# Patient Record
Sex: Female | Born: 1955 | Race: White | Hispanic: No | Smoking: Never smoker
Health system: Southern US, Community
[De-identification: ages and names within clinical notes are randomized; demographics above are authoritative.]

## PROBLEM LIST (undated history)

## (undated) ENCOUNTER — Emergency Department (HOSPITAL_COMMUNITY): Payer: Self-pay

## (undated) DIAGNOSIS — N186 End stage renal disease: Secondary | ICD-10-CM

## (undated) DIAGNOSIS — G4730 Sleep apnea, unspecified: Secondary | ICD-10-CM

## (undated) DIAGNOSIS — M1990 Unspecified osteoarthritis, unspecified site: Secondary | ICD-10-CM

## (undated) DIAGNOSIS — I2510 Atherosclerotic heart disease of native coronary artery without angina pectoris: Secondary | ICD-10-CM

## (undated) DIAGNOSIS — K219 Gastro-esophageal reflux disease without esophagitis: Secondary | ICD-10-CM

## (undated) DIAGNOSIS — I10 Essential (primary) hypertension: Secondary | ICD-10-CM

## (undated) DIAGNOSIS — D649 Anemia, unspecified: Secondary | ICD-10-CM

## (undated) DIAGNOSIS — J45909 Unspecified asthma, uncomplicated: Secondary | ICD-10-CM

## (undated) DIAGNOSIS — I739 Peripheral vascular disease, unspecified: Secondary | ICD-10-CM

## (undated) DIAGNOSIS — E7800 Pure hypercholesterolemia, unspecified: Secondary | ICD-10-CM

## (undated) DIAGNOSIS — I639 Cerebral infarction, unspecified: Secondary | ICD-10-CM

## (undated) DIAGNOSIS — N184 Chronic kidney disease, stage 4 (severe): Secondary | ICD-10-CM

## (undated) DIAGNOSIS — Z87442 Personal history of urinary calculi: Secondary | ICD-10-CM

## (undated) DIAGNOSIS — Z8619 Personal history of other infectious and parasitic diseases: Secondary | ICD-10-CM

## (undated) DIAGNOSIS — E119 Type 2 diabetes mellitus without complications: Secondary | ICD-10-CM

## (undated) DIAGNOSIS — I509 Heart failure, unspecified: Secondary | ICD-10-CM

## (undated) DIAGNOSIS — Z992 Dependence on renal dialysis: Secondary | ICD-10-CM

## (undated) HISTORY — PX: TUBAL LIGATION: SHX77

## (undated) HISTORY — PX: SHOULDER OPEN ROTATOR CUFF REPAIR: SHX2407

## (undated) HISTORY — PX: HERNIA REPAIR: SHX51

## (undated) HISTORY — DX: Heart failure, unspecified: I50.9

---

## 2013-06-28 ENCOUNTER — Inpatient Hospital Stay (HOSPITAL_COMMUNITY)
Admission: AD | Admit: 2013-06-28 | Discharge: 2013-06-30 | DRG: 554 | Disposition: A | Discharge status: Home / Self Care | Payer: Self-pay | Source: Other Acute Inpatient Hospital | Attending: Internal Medicine | Admitting: Internal Medicine

## 2013-06-28 ENCOUNTER — Encounter (HOSPITAL_COMMUNITY): Payer: Self-pay | Admitting: Internal Medicine

## 2013-06-28 ENCOUNTER — Inpatient Hospital Stay (HOSPITAL_COMMUNITY): Payer: Self-pay

## 2013-06-28 DIAGNOSIS — M1990 Unspecified osteoarthritis, unspecified site: Principal | ICD-10-CM | POA: Diagnosis present

## 2013-06-28 DIAGNOSIS — N289 Disorder of kidney and ureter, unspecified: Secondary | ICD-10-CM

## 2013-06-28 DIAGNOSIS — I129 Hypertensive chronic kidney disease with stage 1 through stage 4 chronic kidney disease, or unspecified chronic kidney disease: Secondary | ICD-10-CM | POA: Diagnosis present

## 2013-06-28 DIAGNOSIS — E1110 Type 2 diabetes mellitus with ketoacidosis without coma: Secondary | ICD-10-CM

## 2013-06-28 DIAGNOSIS — Z6837 Body mass index (BMI) 37.0-37.9, adult: Secondary | ICD-10-CM

## 2013-06-28 DIAGNOSIS — N189 Chronic kidney disease, unspecified: Secondary | ICD-10-CM | POA: Diagnosis present

## 2013-06-28 DIAGNOSIS — E1310 Other specified diabetes mellitus with ketoacidosis without coma: Secondary | ICD-10-CM

## 2013-06-28 DIAGNOSIS — M25569 Pain in unspecified knee: Secondary | ICD-10-CM | POA: Diagnosis present

## 2013-06-28 DIAGNOSIS — IMO0002 Reserved for concepts with insufficient information to code with codable children: Secondary | ICD-10-CM | POA: Diagnosis present

## 2013-06-28 DIAGNOSIS — M898X9 Other specified disorders of bone, unspecified site: Secondary | ICD-10-CM | POA: Diagnosis present

## 2013-06-28 DIAGNOSIS — E669 Obesity, unspecified: Secondary | ICD-10-CM | POA: Diagnosis present

## 2013-06-28 DIAGNOSIS — E119 Type 2 diabetes mellitus without complications: Secondary | ICD-10-CM | POA: Diagnosis present

## 2013-06-28 DIAGNOSIS — E1165 Type 2 diabetes mellitus with hyperglycemia: Secondary | ICD-10-CM

## 2013-06-28 DIAGNOSIS — M79609 Pain in unspecified limb: Secondary | ICD-10-CM

## 2013-06-28 DIAGNOSIS — E785 Hyperlipidemia, unspecified: Secondary | ICD-10-CM | POA: Diagnosis present

## 2013-06-28 DIAGNOSIS — I739 Peripheral vascular disease, unspecified: Secondary | ICD-10-CM | POA: Diagnosis present

## 2013-06-28 HISTORY — DX: Essential (primary) hypertension: I10

## 2013-06-28 LAB — HEPARIN LEVEL (UNFRACTIONATED)
Heparin Unfractionated: 0.4 IU/mL (ref 0.30–0.70)
Heparin Unfractionated: 0.47 IU/mL (ref 0.30–0.70)

## 2013-06-28 LAB — CBC
HCT: 30.1 % — ABNORMAL LOW (ref 36.0–46.0)
Hemoglobin: 10.5 g/dL — ABNORMAL LOW (ref 12.0–15.0)
MCH: 31.3 pg (ref 26.0–34.0)
MCHC: 34.9 g/dL (ref 30.0–36.0)
MCV: 89.9 fL (ref 78.0–100.0)
Platelets: 348 10*3/uL (ref 150–400)
RBC: 3.35 MIL/uL — ABNORMAL LOW (ref 3.87–5.11)
RDW: 14.9 % (ref 11.5–15.5)
WBC: 8.4 10*3/uL (ref 4.0–10.5)

## 2013-06-28 LAB — BASIC METABOLIC PANEL
BUN: 48 mg/dL — ABNORMAL HIGH (ref 6–23)
CO2: 19 mEq/L (ref 19–32)
Calcium: 9.1 mg/dL (ref 8.4–10.5)
Chloride: 106 mEq/L (ref 96–112)
Creatinine, Ser: 2.1 mg/dL — ABNORMAL HIGH (ref 0.50–1.10)
GFR calc Af Amer: 29 mL/min — ABNORMAL LOW (ref >90)
GFR calc non Af Amer: 25 mL/min — ABNORMAL LOW (ref >90)
Glucose, Bld: 119 mg/dL — ABNORMAL HIGH (ref 70–99)
Potassium: 4.4 mEq/L (ref 3.5–5.1)
Sodium: 137 mEq/L (ref 135–145)

## 2013-06-28 LAB — GLUCOSE, CAPILLARY
Glucose-Capillary: 140 mg/dL — ABNORMAL HIGH (ref 70–99)
Glucose-Capillary: 110 mg/dL — ABNORMAL HIGH (ref 70–99)
Glucose-Capillary: 107 mg/dL — ABNORMAL HIGH (ref 70–99)
Glucose-Capillary: 118 mg/dL — ABNORMAL HIGH (ref 70–99)
Glucose-Capillary: 162 mg/dL — ABNORMAL HIGH (ref 70–99)

## 2013-06-28 LAB — HEMOGLOBIN A1C
Hgb A1c MFr Bld: 6.6 % — ABNORMAL HIGH (ref <5.7)
Mean Plasma Glucose: 143 mg/dL — ABNORMAL HIGH (ref <117)

## 2013-06-28 MED ORDER — DOCUSATE SODIUM 100 MG PO CAPS
100.0000 mg | ORAL_CAPSULE | Freq: Two times a day (BID) | ORAL | Status: DC
  Administered 2013-06-29 – 2013-06-30 (×3): 100 mg via ORAL
  Filled 2013-06-28 (×4): qty 1

## 2013-06-28 MED ORDER — ONDANSETRON HCL 4 MG/2ML IJ SOLN: 4.0000 mg | Freq: Four times a day (QID) | INTRAMUSCULAR | Status: DC | PRN

## 2013-06-28 MED ORDER — METOPROLOL TARTRATE 25 MG PO TABS
25.0000 mg | ORAL_TABLET | Freq: Two times a day (BID) | ORAL | Status: DC
  Administered 2013-06-28 – 2013-06-30 (×5): 25 mg via ORAL
  Filled 2013-06-28 (×6): qty 1

## 2013-06-28 MED ORDER — DEXTROSE-NACL 5-0.9 % IV SOLN
INTRAVENOUS | Status: DC
  Administered 2013-06-28 – 2013-06-29 (×2): via INTRAVENOUS

## 2013-06-28 MED ORDER — HEPARIN (PORCINE) IN NACL 100-0.45 UNIT/ML-% IJ SOLN
1300.0000 [IU]/h | INTRAMUSCULAR | Status: DC
  Administered 2013-06-28 – 2013-06-29 (×2): 1300 [IU]/h via INTRAVENOUS
  Filled 2013-06-28 (×3): qty 250

## 2013-06-28 MED ORDER — HYDROMORPHONE HCL PF 1 MG/ML IJ SOLN
1.0000 mg | INTRAMUSCULAR | Status: DC | PRN
  Administered 2013-06-28 – 2013-06-29 (×3): 1 mg via INTRAVENOUS
  Filled 2013-06-28 (×3): qty 1

## 2013-06-28 MED ORDER — ONDANSETRON HCL 4 MG PO TABS: 4.0000 mg | ORAL_TABLET | Freq: Four times a day (QID) | ORAL | Status: DC | PRN

## 2013-06-28 MED ORDER — ASPIRIN EC 81 MG PO TBEC
81.0000 mg | DELAYED_RELEASE_TABLET | Freq: Every day | ORAL | Status: DC
  Administered 2013-06-28 – 2013-06-30 (×3): 81 mg via ORAL
  Filled 2013-06-28 (×3): qty 1

## 2013-06-28 MED ORDER — INSULIN ASPART 100 UNIT/ML ~~LOC~~ SOLN
0.0000 [IU] | SUBCUTANEOUS | Status: DC
  Administered 2013-06-28: 2 [IU] via SUBCUTANEOUS

## 2013-06-28 MED ORDER — ATORVASTATIN CALCIUM 40 MG PO TABS
40.0000 mg | ORAL_TABLET | Freq: Every day | ORAL | Status: DC
  Administered 2013-06-28 – 2013-06-29 (×2): 40 mg via ORAL
  Filled 2013-06-28 (×3): qty 1

## 2013-06-28 NOTE — Care Management Note (Unsigned)
    Page 1 of 1   06/29/2013     3:29:54 PM   CARE MANAGEMENT NOTE 06/29/2013  Patient:  ALETTE, SCHAFER PENDELL   Account Number:  [id number]  Date Initiated:  06/28/2013  Documentation initiated by:  Zaneta Lightcap  Subjective/Objective Assessment:   PT ADM ON 06/28/13 WITH ARTERIAL OCCLUSION.  PTA, PT INDEPENDENT, LIVES WITH FAMILY MEMBERS.     Action/Plan:   WILL FOLLOW FOR DISCHARGE NEEDS AS PT PROGRESSES.   Anticipated DC Date:  07/01/2013   Anticipated DC Plan:  [place]  CM consult      Choice offered to / List presented to:             Status of service:  In process, will continue to follow Medicare Important Message given?   (If response is "NO", the following Medicare IM given date fields will be blank) Date Medicare IM given:   Date Additional Medicare IM given:    Discharge Disposition:    Per UR Regulation:  Reviewed for med. necessity/level of care/duration of stay  If discussed at [place] of Stay Meetings, dates discussed:    Comments:  06/29/13 Nafisa Olds,RN,BSN [id number] MET WITH PT TO [place].  PT REQUESTS RW FOR HOME.  WILL OBTAIN ORDER FROM MD AND REFER TO [place] FOR DME NEEDS.

## 2013-06-28 NOTE — Consult Note (Addendum)
[facility] NOTE   MRN : [id number]  Reason for Consult: left knee pain that radiates to left calf.  R/O claudication Referring Physician: Orvan Falconer, MD   History of Present Illness: Kristen Berry is an 57 y.o. female with hx of DM2, previously on Metformin but stopped due to "kidney problem", HTN, obesity px cc: acute onset of severe L knee pain.     She has had left leg pain that started 3 weeks ago.  Her sx is consistent with short distance intermittent claudication.  She had sudden onset of severe pain behind her left knee yesterday while standing.  The pain was so severe yesterday that she was unable to bear weight on it and she went to the [facility] ER.  She had a venous doppler performed and there is no evidence of DVT, but questionable arterial occlusion.  The report was not scanned in the chart.  The patient states her worse pain is in her knee and the pain radiates to her lower leg.  She does not have classic symptoms of claudication.  We have been asked to consult on her and rule out PAD/occlusive disease.  Past Medical History  Diagnosis Date  . Hypertension   . Chronic kidney disease    Past surgical history:Tubal ligation, left rotator cuff repair  Social History History  Substance Use Topics  . Smoking status: Never Smoker   . Smokeless tobacco: Not on file  . Alcohol Use: No   Family History Father DM, HTN Mother CAD  Outpatient Medications  She does not know the name of her current medications and will have family bring a list.   Current Facility-Administered Medications  Medication Dose Route Frequency Provider Last Rate Last Dose  . aspirin EC tablet 81 mg  81 mg Oral Daily Orvan Falconer, MD      . atorvastatin (LIPITOR) tablet 40 mg  40 mg Oral q1800 Orvan Falconer, MD      . dextrose 5 %-0.9 % sodium chloride infusion   Intravenous Continuous Orvan Falconer, MD 50 mL/hr at 06/28/13 0534    . docusate sodium (COLACE) capsule 100  mg  100 mg Oral BID Orvan Falconer, MD      . heparin ADULT infusion 100 units/mL (25000 units/250 mL)  1,300 Units/hr Intravenous Continuous Rogue Bussing, [facility] 13 mL/hr at 06/28/13 0534 1,300 Units/hr at 06/28/13 0534  . HYDROmorphone (DILAUDID) injection 1 mg  1 mg Intravenous Q2H PRN Orvan Falconer, MD      . insulin aspart (novoLOG) injection 0-15 Units  0-15 Units Subcutaneous Q4H Orvan Falconer, MD   2 Units at 06/28/13 0545  . metoprolol tartrate (LOPRESSOR) tablet 25 mg  25 mg Oral BID Orvan Falconer, MD      . ondansetron [facility]) tablet 4 mg  4 mg Oral Q6H PRN Orvan Falconer, MD       Or  . ondansetron [facility]) injection 4 mg  4 mg Intravenous Q6H PRN Orvan Falconer, MD      Pt meds include: Statin :No Betablocker: No ASA: No Other anticoagulants/antiplatelets: no  Allergies: NKDA  REVIEW OF SYSTEMS  General: [ ]  Weight loss, [ ]  Fever, [ ]  chills Neurologic: [ ]  Dizziness, [ ]  Blackouts, [ ]  Seizure, [ ]  Stroke, [ ]  "Mini stroke", [ ]  Slurred speech, [ ]  Temporary blindness; [ ]  weakness in arms or legs, [ ]  Hoarseness [ ]  Dysphagia Cardiac: [ ]  Chest pain/pressure, [ ]  Shortness of breath  at rest [ ]  Shortness of breath with exertion, [ ]  Atrial fibrillation or irregular heartbeat  Vascular: [x ] Pain in legs with walking, [ ]  Pain in legs at rest, [ ]  Pain in legs at night,  [ ]  Non-healing ulcer, [ ]  Blood clot in vein/DVT,   Pulmonary: [ ]  Home oxygen, [x ] Productive cough, [ ]  Coughing up blood, [ ]  Asthma, [ ]  Wheezing [ ]  COPD Musculoskeletal:  [ ]  Arthritis, [ ]  Low back pain, [x ] Joint pain Hematologic: [x ] Easy Bruising, [ ]  Anemia; [ ]  Hepatitis Gastrointestinal: [ ]  Blood in stool, [ ]  Gastroesophageal Reflux/heartburn, Urinary: [x ] chronic Kidney disease ?, [ ]  on HD - [ ]  MWF or [ ]  TTHS, [ ]  Burning with urination, [ ]  Difficulty urinating Skin: [ ]  Rashes, [x ] Wounds superficial scratches. Psychological: [ ]  Anxiety, [ ]  Depression  Physical Examination Filed Vitals:   06/28/13  0410 06/28/13 0412  BP: 184/51 159/51  Pulse: 99   Temp: 97.4 F (36.3 C)   TempSrc: Oral   Resp: 17   Height: 5\' 7"  (1.702 m)   Weight: 239 lb (108.41 kg)   SpO2: 96%    Body mass index is 37.42 kg/(m^2).  General:  WDWN in NAD, obese  Gait: unable secondary to left knee pain Head: Vance/AT Eyes: PERRLA, EOMI ENT: WNL, oroph. Without erythema or exudate, no cervical LAD Pulmonary: normal non-labored breathing , without Rales, rhonchi,  wheezing Cardiac: RRR, without  Murmurs, rubs or gallops; No carotid bruits Abdomen: soft, NT, no masses Skin: no rashes, ulcers noted;  no Gangrene , no cellulitis;  Superficial  wounds;  Vascular Exam/Pulses:Radial, brachial, femoral palpable pulses. Doppler DP and PT signal bilateral Musculoskeletal: no muscle wasting or atrophy; no edema, No erythema or point tenderness, generalized left knee pain. Neurologic: A&O X 3; Appropriate Affect ; SENSATION: normal; MOTOR FUNCTION: 5/5 Symmetric, except weakness in left knee secondary to pain with resistive AROM. Speech is fluent/normal Lymph: no femoral or axillary LAD Psych: judgment intact, mood appropriate for clinical situation  Laboratory: CBC: No results found for this basename: wbc, rbc, hgb, hct, plt, mcv, mch, mchc, rdw, neutrabs, lymphsabs, monoabs, eosabs, basosabs    BMP:    Component Value Date/Time   NA 137 06/28/2013 0830   K 4.4 06/28/2013 0830   CL 106 06/28/2013 0830   CO2 19 06/28/2013 0830   GLUCOSE 119* 06/28/2013 0830   BUN 48* 06/28/2013 0830   CREATININE 2.10* 06/28/2013 0830   CALCIUM 9.1 06/28/2013 0830   GFRNONAA 25* 06/28/2013 0830   GFRAA 29* 06/28/2013 0830    Coagulation: No results found for this basename: INR, PROTIME   No results found for this basename: PTT   Radiology: Dg Knee 2 Views Left  06/28/2013   CLINICAL DATA:  Unable to bear weight  EXAM: LEFT KNEE - 1-2 VIEW  COMPARISON:  None.  FINDINGS: Frontal and lateral views were obtained. There is no fracture,  dislocation, or effusion. There is moderate narrowing medially and in the patellofemoral joint region. There is mild spurring laterally and arising from the posterior patella. No erosive change.  IMPRESSION: Osteoarthritis. No fracture or effusion.   Electronically Signed   By: Lowella Grip   On: 06/28/2013 09:19   Non-Invasive Vascular Imaging: Pending ABI's  ASSESSMENT/PLAN:  Left knee pain of unknown cause , BLE intermittent claudication  Doppler signals intact.  We will order ABI's and left knee x-ray for further work  up.  Pending labs.  Laurence Slate [facility] 06/28/2013 7:40 AM  Addendum  I have independently interviewed and examined the patient, and I agree with the physician assistant's findings.  The patient's findings are consistent with intermittent claudication.  The patient has no evidence of threatened limb with intact neuro function and dopplerable signals in both feet.  The chronology and findings in this patient are not consistent with her intermittent claudication as the cause of her acute left knee pain.  I think screening for possible embolism and popliteal aneurysm with a left arterial duplex is not unreasonable along with the ABI.  If the non-invasive studies are not diagnostic, pt might need an ORTHO evaluation for a non-vascular etiology.  Adele Barthel, MD [facility] [place] Office: [phone] Pager: [phone]  06/28/2013, 11:26 AM

## 2013-06-28 NOTE — Progress Notes (Signed)
Utilization Review Completed.Donne Anon T10/10/2012

## 2013-06-28 NOTE — Progress Notes (Signed)
ANTICOAGULATION CONSULT NOTE - Follow Up Consult  Pharmacy Consult for Heparin  Indication: r/o arterial occlusion   No Known Allergies  Patient Measurements: Height: 5\' 7"  (170.2 cm) Weight: 239 lb (108.41 kg) IBW/kg (Calculated) : 61.6 Heparin Dosing Weight: 90 kg  Vital Signs: Temp: 97.4 F (36.3 C) (10/02 0410) Temp src: Oral (10/02 0410) BP: 157/35 mmHg (10/02 1044) Pulse Rate: 79 (10/02 1044)  Labs:  Recent Labs  06/28/13 0830 06/28/13 1446  HGB  --  10.5*  HCT  --  30.1*  PLT  --  348  HEPARINUNFRC  --  0.40  CREATININE 2.10*  --     Estimated Creatinine Clearance: 37 ml/min (by C-G formula based on Cr of 2.1).   Medications:  -Heparin at 1300 units/hr  Assessment: 57 y/o F on heparin drip for r/o arterial occlusion. HL is 0.40 on 1300 units/hr of heparin. Hgb 10.5, Plts 348, Scr 2.10 with CrCl ~35-40, no overt bleeding noted.   Goal of Therapy:  Heparin level 0.3-0.7 units/ml Monitor platelets by anticoagulation protocol: Yes   Plan:  -Continue heparin at 1300 units/hr -8 hour HL at 2300 -Daily CBC/HL -Monitor for bleeding -F/U VVS plans  Thank you for allowing me to take part in this patient's care,  Narda Bonds, PharmD Clinical Pharmacist Phone: [phone] Pager: [phone] 06/28/2013 3:26 PM

## 2013-06-28 NOTE — Progress Notes (Addendum)
VASCULAR LAB PRELIMINARY  PRELIMINARY  PRELIMINARY  PRELIMINARY  Left lower extremity duplex completed.    Preliminary report:  Left: Monophasic flow from the CFA throughout.  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  Strong collateral at this level.  Reconstitution in the distal FA with a mild stenosis.  Popliteal artery patent with no aneurysm.  Right:  CFA monophasic.  Mickael Mcnutt, RVT 06/28/2013, 2:59 PM  ABI completed:    RIGHT    LEFT    PRESSURE WAVEFORM  PRESSURE WAVEFORM  BRACHIAL 221 Triphasic  BRACHIAL 186 Triphasic   DP   DP    AT 144 Biphasic   AT 91 Monophasic  PT 119 Monophasic  PT 49--may be higher, barely audible  Monophasic   PER   PER    GREAT TOE  NA GREAT TOE  NA    RIGHT LEFT  ABI 0.65 0.41     Daralyn Bert, RVT 06/29/2013, 11:42 AM

## 2013-06-28 NOTE — H&P (Signed)
[facility] History and Physical  Kristen Berry [id number] DOB: October 21, 1955    PCP:   Rise Patience., MD   Chief Complaint: acute left lower leg pain 3 weeks ago.  HPI: Kristen Berry is an 57 y.o. female with hx of DM2, previously on Metformin but stopped due to "kidney problem", HTN, obesity, presents to [facility] ER complaining of sudden onset of left lower leg pain acutely 3 weeks ago while walking in a grocery store.  Since then, pain has been persisting, worse on ambulation, but also having rest pain.  She didn't have any lower back or buttock pain.  There has been no fever or chills.  She said her pain was mild to moderate, but tonight it was more severe.  There has been no chest pain.  She went to [facility] ER, where a venous doppler was done showing no DVT, but querry arterial occlusion.  Other study there included an EKG showing NSR and unremarkable.  Her Cr was elevated to 2.0 with BUN 47.  Her WBC was 11K, Hb 11.5 grams per DL. Her BS was 231, K 5.3 and Bicarb of 16.  Dr Bridgett Larsson of vascular surgery was consulted, recommended out patient follow up if able to control pain.  Hospitalist was requested to accept her in transfers for inpatient treatment.  Doppler of her leg showed the presence of both DP and PT pulses.  Rewiew of Systems:  Constitutional: Negative for malaise, fever and chills. No significant weight loss or weight gain Eyes: Negative for eye pain, redness and discharge, diplopia, visual changes, or flashes of light. ENMT: Negative for ear pain, hoarseness, nasal congestion, sinus pressure and sore throat. No headaches; tinnitus, drooling, or problem swallowing. Cardiovascular: Negative for chest pain, palpitations, diaphoresis, dyspnea and peripheral edema. ; No orthopnea, PND Respiratory: Negative for cough, hemoptysis, wheezing and stridor. No pleuritic chestpain. Gastrointestinal: Negative for nausea, vomiting, diarrhea, constipation,  abdominal pain, melena, blood in stool, hematemesis, jaundice and rectal bleeding.    Genitourinary: Negative for frequency, dysuria, incontinence,flank pain and hematuria; Musculoskeletal: Negative for back pain and neck pain. Negative for swelling and trauma.;  Skin: . Negative for pruritus, rash, abrasions, bruising and skin lesion.; ulcerations Neuro: Negative for headache, lightheadedness and neck stiffness. Negative for weakness, altered level of consciousness , altered mental status, extremity weakness, burning feet, involuntary movement, seizure and syncope.  Psych: negative for anxiety, depression, insomnia, tearfulness, panic attacks, hallucinations, paranoia, suicidal or homicidal ideation    Past Medical History  Diagnosis Date  . Hypertension   . Chronic kidney disease     No past surgical history on file.  Medications:  HOME MEDS: Prior to Admission medications   Not on File     Allergies:  Allergies not on file  Social History:   reports that she has never smoked. She does not have any smokeless tobacco history on file. She reports that she does not drink alcohol. Her drug history is not on file.  Family History: No family history on file.   Physical Exam: Filed Vitals:   06/28/13 0410 06/28/13 0412  BP: 184/51 159/51  Pulse: 99   Temp: 97.4 F (36.3 C)   TempSrc: Oral   Resp: 17   Height: 5\' 7"  (1.702 m)   Weight: 108.41 kg (239 lb)   SpO2: 96%    Blood pressure 159/51, pulse 99, temperature 97.4 F (36.3 C), temperature source Oral, resp. rate 17, height 5\' 7"  (1.702 m), weight 108.41 kg (239 lb),  SpO2 96.00%.  GEN:  Pleasant  patient lying in the stretcher in no acute distress; cooperative with exam. PSYCH:  alert and oriented x4; does not appear anxious or depressed; affect is appropriate. HEENT: Mucous membranes pink and anicteric; PERRLA; EOM intact; no cervical lymphadenopathy nor thyromegaly or carotid bruit; no JVD; There were no stridor.  Neck is very supple. Breasts:: Not examined CHEST WALL: No tenderness CHEST: Normal respiration, clear to auscultation bilaterally.  HEART: Regular rate and rhythm.  There are no murmur, rub, or gallops.   BACK: No kyphosis or scoliosis; no CVA tenderness ABDOMEN: soft and non-tender; no masses, no organomegaly, normal abdominal bowel sounds; no pannus; no intertriginous candida. There is no rebound and no distention. Rectal Exam: Not done EXTREMITIES: No bone or joint deformity; age-appropriate arthropathy of the hands and knees; no edema; no ulcerations.  She has dorsalis pedis by my exam.  Her leg is warm except at the very tips of her left toes, a little cool, but not cold.  No discoloration. Genitalia: not examined PULSES: 2+ and symmetric SKIN: Normal hydration no rash or ulceration CNS: Cranial nerves 2-12 grossly intact no focal lateralizing neurologic deficit.  Speech is fluent; uvula elevated with phonation, facial symmetry and tongue midline. DTR are normal bilaterally, cerebella exam is intact, barbinski is negative and strengths are equaled bilaterally.  No sensory loss.   Labs on Admission:  See above, Labs done at [facility] ER, not repeated.  Radiological Exams on Admission: No results found.  EKG: Independently reviewed. Done at [employer], [place], no acute ST-T changes.   Assessment/Plan Present on Admission:  . Renal insufficiency . Other and unspecified hyperlipidemia . Obesity, unspecified . Pain in limb . Type II or unspecified type diabetes mellitus with unspecified complication, uncontrolled  PLAN:  It is possible that she has arterial occlusion, picked up by venous doppler of her lower leg.  She is not having severe pain, and her left foot is quite warm, but toes are a little cool.  Her symptoms have been present for at least 3 weeks, though it was abrupt when it started 3 weeks ago.  Her elevated Cr also accompanied with elevated BUN as well, along with her being on  diuretic made we wonder if she is volume depleted.  Will admit her and start IVF.  Follow Cr. I will stop her Zesteretic.  Will start her on IV Heparin as she has no risk of bleeding.  She needs pain medication. Will notify Dr Bridgett Larsson of her admission.  I have continue her lopressor and her statin.  She is made NPO, given D5 NS and will use SSI for elevated BS.  She is stable, full code, and will be admitted to [facility] service.  Thank you for allowing to participate in the care of this nice patient.  Other plans as per orders.  Code Status: FULL Haskel Khan, MD. [facility] Pager [phone] 7pm to 7am.  06/28/2013, 5:01 AM

## 2013-06-28 NOTE — Progress Notes (Signed)
Patient admitted early this AM by Dr. Einar Crow see H&P.  No labs here.  Will get basic labs and await vascular surgery consultation.  Eulogio Bear DO

## 2013-06-28 NOTE — Progress Notes (Signed)
ANTICOAGULATION CONSULT NOTE - Initial Consult  Pharmacy Consult for heparin Indication: r/o arterial occlusion  Patient Measurements: Height: 5\' 7"  (170.2 cm) Weight: 239 lb (108.41 kg) IBW/kg (Calculated) : 61.6 Heparin Dosing Weight: 90kg  Vital Signs: Temp: 97.4 F (36.3 C) (10/02 0410) Temp src: Oral (10/02 0410) BP: 159/51 mmHg (10/02 0412) Pulse Rate: 99 (10/02 0410)   Medical History: Past Medical History  Diagnosis Date  . Hypertension   . Chronic kidney disease     Medications:  Scheduled:  . aspirin EC  81 mg Oral Daily  . atorvastatin  40 mg Oral q1800  . docusate sodium  100 mg Oral BID  . insulin aspart  0-15 Units Subcutaneous Q4H  . metoprolol tartrate  25 mg Oral BID    Assessment: 57yo female comes from [facility] for leg pain, possibly d/t arterial occlusion, to continue gtt already started. Goal of Therapy:  Heparin level 0.3-0.7 units/ml Monitor platelets by anticoagulation protocol: Yes   Plan:  Started on heparin 1100 units/hr at OSH without bolus, gtt has been off x~97min since arriving to [facility]; will resume at 1300 units/hr and monitor heparin levels and CBC.  Wynona Neat, PharmD, BCPS  06/28/2013,4:59 AM

## 2013-06-29 DIAGNOSIS — M79609 Pain in unspecified limb: Secondary | ICD-10-CM

## 2013-06-29 DIAGNOSIS — E785 Hyperlipidemia, unspecified: Secondary | ICD-10-CM

## 2013-06-29 DIAGNOSIS — I739 Peripheral vascular disease, unspecified: Secondary | ICD-10-CM

## 2013-06-29 LAB — BASIC METABOLIC PANEL
BUN: 44 mg/dL — ABNORMAL HIGH (ref 6–23)
CO2: 20 mEq/L (ref 19–32)
Calcium: 9.2 mg/dL (ref 8.4–10.5)
Chloride: 106 mEq/L (ref 96–112)
Creatinine, Ser: 2.13 mg/dL — ABNORMAL HIGH (ref 0.50–1.10)
GFR calc Af Amer: 28 mL/min — ABNORMAL LOW (ref >90)
GFR calc non Af Amer: 24 mL/min — ABNORMAL LOW (ref >90)
Glucose, Bld: 132 mg/dL — ABNORMAL HIGH (ref 70–99)
Potassium: 4.9 mEq/L (ref 3.5–5.1)
Sodium: 137 mEq/L (ref 135–145)

## 2013-06-29 LAB — CBC
HCT: 33.3 % — ABNORMAL LOW (ref 36.0–46.0)
Hemoglobin: 10.9 g/dL — ABNORMAL LOW (ref 12.0–15.0)
MCH: 30.1 pg (ref 26.0–34.0)
MCHC: 32.7 g/dL (ref 30.0–36.0)
MCV: 92 fL (ref 78.0–100.0)
Platelets: 425 10*3/uL — ABNORMAL HIGH (ref 150–400)
RBC: 3.62 MIL/uL — ABNORMAL LOW (ref 3.87–5.11)
RDW: 15 % (ref 11.5–15.5)
WBC: 9.7 10*3/uL (ref 4.0–10.5)

## 2013-06-29 LAB — GLUCOSE, CAPILLARY: Glucose-Capillary: 121 mg/dL — ABNORMAL HIGH (ref 70–99)

## 2013-06-29 LAB — HEPARIN LEVEL (UNFRACTIONATED): Heparin Unfractionated: 0.51 IU/mL (ref 0.30–0.70)

## 2013-06-29 MED ORDER — LIDOCAINE HCL (PF) 1 % IJ SOLN
5.0000 mL | Freq: Once | INTRAMUSCULAR | Status: AC
  Administered 2013-06-29: 5 mL
  Filled 2013-06-29: qty 5

## 2013-06-29 MED ORDER — HYDROCODONE-ACETAMINOPHEN 5-325 MG PO TABS
1.0000 | ORAL_TABLET | ORAL | Status: DC | PRN
  Administered 2013-06-29: 2 via ORAL
  Filled 2013-06-29: qty 2

## 2013-06-29 MED ORDER — HYDRALAZINE HCL 20 MG/ML IJ SOLN
10.0000 mg | Freq: Four times a day (QID) | INTRAMUSCULAR | Status: DC | PRN
  Administered 2013-06-29: 10 mg via INTRAVENOUS
  Filled 2013-06-29 (×2): qty 1

## 2013-06-29 MED ORDER — TRIAMCINOLONE ACETONIDE 40 MG/ML IJ SUSP
40.0000 mg | Freq: Once | INTRAMUSCULAR | Status: AC
  Administered 2013-06-29: 40 mg via INTRA_ARTICULAR
  Filled 2013-06-29: qty 1

## 2013-06-29 MED ORDER — LIDOCAINE 5 % EX PTCH
1.0000 | MEDICATED_PATCH | CUTANEOUS | Status: DC
  Administered 2013-06-29 – 2013-06-30 (×2): 1 via TRANSDERMAL
  Filled 2013-06-29 (×3): qty 1

## 2013-06-29 MED ORDER — HEPARIN SODIUM (PORCINE) 5000 UNIT/ML IJ SOLN
5000.0000 [IU] | Freq: Three times a day (TID) | INTRAMUSCULAR | Status: DC
  Administered 2013-06-29 – 2013-06-30 (×3): 5000 [IU] via SUBCUTANEOUS
  Filled 2013-06-29 (×6): qty 1

## 2013-06-29 MED ORDER — BUPIVACAINE HCL (PF) 0.5 % IJ SOLN
30.0000 mL | Freq: Once | INTRAMUSCULAR | Status: AC
  Administered 2013-06-29: 30 mL
  Filled 2013-06-29: qty 30

## 2013-06-29 MED ORDER — BUPIVACAINE HCL (PF) 0.5 % IJ SOLN
20.0000 mL | Freq: Once | INTRAMUSCULAR | Status: DC
  Filled 2013-06-29: qty 20

## 2013-06-29 NOTE — Progress Notes (Signed)
ANTICOAGULATION CONSULT NOTE - Follow Up Consult  Pharmacy Consult for Heparin  Indication: r/o arterial occlusion   No Known Allergies  Patient Measurements: Height: 5\' 7"  (170.2 cm) Weight: 239 lb (108.41 kg) IBW/kg (Calculated) : 61.6 Heparin Dosing Weight: 90 kg  Vital Signs: Temp: 97.5 F (36.4 C) (10/02 2016) Temp src: Oral (10/02 2016) BP: 138/56 mmHg (10/02 2016) Pulse Rate: 72 (10/02 2016)  Labs:  Recent Labs  06/28/13 0830 06/28/13 1446 06/28/13 2330  HGB  --  10.5*  --   HCT  --  30.1*  --   PLT  --  348  --   HEPARINUNFRC  --  0.40 0.47  CREATININE 2.10*  --   --     Estimated Creatinine Clearance: 37 ml/min (by C-G formula based on Cr of 2.1).   Medications:  -Heparin at 1300 units/hr  Assessment: 57 y/o F on heparin drip for r/o arterial occlusion. HL is 0.47 on 1300 units/hr of heparin. Hgb 10.5, Plts 348, Scr 2.10 with CrCl ~35-40, no overt bleeding noted.   Goal of Therapy:  Heparin level 0.3-0.7 units/ml Monitor platelets by anticoagulation protocol: Yes   Plan:  -Continue heparin at 1300 units/hr -f/u daily CBC/HL -Monitor for bleeding -F/U VVS plans  Thank you for allowing me to take part in this patient's care,  Excell Seltzer, PharmD Clinical Pharmacist Phone: [phone] 06/29/2013 12:00 AM

## 2013-06-29 NOTE — Progress Notes (Addendum)
[facility] PROGRESS NOTE  Kristen Berry [id number] DOB: 17-Nov-1955 DOA: 06/28/2013 PCP: No primary provider on file.  Assessment/Plan: Left knee pain- appears to osteoarthritis- was suspicious for arterial clot but seen by vascular: LLE arterial duplex more consistent with CHRONIC disease collateral development already evident. I doubt her intermittent claudication caused her acute pain in Left leg. Intermittent claudication is managed by maximal medical management including walk plan initially. She follow up in the office in 3 months for continued surveillance. I would obtain the ABI before discharge to set her baseline. -will add lidocaine patch -change to oral pain meds -asked ortho to see -try shot of cortisone  Renal insuff  Obesity    Code Status: full Family Communication: patient Disposition Plan: ?d/c soon   Consultants: Vascular  Procedures:    Antibiotics:    HPI/Subjective: Up walking to bathroom Knee hurts with palpation and weight bearing  Objective: Filed Vitals:   06/29/13 0444  BP: 137/50  Pulse: 79  Temp: 97.5 F (36.4 C)  Resp: 18    Intake/Output Summary (Last 24 hours) at 06/29/13 0907 Last data filed at 06/29/13 0730  Gross per 24 hour  Intake    720 ml  Output      0 ml  Net    720 ml   Filed Weights   06/28/13 0410  Weight: 108.41 kg (239 lb)    Exam:   General:  A+Ox3, NAD  Cardiovascular: rrr  Respiratory: clear anterior  Abdomen: +Bs, soft  Musculoskeletal: all ligaments/tendons in left knee seem intact- pain with palpation- resembles fibro  Data Reviewed: Basic Metabolic Panel:  Recent Labs Lab 06/28/13 0830 06/29/13 0540  NA 137 137  K 4.4 4.9  CL 106 106  CO2 19 20  GLUCOSE 119* 132*  BUN 48* 44*  CREATININE 2.10* 2.13*  CALCIUM 9.1 9.2   Liver Function Tests: No results found for this basename: AST, ALT, ALKPHOS, BILITOT, PROT, ALBUMIN,  in the last 168 hours No results found  for this basename: LIPASE, AMYLASE,  in the last 168 hours No results found for this basename: AMMONIA,  in the last 168 hours CBC:  Recent Labs Lab 06/28/13 1446 06/29/13 0540  WBC 8.4 9.7  HGB 10.5* 10.9*  HCT 30.1* 33.3*  MCV 89.9 92.0  PLT 348 425*   Cardiac Enzymes: No results found for this basename: CKTOTAL, CKMB, CKMBINDEX, TROPONINI,  in the last 168 hours BNP (last 3 results) No results found for this basename: PROBNP,  in the last 8760 hours CBG:  Recent Labs Lab 06/28/13 0822 06/28/13 1120 06/28/13 1615 06/28/13 2126 06/29/13 0622  GLUCAP 110* 107* 118* 162* 121*    No results found for this or any previous visit (from the past 240 hour(s)).   Studies: Dg Knee 2 Views Left  06/28/2013   CLINICAL DATA:  Unable to bear weight  EXAM: LEFT KNEE - 1-2 VIEW  COMPARISON:  None.  FINDINGS: Frontal and lateral views were obtained. There is no fracture, dislocation, or effusion. There is moderate narrowing medially and in the patellofemoral joint region. There is mild spurring laterally and arising from the posterior patella. No erosive change.  IMPRESSION: Osteoarthritis. No fracture or effusion.   Electronically Signed   By: Lowella Grip   On: 06/28/2013 09:19    Scheduled Meds: . aspirin EC  81 mg Oral Daily  . atorvastatin  40 mg Oral q1800  . docusate sodium  100 mg Oral BID  . lidocaine  1 patch Transdermal Q24H  . metoprolol tartrate  25 mg Oral BID   Continuous Infusions: . dextrose 5 % and 0.9% NaCl 50 mL/hr at 06/29/13 0030    Principal Problem:   Pain in limb Active Problems:   Renal insufficiency   Other and unspecified hyperlipidemia   Obesity, unspecified   Type II or unspecified type diabetes mellitus with unspecified complication, uncontrolled    Time spent: [place], [facility] Pager [phone]. If 7PM-7AM, please contact night-coverage at [email or web address], password [facility] 06/29/2013, 9:07 AM  LOS: 1 day

## 2013-06-29 NOTE — Progress Notes (Signed)
Pt's b/p assessed, B/p 206/77 in right arm with pt laying in bed., B/p 196/77 in left arm with patient laying in bed. Pt's B/P in left arm sitting 183/87 at 2108. MD notified.    Quisha Mabie,MSN,RN

## 2013-06-29 NOTE — Progress Notes (Addendum)
[facility] [place]  Subjective  - "My knee hurts as soon as I stand on it."   Objective 137/50 79 97.5 F (36.4 C) (Oral) 18 99%  Intake/Output Summary (Last 24 hours) at 06/29/13 0749 Last data filed at 06/28/13 1800  Gross per 24 hour  Intake    480 ml  Output      0 ml  Net    480 ml    Distal doppler signals intact and equal bilaterally. Anterior knee pain with resistive straight leg raise and resistive knee extension. Skin is warm and there are no ischemic changes to the left lower extremity including the foot  Left lower extremity duplex completed.  Preliminary report: Left: Monophasic flow from the CFA throughout. 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 Strong collateral at this level. Reconstitution in the distal FA with a mild stenosis. Popliteal artery patent with no aneurysm. Right: CFA monophasic   Assessment/Planning:  ABI still pending  Chronic CFA stenosis with collateral development and patent popliteal artery are evidence of a chronic arterial problem.  This is not likely the cause of her acute knee pain.  Plan would be for a walking program and maximum medical treatment of her underling DM, cholesterol with possible statin if necessary and a balance diet.  F/U with Dr. Bridgett Larsson as an out patient in 3 months.   Left knee lateral joint line narrowing and patella spurring with narrowed patella femoral space leads to moderate arthritis.  This is likely the cause of her acute knee pain with weight bearing activity.  We recommend orthopedic consult for arthritis.    Laurence Slate [facility] 06/29/2013 7:49 AM --  Laboratory Lab Results:  Recent Labs  06/28/13 1446 06/29/13 0540  WBC 8.4 9.7  HGB 10.5* 10.9*  HCT 30.1* 33.3*  PLT 348 425*   BMET  Recent Labs  06/28/13 0830 06/29/13 0540  NA 137 137  K 4.4 4.9  CL 106 106  CO2 19 20  GLUCOSE 119* 132*  BUN 48* 44*  CREATININE  2.10* 2.13*  CALCIUM 9.1 9.2    COAG No results found for this basename: INR, PROTIME   No results found for this basename: PTT   Addendum  I have independently interviewed and examined the patient, and I agree with the physician assistant's findings.  LLE arterial duplex more consistent with CHRONIC disease collateral development already evident.  I doubt her intermittent claudication caused her acute pain in Left leg.  Intermittent claudication is managed by maximal medical management including walk plan initially.  She follow up in the office in 3 months for continued surveillance.  I would obtain the ABI before discharge to set her baseline.  Also consider possible Orthopedic evaluation of the left knee for etiology.  Adele Barthel, MD [facility] [place] Office: [phone] Pager: [phone]  06/29/2013, 8:29 AM

## 2013-06-29 NOTE — Clinical Documentation Improvement (Signed)
THIS DOCUMENT IS NOT A PERMANENT PART OF THE MEDICAL RECORD  Please update your documentation with the medical record to reflect your response to this query. If you need help knowing how to do this please call [phone].  06/29/13   Dear Dr.Vann/Associates,  In a better effort to capture your patient's severity of illness, reflect appropriate length of stay and utilization of resources, a review of the patient medical record has revealed the following indicators.    Based on your clinical judgment, please clarify and document in a progress note and/or discharge summary the clinical condition associated with the following supporting information:  In responding to this query please exercise your independent judgment.  The fact that a query is asked, does not imply that any particular answer is desired or expected.   Possible Clinical Conditions?   "  CKD Stage III - GFR 30-59  "  CKD Stage IV - GFR 15-29  "  CKD Stage V - GFR < 15  "  ESRD (End Stage Renal Disease)  "  Other condition  "  Cannot Clinically determine     Risk Factors:  History of CKD per 10/02 notes.  Hospital problem list of 10/02 notes Renal Insufficiency.    Diagnostics:  Lab:  Bun:  10/02:  48 10/03:  44  Creat:  10/02:  2.10 10/03:  2.13  GFR:  10/02:  25 10/03:  24    You may use possible, probable, or suspect with inpatient documentation. possible, probable, suspected diagnoses MUST be documented at the time of discharge  Reviewed: RI documented per 10/04 discharge summary.  Thank You,  Theron Arista,  Clinical Documentation Specialist: [place]

## 2013-06-29 NOTE — Consult Note (Signed)
Kristen Berry is an 57 y.o. female.    Chief Complaint: left knee pain  HPI: 57 y/o female c/o left knee pain for 3 weeks with inability to bear weight No pain until standing in her kitchen and sudden onset of left knee pain Denies any falls or injuries Denies any other symptoms Pt able to weight bear but c/o pain with weight bearing  Minimal to no pain with rest Denies prior symptoms in the left knee in the past  PCP:  No primary provider on file.  PMH: Past Medical History  Diagnosis Date  . Hypertension   . Chronic kidney disease     PSH: History reviewed. No pertinent past surgical history.  Social History:  reports that she has never smoked. She does not have any smokeless tobacco history on file. She reports that she does not drink alcohol. Her drug history is not on file.  Allergies:  No Known Allergies  Medications: Current Facility-Administered Medications  Medication Dose Route Frequency Provider Last Rate Last Dose  . aspirin EC tablet 81 mg  81 mg Oral Daily Orvan Falconer, MD   81 mg at 06/29/13 1001  . atorvastatin (LIPITOR) tablet 40 mg  40 mg Oral q1800 Orvan Falconer, MD   40 mg at 06/28/13 1745  . docusate sodium (COLACE) capsule 100 mg  100 mg Oral BID Orvan Falconer, MD   100 mg at 06/29/13 1001  . heparin injection 5,000 Units  5,000 Units Subcutaneous Q8H Jessica U Vann, DO   5,000 Units at 06/29/13 1500  . HYDROcodone-acetaminophen (NORCO/VICODIN) 5-325 MG per tablet 1-2 tablet  1-2 tablet Oral Q4H PRN Geradine Girt, DO      . lidocaine (LIDODERM) 5 % 1 patch  1 patch Transdermal Q24H Geradine Girt, DO   1 patch at 06/29/13 0917  . metoprolol tartrate (LOPRESSOR) tablet 25 mg  25 mg Oral BID Orvan Falconer, MD   25 mg at 06/29/13 1001  . ondansetron (ZOFRAN) tablet 4 mg  4 mg Oral Q6H PRN Orvan Falconer, MD       Or  . ondansetron [facility]) injection 4 mg  4 mg Intravenous Q6H PRN Orvan Falconer, MD        Results for orders placed during the hospital encounter of  06/28/13 (from the past 48 hour(s))  GLUCOSE, CAPILLARY     Status: Abnormal   Collection Time    06/28/13  5:27 AM      Result Value Range   Glucose-Capillary 140 (*) 70 - 99 mg/dL  HEMOGLOBIN A1C     Status: Abnormal   Collection Time    06/28/13  5:30 AM      Result Value Range   Hemoglobin A1C 6.6 (*) <5.7 %   Comment: (NOTE)                                                                               According to the ADA Clinical Practice Recommendations for 2011, when     HbA1c is used as a screening test:      >=6.5%   Diagnostic of Diabetes Mellitus               (  if abnormal result is confirmed)     5.7-6.4%   Increased risk of developing Diabetes Mellitus     References:Diagnosis and Classification of Diabetes Mellitus,Diabetes     [id number] 1):S62-S69 and Standards of Medical Care in             Diabetes - 2011,Diabetes [id number] (Suppl 1):S11-S61.   Mean Plasma Glucose 143 (*) <117 mg/dL   Comment: Performed at [place], CAPILLARY     Status: Abnormal   Collection Time    06/28/13  8:22 AM      Result Value Range   Glucose-Capillary 110 (*) 70 - 99 mg/dL  BASIC METABOLIC PANEL     Status: Abnormal   Collection Time    06/28/13  8:30 AM      Result Value Range   Sodium 137  135 - 145 mEq/L   Potassium 4.4  3.5 - 5.1 mEq/L   Chloride 106  96 - 112 mEq/L   CO2 19  19 - 32 mEq/L   Glucose, Bld 119 (*) 70 - 99 mg/dL   BUN 48 (*) 6 - 23 mg/dL   Creatinine, Ser 2.10 (*) 0.50 - 1.10 mg/dL   Calcium 9.1  8.4 - 10.5 mg/dL   GFR calc non Af Amer 25 (*) >90 mL/min   GFR calc Af Amer 29 (*) >90 mL/min   Comment: (NOTE)     The eGFR has been calculated using the CKD EPI equation.     This calculation has not been validated in all clinical situations.     eGFR's persistently <90 mL/min signify possible Chronic Kidney     Disease.  GLUCOSE, CAPILLARY     Status: Abnormal   Collection Time    06/28/13 11:20 AM      Result Value Range    Glucose-Capillary 107 (*) 70 - 99 mg/dL  HEPARIN LEVEL (UNFRACTIONATED)     Status: None   Collection Time    06/28/13  2:46 PM      Result Value Range   Heparin Unfractionated 0.40  0.30 - 0.70 IU/mL   Comment:            IF HEPARIN RESULTS ARE BELOW     EXPECTED VALUES, AND PATIENT     DOSAGE HAS BEEN CONFIRMED,     SUGGEST FOLLOW UP TESTING     OF ANTITHROMBIN III LEVELS.  CBC     Status: Abnormal   Collection Time    06/28/13  2:46 PM      Result Value Range   WBC 8.4  4.0 - 10.5 K/uL   RBC 3.35 (*) 3.87 - 5.11 MIL/uL   Hemoglobin 10.5 (*) 12.0 - 15.0 g/dL   HCT 30.1 (*) 36.0 - 46.0 %   MCV 89.9  78.0 - 100.0 fL   MCH 31.3  26.0 - 34.0 pg   MCHC 34.9  30.0 - 36.0 g/dL   RDW 14.9  11.5 - 15.5 %   Platelets 348  150 - 400 K/uL  GLUCOSE, CAPILLARY     Status: Abnormal   Collection Time    06/28/13  4:15 PM      Result Value Range   Glucose-Capillary 118 (*) 70 - 99 mg/dL  GLUCOSE, CAPILLARY     Status: Abnormal   Collection Time    06/28/13  9:26 PM      Result Value Range   Glucose-Capillary 162 (*) 70 - 99 mg/dL   Comment 1 Documented in  Chart     Comment 2 Notify RN    HEPARIN LEVEL (UNFRACTIONATED)     Status: None   Collection Time    06/28/13 11:30 PM      Result Value Range   Heparin Unfractionated 0.47  0.30 - 0.70 IU/mL   Comment:            IF HEPARIN RESULTS ARE BELOW     EXPECTED VALUES, AND PATIENT     DOSAGE HAS BEEN CONFIRMED,     SUGGEST FOLLOW UP TESTING     OF ANTITHROMBIN III LEVELS.  HEPARIN LEVEL (UNFRACTIONATED)     Status: None   Collection Time    06/29/13  5:40 AM      Result Value Range   Heparin Unfractionated 0.51  0.30 - 0.70 IU/mL   Comment:            IF HEPARIN RESULTS ARE BELOW     EXPECTED VALUES, AND PATIENT     DOSAGE HAS BEEN CONFIRMED,     SUGGEST FOLLOW UP TESTING     OF ANTITHROMBIN III LEVELS.  CBC     Status: Abnormal   Collection Time    06/29/13  5:40 AM      Result Value Range   WBC 9.7  4.0 - 10.5 K/uL    RBC 3.62 (*) 3.87 - 5.11 MIL/uL   Hemoglobin 10.9 (*) 12.0 - 15.0 g/dL   HCT 33.3 (*) 36.0 - 46.0 %   MCV 92.0  78.0 - 100.0 fL   MCH 30.1  26.0 - 34.0 pg   MCHC 32.7  30.0 - 36.0 g/dL   RDW 15.0  11.5 - 15.5 %   Platelets 425 (*) 150 - 400 K/uL  BASIC METABOLIC PANEL     Status: Abnormal   Collection Time    06/29/13  5:40 AM      Result Value Range   Sodium 137  135 - 145 mEq/L   Potassium 4.9  3.5 - 5.1 mEq/L   Chloride 106  96 - 112 mEq/L   CO2 20  19 - 32 mEq/L   Glucose, Bld 132 (*) 70 - 99 mg/dL   BUN 44 (*) 6 - 23 mg/dL   Creatinine, Ser 2.13 (*) 0.50 - 1.10 mg/dL   Calcium 9.2  8.4 - 10.5 mg/dL   GFR calc non Af Amer 24 (*) >90 mL/min   GFR calc Af Amer 28 (*) >90 mL/min   Comment: (NOTE)     The eGFR has been calculated using the CKD EPI equation.     This calculation has not been validated in all clinical situations.     eGFR's persistently <90 mL/min signify possible Chronic Kidney     Disease.  GLUCOSE, CAPILLARY     Status: Abnormal   Collection Time    06/29/13  6:22 AM      Result Value Range   Glucose-Capillary 121 (*) 70 - 99 mg/dL   Dg Knee 2 Views Left  06/28/2013   CLINICAL DATA:  Unable to bear weight  EXAM: LEFT KNEE - 1-2 VIEW  COMPARISON:  None.  FINDINGS: Frontal and lateral views were obtained. There is no fracture, dislocation, or effusion. There is moderate narrowing medially and in the patellofemoral joint region. There is mild spurring laterally and arising from the posterior patella. No erosive change.  IMPRESSION: Osteoarthritis. No fracture or effusion.   Electronically Signed   By: Lowella Grip   On: 06/28/2013 09:19  ROS: Pain with ambulation  Physical Exam: Alert and oriented 57 y/o female in no acute distress Pelvis stable with no pain with ER and IR of bilateral legs Left knee: mild to moderate joint line tenderness to medial and lateral joint lines No effusion Moderate crepitus with rom Mildly antalgic gait nv intact  distally No rashes or edema Calf non tender and no signs of dvt   X-rays: moderate osteoarthritis left knee No fracture  Assessment/Plan Assessment: Left knee pain secondary to osteoarthritic flare vs degenerative meniscal tear  Plan: Recommend cortisone injection for pain relief today Weight bear and activity as tolerated Follow up in our office for continued or worsening symptoms Will continue to monitor her progress

## 2013-06-30 LAB — BASIC METABOLIC PANEL
BUN: 48 mg/dL — ABNORMAL HIGH (ref 6–23)
BUN: 55 mg/dL — ABNORMAL HIGH (ref 6–23)
CO2: 16 mEq/L — ABNORMAL LOW (ref 19–32)
CO2: 17 mEq/L — ABNORMAL LOW (ref 19–32)
Calcium: 9.2 mg/dL (ref 8.4–10.5)
Calcium: 9 mg/dL (ref 8.4–10.5)
Chloride: 106 mEq/L (ref 96–112)
Chloride: 103 mEq/L (ref 96–112)
Creatinine, Ser: 2.04 mg/dL — ABNORMAL HIGH (ref 0.50–1.10)
Creatinine, Ser: 2.05 mg/dL — ABNORMAL HIGH (ref 0.50–1.10)
GFR calc Af Amer: 30 mL/min — ABNORMAL LOW (ref >90)
GFR calc Af Amer: 30 mL/min — ABNORMAL LOW (ref >90)
GFR calc non Af Amer: 26 mL/min — ABNORMAL LOW (ref >90)
GFR calc non Af Amer: 26 mL/min — ABNORMAL LOW (ref >90)
Glucose, Bld: 219 mg/dL — ABNORMAL HIGH (ref 70–99)
Glucose, Bld: 248 mg/dL — ABNORMAL HIGH (ref 70–99)
Potassium: 5.4 mEq/L — ABNORMAL HIGH (ref 3.5–5.1)
Potassium: 5.4 mEq/L — ABNORMAL HIGH (ref 3.5–5.1)
Sodium: 137 mEq/L (ref 135–145)
Sodium: 134 mEq/L — ABNORMAL LOW (ref 135–145)

## 2013-06-30 MED ORDER — SODIUM POLYSTYRENE SULFONATE 15 GM/60ML PO SUSP
15.0000 g | Freq: Once | ORAL | Status: AC
  Administered 2013-06-30: 15 g via ORAL
  Filled 2013-06-30: qty 60

## 2013-06-30 MED ORDER — HYDROCODONE-ACETAMINOPHEN 5-325 MG PO TABS: 1.0000 | ORAL_TABLET | ORAL | Status: DC | PRN

## 2013-06-30 NOTE — Discharge Summary (Signed)
Physician Discharge Summary  Kristen Berry [id number] DOB: Jun 13, 1956 DOA: 06/28/2013  PCP: No primary provider on file.  Admit date: 06/28/2013 Discharge date: 06/30/2013  Time spent: 35 minutes  Recommendations for Outpatient Follow-up:  1. BMP early next week re K 2. Vascular in 3 months Bridgett Larsson) 3. Ortho PRN for steroid injections [facility])  Discharge Diagnoses:  Principal Problem:   Pain in limb Active Problems:   Renal insufficiency   Other and unspecified hyperlipidemia   Obesity, unspecified   Type II or unspecified type diabetes mellitus with unspecified complication, uncontrolled   Discharge Condition: improved  Diet recommendation: cardiac/diabetic  Filed Weights   06/28/13 0410 06/30/13 0453  Weight: 108.41 kg (239 lb) 107.1 kg (236 lb 1.8 oz)    History of present illness:  Kristen Berry is an 57 y.o. female with hx of DM2, previously on Metformin but stopped due to "kidney problem", HTN, obesity, presents to [facility] ER complaining of sudden onset of left lower leg pain acutely 3 weeks ago while walking in a grocery store. Since then, pain has been persisting, worse on ambulation, but also having rest pain. She didn't have any lower back or buttock pain. There has been no fever or chills. She said her pain was mild to moderate, but tonight it was more severe. There has been no chest pain. She went to [facility] ER, where a venous doppler was done showing no DVT, but querry arterial occlusion. Other study there included an EKG showing NSR and unremarkable. Her Cr was elevated to 2.0 with BUN 47. Her WBC was 11K, Hb 11.5 grams per DL. Her BS was 231, K 5.3 and Bicarb of 16. Dr Bridgett Larsson of vascular surgery was consulted, recommended out patient follow up if able to control pain. Hospitalist was requested to accept her in transfers for inpatient treatment. Doppler of her leg showed the presence of both DP and PT pulses   Hospital Course:   Assessment: Left knee pain secondary to osteoarthritic flare vs degenerative meniscal tear - found not to be vascular related: LLE arterial duplex more consistent with CHRONIC disease collateral development already evident. I doubt her intermittent claudication caused her acute pain in Left leg. Intermittent claudication is managed by maximal medical management including walk plan initially. She follow up in the office in 3 months for continued surveillance. I would obtain the ABI   cortisone injection for pain relief  Weight bear and activity as tolerated  Follow up with ortho for continued or worsening symptoms  Will continue to monitor her progress    Procedures:  Steroid injection  Consultations:  Ortho  vascular  Discharge Exam: Filed Vitals:   06/30/13 0453  BP: 142/57  Pulse: 94  Temp: 98.1 F (36.7 C)  Resp: 18    General: A+Ox3, NAd Cardiovascular:rrr Respiratory: clear anterior  Discharge Instructions  Discharge Orders   Future Appointments Provider Department Dept Phone   10/05/2013 1:00 PM [facility] [place] [phone]   10/05/2013 1:30 PM Conrad Gladstone, MD [facility] [phone]   Future Orders Complete By Expires   Diet - low sodium heart healthy  As directed    Diet Carb Modified  As directed    Discharge instructions  As directed    Comments:     BMp 1 week re K   Increase activity slowly  As directed        Medication List  amLODipine-valsartan 10-160 MG per tablet  Commonly known as:  EXFORGE  Take 1 tablet by mouth daily.     aspirin EC 81 MG tablet  Take 81 mg by mouth daily.     hydrochlorothiazide 25 MG tablet  Commonly known as:  HYDRODIURIL  Take 25 mg by mouth daily.     HYDROcodone-acetaminophen 5-325 MG per tablet  Commonly known as:  NORCO/VICODIN  Take 1-2 tablets by mouth every 4 (four) hours as needed.     rosuvastatin 10 MG tablet  Commonly known  as:  CRESTOR  Take 10 mg by mouth daily.       No Known Allergies     Follow-up Information   Follow up with Hinda Lenis, MD In 3 months. (sent message to office for appt.)    Specialty:  Vascular Surgery   Contact information:   [address] [place] [address] [phone]        The results of significant diagnostics from this hospitalization (including imaging, microbiology, ancillary and laboratory) are listed below for reference.    Significant Diagnostic Studies: Dg Knee 2 Views Left  06/28/2013   CLINICAL DATA:  Unable to bear weight  EXAM: LEFT KNEE - 1-2 VIEW  COMPARISON:  None.  FINDINGS: Frontal and lateral views were obtained. There is no fracture, dislocation, or effusion. There is moderate narrowing medially and in the patellofemoral joint region. There is mild spurring laterally and arising from the posterior patella. No erosive change.  IMPRESSION: Osteoarthritis. No fracture or effusion.   Electronically Signed   By: Lowella Grip   On: 06/28/2013 09:19    Microbiology: No results found for this or any previous visit (from the past 240 hour(s)).   Labs: Basic Metabolic Panel:  Recent Labs Lab 06/28/13 0830 06/29/13 0540 06/30/13 0600  NA 137 137 137  K 4.4 4.9 5.4*  CL 106 106 106  CO2 19 20 16*  GLUCOSE 119* 132* 219*  BUN 48* 44* 48*  CREATININE 2.10* 2.13* 2.04*  CALCIUM 9.1 9.2 9.2   Liver Function Tests: No results found for this basename: AST, ALT, ALKPHOS, BILITOT, PROT, ALBUMIN,  in the last 168 hours No results found for this basename: LIPASE, AMYLASE,  in the last 168 hours No results found for this basename: AMMONIA,  in the last 168 hours CBC:  Recent Labs Lab 06/28/13 1446 06/29/13 0540  WBC 8.4 9.7  HGB 10.5* 10.9*  HCT 30.1* 33.3*  MCV 89.9 92.0  PLT 348 425*   Cardiac Enzymes: No results found for this basename: CKTOTAL, CKMB, CKMBINDEX, TROPONINI,  in the last 168 hours BNP: BNP (last 3 results) No  results found for this basename: PROBNP,  in the last 8760 hours CBG:  Recent Labs Lab 06/28/13 0822 06/28/13 1120 06/28/13 1615 06/28/13 2126 06/29/13 0622  GLUCAP 110* 107* 118* 162* 121*       Signed:  VANN, JESSICA  [facility] 06/30/2013, 9:35 AM

## 2013-06-30 NOTE — Progress Notes (Signed)
Patient received discharge instructions, follow-up appts, medication lists, and when to call the doctor. All questions answered and pt verbalizes understanding. Blanchard Kelch, RN

## 2013-07-03 ENCOUNTER — Other Ambulatory Visit: Payer: Self-pay | Admitting: Physician Assistant

## 2013-07-03 NOTE — Progress Notes (Addendum)
Procedure note: after sterile prep and consent 4/4/1 of 0.5% marcaine, 1% lidocaine and 40 mg/1cc of kenalog injected into left knee for pain relief into the left knee Pt tolerated the procedure well and was discharged home the next day Instructions to follow up in our office given This procedure and consult was performed on 06/29/13 while she was an inpatient

## 2013-08-15 ENCOUNTER — Other Ambulatory Visit: Payer: Self-pay | Admitting: Vascular Surgery

## 2013-08-15 DIAGNOSIS — I739 Peripheral vascular disease, unspecified: Secondary | ICD-10-CM

## 2013-10-04 ENCOUNTER — Encounter: Payer: Self-pay | Admitting: Vascular Surgery

## 2013-10-05 ENCOUNTER — Encounter (HOSPITAL_COMMUNITY): Payer: Self-pay

## 2013-10-05 ENCOUNTER — Ambulatory Visit: Payer: Self-pay | Admitting: Family

## 2015-08-18 ENCOUNTER — Telehealth: Payer: Self-pay | Admitting: Hematology

## 2015-08-18 NOTE — Telephone Encounter (Signed)
Pt aware of appt 10/06/14@10 :30

## 2015-08-28 ENCOUNTER — Ambulatory Visit: Payer: Self-pay | Admitting: Hematology

## 2015-10-07 ENCOUNTER — Ambulatory Visit: Payer: Self-pay | Admitting: Hematology

## 2016-04-26 ENCOUNTER — Telehealth: Payer: Self-pay | Admitting: Hematology and Oncology

## 2016-04-26 ENCOUNTER — Encounter: Payer: Self-pay | Admitting: Hematology and Oncology

## 2016-04-26 NOTE — Telephone Encounter (Signed)
Patient returned my call to schedule an appointment. She can only come in on Tuesday mornings. Appointment with Alvy Bimler on 8/22 at 1015am. Patient voiced understanding. Letter mailed to the patient.

## 2016-05-17 NOTE — Progress Notes (Signed)
[facility] NOTE  Patient Care Team: Sarah T Martinique, MD as PCP - General (Family Medicine) Edrick Oh, MD as Consulting Physician (Nephrology)  CHIEF COMPLAINTS/PURPOSE OF CONSULTATION:  Abnormal M SPike  HISTORY OF PRESENTING ILLNESS:  Kristen Berry 60 y.o. female is here because of anemia and abnormal M spike. I review her outside records faxed to [place] from her nephrologist clinic  She was found to have abnormal CBC from blood work dated 07/08/2015. White blood cell count 9.4, hemoglobin 10.3 and platelet count of 401,000. Serum protein electrophoresis detected 0.3 g of abnormal M spike She was initially referred her last year but the patient subsequently did not make it to the appointment. On 03/23/2016, repeat blood work showed white blood cell count of 8.4, hemoglobin of 11.5, platelet count of 402,000. Serum creatinine at 2.54 with normal calcium of 9.0  She denies history of abnormal bone pain or bone fracture. She have chronic bilateral knee pain. The patient have peripheral neuropathy secondary to diabetes. Patient denies history of recurrent infection or atypical infections such as shingles of meningitis. Denies chills, night sweats, anorexia or abnormal weight loss.  MEDICAL HISTORY:  Past Medical History:  Diagnosis Date  . CHF (congestive heart failure) ([place])   . Chronic kidney disease   . Diabetes mellitus without complication ([place])   . Hypertension     SURGICAL HISTORY: Past Surgical History:  Procedure Laterality Date  . ROTATOR CUFF REPAIR    . TUBAL LIGATION      SOCIAL HISTORY: Social History   Social History  . Marital status: Divorced    Spouse name: N/A  . Number of children: 3  . Years of education: N/A   Occupational History  . disabled    Social History Main Topics  . Smoking status: Never Smoker  . Smokeless tobacco: Never Used  . Alcohol use No  . Drug use: Unknown  . Sexual activity: Not on file    Other Topics Concern  . Not on file   Social History Narrative  . No narrative on file    FAMILY HISTORY: No family history on file.  ALLERGIES:  has No Known Allergies.  MEDICATIONS:  Current Outpatient Prescriptions  Medication Sig Dispense Refill  . amLODipine-valsartan (EXFORGE) 10-160 MG per tablet Take 1 tablet by mouth daily.    [name] aspirin EC 81 MG tablet Take 81 mg by mouth daily.    . hydrochlorothiazide (HYDRODIURIL) 25 MG tablet Take 25 mg by mouth daily.    [name] HYDROcodone-acetaminophen (NORCO/VICODIN) 5-325 MG per tablet Take 1-2 tablets by mouth every 4 (four) hours as needed. 30 tablet 0  . rosuvastatin (CRESTOR) 10 MG tablet Take 10 mg by mouth daily.     No current facility-administered medications for this visit.     REVIEW OF SYSTEMS:   Eyes: Denies blurriness of vision, double vision or watery eyes Ears, nose, mouth, throat, and face: Denies mucositis or sore throat Respiratory: Denies cough, dyspnea or wheezes Cardiovascular: Denies palpitation, chest discomfort  Gastrointestinal:  Denies nausea, heartburn or change in bowel habits Skin: Denies abnormal skin rashes Lymphatics: Denies new lymphadenopathy or easy bruising Neurological:Denies numbness, tingling or new weaknesses Behavioral/Psych: Mood is stable, no new changes  All other systems were reviewed with the patient and are negative.  PHYSICAL EXAMINATION: ECOG PERFORMANCE STATUS: 1 - Symptomatic but completely ambulatory  Vitals:   05/18/16 0948  BP: 104/69  Pulse: 100  Resp: 18  Temp: 97.5 F (36.4  C)   Filed Weights   05/18/16 0948  Weight: 238 lb 8 oz (108.2 kg)    GENERAL:alert, no distress and comfortable. She is obese SKIN: skin color, texture, turgor are normal, no rashes or significant lesions. Noted chronic venous stasis changes EYES: normal, conjunctiva are pink and non-injected, sclera clear OROPHARYNX:no exudate, no erythema and lips, buccal mucosa, and tongue normal   NECK: supple, thyroid normal size, non-tender, without nodularity LYMPH:  no palpable lymphadenopathy in the cervical, axillary or inguinal LUNGS: clear to auscultation and percussion with normal breathing effort HEART: regular rate & rhythm and no murmurs with moderate bilateral lower extremity edema ABDOMEN:abdomen soft, non-tender and normal bowel sounds Musculoskeletal:no cyanosis of digits and no clubbing  PSYCH: alert & oriented x 3 with fluent speech NEURO: no focal motor/sensory deficits  LABORATORY DATA:  I have reviewed the data as listed Lab Results  Component Value Date   WBC 9.7 06/29/2013   HGB 10.9 (L) 06/29/2013   HCT 33.3 (L) 06/29/2013   MCV 92.0 06/29/2013   PLT 425 (H) 06/29/2013    ASSESSMENT & PLAN:  MGUS (monoclonal gammopathy of unknown significance) To rule out multiple myeloma, I recommend complete blood work, 24 hour urine collection for UPEP and skeletal survey to rule out multiple myeloma I suspect her chronic renal failure is not related to MGUS    Chronic kidney disease, stage IV (severe) ([place]) This is likely due to long-standing history of diabetes and hypertension. I would defer to her nephrologist for further management  CHF (congestive heart failure) [facility]) She will continue medical management. She does not follow with cardiologist Clinically, she appears slightly volume overloaded but she is already on diuretic therapy  Type 2 diabetes mellitus with neurologic complication, without long-term current use of insulin ([place]) Surprisingly, she is not on medications for diabetes. She complained of neuropathy which I think is related to diabetes and not from MGUS  Orders Placed This Encounter  Procedures  . DG Bone Survey Met    Standing Status:   Future    Standing Expiration Date:   07/18/2017    Order Specific Question:   Reason for Exam (SYMPTOM  OR DIAGNOSIS REQUIRED)    Answer:   staging myeloma    Order Specific Question:   Preferred  imaging location?    Answer:   [facility]  . CBC with Differential/Platelet    Standing Status:   Future    Standing Expiration Date:   07/18/2017  . Comprehensive metabolic panel    Standing Status:   Future    Standing Expiration Date:   07/18/2017  . Kappa/lambda light chains    Standing Status:   Future    Standing Expiration Date:   07/18/2017  . IFE/Light Chains/TP Qn, 24-Hr Ur    Standing Status:   Future    Standing Expiration Date:   07/18/2017  . Beta 2 microglobulin, serum    Standing Status:   Future    Standing Expiration Date:   07/18/2017  . Multiple Myeloma Panel (SPEP&IFE w/QIG)    Standing Status:   Future    Standing Expiration Date:   07/18/2017     All questions were answered. The patient knows to call the clinic with any problems, questions or concerns. I spent 40 minutes counseling the patient face to face. The total time spent in the appointment was 55 minutes and more than 50% was on counseling.     [place], [place], MD 05/18/16 9:49 AM

## 2016-05-18 ENCOUNTER — Encounter: Payer: Self-pay | Admitting: Hematology and Oncology

## 2016-05-18 ENCOUNTER — Ambulatory Visit (HOSPITAL_BASED_OUTPATIENT_CLINIC_OR_DEPARTMENT_OTHER): Payer: Medicare Other | Admitting: Hematology and Oncology

## 2016-05-18 ENCOUNTER — Telehealth: Payer: Self-pay | Admitting: Hematology and Oncology

## 2016-05-18 DIAGNOSIS — E1140 Type 2 diabetes mellitus with diabetic neuropathy, unspecified: Secondary | ICD-10-CM

## 2016-05-18 DIAGNOSIS — I509 Heart failure, unspecified: Secondary | ICD-10-CM | POA: Diagnosis not present

## 2016-05-18 DIAGNOSIS — N184 Chronic kidney disease, stage 4 (severe): Secondary | ICD-10-CM

## 2016-05-18 DIAGNOSIS — D472 Monoclonal gammopathy: Secondary | ICD-10-CM | POA: Diagnosis present

## 2016-05-18 NOTE — Assessment & Plan Note (Signed)
Surprisingly, she is not on medications for diabetes. She complained of neuropathy which I think is related to diabetes and not from MGUS

## 2016-05-18 NOTE — Assessment & Plan Note (Signed)
She will continue medical management. She does not follow with cardiologist Clinically, she appears slightly volume overloaded but she is already on diuretic therapy

## 2016-05-18 NOTE — Telephone Encounter (Signed)
Gave patient avs report and appointments for August and September. Appointments dates/times adjusted to 8/29 and 9/19 due to patient's transportation needs - cleared by NG. Central radiology will call re xray - patient aware.

## 2016-05-18 NOTE — Assessment & Plan Note (Signed)
To rule out multiple myeloma, I recommend complete blood work, 24 hour urine collection for UPEP and skeletal survey to rule out multiple myeloma I suspect her chronic renal failure is not related to MGUS

## 2016-05-18 NOTE — Assessment & Plan Note (Signed)
This is likely due to long-standing history of diabetes and hypertension. I would defer to her nephrologist for further management 

## 2016-05-25 ENCOUNTER — Other Ambulatory Visit (HOSPITAL_BASED_OUTPATIENT_CLINIC_OR_DEPARTMENT_OTHER): Payer: Medicare Other

## 2016-05-25 ENCOUNTER — Ambulatory Visit (HOSPITAL_COMMUNITY)
Admission: RE | Admit: 2016-05-25 | Discharge: 2016-05-25 | Disposition: A | Discharge status: Home / Self Care | Payer: Medicare Other | Source: Ambulatory Visit | Attending: Hematology and Oncology | Admitting: Hematology and Oncology

## 2016-05-25 DIAGNOSIS — K8020 Calculus of gallbladder without cholecystitis without obstruction: Secondary | ICD-10-CM | POA: Insufficient documentation

## 2016-05-25 DIAGNOSIS — R937 Abnormal findings on diagnostic imaging of other parts of musculoskeletal system: Secondary | ICD-10-CM | POA: Insufficient documentation

## 2016-05-25 DIAGNOSIS — I700 Atherosclerosis of aorta: Secondary | ICD-10-CM | POA: Insufficient documentation

## 2016-05-25 DIAGNOSIS — D472 Monoclonal gammopathy: Secondary | ICD-10-CM

## 2016-05-25 DIAGNOSIS — N184 Chronic kidney disease, stage 4 (severe): Secondary | ICD-10-CM

## 2016-05-25 LAB — CBC WITH DIFFERENTIAL/PLATELET
BASO%: 0.9 % (ref 0.0–2.0)
Basophils Absolute: 0.1 10*3/uL (ref 0.0–0.1)
EOS%: 2.5 % (ref 0.0–7.0)
Eosinophils Absolute: 0.2 10*3/uL (ref 0.0–0.5)
HCT: 34.6 % — ABNORMAL LOW (ref 34.8–46.6)
HGB: 11.2 g/dL — ABNORMAL LOW (ref 11.6–15.9)
LYMPH%: 13.7 % — ABNORMAL LOW (ref 14.0–49.7)
MCH: 29.3 pg (ref 25.1–34.0)
MCHC: 32.5 g/dL (ref 31.5–36.0)
MCV: 90 fL (ref 79.5–101.0)
MONO#: 0.6 10*3/uL (ref 0.1–0.9)
MONO%: 7.6 % (ref 0.0–14.0)
NEUT#: 6.3 10*3/uL (ref 1.5–6.5)
NEUT%: 75.3 % (ref 38.4–76.8)
Platelets: 394 10*3/uL (ref 145–400)
RBC: 3.84 10*6/uL (ref 3.70–5.45)
RDW: 17.5 % — ABNORMAL HIGH (ref 11.2–14.5)
WBC: 8.4 10*3/uL (ref 3.9–10.3)
lymph#: 1.1 10*3/uL (ref 0.9–3.3)

## 2016-05-25 LAB — COMPREHENSIVE METABOLIC PANEL
ALT: <9 U/L (ref 0–55)
AST: 9 U/L (ref 5–34)
Albumin: 3.7 g/dL (ref 3.5–5.0)
Alkaline Phosphatase: 129 U/L (ref 40–150)
Anion Gap: 11 mEq/L (ref 3–11)
BUN: 35.3 mg/dL — ABNORMAL HIGH (ref 7.0–26.0)
CO2: 18 mEq/L — ABNORMAL LOW (ref 22–29)
Calcium: 9.4 mg/dL (ref 8.4–10.4)
Chloride: 111 mEq/L — ABNORMAL HIGH (ref 98–109)
Creatinine: 2.5 mg/dL — ABNORMAL HIGH (ref 0.6–1.1)
EGFR: 20 mL/min/{1.73_m2} — ABNORMAL LOW (ref >90)
Glucose: 124 mg/dl (ref 70–140)
Potassium: 4.2 mEq/L (ref 3.5–5.1)
Sodium: 140 mEq/L (ref 136–145)
Total Bilirubin: 0.48 mg/dL (ref 0.20–1.20)
Total Protein: 7.9 g/dL (ref 6.4–8.3)

## 2016-05-26 LAB — KAPPA/LAMBDA LIGHT CHAINS
Ig Kappa Free Light Chain: 74.7 mg/L — ABNORMAL HIGH (ref 3.3–19.4)
Ig Lambda Free Light Chain: 45.3 mg/L — ABNORMAL HIGH (ref 5.7–26.3)
Kappa/Lambda FluidC Ratio: 1.65 (ref 0.26–1.65)

## 2016-05-26 LAB — BETA 2 MICROGLOBULIN, SERUM: Beta-2: 7.8 mg/L — ABNORMAL HIGH (ref 0.6–2.4)

## 2016-05-27 LAB — MULTIPLE MYELOMA PANEL, SERUM
Albumin SerPl Elph-Mcnc: 3.5 g/dL (ref 2.9–4.4)
Albumin/Glob SerPl: 1 (ref 0.7–1.7)
Alpha 1: 0.3 g/dL (ref 0.0–0.4)
Alpha2 Glob SerPl Elph-Mcnc: 0.8 g/dL (ref 0.4–1.0)
B-Globulin SerPl Elph-Mcnc: 1.1 g/dL (ref 0.7–1.3)
Gamma Glob SerPl Elph-Mcnc: 1.3 g/dL (ref 0.4–1.8)
Globulin, Total: 3.6 g/dL (ref 2.2–3.9)
IgA, Qn, Serum: 316 mg/dL (ref 87–352)
IgG, Qn, Serum: 1245 mg/dL (ref 700–1600)
IgM, Qn, Serum: 91 mg/dL (ref 26–217)
Total Protein: 7.1 g/dL (ref 6.0–8.5)

## 2016-05-27 LAB — UIFE/LIGHT CHAINS/TP QN, 24-HR UR
FR KAPPA LT CH,24HR: 162 mg/24 hr
FR LAMBDA LT CH,24HR: 7 mg/24 hr
Free Kappa Lt Chains,Ur: 162 mg/L — ABNORMAL HIGH (ref 1.35–24.19)
Free Lambda Lt Chains,Ur: 6.51 mg/L (ref 0.24–6.66)
Kappa/Lambda Ratio,U: 24.88 — ABNORMAL HIGH (ref 2.04–10.37)
PROTEIN,TOTAL,URINE: 14.2 mg/dL
Prot,24hr calculated: 142 mg/24 hr (ref 30–150)

## 2016-06-15 ENCOUNTER — Telehealth: Payer: Self-pay | Admitting: Hematology and Oncology

## 2016-06-15 ENCOUNTER — Ambulatory Visit (HOSPITAL_BASED_OUTPATIENT_CLINIC_OR_DEPARTMENT_OTHER): Payer: Medicare Other | Admitting: Hematology and Oncology

## 2016-06-15 ENCOUNTER — Encounter: Payer: Self-pay | Admitting: Hematology and Oncology

## 2016-06-15 VITALS — BP 134/91 | HR 97 | Temp 97.5°F | Resp 18 | Ht 67.0 in | Wt 235.4 lb

## 2016-06-15 DIAGNOSIS — D472 Monoclonal gammopathy: Secondary | ICD-10-CM | POA: Diagnosis present

## 2016-06-15 DIAGNOSIS — N184 Chronic kidney disease, stage 4 (severe): Secondary | ICD-10-CM | POA: Diagnosis not present

## 2016-06-15 DIAGNOSIS — Z23 Encounter for immunization: Secondary | ICD-10-CM | POA: Diagnosis not present

## 2016-06-15 DIAGNOSIS — E1142 Type 2 diabetes mellitus with diabetic polyneuropathy: Secondary | ICD-10-CM | POA: Diagnosis not present

## 2016-06-15 DIAGNOSIS — R937 Abnormal findings on diagnostic imaging of other parts of musculoskeletal system: Secondary | ICD-10-CM | POA: Diagnosis not present

## 2016-06-15 MED ORDER — INFLUENZA VAC SPLIT QUAD 0.5 ML IM SUSY
0.5000 mL | PREFILLED_SYRINGE | Freq: Once | INTRAMUSCULAR | Status: AC
  Administered 2016-06-15: 0.5 mL via INTRAMUSCULAR
  Filled 2016-06-15: qty 0.5

## 2016-06-15 NOTE — Progress Notes (Signed)
[facility] OFFICE PROGRESS NOTE  Patient Care Team: Sarah T Martinique, MD as PCP - General (Family Medicine) Edrick Oh, MD as Consulting Physician (Nephrology)  SUMMARY OF ONCOLOGIC HISTORY:  Kristen Berry [place] 60 y.o. female is here because of anemia and abnormal M spike. I review her outside records faxed to [place] from her nephrologist clinic  She was found to have abnormal CBC from blood work dated 07/08/2015. White blood cell count 9.4, hemoglobin 10.3 and platelet count of 401,000. Serum protein electrophoresis detected 0.3 g of abnormal M spike She was initially referred her last year but the patient subsequently did not make it to the appointment. On 03/23/2016, repeat blood work showed white blood cell count of 8.4, hemoglobin of 11.5, platelet count of 402,000. Serum creatinine at 2.54 with normal calcium of 9.0  She denies history of abnormal bone pain or bone fracture. She have chronic bilateral knee pain. The patient have peripheral neuropathy secondary to diabetes. Patient denies history of recurrent infection or atypical infections such as shingles of meningitis. Denies chills, night sweats, anorexia or abnormal weight loss  INTERVAL HISTORY: Please see below for problem oriented charting. She returns today to review test results. She has no new complaints of bone pain.  REVIEW OF SYSTEMS:   Constitutional: Denies fevers, chills or abnormal weight loss Eyes: Denies blurriness of vision Ears, nose, mouth, throat, and face: Denies mucositis or sore throat Respiratory: Denies cough, dyspnea or wheezes Cardiovascular: Denies palpitation, chest discomfort or lower extremity swelling Gastrointestinal:  Denies nausea, heartburn or change in bowel habits Skin: Denies abnormal skin rashes Lymphatics: Denies new lymphadenopathy or easy bruising Neurological:Denies numbness, tingling or new weaknesses Behavioral/Psych: Mood is stable, no new changes  All other  systems were reviewed with the patient and are negative.  I have reviewed the past medical history, past surgical history, social history and family history with the patient and they are unchanged from previous note.  ALLERGIES:  has No Known Allergies.  MEDICATIONS:  Current Outpatient Prescriptions  Medication Sig Dispense Refill  . amLODipine-valsartan (EXFORGE) 10-160 MG per tablet Take 1 tablet by mouth daily.    [name] aspirin EC 81 MG tablet Take 81 mg by mouth daily.    . furosemide (LASIX) 40 MG tablet Take 40 mg by mouth 2 (two) times daily.    [name] gabapentin (NEURONTIN) 100 MG capsule Take 100 mg by mouth daily.    . hydrALAZINE (APRESOLINE) 50 MG tablet Take 50 mg by mouth 2 (two) times daily.    . isosorbide mononitrate (IMDUR) 60 MG 24 hr tablet Take 60 mg by mouth daily.    [name] labetalol (NORMODYNE) 200 MG tablet Take 200 mg by mouth 2 (two) times daily. 2 tablets BID    . rosuvastatin (CRESTOR) 10 MG tablet Take 10 mg by mouth daily.     No current facility-administered medications for this visit.     PHYSICAL EXAMINATION: ECOG PERFORMANCE STATUS: 0 - Asymptomatic  Vitals:   06/15/16 0927  BP: (!) 134/91  Pulse: 97  Resp: 18  Temp: 97.5 F (36.4 C)   Filed Weights   06/15/16 0927  Weight: 235 lb 6.4 oz (106.8 kg)    GENERAL:alert, no distress and comfortable SKIN: skin color, texture, turgor are normal, no rashes or significant lesions EYES: normal, Conjunctiva are pink and non-injected, sclera clear Musculoskeletal:no cyanosis of digits and no clubbing  NEURO: alert & oriented x 3 with fluent speech, no focal motor/sensory deficits  LABORATORY  DATA:  I have reviewed the data as listed    Component Value Date/Time   NA 140 05/25/2016 1127   K 4.2 05/25/2016 1127   CL 103 06/30/2013 1350   CO2 18 (L) 05/25/2016 1127   GLUCOSE 124 05/25/2016 1127   BUN 35.3 (H) 05/25/2016 1127   CREATININE 2.5 (H) 05/25/2016 1127   CALCIUM 9.4 05/25/2016 1127   PROT 7.9  05/25/2016 1127   PROT 7.1 05/25/2016 1127   ALBUMIN 3.7 05/25/2016 1127   AST 9 05/25/2016 1127   ALT <9 05/25/2016 1127   ALKPHOS 129 05/25/2016 1127   BILITOT 0.48 05/25/2016 1127   GFRNONAA 26 (L) 06/30/2013 1350   GFRAA 30 (L) 06/30/2013 1350    No results found for: SPEP, UPEP  Lab Results  Component Value Date   WBC 8.4 05/25/2016   NEUTROABS 6.3 05/25/2016   HGB 11.2 (L) 05/25/2016   HCT 34.6 (L) 05/25/2016   MCV 90.0 05/25/2016   PLT 394 05/25/2016      Chemistry      Component Value Date/Time   NA 140 05/25/2016 1127   K 4.2 05/25/2016 1127   CL 103 06/30/2013 1350   CO2 18 (L) 05/25/2016 1127   BUN 35.3 (H) 05/25/2016 1127   CREATININE 2.5 (H) 05/25/2016 1127      Component Value Date/Time   CALCIUM 9.4 05/25/2016 1127   ALKPHOS 129 05/25/2016 1127   AST 9 05/25/2016 1127   ALT <9 05/25/2016 1127   BILITOT 0.48 05/25/2016 1127       RADIOGRAPHIC STUDIES:I reviewed the skeletal survey with her I have personally reviewed the radiological images as listed and agreed with the findings in the report.  ASSESSMENT & PLAN:  MGUS (monoclonal gammopathy of unknown significance) Repeat blood work, 24-hour urine collection and skeletal survey showed normal immunofixation pattern, no detectable abnormal monoclonal protein on immunofixation, along with anemia of chronic renal failure and stage IV chronic kidney disease with estimated GFR of 20 mils per minutes. 24-hour urine collection also showed apparent normal immunofixation pattern with mildly elevated kappa over lambda ratio of 25. Surprisingly, skeletal survey showed evidence of subcentimeter lucencies, worrisome for possible myeloma involvement. In her nephrologist office, 0.3 g of MGUS was detected but not with her recent blood work In my opinion, I felt that the abnormal changes seen on the skeletal survey is unlikely to be related to myeloma. We discussed other imaging studies modality such as MRI of the head  or PET/CT scan for further staging versus bone marrow aspirate and biopsy. Ultimately, she is comfortable to have repeat imaging study done in 6 months along with blood work. I think this is a reasonable approach.  Chronic kidney disease, stage IV (severe) ([facility]) This is likely due to long-standing history of diabetes and hypertension. I would defer to her nephrologist for further management  Abnormal bone xray I reviewed the skeletal survey with her. The abnormal bone lesions described were reviewed in detail. She is not symptomatic and I would recommend observation only. The likelihood of multiple myeloma in the presence of normal blood work and urine test is less than 3%   Orders Placed This Encounter  Procedures  . DG Bone Survey Met    Standing Status:   Future    Standing Expiration Date:   08/15/2017    Order Specific Question:   Reason for Exam (SYMPTOM  OR DIAGNOSIS REQUIRED)    Answer:   staging myeloma    Order Specific  Question:   Preferred imaging location?    Answer:   [facility]  . CBC with Differential/Platelet    Standing Status:   Future    Standing Expiration Date:   08/15/2017  . Comprehensive metabolic panel    Standing Status:   Future    Standing Expiration Date:   08/15/2017  . Kappa/lambda light chains    Standing Status:   Future    Standing Expiration Date:   08/15/2017  . Multiple Myeloma Panel (SPEP&IFE w/QIG)    Standing Status:   Future    Standing Expiration Date:   08/15/2017   All questions were answered. The patient knows to call the clinic with any problems, questions or concerns. No barriers to learning was detected. I spent 15 minutes counseling the patient face to face. The total time spent in the appointment was 20 minutes and more than 50% was on counseling and review of test results     [facility], [place], MD 06/15/2016 1:09 PM

## 2016-06-15 NOTE — Assessment & Plan Note (Signed)
I reviewed the skeletal survey with her. The abnormal bone lesions described were reviewed in detail. She is not symptomatic and I would recommend observation only. The likelihood of multiple myeloma in the presence of normal blood work and urine test is less than 3%

## 2016-06-15 NOTE — Telephone Encounter (Signed)
Avs report and appointment schedule given to patient per 06/15/16 los. °

## 2016-06-15 NOTE — Assessment & Plan Note (Signed)
This is likely due to long-standing history of diabetes and hypertension. I would defer to her nephrologist for further management

## 2016-06-15 NOTE — Assessment & Plan Note (Addendum)
Repeat blood work, 24-hour urine collection and skeletal survey showed normal immunofixation pattern, no detectable abnormal monoclonal protein on immunofixation, along with anemia of chronic renal failure and stage IV chronic kidney disease with estimated GFR of 20 mils per minutes. 24-hour urine collection also showed apparent normal immunofixation pattern with mildly elevated kappa over lambda ratio of 25. Surprisingly, skeletal survey showed evidence of subcentimeter lucencies, worrisome for possible myeloma involvement. In her nephrologist office, 0.3 g of MGUS was detected but not with her recent blood work In my opinion, I felt that the abnormal changes seen on the skeletal survey is unlikely to be related to myeloma. We discussed other imaging studies modality such as MRI of the head or PET/CT scan for further staging versus bone marrow aspirate and biopsy. Ultimately, she is comfortable to have repeat imaging study done in 6 months along with blood work. I think this is a reasonable approach.

## 2016-11-30 ENCOUNTER — Ambulatory Visit (HOSPITAL_COMMUNITY)
Admission: RE | Admit: 2016-11-30 | Discharge: 2016-11-30 | Disposition: A | Discharge status: Home / Self Care | Payer: Medicare Other | Source: Ambulatory Visit | Attending: Hematology and Oncology | Admitting: Hematology and Oncology

## 2016-11-30 DIAGNOSIS — D472 Monoclonal gammopathy: Secondary | ICD-10-CM

## 2016-11-30 DIAGNOSIS — K8020 Calculus of gallbladder without cholecystitis without obstruction: Secondary | ICD-10-CM | POA: Diagnosis not present

## 2016-11-30 DIAGNOSIS — C9000 Multiple myeloma not having achieved remission: Secondary | ICD-10-CM | POA: Diagnosis present

## 2016-11-30 DIAGNOSIS — I7090 Unspecified atherosclerosis: Secondary | ICD-10-CM | POA: Insufficient documentation

## 2016-11-30 DIAGNOSIS — M8580 Other specified disorders of bone density and structure, unspecified site: Secondary | ICD-10-CM | POA: Diagnosis not present

## 2016-12-07 ENCOUNTER — Telehealth: Payer: Self-pay | Admitting: Hematology and Oncology

## 2016-12-07 ENCOUNTER — Other Ambulatory Visit: Payer: Self-pay

## 2016-12-07 NOTE — Telephone Encounter (Signed)
Patient didn't answer left message for patient about appt. R/s. And number to call back to when ready to r/s.

## 2016-12-14 ENCOUNTER — Encounter: Payer: Self-pay | Admitting: Hematology and Oncology

## 2016-12-14 ENCOUNTER — Ambulatory Visit (HOSPITAL_BASED_OUTPATIENT_CLINIC_OR_DEPARTMENT_OTHER): Payer: Medicare Other

## 2016-12-14 ENCOUNTER — Ambulatory Visit (HOSPITAL_BASED_OUTPATIENT_CLINIC_OR_DEPARTMENT_OTHER): Payer: Medicare Other | Admitting: Hematology and Oncology

## 2016-12-14 ENCOUNTER — Telehealth: Payer: Self-pay | Admitting: Hematology and Oncology

## 2016-12-14 DIAGNOSIS — D472 Monoclonal gammopathy: Secondary | ICD-10-CM

## 2016-12-14 DIAGNOSIS — N184 Chronic kidney disease, stage 4 (severe): Secondary | ICD-10-CM | POA: Diagnosis not present

## 2016-12-14 DIAGNOSIS — I10 Essential (primary) hypertension: Secondary | ICD-10-CM | POA: Diagnosis not present

## 2016-12-14 LAB — CBC WITH DIFFERENTIAL/PLATELET
BASO%: 1.3 % (ref 0.0–2.0)
Basophils Absolute: 0.1 10*3/uL (ref 0.0–0.1)
EOS%: 1.8 % (ref 0.0–7.0)
Eosinophils Absolute: 0.2 10*3/uL (ref 0.0–0.5)
HCT: 44.2 % (ref 34.8–46.6)
HGB: 14.3 g/dL (ref 11.6–15.9)
LYMPH%: 19.5 % (ref 14.0–49.7)
MCH: 28.7 pg (ref 25.1–34.0)
MCHC: 32.5 g/dL (ref 31.5–36.0)
MCV: 88.5 fL (ref 79.5–101.0)
MONO#: 0.7 10*3/uL (ref 0.1–0.9)
MONO%: 7.2 % (ref 0.0–14.0)
NEUT#: 6.7 10*3/uL — ABNORMAL HIGH (ref 1.5–6.5)
NEUT%: 70.2 % (ref 38.4–76.8)
Platelets: 429 10*3/uL — ABNORMAL HIGH (ref 145–400)
RBC: 4.99 10*6/uL (ref 3.70–5.45)
RDW: 17.6 % — ABNORMAL HIGH (ref 11.2–14.5)
WBC: 9.5 10*3/uL (ref 3.9–10.3)
lymph#: 1.9 10*3/uL (ref 0.9–3.3)

## 2016-12-14 LAB — COMPREHENSIVE METABOLIC PANEL
ALT: 13 U/L (ref 0–55)
AST: 15 U/L (ref 5–34)
Albumin: 4 g/dL (ref 3.5–5.0)
Alkaline Phosphatase: 153 U/L — ABNORMAL HIGH (ref 40–150)
Anion Gap: 10 mEq/L (ref 3–11)
BUN: 32.8 mg/dL — ABNORMAL HIGH (ref 7.0–26.0)
CO2: 20 mEq/L — ABNORMAL LOW (ref 22–29)
Calcium: 9.5 mg/dL (ref 8.4–10.4)
Chloride: 107 mEq/L (ref 98–109)
Creatinine: 2.1 mg/dL — ABNORMAL HIGH (ref 0.6–1.1)
EGFR: 24 mL/min/{1.73_m2} — ABNORMAL LOW (ref >90)
Glucose: 115 mg/dl (ref 70–140)
Potassium: 4.2 mEq/L (ref 3.5–5.1)
Sodium: 137 mEq/L (ref 136–145)
Total Bilirubin: 0.8 mg/dL (ref 0.20–1.20)
Total Protein: 8.2 g/dL (ref 6.4–8.3)

## 2016-12-14 NOTE — Assessment & Plan Note (Signed)
Repeat blood work, 24-hour urine collection and skeletal survey showed normal immunofixation pattern, no detectable abnormal monoclonal protein on immunofixation, along with anemia of chronic renal failure and stage IV chronic kidney disease with estimated GFR of 20 mils per minutes. 24-hour urine collection also showed apparent normal immunofixation pattern with mildly elevated kappa over lambda ratio of 25.  Her nephrologist office, 0.3 g of MGUS was detected but not with her recent blood work Her original imaging studies show subtle changes.  Repeat skeletal survey did not confirm evidence of  myeloma. She missed her appointment last week. I will draw repeat myeloma panel today and will call her with test results. If repeat myeloma panel is negative, she does not need long-term follow-up.

## 2016-12-14 NOTE — Assessment & Plan Note (Signed)
Her blood pressure is very high but she was late for her appointment. It could be an element of whitecoat hypertension. I would defer to her primary care doctor for further management

## 2016-12-14 NOTE — Assessment & Plan Note (Signed)
This is likely due to long-standing history of diabetes and hypertension. I would defer to her nephrologist for further management

## 2016-12-14 NOTE — Progress Notes (Signed)
[place] OFFICE PROGRESS NOTE  Patient Care Team: Sarah T Martinique, MD as PCP - General (Family Medicine) Edrick Oh, MD as Consulting Physician (Nephrology)  SUMMARY OF ONCOLOGIC HISTORY:  Kristen Berry 61 y.o. female is here because of anemia and abnormal M spike. I review her outside records faxed to [place] from her nephrologist clinic  She was found to have abnormal CBC from blood work dated 07/08/2015. White blood cell count 9.4, hemoglobin 10.3 and platelet count of 401,000. Serum protein electrophoresis detected 0.3 g of abnormal M spike She was initially referred her last year but the patient subsequently did not make it to the appointment. On 03/23/2016, repeat blood work showed white blood cell count of 8.4, hemoglobin of 11.5, platelet count of 402,000. Serum creatinine at 2.54 with normal calcium of 9.0  She denies history of abnormal bone pain or bone fracture. She have chronic bilateral knee pain. The patient have peripheral neuropathy secondary to diabetes. Patient denies history of recurrent infection or atypical infections such as shingles of meningitis. Denies chills, night sweats, anorexia or abnormal weight loss Repeat myeloma panel in 2017 was inconclusive.  She was followed closely  INTERVAL HISTORY: Please see below for problem oriented charting. She returns for further follow-up. She has a counsel her blood work last week. She had recent skeletal survey that was unremarkable. She denies recent infection. No recent bone pain. REVIEW OF SYSTEMS:   Constitutional: Denies fevers, chills or abnormal weight loss Eyes: Denies blurriness of vision Ears, nose, mouth, throat, and face: Denies mucositis or sore throat Respiratory: Denies cough, dyspnea or wheezes Cardiovascular: Denies palpitation, chest discomfort or lower extremity swelling Gastrointestinal:  Denies nausea, heartburn or change in bowel habits Skin: Denies abnormal skin  rashes Lymphatics: Denies new lymphadenopathy or easy bruising Neurological:Denies numbness, tingling or new weaknesses Behavioral/Psych: Mood is stable, no new changes  All other systems were reviewed with the patient and are negative.  I have reviewed the past medical history, past surgical history, social history and family history with the patient and they are unchanged from previous note.  ALLERGIES:  has No Known Allergies.  MEDICATIONS:  Current Outpatient Prescriptions  Medication Sig Dispense Refill  . amLODipine-valsartan (EXFORGE) 10-160 MG per tablet Take 1 tablet by mouth daily.    [name] aspirin EC 81 MG tablet Take 81 mg by mouth daily.    . furosemide (LASIX) 40 MG tablet Take 40 mg by mouth 2 (two) times daily.    [name] gabapentin (NEURONTIN) 100 MG capsule Take 100 mg by mouth daily.    . hydrALAZINE (APRESOLINE) 50 MG tablet Take 50 mg by mouth 2 (two) times daily.    . isosorbide mononitrate (IMDUR) 60 MG 24 hr tablet Take 60 mg by mouth daily.    [name] labetalol (NORMODYNE) 200 MG tablet Take 200 mg by mouth 2 (two) times daily. 2 tablets BID    . rosuvastatin (CRESTOR) 10 MG tablet Take 10 mg by mouth daily.     No current facility-administered medications for this visit.     PHYSICAL EXAMINATION: ECOG PERFORMANCE STATUS: 0 - Asymptomatic  Vitals:   12/14/16 1100  BP: (!) 157/104  Pulse: (!) 111  Resp: 17  Temp: 97.7 F (36.5 C)   Filed Weights   12/14/16 1100  Weight: 232 lb 14.4 oz (105.6 kg)    GENERAL:alert, no distress and comfortable SKIN: skin color, texture, turgor are normal, no rashes or significant lesions EYES: normal, Conjunctiva are pink  and non-injected, sclera clear Musculoskeletal:no cyanosis of digits and no clubbing  NEURO: alert & oriented x 3 with fluent speech, no focal motor/sensory deficits  LABORATORY DATA:  I have reviewed the data as listed    Component Value Date/Time   NA 137 12/14/2016 1139   K 4.2 12/14/2016 1139   CL 103  06/30/2013 1350   CO2 20 (L) 12/14/2016 1139   GLUCOSE 115 12/14/2016 1139   BUN 32.8 (H) 12/14/2016 1139   CREATININE 2.1 (H) 12/14/2016 1139   CALCIUM 9.5 12/14/2016 1139   PROT 8.2 12/14/2016 1139   ALBUMIN 4.0 12/14/2016 1139   AST 15 12/14/2016 1139   ALT 13 12/14/2016 1139   ALKPHOS 153 (H) 12/14/2016 1139   BILITOT 0.80 12/14/2016 1139   GFRNONAA 26 (L) 06/30/2013 1350   GFRAA 30 (L) 06/30/2013 1350    No results found for: SPEP, UPEP  Lab Results  Component Value Date   WBC 9.5 12/14/2016   NEUTROABS 6.7 (H) 12/14/2016   HGB 14.3 12/14/2016   HCT 44.2 12/14/2016   MCV 88.5 12/14/2016   PLT 429 (H) 12/14/2016      Chemistry      Component Value Date/Time   NA 137 12/14/2016 1139   K 4.2 12/14/2016 1139   CL 103 06/30/2013 1350   CO2 20 (L) 12/14/2016 1139   BUN 32.8 (H) 12/14/2016 1139   CREATININE 2.1 (H) 12/14/2016 1139      Component Value Date/Time   CALCIUM 9.5 12/14/2016 1139   ALKPHOS 153 (H) 12/14/2016 1139   AST 15 12/14/2016 1139   ALT 13 12/14/2016 1139   BILITOT 0.80 12/14/2016 1139       RADIOGRAPHIC STUDIES: I have personally reviewed the radiological images as listed and agreed with the findings in the report. Dg Bone Survey Met  Result Date: 11/30/2016 CLINICAL DATA:  Staging myeloma EXAM: METASTATIC BONE SURVEY COMPARISON:  None. FINDINGS: Generalized osteopenia. No definite lytic or blastic lucency. No evidence of pathologic fracture. There is a ill-defined lucency in the upper L5 body in the lateral projection. The superior endplate of L5 is more difficult to see than at L4 in the frontal projection. Generalized spondylosis with endplate spurring greatest in the thoracic spine. Lower lumbar facet arthropathy with grade 1 anterolisthesis at L4-5. Borderline cardiomegaly. Linear opacity over the left mid lung is likely mild scarring. Extensive atherosclerotic calcification. Cholelithiasis. Probable varicose veins in the medial calf.  IMPRESSION: 1. No definitive focus of myeloma. Mild lucency in the upper L5 body is likely projectional. If there are referable symptoms, suggest CT follow-up. 2. Generalized osteopenia. 3. Atherosclerosis, extensive for age. 4. Cholelithiasis. Electronically Signed   By: Monte Fantasia M.D.   On: 11/30/2016 10:07    ASSESSMENT & PLAN:  MGUS (monoclonal gammopathy of unknown significance) Repeat blood work, 24-hour urine collection and skeletal survey showed normal immunofixation pattern, no detectable abnormal monoclonal protein on immunofixation, along with anemia of chronic renal failure and stage IV chronic kidney disease with estimated GFR of 20 mils per minutes. 24-hour urine collection also showed apparent normal immunofixation pattern with mildly elevated kappa over lambda ratio of 25.  Her nephrologist office, 0.3 g of MGUS was detected but not with her recent blood work Her original imaging studies show subtle changes.  Repeat skeletal survey did not confirm evidence of  myeloma. She missed her appointment last week. I will draw repeat myeloma panel today and will call her with test results. If repeat myeloma panel  is negative, she does not need long-term follow-up.  Chronic kidney disease, stage IV (severe) (HCC) This is likely due to long-standing history of diabetes and hypertension. I would defer to her nephrologist for further management  Essential hypertension Her blood pressure is very high but she was late for her appointment. It could be an element of whitecoat hypertension. I would defer to her primary care doctor for further management   No orders of the defined types were placed in this encounter.  All questions were answered. The patient knows to call the clinic with any problems, questions or concerns. No barriers to learning was detected. I spent 15 minutes counseling the patient face to face. The total time spent in the appointment was 20 minutes and more than 50%  was on counseling and review of test results     Heath Lark, MD 12/14/2016 1:50 PM

## 2016-12-14 NOTE — Telephone Encounter (Signed)
Patient sent back to lab. No f/u at this time.

## 2016-12-15 LAB — KAPPA/LAMBDA LIGHT CHAINS
Ig Kappa Free Light Chain: 86.7 mg/L — ABNORMAL HIGH (ref 3.3–19.4)
Ig Lambda Free Light Chain: 48.6 mg/L — ABNORMAL HIGH (ref 5.7–26.3)
Kappa/Lambda FluidC Ratio: 1.78 — ABNORMAL HIGH (ref 0.26–1.65)

## 2016-12-16 LAB — MULTIPLE MYELOMA PANEL, SERUM
Albumin SerPl Elph-Mcnc: 3.6 g/dL (ref 2.9–4.4)
Albumin/Glob SerPl: 0.9 (ref 0.7–1.7)
Alpha 1: 0.3 g/dL (ref 0.0–0.4)
Alpha2 Glob SerPl Elph-Mcnc: 0.9 g/dL (ref 0.4–1.0)
B-Globulin SerPl Elph-Mcnc: 1.2 g/dL (ref 0.7–1.3)
Gamma Glob SerPl Elph-Mcnc: 1.7 g/dL (ref 0.4–1.8)
Globulin, Total: 4.2 g/dL — ABNORMAL HIGH (ref 2.2–3.9)
IgA, Qn, Serum: 394 mg/dL — ABNORMAL HIGH (ref 87–352)
IgG, Qn, Serum: 1363 mg/dL (ref 700–1600)
IgM, Qn, Serum: 112 mg/dL (ref 26–217)
Total Protein: 7.8 g/dL (ref 6.0–8.5)

## 2016-12-29 ENCOUNTER — Telehealth: Payer: Self-pay | Admitting: *Deleted

## 2016-12-29 NOTE — Telephone Encounter (Signed)
Pt left message requesting lab results from 3/20

## 2016-12-29 NOTE — Telephone Encounter (Signed)
Labs are not consistent with MGUS She does not need long term follow-up Please let her know

## 2016-12-29 NOTE — Telephone Encounter (Signed)
Notified of message below

## 2017-09-29 DIAGNOSIS — I743 Embolism and thrombosis of arteries of the lower extremities: Secondary | ICD-10-CM | POA: Diagnosis not present

## 2017-09-29 DIAGNOSIS — I5022 Chronic systolic (congestive) heart failure: Secondary | ICD-10-CM | POA: Diagnosis not present

## 2017-09-29 DIAGNOSIS — J4520 Mild intermittent asthma, uncomplicated: Secondary | ICD-10-CM

## 2017-09-29 DIAGNOSIS — N184 Chronic kidney disease, stage 4 (severe): Secondary | ICD-10-CM

## 2017-09-29 DIAGNOSIS — I739 Peripheral vascular disease, unspecified: Secondary | ICD-10-CM | POA: Diagnosis not present

## 2017-09-29 DIAGNOSIS — I10 Essential (primary) hypertension: Secondary | ICD-10-CM | POA: Diagnosis not present

## 2017-09-29 DIAGNOSIS — M86172 Other acute osteomyelitis, left ankle and foot: Secondary | ICD-10-CM

## 2017-09-30 DIAGNOSIS — J4520 Mild intermittent asthma, uncomplicated: Secondary | ICD-10-CM | POA: Diagnosis not present

## 2017-09-30 DIAGNOSIS — M86172 Other acute osteomyelitis, left ankle and foot: Secondary | ICD-10-CM

## 2017-09-30 DIAGNOSIS — I743 Embolism and thrombosis of arteries of the lower extremities: Secondary | ICD-10-CM | POA: Diagnosis not present

## 2017-09-30 DIAGNOSIS — E1140 Type 2 diabetes mellitus with diabetic neuropathy, unspecified: Secondary | ICD-10-CM

## 2017-09-30 DIAGNOSIS — I5022 Chronic systolic (congestive) heart failure: Secondary | ICD-10-CM | POA: Diagnosis not present

## 2017-09-30 DIAGNOSIS — I739 Peripheral vascular disease, unspecified: Secondary | ICD-10-CM

## 2017-09-30 DIAGNOSIS — N184 Chronic kidney disease, stage 4 (severe): Secondary | ICD-10-CM | POA: Diagnosis not present

## 2017-09-30 DIAGNOSIS — I10 Essential (primary) hypertension: Secondary | ICD-10-CM | POA: Diagnosis not present

## 2017-10-01 DIAGNOSIS — N184 Chronic kidney disease, stage 4 (severe): Secondary | ICD-10-CM | POA: Diagnosis not present

## 2017-10-01 DIAGNOSIS — I739 Peripheral vascular disease, unspecified: Secondary | ICD-10-CM | POA: Diagnosis not present

## 2017-10-01 DIAGNOSIS — M86172 Other acute osteomyelitis, left ankle and foot: Secondary | ICD-10-CM | POA: Diagnosis not present

## 2017-10-01 DIAGNOSIS — I5022 Chronic systolic (congestive) heart failure: Secondary | ICD-10-CM | POA: Diagnosis not present

## 2017-10-02 DIAGNOSIS — I5022 Chronic systolic (congestive) heart failure: Secondary | ICD-10-CM | POA: Diagnosis not present

## 2017-10-02 DIAGNOSIS — N184 Chronic kidney disease, stage 4 (severe): Secondary | ICD-10-CM | POA: Diagnosis not present

## 2017-10-02 DIAGNOSIS — I739 Peripheral vascular disease, unspecified: Secondary | ICD-10-CM | POA: Diagnosis not present

## 2017-10-02 DIAGNOSIS — M86172 Other acute osteomyelitis, left ankle and foot: Secondary | ICD-10-CM | POA: Diagnosis not present

## 2017-10-03 DIAGNOSIS — M86172 Other acute osteomyelitis, left ankle and foot: Secondary | ICD-10-CM | POA: Diagnosis not present

## 2017-10-03 DIAGNOSIS — I5022 Chronic systolic (congestive) heart failure: Secondary | ICD-10-CM | POA: Diagnosis not present

## 2017-10-03 DIAGNOSIS — N184 Chronic kidney disease, stage 4 (severe): Secondary | ICD-10-CM | POA: Diagnosis not present

## 2017-10-03 DIAGNOSIS — I739 Peripheral vascular disease, unspecified: Secondary | ICD-10-CM | POA: Diagnosis not present

## 2017-10-04 DIAGNOSIS — M86172 Other acute osteomyelitis, left ankle and foot: Secondary | ICD-10-CM | POA: Diagnosis not present

## 2017-10-04 DIAGNOSIS — N184 Chronic kidney disease, stage 4 (severe): Secondary | ICD-10-CM | POA: Diagnosis not present

## 2017-10-04 DIAGNOSIS — I739 Peripheral vascular disease, unspecified: Secondary | ICD-10-CM | POA: Diagnosis not present

## 2017-10-04 DIAGNOSIS — I5022 Chronic systolic (congestive) heart failure: Secondary | ICD-10-CM | POA: Diagnosis not present

## 2017-10-07 ENCOUNTER — Encounter: Payer: Self-pay | Admitting: Vascular Surgery

## 2017-10-10 ENCOUNTER — Encounter (HOSPITAL_COMMUNITY): Payer: Self-pay | Admitting: General Practice

## 2017-10-10 ENCOUNTER — Other Ambulatory Visit: Payer: Self-pay

## 2017-10-10 ENCOUNTER — Inpatient Hospital Stay (HOSPITAL_COMMUNITY): Payer: Medicare HMO

## 2017-10-10 ENCOUNTER — Encounter: Payer: Self-pay | Admitting: Internal Medicine

## 2017-10-10 ENCOUNTER — Inpatient Hospital Stay (HOSPITAL_COMMUNITY)
Admission: AD | Admit: 2017-10-10 | Discharge: 2017-11-16 | DRG: 673 | Disposition: A | Discharge status: Home Health | Payer: Medicare HMO | Source: Ambulatory Visit | Attending: Internal Medicine | Admitting: Internal Medicine

## 2017-10-10 DIAGNOSIS — I82443 Acute embolism and thrombosis of tibial vein, bilateral: Secondary | ICD-10-CM | POA: Diagnosis not present

## 2017-10-10 DIAGNOSIS — I132 Hypertensive heart and chronic kidney disease with heart failure and with stage 5 chronic kidney disease, or end stage renal disease: Secondary | ICD-10-CM | POA: Diagnosis present

## 2017-10-10 DIAGNOSIS — I440 Atrioventricular block, first degree: Secondary | ICD-10-CM | POA: Diagnosis present

## 2017-10-10 DIAGNOSIS — M86172 Other acute osteomyelitis, left ankle and foot: Secondary | ICD-10-CM | POA: Diagnosis not present

## 2017-10-10 DIAGNOSIS — I208 Other forms of angina pectoris: Secondary | ICD-10-CM

## 2017-10-10 DIAGNOSIS — G4730 Sleep apnea, unspecified: Secondary | ICD-10-CM | POA: Diagnosis present

## 2017-10-10 DIAGNOSIS — J810 Acute pulmonary edema: Secondary | ICD-10-CM | POA: Diagnosis not present

## 2017-10-10 DIAGNOSIS — I5041 Acute combined systolic (congestive) and diastolic (congestive) heart failure: Secondary | ICD-10-CM | POA: Diagnosis not present

## 2017-10-10 DIAGNOSIS — D631 Anemia in chronic kidney disease: Secondary | ICD-10-CM | POA: Diagnosis present

## 2017-10-10 DIAGNOSIS — E1169 Type 2 diabetes mellitus with other specified complication: Secondary | ICD-10-CM | POA: Diagnosis present

## 2017-10-10 DIAGNOSIS — I255 Ischemic cardiomyopathy: Secondary | ICD-10-CM | POA: Diagnosis present

## 2017-10-10 DIAGNOSIS — J4520 Mild intermittent asthma, uncomplicated: Secondary | ICD-10-CM | POA: Diagnosis present

## 2017-10-10 DIAGNOSIS — D62 Acute posthemorrhagic anemia: Secondary | ICD-10-CM | POA: Diagnosis not present

## 2017-10-10 DIAGNOSIS — E1152 Type 2 diabetes mellitus with diabetic peripheral angiopathy with gangrene: Secondary | ICD-10-CM | POA: Diagnosis not present

## 2017-10-10 DIAGNOSIS — I70202 Unspecified atherosclerosis of native arteries of extremities, left leg: Secondary | ICD-10-CM | POA: Diagnosis present

## 2017-10-10 DIAGNOSIS — E11621 Type 2 diabetes mellitus with foot ulcer: Secondary | ICD-10-CM | POA: Diagnosis present

## 2017-10-10 DIAGNOSIS — L97529 Non-pressure chronic ulcer of other part of left foot with unspecified severity: Secondary | ICD-10-CM | POA: Diagnosis present

## 2017-10-10 DIAGNOSIS — I82441 Acute embolism and thrombosis of right tibial vein: Secondary | ICD-10-CM | POA: Diagnosis not present

## 2017-10-10 DIAGNOSIS — I82491 Acute embolism and thrombosis of other specified deep vein of right lower extremity: Secondary | ICD-10-CM | POA: Diagnosis not present

## 2017-10-10 DIAGNOSIS — I2510 Atherosclerotic heart disease of native coronary artery without angina pectoris: Secondary | ICD-10-CM | POA: Diagnosis present

## 2017-10-10 DIAGNOSIS — N186 End stage renal disease: Secondary | ICD-10-CM | POA: Diagnosis present

## 2017-10-10 DIAGNOSIS — I739 Peripheral vascular disease, unspecified: Secondary | ICD-10-CM | POA: Diagnosis not present

## 2017-10-10 DIAGNOSIS — Z452 Encounter for adjustment and management of vascular access device: Secondary | ICD-10-CM

## 2017-10-10 DIAGNOSIS — I509 Heart failure, unspecified: Secondary | ICD-10-CM | POA: Diagnosis not present

## 2017-10-10 DIAGNOSIS — I82412 Acute embolism and thrombosis of left femoral vein: Secondary | ICD-10-CM | POA: Diagnosis not present

## 2017-10-10 DIAGNOSIS — J811 Chronic pulmonary edema: Secondary | ICD-10-CM

## 2017-10-10 DIAGNOSIS — N184 Chronic kidney disease, stage 4 (severe): Secondary | ICD-10-CM

## 2017-10-10 DIAGNOSIS — D689 Coagulation defect, unspecified: Secondary | ICD-10-CM | POA: Diagnosis not present

## 2017-10-10 DIAGNOSIS — Z01810 Encounter for preprocedural cardiovascular examination: Secondary | ICD-10-CM | POA: Diagnosis not present

## 2017-10-10 DIAGNOSIS — I959 Hypotension, unspecified: Secondary | ICD-10-CM | POA: Diagnosis not present

## 2017-10-10 DIAGNOSIS — N189 Chronic kidney disease, unspecified: Secondary | ICD-10-CM | POA: Diagnosis not present

## 2017-10-10 DIAGNOSIS — E872 Acidosis: Secondary | ICD-10-CM | POA: Diagnosis present

## 2017-10-10 DIAGNOSIS — E1140 Type 2 diabetes mellitus with diabetic neuropathy, unspecified: Secondary | ICD-10-CM | POA: Diagnosis not present

## 2017-10-10 DIAGNOSIS — I10 Essential (primary) hypertension: Secondary | ICD-10-CM | POA: Diagnosis present

## 2017-10-10 DIAGNOSIS — I200 Unstable angina: Secondary | ICD-10-CM | POA: Diagnosis not present

## 2017-10-10 DIAGNOSIS — D72829 Elevated white blood cell count, unspecified: Secondary | ICD-10-CM

## 2017-10-10 DIAGNOSIS — I96 Gangrene, not elsewhere classified: Secondary | ICD-10-CM | POA: Diagnosis not present

## 2017-10-10 DIAGNOSIS — L03116 Cellulitis of left lower limb: Secondary | ICD-10-CM | POA: Diagnosis not present

## 2017-10-10 DIAGNOSIS — I998 Other disorder of circulatory system: Secondary | ICD-10-CM | POA: Diagnosis not present

## 2017-10-10 DIAGNOSIS — N185 Chronic kidney disease, stage 5: Secondary | ICD-10-CM | POA: Diagnosis not present

## 2017-10-10 DIAGNOSIS — N170 Acute kidney failure with tubular necrosis: Secondary | ICD-10-CM | POA: Diagnosis not present

## 2017-10-10 DIAGNOSIS — Z7982 Long term (current) use of aspirin: Secondary | ICD-10-CM

## 2017-10-10 DIAGNOSIS — Z6838 Body mass index (BMI) 38.0-38.9, adult: Secondary | ICD-10-CM

## 2017-10-10 DIAGNOSIS — I5021 Acute systolic (congestive) heart failure: Secondary | ICD-10-CM | POA: Diagnosis not present

## 2017-10-10 DIAGNOSIS — I5043 Acute on chronic combined systolic (congestive) and diastolic (congestive) heart failure: Secondary | ICD-10-CM | POA: Diagnosis present

## 2017-10-10 DIAGNOSIS — N179 Acute kidney failure, unspecified: Secondary | ICD-10-CM | POA: Diagnosis not present

## 2017-10-10 DIAGNOSIS — D472 Monoclonal gammopathy: Secondary | ICD-10-CM | POA: Diagnosis present

## 2017-10-10 DIAGNOSIS — R0602 Shortness of breath: Secondary | ICD-10-CM

## 2017-10-10 DIAGNOSIS — I5023 Acute on chronic systolic (congestive) heart failure: Secondary | ICD-10-CM | POA: Diagnosis not present

## 2017-10-10 DIAGNOSIS — R0600 Dyspnea, unspecified: Secondary | ICD-10-CM | POA: Diagnosis not present

## 2017-10-10 DIAGNOSIS — I420 Dilated cardiomyopathy: Secondary | ICD-10-CM | POA: Diagnosis not present

## 2017-10-10 DIAGNOSIS — I2583 Coronary atherosclerosis due to lipid rich plaque: Secondary | ICD-10-CM | POA: Diagnosis present

## 2017-10-10 DIAGNOSIS — Z419 Encounter for procedure for purposes other than remedying health state, unspecified: Secondary | ICD-10-CM

## 2017-10-10 DIAGNOSIS — E860 Dehydration: Secondary | ICD-10-CM | POA: Diagnosis present

## 2017-10-10 DIAGNOSIS — M86672 Other chronic osteomyelitis, left ankle and foot: Secondary | ICD-10-CM | POA: Diagnosis not present

## 2017-10-10 DIAGNOSIS — I25118 Atherosclerotic heart disease of native coronary artery with other forms of angina pectoris: Secondary | ICD-10-CM | POA: Diagnosis not present

## 2017-10-10 DIAGNOSIS — E119 Type 2 diabetes mellitus without complications: Secondary | ICD-10-CM | POA: Diagnosis present

## 2017-10-10 DIAGNOSIS — Z794 Long term (current) use of insulin: Secondary | ICD-10-CM

## 2017-10-10 DIAGNOSIS — L8990 Pressure ulcer of unspecified site, unspecified stage: Secondary | ICD-10-CM

## 2017-10-10 DIAGNOSIS — Z992 Dependence on renal dialysis: Secondary | ICD-10-CM

## 2017-10-10 DIAGNOSIS — I428 Other cardiomyopathies: Secondary | ICD-10-CM | POA: Diagnosis not present

## 2017-10-10 DIAGNOSIS — E7800 Pure hypercholesterolemia, unspecified: Secondary | ICD-10-CM | POA: Diagnosis present

## 2017-10-10 DIAGNOSIS — Z7984 Long term (current) use of oral hypoglycemic drugs: Secondary | ICD-10-CM

## 2017-10-10 DIAGNOSIS — E1122 Type 2 diabetes mellitus with diabetic chronic kidney disease: Secondary | ICD-10-CM | POA: Diagnosis present

## 2017-10-10 DIAGNOSIS — I272 Pulmonary hypertension, unspecified: Secondary | ICD-10-CM | POA: Diagnosis present

## 2017-10-10 DIAGNOSIS — Z79899 Other long term (current) drug therapy: Secondary | ICD-10-CM

## 2017-10-10 DIAGNOSIS — R079 Chest pain, unspecified: Secondary | ICD-10-CM

## 2017-10-10 DIAGNOSIS — Z9851 Tubal ligation status: Secondary | ICD-10-CM

## 2017-10-10 HISTORY — DX: Chronic kidney disease, stage 4 (severe): N18.4

## 2017-10-10 HISTORY — DX: Type 2 diabetes mellitus without complications: E11.9

## 2017-10-10 HISTORY — DX: Pure hypercholesterolemia, unspecified: E78.00

## 2017-10-10 HISTORY — DX: Peripheral vascular disease, unspecified: I73.9

## 2017-10-10 HISTORY — DX: Unspecified osteoarthritis, unspecified site: M19.90

## 2017-10-10 HISTORY — DX: Sleep apnea, unspecified: G47.30

## 2017-10-10 HISTORY — DX: Personal history of urinary calculi: Z87.442

## 2017-10-10 LAB — RENAL FUNCTION PANEL
Albumin: 2.8 g/dL — ABNORMAL LOW (ref 3.5–5.0)
Anion gap: 15 (ref 5–15)
BUN: 61 mg/dL — ABNORMAL HIGH (ref 6–20)
CO2: 14 mmol/L — ABNORMAL LOW (ref 22–32)
Calcium: 7.4 mg/dL — ABNORMAL LOW (ref 8.9–10.3)
Chloride: 103 mmol/L (ref 101–111)
Creatinine, Ser: 4.18 mg/dL — ABNORMAL HIGH (ref 0.44–1.00)
GFR calc Af Amer: 12 mL/min — ABNORMAL LOW (ref >60)
GFR calc non Af Amer: 10 mL/min — ABNORMAL LOW (ref >60)
Glucose, Bld: 85 mg/dL (ref 65–99)
Phosphorus: 6.3 mg/dL — ABNORMAL HIGH (ref 2.5–4.6)
Potassium: 4.1 mmol/L (ref 3.5–5.1)
Sodium: 132 mmol/L — ABNORMAL LOW (ref 135–145)

## 2017-10-10 LAB — CBC WITH DIFFERENTIAL/PLATELET
Basophils Absolute: 0.1 10*3/uL (ref 0.0–0.1)
Basophils Relative: 0 %
Eosinophils Absolute: 0.2 10*3/uL (ref 0.0–0.7)
Eosinophils Relative: 1 %
HCT: 31.3 % — ABNORMAL LOW (ref 36.0–46.0)
Hemoglobin: 10 g/dL — ABNORMAL LOW (ref 12.0–15.0)
Lymphocytes Relative: 12 %
Lymphs Abs: 1.6 10*3/uL (ref 0.7–4.0)
MCH: 27.7 pg (ref 26.0–34.0)
MCHC: 31.9 g/dL (ref 30.0–36.0)
MCV: 86.7 fL (ref 78.0–100.0)
Monocytes Absolute: 1 10*3/uL (ref 0.1–1.0)
Monocytes Relative: 8 %
Neutro Abs: 10.2 10*3/uL — ABNORMAL HIGH (ref 1.7–7.7)
Neutrophils Relative %: 79 %
Platelets: 541 10*3/uL — ABNORMAL HIGH (ref 150–400)
RBC: 3.61 MIL/uL — ABNORMAL LOW (ref 3.87–5.11)
RDW: 17.7 % — ABNORMAL HIGH (ref 11.5–15.5)
WBC: 13 10*3/uL — ABNORMAL HIGH (ref 4.0–10.5)

## 2017-10-10 MED ORDER — SODIUM CHLORIDE 0.45 % IV SOLN
INTRAVENOUS | Status: DC
  Administered 2017-10-10: 21:00:00 via INTRAVENOUS

## 2017-10-10 MED ORDER — ASPIRIN EC 81 MG PO TBEC
81.0000 mg | DELAYED_RELEASE_TABLET | Freq: Every day | ORAL | Status: DC
  Administered 2017-10-11 – 2017-11-01 (×22): 81 mg via ORAL
  Filled 2017-10-10 (×22): qty 1

## 2017-10-10 MED ORDER — HYDROXYZINE HCL 25 MG PO TABS
25.0000 mg | ORAL_TABLET | Freq: Three times a day (TID) | ORAL | Status: DC | PRN
  Administered 2017-11-09: 25 mg via ORAL
  Filled 2017-10-10 (×3): qty 1

## 2017-10-10 MED ORDER — SODIUM CHLORIDE 0.9% FLUSH
10.0000 mL | INTRAVENOUS | Status: DC | PRN
  Administered 2017-10-13 – 2017-11-05 (×7): 10 mL
  Filled 2017-10-10 (×7): qty 40

## 2017-10-10 MED ORDER — HEPARIN SODIUM (PORCINE) 5000 UNIT/ML IJ SOLN
5000.0000 [IU] | Freq: Three times a day (TID) | INTRAMUSCULAR | Status: DC
  Administered 2017-10-10 – 2017-10-18 (×25): 5000 [IU] via SUBCUTANEOUS
  Filled 2017-10-10 (×25): qty 1

## 2017-10-10 MED ORDER — ZOLPIDEM TARTRATE 5 MG PO TABS
5.0000 mg | ORAL_TABLET | Freq: Every evening | ORAL | Status: DC | PRN
  Administered 2017-10-22 – 2017-11-07 (×7): 5 mg via ORAL
  Filled 2017-10-10 (×8): qty 1

## 2017-10-10 MED ORDER — LABETALOL HCL 100 MG PO TABS
200.0000 mg | ORAL_TABLET | Freq: Two times a day (BID) | ORAL | Status: DC
  Administered 2017-10-11 – 2017-10-18 (×16): 200 mg via ORAL
  Filled 2017-10-10 (×17): qty 2

## 2017-10-10 MED ORDER — ROSUVASTATIN CALCIUM 10 MG PO TABS: 10.0000 mg | ORAL_TABLET | Freq: Every day | ORAL | Status: DC

## 2017-10-10 MED ORDER — HYDRALAZINE HCL 50 MG PO TABS
50.0000 mg | ORAL_TABLET | Freq: Two times a day (BID) | ORAL | Status: DC
  Administered 2017-10-11 – 2017-10-24 (×27): 50 mg via ORAL
  Filled 2017-10-10 (×27): qty 1

## 2017-10-10 MED ORDER — NEPRO/CARBSTEADY PO LIQD: 237.0000 mL | Freq: Three times a day (TID) | ORAL | Status: DC | PRN

## 2017-10-10 MED ORDER — ACETAMINOPHEN 325 MG PO TABS
650.0000 mg | ORAL_TABLET | Freq: Four times a day (QID) | ORAL | Status: DC | PRN
  Administered 2017-10-13 – 2017-10-21 (×4): 650 mg via ORAL
  Filled 2017-10-10 (×4): qty 2

## 2017-10-10 MED ORDER — GABAPENTIN 100 MG PO CAPS
100.0000 mg | ORAL_CAPSULE | Freq: Every day | ORAL | Status: DC
  Administered 2017-10-11 – 2017-11-16 (×35): 100 mg via ORAL
  Filled 2017-10-10 (×36): qty 1

## 2017-10-10 MED ORDER — CALCIUM CARBONATE ANTACID 1250 MG/5ML PO SUSP: 500.0000 mg | Freq: Four times a day (QID) | ORAL | Status: DC | PRN

## 2017-10-10 MED ORDER — DEXTROSE 5 % IV SOLN
1.0000 g | INTRAVENOUS | Status: DC
  Administered 2017-10-10 – 2017-10-11 (×2): 1 g via INTRAVENOUS
  Filled 2017-10-10 (×2): qty 10

## 2017-10-10 MED ORDER — ACETAMINOPHEN 650 MG RE SUPP: 650.0000 mg | Freq: Four times a day (QID) | RECTAL | Status: DC | PRN

## 2017-10-10 MED ORDER — ONDANSETRON HCL 4 MG/2ML IJ SOLN: 4.0000 mg | Freq: Four times a day (QID) | INTRAMUSCULAR | Status: DC | PRN

## 2017-10-10 MED ORDER — SODIUM CHLORIDE 0.9 % IV SOLN
INTRAVENOUS | Status: AC
  Administered 2017-10-10: 23:00:00 via INTRAVENOUS
  Administered 2017-10-11: 1 mL via INTRAVENOUS

## 2017-10-10 MED ORDER — CAMPHOR-MENTHOL 0.5-0.5 % EX LOTN
1.0000 "application " | TOPICAL_LOTION | Freq: Three times a day (TID) | CUTANEOUS | Status: DC | PRN
  Filled 2017-10-10: qty 222

## 2017-10-10 MED ORDER — SORBITOL 70 % SOLN: 30.0000 mL | Status: DC | PRN

## 2017-10-10 MED ORDER — ISOSORBIDE MONONITRATE ER 60 MG PO TB24
60.0000 mg | ORAL_TABLET | Freq: Every day | ORAL | Status: DC
  Administered 2017-10-11 – 2017-10-17 (×7): 60 mg via ORAL
  Filled 2017-10-10 (×8): qty 1

## 2017-10-10 MED ORDER — DOCUSATE SODIUM 283 MG RE ENEM: 1.0000 | ENEMA | RECTAL | Status: DC | PRN

## 2017-10-10 MED ORDER — ONDANSETRON HCL 4 MG PO TABS
4.0000 mg | ORAL_TABLET | Freq: Four times a day (QID) | ORAL | Status: DC | PRN
  Administered 2017-10-22: 4 mg via ORAL
  Filled 2017-10-10: qty 1

## 2017-10-10 NOTE — Progress Notes (Signed)
Admitted to 6N21 from home. Patient is alert and oriented, not in any distress. VSS. Admitting called about the admission and they will call for the MD that patient just arrived.

## 2017-10-10 NOTE — H&P (Signed)
History and Physical    Kristen Berry QBH:[id number] DOB: 1956/01/28 DOA: 10/10/2017  Referring MD/NP/PA: [facility]  PCP: Martinique, Sarah T, MD   Outpatient Specialists: Edrick Oh   Patient coming from: [facility]  Chief Complaint: Worsening renal function  HPI: Kristen Berry is a 62 y.o. female with medical history significant of systolic CHF ( EF 09-73%), peripheral vascular disease and stage 4 chronic kidney disease ( sees Edrick Oh of [facility]) discharged from the hospitalist service at [facility] on 01/08 after 5 day hospital stay for acute osteomyelitis of 2nd toe of left foot with surrounding cellulitis. this is been a chronic issue and patient has been followed by Podiatry. No plans for surgery because of extensive peripheral vascular disease. Discharged on IV Rocephin 2 g Q 24 hr and IV Daptomycin 600 mg Q 24 hr. ( case had been discussed with Infectious Disease at [facility]) Creatinine at 3.0 on discharge ( near patient's baseline). patient continued on antibiotics as ordered, was getting infused daily at [facility]. Follow-up blood work done today noted creatinine of 4.2, so hospitalists were called. patient herself asymptomatic. Vital signs stable. patient does have PICC line put in from few days ago.  Patient here has been stable on arrival with no complaints. Repeat labs are currently pending. Dr. Posey Pronto of nephrology has been consulted.  ED Course: N/A Review of Systems: As per HPI otherwise 10 point review of systems negative.    Past Medical History:  Diagnosis Date  . CHF (congestive heart failure) ([place])   . Chronic kidney disease   . Diabetes mellitus without complication ([place])   . Hypertension     Past Surgical History:  Procedure Laterality Date  . ROTATOR CUFF REPAIR    . TUBAL LIGATION       reports that  has never smoked. she has never used smokeless tobacco. She reports that she does not drink alcohol. Her drug  history is not on file.  No Known Allergies  No family history on file.   Prior to Admission medications   Medication Sig Start Date End Date Taking? Authorizing Provider  amLODipine-valsartan (EXFORGE) 10-160 MG per tablet Take 1 tablet by mouth daily.    [provider]  aspirin EC 81 MG tablet Take 81 mg by mouth daily.    [provider]  furosemide (LASIX) 40 MG tablet Take 40 mg by mouth 2 (two) times daily.    [provider]  gabapentin (NEURONTIN) 100 MG capsule Take 100 mg by mouth daily.    [provider]  hydrALAZINE (APRESOLINE) 50 MG tablet Take 50 mg by mouth 2 (two) times daily.    [provider]  isosorbide mononitrate (IMDUR) 60 MG 24 hr tablet Take 60 mg by mouth daily.    [provider]  labetalol (NORMODYNE) 200 MG tablet Take 200 mg by mouth 2 (two) times daily. 2 tablets BID    [provider]  rosuvastatin (CRESTOR) 10 MG tablet Take 10 mg by mouth daily.    [provider]    Physical Exam: Vitals:   10/10/17 1830  BP: (!) 141/55  Pulse: 62  Resp: 19  Temp: 97.8 F (36.6 C)  TempSrc: Oral  SpO2: 97%  Weight: 106 kg (233 lb 9.6 oz)  Height: 5\' 8"  (1.727 m)      Constitutional: NAD, calm, comfortable Vitals:   10/10/17 1830  BP: (!) 141/55  Pulse: 62  Resp: 19  Temp: 97.8  F (36.6 C)  TempSrc: Oral  SpO2: 97%  Weight: 106 kg (233 lb 9.6 oz)  Height: 5\' 8"  (1.727 m)   Eyes: PERRL, lids and conjunctivae normal ENMT: Mucous membranes are moist. Posterior pharynx clear of any exudate or lesions.Normal dentition.  Neck: normal, supple, no masses, no thyromegaly Respiratory: clear to auscultation bilaterally, no wheezing, no crackles. Normal respiratory effort. No accessory muscle use.  Cardiovascular: Regular rate and rhythm, no murmurs / rubs / gallops. No extremity edema. 2+ pedal pulses. No carotid bruits.  Abdomen: no tenderness, no masses palpated. No  hepatosplenomegaly. Bowel sounds positive.  Musculoskeletal: no clubbing / cyanosis. No joint deformity upper and lower extremities. Good ROM, no contractures. Normal muscle tone.  Skin: no rashes, lesions, ulcers. No induration Neurologic: CN 2-12 grossly intact. Sensation intact, DTR normal. Strength 5/5 in all 4.  Psychiatric: Normal judgment and insight. Alert and oriented x 3. Normal mood.   Labs on Admission: I have personally reviewed following labs and imaging studies  CBC: No results for input(s): WBC, NEUTROABS, HGB, HCT, MCV, PLT in the last 168 hours. Basic Metabolic Panel: No results for input(s): NA, K, CL, CO2, GLUCOSE, BUN, CREATININE, CALCIUM, MG, PHOS in the last 168 hours. GFR: CrCl cannot be calculated (Patient's most recent lab result is older than the maximum 21 days allowed.). Liver Function Tests: No results for input(s): AST, ALT, ALKPHOS, BILITOT, PROT, ALBUMIN in the last 168 hours. No results for input(s): LIPASE, AMYLASE in the last 168 hours. No results for input(s): AMMONIA in the last 168 hours. Coagulation Profile: No results for input(s): INR, PROTIME in the last 168 hours. Cardiac Enzymes: No results for input(s): CKTOTAL, CKMB, CKMBINDEX, TROPONINI in the last 168 hours. BNP (last 3 results) No results for input(s): PROBNP in the last 8760 hours. HbA1C: No results for input(s): HGBA1C in the last 72 hours. CBG: No results for input(s): GLUCAP in the last 168 hours. Lipid Profile: No results for input(s): CHOL, HDL, LDLCALC, TRIG, CHOLHDL, LDLDIRECT in the last 72 hours. Thyroid Function Tests: No results for input(s): TSH, T4TOTAL, FREET4, T3FREE, THYROIDAB in the last 72 hours. Anemia Panel: No results for input(s): VITAMINB12, FOLATE, FERRITIN, TIBC, IRON, RETICCTPCT in the last 72 hours. Urine analysis: No results found for: COLORURINE, APPEARANCEUR, LABSPEC, PHURINE, GLUCOSEU, HGBUR, BILIRUBINUR, KETONESUR, PROTEINUR, UROBILINOGEN, NITRITE,  LEUKOCYTESUR Sepsis Labs: @LABRCNTIP (procalcitonin:4,lacticidven:4) )No results found for this or any previous visit (from the past 240 hour(s)).   Radiological Exams on Admission: No results found.  EKG: Independently reviewed.   Assessment/Plan Principal Problem:   ARF (acute renal failure) (HCC) Active Problems:   Type 2 diabetes mellitus with neurologic complication, without long-term current use of insulin (HCC)   Chronic kidney disease, stage IV (severe) (HCC)   Essential hypertension   Acute kidney injury superimposed on CKD ([place])   #1 acute on chronic renal failure: Patient has was no renal function from her baseline. This could be multifactorial. She is on Rocephin and daptomycin but doubt these as causes. She will be admitted and nephrology consulted. Gentle hydration. Avoid nephrotoxic drugs.  #2 diabetes 2: Non-insulin-dependent. Blood sugar appears control. Hold metformin and continue sliding scale insulin  #3 hypertension: Blood pressure appears control in the moment. Continue home regimen consisting of labetalol Imdur and hydralazine. Hold Lasix  #4 history of CHF: Appears to be diastolic dysfunction. We will be careful with IV fluids.  #5 morbid obesity: The treat counseling.  #6 hyperlipidemia: Continue Crestor from home  DVT prophylaxis: Heparin  Code Status: Full  Family Communication:  no family available. Patient able to make a decision   Disposition Plan:  home when stable   Consults called:  Dr. Graylon Gunning nephrology  Admission status:  inpatient   Severity of Illness: The appropriate patient status for this patient is INPATIENT. Inpatient status is judged to be reasonable and necessary in order to provide the required intensity of service to ensure the patient's safety. The patient's presenting symptoms, physical exam findings, and initial radiographic and laboratory data in the context of their chronic comorbidities is felt to place them at high  risk for further clinical deterioration. Furthermore, it is not anticipated that the patient will be medically stable for discharge from the hospital within 2 midnights of admission. The following factors support the patient status of inpatient.   " The patient's presenting symptoms include weakness. " The worrisome physical exam findings include evidence of dehydration. " The initial radiographic and laboratory data are worrisome because of creatinine of 4.2. " The chronic co-morbidities include diabetes with hypertension.   * I certify that at the point of admission it is my clinical judgment that the patient will require inpatient hospital care spanning beyond 2 midnights from the point of admission due to high intensity of service, high risk for further deterioration and high frequency of surveillance required.Barbette Merino MD [facility] Pager [phone]  If 7PM-7AM, please contact night-coverage [email or web address] Password [facility]  10/10/2017, 7:54 PM

## 2017-10-10 NOTE — Consult Note (Addendum)
Reason for Consult: Acute kidney injury on chronic kidney disease stage IV Referring Physician: Barbette Merino M.D. [facility])  HPI:  62 year old Caucasian woman with past medical history significant for diabetes mellitus, hypertension, monoclonal gammopathy of undetermined significance, history of congestive heart failure (EF unknown), peripheral vascular disease and chronic kidney disease stage IV at baseline (creatinine around 2.4). She follows up with Dr. Edrick Oh at [facility] [place] seen October, 2018.  She was recently discharged from [facility] on 10/04/17 after 5 day hospitalization with acute osteo-myelitis of the second toe left foot with surrounding cellulitis. She is receiving intravenous Rocephin and daptomycin via right upper arm PICC line at the Short Stay C[facility]. She reports that lab check today is significant for an elevation of her creatinine to 4.7 from 3.0 at the time of discharge. She reports some increased weakness, exertional dyspnea, decreased appetite and thirst and decreased urine output over the past few days since her discharge from [facility]. She denies any skin rash, focal joint swelling or redness, hematuria, flank pain or fever or chills. She reported an isolated episode of diarrhea earlier today but denies any nausea or vomiting. Denies any NSAID use. Denies the knowledge of having undergone recent IV contrasted studies. Was compliantly taking her Exforge and furosemide since discharge.  Past Medical History:  Diagnosis Date  . CHF (congestive heart failure) ([place])   . Chronic kidney disease   . Diabetes mellitus without complication ([place])   . Hypertension     Past Surgical History:  Procedure Laterality Date  . ROTATOR CUFF REPAIR    . TUBAL LIGATION      No family history on file.  Social History:  reports that  has never smoked. she has never used smokeless tobacco. She reports that she does not drink alcohol. Her drug history  is not on file.  Allergies: No Known Allergies  Medications:  Scheduled: . aspirin EC  81 mg Oral Daily  . gabapentin  100 mg Oral Daily  . heparin  5,000 Units Subcutaneous Q8H  . [START ON 10/11/2017] hydrALAZINE  50 mg Oral BID  . isosorbide mononitrate  60 mg Oral Daily  . labetalol  200 mg Oral BID  . rosuvastatin  10 mg Oral Daily    No results found for this or any previous visit (from the past 47 hour(s)).  No results found.  Review of Systems  Constitutional: Positive for malaise/fatigue. Negative for chills and fever.  HENT: Negative.   Eyes: Negative.   Respiratory: Positive for shortness of breath. Negative for cough, hemoptysis and wheezing.        Shortness of breath on exertion and some orthopnea  Cardiovascular: Negative for orthopnea, claudication and leg swelling.       Chest tightness  Gastrointestinal: Positive for diarrhea. Negative for abdominal pain, blood in stool, nausea and vomiting.       Report some diarrhea earlier today  Genitourinary: Positive for dysuria. Negative for flank pain and hematuria.       Transient dysuria  Musculoskeletal: Negative for back pain and neck pain.  Skin: Negative.   Neurological: Positive for weakness. Negative for dizziness, tremors, focal weakness and headaches.   Blood pressure (!) 141/55, pulse 62, temperature 97.8 F (36.6 C), temperature source Oral, resp. rate 19, height 5\' 8"  (1.727 m), weight 106 kg (233 lb 9.6 oz), SpO2 97 %. Physical Exam  Nursing note and vitals reviewed. Constitutional: She is oriented to person, place, and time. She appears well-developed  and well-nourished. No distress.  HENT:  Head: Normocephalic and atraumatic.  Mouth/Throat: Oropharynx is clear and moist.  Eyes: EOM are normal. Pupils are equal, round, and reactive to light. No scleral icterus.  Neck: Normal range of motion. Neck supple. No JVD present. No thyromegaly present.  Cardiovascular: Normal rate, regular rhythm and normal  heart sounds.  No murmur heard. Respiratory: Effort normal and breath sounds normal. She has no wheezes. She has no rales.  GI: Soft. Bowel sounds are normal. She exhibits no distension. There is no tenderness. There is no rebound.  Musculoskeletal: She exhibits edema.  Trace-1+ left lower extremity, no edema right leg  Neurological: She is alert and oriented to person, place, and time.  Skin: Skin is warm and dry. Rash noted. No erythema.  Venous stasis changes left leg    Assessment/Plan: 1. Acute kidney injury on chronic kidney disease stage IV: from the history, timeline of events and available data base, highly suspicious for hemodynamically mediated acute kidney injury. Agree with lab recheck to verify renal function/electrolytes. Will order for urinalysis and urine electrolytes and repeat labs again tomorrow morning following intravenous fluids. Unfortunately, there are no records available from her recent hospitalization at [facility] and this have been requested. Clinically, she appears to be doing well and is euvolemic on physical exam and without any concerning symptoms or signs suggestive of uremia. Urinalysis will be instrumental in identifying whether this is a post infectious GN. Will check complement levels. Hold ARB and diuretics at this time. Avoid nephrotoxins including NSAIDs and avoid iodinated intravenous contrast exposure. Limit hypotensive episodes to allow for renal recovery. 2. Osteomyelitis second left toe/cellulitis: ongoing intravenous antibiotic therapy with ceftriaxone and daptomycin- no clear nephrotoxins noted. 3. Hypertension: Hold ARB and furosemide at this time with up titration of labetalol and hydralazine if required. 4. History of congestive heart failure: no echocardiogram report available but admission note reviewed significant for possible diastolic dysfunction-maintain caution on intravenous fluids. 5. Diabetes mellitus: Management per primary  service.  Brayden Betters K. 10/10/2017, 9:16 PM

## 2017-10-11 LAB — URINALYSIS, ROUTINE W REFLEX MICROSCOPIC
Bacteria, UA: NONE SEEN
Bilirubin Urine: NEGATIVE
Glucose, UA: NEGATIVE mg/dL
Hgb urine dipstick: NEGATIVE
Ketones, ur: NEGATIVE mg/dL
Nitrite: NEGATIVE
Protein, ur: NEGATIVE mg/dL
Specific Gravity, Urine: 1.012 (ref 1.005–1.030)
pH: 5 (ref 5.0–8.0)

## 2017-10-11 LAB — CREATININE, URINE, RANDOM: Creatinine, Urine: 134.01 mg/dL

## 2017-10-11 LAB — RENAL FUNCTION PANEL
Albumin: 2.7 g/dL — ABNORMAL LOW (ref 3.5–5.0)
Anion gap: 16 — ABNORMAL HIGH (ref 5–15)
BUN: 62 mg/dL — ABNORMAL HIGH (ref 6–20)
CO2: 14 mmol/L — ABNORMAL LOW (ref 22–32)
Calcium: 7.4 mg/dL — ABNORMAL LOW (ref 8.9–10.3)
Chloride: 104 mmol/L (ref 101–111)
Creatinine, Ser: 4.17 mg/dL — ABNORMAL HIGH (ref 0.44–1.00)
GFR calc Af Amer: 12 mL/min — ABNORMAL LOW (ref >60)
GFR calc non Af Amer: 11 mL/min — ABNORMAL LOW (ref >60)
Glucose, Bld: 84 mg/dL (ref 65–99)
Phosphorus: 6.8 mg/dL — ABNORMAL HIGH (ref 2.5–4.6)
Potassium: 4.4 mmol/L (ref 3.5–5.1)
Sodium: 134 mmol/L — ABNORMAL LOW (ref 135–145)

## 2017-10-11 LAB — CBC
HCT: 29.9 % — ABNORMAL LOW (ref 36.0–46.0)
Hemoglobin: 9.8 g/dL — ABNORMAL LOW (ref 12.0–15.0)
MCH: 28.2 pg (ref 26.0–34.0)
MCHC: 32.8 g/dL (ref 30.0–36.0)
MCV: 85.9 fL (ref 78.0–100.0)
Platelets: 507 10*3/uL — ABNORMAL HIGH (ref 150–400)
RBC: 3.48 MIL/uL — ABNORMAL LOW (ref 3.87–5.11)
RDW: 17.6 % — ABNORMAL HIGH (ref 11.5–15.5)
WBC: 12 10*3/uL — ABNORMAL HIGH (ref 4.0–10.5)

## 2017-10-11 LAB — SODIUM, URINE, RANDOM: Sodium, Ur: 36 mmol/L

## 2017-10-11 LAB — HIV ANTIBODY (ROUTINE TESTING W REFLEX): HIV Screen 4th Generation wRfx: NONREACTIVE

## 2017-10-11 NOTE — Progress Notes (Signed)
  [facility] Progress Note    Assessment/ Plan:   1. Acute kidney injury on chronic kidney disease stage IV: from the history, timeline of events and available data base, highly suspicious for hemodynamically mediated acute kidney injury. 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 Complements pending. Hold ARB and diuretics at this time. Avoid nephrotoxins including NSAIDs and avoid iodinated intravenous contrast exposure. Limit hypotensive episodes to allow for renal recovery.  Baseline cr appears to be 2.4 2. Osteomyelitis second left toe/cellulitis: ongoing intravenous antibiotic therapy with ceftriaxone and daptomycin- no clear nephrotoxins noted. 3. Hypertension: Hold ARB and furosemide at this time with up titration of labetalol and hydralazine if required. 4. History of congestive heart failure: no echocardiogram report available but admission note reviewed significant for possible diastolic dysfunction-maintain caution on intravenous fluids. 5. Diabetes mellitus: Management per primary service.    Subjective:    Pt is feeling fairly well.  No real complaints today. Cr is still in the 4.1 range.     Objective:   BP (!) 143/53 (BP Location: Left Arm)   Pulse (!) 53   Temp 98.3 F (36.8 C) (Oral)   Resp 18   Ht 5\' 8"  (1.727 m)   Wt 106.3 kg (234 lb 5.6 oz)   SpO2 97%   BMI 35.63 kg/m   Intake/Output Summary (Last 24 hours) at 10/11/2017 1358 Last data filed at 10/11/2017 0513 Gross per 24 hour  Intake 1025 ml  Output 100 ml  Net 925 ml   Weight change:   Physical Exam: Gen: NAD, lying in bed comfortably CVS: RRR no m/r/g Resp: clear bilaterally Abd: soft, notender Ext: no LE edema, RUE PICC in place MSK: L foot dressed   Imaging: Dg Chest 1 View  Result Date: 10/10/2017 CLINICAL DATA:  62 year old female status post PICC placement. EXAM: CHEST 1 VIEW COMPARISON:  Chest radiograph dated 12/13/2014 FINDINGS: Right-sided PICC with tip over central SVC.  Mild cardiomegaly with mild vascular congestion diffuse interstitial prominence, likely mild edema. There is no focal consolidation, pleural effusion, or pneumothorax. No acute osseous pathology. IMPRESSION: 1. Right-sided PICC with tip over central SVC. 2. Mild cardiomegaly with vascular congestion and mild edema. No focal consolidation. Electronically Signed   By: Anner Crete M.D.   On: 10/10/2017 22:33    Labs: BMET Recent Labs  Lab 10/10/17 2120 10/11/17 0424  NA 132* 134*  K 4.1 4.4  CL 103 104  CO2 14* 14*  GLUCOSE 85 84  BUN 61* 62*  CREATININE 4.18* 4.17*  CALCIUM 7.4* 7.4*  PHOS 6.3* 6.8*   CBC Recent Labs  Lab 10/10/17 2120 10/11/17 0424  WBC 13.0* 12.0*  NEUTROABS 10.2*  --   HGB 10.0* 9.8*  HCT 31.3* 29.9*  MCV 86.7 85.9  PLT 541* 507*    Medications:    . aspirin EC  81 mg Oral Daily  . gabapentin  100 mg Oral Daily  . heparin  5,000 Units Subcutaneous Q8H  . hydrALAZINE  50 mg Oral BID  . isosorbide mononitrate  60 mg Oral Daily  . labetalol  200 mg Oral BID      Madelon Lips, MD [facility] pgr [phone] 10/11/2017, 1:58 PM

## 2017-10-11 NOTE — Progress Notes (Signed)
PROGRESS NOTE [facility]   Kristen Berry   MGN:[id number] DOB: 1955-11-22  DOA: 10/10/2017 PCP: Martinique, Sarah T, MD   Brief Narrative:  Kristen Berry is a 62 year old female with medical history significant of systolic heart failure, peripheral vascular disease, CKD stage IV and recently diagnosed with osteomyelitis discharge from [facility] on 1/8.  Patient was discharged on daptomycin and Rocephin after discussing with surgery and no surgical procedure was recommended due to peripheral vascular disease.  On outpatient workup her creatinine noted to be 4.2.  Patient was referred to [facility] for further evaluation and nephrology workup.    Subjective: Patient seen and examined, she does not any complaints. Afebrile   Assessment & Plan: Acute kidney injury on CKD stage IV Patient with good urine output, avoid nephrotoxic agents, nephrology following doing full workup.  UA negative for GN  Left 2nd toe Osteomyelitis  Continue Daptomycin and Rocephin  Stable   Diabetes mellitus type 2 CBGs seems to be stable Holding metformin Continue SSRI  Hypertension BP under control Continue home medications Holding Lasix  History of diastolic dysfunction EF 91-69% Holding Lasix  Seems to be compensated at this time  Morbidly obese Nutrition support  DVT prophylaxis: Heparin  Code Status: Full Code  Family Communication: None  Disposition Plan: TBD pending nephrology w/u   Consultants:   None   Procedures:   None   Antimicrobials:  None    Objective: Vitals:   10/10/17 2256 10/10/17 2353 10/11/17 0508 10/11/17 1320  BP: (!) 142/48 (!) 142/48 (!) 153/54 (!) 143/53  Pulse: 66 (!) 58 64 (!) 53  Resp: 19  20 18   Temp: 97.6 F (36.4 C)  98.3 F (36.8 C) 98.3 F (36.8 C)  TempSrc: Oral  Oral Oral  SpO2: 99%  98% 97%  Weight:   106.3 kg (234 lb 5.6 oz)   Height:        Intake/Output Summary (Last 24 hours) at 10/11/2017 1548 Last data filed  at 10/11/2017 0513 Gross per 24 hour  Intake 1025 ml  Output 100 ml  Net 925 ml   Filed Weights   10/10/17 1830 10/11/17 0508  Weight: 106 kg (233 lb 9.6 oz) 106.3 kg (234 lb 5.6 oz)    Examination:  General: Pt is alert, awake, not in acute distress Cardiovascular: RRR, S1/S2 +, no rubs, no gallops Respiratory: CTA bilaterally, no wheezing, no rhonchi Abdominal: Soft, NT, ND, bowel sounds + Extremities: no edema, left toe with dressing c/d/i    Data Reviewed: I have personally reviewed following labs and imaging studies  CBC: Recent Labs  Lab 10/10/17 2120 10/11/17 0424  WBC 13.0* 12.0*  NEUTROABS 10.2*  --   HGB 10.0* 9.8*  HCT 31.3* 29.9*  MCV 86.7 85.9  PLT 541* 450*   Basic Metabolic Panel: Recent Labs  Lab 10/10/17 2120 10/11/17 0424  NA 132* 134*  K 4.1 4.4  CL 103 104  CO2 14* 14*  GLUCOSE 85 84  BUN 61* 62*  CREATININE 4.18* 4.17*  CALCIUM 7.4* 7.4*  PHOS 6.3* 6.8*   GFR: Estimated Creatinine Clearance: 17.9 mL/min (A) (by C-G formula based on SCr of 4.17 mg/dL (H)). Liver Function Tests: Recent Labs  Lab 10/10/17 2120 10/11/17 0424  ALBUMIN 2.8* 2.7*   No results for input(s): LIPASE, AMYLASE in the last 168 hours. No results for input(s): AMMONIA in the last 168 hours. Coagulation Profile: No results for input(s): INR, PROTIME in the last 168 hours.  Cardiac Enzymes: No results for input(s): CKTOTAL, CKMB, CKMBINDEX, TROPONINI in the last 168 hours. BNP (last 3 results) No results for input(s): PROBNP in the last 8760 hours. HbA1C: No results for input(s): HGBA1C in the last 72 hours. CBG: No results for input(s): GLUCAP in the last 168 hours. Lipid Profile: No results for input(s): CHOL, HDL, LDLCALC, TRIG, CHOLHDL, LDLDIRECT in the last 72 hours. Thyroid Function Tests: No results for input(s): TSH, T4TOTAL, FREET4, T3FREE, THYROIDAB in the last 72 hours. Anemia Panel: No results for input(s): VITAMINB12, FOLATE, FERRITIN, TIBC,  IRON, RETICCTPCT in the last 72 hours. Sepsis Labs: No results for input(s): PROCALCITON, LATICACIDVEN in the last 168 hours.  No results found for this or any previous visit (from the past 240 hour(s)).    Radiology Studies: Dg Chest 1 View  Result Date: 10/10/2017 CLINICAL DATA:  62 year old female status post PICC placement. EXAM: CHEST 1 VIEW COMPARISON:  Chest radiograph dated 12/13/2014 FINDINGS: Right-sided PICC with tip over central SVC. Mild cardiomegaly with mild vascular congestion diffuse interstitial prominence, likely mild edema. There is no focal consolidation, pleural effusion, or pneumothorax. No acute osseous pathology. IMPRESSION: 1. Right-sided PICC with tip over central SVC. 2. Mild cardiomegaly with vascular congestion and mild edema. No focal consolidation. Electronically Signed   By: Anner Crete M.D.   On: 10/10/2017 22:33      Scheduled Meds: . aspirin EC  81 mg Oral Daily  . gabapentin  100 mg Oral Daily  . heparin  5,000 Units Subcutaneous Q8H  . hydrALAZINE  50 mg Oral BID  . isosorbide mononitrate  60 mg Oral Daily  . labetalol  200 mg Oral BID   Continuous Infusions: . sodium chloride 1 mL (10/11/17 1220)  . cefTRIAXone (ROCEPHIN)  IV Stopped (10/10/17 2352)     LOS: 1 day    Time spent: Total of 15 minutes spent with pt, greater than 50% of which was spent in discussion of  treatment, counseling and coordination of care   Chipper Oman, MD Pager: Text Page via [email or web address]   If 7PM-7AM, please contact night-coverage [email or web address] 10/11/2017, 3:48 PM

## 2017-10-11 NOTE — Consult Note (Signed)
[place] Nurse wound consult note Reason for Consult: toe wound Patient reports she is followed at [facility] daily for IV antibiotics for her toe wound. She has a PICC line in place. She is performing wound care at home under the direction of her primary care MD. She been admitted for worsening renal failure and has know PAD. She is known to have osteomyelitis of the 2nd toe, with no plans for surgical intervention due to her PAD.   Wound type: arterial in the presence of DM  Pressure Injury POA:NA Measurement: 1.2cm x 2.0cm x 0cm Wound bed: 100% eschar Drainage (amount, consistency, odor) none Periwound:intact, some edema, weak pulses  Dressing procedure/placement/frequency: Continue with POC established by PCP, paint toe with betadine daily. Allow to air dry and cover with dry dressing. Daily. Orders updated.   FU with PCP as outpatient for wound care.  Discussed POC with patient and bedside nurse.  Re consult if needed, will not follow at this time. Thanks  Zyliah Schier [employer], RN,CWOCN, CNS, [place] [phone])

## 2017-10-12 LAB — UREA NITROGEN, URINE: Urea Nitrogen, Ur: 385 mg/dL

## 2017-10-12 LAB — BASIC METABOLIC PANEL
Anion gap: 16 — ABNORMAL HIGH (ref 5–15)
BUN: 61 mg/dL — ABNORMAL HIGH (ref 6–20)
CO2: 14 mmol/L — ABNORMAL LOW (ref 22–32)
Calcium: 7.3 mg/dL — ABNORMAL LOW (ref 8.9–10.3)
Chloride: 103 mmol/L (ref 101–111)
Creatinine, Ser: 3.79 mg/dL — ABNORMAL HIGH (ref 0.44–1.00)
GFR calc Af Amer: 14 mL/min — ABNORMAL LOW (ref >60)
GFR calc non Af Amer: 12 mL/min — ABNORMAL LOW (ref >60)
Glucose, Bld: 81 mg/dL (ref 65–99)
Potassium: 4.3 mmol/L (ref 3.5–5.1)
Sodium: 133 mmol/L — ABNORMAL LOW (ref 135–145)

## 2017-10-12 LAB — C4 COMPLEMENT: Complement C4, Body Fluid: 36 mg/dL (ref 14–44)

## 2017-10-12 LAB — C3 COMPLEMENT: C3 Complement: 145 mg/dL (ref 82–167)

## 2017-10-12 LAB — MAGNESIUM: Magnesium: 1.9 mg/dL (ref 1.7–2.4)

## 2017-10-12 LAB — PHOSPHORUS: Phosphorus: 6.1 mg/dL — ABNORMAL HIGH (ref 2.5–4.6)

## 2017-10-12 LAB — ALBUMIN: Albumin: 2.5 g/dL — ABNORMAL LOW (ref 3.5–5.0)

## 2017-10-12 MED ORDER — SODIUM CHLORIDE 0.9 % IV SOLN
INTRAVENOUS | Status: DC
  Administered 2017-10-12: 21:00:00 via INTRAVENOUS

## 2017-10-12 MED ORDER — SODIUM CHLORIDE 0.9 % IV SOLN
660.0000 mg | INTRAVENOUS | Status: DC
  Administered 2017-10-12 – 2017-10-18 (×4): 660 mg via INTRAVENOUS
  Filled 2017-10-12 (×6): qty 13.2

## 2017-10-12 MED ORDER — SODIUM CHLORIDE 0.9 % IV SOLN
660.0000 mg | INTRAVENOUS | Status: DC
  Filled 2017-10-12: qty 13.2

## 2017-10-12 MED ORDER — WHITE PETROLATUM EX OINT
TOPICAL_OINTMENT | CUTANEOUS | Status: AC
  Administered 2017-10-12: 21:00:00
  Filled 2017-10-12: qty 28.35

## 2017-10-12 MED ORDER — DEXTROSE 5 % IV SOLN
2.0000 g | INTRAVENOUS | Status: DC
  Administered 2017-10-12 – 2017-11-06 (×23): 2 g via INTRAVENOUS
  Filled 2017-10-12 (×28): qty 2

## 2017-10-12 NOTE — Progress Notes (Signed)
  [facility] Progress Note    Assessment/ Plan:    1. Acute kidney injury on chronic kidney disease stage IV: from the history, timeline of events and available data base, highly suspicious for hemodynamically mediated acute kidney injury. 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 Complements normal. Hold ARB and diuretics at this time. Avoid nephrotoxins including NSAIDs and avoid iodinated intravenous contrast exposure. Limit hypotensive episodes to allow for renal recovery.  Would continue gentle fluids for now and assess Cr in AM.  If improving even more could go home with close nephrology f/u.  Baseline cr appears to be 2.4  2. Osteomyelitis second left toe/cellulitis: ongoing intravenous antibiotic therapy with ceftriaxone and daptomycin  3. Hypertension: Hold ARB and furosemide at this time with up titration of labetalol and hydralazine if required.  4. History of congestive heart failure: no echocardiogram report available but admission note reviewed significant for possible diastolic dysfunction-maintain caution on intravenous fluids.  Appears euvolemic at this time.  5. Diabetes mellitus: Management per primary service.    Subjective:    Cr improving slightly.   Objective:   BP (!) 141/57 (BP Location: Left Arm)   Pulse 95   Temp (!) 97.5 F (36.4 C) (Oral)   Resp 18   Ht 5\' 8"  (7.253 m)   Wt 110.6 kg (243 lb 13.3 oz)   SpO2 98%   BMI 37.07 kg/m   Intake/Output Summary (Last 24 hours) at 10/12/2017 1406 Last data filed at 10/12/2017 1405 Gross per 24 hour  Intake 2297.5 ml  Output -  Net 2297.5 ml   Weight change: 4.64 kg (10 lb 3.7 oz)  Physical Exam: Gen: NAD, lying in bed comfortably CVS: RRR no m/r/g Resp: clear bilaterally Abd: soft, notender Ext: no LE edema, RUE PICC in place MSK: L foot dressed   Imaging: Dg Chest 1 View  Result Date: 10/10/2017 CLINICAL DATA:  62 year old female status post PICC placement. EXAM: CHEST 1 VIEW  COMPARISON:  Chest radiograph dated 12/13/2014 FINDINGS: Right-sided PICC with tip over central SVC. Mild cardiomegaly with mild vascular congestion diffuse interstitial prominence, likely mild edema. There is no focal consolidation, pleural effusion, or pneumothorax. No acute osseous pathology. IMPRESSION: 1. Right-sided PICC with tip over central SVC. 2. Mild cardiomegaly with vascular congestion and mild edema. No focal consolidation. Electronically Signed   By: Anner Crete M.D.   On: 10/10/2017 22:33    Labs: BMET Recent Labs  Lab 10/10/17 2120 10/11/17 0424 10/12/17 0449  NA 132* 134* 133*  K 4.1 4.4 4.3  CL 103 104 103  CO2 14* 14* 14*  GLUCOSE 85 84 81  BUN 61* 62* 61*  CREATININE 4.18* 4.17* 3.79*  CALCIUM 7.4* 7.4* 7.3*  PHOS 6.3* 6.8* 6.1*   CBC Recent Labs  Lab 10/10/17 2120 10/11/17 0424  WBC 13.0* 12.0*  NEUTROABS 10.2*  --   HGB 10.0* 9.8*  HCT 31.3* 29.9*  MCV 86.7 85.9  PLT 541* 507*    Medications:    . aspirin EC  81 mg Oral Daily  . gabapentin  100 mg Oral Daily  . heparin  5,000 Units Subcutaneous Q8H  . hydrALAZINE  50 mg Oral BID  . isosorbide mononitrate  60 mg Oral Daily  . labetalol  200 mg Oral BID      Madelon Lips, MD [place] pgr [phone] 10/12/2017, 2:06 PM

## 2017-10-12 NOTE — Progress Notes (Signed)
Pharmacy Antibiotic Note  Kristen Berry is a 62 y.o. female admitted on 10/10/2017 with worsening renal function.  Of note, patient was admitted to [facility] earlier this month and was discharged home on Cubicin and Rocephin for osteomyelitis of the left 2nd toe.  Per patient, she has been going to [place] daily to get her IV antibiotics and has missed her dose on 10/09/17 due to the ice/snow.  Pharmacy has been consulted for Cubicin dosing.  Patient has baseline CKD4.  Her renal function is starting to improve since admission.  She is afebrile and her WBC is elevated at 12.   Plan: Change Cubicin to 660mg  IV Q48H (~8 mg/kg AdjBW) Change Rocephin to 2gm IV Q24H Monitor renal fxn, clinical progress, weekly CK   Height: 5\' 8"  (172.7 cm) Weight: 243 lb 13.3 oz (110.6 kg) IBW/kg (Calculated) : 63.9  AdjBW = 83 kg  Temp (24hrs), Avg:98 F (36.7 C), Min:97.5 F (36.4 C), Max:98.3 F (36.8 C)  Recent Labs  Lab 10/10/17 2120 10/11/17 0424 10/12/17 0449  WBC 13.0* 12.0*  --   CREATININE 4.18* 4.17* 3.79*    Estimated Creatinine Clearance: 20.1 mL/min (A) (by C-G formula based on SCr of 3.79 mg/dL (H)).    Allergies  Allergen Reactions  . Crestor [Rosuvastatin Calcium] Other (See Comments)    Leg pain     Vanc 1/3 PTA >> 10-13-22 Cubicin October 13, 2022 PTA >> (2/15) CTX 1/6 PTA >> (2/15)   Loyd Salvador D. Mina Marble, PharmD, BCPS Pager:  [phone] 10/12/2017, 1:34 PM

## 2017-10-12 NOTE — Care Management Note (Addendum)
Case Management Note  Patient Details  Name: Kristen Berry MRN: [id number] Date of Birth: 10-Jul-1956  Subjective/Objective:                    Action/Plan:  Spoke to patient at bedside . Patient from home with PICC, she has been going to [facility] Monday through Friday and to [facility] ED Saturday and Sundays for daily Daptomycin and daily Rocephin end date Nov 11, 2017 patient wants to continue this at discharge.    Spoke to [place] at [place] phone [phone] and fax [phone], when patient is discharged from [place] will need new orders for IV ABX. At discharge they will have to be called to schedule a patient , prescription will have to be faxed and original prescription will have to be sent with patient.   Paged Dr Quincy Simmonds Expected Discharge Date:                  Expected Discharge Plan:  Home/Self Care  In-House Referral:     Discharge planning Services     Post Acute Care Choice:  NA Choice offered to:  Patient  DME Arranged:  N/A DME Agency:  NA  HH Arranged:  NA HH Agency:  NA  Status of Service:  In process, will continue to follow  If discussed at Long Length of Stay Meetings, dates discussed:    Additional Comments:  Marilu Favre, RN 10/12/2017, 11:21 AM

## 2017-10-12 NOTE — Progress Notes (Addendum)
PROGRESS NOTE [facility]   Kristen Berry   NFA:[id number] DOB: 02-Jul-1956  DOA: 10/10/2017 PCP: Martinique, Sarah T, MD   Brief Narrative:  Kristen Berry is a 62 year old female with medical history significant of systolic heart failure, peripheral vascular disease, CKD stage IV and recently diagnosed with osteomyelitis discharge from [facility] on 1/8.  Patient was discharged on daptomycin and Rocephin after discussing with surgery and no surgical procedure was recommended due to peripheral vascular disease.  On outpatient workup her creatinine noted to be 4.2.  Patient was referred to [facility] for further evaluation and nephrology workup.    Subjective: Patient remains asymptomatic. No acute events overnight    Assessment & Plan: Acute kidney injury on CKD stage IV FeNa+ 0.8% subjective of pre-renal, PTA on Lasix and ARB ? Progressive  Will continue gentle hydration, still far from baseline Cr ( ~2.5)  Patient with good urine output, avoid nephrotoxic agents, nephrology following doing full workup. UA unlikely GN, complements negative  Nephrology recommendations appreciated   Left 2nd toe Osteomyelitis - Stable  Not candidate for surgical procedure at this time due to PVD - She follow with ortho as outpatient  She is schedule for vascular studies to evaluated the possibility of revascularization as outpatient  If decompensate consider doing workup while in hospital, ortho and vascular, otherwise this can be done as outpatient.  Continue home Daptomycin and Rocephin  Afebrile   Diabetes mellitus type 2 CBG's stable  Diet controlled  Check A1C   Hypertension BP stable, avoid hypotension to allow renal recovery  Continue home medications Holding Lasix  History of diastolic dysfunction EF 46-96% Holding Lasix  Seems to be compensated at this time  Morbidly obese Nutrition support  DVT prophylaxis: Heparin  Code Status: Full Code  Family  Communication: None  Disposition Plan: Home when Cr closer to baseline   Consultants:   None   Procedures:   None   Antimicrobials:  None    Objective: Vitals:   10/11/17 0508 10/11/17 1320 10/11/17 2147 10/12/17 0432  BP: (!) 153/54 (!) 143/53 (!) 153/63   Pulse: 64 (!) 53 63   Resp: 20 18 18    Temp: 98.3 F (36.8 C) 98.3 F (36.8 C) 98.2 F (36.8 C)   TempSrc: Oral Oral Oral   SpO2: 98% 97% 97%   Weight: 106.3 kg (234 lb 5.6 oz)   110.6 kg (243 lb 13.3 oz)  Height:        Intake/Output Summary (Last 24 hours) at 10/12/2017 0906 Last data filed at 10/12/2017 0843 Gross per 24 hour  Intake 2045 ml  Output -  Net 2045 ml   Filed Weights   10/10/17 1830 10/11/17 0508 10/12/17 0432  Weight: 106 kg (233 lb 9.6 oz) 106.3 kg (234 lb 5.6 oz) 110.6 kg (243 lb 13.3 oz)    Examination:  General: Pt is alert, awake, not in acute distress Cardiovascular: RRR, S1/S2 +, no rubs, no gallops Respiratory: CTA bilaterally, no wheezing, no rhonchi Abdominal: Soft, NT, ND, bowel sounds + Extremities:       Data Reviewed: I have personally reviewed following labs and imaging studies  CBC: Recent Labs  Lab 10/10/17 2120 10/11/17 0424  WBC 13.0* 12.0*  NEUTROABS 10.2*  --   HGB 10.0* 9.8*  HCT 31.3* 29.9*  MCV 86.7 85.9  PLT 541* 295*   Basic Metabolic Panel: Recent Labs  Lab 10/10/17 2120 10/11/17 0424 10/12/17 0449  NA 132* 134* 133*  K 4.1 4.4 4.3  CL 103 104 103  CO2 14* 14* 14*  GLUCOSE 85 84 81  BUN 61* 62* 61*  CREATININE 4.18* 4.17* 3.79*  CALCIUM 7.4* 7.4* 7.3*  MG  --   --  1.9  PHOS 6.3* 6.8* 6.1*   GFR: Estimated Creatinine Clearance: 20.1 mL/min (A) (by C-G formula based on SCr of 3.79 mg/dL (H)). Liver Function Tests: Recent Labs  Lab 10/10/17 2120 10/11/17 0424 10/12/17 0449  ALBUMIN 2.8* 2.7* 2.5*   No results for input(s): LIPASE, AMYLASE in the last 168 hours. No results for input(s): AMMONIA in the last 168  hours. Coagulation Profile: No results for input(s): INR, PROTIME in the last 168 hours. Cardiac Enzymes: No results for input(s): CKTOTAL, CKMB, CKMBINDEX, TROPONINI in the last 168 hours. BNP (last 3 results) No results for input(s): PROBNP in the last 8760 hours. HbA1C: No results for input(s): HGBA1C in the last 72 hours. CBG: No results for input(s): GLUCAP in the last 168 hours. Lipid Profile: No results for input(s): CHOL, HDL, LDLCALC, TRIG, CHOLHDL, LDLDIRECT in the last 72 hours. Thyroid Function Tests: No results for input(s): TSH, T4TOTAL, FREET4, T3FREE, THYROIDAB in the last 72 hours. Anemia Panel: No results for input(s): VITAMINB12, FOLATE, FERRITIN, TIBC, IRON, RETICCTPCT in the last 72 hours. Sepsis Labs: No results for input(s): PROCALCITON, LATICACIDVEN in the last 168 hours.  No results found for this or any previous visit (from the past 240 hour(s)).    Radiology Studies: Dg Chest 1 View  Result Date: 10/10/2017 CLINICAL DATA:  62 year old female status post PICC placement. EXAM: CHEST 1 VIEW COMPARISON:  Chest radiograph dated 12/13/2014 FINDINGS: Right-sided PICC with tip over central SVC. Mild cardiomegaly with mild vascular congestion diffuse interstitial prominence, likely mild edema. There is no focal consolidation, pleural effusion, or pneumothorax. No acute osseous pathology. IMPRESSION: 1. Right-sided PICC with tip over central SVC. 2. Mild cardiomegaly with vascular congestion and mild edema. No focal consolidation. Electronically Signed   By: Anner Crete M.D.   On: 10/10/2017 22:33      Scheduled Meds: . aspirin EC  81 mg Oral Daily  . gabapentin  100 mg Oral Daily  . heparin  5,000 Units Subcutaneous Q8H  . hydrALAZINE  50 mg Oral BID  . isosorbide mononitrate  60 mg Oral Daily  . labetalol  200 mg Oral BID   Continuous Infusions: . sodium chloride    . cefTRIAXone (ROCEPHIN)  IV Stopped (10/11/17 2123)     LOS: 2 days    Time  spent: Total of 15 minutes spent with pt, greater than 50% of which was spent in discussion of  treatment, counseling and coordination of care   Chipper Oman, MD Pager: Text Page via [email or web address]   If 7PM-7AM, please contact night-coverage [email or web address] 10/12/2017, 9:06 AM

## 2017-10-13 ENCOUNTER — Inpatient Hospital Stay (HOSPITAL_COMMUNITY): Payer: Medicare HMO

## 2017-10-13 DIAGNOSIS — M86172 Other acute osteomyelitis, left ankle and foot: Secondary | ICD-10-CM

## 2017-10-13 LAB — RENAL FUNCTION PANEL
Albumin: 2.8 g/dL — ABNORMAL LOW (ref 3.5–5.0)
Anion gap: 15 (ref 5–15)
BUN: 65 mg/dL — ABNORMAL HIGH (ref 6–20)
CO2: 14 mmol/L — ABNORMAL LOW (ref 22–32)
Calcium: 7.3 mg/dL — ABNORMAL LOW (ref 8.9–10.3)
Chloride: 106 mmol/L (ref 101–111)
Creatinine, Ser: 3.5 mg/dL — ABNORMAL HIGH (ref 0.44–1.00)
GFR calc Af Amer: 15 mL/min — ABNORMAL LOW (ref >60)
GFR calc non Af Amer: 13 mL/min — ABNORMAL LOW (ref >60)
Glucose, Bld: 92 mg/dL (ref 65–99)
Phosphorus: 6 mg/dL — ABNORMAL HIGH (ref 2.5–4.6)
Potassium: 4.8 mmol/L (ref 3.5–5.1)
Sodium: 135 mmol/L (ref 135–145)

## 2017-10-13 LAB — CK: Total CK: 80 U/L (ref 38–234)

## 2017-10-13 LAB — CBC
HCT: 29.9 % — ABNORMAL LOW (ref 36.0–46.0)
Hemoglobin: 9.5 g/dL — ABNORMAL LOW (ref 12.0–15.0)
MCH: 27.7 pg (ref 26.0–34.0)
MCHC: 31.8 g/dL (ref 30.0–36.0)
MCV: 87.2 fL (ref 78.0–100.0)
Platelets: 595 10*3/uL — ABNORMAL HIGH (ref 150–400)
RBC: 3.43 MIL/uL — ABNORMAL LOW (ref 3.87–5.11)
RDW: 17.7 % — ABNORMAL HIGH (ref 11.5–15.5)
WBC: 15.3 10*3/uL — ABNORMAL HIGH (ref 4.0–10.5)

## 2017-10-13 LAB — HEMOGLOBIN A1C
Hgb A1c MFr Bld: 5.5 % (ref 4.8–5.6)
Mean Plasma Glucose: 111.15 mg/dL

## 2017-10-13 MED ORDER — IPRATROPIUM-ALBUTEROL 0.5-2.5 (3) MG/3ML IN SOLN
3.0000 mL | Freq: Four times a day (QID) | RESPIRATORY_TRACT | Status: AC | PRN
  Administered 2017-10-13 (×2): 3 mL via RESPIRATORY_TRACT
  Filled 2017-10-13 (×2): qty 3

## 2017-10-13 NOTE — Progress Notes (Signed)
PHARMACY CONSULT NOTE FOR:  OUTPATIENT  PARENTERAL ANTIBIOTIC THERAPY (OPAT)  Indication: osteomyelitis of the left 2nd toe Regimen: Rocephin 2gm IV Q24H and Cubicin 650mg  IV Q48H End date: 11/11/17 per patient  IV antibiotic discharge orders are pended. To discharging provider:  please sign these orders via discharge navigator,  Select New Orders & click on the button choice - Manage This Unsigned Work.     Kristen Berry, PharmD, BCPS Pager:  [phone] 10/13/2017, 12:25 PM

## 2017-10-13 NOTE — Progress Notes (Signed)
Pharmacy Antibiotic Note  Kristen Berry is a 62 y.o. female admitted on 10/10/2017 with worsening renal function.  Of note, patient was admitted to [facility] earlier this month and was discharged home on Cubicin and Rocephin for osteomyelitis of the left 2nd toe.  Per patient, she has been going to [place] daily to get her IV antibiotics and has missed her dose on 10/09/17 due to the ice/snow.  Pharmacy has been consulted for Cubicin dosing.  Patient has baseline CKD4 and her renal function is improving.  She is afebrile but her WBC is trending up.  CK is WNL.   Plan: Continue Cubicin 660mg  IV Q48H (~8 mg/kg AdjBW) Continue CTX 2gm IV Q24H  Monitor renal fxn, clinical progress, CK q-Thurs   Height: 5\' 8"  (172.7 cm) Weight: 243 lb 13.3 oz (110.6 kg) IBW/kg (Calculated) : 63.9  AdjBW = 83 kg  Temp (24hrs), Avg:98 F (36.7 C), Min:97.6 F (36.4 C), Max:98.4 F (36.9 C)  Recent Labs  Lab 10/10/17 2120 10/11/17 0424 10/12/17 0449 10/13/17 0325  WBC 13.0* 12.0*  --  15.3*  CREATININE 4.18* 4.17* 3.79* 3.50*    Estimated Creatinine Clearance: 21.7 mL/min (A) (by C-G formula based on SCr of 3.5 mg/dL (H)).    Allergies  Allergen Reactions  . Crestor [Rosuvastatin Calcium] Other (See Comments)    Leg pain     Vanc 1/3 PTA >> 10/10/2022 Cubicin 10/10/2022 PTA >> (2/15) CTX 1/6 PTA >> (2/15)  1/17 CK = 80   Kristen Berry D. Mina Marble, PharmD, BCPS Pager:  [phone] 10/13/2017, 10:04 AM

## 2017-10-13 NOTE — Progress Notes (Signed)
  [facility] Progress Note    Assessment/ Plan:    1. Acute kidney injury on chronic kidney disease stage IV: from the history, timeline of events and available data base, highly suspicious for hemodynamically mediated acute kidney injury. 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 Complements normal. Hold ARB and diuretics at this time. Avoid nephrotoxins including NSAIDs and avoid iodinated intravenous contrast exposure. Limit hypotensive episodes to allow for renal recovery. Agree with d/c of fluids.  If improving even more could go home with close nephrology f/u- will arrange with Dr. Justin Mend.  Baseline cr appears to be 2.4  2. Osteomyelitis second left toe/cellulitis: ongoing intravenous antibiotic therapy with ceftriaxone and daptomycin  3. Hypertension: Hold ARB and furosemide at this time with up titration of meds  4. History of congestive heart failure: no echocardiogram report available but admission note reviewed significant for possible diastolic dysfunction. Appears euvolemic at this time.  5. Diabetes mellitus: Management per primary service.    Subjective:    Cr improving still at 3.5 but not to baseline.  Had episode of SOB last night   Objective:   BP (!) 147/64 (BP Location: Left Arm)   Pulse (!) 102   Temp 98.4 F (36.9 C) (Oral)   Resp 20   Ht 5\' 8"  (1.727 m)   Wt 110.6 kg (243 lb 13.3 oz)   SpO2 95%   BMI 37.07 kg/m   Intake/Output Summary (Last 24 hours) at 10/13/2017 1215 Last data filed at 10/13/2017 0530 Gross per 24 hour  Intake 2006.95 ml  Output -  Net 2006.95 ml   Weight change:   Physical Exam: Gen: NAD, lying in bed comfortably CVS: RRR no m/r/g Resp: clear bilaterally Abd: soft, notender Ext: no LE edema, RUE PICC in place MSK: L foot dressed   Imaging: No results found.  Labs: BMET Recent Labs  Lab 10/10/17 2120 10/11/17 0424 10/12/17 0449 10/13/17 0325  NA 132* 134* 133* 135  K 4.1 4.4 4.3 4.8  CL 103 104 103  106  CO2 14* 14* 14* 14*  GLUCOSE 85 84 81 92  BUN 61* 62* 61* 65*  CREATININE 4.18* 4.17* 3.79* 3.50*  CALCIUM 7.4* 7.4* 7.3* 7.3*  PHOS 6.3* 6.8* 6.1* 6.0*   CBC Recent Labs  Lab 10/10/17 2120 10/11/17 0424 10/13/17 0325  WBC 13.0* 12.0* 15.3*  NEUTROABS 10.2*  --   --   HGB 10.0* 9.8* 9.5*  HCT 31.3* 29.9* 29.9*  MCV 86.7 85.9 87.2  PLT 541* 507* 595*    Medications:    . aspirin EC  81 mg Oral Daily  . gabapentin  100 mg Oral Daily  . heparin  5,000 Units Subcutaneous Q8H  . hydrALAZINE  50 mg Oral BID  . isosorbide mononitrate  60 mg Oral Daily  . labetalol  200 mg Oral BID      Madelon Lips, MD [facility] pgr [phone] 10/13/2017, 12:15 PM

## 2017-10-13 NOTE — Progress Notes (Signed)
PROGRESS NOTE    Kristen Berry  WJX:[id number] DOB: October 31, 1955 DOA: 10/10/2017 PCP: Martinique, Sarah T, MD   Brief Narrative: 62 year old female with history of peripheral vascular disease, chronic kidney disease stage IV with baseline creatinine around 2.4, recently diagnosed with left toe osteomyelitis at [facility] and discharged with IV daptomycin and ceftriaxone.  Patient was receiving IV antibiotics daily at [facility] before transfer here.  As per prior note, it was discussed with the surgery and no surgical procedure was recommended due to peripheral vascular disease.  The plan was to follow-up with vascular and orthopedics as outpatient.  Assessment & Plan:  #Acute kidney injury on CKD stage IV: Likely hemodynamically mediated. UA unremarkable.  Complements normal.  Serum creatinine level trending down.  Monitor BMP.  Avoid nephrotoxins.  Losartan and Lasix on hold.  Nephrology consult appreciated. -I will order renal ultrasound  #Left second toe osteomyelitis: As per prior progress note, she will follow-up with orthopedics outpatient and plan for vascular procedures.  She was recently discharged from [facility] with IV daptomycin and ceftriaxone.  As per patient she was getting IV antibiotics daily at the hospital and the plan is to continue until November 11, 2017.  I have discussed with the case manager.  The [facility] is requesting the prescription for antibiotics to resume after discharge.  I consulted pharmacist.  I discussed with the patient to continue to follow-up with surgeons.  #Type 2 diabetes: Continue to monitor blood sugar level.  A1c 5.5.  # Hypertension: Holding losartan and Lasix.  Monitor blood pressure.  #History of CHF as per prior note, type unspecified: No echo result available to review.  Looks euvolemic on exam.  I will review patient's chart if there is any test results available.  Currently on Imdur, labetalol.  Morbid  obesity  DVT prophylaxis: Heparin subcutaneous Code Status: Full code Family Communication: No family at bedside Disposition Plan: Likely discharge home in 1-2 days     Consultants:   None  Procedures: None Antimicrobials: Ceftriaxone and daptomycin  Subjective: Seen and examined at bedside.  Denies headache, nausea, vomiting, chest pain or shortness of breath.  Objective: Vitals:   10/13/17 0221 10/13/17 0249 10/13/17 0252 10/13/17 0519  BP:   (!) 142/68 (!) 147/64  Pulse: (!) 105 (!) 102 (!) 103 (!) 102  Resp:   (!) 22 20  Temp:    98.4 F (36.9 C)  TempSrc:    Oral  SpO2: 98% 95% 95% 95%  Weight:      Height:        Intake/Output Summary (Last 24 hours) at 10/13/2017 1142 Last data filed at 10/13/2017 0530 Gross per 24 hour  Intake 2006.95 ml  Output -  Net 2006.95 ml   Filed Weights   10/10/17 1830 10/11/17 0508 10/12/17 0432  Weight: 106 kg (233 lb 9.6 oz) 106.3 kg (234 lb 5.6 oz) 110.6 kg (243 lb 13.3 oz)    Examination:  General exam: Appears calm and comfortable  Respiratory system: Clear to auscultation. Respiratory effort normal. No wheezing or crackle Cardiovascular system: S1 & S2 heard, RRR.  No pedal edema. Gastrointestinal system: Abdomen is nondistended, soft and nontender. Normal bowel sounds heard. Central nervous system: Alert and oriented. No focal neurological deficits. Extremities: Left second toe dry gangrenous change Skin: No rashes, lesions or ulcers Psychiatry: Judgement and insight appear normal. Mood & affect appropriate.     Data Reviewed: I have personally reviewed following labs and imaging studies  CBC: Recent Labs  Lab 10/10/17 2120 10/11/17 0424 10/13/17 0325  WBC 13.0* 12.0* 15.3*  NEUTROABS 10.2*  --   --   HGB 10.0* 9.8* 9.5*  HCT 31.3* 29.9* 29.9*  MCV 86.7 85.9 87.2  PLT 541* 507* 638*   Basic Metabolic Panel: Recent Labs  Lab 10/10/17 2120 10/11/17 0424 10/12/17 0449 10/13/17 0325  NA 132* 134* 133*  135  K 4.1 4.4 4.3 4.8  CL 103 104 103 106  CO2 14* 14* 14* 14*  GLUCOSE 85 84 81 92  BUN 61* 62* 61* 65*  CREATININE 4.18* 4.17* 3.79* 3.50*  CALCIUM 7.4* 7.4* 7.3* 7.3*  MG  --   --  1.9  --   PHOS 6.3* 6.8* 6.1* 6.0*   GFR: Estimated Creatinine Clearance: 21.7 mL/min (A) (by C-G formula based on SCr of 3.5 mg/dL (H)). Liver Function Tests: Recent Labs  Lab 10/10/17 2120 10/11/17 0424 10/12/17 0449 10/13/17 0325  ALBUMIN 2.8* 2.7* 2.5* 2.8*   No results for input(s): LIPASE, AMYLASE in the last 168 hours. No results for input(s): AMMONIA in the last 168 hours. Coagulation Profile: No results for input(s): INR, PROTIME in the last 168 hours. Cardiac Enzymes: Recent Labs  Lab 10/13/17 0325  CKTOTAL 80   BNP (last 3 results) No results for input(s): PROBNP in the last 8760 hours. HbA1C: Recent Labs    10/13/17 0325  HGBA1C 5.5   CBG: No results for input(s): GLUCAP in the last 168 hours. Lipid Profile: No results for input(s): CHOL, HDL, LDLCALC, TRIG, CHOLHDL, LDLDIRECT in the last 72 hours. Thyroid Function Tests: No results for input(s): TSH, T4TOTAL, FREET4, T3FREE, THYROIDAB in the last 72 hours. Anemia Panel: No results for input(s): VITAMINB12, FOLATE, FERRITIN, TIBC, IRON, RETICCTPCT in the last 72 hours. Sepsis Labs: No results for input(s): PROCALCITON, LATICACIDVEN in the last 168 hours.  No results found for this or any previous visit (from the past 240 hour(s)).       Radiology Studies: No results found.      Scheduled Meds: . aspirin EC  81 mg Oral Daily  . gabapentin  100 mg Oral Daily  . heparin  5,000 Units Subcutaneous Q8H  . hydrALAZINE  50 mg Oral BID  . isosorbide mononitrate  60 mg Oral Daily  . labetalol  200 mg Oral BID   Continuous Infusions: . cefTRIAXone (ROCEPHIN)  IV Stopped (10/12/17 1545)  . DAPTOmycin (CUBICIN)  IV Stopped (10/12/17 1545)     LOS: 3 days    Zeya Balles Tanna Furry, MD [facility] Pager [phone]  If 7PM-7AM, please contact night-coverage [email or web address] Password [facility] 10/13/2017, 11:42 AM

## 2017-10-13 NOTE — Progress Notes (Signed)
Pt c/o of SOB. VS as follows:BP=135/71, P=102, O2= 96 on RA, RR=24bpm. Pt denies any chest pain. Made Kristen Corpus NP aware. Duoneb ordered and given. Will continue to monitor pt.

## 2017-10-14 ENCOUNTER — Inpatient Hospital Stay (HOSPITAL_COMMUNITY): Payer: Medicare HMO

## 2017-10-14 DIAGNOSIS — Z452 Encounter for adjustment and management of vascular access device: Secondary | ICD-10-CM

## 2017-10-14 LAB — BASIC METABOLIC PANEL
Anion gap: 15 (ref 5–15)
BUN: 62 mg/dL — ABNORMAL HIGH (ref 6–20)
CO2: 13 mmol/L — ABNORMAL LOW (ref 22–32)
Calcium: 7.7 mg/dL — ABNORMAL LOW (ref 8.9–10.3)
Chloride: 107 mmol/L (ref 101–111)
Creatinine, Ser: 3.5 mg/dL — ABNORMAL HIGH (ref 0.44–1.00)
GFR calc Af Amer: 15 mL/min — ABNORMAL LOW (ref >60)
GFR calc non Af Amer: 13 mL/min — ABNORMAL LOW (ref >60)
Glucose, Bld: 67 mg/dL (ref 65–99)
Potassium: 4.5 mmol/L (ref 3.5–5.1)
Sodium: 135 mmol/L (ref 135–145)

## 2017-10-14 MED ORDER — CEFTRIAXONE IV (FOR PTA / DISCHARGE USE ONLY): 2.0000 g | INTRAVENOUS | 0 refills | Status: DC

## 2017-10-14 MED ORDER — DAPTOMYCIN IV (FOR PTA / DISCHARGE USE ONLY): 650.0000 mg | INTRAVENOUS | 0 refills | Status: DC

## 2017-10-14 MED ORDER — SODIUM BICARBONATE 650 MG PO TABS: 1300.0000 mg | ORAL_TABLET | Freq: Two times a day (BID) | ORAL | 0 refills | Status: DC

## 2017-10-14 MED ORDER — FUROSEMIDE 40 MG PO TABS
40.0000 mg | ORAL_TABLET | Freq: Every day | ORAL | Status: DC
  Administered 2017-10-14: 40 mg via ORAL
  Filled 2017-10-14: qty 1

## 2017-10-14 MED ORDER — IPRATROPIUM-ALBUTEROL 0.5-2.5 (3) MG/3ML IN SOLN
3.0000 mL | Freq: Four times a day (QID) | RESPIRATORY_TRACT | Status: DC | PRN
  Administered 2017-10-14 – 2017-10-25 (×6): 3 mL via RESPIRATORY_TRACT
  Filled 2017-10-14 (×7): qty 3

## 2017-10-14 MED ORDER — SODIUM BICARBONATE 650 MG PO TABS
1300.0000 mg | ORAL_TABLET | Freq: Two times a day (BID) | ORAL | Status: DC
  Administered 2017-10-14 – 2017-10-18 (×10): 1300 mg via ORAL
  Filled 2017-10-14 (×10): qty 2

## 2017-10-14 NOTE — Discharge Summary (Signed)
Physician Discharge Summary  CHRISTMAS FARACI INO:[id number] DOB: 05-Aug-1956 DOA: 10/10/2017  PCP: Martinique, Sarah T, MD  Admit date: 10/10/2017 Discharge date: 10/14/2017  Admitted From:home Disposition:home  Recommendations for Outpatient Follow-up:  1. Follow up with PCP in 1-2 weeks 2. Please obtain BMP/CBC in one week 3. Continue IV antibiotics on discharge and follow-up with PCP and infectious disease. 4. Please follow-up with your vascular surgeon.   Home Health: No Equipment/Devices: Continue PICC line on discharge Discharge Condition: Stable CODE STATUS: Full code Diet recommendation: Heart healthy  Brief/Interim Summary: 62 year old female with history of peripheral vascular disease, chronic kidney disease stage IV with baseline creatinine around 2.4, recently diagnosed with left toe osteomyelitis at [facility] and discharged with IV daptomycin and ceftriaxone.  Patient was receiving IV antibiotics daily at [facility] before transfer here.  As per prior note, it was discussed with the surgery and no surgical procedure was recommended due to peripheral vascular disease.  The plan was to follow-up with vascular and orthopedics as outpatient.  #Acute kidney injury on CKD stage IV: Likely hemodynamically mediated. UA unremarkable.  Complements normal.  Serum creatinine level trending down and is stable at 3.5.  Discussed with the nephrologist.  Planning to hold losartan and Lasix on discharge.  She has a follow-up appointment with a nephrologist.  Recommended low-salt diet.  Renal ultrasound consistent with chronic kidney disease.  #Left second toe osteomyelitis: As per prior progress note, she will follow-up with orthopedics outpatient and plan for vascular procedures.  She was recently discharged from [facility] with IV daptomycin and ceftriaxone.  As per patient she was getting IV antibiotics daily at the hospital and the plan is to continue until November 11, 2017.   I have discussed with the case manager.  The [facility] is requesting the prescription for antibiotics to resume after discharge.  I consulted pharmacist.  The resumption order for antibiotics prescribed on discharge.  I have discussed with the patient at bedside in detail regarding importance of following up with infectious disease vascular and orthopedics.  She verbalized understanding.   #Type 2 diabetes: Continue to monitor blood sugar level.  A1c 5.5.  # Hypertension: Holding losartan and Lasix.  Monitor blood pressure.  #History of CHF as per prior note, type unspecified: No echo result available to review.  Looks euvolemic on exam.   Currently on Imdur, labetalol.  Morbid obesity  Discharge Diagnoses:  Principal Problem:   ARF (acute renal failure) (HCC) Active Problems:   Type 2 diabetes mellitus with neurologic complication, without long-term current use of insulin (HCC)   Chronic kidney disease, stage IV (severe) (HCC)   Essential hypertension   Acute kidney injury superimposed on CKD (HCC)   Acute osteomyelitis of toe of left foot [facility])    Discharge Instructions  Discharge Instructions    Call MD for:  difficulty breathing, headache or visual disturbances   Complete by:  As directed    Call MD for:  extreme fatigue   Complete by:  As directed    Call MD for:  hives   Complete by:  As directed    Call MD for:  persistant dizziness or light-headedness   Complete by:  As directed    Call MD for:  persistant nausea and vomiting   Complete by:  As directed    Call MD for:  severe uncontrolled pain   Complete by:  As directed    Call MD for:  temperature >100.4   Complete by:  As directed    Diet - low sodium heart healthy   Complete by:  As directed    Discharge instructions   Complete by:  As directed    Please continue to follow-up with your vascular surgeon and orthopedics as planned.   Home infusion instructions [facility] May follow [place] Dosing Protocol; May administer Cathflo as needed to maintain patency of vascular access device.; Flushing of vascular access device: per [facility] Protocol: 0.9% NaCl pre/post medica...   Complete by:  As directed    This is the resumption of your IV antibiotics that you were receiving before admission.  Please follow-up with your PCP and recommended to follow-up with infectious disease.   Instructions:  May follow [place] Dosing Protocol   Instructions:  May administer Cathflo as needed to maintain patency of vascular access device.   Instructions:  Flushing of vascular access device: per [facility] Protocol: 0.9% NaCl pre/post medication administration and prn patency; Heparin 100 u/ml, 54m for implanted ports and Heparin 10u/ml, 535mfor all other central venous catheters.   Instructions:  May follow [facility] Anaphylaxis Protocol for First Dose Administration in the home: 0.9% NaCl at 25-50 ml/hr to maintain IV access for protocol meds. Epinephrine 0.3 ml IV/IM PRN and Benadryl 25-50 IV/IM PRN s/s of anaphylaxis.   Instructions:  [facility] Coord[facility] (RN) to assist per patient IV care needs in the home PRN.   Increase activity slowly   Complete by:  As directed      Allergies as of 10/14/2017      Reactions   Crestor [rosuvastatin Calcium] Other (See Comments)   Leg pain      Medication List    STOP taking these medications   furosemide 40 MG tablet Commonly known as:  LASIX   losartan 100 MG tablet Commonly known as:  COZAAR     TAKE these medications   acetaminophen 500 MG tablet Commonly known as:  TYLENOL Take 1,500 mg by mouth at bedtime as needed for headache (pain).   amLODipine 10 MG tablet Commonly known as:  NORVASC Take 10 mg by mouth at bedtime.   aspirin EC 81 MG tablet Take 81 mg by mouth at bedtime.   cefTRIAXone IVPB Commonly known as:  ROCEPHIN Inject 2 g into the vein daily. Indication:  osteomyelitis Last Day of Therapy:  11/11/17 Labs -  Once weekly:  CBC/D and BMP, Labs - Every other week:  ESR and CRP   daptomycin IVPB Commonly known as:  CUBICIN Inject 650 mg into the vein every other day. Indication:  Osteomyelitis  Last Day of Therapy:  11/11/17 Labs - Once weekly:  CBC/D, BMP, and CPK Labs - Every other week:  ESR and CRP   gabapentin 100 MG capsule Commonly known as:  NEURONTIN Take 100 mg by mouth at bedtime as needed (nerve pain).   hydrALAZINE 50 MG tablet Commonly known as:  APRESOLINE Take 50 mg by mouth 2 (two) times daily.   isosorbide mononitrate 60 MG 24 hr tablet Commonly known as:  IMDUR Take 60 mg by mouth at bedtime.   labetalol 200 MG tablet Commonly known as:  NORMODYNE Take 400 mg by mouth 2 (two) times daily. 2 tablets BID   PRESCRIPTION MEDICATION Inject into the vein daily at 12 noon.   PROAIR HFA 108 (90 Base) MCG/ACT inhaler Generic drug:  albuterol Inhale 2 puffs into the lungs every 6 (six) hours as needed for wheezing or shortness of  breath.   sodium bicarbonate 650 MG tablet Take 2 tablets (1,300 mg total) by mouth 2 (two) times daily.            Home Infusion Instuctions  (From admission, onward)        Start     Ordered   10/14/17 0000  Home infusion instructions [facility] May follow [place] Dosing Protocol; May administer Cathflo as needed to maintain patency of vascular access device.; Flushing of vascular access device: per [facility] Protocol: 0.9% NaCl pre/post medica...    Comments:  This is the resumption of your IV antibiotics that you were receiving before admission.  Please follow-up with your PCP and recommended to follow-up with infectious disease.  Question Answer Comment  Instructions May follow [place] Dosing Protocol   Instructions May administer Cathflo as needed to maintain patency of vascular access device.   Instructions Flushing of vascular access device: per [facility] Protocol: 0.9% NaCl pre/post medication administration and prn patency;  Heparin 100 u/ml, 1m for implanted ports and Heparin 10u/ml, 580mfor all other central venous catheters.   Instructions May follow [facility] Anaphylaxis Protocol for First Dose Administration in the home: 0.9% NaCl at 25-50 ml/hr to maintain IV access for protocol meds. Epinephrine 0.3 ml IV/IM PRN and Benadryl 25-50 IV/IM PRN s/s of anaphylaxis.   Instructions [facility] Infusion Coordinator (RN) to assist per patient IV care needs in the home PRN.      10/14/17 1133     Follow-up Information    WeEdrick OhMD Follow up on 11/01/2017.   Specialty:  Nephrology Why:  at 10:45.  Please call to confirm the appointment. Contact information: [phone]        JoMartiniqueSarah T, MD. Schedule an appointment as soon as possible for a visit in 1 week(s).   Specialty:  Family Medicine Contact information: [address] [phone]        Allergies  Allergen Reactions  . Crestor [Rosuvastatin Calcium] Other (See Comments)    Leg pain    Consultations: Nephrology  Procedures/Studies: None  Subjective: Seen and examined at bedside.  Doing well.  Denied headache, dizziness, nausea vomiting chest pain shortness of breath.  Discharge Exam: Vitals:   10/13/17 2038 10/14/17 0510  BP: (!) 142/80 138/70  Pulse: (!) 102 98  Resp: 20 18  Temp: 97.7 F (36.5 C) 97.9 F (36.6 C)  SpO2: 96% 94%   Vitals:   10/13/17 1219 10/13/17 1300 10/13/17 2038 10/14/17 0510  BP:  (!) 108/59 (!) 142/80 138/70  Pulse:  97 (!) 102 98  Resp:  '20 20 18  ' Temp:  98.1 F (36.7 C) 97.7 F (36.5 C) 97.9 F (36.6 C)  TempSrc:  Oral Oral   SpO2:   96% 94%  Weight: 109.3 kg (240 lb 15.4 oz)     Height:        General: Pt is alert, awake, not in acute distress Cardiovascular: RRR, S1/S2 +, no rubs, no gallops Respiratory: CTA bilaterally, no wheezing, no rhonchi Abdominal: Soft, NT, ND, bowel sounds + Extremities: no edema, no  cyanosis, left toe has dressing applied.  She has a dry gangrene with no discharge.    The results of significant diagnostics from this hospitalization (including imaging, microbiology, ancillary and laboratory) are listed below for reference.     Microbiology: No results found for this or any previous visit (from the past  240 hour(s)).   Labs: BNP (last 3 results) No results for input(s): BNP in the last 8760 hours. Basic Metabolic Panel: Recent Labs  Lab 10/10/17 2120 10/11/17 0424 10/12/17 0449 10/13/17 0325 10/14/17 0345  NA 132* 134* 133* 135 135  K 4.1 4.4 4.3 4.8 4.5  CL 103 104 103 106 107  CO2 14* 14* 14* 14* 13*  GLUCOSE 85 84 81 92 67  BUN 61* 62* 61* 65* 62*  CREATININE 4.18* 4.17* 3.79* 3.50* 3.50*  CALCIUM 7.4* 7.4* 7.3* 7.3* 7.7*  MG  --   --  1.9  --   --   PHOS 6.3* 6.8* 6.1* 6.0*  --    Liver Function Tests: Recent Labs  Lab 10/10/17 2120 10/11/17 0424 10/12/17 0449 10/13/17 0325  ALBUMIN 2.8* 2.7* 2.5* 2.8*   No results for input(s): LIPASE, AMYLASE in the last 168 hours. No results for input(s): AMMONIA in the last 168 hours. CBC: Recent Labs  Lab 10/10/17 2120 10/11/17 0424 10/13/17 0325  WBC 13.0* 12.0* 15.3*  NEUTROABS 10.2*  --   --   HGB 10.0* 9.8* 9.5*  HCT 31.3* 29.9* 29.9*  MCV 86.7 85.9 87.2  PLT 541* 507* 595*   Cardiac Enzymes: Recent Labs  Lab 10/13/17 0325  CKTOTAL 80   BNP: Invalid input(s): POCBNP CBG: No results for input(s): GLUCAP in the last 168 hours. D-Dimer No results for input(s): DDIMER in the last 72 hours. Hgb A1c Recent Labs    10/13/17 0325  HGBA1C 5.5   Lipid Profile No results for input(s): CHOL, HDL, LDLCALC, TRIG, CHOLHDL, LDLDIRECT in the last 72 hours. Thyroid function studies No results for input(s): TSH, T4TOTAL, T3FREE, THYROIDAB in the last 72 hours.  Invalid input(s): FREET3 Anemia work up No results for input(s): VITAMINB12, FOLATE, FERRITIN, TIBC, IRON, RETICCTPCT in the last  72 hours. Urinalysis    Component Value Date/Time   COLORURINE YELLOW 10/10/2017 2335   APPEARANCEUR CLOUDY (A) 10/10/2017 2335   LABSPEC 1.012 10/10/2017 2335   PHURINE 5.0 10/10/2017 2335   GLUCOSEU NEGATIVE 10/10/2017 2335   HGBUR NEGATIVE 10/10/2017 2335   [place] 10/10/2017 [place] 10/10/2017 2335   PROTEINUR NEGATIVE 10/10/2017 2335   NITRITE NEGATIVE 10/10/2017 2335   LEUKOCYTESUR SMALL (A) 10/10/2017 2335   Sepsis Labs Invalid input(s): PROCALCITONIN,  WBC,  LACTICIDVEN Microbiology No results found for this or any previous visit (from the past 240 hour(s)).   Time coordinating discharge:  30 minutes  SIGNED:   Rosita Fire, MD  [facility] 10/14/2017, 11:33 AM  If 7PM-7AM, please contact night-coverage [email or web address] Password [email or web address]

## 2017-10-14 NOTE — Care Management Note (Signed)
Case Management Note  Patient Details  Name: JAIDY COTTAM MRN: [id number] Date of Birth: 02/17/1956  Subjective/Objective:                    Action/Plan:  Spoke to Eustaquio Maize at [place] aware patient being discharged today . Patient will receive her doses of Rocephin and Cubin here at [facility] today before discharge. Prescriptions faxed to [facility] and received . Beth left following instructions to be given to patient:   [facility]   [phone] [address] [place] [address]    Next Steps: Follow up    Instructions: On Saturday October 15, 2017 and Sunday October 16, 2017 go to Emergency Department at 0900 ( both days) for IV antibiotics.  On Monday October 17, 2017 go to Special procedures at 1100 for IV antibiotics and give Beth hard copies of prescriptions.       Patient voiced understanding.  Expected Discharge Date:  10/14/17               Expected Discharge Plan:  Home/Self Care  In-House Referral:     Discharge planning Services     Post Acute Care Choice:  NA Choice offered to:  Patient  DME Arranged:  N/A DME Agency:  NA  HH Arranged:  NA HH Agency:  NA  Status of Service:  Completed, signed off  If discussed at [place] of Stay Meetings, dates discussed:    Additional Comments:  Marilu Favre, RN 10/14/2017, 12:52 PM

## 2017-10-14 NOTE — Progress Notes (Addendum)
[facility] Progress Note    Assessment/ Plan:    1. Acute kidney injury on chronic kidney disease stage IV: from the history, timeline of events and available data base, highly suspicious for hemodynamically mediated acute kidney injury. 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 Complements normal. Hold ARB.  Avoid nephrotoxins including NSAIDs and avoid iodinated intravenous contrast exposure. Limit hypotensive episodes to allow for renal recovery. OK for d/c today from renal perspective.  Renal US with some medicorenal disease.  Baseline cr appears to be 2.4.  Pt has followup with Dr. Justin Mend 11/01/2017 at 10:45 and RCATS ride has been confirmed.  Update: diuretics restarted after CXR shows some vasc congestion (formal read pending).  Lasix 40 mg PO daily.   2. Osteomyelitis second left toe/cellulitis: ongoing intravenous antibiotic therapy with ceftriaxone and daptomycin  3. Hypertension: Hold ARB and furosemide at this time with up titration of meds  4. History of congestive heart failure: no echocardiogram report available but admission note reviewed significant for possible diastolic dysfunction. Appears euvolemic at this time.  5. Diabetes mellitus: Management per primary service.    Subjective:    Feeling OK.  Ready for d/c.  Cr stable off fluids.   Objective:   BP 138/70   Pulse 98   Temp 97.9 F (36.6 C)   Resp 18   Ht 5\' 8"  (1.727 m)   Wt 109.3 kg (240 lb 15.4 oz)   SpO2 94%   BMI 36.64 kg/m   Intake/Output Summary (Last 24 hours) at 10/14/2017 1123 Last data filed at 10/14/2017 0800 Gross per 24 hour  Intake 530 ml  Output -  Net 530 ml   Weight change:   Physical Exam: Gen: NAD, sitting in bed CVS: RRR no m/r/g Resp: clear bilaterally Abd: soft, notender Ext: no LE edema, RUE PICC in place MSK: L foot with L 2nd toe osteomyelitis   Imaging: US Renal  Result Date: 10/13/2017 CLINICAL DATA:  Acute kidney injury. EXAM: RENAL / URINARY TRACT  ULTRASOUND COMPLETE COMPARISON:  None. FINDINGS: Right Kidney: Length: 11.8 cm. Minimally increased echogenicity of renal parenchyma is noted. No mass or hydronephrosis visualized. Left Kidney: Length: 9 cm. Minimally increased echogenicity of renal parenchyma is noted. No mass or hydronephrosis visualized. Bladder: Appears normal for degree of bladder distention. IMPRESSION: Minimally increased echogenicity of renal parenchyma is noted which may represent medical renal disease. No hydronephrosis or renal obstruction is noted. Electronically Signed   By: Marijo Conception, M.D.   On: 10/13/2017 16:13    Labs: BMET Recent Labs  Lab 10/10/17 2120 10/11/17 0424 10/12/17 0449 10/13/17 0325 10/14/17 0345  NA 132* 134* 133* 135 135  K 4.1 4.4 4.3 4.8 4.5  CL 103 104 103 106 107  CO2 14* 14* 14* 14* 13*  GLUCOSE 85 84 81 92 67  BUN 61* 62* 61* 65* 62*  CREATININE 4.18* 4.17* 3.79* 3.50* 3.50*  CALCIUM 7.4* 7.4* 7.3* 7.3* 7.7*  PHOS 6.3* 6.8* 6.1* 6.0*  --    CBC Recent Labs  Lab 10/10/17 2120 10/11/17 0424 10/13/17 0325  WBC 13.0* 12.0* 15.3*  NEUTROABS 10.2*  --   --   HGB 10.0* 9.8* 9.5*  HCT 31.3* 29.9* 29.9*  MCV 86.7 85.9 87.2  PLT 541* 507* 595*    Medications:    . aspirin EC  81 mg Oral Daily  . gabapentin  100 mg Oral Daily  . heparin  5,000 Units Subcutaneous Q8H  . hydrALAZINE  50 mg  Oral BID  . isosorbide mononitrate  60 mg Oral Daily  . labetalol  200 mg Oral BID  . sodium bicarbonate  1,300 mg Oral BID      Madelon Lips, MD [place] pgr [phone] 10/14/2017, 11:23 AM

## 2017-10-14 NOTE — Care Management Important Message (Signed)
Important Message  Patient Details  Name: Kristen Berry MRN: [id number] Date of Birth: May 03, 1956   Medicare Important Message Given:  Yes    Shaletta Hinostroza [address] 10/14/2017, 3:06 PM

## 2017-10-14 NOTE — Progress Notes (Addendum)
Discussed with the patient's daughter Ammie [phone]) in detail.  As per [name], patient is not at her baseline.  She looks tired and difficulty walking.  After the dc order patient was mild shortness of breath especially with ambulation.  The discharge order canceled.  Ordered PT, OT evaluation Chest x-ray ordered especially since we are holding Lasix and patient received IV fluid for renal failure. Continue current antibiotics.  I had a long discussion with the patient's daughter regarding importance of continue IV antibiotics and close follow-up with vascular surgeon and orthopedics.  She understand that patient will follow up with surgeon and likely need of surgical intervention.

## 2017-10-15 ENCOUNTER — Inpatient Hospital Stay (HOSPITAL_COMMUNITY): Payer: Medicare HMO

## 2017-10-15 DIAGNOSIS — R0602 Shortness of breath: Secondary | ICD-10-CM

## 2017-10-15 MED ORDER — LEVALBUTEROL HCL 0.63 MG/3ML IN NEBU
0.6300 mg | INHALATION_SOLUTION | Freq: Once | RESPIRATORY_TRACT | Status: AC
  Administered 2017-10-15: 0.63 mg via RESPIRATORY_TRACT
  Filled 2017-10-15: qty 3

## 2017-10-15 MED ORDER — FUROSEMIDE 10 MG/ML IJ SOLN
40.0000 mg | Freq: Every day | INTRAMUSCULAR | Status: DC
  Administered 2017-10-15: 40 mg via INTRAVENOUS
  Filled 2017-10-15 (×2): qty 4

## 2017-10-15 NOTE — Evaluation (Signed)
Occupational Therapy Evaluation Patient Details Name: RADHIKA DERSHEM MRN: [id number] DOB: 08-12-1956 Today's Date: 10/15/2017    History of Present Illness Patient is a 62 year old female who presented to the hospital with shortness of breath. She was found to have an acute kidney injury. PMH: PVD, HTN, CHF CKD, OA    Clinical Impression   PTA, pt was able to complete ADL and functional mobility with modified independence using Rollator. She completes all IADL, drives, and lives with her daughter. Pt currently presents with decreased activity tolerance for ADL and functional mobility with significant shortness of breath with minimal activity. SpO2 maintained >91% on 2L supplemental O2 via [place] this session despite significant dyspnea. She would benefit from continued OT services while admitted and recommend home health OT follow-up post-acute D/C. Will continue to follow while admitted.    Follow Up Recommendations  Home health OT;Supervision/Assistance - 24 hour(depending on progress)    Equipment Recommendations  None recommended by OT    Recommendations for Other Services       Precautions / Restrictions Precautions Precautions: Fall Restrictions Weight Bearing Restrictions: No      Mobility Bed Mobility Overal bed mobility: Needs Assistance Bed Mobility: Supine to Sit     Supine to sit: Supervision     General bed mobility comments: Supervision for safety to complete bed mobilty to EOB.  Transfers Overall transfer level: Needs assistance Equipment used: Rolling walker (2 wheeled) Transfers: Sit to/from Stand Sit to Stand: Min assist         General transfer comment: Min assist to power up. Requiring momentum, elevated surface, and multiple attempts from EOB    Balance Overall balance assessment: Needs assistance Sitting-balance support: Feet supported;No upper extremity supported Sitting balance-Leahy Scale: Good     Standing balance support: Bilateral upper  extremity supported Standing balance-Leahy Scale: Poor Standing balance comment: Relies on B UE support                           ADL either performed or assessed with clinical judgement   ADL Overall ADL's : Needs assistance/impaired Eating/Feeding: Set up;Sitting   Grooming: Set up;Sitting   Upper Body Bathing: Set up;Sitting   Lower Body Bathing: Sit to/from stand;Minimal assistance   Upper Body Dressing : Set up;Sitting   Lower Body Dressing: Sit to/from stand;Minimal assistance   Toilet Transfer: Ambulation;RW;Minimal assistance Toilet Transfer Details (indicate cue type and reason): Using momentum to rise to standing.  Toileting- Clothing Manipulation and Hygiene: Minimal assistance;Sit to/from stand       Functional mobility during ADLs: Minimal assistance;Rolling walker General ADL Comments: Pt able to progress with mobility once on her feet but with difficulty powering up to standing.      Vision Baseline Vision/History: Wears glasses Wears Glasses: At all times Patient Visual Report: No change from baseline Vision Assessment?: No apparent visual deficits     Perception     Praxis      Pertinent Vitals/Pain       Hand Dominance Right   Extremity/Trunk Assessment Upper Extremity Assessment Upper Extremity Assessment: Overall WFL for tasks assessed   Lower Extremity Assessment Lower Extremity Assessment: Overall WFL for tasks assessed       Communication     Cognition Arousal/Alertness: Awake/alert Behavior During Therapy: WFL for tasks assessed/performed Overall Cognitive Status: Within Functional Limits for tasks assessed  General Comments  Pt on 2L supplemental O2 throughout session today with significant shortness of breath despite SpO2 >91% throughout    Exercises     Shoulder Instructions      Home Living Family/patient expects to be discharged to:: Private  residence Living Arrangements: Children;Other relatives Available Help at Discharge: Family;Available 24 hours/day Type of Home: House Home Access: Stairs to enter [employer] of Steps: 3 Entrance Stairs-Rails: Right;Left;Can reach both Home Layout: One level     Bathroom Shower/Tub: Tub/shower unit;Curtain   Bathroom Toilet: Handicapped height     Home Equipment: Environmental consultant - 4 wheels;Hand held shower head          Prior Functioning/Environment Level of Independence: Independent with assistive device(s)        Comments: Using rollator. Drives and does the grocery shopping and cooking/cleaning        OT Problem List: Decreased activity tolerance;Impaired balance (sitting and/or standing);Decreased safety awareness;Decreased knowledge of use of DME or AE;Cardiopulmonary status limiting activity;Decreased knowledge of precautions      OT Treatment/Interventions:      OT Goals(Current goals can be found in the care plan section) Acute Rehab OT Goals Patient Stated Goal: to breath better and get stronger OT Goal Formulation: With patient Time For Goal Achievement: 10/29/17 Potential to Achieve Goals: Good ADL Goals Pt Will Perform Grooming: with modified independence;standing Pt Will Perform Upper Body Dressing: with modified independence;sitting Pt Will Perform Lower Body Dressing: with modified independence;sit to/from stand Pt Will Transfer to Toilet: with modified independence;ambulating;regular height toilet Pt Will Perform Toileting - Clothing Manipulation and hygiene: with modified independence;sit to/from stand  OT Frequency: Min 2X/week   Barriers to D/C:            Co-evaluation              AM-PAC PT "6 Clicks" Daily Activity     Outcome Measure Help from another person eating meals?: A Little Help from another person taking care of personal grooming?: A Little Help from another person toileting, which includes using toliet, bedpan, or  urinal?: A Little Help from another person bathing (including washing, rinsing, drying)?: A Little Help from another person to put on and taking off regular upper body clothing?: A Little Help from another person to put on and taking off regular lower body clothing?: A Little 6 Click Score: 18   End of Session Equipment Utilized During Treatment: Gait belt;Rolling walker;Oxygen(2L O2) Nurse Communication: Mobility status(shortness of breath)  Activity Tolerance: Patient tolerated treatment well Patient left: in chair;with call bell/phone within reach  OT Visit Diagnosis: Other abnormalities of gait and mobility (R26.89)                Time: 5277-8242 OT Time Calculation (min): 27 min Charges:  OT General Charges $OT Visit: 1 Visit OT Evaluation $OT Eval Moderate Complexity: 1 Mod OT Treatments $Self Care/Home Management : 8-22 mins G-Codes:     Norman Herrlich, MS OTR/L  Pager: [place] A Fraidy Mccarrick 10/15/2017, 2:26 PM

## 2017-10-15 NOTE — Progress Notes (Signed)
[facility] Progress Note    Assessment/ Plan:    1. Acute kidney injury on chronic kidney disease stage IV: from the history, timeline of events and available data base, highly suspicious for hemodynamically mediated acute kidney injury. 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 Complements normal. Hold ARB.  Avoid nephrotoxins including NSAIDs and avoid iodinated intravenous contrast exposure. Limit hypotensive episodes to allow for renal recovery. OK for d/c today from renal perspective.  Renal US with some medicorenal disease.  Baseline cr appears to be 2.4.  Pt has followup with Dr. Justin Mend 11/01/2017 at 10:45 and RCATS ride has been confirmed.  Switch Lasix 40 PO daily to Lasix 40 IV daily.  2. Osteomyelitis second left toe/cellulitis: ongoing intravenous antibiotic therapy with ceftriaxone and daptomycin  3. Hypertension: Hold ARB and furosemide at this time with up titration of meds  4. History of congestive heart failure: no echocardiogram report available but admission note reviewed significant for possible diastolic dysfunction. Appears euvolemic at this time.  5. Diabetes mellitus: Management per primary service.  6.  Dispo: pending improvement in breathing   Subjective:    Still SOB- CXR showing persistence of pulm edema despite restarting PO Lasix.  Will switch to IV.   Objective:   BP (!) 153/73   Pulse (!) 103   Temp 98 F (36.7 C)   Resp 17   Ht 5\' 8"  (1.727 m)   Wt 110.1 kg (242 lb 11.6 oz)   SpO2 94%   BMI 36.91 kg/m   Intake/Output Summary (Last 24 hours) at 10/15/2017 0827 Last data filed at 10/15/2017 0516 Gross per 24 hour  Intake 240 ml  Output 0 ml  Net 240 ml   Weight change: 0.8 kg (1 lb 12.2 oz)  Physical Exam: Gen: sitting in bed, a little more short of breath than yesterday CVS: RRR no m/r/g Resp: slightly increased WOB but lungs don't sound terrible Abd: soft, notender Ext: trace LE edema, RUE PICC in place MSK: L foot with L  2nd toe osteomyelitis   Imaging: US Renal  Result Date: 10/13/2017 CLINICAL DATA:  Acute kidney injury. EXAM: RENAL / URINARY TRACT ULTRASOUND COMPLETE COMPARISON:  None. FINDINGS: Right Kidney: Length: 11.8 cm. Minimally increased echogenicity of renal parenchyma is noted. No mass or hydronephrosis visualized. Left Kidney: Length: 9 cm. Minimally increased echogenicity of renal parenchyma is noted. No mass or hydronephrosis visualized. Bladder: Appears normal for degree of bladder distention. IMPRESSION: Minimally increased echogenicity of renal parenchyma is noted which may represent medical renal disease. No hydronephrosis or renal obstruction is noted. Electronically Signed   By: Marijo Conception, M.D.   On: 10/13/2017 16:13   Dg Chest Port 1 View  Result Date: 10/15/2017 CLINICAL DATA:  62 year old female with increased shortness of breath. EXAM: PORTABLE CHEST 1 VIEW COMPARISON:  Chest radiograph dated 10/14/2017 FINDINGS: Right-sided PICC with tip over central SVC. There is mild cardiomegaly with vascular congestion and edema, worsened compared to the earlier radiograph. Bilateral upper lobe predominant airspace densities concerning for pneumonia, worsened compared to the prior radiograph. Probable small right pleural effusion. No pneumothorax. No acute osseous pathology. IMPRESSION: Cardiomegaly with vascular congestion and edema. Overall interval worsening of the pulmonary congestion and upper lobe predominant opacities compared to prior radiograph. Follow-up recommended. Electronically Signed   By: Anner Crete M.D.   On: 10/15/2017 03:00   Dg Chest Port 1 View  Result Date: 10/14/2017 CLINICAL DATA:  Worsening shortness of breath. EXAM: PORTABLE CHEST 1 VIEW  COMPARISON:  Single-view of the chest 10/10/2017 and 12/13/2014. FINDINGS: Right PICC is unchanged. There is cardiomegaly. There has been worsening of airspace disease in the periphery of the right upper lobe. Patchy bilateral  airspace opacity throughout the remainder of the right lung does not appear markedly changed. There is also left basilar airspace disease. The lungs appear emphysematous. IMPRESSION: Persistent right worse than left airspace disease. Aeration in the right upper lobe has worsened since the prior examination compatible with progressive pneumonia. Electronically Signed   By: Inge Rise M.D.   On: 10/14/2017 14:12    Labs: BMET Recent Labs  Lab 10/10/17 2120 10/11/17 0424 10/12/17 0449 10/13/17 0325 10/14/17 0345  NA 132* 134* 133* 135 135  K 4.1 4.4 4.3 4.8 4.5  CL 103 104 103 106 107  CO2 14* 14* 14* 14* 13*  GLUCOSE 85 84 81 92 67  BUN 61* 62* 61* 65* 62*  CREATININE 4.18* 4.17* 3.79* 3.50* 3.50*  CALCIUM 7.4* 7.4* 7.3* 7.3* 7.7*  PHOS 6.3* 6.8* 6.1* 6.0*  --    CBC Recent Labs  Lab 10/10/17 2120 10/11/17 0424 10/13/17 0325  WBC 13.0* 12.0* 15.3*  NEUTROABS 10.2*  --   --   HGB 10.0* 9.8* 9.5*  HCT 31.3* 29.9* 29.9*  MCV 86.7 85.9 87.2  PLT 541* 507* 595*    Medications:    . aspirin EC  81 mg Oral Daily  . furosemide  40 mg Intravenous Daily  . gabapentin  100 mg Oral Daily  . heparin  5,000 Units Subcutaneous Q8H  . hydrALAZINE  50 mg Oral BID  . isosorbide mononitrate  60 mg Oral Daily  . labetalol  200 mg Oral BID  . sodium bicarbonate  1,300 mg Oral BID      Madelon Lips, MD [place] pgr [phone] 10/15/2017, 8:27 AM

## 2017-10-15 NOTE — Evaluation (Signed)
Physical Therapy Evaluation Patient Details Name: Kristen Berry MRN: [id number] DOB: Oct 08, 1955 Today's Date: 10/15/2017   History of Present Illness  Patient is a 62 year old female who presented to the hospital with shortness of breath. She was found to have an acute kidney injury. PMH: PVD, HTN, CHF CKD, OA   Clinical Impression  Patient was limited by shortness of breath but she was able to ambulate to the door and back with min a. Her SATS remained at 96% on 2l of NCo2. She may benefit from home health to improve endurance depending on how she progresses. Acute therapy will continue to follow.     Follow Up Recommendations Home health PT    Equipment Recommendations       Recommendations for Other Services       Precautions / Restrictions Precautions Precautions: Fall Restrictions Weight Bearing Restrictions: No      Mobility  Bed Mobility               General bed mobility comments: Patient found in a chair but reports she was able to get up with OT without very much help  Transfers Overall transfer level: Needs assistance   Transfers: Sit to/from Stand Sit to Stand: Supervision         General transfer comment: supervision for initial balance. SOB after ambulation but able to sit without assitance.  Ambulation/Gait Ambulation/Gait assistance: Supervision Ambulation Distance (Feet): 15 Feet Assistive device: Rolling walker (2 wheeled) Gait Pattern/deviations: Step-through pattern   Gait velocity interpretation: Below normal speed for age/gender General Gait Details: slow step through pattern. Able to make turns without much cuing.   Stairs            Wheelchair Mobility    Modified Rankin (Stroke Patients Only)       Balance Overall balance assessment: Needs assistance   Sitting balance-Leahy Scale: Good     Standing balance support: Bilateral upper extremity supported Standing balance-Leahy Scale: Poor                                Pertinent Vitals/Pain      Home Living Family/patient expects to be discharged to:: Private residence Living Arrangements: Children;Other relatives Available Help at Discharge: Family;Available 24 hours/day Type of Home: House Home Access: Stairs to enter Entrance Stairs-Rails: Right;Left;Can reach both Entrance Stairs-Number of Steps: 3 Home Layout: One level Home Equipment: Walker - 4 wheels;Hand held shower head      Prior Function Level of Independence: Independent with assistive device(s)         Comments: indepndnet for household distances      Hand Dominance   Dominant Hand: Right    Extremity/Trunk Assessment   Upper Extremity Assessment Upper Extremity Assessment: Defer to OT evaluation    Lower Extremity Assessment Lower Extremity Assessment: Overall WFL for tasks assessed       Communication      Cognition Arousal/Alertness: Awake/alert Behavior During Therapy: WFL for tasks assessed/performed Overall Cognitive Status: Within Functional Limits for tasks assessed                                        General Comments General comments (skin integrity, edema, etc.): 2l of SAO2     Exercises     Assessment/Plan    PT Assessment Patient needs continued PT  services  PT Problem List Decreased strength;Decreased activity tolerance;Decreased balance;Decreased mobility;Decreased safety awareness       PT Treatment Interventions Gait training;Functional mobility training;Therapeutic activities;Therapeutic exercise;Balance training    PT Goals (Current goals can be found in the Care Plan section)  Acute Rehab PT Goals Patient Stated Goal: to breath better  PT Goal Formulation: With patient Time For Goal Achievement: 10/22/17 Potential to Achieve Goals: Good    Frequency Min 3X/week   Barriers to discharge        Co-evaluation               AM-PAC PT "6 Clicks" Daily Activity  Outcome Measure  Difficulty turning over in bed (including adjusting bedclothes, sheets and blankets)?: None Difficulty moving from lying on back to sitting on the side of the bed? : None Difficulty sitting down on and standing up from a chair with arms (e.g., wheelchair, bedside commode, etc,.)?: None Help needed moving to and from a bed to chair (including a wheelchair)?: A Little Help needed walking in hospital room?: A Little Help needed climbing 3-5 steps with a railing? : A Little 6 Click Score: 21    End of Session Equipment Utilized During Treatment: Oxygen Activity Tolerance: Patient limited by fatigue(limited by SOB ) Patient left: in chair;with call bell/phone within reach Nurse Communication: Mobility status PT Visit Diagnosis: Unsteadiness on feet (R26.81);Difficulty in walking, not elsewhere classified (R26.2)    Time: 0110-0125 PT Time Calculation (min) (ACUTE ONLY): 15 min   Charges:   PT Evaluation $PT Eval Moderate Complexity: 1 Mod     PT G Codes:        Carney Living PT DPT  10/15/2017, 1:50 PM

## 2017-10-15 NOTE — Progress Notes (Signed)
CXR report is done. Made NP on call aware.

## 2017-10-15 NOTE — Significant Event (Signed)
Rapid Response Event Note  Overview: Called by RN to assess patient with shortness of breath. Per RN, patient was experiencing shortness of breath, was administered DuoNEB x 1, per RN even though patient stated she felt better after treatment, per RN patient was still labored, wheezing, and RR was into upper 20s-low 30s.   Initial Focused Assessment: Patient was neuro intact and able to follow commands, + wheezing thorough out, and diminished lung sounds throughout. Sats were 88-90% on RA, patient was placed on 2L [place] and sats improved, history of asthma, currently being treated for a pneumonia. Other VS stable and patient denied feeling like she was in distress and denied pain.   Interventions: -- CXR STAT -- Xopenox x 1  Plan of Care (if not transferred): -- I spoke with the [facility] NP on call, she will review the CXR once it is complete -- I was called away to another emergency, RN did informed NP that CXR was completed, per RN, patient is mildly labored with breathing but has  improved overall.   --   I called at 335 and at 525 for updates, no new orders, patient was able to rest.   Event Summary:  End Time Robinson, [place]

## 2017-10-15 NOTE — Progress Notes (Signed)
PROGRESS NOTE    Kristen Berry  DZH:[id number] DOB: 29-Nov-1955 DOA: 10/10/2017 PCP: Martinique, Sarah T, MD   Brief Narrative: 62 year old female with history of peripheral vascular disease, chronic kidney disease stage IV with baseline creatinine around 2.4, recently diagnosed with left toe osteomyelitis at [facility] and discharged with IV daptomycin and ceftriaxone. Patient was receiving IV antibiotics daily at [facility] before transfer here. As per prior note, it was discussed with the surgery and no surgical procedure was recommended due to peripheral vascular disease. The plan was to follow-up with vascular and orthopedics as outpatient.  Patient was transferred to [facility] for worsening renal failure and for nephrology consult.  Assessment & Plan:  #Acute kidney injury on CKD stage IV: Likely hemodynamically mediated. UA unremarkable. Complements normal. Serum creatinine level 3.5 yesterday.  Pending today's lab.  Renal ultrasound consistent with chronic kidney disease.  Patient with shortness of breath and acute pulmonary edema therefore resuming Lasix.  Nephrology consult appreciated.  Monitor labs closely.  #Shortness of breath/acute pulmonary edema: Chest x-ray with cardiomegaly with vascular congestion and edema.  Patient received IV fluid on admission because of renal failure.  Now check echocardiogram.  Monitor starting IV Lasix.  Renal function, electrolytes and urine output.  Using about 2 L of oxygen.  #Left second toe osteomyelitis: As per prior progress note, she will follow-up with orthopedics outpatient and plan for vascular procedures. She was recently discharged from [facility] with IV daptomycin and ceftriaxone. As per patient she was getting IV antibiotics daily at the hospital and the plan is to continue until November 11, 2017. I have discussed with the case manager.The [facility] is requesting the prescription for antibiotics  to resume after discharge. I consulted pharmacist.  The resumption order for antibiotics prescribed on discharge.  I have discussed with the patient at bedside in detail regarding importance of following up with infectious disease vascular and orthopedics.  She verbalized understanding.  #Type 2 diabetes: Continue to monitor blood sugar level. A1c 5.5.  #Hypertension: Monitor blood pressure.  On Lasix.  Losartan on hold.  Continue Imdur, labetalol and hydralazine.  #History of CHF as per prior note, type unspecified: No echo result available to review. Follow-up echo.  Continue current cardiac medication.  #Physical deconditioning: PT OT evaluation.  Discussed with the patient daughter at length yesterday.  DVT prophylaxis: Heparin subcutaneous Code Status: Full code Family Communication: Discussed with the patient daughter over the phone yesterday Disposition Plan: Likely discharge home with home care services in 1-2 days    Consultants:   Nephrology  Procedures: None Antimicrobials: On daptomycin and IV ceftriaxone since before admission  Subjective: Seen and examined at bedside.  Overnight event noted.  Shortness of breath is better now.  Mild cough.  No chest pain.  No nausea or vomiting.  Objective: Vitals:   10/15/17 0822 10/15/17 0830 10/15/17 1032 10/15/17 1406  BP: (!) 153/73 (!) 153/73 (!) 157/66 124/78  Pulse:  (!) 104 (!) 102 100  Resp:  20  20  Temp:  98.2 F (36.8 C)  97.9 F (36.6 C)  TempSrc:  Axillary  Oral  SpO2:  97%  96%  Weight:      Height:        Intake/Output Summary (Last 24 hours) at 10/15/2017 1428 Last data filed at 10/15/2017 0516 Gross per 24 hour  Intake 240 ml  Output 0 ml  Net 240 ml   Filed Weights   10/12/17 0432 10/13/17 1219  10/14/17 1300  Weight: 110.6 kg (243 lb 13.3 oz) 109.3 kg (240 lb 15.4 oz) 110.1 kg (242 lb 11.6 oz)    Examination:  General exam: Appears calm and comfortable  Respiratory system: Bibasilar  crackles, respiratory for normal. Cardiovascular system: S1 & S2 heard, RRR.  No pedal edema. Gastrointestinal system: Abdomen is nondistended, soft and nontender. Normal bowel sounds heard. Central nervous system: Alert and oriented. No focal neurological deficits. Extremities: Trace edema lower extremity, dressing applied in left toe. Skin: No rashes, lesions or ulcers Psychiatry: Judgement and insight appear normal. Mood & affect appropriate.     Data Reviewed: I have personally reviewed following labs and imaging studies  CBC: Recent Labs  Lab 10/10/17 2120 10/11/17 0424 10/13/17 0325  WBC 13.0* 12.0* 15.3*  NEUTROABS 10.2*  --   --   HGB 10.0* 9.8* 9.5*  HCT 31.3* 29.9* 29.9*  MCV 86.7 85.9 87.2  PLT 541* 507* 419*   Basic Metabolic Panel: Recent Labs  Lab 10/10/17 2120 10/11/17 0424 10/12/17 0449 10/13/17 0325 10/14/17 0345  NA 132* 134* 133* 135 135  K 4.1 4.4 4.3 4.8 4.5  CL 103 104 103 106 107  CO2 14* 14* 14* 14* 13*  GLUCOSE 85 84 81 92 67  BUN 61* 62* 61* 65* 62*  CREATININE 4.18* 4.17* 3.79* 3.50* 3.50*  CALCIUM 7.4* 7.4* 7.3* 7.3* 7.7*  MG  --   --  1.9  --   --   PHOS 6.3* 6.8* 6.1* 6.0*  --    GFR: Estimated Creatinine Clearance: 21.7 mL/min (A) (by C-G formula based on SCr of 3.5 mg/dL (H)). Liver Function Tests: Recent Labs  Lab 10/10/17 2120 10/11/17 0424 10/12/17 0449 10/13/17 0325  ALBUMIN 2.8* 2.7* 2.5* 2.8*   No results for input(s): LIPASE, AMYLASE in the last 168 hours. No results for input(s): AMMONIA in the last 168 hours. Coagulation Profile: No results for input(s): INR, PROTIME in the last 168 hours. Cardiac Enzymes: Recent Labs  Lab 10/13/17 0325  CKTOTAL 80   BNP (last 3 results) No results for input(s): PROBNP in the last 8760 hours. HbA1C: Recent Labs    10/13/17 0325  HGBA1C 5.5   CBG: No results for input(s): GLUCAP in the last 168 hours. Lipid Profile: No results for input(s): CHOL, HDL, LDLCALC, TRIG,  CHOLHDL, LDLDIRECT in the last 72 hours. Thyroid Function Tests: No results for input(s): TSH, T4TOTAL, FREET4, T3FREE, THYROIDAB in the last 72 hours. Anemia Panel: No results for input(s): VITAMINB12, FOLATE, FERRITIN, TIBC, IRON, RETICCTPCT in the last 72 hours. Sepsis Labs: No results for input(s): PROCALCITON, LATICACIDVEN in the last 168 hours.  No results found for this or any previous visit (from the past 240 hour(s)).       Radiology Studies: US Renal  Result Date: 10/13/2017 CLINICAL DATA:  Acute kidney injury. EXAM: RENAL / URINARY TRACT ULTRASOUND COMPLETE COMPARISON:  None. FINDINGS: Right Kidney: Length: 11.8 cm. Minimally increased echogenicity of renal parenchyma is noted. No mass or hydronephrosis visualized. Left Kidney: Length: 9 cm. Minimally increased echogenicity of renal parenchyma is noted. No mass or hydronephrosis visualized. Bladder: Appears normal for degree of bladder distention. IMPRESSION: Minimally increased echogenicity of renal parenchyma is noted which may represent medical renal disease. No hydronephrosis or renal obstruction is noted. Electronically Signed   By: Marijo Conception, M.D.   On: 10/13/2017 16:13   Dg Chest Port 1 View  Result Date: 10/15/2017 CLINICAL DATA:  62 year old female with increased shortness of  breath. EXAM: PORTABLE CHEST 1 VIEW COMPARISON:  Chest radiograph dated 10/14/2017 FINDINGS: Right-sided PICC with tip over central SVC. There is mild cardiomegaly with vascular congestion and edema, worsened compared to the earlier radiograph. Bilateral upper lobe predominant airspace densities concerning for pneumonia, worsened compared to the prior radiograph. Probable small right pleural effusion. No pneumothorax. No acute osseous pathology. IMPRESSION: Cardiomegaly with vascular congestion and edema. Overall interval worsening of the pulmonary congestion and upper lobe predominant opacities compared to prior radiograph. Follow-up recommended.  Electronically Signed   By: Anner Crete M.D.   On: 10/15/2017 03:00   Dg Chest Port 1 View  Result Date: 10/14/2017 CLINICAL DATA:  Worsening shortness of breath. EXAM: PORTABLE CHEST 1 VIEW COMPARISON:  Single-view of the chest 10/10/2017 and 12/13/2014. FINDINGS: Right PICC is unchanged. There is cardiomegaly. There has been worsening of airspace disease in the periphery of the right upper lobe. Patchy bilateral airspace opacity throughout the remainder of the right lung does not appear markedly changed. There is also left basilar airspace disease. The lungs appear emphysematous. IMPRESSION: Persistent right worse than left airspace disease. Aeration in the right upper lobe has worsened since the prior examination compatible with progressive pneumonia. Electronically Signed   By: Inge Rise M.D.   On: 10/14/2017 14:12        Scheduled Meds: . aspirin EC  81 mg Oral Daily  . furosemide  40 mg Intravenous Daily  . gabapentin  100 mg Oral Daily  . heparin  5,000 Units Subcutaneous Q8H  . hydrALAZINE  50 mg Oral BID  . isosorbide mononitrate  60 mg Oral Daily  . labetalol  200 mg Oral BID  . sodium bicarbonate  1,300 mg Oral BID   Continuous Infusions: . cefTRIAXone (ROCEPHIN)  IV Stopped (10/14/17 1434)  . DAPTOmycin (CUBICIN)  IV Stopped (10/14/17 1511)     LOS: 5 days    Dron Tanna Furry, MD [facility] Pager [phone]  If 7PM-7AM, please contact night-coverage [email or web address] Password [email or web address] 10/15/2017, 2:28 PM

## 2017-10-15 NOTE — Progress Notes (Addendum)
Pt c/o of SOB. Lung sounds bilateral wheezing and diminshed on lower lobes. PRN albuterol given with minimal relief. VS as follows: T=98.1 orally, BP=148/77, HR=108, RR=30bpm O2=96 on RA. RRRN consulted.

## 2017-10-15 NOTE — Progress Notes (Signed)
Xopenex given. Pt verbalized relief VS as follows; BP=158/72, HR=107, RR=28bpm, O2= 97 on 2L via [place].

## 2017-10-16 LAB — BASIC METABOLIC PANEL
Anion gap: 15 (ref 5–15)
BUN: 67 mg/dL — ABNORMAL HIGH (ref 6–20)
CO2: 15 mmol/L — ABNORMAL LOW (ref 22–32)
Calcium: 8.3 mg/dL — ABNORMAL LOW (ref 8.9–10.3)
Chloride: 105 mmol/L (ref 101–111)
Creatinine, Ser: 3.65 mg/dL — ABNORMAL HIGH (ref 0.44–1.00)
GFR calc Af Amer: 14 mL/min — ABNORMAL LOW (ref >60)
GFR calc non Af Amer: 12 mL/min — ABNORMAL LOW (ref >60)
Glucose, Bld: 100 mg/dL — ABNORMAL HIGH (ref 65–99)
Potassium: 4.6 mmol/L (ref 3.5–5.1)
Sodium: 135 mmol/L (ref 135–145)

## 2017-10-16 MED ORDER — FUROSEMIDE 10 MG/ML IJ SOLN
40.0000 mg | Freq: Two times a day (BID) | INTRAMUSCULAR | Status: DC
  Administered 2017-10-16 – 2017-10-17 (×3): 40 mg via INTRAVENOUS
  Filled 2017-10-16 (×2): qty 4

## 2017-10-16 MED ORDER — METHYLPREDNISOLONE SODIUM SUCC 125 MG IJ SOLR
80.0000 mg | Freq: Two times a day (BID) | INTRAMUSCULAR | Status: DC
  Administered 2017-10-16 – 2017-10-17 (×3): 80 mg via INTRAVENOUS
  Filled 2017-10-16 (×3): qty 2

## 2017-10-16 NOTE — Progress Notes (Signed)
PROGRESS NOTE    Kristen Berry  FUX:[id number] DOB: [birth year] DOA: 10/10/2017 PCP: Martinique, Sarah T, MD   Brief Narrative: 63 year old female with history of peripheral vascular disease, chronic kidney disease stage IV with baseline creatinine around 2.4, recently diagnosed with left toe osteomyelitis at [facility] and discharged with IV daptomycin and ceftriaxone. Patient was receiving IV antibiotics daily at [facility] before transfer here. As per prior note, it was discussed with the surgery and no surgical procedure was recommended due to peripheral vascular disease. The plan was to follow-up with vascular and orthopedics as outpatient.  Patient was transferred to [facility] for worsening renal failure and for nephrology consult.  Assessment & Plan:  #Acute kidney injury on CKD stage IV: Likely hemodynamically mediated. UA unremarkable. Complements normal. Renal ultrasound consistent with chronic kidney disease.  Patient with shortness of breath and acute pulmonary edema therefore on IV Lasix.  Monitor BMP.  Serum creatinine level 3.65 today.  #Shortness of breath/acute pulmonary edema: Chest x-ray with cardiomegaly with vascular congestion and edema.  Patient received IV fluid on admission because of renal failure.   -Still short of breath and wheezing.  Increasing IV Lasix to twice a day.  Follow-up echocardiogram.  Monitor BMP and urine output.  #History of mild intermittent asthma: Patient with bilateral diffuse wheeze.  Starting IV Solu-Medrol and continue bronchodilators if she is on asthma exacerbation.  The wheezing is audible.  Currently on about 2 L of oxygen.  #Left second toe osteomyelitis: As per prior progress note, she will follow-up with orthopedics outpatient and plan for vascular procedures. She was recently discharged from [facility] with IV daptomycin and ceftriaxone. As per patient she was getting IV antibiotics daily at the hospital  and the plan is to continue until November 11, 2017. I have discussed with the case manager.The [facility] is requesting the prescription for antibiotics to resume after discharge. I consulted pharmacist.  The resumption order for antibiotics prescribed on discharge.  I have discussed with the patient at bedside in detail regarding importance of following up with infectious disease vascular and orthopedics.  She verbalized understanding.  #Type 2 diabetes: Continue to monitor blood sugar level. A1c 5.5.  #Hypertension: Monitor blood pressure.  On Lasix.  Losartan on hold.  Continue Imdur, labetalol and hydralazine.  #History of CHF as per prior note, type unspecified: No echo result available to review. Follow-up echo.  Continue current cardiac medication.  #Physical deconditioning: PT OT evaluation.   DVT prophylaxis: Heparin subcutaneous Code Status: Full code Family Communication: No family at bedside. Disposition Plan: Likely discharge home with home care services in 1-2 days    Consultants:   Nephrology  Procedures: None Antimicrobials: On daptomycin and IV ceftriaxone since before admission  Subjective: Seen and examined at bedside.  Still having cough, shortness of breath and wheezing.  Not at baseline.  No chest pain.  No nausea or vomiting.  Objective: Vitals:   10/15/17 2111 10/15/17 2204 10/16/17 0522 10/16/17 0851  BP:  (!) 152/64 (!) 159/59 (!) 158/83  Pulse:  70 75 98  Resp:  19 18 20   Temp:  97.7 F (36.5 C) 97.6 F (36.4 C) 98.2 F (36.8 C)  TempSrc:  Oral Oral Axillary  SpO2: 96% 96% 96% 95%  Weight:      Height:        Intake/Output Summary (Last 24 hours) at 10/16/2017 1059 Last data filed at 10/16/2017 0900 Gross per 24 hour  Intake 150 ml  Output 1000 ml  Net -850 ml   Filed Weights   10/12/17 0432 10/13/17 1219 10/14/17 1300  Weight: 110.6 kg (243 lb 13.3 oz) 109.3 kg (240 lb 15.4 oz) 110.1 kg (242 lb 11.6 oz)     Examination:  General exam: In bed comfortable, not in distress Respiratory system: Bilateral diffuse expiratory wheeze, basal rhonchi.  No increased work of breathing. Cardiovascular system: Regular rate rhythm S1-S2 normal.  No pedal edema.. Gastrointestinal system: Abdomen is nondistended, soft and nontender. Normal bowel sounds heard. Central nervous system: Alert and oriented. No focal neurological deficits. Extremities: Trace edema lower extremity, dressing applied in left toe. Skin: No rashes, lesions or ulcers Psychiatry: Judgement and insight appear normal. Mood & affect appropriate.     Data Reviewed: I have personally reviewed following labs and imaging studies  CBC: Recent Labs  Lab 10/10/17 2120 10/11/17 0424 10/13/17 0325  WBC 13.0* 12.0* 15.3*  NEUTROABS 10.2*  --   --   HGB 10.0* 9.8* 9.5*  HCT 31.3* 29.9* 29.9*  MCV 86.7 85.9 87.2  PLT 541* 507* 924*   Basic Metabolic Panel: Recent Labs  Lab 10/10/17 2120 10/11/17 0424 10/12/17 0449 10/13/17 0325 10/14/17 0345 10/16/17 0511  NA 132* 134* 133* 135 135 135  K 4.1 4.4 4.3 4.8 4.5 4.6  CL 103 104 103 106 107 105  CO2 14* 14* 14* 14* 13* 15*  GLUCOSE 85 84 81 92 67 100*  BUN 61* 62* 61* 65* 62* 67*  CREATININE 4.18* 4.17* 3.79* 3.50* 3.50* 3.65*  CALCIUM 7.4* 7.4* 7.3* 7.3* 7.7* 8.3*  MG  --   --  1.9  --   --   --   PHOS 6.3* 6.8* 6.1* 6.0*  --   --    GFR: Estimated Creatinine Clearance: 20.8 mL/min (A) (by C-G formula based on SCr of 3.65 mg/dL (H)). Liver Function Tests: Recent Labs  Lab 10/10/17 2120 10/11/17 0424 10/12/17 0449 10/13/17 0325  ALBUMIN 2.8* 2.7* 2.5* 2.8*   No results for input(s): LIPASE, AMYLASE in the last 168 hours. No results for input(s): AMMONIA in the last 168 hours. Coagulation Profile: No results for input(s): INR, PROTIME in the last 168 hours. Cardiac Enzymes: Recent Labs  Lab 10/13/17 0325  CKTOTAL 80   BNP (last 3 results) No results for  input(s): PROBNP in the last 8760 hours. HbA1C: No results for input(s): HGBA1C in the last 72 hours. CBG: No results for input(s): GLUCAP in the last 168 hours. Lipid Profile: No results for input(s): CHOL, HDL, LDLCALC, TRIG, CHOLHDL, LDLDIRECT in the last 72 hours. Thyroid Function Tests: No results for input(s): TSH, T4TOTAL, FREET4, T3FREE, THYROIDAB in the last 72 hours. Anemia Panel: No results for input(s): VITAMINB12, FOLATE, FERRITIN, TIBC, IRON, RETICCTPCT in the last 72 hours. Sepsis Labs: No results for input(s): PROCALCITON, LATICACIDVEN in the last 168 hours.  No results found for this or any previous visit (from the past 240 hour(s)).       Radiology Studies: Dg Chest Port 1 View  Result Date: 10/15/2017 CLINICAL DATA:  62 year old female with increased shortness of breath. EXAM: PORTABLE CHEST 1 VIEW COMPARISON:  Chest radiograph dated 10/14/2017 FINDINGS: Right-sided PICC with tip over central SVC. There is mild cardiomegaly with vascular congestion and edema, worsened compared to the earlier radiograph. Bilateral upper lobe predominant airspace densities concerning for pneumonia, worsened compared to the prior radiograph. Probable small right pleural effusion. No pneumothorax. No acute osseous pathology. IMPRESSION: Cardiomegaly with vascular congestion and  edema. Overall interval worsening of the pulmonary congestion and upper lobe predominant opacities compared to prior radiograph. Follow-up recommended. Electronically Signed   By: Anner Crete M.D.   On: 10/15/2017 03:00   Dg Chest Port 1 View  Result Date: 10/14/2017 CLINICAL DATA:  Worsening shortness of breath. EXAM: PORTABLE CHEST 1 VIEW COMPARISON:  Single-view of the chest 10/10/2017 and 12/13/2014. FINDINGS: Right PICC is unchanged. There is cardiomegaly. There has been worsening of airspace disease in the periphery of the right upper lobe. Patchy bilateral airspace opacity throughout the remainder of the  right lung does not appear markedly changed. There is also left basilar airspace disease. The lungs appear emphysematous. IMPRESSION: Persistent right worse than left airspace disease. Aeration in the right upper lobe has worsened since the prior examination compatible with progressive pneumonia. Electronically Signed   By: Inge Rise M.D.   On: 10/14/2017 14:12        Scheduled Meds: . aspirin EC  81 mg Oral Daily  . furosemide  40 mg Intravenous BID  . gabapentin  100 mg Oral Daily  . heparin  5,000 Units Subcutaneous Q8H  . hydrALAZINE  50 mg Oral BID  . isosorbide mononitrate  60 mg Oral Daily  . labetalol  200 mg Oral BID  . methylPREDNISolone (SOLU-MEDROL) injection  80 mg Intravenous Q12H  . sodium bicarbonate  1,300 mg Oral BID   Continuous Infusions: . cefTRIAXone (ROCEPHIN)  IV Stopped (10/15/17 1530)  . DAPTOmycin (CUBICIN)  IV Stopped (10/14/17 1511)     LOS: 6 days    Mate Alegria Tanna Furry, MD [facility] Pager [phone]  If 7PM-7AM, please contact night-coverage [email or web address] Password [email or web address] 10/16/2017, 10:59 AM

## 2017-10-16 NOTE — Progress Notes (Signed)
[facility] Progress Note    Assessment/ Plan:    1. Acute kidney injury on chronic kidney disease stage IV: from the history, timeline of events and available data base, highly suspicious for hemodynamically mediated acute kidney injury. 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 Complements normal. Hold ARB.  Avoid nephrotoxins including NSAIDs and avoid iodinated intravenous contrast exposure. Limit hypotensive episodes to allow for renal recovery. OK for d/c today from renal perspective.  Renal US with some medicorenal disease.  Baseline cr appears to be 2.4.  Pt has followup with Dr. Justin Mend 11/01/2017 at 10:45. Increase Lasix to 40 IV BID.  2.  Acute diastolic CHF: Takes Lasix 40 mg PO at home.  Switching as above.  Holding Losartan.  2. Osteomyelitis second left toe/cellulitis: ongoing intravenous antibiotic therapy with ceftriaxone and daptomycin  3. Hypertension: As above in #1 and 2  5. Diabetes mellitus: Management per primary service.  6.  Dispo: pending improvement in breathing   Subjective:    Still SOB despite IV Lasix switch.  Cr 3.5-->3.6.    Objective:   BP (!) 158/83 (BP Location: Left Arm)   Pulse 98   Temp 98.2 F (36.8 C) (Axillary)   Resp 20   Ht 5\' 8"  (1.727 m)   Wt 110.1 kg (242 lb 11.6 oz)   SpO2 95%   BMI 36.91 kg/m   Intake/Output Summary (Last 24 hours) at 10/16/2017 0941 Last data filed at 10/16/2017 0900 Gross per 24 hour  Intake 150 ml  Output 1000 ml  Net -850 ml   Weight change:   Physical Exam: Gen: sitting in bed, still a little SOB HEENT: +JVD CVS: RRR no m/r/g Resp: slightly increased WOB but lungs don't sound as coarse as yesterday Abd: soft, notender Ext: trace LE edema, RUE PICC in place MSK: L foot with L 2nd toe osteomyelitis   Imaging: Dg Chest Port 1 View  Result Date: 10/15/2017 CLINICAL DATA:  62 year old female with increased shortness of breath. EXAM: PORTABLE CHEST 1 VIEW COMPARISON:  Chest radiograph  dated 10/14/2017 FINDINGS: Right-sided PICC with tip over central SVC. There is mild cardiomegaly with vascular congestion and edema, worsened compared to the earlier radiograph. Bilateral upper lobe predominant airspace densities concerning for pneumonia, worsened compared to the prior radiograph. Probable small right pleural effusion. No pneumothorax. No acute osseous pathology. IMPRESSION: Cardiomegaly with vascular congestion and edema. Overall interval worsening of the pulmonary congestion and upper lobe predominant opacities compared to prior radiograph. Follow-up recommended. Electronically Signed   By: Anner Crete M.D.   On: 10/15/2017 03:00   Dg Chest Port 1 View  Result Date: 10/14/2017 CLINICAL DATA:  Worsening shortness of breath. EXAM: PORTABLE CHEST 1 VIEW COMPARISON:  Single-view of the chest 10/10/2017 and 12/13/2014. FINDINGS: Right PICC is unchanged. There is cardiomegaly. There has been worsening of airspace disease in the periphery of the right upper lobe. Patchy bilateral airspace opacity throughout the remainder of the right lung does not appear markedly changed. There is also left basilar airspace disease. The lungs appear emphysematous. IMPRESSION: Persistent right worse than left airspace disease. Aeration in the right upper lobe has worsened since the prior examination compatible with progressive pneumonia. Electronically Signed   By: Inge Rise M.D.   On: 10/14/2017 14:12    Labs: BMET Recent Labs  Lab 10/10/17 2120 10/11/17 0424 10/12/17 0449 10/13/17 0325 10/14/17 0345 10/16/17 0511  NA 132* 134* 133* 135 135 135  K 4.1 4.4 4.3 4.8 4.5 4.6  CL 103 104 103 106 107 105  CO2 14* 14* 14* 14* 13* 15*  GLUCOSE 85 84 81 92 67 100*  BUN 61* 62* 61* 65* 62* 67*  CREATININE 4.18* 4.17* 3.79* 3.50* 3.50* 3.65*  CALCIUM 7.4* 7.4* 7.3* 7.3* 7.7* 8.3*  PHOS 6.3* 6.8* 6.1* 6.0*  --   --    CBC Recent Labs  Lab 10/10/17 2120 10/11/17 0424 10/13/17 0325  WBC  13.0* 12.0* 15.3*  NEUTROABS 10.2*  --   --   HGB 10.0* 9.8* 9.5*  HCT 31.3* 29.9* 29.9*  MCV 86.7 85.9 87.2  PLT 541* 507* 595*    Medications:    . aspirin EC  81 mg Oral Daily  . furosemide  40 mg Intravenous BID  . gabapentin  100 mg Oral Daily  . heparin  5,000 Units Subcutaneous Q8H  . hydrALAZINE  50 mg Oral BID  . isosorbide mononitrate  60 mg Oral Daily  . labetalol  200 mg Oral BID  . sodium bicarbonate  1,300 mg Oral BID      Madelon Lips, MD [facility] pgr [phone] 10/16/2017, 9:41 AM

## 2017-10-17 ENCOUNTER — Inpatient Hospital Stay (HOSPITAL_COMMUNITY): Payer: Medicare HMO

## 2017-10-17 ENCOUNTER — Encounter (HOSPITAL_COMMUNITY): Payer: Self-pay | Admitting: Cardiology

## 2017-10-17 DIAGNOSIS — N189 Chronic kidney disease, unspecified: Secondary | ICD-10-CM

## 2017-10-17 DIAGNOSIS — I10 Essential (primary) hypertension: Secondary | ICD-10-CM

## 2017-10-17 DIAGNOSIS — I5043 Acute on chronic combined systolic (congestive) and diastolic (congestive) heart failure: Secondary | ICD-10-CM

## 2017-10-17 DIAGNOSIS — N184 Chronic kidney disease, stage 4 (severe): Secondary | ICD-10-CM

## 2017-10-17 DIAGNOSIS — R0600 Dyspnea, unspecified: Secondary | ICD-10-CM

## 2017-10-17 DIAGNOSIS — N179 Acute kidney failure, unspecified: Principal | ICD-10-CM

## 2017-10-17 DIAGNOSIS — I2510 Atherosclerotic heart disease of native coronary artery without angina pectoris: Secondary | ICD-10-CM

## 2017-10-17 LAB — URINALYSIS, ROUTINE W REFLEX MICROSCOPIC
Bilirubin Urine: NEGATIVE
Glucose, UA: NEGATIVE mg/dL
Hgb urine dipstick: NEGATIVE
Ketones, ur: 5 mg/dL — AB
Leukocytes, UA: NEGATIVE
Nitrite: NEGATIVE
Protein, ur: NEGATIVE mg/dL
Specific Gravity, Urine: 1.013 (ref 1.005–1.030)
pH: 5 (ref 5.0–8.0)

## 2017-10-17 LAB — BASIC METABOLIC PANEL
Anion gap: 16 — ABNORMAL HIGH (ref 5–15)
BUN: 83 mg/dL — ABNORMAL HIGH (ref 6–20)
CO2: 15 mmol/L — ABNORMAL LOW (ref 22–32)
Calcium: 8.7 mg/dL — ABNORMAL LOW (ref 8.9–10.3)
Chloride: 104 mmol/L (ref 101–111)
Creatinine, Ser: 3.8 mg/dL — ABNORMAL HIGH (ref 0.44–1.00)
GFR calc Af Amer: 14 mL/min — ABNORMAL LOW (ref >60)
GFR calc non Af Amer: 12 mL/min — ABNORMAL LOW (ref >60)
Glucose, Bld: 152 mg/dL — ABNORMAL HIGH (ref 65–99)
Potassium: 5.4 mmol/L — ABNORMAL HIGH (ref 3.5–5.1)
Sodium: 135 mmol/L (ref 135–145)

## 2017-10-17 LAB — EOSINOPHIL COUNT: Eosinophils Absolute: 0 10*3/uL (ref 0.0–0.7)

## 2017-10-17 LAB — ECHOCARDIOGRAM COMPLETE
Height: 68 in
Weight: 3908.31 oz

## 2017-10-17 LAB — CK: Total CK: 109 U/L (ref 38–234)

## 2017-10-17 LAB — BRAIN NATRIURETIC PEPTIDE: B Natriuretic Peptide: 2204.7 pg/mL — ABNORMAL HIGH (ref 0.0–100.0)

## 2017-10-17 MED ORDER — PREDNISONE 20 MG PO TABS: 40.0000 mg | ORAL_TABLET | Freq: Every day | ORAL | Status: DC

## 2017-10-17 MED ORDER — FUROSEMIDE 10 MG/ML IJ SOLN: 80.0000 mg | Freq: Two times a day (BID) | INTRAMUSCULAR | Status: DC

## 2017-10-17 MED ORDER — FUROSEMIDE 10 MG/ML IJ SOLN
80.0000 mg | Freq: Once | INTRAMUSCULAR | Status: AC
  Administered 2017-10-17: 80 mg via INTRAVENOUS
  Filled 2017-10-17: qty 8

## 2017-10-17 MED ORDER — DARBEPOETIN ALFA 60 MCG/0.3ML IJ SOSY
60.0000 ug | PREFILLED_SYRINGE | Freq: Once | INTRAMUSCULAR | Status: AC
  Administered 2017-10-17: 60 ug via SUBCUTANEOUS
  Filled 2017-10-17 (×2): qty 0.3

## 2017-10-17 MED ORDER — ISOSORBIDE MONONITRATE ER 60 MG PO TB24
120.0000 mg | ORAL_TABLET | Freq: Every day | ORAL | Status: DC
  Administered 2017-10-18 – 2017-11-13 (×25): 120 mg via ORAL
  Filled 2017-10-17 (×27): qty 2

## 2017-10-17 MED ORDER — PERFLUTREN LIPID MICROSPHERE
1.0000 mL | INTRAVENOUS | Status: AC | PRN
Start: 2017-10-17 — End: 2017-10-17
  Administered 2017-10-17: 2 mL via INTRAVENOUS
  Filled 2017-10-17: qty 10

## 2017-10-17 NOTE — Progress Notes (Signed)
Physical Therapy Treatment Patient Details Name: Kristen Berry MRN: [id number] DOB: 12/17/1955 Today's Date: 10/17/2017    History of Present Illness Patient is a 62 year old female who presented to the hospital with shortness of breath. She was found to have an acute kidney injury. PMH: PVD, HTN, CHF CKD, OA     PT Comments    Pt progressing towards physical therapy goals. Was able to perform transfers and ambulation with gross supervision for safety. Pt limited overall by DOE. On RA, pt maintained sats ~95% with sit<>stand transfers, however was very dyspneic and requested supplemental O2 for gait training. During ambulation pt remains dyspneic however sats maintained between 96-98% on 2L/min supplemental O2. Will continue to follow and progress as able per POC.    Follow Up Recommendations  Home health PT     Equipment Recommendations  Rolling walker with 5" wheels    Recommendations for Other Services       Precautions / Restrictions Precautions Precautions: Fall Restrictions Weight Bearing Restrictions: No    Mobility  Bed Mobility Overal bed mobility: Needs Assistance Bed Mobility: Supine to Sit;Sit to Supine     Supine to sit: Modified independent (Device/Increase time) Sit to supine: Supervision   General bed mobility comments: No assist required for bed mobility, however supervision provided for LE elevation up into bed (appeared more effortful).   Transfers Overall transfer level: Needs assistance Equipment used: Rolling walker (2 wheeled) Transfers: Sit to/from Stand Sit to Stand: Supervision         General transfer comment: Close supervision for safety. Pt was able to power-up to full stand without assistance and without difficulty.   Ambulation/Gait Ambulation/Gait assistance: Supervision;Min guard Ambulation Distance (Feet): 75 Feet Assistive device: Rolling walker (2 wheeled) Gait Pattern/deviations: Step-through pattern Gait velocity:  Decreased Gait velocity interpretation: Below normal speed for age/gender General Gait Details: Slow and guarded with almost immediate DOE. Pt ambulated on 2L/min supplemental O2 and sats remained 96-98% throughout gait training. Pt required several standing rest breaks in which she was resting her forearms on the walker for support and to catch her breath. Min guard provided during these times of rest however the rest of gait training was at a supervision level.    Stairs            Wheelchair Mobility    Modified Rankin (Stroke Patients Only)       Balance Overall balance assessment: Needs assistance Sitting-balance support: Feet supported;No upper extremity supported Sitting balance-Leahy Scale: Good     Standing balance support: Bilateral upper extremity supported Standing balance-Leahy Scale: Poor Standing balance comment: Relies on B UE support                            Cognition Arousal/Alertness: Awake/alert Behavior During Therapy: WFL for tasks assessed/performed Overall Cognitive Status: Within Functional Limits for tasks assessed                                        Exercises      General Comments        Pertinent Vitals/Pain Pain Assessment: No/denies pain    Home Living                      Prior Function            PT  Goals (current goals can now be found in the care plan section) Acute Rehab PT Goals Patient Stated Goal: to breathe better and get stronger PT Goal Formulation: With patient Time For Goal Achievement: 10/22/17 Potential to Achieve Goals: Good Progress towards PT goals: Progressing toward goals    Frequency    Min 3X/week      PT Plan Current plan remains appropriate    Co-evaluation              AM-PAC PT "6 Clicks" Daily Activity  Outcome Measure  Difficulty turning over in bed (including adjusting bedclothes, sheets and blankets)?: None Difficulty moving from lying  on back to sitting on the side of the bed? : None Difficulty sitting down on and standing up from a chair with arms (e.g., wheelchair, bedside commode, etc,.)?: None Help needed moving to and from a bed to chair (including a wheelchair)?: A Little Help needed walking in hospital room?: A Little Help needed climbing 3-5 steps with a railing? : A Little 6 Click Score: 21    End of Session Equipment Utilized During Treatment: Oxygen;Gait belt Activity Tolerance: Patient limited by fatigue(DOE) Patient left: in bed;with call bell/phone within reach Nurse Communication: Mobility status PT Visit Diagnosis: Unsteadiness on feet (R26.81);Difficulty in walking, not elsewhere classified (R26.2)     Time: 7847-8412 PT Time Calculation (min) (ACUTE ONLY): 27 min  Charges:  $Gait Training: 23-37 mins                    G Codes:       [name], PT, DPT Acute Rehabilitation Services Pager: [phone]    [name] 10/17/2017, 11:19 AM

## 2017-10-17 NOTE — Consult Note (Addendum)
The patient has been seen in conjunction with Reino Bellis, NP-C. All aspects of care have been considered and discussed. The patient has been personally interviewed, examined, and all clinical data has been reviewed.   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  We are consulted today because echo report demonstrates worsened LV systolic function with EF 35%.  Mild cavity dilatation.  Anteroapical wall motion abnormality appreciated.  On exam moderate JVD, clear lung fields, and an S4 gallop are appreciated.  1+ bilateral lower extremity edema.  No ECG available for interpretation.  Will order one for the record.  Significant orthopnea progressive with newly diagnosed worsened systolic heart failure with regional abnormality suggesting coronary disease/prior infarction.  Near end-stage kidney disease does not leave very many options for management until there is better control of volume status.  For heart failure, hydralazine/long acting nitrates and diuresis or therapies of choice.  I would recommend giving at least 1 additional higher dose challenge with IV Lasix.  With reference to underlying coronary disease, empiric therapy including long-acting nitrates and beta-blockers as tolerated.  Not currently a candidate for angiography because of kidney function.  Overall prognosis is guarded.  Needs eventual sleep study.  Pulse oximetry during sleep to document the presence or absence of recurrent hypoxia.   Cardiology Consult    Patient ID: Kristen Berry MRN: [id number], DOB/AGE: Kristen Berry   Admit date: 10/10/2017 Date of Consult: 10/17/2017  Primary Physician: [place], Sarah T, MD Primary Cardiologist: New Requesting Provider: Carolin Sicks Reason for Consultation: CHF  Kristen Berry is a 62 y.o. female who is being seen today for the evaluation of CHF  at the request of Dr. Carolin Sicks.   Patient Profile    62 yo female with PMH of systolic HF, HTN, HL, CKD IV, osteomyelitis and PVD who presented with worsening renal function.   Past Medical History   Past Medical History:  Diagnosis Date  . Arthritis    "hands, knees" (10/10/2017)  . CHF (congestive heart failure) ([place])   . Chronic kidney disease (CKD), stage IV (severe) ([place])   . High cholesterol   . History of kidney stones   . Hypertension   . PVD (peripheral vascular disease) ([place])    "LLE; will have OR" (10/10/2017)  . Sleep apnea    "never given mask" (10/10/2017)  . Type II diabetes mellitus ([place])    "no RX anymore" (10/10/2017)    Past Surgical History:  Procedure Laterality Date  . HERNIA REPAIR  1950s  . SHOULDER OPEN ROTATOR CUFF REPAIR Left   . TUBAL LIGATION       Allergies  Allergies  Allergen Reactions  . Crestor [Rosuvastatin Calcium] Other (See Comments)    Leg pain    History of Present Illness    Kristen Berry is a 62 yo female with PMH of systolic HF, HTN, HL, CKD IV, osteomyelitis and PVD. She is followed by Dr. Justin Mend with [facility] for her CKD. Reportedly has a baseline Cr of around 2.4. She was recently discharged from [facility] after admission with acute osteomyelitis of the 2nd toe on the left foot. Reports this has been an ongoing issue and is followed by a Podiatrist. She was discharged with a PICC line and had been visiting [facility] for IV antibiotics as an outpatient. Presented on 1/14 and found to have a worsening Cr of 4 and [facility].   Reports having been on lasix 40mg  daily at home prior to admission. Has never seen a cardiologist in the past. Lasix and ARB were held at the time of admission. During this admission she became acutely short of breath and CXR showed vascular congestion. Lasix was resumed and echo ordered that showed a drop in EF to 35-40% with septal and apical akinesis, along with mild  mitral stenosis. In talking with patient she currently lives at home. Not very active but does household chores without any significant anginal symptoms. States she does have occasional chest tightness but mostly after she eats. Does reports orthopnea and has been sleeping in a recliner for the past 3 years. Intermittent LE edema per her report, and some tightness across the abd. Reports family hx of CAD with grandparents, possibly parents but she is uncertain. Little UOP noted with resumption of oral lasix, therefore she was switched to IV lasix 40mg  BID via nephrology. Reports her breathing has improved with this change, but weights have been variable. Cr has began to rise again with attempts at diuresis.   Inpatient Medications    . aspirin EC  81 mg Oral Daily  . furosemide  80 mg Intravenous BID  . gabapentin  100 mg Oral Daily  . heparin  5,000 Units Subcutaneous Q8H  . hydrALAZINE  50 mg Oral BID  . isosorbide mononitrate  60 mg Oral Daily  . labetalol  200 mg Oral BID  . sodium bicarbonate  1,300 mg Oral BID    Family History    Family History  Problem Relation Age of Onset  . Hypertension Father   . Heart failure Maternal Grandmother   . Heart failure Maternal Grandfather     Social History    Social History   Socioeconomic History  . Marital status: Divorced    Spouse name: Not on file  . Number of children: 3  . Years of education: Not on file  . Highest education level: Not on file  Social Needs  . Financial resource strain: Not on file  . Food insecurity - worry: Not on file  . Food insecurity - inability: Not on file  . Transportation needs - medical: Not on file  . Transportation needs - non-medical: Not on file  Occupational History  . Occupation: disabled  Tobacco Use  . Smoking status: Never Smoker  . Smokeless tobacco: Never Used  Substance and Sexual Activity  . Alcohol use: No  . Drug use: No  . Sexual activity: No  Other Topics Concern  . Not on  file  Social History Narrative  . Not on file     Review of Systems    See HPI  All other systems reviewed and are otherwise negative except as noted above.  Physical Exam    Blood pressure (!) 153/73, pulse (!) 103, temperature (!) 97.5 F (36.4 C), temperature source Oral, resp. rate 16, height 5\' 8"  (1.727 m), weight 244 lb 4.3 oz (110.8 kg), SpO2 99 %.  General: Pleasant, older WF, NAD.Mildy dyspneic while at rest.  Psych: Normal affect. Neuro: Alert and oriented X 3. Moves all extremities spontaneously. HEENT: Normal  Neck: Supple, mild JVD. Lungs:  Resp regular and unlabored, diminished bilaterally. Heart: RRR no s3, s4, soft systolic murmurs. Abdomen: Soft, non-tender, non-distended, BS + x 4.  Extremities: No clubbing, cyanosis, mild LE edema. Left foot wrapped.  Labs    Troponin [facility] of Care Test) No results for input(s):  TROPIPOC in the last 72 hours. Recent Labs    10/17/17 0426  CKTOTAL 109   Lab Results  Component Value Date   WBC 15.3 (H) 10/13/2017   HGB 9.5 (L) 10/13/2017   HCT 29.9 (L) 10/13/2017   MCV 87.2 10/13/2017   PLT 595 (H) 10/13/2017    Recent Labs  Lab 10/17/17 0426  NA 135  K 5.4*  CL 104  CO2 15*  BUN 83*  CREATININE 3.80*  CALCIUM 8.7*  GLUCOSE 152*   No results found for: CHOL, HDL, LDLCALC, TRIG No results found for: [facility]   Radiology Studies    Dg Chest 1 View  Result Date: 10/10/2017 CLINICAL DATA:  62 year old female status post PICC placement. EXAM: CHEST 1 VIEW COMPARISON:  Chest radiograph dated 12/13/2014 FINDINGS: Right-sided PICC with tip over central SVC. Mild cardiomegaly with mild vascular congestion diffuse interstitial prominence, likely mild edema. There is no focal consolidation, pleural effusion, or pneumothorax. No acute osseous pathology. IMPRESSION: 1. Right-sided PICC with tip over central SVC. 2. Mild cardiomegaly with vascular congestion and mild edema. No focal consolidation. Electronically Signed    By: Anner Crete M.D.   On: 10/10/2017 22:33   US Renal  Result Date: 10/13/2017 CLINICAL DATA:  Acute kidney injury. EXAM: RENAL / URINARY TRACT ULTRASOUND COMPLETE COMPARISON:  None. FINDINGS: Right Kidney: Length: 11.8 cm. Minimally increased echogenicity of renal parenchyma is noted. No mass or hydronephrosis visualized. Left Kidney: Length: 9 cm. Minimally increased echogenicity of renal parenchyma is noted. No mass or hydronephrosis visualized. Bladder: Appears normal for degree of bladder distention. IMPRESSION: Minimally increased echogenicity of renal parenchyma is noted which may represent medical renal disease. No hydronephrosis or renal obstruction is noted. Electronically Signed   By: Marijo Conception, M.D.   On: 10/13/2017 16:13   Dg Chest Port 1 View  Result Date: 10/15/2017 CLINICAL DATA:  62 year old female with increased shortness of breath. EXAM: PORTABLE CHEST 1 VIEW COMPARISON:  Chest radiograph dated 10/14/2017 FINDINGS: Right-sided PICC with tip over central SVC. There is mild cardiomegaly with vascular congestion and edema, worsened compared to the earlier radiograph. Bilateral upper lobe predominant airspace densities concerning for pneumonia, worsened compared to the prior radiograph. Probable small right pleural effusion. No pneumothorax. No acute osseous pathology. IMPRESSION: Cardiomegaly with vascular congestion and edema. Overall interval worsening of the pulmonary congestion and upper lobe predominant opacities compared to prior radiograph. Follow-up recommended. Electronically Signed   By: Anner Crete M.D.   On: 10/15/2017 03:00   Dg Chest Port 1 View  Result Date: 10/14/2017 CLINICAL DATA:  Worsening shortness of breath. EXAM: PORTABLE CHEST 1 VIEW COMPARISON:  Single-view of the chest 10/10/2017 and 12/13/2014. FINDINGS: Right PICC is unchanged. There is cardiomegaly. There has been worsening of airspace disease in the periphery of the right upper lobe. Patchy  bilateral airspace opacity throughout the remainder of the right lung does not appear markedly changed. There is also left basilar airspace disease. The lungs appear emphysematous. IMPRESSION: Persistent right worse than left airspace disease. Aeration in the right upper lobe has worsened since the prior examination compatible with progressive pneumonia. Electronically Signed   By: Inge Rise M.D.   On: 10/14/2017 14:12    ECG & Cardiac Imaging    Echo: 10/17/17  Study Conclusions  - Left ventricle: Septal and apical akinesis. The cavity size was   moderately dilated. Wall thickness was increased in a pattern of   mild LVH. Systolic function was moderately reduced. The  estimated   ejection fraction was in the range of 35% to 40%. Doppler   parameters are consistent with both elevated ventricular   end-diastolic filling pressure and elevated left atrial filling   pressure. - Mitral valve: Moderately calcified, moderately thickened annulus.   Moderately thickened leaflets . The findings are consistent with   mild stenosis. There was moderate regurgitation. Valve area by   continuity equation (using LVOT flow): 1.5 cm^2. - Left atrium: The atrium was mildly dilated. - Atrial septum: No defect or patent foramen ovale was identified. - Pulmonary arteries: PA peak pressure: 55 mm Hg (S).  Assessment & Plan    62 yo female with PMH of systolic HF, HTN, HL, CKD IV, osteomyelitis and PVD who presented with worsening renal function.   1. Acute on Chronic systolic HF: Lasix was held on admission 2/2 worsening renal function. Then developed acute dyspnea and restarted on oral--> IV. CXR with edema 1/19 Reported had a EF of 45% in the past, but echo this admission shows a drop in EF to 35% with septal and apical hypokinesis. She has diuresed some but still dyspneic at rest. Given fall in EF this is concerning for underlying coronary disease, but would not pursue cath until hopefully improvement  in renal function. Suspect she is still volume overloaded but difficult to discern on exam. Check EKG and BNP. -- consider switching labetalol to coreg  -- discussed with MD and will increase lasix from 40mg  BID to 80mg  BID for 2 doses and follow up on UOP.   2. CKD IV: Followed by Dr. Justin Mend as outpatient. Cr peaked at 4.1 this admission. Was trending down but now rising with diuresis. Nephrology following.   3. Osteomyelitis: On IV antibiotics per primary  4. HTN: elevated. Recommendations as above  5. Hx of DM: Was on metformin in the past, but Hgb A1c improved per her report. Hgb A1c 5.5 here.   6. Anemia: 10>>9.8>>9.5. Iron studies pending  7. HL?: thinks she has been on cholesterol lowering medications in the past.  -- check lipids  Signed, Reino Bellis, NP-C Pager [phone] 10/17/2017, 3:56 PM

## 2017-10-17 NOTE — Progress Notes (Signed)
  Echocardiogram 2D Echocardiogram has been performed.  Kristen Berry 10/17/2017, 9:47 AM

## 2017-10-17 NOTE — Progress Notes (Signed)
Occupational Therapy Treatment Patient Details Name: Kristen Berry MRN: [id number] DOB: [birth year] Today's Date: 10/17/2017    History of present illness Patient is a 62 year old female who presented to the hospital with shortness of breath. She was found to have an acute kidney injury. PMH: PVD, HTN, CHF CKD, OA    OT comments  Pt seated on EOB upon entering the room with no c/o pain. Pt agreeable to OT intervention but declined transfers and ambulating this session. OT educated and demonstrated use of level 2 theraband for B UE strengthening exercises. Pt returning 2 sets of 10 for bicep curls, chest pulls, and alternating punches with min cues for proper technique. Heavy focus on pursed lip breathing with exercise. O2 saturation remained above 94% with exercise while on 2 L O2 via [place]. Pt's resting heart rate at 104 and elevated to 113 with exercise. Pt has significant perceived exertion with exercise and required frequent rest breaks. OT educating pt on energy conservation for self care tasks as well. Education to continue. Pt continues to benefit from OT intervention.    Follow Up Recommendations  Home health OT;Supervision/Assistance - 24 hour    Equipment Recommendations  None recommended by OT    Recommendations for Other Services      Precautions / Restrictions Precautions Precautions: Fall Restrictions Weight Bearing Restrictions: No       Mobility Bed Mobility    General bed mobility comments: seated on EOB upon arrival  Transfers    Balance   Sitting-balance support: Feet supported;No upper extremity supported Sitting balance-Leahy Scale: Good       ADL either performed or assessed with clinical judgement     Cognition Arousal/Alertness: Awake/alert Behavior During Therapy: WFL for tasks assessed/performed Overall Cognitive Status: Within Functional Limits for tasks assessed                      Pertinent Vitals/ Pain       Pain Assessment:  No/denies pain         Frequency  Min 2X/week        Progress Toward Goals  OT Goals(current goals can now be found in the care plan section)  Progress towards OT goals: Progressing toward goals  Acute Rehab OT Goals Patient Stated Goal: to breath better and get stronger OT Goal Formulation: With patient Time For Goal Achievement: 10/31/17 Potential to Achieve Goals: Good  Plan         AM-PAC PT "6 Clicks" Daily Activity     Outcome Measure   Help from another person eating meals?: A Little Help from another person taking care of personal grooming?: A Little Help from another person toileting, which includes using toliet, bedpan, or urinal?: A Little Help from another person bathing (including washing, rinsing, drying)?: A Little Help from another person to put on and taking off regular upper body clothing?: A Little Help from another person to put on and taking off regular lower body clothing?: A Little 6 Click Score: 18    End of Session Equipment Utilized During Treatment: Oxygen(2L via [place])  OT Visit Diagnosis: Other abnormalities of gait and mobility (R26.89)   Activity Tolerance Patient tolerated treatment well   Patient Left in bed;with call bell/phone within reach   Nurse Communication          Time: 0930-1000 OT Time Calculation (min): 30 min  Charges: OT General Charges $OT Visit: 1 Visit OT Treatments $Therapeutic Activity: 8-22 mins $Therapeutic Exercise:  8-22 mins     Gypsy Decant 10/17/2017, 10:33 AM

## 2017-10-17 NOTE — Progress Notes (Signed)
EKG showed abnormal ST &T wave abnormality.  Md notified. Cardiologist was paged.   Waiting for further instructions.

## 2017-10-17 NOTE — Progress Notes (Addendum)
PROGRESS NOTE    Kristen Berry  MOQ:[id number] DOB: 1956-06-25 DOA: 10/10/2017 PCP: Martinique, Sarah T, MD   Brief Narrative: 62 year old female with history of peripheral vascular disease, chronic kidney disease stage IV with baseline creatinine around 2.4, recently diagnosed with left toe osteomyelitis at [facility] and discharged with IV daptomycin and ceftriaxone. Patient was receiving IV antibiotics daily at [facility] before transfer here. As per prior note, it was discussed with the surgery and no surgical procedure was recommended due to peripheral vascular disease. The plan was to follow-up with vascular and orthopedics as outpatient.  Patient was transferred to [facility] for worsening renal failure and for nephrology consult.  Assessment & Plan:  #Acute kidney injury on CKD stage IV: Initially mildly improved but now worsening creatinine likely contributed by the use of Lasix.  Patient has pulmonary edema and has CHF therefore requiring Lasix.  Initial UA has no protein and RBCs. Complements normal.  Renal ultrasound consistent with chronic kidney disease.   -On reviewing chart patient had abnormal kappa lambda ratio in the past, I will recheck again. -Minimal urine output with IV Lasix.  Bladder scan ordered.  Discussed with the nurse.  Strict ins and out. -Monitor BMP.  Continue sodium bicarbonate for metabolic acidosis.  I reviewed this with the patient at bedside.  Nephrology consult appreciated. -UA and urine eosinophils since patient is on antibiotics. -Check CK   #Shortness of breath/acute pulmonary edema/acute systolic congestive heart failure: Chest x-ray with cardiomegaly with vascular congestion and edema.   -Continue IV Lasix twice a day.  May need higher doses.  Check albumin/CMP.  Check BNP level. -Echocardiogram with EF of 35-40%.  Cardiology consult requested. -Continue aspirin, hydralazine, Imdur, labetalol.  #History of mild intermittent  asthma: Do not think his asthma is recent.  Discontinue steroid.  #Left second toe osteomyelitis: As per prior progress note, she will follow-up with orthopedics outpatient and plan for vascular procedures. She was recently discharged from [facility] with IV daptomycin and ceftriaxone. As per patient she was getting IV antibiotics daily at the hospital and the plan is to continue until November 11, 2017. I have discussed with the case manager.The [facility] is requesting the prescription for antibiotics to resume after discharge. I consulted pharmacist.  The resumption order for antibiotics prescribed on discharge.  I have discussed with the patient at bedside in detail regarding importance of following up with infectious disease vascular and orthopedics.  She verbalized understanding.  #Type 2 diabetes: Continue to monitor blood sugar level. A1c 5.5.  #Hypertension: Blood pressure elevated.  Continue IV Lasix and current cardiac medication.  Continue to monitor.  #Physical deconditioning: PT OT evaluation.   DVT prophylaxis: Heparin subcutaneous Code Status: Full code Family Communication: No family at bedside. Disposition Plan: Transfer to telemetry bed.    Consultants:   Nephrology  Cardiology  Procedures: None Antimicrobials: On daptomycin and IV ceftriaxone since before admission  Subjective: Seen and examined at bedside.  Having shortness of breath and cough.  Spelled mildly better last night.  No chest pain.  No nausea vomiting or headache.  Objective: Vitals:   10/16/17 1352 10/16/17 1745 10/16/17 2024 10/17/17 0359  BP: 137/69  138/65 (!) 166/63  Pulse: 98  (!) 104 87  Resp: 20  (!) 21 12  Temp: (!) 97.5 F (36.4 C)  (!) 97.5 F (36.4 C) (!) 97.5 F (36.4 C)  TempSrc: Axillary  Axillary Oral  SpO2: 99%  99% 97%  Weight:  104.5 kg (230 lb 6.1 oz)  110.8 kg (244 lb 4.3 oz)  Height:        Intake/Output Summary (Last 24 hours) at 10/17/2017  1342 Last data filed at 10/17/2017 1000 Gross per 24 hour  Intake 823.4 ml  Output 400 ml  Net 423.4 ml   Filed Weights   10/14/17 1300 10/16/17 1745 10/17/17 0359  Weight: 110.1 kg (242 lb 11.6 oz) 104.5 kg (230 lb 6.1 oz) 110.8 kg (244 lb 4.3 oz)    Examination:  General exam: Sitting on bed with nasal cannula, not in distress. Respiratory system: Bibasilar crackles, no wheezing, no increased work of breathing. Cardiovascular system: Regular rate rhythm, S1-S2 normal. Trace pedal edema. Gastrointestinal system: Soft, nontender.  Nondistended.  Bowel sounds positive. Central nervous system: Alert awake and following commands. Extremities: Trace edema lower extremity, dressing applied in left toe. Skin: No rashes, lesions or ulcers Psychiatry: Judgement and insight appear normal. Mood & affect appropriate.     Data Reviewed: I have personally reviewed following labs and imaging studies  CBC: Recent Labs  Lab 10/10/17 2120 10/11/17 0424 10/13/17 0325  WBC 13.0* 12.0* 15.3*  NEUTROABS 10.2*  --   --   HGB 10.0* 9.8* 9.5*  HCT 31.3* 29.9* 29.9*  MCV 86.7 85.9 87.2  PLT 541* 507* 387*   Basic Metabolic Panel: Recent Labs  Lab 10/10/17 2120 10/11/17 0424 10/12/17 0449 10/13/17 0325 10/14/17 0345 10/16/17 0511 10/17/17 0426  NA 132* 134* 133* 135 135 135 135  K 4.1 4.4 4.3 4.8 4.5 4.6 5.4*  CL 103 104 103 106 107 105 104  CO2 14* 14* 14* 14* 13* 15* 15*  GLUCOSE 85 84 81 92 67 100* 152*  BUN 61* 62* 61* 65* 62* 67* 83*  CREATININE 4.18* 4.17* 3.79* 3.50* 3.50* 3.65* 3.80*  CALCIUM 7.4* 7.4* 7.3* 7.3* 7.7* 8.3* 8.7*  MG  --   --  1.9  --   --   --   --   PHOS 6.3* 6.8* 6.1* 6.0*  --   --   --    GFR: Estimated Creatinine Clearance: 20 mL/min (A) (by C-G formula based on SCr of 3.8 mg/dL (H)). Liver Function Tests: Recent Labs  Lab 10/10/17 2120 10/11/17 0424 10/12/17 0449 10/13/17 0325  ALBUMIN 2.8* 2.7* 2.5* 2.8*   No results for input(s): LIPASE,  AMYLASE in the last 168 hours. No results for input(s): AMMONIA in the last 168 hours. Coagulation Profile: No results for input(s): INR, PROTIME in the last 168 hours. Cardiac Enzymes: Recent Labs  Lab 10/13/17 0325 10/17/17 0426  CKTOTAL 80 109   BNP (last 3 results) No results for input(s): PROBNP in the last 8760 hours. HbA1C: No results for input(s): HGBA1C in the last 72 hours. CBG: No results for input(s): GLUCAP in the last 168 hours. Lipid Profile: No results for input(s): CHOL, HDL, LDLCALC, TRIG, CHOLHDL, LDLDIRECT in the last 72 hours. Thyroid Function Tests: No results for input(s): TSH, T4TOTAL, FREET4, T3FREE, THYROIDAB in the last 72 hours. Anemia Panel: No results for input(s): VITAMINB12, FOLATE, FERRITIN, TIBC, IRON, RETICCTPCT in the last 72 hours. Sepsis Labs: No results for input(s): PROCALCITON, LATICACIDVEN in the last 168 hours.  No results found for this or any previous visit (from the past 240 hour(s)).       Radiology Studies: No results found.      Scheduled Meds: . aspirin EC  81 mg Oral Daily  . darbepoetin (ARANESP) injection - NON-DIALYSIS  60 mcg Subcutaneous Once  . furosemide  40 mg Intravenous BID  . gabapentin  100 mg Oral Daily  . heparin  5,000 Units Subcutaneous Q8H  . hydrALAZINE  50 mg Oral BID  . isosorbide mononitrate  60 mg Oral Daily  . labetalol  200 mg Oral BID  . [START ON 10/18/2017] predniSONE  40 mg Oral Q breakfast  . sodium bicarbonate  1,300 mg Oral BID   Continuous Infusions: . cefTRIAXone (ROCEPHIN)  IV Stopped (10/16/17 1531)  . DAPTOmycin (CUBICIN)  IV Stopped (10/16/17 1815)     LOS: 7 days    Jakwan Sally Tanna Furry, MD [facility] Pager [phone]  If 7PM-7AM, please contact night-coverage [email or web address] Password [email or web address] 10/17/2017, 1:42 PM

## 2017-10-17 NOTE — Progress Notes (Signed)
Kristen Berry Progress Note    Assessment/ Plan:    1. Acute kidney injury on chronic kidney disease stage IV: from the history suspicious for hemodynamically mediated acute kidney injury. 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 Complements normal. Holding  ARB.  Avoid nephrotoxins including NSAIDs and avoid iodinated intravenous contrast exposure. Limit hypotensive episodes to allow for renal recovery. Baseline cr appears to be 2.4- peaked over 4- then down to 3.5- but over last 48 hours up now to 3.8 with BUN in the 80's.  Lasix increased but UOP recorded is lower. dont have clear cut reason for worsening last 2 days- would not discharge with crt and BUN rising.   Pt has followup with Dr. Justin Mend 11/01/2017 at 10:45 as OP.   2.  Acute diastolic CHF: Takes Lasix 40 mg PO at home.  Last CXR suggestive of pulmonary edema On lasix 40 IV BID here.  Holding Losartan, is on hydralazine.  2. Osteomyelitis second left toe/cellulitis: ongoing intravenous antibiotic therapy with ceftriaxone and daptomycin  3. Hypertension: As above in #1 and 2- BP if anything is high- on steroids  5. Diabetes mellitus: Management per primary service.  6.  Dispo: pending improvement in breathing  7. Metabolic acidosis- on high dose bicarb  8. Anemia- hgb down- check iron stores and give ESA     Subjective:    Still SOB - had breathing tx said is better after that- getting up with PT- thinks UOP recording is accurate   Objective:   BP (!) 166/63 (BP Location: Left Arm)   Pulse 87   Temp (!) 97.5 F (36.4 C) (Oral)   Resp 12   Ht 5\' 8"  (1.727 m)   Wt 110.8 kg (244 lb 4.3 oz)   SpO2 97%   BMI 37.14 kg/m   Intake/Output Summary (Last 24 hours) at 10/17/2017 1013 Last data filed at 10/17/2017 1000 Gross per 24 hour  Intake 823.4 ml  Output 400 ml  Net 423.4 ml   Weight change:   Physical Exam: Gen: sitting in bed, still a little SOB HEENT: +JVD CVS: RRR no m/r/g Resp: slightly increased  WOB but lungs don't sound  coarse  Abd: soft, notender Ext: trace LE edema, RUE PICC in place MSK: L foot with L 2nd toe osteomyelitis   Imaging: No results found.  Labs: BMET Recent Labs  Lab 10/10/17 2120 10/11/17 0424 10/12/17 0449 10/13/17 0325 10/14/17 0345 10/16/17 0511 10/17/17 0426  NA 132* 134* 133* 135 135 135 135  K 4.1 4.4 4.3 4.8 4.5 4.6 5.4*  CL 103 104 103 106 107 105 104  CO2 14* 14* 14* 14* 13* 15* 15*  GLUCOSE 85 84 81 92 67 100* 152*  BUN 61* 62* 61* 65* 62* 67* 83*  CREATININE 4.18* 4.17* 3.79* 3.50* 3.50* 3.65* 3.80*  CALCIUM 7.4* 7.4* 7.3* 7.3* 7.7* 8.3* 8.7*  PHOS 6.3* 6.8* 6.1* 6.0*  --   --   --    CBC Recent Labs  Lab 10/10/17 2120 10/11/17 0424 10/13/17 0325  WBC 13.0* 12.0* 15.3*  NEUTROABS 10.2*  --   --   HGB 10.0* 9.8* 9.5*  HCT 31.3* 29.9* 29.9*  MCV 86.7 85.9 87.2  PLT 541* 507* 595*    Medications:    . aspirin EC  81 mg Oral Daily  . furosemide  40 mg Intravenous BID  . gabapentin  100 mg Oral Daily  . heparin  5,000 Units Subcutaneous Q8H  . hydrALAZINE  50 mg  Oral BID  . isosorbide mononitrate  60 mg Oral Daily  . labetalol  200 mg Oral BID  . methylPREDNISolone (SOLU-MEDROL) injection  80 mg Intravenous Q12H  . sodium bicarbonate  1,300 mg Oral BID      Kyarah Enamorado A  10/17/2017, 10:13 AM

## 2017-10-17 NOTE — Progress Notes (Signed)
Pharmacy Antibiotic Note  Kristen Berry is a 62 y.o. female admitted on 10/10/2017 with worsening renal function.  Of note, patient was admitted to [name] earlier this month and was discharged home on Cubicin and Rocephin for osteomyelitis of the left 2nd toe.  Per patient, she has been going to [name] daily to get her IV antibiotics and has missed her dose on 10/09/17 due to the ice/snow.  Pharmacy has been consulted for Cubicin dosing.  Patient has baseline CKD4 and her renal function is worsening.  She is afebrile but her WBC is trending up.  Patient's admit weight was 106 kg.  CK is WNL but also trending up.   Plan: Continue Cubicin 660mg  IV Q48H (~8 mg/kg AdjBW) Continue CTX 2gm IV Q24H  Monitor renal fxn, clinical progress, CK q-Thurs and may need twice weekly with worsening renal fxn   Height: 5\' 8"  (172.7 cm) Weight: 244 lb 4.3 oz (110.8 kg) IBW/kg (Calculated) : 63.9  AdjBW = 83 kg  Temp (24hrs), Avg:97.5 F (36.4 C), Min:97.5 F (36.4 C), Max:97.5 F (36.4 C)  Recent Labs  Lab 10/10/17 2120 10/11/17 0424 10/12/17 0449 10/13/17 0325 10/14/17 0345 10/16/17 0511 10/17/17 0426  WBC 13.0* 12.0*  --  15.3*  --   --   --   CREATININE 4.18* 4.17* 3.79* 3.50* 3.50* 3.65* 3.80*    Estimated Creatinine Clearance: 20 mL/min (A) (by C-G formula based on SCr of 3.8 mg/dL (H)).    Allergies  Allergen Reactions  . Crestor [Rosuvastatin Calcium] Other (See Comments)    Leg pain     Vanc 1/3 PTA >> 10-26-22 Cubicin 26-Oct-2022 PTA >> (2/15) CTX 1/6 PTA >> (2/15)  1/17 CK = 80 1/24 CK = 109   Kristen Berry D. Mina Marble, PharmD, BCPS Pager:  [phone] 10/17/2017, 12:33 PM

## 2017-10-17 NOTE — Care Management (Signed)
Spoke to [employer]  at [place] phone [phone] and fax [phone], when patient is discharged from [place] will need new orders for IV ABX. At discharge they will have to be called to schedule a patient , prescription will have to be faxed and original prescription will have to be sent with patient.   Magdalen Spatz RN BSN [phone]

## 2017-10-18 DIAGNOSIS — I5023 Acute on chronic systolic (congestive) heart failure: Secondary | ICD-10-CM

## 2017-10-18 DIAGNOSIS — M86172 Other acute osteomyelitis, left ankle and foot: Secondary | ICD-10-CM

## 2017-10-18 DIAGNOSIS — E1140 Type 2 diabetes mellitus with diabetic neuropathy, unspecified: Secondary | ICD-10-CM

## 2017-10-18 LAB — IRON AND TIBC
Iron: 37 ug/dL (ref 28–170)
Saturation Ratios: 18 % (ref 10.4–31.8)
TIBC: 209 ug/dL — ABNORMAL LOW (ref 250–450)
UIBC: 172 ug/dL

## 2017-10-18 LAB — COMPREHENSIVE METABOLIC PANEL
ALT: 13 U/L — ABNORMAL LOW (ref 14–54)
AST: 13 U/L — ABNORMAL LOW (ref 15–41)
Albumin: 2.8 g/dL — ABNORMAL LOW (ref 3.5–5.0)
Alkaline Phosphatase: 75 U/L (ref 38–126)
Anion gap: 17 — ABNORMAL HIGH (ref 5–15)
BUN: 103 mg/dL — ABNORMAL HIGH (ref 6–20)
CO2: 15 mmol/L — ABNORMAL LOW (ref 22–32)
Calcium: 8.7 mg/dL — ABNORMAL LOW (ref 8.9–10.3)
Chloride: 102 mmol/L (ref 101–111)
Creatinine, Ser: 3.98 mg/dL — ABNORMAL HIGH (ref 0.44–1.00)
GFR calc Af Amer: 13 mL/min — ABNORMAL LOW (ref >60)
GFR calc non Af Amer: 11 mL/min — ABNORMAL LOW (ref >60)
Glucose, Bld: 212 mg/dL — ABNORMAL HIGH (ref 65–99)
Potassium: 5 mmol/L (ref 3.5–5.1)
Sodium: 134 mmol/L — ABNORMAL LOW (ref 135–145)
Total Bilirubin: 0.5 mg/dL (ref 0.3–1.2)
Total Protein: 7 g/dL (ref 6.5–8.1)

## 2017-10-18 LAB — CK: Total CK: 93 U/L (ref 38–234)

## 2017-10-18 LAB — LIPID PANEL
Cholesterol: 184 mg/dL (ref 0–200)
HDL: 39 mg/dL — ABNORMAL LOW (ref >40)
LDL Cholesterol: 109 mg/dL — ABNORMAL HIGH (ref 0–99)
Total CHOL/HDL Ratio: 4.7 RATIO
Triglycerides: 180 mg/dL — ABNORMAL HIGH (ref <150)
VLDL: 36 mg/dL (ref 0–40)

## 2017-10-18 LAB — FERRITIN: Ferritin: 118 ng/mL (ref 11–307)

## 2017-10-18 MED ORDER — SODIUM CHLORIDE 0.9 % IV SOLN
125.0000 mg | Freq: Every day | INTRAVENOUS | Status: AC
  Administered 2017-10-18 – 2017-10-21 (×4): 125 mg via INTRAVENOUS
  Filled 2017-10-18 (×6): qty 10

## 2017-10-18 MED ORDER — FUROSEMIDE 10 MG/ML IJ SOLN
80.0000 mg | Freq: Two times a day (BID) | INTRAMUSCULAR | Status: DC
  Administered 2017-10-18 – 2017-10-20 (×5): 80 mg via INTRAVENOUS
  Filled 2017-10-18 (×5): qty 8

## 2017-10-18 NOTE — Progress Notes (Signed)
PROGRESS NOTE    Kristen Berry  MCN:[id number] DOB: [birth year] DOA: 10/10/2017 PCP: Martinique, Sarah T, MD   Brief Narrative: 62 year old female with history of peripheral vascular disease, chronic kidney disease stage IV with baseline creatinine around 2.4, recently diagnosed with left toe osteomyelitis at [facility] and discharged with IV daptomycin and ceftriaxone. Patient was receiving IV antibiotics daily at [facility] before transfer here. As per prior note, it was discussed with the surgery and no surgical procedure was recommended due to peripheral vascular disease. The plan was to follow-up with vascular and orthopedics as outpatient.  Patient was transferred to [facility] for worsening renal failure and for nephrology consult.  Assessment & Plan:  #Acute kidney injury on CKD stage IV: Worsening renal failure in the setting of congestive heart failure.  Patient has no good response with IV Lasix.  Discussed with the nephrologist and with the patient today.  Likely require hemodialysis.  I have discussed this with the patient and her daughter over the phone.  They agreed with the plan.  For now I will increase the dose of Lasix 80 mg IV twice a day.  Likely starting hemodialysis tomorrow depending on her clinical response and labs.  Nephrology consult appreciated.    UA has no protein and RBCs. Complements normal.  Renal ultrasound consistent with chronic kidney disease.    #Shortness of breath/acute pulmonary edema/acute systolic congestive heart failure: Chest x-ray with cardiomegaly with vascular congestion and edema.   -Elevated BNP.  Increase the dose of Lasix.  He still having shortness of breath and cough. -Echocardiogram with EF of 35-40%.  Cardiology consult appreciated -Continue aspirin, hydralazine, Imdur, labetalol.  #History of mild intermittent asthma: Stable, no exacerbation.    #Left second toe osteomyelitis: As per prior progress note, she  will follow-up with orthopedics outpatient and plan for vascular procedures. She was recently discharged from [facility] with IV daptomycin and ceftriaxone. As per patient she was getting IV antibiotics daily at the hospital and the plan is to continue until November 11, 2017. I have discussed with the case manager.The [facility] is requesting the prescription for antibiotics to resume after discharge. I consulted pharmacist.  The resumption order for antibiotics prescribed on discharge.  I have discussed with the patient at bedside in detail regarding importance of following up with infectious disease vascular and orthopedics.  She verbalized understanding.  #Type 2 diabetes: Continue to monitor blood sugar level. A1c 5.5.  #Hypertension: Blood pressure elevated.  Continue IV Lasix and current cardiac medication.  Continue to monitor.  #Physical deconditioning: PT OT evaluation.   DVT prophylaxis: Heparin subcutaneous Code Status: Full code Family Communication: Discussed with the patient's daughter over the phone. Disposition Plan: Transfer to telemetry bed.    Consultants:   Nephrology  Cardiology  Procedures: None Antimicrobials: On daptomycin and IV ceftriaxone since before admission  Subjective: Seen and examined at bedside.  Having cough, shortness of breath.  Minimal response with IV Lasix.  Denies chest pain.  No nausea or vomiting. Objective: Vitals:   10/17/17 0359 10/17/17 1428 10/17/17 2108 10/18/17 0528  BP: (!) 166/63 (!) 153/73 (!) 154/68 (!) 171/65  Pulse: 87 (!) 103 98 74  Resp: 12 16 16 18   Temp: (!) 97.5 F (36.4 C) (!) 97.5 F (36.4 C) (!) 97.5 F (36.4 C) (!) 97.5 F (36.4 C)  TempSrc: Oral Oral Axillary Oral  SpO2: 97% 99% 97% 97%  Weight: 110.8 kg (244 lb 4.3 oz)  111.9 kg (246 lb 11.1 oz)  Height:        Intake/Output Summary (Last 24 hours) at 10/18/2017 1127 Last data filed at 10/18/2017 1123 Gross per 24 hour  Intake  1080 ml  Output 950 ml  Net 130 ml   Filed Weights   10/16/17 1745 10/17/17 0359 10/18/17 0528  Weight: 104.5 kg (230 lb 6.1 oz) 110.8 kg (244 lb 4.3 oz) 111.9 kg (246 lb 11.1 oz)    Examination:  General exam: Sitting in bed comfortable, not in distress Respiratory system: Bibasal crackles, no wheezing, respiratory effort normal. Cardiovascular system: Regular rate rhythm S1-S2 normal.  Trace lower extremity edema. Gastrointestinal system: Soft, nontender.  Nondistended.  Bowel sounds positive. Central nervous system: Alert awake and following commands. Extremities: dressing applied in left toe. Skin: No rashes, lesions or ulcers Psychiatry: Judgement and insight appear normal. Mood & affect appropriate.     Data Reviewed: I have personally reviewed following labs and imaging studies  CBC: Recent Labs  Lab 10/13/17 0325  WBC 15.3*  HGB 9.5*  HCT 29.9*  MCV 87.2  PLT 195*   Basic Metabolic Panel: Recent Labs  Lab 10/12/17 0449 10/13/17 0325 10/14/17 0345 10/16/17 0511 10/17/17 0426 10/18/17 0507  NA 133* 135 135 135 135 134*  K 4.3 4.8 4.5 4.6 5.4* 5.0  CL 103 106 107 105 104 102  CO2 14* 14* 13* 15* 15* 15*  GLUCOSE 81 92 67 100* 152* 212*  BUN 61* 65* 62* 67* 83* 103*  CREATININE 3.79* 3.50* 3.50* 3.65* 3.80* 3.98*  CALCIUM 7.3* 7.3* 7.7* 8.3* 8.7* 8.7*  MG 1.9  --   --   --   --   --   PHOS 6.1* 6.0*  --   --   --   --    GFR: Estimated Creatinine Clearance: 19.2 mL/min (A) (by C-G formula based on SCr of 3.98 mg/dL (H)). Liver Function Tests: Recent Labs  Lab 10/12/17 0449 10/13/17 0325 10/18/17 0507  AST  --   --  13*  ALT  --   --  13*  ALKPHOS  --   --  75  BILITOT  --   --  0.5  PROT  --   --  7.0  ALBUMIN 2.5* 2.8* 2.8*   No results for input(s): LIPASE, AMYLASE in the last 168 hours. No results for input(s): AMMONIA in the last 168 hours. Coagulation Profile: No results for input(s): INR, PROTIME in the last 168 hours. Cardiac  Enzymes: Recent Labs  Lab 10/13/17 0325 10/17/17 0426 10/18/17 0507  CKTOTAL 80 109 93   BNP (last 3 results) No results for input(s): PROBNP in the last 8760 hours. HbA1C: No results for input(s): HGBA1C in the last 72 hours. CBG: No results for input(s): GLUCAP in the last 168 hours. Lipid Profile: Recent Labs    10/18/17 0507  CHOL 184  HDL 39*  LDLCALC 109*  TRIG 180*  CHOLHDL 4.7   Thyroid Function Tests: No results for input(s): TSH, T4TOTAL, FREET4, T3FREE, THYROIDAB in the last 72 hours. Anemia Panel: Recent Labs    10/18/17 0507  FERRITIN 118  TIBC 209*  IRON 37   Sepsis Labs: No results for input(s): PROCALCITON, LATICACIDVEN in the last 168 hours.  No results found for this or any previous visit (from the past 240 hour(s)).       Radiology Studies: No results found.      Scheduled Meds: . aspirin EC  81 mg Oral  Daily  . gabapentin  100 mg Oral Daily  . heparin  5,000 Units Subcutaneous Q8H  . hydrALAZINE  50 mg Oral BID  . isosorbide mononitrate  120 mg Oral Daily  . labetalol  200 mg Oral BID  . sodium bicarbonate  1,300 mg Oral BID   Continuous Infusions: . cefTRIAXone (ROCEPHIN)  IV Stopped (10/17/17 1500)  . DAPTOmycin (CUBICIN)  IV Stopped (10/16/17 1815)  . ferric gluconate (FERRLECIT/NULECIT) IV       LOS: 8 days    Dron Tanna Furry, MD [facility] Pager [phone]  If 7PM-7AM, please contact night-coverage [email or web address] Password [facility] 10/18/2017, 11:27 AM

## 2017-10-18 NOTE — Progress Notes (Signed)
1120 Paged Dr. Carolin Sicks- Kristen Berry's daughter is requesting a call regarding her mothers kidney function and current update. [name] Ruggerio [phone].

## 2017-10-18 NOTE — Progress Notes (Signed)
Cardiology Follow-up Note   The patient has acute on chronic combined systolic and diastolic heart failure.  This is complicated by essentially end stage kidney disease.  Volume status is not excessively elevated however the patient is suffering from evidence of heart failure.    We gave 80 mg of Lasix IV last evening rather than 40.  No real response.  Creatinine 3.93 with BUN greater than 100.  Minimal urine output for the past 2 days.  Lasix is now on hold  until nephrology sees the patient later today.  Overall impression is that of acute on chronic combined systolic and diastolic heart failure, probably on the basis of significant underlying ischemic heart disease.  Further management and workup is impaired by the patient's stage IV-V acute on chronic kidney disease.  RECOMMENDATION: Our hands are tied with reference to any further cardiac workup.  Coronary angiography should be done at some point to help define why LV function has decreased.  Heart failure symptoms are likely related to significant diastolic dysfunction related to ischemic heart disease.  Until kidney function turns around all the patient is on dialysis, contrast administration is not possible.  Medical therapy includes blood pressure control with hydralazine and long-acting nitrates

## 2017-10-18 NOTE — Progress Notes (Signed)
[facility] Progress Note    Assessment/ Plan:    1. Acute kidney injury on chronic kidney disease stage IV: Baseline kidney function not great. from the history suspicious for hemodynamically mediated acute kidney injury. UA was not reflective of post-infectious GN. Complements normal. Holding  ARB.  Avoiding nephrotoxins. Limiting hypotensive episodes to allow for renal recovery. Baseline cr appears to be 2.4- peaked over 4- then down to 3.5- but over last 72 hours up now to 3.98 with BUN now over 100.  Lasix increased but UOP recorded is not responding. dont have clear cut reason for worsening last 3 days but baseline function certainly was marginal, now getting to danger range and uremic sxms- needed to talk today about the possibility of dialysis- not sure if will need long term or short term.  If labs worse tomorrow pt agreeable to proceed with PC and first HD  2.  Acute diastolic CHF: Takes Lasix 40 mg PO at home.  Last CXR suggestive of pulmonary edema On lasix  IV here- not a robust response.  Holding Losartan, is on hydralazine and labetalol, BP is not low.  2. Osteomyelitis second left toe/cellulitis: ongoing intravenous antibiotic therapy with ceftriaxone and daptomycin  3. Hypertension: As above in #1 and 2- BP if anything is high- on steroids  5. Diabetes mellitus: Management per primary service.  6.  Dispo: pending improvement in breathing  7. Metabolic acidosis- on high dose bicarb- HD will correct   8. Anemia- hgb down-  iron stores low, will replete and given ESA     Subjective:    Still SOB - had breathing tx said is better after that- UOP only 450 but missed a shift.  Numbers worse yet   Objective:   BP (!) 171/65 (BP Location: Left Arm)   Pulse 74   Temp (!) 97.5 F (36.4 C) (Oral)   Resp 18   Ht 5\' 8"  (1.727 m)   Wt 111.9 kg (246 lb 11.1 oz)   SpO2 97%   BMI 37.51 kg/m   Intake/Output Summary (Last 24 hours) at 10/18/2017 1032 Last data  filed at 10/18/2017 0933 Gross per 24 hour  Intake 1020 ml  Output 950 ml  Net 70 ml   Weight change: 7.4 kg (16 lb 5 oz)  Physical Exam: Gen: sitting in bed, still a little SOB HEENT: +JVD CVS: RRR no m/r/g Resp: slightly increased WOB but lungs don't sound  coarse  Abd: soft, notender Ext: 1+ LE edema, RUE PICC in place MSK: L foot with L 2nd toe osteomyelitis   Imaging: No results found.  Labs: BMET Recent Labs  Lab 10/12/17 0449 10/13/17 0325 10/14/17 0345 10/16/17 0511 10/17/17 0426 10/18/17 0507  NA 133* 135 135 135 135 134*  K 4.3 4.8 4.5 4.6 5.4* 5.0  CL 103 106 107 105 104 102  CO2 14* 14* 13* 15* 15* 15*  GLUCOSE 81 92 67 100* 152* 212*  BUN 61* 65* 62* 67* 83* 103*  CREATININE 3.79* 3.50* 3.50* 3.65* 3.80* 3.98*  CALCIUM 7.3* 7.3* 7.7* 8.3* 8.7* 8.7*  PHOS 6.1* 6.0*  --   --   --   --    CBC Recent Labs  Lab 10/13/17 0325  WBC 15.3*  HGB 9.5*  HCT 29.9*  MCV 87.2  PLT 595*    Medications:    . aspirin EC  81 mg Oral Daily  . gabapentin  100 mg Oral Daily  . heparin  5,000 Units Subcutaneous  Q8H  . hydrALAZINE  50 mg Oral BID  . isosorbide mononitrate  120 mg Oral Daily  . labetalol  200 mg Oral BID  . sodium bicarbonate  1,300 mg Oral BID      Goldman Birchall A  10/18/2017, 10:32 AM

## 2017-10-18 NOTE — Progress Notes (Signed)
Physical Therapy Treatment Patient Details Name: Kristen Berry MRN: [id number] DOB: [birth year] Today's Date: 10/18/2017    History of Present Illness Patient is a 62 year old female who presented to the hospital with shortness of breath. She was found to have an acute kidney injury. PMH: PVD, HTN, CHF CKD, OA     PT Comments    Pt progressing towards physical therapy goals. She was able to tolerate increased functional activity this session, evidenced by the improved distance of 275 feet she was able to achieve. Pt took 3 rest breaks throughout gait training (2 seated, 1 standing), and was on RA throughout session (sats remained 95-97%). Overall, DOE appeared improved this session as well. Will continue to follow and progress as able per POC.   Follow Up Recommendations  Home health PT     Equipment Recommendations  Rolling walker with 5" wheels (depending on progress with PT - has a rollator, unsure whether she will need the stability of a RW by d/c)   Recommendations for Other Services       Precautions / Restrictions Precautions Precautions: Fall Restrictions Weight Bearing Restrictions: No    Mobility  Bed Mobility               General bed mobility comments: Pt was received sitting up in the chair  Transfers Overall transfer level: Needs assistance Equipment used: Rolling walker (2 wheeled) Transfers: Sit to/from Stand Sit to Stand: Min assist;Min guard         General transfer comment: Initially pt required several attempts and min assist for power-up to full stand. By end of session pt progressed to close guard for safety. Pt was cued for anterior translation and beginning transfer from edge of chair for ease of sit>stand.   Ambulation/Gait Ambulation/Gait assistance: Min guard Ambulation Distance (Feet): 275 Feet Assistive device: Rolling walker (2 wheeled) Gait Pattern/deviations: Step-through pattern Gait velocity: Decreased Gait velocity  interpretation: Below normal speed for age/gender General Gait Details: Slow and guarded with almost immediate DOE. Pt ambulated on RA and sats remained 95-97% throughout gait training. Pt was able to ambulate a farther distance before requiring a rest break this session, and gait speed was improved by end of gait training. She required 2 seated rest breaks and 1 standing rest break in which she was resting her forearms on the walker for support and to catch her breath. VC's for pursed-lip breathing throughout.    Stairs            Wheelchair Mobility    Modified Rankin (Stroke Patients Only)       Balance Overall balance assessment: Needs assistance Sitting-balance support: Feet supported;No upper extremity supported Sitting balance-Leahy Scale: Good     Standing balance support: Bilateral upper extremity supported Standing balance-Leahy Scale: Poor Standing balance comment: Relies on B UE support                            Cognition Arousal/Alertness: Awake/alert Behavior During Therapy: WFL for tasks assessed/performed(Slightly impulsive at end of session - rushing to sit down) Overall Cognitive Status: Within Functional Limits for tasks assessed                                        Exercises      General Comments        Pertinent Vitals/Pain  Pain Assessment: Faces Faces Pain Scale: Hurts a little bit Pain Location: L foot and B lower legs (osteomyelitis of toe on L and pt reports chronic bilateral lower leg chronic circulation issues) Pain Descriptors / Indicators: ("Cold pain") Pain Intervention(s): Limited activity within patient's tolerance;Monitored during session;Repositioned    Home Living                      Prior Function            PT Goals (current goals can now be found in the care plan section) Acute Rehab PT Goals Patient Stated Goal: to breathe better and get stronger PT Goal Formulation: With  patient Time For Goal Achievement: 10/22/17 Potential to Achieve Goals: Good Progress towards PT goals: Progressing toward goals    Frequency    Min 3X/week      PT Plan Current plan remains appropriate    Co-evaluation              AM-PAC PT "6 Clicks" Daily Activity  Outcome Measure  Difficulty turning over in bed (including adjusting bedclothes, sheets and blankets)?: None Difficulty moving from lying on back to sitting on the side of the bed? : A Little Difficulty sitting down on and standing up from a chair with arms (e.g., wheelchair, bedside commode, etc,.)?: A Little Help needed moving to and from a bed to chair (including a wheelchair)?: A Little Help needed walking in hospital room?: A Little Help needed climbing 3-5 steps with a railing? : A Little 6 Click Score: 19    End of Session Equipment Utilized During Treatment: Gait belt Activity Tolerance: Patient limited by fatigue(DOE) Patient left: in chair;with call bell/phone within reach Nurse Communication: Mobility status PT Visit Diagnosis: Unsteadiness on feet (R26.81);Difficulty in walking, not elsewhere classified (R26.2)     Time: 5498-2641 PT Time Calculation (min) (ACUTE ONLY): 28 min  Charges:  $Gait Training: 23-37 mins                    G Codes:       Kristen Berry, PT, DPT Acute Rehabilitation Services Pager: [phone]    Kristen Berry 10/18/2017, 3:03 PM

## 2017-10-19 ENCOUNTER — Encounter (HOSPITAL_COMMUNITY): Payer: Self-pay | Admitting: Interventional Radiology

## 2017-10-19 ENCOUNTER — Ambulatory Visit: Payer: Medicare PPO | Admitting: Sports Medicine

## 2017-10-19 ENCOUNTER — Inpatient Hospital Stay (HOSPITAL_COMMUNITY): Payer: Medicare HMO

## 2017-10-19 HISTORY — PX: IR FLUORO GUIDE CV LINE RIGHT: IMG2283

## 2017-10-19 HISTORY — PX: IR US GUIDE VASC ACCESS RIGHT: IMG2390

## 2017-10-19 LAB — PARATHYROID HORMONE, INTACT (NO CA): PTH: 273 pg/mL — ABNORMAL HIGH (ref 15–65)

## 2017-10-19 LAB — KAPPA/LAMBDA LIGHT CHAINS
Kappa free light chain: 126.7 mg/L — ABNORMAL HIGH (ref 3.3–19.4)
Kappa, lambda light chain ratio: 1.38 (ref 0.26–1.65)
Lambda free light chains: 91.5 mg/L — ABNORMAL HIGH (ref 5.7–26.3)

## 2017-10-19 LAB — RENAL FUNCTION PANEL
Albumin: 2.8 g/dL — ABNORMAL LOW (ref 3.5–5.0)
Anion gap: 16 — ABNORMAL HIGH (ref 5–15)
BUN: 121 mg/dL — ABNORMAL HIGH (ref 6–20)
CO2: 16 mmol/L — ABNORMAL LOW (ref 22–32)
Calcium: 8.1 mg/dL — ABNORMAL LOW (ref 8.9–10.3)
Chloride: 102 mmol/L (ref 101–111)
Creatinine, Ser: 4.47 mg/dL — ABNORMAL HIGH (ref 0.44–1.00)
GFR calc Af Amer: 11 mL/min — ABNORMAL LOW (ref >60)
GFR calc non Af Amer: 10 mL/min — ABNORMAL LOW (ref >60)
Glucose, Bld: 167 mg/dL — ABNORMAL HIGH (ref 65–99)
Phosphorus: 7.5 mg/dL — ABNORMAL HIGH (ref 2.5–4.6)
Potassium: 5 mmol/L (ref 3.5–5.1)
Sodium: 134 mmol/L — ABNORMAL LOW (ref 135–145)

## 2017-10-19 LAB — CBC
HCT: 28 % — ABNORMAL LOW (ref 36.0–46.0)
Hemoglobin: 9 g/dL — ABNORMAL LOW (ref 12.0–15.0)
MCH: 28 pg (ref 26.0–34.0)
MCHC: 32.1 g/dL (ref 30.0–36.0)
MCV: 87.2 fL (ref 78.0–100.0)
Platelets: 560 10*3/uL — ABNORMAL HIGH (ref 150–400)
RBC: 3.21 MIL/uL — ABNORMAL LOW (ref 3.87–5.11)
RDW: 18.4 % — ABNORMAL HIGH (ref 11.5–15.5)
WBC: 17.2 10*3/uL — ABNORMAL HIGH (ref 4.0–10.5)

## 2017-10-19 MED ORDER — CALCIUM ACETATE (PHOS BINDER) 667 MG PO CAPS
667.0000 mg | ORAL_CAPSULE | Freq: Three times a day (TID) | ORAL | Status: DC
  Administered 2017-10-19 – 2017-11-09 (×44): 667 mg via ORAL
  Filled 2017-10-19 (×55): qty 1

## 2017-10-19 MED ORDER — HEPARIN SODIUM (PORCINE) 1000 UNIT/ML IJ SOLN
INTRAMUSCULAR | Status: AC
Start: 2017-10-19 — End: 2017-10-20
  Filled 2017-10-19: qty 1

## 2017-10-19 MED ORDER — LIDOCAINE HCL 1 % IJ SOLN
INTRAMUSCULAR | Status: AC
  Filled 2017-10-19: qty 20

## 2017-10-19 MED ORDER — DARBEPOETIN ALFA 60 MCG/0.3ML IJ SOSY
60.0000 ug | PREFILLED_SYRINGE | INTRAMUSCULAR | Status: DC
  Filled 2017-10-19: qty 0.3

## 2017-10-19 MED ORDER — HEPARIN SODIUM (PORCINE) 5000 UNIT/ML IJ SOLN
5000.0000 [IU] | Freq: Three times a day (TID) | INTRAMUSCULAR | Status: DC
  Administered 2017-10-20: 5000 [IU] via SUBCUTANEOUS
  Filled 2017-10-19 (×2): qty 1

## 2017-10-19 MED ORDER — LIDOCAINE HCL 1 % IJ SOLN
INTRAMUSCULAR | Status: DC | PRN
  Administered 2017-10-19: 10 mL

## 2017-10-19 NOTE — Progress Notes (Signed)
OT Cancellation Note  Patient Details Name: Kristen Berry MRN: [id number] DOB: 10/29/1955   Cancelled Treatment:    Reason Eval/Treat Not Completed: Patient declined, no reason specified. Pt reports anxiety concerning upcoming procedure and politely declined to participate in OT session at this time. Will check back as able.  Kristen Herrlich, MS OTR/L  Pager: [phone]   Kristen Berry 10/19/2017, 12:08 PM

## 2017-10-19 NOTE — Progress Notes (Signed)
[facility] Progress Note    Assessment/ Plan:    1. Acute kidney injury on chronic kidney disease stage IV: Baseline kidney function not great. from the history suspicious for hemodynamically mediated acute kidney injury. UA was not reflective of post-infectious GN. Complements normal- held ARB- Avoided nephrotoxins. Limiting hypotensive episodes. Baseline cr appears to be 2.4- peaked over 4- then down to 3.5- but over last 4 days up now to 4.47 with BUN now over 100.  Lasix increased but UOP recorded is not responding. dont have clear cut reason for worsening last 3 days but baseline function certainly was marginal, now getting to danger range and uremic sxms- needed to talk yesterday about  dialysis- not sure if will need long term or short term. pt agreeable to proceed with PC and first HD today   2.  Acute diastolic CHF: Takes Lasix 40 mg PO at home.  Last CXR suggestive of pulmonary edema On lasix  IV here- not a robust response.  Holding Losartan, is on hydralazine and labetalol, BP is not low.  2. Osteomyelitis second left toe/cellulitis: ongoing intravenous antibiotic therapy with ceftriaxone and daptomycin  3. Hypertension: BP controlled now taht starting HD will likely not need as much BP meds- will stop labetalol- still on hydralazine   5. Diabetes mellitus: Management per primary service.  6.  Dispo: pending improvement in breathing  7. Metabolic acidosis- on high dose bicarb- HD will correct - will stop now that starting HD  8. Anemia- hgb down-  iron stores low, will replete and given ESA    9. Bones- phos high- start phoslo- PTH 273- no vit D as of yet   Subjective:    Still SOB -UOP 900 but  Numbers worse yet   Objective:   BP 139/60 (BP Location: Left Arm)   Pulse 60   Temp 97.6 F (36.4 C) (Axillary)   Resp 16   Ht 5\' 8"  (1.727 m)   Wt 111.9 kg (246 lb 11.1 oz)   SpO2 98%   BMI 37.51 kg/m   Intake/Output Summary (Last 24 hours) at 10/19/2017  0907 Last data filed at 10/19/2017 0546 Gross per 24 hour  Intake 1353.2 ml  Output 900 ml  Net 453.2 ml   Weight change:   Physical Exam: Gen: sitting in bed, still a little SOB HEENT: +JVD CVS: RRR no m/r/g Resp: slightly increased WOB but lungs don't sound  coarse  Abd: soft, notender Ext: 1+ LE edema, RUE PICC in place MSK: L foot with L 2nd toe osteomyelitis   Imaging: No results found.  Labs: BMET Recent Labs  Lab 10/13/17 0325 10/14/17 0345 10/16/17 0511 10/17/17 0426 10/18/17 0507 10/19/17 0433  NA 135 135 135 135 134* 134*  K 4.8 4.5 4.6 5.4* 5.0 5.0  CL 106 107 105 104 102 102  CO2 14* 13* 15* 15* 15* 16*  GLUCOSE 92 67 100* 152* 212* 167*  BUN 65* 62* 67* 83* 103* 121*  CREATININE 3.50* 3.50* 3.65* 3.80* 3.98* 4.47*  CALCIUM 7.3* 7.7* 8.3* 8.7* 8.7* 8.1*  PHOS 6.0*  --   --   --   --  7.5*   CBC Recent Labs  Lab 10/13/17 0325 10/19/17 0433  WBC 15.3* 17.2*  HGB 9.5* 9.0*  HCT 29.9* 28.0*  MCV 87.2 87.2  PLT 595* 560*    Medications:    . aspirin EC  81 mg Oral Daily  . furosemide  80 mg Intravenous Q12H  . gabapentin  100 mg Oral Daily  . [START ON 10/20/2017] heparin  5,000 Units Subcutaneous Q8H  . hydrALAZINE  50 mg Oral BID  . isosorbide mononitrate  120 mg Oral Daily  . labetalol  200 mg Oral BID  . sodium bicarbonate  1,300 mg Oral BID      Jocelyn Nold A  10/19/2017, 9:07 AM

## 2017-10-19 NOTE — Progress Notes (Signed)
PROGRESS NOTE    NOVELLA ABRAHA  YNW:[id number] DOB: [birth year] DOA: 10/10/2017 PCP: Martinique, Sarah T, MD   Brief Narrative: 62 year old female with history of peripheral vascular disease, chronic kidney disease stage IV with baseline creatinine around 2.4, recently diagnosed with left toe osteomyelitis at [facility] and discharged with IV daptomycin and ceftriaxone. Patient was receiving IV antibiotics daily at [facility] before transfer here. As per prior note, it was discussed with the surgery and no surgical procedure was recommended due to peripheral vascular disease. The plan was to follow-up with vascular and orthopedics as outpatient.  Patient was transferred to [facility] for worsening renal failure and for nephrology consult.    Placement of temporary dialysis catheter and initiation of hemodialysis on 1/23/219.  Assessment & Plan:  #Acute kidney injury on CKD stage IV: -Worsening renal failure with some uremic symptoms including generalized weakness and poor sleep.  Still having shortness of breath.  Not responding with IV Lasix.  Temporary catheter placed on 10/19/2017 and plan to start hemodialysis.  Monitor BMP, urine output and electrolytes.  Nephrology assistance appreciated.    UA has no protein and RBCs. Complements normal.  Renal ultrasound consistent with chronic kidney disease.   CK level normal, urine eos negative, kappa lambda ratio normal.  #Shortness of breath/acute pulmonary edema/acute systolic congestive heart failure: Chest x-ray with cardiomegaly with vascular congestion and edema.   -Elevated BNP.  starting hemodialysis treatment. -Echocardiogram with EF of 35-40%.  Cardiology consult appreciated, further evaluation per cardiology. -Continue aspirin, hydralazine, Imdur.  #History of mild intermittent asthma: Stable, no exacerbation.    #Left second toe osteomyelitis: As per prior progress note, she will follow-up with orthopedics  outpatient and plan for vascular procedures. She was recently discharged from [facility] with IV daptomycin and ceftriaxone. As per patient she was getting IV antibiotics daily at the hospital and the plan is to continue until November 11, 2017. I have discussed with the case manager.The [facility] is requesting the prescription for antibiotics to resume after discharge. I consulted pharmacist.  The resumption order for antibiotics prescribed on discharge.  I have discussed with the patient at bedside in detail regarding importance of following up with infectious disease vascular and orthopedics.  She verbalized understanding. -Ordered 2 sets of blood culture.  #Type 2 diabetes: Continue to monitor blood sugar level. A1c 5.5.  #Hypertension: Blood pressure elevated.  Starting hemodialysis.  Continue current antihypertensive medication.  #Physical deconditioning: PT OT evaluation.   DVT prophylaxis: Heparin subcutaneous Code Status: Full code Family Communication: No family at bedside.  Patient's friend present. Disposition Plan: Likely discharge home in 3-4 days.    Consultants:   Nephrology  Cardiology  Procedures: None Antimicrobials: On daptomycin and IV ceftriaxone since before admission  Subjective: Seen and examined at bedside.  Could not sleep last night as well.  Having shortness of breath.  No chest pain.  She has generalized weakness.  No nausea vomiting. Objective: Vitals:   10/18/17 2139 10/18/17 2200 10/19/17 0544 10/19/17 1319  BP: (!) 138/58  139/60 (!) 167/65  Pulse: 68  60 72  Resp: 18  16 18   Temp:  97.8 F (36.6 C) 97.6 F (36.4 C) 97.7 F (36.5 C)  TempSrc: Oral  Axillary Oral  SpO2: 98%  98% 95%  Weight:      Height:        Intake/Output Summary (Last 24 hours) at 10/19/2017 1339 Last data filed at 10/19/2017 0546 Gross per 24 hour  Intake 573.2 ml  Output 200 ml  Net 373.2 ml   Filed Weights   10/16/17 1745 10/17/17 0359  10/18/17 0528  Weight: 104.5 kg (230 lb 6.1 oz) 110.8 kg (244 lb 4.3 oz) 111.9 kg (246 lb 11.1 oz)    Examination:  General exam: Sitting in bed, fortable, not in distress Respiratory system: Bibasilar crackles, no wheezing, mild increased work of breathing. Cardiovascular system: Regular rate rhythm S1-S2 normal.  Trace lower extremity edema. Gastrointestinal system: Soft, nontender.  Nondistended.  Bowel sounds positive. Central nervous system: Alert awake and following commands. Extremities: dressing applied in left toe. Skin: No rashes, lesions or ulcers Psychiatry: Judgement and insight appear normal. Mood & affect appropriate.     Data Reviewed: I have personally reviewed following labs and imaging studies  CBC: Recent Labs  Lab 10/13/17 0325 10/19/17 0433  WBC 15.3* 17.2*  HGB 9.5* 9.0*  HCT 29.9* 28.0*  MCV 87.2 87.2  PLT 595* 932*   Basic Metabolic Panel: Recent Labs  Lab 10/13/17 0325 10/14/17 0345 10/16/17 0511 10/17/17 0426 10/18/17 0507 10/19/17 0433  NA 135 135 135 135 134* 134*  K 4.8 4.5 4.6 5.4* 5.0 5.0  CL 106 107 105 104 102 102  CO2 14* 13* 15* 15* 15* 16*  GLUCOSE 92 67 100* 152* 212* 167*  BUN 65* 62* 67* 83* 103* 121*  CREATININE 3.50* 3.50* 3.65* 3.80* 3.98* 4.47*  CALCIUM 7.3* 7.7* 8.3* 8.7* 8.7* 8.1*  PHOS 6.0*  --   --   --   --  7.5*   GFR: Estimated Creatinine Clearance: 17.1 mL/min (A) (by C-G formula based on SCr of 4.47 mg/dL (H)). Liver Function Tests: Recent Labs  Lab 10/13/17 0325 10/18/17 0507 10/19/17 0433  AST  --  13*  --   ALT  --  13*  --   ALKPHOS  --  75  --   BILITOT  --  0.5  --   PROT  --  7.0  --   ALBUMIN 2.8* 2.8* 2.8*   No results for input(s): LIPASE, AMYLASE in the last 168 hours. No results for input(s): AMMONIA in the last 168 hours. Coagulation Profile: No results for input(s): INR, PROTIME in the last 168 hours. Cardiac Enzymes: Recent Labs  Lab 10/13/17 0325 10/17/17 0426 10/18/17 0507    CKTOTAL 80 109 93   BNP (last 3 results) No results for input(s): PROBNP in the last 8760 hours. HbA1C: No results for input(s): HGBA1C in the last 72 hours. CBG: No results for input(s): GLUCAP in the last 168 hours. Lipid Profile: Recent Labs    10/18/17 0507  CHOL 184  HDL 39*  LDLCALC 109*  TRIG 180*  CHOLHDL 4.7   Thyroid Function Tests: No results for input(s): TSH, T4TOTAL, FREET4, T3FREE, THYROIDAB in the last 72 hours. Anemia Panel: Recent Labs    10/18/17 0507  FERRITIN 118  TIBC 209*  IRON 37   Sepsis Labs: No results for input(s): PROCALCITON, LATICACIDVEN in the last 168 hours.  No results found for this or any previous visit (from the past 240 hour(s)).       Radiology Studies: Ir Fluoro Guide Cv Line Right  Result Date: 10/19/2017 INDICATION: 62 year old female with acute kidney injury in need of hemodialysis. EXAM: IR RIGHT FLOURO GUIDE CV LINE; IR ULTRASOUND GUIDANCE VASC ACCESS RIGHT MEDICATIONS: None. ANESTHESIA/SEDATION: None FLUOROSCOPY TIME:  Fluoroscopy Time: 0 minutes 12 seconds (2 mGy). COMPLICATIONS: None immediate. PROCEDURE: Informed written consent was obtained from  the patient after a thorough discussion of the procedural risks, benefits and alternatives. All questions were addressed. Maximal Sterile Barrier Technique was utilized including caps, mask, sterile gowns, sterile gloves, sterile drape, hand hygiene and skin antiseptic. A timeout was performed prior to the initiation of the procedure. The right internal jugular vein was interrogated with ultrasound and found to be widely patent. An image was obtained and stored for the medical record. Local anesthesia was attained by infiltration with 1% lidocaine. A small dermatotomy was made. Under real-time sonographic guidance, the vessel was punctured with a 21 gauge micropuncture needle. Using standard technique, the initial micro needle was exchanged over a 0.018 micro wire for a transitional 4  [place] micro sheath. The micro sheath was then exchanged over a 0.035 in the skin tract was dilated to 12 [place]. A 20 cm Mahurkur Trialysis catheter was then advanced over the wire and position with the tip in the mid right atrium. The catheter flushes and aspirates with ease. It was flushed with heparinized saline and secured to the skin with 0 Prolene suture. A sterile bandage was applied. IMPRESSION: Successful placement of a 20 cm right IJ approach Trialysis catheter. The catheter tip is in the mid right atrium and ready for immediate use. Electronically Signed   By: Jacqulynn Cadet M.D.   On: 10/19/2017 13:03   Ir US Guide Vasc Access Right  Result Date: 10/19/2017 INDICATION: 62 year old female with acute kidney injury in need of hemodialysis. EXAM: IR RIGHT FLOURO GUIDE CV LINE; IR ULTRASOUND GUIDANCE VASC ACCESS RIGHT MEDICATIONS: None. ANESTHESIA/SEDATION: None FLUOROSCOPY TIME:  Fluoroscopy Time: 0 minutes 12 seconds (2 mGy). COMPLICATIONS: None immediate. PROCEDURE: Informed written consent was obtained from the patient after a thorough discussion of the procedural risks, benefits and alternatives. All questions were addressed. Maximal Sterile Barrier Technique was utilized including caps, mask, sterile gowns, sterile gloves, sterile drape, hand hygiene and skin antiseptic. A timeout was performed prior to the initiation of the procedure. The right internal jugular vein was interrogated with ultrasound and found to be widely patent. An image was obtained and stored for the medical record. Local anesthesia was attained by infiltration with 1% lidocaine. A small dermatotomy was made. Under real-time sonographic guidance, the vessel was punctured with a 21 gauge micropuncture needle. Using standard technique, the initial micro needle was exchanged over a 0.018 micro wire for a transitional 4 [place] micro sheath. The micro sheath was then exchanged over a 0.035 in the skin tract was dilated to 12  [place]. A 20 cm Mah[place]kur Trialysis catheter was then advanced over the wire and position with the tip in the mid right atrium. The catheter flushes and aspirates with ease. It was flushed with heparinized saline and secured to the skin with 0 Prolene suture. A sterile bandage was applied. IMPRESSION: Successful placement of a 20 cm right IJ approach Trialysis catheter. The catheter tip is in the mid right atrium and ready for immediate use. Electronically Signed   By: Jacqulynn Cadet M.D.   On: 10/19/2017 13:03        Scheduled Meds: . aspirin EC  81 mg Oral Daily  . calcium acetate  667 mg Oral TID WC  . [START ON 10/24/2017] darbepoetin (ARANESP) injection - DIALYSIS  60 mcg Intravenous Q Mon-HD  . furosemide  80 mg Intravenous Q12H  . gabapentin  100 mg Oral Daily  . heparin      . [START ON 10/20/2017] heparin  5,000 Units Subcutaneous Q8H  .  hydrALAZINE  50 mg Oral BID  . isosorbide mononitrate  120 mg Oral Daily  . lidocaine       Continuous Infusions: . cefTRIAXone (ROCEPHIN)  IV Stopped (10/18/17 1535)  . DAPTOmycin (CUBICIN)  IV Stopped (10/18/17 1437)  . ferric gluconate (FERRLECIT/NULECIT) IV Stopped (10/19/17 1101)     LOS: 9 days    Olla Delancey Tanna Furry, MD [facility] Pager [phone]  If 7PM-7AM, please contact night-coverage [email or web address] Password [email or web address] 10/19/2017, 1:39 PM

## 2017-10-19 NOTE — Progress Notes (Signed)
Patient discussed with Dr. Laurence Ferrari.  Given increasing WBC and current treatment for osteo with no blood cultures to show no evidence of bacteremia, we will plan to place a temp HD cath so she can start HD.  We would recommend blood cultures be obtained so if these are negative, remains AF, and her WBC starts trending down, then we can proceed with converting her temp cath to a tunneled cath.  This was all explained to the patient and she is agreeable with this plan.  Henreitta Cea 11:24 AM 10/19/2017

## 2017-10-19 NOTE — Procedures (Signed)
Interventional Radiology Procedure Note  Procedure: Placement of a 20cm right IJ non-tunneled Trialysis catheter.   Complications: None  Estimated Blood Loss: None  Recommendations: - Routine line care   Signed,  Criselda Peaches, MD

## 2017-10-19 NOTE — Progress Notes (Signed)
The patient has been seen in conjunction with Harlan Stains, NP-C. All aspects of care have been considered and discussed. The patient has been personally interviewed, examined, and all clinical data has been reviewed.   Agree with plans to dialyze.  This will help with blood pressure control and volume status.  She will need to have coronary angiography at some point to determine if CAD is the cause of diastolic heart failure.  Progress Note  Patient Name: Kristen Berry Date of Encounter: 10/19/2017  Primary Cardiologist: Tamala Julian ([place])  Subjective   Still short of breath this afternoon, no chest pain.   Inpatient Medications    Scheduled Meds: . aspirin EC  81 mg Oral Daily  . calcium acetate  667 mg Oral TID WC  . [START ON 10/24/2017] darbepoetin (ARANESP) injection - DIALYSIS  60 mcg Intravenous Q Mon-HD  . furosemide  80 mg Intravenous Q12H  . gabapentin  100 mg Oral Daily  . heparin      . [START ON 10/20/2017] heparin  5,000 Units Subcutaneous Q8H  . hydrALAZINE  50 mg Oral BID  . isosorbide mononitrate  120 mg Oral Daily  . lidocaine       Continuous Infusions: . cefTRIAXone (ROCEPHIN)  IV Stopped (10/18/17 1535)  . DAPTOmycin (CUBICIN)  IV Stopped (10/18/17 1437)  . ferric gluconate (FERRLECIT/NULECIT) IV Stopped (10/19/17 1101)   PRN Meds: acetaminophen **OR** acetaminophen, camphor-menthol **AND** hydrOXYzine, docusate sodium, feeding supplement (NEPRO CARB STEADY), ipratropium-albuterol, lidocaine, ondansetron **OR** ondansetron (ZOFRAN) IV, sodium chloride flush, sorbitol, zolpidem   Vital Signs    Vitals:   10/18/17 2139 10/18/17 2200 10/19/17 0544 10/19/17 1319  BP: (!) 138/58  139/60 (!) 167/65  Pulse: 68  60 72  Resp: 18  16 18   Temp:  97.8 F (36.6 C) 97.6 F (36.4 C) 97.7 F (36.5 C)  TempSrc: Oral  Axillary Oral  SpO2: 98%  98% 95%  Weight:      Height:        Intake/Output Summary (Last 24 hours) at 10/19/2017 1343 Last data filed at  10/19/2017 0546 Gross per 24 hour  Intake 573.2 ml  Output 200 ml  Net 373.2 ml   Filed Weights   10/16/17 1745 10/17/17 0359 10/18/17 0528  Weight: 230 lb 6.1 oz (104.5 kg) 244 lb 4.3 oz (110.8 kg) 246 lb 11.1 oz (111.9 kg)    Telemetry    N/a - Personally Reviewed  ECG    ST with TWI in anterolateral leads - Personally Reviewed  Physical Exam   General: Well developed, well nourished, obese W female appearing in no acute distress. Head: Normocephalic, atraumatic.  Neck: Supple without bruits, + JVD Lungs:  Resp regular and unlabored, diminished with rales in bases. Heart: RRR, S1, S2, no S3, S4, or murmur; no rub. Abdomen: Soft, non-tender, non-distended with normoactive bowel sounds. No hepatomegaly. No rebound/guarding. No obvious abdominal masses. Extremities: No clubbing, cyanosis, 1+ bilateral LE edema. Distal pedal pulses are 2+ bilaterally. Neuro: Alert and oriented X 3. Moves all extremities spontaneously. Psych: Normal affect.  Labs    Chemistry Recent Labs  Lab 10/13/17 0325  10/17/17 0426 10/18/17 0507 10/19/17 0433  NA 135   < > 135 134* 134*  K 4.8   < > 5.4* 5.0 5.0  CL 106   < > 104 102 102  CO2 14*   < > 15* 15* 16*  GLUCOSE 92   < > 152* 212* 167*  BUN 65*   < >  83* 103* 121*  CREATININE 3.50*   < > 3.80* 3.98* 4.47*  CALCIUM 7.3*   < > 8.7* 8.7* 8.1*  PROT  --   --   --  7.0  --   ALBUMIN 2.8*  --   --  2.8* 2.8*  AST  --   --   --  13*  --   ALT  --   --   --  13*  --   ALKPHOS  --   --   --  75  --   BILITOT  --   --   --  0.5  --   GFRNONAA 13*   < > 12* 11* 10*  GFRAA 15*   < > 14* 13* 11*  ANIONGAP 15   < > 16* 17* 16*   < > = values in this interval not displayed.     Hematology Recent Labs  Lab 10/13/17 0325 10/19/17 0433  WBC 15.3* 17.2*  RBC 3.43* 3.21*  HGB 9.5* 9.0*  HCT 29.9* 28.0*  MCV 87.2 87.2  MCH 27.7 28.0  MCHC 31.8 32.1  RDW 17.7* 18.4*  PLT 595* 560*    Cardiac EnzymesNo results for input(s): TROPONINI  in the last 168 hours. No results for input(s): TROPIPOC in the last 168 hours.   BNP Recent Labs  Lab 10/17/17 1911  BNP 2,204.7*     DDimer No results for input(s): DDIMER in the last 168 hours.    Radiology    Ir Cyndy Freeze Guide Cv Line Right  Result Date: 10/19/2017 INDICATION: 62 year old female with acute kidney injury in need of hemodialysis. EXAM: IR RIGHT FLOURO GUIDE CV LINE; IR ULTRASOUND GUIDANCE VASC ACCESS RIGHT MEDICATIONS: None. ANESTHESIA/SEDATION: None FLUOROSCOPY TIME:  Fluoroscopy Time: 0 minutes 12 seconds (2 mGy). COMPLICATIONS: None immediate. PROCEDURE: Informed written consent was obtained from the patient after a thorough discussion of the procedural risks, benefits and alternatives. All questions were addressed. Maximal Sterile Barrier Technique was utilized including caps, mask, sterile gowns, sterile gloves, sterile drape, hand hygiene and skin antiseptic. A timeout was performed prior to the initiation of the procedure. The right internal jugular vein was interrogated with ultrasound and found to be widely patent. An image was obtained and stored for the medical record. Local anesthesia was attained by infiltration with 1% lidocaine. A small dermatotomy was made. Under real-time sonographic guidance, the vessel was punctured with a 21 gauge micropuncture needle. Using standard technique, the initial micro needle was exchanged over a 0.018 micro wire for a transitional 4 [place] micro sheath. The micro sheath was then exchanged over a 0.035 in the skin tract was dilated to 12 [place]. A 20 cm Mahurkur Trialysis catheter was then advanced over the wire and position with the tip in the mid right atrium. The catheter flushes and aspirates with ease. It was flushed with heparinized saline and secured to the skin with 0 Prolene suture. A sterile bandage was applied. IMPRESSION: Successful placement of a 20 cm right IJ approach Trialysis catheter. The catheter tip is in the mid  right atrium and ready for immediate use. Electronically Signed   By: Jacqulynn Cadet M.D.   On: 10/19/2017 13:03   Ir US Guide Vasc Access Right  Result Date: 10/19/2017 INDICATION: 62 year old female with acute kidney injury in need of hemodialysis. EXAM: IR RIGHT FLOURO GUIDE CV LINE; IR ULTRASOUND GUIDANCE VASC ACCESS RIGHT MEDICATIONS: None. ANESTHESIA/SEDATION: None FLUOROSCOPY TIME:  Fluoroscopy Time: 0 minutes 12 seconds (2 mGy). COMPLICATIONS: None  immediate. PROCEDURE: Informed written consent was obtained from the patient after a thorough discussion of the procedural risks, benefits and alternatives. All questions were addressed. Maximal Sterile Barrier Technique was utilized including caps, mask, sterile gowns, sterile gloves, sterile drape, hand hygiene and skin antiseptic. A timeout was performed prior to the initiation of the procedure. The right internal jugular vein was interrogated with ultrasound and found to be widely patent. An image was obtained and stored for the medical record. Local anesthesia was attained by infiltration with 1% lidocaine. A small dermatotomy was made. Under real-time sonographic guidance, the vessel was punctured with a 21 gauge micropuncture needle. Using standard technique, the initial micro needle was exchanged over a 0.018 micro wire for a transitional 4 [place] micro sheath. The micro sheath was then exchanged over a 0.035 in the skin tract was dilated to 12 [place]. A 20 cm Mahurkur Trialysis catheter was then advanced over the wire and position with the tip in the mid right atrium. The catheter flushes and aspirates with ease. It was flushed with heparinized saline and secured to the skin with 0 Prolene suture. A sterile bandage was applied. IMPRESSION: Successful placement of a 20 cm right IJ approach Trialysis catheter. The catheter tip is in the mid right atrium and ready for immediate use. Electronically Signed   By: Jacqulynn Cadet M.D.   On: 10/19/2017  13:03    Cardiac Studies   TTE: 10/17/17  Study Conclusions  - Left ventricle: Septal and apical akinesis. The cavity size was   moderately dilated. Wall thickness was increased in a pattern of   mild LVH. Systolic function was moderately reduced. The estimated   ejection fraction was in the range of 35% to 40%. Doppler   parameters are consistent with both elevated ventricular   end-diastolic filling pressure and elevated left atrial filling   pressure. - Mitral valve: Moderately calcified, moderately thickened annulus.   Moderately thickened leaflets . The findings are consistent with   mild stenosis. There was moderate regurgitation. Valve area by   continuity equation (using LVOT flow): 1.5 cm^2. - Left atrium: The atrium was mildly dilated. - Atrial septum: No defect or patent foramen ovale was identified. - Pulmonary arteries: PA peak pressure: 55 mm Hg (S).  Patient Profile     62 y.o. female with PMH of systolic HF, HTN, HL, CKD IV, osteomyelitis and PVD who presented with worsening renal function. Developed acute respiratory distress and acute renal failure.   Assessment & Plan    1. Acute on Chronic systolic HF: Lasix was held on admission 2/2 worsening renal function. Then developed acute dyspnea and restarted on oral--> IV. CXR with edema 1/19 Reported had a EF of 45% in the past, but echo this admission shows a drop in EF to 35% with septal and apical hypokinesis. Attempts at IV diuresis were unsuccessful and lead to worsening Cr, now at 4.4.  -- Had HD cath placed and planned for HD today -- consider switching labetalol to coreg given fall in EF, but has been held today in the setting of plans for first HD treatment.  -- now with plans for HD will need to consider whether invasive intervention warranted over the next few days. Also dependent on respiratory status. Will review with MD.   2. CKD IV: Followed by Dr. Justin Mend as outpatient. Cr has continued to rise. HD temp  cath placed today with plans for first HD.   3. Osteomyelitis: On IV antibiotics per primary  4. HTN: antihypertensives held with plans for HD.   5. Hx of DM: Was on metformin in the past, but Hgb A1c improved per her report. Hgb A1c 5.5 here.   6. Anemia: 10>>9.8>>9.5>>9.0. Iron studies essentially normal. Suspect 2/2 to renal disease.  7. HL: LDL 109 is above goal. Hx of statin intolerance.  -- consider retrial of statin   Would recommend transferring to telemetry unit for closer monitoring.   Signed, Reino Bellis, NP  10/19/2017, 1:43 PM  Pager # [phone]   For questions or updates, please contact [facility] Please consult [email or web address] for contact info under Cardiology/STEMI.

## 2017-10-20 LAB — HEPATITIS B SURFACE ANTIGEN: Hepatitis B Surface Ag: NEGATIVE

## 2017-10-20 LAB — RENAL FUNCTION PANEL
Albumin: 2.7 g/dL — ABNORMAL LOW (ref 3.5–5.0)
Anion gap: 17 — ABNORMAL HIGH (ref 5–15)
BUN: 151 mg/dL — ABNORMAL HIGH (ref 6–20)
CO2: 15 mmol/L — ABNORMAL LOW (ref 22–32)
Calcium: 7.6 mg/dL — ABNORMAL LOW (ref 8.9–10.3)
Chloride: 101 mmol/L (ref 101–111)
Creatinine, Ser: 4.36 mg/dL — ABNORMAL HIGH (ref 0.44–1.00)
GFR calc Af Amer: 12 mL/min — ABNORMAL LOW (ref >60)
GFR calc non Af Amer: 10 mL/min — ABNORMAL LOW (ref >60)
Glucose, Bld: 140 mg/dL — ABNORMAL HIGH (ref 65–99)
Phosphorus: 6.1 mg/dL — ABNORMAL HIGH (ref 2.5–4.6)
Potassium: 4.4 mmol/L (ref 3.5–5.1)
Sodium: 133 mmol/L — ABNORMAL LOW (ref 135–145)

## 2017-10-20 LAB — CBC
HCT: 26.7 % — ABNORMAL LOW (ref 36.0–46.0)
Hemoglobin: 8.4 g/dL — ABNORMAL LOW (ref 12.0–15.0)
MCH: 27.3 pg (ref 26.0–34.0)
MCHC: 31.5 g/dL (ref 30.0–36.0)
MCV: 86.7 fL (ref 78.0–100.0)
Platelets: 446 10*3/uL — ABNORMAL HIGH (ref 150–400)
RBC: 3.08 MIL/uL — ABNORMAL LOW (ref 3.87–5.11)
RDW: 18.2 % — ABNORMAL HIGH (ref 11.5–15.5)
WBC: 19.4 10*3/uL — ABNORMAL HIGH (ref 4.0–10.5)

## 2017-10-20 LAB — CK: Total CK: 195 U/L (ref 38–234)

## 2017-10-20 MED ORDER — PENTAFLUOROPROP-TETRAFLUOROETH EX AERO: 1.0000 "application " | INHALATION_SPRAY | CUTANEOUS | Status: DC | PRN

## 2017-10-20 MED ORDER — DARBEPOETIN ALFA 60 MCG/0.3ML IJ SOSY
PREFILLED_SYRINGE | INTRAMUSCULAR | Status: AC
  Filled 2017-10-20: qty 0.3

## 2017-10-20 MED ORDER — SODIUM CHLORIDE 0.9 % IV SOLN: 100.0000 mL | INTRAVENOUS | Status: DC | PRN

## 2017-10-20 MED ORDER — ALTEPLASE 2 MG IJ SOLR: 2.0000 mg | Freq: Once | INTRAMUSCULAR | Status: DC | PRN

## 2017-10-20 MED ORDER — SODIUM CHLORIDE 0.9 % IV SOLN
680.0000 mg | INTRAVENOUS | Status: DC
  Administered 2017-10-20 – 2017-10-26 (×4): 680 mg via INTRAVENOUS
  Filled 2017-10-20 (×4): qty 13.6

## 2017-10-20 MED ORDER — HEPARIN SODIUM (PORCINE) 1000 UNIT/ML DIALYSIS: 1000.0000 [IU] | INTRAMUSCULAR | Status: DC | PRN

## 2017-10-20 MED ORDER — LIDOCAINE-PRILOCAINE 2.5-2.5 % EX CREA: 1.0000 "application " | TOPICAL_CREAM | CUTANEOUS | Status: DC | PRN

## 2017-10-20 MED ORDER — LIDOCAINE HCL (PF) 1 % IJ SOLN: 5.0000 mL | INTRAMUSCULAR | Status: DC | PRN

## 2017-10-20 NOTE — Progress Notes (Signed)
PROGRESS NOTE    Kristen Berry  JQB:[id number] DOB: 25-Apr-1956 DOA: 10/10/2017 PCP: Martinique, Sarah T, MD   Brief Narrative: 62 year old female with history of peripheral vascular disease, chronic kidney disease stage IV with baseline creatinine around 2.4, recently diagnosed with left toe osteomyelitis at [facility] and discharged with IV daptomycin and ceftriaxone. Patient was receiving IV antibiotics daily at [facility] before transfer here. As per prior note, it was discussed with the surgery and no surgical procedure was recommended due to peripheral vascular disease. The plan was to follow-up with vascular and orthopedics as outpatient.  Patient was transferred to [facility] for worsening renal failure and for nephrology consult.    Placement of temporary dialysis catheter and initiation of hemodialysis on 1/23/219.  Assessment & Plan:  #Acute kidney injury on CKD stage IV: -Worsening renal failure with some uremic symptoms including generalized weakness and poor sleep.  Still having shortness of breath.  Not responding with IV Lasix.  Temporary catheter placed on 10/19/2017 and started dialysis today.  She has been tolerating well. -Follow-up blood culture result, if negative consider placement of tunneled catheter.   UA has no protein and RBCs. Complements normal.  Renal ultrasound consistent with chronic kidney disease.   CK level normal, urine eos negative, kappa lambda ratio normal.  #Anemia of chronic kidney disease: Continue IV iron and ESA during dialysis.  #Shortness of breath/acute pulmonary edema/acute systolic congestive heart failure: Chest x-ray with cardiomegaly with vascular congestion and edema.   -Elevated BNP.  starting hemodialysis treatment. -Echocardiogram with EF of 35-40%.  Cardiology consult appreciated, further evaluation per cardiology. -Continue aspirin, hydralazine, Imdur.  Discontinue Lasix since patient is on  hemodialysis.  #History of mild intermittent asthma: Stable, no exacerbation.    #Left second toe osteomyelitis: As per prior progress note, she will follow-up with orthopedics outpatient and plan for vascular procedures. She was recently discharged from [facility] with IV daptomycin and ceftriaxone. As per patient she was getting IV antibiotics daily at the hospital and the plan is to continue until November 11, 2017. I have discussed with the case manager.The [facility] is requesting the prescription for antibiotics to resume after discharge. I consulted pharmacist.  The resumption order for antibiotics prescribed on discharge.  I have discussed with the patient at bedside in detail regarding importance of following up with infectious disease vascular and orthopedics.  She verbalized understanding. -Ordered 2 sets of blood culture.   #Type 2 diabetes: Continue to monitor blood sugar level. A1c 5.5.  #Hypertension: Blood pressure elevated due to volume.  Expected to improve with hemodialysis.  Discontinue Lasix.  Continue hydralazine and Imdur.  #Physical deconditioning: PT OT evaluation.   DVT prophylaxis: Heparin subcutaneous Code Status: Full code Family Communication: No family at bedside.  Patient's friend present. Disposition Plan: Likely discharge home in 3-4 days.    Consultants:   Nephrology  Cardiology  Procedures: None Antimicrobials: On daptomycin and IV ceftriaxone since before admission  Subjective: Seen and examined at dialysis unit.  Having shortness of breath.  Denied chest pain, nausea vomiting headache.  Receiving first dialysis today. Objective: Vitals:   10/20/17 0900 10/20/17 0908 10/20/17 0913 10/20/17 1000  BP: (!) 169/70 (!) 169/70 (!) 163/52 (!) 143/57  Pulse: 92 92 85 93  Resp: 15 16 15 18   Temp:  97.9 F (36.6 C)  99 F (37.2 C)  TempSrc:  Oral  Oral  SpO2: 95% 96% 95% 95%  Weight:  113.5 kg (250 lb  3.6 oz)    Height:         Intake/Output Summary (Last 24 hours) at 10/20/2017 1141 Last data filed at 10/20/2017 0908 Gross per 24 hour  Intake 120 ml  Output 1700 ml  Net -1580 ml   Filed Weights   10/19/17 2307 10/20/17 0704 10/20/17 0908  Weight: 117 kg (257 lb 15 oz) 114.2 kg (251 lb 12.3 oz) 113.5 kg (250 lb 3.6 oz)    Examination:  General exam: Not in distress, receiving hemodialysis. Respiratory system: Bibasilar crackles and decreased breath sound, no wheezing Cardiovascular system: Regular rate rhythm S1-S2 normal.  Trace lower extremity edema. Gastrointestinal system: Soft, nontender.  Nondistended.  Bowel sounds positive. Central nervous system: Alert awake and following commands. Extremities: dressing applied in left toe. Skin: No rashes, lesions or ulcers Psychiatry: Judgement and insight appear normal. Mood & affect appropriate.  Temporary catheter site clean with no discharge noticed.   Data Reviewed: I have personally reviewed following labs and imaging studies  CBC: Recent Labs  Lab 10/19/17 0433 10/20/17 0352  WBC 17.2* 19.4*  HGB 9.0* 8.4*  HCT 28.0* 26.7*  MCV 87.2 86.7  PLT 560* 409*   Basic Metabolic Panel: Recent Labs  Lab 10/16/17 0511 10/17/17 0426 10/18/17 0507 10/19/17 0433 10/20/17 0352  NA 135 135 134* 134* 133*  K 4.6 5.4* 5.0 5.0 4.4  CL 105 104 102 102 101  CO2 15* 15* 15* 16* 15*  GLUCOSE 100* 152* 212* 167* 140*  BUN 67* 83* 103* 121* 151*  CREATININE 3.65* 3.80* 3.98* 4.47* 4.36*  CALCIUM 8.3* 8.7* 8.7* 8.1* 7.6*  PHOS  --   --   --  7.5* 6.1*   GFR: Estimated Creatinine Clearance: 17.7 mL/min (A) (by C-G formula based on SCr of 4.36 mg/dL (H)). Liver Function Tests: Recent Labs  Lab 10/18/17 0507 10/19/17 0433 10/20/17 0352  AST 13*  --   --   ALT 13*  --   --   ALKPHOS 75  --   --   BILITOT 0.5  --   --   PROT 7.0  --   --   ALBUMIN 2.8* 2.8* 2.7*   No results for input(s): LIPASE, AMYLASE in the last 168 hours. No results for  input(s): AMMONIA in the last 168 hours. Coagulation Profile: No results for input(s): INR, PROTIME in the last 168 hours. Cardiac Enzymes: Recent Labs  Lab 10/17/17 0426 10/18/17 0507 10/20/17 0352  CKTOTAL 109 93 195   BNP (last 3 results) No results for input(s): PROBNP in the last 8760 hours. HbA1C: No results for input(s): HGBA1C in the last 72 hours. CBG: No results for input(s): GLUCAP in the last 168 hours. Lipid Profile: Recent Labs    10/18/17 0507  CHOL 184  HDL 39*  LDLCALC 109*  TRIG 180*  CHOLHDL 4.7   Thyroid Function Tests: No results for input(s): TSH, T4TOTAL, FREET4, T3FREE, THYROIDAB in the last 72 hours. Anemia Panel: Recent Labs    10/18/17 0507  FERRITIN 118  TIBC 209*  IRON 37   Sepsis Labs: No results for input(s): PROCALCITON, LATICACIDVEN in the last 168 hours.  Recent Results (from the past 240 hour(s))  Culture, blood (routine x 2)     Status: None (Preliminary result)   Collection Time: 10/19/17  2:56 PM  Result Value Ref Range Status   Specimen Description BLOOD LEFT ANTECUBITAL  Final   Special Requests   Final    BOTTLES DRAWN AEROBIC AND ANAEROBIC  Blood Culture results may not be optimal due to an inadequate volume of blood received in culture bottles   Culture NO GROWTH < 24 HOURS  Final   Report Status PENDING  Incomplete  Culture, blood (routine x 2)     Status: None (Preliminary result)   Collection Time: 10/19/17  3:00 PM  Result Value Ref Range Status   Specimen Description BLOOD LEFT HAND  Final   Special Requests   Final    BOTTLES DRAWN AEROBIC AND ANAEROBIC Blood Culture adequate volume   Culture NO GROWTH < 24 HOURS  Final   Report Status PENDING  Incomplete         Radiology Studies: Ir Fluoro Guide Cv Line Right  Result Date: 10/19/2017 INDICATION: 62 year old female with acute kidney injury in need of hemodialysis. EXAM: IR RIGHT FLOURO GUIDE CV LINE; IR ULTRASOUND GUIDANCE VASC ACCESS RIGHT MEDICATIONS:  None. ANESTHESIA/SEDATION: None FLUOROSCOPY TIME:  Fluoroscopy Time: 0 minutes 12 seconds (2 mGy). COMPLICATIONS: None immediate. PROCEDURE: Informed written consent was obtained from the patient after a thorough discussion of the procedural risks, benefits and alternatives. All questions were addressed. Maximal Sterile Barrier Technique was utilized including caps, mask, sterile gowns, sterile gloves, sterile drape, hand hygiene and skin antiseptic. A timeout was performed prior to the initiation of the procedure. The right internal jugular vein was interrogated with ultrasound and found to be widely patent. An image was obtained and stored for the medical record. Local anesthesia was attained by infiltration with 1% lidocaine. A small dermatotomy was made. Under real-time sonographic guidance, the vessel was punctured with a 21 gauge micropuncture needle. Using standard technique, the initial micro needle was exchanged over a 0.018 micro wire for a transitional 4 [place] micro sheath. The micro sheath was then exchanged over a 0.035 in the skin tract was dilated to 12 [place]. A 20 cm Mahurkur Trialysis catheter was then advanced over the wire and position with the tip in the mid right atrium. The catheter flushes and aspirates with ease. It was flushed with heparinized saline and secured to the skin with 0 Prolene suture. A sterile bandage was applied. IMPRESSION: Successful placement of a 20 cm right IJ approach Trialysis catheter. The catheter tip is in the mid right atrium and ready for immediate use. Electronically Signed   By: Jacqulynn Cadet M.D.   On: 10/19/2017 13:03   Ir US Guide Vasc Access Right  Result Date: 10/19/2017 INDICATION: 63 year old female with acute kidney injury in need of hemodialysis. EXAM: IR RIGHT FLOURO GUIDE CV LINE; IR ULTRASOUND GUIDANCE VASC ACCESS RIGHT MEDICATIONS: None. ANESTHESIA/SEDATION: None FLUOROSCOPY TIME:  Fluoroscopy Time: 0 minutes 12 seconds (2 mGy).  COMPLICATIONS: None immediate. PROCEDURE: Informed written consent was obtained from the patient after a thorough discussion of the procedural risks, benefits and alternatives. All questions were addressed. Maximal Sterile Barrier Technique was utilized including caps, mask, sterile gowns, sterile gloves, sterile drape, hand hygiene and skin antiseptic. A timeout was performed prior to the initiation of the procedure. The right internal jugular vein was interrogated with ultrasound and found to be widely patent. An image was obtained and stored for the medical record. Local anesthesia was attained by infiltration with 1% lidocaine. A small dermatotomy was made. Under real-time sonographic guidance, the vessel was punctured with a 21 gauge micropuncture needle. Using standard technique, the initial micro needle was exchanged over a 0.018 micro wire for a transitional 4 [place] micro sheath. The micro sheath was then exchanged over a 0.035  in the skin tract was dilated to 12 [place]. A 20 cm Mahurkur Trialysis catheter was then advanced over the wire and position with the tip in the mid right atrium. The catheter flushes and aspirates with ease. It was flushed with heparinized saline and secured to the skin with 0 Prolene suture. A sterile bandage was applied. IMPRESSION: Successful placement of a 20 cm right IJ approach Trialysis catheter. The catheter tip is in the mid right atrium and ready for immediate use. Electronically Signed   By: Jacqulynn Cadet M.D.   On: 10/19/2017 13:03        Scheduled Meds: . aspirin EC  81 mg Oral Daily  . calcium acetate  667 mg Oral TID WC  . [START ON 10/24/2017] darbepoetin (ARANESP) injection - DIALYSIS  60 mcg Intravenous Q Mon-HD  . gabapentin  100 mg Oral Daily  . heparin  5,000 Units Subcutaneous Q8H  . hydrALAZINE  50 mg Oral BID  . isosorbide mononitrate  120 mg Oral Daily   Continuous Infusions: . cefTRIAXone (ROCEPHIN)  IV Stopped (10/19/17 1613)  .  DAPTOmycin (CUBICIN)  IV    . ferric gluconate (FERRLECIT/NULECIT) IV 125 mg (10/20/17 1028)     LOS: 10 days    Dron Tanna Furry, MD [facility] Pager [phone]  If 7PM-7AM, please contact night-coverage [email or web address] Password [facility] 10/20/2017, 11:41 AM

## 2017-10-20 NOTE — Progress Notes (Signed)
Pharmacist Heart Failure Core Measure Documentation  Assessment: Kristen Berry has an EF documented as 35-40% on 10/17/2017 by TTE. Her BNP on 1/21 was ~2,000.  Rationale: Heart failure patients with left ventricular systolic dysfunction (LVSD) and an EF < 40% should be prescribed an angiotensin converting enzyme inhibitor (ACEI) or angiotensin receptor blocker (ARB) at discharge unless a contraindication is documented in the medical record.  This patient is not currently on an ACEI or ARB for HF.  This note is being placed in the record in order to provide documentation that a contraindication to the use of these agents is present for this encounter.  ACE Inhibitor or Angiotensin Receptor Blocker is contraindicated (specify all that apply)  []   ACEI allergy AND ARB allergy []   Angioedema []   Moderate or severe aortic stenosis []   Hyperkalemia []   Hypotension []   Renal artery stenosis [x]   Worsening renal function, preexisting renal disease or dysfunction  Patterson Hammersmith PharmD PGY1 Pharmacy Practice Resident 10/20/2017 2:10 PM Pager: [phone]

## 2017-10-20 NOTE — Progress Notes (Signed)
[facility] Progress Note    Assessment/ Plan:    1. Acute kidney injury on chronic kidney disease stage IV: Baseline kidney function not great. from the history suspicious for hemodynamically mediated acute kidney injury. UA was not reflective of post-infectious GN. Complements normal- held ARB- Avoided nephrotoxins. Limiting hypotensive episodes. Baseline cr appears to be 2.4- peaked over 4- then down to 3.5- but over last 4 days up now over 4 again with BUN now over 150.  Lasix increased but UOP recorded is not responding. dont have clear cut reason for worsening last 3 days but baseline function certainly was marginal, now getting to danger range and uremic sxms- needed to talk about dialysis- not sure if will need long term or short term. pt agreeable to proceed with PC and first HD today- got bumped, next tx tomorrow   2.  Acute diastolic CHF: Takes Lasix 40 mg PO at home.  Last CXR suggestive of pulmonary edema On lasix  IV here- not a robust response.  Holding Losartan, is on hydralazine and labetalol, BP is not low- I think will decrease more with more UF.  2. Osteomyelitis second left toe/cellulitis: ongoing intravenous antibiotic therapy with ceftriaxone and daptomycin  3. Hypertension: BP controlled now that starting HD will likely not need as much BP meds- have stopped labetalol- still on hydralazine   5. Diabetes mellitus: Management per primary service.  6.  Dispo: pending improvement in breathing  7. Metabolic acidosis- on high dose bicarb- HD will correct - will stop now that starting HD  8. Anemia- hgb down-  iron stores low, will replete and given ESA    9. Bones- phos high- start phoslo- PTH 273- no vit D as of yet   Subjective:    Still SOB -UOP 900 but  Numbers worse yet   Objective:   BP (!) 173/63   Pulse 83   Temp 97.7 F (36.5 C) (Oral)   Resp 17   Ht 5\' 8"  (1.727 m)   Wt 114.2 kg (251 lb 12.3 oz)   SpO2 95%   BMI 38.28 kg/m    Intake/Output Summary (Last 24 hours) at 10/20/2017 8502 Last data filed at 10/20/2017 0606 Gross per 24 hour  Intake 120 ml  Output 400 ml  Net -280 ml   Weight change:   Physical Exam: Gen: sitting in bed, still a little SOB- seen on HD HEENT: +JVD CVS: RRR no m/r/g Resp: slightly increased WOB but lungs don't sound  coarse  Abd: soft, notender Ext: 1+ LE edema, RUE PICC in place- new right sided non tunneled HD cath placed 1/23 MSK: L foot with L 2nd toe osteomyelitis   Imaging: Ir Fluoro Guide Cv Line Right  Result Date: 10/19/2017 INDICATION: 62 year old female with acute kidney injury in need of hemodialysis. EXAM: IR RIGHT FLOURO GUIDE CV LINE; IR ULTRASOUND GUIDANCE VASC ACCESS RIGHT MEDICATIONS: None. ANESTHESIA/SEDATION: None FLUOROSCOPY TIME:  Fluoroscopy Time: 0 minutes 12 seconds (2 mGy). COMPLICATIONS: None immediate. PROCEDURE: Informed written consent was obtained from the patient after a thorough discussion of the procedural risks, benefits and alternatives. All questions were addressed. Maximal Sterile Barrier Technique was utilized including caps, mask, sterile gowns, sterile gloves, sterile drape, hand hygiene and skin antiseptic. A timeout was performed prior to the initiation of the procedure. The right internal jugular vein was interrogated with ultrasound and found to be widely patent. An image was obtained and stored for the medical record. Local anesthesia was attained by infiltration  with 1% lidocaine. A small dermatotomy was made. Under real-time sonographic guidance, the vessel was punctured with a 21 gauge micropuncture needle. Using standard technique, the initial micro needle was exchanged over a 0.018 micro wire for a transitional 4 [place] micro sheath. The micro sheath was then exchanged over a 0.035 in the skin tract was dilated to 12 [place]. A 20 cm Mahurkur Trialysis catheter was then advanced over the wire and position with the tip in the mid right  atrium. The catheter flushes and aspirates with ease. It was flushed with heparinized saline and secured to the skin with 0 Prolene suture. A sterile bandage was applied. IMPRESSION: Successful placement of a 20 cm right IJ approach Trialysis catheter. The catheter tip is in the mid right atrium and ready for immediate use. Electronically Signed   By: Jacqulynn Cadet M.D.   On: 10/19/2017 13:03   Ir US Guide Vasc Access Right  Result Date: 10/19/2017 INDICATION: 62 year old female with acute kidney injury in need of hemodialysis. EXAM: IR RIGHT FLOURO GUIDE CV LINE; IR ULTRASOUND GUIDANCE VASC ACCESS RIGHT MEDICATIONS: None. ANESTHESIA/SEDATION: None FLUOROSCOPY TIME:  Fluoroscopy Time: 0 minutes 12 seconds (2 mGy). COMPLICATIONS: None immediate. PROCEDURE: Informed written consent was obtained from the patient after a thorough discussion of the procedural risks, benefits and alternatives. All questions were addressed. Maximal Sterile Barrier Technique was utilized including caps, mask, sterile gowns, sterile gloves, sterile drape, hand hygiene and skin antiseptic. A timeout was performed prior to the initiation of the procedure. The right internal jugular vein was interrogated with ultrasound and found to be widely patent. An image was obtained and stored for the medical record. Local anesthesia was attained by infiltration with 1% lidocaine. A small dermatotomy was made. Under real-time sonographic guidance, the vessel was punctured with a 21 gauge micropuncture needle. Using standard technique, the initial micro needle was exchanged over a 0.018 micro wire for a transitional 4 [place] micro sheath. The micro sheath was then exchanged over a 0.035 in the skin tract was dilated to 12 [place]. A 20 cm Mahurkur Trialysis catheter was then advanced over the wire and position with the tip in the mid right atrium. The catheter flushes and aspirates with ease. It was flushed with heparinized saline and secured to the  skin with 0 Prolene suture. A sterile bandage was applied. IMPRESSION: Successful placement of a 20 cm right IJ approach Trialysis catheter. The catheter tip is in the mid right atrium and ready for immediate use. Electronically Signed   By: Jacqulynn Cadet M.D.   On: 10/19/2017 13:03    Labs: BMET Recent Labs  Lab 10/14/17 0345 10/16/17 0511 10/17/17 0426 10/18/17 0507 10/19/17 0433 10/20/17 0352  NA 135 135 135 134* 134* 133*  K 4.5 4.6 5.4* 5.0 5.0 4.4  CL 107 105 104 102 102 101  CO2 13* 15* 15* 15* 16* 15*  GLUCOSE 67 100* 152* 212* 167* 140*  BUN 62* 67* 83* 103* 121* 151*  CREATININE 3.50* 3.65* 3.80* 3.98* 4.47* 4.36*  CALCIUM 7.7* 8.3* 8.7* 8.7* 8.1* 7.6*  PHOS  --   --   --   --  7.5* 6.1*   CBC Recent Labs  Lab 10/19/17 0433 10/20/17 0352  WBC 17.2* 19.4*  HGB 9.0* 8.4*  HCT 28.0* 26.7*  MCV 87.2 86.7  PLT 560* 446*    Medications:    . aspirin EC  81 mg Oral Daily  . calcium acetate  667 mg Oral TID WC  . [  START ON 10/24/2017] darbepoetin (ARANESP) injection - DIALYSIS  60 mcg Intravenous Q Mon-HD  . furosemide  80 mg Intravenous Q12H  . gabapentin  100 mg Oral Daily  . heparin  5,000 Units Subcutaneous Q8H  . hydrALAZINE  50 mg Oral BID  . isosorbide mononitrate  120 mg Oral Daily      Terrika Zuver A  10/20/2017, 8:35 AM

## 2017-10-20 NOTE — Progress Notes (Signed)
Cardiology Progress Note  On dialysis.  No new suggestions.  Eventual coronary angiography when clinically stable.

## 2017-10-20 NOTE — Progress Notes (Signed)
Hemodialysis- First treatment initiated via R HD cath without issue. Hepatitis labs sent per protocol. Patient short of breath when speaking. Sats stable on 2L [place] currently. Consents signed and all questions answered to patient satisfaction. Labs and orders reviewed. Continue to monitor closely.

## 2017-10-20 NOTE — Progress Notes (Signed)
Notified Dr. Katheran James of the large purple discoloration of entire abd with small knot on left side. I held Heparin at this time. Will contiinue to monitor

## 2017-10-20 NOTE — Progress Notes (Signed)
Pharmacy Antibiotic Note  Kristen Berry is a 62 y.o. female admitted on 10/10/2017 with worsening renal function.  Of note, patient was admitted to [facility] earlier this month and was discharged home on Cubicin and Rocephin for osteomyelitis of the left 2nd toe.  Per patient, she has been going to [place] daily to get her IV antibiotics and has missed her dose on 10/09/17 due to the ice/snow.  Pharmacy has been consulted for Cubicin dosing.  Patient has baseline CKD4 and her renal function is worsening. She has not been responding well to IV Lasix, and went for an HD session this morning- she has tolerated BFR of 235mL/min so far.  She last received Cubicin on 1/22 @ 1400. She is afebrile but her WBC is trending up (steroids were stopped 1/21).  Patient's admit weight was 106kg, now up to 114kg. CK is WNL but also trending up.   Plan: Increase Cubicin to 680mg  IV Q48H (~6mg /kgTBW) due to increase in weight- move to be given at 1800 to accommodate HD Continue Ceftriaxone 2g IV Q24H  Monitor renal fxn vs HD schedule/tolerance, clinical progress, changes to weight CK at least weekly- will recheck again on Monday with acute rise over past 48h   Height: 5\' 8"  (172.7 cm) Weight: 251 lb 12.3 oz (114.2 kg) IBW/kg (Calculated) : 63.9   Temp (24hrs), Avg:97.8 F (36.6 C), Min:97.7 F (36.5 C), Max:97.9 F (36.6 C)  Recent Labs  Lab 10/16/17 0511 10/17/17 0426 10/18/17 0507 10/19/17 0433 10/20/17 0352  WBC  --   --   --  17.2* 19.4*  CREATININE 3.65* 3.80* 3.98* 4.47* 4.36*    Estimated Creatinine Clearance: 17.7 mL/min (A) (by C-G formula based on SCr of 4.36 mg/dL (H)).    Allergies  Allergen Reactions  . Crestor [Rosuvastatin Calcium] Other (See Comments)    Leg pain     Vanc 1/3 PTA >> 10/19/22 Cubicin Oct 19, 2022 PTA >> (2/15) CTX 1/6 PTA >> (2/15)  1/17 CK = 80 1/21 CK = 109 1/22 CK = 93 1/24 CK = 195  Joseguadalupe Stan D. Kyl Givler, PharmD, BCPS Clinical Pharmacist Clinical Phone for  10/20/2017 until 3:30pm: [phone] If after 3:30pm, please call main pharmacy at [phone] 10/20/2017 8:58 AM

## 2017-10-20 NOTE — Procedures (Signed)
Patient was seen on dialysis and the procedure was supervised.  BFR 250  Via vascath BP is  173/63.   Patient appears to be tolerating treatment well  Clive Parcel A 10/20/2017

## 2017-10-21 DIAGNOSIS — N170 Acute kidney failure with tubular necrosis: Secondary | ICD-10-CM

## 2017-10-21 LAB — RENAL FUNCTION PANEL
Albumin: 2.5 g/dL — ABNORMAL LOW (ref 3.5–5.0)
Anion gap: 16 — ABNORMAL HIGH (ref 5–15)
BUN: 92 mg/dL — ABNORMAL HIGH (ref 6–20)
CO2: 18 mmol/L — ABNORMAL LOW (ref 22–32)
Calcium: 7.6 mg/dL — ABNORMAL LOW (ref 8.9–10.3)
Chloride: 100 mmol/L — ABNORMAL LOW (ref 101–111)
Creatinine, Ser: 3.47 mg/dL — ABNORMAL HIGH (ref 0.44–1.00)
GFR calc Af Amer: 15 mL/min — ABNORMAL LOW (ref >60)
GFR calc non Af Amer: 13 mL/min — ABNORMAL LOW (ref >60)
Glucose, Bld: 175 mg/dL — ABNORMAL HIGH (ref 65–99)
Phosphorus: 4 mg/dL (ref 2.5–4.6)
Potassium: 3.9 mmol/L (ref 3.5–5.1)
Sodium: 134 mmol/L — ABNORMAL LOW (ref 135–145)

## 2017-10-21 LAB — HEPATITIS B CORE ANTIBODY, IGM: Hep B C IgM: NEGATIVE

## 2017-10-21 LAB — CBC
HCT: 26.5 % — ABNORMAL LOW (ref 36.0–46.0)
Hemoglobin: 8.4 g/dL — ABNORMAL LOW (ref 12.0–15.0)
MCH: 27.3 pg (ref 26.0–34.0)
MCHC: 31.7 g/dL (ref 30.0–36.0)
MCV: 86 fL (ref 78.0–100.0)
Platelets: 413 10*3/uL — ABNORMAL HIGH (ref 150–400)
RBC: 3.08 MIL/uL — ABNORMAL LOW (ref 3.87–5.11)
RDW: 18.3 % — ABNORMAL HIGH (ref 11.5–15.5)
WBC: 23.5 10*3/uL — ABNORMAL HIGH (ref 4.0–10.5)

## 2017-10-21 LAB — HEPATITIS B SURFACE ANTIBODY,QUALITATIVE: Hep B S Ab: NONREACTIVE

## 2017-10-21 MED ORDER — FUROSEMIDE 80 MG PO TABS
80.0000 mg | ORAL_TABLET | Freq: Two times a day (BID) | ORAL | Status: DC
  Administered 2017-10-21 – 2017-10-24 (×6): 80 mg via ORAL
  Filled 2017-10-21 (×8): qty 1

## 2017-10-21 MED ORDER — HYDRALAZINE HCL 20 MG/ML IJ SOLN: 10.0000 mg | Freq: Four times a day (QID) | INTRAMUSCULAR | Status: DC | PRN

## 2017-10-21 MED ORDER — CARVEDILOL 3.125 MG PO TABS
3.1250 mg | ORAL_TABLET | Freq: Two times a day (BID) | ORAL | Status: DC
  Administered 2017-10-21 – 2017-10-22 (×3): 3.125 mg via ORAL
  Filled 2017-10-21 (×3): qty 1

## 2017-10-21 MED ORDER — NITROGLYCERIN 0.4 MG SL SUBL
0.4000 mg | SUBLINGUAL_TABLET | SUBLINGUAL | Status: DC | PRN
  Administered 2017-10-29: 0.4 mg via SUBLINGUAL
  Filled 2017-10-21: qty 1

## 2017-10-21 MED ORDER — OXYCODONE HCL 5 MG PO TABS
5.0000 mg | ORAL_TABLET | Freq: Four times a day (QID) | ORAL | Status: DC | PRN
  Administered 2017-10-21 – 2017-10-23 (×4): 5 mg via ORAL
  Filled 2017-10-21 (×5): qty 1

## 2017-10-21 MED ORDER — POLYETHYLENE GLYCOL 3350 17 G PO PACK
17.0000 g | PACK | Freq: Every day | ORAL | Status: DC
  Administered 2017-10-21 – 2017-11-06 (×8): 17 g via ORAL
  Filled 2017-10-21 (×18): qty 1

## 2017-10-21 NOTE — Progress Notes (Signed)
[facility] Progress Note    Assessment/ Plan:    1. Acute kidney injury on chronic kidney disease stage IV: Baseline kidney function not great. from the history suspicious for hemodynamically mediated acute kidney injury. UA was not reflective of post-infectious GN. Complements normal- held ARB- Avoided nephrotoxins. Limiting hypotensive episodes. Baseline cr appears to be 2.4- peaked over 4- then down to 3.5- but over last 4 days up now over 4 again with BUN now over 150. Dont have clear cut reason for worsening last 3 days but baseline function certainly was marginal, then getting to danger range and uremic sxms- needed to talk dialysis- not sure if will need long term or short term. pt agreeable to proceed with PC and first HD 1/24, second on 1/25- BUN improved from 150 to 92 with very short low BFR dialysis so maybe some intrinsic renal function ?  After today, will hold over weekend and watch   2.  Acute diastolic CHF: Takes Lasix 40 mg PO at home.  Last CXR suggestive of pulmonary edema On lasix  IV here- not a robust response.  Holding Losartan, is on hydralazine.  Since holding HD will add back lasix at 80 BID PO and watch- if BP stays hig may need to resume labetalol that was stopped   2. Osteomyelitis second left toe/cellulitis: ongoing intravenous antibiotic therapy with ceftriaxone and daptomycin  3. Hypertension: BP controlled now that starting HD will likely not need as much BP meds- have stopped labetalol- still on hydralazine.  Resume diuretic- may need to add back labetalol again  5. Diabetes mellitus: Management per primary service.  6.  Dispo: pending improvement in breathing  7. Metabolic acidosis- previously on high dose bicarb- -stopped in the setting of  HD  8. Anemia- hgb down-  iron stores low, will replete and given ESA    9. Bones- phos high- start phoslo- PTH 273- no vit D as of yet   Subjective:    Still SOB -for second HD today- had short  lived CP last night- better   Objective:   BP (!) 162/63 (BP Location: Left Wrist)   Pulse 95   Temp 99.1 F (37.3 C) (Oral)   Resp 16   Ht 5\' 8"  (1.727 m)   Wt 113.5 kg (250 lb 3.6 oz)   SpO2 98%   BMI 38.05 kg/m   Intake/Output Summary (Last 24 hours) at 10/21/2017 0840 Last data filed at 10/21/2017 0500 Gross per 24 hour  Intake 1134 ml  Output 1300 ml  Net -166 ml   Weight change: -2.8 kg (-2.8 oz)  Physical Exam: Gen: sitting in bed, still a little SOB- seen on HD HEENT: +JVD CVS: RRR no m/r/g Resp: slightly increased WOB but lungs don't sound  coarse  Abd: soft, notender Ext: 1+ LE edema, RUE PICC in place- new right sided non tunneled HD cath placed 1/23 MSK: L foot with L 2nd toe osteomyelitis   Imaging: Ir Fluoro Guide Cv Line Right  Result Date: 10/19/2017 INDICATION: 62 year old female with acute kidney injury in need of hemodialysis. EXAM: IR RIGHT FLOURO GUIDE CV LINE; IR ULTRASOUND GUIDANCE VASC ACCESS RIGHT MEDICATIONS: None. ANESTHESIA/SEDATION: None FLUOROSCOPY TIME:  Fluoroscopy Time: 0 minutes 12 seconds (2 mGy). COMPLICATIONS: None immediate. PROCEDURE: Informed written consent was obtained from the patient after a thorough discussion of the procedural risks, benefits and alternatives. All questions were addressed. Maximal Sterile Barrier Technique was utilized including caps, mask, sterile gowns, sterile gloves, sterile drape,  hand hygiene and skin antiseptic. A timeout was performed prior to the initiation of the procedure. The right internal jugular vein was interrogated with ultrasound and found to be widely patent. An image was obtained and stored for the medical record. Local anesthesia was attained by infiltration with 1% lidocaine. A small dermatotomy was made. Under real-time sonographic guidance, the vessel was punctured with a 21 gauge micropuncture needle. Using standard technique, the initial micro needle was exchanged over a 0.018 micro wire for a  transitional 4 [place] micro sheath. The micro sheath was then exchanged over a 0.035 in the skin tract was dilated to 12 [place]. A 20 cm Mahurkur Trialysis catheter was then advanced over the wire and position with the tip in the mid right atrium. The catheter flushes and aspirates with ease. It was flushed with heparinized saline and secured to the skin with 0 Prolene suture. A sterile bandage was applied. IMPRESSION: Successful placement of a 20 cm right IJ approach Trialysis catheter. The catheter tip is in the mid right atrium and ready for immediate use. Electronically Signed   By: Jacqulynn Cadet M.D.   On: 10/19/2017 13:03   Ir US Guide Vasc Access Right  Result Date: 10/19/2017 INDICATION: 62 year old female with acute kidney injury in need of hemodialysis. EXAM: IR RIGHT FLOURO GUIDE CV LINE; IR ULTRASOUND GUIDANCE VASC ACCESS RIGHT MEDICATIONS: None. ANESTHESIA/SEDATION: None FLUOROSCOPY TIME:  Fluoroscopy Time: 0 minutes 12 seconds (2 mGy). COMPLICATIONS: None immediate. PROCEDURE: Informed written consent was obtained from the patient after a thorough discussion of the procedural risks, benefits and alternatives. All questions were addressed. Maximal Sterile Barrier Technique was utilized including caps, mask, sterile gowns, sterile gloves, sterile drape, hand hygiene and skin antiseptic. A timeout was performed prior to the initiation of the procedure. The right internal jugular vein was interrogated with ultrasound and found to be widely patent. An image was obtained and stored for the medical record. Local anesthesia was attained by infiltration with 1% lidocaine. A small dermatotomy was made. Under real-time sonographic guidance, the vessel was punctured with a 21 gauge micropuncture needle. Using standard technique, the initial micro needle was exchanged over a 0.018 micro wire for a transitional 4 [place] micro sheath. The micro sheath was then exchanged over a 0.035 in the skin tract was  dilated to 12 [place]. A 20 cm Mahurkur Trialysis catheter was then advanced over the wire and position with the tip in the mid right atrium. The catheter flushes and aspirates with ease. It was flushed with heparinized saline and secured to the skin with 0 Prolene suture. A sterile bandage was applied. IMPRESSION: Successful placement of a 20 cm right IJ approach Trialysis catheter. The catheter tip is in the mid right atrium and ready for immediate use. Electronically Signed   By: Jacqulynn Cadet M.D.   On: 10/19/2017 13:03    Labs: BMET Recent Labs  Lab 10/16/17 0511 10/17/17 0426 10/18/17 0507 10/19/17 0433 10/20/17 0352 10/21/17 0438  NA 135 135 134* 134* 133* 134*  K 4.6 5.4* 5.0 5.0 4.4 3.9  CL 105 104 102 102 101 100*  CO2 15* 15* 15* 16* 15* 18*  GLUCOSE 100* 152* 212* 167* 140* 175*  BUN 67* 83* 103* 121* 151* 92*  CREATININE 3.65* 3.80* 3.98* 4.47* 4.36* 3.47*  CALCIUM 8.3* 8.7* 8.7* 8.1* 7.6* 7.6*  PHOS  --   --   --  7.5* 6.1* 4.0   CBC Recent Labs  Lab 10/19/17 0433 10/20/17 0352 10/21/17  0438  WBC 17.2* 19.4* 23.5*  HGB 9.0* 8.4* 8.4*  HCT 28.0* 26.7* 26.5*  MCV 87.2 86.7 86.0  PLT 560* 446* 413*    Medications:    . aspirin EC  81 mg Oral Daily  . calcium acetate  667 mg Oral TID WC  . [START ON 10/24/2017] darbepoetin (ARANESP) injection - DIALYSIS  60 mcg Intravenous Q Mon-HD  . gabapentin  100 mg Oral Daily  . heparin  5,000 Units Subcutaneous Q8H  . hydrALAZINE  50 mg Oral BID  . isosorbide mononitrate  120 mg Oral Daily      Alfons Sulkowski A  10/21/2017, 8:40 AM

## 2017-10-21 NOTE — Progress Notes (Signed)
Occupational Therapy Treatment Patient Details Name: Kristen Berry MRN: [id number] DOB: [birth year] Today's Date: 10/21/2017    History of present illness Patient is a 62 year old female who presented to the hospital with shortness of breath. She was found to have an acute kidney injury. PMH: PVD, HTN, CHF CKD, OA    OT comments  Pt making slow progress towards goals, limited mostly by fatigue this session. Pt reporting increased fatigue due to HD earlier today, declining EOB/OOB activity but agreeable to bed level ADLs and bil UE HEP. Pt completing grooming ADLs with setup assist and UE exercises AROM and using level 2 theraband across all UE planes. Pt requiring rest breaks between each movement due to fatigue, though will to push through and participate. Began education regarding energy conservation during activity completion. HR monitored and up to 122 with activity, 115-118 at rest this session. Will continue to follow acutely to progress Pt's safety and independence with ADLs and mobility as well as to further educate Pt regarding energy conservation during functional task completion. POC remains appropriate.    Follow Up Recommendations  Home health OT;Supervision/Assistance - 24 hour    Equipment Recommendations  None recommended by OT          Precautions / Restrictions Precautions Precautions: Fall Restrictions Weight Bearing Restrictions: No       Mobility Bed Mobility Overal bed mobility: Needs Assistance             General bed mobility comments: Pt reports feeling very weak from HD and declines getting OOB today. Noted pt was slid down in the bed with feet against foot board and knees were flexed. Pt agreeable to repositioning in the bed, and was able to scoot towards [facility] with mod assist and use of bed pad for ease of scooting. Noted pt had difficulty with shoulder external rotation to get a hold on the railing for support. Bed was in trendelenberg position as well to  assist with scooting.   Transfers                      Balance Overall balance assessment: Needs assistance Sitting-balance support: Feet supported;No upper extremity supported Sitting balance-Leahy Scale: Good                                     ADL either performed or assessed with clinical judgement   ADL Overall ADL's : Needs assistance/impaired     Grooming: Set up;Bed level;Wash/dry face;Oral care                                 General ADL Comments: Pt declining EOB/OOB mobility this session due to increased fatigue from HD, agreeable to bed level grooming ADLs and completion of UB HEP; Pt HR monitored and reading 115-122 during activity                       Cognition Arousal/Alertness: Awake/alert Behavior During Therapy: WFL for tasks assessed/performed Overall Cognitive Status: Within Functional Limits for tasks assessed                                          Exercises Other Exercises Other Exercises: bil UE exercises  using level 2 theraband including shoulder flexion and abduction, elbow flexion/extension for 5x each motion Other Exercises: AROM to bil UEs for 5-10 reps each including shoulder flexion, abduction, horizontal abduction/adduction          General Comments Pt on 2L supplemental O2 throughout session    Pertinent Vitals/ Pain       Pain Assessment: No/denies pain                                                          Frequency  Min 2X/week        Progress Toward Goals  OT Goals(current goals can now be found in the care plan section)  Progress towards OT goals: Progressing toward goals  Acute Rehab OT Goals Patient Stated Goal: Feel stronger - reports feeling weak from HD today OT Goal Formulation: With patient Time For Goal Achievement: 10/31/17 Potential to Achieve Goals: Good  Plan Discharge plan remains appropriate                      AM-PAC PT "6 Clicks" Daily Activity     Outcome Measure   Help from another person eating meals?: A Little Help from another person taking care of personal grooming?: A Little Help from another person toileting, which includes using toliet, bedpan, or urinal?: A Little Help from another person bathing (including washing, rinsing, drying)?: A Little Help from another person to put on and taking off regular upper body clothing?: A Little Help from another person to put on and taking off regular lower body clothing?: A Little 6 Click Score: 18    End of Session Equipment Utilized During Treatment: Oxygen  OT Visit Diagnosis: Other abnormalities of gait and mobility (R26.89)   Activity Tolerance Patient limited by fatigue;Patient tolerated treatment well   Patient Left in bed;with call bell/phone within reach;with bed alarm set   Nurse Communication Mobility status        Time: 1530-1550 OT Time Calculation (min): 20 min  Charges: OT General Charges $OT Visit: 1 Visit OT Treatments $Therapeutic Activity: 8-22 mins  Kristen Berry, OT Pager [phone] 10/21/2017    Kristen Berry 10/21/2017, 4:25 PM

## 2017-10-21 NOTE — Progress Notes (Signed)
Physical Therapy Treatment Patient Details Name: Kristen Berry MRN: [id number] DOB: 1956-07-04 Today's Date: 10/21/2017    History of Present Illness Patient is a 62 year old female who presented to the hospital with shortness of breath. She was found to have an acute kidney injury. PMH: PVD, HTN, CHF CKD, OA     PT Comments    Pt seen this afternoon after HD session. Pt reports extreme fatigue and was not willing to attempt OOB with PT. Noted pt slid down in the bed with feet against foot board and knees flexed. Pt was agreeable to repositioning in the bed, so focus of session was bed mobility. Pt encouraged to attempt OOB to chair with nursing staff when feeling up to it. Will continue to follow and progress as able per POC.   Follow Up Recommendations  Home health PT     Equipment Recommendations  Rolling walker with 5" wheels    Recommendations for Other Services       Precautions / Restrictions Precautions Precautions: Fall Restrictions Weight Bearing Restrictions: No    Mobility  Bed Mobility Overal bed mobility: Needs Assistance             General bed mobility comments: Pt reports feeling very weak from HD and declines getting OOB today. Noted pt was slid down in the bed with feet against foot board and knees were flexed. Pt agreeable to repositioning in the bed, and was able to scoot towards [facility] with mod assist and use of bed pad for ease of scooting. Noted pt had difficulty with shoulder external rotation to get a hold on the railing for support. Bed was in trendelenberg position as well to assist with scooting.   Transfers                    Ambulation/Gait                 Stairs            Wheelchair Mobility    Modified Rankin (Stroke Patients Only)       Balance Overall balance assessment: Needs assistance Sitting-balance support: Feet supported;No upper extremity supported Sitting balance-Leahy Scale: Good                                      Cognition Arousal/Alertness: Awake/alert Behavior During Therapy: WFL for tasks assessed/performed Overall Cognitive Status: Within Functional Limits for tasks assessed                                        Exercises      General Comments General comments (skin integrity, edema, etc.): Pt on 2L supplemental O2 throughout session      Pertinent Vitals/Pain Pain Assessment: No/denies pain    Home Living                      Prior Function            PT Goals (current goals can now be found in the care plan section) Acute Rehab PT Goals Patient Stated Goal: Feel stronger - reports feeling weak from HD today PT Goal Formulation: With patient Time For Goal Achievement: 10/22/17 Potential to Achieve Goals: Good Progress towards PT goals: Progressing toward goals    Frequency  Min 3X/week      PT Plan Current plan remains appropriate    Co-evaluation              AM-PAC PT "6 Clicks" Daily Activity  Outcome Measure  Difficulty turning over in bed (including adjusting bedclothes, sheets and blankets)?: None Difficulty moving from lying on back to sitting on the side of the bed? : A Little Difficulty sitting down on and standing up from a chair with arms (e.g., wheelchair, bedside commode, etc,.)?: A Little Help needed moving to and from a bed to chair (including a wheelchair)?: A Little Help needed walking in hospital room?: A Little Help needed climbing 3-5 steps with a railing? : A Little 6 Click Score: 19    End of Session   Activity Tolerance: Patient limited by fatigue(after HD session) Patient left: in bed;with call bell/phone within reach;with bed alarm set Nurse Communication: Mobility status PT Visit Diagnosis: Unsteadiness on feet (R26.81);Difficulty in walking, not elsewhere classified (R26.2)     Time: 2440-1027 PT Time Calculation (min) (ACUTE ONLY): 9 min  Charges:   $Therapeutic Activity: 8-22 mins                    G Codes:       [name], PT, DPT Acute Rehabilitation Services Pager: [phone]    [name] 10/21/2017, 2:07 PM

## 2017-10-21 NOTE — Progress Notes (Addendum)
PROGRESS NOTE    Kristen Berry  VXY:[id number] DOB: [birth year] DOA: 10/10/2017 PCP: Martin[place], Sarah T, MD   Brief Narrative: 62 year old female with history of peripheral vascular disease, chronic kidney disease stage IV with baseline creatinine around 2.4, recently diagnosed with left toe osteomyelitis at [facility] and discharged with IV daptomycin and ceftriaxone. [facility] here. As per prior note, it was discussed with the surgery and no surgical procedure was recommended due to peripheral vascular disease. The plan was to follow-up with vascular and orthopedics as outpatient.  Patient was transferred to [facility] for worsening renal failure and for nephrology consult.    Placement of temporary dialysis catheter and initiation of hemodialysis on 1/23/219.  Assessment & Plan:  #Acute kidney injury on CKD stage IV: -Patient had a worsening renal failure with some uremic symptoms including generalized weakness, poor sleep and shortness of breath.  Did not respond with IV diuretics. Temporary catheter placed on 10/19/2017 and started dialysis on 1/24.  Second dialysis today.  Tolerating well.  Plan to hold off dialysis for next few days.  Starting oral Lasix.  Watch for renal recovery.  Strict ins and out.   UA has no protein and RBCs. Complements normal.  Renal ultrasound consistent with chronic kidney disease.   CK level normal, urine eos negative, kappa lambda ratio normal.  #Anemia of chronic kidney disease: Continue IV iron and ESA during dialysis.  #Shortness of breath/acute pulmonary edema/acute systolic congestive heart failure: Chest x-ray with cardiomegaly with vascular congestion and edema.   -Elevated BNP.  started hemodialysis treatment. -Echocardiogram with EF of 35-40%.  -Continue aspirin, hydralazine, Imdur, Lasix. -Plan for ischemia evaluation, follow-up cardiology consult.  #History  of mild intermittent asthma: Stable, no exacerbation.    #Left second toe osteomyelitis: As per prior progress note, she will follow-up with orthopedics outpatient and plan for vascular procedures. She was recently discharged from [facility] with IV daptomycin and ceftriaxone. As per patient she was getting IV antibiotics daily at the hospital and the plan is to continue until November 11, 2017. I have discussed with the case manager.[facility] is requesting the prescription for antibiotics to resume after discharge. I consulted pharmacist.   -Follow-up blood culture, negatives so far. -spoke with pt's daughter who was concerned about pt's pain. Ordered oxycodone as needed and miralax.   #Type 2 diabetes: Continue to monitor blood sugar level. A1c 5.5.  #Hypertension: Blood pressure elevated likely contributed by volume.  Receiving second dialysis today.  Starting Lasix.  Continue hydralazine and Imdur.  Added IV hydralazine as needed for the management of uncontrolled hypertension.  May need to resume labetalol depending on blood pressure monitoring.  #Physical deconditioning: PT OT evaluation.   DVT prophylaxis: SCD.  Holding heparin because of significant abdominal bruises and anemia.  Consider resuming heparin subcutaneous when BUN level drops further.  Encourage ambulation. Code Status: Full code Family Communication: d/w pt's daughter over the phone. Disposition Plan: Likely discharge home in 3-4 days.    Consultants:   Nephrology  Cardiology  Procedures: None Antimicrobials: On daptomycin and IV ceftriaxone since before admission  Subjective: Seen and examined at dialysis unit.  Overnight event noted.  Denies chest pain, shortness of breath today.  No nausea vomiting. Objective: Vitals:   10/21/17 1000 10/21/17 1030 10/21/17 1100 10/21/17 1120  BP: (!) 181/77 (!) 173/80 (!) 197/67 (!) (P) 183/77  Pulse: 87 83 86 (P) 93  Resp:    (  P) 16  Temp:        TempSrc:    (P) Oral  SpO2:    (P) 99%  Weight:    (P) 109.2 kg (240 lb 11.9 oz)  Height:        Intake/Output Summary (Last 24 hours) at 10/21/2017 1156 Last data filed at 10/21/2017 0500 Gross per 24 hour  Intake 774 ml  Output -  Net 774 ml   Filed Weights   10/20/17 0704 10/20/17 0908 10/21/17 1120  Weight: 114.2 kg (251 lb 12.3 oz) 113.5 kg (250 lb 3.6 oz) (P) 109.2 kg (240 lb 11.9 oz)    Examination:  General exam: Not in distress, receiving hemodialysis Respiratory system: Bibasilar crackles and decreased breath sound, no wheezing Cardiovascular system: Regular rate rhythm S1-S2 normal.  Trace lower extremities edema. Gastrointestinal system: Soft, nontender.  Bowel sounds positive. Central nervous system: Alert awake and following commands. Extremities: dressing applied in left toe. Skin: No rashes, lesions or ulcers Psychiatry: Judgement and insight appear normal. Mood & affect appropriate.  Temporary catheter site clean with no bleeding.   Data Reviewed: I have personally reviewed following labs and imaging studies  CBC: Recent Labs  Lab 10/19/17 0433 10/20/17 0352 10/21/17 0438  WBC 17.2* 19.4* 23.5*  HGB 9.0* 8.4* 8.4*  HCT 28.0* 26.7* 26.5*  MCV 87.2 86.7 86.0  PLT 560* 446* 825*   Basic Metabolic Panel: Recent Labs  Lab 10/17/17 0426 10/18/17 0507 10/19/17 0433 10/20/17 0352 10/21/17 0438  NA 135 134* 134* 133* 134*  K 5.4* 5.0 5.0 4.4 3.9  CL 104 102 102 101 100*  CO2 15* 15* 16* 15* 18*  GLUCOSE 152* 212* 167* 140* 175*  BUN 83* 103* 121* 151* 92*  CREATININE 3.80* 3.98* 4.47* 4.36* 3.47*  CALCIUM 8.7* 8.7* 8.1* 7.6* 7.6*  PHOS  --   --  7.5* 6.1* 4.0   GFR: Estimated Creatinine Clearance: 22.2 mL/min (A) (by C-G formula based on SCr of 3.47 mg/dL (H)). Liver Function Tests: Recent Labs  Lab 10/18/17 0507 10/19/17 0433 10/20/17 0352 10/21/17 0438  AST 13*  --   --   --   ALT 13*  --   --   --   ALKPHOS 75  --   --   --    BILITOT 0.5  --   --   --   PROT 7.0  --   --   --   ALBUMIN 2.8* 2.8* 2.7* 2.5*   No results for input(s): LIPASE, AMYLASE in the last 168 hours. No results for input(s): AMMONIA in the last 168 hours. Coagulation Profile: No results for input(s): INR, PROTIME in the last 168 hours. Cardiac Enzymes: Recent Labs  Lab 10/17/17 0426 10/18/17 0507 10/20/17 0352  CKTOTAL 109 93 195   BNP (last 3 results) No results for input(s): PROBNP in the last 8760 hours. HbA1C: No results for input(s): HGBA1C in the last 72 hours. CBG: No results for input(s): GLUCAP in the last 168 hours. Lipid Profile: No results for input(s): CHOL, HDL, LDLCALC, TRIG, CHOLHDL, LDLDIRECT in the last 72 hours. Thyroid Function Tests: No results for input(s): TSH, T4TOTAL, FREET4, T3FREE, THYROIDAB in the last 72 hours. Anemia Panel: No results for input(s): VITAMINB12, FOLATE, FERRITIN, TIBC, IRON, RETICCTPCT in the last 72 hours. Sepsis Labs: No results for input(s): PROCALCITON, LATICACIDVEN in the last 168 hours.  Recent Results (from the past 240 hour(s))  Culture, blood (routine x 2)     Status: None (Preliminary  result)   Collection Time: 10/19/17  2:56 PM  Result Value Ref Range Status   Specimen Description BLOOD LEFT ANTECUBITAL  Final   Special Requests   Final    BOTTLES DRAWN AEROBIC AND ANAEROBIC Blood Culture results may not be optimal due to an inadequate volume of blood received in culture bottles   Culture NO GROWTH < 24 HOURS  Final   Report Status PENDING  Incomplete  Culture, blood (routine x 2)     Status: None (Preliminary result)   Collection Time: 10/19/17  3:00 PM  Result Value Ref Range Status   Specimen Description BLOOD LEFT HAND  Final   Special Requests   Final    BOTTLES DRAWN AEROBIC AND ANAEROBIC Blood Culture adequate volume   Culture NO GROWTH < 24 HOURS  Final   Report Status PENDING  Incomplete         Radiology Studies: Ir Fluoro Guide Cv Line  Right  Result Date: 10/19/2017 INDICATION: 62 year old female with acute kidney injury in need of hemodialysis. EXAM: IR RIGHT FLOURO GUIDE CV LINE; IR ULTRASOUND GUIDANCE VASC ACCESS RIGHT MEDICATIONS: None. ANESTHESIA/SEDATION: None FLUOROSCOPY TIME:  Fluoroscopy Time: 0 minutes 12 seconds (2 mGy). COMPLICATIONS: None immediate. PROCEDURE: Informed written consent was obtained from the patient after a thorough discussion of the procedural risks, benefits and alternatives. All questions were addressed. Maximal Sterile Barrier Technique was utilized including caps, mask, sterile gowns, sterile gloves, sterile drape, hand hygiene and skin antiseptic. A timeout was performed prior to the initiation of the procedure. The right internal jugular vein was interrogated with ultrasound and found to be widely patent. An image was obtained and stored for the medical record. Local anesthesia was attained by infiltration with 1% lidocaine. A small dermatotomy was made. Under real-time sonographic guidance, the vessel was punctured with a 21 gauge micropuncture needle. Using standard technique, the initial micro needle was exchanged over a 0.018 micro wire for a transitional 4 [place] micro sheath. The micro sheath was then exchanged over a 0.035 in the skin tract was dilated to 12 [place]. A 20 cm Mahurkur Trialysis catheter was then advanced over the wire and position with the tip in the mid right atrium. The catheter flushes and aspirates with ease. It was flushed with heparinized saline and secured to the skin with 0 Prolene suture. A sterile bandage was applied. IMPRESSION: Successful placement of a 20 cm right IJ approach Trialysis catheter. The catheter tip is in the mid right atrium and ready for immediate use. Electronically Signed   By: Jacqulynn Cadet M.D.   On: 10/19/2017 13:03   Ir US Guide Vasc Access Right  Result Date: 10/19/2017 INDICATION: 62 year old female with acute kidney injury in need of  hemodialysis. EXAM: IR RIGHT FLOURO GUIDE CV LINE; IR ULTRASOUND GUIDANCE VASC ACCESS RIGHT MEDICATIONS: None. ANESTHESIA/SEDATION: None FLUOROSCOPY TIME:  Fluoroscopy Time: 0 minutes 12 seconds (2 mGy). COMPLICATIONS: None immediate. PROCEDURE: Informed written consent was obtained from the patient after a thorough discussion of the procedural risks, benefits and alternatives. All questions were addressed. Maximal Sterile Barrier Technique was utilized including caps, mask, sterile gowns, sterile gloves, sterile drape, hand hygiene and skin antiseptic. A timeout was performed prior to the initiation of the procedure. The right internal jugular vein was interrogated with ultrasound and found to be widely patent. An image was obtained and stored for the medical record. Local anesthesia was attained by infiltration with 1% lidocaine. A small dermatotomy was made. Under real-time sonographic guidance, the  vessel was punctured with a 21 gauge micropuncture needle. Using standard technique, the initial micro needle was exchanged over a 0.018 micro wire for a transitional 4 [place] micro sheath. The micro sheath was then exchanged over a 0.035 in the skin tract was dilated to 12 [place]. A 20 cm Mahurkur Trialysis catheter was then advanced over the wire and position with the tip in the mid right atrium. The catheter flushes and aspirates with ease. It was flushed with heparinized saline and secured to the skin with 0 Prolene suture. A sterile bandage was applied. IMPRESSION: Successful placement of a 20 cm right IJ approach Trialysis catheter. The catheter tip is in the mid right atrium and ready for immediate use. Electronically Signed   By: Jacqulynn Cadet M.D.   On: 10/19/2017 13:03        Scheduled Meds: . aspirin EC  81 mg Oral Daily  . calcium acetate  667 mg Oral TID WC  . [START ON 10/24/2017] darbepoetin (ARANESP) injection - DIALYSIS  60 mcg Intravenous Q Mon-HD  . furosemide  80 mg Oral BID  .  gabapentin  100 mg Oral Daily  . heparin  5,000 Units Subcutaneous Q8H  . hydrALAZINE  50 mg Oral BID  . isosorbide mononitrate  120 mg Oral Daily   Continuous Infusions: . cefTRIAXone (ROCEPHIN)  IV Stopped (10/20/17 1627)  . DAPTOmycin (CUBICIN)  IV Stopped (10/20/17 1807)     LOS: 11 days    Farris Blash Tanna Furry, MD [facility] Pager [phone]  If 7PM-7AM, please contact night-coverage [email or web address] Password [facility] 10/21/2017, 11:56 AM

## 2017-10-21 NOTE — Progress Notes (Signed)
Pt is lying calmly in bed asleep. Awaken pt, denies pain or dizziness at this time. Will monitor pt. Vitals rechecked.

## 2017-10-21 NOTE — Progress Notes (Signed)
OT Cancellation Note  Patient Details Name: Kristen Berry MRN: [id number] DOB: 11-21-1955   Cancelled Treatment:    Reason Eval/Treat Not Completed: Patient at procedure or test/ unavailable (HD), will follow up as schedule allows and as Pt is available.  [name], OT Pager [phone] 10/21/2017  [name] 10/21/2017, 11:08 AM

## 2017-10-21 NOTE — Procedures (Signed)
Patient was seen on dialysis and the procedure was supervised.  BFR 250  Via PC BP is  162/63.   Patient appears to be tolerating treatment well  Viola Kinnick A 10/21/2017

## 2017-10-21 NOTE — Progress Notes (Signed)
Pt with mild chest pain, refused pain medicine, EKG done, notified MD.

## 2017-10-21 NOTE — Progress Notes (Signed)
Checked pt in her room. Pt stated chest pain is gone, and said she is feeling good. At this time. Will monitor pt.

## 2017-10-21 NOTE — Progress Notes (Addendum)
The patient has been seen in conjunction with Vin Bhagat, PAC. All aspects of care have been considered and discussed. The patient has been personally interviewed, examined, and all clinical data has been reviewed.   Recurring episodes of chest pressure.  Associated with diaphoresis.  Already on max dose long-acting nitrates.  Continues as needed sublingual nitroglycerin for episodes  We need better blood pressure control.  Agree with carvedilol and up titrating as tolerated by heart rate and blood pressure.  Lowest possible dose has been started today.  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  Coronary angiography when possible.  Progress Note  Patient Name: Kristen Berry Date of Encounter: 10/21/2017  Primary Cardiologist: Dr. Tamala Julian (new)  Subjective   Dyspnea improving. No chest pain.   Inpatient Medications    Scheduled Meds: . aspirin EC  81 mg Oral Daily  . calcium acetate  667 mg Oral TID WC  . [START ON 10/24/2017] darbepoetin (ARANESP) injection - DIALYSIS  60 mcg Intravenous Q Mon-HD  . furosemide  80 mg Oral BID  . gabapentin  100 mg Oral Daily  . hydrALAZINE  50 mg Oral BID  . isosorbide mononitrate  120 mg Oral Daily   Continuous Infusions: . cefTRIAXone (ROCEPHIN)  IV Stopped (10/20/17 1627)  . DAPTOmycin (CUBICIN)  IV Stopped (10/20/17 1807)   PRN Meds: acetaminophen **OR** acetaminophen, camphor-menthol **AND** hydrOXYzine, docusate sodium, feeding supplement (NEPRO CARB STEADY), hydrALAZINE, ipratropium-albuterol, lidocaine, ondansetron **OR** ondansetron (ZOFRAN) IV, sodium chloride flush, sorbitol, zolpidem   Vital Signs    Vitals:   10/21/17 1000 10/21/17 1030 10/21/17 1100 10/21/17 1120  BP: (!) 181/77 (!) 173/80 (!) 197/67 (!) (P) 183/77  Pulse: 87 83 86 (P) 93  Resp:    (P) 16  Temp:    (P) 98.4 F (36.9 C)  TempSrc:    (P) Axillary  SpO2:    (P) 99%  Weight:    (P) 240 lb  11.9 oz (109.2 kg)  Height:        Intake/Output Summary (Last 24 hours) at 10/21/2017 1238 Last data filed at 10/21/2017 1120 Gross per 24 hour  Intake 774 ml  Output 2006 ml  Net -1232 ml   Filed Weights   10/20/17 0704 10/20/17 0908 10/21/17 1120  Weight: 251 lb 12.3 oz (114.2 kg) 250 lb 3.6 oz (113.5 kg) (P) 240 lb 11.9 oz (109.2 kg)    Telemetry    SR with intermittent tachycardia in 120s - Personally Reviewed  ECG      Physical Exam   GEN: Obese female in  No acute distress.   Neck: + JVD Cardiac: RRR, no murmurs, rubs, or gallops.  Respiratory: Clear to auscultation bilaterally. GI: Soft, nontender, non-distended  MS: 1+ bL LE edema; No deformity. Neuro:  Nonfocal  Psych: Normal affect   Labs    Chemistry Recent Labs  Lab 10/18/17 0507 10/19/17 0433 10/20/17 0352 10/21/17 0438  NA 134* 134* 133* 134*  K 5.0 5.0 4.4 3.9  CL 102 102 101 100*  CO2 15* 16* 15* 18*  GLUCOSE 212* 167* 140* 175*  BUN 103* 121* 151* 92*  CREATININE 3.98* 4.47* 4.36* 3.47*  CALCIUM 8.7* 8.1* 7.6* 7.6*  PROT 7.0  --   --   --   ALBUMIN 2.8* 2.8* 2.7* 2.5*  AST 13*  --   --   --   ALT 13*  --   --   --  ALKPHOS 75  --   --   --   BILITOT 0.5  --   --   --   GFRNONAA 11* 10* 10* 13*  GFRAA 13* 11* 12* 15*  ANIONGAP 17* 16* 17* 16*     Hematology Recent Labs  Lab 10/19/17 0433 10/20/17 0352 10/21/17 0438  WBC 17.2* 19.4* 23.5*  RBC 3.21* 3.08* 3.08*  HGB 9.0* 8.4* 8.4*  HCT 28.0* 26.7* 26.5*  MCV 87.2 86.7 86.0  MCH 28.0 27.3 27.3  MCHC 32.1 31.5 31.7  RDW 18.4* 18.2* 18.3*  PLT 560* 446* 413*    Cardiac EnzymesNo results for input(s): TROPONINI in the last 168 hours. No results for input(s): TROPIPOC in the last 168 hours.   BNP Recent Labs  Lab 10/17/17 1911  BNP 2,204.7*     DDimer No results for input(s): DDIMER in the last 168 hours.   Radiology    Ir Cyndy Freeze Guide Cv Line Right  Result Date: 10/19/2017 INDICATION: 62 year old female with  acute kidney injury in need of hemodialysis. EXAM: IR RIGHT FLOURO GUIDE CV LINE; IR ULTRASOUND GUIDANCE VASC ACCESS RIGHT MEDICATIONS: None. ANESTHESIA/SEDATION: None FLUOROSCOPY TIME:  Fluoroscopy Time: 0 minutes 12 seconds (2 mGy). COMPLICATIONS: None immediate. PROCEDURE: Informed written consent was obtained from the patient after a thorough discussion of the procedural risks, benefits and alternatives. All questions were addressed. Maximal Sterile Barrier Technique was utilized including caps, mask, sterile gowns, sterile gloves, sterile drape, hand hygiene and skin antiseptic. A timeout was performed prior to the initiation of the procedure. The right internal jugular vein was interrogated with ultrasound and found to be widely patent. An image was obtained and stored for the medical record. Local anesthesia was attained by infiltration with 1% lidocaine. A small dermatotomy was made. Under real-time sonographic guidance, the vessel was punctured with a 21 gauge micropuncture needle. Using standard technique, the initial micro needle was exchanged over a 0.018 micro wire for a transitional 4 [place] micro sheath. The micro sheath was then exchanged over a 0.035 in the skin tract was dilated to 12 [place]. A 20 cm Mahurkur Trialysis catheter was then advanced over the wire and position with the tip in the mid right atrium. The catheter flushes and aspirates with ease. It was flushed with heparinized saline and secured to the skin with 0 Prolene suture. A sterile bandage was applied. IMPRESSION: Successful placement of a 20 cm right IJ approach Trialysis catheter. The catheter tip is in the mid right atrium and ready for immediate use. Electronically Signed   By: Jacqulynn Cadet M.D.   On: 10/19/2017 13:03   Ir US Guide Vasc Access Right  Result Date: 10/19/2017 INDICATION: 62 year old female with acute kidney injury in need of hemodialysis. EXAM: IR RIGHT FLOURO GUIDE CV LINE; IR ULTRASOUND GUIDANCE VASC  ACCESS RIGHT MEDICATIONS: None. ANESTHESIA/SEDATION: None FLUOROSCOPY TIME:  Fluoroscopy Time: 0 minutes 12 seconds (2 mGy). COMPLICATIONS: None immediate. PROCEDURE: Informed written consent was obtained from the patient after a thorough discussion of the procedural risks, benefits and alternatives. All questions were addressed. Maximal Sterile Barrier Technique was utilized including caps, mask, sterile gowns, sterile gloves, sterile drape, hand hygiene and skin antiseptic. A timeout was performed prior to the initiation of the procedure. The right internal jugular vein was interrogated with ultrasound and found to be widely patent. An image was obtained and stored for the medical record. Local anesthesia was attained by infiltration with 1% lidocaine. A small dermatotomy was made. Under real-time sonographic guidance,  the vessel was punctured with a 21 gauge micropuncture needle. Using standard technique, the initial micro needle was exchanged over a 0.018 micro wire for a transitional 4 [place] micro sheath. The micro sheath was then exchanged over a 0.035 in the skin tract was dilated to 12 [place]. A 20 cm Mahurkur Trialysis catheter was then advanced over the wire and position with the tip in the mid right atrium. The catheter flushes and aspirates with ease. It was flushed with heparinized saline and secured to the skin with 0 Prolene suture. A sterile bandage was applied. IMPRESSION: Successful placement of a 20 cm right IJ approach Trialysis catheter. The catheter tip is in the mid right atrium and ready for immediate use. Electronically Signed   By: Jacqulynn Cadet M.D.   On: 10/19/2017 13:03    Cardiac Studies   TTE: 10/17/17  Study Conclusions  - Left ventricle: Septal and apical akinesis. The cavity size was moderately dilated. Wall thickness was increased in a pattern of mild LVH. Systolic function was moderately reduced. The estimated ejection fraction was in the range of 35% to  40%. Doppler parameters are consistent with both elevated ventricular end-diastolic filling pressure and elevated left atrial filling pressure. - Mitral valve: Moderately calcified, moderately thickened annulus. Moderately thickened leaflets . The findings are consistent with mild stenosis. There was moderate regurgitation. Valve area by continuity equation (using LVOT flow): 1.5 cm^2. - Left atrium: The atrium was mildly dilated. - Atrial septum: No defect or patent foramen ovale was identified. - Pulmonary arteries: PA peak pressure: 55 mm Hg (S).  Patient Profile     62 y.o. female with PMH of systolic HF, HTN, HL, CKD IV, osteomyelitis and PVD who presented with worsening renal function. Developed acute respiratory distress and acute renal failure.   Assessment & Plan    1. Acute on chronic systolic HF: - Lasix was held on admission 2/2 worsening renal function. Then developed acute dyspnea and restarted on oral-->IV. CXR with edema 1/19 Reported had a EF of 45% in the past, but echo this admission shows a drop in EF to 35% with septal and apical hypokinesis. Attempts at IV diuresis were unsuccessful and lead to worsening Cr. - Dialysis started this admission. Breathing improving. Also no diuretics. Managed by nephrologist.  - Last dose of labetalol on 1/22. She is hypertensive. Given low EF will start Coreg 3.125. Up titrate as blood pressure allows. Continue hydralazine and Imdur. No ACE/ARB due to CKD. - Will plan for cath sometime next week when able to lay flat and more stable.  - Will be available over weekend as needed.   2. HTN: - AS above  3. Hx of DM: - Was on metformin in the past, but Hgb A1c improved per her report. Hgb A1c 5.5 here.   4. Anemia: - Hgb of 8.4 Suspect 2/2 to renal disease. Per primary.   5. HL: - LDL 109 is above goal. Hx of statin intolerance.  -  consider retrial of statin. Per MD.    For questions or updates, please  contact [place] Please consult [email or web address] for contact info under Cardiology/STEMI.      Jarrett Soho, PA  10/21/2017, 12:38 PM

## 2017-10-22 LAB — CBC
HCT: 27.1 % — ABNORMAL LOW (ref 36.0–46.0)
Hemoglobin: 8.6 g/dL — ABNORMAL LOW (ref 12.0–15.0)
MCH: 27.7 pg (ref 26.0–34.0)
MCHC: 31.7 g/dL (ref 30.0–36.0)
MCV: 87.4 fL (ref 78.0–100.0)
Platelets: 427 10*3/uL — ABNORMAL HIGH (ref 150–400)
RBC: 3.1 MIL/uL — ABNORMAL LOW (ref 3.87–5.11)
RDW: 18.9 % — ABNORMAL HIGH (ref 11.5–15.5)
WBC: 18.8 10*3/uL — ABNORMAL HIGH (ref 4.0–10.5)

## 2017-10-22 LAB — BASIC METABOLIC PANEL
Anion gap: 13 (ref 5–15)
BUN: 53 mg/dL — ABNORMAL HIGH (ref 6–20)
CO2: 23 mmol/L (ref 22–32)
Calcium: 7.9 mg/dL — ABNORMAL LOW (ref 8.9–10.3)
Chloride: 99 mmol/L — ABNORMAL LOW (ref 101–111)
Creatinine, Ser: 2.76 mg/dL — ABNORMAL HIGH (ref 0.44–1.00)
GFR calc Af Amer: 20 mL/min — ABNORMAL LOW (ref >60)
GFR calc non Af Amer: 17 mL/min — ABNORMAL LOW (ref >60)
Glucose, Bld: 134 mg/dL — ABNORMAL HIGH (ref 65–99)
Potassium: 3.7 mmol/L (ref 3.5–5.1)
Sodium: 135 mmol/L (ref 135–145)

## 2017-10-22 MED ORDER — CARVEDILOL 3.125 MG PO TABS
3.1250 mg | ORAL_TABLET | Freq: Once | ORAL | Status: AC
  Administered 2017-10-22: 3.125 mg via ORAL
  Filled 2017-10-22: qty 1

## 2017-10-22 MED ORDER — LABETALOL HCL 100 MG PO TABS: 200.0000 mg | ORAL_TABLET | Freq: Two times a day (BID) | ORAL | Status: DC

## 2017-10-22 MED ORDER — CARVEDILOL 6.25 MG PO TABS
6.2500 mg | ORAL_TABLET | Freq: Two times a day (BID) | ORAL | Status: DC
  Administered 2017-10-22 – 2017-10-24 (×5): 6.25 mg via ORAL
  Filled 2017-10-22 (×5): qty 1

## 2017-10-22 NOTE — Progress Notes (Signed)
[facility] Progress Note    Assessment/ Plan:    1. Acute kidney injury on chronic kidney disease stage IV: Baseline kidney function not great. Suffered hemodynamically mediated acute kidney injury this hosp. UA was not reflective of post-infectious GN. Complements normal- held ARB- Avoided nephrotoxins. Limiting hypotensive episodes. Baseline crt at 2.4- peaked over 4- then down to 3.5- but then worse again with crt  over 4 again with BUN over 150. Dont have clear cut reason for most recent worsening  but baseline function certainly was marginal, had PC and first HD 1/24, second on 1/25- BUN improved from 150 to 92 then to 50s with very short low BFR dialysis so maybe some intrinsic renal function ?   hold over weekend and watch UOP and labs- so far not looking good- no UOP according to pt.  If labs worse and still minimal UOP will commit to HD- CLIP and make arrangements for perm access   2.  Acute diastolic CHF: Takes Lasix 40 mg PO at home.  Last CXR suggestive of pulmonary edema On lasix  IV here- not a robust response.  Holding Losartan, is on hydralazine.  Since holding HD will add back lasix at 80 BID PO and watch-  BP staying high  resume labetalol that was stopped   2. Osteomyelitis second left toe/cellulitis: ongoing intravenous antibiotic therapy with ceftriaxone and daptomycin  3. Hypertension: BP controlled now that starting HD will likely not need as much BP meds- have stopped labetalol but need to add back- still on hydralazine.  Resume diuretic-   5. Diabetes mellitus: Management per primary service.  6.  Dispo: pending improvement in breathing  7. Metabolic acidosis- previously on high dose bicarb- -stopped in the setting of  HD  8. Anemia- hgb down-  iron stores low, will repleting and giving ESA    9. Bones- phos high- start phoslo- PTH 273- no vit D as of yet   Subjective:    Still SOB -s/p second HD yest- removed 2000 tolerated well    Objective:    BP (!) 184/64   Pulse 82   Temp (!) 97.4 F (36.3 C) (Oral)   Resp 16   Ht 5\' 8"  (1.727 m)   Wt 112.5 kg (248 lb 0.3 oz)   SpO2 96%   BMI 37.71 kg/m   Intake/Output Summary (Last 24 hours) at 10/22/2017 1012 Last data filed at 10/22/2017 0100 Gross per 24 hour  Intake 350 ml  Output 2006 ml  Net -1656 ml   Weight change: -3 kg (-9.8 oz)  Physical Exam: Gen: sitting in bed, still a little SOB- seen on HD HEENT: +JVD CVS: RRR no m/r/g Resp: slightly increased WOB but lungs don't sound  coarse  Abd: soft, notender Ext: 1+ LE edema, RUE PICC in place- new right sided non tunneled HD cath placed 1/23 MSK: L foot with L 2nd toe osteomyelitis   Imaging: No results found.  Labs: BMET Recent Labs  Lab 10/16/17 0511 10/17/17 0426 10/18/17 0507 10/19/17 0433 10/20/17 0352 10/21/17 0438 10/22/17 0415  NA 135 135 134* 134* 133* 134* 135  K 4.6 5.4* 5.0 5.0 4.4 3.9 3.7  CL 105 104 102 102 101 100* 99*  CO2 15* 15* 15* 16* 15* 18* 23  GLUCOSE 100* 152* 212* 167* 140* 175* 134*  BUN 67* 83* 103* 121* 151* 92* 53*  CREATININE 3.65* 3.80* 3.98* 4.47* 4.36* 3.47* 2.76*  CALCIUM 8.3* 8.7* 8.7* 8.1* 7.6* 7.6* 7.9*  PHOS  --   --   --  7.5* 6.1* 4.0  --    CBC Recent Labs  Lab 10/19/17 0433 10/20/17 0352 10/21/17 0438 10/22/17 0415  WBC 17.2* 19.4* 23.5* 18.8*  HGB 9.0* 8.4* 8.4* 8.6*  HCT 28.0* 26.7* 26.5* 27.1*  MCV 87.2 86.7 86.0 87.4  PLT 560* 446* 413* 427*    Medications:    . aspirin EC  81 mg Oral Daily  . calcium acetate  667 mg Oral TID WC  . carvedilol  3.125 mg Oral BID WC  . [START ON 10/24/2017] darbepoetin (ARANESP) injection - DIALYSIS  60 mcg Intravenous Q Mon-HD  . furosemide  80 mg Oral BID  . gabapentin  100 mg Oral Daily  . hydrALAZINE  50 mg Oral BID  . isosorbide mononitrate  120 mg Oral Daily  . polyethylene glycol  17 g Oral Daily      Terease Marcotte A  10/22/2017, 10:12 AM

## 2017-10-22 NOTE — Progress Notes (Signed)
PROGRESS NOTE    Kristen Berry  LPF:[id number] DOB: 08-11-1956 DOA: 10/10/2017 PCP: [place], Sarah T, MD   Brief Narrative: 62 year old female with history of peripheral vascular disease, chronic kidney disease stage IV with baseline creatinine around 2.4, recently diagnosed with left toe osteomyelitis at [facility] and discharged with IV daptomycin and ceftriaxone. Patient was receiving IV antibiotics daily at [facility] before transfer here. As per prior note, it was discussed with the surgery and no surgical procedure was recommended due to peripheral vascular disease. The plan was to follow-up with vascular and orthopedics as outpatient.  Patient was transferred to [facility] for worsening renal failure and for nephrology consult.    Placement of temporary dialysis catheter and initiation of hemodialysis on 1/23/219.  10/22/17 -Patient is seen.  No urine output.  Nephrology input is appreciated.  Assessment & Plan:  #Acute kidney injury on CKD stage IV: -Patient had a worsening renal failure with some uremic symptoms including generalized weakness, poor sleep and shortness of breath.  Did not respond with IV diuretics. Temporary catheter placed on 10/19/2017 and started dialysis on 1/24.  Second dialysis today.  Tolerating well.  Plan to hold off dialysis for next few days.  Starting oral Lasix.  Watch for renal recovery.  Strict ins and out.   UA has no protein and RBCs. Complements normal.  Renal ultrasound consistent with chronic kidney disease.   CK level normal, urine eos negative, kappa lambda ratio normal.  No urine output.  Further management as per nephrology team.  #Anemia of chronic kidney disease: Continue IV iron and ESA during dialysis.  #Shortness of breath/acute pulmonary edema/acute systolic congestive heart failure: Chest x-ray with cardiomegaly with vascular congestion and edema.   -Elevated BNP.  started hemodialysis  treatment. -Echocardiogram with EF of 35-40%.  -Continue aspirin, hydralazine, Imdur, Lasix. -Plan for ischemia evaluation, follow-up cardiology consult.  #History of mild intermittent asthma: Stable, no exacerbation.    #Left second toe osteomyelitis: As per prior progress note, she will follow-up with orthopedics outpatient and plan for vascular procedures. She was recently discharged from [facility] with IV daptomycin and ceftriaxone. As per patient she was getting IV antibiotics daily at the hospital and the plan is to continue until November 11, 2017. I have discussed with the case manager.[facility] is requesting the prescription for antibiotics to resume after discharge. I consulted pharmacist.   -Follow-up blood culture, negatives so far. -spoke with pt's daughter who was concerned about pt's pain. Ordered oxycodone as needed and miralax.   #Type 2 diabetes: Continue to monitor blood sugar level. A1c 5.5.  #Hypertension: Blood pressure elevated likely contributed by volume.  Receiving second dialysis today.  Starting Lasix.  Continue hydralazine and Imdur.  Added IV hydralazine as needed for the management of uncontrolled hypertension.  May need to resume labetalol depending on blood pressure monitoring.  #Physical deconditioning: PT OT evaluation.   DVT prophylaxis: SCD.  Holding heparin because of significant abdominal bruises and anemia.  Consider resuming heparin subcutaneous when BUN level drops further.  Encourage ambulation. Code Status: Full code Family Communication: d/w pt's daughter over the phone. Disposition Plan: Likely discharge home in 3-4 days.    Consultants:   Nephrology  Cardiology  Procedures: None Antimicrobials: On daptomycin and IV ceftriaxone since before admission  Subjective: No new complaints. No urine outputs.  Objective: Vitals:   10/21/17 1358 10/21/17 2012 10/22/17 0449 10/22/17 0824  BP: (!) 152/57 (!) 154/64  (!) 164/65 [name])  184/64  Pulse: 97 86 83 82  Resp:  16 16   Temp: 98.6 F (37 C) 97.8 F (36.6 C) (!) 97.4 F (36.3 C)   TempSrc: Oral Oral Oral   SpO2: 96% 97% 96%   Weight:   112.5 kg (248 lb 0.3 oz)   Height:        Intake/Output Summary (Last 24 hours) at 10/22/2017 1158 Last data filed at 10/22/2017 0100 Gross per 24 hour  Intake 350 ml  Output -  Net 350 ml   Filed Weights   10/21/17 0811 10/21/17 1120 10/22/17 0449  Weight: 111.2 kg (245 lb 2.4 oz) 109.2 kg (240 lb 11.9 oz) 112.5 kg (248 lb 0.3 oz)    Examination:  General exam: Not in distress, receiving hemodialysis Respiratory system: Bibasilar crackles and decreased breath sound, no wheezing Cardiovascular system: Regular rate rhythm S1-S2 normal.  Trace lower extremities edema. Gastrointestinal system: Soft, nontender.  Bowel sounds positive. Central nervous system: Alert awake and following commands. Extremities: dressing applied in left toe.   Data Reviewed: I have personally reviewed following labs and imaging studies  CBC: Recent Labs  Lab 10/19/17 0433 10/20/17 0352 10/21/17 0438 10/22/17 0415  WBC 17.2* 19.4* 23.5* 18.8*  HGB 9.0* 8.4* 8.4* 8.6*  HCT 28.0* 26.7* 26.5* 27.1*  MCV 87.2 86.7 86.0 87.4  PLT 560* 446* 413* 951*   Basic Metabolic Panel: Recent Labs  Lab 10/18/17 0507 10/19/17 0433 10/20/17 0352 10/21/17 0438 10/22/17 0415  NA 134* 134* 133* 134* 135  K 5.0 5.0 4.4 3.9 3.7  CL 102 102 101 100* 99*  CO2 15* 16* 15* 18* 23  GLUCOSE 212* 167* 140* 175* 134*  BUN 103* 121* 151* 92* 53*  CREATININE 3.98* 4.47* 4.36* 3.47* 2.76*  CALCIUM 8.7* 8.1* 7.6* 7.6* 7.9*  PHOS  --  7.5* 6.1* 4.0  --    GFR: Estimated Creatinine Clearance: 27.8 mL/min (A) (by C-G formula based on SCr of 2.76 mg/dL (H)). Liver Function Tests: Recent Labs  Lab 10/18/17 0507 10/19/17 0433 10/20/17 0352 10/21/17 0438  AST 13*  --   --   --   ALT 13*  --   --   --   ALKPHOS 75  --   --   --   BILITOT  0.5  --   --   --   PROT 7.0  --   --   --   ALBUMIN 2.8* 2.8* 2.7* 2.5*   No results for input(s): LIPASE, AMYLASE in the last 168 hours. No results for input(s): AMMONIA in the last 168 hours. Coagulation Profile: No results for input(s): INR, PROTIME in the last 168 hours. Cardiac Enzymes: Recent Labs  Lab 10/17/17 0426 10/18/17 0507 10/20/17 0352  CKTOTAL 109 93 195   BNP (last 3 results) No results for input(s): PROBNP in the last 8760 hours. HbA1C: No results for input(s): HGBA1C in the last 72 hours. CBG: No results for input(s): GLUCAP in the last 168 hours. Lipid Profile: No results for input(s): CHOL, HDL, LDLCALC, TRIG, CHOLHDL, LDLDIRECT in the last 72 hours. Thyroid Function Tests: No results for input(s): TSH, T4TOTAL, FREET4, T3FREE, THYROIDAB in the last 72 hours. Anemia Panel: No results for input(s): VITAMINB12, FOLATE, FERRITIN, TIBC, IRON, RETICCTPCT in the last 72 hours. Sepsis Labs: No results for input(s): PROCALCITON, LATICACIDVEN in the last 168 hours.  Recent Results (from the past 240 hour(s))  Culture, blood (routine x 2)     Status: None (Preliminary result)  Collection Time: 10/19/17  2:56 PM  Result Value Ref Range Status   Specimen Description BLOOD LEFT ANTECUBITAL  Final   Special Requests   Final    BOTTLES DRAWN AEROBIC AND ANAEROBIC Blood Culture results may not be optimal due to an inadequate volume of blood received in culture bottles   Culture NO GROWTH 2 DAYS  Final   Report Status PENDING  Incomplete  Culture, blood (routine x 2)     Status: None (Preliminary result)   Collection Time: 10/19/17  3:00 PM  Result Value Ref Range Status   Specimen Description BLOOD LEFT HAND  Final   Special Requests   Final    BOTTLES DRAWN AEROBIC AND ANAEROBIC Blood Culture adequate volume   Culture NO GROWTH 2 DAYS  Final   Report Status PENDING  Incomplete         Radiology Studies: No results found.      Scheduled Meds: .  aspirin EC  81 mg Oral Daily  . calcium acetate  667 mg Oral TID WC  . carvedilol  3.125 mg Oral BID WC  . [START ON 10/24/2017] darbepoetin (ARANESP) injection - DIALYSIS  60 mcg Intravenous Q Mon-HD  . furosemide  80 mg Oral BID  . gabapentin  100 mg Oral Daily  . hydrALAZINE  50 mg Oral BID  . isosorbide mononitrate  120 mg Oral Daily  . polyethylene glycol  17 g Oral Daily   Continuous Infusions: . cefTRIAXone (ROCEPHIN)  IV Stopped (10/21/17 1404)  . DAPTOmycin (CUBICIN)  IV Stopped (10/20/17 1807)     LOS: 12 days    Bonnell Public, MD [facility] Pager [phone]  If 7PM-7AM, please contact night-coverage [email or web address] Password [facility] 10/22/2017, 11:58 AM

## 2017-10-22 NOTE — Progress Notes (Signed)
Titrate up coreg today further for additional bp control. Diuretic deferred to renal. Consider cath when able from renal standpoint, perhaps Monday if it is decided to long term commit her to HD. WIll follow peripherally over the weekend, call with questions. Avoid hypotension with renal dysfunciton, but with intermittent chest pain episodes needs better control currently. Coreg favored over labetalol due to systolic dysfunction.    Zandra Abts MD

## 2017-10-23 LAB — RENAL FUNCTION PANEL
Albumin: 2.2 g/dL — ABNORMAL LOW (ref 3.5–5.0)
Anion gap: 13 (ref 5–15)
BUN: 55 mg/dL — ABNORMAL HIGH (ref 6–20)
CO2: 25 mmol/L (ref 22–32)
Calcium: 8.2 mg/dL — ABNORMAL LOW (ref 8.9–10.3)
Chloride: 99 mmol/L — ABNORMAL LOW (ref 101–111)
Creatinine, Ser: 2.75 mg/dL — ABNORMAL HIGH (ref 0.44–1.00)
GFR calc Af Amer: 20 mL/min — ABNORMAL LOW (ref >60)
GFR calc non Af Amer: 17 mL/min — ABNORMAL LOW (ref >60)
Glucose, Bld: 129 mg/dL — ABNORMAL HIGH (ref 65–99)
Phosphorus: 4.3 mg/dL (ref 2.5–4.6)
Potassium: 4.1 mmol/L (ref 3.5–5.1)
Sodium: 137 mmol/L (ref 135–145)

## 2017-10-23 NOTE — Progress Notes (Signed)
[facility] Progress Note    Assessment/ Plan:    1. Acute kidney injury on chronic kidney disease stage IV: Baseline kidney function not great. Suffered hemodynamically mediated AKI this hosp. UA was not c/w post-infectious GN/ Complements normal- held ARB- Avoided nephrotoxins/ Limited hypotensive episodes. Baseline crt at 2.4- peaked over 4- then down to 3.5- but then worse again with crt  over 4 again with BUN over 150. Dis not  have clear cut reason for most recent worsening , had non tuneled cath on 1/23 and first HD 1/24, second on 1/25- BUN improved from 150 to 92 then to 50s with very short low BFR dialysis so thought maybe some intrinsic renal function ?   holding over weekend and watch UOP and labs- so far so good- stayed stable last 24 hours.  Will not write HD orders for tomorrow- cont to assess.   If need to commit to HD- CLIP and make arrangements for perm access- but holding on those things right now   2.  Acute diastolic CHF: Takes Lasix 40 mg PO at home.    Since holding HD will add back lasix at 80 BID PO and watch-  BP  better-   2. Osteomyelitis second left toe/cellulitis: ongoing intravenous antibiotic therapy with ceftriaxone and daptomycin  3. Hypertension: BP better on hydral 50, coreg being titrated up and lasix 80 BID   5. Diabetes mellitus: Management per primary service.  6. Metabolic acidosis- previously on high dose bicarb- -stopped in the setting of  HD- still OK to hold  8. Anemia- hgb down-  iron stores low, repleting and giving ESA    9. Bones- phos high- started phoslo, improved- PTH 273- no vit D as of yet   Subjective:    Made 1400 of urine lasix 80 PO BID - numbers (BUN and crt) stayed the same- BP better - coreg being titrated up    Objective:   BP (!) 142/74 (BP Location: Left Arm)   Pulse (!) 119   Temp 98.2 F (36.8 C) (Oral)   Resp 14   Ht 5\' 8"  (1.727 m)   Wt 111.5 kg (245 lb 13 oz)   SpO2 95%   BMI 37.38 kg/m    Intake/Output Summary (Last 24 hours) at 10/23/2017 1025 Last data filed at 10/23/2017 3244 Gross per 24 hour  Intake 412 ml  Output 1451 ml  Net -1039 ml   Weight change: 0.3 kg (10.6 oz)  Physical Exam: Gen: sitting in bed, still a little SOB- seen on HD HEENT: +JVD CVS: RRR no m/r/g Resp: slightly increased WOB but lungs don't sound  coarse  Abd: soft, notender Ext: 1+ LE edema, RUE PICC in place- new right sided non tunneled HD cath placed 1/23 MSK: L foot with L 2nd toe osteomyelitis   Imaging: No results found.  Labs: BMET Recent Labs  Lab 10/17/17 0426 10/18/17 0507 10/19/17 0433 10/20/17 0352 10/21/17 0438 10/22/17 0415 10/23/17 0353  NA 135 134* 134* 133* 134* 135 137  K 5.4* 5.0 5.0 4.4 3.9 3.7 4.1  CL 104 102 102 101 100* 99* 99*  CO2 15* 15* 16* 15* 18* 23 25  GLUCOSE 152* 212* 167* 140* 175* 134* 129*  BUN 83* 103* 121* 151* 92* 53* 55*  CREATININE 3.80* 3.98* 4.47* 4.36* 3.47* 2.76* 2.75*  CALCIUM 8.7* 8.7* 8.1* 7.6* 7.6* 7.9* 8.2*  PHOS  --   --  7.5* 6.1* 4.0  --  4.3   CBC  Recent Labs  Lab 10/19/17 0433 10/20/17 0352 10/21/17 0438 10/22/17 0415  WBC 17.2* 19.4* 23.5* 18.8*  HGB 9.0* 8.4* 8.4* 8.6*  HCT 28.0* 26.7* 26.5* 27.1*  MCV 87.2 86.7 86.0 87.4  PLT 560* 446* 413* 427*    Medications:    . aspirin EC  81 mg Oral Daily  . calcium acetate  667 mg Oral TID WC  . carvedilol  6.25 mg Oral BID WC  . [START ON 10/24/2017] darbepoetin (ARANESP) injection - DIALYSIS  60 mcg Intravenous Q Mon-HD  . furosemide  80 mg Oral BID  . gabapentin  100 mg Oral Daily  . hydrALAZINE  50 mg Oral BID  . isosorbide mononitrate  120 mg Oral Daily  . polyethylene glycol  17 g Oral Daily      [facility] A  10/23/2017, 10:25 AM

## 2017-10-23 NOTE — Progress Notes (Signed)
PROGRESS NOTE    [facility]en [facility]  VOH:[id number] DOB: 1956/02/24 DOA: 10/10/2017 PCP: Martinique, Sarah T, MD   Brief Narrative: 62 year old female with history of peripheral vascular disease, chronic kidney disease stage IV with baseline creatinine around 2.4, recently diagnosed with left toe osteomyelitis at [facility] and discharged with IV daptomycin and ceftriaxone. Patient was receiving IV antibiotics daily at [facility] before transfer here. As per prior note, it was discussed with the surgery and no surgical procedure was recommended due to peripheral vascular disease. The plan was to follow-up with vascular and orthopedics as outpatient.  Patient was transferred to [facility] for worsening renal failure and for nephrology consult.    Placement of temporary dialysis catheter and initiation of hemodialysis on 1/23/219.  10/23/17 -Patient seen.  No no new complaints.  Patient is non-making urine.  Nephrology input is highly appreciated.  No plans for dialyzing patient in the morning for now.  We will continue to monitor renal function and electrolytes.  No nausea, no vomiting, and no shortness of breath.  Assessment & Plan:  #Acute kidney injury on CKD stage IV: -Patient had a worsening renal failure with some uremic symptoms including generalized weakness, poor sleep and shortness of breath.  Did not respond with IV diuretics. Temporary catheter placed on 10/19/2017 and started dialysis on 1/24.  Second dialysis on 1/25.  Tolerating well.  Plan to hold off dialysis for next few days.  Starting oral Lasix.  Watch for renal recovery.  Strict ins and out. -10/23/2017.  Patient has started to make urine.  BUN and serum creatinine have remained stable. -No hemodialysis planned for 10/24/2017 for now.  We will continue to monitor renal recovery.   UA has no protein and RBCs. Complements normal.  Renal ultrasound consistent with chronic kidney disease.   CK level normal, urine  eos negative, kappa lambda ratio normal.  #Anemia of chronic kidney disease: Continue IV iron and ESA during dialysis.  #Shortness of breath/acute pulmonary edema/acute systolic congestive heart failure: Chest x-ray with cardiomegaly with vascular congestion and edema.   -Elevated BNP.  started hemodialysis treatment. -Echocardiogram with EF of 35-40%.  -Continue aspirin, hydralazine, Imdur, Lasix. -Plan for ischemia evaluation, follow-up cardiology consult.  #History of mild intermittent asthma: Stable, no exacerbation.    #Left second toe osteomyelitis: As per prior progress note, she will follow-up with orthopedics outpatient and plan for vascular procedures. She was recently discharged from [facility] with IV daptomycin and ceftriaxone. As per patient she was getting IV antibiotics daily at the hospital and the plan is to continue until November 11, 2017. I have discussed with the case manager.The [facility] is requesting the prescription for antibiotics to resume after discharge. I consulted pharmacist.   -Follow-up blood culture, negatives so far. -spoke with pt's daughter who was concerned about pt's pain. Ordered oxycodone as needed and miralax.   #Type 2 diabetes: Continue to monitor blood sugar level. A1c 5.5.  #Hypertension: Blood pressure elevated likely contributed by volume.  Receiving second dialysis today.  Starting Lasix.  Continue hydralazine and Imdur.  Added IV hydralazine as needed for the management of uncontrolled hypertension.  May need to resume labetalol depending on blood pressure monitoring.  #Physical deconditioning: PT OT evaluation.   DVT prophylaxis: SCD.  Code Status: Full code Family Communication:  Disposition Plan: Home eventually.   Consultants:   Nephrology  Cardiology  Procedures: None Antimicrobials: On daptomycin and IV ceftriaxone since before admission  Subjective: No new complaints.  No shortness of breath. No  chest pain. Patient is not making urine.  Objective: Vitals:   10/22/17 1813 10/22/17 2124 10/22/17 2156 10/23/17 0446  BP: (!) 477/66 (!) 157/66 (!) 153/65 (!) 142/74  Pulse: (!) 118  (!) 112 (!) 119  Resp:   18 14  Temp:   97.8 F (36.6 C) 98.2 F (36.8 C)  TempSrc:   Oral Oral  SpO2:   96% 95%  Weight:    111.5 kg (245 lb 13 oz)  Height:        Intake/Output Summary (Last 24 hours) at 10/23/2017 0931 Last data filed at 10/23/2017 3009 Gross per 24 hour  Intake 412 ml  Output 1451 ml  Net -1039 ml   Filed Weights   10/21/17 1120 10/22/17 0449 10/23/17 0446  Weight: 109.2 kg (240 lb 11.9 oz) 112.5 kg (248 lb 0.3 oz) 111.5 kg (245 lb 13 oz)    Examination:  General exam: Not in distress. Respiratory system: Clear to auscultation.   Cardiovascular system: S1-S2 normal.   Gastrointestinal system: Soft, nontender.  Bowel sounds positive. Central nervous system: Alert awake and following commands. Extremities: No leg edema..   Data Reviewed: I have personally reviewed following labs and imaging studies  CBC: Recent Labs  Lab 10/19/17 0433 10/20/17 0352 10/21/17 0438 10/22/17 0415  WBC 17.2* 19.4* 23.5* 18.8*  HGB 9.0* 8.4* 8.4* 8.6*  HCT 28.0* 26.7* 26.5* 27.1*  MCV 87.2 86.7 86.0 87.4  PLT 560* 446* 413* 233*   Basic Metabolic Panel: Recent Labs  Lab 10/19/17 0433 10/20/17 0352 10/21/17 0438 10/22/17 0415 10/23/17 0353  NA 134* 133* 134* 135 137  K 5.0 4.4 3.9 3.7 4.1  CL 102 101 100* 99* 99*  CO2 16* 15* 18* 23 25  GLUCOSE 167* 140* 175* 134* 129*  BUN 121* 151* 92* 53* 55*  CREATININE 4.47* 4.36* 3.47* 2.76* 2.75*  CALCIUM 8.1* 7.6* 7.6* 7.9* 8.2*  PHOS 7.5* 6.1* 4.0  --  4.3   GFR: Estimated Creatinine Clearance: 27.8 mL/min (A) (by C-G formula based on SCr of 2.75 mg/dL (H)). Liver Function Tests: Recent Labs  Lab 10/18/17 0507 10/19/17 0433 10/20/17 0352 10/21/17 0438 10/23/17 0353  AST 13*  --   --   --   --   ALT 13*  --   --   --    --   ALKPHOS 75  --   --   --   --   BILITOT 0.5  --   --   --   --   PROT 7.0  --   --   --   --   ALBUMIN 2.8* 2.8* 2.7* 2.5* 2.2*   No results for input(s): LIPASE, AMYLASE in the last 168 hours. No results for input(s): AMMONIA in the last 168 hours. Coagulation Profile: No results for input(s): INR, PROTIME in the last 168 hours. Cardiac Enzymes: Recent Labs  Lab 10/17/17 0426 10/18/17 0507 10/20/17 0352  CKTOTAL 109 93 195   BNP (last 3 results) No results for input(s): PROBNP in the last 8760 hours. HbA1C: No results for input(s): HGBA1C in the last 72 hours. CBG: No results for input(s): GLUCAP in the last 168 hours. Lipid Profile: No results for input(s): CHOL, HDL, LDLCALC, TRIG, CHOLHDL, LDLDIRECT in the last 72 hours. Thyroid Function Tests: No results for input(s): TSH, T4TOTAL, FREET4, T3FREE, THYROIDAB in the last 72 hours. Anemia Panel: No results for input(s): VITAMINB12, FOLATE, FERRITIN, TIBC, IRON, RETICCTPCT in the last  72 hours. Sepsis Labs: No results for input(s): PROCALCITON, LATICACIDVEN in the last 168 hours.  Recent Results (from the past 240 hour(s))  Culture, blood (routine x 2)     Status: None (Preliminary result)   Collection Time: 10/19/17  2:56 PM  Result Value Ref Range Status   Specimen Description BLOOD LEFT ANTECUBITAL  Final   Special Requests   Final    BOTTLES DRAWN AEROBIC AND ANAEROBIC Blood Culture results may not be optimal due to an inadequate volume of blood received in culture bottles   Culture NO GROWTH 3 DAYS  Final   Report Status PENDING  Incomplete  Culture, blood (routine x 2)     Status: None (Preliminary result)   Collection Time: 10/19/17  3:00 PM  Result Value Ref Range Status   Specimen Description BLOOD LEFT HAND  Final   Special Requests   Final    BOTTLES DRAWN AEROBIC AND ANAEROBIC Blood Culture adequate volume   Culture NO GROWTH 3 DAYS  Final   Report Status PENDING  Incomplete         Radiology  Studies: No results found.      Scheduled Meds: . aspirin EC  81 mg Oral Daily  . calcium acetate  667 mg Oral TID WC  . carvedilol  6.25 mg Oral BID WC  . [START ON 10/24/2017] darbepoetin (ARANESP) injection - DIALYSIS  60 mcg Intravenous Q Mon-HD  . furosemide  80 mg Oral BID  . gabapentin  100 mg Oral Daily  . hydrALAZINE  50 mg Oral BID  . isosorbide mononitrate  120 mg Oral Daily  . polyethylene glycol  17 g Oral Daily   Continuous Infusions: . cefTRIAXone (ROCEPHIN)  IV Stopped (10/22/17 1600)  . DAPTOmycin (CUBICIN)  IV 680 mg (10/22/17 1813)     LOS: 13 days    Bonnell Public, MD [facility] Pager [phone]  If 7PM-7AM, please contact night-coverage [email or web address] Password [facility] 10/23/2017, 9:31 AM

## 2017-10-24 ENCOUNTER — Inpatient Hospital Stay (HOSPITAL_COMMUNITY): Payer: Medicare HMO

## 2017-10-24 LAB — RENAL FUNCTION PANEL
Albumin: 2.4 g/dL — ABNORMAL LOW (ref 3.5–5.0)
Anion gap: 15 (ref 5–15)
BUN: 58 mg/dL — ABNORMAL HIGH (ref 6–20)
CO2: 24 mmol/L (ref 22–32)
Calcium: 8.3 mg/dL — ABNORMAL LOW (ref 8.9–10.3)
Chloride: 95 mmol/L — ABNORMAL LOW (ref 101–111)
Creatinine, Ser: 2.89 mg/dL — ABNORMAL HIGH (ref 0.44–1.00)
GFR calc Af Amer: 19 mL/min — ABNORMAL LOW (ref >60)
GFR calc non Af Amer: 16 mL/min — ABNORMAL LOW (ref >60)
Glucose, Bld: 127 mg/dL — ABNORMAL HIGH (ref 65–99)
Phosphorus: 4.1 mg/dL (ref 2.5–4.6)
Potassium: 4.1 mmol/L (ref 3.5–5.1)
Sodium: 134 mmol/L — ABNORMAL LOW (ref 135–145)

## 2017-10-24 LAB — CBC
HCT: 29.3 % — ABNORMAL LOW (ref 36.0–46.0)
Hemoglobin: 9.3 g/dL — ABNORMAL LOW (ref 12.0–15.0)
MCH: 29.2 pg (ref 26.0–34.0)
MCHC: 31.7 g/dL (ref 30.0–36.0)
MCV: 91.8 fL (ref 78.0–100.0)
Platelets: 452 10*3/uL — ABNORMAL HIGH (ref 150–400)
RBC: 3.19 MIL/uL — ABNORMAL LOW (ref 3.87–5.11)
RDW: 19.2 % — ABNORMAL HIGH (ref 11.5–15.5)
WBC: 16.2 10*3/uL — ABNORMAL HIGH (ref 4.0–10.5)

## 2017-10-24 LAB — CULTURE, BLOOD (ROUTINE X 2)
Culture: NO GROWTH
Culture: NO GROWTH
Special Requests: ADEQUATE

## 2017-10-24 MED ORDER — CARVEDILOL 12.5 MG PO TABS
12.5000 mg | ORAL_TABLET | Freq: Two times a day (BID) | ORAL | Status: DC
  Administered 2017-10-24 – 2017-11-01 (×12): 12.5 mg via ORAL
  Filled 2017-10-24 (×14): qty 1

## 2017-10-24 MED ORDER — NYSTATIN 100000 UNIT/GM EX POWD
Freq: Two times a day (BID) | CUTANEOUS | Status: AC
  Administered 2017-10-24 – 2017-10-26 (×5): via TOPICAL
  Filled 2017-10-24: qty 15

## 2017-10-24 MED ORDER — HYDRALAZINE HCL 50 MG PO TABS
50.0000 mg | ORAL_TABLET | Freq: Three times a day (TID) | ORAL | Status: DC
  Administered 2017-10-24 – 2017-10-28 (×9): 50 mg via ORAL
  Filled 2017-10-24 (×9): qty 1

## 2017-10-24 MED ORDER — HEPARIN SODIUM (PORCINE) 5000 UNIT/ML IJ SOLN
5000.0000 [IU] | Freq: Two times a day (BID) | INTRAMUSCULAR | Status: DC
  Administered 2017-10-24 – 2017-10-28 (×9): 5000 [IU] via SUBCUTANEOUS
  Filled 2017-10-24 (×9): qty 1

## 2017-10-24 MED ORDER — FUROSEMIDE 10 MG/ML IJ SOLN
100.0000 mg | Freq: Two times a day (BID) | INTRAVENOUS | Status: DC
  Administered 2017-10-24 – 2017-10-25 (×2): 100 mg via INTRAVENOUS
  Filled 2017-10-24 (×3): qty 10

## 2017-10-24 NOTE — Progress Notes (Signed)
Occupational Therapy Treatment Patient Details Name: Kristen Berry MRN: [id number] DOB: July 17, 1956 Today's Date: 10/24/2017    History of present illness Patient is a 62 year old female who presented to the hospital with shortness of breath. She was found to have an acute kidney injury. PMH: PVD, HTN, CHF CKD, OA    OT comments  Pt progressing towards OT goals; requires ModA for sit<>stand and completing room level functional mobility and standing grooming ADLs at RW level with MinGuard assist. Pt fatigues quickly but is motivated to work and progress with therapy. Continue to recommend [facility] after discharge home to maximize her safety and independence with ADLs and mobility. Will continue to follow acutely.    Follow Up Recommendations  Home health OT;Supervision/Assistance - 24 hour    Equipment Recommendations  None recommended by OT          Precautions / Restrictions Precautions Precautions: Fall Restrictions Weight Bearing Restrictions: No       Mobility Bed Mobility               General bed mobility comments: Pt was received sitting up in the chair  Transfers Overall transfer level: Needs assistance Equipment used: Rolling walker (2 wheeled) Transfers: Sit to/from Stand Sit to Stand: Mod assist         General transfer comment: Assist to boost into standing at RW, stood from recliner; verbal cues for hand placement     Balance Overall balance assessment: Needs assistance Sitting-balance support: Feet supported;No upper extremity supported Sitting balance-Leahy Scale: Good     Standing balance support: Bilateral upper extremity supported;Single extremity supported;During functional activity Standing balance-Leahy Scale: Poor Standing balance comment: Relies on UE support; utilizing single UE support during grooming tasks                            ADL either performed or assessed with clinical judgement   ADL Overall ADL's : Needs  assistance/impaired     Grooming: Wash/dry face;Oral care;Minimal assistance;Min guard;Standing                               Functional mobility during ADLs: Min guard;Rolling walker General ADL Comments: Pt completing room level functional mobility at RW level and standing grooming ADLs; Pt on 3L O2 during session completion                        Cognition Arousal/Alertness: Awake/alert Behavior During Therapy: WFL for tasks assessed/performed(tearful at times ) Overall Cognitive Status: Within Functional Limits for tasks assessed                                                            Pertinent Vitals/ Pain       Pain Assessment: Faces Faces Pain Scale: Hurts a little bit Pain Location: general discomfort, chest area Pain Descriptors / Indicators: Discomfort Pain Intervention(s): Monitored during session  Frequency  Min 2X/week        Progress Toward Goals  OT Goals(current goals can now be found in the care plan section)  Progress towards OT goals: Progressing toward goals  Acute Rehab OT Goals Patient Stated Goal: Feel stronger - reports feeling weak from HD today OT Goal Formulation: With patient Time For Goal Achievement: 10/31/17 Potential to Achieve Goals: Good  Plan Discharge plan remains appropriate                     AM-PAC PT "6 Clicks" Daily Activity     Outcome Measure   Help from another person eating meals?: A Little Help from another person taking care of personal grooming?: A Little Help from another person toileting, which includes using toliet, bedpan, or urinal?: A Little Help from another person bathing (including washing, rinsing, drying)?: A Little Help from another person to put on and taking off regular upper body clothing?: A Little Help from another person to put on and taking off regular lower  body clothing?: A Little 6 Click Score: 18    End of Session Equipment Utilized During Treatment: Oxygen;Gait belt;Rolling walker  OT Visit Diagnosis: Other abnormalities of gait and mobility (R26.89)   Activity Tolerance Patient tolerated treatment well   Patient Left in chair;with call bell/phone within reach   Nurse Communication Mobility status        Time: 1535-1600 OT Time Calculation (min): 25 min  Charges: OT General Charges $OT Visit: 1 Visit OT Treatments $Self Care/Home Management : 8-22 mins  [name], OT Pager [phone] 10/24/2017    [name] 10/24/2017, 4:03 PM

## 2017-10-24 NOTE — Progress Notes (Signed)
Pt perianal area and abdominal folds skin redness noted will order barrier cream and powder.

## 2017-10-24 NOTE — Progress Notes (Signed)
Physical Therapy Treatment Patient Details Name: Kristen Berry MRN: [id number] DOB: [birth year] Today's Date: 10/24/2017    History of Present Illness Patient is a 62 year old female who presented to the hospital with shortness of breath. She was found to have an acute kidney injury. PMH: PVD, HTN, CHF CKD, OA     PT Comments    Pt progressing towards physical therapy goals. Pt emotional and tearful throughout session, which may have limited pt's motivation for participation with mobility. Focus of session was to void in the bathroom, and up to mod assist was provided for power-up to full standing to get off the toilet. Pt was educated on the benefits of a toileting schedule to minimize incontinent episodes due to trying to hold her urine, and frequent short walks to improve cardiovascular  endurance. Will continue to follow.  Follow Up Recommendations  Home health PT     Equipment Recommendations  Rolling walker with 5" wheels    Recommendations for Other Services       Precautions / Restrictions Precautions Precautions: Fall Restrictions Weight Bearing Restrictions: No    Mobility  Bed Mobility               General bed mobility comments: Pt was received sitting up in the chair  Transfers Overall transfer level: Needs assistance Equipment used: Rolling walker (2 wheeled) Transfers: Sit to/from Stand Sit to Stand: Mod assist         General transfer comment: From recliner and toilet. Pt required VC's for hand palcement on seated surface for safety (also pulling up to stand from vertical railing in bathroom. Pt also cued for anterior lean strong hip extension to achieve full stand.   Ambulation/Gait Ambulation/Gait assistance: Min guard Ambulation Distance (Feet): 25 Feet Assistive device: Rolling walker (2 wheeled) Gait Pattern/deviations: Step-through pattern Gait velocity: Decreased Gait velocity interpretation: Below normal speed for age/gender General Gait  Details: In room only. Pt ambulating slow and guarded with RW for support. General VC's for posture and safety. Pt reports not being able to tolerate more distance due to dizziness and fatigue.    Stairs            Wheelchair Mobility    Modified Rankin (Stroke Patients Only)       Balance Overall balance assessment: Needs assistance Sitting-balance support: Feet supported;No upper extremity supported Sitting balance-Leahy Scale: Good     Standing balance support: Bilateral upper extremity supported Standing balance-Leahy Scale: Poor Standing balance comment: Relies on B UE support                            Cognition Arousal/Alertness: Awake/alert Behavior During Therapy: WFL for tasks assessed/performed(Emotional/tearful) Overall Cognitive Status: Within Functional Limits for tasks assessed                                        Exercises      General Comments        Pertinent Vitals/Pain Pain Assessment: Faces Faces Pain Scale: Hurts a little bit Pain Location: General discomfort with mobility - states she is stiff from not moving much today Pain Descriptors / Indicators: Discomfort Pain Intervention(s): Monitored during session    Home Living                      Prior Function  PT Goals (current goals can now be found in the care plan section) Acute Rehab PT Goals PT Goal Formulation: With patient Time For Goal Achievement: 11/07/17 Potential to Achieve Goals: Good Progress towards PT goals: Progressing toward goals    Frequency    Min 3X/week      PT Plan Current plan remains appropriate    Co-evaluation              AM-PAC PT "6 Clicks" Daily Activity  Outcome Measure  Difficulty turning over in bed (including adjusting bedclothes, sheets and blankets)?: None Difficulty moving from lying on back to sitting on the side of the bed? : A Little Difficulty sitting down on and standing  up from a chair with arms (e.g., wheelchair, bedside commode, etc,.)?: A Little Help needed moving to and from a bed to chair (including a wheelchair)?: A Little Help needed walking in hospital room?: A Little Help needed climbing 3-5 steps with a railing? : A Little 6 Click Score: 19    End of Session Equipment Utilized During Treatment: Gait belt;Oxygen Activity Tolerance: Patient limited by fatigue Patient left: in bed;with call bell/phone within reach;with bed alarm set Nurse Communication: Mobility status PT Visit Diagnosis: Unsteadiness on feet (R26.81);Difficulty in walking, not elsewhere classified (R26.2)     Time: 5501-5868 PT Time Calculation (min) (ACUTE ONLY): 30 min  Charges:  $Gait Training: 23-37 mins                    G Codes:       Kristen Berry, PT, DPT Acute Rehabilitation Services Pager: [phone]    Kristen Berry 10/24/2017, 3:21 PM

## 2017-10-24 NOTE — Progress Notes (Signed)
Assessment/ Plan:    1. Acute kidney injury on chronic kidney disease stage(cr 2.4) JO:[id number] hemodynamically mediated AKI this hosp.  2.  Acute diastolic CHF: On furosemide PO, weight up so change to IV, needs more volume off  2. Osteomyelitis second left toe/cellulitis: ongoing intravenous antibiotic therapy with ceftriaxone and daptomycin   Subjective: Interval History: c/o SOB  Objective: Vital signs in last 24 hours: Temp:  [97.5 F (36.4 C)-98.4 F (36.9 C)] 97.5 F (36.4 C) (01/28 1300) Pulse Rate:  [74-86] 86 (01/28 1300) Resp:  [18] 18 (01/28 1300) BP: (159-162)/(63-77) 162/77 (01/28 1300) SpO2:  [94 %-96 %] 96 % (01/28 1300) Weight:  [112.4 kg (247 lb 12.8 oz)] 112.4 kg (247 lb 12.8 oz) (01/28 0431) Weight change: 0.9 kg (1 lb 15.7 oz)  Intake/Output from previous day: 01/27 0701 - 01/28 0700 In: 836.6 [P.O.:663; I.V.:10; IV Piggyback:163.6] Out: 700 [Urine:700] Intake/Output this shift: No intake/output data recorded.  General appearance: alert and cooperative Resp: diminished breath sounds bilaterally Chest wall: no tenderness Cardio: regular rate and rhythm, S1, S2 normal, no murmur, click, rub or gallop Extremities: edema 1+  Lab Results: Recent Labs    10/22/17 0415 10/24/17 0419  WBC 18.8* 16.2*  HGB 8.6* 9.3*  HCT 27.1* 29.3*  PLT 427* 452*   BMET:  Recent Labs    10/23/17 0353 10/24/17 0419  NA 137 134*  K 4.1 4.1  CL 99* 95*  CO2 25 24  GLUCOSE 129* 127*  BUN 55* 58*  CREATININE 2.75* 2.89*  CALCIUM 8.2* 8.3*   No results for input(s): PTH in the last 72 hours. Iron Studies: No results for input(s): IRON, TIBC, TRANSFERRIN, FERRITIN in the last 72 hours. Studies/Results: No results found.  Scheduled: . aspirin EC  81 mg Oral Daily  . calcium acetate  667 mg Oral TID WC  . carvedilol  6.25 mg Oral BID WC  . darbepoetin (ARANESP) injection - DIALYSIS  60 mcg Intravenous Q Mon-HD  . furosemide  80 mg Oral BID  . gabapentin   100 mg Oral Daily  . heparin injection (subcutaneous)  5,000 Units Subcutaneous Q12H  . hydrALAZINE  50 mg Oral BID  . isosorbide mononitrate  120 mg Oral Daily  . nystatin   Topical BID  . polyethylene glycol  17 g Oral Daily     LOS: 14 days   Estanislado Emms 10/24/2017,3:01 PM

## 2017-10-24 NOTE — Progress Notes (Signed)
Progress Note  Patient Name: Kristen Berry Date of Encounter: 10/24/2017  Primary Cardiologist: Dr. Tamala Julian  Subjective   No chest pain. Breathing feels better.   Inpatient Medications    Scheduled Meds: . aspirin EC  81 mg Oral Daily  . calcium acetate  667 mg Oral TID WC  . carvedilol  6.25 mg Oral BID WC  . darbepoetin (ARANESP) injection - DIALYSIS  60 mcg Intravenous Q Mon-HD  . furosemide  80 mg Oral BID  . gabapentin  100 mg Oral Daily  . heparin injection (subcutaneous)  5,000 Units Subcutaneous Q12H  . hydrALAZINE  50 mg Oral BID  . isosorbide mononitrate  120 mg Oral Daily  . nystatin   Topical BID  . polyethylene glycol  17 g Oral Daily   Continuous Infusions: . cefTRIAXone (ROCEPHIN)  IV Stopped (10/23/17 1512)  . DAPTOmycin (CUBICIN)  IV 680 mg (10/22/17 1813)   PRN Meds: acetaminophen **OR** acetaminophen, camphor-menthol **AND** hydrOXYzine, docusate sodium, feeding supplement (NEPRO CARB STEADY), hydrALAZINE, ipratropium-albuterol, lidocaine, nitroGLYCERIN, ondansetron **OR** ondansetron (ZOFRAN) IV, oxyCODONE, sodium chloride flush, sorbitol, zolpidem   Vital Signs    Vitals:   10/23/17 1236 10/23/17 2123 10/23/17 2201 10/24/17 0431  BP: (!) 155/55 (!) 160/63 (!) 159/70 (!) 159/63  Pulse: 78 74  82  Resp: 18 18  18   Temp: 98.1 F (36.7 C) 98.1 F (36.7 C)  98.4 F (36.9 C)  TempSrc: Oral Oral  Oral  SpO2: 96% 94%  94%  Weight:    247 lb 12.8 oz (112.4 kg)  Height:        Intake/Output Summary (Last 24 hours) at 10/24/2017 0915 Last data filed at 10/24/2017 0500 Gross per 24 hour  Intake 836.6 ml  Output 700 ml  Net 136.6 ml   Filed Weights   10/22/17 0449 10/23/17 0446 10/24/17 0431  Weight: 248 lb 0.3 oz (112.5 kg) 245 lb 13 oz (111.5 kg) 247 lb 12.8 oz (112.4 kg)    Telemetry    Sinus - Personally Reviewed  ECG    No AM EKG  Physical Exam   General: Well developed, well nourished, NAD  HEENT: OP clear, mucus membranes moist    SKIN: warm, dry. No rashes. Neuro: No focal deficits  Musculoskeletal: Muscle strength 5/5 all ext  Psychiatric: Mood and affect normal  Neck: No JVD, no carotid bruits, no thyromegaly, no lymphadenopathy.  Lungs:Clear bilaterally, no wheezes, rhonci, crackles Cardiovascular: Regular rate and rhythm. No murmurs, gallops or rubs. Abdomen:Soft. Bowel sounds present. Non-tender.  Extremities: No lower extremity edema. Pulses are 2 + in the bilateral DP/PT.    Labs    Chemistry Recent Labs  Lab 10/18/17 0507  10/21/17 0438 10/22/17 0415 10/23/17 0353 10/24/17 0419  NA 134*   < > 134* 135 137 134*  K 5.0   < > 3.9 3.7 4.1 4.1  CL 102   < > 100* 99* 99* 95*  CO2 15*   < > 18* 23 25 24   GLUCOSE 212*   < > 175* 134* 129* 127*  BUN 103*   < > 92* 53* 55* 58*  CREATININE 3.98*   < > 3.47* 2.76* 2.75* 2.89*  CALCIUM 8.7*   < > 7.6* 7.9* 8.2* 8.3*  PROT 7.0  --   --   --   --   --   ALBUMIN 2.8*   < > 2.5*  --  2.2* 2.4*  AST 13*  --   --   --   --   --  ALT 13*  --   --   --   --   --   ALKPHOS 75  --   --   --   --   --   BILITOT 0.5  --   --   --   --   --   GFRNONAA 11*   < > 13* 17* 17* 16*  GFRAA 13*   < > 15* 20* 20* 19*  ANIONGAP 17*   < > 16* 13 13 15    < > = values in this interval not displayed.     Hematology Recent Labs  Lab 10/21/17 0438 10/22/17 0415 10/24/17 0419  WBC 23.5* 18.8* 16.2*  RBC 3.08* 3.10* 3.19*  HGB 8.4* 8.6* 9.3*  HCT 26.5* 27.1* 29.3*  MCV 86.0 87.4 91.8  MCH 27.3 27.7 29.2  MCHC 31.7 31.7 31.7  RDW 18.3* 18.9* 19.2*  PLT 413* 427* 452*    Cardiac EnzymesNo results for input(s): TROPONINI in the last 168 hours. No results for input(s): TROPIPOC in the last 168 hours.   BNP Recent Labs  Lab 10/17/17 1911  BNP 2,204.7*     DDimer No results for input(s): DDIMER in the last 168 hours.   Radiology    No results found.  Cardiac Studies   TTE: 10/17/17  Study Conclusions  - Left ventricle: Septal and apical akinesis.  The cavity size was moderately dilated. Wall thickness was increased in a pattern of mild LVH. Systolic function was moderately reduced. The estimated ejection fraction was in the range of 35% to 40%. Doppler parameters are consistent with both elevated ventricular end-diastolic filling pressure and elevated left atrial filling pressure. - Mitral valve: Moderately calcified, moderately thickened annulus. Moderately thickened leaflets . The findings are consistent with mild stenosis. There was moderate regurgitation. Valve area by continuity equation (using LVOT flow): 1.5 cm^2. - Left atrium: The atrium was mildly dilated. - Atrial septum: No defect or patent foramen ovale was identified. - Pulmonary arteries: PA peak pressure: 55 mm Hg (S).  Patient Profile     62 y.o. female with PMH of systolic HF, HTN, HL, CKD IV, osteomyelitis and PVD who presented with worsening renal function. Developed acute respiratory distress and acute renal failure. Echo 10/17/17 with LVEF=35-40%.   Assessment & Plan    1. Acute on chronic systolic HO:[id number] underlying CAD. Cardiac cath would be risky at this time as it is not certain that she will be committed to long term HD. She is back on Lasix. We will follow along with you this week.    For questions or updates, please contact [place] Please consult [email or web address] for contact info under Cardiology/STEMI.      Signed, Lauree Chandler, MD  10/24/2017, 9:15 AM

## 2017-10-24 NOTE — Progress Notes (Signed)
PROGRESS NOTE    Kristen Berry  BJY:[id number] DOB: 13-Jul-1956 DOA: 10/10/2017 PCP: Martinique, Sarah T, MD   Brief Narrative: Patient is a 63 year old female with history of peripheral vascular disease, chronic kidney disease stage IV with baseline creatinine around 2.4, recently diagnosed with left toe osteomyelitis at [facility] and discharged with IV daptomycin and ceftriaxone. Patient was receiving IV antibiotics daily at [facility] before transfer here. As per prior note, it was discussed with the surgery and no surgical procedure was recommended due to peripheral vascular disease. The plan was to follow-up with vascular and orthopedics as outpatient.  Patient was transferred to [facility] due to worsening renal failure and for nephrology consultation.    Placement of temporary dialysis catheter and initiation of hemodialysis on 10/19/2017.  10/24/17 -Patient reported making urine overnight. No new complaints. No SOB or chest pain. No plans for HD today. Nephrology input is highly appreciated.    Assessment & Plan:  #Acute kidney injury on CKD stage IV: -Patient had a worsening renal failure with some uremic symptoms including generalized weakness, poor sleep and shortness of breath.  Did not respond with IV diuretics. Temporary catheter placed on 10/19/2017 and started dialysis on 1/24.  Second dialysis on 1/25.   - Plan is to hold off dialysis for next few days.   - PO Lasix changed to IV.  Strict ins and out. - Patient has continued to make urine. - No significant change in Scr.   UA has no protein and RBCs. Complements normal.  Renal ultrasound consistent with chronic kidney disease.   CK level normal, urine eos negative, kappa lambda ratio normal.  #Anemia of chronic kidney disease: Continue IV iron and ESA during dialysis.  #Shortness of breath/acute pulmonary edema/acute systolic congestive heart failure: Chest x-ray with cardiomegaly with vascular  congestion and edema.   -Elevated BNP.  started hemodialysis treatment. -Echocardiogram with EF of 35-40%.  -Continue aspirin, hydralazine, Imdur, Lasix. -Plan for ischemia evaluation, follow-up cardiology consult. - SOB has resolved.  #History of mild intermittent asthma: Stable, no exacerbation.    #Left second toe osteomyelitis: - Complete course of antibiotics (IV Rocephin and Daptomycin until 11/11/2017). - Follow up with orthopedics on discharge as there are plans for vascular procedures.  -Follow-up blood culture, negatives so far.  #Type 2 diabetes:  -Continue to monitor blood sugar level.  - HbA1c 5.5.  #Hypertension:  - Increase coreg from 6.25mg  po bid to 12.5mg  po bid - Increase Hydralazine from 50mg  po bid to 50mg  po Tid - Continue other medication. - Continue to optimize BP control. - Hopefully, BP will continue to be optimized with improvement in volume status.   #Physical deconditioning: PT OT evaluation.   DVT prophylaxis: SCD.  Code Status: Full code Family Communication:  Disposition Plan: Home eventually.   Consultants:   Nephrology  Cardiology  Procedures: None Antimicrobials: On daptomycin and IV ceftriaxone since before admission  Subjective: No new complaints. No shortness of breath. No chest pain. Patient is making urine.  Objective: Vitals:   10/23/17 2123 10/23/17 2201 10/24/17 0431 10/24/17 1300  BP: (!) 160/63 (!) 159/70 (!) 159/63 (!) 162/77  Pulse: 74  82 86  Resp: 18  18 18   Temp: 98.1 F (36.7 C)  98.4 F (36.9 C) (!) 97.5 F (36.4 C)  TempSrc: Oral  Oral Oral  SpO2: 94%  94% 96%  Weight:   112.4 kg (247 lb 12.8 oz)   Height:  Intake/Output Summary (Last 24 hours) at 10/24/2017 1821 Last data filed at 10/24/2017 1723 Gross per 24 hour  Intake 1396.6 ml  Output 700 ml  Net 696.6 ml   Filed Weights   10/22/17 0449 10/23/17 0446 10/24/17 0431  Weight: 112.5 kg (248 lb 0.3 oz) 111.5 kg (245 lb 13 oz) 112.4 kg  (247 lb 12.8 oz)    Examination:  General exam: Not in distress. Respiratory system: Decreased air entry.   Cardiovascular system: S1-S2 normal.   Gastrointestinal system: Soft, nontender.  Bowel sounds positive. Central nervous system: Alert awake and following commands. Extremities: Mild leg edema..   Data Reviewed: I have personally reviewed following labs and imaging studies  CBC: Recent Labs  Lab 10/19/17 0433 10/20/17 0352 10/21/17 0438 10/22/17 0415 10/24/17 0419  WBC 17.2* 19.4* 23.5* 18.8* 16.2*  HGB 9.0* 8.4* 8.4* 8.6* 9.3*  HCT 28.0* 26.7* 26.5* 27.1* 29.3*  MCV 87.2 86.7 86.0 87.4 91.8  PLT 560* 446* 413* 427* 176*   Basic Metabolic Panel: Recent Labs  Lab 10/19/17 0433 10/20/17 0352 10/21/17 0438 10/22/17 0415 10/23/17 0353 10/24/17 0419  NA 134* 133* 134* 135 137 134*  K 5.0 4.4 3.9 3.7 4.1 4.1  CL 102 101 100* 99* 99* 95*  CO2 16* 15* 18* 23 25 24   GLUCOSE 167* 140* 175* 134* 129* 127*  BUN 121* 151* 92* 53* 55* 58*  CREATININE 4.47* 4.36* 3.47* 2.76* 2.75* 2.89*  CALCIUM 8.1* 7.6* 7.6* 7.9* 8.2* 8.3*  PHOS 7.5* 6.1* 4.0  --  4.3 4.1   GFR: Estimated Creatinine Clearance: 26.5 mL/min (A) (by C-G formula based on SCr of 2.89 mg/dL (H)). Liver Function Tests: Recent Labs  Lab 10/18/17 0507 10/19/17 0433 10/20/17 0352 10/21/17 0438 10/23/17 0353 10/24/17 0419  AST 13*  --   --   --   --   --   ALT 13*  --   --   --   --   --   ALKPHOS 75  --   --   --   --   --   BILITOT 0.5  --   --   --   --   --   PROT 7.0  --   --   --   --   --   ALBUMIN 2.8* 2.8* 2.7* 2.5* 2.2* 2.4*   No results for input(s): LIPASE, AMYLASE in the last 168 hours. No results for input(s): AMMONIA in the last 168 hours. Coagulation Profile: No results for input(s): INR, PROTIME in the last 168 hours. Cardiac Enzymes: Recent Labs  Lab 10/18/17 0507 10/20/17 0352  CKTOTAL 93 195   BNP (last 3 results) No results for input(s): PROBNP in the last 8760  hours. HbA1C: No results for input(s): HGBA1C in the last 72 hours. CBG: No results for input(s): GLUCAP in the last 168 hours. Lipid Profile: No results for input(s): CHOL, HDL, LDLCALC, TRIG, CHOLHDL, LDLDIRECT in the last 72 hours. Thyroid Function Tests: No results for input(s): TSH, T4TOTAL, FREET4, T3FREE, THYROIDAB in the last 72 hours. Anemia Panel: No results for input(s): VITAMINB12, FOLATE, FERRITIN, TIBC, IRON, RETICCTPCT in the last 72 hours. Sepsis Labs: No results for input(s): PROCALCITON, LATICACIDVEN in the last 168 hours.  Recent Results (from the past 240 hour(s))  Culture, blood (routine x 2)     Status: None   Collection Time: 10/19/17  2:56 PM  Result Value Ref Range Status   Specimen Description BLOOD LEFT ANTECUBITAL  Final   Special  Requests   Final    BOTTLES DRAWN AEROBIC AND ANAEROBIC Blood Culture results may not be optimal due to an inadequate volume of blood received in culture bottles   Culture NO GROWTH 5 DAYS  Final   Report Status 10/24/2017 FINAL  Final  Culture, blood (routine x 2)     Status: None   Collection Time: 10/19/17  3:00 PM  Result Value Ref Range Status   Specimen Description BLOOD LEFT HAND  Final   Special Requests   Final    BOTTLES DRAWN AEROBIC AND ANAEROBIC Blood Culture adequate volume   Culture NO GROWTH 5 DAYS  Final   Report Status 10/24/2017 FINAL  Final         Radiology Studies: No results found.      Scheduled Meds: . aspirin EC  81 mg Oral Daily  . calcium acetate  667 mg Oral TID WC  . carvedilol  6.25 mg Oral BID WC  . darbepoetin (ARANESP) injection - DIALYSIS  60 mcg Intravenous Q Mon-HD  . gabapentin  100 mg Oral Daily  . heparin injection (subcutaneous)  5,000 Units Subcutaneous Q12H  . hydrALAZINE  50 mg Oral BID  . isosorbide mononitrate  120 mg Oral Daily  . nystatin   Topical BID  . polyethylene glycol  17 g Oral Daily   Continuous Infusions: . cefTRIAXone (ROCEPHIN)  IV Stopped  (10/24/17 1436)  . DAPTOmycin (CUBICIN)  IV 680 mg (10/24/17 1723)  . furosemide       LOS: 14 days    Bonnell Public, MD [facility] Pager [phone]  If 7PM-7AM, please contact night-coverage [email or web address] Password [facility] 10/24/2017, 6:21 PM

## 2017-10-24 NOTE — Care Management Note (Signed)
Case Management Note  Patient Details  Name: Kristen Berry MRN: [id number] Date of Birth: [birth year]  Subjective/Objective: Pt presented as a transfer from [facility]. CKD Stage 4 Baseline Cr 2.4. Treating Left toe osteomyelitis- continues on IV Rocephin and Daptomycin. Worsening renal failure- has temporary cath & has had 2 Sessions of HD. HD is being held 2/2 to restart of IV Lasix.                   Action/Plan: Pt/OT recommendations for Home with [facility] Services. CM will continue to monitor for additional needs.   Expected Discharge Date:              Expected Discharge Plan:  [place]  In-House Referral:  NA  Discharge planning Services  CM Consult  Post Acute Care Choice:  Home Health Choice offered to:  Patient  DME Arranged:    DME Agency:     HH Arranged:    [place] Agency:     Status of Service:  In process, will continue to follow  If discussed at Long Length of Stay Meetings, dates discussed:    Additional Comments:  Bethena Roys, RN 10/24/2017, 3:18 PM

## 2017-10-24 NOTE — Care Management Important Message (Signed)
Important Message  Patient Details  Name: Kristen Berry MRN: [id number] Date of Birth: 1955/09/30   Medicare Important Message Given:  Yes    [name] 10/24/2017, 12:33 PM

## 2017-10-25 ENCOUNTER — Inpatient Hospital Stay (HOSPITAL_COMMUNITY): Payer: Medicare HMO

## 2017-10-25 DIAGNOSIS — I420 Dilated cardiomyopathy: Secondary | ICD-10-CM

## 2017-10-25 DIAGNOSIS — J810 Acute pulmonary edema: Secondary | ICD-10-CM

## 2017-10-25 LAB — TROPONIN I
Troponin I: 0.15 ng/mL (ref <0.03)
Troponin I: 0.15 ng/mL (ref <0.03)
Troponin I: 0.15 ng/mL (ref <0.03)

## 2017-10-25 LAB — RENAL FUNCTION PANEL
Albumin: 2.5 g/dL — ABNORMAL LOW (ref 3.5–5.0)
Anion gap: 17 — ABNORMAL HIGH (ref 5–15)
BUN: 58 mg/dL — ABNORMAL HIGH (ref 6–20)
CO2: 23 mmol/L (ref 22–32)
Calcium: 8.4 mg/dL — ABNORMAL LOW (ref 8.9–10.3)
Chloride: 94 mmol/L — ABNORMAL LOW (ref 101–111)
Creatinine, Ser: 2.88 mg/dL — ABNORMAL HIGH (ref 0.44–1.00)
GFR calc Af Amer: 19 mL/min — ABNORMAL LOW (ref >60)
GFR calc non Af Amer: 16 mL/min — ABNORMAL LOW (ref >60)
Glucose, Bld: 158 mg/dL — ABNORMAL HIGH (ref 65–99)
Phosphorus: 2.8 mg/dL (ref 2.5–4.6)
Potassium: 3.8 mmol/L (ref 3.5–5.1)
Sodium: 134 mmol/L — ABNORMAL LOW (ref 135–145)

## 2017-10-25 MED ORDER — FUROSEMIDE 10 MG/ML IJ SOLN
20.0000 mg | Freq: Once | INTRAMUSCULAR | Status: AC
  Administered 2017-10-25: 20 mg via INTRAVENOUS
  Filled 2017-10-25: qty 2

## 2017-10-25 MED ORDER — FUROSEMIDE 10 MG/ML IJ SOLN
160.0000 mg | Freq: Once | INTRAVENOUS | Status: DC
  Filled 2017-10-25: qty 16

## 2017-10-25 MED ORDER — FUROSEMIDE 10 MG/ML IJ SOLN
160.0000 mg | Freq: Four times a day (QID) | INTRAVENOUS | Status: DC
  Filled 2017-10-25 (×2): qty 16

## 2017-10-25 MED ORDER — ALBUTEROL SULFATE (2.5 MG/3ML) 0.083% IN NEBU
2.5000 mg | INHALATION_SOLUTION | RESPIRATORY_TRACT | Status: AC
  Administered 2017-10-25: 2.5 mg via RESPIRATORY_TRACT
  Filled 2017-10-25: qty 3

## 2017-10-25 NOTE — Progress Notes (Signed)
PROGRESS NOTE    Kristen Berry  JAS:[id number] DOB: 1956-04-05 DOA: 10/10/2017 PCP: Martinique, Sarah T, MD   Brief Narrative: Patient is a 62 year old female with past medical history significant for peripheral vascular disease, chronic kidney disease stage IV with baseline creatinine of around 2.4, recently diagnosed with left toe osteomyelitis at [facility] and discharged with IV daptomycin and ceftriaxone. Patient was receiving IV antibiotics daily at [facility] before transfer here. As per prior note, it was discussed with the surgery and no surgical procedure was recommended due to peripheral vascular disease. The plan was to follow-up with vascular and orthopedics as outpatient.  Patient was transferred to [facility] due to worsening renal failure and for nephrology consultation.    Placement of temporary dialysis catheter on 10/19/17, had and hemodialysis on 10/20/2017 and 10/21/17..  10/25/17 -HD is planned today. No new complaints.  Assessment & Plan:  #Acute kidney injury on CKD stage IV: - For HD today.   - Volume issue despite been on IV Diuretics - No significant change in scr. - Strict ins and out. - UA has no protein and RBCs. -Complements normal.  - Renal ultrasound consistent with chronic kidney disease.   - CK level normal, urine eos negative, kappa lambda ratio normal.  #Anemia of chronic kidney disease: Continue IV iron and ESA during dialysis.  #Shortness of breath/acute pulmonary edema/acute systolic congestive heart failure: - Chest x-ray with cardiomegaly with vascular congestion and edema.   -Elevated BNP.  started hemodialysis treatment. -Echocardiogram with EF of 35-40%.  -Continue aspirin, hydralazine, Imdur, Lasix. -Plan for ischemia evaluation, follow-up cardiology consult. - SOB has resolved.  #History of mild intermittent asthma: Stable, no exacerbation.    #Left second toe osteomyelitis: - Complete course of antibiotics (IV  Rocephin and Daptomycin until 11/11/2017). - Follow up with orthopedics on discharge as there are plans for vascular procedures.  -Follow-up blood culture, negatives so far.  #Type 2 diabetes:  -Continue to monitor blood sugar level.  - HbA1c 5.5.  #Hypertension:  - Continue Coreg at 12.5mg  po bid - Increased Hydralazine from 50mg  po bid to 50mg  po Tid - Continue other medication. - Continue to optimize BP control. - Hopefully, BP will continue to improve with HD today.   #Physical deconditioning: PT OT evaluation.   DVT prophylaxis: SCD.  Code Status: Full code Family Communication:  Disposition Plan: Home eventually.   Consultants:   Nephrology  Cardiology  Procedures: None Antimicrobials: On daptomycin and IV ceftriaxone since before admission  Subjective: No new complaints. No shortness of breath. No chest pain. Patient is not back to  Her baseline  Objective: Vitals:   10/25/17 1630 10/25/17 1700 10/25/17 1730 10/25/17 1800  BP: (!) 167/76 (!) 170/79 (!) 177/81 (!) 172/82  Pulse: (!) 104 (!) 104 (!) 104 (!) 105  Resp: 20 20 (!) 22 20  Temp:      TempSrc:      SpO2:      Weight:      Height:        Intake/Output Summary (Last 24 hours) at 10/25/2017 1832 Last data filed at 10/25/2017 1351 Gross per 24 hour  Intake 476 ml  Output 1250 ml  Net -774 ml   Filed Weights   10/24/17 0431 10/25/17 0421 10/25/17 1421  Weight: 112.4 kg (247 lb 12.8 oz) 113 kg (249 lb 1.9 oz) 107.3 kg (236 lb 8.9 oz)    Examination:  General exam: Not in distress. Respiratory system: Clear to  auscultation Cardiovascular system: S1-S2 normal.   Gastrointestinal system: Soft, nontender.  Bowel sounds positive. Central nervous system: Alert awake and following commands. Extremities: Mild leg edema..   Data Reviewed: I have personally reviewed following labs and imaging studies  CBC: Recent Labs  Lab 10/19/17 0433 10/20/17 0352 10/21/17 0438 10/22/17 0415  10/24/17 0419  WBC 17.2* 19.4* 23.5* 18.8* 16.2*  HGB 9.0* 8.4* 8.4* 8.6* 9.3*  HCT 28.0* 26.7* 26.5* 27.1* 29.3*  MCV 87.2 86.7 86.0 87.4 91.8  PLT 560* 446* 413* 427* 154*   Basic Metabolic Panel: Recent Labs  Lab 10/20/17 0352 10/21/17 0438 10/22/17 0415 10/23/17 0353 10/24/17 0419 10/25/17 1029  NA 133* 134* 135 137 134* 134*  K 4.4 3.9 3.7 4.1 4.1 3.8  CL 101 100* 99* 99* 95* 94*  CO2 15* 18* 23 25 24 23   GLUCOSE 140* 175* 134* 129* 127* 158*  BUN 151* 92* 53* 55* 58* 58*  CREATININE 4.36* 3.47* 2.76* 2.75* 2.89* 2.88*  CALCIUM 7.6* 7.6* 7.9* 8.2* 8.3* 8.4*  PHOS 6.1* 4.0  --  4.3 4.1 2.8   GFR: Estimated Creatinine Clearance: 26 mL/min (A) (by C-G formula based on SCr of 2.88 mg/dL (H)). Liver Function Tests: Recent Labs  Lab 10/20/17 0352 10/21/17 0438 10/23/17 0353 10/24/17 0419 10/25/17 1029  ALBUMIN 2.7* 2.5* 2.2* 2.4* 2.5*   No results for input(s): LIPASE, AMYLASE in the last 168 hours. No results for input(s): AMMONIA in the last 168 hours. Coagulation Profile: No results for input(s): INR, PROTIME in the last 168 hours. Cardiac Enzymes: Recent Labs  Lab 10/20/17 0352 10/25/17 1029 10/25/17 1601  CKTOTAL 195  --   --   TROPONINI  --  0.15* 0.15*   BNP (last 3 results) No results for input(s): PROBNP in the last 8760 hours. HbA1C: No results for input(s): HGBA1C in the last 72 hours. CBG: No results for input(s): GLUCAP in the last 168 hours. Lipid Profile: No results for input(s): CHOL, HDL, LDLCALC, TRIG, CHOLHDL, LDLDIRECT in the last 72 hours. Thyroid Function Tests: No results for input(s): TSH, T4TOTAL, FREET4, T3FREE, THYROIDAB in the last 72 hours. Anemia Panel: No results for input(s): VITAMINB12, FOLATE, FERRITIN, TIBC, IRON, RETICCTPCT in the last 72 hours. Sepsis Labs: No results for input(s): PROCALCITON, LATICACIDVEN in the last 168 hours.  Recent Results (from the past 240 hour(s))  Culture, blood (routine x 2)     Status:  None   Collection Time: 10/19/17  2:56 PM  Result Value Ref Range Status   Specimen Description BLOOD LEFT ANTECUBITAL  Final   Special Requests   Final    BOTTLES DRAWN AEROBIC AND ANAEROBIC Blood Culture results may not be optimal due to an inadequate volume of blood received in culture bottles   Culture NO GROWTH 5 DAYS  Final   Report Status 10/24/2017 FINAL  Final  Culture, blood (routine x 2)     Status: None   Collection Time: 10/19/17  3:00 PM  Result Value Ref Range Status   Specimen Description BLOOD LEFT HAND  Final   Special Requests   Final    BOTTLES DRAWN AEROBIC AND ANAEROBIC Blood Culture adequate volume   Culture NO GROWTH 5 DAYS  Final   Report Status 10/24/2017 FINAL  Final         Radiology Studies: Dg Chest Port 1 View  Result Date: 10/25/2017 CLINICAL DATA:  Shortness of breath tonight. EXAM: PORTABLE CHEST 1 VIEW COMPARISON:  10/24/2017 FINDINGS: Right central venous catheter  with tip over the right atrium. Right PICC line with tip over the low SVC region. No pneumothorax. Shallow inspiration. Cardiac enlargement with pulmonary vascular congestion. Diffuse interstitial and airspace infiltrates in the lungs likely representing edema. More dense consolidation over the right upper lung could represent superimposed pneumonia. Mild progression since previous study. No blunting of costophrenic angles. No pneumothorax. Calcification of the aorta. IMPRESSION: Appliances appear in satisfactory position. Cardiac enlargement with vascular congestion. Diffuse airspace and interstitial infiltration likely represents edema. Possible superimposed consolidation over the right upper lung. Electronically Signed   By: [name]   On: 10/25/2017 02:22   Dg Chest Port 1 View  Result Date: 10/24/2017 CLINICAL DATA:  62 y/o  F; congestive heart failure. EXAM: PORTABLE CHEST 1 VIEW COMPARISON:  10/15/2017 chest radiograph FINDINGS: Stable mild cardiomegaly given projection  and technique. Aortic atherosclerosis with calcification. Reticular and mild patchy opacities of lungs probably representing interstitial and alveolar pulmonary edema. Probable small left effusion. No pneumothorax. Right PICC line tip projects over upper SVC in central venous catheter tip projects over lower SVC. Bones are unremarkable. IMPRESSION: Cardiomegaly. Interstitial and mild alveolar pulmonary edema. Small left effusion. Electronically Signed   By: [name]   On: 10/24/2017 20:30        Scheduled Meds: . aspirin EC  81 mg Oral Daily  . calcium acetate  667 mg Oral TID WC  . carvedilol  12.5 mg Oral BID WC  . darbepoetin (ARANESP) injection - DIALYSIS  60 mcg Intravenous Q Mon-HD  . gabapentin  100 mg Oral Daily  . heparin injection (subcutaneous)  5,000 Units Subcutaneous Q12H  . hydrALAZINE  50 mg Oral Q8H  . isosorbide mononitrate  120 mg Oral Daily  . nystatin   Topical BID  . polyethylene glycol  17 g Oral Daily   Continuous Infusions: . cefTRIAXone (ROCEPHIN)  IV Stopped (10/24/17 1436)  . DAPTOmycin (CUBICIN)  IV Stopped (10/24/17 1753)  . furosemide       LOS: 15 days    Bonnell Public, MD [facility] Pager [phone]  If 7PM-7AM, please contact night-coverage [email or web address] Password [facility] 10/25/2017, 6:32 PM

## 2017-10-25 NOTE — Progress Notes (Signed)
Progress Note  Patient Name: Kristen Berry Date of Encounter: 10/25/2017  Primary Cardiologist: Dr. Tamala Julian  Subjective   C/o dyspnea. No chest pain this am. Events of last night noted. Pt c/o chest pressure and dyspnea. EKG without ischemic changes. Chest x-ray with evidence of diffuse pulm infiltrates c/w edema  Inpatient Medications    Scheduled Meds: . aspirin EC  81 mg Oral Daily  . calcium acetate  667 mg Oral TID WC  . carvedilol  12.5 mg Oral BID WC  . darbepoetin (ARANESP) injection - DIALYSIS  60 mcg Intravenous Q Mon-HD  . gabapentin  100 mg Oral Daily  . heparin injection (subcutaneous)  5,000 Units Subcutaneous Q12H  . hydrALAZINE  50 mg Oral Q8H  . isosorbide mononitrate  120 mg Oral Daily  . nystatin   Topical BID  . polyethylene glycol  17 g Oral Daily   Continuous Infusions: . cefTRIAXone (ROCEPHIN)  IV Stopped (10/24/17 1436)  . DAPTOmycin (CUBICIN)  IV Stopped (10/24/17 1753)  . furosemide Stopped (10/25/17 0748)   PRN Meds: acetaminophen **OR** acetaminophen, camphor-menthol **AND** hydrOXYzine, docusate sodium, feeding supplement (NEPRO CARB STEADY), hydrALAZINE, ipratropium-albuterol, lidocaine, nitroGLYCERIN, ondansetron **OR** ondansetron (ZOFRAN) IV, oxyCODONE, sodium chloride flush, sorbitol, zolpidem   Vital Signs    Vitals:   10/24/17 2110 10/25/17 0143 10/25/17 0421 10/25/17 0850  BP: (!) 185/76 (!) 155/69 (!) 148/58 (!) 158/66  Pulse: (!) 105 (!) 117 88 (!) 117  Resp: 20 (!) 24 (!) 22   Temp: 98.4 F (36.9 C)  98.6 F (37 C)   TempSrc: Oral  Oral   SpO2: 96% 93% 95% 95%  Weight:   249 lb 1.9 oz (113 kg)   Height:        Intake/Output Summary (Last 24 hours) at 10/25/2017 1134 Last data filed at 10/25/2017 0725 Gross per 24 hour  Intake 448 ml  Output 1250 ml  Net -802 ml   Filed Weights   10/23/17 0446 10/24/17 0431 10/25/17 0421  Weight: 245 lb 13 oz (111.5 kg) 247 lb 12.8 oz (112.4 kg) 249 lb 1.9 oz (113 kg)    Telemetry      Sinus - Personally Reviewed  ECG    No AM EKG  Physical Exam   General: Well developed, well nourished, appears dyspneic HEENT: OP clear, mucus membranes moist  SKIN: warm, dry. No rashes. Neuro: No focal deficits  Musculoskeletal: Muscle strength 5/5 all ext  Psychiatric: Mood and affect normal  Neck: No JVD, no carotid bruits, no thyromegaly, no lymphadenopathy.  Lungs:Basilar crackles.  Cardiovascular: Regular rate and rhythm. No murmurs, gallops or rubs. Abdomen:Soft. Bowel sounds present. Non-tender.  Extremities: No lower extremity edema. Pulses are 2 + in the bilateral DP/PT.   Labs    Chemistry Recent Labs  Lab 10/23/17 0353 10/24/17 0419 10/25/17 1029  NA 137 134* 134*  K 4.1 4.1 3.8  CL 99* 95* 94*  CO2 25 24 23   GLUCOSE 129* 127* 158*  BUN 55* 58* 58*  CREATININE 2.75* 2.89* 2.88*  CALCIUM 8.2* 8.3* 8.4*  ALBUMIN 2.2* 2.4* 2.5*  GFRNONAA 17* 16* 16*  GFRAA 20* 19* 19*  ANIONGAP 13 15 17*     Hematology Recent Labs  Lab 10/21/17 0438 10/22/17 0415 10/24/17 0419  WBC 23.5* 18.8* 16.2*  RBC 3.08* 3.10* 3.19*  HGB 8.4* 8.6* 9.3*  HCT 26.5* 27.1* 29.3*  MCV 86.0 87.4 91.8  MCH 27.3 27.7 29.2  MCHC 31.7 31.7 31.7  RDW 18.3* 18.9*  19.2*  PLT 413* 427* 452*    Cardiac EnzymesNo results for input(s): TROPONINI in the last 168 hours. No results for input(s): TROPIPOC in the last 168 hours.   BNP No results for input(s): BNP, PROBNP in the last 168 hours.   DDimer No results for input(s): DDIMER in the last 168 hours.   Radiology    Dg Chest Port 1 View  Result Date: 10/25/2017 CLINICAL DATA:  Shortness of breath tonight. EXAM: PORTABLE CHEST 1 VIEW COMPARISON:  10/24/2017 FINDINGS: Right central venous catheter with tip over the right atrium. Right PICC line with tip over the low SVC region. No pneumothorax. Shallow inspiration. Cardiac enlargement with pulmonary vascular congestion. Diffuse interstitial and airspace infiltrates in the lungs  likely representing edema. More dense consolidation over the right upper lung could represent superimposed pneumonia. Mild progression since previous study. No blunting of costophrenic angles. No pneumothorax. Calcification of the aorta. IMPRESSION: Appliances appear in satisfactory position. Cardiac enlargement with vascular congestion. Diffuse airspace and interstitial infiltration likely represents edema. Possible superimposed consolidation over the right upper lung. Electronically Signed   By: [name]   On: 10/25/2017 02:22   Dg Chest Port 1 View  Result Date: 10/24/2017 CLINICAL DATA:  62 y/o  F; congestive heart failure. EXAM: PORTABLE CHEST 1 VIEW COMPARISON:  10/15/2017 chest radiograph FINDINGS: Stable mild cardiomegaly given projection and technique. Aortic atherosclerosis with calcification. Reticular and mild patchy opacities of lungs probably representing interstitial and alveolar pulmonary edema. Probable small left effusion. No pneumothorax. Right PICC line tip projects over upper SVC in central venous catheter tip projects over lower SVC. Bones are unremarkable. IMPRESSION: Cardiomegaly. Interstitial and mild alveolar pulmonary edema. Small left effusion. Electronically Signed   By: Kristen Berry M.D.   On: 10/24/2017 20:30    Cardiac Studies   TTE: 10/17/17  Study Conclusions  - Left ventricle: Septal and apical akinesis. The cavity size was moderately dilated. Wall thickness was increased in a pattern of mild LVH. Systolic function was moderately reduced. The estimated ejection fraction was in the range of 35% to 40%. Doppler parameters are consistent with both elevated ventricular end-diastolic filling pressure and elevated left atrial filling pressure. - Mitral valve: Moderately calcified, moderately thickened annulus. Moderately thickened leaflets . The findings are consistent with mild stenosis. There was moderate regurgitation.  Valve area by continuity equation (using LVOT flow): 1.5 cm^2. - Left atrium: The atrium was mildly dilated. - Atrial septum: No defect or patent foramen ovale was identified. - Pulmonary arteries: PA peak pressure: 55 mm Hg (S).  Patient Profile     62 y.o. female with PMH of systolic HF, HTN, HL, CKD IV, osteomyelitis and PVD who presented with worsening renal function. Developed acute respiratory distress and acute renal failure. Echo 10/17/17 with LVEF=35-40%.   Assessment & Plan    1. Acute on chronic systolic HF/Cardiomyopathy: New diagnosis of cardiomyopathy, with high likelihood of underlying CAD. We will plan a cardiac cath when her renal issues are resolved. Nephrology is following. Given her respiratory distress over the last 12 hours and evidence of pulmonary edema, likely to convert back to HD today. She had a poor response to Lasix yesterday. We will continue to follow along and discuss ischemic testing when it is clear that her renal issues are stable.     For questions or updates, please contact [place] Please consult [email or web address] for contact info under Cardiology/STEMI.      Signed, Lauree Chandler, MD  10/25/2017, 11:34  AM

## 2017-10-25 NOTE — Progress Notes (Signed)
At approximately 0130, patient called asking for breathing treatment. Patient did not appear to be in distress but did present shallow and rapid breathing. Patient stated she was having some difficulty breathing and shortness of breath with chest tightness but no chest pain. Administered PRN duoneb treatment to patient. VS obtained at the time; BP 155/69, HR 117, R 24, and O2 93% on 3L nasal cannula. After breathing treatment finished, patient stated she felt a little better but was still having shortness of breath and chest tightness. Notified Baltazar Najjar via text page. Baltazar Najjar ordered an albuterol treatment, STAT chest x-ray, and STAT EKG. Albuterol treatment administered, chest x-ray completed, and EKG completed. Notified Baltazar Najjar that EKG, chest x-ray, and albuterol treatment had been completed and that patient still experiencing shortness of breath. Kirby then ordered 20mg  lasix and troponin x3.

## 2017-10-25 NOTE — Progress Notes (Signed)
CRITICAL VALUE ALERT  Critical Value:  Troponin 0.15  Date & Time Notied:  4290  Provider Notified: Dr. Marthenia Rolling  Orders Received/Actions taken: new orders placed, repeat lab

## 2017-10-25 NOTE — Progress Notes (Signed)
Assessment/ Plan:    1. Acute kidney injury on chronic kidney disease stage(cr 2.4) YQ:[id number] hemodynamically mediatedAKIthis hosp. 2. Acute diastolic CHF:  weight up, poor response to IV furosemide so will dialyze  3. Osteomyelitis second left toe/cellulitis: ongoing intravenous antibiotic   Subjective: Interval History: More SOB  Objective: Vital signs in last 24 hours: Temp:  [97.5 F (36.4 C)-98.6 F (37 C)] 98.6 F (37 C) (01/29 0421) Pulse Rate:  [86-117] 117 (01/29 0850) Resp:  [18-24] 22 (01/29 0421) BP: (148-185)/(58-77) 158/66 (01/29 0850) SpO2:  [93 %-96 %] 95 % (01/29 0850) Weight:  [113 kg (249 lb 1.9 oz)] 113 kg (249 lb 1.9 oz) (01/29 0421) Weight change: 0.6 kg (1 lb 5.2 oz)  Intake/Output from previous day: 01/28 0701 - 01/29 0700 In: 672 [P.O.:672] Out: 600 [Urine:600] Intake/Output this shift: Total I/O In: -  Out: 650 [Urine:650]  General appearance: alert and moderate distress, HD cath right neck Resp: diminished breath sounds bilaterally Cardio: regular rate and rhythm, S1, S2 normal, no murmur, click, rub or gallop Extremities: edema 1-2+  Lab Results: Recent Labs    10/24/17 0419  WBC 16.2*  HGB 9.3*  HCT 29.3*  PLT 452*   BMET:  Recent Labs    10/24/17 0419 10/25/17 1029  NA 134* 134*  K 4.1 3.8  CL 95* 94*  CO2 24 23  GLUCOSE 127* 158*  BUN 58* 58*  CREATININE 2.89* 2.88*  CALCIUM 8.3* 8.4*   No results for input(s): PTH in the last 72 hours. Iron Studies: No results for input(s): IRON, TIBC, TRANSFERRIN, FERRITIN in the last 72 hours. Studies/Results: Dg Chest Port 1 View  Result Date: 10/25/2017 CLINICAL DATA:  Shortness of breath tonight. EXAM: PORTABLE CHEST 1 VIEW COMPARISON:  10/24/2017 FINDINGS: Right central venous catheter with tip over the right atrium. Right PICC line with tip over the low SVC region. No pneumothorax. Shallow inspiration. Cardiac enlargement with pulmonary vascular congestion. Diffuse  interstitial and airspace infiltrates in the lungs likely representing edema. More dense consolidation over the right upper lung could represent superimposed pneumonia. Mild progression since previous study. No blunting of costophrenic angles. No pneumothorax. Calcification of the aorta. IMPRESSION: Appliances appear in satisfactory position. Cardiac enlargement with vascular congestion. Diffuse airspace and interstitial infiltration likely represents edema. Possible superimposed consolidation over the right upper lung. Electronically Signed   By: [name]   On: 10/25/2017 02:22   Dg Chest Port 1 View  Result Date: 10/24/2017 CLINICAL DATA:  62 y/o  F; congestive heart failure. EXAM: PORTABLE CHEST 1 VIEW COMPARISON:  10/15/2017 chest radiograph FINDINGS: Stable mild cardiomegaly given projection and technique. Aortic atherosclerosis with calcification. Reticular and mild patchy opacities of lungs probably representing interstitial and alveolar pulmonary edema. Probable small left effusion. No pneumothorax. Right PICC line tip projects over upper SVC in central venous catheter tip projects over lower SVC. Bones are unremarkable. IMPRESSION: Cardiomegaly. Interstitial and mild alveolar pulmonary edema. Small left effusion. Electronically Signed   By: [name]   On: 10/24/2017 20:30    Scheduled: . aspirin EC  81 mg Oral Daily  . calcium acetate  667 mg Oral TID WC  . carvedilol  12.5 mg Oral BID WC  . darbepoetin (ARANESP) injection - DIALYSIS  60 mcg Intravenous Q Mon-HD  . gabapentin  100 mg Oral Daily  . heparin injection (subcutaneous)  5,000 Units Subcutaneous Q12H  . hydrALAZINE  50 mg Oral Q8H  . isosorbide mononitrate  120 mg  Oral Daily  . nystatin   Topical BID  . polyethylene glycol  17 g Oral Daily     LOS: 15 days   Estanislado Emms 10/25/2017,11:39 AM

## 2017-10-26 DIAGNOSIS — D72829 Elevated white blood cell count, unspecified: Secondary | ICD-10-CM

## 2017-10-26 LAB — CBC WITH DIFFERENTIAL/PLATELET
Basophils Absolute: 0.1 10*3/uL (ref 0.0–0.1)
Basophils Relative: 0 %
Eosinophils Absolute: 0.3 10*3/uL (ref 0.0–0.7)
Eosinophils Relative: 2 %
HCT: 29.7 % — ABNORMAL LOW (ref 36.0–46.0)
Hemoglobin: 9.1 g/dL — ABNORMAL LOW (ref 12.0–15.0)
Lymphocytes Relative: 4 %
Lymphs Abs: 0.8 10*3/uL (ref 0.7–4.0)
MCH: 27.9 pg (ref 26.0–34.0)
MCHC: 30.6 g/dL (ref 30.0–36.0)
MCV: 91.1 fL (ref 78.0–100.0)
Monocytes Absolute: 1.9 10*3/uL — ABNORMAL HIGH (ref 0.1–1.0)
Monocytes Relative: 9 %
Neutro Abs: 18.4 10*3/uL — ABNORMAL HIGH (ref 1.7–7.7)
Neutrophils Relative %: 85 %
Platelets: 462 10*3/uL — ABNORMAL HIGH (ref 150–400)
RBC: 3.26 MIL/uL — ABNORMAL LOW (ref 3.87–5.11)
RDW: 19.3 % — ABNORMAL HIGH (ref 11.5–15.5)
WBC: 21.5 10*3/uL — ABNORMAL HIGH (ref 4.0–10.5)

## 2017-10-26 LAB — CBC
HCT: 30.1 % — ABNORMAL LOW (ref 36.0–46.0)
Hemoglobin: 9.3 g/dL — ABNORMAL LOW (ref 12.0–15.0)
MCH: 28.1 pg (ref 26.0–34.0)
MCHC: 30.9 g/dL (ref 30.0–36.0)
MCV: 90.9 fL (ref 78.0–100.0)
Platelets: 416 10*3/uL — ABNORMAL HIGH (ref 150–400)
RBC: 3.31 MIL/uL — ABNORMAL LOW (ref 3.87–5.11)
RDW: 19.2 % — ABNORMAL HIGH (ref 11.5–15.5)
WBC: 19.6 10*3/uL — ABNORMAL HIGH (ref 4.0–10.5)

## 2017-10-26 LAB — RENAL FUNCTION PANEL
Albumin: 2.3 g/dL — ABNORMAL LOW (ref 3.5–5.0)
Albumin: 2.3 g/dL — ABNORMAL LOW (ref 3.5–5.0)
Anion gap: 13 (ref 5–15)
Anion gap: 12 (ref 5–15)
BUN: 25 mg/dL — ABNORMAL HIGH (ref 6–20)
BUN: 13 mg/dL (ref 6–20)
CO2: 25 mmol/L (ref 22–32)
CO2: 26 mmol/L (ref 22–32)
Calcium: 8.2 mg/dL — ABNORMAL LOW (ref 8.9–10.3)
Calcium: 8.1 mg/dL — ABNORMAL LOW (ref 8.9–10.3)
Chloride: 96 mmol/L — ABNORMAL LOW (ref 101–111)
Chloride: 98 mmol/L — ABNORMAL LOW (ref 101–111)
Creatinine, Ser: 2.1 mg/dL — ABNORMAL HIGH (ref 0.44–1.00)
Creatinine, Ser: 1.48 mg/dL — ABNORMAL HIGH (ref 0.44–1.00)
GFR calc Af Amer: 28 mL/min — ABNORMAL LOW (ref >60)
GFR calc Af Amer: 43 mL/min — ABNORMAL LOW (ref >60)
GFR calc non Af Amer: 24 mL/min — ABNORMAL LOW (ref >60)
GFR calc non Af Amer: 37 mL/min — ABNORMAL LOW (ref >60)
Glucose, Bld: 143 mg/dL — ABNORMAL HIGH (ref 65–99)
Glucose, Bld: 219 mg/dL — ABNORMAL HIGH (ref 65–99)
Phosphorus: 2.8 mg/dL (ref 2.5–4.6)
Phosphorus: 1.7 mg/dL — ABNORMAL LOW (ref 2.5–4.6)
Potassium: 3.5 mmol/L (ref 3.5–5.1)
Potassium: 3.4 mmol/L — ABNORMAL LOW (ref 3.5–5.1)
Sodium: 134 mmol/L — ABNORMAL LOW (ref 135–145)
Sodium: 136 mmol/L (ref 135–145)

## 2017-10-26 LAB — PROCALCITONIN: Procalcitonin: 1.25 ng/mL

## 2017-10-26 MED ORDER — LIDOCAINE HCL (PF) 1 % IJ SOLN: 5.0000 mL | INTRAMUSCULAR | Status: DC | PRN

## 2017-10-26 MED ORDER — ALTEPLASE 2 MG IJ SOLR: 2.0000 mg | Freq: Once | INTRAMUSCULAR | Status: DC | PRN

## 2017-10-26 MED ORDER — DARBEPOETIN ALFA 60 MCG/0.3ML IJ SOSY
60.0000 ug | PREFILLED_SYRINGE | INTRAMUSCULAR | Status: DC
  Administered 2017-10-27: 60 ug via INTRAVENOUS
  Filled 2017-10-26: qty 0.3

## 2017-10-26 MED ORDER — PENTAFLUOROPROP-TETRAFLUOROETH EX AERO: 1.0000 "application " | INHALATION_SPRAY | CUTANEOUS | Status: DC | PRN

## 2017-10-26 MED ORDER — HEPARIN SODIUM (PORCINE) 1000 UNIT/ML DIALYSIS: 1000.0000 [IU] | INTRAMUSCULAR | Status: DC | PRN

## 2017-10-26 MED ORDER — SODIUM CHLORIDE 0.9 % IV SOLN: 680.0000 mg | INTRAVENOUS | Status: DC

## 2017-10-26 MED ORDER — SODIUM CHLORIDE 0.9 % IV SOLN: 100.0000 mL | INTRAVENOUS | Status: DC | PRN

## 2017-10-26 MED ORDER — HEPARIN SODIUM (PORCINE) 1000 UNIT/ML DIALYSIS: 20.0000 [IU]/kg | INTRAMUSCULAR | Status: DC | PRN

## 2017-10-26 MED ORDER — FUROSEMIDE 10 MG/ML IJ SOLN
160.0000 mg | Freq: Once | INTRAVENOUS | Status: AC
  Administered 2017-10-26: 160 mg via INTRAVENOUS
  Filled 2017-10-26: qty 16

## 2017-10-26 MED ORDER — LIDOCAINE-PRILOCAINE 2.5-2.5 % EX CREA: 1.0000 "application " | TOPICAL_CREAM | CUTANEOUS | Status: DC | PRN

## 2017-10-26 NOTE — Progress Notes (Signed)
PT Cancellation Note  Patient Details Name: Kristen Berry MRN: [id number] DOB: 01/28/1956   Cancelled Treatment:    Reason Eval/Treat Not Completed: Patient at procedure or test/unavailable. Pt currently off unit. Will check back as schedule allows to continue with PT POC.    [name] 10/26/2017, 2:44 PM   [name], PT, DPT Acute Rehabilitation Services Pager: [phone]

## 2017-10-26 NOTE — Progress Notes (Signed)
Occupational Therapy Treatment Patient Details Name: Kristen Berry MRN: [id number] DOB: 1956-04-30 Today's Date: 10/26/2017    History of present illness Patient is a 62 year old female who presented to the hospital with shortness of breath. She was found to have an acute kidney injury. PMH: PVD, HTN, CHF CKD, OA    OT comments  Pt progressing towards goals, completing room level functional mobility at RW level with MinGuard assist. Pt completed bathing and dressing ADLs with MinA for LB ADLs this session. Pt with increased fatigue with standing activity requiring seated rest breaks. Pt will benefit from continued acute OT services to maximize her safety and independence with ADLs and mobility. Continue per POC.    Follow Up Recommendations  Home health OT;Supervision/Assistance - 24 hour    Equipment Recommendations  None recommended by OT          Precautions / Restrictions Precautions Precautions: Fall Restrictions Weight Bearing Restrictions: No       Mobility Bed Mobility               General bed mobility comments: Pt was received sitting up in the chair  Transfers Overall transfer level: Needs assistance Equipment used: Rolling walker (2 wheeled) Transfers: Sit to/from Stand Sit to Stand: Min assist;Min guard         General transfer comment: Assist to rise to standing at RW with Pt using momentum during sit<>stand; Pt completed sit<>stand with MinA from recliner, MinGuard from [facility]     Balance Overall balance assessment: Needs assistance Sitting-balance support: Feet supported;No upper extremity supported Sitting balance-Leahy Scale: Good     Standing balance support: Bilateral upper extremity supported;During functional activity;No upper extremity supported Standing balance-Leahy Scale: Fair Standing balance comment: Pt able to maintain static standing during clothing management with close minguard for safety; reliant on UE support during mobility                             ADL either performed or assessed with clinical judgement   ADL Overall ADL's : Needs assistance/impaired     Grooming: Wash/dry face;Supervision/safety;Sitting   Upper Body Bathing: Set up;Sitting   Lower Body Bathing: Sit to/from stand;Minimal assistance   Upper Body Dressing : Set up;Sitting Upper Body Dressing Details (indicate cue type and reason): doffing/donning new gown  Lower Body Dressing: Min guard;Sit to/from stand Lower Body Dressing Details (indicate cue type and reason): Pt doffing/donning underwear using figure 4 to don, close MinGuard for safety during standing portion of task              Functional mobility during ADLs: Min guard;Rolling walker General ADL Comments: Pt completed room level functional mobiliy using RW, completing bathing ADLs seated on 3:1 at sink; MinA provided during standing portions of task with Pt standing approx 2 min prior to needing seated rest break                       Cognition Arousal/Alertness: Awake/alert Behavior During Therapy: WFL for tasks assessed/performed Overall Cognitive Status: Within Functional Limits for tasks assessed                                                            Pertinent Vitals/  Pain       Pain Assessment: Faces Faces Pain Scale: Hurts a little bit Pain Location: general discomfort Pain Descriptors / Indicators: Discomfort Pain Intervention(s): Monitored during session                                                          Frequency  Min 2X/week        Progress Toward Goals  OT Goals(current goals can now be found in the care plan section)  Progress towards OT goals: Progressing toward goals  Acute Rehab OT Goals Patient Stated Goal: Feel stronger  OT Goal Formulation: With patient Time For Goal Achievement: 10/31/17 Potential to Achieve Goals: Good  Plan Discharge plan remains  appropriate                     AM-PAC PT "6 Clicks" Daily Activity     Outcome Measure   Help from another person eating meals?: None Help from another person taking care of personal grooming?: A Little Help from another person toileting, which includes using toliet, bedpan, or urinal?: A Little Help from another person bathing (including washing, rinsing, drying)?: A Little Help from another person to put on and taking off regular upper body clothing?: A Little Help from another person to put on and taking off regular lower body clothing?: A Little 6 Click Score: 19    End of Session Equipment Utilized During Treatment: Oxygen;Gait belt;Rolling walker  OT Visit Diagnosis: Other abnormalities of gait and mobility (R26.89)   Activity Tolerance Patient tolerated treatment well   Patient Left with call bell/phone within reach;Other (comment)(seated EOB )   Nurse Communication Mobility status        Time: 5573-2202 OT Time Calculation (min): 32 min  Charges: OT General Charges $OT Visit: 1 Visit OT Treatments $Self Care/Home Management : 23-37 mins  [name], [place] Pager [phone] 10/26/2017    [name] 10/26/2017, 2:54 PM

## 2017-10-26 NOTE — Progress Notes (Signed)
PROGRESS NOTE    Kristen Berry  BCW:[id number] DOB: [birth year] DOA: 10/10/2017 PCP: Martinique, Sarah T, MD   Brief Narrative: Patient is a 62 year old female with past medical history significant for peripheral vascular disease, chronic kidney disease stage IV with baseline creatinine of around 2.4, recently diagnosed left toe osteomyelitis at [facility], discharged on IV daptomycin and ceftriaxone. As per prior note, the osteomyelitis was discussed with the surgery and no surgical procedure was recommended due to peripheral vascular disease. The plan was to follow-up with vascular and orthopedics as outpatient.  Patient was transferred to [facility] due to worsening renal failure and for nephrology consultation.  Temporary dialysis catheter on 10/19/17, and the patient has had HD on 10/20/2017 and 10/21/17, and 10/25/17. Further management and disposition will be as per Nephrology.  Assessment & Plan:  #Acute kidney injury on CKD stage IV: - Patient has had 3 Hemodialysis sessions.  - Temporary HD still in plans. - Will await input from Nephrology regarding plans for further HD, and if any need for placement of more permanent HD access. - Nephrology input is highly appreciated.  - Strict ins and out. - UA has no protein and RBCs. -Complements normal.  - Renal ultrasound consistent with chronic kidney disease.   - CK level normal, urine eos negative, kappa lambda ratio normal.  #Anemia of chronic kidney disease: Continue IV iron and ESA during dialysis.  #Shortness of breath/acute pulmonary edema/acute systolic congestive heart failure: - Chest x-ray with cardiomegaly with vascular congestion and edema.   -Elevated BNP.  started hemodialysis treatment. -Echocardiogram with EF of 35-40%.  -Continue aspirin, hydralazine, Imdur, Lasix. -Plan for ischemia evaluation, follow-up cardiology consult. - SOB has resolved.  #History of mild intermittent asthma: Stable, no  exacerbation.    #Left second toe osteomyelitis: - Complete course of antibiotics (IV Rocephin and Daptomycin until 11/11/2017). - Follow up with orthopedics on discharge as there are plans for vascular procedures.  -Follow-up blood culture, negatives so far.  #Type 2 diabetes:  -Continue to monitor blood sugar level.  - HbA1c 5.5.  #Hypertension:  - Continue Coreg at 12.5mg  po bid - Increased Hydralazine from 50mg  po bid to 50mg  po Tid - Continue ISMN 120 mg daily. - Continue to optimize BP control. - Hopefully, BP will continue to improve with optimization of volume.  #Persistent Leukocytosis: Cause unclear Low threshold to proceed with MRI of the foot to reassess the osteomyelitis and rule out abscess. No chest symptoms or urinary symptoms reported. No documented fever.  #Physical deconditioning: PT OT evaluation.   DVT prophylaxis: SCD.  Code Status: Full code Family Communication:  Disposition Plan: Home eventually.   Consultants:   Nephrology  Cardiology  Procedures: None Antimicrobials: On daptomycin and IV ceftriaxone since before admission  Subjective: No new complaints. No shortness of breath. No chest pain. Patient is not back to  Her baseline  Objective: Vitals:   10/26/17 1600 10/26/17 1630 10/26/17 1700 10/26/17 1730  BP: (!) 145/65 (!) 134/58 (!) 142/60 (!) 141/56  Pulse: 77 74 75 76  Resp: 19 18 17 18   Temp:      TempSrc:      SpO2: 99%     Weight:      Height:        Intake/Output Summary (Last 24 hours) at 10/26/2017 1831 Last data filed at 10/26/2017 1154 Gross per 24 hour  Intake 727.6 ml  Output 3850 ml  Net -3122.4 ml   [employer]  10/25/17 1841 10/26/17 0500 10/26/17 1511  Weight: 102.9 kg (226 lb 13.7 oz) 103.5 kg (228 lb 2.8 oz) 103.9 kg (229 lb 0.9 oz)    Examination:  General exam: Not in distress. Respiratory system: Clear to auscultation Cardiovascular system: S1-S2 normal.   Gastrointestinal system: Soft,  nontender.  Bowel sounds positive. Central nervous system: Alert awake and following commands. Extremities: No leg edema..   Data Reviewed: I have personally reviewed following labs and imaging studies  CBC: Recent Labs  Lab 10/20/17 0352 10/21/17 0438 10/22/17 0415 10/24/17 0419 10/26/17 1200  WBC 19.4* 23.5* 18.8* 16.2* 21.5*  NEUTROABS  --   --   --   --  18.4*  HGB 8.4* 8.4* 8.6* 9.3* 9.1*  HCT 26.7* 26.5* 27.1* 29.3* 29.7*  MCV 86.7 86.0 87.4 91.8 91.1  PLT 446* 413* 427* 452* 664*   Basic Metabolic Panel: Recent Labs  Lab 10/21/17 0438 10/22/17 0415 10/23/17 0353 10/24/17 0419 10/25/17 1029 10/26/17 1200  NA 134* 135 137 134* 134* 134*  K 3.9 3.7 4.1 4.1 3.8 3.5  CL 100* 99* 99* 95* 94* 96*  CO2 18* 23 25 24 23 25   GLUCOSE 175* 134* 129* 127* 158* 143*  BUN 92* 53* 55* 58* 58* 25*  CREATININE 3.47* 2.76* 2.75* 2.89* 2.88* 2.10*  CALCIUM 7.6* 7.9* 8.2* 8.3* 8.4* 8.2*  PHOS 4.0  --  4.3 4.1 2.8 2.8   GFR: Estimated Creatinine Clearance: 35 mL/min (A) (by C-G formula based on SCr of 2.1 mg/dL (H)). Liver Function Tests: Recent Labs  Lab 10/21/17 0438 10/23/17 0353 10/24/17 0419 10/25/17 1029 10/26/17 1200  ALBUMIN 2.5* 2.2* 2.4* 2.5* 2.3*   No results for input(s): LIPASE, AMYLASE in the last 168 hours. No results for input(s): AMMONIA in the last 168 hours. Coagulation Profile: No results for input(s): INR, PROTIME in the last 168 hours. Cardiac Enzymes: Recent Labs  Lab 10/20/17 0352 10/25/17 1029 10/25/17 1601 10/25/17 2157  CKTOTAL 195  --   --   --   TROPONINI  --  0.15* 0.15* 0.15*   BNP (last 3 results) No results for input(s): PROBNP in the last 8760 hours. HbA1C: No results for input(s): HGBA1C in the last 72 hours. CBG: No results for input(s): GLUCAP in the last 168 hours. Lipid Profile: No results for input(s): CHOL, HDL, LDLCALC, TRIG, CHOLHDL, LDLDIRECT in the last 72 hours. Thyroid Function Tests: No results for input(s):  TSH, T4TOTAL, FREET4, T3FREE, THYROIDAB in the last 72 hours. Anemia Panel: No results for input(s): VITAMINB12, FOLATE, FERRITIN, TIBC, IRON, RETICCTPCT in the last 72 hours. Sepsis Labs: No results for input(s): PROCALCITON, LATICACIDVEN in the last 168 hours.  Recent Results (from the past 240 hour(s))  Culture, blood (routine x 2)     Status: None   Collection Time: 10/19/17  2:56 PM  Result Value Ref Range Status   Specimen Description BLOOD LEFT ANTECUBITAL  Final   Special Requests   Final    BOTTLES DRAWN AEROBIC AND ANAEROBIC Blood Culture results may not be optimal due to an inadequate volume of blood received in culture bottles   Culture NO GROWTH 5 DAYS  Final   Report Status 10/24/2017 FINAL  Final  Culture, blood (routine x 2)     Status: None   Collection Time: 10/19/17  3:00 PM  Result Value Ref Range Status   Specimen Description BLOOD LEFT HAND  Final   Special Requests   Final    BOTTLES DRAWN AEROBIC AND  ANAEROBIC Blood Culture adequate volume   Culture NO GROWTH 5 DAYS  Final   Report Status 10/24/2017 FINAL  Final         Radiology Studies: Dg Chest Port 1 View  Result Date: 10/25/2017 CLINICAL DATA:  Shortness of breath tonight. EXAM: PORTABLE CHEST 1 VIEW COMPARISON:  10/24/2017 FINDINGS: Right central venous catheter with tip over the right atrium. Right PICC line with tip over the low SVC region. No pneumothorax. Shallow inspiration. Cardiac enlargement with pulmonary vascular congestion. Diffuse interstitial and airspace infiltrates in the lungs likely representing edema. More dense consolidation over the right upper lung could represent superimposed pneumonia. Mild progression since previous study. No blunting of costophrenic angles. No pneumothorax. Calcification of the aorta. IMPRESSION: Appliances appear in satisfactory position. Cardiac enlargement with vascular congestion. Diffuse airspace and interstitial infiltration likely represents edema. Possible  superimposed consolidation over the right upper lung. Electronically Signed   By: [name]   On: 10/25/2017 02:22        Scheduled Meds: . aspirin EC  81 mg Oral Daily  . calcium acetate  667 mg Oral TID WC  . carvedilol  12.5 mg Oral BID WC  . [START ON 10/27/2017] darbepoetin (ARANESP) injection - DIALYSIS  60 mcg Intravenous Q Thu-HD  . gabapentin  100 mg Oral Daily  . heparin injection (subcutaneous)  5,000 Units Subcutaneous Q12H  . hydrALAZINE  50 mg Oral Q8H  . isosorbide mononitrate  120 mg Oral Daily  . polyethylene glycol  17 g Oral Daily   Continuous Infusions: . sodium chloride    . sodium chloride    . cefTRIAXone (ROCEPHIN)  IV Stopped (10/24/17 1436)  . DAPTOmycin (CUBICIN)  IV Stopped (10/24/17 1753)     LOS: 16 days    Bonnell Public, MD [facility] Pager [phone]  If 7PM-7AM, please contact night-coverage [email or web address] Password [facility] 10/26/2017, 6:31 PM

## 2017-10-27 ENCOUNTER — Inpatient Hospital Stay (HOSPITAL_COMMUNITY): Payer: Medicare HMO

## 2017-10-27 DIAGNOSIS — I5041 Acute combined systolic (congestive) and diastolic (congestive) heart failure: Secondary | ICD-10-CM

## 2017-10-27 DIAGNOSIS — N184 Chronic kidney disease, stage 4 (severe): Secondary | ICD-10-CM

## 2017-10-27 DIAGNOSIS — I96 Gangrene, not elsewhere classified: Secondary | ICD-10-CM

## 2017-10-27 DIAGNOSIS — M86672 Other chronic osteomyelitis, left ankle and foot: Secondary | ICD-10-CM

## 2017-10-27 LAB — CBC
HCT: 28.6 % — ABNORMAL LOW (ref 36.0–46.0)
Hemoglobin: 8.6 g/dL — ABNORMAL LOW (ref 12.0–15.0)
MCH: 27.4 pg (ref 26.0–34.0)
MCHC: 30.1 g/dL (ref 30.0–36.0)
MCV: 91.1 fL (ref 78.0–100.0)
Platelets: 445 10*3/uL — ABNORMAL HIGH (ref 150–400)
RBC: 3.14 MIL/uL — ABNORMAL LOW (ref 3.87–5.11)
RDW: 19 % — ABNORMAL HIGH (ref 11.5–15.5)
WBC: 18.3 10*3/uL — ABNORMAL HIGH (ref 4.0–10.5)

## 2017-10-27 LAB — RENAL FUNCTION PANEL
Albumin: 2.3 g/dL — ABNORMAL LOW (ref 3.5–5.0)
Anion gap: 13 (ref 5–15)
BUN: 18 mg/dL (ref 6–20)
CO2: 26 mmol/L (ref 22–32)
Calcium: 8 mg/dL — ABNORMAL LOW (ref 8.9–10.3)
Chloride: 98 mmol/L — ABNORMAL LOW (ref 101–111)
Creatinine, Ser: 1.9 mg/dL — ABNORMAL HIGH (ref 0.44–1.00)
GFR calc Af Amer: 32 mL/min — ABNORMAL LOW (ref >60)
GFR calc non Af Amer: 27 mL/min — ABNORMAL LOW (ref >60)
Glucose, Bld: 117 mg/dL — ABNORMAL HIGH (ref 65–99)
Phosphorus: 2.2 mg/dL — ABNORMAL LOW (ref 2.5–4.6)
Potassium: 3.3 mmol/L — ABNORMAL LOW (ref 3.5–5.1)
Sodium: 137 mmol/L (ref 135–145)

## 2017-10-27 LAB — SEDIMENTATION RATE: Sed Rate: 127 mm/hr — ABNORMAL HIGH (ref 0–22)

## 2017-10-27 LAB — CK: Total CK: 12 U/L — ABNORMAL LOW (ref 38–234)

## 2017-10-27 LAB — C-REACTIVE PROTEIN: CRP: 18.8 mg/dL — ABNORMAL HIGH (ref <1.0)

## 2017-10-27 MED ORDER — SODIUM CHLORIDE 0.9 % IV SOLN: 100.0000 mL | INTRAVENOUS | Status: DC | PRN

## 2017-10-27 MED ORDER — SODIUM CHLORIDE 0.9 % IV SOLN
100.0000 mL | INTRAVENOUS | Status: DC | PRN
Start: 2017-10-27 — End: 2017-10-27

## 2017-10-27 MED ORDER — HEPARIN SODIUM (PORCINE) 1000 UNIT/ML DIALYSIS: 20.0000 [IU]/kg | INTRAMUSCULAR | Status: DC | PRN

## 2017-10-27 MED ORDER — DARBEPOETIN ALFA 60 MCG/0.3ML IJ SOSY
PREFILLED_SYRINGE | INTRAMUSCULAR | Status: AC
  Filled 2017-10-27: qty 0.3

## 2017-10-27 MED ORDER — ALTEPLASE 2 MG IJ SOLR: 2.0000 mg | Freq: Once | INTRAMUSCULAR | Status: DC | PRN

## 2017-10-27 MED ORDER — POTASSIUM CHLORIDE CRYS ER 20 MEQ PO TBCR
20.0000 meq | EXTENDED_RELEASE_TABLET | Freq: Once | ORAL | Status: AC
  Administered 2017-10-27: 20 meq via ORAL
  Filled 2017-10-27: qty 1

## 2017-10-27 MED ORDER — LIDOCAINE-PRILOCAINE 2.5-2.5 % EX CREA
1.0000 | TOPICAL_CREAM | CUTANEOUS | Status: DC | PRN
Start: 2017-10-27 — End: 2017-10-27

## 2017-10-27 MED ORDER — LIDOCAINE HCL (PF) 1 % IJ SOLN: 5.0000 mL | INTRAMUSCULAR | Status: DC | PRN

## 2017-10-27 MED ORDER — HEPARIN SODIUM (PORCINE) 1000 UNIT/ML DIALYSIS: 1000.0000 [IU] | INTRAMUSCULAR | Status: DC | PRN

## 2017-10-27 MED ORDER — LIDOCAINE-PRILOCAINE 2.5-2.5 % EX CREA: 1.0000 "application " | TOPICAL_CREAM | CUTANEOUS | Status: DC | PRN

## 2017-10-27 MED ORDER — PENTAFLUOROPROP-TETRAFLUOROETH EX AERO
1.0000 | INHALATION_SPRAY | CUTANEOUS | Status: DC | PRN
Start: 2017-10-27 — End: 2017-10-27

## 2017-10-27 MED ORDER — HEPARIN SODIUM (PORCINE) 1000 UNIT/ML DIALYSIS
1000.0000 [IU] | INTRAMUSCULAR | Status: DC | PRN
Start: 2017-10-27 — End: 2017-10-27

## 2017-10-27 MED ORDER — SODIUM CHLORIDE 0.9 % IV SOLN
680.0000 mg | INTRAVENOUS | Status: DC
  Administered 2017-10-28: 680 mg via INTRAVENOUS
  Filled 2017-10-27 (×2): qty 13.6

## 2017-10-27 MED ORDER — PENTAFLUOROPROP-TETRAFLUOROETH EX AERO: 1.0000 "application " | INHALATION_SPRAY | CUTANEOUS | Status: DC | PRN

## 2017-10-27 NOTE — Progress Notes (Signed)
PROGRESS NOTE    Kristen Berry  NLG:[id number] DOB: 1956-01-28 DOA: 10/10/2017 PCP: Martinique, Sarah T, MD   Brief Narrative: Patient is a 62 year old female with past medical history significant for peripheral vascular disease, combined congestive heart failure with an EF of 35-40% with septal and apical akinesis, pulmonary hypertension, chronic kidney disease stage IV with baseline creatinine of around 2.4, recently diagnosed left toe osteomyelitis at [facility], discharged on IV daptomycin and ceftriaxone. As per prior note, the osteomyelitis was discussed with the surgery and no surgical procedure was recommended due to peripheral vascular disease. The plan was to follow-up with vascular and orthopedics as outpatient.  Patient was transferred to [facility] due to worsening renal failure and for nephrology consultation.  Temporary dialysis catheter was placed on on 10/19/17, and the patient has had HD on 10/20/2017 and 10/21/17, and 10/25/17.  The plan is for another hemodialysis today.  I have consulted both orthopedic and vascular teams.  Patient has had persistent leukocytosis.  The left second toe looks very bad and will likely need to be amputated.  Due to the likely severe peripheral artery disease of the left lower extremity, input from the vascular surgery team will be appreciated.  The patient also has new onset cardiomyopathy with an ejection fraction of 35-40%, with septal and apical akinesis.  The patient may need cardiology clearance prior to any surgical procedure.  Nephrology input is appreciated.    Assessment & Plan:  #Acute kidney injury on CKD stage IV: - Patient has had 3 Hemodialysis sessions.  - For hemodialysis today. -Patient hemodialysis access remains temporary access.   -If cardiac catheterization, peripheral artery disease workup and orthopedic surgery of plan, the patient may need more permanent dialysis access. -Nephrology team is managing the patient's  renal replacement therapy. - Will await input from Nephrology regarding any need for placement of more permanent HD access. - Nephrology input is highly appreciated.  - Strict ins and out. - UA has no protein and RBCs. -Complements normal.  - Renal ultrasound consistent with chronic kidney disease.   - CK level normal, urine eos negative, kappa lambda ratio normal.  #Anemia of chronic kidney disease: Continue IV iron and ESA during dialysis.  #Shortness of breath/acute pulmonary edema/acute systolic congestive heart failure: - Chest x-ray with cardiomegaly with vascular congestion and edema.   -Elevated BNP.  started hemodialysis treatment. -Echocardiogram with EF of 35-40%, septal and apical akinesis.  Patient also has pulmonary hypertension and valvular heart disease.  -Continue aspirin, hydralazine, Imdur, Lasix. -Plan for ischemia evaluation, follow-up cardiology consult. - SOB has resolved.  #History of mild intermittent asthma: Stable, no exacerbation.    #Left second toe osteomyelitis: - Complete course of antibiotics (IV Rocephin and Daptomycin until 11/11/2017). -Orthopedic and vascular team consulted, considering that the patient has had persistent leukocytosis.  ESR is also very elevated.   -Blood culture came back negative.    #Type 2 diabetes:  -Continue to monitor blood sugar level.  - HbA1c 5.5.  #Hypertension:  - Continue Coreg at 12.86m po bid - Increased Hydralazine from 541mpo bid to 5016mo Tid - Continue ISMN 120 mg daily. - Continue to optimize BP control. - Hopefully, BP will continue to improve with optimization of volume.  #Persistent Leukocytosis: Course is likely left to infection and osteomyelitis. Orthopedic and vascular team consulted.   #Physical deconditioning: PT OT evaluation.   DVT prophylaxis: SCD.  Code Status: Full code Family Communication:  Disposition Plan: Home eventually.  Consultants:    Nephrology  Cardiology  Procedures: None Antimicrobials: On daptomycin and IV ceftriaxone since before admission  Subjective: No new complaints. No shortness of breath. No chest pain. Patient is not back to  Her baseline  Objective: Vitals:   10/27/17 0528 10/27/17 0700 10/27/17 0800 10/27/17 1414  BP: (!) 157/53 (!) 158/47 (!) 158/47 (!) 132/54  Pulse: 79 86 90 88  Resp: _0 Temp: 97.8 F (36.6 C) 98.3 F (36.8 C) 98.3 F (36.8 C) 98.6 F (37 C)  TempSrc:  Oral Oral Oral  SpO2: 98% (!) 89% 94% 96%  Weight:      Height:        Intake/Output Summary (Last 24 hours) at 10/27/2017 1504 Last data filed at 10/27/2017 1000 Gross per 24 hour  Intake 830 ml  Output 2500 ml  Net -1670 ml   Filed Weights   10/26/17 1511 10/26/17 1811 10/27/17 0500  Weight: 103.9 kg (229 lb 0.9 oz) 101.4 kg (223 lb 8.7 oz) 102.7 kg (226 lb 6.6 oz)    Examination:  General exam: Not in distress. Respiratory system: Clear to auscultation Cardiovascular system: S1-S2 normal.   Gastrointestinal system: Soft, nontender.  Bowel sounds positive. Central nervous system: Alert awake and following commands. Extremities: No leg edema..   Data Reviewed: I have personally reviewed following labs and imaging studies  CBC: Recent Labs  Lab 10/21/17 0438 10/22/17 0415 10/24/17 0419 10/26/17 1200 10/26/17 2002  WBC 23.5* 18.8* 16.2* 21.5* 19.6*  NEUTROABS  --   --   --  18.4*  --   HGB 8.4* 8.6* 9.3* 9.1* 9.3*  HCT 26.5* 27.1* 29.3* 29.7* 30.1*  MCV 86.0 87.4 91.8 91.1 90.9  PLT 413* 427* 452* 462* 720*   Basic Metabolic Panel: Recent Labs  Lab 10/24/17 0419 10/25/17 1029 10/26/17 1200 10/26/17 2002 10/27/17 0322  NA 134* 134* 134* 136 137  K 4.1 3.8 3.5 3.4* 3.3*  CL 95* 94* 96* 98* 98*  CO2 _1 GLUCOSE 127* 158* 143* 219* 117*  BUN 58* 58* 25* 13 18  CREATININE 2.89* 2.88* 2.10* 1.48* 1.90*  CALCIUM 8.3* 8.4* 8.2* 8.1* 8.0*  PHOS 4.1 2.8 2.8 1.7* 2.2*    GFR: Estimated Creatinine Clearance: 38.5 mL/min (A) (by C-G formula based on SCr of 1.9 mg/dL (H)). Liver Function Tests: Recent Labs  Lab 10/24/17 0419 10/25/17 1029 10/26/17 1200 10/26/17 2002 10/27/17 0322  ALBUMIN 2.4* 2.5* 2.3* 2.3* 2.3*   No results for input(s): LIPASE, AMYLASE in the last 168 hours. No results for input(s): AMMONIA in the last 168 hours. Coagulation Profile: No results for input(s): INR, PROTIME in the last 168 hours. Cardiac Enzymes: Recent Labs  Lab 10/25/17 1029 10/25/17 1601 10/25/17 2157 10/27/17 0322  CKTOTAL  --   --   --  12*  TROPONINI 0.15* 0.15* 0.15*  --    BNP (last 3 results) No results for input(s): PROBNP in the last 8760 hours. HbA1C: No results for input(s): HGBA1C in the last 72 hours. CBG: No results for input(s): GLUCAP in the last 168 hours. Lipid Profile: No results for input(s): CHOL, HDL, LDLCALC, TRIG, CHOLHDL, LDLDIRECT in the last 72 hours. Thyroid Function Tests: No results for input(s): TSH, T4TOTAL, FREET4, T3FREE, THYROIDAB in the last 72 hours. Anemia Panel: No results for input(s): VITAMINB12, FOLATE, FERRITIN, TIBC, IRON, RETICCTPCT in the last 72 hours. Sepsis Labs: Recent Labs  Lab 10/26/17 2002  PROCALCITON 1.25  Recent Results (from the past 240 hour(s))  Culture, blood (routine x 2)     Status: None   Collection Time: 10/19/17  2:56 PM  Result Value Ref Range Status   Specimen Description BLOOD LEFT ANTECUBITAL  Final   Special Requests   Final    BOTTLES DRAWN AEROBIC AND ANAEROBIC Blood Culture results may not be optimal due to an inadequate volume of blood received in culture bottles   Culture NO GROWTH 5 DAYS  Final   Report Status 10/24/2017 FINAL  Final  Culture, blood (routine x 2)     Status: None   Collection Time: 10/19/17  3:00 PM  Result Value Ref Range Status   Specimen Description BLOOD LEFT HAND  Final   Special Requests   Final    BOTTLES DRAWN AEROBIC AND ANAEROBIC Blood  Culture adequate volume   Culture NO GROWTH 5 DAYS  Final   Report Status 10/24/2017 FINAL  Final         Radiology Studies: Dg Chest 2 View  Result Date: 10/27/2017 CLINICAL DATA:  62 year old female with shortness of breath. Osteomyelitis. Acute renal injury recently started dialysis. EXAM: CHEST  2 VIEW COMPARISON:  10/25/2017 and earlier. FINDINGS: AP and lateral views of the chest. Stable right PICC line and right IJ approach dialysis catheter. Normal cardiac size and mediastinal contours. Diffuse chronic pulmonary interstitial markings are chronic to a degree (seen in 2016, but there is superimposed patchy more confluent biapical pulmonary opacity greater on the right. Chronic linear opacity at the left hilum is stable since 2016. Questionable small pleural effusions. Visualized tracheal air column is within normal limits. No acute osseous abnormality identified. IMPRESSION: 1. Nonspecific acute on chronic right greater than left upper lobe pulmonary opacity. Consider multifocal infection superimposed on chronic lung disease. 2. Questionable small pleural effusions. Electronically Signed   By: Genevie Ann M.D.   On: 10/27/2017 11:06        Scheduled Meds: . aspirin EC  81 mg Oral Daily  . calcium acetate  667 mg Oral TID WC  . carvedilol  12.5 mg Oral BID WC  . darbepoetin (ARANESP) injection - DIALYSIS  60 mcg Intravenous Q Thu-HD  . gabapentin  100 mg Oral Daily  . heparin injection (subcutaneous)  5,000 Units Subcutaneous Q12H  . hydrALAZINE  50 mg Oral Q8H  . isosorbide mononitrate  120 mg Oral Daily  . polyethylene glycol  17 g Oral Daily   Continuous Infusions: . sodium chloride    . sodium chloride    . cefTRIAXone (ROCEPHIN)  IV Stopped (10/26/17 1924)  . [START ON 10/28/2017] DAPTOmycin (CUBICIN)  IV       LOS: 17 days    Bonnell Public, MD [facility] Pager [phone]  If 7PM-7AM, please contact night-coverage [email or web address] Password  [facility] 10/27/2017, 3:04 PM

## 2017-10-27 NOTE — Progress Notes (Signed)
Progress Note  Patient Name: Kristen Berry Date of Encounter: 10/27/2017  Primary Cardiologist: Dr. Tamala Julian  Subjective   Breathing better after being dialyzed last two days.   Inpatient Medications    Scheduled Meds: . aspirin EC  81 mg Oral Daily  . calcium acetate  667 mg Oral TID WC  . carvedilol  12.5 mg Oral BID WC  . darbepoetin (ARANESP) injection - DIALYSIS  60 mcg Intravenous Q Thu-HD  . gabapentin  100 mg Oral Daily  . heparin injection (subcutaneous)  5,000 Units Subcutaneous Q12H  . hydrALAZINE  50 mg Oral Q8H  . isosorbide mononitrate  120 mg Oral Daily  . polyethylene glycol  17 g Oral Daily   Continuous Infusions: . sodium chloride    . sodium chloride    . cefTRIAXone (ROCEPHIN)  IV Stopped (10/26/17 1924)  . [START ON 10/28/2017] DAPTOmycin (CUBICIN)  IV     PRN Meds: sodium chloride, sodium chloride, acetaminophen **OR** acetaminophen, alteplase, camphor-menthol **AND** hydrOXYzine, docusate sodium, feeding supplement (NEPRO CARB STEADY), heparin, heparin, heparin, hydrALAZINE, ipratropium-albuterol, lidocaine (PF), lidocaine, lidocaine-prilocaine, nitroGLYCERIN, ondansetron **OR** ondansetron (ZOFRAN) IV, oxyCODONE, pentafluoroprop-tetrafluoroeth, sodium chloride flush, sorbitol, zolpidem   Vital Signs    Vitals:   10/27/17 0500 10/27/17 0528 10/27/17 0700 10/27/17 0800  BP:  (!) 157/53 (!) 158/47 (!) 158/47  Pulse:  79 86 90  Resp:  16 17   Temp:  97.8 F (36.6 C) 98.3 F (36.8 C) 98.3 F (36.8 C)  TempSrc:   Oral Oral  SpO2:  98% (!) 89% 94%  Weight: 226 lb 6.6 oz (102.7 kg)     Height:        Intake/Output Summary (Last 24 hours) at 10/27/2017 1029 Last data filed at 10/27/2017 0925 Gross per 24 hour  Intake 810 ml  Output 2500 ml  Net -1690 ml   Filed Weights   10/26/17 1511 10/26/17 1811 10/27/17 0500  Weight: 229 lb 0.9 oz (103.9 kg) 223 lb 8.7 oz (101.4 kg) 226 lb 6.6 oz (102.7 kg)    Telemetry    NSR - Personally Reviewed  ECG     No AM EKG  Physical Exam   General: Well developed, well nourished, NAD  HEENT: OP clear, mucus membranes moist  SKIN: warm, dry. No rashes. Neuro: No focal deficits  Musculoskeletal: Muscle strength 5/5 all ext  Psychiatric: Mood and affect normal  Neck: No JVD, no carotid bruits, no thyromegaly, no lymphadenopathy.  Lungs:Clear bilaterally, no wheezes, rhonci, crackles Cardiovascular: Regular rate and rhythm. No murmurs, gallops or rubs. Abdomen:Soft. Bowel sounds present. Non-tender.  Extremities: No lower extremity edema. Pulses are 2 + in the bilateral DP/PT.   Labs    Chemistry Recent Labs  Lab 10/26/17 1200 10/26/17 2002 10/27/17 0322  NA 134* 136 137  K 3.5 3.4* 3.3*  CL 96* 98* 98*  CO2 25 26 26   GLUCOSE 143* 219* 117*  BUN 25* 13 18  CREATININE 2.10* 1.48* 1.90*  CALCIUM 8.2* 8.1* 8.0*  ALBUMIN 2.3* 2.3* 2.3*  GFRNONAA 24* 37* 27*  GFRAA 28* 43* 32*  ANIONGAP 13 12 13      Hematology Recent Labs  Lab 10/24/17 0419 10/26/17 1200 10/26/17 2002  WBC 16.2* 21.5* 19.6*  RBC 3.19* 3.26* 3.31*  HGB 9.3* 9.1* 9.3*  HCT 29.3* 29.7* 30.1*  MCV 91.8 91.1 90.9  MCH 29.2 27.9 28.1  MCHC 31.7 30.6 30.9  RDW 19.2* 19.3* 19.2*  PLT 452* 462* 416*    Cardiac  Enzymes Recent Labs  Lab 10/25/17 1029 10/25/17 1601 10/25/17 2157  TROPONINI 0.15* 0.15* 0.15*   No results for input(s): TROPIPOC in the last 168 hours.   BNP No results for input(s): BNP, PROBNP in the last 168 hours.   DDimer No results for input(s): DDIMER in the last 168 hours.   Radiology    No results found.  Cardiac Studies   TTE: 10/17/17  Study Conclusions  - Left ventricle: Septal and apical akinesis. The cavity size was moderately dilated. Wall thickness was increased in a pattern of mild LVH. Systolic function was moderately reduced. The estimated ejection fraction was in the range of 35% to 40%. Doppler parameters are consistent with both elevated  ventricular end-diastolic filling pressure and elevated left atrial filling pressure. - Mitral valve: Moderately calcified, moderately thickened annulus. Moderately thickened leaflets . The findings are consistent with mild stenosis. There was moderate regurgitation. Valve area by continuity equation (using LVOT flow): 1.5 cm^2. - Left atrium: The atrium was mildly dilated. - Atrial septum: No defect or patent foramen ovale was identified. - Pulmonary arteries: PA peak pressure: 55 mm Hg (S).  Patient Profile     62 y.o. female with PMH of systolic HF, HTN, HL, CKD IV, osteomyelitis and PVD who presented with worsening renal function. Developed acute respiratory distress and acute renal failure. Echo 10/17/17 with LVEF=35-40%.   Assessment & Plan    1. Acute on chronic systolic HF/Cardiomyopathy: New diagnosis of cardiomyopathy, with high likelihood of underlying CAD.  We will continue to follow along. She will need definitive ischemic evaluation with cath once it is clear what plans are for long term dialysis.      For questions or updates, please contact [place] Please consult [email or web address] for contact info under Cardiology/STEMI.      Signed, Lauree Chandler, MD  10/27/2017, 10:29 AM

## 2017-10-27 NOTE — Consult Note (Signed)
Reason for Consult:Foot ulcer Referring Physician: [name] is an 62 y.o. female with PAD, CHF, CKD, HTN, hx/o DM. HPI: Kristen Berry has been struggling with ulceration of her 2nd toe for a few months. Kristen Berry's been on multiple rounds of antibiotics with varying success. Kristen Berry has been seeing podiatry for this. Kristen Berry happens to be in the hospital for a CKD exacerbation; this is an incidental issue. Kristen Berry has know PAD but has not had vascular surgery evaluation in over 5 years. Kristen Berry had a similar episode with the 3rd toe on the same foot several years ago that took >24monthof antibiotics to resolve.   Past Medical History:  Diagnosis Date  . Arthritis    "hands, knees" (10/10/2017)  . CHF (congestive heart failure) ([place]   . Chronic kidney disease (CKD), stage IV (severe) ([facility]   . High cholesterol   . History of kidney stones   . Hypertension   . PVD (peripheral vascular disease) ([place]    "LLE; will have OR" (10/10/2017)  . Sleep apnea    "never given mask" (10/10/2017)  . Type II diabetes mellitus ([place]    "no RX anymore" (10/10/2017)    Past Surgical History:  Procedure Laterality Date  . HERNIA REPAIR  1950s  . IR FLUORO GUIDE CV LINE RIGHT  10/19/2017  . IR [place] VASC ACCESS RIGHT  10/19/2017  . SHOULDER OPEN ROTATOR CUFF REPAIR Left   . TUBAL LIGATION      Family History  Problem Relation Age of Onset  . Hypertension Father   . Heart failure Maternal Grandmother   . Heart failure Maternal Grandfather     Social History:  reports that  has never smoked. Kristen Berry has never used smokeless tobacco. Kristen Berry reports that Kristen Berry does not drink alcohol or use drugs.  Allergies:  Allergies  Allergen Reactions  . Crestor [Rosuvastatin Calcium] Other (See Comments)    Leg pain    Medications: I have reviewed the patient's current medications.  Results for orders placed or performed during the hospital encounter of 10/10/17 (from the past 48 hour(s))  Troponin I (q 6hr x 3)     Status:  Abnormal   Collection Time: 10/25/17  4:01 PM  Result Value Ref Range   Troponin I 0.15 (HH) <0.03 ng/mL    Comment: CRITICAL VALUE NOTED.  VALUE IS CONSISTENT WITH PREVIOUSLY REPORTED AND CALLED VALUE.  Troponin I (q 6hr x 3)     Status: Abnormal   Collection Time: 10/25/17  9:57 PM  Result Value Ref Range   Troponin I 0.15 (HH) <0.03 ng/mL    Comment: CRITICAL VALUE NOTED.  VALUE IS CONSISTENT WITH PREVIOUSLY REPORTED AND CALLED VALUE.  CBC with Differential/Platelet     Status: Abnormal   Collection Time: 10/26/17 12:00 PM  Result Value Ref Range   WBC 21.5 (H) 4.0 - 10.5 K/uL   RBC 3.26 (L) 3.87 - 5.11 MIL/uL   Hemoglobin 9.1 (L) 12.0 - 15.0 g/dL   HCT 29.7 (L) 36.0 - 46.0 %   MCV 91.1 78.0 - 100.0 fL   MCH 27.9 26.0 - 34.0 pg   MCHC 30.6 30.0 - 36.0 g/dL   RDW 19.3 (H) 11.5 - 15.5 %   Platelets 462 (H) 150 - 400 K/uL   Neutrophils Relative % 85 %   Neutro Abs 18.4 (H) 1.7 - 7.7 K/uL   Lymphocytes Relative 4 %   Lymphs Abs 0.8 0.7 - 4.0 K/uL   Monocytes Relative  9 %   Monocytes Absolute 1.9 (H) 0.1 - 1.0 K/uL   Eosinophils Relative 2 %   Eosinophils Absolute 0.3 0.0 - 0.7 K/uL   Basophils Relative 0 %   Basophils Absolute 0.1 0.0 - 0.1 K/uL  Renal function panel     Status: Abnormal   Collection Time: 10/26/17 12:00 PM  Result Value Ref Range   Sodium 134 (L) 135 - 145 mmol/L   Potassium 3.5 3.5 - 5.1 mmol/L   Chloride 96 (L) 101 - 111 mmol/L   CO2 25 22 - 32 mmol/L   Glucose, Bld 143 (H) 65 - 99 mg/dL   BUN 25 (H) 6 - 20 mg/dL   Creatinine, Ser 2.10 (H) 0.44 - 1.00 mg/dL   Calcium 8.2 (L) 8.9 - 10.3 mg/dL   Phosphorus 2.8 2.5 - 4.6 mg/dL   Albumin 2.3 (L) 3.5 - 5.0 g/dL   GFR calc non Af Amer 24 (L) >60 mL/min   GFR calc Af Amer 28 (L) >60 mL/min    Comment: (NOTE) The eGFR has been calculated using the CKD EPI equation. This calculation has not been validated in all clinical situations. eGFR's persistently <60 mL/min signify possible Chronic Kidney Disease.     Anion gap 13 5 - 15  Renal function panel     Status: Abnormal   Collection Time: 10/26/17  8:02 PM  Result Value Ref Range   Sodium 136 135 - 145 mmol/L   Potassium 3.4 (L) 3.5 - 5.1 mmol/L   Chloride 98 (L) 101 - 111 mmol/L   CO2 26 22 - 32 mmol/L   Glucose, Bld 219 (H) 65 - 99 mg/dL   BUN 13 6 - 20 mg/dL   Creatinine, Ser 1.48 (H) 0.44 - 1.00 mg/dL   Calcium 8.1 (L) 8.9 - 10.3 mg/dL   Phosphorus 1.7 (L) 2.5 - 4.6 mg/dL   Albumin 2.3 (L) 3.5 - 5.0 g/dL   GFR calc non Af Amer 37 (L) >60 mL/min   GFR calc Af Amer 43 (L) >60 mL/min    Comment: (NOTE) The eGFR has been calculated using the CKD EPI equation. This calculation has not been validated in all clinical situations. eGFR's persistently <60 mL/min signify possible Chronic Kidney Disease.    Anion gap 12 5 - 15  CBC     Status: Abnormal   Collection Time: 10/26/17  8:02 PM  Result Value Ref Range   WBC 19.6 (H) 4.0 - 10.5 K/uL   RBC 3.31 (L) 3.87 - 5.11 MIL/uL   Hemoglobin 9.3 (L) 12.0 - 15.0 g/dL   HCT 30.1 (L) 36.0 - 46.0 %   MCV 90.9 78.0 - 100.0 fL   MCH 28.1 26.0 - 34.0 pg   MCHC 30.9 30.0 - 36.0 g/dL   RDW 19.2 (H) 11.5 - 15.5 %   Platelets 416 (H) 150 - 400 K/uL  Procalcitonin - Baseline     Status: None   Collection Time: 10/26/17  8:02 PM  Result Value Ref Range   Procalcitonin 1.25 ng/mL    Comment:        Interpretation: PCT > 0.5 ng/mL and <= 2 ng/mL: Systemic infection (sepsis) is possible, but other conditions are known to elevate PCT as well. (NOTE)       Sepsis PCT Algorithm           Lower Respiratory Tract                                        Infection PCT Algorithm    ----------------------------     ----------------------------         PCT < 0.25 ng/mL                PCT < 0.10 ng/mL         Strongly encourage             Strongly discourage   discontinuation of antibiotics    initiation of antibiotics    ----------------------------     -----------------------------       PCT 0.25 -  0.50 ng/mL            PCT 0.10 - 0.25 ng/mL               OR       >80% decrease in PCT            Discourage initiation of                                            antibiotics      Encourage discontinuation           of antibiotics    ----------------------------     -----------------------------         PCT >= 0.50 ng/mL              PCT 0.26 - 0.50 ng/mL                AND       <80% decrease in PCT             Encourage initiation of                                             antibiotics       Encourage continuation           of antibiotics    ----------------------------     -----------------------------        PCT >= 0.50 ng/mL                  PCT > 0.50 ng/mL               AND         increase in PCT                  Strongly encourage                                      initiation of antibiotics    Strongly encourage escalation           of antibiotics                                     -----------------------------                                           PCT <= 0.25 ng/mL                                                   OR                                        > 80% decrease in PCT                                     Discontinue / Do not initiate                                             antibiotics   CK     Status: Abnormal   Collection Time: 10/27/17  3:22 AM  Result Value Ref Range   Total CK 12 (L) 38 - 234 U/L  Renal function panel     Status: Abnormal   Collection Time: 10/27/17  3:22 AM  Result Value Ref Range   Sodium 137 135 - 145 mmol/L   Potassium 3.3 (L) 3.5 - 5.1 mmol/L   Chloride 98 (L) 101 - 111 mmol/L   CO2 26 22 - 32 mmol/L   Glucose, Bld 117 (H) 65 - 99 mg/dL   BUN 18 6 - 20 mg/dL   Creatinine, Ser 1.90 (H) 0.44 - 1.00 mg/dL   Calcium 8.0 (L) 8.9 - 10.3 mg/dL   Phosphorus 2.2 (L) 2.5 - 4.6 mg/dL   Albumin 2.3 (L) 3.5 - 5.0 g/dL   GFR calc non Af Amer 27 (L) >60 mL/min   GFR calc Af Amer 32 (L) >60 mL/min    Comment: (NOTE) The eGFR  has been calculated using the CKD EPI equation. This calculation has not been validated in all clinical situations. eGFR's persistently <60 mL/min signify possible Chronic Kidney Disease.    Anion gap 13 5 - 15  Sedimentation rate     Status: Abnormal   Collection Time: 10/27/17  3:22 AM  Result Value Ref Range   Sed Rate 127 (H) 0 - 22 mm/hr  C-reactive protein     Status: Abnormal   Collection Time: 10/27/17  3:22 AM  Result Value Ref Range   CRP 18.8 (H) <1.0 mg/dL    Dg Chest 2 View  Result Date: 10/27/2017 CLINICAL DATA:  62 year old female with shortness of breath. Osteomyelitis. Acute renal injury recently started dialysis. EXAM: CHEST  2 VIEW COMPARISON:  10/25/2017 and earlier. FINDINGS: AP and lateral views of the chest. Stable right PICC line and right IJ approach dialysis catheter. Normal cardiac size and mediastinal contours. Diffuse chronic pulmonary interstitial markings are chronic to a degree (seen in 2016, but there is superimposed patchy more confluent biapical pulmonary opacity greater on the right. Chronic linear opacity at the left hilum is stable since 2016. Questionable small pleural effusions. Visualized tracheal air column is within normal limits. No acute osseous abnormality identified. IMPRESSION: 1. Nonspecific acute on chronic right greater than left upper lobe pulmonary opacity. Consider multifocal infection superimposed on chronic lung disease. 2. Questionable small pleural effusions. Electronically Signed   By: Genevie Ann M.D.   On: 10/27/2017 11:06    Review of Systems  Constitutional: Negative for chills, fever and weight loss.  HENT: Negative for ear discharge, ear pain, hearing loss and tinnitus.   Eyes: Negative for blurred vision, double vision, photophobia and pain.  Respiratory: Positive  for shortness of breath. Negative for cough and sputum production.   Cardiovascular: Negative for chest pain.  Gastrointestinal: Negative for abdominal pain, nausea  and vomiting.  Genitourinary: Negative for dysuria, flank pain, frequency and urgency.  Musculoskeletal: Positive for joint pain (Occ left foot). Negative for back pain, falls, myalgias and neck pain.  Neurological: Negative for dizziness, tingling, sensory change, focal weakness, loss of consciousness and headaches.  Endo/Heme/Allergies: Does not bruise/bleed easily.  Psychiatric/Behavioral: Negative for depression, memory loss and substance abuse. The patient is not nervous/anxious.    Blood pressure (!) 132/54, pulse 88, temperature 98.6 F (37 C), temperature source Oral, resp. rate 18, height 5' 8" (1.727 m), weight 102.7 kg (226 lb 6.6 oz), SpO2 96 %. Physical Exam  Constitutional: Kristen Berry appears well-developed and well-nourished. No distress.  HENT:  Head: Normocephalic and atraumatic.  Eyes: Conjunctivae are normal. Right eye exhibits no discharge. Left eye exhibits no discharge. No scleral icterus.  Neck: Normal range of motion.  Cardiovascular: Normal rate and regular rhythm.  Respiratory: Effort normal. No respiratory distress.  Musculoskeletal:  LLE No traumatic wounds, ecchymosis, or rash  2nd toe edematous with ulceration, erythema, foul odor  No knee or ankle effusion  Knee stable to varus/ valgus and anterior/posterior stress  Sens DPN, TN intact, SPN paresthetic  Motor EHL, ext, flex, evers 5/5  DP 0, PT 0, No significant edema, foot warm  Neurological: Kristen Berry is alert.  Skin: Skin is warm and dry. Kristen Berry is not diaphoretic.  Psychiatric: Kristen Berry has a normal mood and affect. Her behavior is normal.    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 Will have Dr. Sharol Given evaluate on Monday when he gets back in town. Continue broad-spectrum antibiotics for suppression. In the meantime Kristen Berry needs a vascular surgery consult to evaluate possibility of revascularization.    Lisette Abu, PA-C [facility]opedic  Sur[facility] [phone] 10/27/2017, 3:20 PM

## 2017-10-27 NOTE — Progress Notes (Signed)
Physical Therapy Treatment Patient Details Name: Kristen Berry MRN: [id number] DOB: 14-Jan-1956 Today's Date: 10/27/2017    History of Present Illness Patient is a 62 year old female who presented to the hospital with shortness of breath. She was found to have an acute kidney injury. PMH: PVD, HTN, CHF CKD, OA     PT Comments    Progressing very slowly; pt with multiple reasons as to why she cannot get OOB, pt is apologetic, attempted to encourage pt to work on mobility  However she was only agreeable to exercises;   Follow Up Recommendations  Home health PT     Equipment Recommendations  Rolling walker with 5" wheels    Recommendations for Other Services       Precautions / Restrictions Precautions Precautions: Fall Restrictions Weight Bearing Restrictions: No    Mobility  Bed Mobility               General bed mobility comments: pt refused OOB/amb/ up to recliner  Transfers                    Ambulation/Gait                 Stairs            Wheelchair Mobility    Modified Rankin (Stroke Patients Only)       Balance                                            Cognition Arousal/Alertness: Awake/alert Behavior During Therapy: WFL for tasks assessed/performed Overall Cognitive Status: Within Functional Limits for tasks assessed                                        Exercises General Exercises - Lower Extremity Ankle Circles/Pumps: AROM;Both;10 reps Quad Sets: AROM;Strengthening;Both;10 reps Gluteal Sets: AROM;Strengthening;Both;10 reps Heel Slides: AROM;Both;10 reps Hip ABduction/ADduction: AROM;Both;10 reps Other Exercises Other Exercises: bil UE exercises using level 2 theraband elbow flexion/extension for 10reps  each motion--pt able to return demo exercises  that were taught by OT    General Comments        Pertinent Vitals/Pain Pain Assessment: No/denies pain    Home Living                       Prior Function            PT Goals (current goals can now be found in the care plan section) Acute Rehab PT Goals Patient Stated Goal: Feel stronger  PT Goal Formulation: With patient Time For Goal Achievement: 11/07/17 Potential to Achieve Goals: Good Progress towards PT goals: Not progressing toward goals - comment(non-compliance with mobility/PT, multiple refusals)    Frequency    Min 3X/week      PT Plan Current plan remains appropriate    Co-evaluation              AM-PAC PT "6 Clicks" Daily Activity  Outcome Measure  Difficulty turning over in bed (including adjusting bedclothes, sheets and blankets)?: A Little Difficulty moving from lying on back to sitting on the side of the bed? : A Little Difficulty sitting down on and standing up from a chair with arms (e.g., wheelchair, bedside commode, etc,.)?: A  Little Help needed moving to and from a bed to chair (including a wheelchair)?: A Little Help needed walking in hospital room?: A Little Help needed climbing 3-5 steps with a railing? : A Little 6 Click Score: 18    End of Session   Activity Tolerance: Patient limited by fatigue Patient left: in bed;with call bell/phone within reach;with bed alarm set   PT Visit Diagnosis: Unsteadiness on feet (R26.81);Difficulty in walking, not elsewhere classified (R26.2)     Time: 7639-4320 PT Time Calculation (min) (ACUTE ONLY): 14 min  Charges:  $Therapeutic Exercise: 8-22 mins                    G CodesKenyon Ana, PT Pager: [phone] 10/27/2017    Kenyon Ana 10/27/2017, 2:50 PM

## 2017-10-27 NOTE — Consult Note (Signed)
Hospital Consult    Reason for Consult:  L 2nd toe gangrene with osteomyelitis Requesting Physician:  Dr. Marthenia Rolling MRN #:  [id number]  History of Present Illness: This is a 62 y.o. female past medical history significant for diabetes mellitus, hypertension, CHF, CAD, CKD stage IV proceed with hemodialysis catheter right IJ.  She is seen in consultation due to left second toe ulceration with osteomyelitis for which patient has received a right arm PICC line at [facility].  She states that the ulceration of her left toe started about 2 months ago.  She denies claudication and rest pain of left foot.  Patient is somewhat however she requires a walker.  Her patient she was told by nephrology that she has a chance to recover her kidney function.  No vascular imaging studies have been performed during this hospitalization.  She denies tobacco use.  She takes an aspirin daily.  She is intolerant to statin side effect profile.  Patient is R handed.  Past Medical History:  Diagnosis Date  . Arthritis    "hands, knees" (10/10/2017)  . CHF (congestive heart failure) ([place])   . Chronic kidney disease (CKD), stage IV (severe) ([place])   . High cholesterol   . History of kidney stones   . Hypertension   . PVD (peripheral vascular disease) ([place])    "LLE; will have OR" (10/10/2017)  . Sleep apnea    "never given mask" (10/10/2017)  . Type II diabetes mellitus ([place])    "no RX anymore" (10/10/2017)    Past Surgical History:  Procedure Laterality Date  . HERNIA REPAIR  1950s  . IR FLUORO GUIDE CV LINE RIGHT  10/19/2017  . IR US GUIDE VASC ACCESS RIGHT  10/19/2017  . SHOULDER OPEN ROTATOR CUFF REPAIR Left   . TUBAL LIGATION      Allergies  Allergen Reactions  . Crestor [Rosuvastatin Calcium] Other (See Comments)    Leg pain    Prior to Admission medications   Medication Sig Start Date End Date Taking? Authorizing Provider  acetaminophen (TYLENOL) 500 MG tablet Take 1,500 mg by mouth at bedtime as needed  for headache (pain).   Yes [provider]  albuterol (PROAIR HFA) 108 (90 Base) MCG/ACT inhaler Inhale 2 puffs into the lungs every 6 (six) hours as needed for wheezing or shortness of breath.   Yes [provider]  amLODipine (NORVASC) 10 MG tablet Take 10 mg by mouth at bedtime.   Yes [provider]  aspirin EC 81 MG tablet Take 81 mg by mouth at bedtime.    Yes [provider]  furosemide (LASIX) 40 MG tablet Take 40 mg by mouth daily.    Yes [provider]  gabapentin (NEURONTIN) 100 MG capsule Take 100 mg by mouth at bedtime as needed (nerve pain).    Yes [provider]  hydrALAZINE (APRESOLINE) 50 MG tablet Take 50 mg by mouth 2 (two) times daily.   Yes [provider]  isosorbide mononitrate (IMDUR) 60 MG 24 hr tablet Take 60 mg by mouth at bedtime.    Yes [provider]  labetalol (NORMODYNE) 200 MG tablet Take 400 mg by mouth 2 (two) times daily. 2 tablets BID    Yes [provider]  losartan (COZAAR) 100 MG tablet Take 100 mg by mouth at bedtime.   Yes [provider]  PRESCRIPTION MEDICATION Inject into the vein daily at 12 noon.   Yes [provider]  cefTRIAXone (ROCEPHIN) IVPB Inject 2 g  into the vein daily. Indication:  osteomyelitis Last Day of Therapy:  11/11/17 Labs - Once weekly:  CBC/D and BMP, Labs - Every other week:  ESR and CRP 10/14/17   Rosita Fire, MD  daptomycin (CUBICIN) IVPB Inject 650 mg into the vein every other day. Indication:  Osteomyelitis  Last Day of Therapy:  11/11/17 Labs - Once weekly:  CBC/D, BMP, and CPK Labs - Every other week:  ESR and CRP 10/14/17   Rosita Fire, MD  sodium bicarbonate 650 MG tablet Take 2 tablets (1,300 mg total) by mouth 2 (two) times daily. 10/14/17   Rosita Fire, MD    Social History   Socioeconomic History  . Marital status: Divorced    Spouse name: Not on file  . Number of children: 3  . Years  of education: Not on file  . Highest education level: Not on file  Social Needs  . Financial resource strain: Not on file  . Food insecurity - worry: Not on file  . Food insecurity - inability: Not on file  . Transportation needs - medical: Not on file  . Transportation needs - non-medical: Not on file  Occupational History  . Occupation: disabled  Tobacco Use  . Smoking status: Never Smoker  . Smokeless tobacco: Never Used  Substance and Sexual Activity  . Alcohol use: No  . Drug use: No  . Sexual activity: No  Other Topics Concern  . Not on file  Social History Narrative  . Not on file     Family History  Problem Relation Age of Onset  . Hypertension Father   . Heart failure Maternal Grandmother   . Heart failure Maternal Grandfather     ROS: Otherwise negative unless mentioned in HPI  Physical Examination  Vitals:   10/27/17 0800 10/27/17 1414  BP: (!) 158/47 (!) 132/54  Pulse: 90 88  Resp:  18  Temp: 98.3 F (36.8 C) 98.6 F (37 C)  SpO2: 94% 96%   Body mass index is 34.43 kg/m.  General:  WDWN in NAD Gait: Not observed HENT: WNL, normocephalic Pulmonary: normal non-labored breathing, without Rales, rhonchi,  wheezing Cardiac: regular, without  Murmurs, rubs or gallops; without carotid bruits Abdomen:  soft, NT/ND, no masses Skin: without rashes Vascular Exam/Pulses: Palpable and symmetrical femoral pulses; no palpable pedal pulses; left AT by Doppler, weak L PT by Doppler Extremities: Left 2nd toe ulceration with odor, no active drainage, no surrounding erythema; no lymphangitis; feet symmetrically warm to touch Musculoskeletal: no muscle wasting or atrophy  Neurologic: A&O X 3;  No focal weakness or paresthesias are detected; speech is fluent/normal Psychiatric:  The pt has Normal affect. Lymph:  Unremarkable  CBC    Component Value Date/Time   WBC 19.6 (H) 10/26/2017 2002   RBC 3.31 (L) 10/26/2017 2002   HGB 9.3 (L) 10/26/2017 2002   HGB 14.3  12/14/2016 1139   HCT 30.1 (L) 10/26/2017 2002   HCT 44.2 12/14/2016 1139   PLT 416 (H) 10/26/2017 2002   PLT 429 (H) 12/14/2016 1139   MCV 90.9 10/26/2017 2002   MCV 88.5 12/14/2016 1139   MCH 28.1 10/26/2017 2002   MCHC 30.9 10/26/2017 2002   RDW 19.2 (H) 10/26/2017 2002   RDW 17.6 (H) 12/14/2016 1139   LYMPHSABS 0.8 10/26/2017 1200   LYMPHSABS 1.9 12/14/2016 1139   MONOABS 1.9 (H) 10/26/2017 1200   MONOABS 0.7 12/14/2016 1139   EOSABS 0.3 10/26/2017 1200   EOSABS 0.0 10/17/2017  1430   BASOSABS 0.1 10/26/2017 1200   BASOSABS 0.1 12/14/2016 1139    BMET    Component Value Date/Time   NA 137 10/27/2017 0322   NA 137 12/14/2016 1139   K 3.3 (L) 10/27/2017 0322   K 4.2 12/14/2016 1139   CL 98 (L) 10/27/2017 0322   CO2 26 10/27/2017 0322   CO2 20 (L) 12/14/2016 1139   GLUCOSE 117 (H) 10/27/2017 0322   GLUCOSE 115 12/14/2016 1139   BUN 18 10/27/2017 0322   BUN 32.8 (H) 12/14/2016 1139   CREATININE 1.90 (H) 10/27/2017 0322   CREATININE 2.1 (H) 12/14/2016 1139   CALCIUM 8.0 (L) 10/27/2017 0322   CALCIUM 9.5 12/14/2016 1139   GFRNONAA 27 (L) 10/27/2017 0322   GFRAA 32 (L) 10/27/2017 0322    COAGS: No results found for: INR, PROTIME   Non-Invasive Vascular Imaging:   Pending LLE arterial duplex with ABIs  Statin:  No. Beta Blocker:  Yes.   Aspirin:  Yes.   ACEI:  No. ARB:  Yes.   CCB use:  Yes Other antiplatelets/anticoagulants:  No.    ASSESSMENT/PLAN: This is a 62 y.o. female with osteomyelitis and ulceration of L 2nd toe  IV antibiotics per primary team via PICC line CKD stage IV receiving HD from temp cath R IJ Doubtful patient has ability to heal L 2nd toe given PMH as well as failed IV antibiotic regimen With chance of kidney function improvement we will start with LLE duplex with ABIs to workup PAD LLE angiography will be needed once dialysis needs long term become more clear This will also affect dialysis access; would recommend avoiding any venous  puncture above L hand for the time being On call surgeon will evaluate patient later today  Dagoberto Ligas PA-C [facility] [phone]   I have independently interviewed and examined patient and agree with PA assessment and plan above. Will need to have angiogram when ok per nephrology and will also need to amputation and ortho is following now although I would be happy to do it if needed from a timing standpoint . Will start with ABI and left lower extremity duplex. Continue abx. Would wait for tunneled catheter until toe removed. No sticks in left arm in case of need for permanent hd access.    C. Donzetta Matters, MD [facility] [place] Office: [phone] Pager: [phone]

## 2017-10-27 NOTE — Progress Notes (Signed)
Patient returned to room  from hemodialysis. Alert and oriented x4.  Vital signs stable and in no  distress.

## 2017-10-28 ENCOUNTER — Inpatient Hospital Stay (HOSPITAL_COMMUNITY): Payer: Medicare HMO

## 2017-10-28 DIAGNOSIS — I82409 Acute embolism and thrombosis of unspecified deep veins of unspecified lower extremity: Secondary | ICD-10-CM

## 2017-10-28 DIAGNOSIS — I96 Gangrene, not elsewhere classified: Secondary | ICD-10-CM

## 2017-10-28 DIAGNOSIS — I739 Peripheral vascular disease, unspecified: Secondary | ICD-10-CM

## 2017-10-28 DIAGNOSIS — I5023 Acute on chronic systolic (congestive) heart failure: Secondary | ICD-10-CM

## 2017-10-28 HISTORY — DX: Acute embolism and thrombosis of unspecified deep veins of unspecified lower extremity: I82.409

## 2017-10-28 LAB — RENAL FUNCTION PANEL
Albumin: 2.4 g/dL — ABNORMAL LOW (ref 3.5–5.0)
Anion gap: 12 (ref 5–15)
BUN: 19 mg/dL (ref 6–20)
CO2: 25 mmol/L (ref 22–32)
Calcium: 8 mg/dL — ABNORMAL LOW (ref 8.9–10.3)
Chloride: 99 mmol/L — ABNORMAL LOW (ref 101–111)
Creatinine, Ser: 2.29 mg/dL — ABNORMAL HIGH (ref 0.44–1.00)
GFR calc Af Amer: 25 mL/min — ABNORMAL LOW (ref >60)
GFR calc non Af Amer: 22 mL/min — ABNORMAL LOW (ref >60)
Glucose, Bld: 94 mg/dL (ref 65–99)
Phosphorus: 2.5 mg/dL (ref 2.5–4.6)
Potassium: 3.9 mmol/L (ref 3.5–5.1)
Sodium: 136 mmol/L (ref 135–145)

## 2017-10-28 LAB — CK: Total CK: 16 U/L — ABNORMAL LOW (ref 38–234)

## 2017-10-28 MED ORDER — HYDRALAZINE HCL 50 MG PO TABS
100.0000 mg | ORAL_TABLET | Freq: Three times a day (TID) | ORAL | Status: DC
  Administered 2017-10-28 – 2017-11-04 (×17): 100 mg via ORAL
  Filled 2017-10-28 (×18): qty 2

## 2017-10-28 MED ORDER — HEPARIN (PORCINE) IN NACL 100-0.45 UNIT/ML-% IJ SOLN
2700.0000 [IU]/h | INTRAMUSCULAR | Status: DC
  Administered 2017-10-28: 1500 [IU]/h via INTRAVENOUS
  Administered 2017-10-29: 1800 [IU]/h via INTRAVENOUS
  Administered 2017-10-30: 1900 [IU]/h via INTRAVENOUS
  Administered 2017-10-30: 2000 [IU]/h via INTRAVENOUS
  Administered 2017-10-31: 2300 [IU]/h via INTRAVENOUS
  Filled 2017-10-28 (×11): qty 250

## 2017-10-28 NOTE — Procedures (Signed)
Late entry: Stable hemodynamics on dialysis and removing fluid with symptomatic improvement Estanislado Emms

## 2017-10-28 NOTE — Progress Notes (Signed)
Assessment/ Plan:    1. Acute kidney injury on chronic kidney disease stage(cr 2.4)IV:Appears ESRD -9 liters removed over last 3 days.  I believe she is best served by dialysis(prob long term) and have informed her this.  I think we should proceed with cardiovascular evaluation as appropriate.  Will need AV access before discharge.  2. Acute diastolic CHF: improved with dialysis  3. Osteomyelitis second left toe/cellulitis: ongoing intravenous antibiotic   Subjective: Interval History: Breathing better  Objective: Vital signs in last 24 hours: Temp:  [97.4 F (36.3 C)-98.6 F (37 C)] 97.6 F (36.4 C) (02/01 0558) Pulse Rate:  [74-89] 81 (02/01 0558) Resp:  [17-22] 19 (02/01 0558) BP: (132-174)/(54-74) 168/54 (02/01 0625) SpO2:  [90 %-100 %] 100 % (02/01 0558) Weight:  [98.4 kg (216 lb 14.9 oz)-101 kg (222 lb 10.6 oz)] 99 kg (218 lb 4.1 oz) (02/01 0500) Weight change: -2.9 kg (-6.3 oz)  Intake/Output from previous day: 01/31 0701 - 02/01 0700 In: 320 [P.O.:200] Out: 3000  Intake/Output this shift: No intake/output data recorded.  General appearance: alert and cooperative Resp: diminished breath sounds bilaterally and poor effortCardio: regular rate and rhythm, S1, S2 normal, no murmur, click, rub or gallop GI: no liver tenderness now Extremities: edema 1+ to tr [facility] right chest  Lab Results: Recent Labs    10/26/17 2002 10/27/17 1730  WBC 19.6* 18.3*  HGB 9.3* 8.6*  HCT 30.1* 28.6*  PLT 416* 445*   BMET:  Recent Labs    10/27/17 0322 10/28/17 0436  NA 137 136  K 3.3* 3.9  CL 98* 99*  CO2 26 25  GLUCOSE 117* 94  BUN 18 19  CREATININE 1.90* 2.29*  CALCIUM 8.0* 8.0*   No results for input(s): PTH in the last 72 hours. Iron Studies: No results for input(s): IRON, TIBC, TRANSFERRIN, FERRITIN in the last 72 hours. Studies/Results: Dg Chest 2 View  Result Date: 10/27/2017 CLINICAL DATA:  61 year old female with shortness of breath. Osteomyelitis. Acute  renal injury recently started dialysis. EXAM: CHEST  2 VIEW COMPARISON:  10/25/2017 and earlier. FINDINGS: AP and lateral views of the chest. Stable right PICC line and right IJ approach dialysis catheter. Normal cardiac size and mediastinal contours. Diffuse chronic pulmonary interstitial markings are chronic to a degree (seen in 2016, but there is superimposed patchy more confluent biapical pulmonary opacity greater on the right. Chronic linear opacity at the left hilum is stable since 2016. Questionable small pleural effusions. Visualized tracheal air column is within normal limits. No acute osseous abnormality identified. IMPRESSION: 1. Nonspecific acute on chronic right greater than left upper lobe pulmonary opacity. Consider multifocal infection superimposed on chronic lung disease. 2. Questionable small pleural effusions. Electronically Signed   By: Genevie Ann M.D.   On: 10/27/2017 11:06    Scheduled: . aspirin EC  81 mg Oral Daily  . calcium acetate  667 mg Oral TID WC  . carvedilol  12.5 mg Oral BID WC  . darbepoetin (ARANESP) injection - DIALYSIS  60 mcg Intravenous Q Thu-HD  . gabapentin  100 mg Oral Daily  . heparin injection (subcutaneous)  5,000 Units Subcutaneous Q12H  . hydrALAZINE  50 mg Oral Q8H  . isosorbide mononitrate  120 mg Oral Daily  . polyethylene glycol  17 g Oral Daily   Continuous: . cefTRIAXone (ROCEPHIN)  IV Stopped (10/26/17 1924)  . DAPTOmycin (CUBICIN)  IV        LOS: 18 days   Estanislado Emms 10/28/2017,9:26 AM

## 2017-10-28 NOTE — Progress Notes (Signed)
Lower extremity Doppler was positive for bilateral posterior tibial vein and left  distal femoral vein DVT, she has no signs of overt bleeding, will start on IV heparin. Discussed with cardiology, also discussed with vascular and orthopedic surgery.

## 2017-10-28 NOTE — Progress Notes (Signed)
ANTICOAGULATION CONSULT NOTE - Initial Consult  Pharmacy Consult for heparin Indication: DVT  Allergies  Allergen Reactions  . Crestor [Rosuvastatin Calcium] Other (See Comments)    Leg pain   Patient Measurements: Height: 5\' 8"  (172.7 cm) Weight: 218 lb 4.1 oz (99 kg) IBW/kg (Calculated) : 63.9 Heparin Dosing Weight: 87.7 kg  Assessment: 62 yo F presents on 1/14 with worsening renal function. Treating for left toe osteo with dapto and ceftriaxone. Transferred to [facility] for further management. Now found to have DVT on doppler. Last dose of [place] heparin was at 1030 this am. Hgb 8.6, plts 445.  Goal of Therapy:  Heparin level 0.3-0.7 units/ml Monitor platelets by anticoagulation protocol: Yes   Plan:  No heparin bolus Start heparin gtt at 1,500 units/hr Monitor daily heparin level, CBC, s/s of bleed  Kolbie Clarkston J 10/28/2017,1:05 PM

## 2017-10-28 NOTE — Progress Notes (Signed)
Assessment/ Plan:    1. Acute kidney injury on chronic kidney disease stage(cr 2.4)IV:Suffered hemodynamically mediatedAKIthis hosp. 2. Acute diastolic CHF:  will dialyze again  3. Osteomyelitis second left toe/cellulitis: ongoing intravenous antibiotic    Subjective: Interval History: {Still w/SOB  Objective: Vital signs in last 24 hours: Temp:  [97.4 F (36.3 C)-98.6 F (37 C)] 97.6 F (36.4 C) (02/01 0558) Pulse Rate:  [74-89] 81 (02/01 0558) Resp:  [17-22] 19 (02/01 0558) BP: (132-174)/(54-74) 168/54 (02/01 0625) SpO2:  [90 %-100 %] 100 % (02/01 0558) Weight:  [98.4 kg (216 lb 14.9 oz)-101 kg (222 lb 10.6 oz)] 99 kg (218 lb 4.1 oz) (02/01 0500) Weight change: -2.9 kg (-6.3 oz)  Intake/Output from previous day: 01/31 0701 - 02/01 0700 In: 320 [P.O.:200] Out: 3000  Intake/Output this shift: No intake/output data recorded.  General appearance: alert, cooperative and mild distress Resp: rhonchi bilaterally Cardio: regular rate and rhythm, S1, S2 normal, no murmur, click, rub or gallop Extremities: edema tr   Lab Results: Recent Labs    10/26/17 2002 10/27/17 1730  WBC 19.6* 18.3*  HGB 9.3* 8.6*  HCT 30.1* 28.6*  PLT 416* 445*   BMET:  Recent Labs    10/27/17 0322 10/28/17 0436  NA 137 136  K 3.3* 3.9  CL 98* 99*  CO2 26 25  GLUCOSE 117* 94  BUN 18 19  CREATININE 1.90* 2.29*  CALCIUM 8.0* 8.0*   No results for input(s): PTH in the last 72 hours. Iron Studies: No results for input(s): IRON, TIBC, TRANSFERRIN, FERRITIN in the last 72 hours. Studies/Results: Dg Chest 2 View  Result Date: 10/27/2017 CLINICAL DATA:  62 year old female with shortness of breath. Osteomyelitis. Acute renal injury recently started dialysis. EXAM: CHEST  2 VIEW COMPARISON:  10/25/2017 and earlier. FINDINGS: AP and lateral views of the chest. Stable right PICC line and right IJ approach dialysis catheter. Normal cardiac size and mediastinal contours. Diffuse chronic  pulmonary interstitial markings are chronic to a degree (seen in 2016, but there is superimposed patchy more confluent biapical pulmonary opacity greater on the right. Chronic linear opacity at the left hilum is stable since 2016. Questionable small pleural effusions. Visualized tracheal air column is within normal limits. No acute osseous abnormality identified. IMPRESSION: 1. Nonspecific acute on chronic right greater than left upper lobe pulmonary opacity. Consider multifocal infection superimposed on chronic lung disease. 2. Questionable small pleural effusions. Electronically Signed   By: Genevie Ann M.D.   On: 10/27/2017 11:06    Scheduled: . aspirin EC  81 mg Oral Daily  . calcium acetate  667 mg Oral TID WC  . carvedilol  12.5 mg Oral BID WC  . darbepoetin (ARANESP) injection - DIALYSIS  60 mcg Intravenous Q Thu-HD  . gabapentin  100 mg Oral Daily  . heparin injection (subcutaneous)  5,000 Units Subcutaneous Q12H  . hydrALAZINE  50 mg Oral Q8H  . isosorbide mononitrate  120 mg Oral Daily  . polyethylene glycol  17 g Oral Daily     LOS: 18 days   Kristen Berry 10/28/2017,9:33 AM

## 2017-10-28 NOTE — Progress Notes (Signed)
   I have discussed with Drs. Florene Glen and [place] and will plan for coronary evaluation early next week and will subsequently need aortogram and left lower extremity possible intervention. Will plan tdc after toe is removed.   Heydy Montilla C. Donzetta Matters, MD [facility] Office: [phone] Pager: [phone]

## 2017-10-28 NOTE — Procedures (Deleted)
Late entry: Stable hemodynamics on dialysis and removing fluid with symptomatic improvement Kristen Berry

## 2017-10-28 NOTE — Progress Notes (Signed)
[facility] PROGRESS NOTE    Progress Note  Kristen Berry  DDU:[id number] DOB: 06/08/1956 DOA: 10/10/2017 PCP: [place], Sarah T, MD     Brief Narrative:   Kristen Berry is an 62 y.o. 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  Transferred to [facility] due to worsening renal failure, temporary dialysis catheter was placed on 10/19/2016, the patient started dialysis on 10/20/2016, orthopedic and vascular surgery was consulted due to persistent leukocytosis, cardiology was consulted and discussed with vascular he will performed coronary angiogram and selective left lower and right lower extremity angiogram for possible revascularization, to then be evaluated by orthopedic surgery for possible left second toe amputation  Assessment/Plan:   ARF (acute renal failure) ([place]) Renal was consulted and patient undergoing dialysis. Temporary catheter placed on 10/19/2016. Nephrology recommended a venous access before discharge will defer to vascular surgery. Nephrology agreed with cardiac cath and surgical amputation of the toe  Left second toe ulceration with distal phalangeal osteomyelitis/possible critical limb ischemia: Orthopedic surgery was consulted who recommended to patient to be evaluated by Dr. Sharol Given on 10/30/2016, he agree with broad-spectrum antibiotics. Vascular surgery was consulted who recommended an angiogram,recommended an ABI which has already been ordered, and also agree with empiric antibiotics Cardiology was consulted, and the patient will undergo coronary cath early next week with subsequent aortogram and left lower extremity angiogram for possible  revascularization. Cultures negative till date, ESR continues to be elevated  Anemia of chronic disease: Continue IV iron and Aranesp.  Shortness of breath/acute pulmonary edema/acute systolic heart failure: With an EF of 35%, improved with dialysis, patient has been continued on aspirin hydralazine Imdur and Lasix Plan for ischemic evaluation by cardiology.  Shortness of breath has resolved  Type 2 diabetes mellitus with neurologic complication, without long-term current use of insulin (HCC) Continue sliding scale insulin A1c 5.5  Essential hypertension: Blood pressure not at goal Continue Coreg, hydralazine, increase hydralazine, continue Imdur. Continue titrate medications as tolerated.  Persistent leukocytosis: Likely due to osteomyelitis blood cultures have remained negative.  Physical deconditioning: PT OT to be  consulted for after procedure.   Chronic kidney disease, stage IV (severe) (HCC)   DVT prophylaxis: lovenox Family Communication:none Disposition Plan/Barrier to D/C: unable to determine Code Status:     Code Status Orders  (From admission, onward)        Start     Ordered   10/10/17 2037  Full code  Continuous     10/10/17 2036    Code Status History    Date Active Date Inactive Code Status Order ID Comments User Context   06/28/2013 04:57 06/30/2013 18:52 Full Code [id number]  Orvan Falconer, MD Inpatient        IV Access:    Peripheral IV   Procedures and diagnostic studies:   Dg Chest 2 View  Result Date: 10/27/2017 CLINICAL DATA:  62 year old female with shortness of breath. Osteomyelitis. Acute renal injury recently started dialysis. EXAM: CHEST  2 VIEW COMPARISON:  10/25/2017 and earlier. FINDINGS: AP and lateral views of the chest. Stable right PICC line and right IJ approach dialysis catheter. Normal cardiac size and mediastinal contours. Diffuse chronic pulmonary interstitial markings are chronic to a degree (seen in 2016, but there is  superimposed patchy more confluent biapical pulmonary opacity greater on the right. Chronic linear opacity at the left hilum is stable since 2016. Questionable small pleural effusions. Visualized tracheal air column is within normal limits. No acute osseous abnormality identified. IMPRESSION: 1. Nonspecific acute on chronic right greater than left upper lobe pulmonary opacity. Consider multifocal infection superimposed on chronic lung disease. 2. Questionable small pleural effusions. Electronically Signed   By: Genevie Ann M.D.   On: 10/27/2017 11:06     Medical Consultants:    None.  Anti-Infectives:   IV Rocephin and daptomycin  Subjective:    [name] no new complains.  Objective:    Vitals:   10/27/17 2148 10/28/17 0500 10/28/17 0558 10/28/17 0625  BP: (!) 159/60  (!) 168/54 (!) 168/54  Pulse: 80  81   Resp: 20  19   Temp: 98 F (36.7 C)  97.6 F (36.4 C)   TempSrc: Oral     SpO2: 96%  100%   Weight:  99 kg (218 lb 4.1 oz)    Height:        Intake/Output Summary (Last 24 hours) at 10/28/2017 1029 Last data filed at 10/28/2017 1012 Gross per 24 hour  Intake 220 ml  Output 3000 ml  Net -2780 ml   Filed Weights   10/27/17 1610 10/27/17 1955 10/28/17 0500  Weight: 101 kg (222 lb 10.6 oz) 98.4 kg (216 lb 14.9 oz) 99 kg (218 lb 4.1 oz)    Exam: General exam: In no acute distress. Respiratory system: Good air movement and clear to auscultation. Cardiovascular system: S1 & S2 heard, RRR.  Gastrointestinal system: Abdomen is nondistended, soft and nontender.  Central nervous system: Alert and oriented. No focal neurological deficits. Extremities: No pedal edema. Skin: No rashes, lesions or ulcers   Data Reviewed:    Labs: Basic Metabolic Panel: Recent Labs  Lab 10/25/17 1029 10/26/17 1200 10/26/17 2002 10/27/17 0322 10/28/17 0436  NA 134* 134* 136 137 136  K 3.8 3.5 3.4* 3.3* 3.9  CL 94* 96* 98* 98* 99*  CO2 _0 GLUCOSE 158* 143* 219* 117*  94  BUN 58* 25* _1 CREATININE 2.88* 2.10* 1.48* 1.90* 2.29*  CALCIUM 8.4* 8.2* 8.1* 8.0* 8.0*  PHOS 2.8 2.8 1.7* 2.2* 2.5   GFR Estimated Creatinine Clearance: 31.3 mL/min (A) (by C-G formula based on SCr of 2.29 mg/dL (H)). Liver Function Tests: Recent Labs  Lab 10/25/17 1029 10/26/17 1200 10/26/17 2002 10/27/17 0322 10/28/17 0436  ALBUMIN 2.5* 2.3* 2.3* 2.3* 2.4*   No results for input(s): LIPASE, AMYLASE in the last 168 hours. No results for input(s): AMMONIA in the last 168 hours. Coagulation profile No results for input(s): INR, PROTIME in the last 168 hours.  CBC: Recent Labs  Lab 10/22/17 0415 10/24/17 0419 10/26/17 1200 10/26/17 2002 10/27/17 1730  WBC 18.8* 16.2* 21.5* 19.6* 18.3*  NEUTROABS  --   --  18.4*  --   --   HGB 8.6* 9.3* 9.1* 9.3* 8.6*  HCT 27.1* 29.3* 29.7* 30.1* 28.6*  MCV 87.4 91.8 91.1 90.9 91.1  PLT 427* 452* 462* 416* 445*   Cardiac Enzymes: Recent Labs  Lab 10/25/17 1029 10/25/17 1601 10/25/17 2157 10/27/17 0322 10/28/17 0436  CKTOTAL  --   --   --  12* 16*  TROPONINI 0.15* 0.15* 0.15*  --   --    BNP (last 3 results) No results for input(s): PROBNP in the last 8760 hours.  CBG: No results for input(s): GLUCAP in the last 168 hours. D-Dimer: No results for input(s): DDIMER in the last 72 hours. Hgb A1c: No results for input(s): HGBA1C in the last 72 hours. Lipid Profile: No results for input(s): CHOL, HDL, LDLCALC, TRIG, CHOLHDL, LDLDIRECT in the last 72 hours. Thyroid function studies: No results for input(s): TSH, T4TOTAL, T3FREE, THYROIDAB in the last 72 hours.  Invalid input(s): FREET3 Anemia work up: No results for input(s): VITAMINB12, FOLATE, FERRITIN, TIBC, IRON, RETICCTPCT in the last 72 hours. Sepsis Labs: Recent Labs  Lab 10/24/17 0419 10/26/17 1200 10/26/17 2002 10/27/17 1730  PROCALCITON  --   --  1.25  --   WBC 16.2* 21.5* 19.6* 18.3*   Microbiology Recent Results (from the past 240 hour(s))   Culture, blood (routine x 2)     Status: None   Collection Time: 10/19/17  2:56 PM  Result Value Ref Range Status   Specimen Description BLOOD LEFT ANTECUBITAL  Final   Special Requests   Final    BOTTLES DRAWN AEROBIC AND ANAEROBIC Blood Culture results may not be optimal due to an inadequate volume of blood received in culture bottles   Culture NO GROWTH 5 DAYS  Final   Report Status 10/24/2017 FINAL  Final  Culture, blood (routine x 2)     Status: None   Collection Time: 10/19/17  3:00 PM  Result Value Ref Range Status   Specimen Description BLOOD LEFT HAND  Final   Special Requests   Final    BOTTLES DRAWN AEROBIC AND ANAEROBIC Blood Culture adequate volume   Culture NO GROWTH 5 DAYS  Final   Report Status 10/24/2017 FINAL  Final     Medications:   . aspirin EC  81 mg Oral Daily  . calcium acetate  667 mg Oral TID WC  . carvedilol  12.5 mg Oral BID WC  . darbepoetin (ARANESP) injection - DIALYSIS  60 mcg Intravenous Q Thu-HD  . gabapentin  100 mg Oral Daily  . heparin injection (subcutaneous)  5,000 Units Subcutaneous Q12H  . hydrALAZINE  50 mg Oral Q8H  . isosorbide mononitrate  120 mg Oral Daily  . polyethylene glycol  17 g Oral Daily   Continuous Infusions: . cefTRIAXone (ROCEPHIN)  IV Stopped (10/26/17 1924)  . DAPTOmycin (CUBICIN)  IV        LOS: 18 days   [facility] Pager [phone]  *Please refer to [email or web address], password [email or web address] to get updated schedule on who will round on this patient, as hospitalists switch teams weekly. If 7PM-7AM, please contact night-coverage at [email or web address], password [email or web address] for any overnight needs.  10/28/2017, 10:29 AM

## 2017-10-28 NOTE — Progress Notes (Addendum)
Progress Note  Patient Name: Kristen Berry Date of Encounter: 10/28/2017  Primary Cardiologist: Dr. Tamala Julian  Subjective   No chest pain or dyspnea this am.   Inpatient Medications    Scheduled Meds: . aspirin EC  81 mg Oral Daily  . calcium acetate  667 mg Oral TID WC  . carvedilol  12.5 mg Oral BID WC  . darbepoetin (ARANESP) injection - DIALYSIS  60 mcg Intravenous Q Thu-HD  . gabapentin  100 mg Oral Daily  . heparin injection (subcutaneous)  5,000 Units Subcutaneous Q12H  . hydrALAZINE  100 mg Oral Q8H  . isosorbide mononitrate  120 mg Oral Daily  . polyethylene glycol  17 g Oral Daily   Continuous Infusions: . cefTRIAXone (ROCEPHIN)  IV Stopped (10/26/17 1924)  . DAPTOmycin (CUBICIN)  IV     PRN Meds: acetaminophen **OR** acetaminophen, camphor-menthol **AND** hydrOXYzine, docusate sodium, feeding supplement (NEPRO CARB STEADY), hydrALAZINE, ipratropium-albuterol, nitroGLYCERIN, ondansetron **OR** ondansetron (ZOFRAN) IV, oxyCODONE, sodium chloride flush, sorbitol, zolpidem   Vital Signs    Vitals:   10/27/17 2148 10/28/17 0500 10/28/17 0558 10/28/17 0625  BP: (!) 159/60  (!) 168/54 (!) 168/54  Pulse: 80  81   Resp: 20  19   Temp: 98 F (36.7 C)  97.6 F (36.4 C)   TempSrc: Oral     SpO2: 96%  100%   Weight:  218 lb 4.1 oz (99 kg)    Height:        Intake/Output Summary (Last 24 hours) at 10/28/2017 1105 Last data filed at 10/28/2017 1012 Gross per 24 hour  Intake 220 ml  Output 3000 ml  Net -2780 ml   Filed Weights   10/27/17 1610 10/27/17 1955 10/28/17 0500  Weight: 222 lb 10.6 oz (101 kg) 216 lb 14.9 oz (98.4 kg) 218 lb 4.1 oz (99 kg)    Telemetry      ECG      Physical Exam   General: Well developed, well nourished, NAD  HEENT: OP clear, mucus membranes moist  SKIN: warm, dry. No rashes. Neuro: No focal deficits  Musculoskeletal: Muscle strength 5/5 all ext  Psychiatric: Mood and affect normal  Neck: No JVD, no carotid bruits, no  thyromegaly, no lymphadenopathy.  Lungs:Clear bilaterally, no wheezes, rhonci, crackles Cardiovascular: Regular rate and rhythm. No murmurs, gallops or rubs. Abdomen:Soft. Bowel sounds present. Non-tender.  Extremities: No lower extremity edema. Pulses are 2 + in the bilateral DP/PT.   Labs    Chemistry Recent Labs  Lab 10/26/17 2002 10/27/17 0322 10/28/17 0436  NA 136 137 136  K 3.4* 3.3* 3.9  CL 98* 98* 99*  CO2 26 26 25   GLUCOSE 219* 117* 94  BUN 13 18 19   CREATININE 1.48* 1.90* 2.29*  CALCIUM 8.1* 8.0* 8.0*  ALBUMIN 2.3* 2.3* 2.4*  GFRNONAA 37* 27* 22*  GFRAA 43* 32* 25*  ANIONGAP 12 13 12      Hematology Recent Labs  Lab 10/26/17 1200 10/26/17 2002 10/27/17 1730  WBC 21.5* 19.6* 18.3*  RBC 3.26* 3.31* 3.14*  HGB 9.1* 9.3* 8.6*  HCT 29.7* 30.1* 28.6*  MCV 91.1 90.9 91.1  MCH 27.9 28.1 27.4  MCHC 30.6 30.9 30.1  RDW 19.3* 19.2* 19.0*  PLT 462* 416* 445*    Cardiac Enzymes Recent Labs  Lab 10/25/17 1029 10/25/17 1601 10/25/17 2157  TROPONINI 0.15* 0.15* 0.15*   No results for input(s): TROPIPOC in the last 168 hours.   BNP No results for input(s): BNP, PROBNP in  the last 168 hours.   DDimer No results for input(s): DDIMER in the last 168 hours.   Radiology    Dg Chest 2 View  Result Date: 10/27/2017 CLINICAL DATA:  62 year old female with shortness of breath. Osteomyelitis. Acute renal injury recently started dialysis. EXAM: CHEST  2 VIEW COMPARISON:  10/25/2017 and earlier. FINDINGS: AP and lateral views of the chest. Stable right PICC line and right IJ approach dialysis catheter. Normal cardiac size and mediastinal contours. Diffuse chronic pulmonary interstitial markings are chronic to a degree (seen in 2016, but there is superimposed patchy more confluent biapical pulmonary opacity greater on the right. Chronic linear opacity at the left hilum is stable since 2016. Questionable small pleural effusions. Visualized tracheal air column is within  normal limits. No acute osseous abnormality identified. IMPRESSION: 1. Nonspecific acute on chronic right greater than left upper lobe pulmonary opacity. Consider multifocal infection superimposed on chronic lung disease. 2. Questionable small pleural effusions. Electronically Signed   By: Genevie Ann M.D.   On: 10/27/2017 11:06    Cardiac Studies   TTE: 10/17/17  Study Conclusions  - Left ventricle: Septal and apical akinesis. The cavity size was moderately dilated. Wall thickness was increased in a pattern of mild LVH. Systolic function was moderately reduced. The estimated ejection fraction was in the range of 35% to 40%. Doppler parameters are consistent with both elevated ventricular end-diastolic filling pressure and elevated left atrial filling pressure. - Mitral valve: Moderately calcified, moderately thickened annulus. Moderately thickened leaflets . The findings are consistent with mild stenosis. There was moderate regurgitation. Valve area by continuity equation (using LVOT flow): 1.5 cm^2. - Left atrium: The atrium was mildly dilated. - Atrial septum: No defect or patent foramen ovale was identified. - Pulmonary arteries: PA peak pressure: 55 mm Hg (S).  Patient Profile     62 y.o. female with PMH of systolic HF, HTN, HL, CKD IV, osteomyelitis and PVD who presented with worsening renal function. Developed acute respiratory distress and acute renal failure. Echo 10/17/17 with LVEF=35-40%.   Assessment & Plan    1. Acute on chronic systolic HF/Cardiomyopathy: She has had ongoing issues with volume overload. This has been managed with dialysis. She was found during this admission to have reduced LV systolic function. We have discussed a cardiac cath to exclude CAD. This has been delayed given her renal insufficiency. I have spoken to Dr. Florene Glen today. She will be committed to long term dialysis. We will plan cardiac cath on Monday of next week.    I will place  her on the cath schedule and will enter cath orders. Right groin access if possible given need for future HD access from arms.   Addendum: Notified by primary team that pt now has bilateral LE DVT. She has been started on anti-coagulation. Will review plan this weekend with rounding team but may be best to hold off on cardiac cath which would require interruption of anti-coagulation.    For questions or updates, please contact [place] Please consult [email or web address] for contact info under Cardiology/STEMI.      Signed, Lauree Chandler, MD  10/28/2017, 11:05 AM

## 2017-10-28 NOTE — Care Management Important Message (Signed)
Important Message  Patient Details  Name: Kristen Berry MRN: [id number] Date of Birth: 10-Jul-1956   Medicare Important Message Given:  Yes    Carles Collet, RN 10/28/2017, 10:53 AM

## 2017-10-28 NOTE — Progress Notes (Addendum)
*  Preliminary Results* Bilateral lower extremity venous duplex completed. Bilateral lower extremities are positive for deep vein thrombosis involving bilateral posterior tibial veins, right peroneal veins, and left distal femoral vein. There is no evidence of Baker's cyst bilaterally.  Preliminary venous results discussed with Dr. Venetia Constable.  ABI completed:  RIGHT    LEFT    PRESSURE WAVEFORM  PRESSURE WAVEFORM  BRACHIAL  Unable to obtain due to PICC line location BRACHIAL    DP 100 Monophasic DP 84 Monophasic  AT   AT    PT 51 Monophasic PT 48 Monophasic  PER   PER    GREAT TOE  NA GREAT TOE  NA    RIGHT LEFT  ABI 0.53 0.44   The right ABI is suggestive of moderate, borderline severe arterial insufficiency at rest. The left ABI is suggestive of severe arterial insufficiency at rest.  Bilateral lower extremity arterial duplex completed. Findings suggest 50-74% stenosis of the right proximal superficial femoral artery. Occlusion noted in the mid superficial femoral artery with collateralization and reconstitution at the right distal superficial femoral artery. Occlusion is also noted in the right posterior tibial artery and right peroneal artery.   Occlusion noted in the left posterior tibial artery.  10/28/2017 12:32 PM Kristen Berry, BS, RVT, RDCS, RDMS

## 2017-10-29 ENCOUNTER — Inpatient Hospital Stay (HOSPITAL_COMMUNITY): Payer: Medicare HMO

## 2017-10-29 DIAGNOSIS — I5021 Acute systolic (congestive) heart failure: Secondary | ICD-10-CM

## 2017-10-29 DIAGNOSIS — R079 Chest pain, unspecified: Secondary | ICD-10-CM

## 2017-10-29 LAB — RENAL FUNCTION PANEL
Albumin: 2.4 g/dL — ABNORMAL LOW (ref 3.5–5.0)
Albumin: 2.3 g/dL — ABNORMAL LOW (ref 3.5–5.0)
Anion gap: 12 (ref 5–15)
Anion gap: 14 (ref 5–15)
BUN: 29 mg/dL — ABNORMAL HIGH (ref 6–20)
BUN: 31 mg/dL — ABNORMAL HIGH (ref 6–20)
CO2: 26 mmol/L (ref 22–32)
CO2: 24 mmol/L (ref 22–32)
Calcium: 8.1 mg/dL — ABNORMAL LOW (ref 8.9–10.3)
Calcium: 7.9 mg/dL — ABNORMAL LOW (ref 8.9–10.3)
Chloride: 98 mmol/L — ABNORMAL LOW (ref 101–111)
Chloride: 97 mmol/L — ABNORMAL LOW (ref 101–111)
Creatinine, Ser: 3.03 mg/dL — ABNORMAL HIGH (ref 0.44–1.00)
Creatinine, Ser: 3.01 mg/dL — ABNORMAL HIGH (ref 0.44–1.00)
GFR calc Af Amer: 18 mL/min — ABNORMAL LOW (ref >60)
GFR calc Af Amer: 18 mL/min — ABNORMAL LOW (ref >60)
GFR calc non Af Amer: 15 mL/min — ABNORMAL LOW (ref >60)
GFR calc non Af Amer: 16 mL/min — ABNORMAL LOW (ref >60)
Glucose, Bld: 134 mg/dL — ABNORMAL HIGH (ref 65–99)
Glucose, Bld: 122 mg/dL — ABNORMAL HIGH (ref 65–99)
Phosphorus: 2.6 mg/dL (ref 2.5–4.6)
Phosphorus: 2.8 mg/dL (ref 2.5–4.6)
Potassium: 3.6 mmol/L (ref 3.5–5.1)
Potassium: 3.6 mmol/L (ref 3.5–5.1)
Sodium: 136 mmol/L (ref 135–145)
Sodium: 135 mmol/L (ref 135–145)

## 2017-10-29 LAB — CBC
HCT: 29 % — ABNORMAL LOW (ref 36.0–46.0)
Hemoglobin: 8.7 g/dL — ABNORMAL LOW (ref 12.0–15.0)
MCH: 27.6 pg (ref 26.0–34.0)
MCHC: 30 g/dL (ref 30.0–36.0)
MCV: 92.1 fL (ref 78.0–100.0)
Platelets: 408 10*3/uL — ABNORMAL HIGH (ref 150–400)
RBC: 3.15 MIL/uL — ABNORMAL LOW (ref 3.87–5.11)
RDW: 19.5 % — ABNORMAL HIGH (ref 11.5–15.5)
WBC: 14.8 10*3/uL — ABNORMAL HIGH (ref 4.0–10.5)

## 2017-10-29 LAB — TROPONIN I: Troponin I: 0.05 ng/mL (ref <0.03)

## 2017-10-29 LAB — HEPARIN LEVEL (UNFRACTIONATED)
Heparin Unfractionated: <0.1 IU/mL — ABNORMAL LOW (ref 0.30–0.70)
Heparin Unfractionated: <0.1 IU/mL — ABNORMAL LOW (ref 0.30–0.70)

## 2017-10-29 MED ORDER — SODIUM CHLORIDE 0.9 % IV SOLN
400.0000 mg | Freq: Once | INTRAVENOUS | Status: AC
  Administered 2017-10-29: 400 mg via INTRAVENOUS
  Filled 2017-10-29: qty 8

## 2017-10-29 MED ORDER — HEPARIN SODIUM (PORCINE) 1000 UNIT/ML DIALYSIS
1000.0000 [IU] | INTRAMUSCULAR | Status: DC | PRN
  Filled 2017-10-29: qty 1

## 2017-10-29 MED ORDER — LIDOCAINE HCL (PF) 1 % IJ SOLN: 5.0000 mL | INTRAMUSCULAR | Status: DC | PRN

## 2017-10-29 MED ORDER — PENTAFLUOROPROP-TETRAFLUOROETH EX AERO: 1.0000 "application " | INHALATION_SPRAY | CUTANEOUS | Status: DC | PRN

## 2017-10-29 MED ORDER — ALTEPLASE 2 MG IJ SOLR: 2.0000 mg | Freq: Once | INTRAMUSCULAR | Status: DC

## 2017-10-29 MED ORDER — SODIUM CHLORIDE 0.9 % IV SOLN: 100.0000 mL | INTRAVENOUS | Status: DC | PRN

## 2017-10-29 MED ORDER — ALTEPLASE 2 MG IJ SOLR
2.0000 mg | Freq: Once | INTRAMUSCULAR | Status: AC
  Administered 2017-10-29: 2 mg

## 2017-10-29 MED ORDER — ALTEPLASE 2 MG IJ SOLR
INTRAMUSCULAR | Status: AC
  Administered 2017-10-29: 2 mg
  Filled 2017-10-29: qty 4

## 2017-10-29 MED ORDER — LIDOCAINE-PRILOCAINE 2.5-2.5 % EX CREA: 1.0000 "application " | TOPICAL_CREAM | CUTANEOUS | Status: DC | PRN

## 2017-10-29 MED ORDER — ALTEPLASE 2 MG IJ SOLR
2.0000 mg | Freq: Once | INTRAMUSCULAR | Status: AC | PRN
Start: 2017-10-29 — End: 2017-10-29
  Administered 2017-10-29: 2 mg

## 2017-10-29 MED ORDER — SODIUM CHLORIDE 0.9 % IV SOLN: 5.0000 mg | Freq: Once | INTRAVENOUS | Status: DC

## 2017-10-29 NOTE — Progress Notes (Signed)
CRITICAL VALUE ALERT  Critical Value:  Troponin 0.05  Date & Time Notied:  10/29/17 1400

## 2017-10-29 NOTE — Progress Notes (Signed)
Patient told doctor she was experiencing chest pain. New orders were placed. IV team paged and notified of stat lab orders. EKG completed and nitro was administered. Patient states " she thinks dialysis will make her feel better and remove the feeling of pressure". Will continue to monitor patient status.

## 2017-10-29 NOTE — Progress Notes (Signed)
[facility] for Heparin Indication: DVT  Allergies  Allergen Reactions  . Crestor [Rosuvastatin Calcium] Other (See Comments)    Leg pain   Patient Measurements: Height: 5\' 8"  (172.7 cm) Weight: 221 lb 9 oz (100.5 kg) IBW/kg (Calculated) : 63.9 Heparin Dosing Weight: 87.7 kg  Assessment: 62 yo F presents on 1/14 with worsening renal function. Treating for left toe osteo with dapto and ceftriaxone. Transferred to [facility] for further management. Now found to have DVT on doppler. Hgb low but stable.   Goal of Therapy:  Heparin level 0.3-0.7 units/ml Monitor platelets by anticoagulation protocol: Yes   Plan:  Inc heparin drip to 1800 units/hr 1400 HL  Narda Bonds, PharmD, BCPS Clinical Pharmacist Phone: [phone]

## 2017-10-29 NOTE — Progress Notes (Addendum)
PROGRESS NOTE    Kristen Berry  TFT:[id number] DOB: 09/13/1956 DOA: 10/10/2017 PCP: [place], Sarah T, MD   Brief Narrative: 62 y.o. 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  He started on hemodialysis.  Patient has dialysis catheter.  Nephrology consult appreciated.  #Acute systolic congestive heart failure/cardiomyopathy: Patient with fluid overload.  Plan for possible cardiac cath on Monday.  Cardiology consult appreciated.  Continue current cardiac medication.  On aspirin, Coreg, hydralazine, Imdur  #Anemia of chronic kidney disease: Continue Aranesp and IV iron during the day.  #Chest pain/shortness of breath likely due to fluid overload and pulmonary edema: Patient was complaining of chest pain.  Stat EKG with no ischemic change.  Troponin lower than before.  Stat chest x-ray with fluid overload.  Nitro as needed for chest pain.  Cardiology evaluation ongoing.  Plan for dialysis today.  #Left second toe osteomyelitis/peripheral  vascular disease: Orthopedics was consulted, the plan is to have Dr. Sharol Given evaluation done on 2/4.  -Evaluated by vascular surgeon, has abnormal ABI, likely needs aortogram and lower extremity angiogram for possible revascularization.  For now continue broad-spectrum antibiotics, ceftriaxone and daptomycin.  Follow-up consultants evaluation  #Right lower extremity DVT: Started on IV heparin.  No sign of bleeding.  May need long-term oral anticoagulation  after all procedure and investigation done.  Discussed with the patient.  #Essential hypertension: Monitor blood pressure.  Continue current medication.  Dialysis per nephrology.  #Physical deconditioning: PT OT evaluation.  DVT prophylaxis: heparin Code Status:full code Family Communication: heart healthy Disposition Plan: Likely discharge home versus rehab in 5-6 days.    Consultants:   Cardiology  Vascular surgery  Orthopedics  Nephrology  Procedures: Multiple imaging studies Antimicrobials: Rocephin and daptomycin  Subjective: Seen and examined at bedside.  Reported shortness of breath and chest pressure.  No coughing, headache, dizziness, nausea vomiting.  Objective: Vitals:   10/29/17 0500 10/29/17 0553 10/29/17 0838 10/29/17 1147  BP: (!) 163/54 (!) 163/54 (!) 153/56 (!) 150/51  Pulse: 79  74 73  Resp: 19     Temp: 98 F (36.7 C)   98.6 F (37 C)  TempSrc: Oral   Oral  SpO2: 97%  97% 97%  Weight: 100.5 kg (221 lb 9 oz)     Height:        Intake/Output Summary (Last 24 hours) at 10/29/2017 1343 Last data filed at 10/28/2017 1805 Gross per 24 hour  Intake 256.35 ml  Output -  Net 256.35 ml   Filed Weights   10/27/17 1955 10/28/17 0500 10/29/17 0500  Weight: 98.4 kg (216 lb 14.9 oz) 99 kg (218 lb 4.1 oz) 100.5 kg (221 lb 9 oz)  Examination:  General exam: Appears calm and comfortable  Respiratory system: Bibasilar rhonchi and decreased breath sound, respiratory for normal.  No wheezing Cardiovascular  system: S1 & S2 heard, RRR.  No pedal edema. Gastrointestinal system: Abdomen is nondistended, soft and nontender. Normal bowel sounds heard. Central nervous system: Alert and oriented. No focal neurological deficits. Extremities: Symmetric 5 x 5 power. Psychiatry: Judgement and insight appear normal. Mood & affect appropriate.     Data Reviewed: I have personally reviewed following labs and imaging studies  CBC: Recent Labs  Lab 10/24/17 0419 10/26/17 1200 10/26/17 2002 10/27/17 1730 10/29/17 0414  WBC 16.2* 21.5* 19.6* 18.3* 14.8*  NEUTROABS  --  18.4*  --   --   --   HGB 9.3* 9.1* 9.3* 8.6* 8.7*  HCT 29.3* 29.7* 30.1* 28.6* 29.0*  MCV 91.8 91.1 90.9 91.1 92.1  PLT 452* 462* 416* 445* 323*   Basic Metabolic Panel: Recent Labs  Lab 10/26/17 1200 10/26/17 2002 10/27/17 0322 10/28/17 0436 10/29/17 0414  NA 134* 136 137 136 135  136  K 3.5 3.4* 3.3* 3.9 3.6  3.6  CL 96* 98* 98* 99* 97*  98*  CO2 25 26 26 25 24  26   GLUCOSE 143* 219* 117* 94 122*  134*  BUN 25* 13 18 19  31*  29*  CREATININE 2.10* 1.48* 1.90* 2.29* 3.01*  3.03*  CALCIUM 8.2* 8.1* 8.0* 8.0* 7.9*  8.1*  PHOS 2.8 1.7* 2.2* 2.5 2.8  2.6   GFR: Estimated Creatinine Clearance: 23.9 mL/min (A) (by C-G formula based on SCr of 3.03 mg/dL (H)). Liver Function Tests: Recent Labs  Lab 10/26/17 1200 10/26/17 2002 10/27/17 0322 10/28/17 0436 10/29/17 0414  ALBUMIN 2.3* 2.3* 2.3* 2.4* 2.3*  2.4*   No results for input(s): LIPASE, AMYLASE in the last 168 hours. No results for input(s): AMMONIA in the last 168 hours. Coagulation Profile: No results for input(s): INR, PROTIME in the last 168 hours. Cardiac Enzymes: Recent Labs  Lab 10/25/17 1029 10/25/17 1601 10/25/17 2157 10/27/17 0322 10/28/17 0436  CKTOTAL  --   --   --  12* 16*  TROPONINI 0.15* 0.15* 0.15*  --   --    BNP (last 3 results) No results for input(s): PROBNP in the last 8760 hours. HbA1C: No results for input(s): HGBA1C in  the last 72 hours. CBG: No results for input(s): GLUCAP in the last 168 hours. Lipid Profile: No results for input(s): CHOL, HDL, LDLCALC, TRIG, CHOLHDL, LDLDIRECT in the last 72 hours. Thyroid Function Tests: No results for input(s): TSH, T4TOTAL, FREET4, T3FREE, THYROIDAB in the last 72 hours. Anemia Panel: No results for input(s): VITAMINB12, FOLATE, FERRITIN, TIBC, IRON, RETICCTPCT in the last 72 hours. Sepsis Labs: Recent Labs  Lab 10/26/17 2002  PROCALCITON 1.25    Recent Results (from the past 240 hour(s))  Culture, blood (routine x 2)     Status: None   Collection Time: 10/19/17  2:56 PM  Result Value Ref Range Status   Specimen Description BLOOD LEFT ANTECUBITAL  Final   Special Requests   Final    BOTTLES DRAWN AEROBIC AND ANAEROBIC Blood Culture results may not be optimal due to an inadequate volume of blood received in culture bottles   Culture NO GROWTH 5 DAYS  Final   Report Status 10/24/2017 FINAL  Final  Culture, blood (routine x 2)     Status: None   Collection Time: 10/19/17  3:00 PM  Result Value Ref Range Status   Specimen  Description BLOOD LEFT HAND  Final   Special Requests   Final    BOTTLES DRAWN AEROBIC AND ANAEROBIC Blood Culture adequate volume   Culture NO GROWTH 5 DAYS  Final   Report Status 10/24/2017 FINAL  Final         Radiology Studies: Dg Chest Port 1 View  Result Date: 10/29/2017 CLINICAL DATA:  Shortness of breath and chest pressure since last night EXAM: PORTABLE CHEST 1 VIEW COMPARISON:  10/27/2017, 12/13/2014 FINDINGS: Diffuse bilateral interstitial and alveolar airspace disease. More focal airspace disease in the upper lobes bilaterally, right greater than left. No pleural effusion or pneumothorax. Stable cardiomediastinal silhouette. No acute osseous abnormality. Large bore right-sided central venous catheter with the tip projecting over the right atrium. Right-sided PICC line with the tip projecting over the SVC. No acute osseous  abnormality. IMPRESSION: 1. Bilateral chronic interstitial lung disease. Superimposed bilateral interstitial and alveolar airspace opacities with more prominent airspace disease in the upper lobes, right greater than left. Differential considerations include interstitial edema versus multifocal pneumonia superimposed upon chronic lung disease. Electronically Signed   By: Kathreen Devoid   On: 10/29/2017 11:09        Scheduled Meds: . aspirin EC  81 mg Oral Daily  . calcium acetate  667 mg Oral TID WC  . carvedilol  12.5 mg Oral BID WC  . darbepoetin (ARANESP) injection - DIALYSIS  60 mcg Intravenous Q Thu-HD  . gabapentin  100 mg Oral Daily  . hydrALAZINE  100 mg Oral Q8H  . isosorbide mononitrate  120 mg Oral Daily  . polyethylene glycol  17 g Oral Daily   Continuous Infusions: . cefTRIAXone (ROCEPHIN)  IV Stopped (10/28/17 1840)  . DAPTOmycin (CUBICIN)  IV Stopped (10/28/17 1757)  . heparin 1,800 Units/hr (10/29/17 0555)     LOS: 19 days    Dron Tanna Furry, MD [facility] Pager [phone]  If 7PM-7AM, please contact night-coverage [email or web address] Password [facility] 10/29/2017, 1:43 PM

## 2017-10-29 NOTE — Progress Notes (Signed)
Progress Note  Patient Name: Kristen Berry Date of Encounter: 10/29/2017  Primary Cardiologist:   No primary care provider on file.   Subjective   She had chest discomfort earlier today.  No EKG done but enzymes not elevated.  She is currently pain free in dialysis.    Inpatient Medications    Scheduled Meds: . aspirin EC  81 mg Oral Daily  . calcium acetate  667 mg Oral TID WC  . carvedilol  12.5 mg Oral BID WC  . darbepoetin (ARANESP) injection - DIALYSIS  60 mcg Intravenous Q Thu-HD  . gabapentin  100 mg Oral Daily  . hydrALAZINE  100 mg Oral Q8H  . isosorbide mononitrate  120 mg Oral Daily  . polyethylene glycol  17 g Oral Daily   Continuous Infusions: . sodium chloride    . sodium chloride    . cefTRIAXone (ROCEPHIN)  IV Stopped (10/28/17 1840)  . DAPTOmycin (CUBICIN)  IV Stopped (10/28/17 1757)  . heparin 1,800 Units/hr (10/29/17 0555)   PRN Meds: sodium chloride, sodium chloride, acetaminophen **OR** acetaminophen, alteplase, camphor-menthol **AND** hydrOXYzine, docusate sodium, feeding supplement (NEPRO CARB STEADY), heparin, hydrALAZINE, ipratropium-albuterol, lidocaine (PF), lidocaine-prilocaine, nitroGLYCERIN, ondansetron **OR** ondansetron (ZOFRAN) IV, oxyCODONE, pentafluoroprop-tetrafluoroeth, sodium chloride flush, sorbitol, zolpidem   Vital Signs    Vitals:   10/29/17 0500 10/29/17 0553 10/29/17 0838 10/29/17 1147  BP: (!) 163/54 (!) 163/54 (!) 153/56 (!) 150/51  Pulse: 79  74 73  Resp: 19     Temp: 98 F (36.7 C)   98.6 F (37 C)  TempSrc: Oral   Oral  SpO2: 97%  97% 97%  Weight: 221 lb 9 oz (100.5 kg)     Height:        Intake/Output Summary (Last 24 hours) at 10/29/2017 1448 Last data filed at 10/28/2017 1805 Gross per 24 hour  Intake 256.35 ml  Output -  Net 256.35 ml   Filed Weights   10/27/17 1955 10/28/17 0500 10/29/17 0500  Weight: 216 lb 14.9 oz (98.4 kg) 218 lb 4.1 oz (99 kg) 221 lb 9 oz (100.5 kg)    Telemetry    NA (in dialysis)  - Personally Reviewed  ECG    NA - Personally Reviewed  Physical Exam   GEN: No acute distress.   Neck: No  JVD Cardiac: RRR, no murmurs, rubs, or gallops.  Respiratory: Clear  to auscultation bilaterally. GI: Soft, nontender, non-distended  MS: No  edema; No deformity. Neuro:  Nonfocal  Psych: Normal affect   Labs    Chemistry Recent Labs  Lab 10/27/17 0322 10/28/17 0436 10/29/17 0414  NA 137 136 135  136  K 3.3* 3.9 3.6  3.6  CL 98* 99* 97*  98*  CO2 26 25 24  26   GLUCOSE 117* 94 122*  134*  BUN 18 19 31*  29*  CREATININE 1.90* 2.29* 3.01*  3.03*  CALCIUM 8.0* 8.0* 7.9*  8.1*  ALBUMIN 2.3* 2.4* 2.3*  2.4*  GFRNONAA 27* 22* 16*  15*  GFRAA 32* 25* 18*  18*  ANIONGAP 13 12 14  12      Hematology Recent Labs  Lab 10/26/17 2002 10/27/17 1730 10/29/17 0414  WBC 19.6* 18.3* 14.8*  RBC 3.31* 3.14* 3.15*  HGB 9.3* 8.6* 8.7*  HCT 30.1* 28.6* 29.0*  MCV 90.9 91.1 92.1  MCH 28.1 27.4 27.6  MCHC 30.9 30.1 30.0  RDW 19.2* 19.0* 19.5*  PLT 416* 445* 408*    Cardiac Enzymes Recent Labs  Lab 10/25/17 1029 10/25/17 1601 10/25/17 2157 10/29/17 1100  TROPONINI 0.15* 0.15* 0.15* 0.05*   No results for input(s): TROPIPOC in the last 168 hours.   BNPNo results for input(s): BNP, PROBNP in the last 168 hours.   DDimer No results for input(s): DDIMER in the last 168 hours.   Radiology    Dg Chest Port 1 View  Result Date: 10/29/2017 CLINICAL DATA:  Shortness of breath and chest pressure since last night EXAM: PORTABLE CHEST 1 VIEW COMPARISON:  10/27/2017, 12/13/2014 FINDINGS: Diffuse bilateral interstitial and alveolar airspace disease. More focal airspace disease in the upper lobes bilaterally, right greater than left. No pleural effusion or pneumothorax. Stable cardiomediastinal silhouette. No acute osseous abnormality. Large bore right-sided central venous catheter with the tip projecting over the right atrium. Right-sided PICC line with the tip  projecting over the SVC. No acute osseous abnormality. IMPRESSION: 1. Bilateral chronic interstitial lung disease. Superimposed bilateral interstitial and alveolar airspace opacities with more prominent airspace disease in the upper lobes, right greater than left. Differential considerations include interstitial edema versus multifocal pneumonia superimposed upon chronic lung disease. Electronically Signed   By: Kathreen Devoid   On: 10/29/2017 11:09    Cardiac Studies   TTE: 10/17/17  Study Conclusions  - Left ventricle: Septal and apical akinesis. The cavity size was moderately dilated. Wall thickness was increased in a pattern of mild LVH. Systolic function was moderately reduced. The estimated ejection fraction was in the range of 35% to 40%. Doppler parameters are consistent with both elevated ventricular end-diastolic filling pressure and elevated left atrial filling pressure. - Mitral valve: Moderately calcified, moderately thickened annulus. Moderately thickened leaflets . The findings are consistent with mild stenosis. There was moderate regurgitation. Valve area by continuity equation (using LVOT flow): 1.5 cm^2. - Left atrium: The atrium was mildly dilated. - Atrial septum: No defect or patent foramen ovale was identified. - Pulmonary arteries: PA peak pressure: 55 mm Hg (S).    Patient Profile     62 y.o. female with PMH of systolic HF, HTN, HL, CKD IV, osteomyelitis and PVD who presented with worsening renal function.Developed acute respiratory distress and acute renal failure.Echo 10/17/17 with LVEF=35-40%.   Assessment & Plan    ACUTE ON CHRONIC SYSTOLIC HF:  EF is reduced as above.  The initial plan was for cardiac cath.   Given multiple comorbid issues we will likely defer invasive coronary evaluation for now.  We will reassess in the AM.  Continue current management.    AKI:  Now on dialysis probably permanently.    CHEST PAIN:  Troponin is lower  than before and not diagnostic of an acute coronary event.    For questions or updates, please contact [place] Please consult [email or web address] for contact info under Cardiology/STEMI.   Signed, Minus Breeding, MD  10/29/2017, 2:48 PM

## 2017-10-29 NOTE — Progress Notes (Signed)
MD text paged above troponin result

## 2017-10-29 NOTE — Progress Notes (Signed)
Assessment/ Plan:    1. Acute kidney injury on chronic kidney disease stage(cr 2.4)IV:Appears ESRD -  I believe she is best served by dialysis(prob long term) and have informed her this.  I think we should proceed with cardiovascular evaluation as appropriate.  Will need AV access before discharge. HD again today.  2. Acute diastolic OPF:[id number] with dialysis  3. Osteomyelitis second left toe/cellulitis: ongoing intravenous antibiotic     Subjective: Interval History: No appetite and SOB  Objective: Vital signs in last 24 hours: Temp:  [98 F (36.7 C)] 98 F (36.7 C) (02/02 0500) Pulse Rate:  [71-79] 74 (02/02 0838) Resp:  [19] 19 (02/02 0500) BP: (123-163)/(40-63) 153/56 (02/02 0838) SpO2:  [97 %-99 %] 97 % (02/02 0838) Weight:  [100.5 kg (221 lb 9 oz)] 100.5 kg (221 lb 9 oz) (02/02 0500) Weight change: -0.5 kg (-1.6 oz)  Intake/Output from previous day: 02/01 0701 - 02/02 0700 In: 476.4 [P.O.:220; I.V.:42.8; IV Piggyback:213.6] Out: -  Intake/Output this shift: No intake/output data recorded.  General appearance: alert and cooperative Resp: diminished breath sounds bibasilar Cardio: regular rate and rhythm, S1, S2 normal, no murmur, click, rub or gallop Extremities: edema 1+  Lab Results: Recent Labs    10/27/17 1730 10/29/17 0414  WBC 18.3* 14.8*  HGB 8.6* 8.7*  HCT 28.6* 29.0*  PLT 445* 408*   BMET:  Recent Labs    10/28/17 0436 10/29/17 0414  NA 136 136  K 3.9 3.6  CL 99* 98*  CO2 25 26  GLUCOSE 94 134*  BUN 19 29*  CREATININE 2.29* 3.03*  CALCIUM 8.0* 8.1*   No results for input(s): PTH in the last 72 hours. Iron Studies: No results for input(s): IRON, TIBC, TRANSFERRIN, FERRITIN in the last 72 hours. Studies/Results: Dg Chest 2 View  Result Date: 10/27/2017 CLINICAL DATA:  62 year old female with shortness of breath. Osteomyelitis. Acute renal injury recently started dialysis. EXAM: CHEST  2 VIEW COMPARISON:  10/25/2017 and earlier.  FINDINGS: AP and lateral views of the chest. Stable right PICC line and right IJ approach dialysis catheter. Normal cardiac size and mediastinal contours. Diffuse chronic pulmonary interstitial markings are chronic to a degree (seen in 2016, but there is superimposed patchy more confluent biapical pulmonary opacity greater on the right. Chronic linear opacity at the left hilum is stable since 2016. Questionable small pleural effusions. Visualized tracheal air column is within normal limits. No acute osseous abnormality identified. IMPRESSION: 1. Nonspecific acute on chronic right greater than left upper lobe pulmonary opacity. Consider multifocal infection superimposed on chronic lung disease. 2. Questionable small pleural effusions. Electronically Signed   By: Genevie Ann M.D.   On: 10/27/2017 11:06   Scheduled: . aspirin EC  81 mg Oral Daily  . calcium acetate  667 mg Oral TID WC  . carvedilol  12.5 mg Oral BID WC  . darbepoetin (ARANESP) injection - DIALYSIS  60 mcg Intravenous Q Thu-HD  . gabapentin  100 mg Oral Daily  . hydrALAZINE  100 mg Oral Q8H  . isosorbide mononitrate  120 mg Oral Daily  . polyethylene glycol  17 g Oral Daily     LOS: 19 days   Estanislado Emms 10/29/2017,9:57 AM

## 2017-10-29 NOTE — Progress Notes (Signed)
[place] [facility] for Heparin Indication: DVT  Allergies  Allergen Reactions  . Crestor [Rosuvastatin Calcium] Other (See Comments)    Leg pain   Patient Measurements: Height: 5\' 8"  (172.7 cm) Weight: 221 lb 9 oz (100.5 kg) IBW/kg (Calculated) : 63.9 Heparin Dosing Weight: 87.7 kg  Assessment: 62 yo F presents on 1/14 with worsening renal function. Treating for left toe osteo with dapto and ceftriaxone. Transferred to [facility] for further management. Now found to have DVT on doppler. Hgb low but stable. Last heparin level still undetectable.  Goal of Therapy:  Heparin level 0.3-0.7 units/ml Monitor platelets by anticoagulation protocol: Yes   Plan:  Increase heparin drip to 2,100 units/hr Monitor daily heparin level, CBC, s/s of bleed  Elenor Quinones, PharmD, [facility] Clinical Pharmacist Pager [phone] 10/29/2017 1:57 PM

## 2017-10-29 NOTE — Procedures (Addendum)
She has been getting dialysis aggressively to optimize volume and has had less dyspnea. There have been catheter nalfunctions so will plan TPa post tx today. Estanislado Emms, MD

## 2017-10-29 NOTE — Progress Notes (Signed)
PHARMACY NOTE:  ANTIMICROBIAL RENAL DOSAGE ADJUSTMENT  Current antimicrobial regimen includes a mismatch between antimicrobial dosage and estimated renal function.  As per policy approved by the Pharmacy & Therapeutics and Medical Executive Committees, the antimicrobial dosage will be adjusted accordingly.  Current antimicrobial dosage:  Daptomycin 680 mg every 48 hours   Indication: Osteomyelitis   Renal Function:  Estimated Creatinine Clearance: 23.7 mL/min (A) (by C-G formula based on SCr of 3.03 mg/dL (H)). [x]      On intermittent HD, scheduled: []      On CRRT    Antimicrobial dosage has been changed to:  Adding a supplemental dose of Daptomycin 4 mg/kg tonight due to extra dialysis session today  Additional comments:   Thank you for allowing pharmacy to be a part of this patient's care.  Jimmy Footman, PharmD, BCPS PGY2 Infectious Diseases Pharmacy Resident Pager: [phone]  10/29/2017 5:34 PM

## 2017-10-30 LAB — CBC
HCT: 27.3 % — ABNORMAL LOW (ref 36.0–46.0)
Hemoglobin: 8.4 g/dL — ABNORMAL LOW (ref 12.0–15.0)
MCH: 28.1 pg (ref 26.0–34.0)
MCHC: 30.8 g/dL (ref 30.0–36.0)
MCV: 91.3 fL (ref 78.0–100.0)
Platelets: 370 10*3/uL (ref 150–400)
RBC: 2.99 MIL/uL — ABNORMAL LOW (ref 3.87–5.11)
RDW: 19.3 % — ABNORMAL HIGH (ref 11.5–15.5)
WBC: 14.2 10*3/uL — ABNORMAL HIGH (ref 4.0–10.5)

## 2017-10-30 LAB — HEPARIN LEVEL (UNFRACTIONATED)
Heparin Unfractionated: 1.42 IU/mL — ABNORMAL HIGH (ref 0.30–0.70)
Heparin Unfractionated: 0.92 IU/mL — ABNORMAL HIGH (ref 0.30–0.70)
Heparin Unfractionated: 0.18 IU/mL — ABNORMAL LOW (ref 0.30–0.70)
Heparin Unfractionated: <0.1 IU/mL — ABNORMAL LOW (ref 0.30–0.70)

## 2017-10-30 MED ORDER — SODIUM CHLORIDE 0.9 % IV SOLN
680.0000 mg | INTRAVENOUS | Status: DC
  Administered 2017-10-31: 680 mg via INTRAVENOUS
  Filled 2017-10-30: qty 13.6

## 2017-10-30 NOTE — Progress Notes (Addendum)
PROGRESS NOTE    Kristen Berry  AOZ:[id number] DOB: 22-Jan-1956 DOA: 10/10/2017 PCP: [place], Sarah T, MD   Brief Narrative: 62 y.o. 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  Transferred to [facility] due to worsening renal failure, temporary dialysis catheter was placed on 10/19/2016, the patient started dialysis on 10/20/2016, orthopedic and vascular surgery was consulted due to persistent leukocytosis, cardiology was consulted and discussed with vascular he will performed coronary angiogram and selective left lower and right lower extremity angiogram for possible revascularization, then be evaluated by orthopedic surgery for possible left second toe amputation  Assessment & Plan:   #Acute kidney injury on chronic kidney disease stage IV now likely progressed to ESRD: Patient with fluid overload, not improved with diuretics.  Currently on hemodialysis.  Patient has dialysis catheter.  Nephrology consult appreciated. tpa post dialysis on 2/2.  #Acute systolic congestive heart failure/cardiomyopathy: Patient with fluid overload.  Plan for possible cardiac cath per Cardiology.  Continue current cardiac medication.  On aspirin, Coreg, hydralazine, Imdur -Patient likely needs vascular surgery and possibly orthopedic procedure for her lower extremity.  She may need cardiac intervention before any other procedure.  In this case we may be able to hold heparin before the cardiac cath, defer this to Cardiology team.   #Anemia of chronic kidney disease: Continue Aranesp and IV iron during the day.  #Chest pain/shortness of breath likely due to fluid overload and pulmonary edema: Feeling good  today.  No chest pain or shortness of breath.  #Left second toe osteomyelitis/peripheral vascular disease: Orthopedics was consulted, the plan is to have Dr. Sharol Given evaluation done on 2/4.  -Evaluated by vascular surgeon, has abnormal ABI, likely needs aortogram and lower extremity angiogram for possible revascularization.  For now continue broad-spectrum antibiotics, ceftriaxone and daptomycin.  Follow-up consultants evaluation  #Right lower extremity DVT: Started on IV heparin.  No sign of bleeding.  May need long-term oral anticoagulation  after all procedure and investigation done.  Discussed with the patient.  #Essential hypertension: Monitor blood pressure.  Continue current medication.  Dialysis per nephrology.  #Physical deconditioning: PT OT evaluation.  DVT prophylaxis: heparin Code Status:full code Family Communication: heart healthy Disposition Plan: Likely discharge home versus rehab in 5-6 days.    Consultants:   Cardiology  Vascular surgery  Orthopedics  Nephrology  Procedures: Multiple imaging studies Antimicrobials: Rocephin and daptomycin  Subjective: Seen and examined at bedside.  No new event.  Denies headache, dizziness, nausea vomiting chest pain shortness of breath. Objective: Vitals:   10/29/17 1935 10/29/17 2228 10/30/17 0448 10/30/17 0913  BP: (!) 144/48 (!) 164/69 (!) 142/44 (!) 150/55  Pulse: 76 84 75 84  Resp: (!) 21 (!) 21 (!) 21   Temp: 97.9 F (36.6 C) 97.9 F (36.6 C) 98.6 F (37 C)   TempSrc: Oral Oral Oral   SpO2: 99% 97% 99% 98%  Weight:   99 kg (218 lb 4.1 oz)   Height:        Intake/Output Summary (Last 24 hours) at 10/30/2017 1256 Last data filed at 10/30/2017 0958 Gross per 24 hour  Intake 834.3 ml  Output 1130 ml  Net -295.7 ml   Filed Weights   10/29/17 1400 10/29/17 1830 10/30/17 0448  Weight: 99.2 kg (218 lb 11.1 oz) 99 kg (218 lb 4.1 oz) 99 kg (218 lb 4.1 oz)    Examination:  General exam: Not in  distress Respiratory system: Bibasilar decreased breath sound, no wheezing.  Respiratory effort normal. Cardiovascular system: Regular rate rhythm S1-S2 normal.  No pedal edema. Gastrointestinal system: Abdomen soft, nontender.  Bowel sounds positive. Central nervous system: Alert and oriented. No focal neurological deficits. Extremities: Symmetric 5 x 5 power. Psychiatry: Judgement and insight appear normal. Mood & affect appropriate.     Data Reviewed: I have personally reviewed following labs and imaging studies  CBC: Recent Labs  Lab 10/26/17 1200 10/26/17 2002 10/27/17 1730 10/29/17 0414 10/30/17 0209  WBC 21.5* 19.6* 18.3* 14.8* 14.2*  NEUTROABS 18.4*  --   --   --   --   HGB 9.1* 9.3* 8.6* 8.7* 8.4*  HCT 29.7* 30.1* 28.6* 29.0* 27.3*  MCV 91.1 90.9 91.1 92.1 91.3  PLT 462* 416* 445* 408* 161   Basic Metabolic Panel: Recent Labs  Lab 10/26/17 1200 10/26/17 2002 10/27/17 0322 10/28/17 0436 10/29/17 0414  NA 134* 136 137 136 135  136  K 3.5 3.4* 3.3* 3.9 3.6  3.6  CL 96* 98* 98* 99* 97*  98*  CO2 25 26 26 25 24  26   GLUCOSE 143* 219* 117* 94 122*  134*  BUN 25* 13 18 19  31*  29*  CREATININE 2.10* 1.48* 1.90* 2.29* 3.01*  3.03*  CALCIUM 8.2* 8.1* 8.0* 8.0* 7.9*  8.1*  PHOS 2.8 1.7* 2.2* 2.5 2.8  2.6   GFR: Estimated Creatinine Clearance: 23.7 mL/min (A) (by C-G formula based on SCr of 3.03 mg/dL (H)). Liver Function Tests: Recent Labs  Lab 10/26/17 1200 10/26/17 2002 10/27/17 0322 10/28/17 0436 10/29/17 0414  ALBUMIN 2.3* 2.3* 2.3* 2.4* 2.3*  2.4*   No results for input(s): LIPASE, AMYLASE in the last 168 hours. No results for input(s): AMMONIA in the last 168 hours. Coagulation Profile: No results for input(s): INR, PROTIME in the last 168 hours. Cardiac Enzymes: Recent Labs  Lab 10/25/17 1029 10/25/17 1601 10/25/17 2157 10/27/17 0322 10/28/17 0436 10/29/17 1100  CKTOTAL  --   --   --  12* 16*  --   TROPONINI 0.15* 0.15* 0.15*  --    --  0.05*   BNP (last 3 results) No results for input(s): PROBNP in the last 8760 hours. HbA1C: No results for input(s): HGBA1C in the last 72 hours. CBG: No results for input(s): GLUCAP in the last 168 hours. Lipid Profile: No results for input(s): CHOL, HDL, LDLCALC, TRIG, CHOLHDL, LDLDIRECT in the last 72 hours. Thyroid Function Tests: No results for input(s): TSH, T4TOTAL, FREET4, T3FREE, THYROIDAB in the last 72 hours. Anemia Panel: No results for input(s): VITAMINB12, FOLATE, FERRITIN, TIBC, IRON, RETICCTPCT in the last 72 hours. Sepsis Labs: Recent Labs  Lab 10/26/17 2002  PROCALCITON 1.25    No results found for this or any previous visit (from the past 240 hour(s)).       Radiology Studies: Dg Chest Port 1 View  Result Date: 10/29/2017 CLINICAL DATA:  Shortness of breath and chest pressure since last night EXAM: PORTABLE CHEST 1 VIEW COMPARISON:  10/27/2017, 12/13/2014 FINDINGS: Diffuse bilateral interstitial and alveolar airspace disease. More focal airspace disease in the upper lobes bilaterally, right greater than left. No pleural effusion or pneumothorax. Stable cardiomediastinal silhouette. No acute osseous abnormality. Large bore right-sided central venous catheter with the tip projecting over the right atrium. Right-sided  PICC line with the tip projecting over the SVC. No acute osseous abnormality. IMPRESSION: 1. Bilateral chronic interstitial lung disease. Superimposed bilateral interstitial and alveolar airspace opacities with more prominent airspace disease in the upper lobes, right greater than left. Differential considerations include interstitial edema versus multifocal pneumonia superimposed upon chronic lung disease. Electronically Signed   By: Kathreen Devoid   On: 10/29/2017 11:09        Scheduled Meds: . aspirin EC  81 mg Oral Daily  . calcium acetate  667 mg Oral TID WC  . carvedilol  12.5 mg Oral BID WC  . darbepoetin (ARANESP) injection - DIALYSIS   60 mcg Intravenous Q Thu-HD  . gabapentin  100 mg Oral Daily  . hydrALAZINE  100 mg Oral Q8H  . isosorbide mononitrate  120 mg Oral Daily  . polyethylene glycol  17 g Oral Daily   Continuous Infusions: . sodium chloride    . sodium chloride    . cefTRIAXone (ROCEPHIN)  IV Stopped (10/29/17 2231)  . [START ON 10/31/2017] DAPTOmycin (CUBICIN)  IV    . heparin 1,900 Units/hr (10/30/17 0631)     LOS: 20 days    Dron Tanna Furry, MD [facility] Pager [phone]  If 7PM-7AM, please contact night-coverage [email or web address] Password [facility] 10/30/2017, 12:56 PM

## 2017-10-30 NOTE — Progress Notes (Signed)
Progress Note  Patient Name: Kristen Berry Date of Encounter: 10/30/2017  Primary Cardiologist:   No primary care provider on file.   Subjective   She denies any chest pain.  Denies SOB at present  Inpatient Medications    Scheduled Meds: . aspirin EC  81 mg Oral Daily  . calcium acetate  667 mg Oral TID WC  . carvedilol  12.5 mg Oral BID WC  . darbepoetin (ARANESP) injection - DIALYSIS  60 mcg Intravenous Q Thu-HD  . gabapentin  100 mg Oral Daily  . hydrALAZINE  100 mg Oral Q8H  . isosorbide mononitrate  120 mg Oral Daily  . polyethylene glycol  17 g Oral Daily   Continuous Infusions: . sodium chloride    . sodium chloride    . cefTRIAXone (ROCEPHIN)  IV Stopped (10/29/17 2231)  . [START ON 10/31/2017] DAPTOmycin (CUBICIN)  IV    . heparin 1,900 Units/hr (10/30/17 0631)   PRN Meds: sodium chloride, sodium chloride, acetaminophen **OR** acetaminophen, camphor-menthol **AND** hydrOXYzine, docusate sodium, feeding supplement (NEPRO CARB STEADY), heparin, hydrALAZINE, ipratropium-albuterol, lidocaine (PF), lidocaine-prilocaine, nitroGLYCERIN, ondansetron **OR** ondansetron (ZOFRAN) IV, oxyCODONE, pentafluoroprop-tetrafluoroeth, sodium chloride flush, sorbitol, zolpidem   Vital Signs    Vitals:   10/29/17 1935 10/29/17 2228 10/30/17 0448 10/30/17 0913  BP: (!) 144/48 (!) 164/69 (!) 142/44 (!) 150/55  Pulse: 76 84 75 84  Resp: (!) 21 (!) 21 (!) 21   Temp: 97.9 F (36.6 C) 97.9 F (36.6 C) 98.6 F (37 C)   TempSrc: Oral Oral Oral   SpO2: 99% 97% 99% 98%  Weight:   218 lb 4.1 oz (99 kg)   Height:        Intake/Output Summary (Last 24 hours) at 10/30/2017 1244 Last data filed at 10/30/2017 0958 Gross per 24 hour  Intake 834.3 ml  Output 1130 ml  Net -295.7 ml   Filed Weights   10/29/17 1400 10/29/17 1830 10/30/17 0448  Weight: 218 lb 11.1 oz (99.2 kg) 218 lb 4.1 oz (99 kg) 218 lb 4.1 oz (99 kg)    Telemetry    NSR - Personally Reviewed  ECG    NA - Personally  Reviewed  Physical Exam   GEN: No  acute distress.   Neck: No  JVD Cardiac: RRR, no murmurs, rubs, or gallops.  Respiratory: Clear  to auscultation bilaterally. GI: Soft, nontender, non-distended, normal bowel sounds  MS:  No edema; No deformity. Neuro:   Nonfocal  Psych: Oriented and appropriate   Labs    Chemistry Recent Labs  Lab 10/27/17 0322 10/28/17 0436 10/29/17 0414  NA 137 136 135  136  K 3.3* 3.9 3.6  3.6  CL 98* 99* 97*  98*  CO2 26 25 24  26   GLUCOSE 117* 94 122*  134*  BUN 18 19 31*  29*  CREATININE 1.90* 2.29* 3.01*  3.03*  CALCIUM 8.0* 8.0* 7.9*  8.1*  ALBUMIN 2.3* 2.4* 2.3*  2.4*  GFRNONAA 27* 22* 16*  15*  GFRAA 32* 25* 18*  18*  ANIONGAP 13 12 14  12      Hematology Recent Labs  Lab 10/27/17 1730 10/29/17 0414 10/30/17 0209  WBC 18.3* 14.8* 14.2*  RBC 3.14* 3.15* 2.99*  HGB 8.6* 8.7* 8.4*  HCT 28.6* 29.0* 27.3*  MCV 91.1 92.1 91.3  MCH 27.4 27.6 28.1  MCHC 30.1 30.0 30.8  RDW 19.0* 19.5* 19.3*  PLT 445* 408* 370    Cardiac Enzymes Recent Labs  Lab 10/25/17  1029 10/25/17 1601 10/25/17 2157 10/29/17 1100  TROPONINI 0.15* 0.15* 0.15* 0.05*   No results for input(s): TROPIPOC in the last 168 hours.   BNPNo results for input(s): BNP, PROBNP in the last 168 hours.   DDimer No results for input(s): DDIMER in the last 168 hours.   Radiology    Dg Chest Port 1 View  Result Date: 10/29/2017 CLINICAL DATA:  Shortness of breath and chest pressure since last night EXAM: PORTABLE CHEST 1 VIEW COMPARISON:  10/27/2017, 12/13/2014 FINDINGS: Diffuse bilateral interstitial and alveolar airspace disease. More focal airspace disease in the upper lobes bilaterally, right greater than left. No pleural effusion or pneumothorax. Stable cardiomediastinal silhouette. No acute osseous abnormality. Large bore right-sided central venous catheter with the tip projecting over the right atrium. Right-sided PICC line with the tip projecting over the  SVC. No acute osseous abnormality. IMPRESSION: 1. Bilateral chronic interstitial lung disease. Superimposed bilateral interstitial and alveolar airspace opacities with more prominent airspace disease in the upper lobes, right greater than left. Differential considerations include interstitial edema versus multifocal pneumonia superimposed upon chronic lung disease. Electronically Signed   By: Kathreen Devoid   On: 10/29/2017 11:09    Cardiac Studies   TTE: 10/17/17  Study Conclusions  - Left ventricle: Septal and apical akinesis. The cavity size was moderately dilated. Wall thickness was increased in a pattern of mild LVH. Systolic function was moderately reduced. The estimated ejection fraction was in the range of 35% to 40%. Doppler parameters are consistent with both elevated ventricular end-diastolic filling pressure and elevated left atrial filling pressure. - Mitral valve: Moderately calcified, moderately thickened annulus. Moderately thickened leaflets . The findings are consistent with mild stenosis. There was moderate regurgitation. Valve area by continuity equation (using LVOT flow): 1.5 cm^2. - Left atrium: The atrium was mildly dilated. - Atrial septum: No defect or patent foramen ovale was identified. - Pulmonary arteries: PA peak pressure: 55 mm Hg (S).    Patient Profile     62 y.o. female with PMH of systolic HF, HTN, HL, CKD IV, osteomyelitis and PVD who presented with worsening renal function.Developed acute respiratory distress and acute renal failure.Echo 10/17/17 with LVEF=35-40%.   Assessment & Plan    ACUTE ON CHRONIC SYSTOLIC HF:  EF is reduced as above.  The initial plan was for cardiac cath.   However, she was not able to get dialysis completely yesterday by her report because of catheter problems.  I would think this needs to be resolved before considering elective cath.  For now I would suggest we continue with current medical management.   We could consider cath in the future after she has complete anticoagulation treatment for her DVT.    ESRD:  Plan per renal.    CHEST PAIN:  Troponin trended down.  Medical management as above.    For questions or updates, please contact [place] Please consult [email or web address] for contact info under Cardiology/STEMI.   Signed, Minus Breeding, MD  10/30/2017, 12:44 PM

## 2017-10-30 NOTE — Progress Notes (Signed)
[place] for Heparin Indication: DVT  Allergies  Allergen Reactions  . Crestor [Rosuvastatin Calcium] Other (See Comments)    Leg pain   Patient Measurements: Height: 5\' 8"  (172.7 cm) Weight: 218 lb 4.1 oz (99 kg) IBW/kg (Calculated) : 63.9 Heparin Dosing Weight: 87.7 kg  Assessment: 62 yo F presents on 1/14 with worsening renal function. Treating for left toe osteo with dapto and ceftriaxone. Transferred to [facility] for further management. Now found to have DVT on doppler. Hgb low but stable.   2/3 AM: heparin level is elevated at 0.92, heparin running peripherally and lab drawn from PICC  Goal of Therapy:  Heparin level 0.3-0.7 units/ml Monitor platelets by anticoagulation protocol: Yes   Plan:  Dec heparin to 1900 units/hr 1200 HL  Narda Bonds, PharmD, BCPS Clinical Pharmacist Phone: [phone]

## 2017-10-30 NOTE — Progress Notes (Signed)
[place] for Heparin Indication: DVT  Allergies  Allergen Reactions  . Crestor [Rosuvastatin Calcium] Other (See Comments)    Leg pain   Patient Measurements: Height: 5\' 8"  (172.7 cm) Weight: 218 lb 4.1 oz (99 kg) IBW/kg (Calculated) : 63.9 Heparin Dosing Weight: 87.7 kg  Assessment: 62 yo F presents on 1/14 with worsening renal function. Treating for left toe osteo with dapto and ceftriaxone. Transferred to [facility] for further management. Now found to have DVT on doppler. Hgb low but stable.   2/3 AM: heparin level is elevated at 0.92, heparin running peripherally and lab drawn from PICC  2/3 PM: heparin level is now undetectable, again confirmed heparin infusing peripherally with no issues  Goal of Therapy:  Heparin level 0.3-0.7 units/ml Monitor platelets by anticoagulation protocol: Yes   Plan:  Inc heparin to 2150 units/hr Re-check heparin level in 8 hours  Narda Bonds, PharmD, [place] Pharmacist Phone: [phone]

## 2017-10-30 NOTE — Progress Notes (Signed)
[place] for Heparin Indication: DVT  Allergies  Allergen Reactions  . Crestor [Rosuvastatin Calcium] Other (See Comments)    Leg pain   Patient Measurements: Height: 5\' 8"  (172.7 cm) Weight: 218 lb 4.1 oz (99 kg) IBW/kg (Calculated) : 63.9 Heparin Dosing Weight: 87.7 kg  Assessment: 62 yo F presents on 1/14 with worsening renal function. Treating for left toe osteo with dapto and ceftriaxone. Transferred to [facility] for further management. Now found to have DVT on doppler. Hgb low but stable. Last heparin level now back down to 0.18 after decrease. Will increase slightly.  Goal of Therapy:  Heparin level 0.3-0.7 units/ml Monitor platelets by anticoagulation protocol: Yes   Plan:  Increase heparin gtt to 2,000 units/hr Monitor daily heparin level, CBC, s/s of bleed  Elenor Quinones, PharmD, [facility] Clinical Pharmacist Pager [phone] 10/30/2017 12:38 PM

## 2017-10-30 NOTE — Progress Notes (Addendum)
Assessment/ Plan:    1.Acute kidney injury on chronic kidney disease stage(cr 2.4)IV:Now ESRD- I believe she is best served by dialysis(prob long term) and have informed her this. I think we should proceed with cardiovascular evaluation as appropriate. Will need AV access before discharge. HD again Monday and if cath does not work it may need replacement. CLIP  2. Acute diastolic LYY:[id number] with dialysis  3. Osteomyelitis second left toe/cellulitis: ongoing intravenous antibiotic   4. DVT, new   Subjective: Interval History: 1.1L removed with HD.  Cath malfunction.  TPA'd and will do HD in AM, may need to replace catheter.  Objective: Vital signs in last 24 hours: Temp:  [97.9 F (36.6 C)-98.6 F (37 C)] 98.6 F (37 C) (02/03 0448) Pulse Rate:  [70-89] 84 (02/03 0913) Resp:  [19-22] 21 (02/03 0448) BP: (138-164)/(41-74) 150/55 (02/03 0913) SpO2:  [96 %-99 %] 98 % (02/03 0913) Weight:  [99 kg (218 lb 4.1 oz)-99.2 kg (218 lb 11.1 oz)] 99 kg (218 lb 4.1 oz) (02/03 0448) Weight change: -1.3 kg (-13.9 oz)  Intake/Output from previous day: 02/02 0701 - 02/03 0700 In: 534.3 [P.O.:120; I.V.:414.3] Out: 1130  Intake/Output this shift: Total I/O In: 300 [P.O.:300] Out: -   General appearance: alert and cooperative Resp: diminished breath sounds bibasilar Chest wall: no tenderness Cardio: regular rate and rhythm, S1, S2 normal, no murmur, click, rub or gallop Extremities: edema 1+  Lab Results: Recent Labs    10/29/17 0414 10/30/17 0209  WBC 14.8* 14.2*  HGB 8.7* 8.4*  HCT 29.0* 27.3*  PLT 408* 370   BMET:  Recent Labs    10/28/17 0436 10/29/17 0414  NA 136 135  136  K 3.9 3.6  3.6  CL 99* 97*  98*  CO2 25 24  26   GLUCOSE 94 122*  134*  BUN 19 31*  29*  CREATININE 2.29* 3.01*  3.03*  CALCIUM 8.0* 7.9*  8.1*   No results for input(s): PTH in the last 72 hours. Iron Studies: No results for input(s): IRON, TIBC, TRANSFERRIN, FERRITIN in the  last 72 hours. Studies/Results: Dg Chest Port 1 View  Result Date: 10/29/2017 CLINICAL DATA:  Shortness of breath and chest pressure since last night EXAM: PORTABLE CHEST 1 VIEW COMPARISON:  10/27/2017, 12/13/2014 FINDINGS: Diffuse bilateral interstitial and alveolar airspace disease. More focal airspace disease in the upper lobes bilaterally, right greater than left. No pleural effusion or pneumothorax. Stable cardiomediastinal silhouette. No acute osseous abnormality. Large bore right-sided central venous catheter with the tip projecting over the right atrium. Right-sided PICC line with the tip projecting over the SVC. No acute osseous abnormality. IMPRESSION: 1. Bilateral chronic interstitial lung disease. Superimposed bilateral interstitial and alveolar airspace opacities with more prominent airspace disease in the upper lobes, right greater than left. Differential considerations include interstitial edema versus multifocal pneumonia superimposed upon chronic lung disease. Electronically Signed   By: Kathreen Devoid   On: 10/29/2017 11:09    Continuous: . sodium chloride    . sodium chloride    . cefTRIAXone (ROCEPHIN)  IV Stopped (10/29/17 2231)  . [START ON 10/31/2017] DAPTOmycin (CUBICIN)  IV    . heparin 1,900 Units/hr (10/30/17 0631)     LOS: 20 days   Estanislado Emms 10/30/2017,1:14 PM

## 2017-10-31 ENCOUNTER — Encounter (HOSPITAL_COMMUNITY)
Admission: AD | Disposition: A | Discharge status: Home Health | Payer: Self-pay | Source: Ambulatory Visit | Attending: Nephrology

## 2017-10-31 DIAGNOSIS — R0602 Shortness of breath: Secondary | ICD-10-CM

## 2017-10-31 LAB — RENAL FUNCTION PANEL
Albumin: 2.3 g/dL — ABNORMAL LOW (ref 3.5–5.0)
Anion gap: 14 (ref 5–15)
BUN: 27 mg/dL — ABNORMAL HIGH (ref 6–20)
CO2: 23 mmol/L (ref 22–32)
Calcium: 8.2 mg/dL — ABNORMAL LOW (ref 8.9–10.3)
Chloride: 99 mmol/L — ABNORMAL LOW (ref 101–111)
Creatinine, Ser: 2.55 mg/dL — ABNORMAL HIGH (ref 0.44–1.00)
GFR calc Af Amer: 22 mL/min — ABNORMAL LOW (ref >60)
GFR calc non Af Amer: 19 mL/min — ABNORMAL LOW (ref >60)
Glucose, Bld: 97 mg/dL (ref 65–99)
Phosphorus: 3.6 mg/dL (ref 2.5–4.6)
Potassium: 3.5 mmol/L (ref 3.5–5.1)
Sodium: 136 mmol/L (ref 135–145)

## 2017-10-31 LAB — CBC
HCT: 26.5 % — ABNORMAL LOW (ref 36.0–46.0)
Hemoglobin: 8 g/dL — ABNORMAL LOW (ref 12.0–15.0)
MCH: 27.8 pg (ref 26.0–34.0)
MCHC: 30.2 g/dL (ref 30.0–36.0)
MCV: 92 fL (ref 78.0–100.0)
Platelets: 385 10*3/uL (ref 150–400)
RBC: 2.88 MIL/uL — ABNORMAL LOW (ref 3.87–5.11)
RDW: 19.9 % — ABNORMAL HIGH (ref 11.5–15.5)
WBC: 12.8 10*3/uL — ABNORMAL HIGH (ref 4.0–10.5)

## 2017-10-31 LAB — HEPARIN LEVEL (UNFRACTIONATED)
Heparin Unfractionated: <0.1 IU/mL — ABNORMAL LOW (ref 0.30–0.70)
Heparin Unfractionated: 0.21 IU/mL — ABNORMAL LOW (ref 0.30–0.70)

## 2017-10-31 SURGERY — LEFT HEART CATH AND CORONARY ANGIOGRAPHY
Anesthesia: LOCAL

## 2017-10-31 MED ORDER — ALTEPLASE 2 MG IJ SOLR: 2.0000 mg | Freq: Once | INTRAMUSCULAR | Status: DC | PRN

## 2017-10-31 MED ORDER — HEPARIN SODIUM (PORCINE) 1000 UNIT/ML DIALYSIS
1000.0000 [IU] | INTRAMUSCULAR | Status: DC | PRN
  Filled 2017-10-31: qty 1

## 2017-10-31 MED ORDER — SODIUM CHLORIDE 0.9 % IV SOLN: 100.0000 mL | INTRAVENOUS | Status: DC | PRN

## 2017-10-31 MED ORDER — LIDOCAINE HCL (PF) 1 % IJ SOLN: 5.0000 mL | INTRAMUSCULAR | Status: DC | PRN

## 2017-10-31 MED ORDER — PENTAFLUOROPROP-TETRAFLUOROETH EX AERO: 1.0000 "application " | INHALATION_SPRAY | CUTANEOUS | Status: DC | PRN

## 2017-10-31 MED ORDER — HEPARIN BOLUS VIA INFUSION
1000.0000 [IU] | Freq: Once | INTRAVENOUS | Status: AC
  Administered 2017-10-31: 1000 [IU] via INTRAVENOUS
  Filled 2017-10-31: qty 1000

## 2017-10-31 MED ORDER — LIDOCAINE-PRILOCAINE 2.5-2.5 % EX CREA
1.0000 "application " | TOPICAL_CREAM | CUTANEOUS | Status: DC | PRN
  Filled 2017-10-31: qty 5

## 2017-10-31 NOTE — Procedures (Signed)
Patient seen on Hemodialysis. QB 400, UF goal 3L Treatment adjusted as needed.  Kristen Shiley MD [facility]. Office # [phone] Pager # [phone] 8:45 AM

## 2017-10-31 NOTE — Progress Notes (Addendum)
  Progress Note    10/31/2017 12:27 PM * No surgery date entered *  Subjective: no acute issues, on hd   Vitals:   10/31/17 1030 10/31/17 1100  BP: (!) 157/56 (!) 162/55  Pulse: 77 78  Resp:    Temp:    SpO2:      Physical Exam: [id number] Non labored respirations On HD via RIJ catheter Left foot dressing cdi  CBC    Component Value Date/Time   WBC 12.8 (H) 10/31/2017 0422   RBC 2.88 (L) 10/31/2017 0422   HGB 8.0 (L) 10/31/2017 0422   HGB 14.3 12/14/2016 1139   HCT 26.5 (L) 10/31/2017 0422   HCT 44.2 12/14/2016 1139   PLT 385 10/31/2017 0422   PLT 429 (H) 12/14/2016 1139   MCV 92.0 10/31/2017 0422   MCV 88.5 12/14/2016 1139   MCH 27.8 10/31/2017 0422   MCHC 30.2 10/31/2017 0422   RDW 19.9 (H) 10/31/2017 0422   RDW 17.6 (H) 12/14/2016 1139   LYMPHSABS 0.8 10/26/2017 1200   LYMPHSABS 1.9 12/14/2016 1139   MONOABS 1.9 (H) 10/26/2017 1200   MONOABS 0.7 12/14/2016 1139   EOSABS 0.3 10/26/2017 1200   EOSABS 0.0 10/17/2017 1430   BASOSABS 0.1 10/26/2017 1200   BASOSABS 0.1 12/14/2016 1139    BMET    Component Value Date/Time   NA 136 10/31/2017 0422   NA 137 12/14/2016 1139   K 3.5 10/31/2017 0422   K 4.2 12/14/2016 1139   CL 99 (L) 10/31/2017 0422   CO2 23 10/31/2017 0422   CO2 20 (L) 12/14/2016 1139   GLUCOSE 97 10/31/2017 0422   GLUCOSE 115 12/14/2016 1139   BUN 27 (H) 10/31/2017 0422   BUN 32.8 (H) 12/14/2016 1139   CREATININE 2.55 (H) 10/31/2017 0422   CREATININE 2.1 (H) 12/14/2016 1139   CALCIUM 8.2 (L) 10/31/2017 0422   CALCIUM 9.5 12/14/2016 1139   GFRNONAA 19 (L) 10/31/2017 0422   GFRAA 22 (L) 10/31/2017 0422    INR No results found for: INR   Intake/Output Summary (Last 24 hours) at 10/31/2017 1227 Last data filed at 10/31/2017 0500 Gross per 24 hour  Intake 540 ml  Output 0 ml  Net 540 ml     Assessment:  62 y.o. 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 Tibial disease by duplex  Plan: Angiogram in near future but on schedule for  cardiac cath tomorrow. Will also need toe amputation at discretion of ortho following revascularization and will need permanent hd access prior to discharge. Vein mapping ordered bue.   [name] C. Donzetta Matters, MD [facility] [place] Office: [phone] Pager: [phone]  10/31/2017 12:27 PM

## 2017-10-31 NOTE — Progress Notes (Addendum)
Progress Note  Patient Name: Kristen Berry Date of Encounter: 10/31/2017  Primary Cardiologist: Dr. Daneen Schick   Subjective   She denies any chest pain or pressure. SOB much improved today.  Inpatient Medications    Scheduled Meds: . aspirin EC  81 mg Oral Daily  . calcium acetate  667 mg Oral TID WC  . carvedilol  12.5 mg Oral BID WC  . darbepoetin (ARANESP) injection - DIALYSIS  60 mcg Intravenous Q Thu-HD  . gabapentin  100 mg Oral Daily  . hydrALAZINE  100 mg Oral Q8H  . isosorbide mononitrate  120 mg Oral Daily  . polyethylene glycol  17 g Oral Daily   Continuous Infusions: . sodium chloride    . sodium chloride    . sodium chloride    . sodium chloride    . cefTRIAXone (ROCEPHIN)  IV Stopped (10/30/17 1910)  . DAPTOmycin (CUBICIN)  IV    . heparin 2,150 Units/hr (10/30/17 2344)   PRN Meds: sodium chloride, sodium chloride, sodium chloride, sodium chloride, acetaminophen **OR** acetaminophen, alteplase, camphor-menthol **AND** hydrOXYzine, docusate sodium, feeding supplement (NEPRO CARB STEADY), heparin, heparin, hydrALAZINE, ipratropium-albuterol, lidocaine (PF), lidocaine (PF), lidocaine-prilocaine, lidocaine-prilocaine, nitroGLYCERIN, ondansetron **OR** ondansetron (ZOFRAN) IV, oxyCODONE, pentafluoroprop-tetrafluoroeth, pentafluoroprop-tetrafluoroeth, sodium chloride flush, sorbitol, zolpidem   Vital Signs    Vitals:   10/30/17 2346 10/31/17 0437 10/31/17 0800 10/31/17 0818  BP: 135/62 103/87 140/67 (!) 143/51  Pulse: (!) 107 76 (!) 109 (!) 108  Resp: 20 20 (!) 23   Temp: 98 F (36.7 C) 97.9 F (36.6 C) 98 F (36.7 C)   TempSrc: Oral Oral Oral   SpO2: 98% 96% 95%   Weight:  222 lb 3.6 oz (100.8 kg) 222 lb 10.6 oz (101 kg)   Height:        Intake/Output Summary (Last 24 hours) at 10/31/2017 0929 Last data filed at 10/31/2017 0500 Gross per 24 hour  Intake 840 ml  Output 0 ml  Net 840 ml   Filed Weights   10/30/17 0448 10/31/17 0437 10/31/17 0800    Weight: 218 lb 4.1 oz (99 kg) 222 lb 3.6 oz (100.8 kg) 222 lb 10.6 oz (101 kg)    Telemetry    NSR - Personally Reviewed  ECG    No new EKg to review - Personally Reviewed  Physical Exam   GEN: No acute distress.   Neck: No JVD Cardiac: RRR, no murmurs, rubs, or gallops.  Respiratory: Clear to auscultation bilaterally. GI: Soft, nontender, non-distended  MS: No edema; No deformity. Neuro:  Nonfocal  Psych: Normal affect   Labs    Chemistry Recent Labs  Lab 10/28/17 0436 10/29/17 0414 10/31/17 0422  NA 136 135  136 136  K 3.9 3.6  3.6 3.5  CL 99* 97*  98* 99*  CO2 25 24  26 23   GLUCOSE 94 122*  134* 97  BUN 19 31*  29* 27*  CREATININE 2.29* 3.01*  3.03* 2.55*  CALCIUM 8.0* 7.9*  8.1* 8.2*  ALBUMIN 2.4* 2.3*  2.4* 2.3*  GFRNONAA 22* 16*  15* 19*  GFRAA 25* 18*  18* 22*  ANIONGAP 12 14  12 14      Hematology Recent Labs  Lab 10/29/17 0414 10/30/17 0209 10/31/17 0422  WBC 14.8* 14.2* 12.8*  RBC 3.15* 2.99* 2.88*  HGB 8.7* 8.4* 8.0*  HCT 29.0* 27.3* 26.5*  MCV 92.1 91.3 92.0  MCH 27.6 28.1 27.8  MCHC 30.0 30.8 30.2  RDW 19.5* 19.3* 19.9*  PLT 408* 370 385    Cardiac Enzymes Recent Labs  Lab 10/25/17 1029 10/25/17 1601 10/25/17 2157 10/29/17 1100  TROPONINI 0.15* 0.15* 0.15* 0.05*   No results for input(s): TROPIPOC in the last 168 hours.   BNPNo results for input(s): BNP, PROBNP in the last 168 hours.   DDimer No results for input(s): DDIMER in the last 168 hours.   Radiology    Dg Chest Port 1 View  Result Date: 10/29/2017 CLINICAL DATA:  Shortness of breath and chest pressure since last night EXAM: PORTABLE CHEST 1 VIEW COMPARISON:  10/27/2017, 12/13/2014 FINDINGS: Diffuse bilateral interstitial and alveolar airspace disease. More focal airspace disease in the upper lobes bilaterally, right greater than left. No pleural effusion or pneumothorax. Stable cardiomediastinal silhouette. No acute osseous abnormality. Large bore  right-sided central venous catheter with the tip projecting over the right atrium. Right-sided PICC line with the tip projecting over the SVC. No acute osseous abnormality. IMPRESSION: 1. Bilateral chronic interstitial lung disease. Superimposed bilateral interstitial and alveolar airspace opacities with more prominent airspace disease in the upper lobes, right greater than left. Differential considerations include interstitial edema versus multifocal pneumonia superimposed upon chronic lung disease. Electronically Signed   By: Kathreen Devoid   On: 10/29/2017 11:09    Cardiac Studies   TTE: 10/17/17  Study Conclusions  - Left ventricle: Septal and apical akinesis. The cavity size was moderately dilated. Wall thickness was increased in a pattern of mild LVH. Systolic function was moderately reduced. The estimated ejection fraction was in the range of 35% to 40%. Doppler parameters are consistent with both elevated ventricular end-diastolic filling pressure and elevated left atrial filling pressure. - Mitral valve: Moderately calcified, moderately thickened annulus. Moderately thickened leaflets . The findings are consistent with mild stenosis. There was moderate regurgitation. Valve area by continuity equation (using LVOT flow): 1.5 cm^2. - Left atrium: The atrium was mildly dilated. - Atrial septum: No defect or patent foramen ovale was identified. - Pulmonary arteries: PA peak pressure: 55 mm Hg (S).   Patient Profile     62 y.o. female with PMH of systolic HF, HTN, HL, CKD IV, osteomyelitis and PVD who presented with worsening renal function.Developed acute respiratory distress and acute renal failure.Echo 10/17/17 with LVEF=35-40%.  Assessment & Plan    ACUTE ON CHRONIC SYSTOLIC HF:  EF as declined and now 35-40% by echo .   Given multiple comorbid issues felt not to be a cath candidate over the past few days and plan was to defer invasive coronary evaluation but  now she is on HD permanently and needs to have an AVF placed.  . - continue carvedilol 12.5mg  BID. Imdur 120mg  daily and Hydralazine 100mg  q8 hours.  - I have discussed timing of cath with [facility] and they would like [place] to proceed at this time so they can move forward with vascular issues and ortho.   - Cardiac catheterization was discussed with the patient fully. The patient understands that risks include but are not limited to stroke (1 in 1000), death (1 in 82), kidney failure [usually temporary] (1 in 500), bleeding (1 in 200), allergic reaction [possibly serious] (1 in 200).  The patient understands and is willing to proceed.   - will make NPO after MN for cath in am  AKI:  Now on dialysis probably permanently.    CHEST PAIN:  Troponin is  Mildly elevated with flat trend - continue ASA 81mg  daily, BB and statin.  - cath pending in am.  For questions or updates, please contact [place] Please consult [email or web address] for contact info under Cardiology/STEMI.      Signed, Fransico Him, MD  10/31/2017, 9:29 AM

## 2017-10-31 NOTE — Progress Notes (Addendum)
OT Cancellation Note  Patient Details Name: Kristen Berry MRN: [id number] DOB: 1955-12-22   Cancelled Treatment:    Reason Eval/Treat Not Completed: Patient at procedure or test/ unavailable. Pt off unit for HD. Will check back as able.   Norman Herrlich, MS OTR/L  Pager: [place] [name] 10/31/2017, 10:23 AM

## 2017-10-31 NOTE — Progress Notes (Signed)
PROGRESS NOTE    Kristen Berry  NWG:[id number] DOB: October 23, 1955 DOA: 10/10/2017 PCP: [place], Sarah T, MD   Brief Narrative: 62 y.o. 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  Currently on hemodialysis.  Patient has dialysis catheter.  Nephrology consult appreciated.  -The catheter is working today.  Evaluation per her outpatient dialysis arrangement.  #Acute systolic congestive heart failure/cardiomyopathy: EF of 35-40%.   -Patient does not have chest pain or shortness of breath.  I think we should proceed with cardiac evaluation if cardiology feels appropriate.  Patient might need further surgical intervention for her lower extremity including vascular and orthopedics.  Without cardiac evaluation it may be difficult to undergo other surgical intervention.  I have discussed this with Dr. Radford Pax from cardiology who will reevaluate for possible cardiac cath.  IV heparin can be hold prior to the procedure depending on requirement as per  cardiology.  On aspirin, Coreg, hydralazine, Imdur  #Anemia of chronic kidney disease: Continue Aranesp and IV iron during the dialysis.  #Chest pain/shortness of breath likely due to fluid overload and pulmonary edema: Feeling good today.  No chest pain or shortness of breath.  #Left second toe osteomyelitis/peripheral vascular disease: Orthopedics was consulted, as per prior orthopedics note, the plan is to have Dr. Sharol Given evaluation done on 2/4.  -Evaluated by vascular surgeon, has abnormal ABI, likely needs aortogram and lower extremity angiogram for possible revascularization.  For now continue broad-spectrum antibiotics, ceftriaxone and daptomycin.  Follow-up consultants evaluation  #Right lower extremity DVT: Started on IV heparin.  No sign of bleeding.  May need long-term oral anticoagulation  after all procedure and investigation done.  Discussed with the patient.  #Essential hypertension: Monitor blood pressure.  Continue current medication.  Dialysis per nephrology.  #Physical deconditioning: PT OT evaluation.  DVT prophylaxis: heparin Code Status:full code Family Communication: heart healthy Disposition Plan: Likely discharge home versus rehab in 5-6 days.    Consultants:   Cardiology  Vascular surgery  Orthopedics  Nephrology  Procedures: Multiple imaging studies Antimicrobials: Rocephin and daptomycin  Subjective: Seen and examined at dialysis unit.  Denies chest pain, shortness of breath.  Catheter working.   Objective: Vitals:   10/31/17 0823 10/31/17 0830 10/31/17 0900 10/31/17 0930  BP: (!) 145/65 (!) 147/63 132/60 (!) 161/58  Pulse: (!) 108 76 77 (!) 112  Resp:      Temp:      TempSrc:      SpO2:      Weight:      Height:        Intake/Output Summary (Last 24 hours) at 10/31/2017 1014 Last data filed at 10/31/2017 0500 Gross per 24 hour  Intake  540 ml  Output 0 ml  Net 540 ml   Filed Weights   10/30/17 0448 10/31/17 0437 10/31/17 0800  Weight:  99 kg (218 lb 4.1 oz) 100.8 kg (222 lb 3.6 oz) 101 kg (222 lb 10.6 oz)    Examination:  General exam: Not in distress. Respiratory system: Bibasilar decreased breath sounds, no wheezing.  Respiratory effort normal. Cardiovascular system: Regular rate rhythm S1-S2 normal.  No pedal edema. Gastrointestinal system: Abdomen soft.  Bowel sounds positive.  Nontender. Central nervous system: Alert and oriented. No focal neurological deficits. Extremities: Symmetric 5 x 5 power. Psychiatry: Judgement and insight appear normal. Mood & affect appropriate.     Data Reviewed: I have personally reviewed following labs and imaging studies  CBC: Recent Labs  Lab 10/26/17 1200 10/26/17 2002 10/27/17 1730 10/29/17 0414 10/30/17 0209 10/31/17 0422  WBC 21.5* 19.6* 18.3* 14.8* 14.2* 12.8*  NEUTROABS 18.4*  --   --   --   --   --   HGB 9.1* 9.3* 8.6* 8.7* 8.4* 8.0*  HCT 29.7* 30.1* 28.6* 29.0* 27.3* 26.5*  MCV 91.1 90.9 91.1 92.1 91.3 92.0  PLT 462* 416* 445* 408* 370 370   Basic Metabolic Panel: Recent Labs  Lab 10/26/17 2002 10/27/17 0322 10/28/17 0436 10/29/17 0414 10/31/17 0422  NA 136 137 136 135  136 136  K 3.4* 3.3* 3.9 3.6  3.6 3.5  CL 98* 98* 99* 97*  98* 99*  CO2 26 26 25 24  26 23   GLUCOSE 219* 117* 94 122*  134* 97  BUN 13 18 19  31*  29* 27*  CREATININE 1.48* 1.90* 2.29* 3.01*  3.03* 2.55*  CALCIUM 8.1* 8.0* 8.0* 7.9*  8.1* 8.2*  PHOS 1.7* 2.2* 2.5 2.8  2.6 3.6   GFR: Estimated Creatinine Clearance: 28.4 mL/min (A) (by C-G formula based on SCr of 2.55 mg/dL (H)). Liver Function Tests: Recent Labs  Lab 10/26/17 2002 10/27/17 0322 10/28/17 0436 10/29/17 0414 10/31/17 0422  ALBUMIN 2.3* 2.3* 2.4* 2.3*  2.4* 2.3*   No results for input(s): LIPASE, AMYLASE in the last 168 hours. No results for input(s): AMMONIA in the last 168 hours. Coagulation Profile: No results for input(s): INR, PROTIME in the last 168 hours. Cardiac Enzymes: Recent Labs  Lab  10/25/17 1029 10/25/17 1601 10/25/17 2157 10/27/17 0322 10/28/17 0436 10/29/17 1100  CKTOTAL  --   --   --  12* 16*  --   TROPONINI 0.15* 0.15* 0.15*  --   --  0.05*   BNP (last 3 results) No results for input(s): PROBNP in the last 8760 hours. HbA1C: No results for input(s): HGBA1C in the last 72 hours. CBG: No results for input(s): GLUCAP in the last 168 hours. Lipid Profile: No results for input(s): CHOL, HDL, LDLCALC, TRIG, CHOLHDL, LDLDIRECT in the last 72 hours. Thyroid Function Tests: No results for input(s): TSH, T4TOTAL, FREET4, T3FREE, THYROIDAB in the last 72 hours. Anemia Panel: No results for input(s): VITAMINB12, FOLATE, FERRITIN, TIBC, IRON, RETICCTPCT in the last 72 hours. Sepsis Labs: Recent Labs  Lab 10/26/17 2002  PROCALCITON 1.25    No results found for this or any previous visit (from the past 240 hour(s)).       Radiology Studies: Dg Chest Port 1 View  Result Date: 10/29/2017 CLINICAL DATA:  Shortness of breath and chest pressure since last night EXAM: PORTABLE CHEST 1 VIEW COMPARISON:  10/27/2017, 12/13/2014 FINDINGS: Diffuse bilateral interstitial and alveolar airspace disease. More focal airspace disease in the  upper lobes bilaterally, right greater than left. No pleural effusion or pneumothorax. Stable cardiomediastinal silhouette. No acute osseous abnormality. Large bore right-sided central venous catheter with the tip projecting over the right atrium. Right-sided PICC line with the tip projecting over the SVC. No acute osseous abnormality. IMPRESSION: 1. Bilateral chronic interstitial lung disease. Superimposed bilateral interstitial and alveolar airspace opacities with more prominent airspace disease in the upper lobes, right greater than left. Differential considerations include interstitial edema versus multifocal pneumonia superimposed upon chronic lung disease. Electronically Signed   By: Kathreen Devoid   On: 10/29/2017 11:09        Scheduled  Meds: . aspirin EC  81 mg Oral Daily  . calcium acetate  667 mg Oral TID WC  . carvedilol  12.5 mg Oral BID WC  . darbepoetin (ARANESP) injection - DIALYSIS  60 mcg Intravenous Q Thu-HD  . gabapentin  100 mg Oral Daily  . hydrALAZINE  100 mg Oral Q8H  . isosorbide mononitrate  120 mg Oral Daily  . polyethylene glycol  17 g Oral Daily   Continuous Infusions: . sodium chloride    . sodium chloride    . sodium chloride    . sodium chloride    . cefTRIAXone (ROCEPHIN)  IV Stopped (10/30/17 1910)  . DAPTOmycin (CUBICIN)  IV    . heparin 2,150 Units/hr (10/30/17 2344)     LOS: 21 days    Dron Tanna Furry, MD [facility] Pager [phone]  If 7PM-7AM, please contact night-coverage [email or web address] Password [email or web address] 10/31/2017, 10:14 AM

## 2017-10-31 NOTE — Progress Notes (Signed)
ANTICOAGULATION CONSULT NOTE - Follow Up Consult  Pharmacy Consult for Heparin Indication: DVT  Allergies  Allergen Reactions  . Crestor [Rosuvastatin Calcium] Other (See Comments)    Leg pain    Patient Measurements: Height: 5\' 8"  (172.7 cm) Weight: 222 lb 10.6 oz (101 kg) IBW/kg (Calculated) : 63.9 Heparin Dosing Weight: 85 kg  Vital Signs: Temp: 98.4 F (36.9 C) (02/04 2108) Temp Source: Oral (02/04 2108) BP: 120/44 (02/04 2108) Pulse Rate: 80 (02/04 2108)  Labs: Recent Labs    10/29/17 0414 10/29/17 1100  10/30/17 0209  10/30/17 2225 10/31/17 0422 10/31/17 0800 10/31/17 2238  HGB 8.7*  --   --  8.4*  --   --  8.0*  --   --   HCT 29.0*  --   --  27.3*  --   --  26.5*  --   --   PLT 408*  --   --  370  --   --  385  --   --   HEPARINUNFRC <0.10*  --    < > 0.92*   < > <0.10*  --  <0.10* 0.21*  CREATININE 3.01*  3.03*  --   --   --   --   --  2.55*  --   --   TROPONINI  --  0.05*  --   --   --   --   --   --   --    < > = values in this interval not displayed.    Estimated Creatinine Clearance: 28.4 mL/min (A) (by C-G formula based on SCr of 2.55 mg/dL (H)).   Assessment: 62 y.o. female with new DVT for heparin Goal of Therapy:  Heparin level 0.3-0.7 units/ml Monitor platelets by anticoagulation protocol: Yes   Plan:  Increase Heparin  2500 units/hr Follow-up am labs.   Caryl Pina 10/31/2017,11:27 PM

## 2017-10-31 NOTE — Progress Notes (Signed)
[place] for Heparin Indication: DVT  Allergies  Allergen Reactions  . Crestor [Rosuvastatin Calcium] Other (See Comments)    Leg pain   Patient Measurements: Height: 5\' 8"  (172.7 cm) Weight: 222 lb 10.6 oz (101 kg) IBW/kg (Calculated) : 63.9 Heparin Dosing Weight: 87.7 kg  Assessment: 62 yo F presents on 1/14 with worsening renal function. Treating for left toe osteo with dapto and ceftriaxone. Transferred to [facility] for further management. Now found to have DVT on doppler. Hgb low but stable.   2/3 AM: heparin level is elevated at 0.92, heparin running peripherally and lab drawn from PICC  2/3 PM: heparin level is now undetectable, again confirmed heparin infusing peripherally with no issues  2/4 Heparin level remains undetectable. Discussed with HD nurse. HL was drawn from arterial line and heparin is infusing peripherally.   Goal of Therapy:  Heparin level 0.3-0.7 units/ml Monitor platelets by anticoagulation protocol: Yes   Plan:  Give heparin 1000 units bolus, then Inc heparin to 2300 units/hr Re-check heparin level in 8 hours  Albertina Parr, PharmD., BCPS Clinical Pharmacist Pager [phone]

## 2017-10-31 NOTE — H&P (View-Only) (Signed)
Progress Note  Patient Name: Kristen Berry Date of Encounter: 10/31/2017  Primary Cardiologist: Dr. Daneen Schick   Subjective   She denies any chest pain or pressure. SOB much improved today.  Inpatient Medications    Scheduled Meds: . aspirin EC  81 mg Oral Daily  . calcium acetate  667 mg Oral TID WC  . carvedilol  12.5 mg Oral BID WC  . darbepoetin (ARANESP) injection - DIALYSIS  60 mcg Intravenous Q Thu-HD  . gabapentin  100 mg Oral Daily  . hydrALAZINE  100 mg Oral Q8H  . isosorbide mononitrate  120 mg Oral Daily  . polyethylene glycol  17 g Oral Daily   Continuous Infusions: . sodium chloride    . sodium chloride    . sodium chloride    . sodium chloride    . cefTRIAXone (ROCEPHIN)  IV Stopped (10/30/17 1910)  . DAPTOmycin (CUBICIN)  IV    . heparin 2,150 Units/hr (10/30/17 2344)   PRN Meds: sodium chloride, sodium chloride, sodium chloride, sodium chloride, acetaminophen **OR** acetaminophen, alteplase, camphor-menthol **AND** hydrOXYzine, docusate sodium, feeding supplement (NEPRO CARB STEADY), heparin, heparin, hydrALAZINE, ipratropium-albuterol, lidocaine (PF), lidocaine (PF), lidocaine-prilocaine, lidocaine-prilocaine, nitroGLYCERIN, ondansetron **OR** ondansetron (ZOFRAN) IV, oxyCODONE, pentafluoroprop-tetrafluoroeth, pentafluoroprop-tetrafluoroeth, sodium chloride flush, sorbitol, zolpidem   Vital Signs    Vitals:   10/30/17 2346 10/31/17 0437 10/31/17 0800 10/31/17 0818  BP: 135/62 103/87 140/67 (!) 143/51  Pulse: (!) 107 76 (!) 109 (!) 108  Resp: 20 20 (!) 23   Temp: 98 F (36.7 C) 97.9 F (36.6 C) 98 F (36.7 C)   TempSrc: Oral Oral Oral   SpO2: 98% 96% 95%   Weight:  222 lb 3.6 oz (100.8 kg) 222 lb 10.6 oz (101 kg)   Height:        Intake/Output Summary (Last 24 hours) at 10/31/2017 0929 Last data filed at 10/31/2017 0500 Gross per 24 hour  Intake 840 ml  Output 0 ml  Net 840 ml   Filed Weights   10/30/17 0448 10/31/17 0437 10/31/17 0800    Weight: 218 lb 4.1 oz (99 kg) 222 lb 3.6 oz (100.8 kg) 222 lb 10.6 oz (101 kg)    Telemetry    NSR - Personally Reviewed  ECG    No new EKg to review - Personally Reviewed  Physical Exam   GEN: No acute distress.   Neck: No JVD Cardiac: RRR, no murmurs, rubs, or gallops.  Respiratory: Clear to auscultation bilaterally. GI: Soft, nontender, non-distended  MS: No edema; No deformity. Neuro:  Nonfocal  Psych: Normal affect   Labs    Chemistry Recent Labs  Lab 10/28/17 0436 10/29/17 0414 10/31/17 0422  NA 136 135  136 136  K 3.9 3.6  3.6 3.5  CL 99* 97*  98* 99*  CO2 25 24  26 23   GLUCOSE 94 122*  134* 97  BUN 19 31*  29* 27*  CREATININE 2.29* 3.01*  3.03* 2.55*  CALCIUM 8.0* 7.9*  8.1* 8.2*  ALBUMIN 2.4* 2.3*  2.4* 2.3*  GFRNONAA 22* 16*  15* 19*  GFRAA 25* 18*  18* 22*  ANIONGAP 12 14  12 14      Hematology Recent Labs  Lab 10/29/17 0414 10/30/17 0209 10/31/17 0422  WBC 14.8* 14.2* 12.8*  RBC 3.15* 2.99* 2.88*  HGB 8.7* 8.4* 8.0*  HCT 29.0* 27.3* 26.5*  MCV 92.1 91.3 92.0  MCH 27.6 28.1 27.8  MCHC 30.0 30.8 30.2  RDW 19.5* 19.3* 19.9*  PLT 408* 370 385    Cardiac Enzymes Recent Labs  Lab 10/25/17 1029 10/25/17 1601 10/25/17 2157 10/29/17 1100  TROPONINI 0.15* 0.15* 0.15* 0.05*   No results for input(s): TROPIPOC in the last 168 hours.   BNPNo results for input(s): BNP, PROBNP in the last 168 hours.   DDimer No results for input(s): DDIMER in the last 168 hours.   Radiology    Dg Chest Port 1 View  Result Date: 10/29/2017 CLINICAL DATA:  Shortness of breath and chest pressure since last night EXAM: PORTABLE CHEST 1 VIEW COMPARISON:  10/27/2017, 12/13/2014 FINDINGS: Diffuse bilateral interstitial and alveolar airspace disease. More focal airspace disease in the upper lobes bilaterally, right greater than left. No pleural effusion or pneumothorax. Stable cardiomediastinal silhouette. No acute osseous abnormality. Large bore  right-sided central venous catheter with the tip projecting over the right atrium. Right-sided PICC line with the tip projecting over the SVC. No acute osseous abnormality. IMPRESSION: 1. Bilateral chronic interstitial lung disease. Superimposed bilateral interstitial and alveolar airspace opacities with more prominent airspace disease in the upper lobes, right greater than left. Differential considerations include interstitial edema versus multifocal pneumonia superimposed upon chronic lung disease. Electronically Signed   By: Kathreen Devoid   On: 10/29/2017 11:09    Cardiac Studies   TTE: 10/17/17  Study Conclusions  - Left ventricle: Septal and apical akinesis. The cavity size was moderately dilated. Wall thickness was increased in a pattern of mild LVH. Systolic function was moderately reduced. The estimated ejection fraction was in the range of 35% to 40%. Doppler parameters are consistent with both elevated ventricular end-diastolic filling pressure and elevated left atrial filling pressure. - Mitral valve: Moderately calcified, moderately thickened annulus. Moderately thickened leaflets . The findings are consistent with mild stenosis. There was moderate regurgitation. Valve area by continuity equation (using LVOT flow): 1.5 cm^2. - Left atrium: The atrium was mildly dilated. - Atrial septum: No defect or patent foramen ovale was identified. - Pulmonary arteries: PA peak pressure: 55 mm Hg (S).   Patient Profile     62 y.o. female with PMH of systolic HF, HTN, HL, CKD IV, osteomyelitis and PVD who presented with worsening renal function.Developed acute respiratory distress and acute renal failure.Echo 10/17/17 with LVEF=35-40%.  Assessment & Plan    ACUTE ON CHRONIC SYSTOLIC HF:  EF as declined and now 35-40% by echo .   Given multiple comorbid issues felt not to be a cath candidate over the past few days and plan was to defer invasive coronary evaluation but  now she is on HD permanently and needs to have an AVF placed.  . - continue carvedilol 12.5mg  BID. Imdur 120mg  daily and Hydralazine 100mg  q8 hours.  - I have discussed timing of cath with [facility] and they would like [place] to proceed at this time so they can move forward with vascular issues and ortho.   - Cardiac catheterization was discussed with the patient fully. The patient understands that risks include but are not limited to stroke (1 in 1000), death (1 in 18), kidney failure [usually temporary] (1 in 500), bleeding (1 in 200), allergic reaction [possibly serious] (1 in 200).  The patient understands and is willing to proceed.   - will make NPO after MN for cath in am  AKI:  Now on dialysis probably permanently.    CHEST PAIN:  Troponin is  Mildly elevated with flat trend - continue ASA 81mg  daily, BB and statin.  - cath pending in am.  For questions or updates, please contact [place] Please consult [email or web address] for contact info under Cardiology/STEMI.      Signed, Fransico Him, MD  10/31/2017, 9:29 AM

## 2017-10-31 NOTE — Progress Notes (Signed)
PT Cancellation Note  Patient Details Name: Kristen Berry MRN: [id number] DOB: 03-23-1956   Cancelled Treatment:    Reason Eval/Treat Not Completed: Patient at procedure or test/unavailable. Pt off unit at this time. Will check back as schedule allows to continue with PT POC.    [name] 10/31/2017, 8:12 AM   [name], PT, DPT Acute Rehabilitation Services Pager: [phone]

## 2017-11-01 ENCOUNTER — Encounter (HOSPITAL_COMMUNITY)
Admission: AD | Disposition: A | Discharge status: Home Health | Payer: Self-pay | Source: Ambulatory Visit | Attending: Nephrology

## 2017-11-01 ENCOUNTER — Encounter (HOSPITAL_COMMUNITY): Payer: Self-pay

## 2017-11-01 DIAGNOSIS — I25118 Atherosclerotic heart disease of native coronary artery with other forms of angina pectoris: Secondary | ICD-10-CM

## 2017-11-01 HISTORY — PX: LEFT HEART CATH AND CORONARY ANGIOGRAPHY: CATH118249

## 2017-11-01 HISTORY — PX: ULTRASOUND GUIDANCE FOR VASCULAR ACCESS: SHX6516

## 2017-11-01 LAB — PROTIME-INR
INR: 1.48
Prothrombin Time: 17.8 seconds — ABNORMAL HIGH (ref 11.4–15.2)

## 2017-11-01 LAB — CBC
HCT: 27.3 % — ABNORMAL LOW (ref 36.0–46.0)
Hemoglobin: 8 g/dL — ABNORMAL LOW (ref 12.0–15.0)
MCH: 27.3 pg (ref 26.0–34.0)
MCHC: 29.3 g/dL — ABNORMAL LOW (ref 30.0–36.0)
MCV: 93.2 fL (ref 78.0–100.0)
Platelets: 402 10*3/uL — ABNORMAL HIGH (ref 150–400)
RBC: 2.93 MIL/uL — ABNORMAL LOW (ref 3.87–5.11)
RDW: 19.8 % — ABNORMAL HIGH (ref 11.5–15.5)
WBC: 10.8 10*3/uL — ABNORMAL HIGH (ref 4.0–10.5)

## 2017-11-01 LAB — BASIC METABOLIC PANEL
Anion gap: 14 (ref 5–15)
BUN: 12 mg/dL (ref 6–20)
CO2: 26 mmol/L (ref 22–32)
Calcium: 8 mg/dL — ABNORMAL LOW (ref 8.9–10.3)
Chloride: 97 mmol/L — ABNORMAL LOW (ref 101–111)
Creatinine, Ser: 1.76 mg/dL — ABNORMAL HIGH (ref 0.44–1.00)
GFR calc Af Amer: 35 mL/min — ABNORMAL LOW (ref >60)
GFR calc non Af Amer: 30 mL/min — ABNORMAL LOW (ref >60)
Glucose, Bld: 105 mg/dL — ABNORMAL HIGH (ref 65–99)
Potassium: 3.7 mmol/L (ref 3.5–5.1)
Sodium: 137 mmol/L (ref 135–145)

## 2017-11-01 LAB — GLUCOSE, CAPILLARY: Glucose-Capillary: 121 mg/dL — ABNORMAL HIGH (ref 65–99)

## 2017-11-01 LAB — POCT ACTIVATED CLOTTING TIME
Activated Clotting Time: 202 seconds
Activated Clotting Time: 180 seconds

## 2017-11-01 LAB — SURGICAL PCR SCREEN
MRSA, PCR: NEGATIVE
Staphylococcus aureus: NEGATIVE

## 2017-11-01 LAB — HEPARIN LEVEL (UNFRACTIONATED): Heparin Unfractionated: 0.15 IU/mL — ABNORMAL LOW (ref 0.30–0.70)

## 2017-11-01 SURGERY — LEFT HEART CATH AND CORONARY ANGIOGRAPHY
Anesthesia: LOCAL

## 2017-11-01 MED ORDER — ASPIRIN 81 MG PO CHEW: 81.0000 mg | CHEWABLE_TABLET | ORAL | Status: DC

## 2017-11-01 MED ORDER — SODIUM CHLORIDE 0.9 % IV SOLN: 250.0000 mL | INTRAVENOUS | Status: DC | PRN

## 2017-11-01 MED ORDER — MIDAZOLAM HCL 2 MG/2ML IJ SOLN
INTRAMUSCULAR | Status: AC
  Filled 2017-11-01: qty 2

## 2017-11-01 MED ORDER — SODIUM CHLORIDE 0.9% FLUSH
3.0000 mL | Freq: Two times a day (BID) | INTRAVENOUS | Status: DC
  Administered 2017-11-01: 3 mL via INTRAVENOUS

## 2017-11-01 MED ORDER — LIDOCAINE HCL (PF) 1 % IJ SOLN
INTRAMUSCULAR | Status: DC | PRN
  Administered 2017-11-01: 2 mL
  Administered 2017-11-01: 21 mL

## 2017-11-01 MED ORDER — OXYCODONE HCL 5 MG PO TABS
5.0000 mg | ORAL_TABLET | ORAL | Status: DC | PRN
  Administered 2017-11-02: 5 mg via ORAL
  Filled 2017-11-01 (×2): qty 1

## 2017-11-01 MED ORDER — SODIUM CHLORIDE 0.9 % IV SOLN: INTRAVENOUS | Status: DC

## 2017-11-01 MED ORDER — NITROGLYCERIN 1 MG/10 ML FOR IR/CATH LAB
INTRA_ARTERIAL | Status: DC | PRN
  Administered 2017-11-01: 200 ug via INTRA_ARTERIAL

## 2017-11-01 MED ORDER — HEPARIN SODIUM (PORCINE) 1000 UNIT/ML IJ SOLN
INTRAMUSCULAR | Status: DC | PRN
  Administered 2017-11-01: 5000 [IU] via INTRAVENOUS

## 2017-11-01 MED ORDER — HEPARIN (PORCINE) IN NACL 2-0.9 UNIT/ML-% IJ SOLN
INTRAMUSCULAR | Status: AC | PRN
  Administered 2017-11-01: 1000 mL

## 2017-11-01 MED ORDER — FENTANYL CITRATE (PF) 100 MCG/2ML IJ SOLN
INTRAMUSCULAR | Status: AC
  Filled 2017-11-01: qty 2

## 2017-11-01 MED ORDER — HEPARIN (PORCINE) IN NACL 2-0.9 UNIT/ML-% IJ SOLN
INTRAMUSCULAR | Status: AC
  Filled 2017-11-01: qty 1000

## 2017-11-01 MED ORDER — VERAPAMIL HCL 2.5 MG/ML IV SOLN
INTRA_ARTERIAL | Status: DC | PRN
  Administered 2017-11-01: 10 mL via INTRA_ARTERIAL

## 2017-11-01 MED ORDER — SODIUM CHLORIDE 0.9% FLUSH: 3.0000 mL | INTRAVENOUS | Status: DC | PRN

## 2017-11-01 MED ORDER — ACETAMINOPHEN 325 MG PO TABS: 650.0000 mg | ORAL_TABLET | ORAL | Status: DC | PRN

## 2017-11-01 MED ORDER — VERAPAMIL HCL 2.5 MG/ML IV SOLN
INTRAVENOUS | Status: AC
  Filled 2017-11-01: qty 2

## 2017-11-01 MED ORDER — HEPARIN (PORCINE) IN NACL 100-0.45 UNIT/ML-% IJ SOLN
2800.0000 [IU]/h | INTRAMUSCULAR | Status: DC
  Administered 2017-11-01 – 2017-11-02 (×3): 2700 [IU]/h via INTRAVENOUS
  Administered 2017-11-03: 13:00:00 2800 [IU]/h via INTRAVENOUS
  Administered 2017-11-03: 2700 [IU]/h via INTRAVENOUS
  Filled 2017-11-01 (×5): qty 250

## 2017-11-01 MED ORDER — SODIUM CHLORIDE 0.9% FLUSH: 3.0000 mL | Freq: Two times a day (BID) | INTRAVENOUS | Status: DC

## 2017-11-01 MED ORDER — IOHEXOL 350 MG/ML SOLN
INTRAVENOUS | Status: DC | PRN
  Administered 2017-11-01: 90 mL via INTRAVENOUS

## 2017-11-01 MED ORDER — ASPIRIN 81 MG PO CHEW
81.0000 mg | CHEWABLE_TABLET | Freq: Every day | ORAL | Status: DC
  Administered 2017-11-02 – 2017-11-07 (×6): 81 mg via ORAL
  Filled 2017-11-01 (×6): qty 1

## 2017-11-01 MED ORDER — SODIUM CHLORIDE 0.9 % IV SOLN
INTRAVENOUS | Status: DC
  Administered 2017-11-01: 09:00:00 via INTRAVENOUS

## 2017-11-01 MED ORDER — HEPARIN SODIUM (PORCINE) 1000 UNIT/ML IJ SOLN
INTRAMUSCULAR | Status: AC
  Filled 2017-11-01: qty 1

## 2017-11-01 MED ORDER — FENTANYL CITRATE (PF) 100 MCG/2ML IJ SOLN
INTRAMUSCULAR | Status: DC | PRN
  Administered 2017-11-01 (×2): 25 ug via INTRAVENOUS

## 2017-11-01 MED ORDER — DARBEPOETIN ALFA 100 MCG/0.5ML IJ SOSY
100.0000 ug | PREFILLED_SYRINGE | INTRAMUSCULAR | Status: DC
  Administered 2017-11-02: 100 ug via INTRAVENOUS
  Filled 2017-11-01 (×2): qty 0.5

## 2017-11-01 MED ORDER — ONDANSETRON HCL 4 MG/2ML IJ SOLN: 4.0000 mg | Freq: Four times a day (QID) | INTRAMUSCULAR | Status: DC | PRN

## 2017-11-01 MED ORDER — NITROGLYCERIN 1 MG/10 ML FOR IR/CATH LAB
INTRA_ARTERIAL | Status: AC
  Filled 2017-11-01: qty 10

## 2017-11-01 MED ORDER — LIDOCAINE HCL (PF) 1 % IJ SOLN
INTRAMUSCULAR | Status: AC
  Filled 2017-11-01: qty 30

## 2017-11-01 MED ORDER — MIDAZOLAM HCL 2 MG/2ML IJ SOLN
INTRAMUSCULAR | Status: DC | PRN
  Administered 2017-11-01: 0.5 mg via INTRAVENOUS

## 2017-11-01 MED ORDER — VERAPAMIL HCL 2.5 MG/ML IV SOLN
INTRAVENOUS | Status: DC | PRN
  Administered 2017-11-01: 10 mL via INTRA_ARTERIAL

## 2017-11-01 SURGICAL SUPPLY — 19 items
CATH 5FR JL3.5 JR4 ANG PIG MP (CATHETERS) ×3 IMPLANT
CATH INFINITI 4FR JL3.5 (CATHETERS) ×3 IMPLANT
CATH INFINITI MULTIPACK ANG 4F (CATHETERS) ×3 IMPLANT
CATH LAUNCHER 5F EBU3.0 (CATHETERS) ×2 IMPLANT
CATHETER LAUNCHER 5F EBU3.0 (CATHETERS) ×3
COVER PRB 48X5XTLSCP FOLD TPE (BAG) ×2 IMPLANT
COVER PROBE 5X48 (BAG) ×1
DEVICE RAD COMP TR BAND LRG (VASCULAR PRODUCTS) ×3 IMPLANT
GLIDESHEATH SLEND A-KIT 6F 22G (SHEATH) ×3 IMPLANT
GUIDEWIRE INQWIRE 1.5J.035X260 (WIRE) ×2 IMPLANT
INQWIRE 1.5J .035X260CM (WIRE) ×3
KIT HEART LEFT (KITS) ×3 IMPLANT
PACK CARDIAC CATHETERIZATION (CUSTOM PROCEDURE TRAY) ×3 IMPLANT
SHEATH GLIDE SLENDER 4/5FR (SHEATH) ×3 IMPLANT
SHEATH PINNACLE 5F 10CM (SHEATH) ×3 IMPLANT
TRANSDUCER W/STOPCOCK (MISCELLANEOUS) ×3 IMPLANT
TUBING CIL FLEX 10 FLL-RA (TUBING) ×3 IMPLANT
WIRE EMERALD 3MM-J .035X150CM (WIRE) ×3 IMPLANT
WIRE HI TORQ VERSACORE-J 145CM (WIRE) ×3 IMPLANT

## 2017-11-01 NOTE — Progress Notes (Addendum)
Site area: RFA Site Prior to Removal:  Level 0 Pressure Applied For:25 min Manual:   yes Patient Status During Pull:  stable Post Pull Site:  Level 0 Post Pull Instructions Given:  yes Post Pull Pulses Present: doppler Dressing Applied:  tegaderm Bedrest begins @ 3664 till 1745 Comments:

## 2017-11-01 NOTE — Progress Notes (Signed)
[place] for Heparin Indication: DVT  Allergies  Allergen Reactions  . Crestor [Rosuvastatin Calcium] Other (See Comments)    Leg pain   Patient Measurements: Height: 5\' 8"  (172.7 cm) Weight: 224 lb 13.9 oz (102 kg) IBW/kg (Calculated) : 63.9 Heparin Dosing Weight: 87.7 kg  Assessment: 62 yo F presents on 1/14 with worsening renal function. Treating for left toe osteo with dapto and ceftriaxone. Transferred to [facility] for further management. Now found to have DVT on doppler. Heparin is now running at 2500 units/hr (~25 units/kg/hr) and having a difficult time getting heparin levels therapeutic. Hgb low but stable. Plt 402k. RN reports no s/s of bleeding   Goal of Therapy:  Heparin level 0.3-0.7 units/ml Monitor platelets by anticoagulation protocol: Yes   Plan:  Increase IV heparin to 2700 units/hr (~26 units/kg/hr) Cath planned at 1030. F/u resuming IV heparin after cath. Monitor CBC and s/s of bleeding  If heparin levels remain low as we approach 30 units/kg/hr, consider heparin resistance and would recommend obtaining ATIII level.    Albertina Parr, PharmD., BCPS Clinical Pharmacist Pager [phone]

## 2017-11-01 NOTE — Interval H&P Note (Signed)
Cath Lab Visit (complete for each Cath Lab visit)  Clinical Evaluation Leading to the Procedure:   ACS: No  Non-ACS:    Anginal Classification: CCS III  Anti-ischemic medical therapy: Maximal Therapy (2 or more classes of medications)  Non-Invasive Test Results: No non-invasive testing performed  Prior CABG: No previous CABG      History and Physical Interval Note:  11/01/2017 11:11 AM  Kristen Berry  has presented today for surgery, with the diagnosis of chest pain  The various methods of treatment have been discussed with the patient and family. After consideration of risks, benefits and other options for treatment, the patient has consented to  Procedure(s): LEFT HEART CATH AND CORONARY ANGIOGRAPHY (N/A) Ultrasound Guidance For Vascular Access as a surgical intervention .  The patient's history has been reviewed, patient examined, no change in status, stable for surgery.  I have reviewed the patient's chart and labs.  Questions were answered to the patient's satisfaction.     Belva Crome III

## 2017-11-01 NOTE — Progress Notes (Addendum)
OT Cancellation Note  Patient Details Name: ALLANA SHRESTHA MRN: [id number] DOB: [birth year]   Cancelled Treatment:    Reason Eval/Treat Not Completed: Patient at procedure or test/ unavailable. Pt off unit in cath lab. Will check back as schedule allows.   Addendum 13:51: Pt on bedrest s/p heart cath until 1745. Will follow up next date.  Norman Herrlich, MS OTR/L  Pager: [place] A Yachet Mattson 11/01/2017, 1:27 PM

## 2017-11-01 NOTE — Progress Notes (Signed)
   From vascular standpoint she needs to have an arteriogram of her left leg followed by left 2nd toe amputation and will need a tunneled hd catheter and permanent upper extremity access prior to d/c. She is to have coronary angiogram today and I have discussed with Dr. Tamala Julian. The above procedures are all pending her complete coronary workup as her toe is necrotic but no longer cellulitic. Continue antibiotics and will hopefully be able get lower extremity angiogram performed by end of the week. [place] for heparin and antiplatelet agents at discretion of cardiology. I am out tomorrow but will f/u today's results and re-visit patient on Thursday.  Quianna Avery C. Donzetta Matters, MD [facility] Office: [phone] Pager: [phone]

## 2017-11-01 NOTE — Progress Notes (Signed)
ANTICOAGULATION CONSULT NOTE - follow up  Pharmacy Consult for Heparin Indication: DVT  Allergies  Allergen Reactions  . Crestor [Rosuvastatin Calcium] Other (See Comments)    Leg pain   Patient Measurements: Height: 5\' 8"  (172.7 cm) Weight: 224 lb 13.9 oz (102 kg) IBW/kg (Calculated) : 63.9 Heparin Dosing Weight: 87.7 kg  Assessment: 62 yo F presented on 1/14 with worsening renal function. Treating for left toe osteo with dapto and ceftriaxone. Transferred to [facility] for further management. Found to have DVT on doppler and started on IV heparin infusion. Noted that  having a difficult time getting heparin levels therapeutic and heparin drip rate was increased this AM to 2700 units/hr.  Hgb low but stable. Plt 402k Now s/p cardiac cath today 11/01/17. Heparin to be resumed 8 hours post sheath removal. Sheath removed at 12:26 per Cath procedure log.  No s/s of bleeding or hematoma noted.  Goal of Therapy:  Heparin level 0.3-0.7 units/ml Monitor platelets by anticoagulation protocol: Yes   Plan:  8 hours post sheath out, thus at 20:30 tonight resume IV heparin to 2700 units/hr (~26 units/kg/hr) Check heparin level 6 hours after heparin resumed Daily heparin level and CBC. Monitor CBC and s/s of bleeding  If heparin levels remain low as we approach 30 units/kg/hr, consider heparin resistance and would recommend obtaining ATIII level.    Thank you for allowing pharmacy to be part of this patients care team. Nicole Cella, [place] Clinical Pharmacist Pager: [phone]-4P [phone] [place] [phone] 11/01/2017 3:44 PM

## 2017-11-01 NOTE — Progress Notes (Signed)
Patient ID: Kristen Berry, female   DOB: March 04, 1956, 62 y.o.   MRN: [id number]  [facility] Progress Note   Assessment/ Plan:   1. Acute congestive heart failure exacerbation: Volume status better with ultrafiltration/hemodialysis, plans for coronary angiography noted. This will be key in facilitating additional vascular surgery procedures. 2. ESRD: following significant acute kidney injury on chronic kidney disease stage IV. Now on scheduled hemodialysis currently on a Monday/Wednesday/schedule an process initiated for patient dialysis replacement. Vascular surgery note reviewed with regards to dialysis access planning. 3. Anemia:hemoglobin level low but stable while on heparin drip, will increase Aranesp dose. Recent iron studies reviewed. 4. CKD-MBD: calcium/phosphorus level within acceptable range, continue calcium acetate with meals. PTH level at goal for chronic kidney disease stage 5/ESRD. 5. Left second toe gangrene/cellulitis:component of cellulitis appears to be improving and plans noted for arteriogram of left leg followed by amputation of toe at some point following evaluation of coronary anatomy. 6. Hypertension:blood pressure marginally elevated, review and hypertensive therapy following hemodialysis filtration tomorrow.  Subjective:   Reports to be feeling fair, somewhat apprehensive about upcoming procedures including amputation.   Objective:   BP (!) 145/56   Pulse 81   Temp 97.9 F (36.6 C) (Oral)   Resp 16   Ht 5\' 8"  (1.727 m)   Wt 102 kg (224 lb 13.9 oz)   SpO2 100%   BMI 34.19 kg/m   Physical Exam: HWE:[id number] resting in bed CVS: pulse regular rhythm, normal rate, S1 and S2 normal Resp:. Auscultation bilaterally, no distinct rales or rhonchi FYB:OFBP, obese, nontender ZWC:HENI ankle edema  Labs: BMET Recent Labs  Lab 10/25/17 1029 10/26/17 1200 10/26/17 2002 10/27/17 0322 10/28/17 0436 10/29/17 0414 10/31/17 0422 11/01/17 0451  NA  134* 134* 136 137 136 135  136 136 137  K 3.8 3.5 3.4* 3.3* 3.9 3.6  3.6 3.5 3.7  CL 94* 96* 98* 98* 99* 97*  98* 99* 97*  CO2 23 25 26 26 25 24  26 23 26   GLUCOSE 158* 143* 219* 117* 94 122*  134* 97 105*  BUN 58* 25* 13 18 19  31*  29* 27* 12  CREATININE 2.88* 2.10* 1.48* 1.90* 2.29* 3.01*  3.03* 2.55* 1.76*  CALCIUM 8.4* 8.2* 8.1* 8.0* 8.0* 7.9*  8.1* 8.2* 8.0*  PHOS 2.8 2.8 1.7* 2.2* 2.5 2.8  2.6 3.6  --    CBC Recent Labs  Lab 10/26/17 1200  10/29/17 0414 10/30/17 0209 10/31/17 0422 11/01/17 0451  WBC 21.5*   < > 14.8* 14.2* 12.8* 10.8*  NEUTROABS 18.4*  --   --   --   --   --   HGB 9.1*   < > 8.7* 8.4* 8.0* 8.0*  HCT 29.7*   < > 29.0* 27.3* 26.5* 27.3*  MCV 91.1   < > 92.1 91.3 92.0 93.2  PLT 462*   < > 408* 370 385 402*   < > = values in this interval not displayed.   Medications:    . aspirin  81 mg Oral Pre-Cath  . aspirin EC  81 mg Oral Daily  . calcium acetate  667 mg Oral TID WC  . carvedilol  12.5 mg Oral BID WC  . darbepoetin (ARANESP) injection - DIALYSIS  60 mcg Intravenous Q Thu-HD  . gabapentin  100 mg Oral Daily  . hydrALAZINE  100 mg Oral Q8H  . isosorbide mononitrate  120 mg Oral Daily  . polyethylene glycol  17 g Oral Daily  .  sodium chloride flush  3 mL Intravenous Q12H   Elmarie Shiley, MD 11/01/2017, 9:42 AM

## 2017-11-01 NOTE — Progress Notes (Signed)
Accepted at [place] at 06:35am

## 2017-11-01 NOTE — Progress Notes (Signed)
Patient taken to cath lab , family at bedside  aware of transfer.

## 2017-11-01 NOTE — Progress Notes (Addendum)
Patient not available/at procedure; plan to  Attempt to try back as schedule allows.  Deniece Ree PT, DPT, CBIS  Supplemental Physical Therapist [facility]   Pager [phone]

## 2017-11-01 NOTE — Progress Notes (Signed)
PROGRESS NOTE    Kristen Berry  EXB:[id number] DOB: January 15, 1956 DOA: 10/10/2017 PCP: Martinique, Sarah T, MD   Brief Narrative: 62 y.o. 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  Transferred to [facility] due to worsening renal failure, temporary dialysis catheter was placed on 10/19/2016, the patient started dialysis on 10/20/2016, orthopedic and vascular surgery was consulted due to persistent leukocytosis, cardiology was consulted and discussed with vascular he will performed coronary angiogram and selective left lower and right lower extremity angiogram for possible revascularization, then be evaluated by orthopedic surgery for possible left second toe amputation  Assessment & Plan:   #Acute kidney injury on chronic kidney disease stage IV now likely progressed to ESRD: Patient with fluid overload, not improved with diuretics.  Currently on hemodialysis.  Patient has dialysis catheter.  Nephrology consult appreciated.  - Evaluation for outpatient dialysis arrangement.  #Acute systolic congestive heart failure/cardiomyopathy: EF of 35-40%.   -Patient does not have chest pain or shortness of breath.  Patient is now on hemodialysis for ultrafiltration.  Plan for cardiac catheter today.  No chest pain or shortness of breath.  Cardiology consult appreciated. On aspirin, Coreg, hydralazine, Imdur  #Anemia of chronic kidney disease: Continue Aranesp and IV iron during the dialysis.  Monitor CBC  #Chest pain/shortness of breath likely due to fluid overload and pulmonary edema: Improved now  #Left second toe osteomyelitis/peripheral vascular disease: Orthopedics was consulted, as per prior  orthopedics note, the plan is to have Dr. Sharol Given to follow-up. -Plan for vascular procedure including angiogram, fistula creation post cardiac cath.  Discussed with the vascular surgeon yesterday.  #Right lower extremity DVT: On IV heparin.  Next long-term anticoagulation/oral after the procedure done.  #Essential hypertension: Monitor blood pressure.  Continue current medication.  Dialysis per nephrology.  #Physical deconditioning: PT OT evaluation.  DVT prophylaxis: heparin Code Status:full code Family Communication: Patient's friend at bedside Disposition Plan: Likely discharge home versus rehab in 5-6 days.    Consultants:   Cardiology  Vascular surgery  Orthopedics  Nephrology  Procedures: Multiple imaging studies Antimicrobials: Rocephin and daptomycin  Subjective: Seen and examined at dialysis unit.  No new event.  Plan for cardiac cath.  Denies chest pain or shortness of breath.   Objective: Vitals:   10/31/17 2108 11/01/17 0525 11/01/17 0830 11/01/17 1055  BP: (!) 120/44 (!) 97/47 (!) 145/56   Pulse: 80 72 81   Resp: 16 16    Temp: 98.4 F (36.9 C) 97.9 F (36.6 C)    TempSrc: Oral Oral    SpO2: 97% 100%  95%  Weight:  102 kg (224 lb 13.9 oz)    Height:        Intake/Output Summary (Last 24 hours) at 11/01/2017 1057 Last data filed at 11/01/2017 0839 Gross per 24 hour  Intake 123.6 ml  Output 2500 ml  Net -2376.4 ml   Filed Weights   10/31/17 0437 10/31/17 0800 11/01/17 0525  Weight: 100.8 kg (222 lb 3.6 oz) 101 kg (222 lb 10.6 oz) 102 kg (224 lb 13.9 oz)    Examination:  General exam: Noted distress Respiratory system: Clear bilateral, no wheezing. Cardiovascular system: Regular rate rhythm S1-S2 normal.  No pedal edema. Gastrointestinal system: Abdomen soft.  Bowel  sounds positive.  Nontender. Central nervous system: Alert and oriented. No focal neurological deficits. Extremities: Symmetric 5 x 5 power. Psychiatry: Judgement and insight appear  normal. Mood & affect appropriate.     Data Reviewed: I have personally reviewed following labs and imaging studies  CBC: Recent Labs  Lab 10/26/17 1200  10/27/17 1730 10/29/17 0414 10/30/17 0209 10/31/17 0422 11/01/17 0451  WBC 21.5*   < > 18.3* 14.8* 14.2* 12.8* 10.8*  NEUTROABS 18.4*  --   --   --   --   --   --   HGB 9.1*   < > 8.6* 8.7* 8.4* 8.0* 8.0*  HCT 29.7*   < > 28.6* 29.0* 27.3* 26.5* 27.3*  MCV 91.1   < > 91.1 92.1 91.3 92.0 93.2  PLT 462*   < > 445* 408* 370 385 402*   < > = values in this interval not displayed.   Basic Metabolic Panel: Recent Labs  Lab 10/26/17 2002 10/27/17 0322 10/28/17 0436 10/29/17 0414 10/31/17 0422 11/01/17 0451  NA 136 137 136 135  136 136 137  K 3.4* 3.3* 3.9 3.6  3.6 3.5 3.7  CL 98* 98* 99* 97*  98* 99* 97*  CO2 26 26 25 24  26 23 26   GLUCOSE 219* 117* 94 122*  134* 97 105*  BUN 13 18 19  31*  29* 27* 12  CREATININE 1.48* 1.90* 2.29* 3.01*  3.03* 2.55* 1.76*  CALCIUM 8.1* 8.0* 8.0* 7.9*  8.1* 8.2* 8.0*  PHOS 1.7* 2.2* 2.5 2.8  2.6 3.6  --    GFR: Estimated Creatinine Clearance: 41.4 mL/min (A) (by C-G formula based on SCr of 1.76 mg/dL (H)). Liver Function Tests: Recent Labs  Lab 10/26/17 2002 10/27/17 0322 10/28/17 0436 10/29/17 0414 10/31/17 0422  ALBUMIN 2.3* 2.3* 2.4* 2.3*  2.4* 2.3*   No results for input(s): LIPASE, AMYLASE in the last 168 hours. No results for input(s): AMMONIA in the last 168 hours. Coagulation Profile: Recent Labs  Lab 11/01/17 0451  INR 1.48   Cardiac Enzymes: Recent Labs  Lab 10/25/17 1601 10/25/17 2157 10/27/17 0322 10/28/17 0436 10/29/17 1100  CKTOTAL  --   --  12* 16*  --   TROPONINI 0.15* 0.15*  --   --  0.05*   BNP (last 3 results) No results for input(s): PROBNP in the last 8760 hours. HbA1C: No results for input(s): HGBA1C in the last 72 hours. CBG: No results for input(s): GLUCAP in the last 168 hours. Lipid Profile: No results for input(s): CHOL, HDL,  LDLCALC, TRIG, CHOLHDL, LDLDIRECT in the last 72 hours. Thyroid Function Tests: No results for input(s): TSH, T4TOTAL, FREET4, T3FREE, THYROIDAB in the last 72 hours. Anemia Panel: No results for input(s): VITAMINB12, FOLATE, FERRITIN, TIBC, IRON, RETICCTPCT in the last 72 hours. Sepsis Labs: Recent Labs  Lab 10/26/17 2002  PROCALCITON 1.25    Recent Results (from the past 240 hour(s))  Surgical PCR screen     Status: None   Collection Time: 11/01/17 12:38 AM  Result Value Ref Range Status   MRSA, PCR NEGATIVE NEGATIVE Final   Staphylococcus aureus NEGATIVE NEGATIVE Final    Comment: (NOTE) The Xpert SA Assay (FDA approved for NASAL specimens in patients 32 years of age and older), is one component of a comprehensive surveillance program. It is not intended to diagnose infection nor to guide or monitor treatment. Performed at [facility], [place] [address] [place], [place] [address]  Radiology Studies: No results found.      Scheduled Meds: . aspirin  81 mg Oral Pre-Cath  . [MAR Hold] aspirin EC  81 mg Oral Daily  . [MAR Hold] calcium acetate  667 mg Oral TID WC  . [MAR Hold] carvedilol  12.5 mg Oral BID WC  . [MAR Hold] darbepoetin (ARANESP) injection - DIALYSIS  100 mcg Intravenous Q Wed-HD  . [MAR Hold] gabapentin  100 mg Oral Daily  . [MAR Hold] hydrALAZINE  100 mg Oral Q8H  . [MAR Hold] isosorbide mononitrate  120 mg Oral Daily  . [MAR Hold] polyethylene glycol  17 g Oral Daily  . sodium chloride flush  3 mL Intravenous Q12H   Continuous Infusions: . sodium chloride    . sodium chloride 10 mL/hr at 11/01/17 0838  . [MAR Hold] cefTRIAXone (ROCEPHIN)  IV Stopped (10/31/17 2255)  . [MAR Hold] DAPTOmycin (CUBICIN)  IV 680 mg (10/31/17 1757)  . heparin 2,700 Units/hr (11/01/17 0912)     LOS: 22 days    Kristen Tanna Furry, MD [facility] Pager [phone]  If 7PM-7AM, please contact night-coverage [email or web address] Password  [facility] 11/01/2017, 10:57 AM

## 2017-11-02 ENCOUNTER — Encounter (HOSPITAL_COMMUNITY): Payer: Self-pay | Admitting: Interventional Cardiology

## 2017-11-02 ENCOUNTER — Inpatient Hospital Stay (HOSPITAL_COMMUNITY): Payer: Medicare HMO

## 2017-11-02 DIAGNOSIS — I739 Peripheral vascular disease, unspecified: Secondary | ICD-10-CM

## 2017-11-02 DIAGNOSIS — Z01810 Encounter for preprocedural cardiovascular examination: Secondary | ICD-10-CM

## 2017-11-02 DIAGNOSIS — I2583 Coronary atherosclerosis due to lipid rich plaque: Secondary | ICD-10-CM

## 2017-11-02 LAB — BASIC METABOLIC PANEL
Anion gap: 15 (ref 5–15)
BUN: 16 mg/dL (ref 6–20)
CO2: 23 mmol/L (ref 22–32)
Calcium: 8.1 mg/dL — ABNORMAL LOW (ref 8.9–10.3)
Chloride: 97 mmol/L — ABNORMAL LOW (ref 101–111)
Creatinine, Ser: 2.29 mg/dL — ABNORMAL HIGH (ref 0.44–1.00)
GFR calc Af Amer: 25 mL/min — ABNORMAL LOW (ref >60)
GFR calc non Af Amer: 22 mL/min — ABNORMAL LOW (ref >60)
Glucose, Bld: 116 mg/dL — ABNORMAL HIGH (ref 65–99)
Potassium: 4 mmol/L (ref 3.5–5.1)
Sodium: 135 mmol/L (ref 135–145)

## 2017-11-02 LAB — HEPARIN LEVEL (UNFRACTIONATED): Heparin Unfractionated: 0.38 IU/mL (ref 0.30–0.70)

## 2017-11-02 LAB — CBC
HCT: 27.2 % — ABNORMAL LOW (ref 36.0–46.0)
Hemoglobin: 8 g/dL — ABNORMAL LOW (ref 12.0–15.0)
MCH: 27.3 pg (ref 26.0–34.0)
MCHC: 29.4 g/dL — ABNORMAL LOW (ref 30.0–36.0)
MCV: 92.8 fL (ref 78.0–100.0)
Platelets: 422 10*3/uL — ABNORMAL HIGH (ref 150–400)
RBC: 2.93 MIL/uL — ABNORMAL LOW (ref 3.87–5.11)
RDW: 19.9 % — ABNORMAL HIGH (ref 11.5–15.5)
WBC: 11.7 10*3/uL — ABNORMAL HIGH (ref 4.0–10.5)

## 2017-11-02 MED ORDER — CARVEDILOL 25 MG PO TABS
25.0000 mg | ORAL_TABLET | Freq: Two times a day (BID) | ORAL | Status: DC
  Administered 2017-11-02 – 2017-11-15 (×17): 25 mg via ORAL
  Filled 2017-11-02 (×9): qty 1
  Filled 2017-11-02: qty 2
  Filled 2017-11-02 (×2): qty 1
  Filled 2017-11-02: qty 2
  Filled 2017-11-02 (×4): qty 1
  Filled 2017-11-02 (×2): qty 2
  Filled 2017-11-02 (×2): qty 1

## 2017-11-02 MED ORDER — DARBEPOETIN ALFA 100 MCG/0.5ML IJ SOSY
PREFILLED_SYRINGE | INTRAMUSCULAR | Status: AC
  Filled 2017-11-02: qty 0.5

## 2017-11-02 MED ORDER — SODIUM CHLORIDE 0.9 % IV SOLN
680.0000 mg | INTRAVENOUS | Status: DC
  Administered 2017-11-02 – 2017-11-04 (×2): 680 mg via INTRAVENOUS
  Filled 2017-11-02 (×2): qty 13.6

## 2017-11-02 NOTE — Procedures (Signed)
Patient seen on Hemodialysis. QB 400, UF goal 2.5L Treatment adjusted as needed.  Kristen Shiley MD [facility]. Office # [phone] Pager # [phone] 10:14 AM

## 2017-11-02 NOTE — Progress Notes (Signed)
Patient ID: Kristen Berry, female   DOB: 05-05-1956, 62 y.o.   MRN: [id number]  [facility] Progress Note   Assessment/ Plan:   1. Acute congestive heart failure exacerbation: Volume status better with ultrafiltration/hemodialysis, coronary angiogram showed diffuse disease with no PCI targets and no indications for CABG yet. Will continue to optimize volume status.  2. ESRD: following significant AKI on CKD IV. Now on scheduled hemodialysis currently on a Monday/Wednesday/schedule an process initiated for patient dialysis replacement. Awaiting permanent access placement following mapping done yesterday. Accepted to [facility] on TTS schedule.  3. Anemia:hemoglobin level low but stable, increased Aranesp dose. No overt loss.  4. CKD-MBD: calcium/phosphorus level within acceptable range, continue calcium acetate with meals. PTH level at goal for ESRD. 5. Left second toe gangrene/cellulitis:component of cellulitis appears to be improving and arteriogram of left leg showed multi-level disease that may affect level planned for amputation v/s revascularization attempt. 6. Hypertension:blood pressure marginally elevated, review following hemodialysis/UF today.  Subjective:   Reports to be feeling fair, some shortness of breath earlier today--better on HD.   Objective:   BP (!) 165/76   Pulse (!) 111   Temp (!) 97.5 F (36.4 C)   Resp 20   Ht 5\' 8"  (1.727 m)   Wt 99.7 kg (219 lb 12.8 oz)   SpO2 96%   BMI 33.42 kg/m   Physical Exam: WCB:[id number] resting in dialysis CVS: pulse regular tachycardia, S1 and S2 normal Resp:. Clear anteriorly, no distinct rales or rhonchi YWV:PXTG, obese, nontender Ext: trace ankle edema  Labs: BMET Recent Labs  Lab 10/26/17 1200 10/26/17 2002 10/27/17 0322 10/28/17 0436 10/29/17 0414 10/31/17 0422 11/01/17 0451 11/02/17 0412  NA 134* 136 137 136 135  136 136 137 135  K 3.5 3.4* 3.3* 3.9 3.6  3.6 3.5 3.7 4.0  CL 96*  98* 98* 99* 97*  98* 99* 97* 97*  CO2 25 26 26 25 24  26 23 26 23   GLUCOSE 143* 219* 117* 94 122*  134* 97 105* 116*  BUN 25* 13 18 19  31*  29* 27* 12 16  CREATININE 2.10* 1.48* 1.90* 2.29* 3.01*  3.03* 2.55* 1.76* 2.29*  CALCIUM 8.2* 8.1* 8.0* 8.0* 7.9*  8.1* 8.2* 8.0* 8.1*  PHOS 2.8 1.7* 2.2* 2.5 2.8  2.6 3.6  --   --    CBC Recent Labs  Lab 10/26/17 1200  10/30/17 0209 10/31/17 0422 11/01/17 0451 11/02/17 0412  WBC 21.5*   < > 14.2* 12.8* 10.8* 11.7*  NEUTROABS 18.4*  --   --   --   --   --   HGB 9.1*   < > 8.4* 8.0* 8.0* 8.0*  HCT 29.7*   < > 27.3* 26.5* 27.3* 27.2*  MCV 91.1   < > 91.3 92.0 93.2 92.8  PLT 462*   < > 370 385 402* 422*   < > = values in this interval not displayed.   Medications:    . aspirin  81 mg Oral Daily  . calcium acetate  667 mg Oral TID WC  . carvedilol  12.5 mg Oral BID WC  . Darbepoetin Alfa      . darbepoetin (ARANESP) injection - DIALYSIS  100 mcg Intravenous Q Wed-HD  . gabapentin  100 mg Oral Daily  . hydrALAZINE  100 mg Oral Q8H  . isosorbide mononitrate  120 mg Oral Daily  . polyethylene glycol  17 g Oral Daily   Elmarie Shiley, MD 11/02/2017, 10:15  AM

## 2017-11-02 NOTE — Progress Notes (Signed)
Bilateral Upper Extremity Vein Map  Right Cephalic Segment Diameter Depth Comment  Axilla 3.9 mm 20 mm   1. Prox Upper 2.5 mm 16 mm   2. Mid upper arm 2.5 mm 13 mm   3. Above AC 2.6 mm 4.8 mm   4. In AC 2.9 mm 3.0 mm   5. Below AC 3.2 mm 6.8 mm   6. Mid forearm 2.9 mm 8 mm   7. Distal FA 2.6 mm 9 mm    Wrist 1.3 mm 2.8 mm    Left Cephalic Segment Diameter Depth Comment  Axilla 4.7 mm 19 mm   1. Prox Upper 4.9 mm 18 mm   2. Mid upper arm 6.6 mm 13 mm branch  3. Above AC 5.6 mm 10 mm branch  4. In AC 5.4 mm 2.6 mm   5. Below AC 3.8 mm 8.6 mm branch  6. Mid forearm 4.3 mm 12 mm   7. Distal FA 3.7 mm 3 mm    Wrist 1.9 mm 4.1 mm    Lita Mains- RDMS, RVT 4:44 PM  11/02/2017

## 2017-11-02 NOTE — Progress Notes (Signed)
PT Cancellation Note  Patient Details Name: Kristen Berry MRN: [id number] DOB: May 27, 1956   Cancelled Treatment:    Reason Eval/Treat Not Completed: Patient at procedure or test/unavailable. Pt at HD.    [place] 11/02/2017, 10:00 AM [name]

## 2017-11-02 NOTE — Progress Notes (Signed)
PT Cancellation Note  Patient Details Name: LAISHA RAU MRN: [id number] DOB: 1956/04/28   Cancelled Treatment:    Reason Eval/Treat Not Completed: Patient at procedure or test/unavailable Per RN, pt going down for vascular lab testing. Will follow up as schedule allows.   Leighton Ruff, PT, DPT  Acute Rehabilitation Services  Pager: [phone]   Rudean Hitt 11/02/2017, 3:26 PM

## 2017-11-02 NOTE — Care Management Note (Addendum)
Case Management Note  Patient Details  Name: Kristen Berry MRN: [id number] Date of Birth: Oct 31, 1955  Subjective/Objective: s/p heart cath per cath report-There is no ideal targets for PCI.  The diffuse nature of disease would make coronary bypass surgery difficult -she has a necrotic toe which she needs to have an arteriogram and amputation,  procedures are all pending her complete coronary workup, Continue antibiotics and will hopefully be able get lower extremity angiogram performed by end of the week per Vascular Surgery. Will need HH services set up after surgery.  2/8 [place], BSN-Unsuccessfulleft lower extremity revascularization. Plan for left femoral to popliteal artery bypass next week. Also needs tunneled dialysis catheter and consideration of left arm fistula versus graft prior to discharge. Also found to have R lower extremity DVT >> on heparin.  NCM left [facility] agency list in room with patient to look over.                       Action/Plan: NCM will follow for dc needs.  Expected Discharge Date:  10/14/17               Expected Discharge Plan:  [place]  In-House Referral:  NA  Discharge planning Services  CM Consult  Post Acute Care Choice:  Home Health Choice offered to:  Patient  DME Arranged:    DME Agency:     HH Arranged:    [place] Agency:     Status of Service:  In process, will continue to follow  If discussed at Long Length of Stay Meetings, dates discussed:    Additional Comments:  Zenon Mayo, RN 11/02/2017, 1:11 PM

## 2017-11-02 NOTE — Progress Notes (Signed)
PROGRESS NOTE    Kristen Berry  VOZ:[id number] DOB: 1956-08-04 DOA: 10/10/2017 PCP: [place], Sarah T, MD   Brief Narrative: 62 y.o. 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  Transferred to [facility] due to worsening renal failure, temporary dialysis catheter was placed on 10/19/2016, the patient started dialysis on 10/20/2016, orthopedic and vascular surgery was consulted due to persistent leukocytosis, cardiology was consulted and discussed with vascular he will performed coronary angiogram and selective left lower and right lower extremity angiogram for possible revascularization, then be evaluated by orthopedic surgery for possible left second toe amputation  Assessment & Plan:   #Acute kidney injury on chronic kidney disease stage IV now progressed to ESRD: Patient with fluid overload, not improved with diuretics.  Currently on hemodialysis.  Patient has dialysis catheter.  Vein mapping and plan for vascular axis ongoing.  Vascular surgery and nephrology consult appreciated.   - For HD, patient is accepted at [facility] at 42:59DG  #Acute systolic congestive heart failure/cardiomyopathy: EF of 35-40%.   -s/p cath with multi-vessel disease with no target for PCI and no indication for CABG. No chest pain today.  -on ASA, coreg, statin, hydralazine, imdur -Ultrafiltration during dialysis.  -cardiology consult appreciated.   #Anemia of chronic kidney disease: Continue Aranesp and IV iron during the dialysis.  Monitor CBC  #Chest pain/shortness of breath likely due to fluid overload and pulmonary edema: Improved now  #Left  second toe osteomyelitis/peripheral vascular disease: Orthopedics was consulted, as per prior orthopedics note, the plan is to have Dr. Sharol Given to follow-up. -Plan for vascular procedure including angiogram, fistula creation post cardiac cath, likely tomorrow.  Discussed with the vascular surgeon.  #Right lower extremity DVT: On IV heparin.  Needs oral long-term anticoagulation/oral after the procedure done.  #Essential hypertension: Monitor blood pressure.  Continue current medication.  Dialysis per nephrology.  #Physical deconditioning: PT OT evaluation.  DVT prophylaxis: heparin Code Status:full code Family Communication: No family at bedside Disposition Plan: Likely discharge home versus rehab in 3-4 days.    Consultants:   Cardiology  Vascular surgery  Orthopedics  Nephrology  Procedures: Multiple imaging studies Antimicrobials: Rocephin and daptomycin  Subjective: Seen and examined at dialysis unit.  Status post cath yesterday.  Mild short of breath today.  Denied headache, dizziness, chest pain.  Going for dialysis today.  Objective: Vitals:   11/02/17 0930 11/02/17 1000 11/02/17 1030 11/02/17 1100  BP: (!) 141/67 (!) 165/76 (!) 149/8 (!) 159/71  Pulse: (!) 114 (!) 111 (!) 103 (!) 109  Resp: 20 20 20 20   Temp: (!) 97.5 F (36.4 C)     TempSrc:      SpO2: 96%     Weight: 99.7 kg (219 lb 12.8 oz)     Height:        Intake/Output Summary (Last 24 hours) at 11/02/2017 1135 Last data filed at 11/02/2017 0700 Gross per 24 hour  Intake 837.5 ml  Output -  Net 837.5 ml   Filed Weights   11/01/17 0525 11/02/17 0500 11/02/17 0930  Weight: 102 kg (224 lb 13.9 oz) 99.7 kg (219 lb 12.8 oz) 99.7 kg (219 lb 12.8 oz)    Examination:  General exam: Not in  distress Respiratory system: Bibasilar rhonchi, no wheezing. Cardiovascular system: Regular rate rhythm S1-S2 normal.  No pedal edema Gastrointestinal system: Abdomen soft, bowel sounds positive.  Nontender.. Central  nervous system: Alert and oriented. No focal neurological deficits. Extremities: Symmetric 5 x 5 power. Psychiatry: Judgement and insight appear normal. Mood & affect appropriate.     Data Reviewed: I have personally reviewed following labs and imaging studies  CBC: Recent Labs  Lab 10/26/17 1200  10/29/17 0414 10/30/17 0209 10/31/17 0422 11/01/17 0451 11/02/17 0412  WBC 21.5*   < > 14.8* 14.2* 12.8* 10.8* 11.7*  NEUTROABS 18.4*  --   --   --   --   --   --   HGB 9.1*   < > 8.7* 8.4* 8.0* 8.0* 8.0*  HCT 29.7*   < > 29.0* 27.3* 26.5* 27.3* 27.2*  MCV 91.1   < > 92.1 91.3 92.0 93.2 92.8  PLT 462*   < > 408* 370 385 402* 422*   < > = values in this interval not displayed.   Basic Metabolic Panel: Recent Labs  Lab 10/26/17 2002 10/27/17 0322 10/28/17 0436 10/29/17 0414 10/31/17 0422 11/01/17 0451 11/02/17 0412  NA 136 137 136 135  136 136 137 135  K 3.4* 3.3* 3.9 3.6  3.6 3.5 3.7 4.0  CL 98* 98* 99* 97*  98* 99* 97* 97*  CO2 26 26 25 24  26 23 26 23   GLUCOSE 219* 117* 94 122*  134* 97 105* 116*  BUN 13 18 19  31*  29* 27* 12 16  CREATININE 1.48* 1.90* 2.29* 3.01*  3.03* 2.55* 1.76* 2.29*  CALCIUM 8.1* 8.0* 8.0* 7.9*  8.1* 8.2* 8.0* 8.1*  PHOS 1.7* 2.2* 2.5 2.8  2.6 3.6  --   --    GFR: Estimated Creatinine Clearance: 31.4 mL/min (A) (by C-G formula based on SCr of 2.29 mg/dL (H)). Liver Function Tests: Recent Labs  Lab 10/26/17 2002 10/27/17 0322 10/28/17 0436 10/29/17 0414 10/31/17 0422  ALBUMIN 2.3* 2.3* 2.4* 2.3*  2.4* 2.3*   No results for input(s): LIPASE, AMYLASE in the last 168 hours. No results for input(s): AMMONIA in the last 168 hours. Coagulation Profile: Recent Labs  Lab 11/01/17 0451  INR 1.48   Cardiac Enzymes: Recent Labs  Lab 10/27/17 0322 10/28/17 0436 10/29/17 1100  CKTOTAL 12* 16*  --   TROPONINI  --   --  0.05*   BNP (last 3 results) No results for input(s): PROBNP in the last 8760 hours. HbA1C: No results for  input(s): HGBA1C in the last 72 hours. CBG: Recent Labs  Lab 11/01/17 1302  GLUCAP 121*   Lipid Profile: No results for input(s): CHOL, HDL, LDLCALC, TRIG, CHOLHDL, LDLDIRECT in the last 72 hours. Thyroid Function Tests: No results for input(s): TSH, T4TOTAL, FREET4, T3FREE, THYROIDAB in the last 72 hours. Anemia Panel: No results for input(s): VITAMINB12, FOLATE, FERRITIN, TIBC, IRON, RETICCTPCT in the last 72 hours. Sepsis Labs: Recent Labs  Lab 10/26/17 2002  PROCALCITON 1.25    Recent Results (from the past 240 hour(s))  Surgical PCR screen     Status: None   Collection Time: 11/01/17 12:38 AM  Result Value Ref Range Status   MRSA, PCR NEGATIVE NEGATIVE Final   Staphylococcus aureus NEGATIVE NEGATIVE Final    Comment: (NOTE) The Xpert SA Assay (FDA approved for NASAL specimens in patients 40 years of age and older), is one component of a comprehensive surveillance program. It is not intended to diagnose  infection nor to guide or monitor treatment. Performed at [facility], [place] [address] [place],  [address]          Radiology Studies: No results found.      Scheduled Meds: . aspirin  81 mg Oral Daily  . calcium acetate  667 mg Oral TID WC  . carvedilol  25 mg Oral BID WC  . Darbepoetin Alfa      . darbepoetin (ARANESP) injection - DIALYSIS  100 mcg Intravenous Q Wed-HD  . gabapentin  100 mg Oral Daily  . hydrALAZINE  100 mg Oral Q8H  . isosorbide mononitrate  120 mg Oral Daily  . polyethylene glycol  17 g Oral Daily   Continuous Infusions: . cefTRIAXone (ROCEPHIN)  IV Stopped (11/01/17 1814)  . DAPTOmycin (CUBICIN)  IV    . heparin 2,700 Units/hr (11/02/17 0418)     LOS: 23 days    Dron Tanna Furry, MD [facility] Pager [phone]  If 7PM-7AM, please contact night-coverage [email or web address] Password [email or web address] 11/02/2017, 11:35 AM

## 2017-11-02 NOTE — Progress Notes (Signed)
[place] for Heparin Indication: DVT  Allergies  Allergen Reactions  . Crestor [Rosuvastatin Calcium] Other (See Comments)    Leg pain   Patient Measurements: Height: 5\' 8"  (172.7 cm) Weight: 224 lb 13.9 oz (102 kg) IBW/kg (Calculated) : 63.9 Heparin Dosing Weight: 87.7 kg  Assessment: 63 yo F presented on 1/14 with worsening renal function. Treating left toe osteo.  Found to have DVT on doppler and started on IV heparin infusion. Previously had a difficult time getting heparin levels therapeutic and heparin drip rate was increased this AM to 2700 units/hr.  Hgb low but stable. Plt 402k S/p cardiac cath 11/01/17. Heparin was resume s/p sheath removal. Initial level is therapeutic  Goal of Therapy:  Heparin level 0.3-0.7 units/ml Monitor platelets by anticoagulation protocol: Yes   Plan:  -Continue current rate 2700 units/hr -Daily HL, CBC -Will check confirmatory HL -F/u plan for oral anticoagulation   Harvel Quale  11/02/2017 4:49 AM

## 2017-11-02 NOTE — Progress Notes (Signed)
Progress Note  Patient Name: ORIE CUTTINO Date of Encounter: 11/02/2017  Primary Cardiologist: Sinclair Grooms, MD   Subjective   No chest pain. She has intermittent dyspnea that exacerbated by movement. Can not lay flat due to dyspnea.   Inpatient Medications    Scheduled Meds: . aspirin  81 mg Oral Daily  . calcium acetate  667 mg Oral TID WC  . carvedilol  12.5 mg Oral BID WC  . darbepoetin (ARANESP) injection - DIALYSIS  100 mcg Intravenous Q Wed-HD  . gabapentin  100 mg Oral Daily  . hydrALAZINE  100 mg Oral Q8H  . isosorbide mononitrate  120 mg Oral Daily  . polyethylene glycol  17 g Oral Daily   Continuous Infusions: . cefTRIAXone (ROCEPHIN)  IV Stopped (11/01/17 1814)  . DAPTOmycin (CUBICIN)  IV    . heparin 2,700 Units/hr (11/02/17 0418)   PRN Meds: acetaminophen, camphor-menthol **AND** hydrOXYzine, docusate sodium, feeding supplement (NEPRO CARB STEADY), hydrALAZINE, ipratropium-albuterol, nitroGLYCERIN, ondansetron (ZOFRAN) IV, ondansetron **OR** [DISCONTINUED] ondansetron (ZOFRAN) IV, oxyCODONE, sodium chloride flush, sorbitol, zolpidem   Vital Signs    Vitals:   11/02/17 0600 11/02/17 0700 11/02/17 0925 11/02/17 0930  BP: (!) 148/71 (!) 136/55 (!) 148/69 (!) 141/67  Pulse: (!) 112 (!) 110 (!) 110 (!) 114  Resp: 16 18  20   Temp:  97.6 F (36.4 C)  (!) 97.5 F (36.4 C)  TempSrc:  Oral    SpO2: 97% 96%  96%  Weight:      Height:        Intake/Output Summary (Last 24 hours) at 11/02/2017 0956 Last data filed at 11/02/2017 0700 Gross per 24 hour  Intake 837.5 ml  Output -  Net 837.5 ml   Filed Weights   10/31/17 0800 11/01/17 0525 11/02/17 0500  Weight: 101 kg (222 lb 10.6 oz) 102 kg (224 lb 13.9 oz) 99.7 kg (219 lb 12.8 oz)    Telemetry    NSR - Personally Reviewed  ECG    N/A  Physical Exam   GEN: No acute distress.   Neck: No JVD Cardiac: RRR, no murmurs, rubs, or gallops. R radial cath site stable.  Respiratory: Clear to  auscultation bilaterally. GI: Soft, nontender, non-distended  MS: No edema; No deformity. RIJ catheter. Left foot dressing.  Neuro:  Nonfocal  Psych: Normal affect   Labs    Chemistry Recent Labs  Lab 10/28/17 0436 10/29/17 0414 10/31/17 0422 11/01/17 0451 11/02/17 0412  NA 136 135  136 136 137 135  K 3.9 3.6  3.6 3.5 3.7 4.0  CL 99* 97*  98* 99* 97* 97*  CO2 25 24  26 23 26 23   GLUCOSE 94 122*  134* 97 105* 116*  BUN 19 31*  29* 27* 12 16  CREATININE 2.29* 3.01*  3.03* 2.55* 1.76* 2.29*  CALCIUM 8.0* 7.9*  8.1* 8.2* 8.0* 8.1*  ALBUMIN 2.4* 2.3*  2.4* 2.3*  --   --   GFRNONAA 22* 16*  15* 19* 30* 22*  GFRAA 25* 18*  18* 22* 35* 25*  ANIONGAP 12 14  12 14 14 15      Hematology Recent Labs  Lab 10/31/17 0422 11/01/17 0451 11/02/17 0412  WBC 12.8* 10.8* 11.7*  RBC 2.88* 2.93* 2.93*  HGB 8.0* 8.0* 8.0*  HCT 26.5* 27.3* 27.2*  MCV 92.0 93.2 92.8  MCH 27.8 27.3 27.3  MCHC 30.2 29.3* 29.4*  RDW 19.9* 19.8* 19.9*  PLT 385 402* 422*  Cardiac Enzymes Recent Labs  Lab 10/29/17 1100  TROPONINI 0.05*   No results for input(s): TROPIPOC in the last 168 hours.   BNPNo results for input(s): BNP, PROBNP in the last 168 hours.   DDimer No results for input(s): DDIMER in the last 168 hours.   Radiology    No results found.  Cardiac Studies   TTE: 10/17/17  Study Conclusions  - Left ventricle: Septal and apical akinesis. The cavity size was moderately dilated. Wall thickness was increased in a pattern of mild LVH. Systolic function was moderately reduced. The estimated ejection fraction was in the range of 35% to 40%. Doppler parameters are consistent with both elevated ventricular end-diastolic filling pressure and elevated left atrial filling pressure. - Mitral valve: Moderately calcified, moderately thickened annulus. Moderately thickened leaflets . The findings are consistent with mild stenosis. There was moderate  regurgitation. Valve area by continuity equation (using LVOT flow): 1.5 cm^2. - Left atrium: The atrium was mildly dilated. - Atrial septum: No defect or patent foramen ovale was identified. - Pulmonary arteries: PA peak pressure: 55 mm Hg (S).   LEFT HEART CATH AND CORONARY ANGIOGRAPHY  Conclusion    Severe diffuse diabetic coronary disease.  Severe diffuse coronary calcification particularly in the left main and LAD territory.  40-50% distal left main.  Segmental diffuse ostial to proximal LAD 70-80% stenosis.  Unusual circumflex anatomy.  Diffuse distal disease.  No focal high-grade obstruction.  40% obstruction in the mid first obtuse marginal.  RCA is dominant.  The continuation of the RCA beyond the PDA contains 75% obstruction.  The mid PDA contains 70% obstruction proximal to bifurcation.  Distal first left ventricular branch contains 70% obstruction.  Chronic combined systolic and diastolic heart failure with EF in the 45-50% range.  Severe elevation in LVEDP at 30 mmHg.  RECOMMENDATIONS:   Multivessel diffuse coronary disease with regions of significant obstruction within diffuse pattern of atherosclerosis as noted above.  There is no high-grade obstruction with the appearance of instability.  She has borderline significant distal left main disease  Initial recommendation is aggressive medical therapy.  There is no ideal targets for PCI.  The diffuse nature of disease would make coronary bypass surgery difficult but I believe it is still possible.  I do not believe she is yet at a point where CABG should be recommended.  Will discuss with colleagues.   Diagnostic Diagram       Patient Profile     62 y.o. female with PMH of systolic HF, HTN, HL, CKD IV, osteomyelitis and PVD who presented with worsening renal function.Developed acute respiratory distress and acute renal failure.Echo 10/17/17 with LVEF=35-40%.  Started on dialysis this admission. Ortho and vascular  surgery was consulted due to persistent leukocytosis. Plan for lower extremity angiogram for possible revascularization and left 2nd toe amputation.   Also found to have R lower extremity >> on heparin.   Assessment & Plan    1. Acute on chronic systolic CHF - EF as declined and now 35-40% by echo . Given multiple comorbid issues felt not to be a cath candidate over the past few days and plan was to defer invasive coronary evaluation but now she is on HD permanently and needs to have an AVF placed.  As well as plan to toe amputation this admission.  - Given need to cardiac risk factors the patient under went cath as noted above.  - She has intermittent dyspnea. Volume managed by dialysis.  - Continue BB.  Not on ACE/ARB as on dialysis. Continue hydralazine and imdur.   2. CAD - Cath showed multivessel diffuse coronary disease. No clear target for PCI. ? CABG is options, however not in need currently. Dr. Tamala Julian to review films with other interventional team member.  - Continue ASA, imdur and BB.   3. HLD 10/18/2017: Cholesterol 184; HDL 39; LDL Cholesterol 109; Triglycerides 180; VLDL 36 - Allergic to Crestor. Consider adding another agent. LDL goal less than 70. If non tolerance, refer to lipid clinic as outpatient.   For questions or updates, please contact [place] Please consult [email or web address] for contact info under Cardiology/STEMI.      Jarrett Soho, PA  11/02/2017, 9:56 AM

## 2017-11-03 ENCOUNTER — Encounter (HOSPITAL_COMMUNITY): Payer: Self-pay | Admitting: Certified Registered Nurse Anesthetist

## 2017-11-03 ENCOUNTER — Inpatient Hospital Stay (HOSPITAL_COMMUNITY): Payer: Medicare HMO | Admitting: Certified Registered Nurse Anesthetist

## 2017-11-03 ENCOUNTER — Encounter (HOSPITAL_COMMUNITY)
Admission: AD | Disposition: A | Discharge status: Home Health | Payer: Self-pay | Source: Ambulatory Visit | Attending: Nephrology

## 2017-11-03 DIAGNOSIS — I509 Heart failure, unspecified: Secondary | ICD-10-CM

## 2017-11-03 DIAGNOSIS — I2583 Coronary atherosclerosis due to lipid rich plaque: Secondary | ICD-10-CM

## 2017-11-03 DIAGNOSIS — I2510 Atherosclerotic heart disease of native coronary artery without angina pectoris: Secondary | ICD-10-CM

## 2017-11-03 DIAGNOSIS — I428 Other cardiomyopathies: Secondary | ICD-10-CM

## 2017-11-03 HISTORY — PX: AORTOGRAM: SHX6300

## 2017-11-03 LAB — CBC
HCT: 26.8 % — ABNORMAL LOW (ref 36.0–46.0)
Hemoglobin: 8 g/dL — ABNORMAL LOW (ref 12.0–15.0)
MCH: 27.7 pg (ref 26.0–34.0)
MCHC: 29.9 g/dL — ABNORMAL LOW (ref 30.0–36.0)
MCV: 92.7 fL (ref 78.0–100.0)
Platelets: 415 10*3/uL — ABNORMAL HIGH (ref 150–400)
RBC: 2.89 MIL/uL — ABNORMAL LOW (ref 3.87–5.11)
RDW: 20 % — ABNORMAL HIGH (ref 11.5–15.5)
WBC: 10.3 10*3/uL (ref 4.0–10.5)

## 2017-11-03 LAB — CK: Total CK: 52 U/L (ref 38–234)

## 2017-11-03 LAB — GLUCOSE, CAPILLARY
Glucose-Capillary: 95 mg/dL (ref 65–99)
Glucose-Capillary: 119 mg/dL — ABNORMAL HIGH (ref 65–99)
Glucose-Capillary: 133 mg/dL — ABNORMAL HIGH (ref 65–99)

## 2017-11-03 LAB — TYPE AND SCREEN
ABO/RH(D): AB NEG
Antibody Screen: NEGATIVE

## 2017-11-03 LAB — HEPARIN LEVEL (UNFRACTIONATED): Heparin Unfractionated: 0.32 IU/mL (ref 0.30–0.70)

## 2017-11-03 LAB — ABO/RH: ABO/RH(D): AB NEG

## 2017-11-03 SURGERY — AORTOGRAM
Anesthesia: General

## 2017-11-03 MED ORDER — FENTANYL CITRATE (PF) 100 MCG/2ML IJ SOLN: 25.0000 ug | INTRAMUSCULAR | Status: DC | PRN

## 2017-11-03 MED ORDER — SODIUM CHLORIDE 0.9 % IV SOLN
INTRAVENOUS | Status: DC | PRN
  Administered 2017-11-03: 500 mL

## 2017-11-03 MED ORDER — ACETAMINOPHEN 325 MG PO TABS
650.0000 mg | ORAL_TABLET | ORAL | Status: DC | PRN
  Administered 2017-11-06: 650 mg via ORAL
  Filled 2017-11-03: qty 2

## 2017-11-03 MED ORDER — EPHEDRINE SULFATE 50 MG/ML IJ SOLN
INTRAMUSCULAR | Status: DC | PRN
  Administered 2017-11-03 (×2): 15 mg via INTRAVENOUS

## 2017-11-03 MED ORDER — PROPOFOL 10 MG/ML IV BOLUS
INTRAVENOUS | Status: DC | PRN
  Administered 2017-11-03: 40 mg via INTRAVENOUS
  Administered 2017-11-03: 100 mg via INTRAVENOUS

## 2017-11-03 MED ORDER — PROPOFOL 500 MG/50ML IV EMUL
INTRAVENOUS | Status: DC | PRN
  Administered 2017-11-03: 30 ug/kg/min via INTRAVENOUS

## 2017-11-03 MED ORDER — GLYCOPYRROLATE 0.2 MG/ML IJ SOLN
INTRAMUSCULAR | Status: DC | PRN
  Administered 2017-11-03: 0.1 mg via INTRAVENOUS
  Administered 2017-11-03: .3 mg via INTRAVENOUS
  Administered 2017-11-03: .2 mg via INTRAVENOUS

## 2017-11-03 MED ORDER — NEOSTIGMINE METHYLSULFATE 10 MG/10ML IV SOLN
INTRAVENOUS | Status: DC | PRN
  Administered 2017-11-03 (×5): 1 mg via INTRAVENOUS

## 2017-11-03 MED ORDER — SODIUM CHLORIDE 0.9% FLUSH: 3.0000 mL | INTRAVENOUS | Status: DC | PRN

## 2017-11-03 MED ORDER — MIDAZOLAM HCL 5 MG/5ML IJ SOLN
INTRAMUSCULAR | Status: DC | PRN
  Administered 2017-11-03: 1 mg via INTRAVENOUS

## 2017-11-03 MED ORDER — PHENYLEPHRINE HCL 10 MG/ML IJ SOLN
INTRAVENOUS | Status: DC | PRN
  Administered 2017-11-03: 20 ug/min via INTRAVENOUS

## 2017-11-03 MED ORDER — ONDANSETRON HCL 4 MG/2ML IJ SOLN
INTRAMUSCULAR | Status: AC
  Filled 2017-11-03: qty 2

## 2017-11-03 MED ORDER — ROCURONIUM BROMIDE 10 MG/ML (PF) SYRINGE
PREFILLED_SYRINGE | INTRAVENOUS | Status: AC
  Filled 2017-11-03: qty 15

## 2017-11-03 MED ORDER — LIDOCAINE 2% (20 MG/ML) 5 ML SYRINGE
INTRAMUSCULAR | Status: AC
  Filled 2017-11-03: qty 15

## 2017-11-03 MED ORDER — FENTANYL CITRATE (PF) 100 MCG/2ML IJ SOLN
INTRAMUSCULAR | Status: DC | PRN
  Administered 2017-11-03: 100 ug via INTRAVENOUS

## 2017-11-03 MED ORDER — 0.9 % SODIUM CHLORIDE (POUR BTL) OPTIME
TOPICAL | Status: DC | PRN
  Administered 2017-11-03: 1000 mL

## 2017-11-03 MED ORDER — SUGAMMADEX SODIUM 200 MG/2ML IV SOLN
INTRAVENOUS | Status: AC
  Filled 2017-11-03: qty 2

## 2017-11-03 MED ORDER — DEXAMETHASONE SODIUM PHOSPHATE 10 MG/ML IJ SOLN
INTRAMUSCULAR | Status: DC | PRN
  Administered 2017-11-03: 5 mg via INTRAVENOUS

## 2017-11-03 MED ORDER — PHENYLEPHRINE 40 MCG/ML (10ML) SYRINGE FOR IV PUSH (FOR BLOOD PRESSURE SUPPORT)
PREFILLED_SYRINGE | INTRAVENOUS | Status: AC
  Filled 2017-11-03: qty 40

## 2017-11-03 MED ORDER — HEPARIN SODIUM (PORCINE) 1000 UNIT/ML IJ SOLN
INTRAMUSCULAR | Status: DC | PRN
  Administered 2017-11-03: 10000 [IU] via INTRAVENOUS

## 2017-11-03 MED ORDER — LABETALOL HCL 5 MG/ML IV SOLN: 10.0000 mg | INTRAVENOUS | Status: DC | PRN

## 2017-11-03 MED ORDER — SODIUM CHLORIDE 0.9 % IV SOLN
250.0000 mL | INTRAVENOUS | Status: DC | PRN
  Administered 2017-11-10 (×2): via INTRAVENOUS

## 2017-11-03 MED ORDER — SODIUM CHLORIDE 0.9 % IJ SOLN
INTRAVENOUS | Status: DC | PRN
  Administered 2017-11-03: 150 mL via INTRAMUSCULAR
  Administered 2017-11-03: 25 mL via INTRAMUSCULAR

## 2017-11-03 MED ORDER — SUGAMMADEX SODIUM 500 MG/5ML IV SOLN
INTRAVENOUS | Status: AC
  Filled 2017-11-03: qty 10

## 2017-11-03 MED ORDER — FENTANYL CITRATE (PF) 250 MCG/5ML IJ SOLN
INTRAMUSCULAR | Status: AC
  Filled 2017-11-03: qty 5

## 2017-11-03 MED ORDER — ONDANSETRON HCL 4 MG/2ML IJ SOLN: 4.0000 mg | Freq: Four times a day (QID) | INTRAMUSCULAR | Status: DC | PRN

## 2017-11-03 MED ORDER — PHENYLEPHRINE HCL 10 MG/ML IJ SOLN
INTRAMUSCULAR | Status: DC | PRN
  Administered 2017-11-03 (×2): 120 ug via INTRAVENOUS
  Administered 2017-11-03: 40 ug via INTRAVENOUS

## 2017-11-03 MED ORDER — SODIUM CHLORIDE 0.9 % IV SOLN
INTRAVENOUS | Status: DC
  Administered 2017-11-03: 17:00:00 via INTRAVENOUS

## 2017-11-03 MED ORDER — HEPARIN (PORCINE) IN NACL 100-0.45 UNIT/ML-% IJ SOLN
2300.0000 [IU]/h | INTRAMUSCULAR | Status: DC
  Administered 2017-11-04 – 2017-11-05 (×3): 2800 [IU]/h via INTRAVENOUS
  Administered 2017-11-05 (×2): 2400 [IU]/h via INTRAVENOUS
  Administered 2017-11-06 – 2017-11-07 (×3): 2300 [IU]/h via INTRAVENOUS
  Filled 2017-11-03 (×10): qty 250

## 2017-11-03 MED ORDER — LIDOCAINE HCL (CARDIAC) 20 MG/ML IV SOLN
INTRAVENOUS | Status: DC | PRN
  Administered 2017-11-03: 80 mg via INTRAVENOUS

## 2017-11-03 MED ORDER — SODIUM CHLORIDE 0.9% FLUSH
3.0000 mL | Freq: Two times a day (BID) | INTRAVENOUS | Status: DC
  Administered 2017-11-04 – 2017-11-13 (×9): 3 mL via INTRAVENOUS

## 2017-11-03 MED ORDER — ROCURONIUM BROMIDE 100 MG/10ML IV SOLN
INTRAVENOUS | Status: DC | PRN
  Administered 2017-11-03: 30 mg via INTRAVENOUS
  Administered 2017-11-03: 10 mg via INTRAVENOUS
  Administered 2017-11-03: 20 mg via INTRAVENOUS

## 2017-11-03 MED ORDER — OXYCODONE HCL 5 MG PO TABS: 5.0000 mg | ORAL_TABLET | Freq: Once | ORAL | Status: DC | PRN

## 2017-11-03 MED ORDER — OXYCODONE HCL 5 MG PO TABS
5.0000 mg | ORAL_TABLET | ORAL | Status: DC | PRN
  Administered 2017-11-05: 5 mg via ORAL
  Filled 2017-11-03: qty 1

## 2017-11-03 MED ORDER — MIDAZOLAM HCL 2 MG/2ML IJ SOLN
INTRAMUSCULAR | Status: AC
  Filled 2017-11-03: qty 2

## 2017-11-03 MED ORDER — ONDANSETRON HCL 4 MG/2ML IJ SOLN: 4.0000 mg | Freq: Once | INTRAMUSCULAR | Status: DC | PRN

## 2017-11-03 MED ORDER — NEOSTIGMINE METHYLSULFATE 5 MG/5ML IV SOSY
PREFILLED_SYRINGE | INTRAVENOUS | Status: AC
  Filled 2017-11-03: qty 5

## 2017-11-03 MED ORDER — DEXAMETHASONE SODIUM PHOSPHATE 10 MG/ML IJ SOLN
INTRAMUSCULAR | Status: AC
  Filled 2017-11-03: qty 1

## 2017-11-03 MED ORDER — PRAVASTATIN SODIUM 40 MG PO TABS: 40.0000 mg | ORAL_TABLET | Freq: Every day | ORAL | Status: DC

## 2017-11-03 MED ORDER — OXYCODONE HCL 5 MG/5ML PO SOLN: 5.0000 mg | Freq: Once | ORAL | Status: DC | PRN

## 2017-11-03 MED ORDER — ONDANSETRON HCL 4 MG/2ML IJ SOLN
INTRAMUSCULAR | Status: DC | PRN
  Administered 2017-11-03: 4 mg via INTRAVENOUS

## 2017-11-03 MED ORDER — HYDRALAZINE HCL 20 MG/ML IJ SOLN: 5.0000 mg | INTRAMUSCULAR | Status: DC | PRN

## 2017-11-03 MED ORDER — EPHEDRINE 5 MG/ML INJ
INTRAVENOUS | Status: AC
  Filled 2017-11-03: qty 10

## 2017-11-03 SURGICAL SUPPLY — 76 items
BAG SNAP BAND KOVER 36X36 (MISCELLANEOUS) ×3 IMPLANT
BALLN LUTONIX AV 6X100X75 (BALLOONS) ×3
BALLOON LUTONIX AV 6X100X75 (BALLOONS) ×2 IMPLANT
BANDAGE ACE 4X5 VEL STRL LF (GAUZE/BANDAGES/DRESSINGS) ×3 IMPLANT
BENZOIN TINCTURE PRP APPL 2/3 (GAUZE/BANDAGES/DRESSINGS) ×3 IMPLANT
BLADE AVERAGE 25X9 (BLADE) IMPLANT
BLADE SAW SGTL 81X20 HD (BLADE) IMPLANT
BLADE SURG 11 STRL SS (BLADE) ×3 IMPLANT
BLADE SURG 15 STRL LF DISP TIS (BLADE) ×2 IMPLANT
BLADE SURG 15 STRL SS (BLADE) ×1
BNDG GAUZE ELAST 4 BULKY (GAUZE/BANDAGES/DRESSINGS) ×3 IMPLANT
CANISTER SUCT 3000ML PPV (MISCELLANEOUS) ×3 IMPLANT
CATH ANGIO 5F BER 65CM (CATHETERS) IMPLANT
CATH CROSS OVER TEMPO 5F (CATHETERS) IMPLANT
CATH NAVICROSS ANGLED 90CM (MICROCATHETER) ×3 IMPLANT
CATH OMNI FLUSH .035X70CM (CATHETERS) ×3 IMPLANT
CATH QUICKCROSS .035X135CM (MICROCATHETER) ×3 IMPLANT
CATH QUICKCROSS SUPP .035X90CM (MICROCATHETER) IMPLANT
COVER BACK TABLE 80X110 HD (DRAPES) ×6 IMPLANT
COVER DOME SNAP 22 D (MISCELLANEOUS) ×3 IMPLANT
COVER PROBE W GEL 5X96 (DRAPES) ×3 IMPLANT
COVER SURGICAL LIGHT HANDLE (MISCELLANEOUS) ×3 IMPLANT
DERMABOND ADVANCED (GAUZE/BANDAGES/DRESSINGS) ×1
DERMABOND ADVANCED .7 DNX12 (GAUZE/BANDAGES/DRESSINGS) ×2 IMPLANT
DEVICE CLOSURE PERCLS PRGLD 6F (VASCULAR PRODUCTS) ×2 IMPLANT
DEVICE TORQUE KENDALL .025-038 (MISCELLANEOUS) ×3 IMPLANT
DRAPE EXTREMITY T 121X128X90 (DRAPE) ×3 IMPLANT
DRAPE FEMORAL ANGIO 80X135IN (DRAPES) ×3 IMPLANT
DRAPE HALF SHEET 40X57 (DRAPES) ×3 IMPLANT
ELECT REM PT RETURN 9FT ADLT (ELECTROSURGICAL) ×3
ELECTRODE REM PT RTRN 9FT ADLT (ELECTROSURGICAL) ×2 IMPLANT
GAUZE SPONGE 4X4 12PLY STRL (GAUZE/BANDAGES/DRESSINGS) ×3 IMPLANT
GAUZE SPONGE 4X4 12PLY STRL LF (GAUZE/BANDAGES/DRESSINGS) ×3 IMPLANT
GAUZE SPONGE 4X4 16PLY XRAY LF (GAUZE/BANDAGES/DRESSINGS) ×3 IMPLANT
GLOVE BIO SURGEON STRL SZ 6.5 (GLOVE) ×3 IMPLANT
GLOVE BIO SURGEON STRL SZ7.5 (GLOVE) ×3 IMPLANT
GLOVE BIOGEL PI IND STRL 6.5 (GLOVE) ×2 IMPLANT
GLOVE BIOGEL PI IND STRL 7.0 (GLOVE) ×2 IMPLANT
GLOVE BIOGEL PI INDICATOR 6.5 (GLOVE) ×1
GLOVE BIOGEL PI INDICATOR 7.0 (GLOVE) ×1
GOWN STRL REUS W/ TWL LRG LVL3 (GOWN DISPOSABLE) ×6 IMPLANT
GOWN STRL REUS W/ TWL XL LVL3 (GOWN DISPOSABLE) ×2 IMPLANT
GOWN STRL REUS W/TWL LRG LVL3 (GOWN DISPOSABLE) ×3
GOWN STRL REUS W/TWL XL LVL3 (GOWN DISPOSABLE) ×1
GUIDEWIRE ANGLED .035X150CM (WIRE) IMPLANT
GUIDEWIRE ANGLED .035X260CM (WIRE) ×3 IMPLANT
KIT BASIN OR (CUSTOM PROCEDURE TRAY) ×3 IMPLANT
KIT ROOM TURNOVER OR (KITS) ×3 IMPLANT
NEEDLE HYPO 25GX1X1/2 BEV (NEEDLE) IMPLANT
NEEDLE PERC 18GX7CM (NEEDLE) ×3 IMPLANT
NS IRRIG 1000ML POUR BTL (IV SOLUTION) ×3 IMPLANT
PACK GENERAL/GYN (CUSTOM PROCEDURE TRAY) ×3 IMPLANT
PACK PERIPHERAL VASCULAR (CUSTOM PROCEDURE TRAY) IMPLANT
PAD ARMBOARD 7.5X6 YLW CONV (MISCELLANEOUS) ×6 IMPLANT
PERCLOSE PROGLIDE 6F (VASCULAR PRODUCTS) ×3
PROTECTION STATION PRESSURIZED (MISCELLANEOUS) ×3
SET MICROPUNCTURE 5F STIFF (MISCELLANEOUS) ×6 IMPLANT
SHEATH AVANTI 11CM 5FR (MISCELLANEOUS) IMPLANT
SHEATH HIGHFLEX ANSEL 6FRX55 (SHEATH) ×3 IMPLANT
SHEATH PINNACLE 5F 10CM (SHEATH) ×3 IMPLANT
SHEATH PINNACLE ST 6F 45CM (SHEATH) ×3 IMPLANT
STATION PROTECTION PRESSURIZED (MISCELLANEOUS) ×2 IMPLANT
STOPCOCK MORSE 400PSI 3WAY (MISCELLANEOUS) ×3 IMPLANT
SUT ETHILON 3 0 PS 1 (SUTURE) ×3 IMPLANT
SUT SILK 2 0 SH (SUTURE) IMPLANT
SYR 10ML LL (SYRINGE) ×9 IMPLANT
SYR 20CC LL (SYRINGE) ×3 IMPLANT
SYR 30ML LL (SYRINGE) ×3 IMPLANT
SYR CONTROL 10ML LL (SYRINGE) IMPLANT
SYR MEDRAD MARK V 150ML (SYRINGE) ×3 IMPLANT
TOWEL GREEN STERILE (TOWEL DISPOSABLE) ×3 IMPLANT
TUBING HIGH PRESSURE 120CM (CONNECTOR) ×3 IMPLANT
UNDERPAD 30X30 (UNDERPADS AND DIAPERS) ×3 IMPLANT
WATER STERILE IRR 1000ML POUR (IV SOLUTION) ×3 IMPLANT
WIRE BENTSON .035X145CM (WIRE) ×6 IMPLANT
WIRE ROSEN-J .035X260CM (WIRE) ×6 IMPLANT

## 2017-11-03 NOTE — Progress Notes (Signed)
  Progress Note    11/03/2017 8:21 AM 2 Days Post-Op  Subjective: doing well today  Vitals:   11/02/17 2130 11/03/17 0431  BP: (!) 115/44 (!) 151/48  Pulse:  84  Resp: (!) 21 (!) 22  Temp:  97.8 F (36.6 C)  SpO2:  97%    Physical Exam: aaox3 Non labored respirations Left foot dressing cdi  CBC    Component Value Date/Time   WBC 10.3 11/03/2017 0345   RBC 2.89 (L) 11/03/2017 0345   HGB 8.0 (L) 11/03/2017 0345   HGB 14.3 12/14/2016 1139   HCT 26.8 (L) 11/03/2017 0345   HCT 44.2 12/14/2016 1139   PLT 415 (H) 11/03/2017 0345   PLT 429 (H) 12/14/2016 1139   MCV 92.7 11/03/2017 0345   MCV 88.5 12/14/2016 1139   MCH 27.7 11/03/2017 0345   MCHC 29.9 (L) 11/03/2017 0345   RDW 20.0 (H) 11/03/2017 0345   RDW 17.6 (H) 12/14/2016 1139   LYMPHSABS 0.8 10/26/2017 1200   LYMPHSABS 1.9 12/14/2016 1139   MONOABS 1.9 (H) 10/26/2017 1200   MONOABS 0.7 12/14/2016 1139   EOSABS 0.3 10/26/2017 1200   EOSABS 0.0 10/17/2017 1430   BASOSABS 0.1 10/26/2017 1200   BASOSABS 0.1 12/14/2016 1139    BMET    Component Value Date/Time   NA 135 11/02/2017 0412   NA 137 12/14/2016 1139   K 4.0 11/02/2017 0412   K 4.2 12/14/2016 1139   CL 97 (L) 11/02/2017 0412   CO2 23 11/02/2017 0412   CO2 20 (L) 12/14/2016 1139   GLUCOSE 116 (H) 11/02/2017 0412   GLUCOSE 115 12/14/2016 1139   BUN 16 11/02/2017 0412   BUN 32.8 (H) 12/14/2016 1139   CREATININE 2.29 (H) 11/02/2017 0412   CREATININE 2.1 (H) 12/14/2016 1139   CALCIUM 8.1 (L) 11/02/2017 0412   CALCIUM 9.5 12/14/2016 1139   GFRNONAA 22 (L) 11/02/2017 0412   GFRAA 25 (L) 11/02/2017 0412    INR    Component Value Date/Time   INR 1.48 11/01/2017 0451     Intake/Output Summary (Last 24 hours) at 11/03/2017 3295 Last data filed at 11/03/2017 0500 Gross per 24 hour  Intake 290 ml  Output 2500 ml  Net -2210 ml     Assessment:  62 y.o. female is s/p cardiac cath, has necrotic left 2nd toe with decreased ABI and now is  esrd  Plan: Aortogram with left 2nd toe amputation today Will plan tdc and left upper arm avf vs avg next week prior to dc Npo now   [address] C. Donzetta Matters, MD [facility] Office: [phone] Pager: [phone]  11/03/2017 8:21 AM

## 2017-11-03 NOTE — Progress Notes (Addendum)
[place] for Heparin Indication: DVT  Allergies  Allergen Reactions  . Crestor [Rosuvastatin Calcium] Other (See Comments)    Leg pain   Patient Measurements: Height: 5\' 8"  (172.7 cm) Weight: 211 lb 10.3 oz (96 kg) IBW/kg (Calculated) : 63.9 Heparin Dosing Weight: 87.7 kg  Assessment: 62 yo F presented on 1/14 with worsening renal function. Treating left toe osteo.  Found to have DVT on doppler and continues on heparin awaiting completion of procedures  Goal of Therapy:  Heparin level 0.3-0.7 units/ml Monitor platelets by anticoagulation protocol: Yes   Plan:  Heparin to 2800 units / hr Daily heparin level, CBC F/u plan for oral anticoagulation  Thank you Anette Guarneri, PharmD [phone]  11/03/2017 9:46 AM

## 2017-11-03 NOTE — Progress Notes (Signed)
  Patient responding to commands and pulling tidal volume of 824ml-1000ml. Dr Ermalene Postin notified and good with extubation. CRNA Almyra Free at bedside, patient extubated- suctioned and placed on 3L Nasal Cannula. 02 sat 93%.

## 2017-11-03 NOTE — Progress Notes (Signed)
Physical Therapy Treatment Patient Details Name: Kristen Berry MRN: [id number] DOB: May 13, 1956 Today's Date: 11/03/2017    History of Present Illness Patient is a 62 year old female who presented to the hospital with shortness of breath. She was found to have an acute kidney injury. PMH: PVD, HTN, CHF CKD, OA     PT Comments    Pt needed encouragement and support throughout treatment for confidence.  She is improving slowly, but needs to ambulate more.  Pt is dyspneic with minimal exertion, but did maintain sats at 90% on 2 L O2.  Emphasis mainly on gait stability and stamina.    Follow Up Recommendations  Home health PT     Equipment Recommendations  Rolling walker with 5" wheels    Recommendations for Other Services       Precautions / Restrictions Precautions Precautions: Fall    Mobility  Bed Mobility Overal bed mobility: Needs Assistance Bed Mobility: Supine to Sit     Supine to sit: Modified independent (Device/Increase time)        Transfers Overall transfer level: Needs assistance Equipment used: Rolling walker (2 wheeled) Transfers: Sit to/from Stand Sit to Stand: Min assist         General transfer comment: cues for hand placement, Assist to help pt come forward.  Ambulation/Gait Ambulation/Gait assistance: Min guard Ambulation Distance (Feet): 40 Feet(x2) Assistive device: Rolling walker (2 wheeled) Gait Pattern/deviations: Step-through pattern Gait velocity: Decreased Gait velocity interpretation: Below normal speed for age/gender General Gait Details: cues for getting closer to the RW and postural checks   Stairs            Wheelchair Mobility    Modified Rankin (Stroke Patients Only)       Balance Overall balance assessment: Needs assistance   Sitting balance-Leahy Scale: Good     Standing balance support: Bilateral upper extremity supported;During functional activity;No upper extremity supported Standing balance-Leahy  Scale: Fair Standing balance comment: statically steady without support                            Cognition Arousal/Alertness: Awake/alert Behavior During Therapy: WFL for tasks assessed/performed Overall Cognitive Status: Within Functional Limits for tasks assessed                                        Exercises      General Comments General comments (skin integrity, edema, etc.): During gait on 2L [place], pt maintained sats at 90%  with EHR at 92%.  On return to room and at rest, pts sats rose to 95%m and HR down to 87bpm      Pertinent Vitals/Pain Pain Assessment: No/denies pain    Home Living                      Prior Function            PT Goals (current goals can now be found in the care plan section) Acute Rehab PT Goals Patient Stated Goal: Feel stronger  PT Goal Formulation: With patient Time For Goal Achievement: 11/07/17 Potential to Achieve Goals: Good Progress towards PT goals: Progressing toward goals    Frequency    Min 3X/week      PT Plan Current plan remains appropriate    Co-evaluation  AM-PAC PT "6 Clicks" Daily Activity  Outcome Measure  Difficulty turning over in bed (including adjusting bedclothes, sheets and blankets)?: None Difficulty moving from lying on back to sitting on the side of the bed? : None Difficulty sitting down on and standing up from a chair with arms (e.g., wheelchair, bedside commode, etc,.)?: A Little Help needed moving to and from a bed to chair (including a wheelchair)?: A Little Help needed walking in hospital room?: A Little Help needed climbing 3-5 steps with a railing? : A Little 6 Click Score: 20    End of Session Equipment Utilized During Treatment: Oxygen Activity Tolerance: Patient limited by fatigue Patient left: with call bell/phone within reach;in chair;with chair alarm set Nurse Communication: Mobility status PT Visit Diagnosis: Difficulty in  walking, not elsewhere classified (R26.2);Other abnormalities of gait and mobility (R26.89)     Time: 1018-1040 PT Time Calculation (min) (ACUTE ONLY): 22 min  Charges:  $Gait Training: 8-22 mins                    G Codes:       Nov 13, 2017  Kristen Berry, PT [phone]  (pager)   Kristen Berry 11-13-17, 11:34 AM

## 2017-11-03 NOTE — Progress Notes (Signed)
PROGRESS NOTE    Kristen Berry  HUD:[id number] DOB: 22-Jan-1956 DOA: 10/10/2017 PCP: [place], Sarah T, MD   Brief Narrative: 62 y.o. 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  Currently with ultrafiltration volume status is acceptable.  Monitor electrolytes, blood pressure.  Patient has dialysis catheter.  Plan for AV fistula creation likely early next week per vascular surgery.  Consult appreciated.  - For HD, patient is accepted at [facility] at 85:88FO  #Acute systolic congestive heart failure/cardiomyopathy: EF of 35-40%.   -s/p cath with multi-vessel disease with no target for PCI and no indication for CABG. No chest pain today.  -on ASA, coreg, statin, hydralazine, imdur -Ultrafiltration during dialysis.  -cardiology consult appreciated.  -Statin after completion of daptomycin.  #Anemia of chronic kidney disease: Continue Aranesp and IV iron during the  dialysis.  Monitor CBC  #Chest pain/shortness of breath likely due to fluid overload and pulmonary edema: Improved now  #Left second toe osteomyelitis/peripheral vascular disease:  -Plan for aortogram with left second toe amputation today per vascular surgery.  #Right lower extremity DVT: On IV heparin.  Needs oral long-term anticoagulation/oral after the procedure done.  #Essential hypertension: Monitor blood pressure.  Continue current medication.  Dialysis per nephrology.  #Physical deconditioning: PT OT evaluation.  DVT prophylaxis: heparin Code Status:full code Family Communication: No family at bedside Disposition Plan: Likely discharge home  in 3-4 days.    Consultants:   Cardiology  Vascular surgery  Orthopedics  Nephrology  Procedures: Multiple imaging studies Antimicrobials: Rocephin and daptomycin  Subjective: Seen and examined at bedside.  Denies headache, dizziness, nausea vomiting chest pain shortness of breath.  Anxious about the procedure today.  Objective: Vitals:   11/02/17 2130 11/03/17 0431 11/03/17 0700 11/03/17 1100  BP: (!) 115/44 (!) 151/48 (!) 150/50 109/61  Pulse:  84 82 76  Resp: (!) 21 (!) 22 (!) 27 (!) 26  Temp:  97.8 F (36.6 C) 97.9 F (36.6 C) 97.9 F (36.6 C)  TempSrc:  Oral Oral Oral  SpO2:  97% 98% 100%  Weight:  96 kg (211 lb 10.3 oz)    Height:        Intake/Output Summary (Last 24 hours) at 11/03/2017 1316 Last data filed at 11/03/2017 0700 Gross per 24 hour  Intake 290 ml  Output 2500 ml  Net -2210 ml   Filed Weights   11/02/17 0930 11/02/17 1330 11/03/17 0431  Weight: 99.7 kg (219 lb 12.8 oz) 97.2 kg (214 lb 4.6 oz) 96 kg (211 lb 10.3 oz)    Examination:  General exam: Not in distress Respiratory system: Clear bilateral, no wheezing. Cardiovascular  system: Regular rate rhythm S1-S2 normal.  No pedal edema. Gastrointestinal system: Abdomen soft, nontender.  Bowel sounds positive.. Central nervous system: Alert  and oriented. No focal neurological deficits. Extremities: Symmetric 5 x 5 power. Psychiatry: Judgement and insight appear normal. Mood & affect appropriate.     Data Reviewed: I have personally reviewed following labs and imaging studies  CBC: Recent Labs  Lab 10/30/17 0209 10/31/17 0422 11/01/17 0451 11/02/17 0412 11/03/17 0345  WBC 14.2* 12.8* 10.8* 11.7* 10.3  HGB 8.4* 8.0* 8.0* 8.0* 8.0*  HCT 27.3* 26.5* 27.3* 27.2* 26.8*  MCV 91.3 92.0 93.2 92.8 92.7  PLT 370 385 402* 422* 440*   Basic Metabolic Panel: Recent Labs  Lab 10/28/17 0436 10/29/17 0414 10/31/17 0422 11/01/17 0451 11/02/17 0412  NA 136 135  136 136 137 135  K 3.9 3.6  3.6 3.5 3.7 4.0  CL 99* 97*  98* 99* 97* 97*  CO2 25 24  26 23 26 23   GLUCOSE 94 122*  134* 97 105* 116*  BUN 19 31*  29* 27* 12 16  CREATININE 2.29* 3.01*  3.03* 2.55* 1.76* 2.29*  CALCIUM 8.0* 7.9*  8.1* 8.2* 8.0* 8.1*  PHOS 2.5 2.8  2.6 3.6  --   --    GFR: Estimated Creatinine Clearance: 30.8 mL/min (A) (by C-G formula based on SCr of 2.29 mg/dL (H)). Liver Function Tests: Recent Labs  Lab 10/28/17 0436 10/29/17 0414 10/31/17 0422  ALBUMIN 2.4* 2.3*  2.4* 2.3*   No results for input(s): LIPASE, AMYLASE in the last 168 hours. No results for input(s): AMMONIA in the last 168 hours. Coagulation Profile: Recent Labs  Lab 11/01/17 0451  INR 1.48   Cardiac Enzymes: Recent Labs  Lab 10/28/17 0436 10/29/17 1100 11/03/17 0345  CKTOTAL 16*  --  52  TROPONINI  --  0.05*  --    BNP (last 3 results) No results for input(s): PROBNP in the last 8760 hours. HbA1C: No results for input(s): HGBA1C in the last 72 hours. CBG: Recent Labs  Lab 11/01/17 1302  GLUCAP 121*   Lipid Profile: No results for input(s): CHOL, HDL, LDLCALC, TRIG, CHOLHDL, LDLDIRECT in the last 72 hours. Thyroid Function Tests: No results for input(s): TSH, T4TOTAL, FREET4, T3FREE, THYROIDAB in the last 72 hours. Anemia Panel: No results  for input(s): VITAMINB12, FOLATE, FERRITIN, TIBC, IRON, RETICCTPCT in the last 72 hours. Sepsis Labs: No results for input(s): PROCALCITON, LATICACIDVEN in the last 168 hours.  Recent Results (from the past 240 hour(s))  Surgical PCR screen     Status: None   Collection Time: 11/01/17 12:38 AM  Result Value Ref Range Status   MRSA, PCR NEGATIVE NEGATIVE Final   Staphylococcus aureus NEGATIVE NEGATIVE Final    Comment: (NOTE) The Xpert SA Assay (FDA approved for NASAL specimens in patients 6 years of age and older), is one component of a comprehensive surveillance program. It is not intended to diagnose infection nor to guide or monitor treatment. Performed at [facility], [place] [address], [place], [place] [address]          Radiology Studies: No results found.      Scheduled Meds: . aspirin  81 mg Oral Daily  . calcium acetate  667 mg Oral TID WC  . carvedilol  25 mg Oral BID WC  . darbepoetin (ARANESP) injection - DIALYSIS  100 mcg Intravenous Q Wed-HD  . gabapentin  100 mg Oral Daily  . hydrALAZINE  100 mg Oral Q8H  .  isosorbide mononitrate  120 mg Oral Daily  . polyethylene glycol  17 g Oral Daily  . [START ON 11/12/2017] pravastatin  40 mg Oral q1800   Continuous Infusions: . cefTRIAXone (ROCEPHIN)  IV Stopped (11/02/17 1849)  . DAPTOmycin (CUBICIN)  IV Stopped (11/02/17 2350)  . heparin 2,800 Units/hr (11/03/17 1250)     LOS: 24 days    Candid Bovey Tanna Furry, MD [facility] Pager [phone]  If 7PM-7AM, please contact night-coverage [email or web address] Password [email or web address] 11/03/2017, 1:16 PM

## 2017-11-03 NOTE — Anesthesia Procedure Notes (Signed)
Procedure Name: Intubation Date/Time: 11/03/2017 5:20 PM Performed by: White, Amedeo Plenty, CRNA Pre-anesthesia Checklist: Patient identified, Emergency Drugs available, Suction available and Patient being monitored Patient Re-evaluated:Patient Re-evaluated prior to induction Oxygen Delivery Method: Circle System Utilized Preoxygenation: Pre-oxygenation with 100% oxygen Induction Type: IV induction Ventilation: Mask ventilation without difficulty Laryngoscope Size: Mac and 3 Grade View: Grade I Tube type: Oral Tube size: 7.0 mm Number of attempts: 1 Airway Equipment and Method: Stylet Placement Confirmation: ETT inserted through vocal cords under direct vision,  positive ETCO2 and breath sounds checked- equal and bilateral Secured at: 22 cm Tube secured with: Tape Dental Injury: Teeth and Oropharynx as per pre-operative assessment

## 2017-11-03 NOTE — Op Note (Signed)
    Patient name: GENESIS NOVOSAD MRN: [id number] DOB: 07/15/1956 Sex: female   11/03/2017 Pre-operative Diagnosis: critical left lower extremity ischemia Post-operative diagnosis:  Same Surgeon:  Erlene Quan C. Donzetta Matters, MD Procedure Performed: 1.  US guided cannulation of right common femoral artery 2.  Aortogram with left lower extremity angiogram 3.  Attempted treatment of left sfa occlusion 4.  Percutaneous closure of right common femoral arteriotomy with proglide device   Indications: 62 year old female now declared end-stage renal disease also has not unreconstructable coronary artery disease and has a necrotic second toe for which she is receiving antibiotics.  She is indicated for angiogram possible intervention.  Findings: Aorta and iliac segments are free of disease.  On the left lower extremity common femoral artery also appears free of flow-limiting stenosis the profunda is the dominant runoff in the thigh.  The SFA is very diminutive proximally and occludes mid thigh.  It reconstitutes above the popliteal artery.  Runoff to the ankle was via the anterior tibial artery and is brisk could not reach the foot.   Procedure:  The patient was identified in the holding area and taken to the operating room where she was placed supine on the operative table and general anesthesia was induced.  She was sterilely prepped and draped in the bilateral groins given antibiotics and timeout called.  We began with ultrasound-guided evaluation of the right common femoral artery which was noted to be mildly diminutive and deep.  A small incision was made in the skin we were able to cannulate with micropuncture needle followed by wire sheath.  We then exchanged for a Bentson wire placed a 6 French sheath.  We performed an aortogram with the above findings.  We then crossed the bifurcation with an Omni catheter initially but then had to use a Glidewire as well and were ultimately able to get across with a quick cross  catheter exchanged back for the Omni catheter with a Rosen wire and performed left lower extremity sequential angiogram with the above findings.  We then placed a Rosen wire back exchanged for a long 6 French sheath and the patient was given 10,000 units of heparin.  Following this we used a combination of never cross a quick cross catheters as well as Glidewire to attempt to cross the occluded SFA.  Ultimately we had a perforation of the mid thigh SFA and given that there was a large collateral it is likely frivolous to attempt crossing this occlusion in an antegrade fashion.  We elected to remove the wires and catheters.  The sheath was removed over a Bentson wire a pro glide device was brought to the field prepped and deployed the common femoral artery and cinched down.  Hemostasis was obtained in the right groin.  Dermabond was placed at the level of the skin.  I then evaluated the left second toe which is not grossly infected and given that we did not establish blood flow to the left foot I elected to not amputate the toe.  Patient was then allowed away from anesthesia having tolerated procedure well without immediate complication.  All counts were correct at completion.  EBL 25 cc.    Contrast 175 cc.    Lonetta Blassingame C. Donzetta Matters, MD [facility] Office: [phone] Pager: [phone]

## 2017-11-03 NOTE — Progress Notes (Signed)
Dr. Donzetta Matters called about heparin gtt. Stated to turn off heparin gtt at this time for surgery.

## 2017-11-03 NOTE — Progress Notes (Signed)
[place] for Heparin Indication: DVT  Allergies  Allergen Reactions  . Crestor [Rosuvastatin Calcium] Other (See Comments)    Leg pain   Patient Measurements: Height: 5\' 8"  (172.7 cm) Weight: 211 lb (95.7 kg) IBW/kg (Calculated) : 63.9 Heparin Dosing Weight: 95.7 kg  Assessment: 62 yo F presented on 1/14 with worsening renal function. Treating left toe osteo.  Found to have DVT on doppler and started on IV heparin infusion. Previously had a difficult time getting heparin levels therapeutic. Recently therapeutic on 2800 units/hr.  Patient back to surgery on 2/7. Heparin to be restarted 6 hours after sheath removal.  Goal of Therapy:  Heparin level 0.3-0.7 units/ml Monitor platelets by anticoagulation protocol: Yes   Plan:  -Resume heparin at 2800 units/hr at 0200 on 11/04/17 -Daily HL, CBC -F/u plan for oral anticoagulation   Orma Render  11/03/2017 7:47 PM

## 2017-11-03 NOTE — Anesthesia Postprocedure Evaluation (Signed)
Anesthesia Post Note  Patient: Kristen Berry  Procedure(s) Performed: Ultrasound Guided Cannulation Right Common Femoral Artery;  Aortagram with Left Lower Extremity Arteriogram; Attempted Treatment Left Superficial Femoral Artery; Percutaneous Closure Right Common Femoral Arteriotomy with Proglide Device (N/A )     Patient location during evaluation: PACU Anesthesia Type: General Level of consciousness: awake and alert Pain management: pain level controlled Vital Signs Assessment: post-procedure vital signs reviewed and stable Respiratory status: spontaneous breathing, nonlabored ventilation, respiratory function stable and patient connected to nasal cannula oxygen Cardiovascular status: blood pressure returned to baseline and stable Postop Assessment: no apparent nausea or vomiting Anesthetic complications: no    Last Vitals:  Vitals:   11/03/17 2052 11/03/17 2115  BP: (!) 111/48 (!) 135/44  Pulse: 63 66  Resp: 13 15  Temp: (!) 36.3 C 36.4 C  SpO2: 96% 93%    Last Pain:  Vitals:   11/03/17 2115  TempSrc: Oral  PainSc:                  [name]

## 2017-11-03 NOTE — Progress Notes (Signed)
Progress Note  Patient Name: Kristen Berry Date of Encounter: 11/03/2017  Primary Cardiologist: Sinclair Grooms, MD   Subjective   No complains today. Dyspnea improving.   Inpatient Medications    Scheduled Meds: . aspirin  81 mg Oral Daily  . calcium acetate  667 mg Oral TID WC  . carvedilol  25 mg Oral BID WC  . darbepoetin (ARANESP) injection - DIALYSIS  100 mcg Intravenous Q Wed-HD  . gabapentin  100 mg Oral Daily  . hydrALAZINE  100 mg Oral Q8H  . isosorbide mononitrate  120 mg Oral Daily  . polyethylene glycol  17 g Oral Daily  . pravastatin  40 mg Oral q1800   Continuous Infusions: . cefTRIAXone (ROCEPHIN)  IV Stopped (11/02/17 1849)  . DAPTOmycin (CUBICIN)  IV Stopped (11/02/17 2350)  . heparin 2,700 Units/hr (11/03/17 0033)   PRN Meds: acetaminophen, camphor-menthol **AND** hydrOXYzine, docusate sodium, feeding supplement (NEPRO CARB STEADY), hydrALAZINE, ipratropium-albuterol, nitroGLYCERIN, ondansetron (ZOFRAN) IV, ondansetron **OR** [DISCONTINUED] ondansetron (ZOFRAN) IV, oxyCODONE, sodium chloride flush, sorbitol, zolpidem   Vital Signs    Vitals:   11/02/17 1447 11/02/17 2120 11/02/17 2130 11/03/17 0431  BP: 139/68 (!) 115/44 (!) 115/44 (!) 151/48  Pulse: (!) 115 84  84  Resp: (!) 21 (!) 26 (!) 21 (!) 22  Temp: 98.2 F (36.8 C) (!) 97.5 F (36.4 C)  97.8 F (36.6 C)  TempSrc: Oral Oral  Oral  SpO2: 94% 99%  97%  Weight:    211 lb 10.3 oz (96 kg)  Height:        Intake/Output Summary (Last 24 hours) at 11/03/2017 0759 Last data filed at 11/03/2017 0500 Gross per 24 hour  Intake 290 ml  Output 2500 ml  Net -2210 ml   Filed Weights   11/02/17 0930 11/02/17 1330 11/03/17 0431  Weight: 219 lb 12.8 oz (99.7 kg) 214 lb 4.6 oz (97.2 kg) 211 lb 10.3 oz (96 kg)    Telemetry    NSR - Personally Reviewed  ECG    N/A  Physical Exam   GEN: No acute distress.   Neck: No JVD Cardiac: RRR, no murmurs, rubs, or gallops. Stable R radial cath site.    Respiratory: Clear to auscultation bilaterally. GI: Soft, nontender, non-distended  MS: No edema; No deformity. RIJ catheter.  Neuro:  Nonfocal  Psych: Normal affect   Labs    Chemistry Recent Labs  Lab 10/28/17 0436 10/29/17 0414 10/31/17 0422 11/01/17 0451 11/02/17 0412  NA 136 135  136 136 137 135  K 3.9 3.6  3.6 3.5 3.7 4.0  CL 99* 97*  98* 99* 97* 97*  CO2 25 24  26 23 26 23   GLUCOSE 94 122*  134* 97 105* 116*  BUN 19 31*  29* 27* 12 16  CREATININE 2.29* 3.01*  3.03* 2.55* 1.76* 2.29*  CALCIUM 8.0* 7.9*  8.1* 8.2* 8.0* 8.1*  ALBUMIN 2.4* 2.3*  2.4* 2.3*  --   --   GFRNONAA 22* 16*  15* 19* 30* 22*  GFRAA 25* 18*  18* 22* 35* 25*  ANIONGAP 12 14  12 14 14 15      Hematology Recent Labs  Lab 11/01/17 0451 11/02/17 0412 11/03/17 0345  WBC 10.8* 11.7* 10.3  RBC 2.93* 2.93* 2.89*  HGB 8.0* 8.0* 8.0*  HCT 27.3* 27.2* 26.8*  MCV 93.2 92.8 92.7  MCH 27.3 27.3 27.7  MCHC 29.3* 29.4* 29.9*  RDW 19.8* 19.9* 20.0*  PLT 402* 422* 415*  Cardiac Enzymes Recent Labs  Lab 10/29/17 1100  TROPONINI 0.05*   No results for input(s): TROPIPOC in the last 168 hours.    Radiology    No results found.  Cardiac Studies   TTE: 10/17/17  Study Conclusions  - Left ventricle: Septal and apical akinesis. The cavity size was moderately dilated. Wall thickness was increased in a pattern of mild LVH. Systolic function was moderately reduced. The estimated ejection fraction was in the range of 35% to 40%. Doppler parameters are consistent with both elevated ventricular end-diastolic filling pressure and elevated left atrial filling pressure. - Mitral valve: Moderately calcified, moderately thickened annulus. Moderately thickened leaflets . The findings are consistent with mild stenosis. There was moderate regurgitation. Valve area by continuity equation (using LVOT flow): 1.5 cm^2. - Left atrium: The atrium was mildly dilated. - Atrial  septum: No defect or patent foramen ovale was identified. - Pulmonary arteries: PA peak pressure: 55 mm Hg (S).   LEFT HEART CATH AND CORONARY ANGIOGRAPHY  Conclusion    Severe diffuse diabetic coronary disease. Severe diffuse coronary calcification particularly in the left main and LAD territory.  40-50% distal left main.  Segmental diffuse ostial to proximal LAD 70-80% stenosis.  Unusual circumflex anatomy. Diffuse distal disease. No focal high-grade obstruction. 40% obstruction in the mid first obtuse marginal.  RCA is dominant. The continuation of the RCA beyond the PDA contains 75% obstruction. The mid PDA contains 70% obstruction proximal to bifurcation. Distal first left ventricular branch contains 70% obstruction.  Chronic combined systolic and diastolic heart failure with EF in the 45-50% range. Severe elevation in LVEDP at 30 mmHg.  RECOMMENDATIONS:   Multivessel diffuse coronary disease with regions of significant obstruction within diffuse pattern of atherosclerosis as noted above. There is no high-grade obstruction with the appearance of instability. She has borderline significant distal left main disease  Initial recommendation is aggressive medical therapy. There is no ideal targets for PCI. The diffuse nature of disease would make coronary bypass surgery difficult but I believe it is still possible. I do not believe she is yet at a point where CABG should be recommended. Will discuss with colleagues.   Diagnostic Diagram       Patient Profile     62 y.o.femalewith PMH of systolic HF, HTN, HL, CKD IV, osteomyelitis and PVD who presented with worsening renal function.Developed acute respiratory distress and acute renal failure.Echo 10/17/17 with LVEF=35-40%.  Started on dialysis this admission. Ortho and vascular surgery was consulted due to persistent leukocytosis. Plan for lower extremity angiogram for possible revascularization and left  2nd toe amputation.   Also found to have R lower extremity >> on heparin.   Assessment & Plan    1. Acute on chronic systolic CHF - EFas declined and now 35-40% by echo. Given multiple comorbid issuesfelt not to be a cath candidateover the past few daysand plan was todefer invasive coronary evaluation but now she is on HD permanently and needs to have an AVF placed.As well as plan to toe amputation this admission.  - Given need to cardiac risk factors the patient under went cath as noted above.  -  Volume managed by dialysis.  - Continue BB. Not on ACE/ARB as on dialysis. Continue hydralazine and imdur.   2. CAD - Cath showed multivessel diffuse coronary disease. No clear target for PCI. ? CABG is options, however not in need currently. Dr. Tamala Julian to review films with other interventional team member.  - Continue ASA, imdur and BB.  3. HLD 10/18/2017: Cholesterol 184; HDL 39; LDL Cholesterol 109; Triglycerides 180; VLDL 36 - Allergic to Crestor. LDL goal less than 70. Plan to start Pravastatin 40mg  on 2/15 after she off Dapto. Reviewed with pharmacist.   4. HTN - BP improved on increased coreg yesterday.   5. PVD/L 2nd toe cellulitis - Plan for angiogram and fistula creation likely today or tomorrow.   For questions or updates, please contact [place] Please consult [email or web address] for contact info under Cardiology/STEMI.      Signed, Leanor Kail, PA  11/03/2017, 7:59 AM

## 2017-11-03 NOTE — Progress Notes (Signed)
Occupational Therapy Treatment Patient Details Name: Kristen Berry MRN: [id number] DOB: 24-Oct-1955 Today's Date: 11/03/2017    History of present illness Patient is a 62 year old female who presented to the hospital with shortness of breath. She was found to have an acute kidney injury. PMH: PVD, HTN, CHF CKD, OA    OT comments  Pt up in chair since earlier PT visit, awaiting toe amputation surgery today. Pt reporting dizziness, needing to sit at sink to complete grooming. Min assist for sit to stand and toileting on 3 in 1. Pt with poor endurance. Returned pt to bed. Will continue to follow.  Follow Up Recommendations  Home health OT;Supervision/Assistance - 24 hour    Equipment Recommendations  None recommended by OT    Recommendations for Other Services      Precautions / Restrictions Precautions Precautions: Fall       Mobility Bed Mobility Overal bed mobility: Needs Assistance Bed Mobility: Sit to Supine       Sit to supine: Min assist   General bed mobility comments: assisted LEs into bed  Transfers Overall transfer level: Needs assistance Equipment used: Rolling walker (2 wheeled) Transfers: Sit to/from Stand Sit to Stand: Min assist         General transfer comment: assist to rise, use of momentum to stand from recliner and 3 in 1    Balance Overall balance assessment: Needs assistance   Sitting balance-Leahy Scale: Good     Standing balance support: Bilateral upper extremity supported;During functional activity;No upper extremity supported Standing balance-Leahy Scale: Poor                             ADL either performed or assessed with clinical judgement   ADL Overall ADL's : Needs assistance/impaired     Grooming: Wash/dry hands;Wash/dry face;Oral care;Sitting;Set up Grooming Details (indicate cue type and reason): pt unable to tolerate standing at sink, pulled 3 in 1 up behind her, reports dizziness                 Toilet  Transfer: Min guard;Stand-pivot;RW;BSC   Toileting- Clothing Manipulation and Hygiene: Minimal assistance;Sit to/from stand         General ADL Comments: pt awaiting toe amputation today, has not eaten all day     Vision       Perception     Praxis      Cognition Arousal/Alertness: Awake/alert Behavior During Therapy: WFL for tasks assessed/performed Overall Cognitive Status: Within Functional Limits for tasks assessed                                          Exercises     Shoulder Instructions       General Comments      Pertinent Vitals/ Pain       Pain Assessment: No/denies pain  Home Living                                          Prior Functioning/Environment              Frequency  Min 2X/week        Progress Toward Goals  OT Goals(current goals can now be found in the care plan section)  Progress towards OT goals: Not progressing toward goals - comment(poor endurance and dizziness today)  Acute Rehab OT Goals Patient Stated Goal: Feel stronger  OT Goal Formulation: With patient Time For Goal Achievement: 11/17/17 Potential to Achieve Goals: Good  Plan Discharge plan remains appropriate    Co-evaluation                 AM-PAC PT "6 Clicks" Daily Activity     Outcome Measure   Help from another person eating meals?: None Help from another person taking care of personal grooming?: A Little Help from another person toileting, which includes using toliet, bedpan, or urinal?: A Little Help from another person bathing (including washing, rinsing, drying)?: A Little Help from another person to put on and taking off regular upper body clothing?: A Little Help from another person to put on and taking off regular lower body clothing?: A Little 6 Click Score: 19    End of Session Equipment Utilized During Treatment: Oxygen;Gait belt;Rolling walker  OT Visit Diagnosis: Other abnormalities of gait and  mobility (R26.89)   Activity Tolerance Patient limited by fatigue(dizziness)   Patient Left in bed;with call bell/phone within reach   Nurse Communication          Time: 1791-5056 OT Time Calculation (min): 20 min  Charges: OT General Charges $OT Visit: 1 Visit OT Treatments $Self Care/Home Management : 8-22 mins  11/03/2017 Nestor Lewandowsky, OTR/L Pager: [place], Haze Boyden 11/03/2017, 3:35 PM

## 2017-11-03 NOTE — Transfer of Care (Signed)
Immediate Anesthesia Transfer of Care Note  Patient: Kristen Berry  Procedure(s) Performed: Ultrasound Guided Cannulation (N/A )  Patient Location: PACU  Anesthesia Type:General  Level of Consciousness: sedated, drowsy and Patient remains intubated per anesthesia plan  Airway & Oxygen Therapy: Patient remains intubated per anesthesia plan and Patient placed on Ventilator (see vital sign flow sheet for setting)  Post-op Assessment: Report given to RN and Post -op Vital signs reviewed and stable  Post vital signs: Reviewed and stable  Last Vitals:  Vitals:   11/03/17 1525 11/03/17 1936  BP: (!) 140/40 (!) 123/109  Pulse: 70 75  Resp: (!) 21 20  Temp: (!) 36.3 C (!) 36.3 C  SpO2: 100% 95%    Last Pain:  Vitals:   11/03/17 1936  TempSrc:   PainSc: 0-No pain         Complications:  Patient weak at end of procedure.  To PACU intubated to let Rocuronium wear off.

## 2017-11-03 NOTE — Progress Notes (Signed)
Patient ID: Kristen Berry, female   DOB: Jul 06, 1956, 62 y.o.   MRN: [id number]  [facility] Progress Note   Assessment/ Plan:   1. Acute congestive heart failure exacerbation: Improving and currently under fair control with UF and hemodialysis, coronary angiogram showed diffuse disease with no PCI targets and no indications for CABG yet.  2. ESRD: following significant AKI on CKD IV. She had dialysis yesterday and I will evaluate her again tomorrow to see if we can hold off dialysis until Saturday to get to her OP HD schedule- plans for HD access surgery noted from Dr.Cain's note.   3. Anemia:hemoglobin level low but stable, increased Aranesp dose. No overt loss and will decide on need for PRBCs post-op.  4. CKD-MBD: calcium/phosphorus level within acceptable range, continue calcium acetate with meals. PTH level at goal for ESRD. 5. Left second toe gangrene/cellulitis: For arteriogram and amputation of toe later today by Dr.Cain.   6. Hypertension:blood pressure marginally elevated, .  Subjective:   Reports to be feeling fair, somewhat anxious about surgery later today.   Objective:   BP (!) 150/50   Pulse 82   Temp 97.9 F (36.6 C) (Oral)   Resp (!) 27   Ht 5\' 8"  (1.727 m)   Wt 96 kg (211 lb 10.3 oz)   SpO2 98%   BMI 32.18 kg/m   Physical Exam: [id number] resting in recliner CVS: pulse regular rhythm and normal rate, S1 and S2 normal Resp:. Clear anteriorly, no distinct rales or rhonchi UYQ:IHKV, obese, nontender Ext: trace ankle edema  Labs: BMET Recent Labs  Lab 10/28/17 0436 10/29/17 0414 10/31/17 0422 11/01/17 0451 11/02/17 0412  NA 136 135  136 136 137 135  K 3.9 3.6  3.6 3.5 3.7 4.0  CL 99* 97*  98* 99* 97* 97*  CO2 25 24  26 23 26 23   GLUCOSE 94 122*  134* 97 105* 116*  BUN 19 31*  29* 27* 12 16  CREATININE 2.29* 3.01*  3.03* 2.55* 1.76* 2.29*  CALCIUM 8.0* 7.9*  8.1* 8.2* 8.0* 8.1*  PHOS 2.5 2.8  2.6 3.6  --   --    CBC Recent  Labs  Lab 10/31/17 0422 11/01/17 0451 11/02/17 0412 11/03/17 0345  WBC 12.8* 10.8* 11.7* 10.3  HGB 8.0* 8.0* 8.0* 8.0*  HCT 26.5* 27.3* 27.2* 26.8*  MCV 92.0 93.2 92.8 92.7  PLT 385 402* 422* 415*   Medications:    . aspirin  81 mg Oral Daily  . calcium acetate  667 mg Oral TID WC  . carvedilol  25 mg Oral BID WC  . darbepoetin (ARANESP) injection - DIALYSIS  100 mcg Intravenous Q Wed-HD  . gabapentin  100 mg Oral Daily  . hydrALAZINE  100 mg Oral Q8H  . isosorbide mononitrate  120 mg Oral Daily  . polyethylene glycol  17 g Oral Daily  . [START ON 11/12/2017] pravastatin  40 mg Oral q1800   Kristen Shiley, MD 11/03/2017, 10:30 AM

## 2017-11-03 NOTE — Anesthesia Preprocedure Evaluation (Addendum)
Anesthesia Evaluation  Patient identified by MRN, date of birth, ID band Patient awake    Reviewed: Allergy & Precautions, NPO status , Patient's Chart, lab work & pertinent test results, reviewed documented beta blocker date and time   Airway Mallampati: III  TM Distance: >3 FB Neck ROM: Full    Dental  (+) Dental Advisory Given, Missing, Poor Dentition   Pulmonary sleep apnea ,    Pulmonary exam normal breath sounds clear to auscultation       Cardiovascular hypertension, Pt. on home beta blockers and Pt. on medications + angina with exertion + CAD, + Peripheral Vascular Disease and +CHF  Normal cardiovascular exam Rhythm:Regular Rate:Normal  Cath 10/2017 - Severe diffuse diabetic coronary disease.  Severe diffuse coronary calcification particularly in the left main and LAD territory. 40-50% distal left main. Segmental diffuse ostial to proximal LAD 70-80% stenosis. Unusual circumflex anatomy.  Diffuse distal disease.  No focal high-grade obstruction.  40% obstruction in the mid first obtuse marginal. RCA is dominant.  The continuation of the RCA beyond the PDA contains 75% obstruction.  The mid PDA contains 70% obstruction proximal to bifurcation.  Distal first left ventricular branch contains 70% obstruction. Chronic combined systolic and diastolic heart failure with EF in the 45-50% range.  Severe elevation in LVEDP at 30 mmHg.  TTE 09/2017 - Septal and apical akinesis. LV was  moderately dilated. Mild LVH. EF in the range of 35% to 40%. Mild MS, moderate MR. Mildly dilated LA. PASP: 55 mm Hg (S).   Neuro/Psych negative neurological ROS  negative psych ROS   GI/Hepatic negative GI ROS, Neg liver ROS,   Endo/Other  diabetes, Type 2Obesity  Renal/GU ESRF and DialysisRenal disease  negative genitourinary   Musculoskeletal  (+) Arthritis ,   Abdominal   Peds  Hematology  (+) anemia ,   Anesthesia Other  Findings   Reproductive/Obstetrics                            Anesthesia Physical Anesthesia Plan  ASA: IV  Anesthesia Plan: General   Post-op Pain Management:    Induction: Intravenous  PONV Risk Score and Plan: 4 or greater and Treatment may vary due to age or medical condition, Ondansetron and Dexamethasone  Airway Management Planned: Oral ETT  Additional Equipment: None  Intra-op Plan:   Post-operative Plan: Extubation in OR  Informed Consent: I have reviewed the patients History and Physical, chart, labs and discussed the procedure including the risks, benefits and alternatives for the proposed anesthesia with the patient or authorized representative who has indicated his/her understanding and acceptance.   Dental advisory given  Plan Discussed with: CRNA  Anesthesia Plan Comments:         Anesthesia Quick Evaluation

## 2017-11-04 ENCOUNTER — Encounter (HOSPITAL_COMMUNITY)
Admission: AD | Disposition: A | Discharge status: Home Health | Payer: Self-pay | Source: Ambulatory Visit | Attending: Nephrology

## 2017-11-04 ENCOUNTER — Encounter (HOSPITAL_COMMUNITY): Payer: Self-pay | Admitting: Vascular Surgery

## 2017-11-04 DIAGNOSIS — I200 Unstable angina: Secondary | ICD-10-CM

## 2017-11-04 LAB — CBC
HCT: 26.1 % — ABNORMAL LOW (ref 36.0–46.0)
Hemoglobin: 7.5 g/dL — ABNORMAL LOW (ref 12.0–15.0)
MCH: 27.4 pg (ref 26.0–34.0)
MCHC: 28.7 g/dL — ABNORMAL LOW (ref 30.0–36.0)
MCV: 95.3 fL (ref 78.0–100.0)
Platelets: 408 10*3/uL — ABNORMAL HIGH (ref 150–400)
RBC: 2.74 MIL/uL — ABNORMAL LOW (ref 3.87–5.11)
RDW: 20.2 % — ABNORMAL HIGH (ref 11.5–15.5)
WBC: 9.2 10*3/uL (ref 4.0–10.5)

## 2017-11-04 LAB — HEPARIN LEVEL (UNFRACTIONATED): Heparin Unfractionated: 0.48 IU/mL (ref 0.30–0.70)

## 2017-11-04 LAB — GLUCOSE, CAPILLARY: Glucose-Capillary: 130 mg/dL — ABNORMAL HIGH (ref 65–99)

## 2017-11-04 SURGERY — ABDOMINAL AORTOGRAM W/LOWER EXTREMITY
Anesthesia: LOCAL

## 2017-11-04 MED ORDER — AMLODIPINE BESYLATE 5 MG PO TABS
5.0000 mg | ORAL_TABLET | Freq: Every day | ORAL | Status: DC
  Administered 2017-11-04 – 2017-11-13 (×7): 5 mg via ORAL
  Filled 2017-11-04 (×9): qty 1

## 2017-11-04 MED ORDER — PANTOPRAZOLE SODIUM 40 MG PO TBEC
40.0000 mg | DELAYED_RELEASE_TABLET | Freq: Every day | ORAL | Status: DC
  Administered 2017-11-04 – 2017-11-16 (×12): 40 mg via ORAL
  Filled 2017-11-04 (×12): qty 1

## 2017-11-04 MED ORDER — HYDRALAZINE HCL 50 MG PO TABS
50.0000 mg | ORAL_TABLET | Freq: Three times a day (TID) | ORAL | Status: DC
  Administered 2017-11-04 – 2017-11-13 (×21): 50 mg via ORAL
  Filled 2017-11-04 (×23): qty 1

## 2017-11-04 NOTE — Progress Notes (Signed)
  Progress Note    11/04/2017 8:15 AM 1 Day Post-Op  Subjective: no new complaints  Vitals:   11/04/17 0200 11/04/17 0400  BP: (!) 145/52 (!) 150/50  Pulse: 74 76  Resp: 15 14  Temp:  (!) 97.5 F (36.4 C)  SpO2: 99% 100%    Physical Exam: aaox3 Right groin is soft and right foot warm Left foot dressing cdi  CBC    Component Value Date/Time   WBC 9.2 11/04/2017 0415   RBC 2.74 (L) 11/04/2017 0415   HGB 7.5 (L) 11/04/2017 0415   HGB 14.3 12/14/2016 1139   HCT 26.1 (L) 11/04/2017 0415   HCT 44.2 12/14/2016 1139   PLT 408 (H) 11/04/2017 0415   PLT 429 (H) 12/14/2016 1139   MCV 95.3 11/04/2017 0415   MCV 88.5 12/14/2016 1139   MCH 27.4 11/04/2017 0415   MCHC 28.7 (L) 11/04/2017 0415   RDW 20.2 (H) 11/04/2017 0415   RDW 17.6 (H) 12/14/2016 1139   LYMPHSABS 0.8 10/26/2017 1200   LYMPHSABS 1.9 12/14/2016 1139   MONOABS 1.9 (H) 10/26/2017 1200   MONOABS 0.7 12/14/2016 1139   EOSABS 0.3 10/26/2017 1200   EOSABS 0.0 10/17/2017 1430   BASOSABS 0.1 10/26/2017 1200   BASOSABS 0.1 12/14/2016 1139    BMET    Component Value Date/Time   NA 135 11/02/2017 0412   NA 137 12/14/2016 1139   K 4.0 11/02/2017 0412   K 4.2 12/14/2016 1139   CL 97 (L) 11/02/2017 0412   CO2 23 11/02/2017 0412   CO2 20 (L) 12/14/2016 1139   GLUCOSE 116 (H) 11/02/2017 0412   GLUCOSE 115 12/14/2016 1139   BUN 16 11/02/2017 0412   BUN 32.8 (H) 12/14/2016 1139   CREATININE 2.29 (H) 11/02/2017 0412   CREATININE 2.1 (H) 12/14/2016 1139   CALCIUM 8.1 (L) 11/02/2017 0412   CALCIUM 9.5 12/14/2016 1139   GFRNONAA 22 (L) 11/02/2017 0412   GFRAA 25 (L) 11/02/2017 0412    INR    Component Value Date/Time   INR 1.48 11/01/2017 0451     Intake/Output Summary (Last 24 hours) at 11/04/2017 0815 Last data filed at 11/04/2017 0600 Gross per 24 hour  Intake 446.5 ml  Output 50 ml  Net 396.5 ml     Assessment/plan:  62 y.o. female is s/p attempted left lower extremity revascularization that was  unsuccessful and she has a moderate length segment SFA occlusion on the left and a diminutive proximal SFA.  She would be best served with left femoral to popliteal artery bypass and I will get vein mapping if her coronary status is prohibitive to invasive vascular surgery and she would have a repeat endovascular option from a retrograde tibial approach.  She will also need to amputation as this was not done yesterday does not appear grossly infected although bone is exposed.  She should continue antibiotics.  She will also need conversion to a tunneled dialysis catheter and consideration of left arm fistula versus graft prior to discharge.  I would like to have cardiology's recommendations prior to planning surgical revascularization for next week.   Dontrell Stuck C. Donzetta Matters, MD [facility] Office: [phone] Pager: [phone]  11/04/2017 8:15 AM

## 2017-11-04 NOTE — Progress Notes (Signed)
Physical Therapy Treatment Patient Details Name: Kristen Berry MRN: [id number] DOB: [birth year] Today's Date: 11/04/2017    History of Present Illness Patient is a 62 year old female who presented to the hospital with shortness of breath. She was found to have an acute kidney injury. PMH: PVD, HTN, CHF CKD, OA     PT Comments    Pt feeling discomfort and heaviness today in her L LE after Aortogram with left lower extremity angiogram 11/03/16, and does not fell up to walking today. Pt willing to participate in LE strengthening in bed. PT will continue to follow acutely to progress mobility.      Follow Up Recommendations  Home health PT     Equipment Recommendations  Rolling walker with 5" wheels    Recommendations for Other Services       Precautions / Restrictions Precautions Precautions: Fall          Cognition Arousal/Alertness: Awake/alert Behavior During Therapy: WFL for tasks assessed/performed Overall Cognitive Status: Within Functional Limits for tasks assessed                                        Exercises General Exercises - Lower Extremity Ankle Circles/Pumps: AROM;Both;10 reps Quad Sets: AROM;Strengthening;Both;10 reps Gluteal Sets: AROM;Strengthening;Both;10 reps Heel Slides: AROM;Both;10 reps Hip ABduction/ADduction: AROM;Both;10 reps    General Comments General comments (skin integrity, edema, etc.): Pt states she is having some heaviness and discomfort in her L LE and prefers not to ambulate today.      Pertinent Vitals/Pain Pain Assessment: Faces Faces Pain Scale: Hurts a little bit Pain Location: general discomfort, L LE  Pain Descriptors / Indicators: Discomfort Pain Intervention(s): Limited activity within patient's tolerance           PT Goals (current goals can now be found in the care plan section) Acute Rehab PT Goals Patient Stated Goal: Feel stronger  PT Goal Formulation: With patient Time For Goal Achievement:  11/07/17 Potential to Achieve Goals: Good Progress towards PT goals: Not progressing toward goals - comment(not feeling well enough to walk today)    Frequency    Min 3X/week      PT Plan Current plan remains appropriate       AM-PAC PT "6 Clicks" Daily Activity  Outcome Measure  Difficulty turning over in bed (including adjusting bedclothes, sheets and blankets)?: None Difficulty moving from lying on back to sitting on the side of the bed? : None Difficulty sitting down on and standing up from a chair with arms (e.g., wheelchair, bedside commode, etc,.)?: A Little Help needed moving to and from a bed to chair (including a wheelchair)?: A Little Help needed walking in hospital room?: A Little Help needed climbing 3-5 steps with a railing? : A Little 6 Click Score: 20    End of Session Equipment Utilized During Treatment: Oxygen Activity Tolerance: Patient limited by fatigue Patient left: with call bell/phone within reach;in chair;with chair alarm set Nurse Communication: Mobility status PT Visit Diagnosis: Difficulty in walking, not elsewhere classified (R26.2);Other abnormalities of gait and mobility (R26.89)     Time: 0626-9485 PT Time Calculation (min) (ACUTE ONLY): 13 min  Charges:  $Therapeutic Exercise: 8-22 mins                    G Codes:       Bennett Vanscyoc B. Migdalia Dk PT, DPT Acute Rehabilitation  [phone] Pager [phone]     [place] 11/04/2017, 5:17 PM

## 2017-11-04 NOTE — Progress Notes (Signed)
Progress Note  Patient Name: Kristen Berry Date of Encounter: 11/04/2017  Primary Cardiologist: Belva Crome III, MD   Subjective   Intermittent chest discomfort after eating food for 2-5 minutes.   Inpatient Medications    Scheduled Meds: . aspirin  81 mg Oral Daily  . calcium acetate  667 mg Oral TID WC  . carvedilol  25 mg Oral BID WC  . darbepoetin (ARANESP) injection - DIALYSIS  100 mcg Intravenous Q Wed-HD  . gabapentin  100 mg Oral Daily  . hydrALAZINE  100 mg Oral Q8H  . isosorbide mononitrate  120 mg Oral Daily  . polyethylene glycol  17 g Oral Daily  . [START ON 11/12/2017] pravastatin  40 mg Oral q1800  . sodium chloride flush  3 mL Intravenous Q12H   Continuous Infusions: . sodium chloride 10 mL/hr at 11/04/17 0600  . sodium chloride    . cefTRIAXone (ROCEPHIN)  IV 0 g (11/02/17 1849)  . DAPTOmycin (CUBICIN)  IV Stopped (11/02/17 2350)  . heparin 2,800 Units/hr (11/04/17 0600)   PRN Meds: sodium chloride, acetaminophen, camphor-menthol **AND** hydrOXYzine, docusate sodium, feeding supplement (NEPRO CARB STEADY), hydrALAZINE, ipratropium-albuterol, labetalol, nitroGLYCERIN, ondansetron (ZOFRAN) IV, ondansetron **OR** [DISCONTINUED] ondansetron (ZOFRAN) IV, oxyCODONE, sodium chloride flush, sodium chloride flush, sorbitol, zolpidem   Vital Signs    Vitals:   11/04/17 0200 11/04/17 0400 11/04/17 0500 11/04/17 0700  BP: (!) 145/52 (!) 150/50  (!) 129/42  Pulse: 74 76  87  Resp: 15 14  (!) 22  Temp:  (!) 97.5 F (36.4 C)  (!) 97.3 F (36.3 C)  TempSrc:  Oral  Oral  SpO2: 99% 100%  98%  Weight:   211 lb 3.2 oz (95.8 kg)   Height:        Intake/Output Summary (Last 24 hours) at 11/04/2017 0836 Last data filed at 11/04/2017 0600 Gross per 24 hour  Intake 446.5 ml  Output 50 ml  Net 396.5 ml   Filed Weights   11/03/17 0431 11/03/17 1626 11/04/17 0500  Weight: 211 lb 10.3 oz (96 kg) 211 lb (95.7 kg) 211 lb 3.2 oz (95.8 kg)    Telemetry    NSR -  Personally Reviewed  ECG    SR with non specific ST abnormality (biphasic TWI) in lateral leads which is similar to prior EKG - Personally Reviewed  Physical Exam   GEN: No acute distress.   Neck: No JVD Cardiac: RRR, no murmurs, rubs, or gallops.  Respiratory:diminished breath sound throughout GI: Soft, nontender, non-distended  MS: No edema; L toe dressing  Neuro:  Nonfocal  Psych: Normal affect   Labs    Chemistry Recent Labs  Lab 10/29/17 0414 10/31/17 0422 11/01/17 0451 11/02/17 0412  NA 135  136 136 137 135  K 3.6  3.6 3.5 3.7 4.0  CL 97*  98* 99* 97* 97*  CO2 24  26 23 26 23   GLUCOSE 122*  134* 97 105* 116*  BUN 31*  29* 27* 12 16  CREATININE 3.01*  3.03* 2.55* 1.76* 2.29*  CALCIUM 7.9*  8.1* 8.2* 8.0* 8.1*  ALBUMIN 2.3*  2.4* 2.3*  --   --   GFRNONAA 16*  15* 19* 30* 22*  GFRAA 18*  18* 22* 35* 25*  ANIONGAP 14  12 14 14 15      Hematology Recent Labs  Lab 11/02/17 0412 11/03/17 0345 11/04/17 0415  WBC 11.7* 10.3 9.2  RBC 2.93* 2.89* 2.74*  HGB 8.0* 8.0* 7.5*  HCT  27.2* 26.8* 26.1*  MCV 92.8 92.7 95.3  MCH 27.3 27.7 27.4  MCHC 29.4* 29.9* 28.7*  RDW 19.9* 20.0* 20.2*  PLT 422* 415* 408*    Cardiac Enzymes Recent Labs  Lab 10/29/17 1100  TROPONINI 0.05*   No results for input(s): TROPIPOC in the last 168 hours.    Radiology    TTE: 10/17/17  Study Conclusions  - Left ventricle: Septal and apical akinesis. The cavity size was moderately dilated. Wall thickness was increased in a pattern of mild LVH. Systolic function was moderately reduced. The estimated ejection fraction was in the range of 35% to 40%. Doppler parameters are consistent with both elevated ventricular end-diastolic filling pressure and elevated left atrial filling pressure. - Mitral valve: Moderately calcified, moderately thickened annulus. Moderately thickened leaflets . The findings are consistent with mild stenosis. There was moderate  regurgitation. Valve area by continuity equation (using LVOT flow): 1.5 cm^2. - Left atrium: The atrium was mildly dilated. - Atrial septum: No defect or patent foramen ovale was identified. - Pulmonary arteries: PA peak pressure: 55 mm Hg (S).   LEFT HEART CATH AND CORONARY ANGIOGRAPHY  Conclusion    Severe diffuse diabetic coronary disease. Severe diffuse coronary calcification particularly in the left main and LAD territory.  40-50% distal left main.  Segmental diffuse ostial to proximal LAD 70-80% stenosis.  Unusual circumflex anatomy. Diffuse distal disease. No focal high-grade obstruction. 40% obstruction in the mid first obtuse marginal.  RCA is dominant. The continuation of the RCA beyond the PDA contains 75% obstruction. The mid PDA contains 70% obstruction proximal to bifurcation. Distal first left ventricular branch contains 70% obstruction.  Chronic combined systolic and diastolic heart failure with EF in the 45-50% range. Severe elevation in LVEDP at 30 mmHg.  RECOMMENDATIONS:   Multivessel diffuse coronary disease with regions of significant obstruction within diffuse pattern of atherosclerosis as noted above. There is no high-grade obstruction with the appearance of instability. She has borderline significant distal left main disease  Initial recommendation is aggressive medical therapy. There is no ideal targets for PCI. The diffuse nature of disease would make coronary bypass surgery difficult but I believe it is still possible. I do not believe she is yet at a point where CABG should be recommended. Will discuss with colleagues.   Diagnostic Diagram       Patient Profile   62 y.o.femalewith PMH of systolic HF, HTN, HL, CKD IV, osteomyelitis and PVD who presented with worsening renal function.Developed acute respiratory distress and acute renal failure.Echo 10/17/17 with LVEF=35-40%.  Started on dialysis this admission. Ortho and  vascular surgery was consulted due to persistent leukocytosis. Plan forlower extremity angiogram for possible revascularizationand left 2nd toe amputation.      11/03/17: Unsuccessful left lower extremity revascularization. Plan for left femoral to popliteal artery bypass next      week. Also needs tunneled dialysis catheter and consideration of left arm fistula versus graft prior to discharge.   Also found to have R lower extremity DVT >> on heparin.  Assessment & Plan   1. Acute on chronic systolic CHF -EFas declined and now 35-40% by echo. Given multiple comorbid issuesfelt not to be a cath candidateover the past few daysand plan was todefer invasive coronary evaluation but now she is on HD permanently and needs to have an AVF placed.As well as plan to toe amputation this admission.  - Given need to cardiac risk factors the patient under went cath as noted above.  -  Volume  managed by dialysis. Net I & O negative 13L.  - Continue BB. Not on ACE/ARB as on dialysis. Continue hydralazine and imdur.   2. CAD - Cath showed multivessel diffuse coronary disease. No clear target for PCI. ? CABG is options, however not in need currently. Likely medical management. No angina pain. Some epigastric pain after food. Start PPI.  - Continue ASA, imdur and BB.   3. HLD 10/18/2017: Cholesterol 184; HDL 39; LDL Cholesterol 109; Triglycerides 180; VLDL 36 - Allergic to Crestor. LDL goal less than 70. Plan to start Pravastatin 40mg  on 2/15 after she off Dapto. Reviewed with pharmacist.   4. HTN - BP improving.  5. PVD/L 2nd toe cellulitis - As above. Dr. Radford Pax to comment on surgical clearance.     For questions or updates, please contact [place] Please consult [email or web address] for contact info under Cardiology/STEMI.      Kristen Soho, PA  11/04/2017, 8:36 AM

## 2017-11-04 NOTE — Progress Notes (Signed)
[place] for Heparin Indication: DVT  Allergies  Allergen Reactions  . Crestor [Rosuvastatin Calcium] Other (See Comments)    Leg pain   Patient Measurements: Height: 5\' 8"  (172.7 cm) Weight: 211 lb 3.2 oz (95.8 kg) IBW/kg (Calculated) : 63.9 Heparin Dosing Weight: 95.7 kg  Assessment: 62 yo F presented on 1/14 with worsening renal function. Treating left toe osteo.  Found to have DVT on doppler and started on IV heparin infusion. Previously had a difficult time getting heparin levels therapeutic. Recently therapeutic on 2800 units/hr.  Back and forth to procedures - no warfarin for now.  Goal of Therapy:  Heparin level 0.3-0.7 units/ml Monitor platelets by anticoagulation protocol: Yes   Plan:  -Continue heparin at 2800 units / hr -Daily HL, CBC -F/u plan for oral anticoagulation  Thank you Anette Guarneri, PharmD [phone] 11/04/2017 12:37 PM

## 2017-11-04 NOTE — Progress Notes (Signed)
PROGRESS NOTE    Kristen Berry  ENI:[id number] DOB: 1956-04-12 DOA: 10/10/2017 PCP: Martinique, Sarah T, MD   Brief Narrative: 62 y.o. 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  Still with fluid overload.  Plan for dialysis today and tomorrow for ultrafiltration.  Patient also going to have tunneled catheter and AV fistula or AV graft likely next week with vascular surgery.  Consultants recommendation appreciated.    - For HD, patient is accepted at [facility] at 61:44RX  #Acute systolic congestive heart failure/cardiomyopathy: EF of 35-40%.   -s/p cath with multi-vessel disease with no target for PCI and no indication for CABG. No chest pain today.  -on ASA, coreg, statin, hydralazine, imdur -Ultrafiltration during dialysis.  -cardiology consult appreciated.  -Statin after completion of daptomycin.  #Anemia of chronic kidney disease: Continue Aranesp and IV  iron during the dialysis.  Monitor CBC  #Chest pain/shortness of breath likely due to fluid overload and pulmonary edema: Improved now  #Left second toe osteomyelitis/peripheral vascular disease:  -s/p attempted left lower extremity revascularization that was unsuccessful and she has a moderate length segment SFA occlusion on the left and a diminutive proximal SFA on 11/03/2017.  The vascular is recommending for left femoral to popliteal artery bypass and probably toe surgery early next week.  Cardiac evaluation ongoing.  #Right lower extremity DVT: On IV heparin.  Needs oral long-term anticoagulation/oral after the procedure done.  #Essential hypertension: Monitor blood pressure.  Continue current medication.  Dialysis per nephrology.  #Physical deconditioning: PT OT evaluation.  DVT prophylaxis: heparin Code Status:full code Family Communication: No family at bedside Disposition Plan: Likely discharge home  in 3-4 days.    Consultants:   Cardiology  Vascular surgery  Orthopedics  Nephrology  Procedures: Multiple imaging studies, vascular procedure Antimicrobials: Rocephin and daptomycin  Subjective: Seen and examined at bedside.  Mild shortness of breath.  Denies chest pain.  No nausea vomiting headache.  Objective: Vitals:   11/04/17 0400 11/04/17 0500 11/04/17 0700 11/04/17 1158  BP: (!) 150/50  (!) 129/42 (!) 117/43  Pulse: 76  87 76  Resp: 14  (!) 22 17  Temp: (!) 97.5 F (36.4 C)  (!) 97.3 F (36.3 C) (!) 97.4 F (36.3 C)  TempSrc: Oral  Oral Oral  SpO2: 100%  98% 98%  Weight:  95.8 kg (211 lb 3.2 oz)    Height:        Intake/Output Summary (Last 24 hours) at 11/04/2017 1242 Last data filed at 11/04/2017 0600 Gross per 24 hour  Intake 446.5 ml  Output 50 ml  Net 396.5 ml   [employer]  11/03/17 0431 11/03/17 1626 11/04/17 0500  Weight: 96 kg (211 lb 10.3 oz) 95.7 kg (211 lb) 95.8 kg (211 lb 3.2 oz)    Examination:  General exam: Not in  distress Respiratory system: Bibasal decreased breath sound, no wheezing Cardiovascular system: Regular rate rhythm S1-S2 normal.  No pedal edema. Gastrointestinal system: Abdomen soft, nontender.  Bowel sounds positive. Central nervous system: Alert and oriented. No focal neurological deficits. Extremities: Symmetric 5 x 5 power. Psychiatry: Judgement and insight appear normal. Mood & affect appropriate.     Data Reviewed: I have personally reviewed following labs and imaging studies  CBC: Recent Labs  Lab 10/31/17 0422 11/01/17 0451 11/02/17 0412 11/03/17 0345 11/04/17 0415  WBC 12.8* 10.8* 11.7* 10.3 9.2  HGB 8.0* 8.0* 8.0* 8.0* 7.5*  HCT 26.5* 27.3* 27.2* 26.8* 26.1*  MCV 92.0 93.2 92.8 92.7 95.3  PLT 385 402* 422* 415* 920*   Basic Metabolic Panel: Recent Labs  Lab 10/29/17 0414 10/31/17 0422 11/01/17 0451 11/02/17 0412  NA 135  136 136 137 135  K 3.6  3.6 3.5 3.7 4.0  CL 97*  98* 99* 97* 97*  CO2 24  26 23 26 23   GLUCOSE 122*  134* 97 105* 116*  BUN 31*  29* 27* 12 16  CREATININE 3.01*  3.03* 2.55* 1.76* 2.29*  CALCIUM 7.9*  8.1* 8.2* 8.0* 8.1*  PHOS 2.8  2.6 3.6  --   --    GFR: Estimated Creatinine Clearance: 30.8 mL/min (A) (by C-G formula based on SCr of 2.29 mg/dL (H)). Liver Function Tests: Recent Labs  Lab 10/29/17 0414 10/31/17 0422  ALBUMIN 2.3*  2.4* 2.3*   No results for input(s): LIPASE, AMYLASE in the last 168 hours. No results for input(s): AMMONIA in the last 168 hours. Coagulation Profile: Recent Labs  Lab 11/01/17 0451  INR 1.48   Cardiac Enzymes: Recent Labs  Lab 10/29/17 1100 11/03/17 0345  CKTOTAL  --  52  TROPONINI 0.05*  --    BNP (last 3 results) No results for input(s): PROBNP in the last 8760 hours. HbA1C: No results for input(s): HGBA1C in the last 72 hours. CBG: Recent Labs  Lab 11/01/17 1302 11/03/17 1555 11/03/17 1936 11/03/17 2127 11/04/17 0629  GLUCAP 121* 95 119* 133* 130*   Lipid  Profile: No results for input(s): CHOL, HDL, LDLCALC, TRIG, CHOLHDL, LDLDIRECT in the last 72 hours. Thyroid Function Tests: No results for input(s): TSH, T4TOTAL, FREET4, T3FREE, THYROIDAB in the last 72 hours. Anemia Panel: No results for input(s): VITAMINB12, FOLATE, FERRITIN, TIBC, IRON, RETICCTPCT in the last 72 hours. Sepsis Labs: No results for input(s): PROCALCITON, LATICACIDVEN in the last 168 hours.  Recent Results (from the past 240 hour(s))  Surgical PCR screen     Status: None   Collection Time: 11/01/17 12:38 AM  Result Value Ref Range Status   MRSA, PCR NEGATIVE NEGATIVE Final   Staphylococcus aureus NEGATIVE NEGATIVE Final    Comment: (NOTE) The Xpert SA Assay (FDA approved for NASAL specimens in patients 29 years of age and older), is one component of a comprehensive surveillance program. It is not intended to diagnose infection nor to guide or monitor treatment. Performed at [facility], [place] [address], [place], [place] [address]          Radiology Studies: Vas [place] Upper Ext Vein Mapping (pre-op Avf)  Result Date: 11/03/2017 UPPER EXTREMITY VEIN MAPPING History: Pre AVF. Examination Guidelines: A complete evaluation includes B-mode imaging, spectral doppler, color  doppler, and power doppler as needed of all accessible portions of each vessel. Bilateral testing is considered an integral part of a complete examination. Limited examinations for reoccurring indications may be performed as noted. +-----------------+-------------+----------+--------+ Right Cephalic   Diameter (mm)Depth (mm)Findings +-----------------+-------------+----------+--------+ Shoulder             3.90       20.00            +-----------------+-------------+----------+--------+ Prox upper arm       2.50       16.00            +-----------------+-------------+----------+--------+ Mid upper arm        2.50       13.00             +-----------------+-------------+----------+--------+ Dist upper arm       2.60        4.80            +-----------------+-------------+----------+--------+ Antecubital fossa    2.90        3.00            +-----------------+-------------+----------+--------+ Prox forearm         3.20        6.80            +-----------------+-------------+----------+--------+ Mid forearm          2.90        8.00            +-----------------+-------------+----------+--------+ Dist forearm         2.60        9.00            +-----------------+-------------+----------+--------+ Wrist                1.30        2.80            +-----------------+-------------+----------+--------+ +-----------------+-------------+----------+---------+ Left Cephalic    Diameter (mm)Depth (mm)Findings  +-----------------+-------------+----------+---------+ Shoulder             4.70       19.00             +-----------------+-------------+----------+---------+ Prox upper arm       4.90       18.00             +-----------------+-------------+----------+---------+ Mid upper arm        6.60       13.00   branching +-----------------+-------------+----------+---------+ Dist upper arm       5.60       10.00   branching +-----------------+-------------+----------+---------+ Antecubital fossa    5.40        2.60             +-----------------+-------------+----------+---------+ Prox forearm         3.80        8.60   branching +-----------------+-------------+----------+---------+ Mid forearm          4.30       12.00             +-----------------+-------------+----------+---------+ Dist forearm         3.70        3.00             +-----------------+-------------+----------+---------+ Wrist                1.90        4.10             +-----------------+-------------+----------+---------+ Final Interpretation: *See table(s) above for measurements  and observations.  Deitra Mayo Electronically signed by Deitra Mayo on 11/03/2017 at 2:52:07 PM.         Scheduled Meds: . amLODipine  5 mg Oral Daily  . aspirin  81 mg Oral Daily  . calcium acetate  667 mg Oral TID WC  . carvedilol  25 mg Oral BID WC  . darbepoetin (ARANESP) injection - DIALYSIS  100 mcg Intravenous Q Wed-HD  . gabapentin  100 mg Oral Daily  . hydrALAZINE  50 mg Oral Q8H  . isosorbide mononitrate  120 mg Oral Daily  . pantoprazole  40 mg Oral Daily  . polyethylene glycol  17 g Oral Daily  . [START ON 11/12/2017] pravastatin  40 mg Oral q1800  . sodium chloride flush  3 mL Intravenous Q12H   Continuous Infusions: . sodium chloride 10 mL/hr at 11/04/17 0600  . sodium chloride    . cefTRIAXone (ROCEPHIN)  IV 0 g (11/02/17 1849)  . DAPTOmycin (CUBICIN)  IV Stopped (11/02/17 2350)  . heparin 2,800 Units/hr (11/04/17 1107)     LOS: 25 days    Dron Tanna Furry, MD [facility] Pager [phone]  If 7PM-7AM, please contact night-coverage [email or web address] Password [facility] 11/04/2017, 12:42 PM

## 2017-11-04 NOTE — Progress Notes (Signed)
Patient ID: Kristen Berry, female   DOB: 1956-03-20, 62 y.o.   MRN: [id number]  [facility] Progress Note   Assessment/ Plan:   1. Acute congestive heart failure exacerbation: clinically improving however, still with intermittent shortness of breath that is noted during her encounter today as well-will order for hemodialysis today and then again tomorrow. Coronary angiogram showed diffuse disease with no PCI targets and no indications for CABG yet.  2. ESRD: progressive chronic kidney disease compounded by chronic cardiorenal syndrome. I will order for hemodialysis today and then again tomorrow primarily to address her intolerance to volume excess as well as to get her to a Tuesday/Thursday/Saturday schedule for which she has been accepted to the [facility].  Waiting conversion to tunneled hemodialysis catheter as well as placement of permanent dialysis access. 3. Anemia:hemoglobin level low but stable, increased Aranesp dose. No overt loss and will decide on need for PRBCs post-op.  4. CKD-MBD: calcium/phosphorus level within acceptable range, continue calcium acetate with meals. PTH level at goal for ESRD. 5. Left second toe gangrene/cellulitis: underwent attempt of left lower extremity revascularization that was unfortunately negative due to the moderate length SFA occlusion with diminutive proximal SFA-plans noted for possible left femoropopliteal bypass after vein mapping and cardiac clearance.  At that time, she will also have amputation of the left second toe. 6. Hypertension:blood pressure marginally elevated, .  Subjective:   Reports to be feeling somewhat short of breath this morning-denies any chest pain.   Objective:   BP (!) 129/42   Pulse 87   Temp (!) 97.3 F (36.3 C) (Oral)   Resp (!) 22   Ht 5\' 8"  (1.727 m)   Wt 95.8 kg (211 lb 3.2 oz)   SpO2 98%   BMI 32.11 kg/m   Physical Exam: Gen: comfortably resting in bed, unable to speak in full sentences  due to shortness of breath CVS: pulse regular rhythm and normal rate, S1 and S2 normal Resp:. Clear anteriorly, no distinct rales or rhonchi HFW:YOVZ, obese, nontender Ext: trace ankle edema, left forefoot and clean/dry dressing  Labs: BMET Recent Labs  Lab 10/29/17 0414 10/31/17 0422 11/01/17 0451 11/02/17 0412  NA 135  136 136 137 135  K 3.6  3.6 3.5 3.7 4.0  CL 97*  98* 99* 97* 97*  CO2 24  26 23 26 23   GLUCOSE 122*  134* 97 105* 116*  BUN 31*  29* 27* 12 16  CREATININE 3.01*  3.03* 2.55* 1.76* 2.29*  CALCIUM 7.9*  8.1* 8.2* 8.0* 8.1*  PHOS 2.8  2.6 3.6  --   --    CBC Recent Labs  Lab 11/01/17 0451 11/02/17 0412 11/03/17 0345 11/04/17 0415  WBC 10.8* 11.7* 10.3 9.2  HGB 8.0* 8.0* 8.0* 7.5*  HCT 27.3* 27.2* 26.8* 26.1*  MCV 93.2 92.8 92.7 95.3  PLT 402* 422* 415* 408*   Medications:    . aspirin  81 mg Oral Daily  . calcium acetate  667 mg Oral TID WC  . carvedilol  25 mg Oral BID WC  . darbepoetin (ARANESP) injection - DIALYSIS  100 mcg Intravenous Q Wed-HD  . gabapentin  100 mg Oral Daily  . hydrALAZINE  100 mg Oral Q8H  . isosorbide mononitrate  120 mg Oral Daily  . polyethylene glycol  17 g Oral Daily  . [START ON 11/12/2017] pravastatin  40 mg Oral q1800  . sodium chloride flush  3 mL Intravenous Q12H   Elmarie Shiley, MD  11/04/2017, 8:44 AM

## 2017-11-05 LAB — CBC
HCT: 24.9 % — ABNORMAL LOW (ref 36.0–46.0)
Hemoglobin: 7.2 g/dL — ABNORMAL LOW (ref 12.0–15.0)
MCH: 27.4 pg (ref 26.0–34.0)
MCHC: 28.9 g/dL — ABNORMAL LOW (ref 30.0–36.0)
MCV: 94.7 fL (ref 78.0–100.0)
Platelets: 466 10*3/uL — ABNORMAL HIGH (ref 150–400)
RBC: 2.63 MIL/uL — ABNORMAL LOW (ref 3.87–5.11)
RDW: 20.2 % — ABNORMAL HIGH (ref 11.5–15.5)
WBC: 12 10*3/uL — ABNORMAL HIGH (ref 4.0–10.5)

## 2017-11-05 LAB — RENAL FUNCTION PANEL
Albumin: 2.5 g/dL — ABNORMAL LOW (ref 3.5–5.0)
Anion gap: 15 (ref 5–15)
BUN: 31 mg/dL — ABNORMAL HIGH (ref 6–20)
CO2: 22 mmol/L (ref 22–32)
Calcium: 7.9 mg/dL — ABNORMAL LOW (ref 8.9–10.3)
Chloride: 95 mmol/L — ABNORMAL LOW (ref 101–111)
Creatinine, Ser: 4.44 mg/dL — ABNORMAL HIGH (ref 0.44–1.00)
GFR calc Af Amer: 11 mL/min — ABNORMAL LOW (ref >60)
GFR calc non Af Amer: 10 mL/min — ABNORMAL LOW (ref >60)
Glucose, Bld: 152 mg/dL — ABNORMAL HIGH (ref 65–99)
Phosphorus: 5.4 mg/dL — ABNORMAL HIGH (ref 2.5–4.6)
Potassium: 4.1 mmol/L (ref 3.5–5.1)
Sodium: 132 mmol/L — ABNORMAL LOW (ref 135–145)

## 2017-11-05 LAB — HEPARIN LEVEL (UNFRACTIONATED)
Heparin Unfractionated: 1.08 IU/mL — ABNORMAL HIGH (ref 0.30–0.70)
Heparin Unfractionated: >2.2 IU/mL — ABNORMAL HIGH (ref 0.30–0.70)
Heparin Unfractionated: 0.65 IU/mL (ref 0.30–0.70)

## 2017-11-05 MED ORDER — HEPARIN SODIUM (PORCINE) 1000 UNIT/ML DIALYSIS: 40.0000 [IU]/kg | INTRAMUSCULAR | Status: DC | PRN

## 2017-11-05 NOTE — Progress Notes (Signed)
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 Edrick Oh, Nephrology) discharged from Hospitalist service at [facility] on 10/04/17 after 5-day hospital stay for acute osteomyelitis of second left toe with cellulitis and discharged on IV ceftriaxone and daptomycin via PICC line, follow-up creatinine 4.7, associated weakness dyspnea decreased appetite and urine output. Transferred to [facility] due to worsening renal failure, temporary dialysis catheter was placed on 10/19/2016, the patient started dialysis on 10/20/2016, orthopedic and vascular surgery was consulted.  Vascular surgery plans intervention on left lower extremity and dialysis access placement next week.  Assessment & Plan:   #Acute kidney injury on chronic kidney disease stage IV now progressed to ESRD: - Fluid overload initially did not improve with diuretics and started on hemodialysis.  - Still with fluid overload.  Patient underwent HD yesterday and today.  - Awaiting conversion to tunneled HD catheter as well as placement of permanent HD access by vascular surgery next week  - For outpatient HD, patient is accepted at [facility] at 87:56EP  #Acute systolic congestive heart failure/cardiomyopathy:  - EF of 35-40%.   - s/p cath with multi-vessel disease with no target for PCI and no indication for CABG. No chest pain today.  - on ASA, coreg, statin, hydralazine, imdur - Ultrafiltration during dialysis.  - cardiology consult appreciated.  - Statin after completion of daptomycin. -Improved.  #Anemia of chronic kidney disease:  - Continue Aranesp and IV iron during the dialysis.  Monitor CBC.  Hemoglobin in the low 7 range for the last  2 days and may require blood transfusion.  Aranesp dose increased.  #Chest pain/shortness of breath  - likely due to fluid overload and pulmonary edema: No further chest pain reported.  #Left second toe osteomyelitis/peripheral vascular disease:  -s/p attempted left lower extremity revascularization that was unsuccessful and she has a moderate length segment SFA occlusion on the left and a diminutive proximal SFA on 11/03/2017.   -As per vascular surgery follow-up, plan for vascular intervention on left lower extremity including toe amputation for next week. -Remains on IV ceftriaxone and daptomycin.  #Right lower extremity DVT:  - On IV heparin.  Needs oral long-term anticoagulation/oral after the procedure done.  #Essential hypertension:  Monitor blood pressure.  Continue current medication.  Dialysis per nephrology.  Controlled.  #Physical deconditioning: PT OT evaluation.  DVT prophylaxis: heparin Code Status:full code Family Communication: No family at bedside Disposition Plan: Pending further clinical evaluation, improvement    Consultants:   Cardiology  Vascular surgery  Orthopedics  Nephrology  Procedures: HD Rest as above.  Antimicrobials: Rocephin and daptomycin  Subjective: Seen this morning at HD.  Denied complaints.  No dyspnea, chest pain or pain of left foot.  Objective: Vitals:   11/05/17 1045 11/05/17 1100 11/05/17 1130 11/05/17 1155  BP: (!) 144/54 (!) 145/53 (!) 145/57 (!) 124/50  Pulse: 67 69 67 73  Resp:    16  Temp:    97.6 F (36.4 C)  TempSrc:    Oral  SpO2:    98%  Weight:    95 kg (209 lb 7 oz)  Height:        Intake/Output Summary (Last 24 hours) at 11/05/2017 1628 Last data filed at 11/05/2017  1300 Gross per 24 hour  Intake 886 ml  Output 3000 ml  Net -2114 ml   [employer]   11/05/17 0604 11/05/17 0750 11/05/17 1155  Weight: 95.8 kg (211 lb 3.2 oz) 98.3 kg (216 lb 11.4 oz) 95 kg (209 lb 7 oz)    Examination:  General  exam: Middle-aged female, moderately built and nourished lying comfortably supine in bed. Respiratory system: Clear to auscultation.  No increased work of breathing. Cardiovascular system: Regular rate rhythm S1-S2 normal.  No pedal edema.  No JVD or murmurs.  Telemetry: Sinus rhythm with first-degree AV block. Gastrointestinal system: Abdomen soft, nontender.  Bowel sounds positive.  Stable without change. Central nervous system: Alert and oriented. No focal neurological deficits.  Stable without change. Extremities: Symmetric 5 x 5 power.  Left foot dressing clean and dry. Psychiatry: Judgement and insight appear normal. Mood & affect appropriate.     Data Reviewed: I have personally reviewed following labs and imaging studies  CBC: Recent Labs  Lab 11/01/17 0451 11/02/17 0412 11/03/17 0345 11/04/17 0415 11/05/17 0419  WBC 10.8* 11.7* 10.3 9.2 12.0*  HGB 8.0* 8.0* 8.0* 7.5* 7.2*  HCT 27.3* 27.2* 26.8* 26.1* 24.9*  MCV 93.2 92.8 92.7 95.3 94.7  PLT 402* 422* 415* 408* 532*   Basic Metabolic Panel: Recent Labs  Lab 10/31/17 0422 11/01/17 0451 11/02/17 0412 11/05/17 0758  NA 136 137 135 132*  K 3.5 3.7 4.0 4.1  CL 99* 97* 97* 95*  CO2 23 26 23 22   GLUCOSE 97 105* 116* 152*  BUN 27* 12 16 31*  CREATININE 2.55* 1.76* 2.29* 4.44*  CALCIUM 8.2* 8.0* 8.1* 7.9*  PHOS 3.6  --   --  5.4*   GFR: Estimated Creatinine Clearance: 15.8 mL/min (A) (by C-G formula based on SCr of 4.44 mg/dL (H)). Liver Function Tests: Recent Labs  Lab 10/31/17 0422 11/05/17 0758  ALBUMIN 2.3* 2.5*   Coagulation Profile: Recent Labs  Lab 11/01/17 0451  INR 1.48   Cardiac Enzymes: Recent Labs  Lab 11/03/17 0345  CKTOTAL 52   CBG: Recent Labs  Lab 11/01/17 1302 11/03/17 1555 11/03/17 1936 11/03/17 2127 11/04/17 0629  GLUCAP 121* 95 119* 133* 130*     Recent Results (from the past 240 hour(s))  Surgical PCR screen     Status: None   Collection Time: 11/01/17 12:38 AM  Result  Value Ref Range Status   MRSA, PCR NEGATIVE NEGATIVE Final   Staphylococcus aureus NEGATIVE NEGATIVE Final    Comment: (NOTE) The Xpert SA Assay (FDA approved for NASAL specimens in patients 28 years of age and older), is one component of a comprehensive surveillance program. It is not intended to diagnose infection nor to guide or monitor treatment. Performed at [facility], [place] [address] [place], [place] [address]          Radiology Studies:   Scheduled Meds: . amLODipine  5 mg Oral Daily  . aspirin  81 mg Oral Daily  . calcium acetate  667 mg Oral TID WC  . carvedilol  25 mg Oral BID WC  . darbepoetin (ARANESP) injection - DIALYSIS  100 mcg Intravenous Q Wed-HD  . gabapentin  100 mg Oral Daily  . hydrALAZINE  50 mg Oral Q8H  . isosorbide mononitrate  120 mg Oral Daily  . pantoprazole  40 mg Oral Daily  . polyethylene glycol  17 g Oral Daily  . [START ON 11/12/2017] pravastatin  40 mg Oral q1800  .  sodium chloride flush  3 mL Intravenous Q12H   Continuous Infusions: . sodium chloride    . cefTRIAXone (ROCEPHIN)  IV 0 g (11/02/17 1849)  . DAPTOmycin (CUBICIN)  IV Stopped (11/04/17 2124)  . heparin 2,400 Units/hr (11/05/17 1300)     LOS: 26 days    Vernell Leep, MD, FACP, [facility]. [facility] Pager [phone]  If 7PM-7AM, please contact night-coverage [email or web address] Password [facility] 11/05/2017, 4:42 PM

## 2017-11-05 NOTE — Progress Notes (Signed)
  Progress Note    11/05/2017 9:40 AM 2 Days Post-Op  Subjective:  No new issues  Vitals:   11/05/17 0900 11/05/17 0915  BP: (!) 138/50 (!) 131/52  Pulse: 64 64  Resp:    Temp:    SpO2:      Physical Exam: [id number] On hd via temp catheter Left foot dressing cdi  CBC    Component Value Date/Time   WBC 12.0 (H) 11/05/2017 0419   RBC 2.63 (L) 11/05/2017 0419   HGB 7.2 (L) 11/05/2017 0419   HGB 14.3 12/14/2016 1139   HCT 24.9 (L) 11/05/2017 0419   HCT 44.2 12/14/2016 1139   PLT 466 (H) 11/05/2017 0419   PLT 429 (H) 12/14/2016 1139   MCV 94.7 11/05/2017 0419   MCV 88.5 12/14/2016 1139   MCH 27.4 11/05/2017 0419   MCHC 28.9 (L) 11/05/2017 0419   RDW 20.2 (H) 11/05/2017 0419   RDW 17.6 (H) 12/14/2016 1139   LYMPHSABS 0.8 10/26/2017 1200   LYMPHSABS 1.9 12/14/2016 1139   MONOABS 1.9 (H) 10/26/2017 1200   MONOABS 0.7 12/14/2016 1139   EOSABS 0.3 10/26/2017 1200   EOSABS 0.0 10/17/2017 1430   BASOSABS 0.1 10/26/2017 1200   BASOSABS 0.1 12/14/2016 1139    BMET    Component Value Date/Time   NA 132 (L) 11/05/2017 0758   NA 137 12/14/2016 1139   K 4.1 11/05/2017 0758   K 4.2 12/14/2016 1139   CL 95 (L) 11/05/2017 0758   CO2 22 11/05/2017 0758   CO2 20 (L) 12/14/2016 1139   GLUCOSE 152 (H) 11/05/2017 0758   GLUCOSE 115 12/14/2016 1139   BUN 31 (H) 11/05/2017 0758   BUN 32.8 (H) 12/14/2016 1139   CREATININE 4.44 (H) 11/05/2017 0758   CREATININE 2.1 (H) 12/14/2016 1139   CALCIUM 7.9 (L) 11/05/2017 0758   CALCIUM 9.5 12/14/2016 1139   GFRNONAA 10 (L) 11/05/2017 0758   GFRAA 11 (L) 11/05/2017 0758    INR    Component Value Date/Time   INR 1.48 11/01/2017 0451     Intake/Output Summary (Last 24 hours) at 11/05/2017 0940 Last data filed at 11/05/2017 1610 Gross per 24 hour  Intake 766 ml  Output 0 ml  Net 766 ml     Assessment:  62 y.o. female is s/p angiogram with failed antegrade intervention  Plan: Will plan retrograde tibial approach either Monday or  Tuesday in pv lab and then will need either bypass if fails but at least toe amputation, tdc and left arm permanent access. All planned for upcoming week. Would like to give endovascular revascularization another attempt given high risk for surgery.   Deshauna Cayson C. Donzetta Matters, MD [facility] Office: [phone] Pager: [phone]  11/05/2017 9:40 AM

## 2017-11-05 NOTE — Procedures (Signed)
Patient seen on Hemodialysis. QB 400, UF goal 3L Treatment adjusted as needed.  Elmarie Shiley MD [facility]. Office # [phone] Pager # [phone] 8:00 AM

## 2017-11-05 NOTE — Progress Notes (Signed)
[place] for Heparin Indication: DVT  Allergies  Allergen Reactions  . Crestor [Rosuvastatin Calcium] Other (See Comments)    Leg pain   Patient Measurements: Height: 5\' 8"  (172.7 cm) Weight: 209 lb 7 oz (95 kg) IBW/kg (Calculated) : 63.9 Heparin Dosing Weight: 95.7 kg  Assessment: 62 yo F presented on 1/14 with worsening renal function. Treating left toe osteo.  Found to have DVT on doppler and started on IV heparin infusion. Previously had a difficult time getting heparin levels therapeutic. Recently therapeutic on 2800 units/hr.  Level at 0400 was >1, rechecked in HD and >2.2.  Discussed with RN in HD, drawn appropriately per RN and no bleeding was observed. RN in HD instructed to hold infusion and resume post HD. This was not charted on the [facility] so it is unknown exactly when and for how long the infusion was held and restarted.   Heparin level therapeutic: 0.65  Goal of Therapy:  Heparin level 0.3-0.7 units/ml Monitor platelets by anticoagulation protocol: Yes   Plan:  Continue heparin at 2400 units/hr Monitor CBC, heparin level, s/s bleeding  Georga Bora, PharmD Clinical Pharmacist 11/05/2017 8:13 PM

## 2017-11-05 NOTE — Progress Notes (Signed)
[place] for Heparin Indication: DVT  Allergies  Allergen Reactions  . Crestor [Rosuvastatin Calcium] Other (See Comments)    Leg pain   Patient Measurements: Height: 5\' 8"  (172.7 cm) Weight: 216 lb 11.4 oz (98.3 kg) IBW/kg (Calculated) : 63.9 Heparin Dosing Weight: 95.7 kg  Assessment: 62 yo F presented on 1/14 with worsening renal function. Treating left toe osteo.  Found to have DVT on doppler and started on IV heparin infusion. Previously had a difficult time getting heparin levels therapeutic. Recently therapeutic on 2800 units/hr.  Level at 0400 was >1, rechecked in HD and >2.2.  Discussed with RN in HD, drawn appropriately per RN and no bleeding was observed.  RN told to hold heparin gtt for remainder of HD session.    Goal of Therapy:  Heparin level 0.3-0.7 units/ml Monitor platelets by anticoagulation protocol: Yes   Plan:  Hold heparin gtt for remainder of HD today Decrease rate to 2400 units/hr F/u level at 1800 Monitor CBC, heparin level, s/s bleeding  Bertis Ruddy, PharmD Pharmacy Resident Pager #: [phone] 11/05/2017 10:18 AM

## 2017-11-05 NOTE — Progress Notes (Signed)
ANTICOAGULATION CONSULT NOTE - Follow Up Consult  Pharmacy Consult for heparin Indication: DVT   Assessment: This morning's heparin level is supratherapeutic > 1 Patient has been stable at this rate for the past couple days I am wondering if this was drawn incorrectly.   Goal of Therapy:  Heparin level 0.3-0.7 units/ml Monitor platelets by anticoagulation protocol: Yes   Plan:  -Recheck heparin level  Kristen Berry 11/05/2017,5:24 AM

## 2017-11-05 NOTE — Progress Notes (Signed)
Patient ID: Kristen Berry, female   DOB: 1955/11/13, 62 y.o.   MRN: [id number]  [facility] Progress Note   Assessment/ Plan:   1. Acute congestive heart failure exacerbation: clinically improving however, still with intermittent shortness of breath -will continue to challenge dry weight and lower this aggressively. Coronary angiogram showed diffuse disease with no PCI targets and no indications for CABG yet.  2. ESRD: progressive chronic kidney disease compounded by chronic cardiorenal syndrome. Undergoing hemodialysis today and she is to continue a Tuesday/Thursday/Saturday schedule for which she has been accepted to the [facility] kidney Center (first shift).  Awaiting conversion to tunneled hemodialysis catheter as well as placement of permanent dialysis access after reevaluation by cardiology. 3. Anemia:hemoglobin level low but stable, increased Aranesp dose. No overt loss and will decide on need for PRBCs post-op.  4. CKD-MBD: calcium/phosphorus level within acceptable range, continue calcium acetate with meals. PTH level at goal for ESRD. 5. Left second toe gangrene/cellulitis: underwent attempted left lower extremity revascularization that was unfortunately unsuccessful due to the moderate length SFA occlusion with diminutive proximal SFA-plans now are for left femoropopliteal bypass after vein mapping and cardiac clearance.  At that time, she will also undergo amputation of the left second toe. 6. Hypertension:blood pressure well controlled overnight-will continue to monitor for adjustment of antihypertensive therapy.  Subjective:   Denies any events overnight and reports that she had a busy day yesterday. Was unable to get dialysis yesterday due to overwhelming patient census in the hospital.    Objective:   BP (!) 116/53   Pulse 68   Temp 98.3 F (36.8 C) (Oral)   Resp 12   Ht 5\' 8"  (1.727 m)   Wt 98.3 kg (216 lb 11.4 oz)   SpO2 98%   BMI 32.95 kg/m   Physical  Exam: Gen: comfortably resting in hemodialysis CVS: pulse regular rhythm and normal rate, S1 and S2 normal Resp:. Clear anteriorly, no distinct rales or rhonchi YBO:[id number], obese, nontender Ext: trace ankle edema, left forefoot and clean/dry dressing  Labs: BMET Recent Labs  Lab 10/31/17 0422 11/01/17 0451 11/02/17 0412  NA 136 137 135  K 3.5 3.7 4.0  CL 99* 97* 97*  CO2 23 26 23   GLUCOSE 97 105* 116*  BUN 27* 12 16  CREATININE 2.55* 1.76* 2.29*  CALCIUM 8.2* 8.0* 8.1*  PHOS 3.6  --   --    CBC Recent Labs  Lab 11/02/17 0412 11/03/17 0345 11/04/17 0415 11/05/17 0419  WBC 11.7* 10.3 9.2 12.0*  HGB 8.0* 8.0* 7.5* 7.2*  HCT 27.2* 26.8* 26.1* 24.9*  MCV 92.8 92.7 95.3 94.7  PLT 422* 415* 408* 466*   Medications:    . amLODipine  5 mg Oral Daily  . aspirin  81 mg Oral Daily  . calcium acetate  667 mg Oral TID WC  . carvedilol  25 mg Oral BID WC  . darbepoetin (ARANESP) injection - DIALYSIS  100 mcg Intravenous Q Wed-HD  . gabapentin  100 mg Oral Daily  . hydrALAZINE  50 mg Oral Q8H  . isosorbide mononitrate  120 mg Oral Daily  . pantoprazole  40 mg Oral Daily  . polyethylene glycol  17 g Oral Daily  . [START ON 11/12/2017] pravastatin  40 mg Oral q1800  . sodium chloride flush  3 mL Intravenous Q12H   Elmarie Shiley, MD 11/05/2017, 8:16 AM

## 2017-11-06 ENCOUNTER — Inpatient Hospital Stay (HOSPITAL_COMMUNITY): Payer: Medicare HMO

## 2017-11-06 DIAGNOSIS — Z01810 Encounter for preprocedural cardiovascular examination: Secondary | ICD-10-CM

## 2017-11-06 DIAGNOSIS — I739 Peripheral vascular disease, unspecified: Secondary | ICD-10-CM

## 2017-11-06 LAB — CBC
HCT: 24.8 % — ABNORMAL LOW (ref 36.0–46.0)
Hemoglobin: 7.2 g/dL — ABNORMAL LOW (ref 12.0–15.0)
MCH: 27.9 pg (ref 26.0–34.0)
MCHC: 29 g/dL — ABNORMAL LOW (ref 30.0–36.0)
MCV: 96.1 fL (ref 78.0–100.0)
Platelets: 441 10*3/uL — ABNORMAL HIGH (ref 150–400)
RBC: 2.58 MIL/uL — ABNORMAL LOW (ref 3.87–5.11)
RDW: 20.4 % — ABNORMAL HIGH (ref 11.5–15.5)
WBC: 9 10*3/uL (ref 4.0–10.5)

## 2017-11-06 LAB — HEPARIN LEVEL (UNFRACTIONATED): Heparin Unfractionated: 0.68 IU/mL (ref 0.30–0.70)

## 2017-11-06 MED ORDER — RENA-VITE PO TABS
1.0000 | ORAL_TABLET | Freq: Every day | ORAL | Status: DC
  Administered 2017-11-06 – 2017-11-15 (×10): 1 via ORAL
  Filled 2017-11-06 (×10): qty 1

## 2017-11-06 MED ORDER — SODIUM CHLORIDE 0.9 % IV SOLN
680.0000 mg | INTRAVENOUS | Status: DC
  Administered 2017-11-06 – 2017-11-08 (×2): 680 mg via INTRAVENOUS
  Filled 2017-11-06 (×2): qty 13.6

## 2017-11-06 NOTE — Progress Notes (Signed)
  Progress Note    11/06/2017 7:58 AM 3 Days Post-Op  Subjective:  No new complaints  Vitals:   11/05/17 2137 11/06/17 0521  BP: (!) 126/56 (!) 112/40  Pulse: 85 73  Resp: 18 18  Temp: 97.7 F (36.5 C) 98.3 F (36.8 C)  SpO2: 92% 97%    Physical Exam: aaox3 On hd via temp catheter Left foot dressing cdi    CBC    Component Value Date/Time   WBC 9.0 11/06/2017 0447   RBC 2.58 (L) 11/06/2017 0447   HGB 7.2 (L) 11/06/2017 0447   HGB 14.3 12/14/2016 1139   HCT 24.8 (L) 11/06/2017 0447   HCT 44.2 12/14/2016 1139   PLT 441 (H) 11/06/2017 0447   PLT 429 (H) 12/14/2016 1139   MCV 96.1 11/06/2017 0447   MCV 88.5 12/14/2016 1139   MCH 27.9 11/06/2017 0447   MCHC 29.0 (L) 11/06/2017 0447   RDW 20.4 (H) 11/06/2017 0447   RDW 17.6 (H) 12/14/2016 1139   LYMPHSABS 0.8 10/26/2017 1200   LYMPHSABS 1.9 12/14/2016 1139   MONOABS 1.9 (H) 10/26/2017 1200   MONOABS 0.7 12/14/2016 1139   EOSABS 0.3 10/26/2017 1200   EOSABS 0.0 10/17/2017 1430   BASOSABS 0.1 10/26/2017 1200   BASOSABS 0.1 12/14/2016 1139    BMET    Component Value Date/Time   NA 132 (L) 11/05/2017 0758   NA 137 12/14/2016 1139   K 4.1 11/05/2017 0758   K 4.2 12/14/2016 1139   CL 95 (L) 11/05/2017 0758   CO2 22 11/05/2017 0758   CO2 20 (L) 12/14/2016 1139   GLUCOSE 152 (H) 11/05/2017 0758   GLUCOSE 115 12/14/2016 1139   BUN 31 (H) 11/05/2017 0758   BUN 32.8 (H) 12/14/2016 1139   CREATININE 4.44 (H) 11/05/2017 0758   CREATININE 2.1 (H) 12/14/2016 1139   CALCIUM 7.9 (L) 11/05/2017 0758   CALCIUM 9.5 12/14/2016 1139   GFRNONAA 10 (L) 11/05/2017 0758   GFRAA 11 (L) 11/05/2017 0758    INR    Component Value Date/Time   INR 1.48 11/01/2017 0451     Intake/Output Summary (Last 24 hours) at 11/06/2017 0758 Last data filed at 11/06/2017 0521 Gross per 24 hour  Intake 582 ml  Output 3000 ml  Net -2418 ml     Assessment:  62 y.o. female is s/p angiogram with failed antegrade intervention. On  heparin drip for dvt. Will need left arm avf and tunneled catheter.  Plan: Will plan retrograde tibial approach tomorrow in pv lab tomorrow. Other procedures including toe amp, left arm access, and tunneled catheter are pending tomorrow results. Continue heparin drip.    Trystan Akhtar C. Donzetta Matters, MD [facility] Office: [phone] Pager: [phone]  11/06/2017 7:58 AM

## 2017-11-06 NOTE — Progress Notes (Signed)
Right Lower Extremity Vein Map    Right Great Saphenous Vein   Segment Diameter Comment  1. Origin 6.60mm   2. High Thigh 6.59mm   3. Mid Thigh 6.68mm   4. Low Thigh 6.67mm   5. At Knee 6.85mm   6. High Calf 18mm   7. Low Calf 3.55mm   8. Ankle 73mm    mm    mm    mm    Left Lower Extremity Vein Map    Left Great Saphenous Vein   Segment Diameter Comment  1. Origin 7.67mm   2. High Thigh 6.19mm   3. Mid Thigh 6.68mm   4. Low Thigh 5.76mm   5. At Knee 6.46mm   6. High Calf 5.20mm   7. Low Calf 3.9mm   8. Ankle 4.18mm    mm    mm    mm

## 2017-11-06 NOTE — Progress Notes (Signed)
Patient ID: Kristen Berry, female   DOB: 1956-03-11, 62 y.o.   MRN: [id number]  [facility] Progress Note   Assessment/ Plan:   1. Acute congestive heart failure exacerbation: Continues to show improvement with efforts at lowering dry weight/ultrafiltration and hemodialysis. Coronary angiogram showed diffuse disease with no PCI targets and no indications for CABG.  2. ESRD: progressive chronic kidney disease compounded by chronic cardiorenal syndrome. Continue hemodialysis on Tuesday/Thursday/Saturday schedule for which she has been accepted to the [facility] kidney Center (first shift).  Awaiting conversion to tunneled hemodialysis catheter as well as placement of permanent dialysis access possibly after revascularization procedure of left leg. 3. Anemia:hemoglobin level low but stable, increased Aranesp dose. No overt loss and will decide on need for PRBCs post-op.  4. CKD-MBD: calcium/phosphorus level within acceptable range, continue calcium acetate with meals. PTH level at goal for ESRD. 5. Left second toe gangrene/cellulitis: underwent attempted left lower extremity revascularization that was unfortunately unsuccessful due to the moderate length SFA occlusion with diminutive proximal SFA-plans now are for left femoropopliteal bypass along with amputation of the left second toe possibly tomorrow. 6. Hypertension:blood pressure well controlled overnight-will continue to monitor for adjustment of antihypertensive therapy.  Subjective:   Without any acute events overnight, tolerated hemodialysis without problems yesterday.    Objective:   BP (!) 114/50 (BP Location: Left Arm)   Pulse 72   Temp 98 F (36.7 C) (Oral)   Resp 18   Ht 5\' 8"  (1.727 m)   Wt 95 kg (209 lb 7 oz)   SpO2 98%   BMI 31.84 kg/m   Physical Exam: Gen: comfortably resting in a recliner, eating breakfast CVS: pulse regular rhythm and normal rate, S1 and S2 normal Resp:. Clear anteriorly, no distinct rales  or rhonchi PJK:DTOI, obese, nontender Ext: trace ankle edema, left forefoot and clean/dry dressing  Labs: BMET Recent Labs  Lab 10/31/17 0422 11/01/17 0451 11/02/17 0412 11/05/17 0758  NA 136 137 135 132*  K 3.5 3.7 4.0 4.1  CL 99* 97* 97* 95*  CO2 23 26 23 22   GLUCOSE 97 105* 116* 152*  BUN 27* 12 16 31*  CREATININE 2.55* 1.76* 2.29* 4.44*  CALCIUM 8.2* 8.0* 8.1* 7.9*  PHOS 3.6  --   --  5.4*   CBC Recent Labs  Lab 11/03/17 0345 11/04/17 0415 11/05/17 0419 11/06/17 0447  WBC 10.3 9.2 12.0* 9.0  HGB 8.0* 7.5* 7.2* 7.2*  HCT 26.8* 26.1* 24.9* 24.8*  MCV 92.7 95.3 94.7 96.1  PLT 415* 408* 466* 441*   Medications:    . amLODipine  5 mg Oral Daily  . aspirin  81 mg Oral Daily  . calcium acetate  667 mg Oral TID WC  . carvedilol  25 mg Oral BID WC  . darbepoetin (ARANESP) injection - DIALYSIS  100 mcg Intravenous Q Wed-HD  . gabapentin  100 mg Oral Daily  . hydrALAZINE  50 mg Oral Q8H  . isosorbide mononitrate  120 mg Oral Daily  . pantoprazole  40 mg Oral Daily  . polyethylene glycol  17 g Oral Daily  . [START ON 11/12/2017] pravastatin  40 mg Oral q1800  . sodium chloride flush  3 mL Intravenous Q12H   Elmarie Shiley, MD 11/06/2017, 10:32 AM

## 2017-11-06 NOTE — Progress Notes (Signed)
PHARMACY NOTE:  ANTIMICROBIAL RENAL DOSAGE ADJUSTMENT  Current antimicrobial regimen includes a mismatch between antimicrobial dosage and estimated renal function.  As per policy approved by the Pharmacy & Therapeutics and Medical Executive Committees, the antimicrobial dosage will be adjusted accordingly.  Current antimicrobial dosage:  Daptomycin 680mg  IV MWF  Indication: Osteo  Renal Function: [x]      On intermittent HD, scheduled: TTS []      On CRRT    Antimicrobial dosage has been changed to:  Daptomycin 680mg  IV Q48hrs  Additional comments: HD schedule was changed to TTS from MWF which is what the daptomycin dosing was based on. Tolerated HD yesterday (2/9) but daptomycin was not changed to be given post-HD and is removed ~15%. Will adjust daptomycin to give a dose today, then after next two HD sessions to complete therapy.  Thank you for allowing pharmacy to be a part of this patient's care.   Patterson Hammersmith PharmD PGY1 Pharmacy Practice Resident 11/06/2017 10:51 AM Pager: [phone] Phone: [phone]

## 2017-11-06 NOTE — Progress Notes (Signed)
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 Edrick Oh, Nephrology) discharged from Hospitalist service at [facility] on 10/04/17 after 5-day hospital stay for acute osteomyelitis of second left toe with cellulitis and discharged on IV ceftriaxone and daptomycin via PICC line, follow-up creatinine 4.7, associated weakness dyspnea decreased appetite and urine output. Transferred to [facility] due to worsening renal failure, temporary dialysis catheter was placed on 10/19/2016, the patient started dialysis on 10/20/2016, orthopedic and vascular surgery was consulted.  Vascular surgery plans intervention on left lower extremity and dialysis access placement next week.  Assessment & Plan:   #Acute kidney injury on chronic kidney disease stage IV now progressed to ESRD: - Fluid overload initially did not improve with diuretics and started on hemodialysis.  - Still with fluid overload but steadily improving with dialysis and attempts to lower dry weight. - Awaiting conversion to tunneled HD catheter as well as placement of permanent HD/left arm access by vascular surgery next week,  - For outpatient HD, patient is accepted at [facility] at 90:24OX  #Acute systolic congestive heart failure/cardiomyopathy:  - EF of 35-40%.   - s/p cath with multi-vessel disease with no target for PCI and no indication for CABG. No chest pain today.  - on ASA, coreg, statin, hydralazine, imdur - Ultrafiltration during dialysis.  - cardiology consult appreciated.  - Statin after completion of daptomycin. -Improved.  #Anemia of chronic kidney disease:  - Continue Aranesp and IV iron during the dialysis.  Monitor CBC.   Hemoglobin in the low 7 range for the last 2 days and may require blood transfusion.  Aranesp dose increased.  Hemoglobin remained stable in the 7.2 range.  #Chest pain/shortness of breath  - likely due to fluid overload and pulmonary edema: No further chest pain reported.  #Left second toe osteomyelitis/peripheral vascular disease:  -s/p attempted left lower extremity revascularization that was unsuccessful and she has a moderate length segment SFA occlusion on the left and a diminutive proximal SFA on 11/03/2017.   -As per vascular surgery follow-up, plan for vascular intervention on left lower extremity including toe amputation for next week, 2/11. -Remains on IV ceftriaxone and daptomycin.  #Right lower extremity DVT:  - On IV heparin.  Needs oral long-term anticoagulation/oral after the procedure done.  #Essential hypertension:  Monitor blood pressure.  Continue current medication.  Dialysis per nephrology.  Controlled.  #Physical deconditioning: PT OT evaluation.  DVT prophylaxis: heparin Code Status:full code Family Communication: Discussed in detail with patient's 2 sons and extended family at bedside Disposition Plan: Pending further clinical evaluation, improvement    Consultants:   Cardiology  Vascular surgery  Orthopedics  Nephrology  Procedures: HD Rest as above.  Antimicrobials: Rocephin and daptomycin  Subjective: Mild left foot pain, improved after Tylenol.  Denies dyspnea or cough.  Objective: Vitals:   11/05/17 2137 11/06/17 0111 11/06/17 0521 11/06/17 0958  BP: (!) 126/56  (!) 112/40 (!) 114/50  Pulse: 85  73 72  Resp: 18  18 18   Temp: 97.7 F (36.5 C)  98.3 F (36.8 C) 98 F (36.7 C)  TempSrc: Oral  Oral Oral  SpO2: 92%  97% 98%  Weight: 95 kg (209 lb 7 oz) 95 kg (209 lb 7  oz)    Height:        Intake/Output Summary (Last 24 hours) at 11/06/2017 1559 Last data filed at 11/06/2017 1300 Gross per 24 hour  Intake 738 ml  Output 0 ml  Net  738 ml   Filed Weights   11/05/17 1155 11/05/17 2137 11/06/17 0111  Weight: 95 kg (209 lb 7 oz) 95 kg (209 lb 7 oz) 95 kg (209 lb 7 oz)    Examination:  General exam: Middle-aged female, moderately built and nourished sitting up comfortably in chair.  Family at bedside. Respiratory system: Clear to auscultation.  No increased work of breathing.  Stable without change. Cardiovascular system: Regular rate rhythm S1-S2 normal.  No pedal edema.  No JVD or murmurs.  Telemetry: Sinus rhythm with PVCs and BBB morphology.  Stable. Gastrointestinal system: Abdomen soft, nontender.  Bowel sounds positive.  Stable without change.  Stable without change. Central nervous system: Alert and oriented. No focal neurological deficits.  Stable without change. Extremities: Symmetric 5 x 5 power.  Left foot dressing clean and dry. Psychiatry: Judgement and insight appear normal. Mood & affect appropriate.     Data Reviewed: I have personally reviewed following labs and imaging studies  CBC: Recent Labs  Lab 11/02/17 0412 11/03/17 0345 11/04/17 0415 11/05/17 0419 11/06/17 0447  WBC 11.7* 10.3 9.2 12.0* 9.0  HGB 8.0* 8.0* 7.5* 7.2* 7.2*  HCT 27.2* 26.8* 26.1* 24.9* 24.8*  MCV 92.8 92.7 95.3 94.7 96.1  PLT 422* 415* 408* 466* 448*   Basic Metabolic Panel: Recent Labs  Lab 10/31/17 0422 11/01/17 0451 11/02/17 0412 11/05/17 0758  NA 136 137 135 132*  K 3.5 3.7 4.0 4.1  CL 99* 97* 97* 95*  CO2 23 26 23 22   GLUCOSE 97 105* 116* 152*  BUN 27* 12 16 31*  CREATININE 2.55* 1.76* 2.29* 4.44*  CALCIUM 8.2* 8.0* 8.1* 7.9*  PHOS 3.6  --   --  5.4*   GFR: Estimated Creatinine Clearance: 15.8 mL/min (A) (by C-G formula based on SCr of 4.44 mg/dL (H)). Liver Function Tests: Recent Labs  Lab 10/31/17 0422 11/05/17 0758  ALBUMIN 2.3* 2.5*   Coagulation Profile: Recent Labs  Lab 11/01/17 0451  INR 1.48   Cardiac Enzymes: Recent Labs  Lab 11/03/17 0345  CKTOTAL 52   CBG: Recent Labs    Lab 11/01/17 1302 11/03/17 1555 11/03/17 1936 11/03/17 2127 11/04/17 0629  GLUCAP 121* 95 119* 133* 130*     Recent Results (from the past 240 hour(s))  Surgical PCR screen     Status: None   Collection Time: 11/01/17 12:38 AM  Result Value Ref Range Status   MRSA, PCR NEGATIVE NEGATIVE Final   Staphylococcus aureus NEGATIVE NEGATIVE Final    Comment: (NOTE) The Xpert SA Assay (FDA approved for NASAL specimens in patients 58 years of age and older), is one component of a comprehensive surveillance program. It is not intended to diagnose infection nor to guide or monitor treatment. Performed at [facility], [place] [address], [place], [place] [address]          Radiology Studies:   Scheduled Meds: . amLODipine  5 mg Oral Daily  . aspirin  81 mg Oral Daily  . calcium acetate  667 mg Oral TID WC  . carvedilol  25 mg Oral BID WC  . darbepoetin (ARANESP) injection - DIALYSIS  100 mcg Intravenous Q Wed-HD  . gabapentin  100 mg Oral Daily  . hydrALAZINE  50 mg  Oral Q8H  . isosorbide mononitrate  120 mg Oral Daily  . multivitamin  1 tablet Oral QHS  . pantoprazole  40 mg Oral Daily  . polyethylene glycol  17 g Oral Daily  . [START ON 11/12/2017] pravastatin  40 mg Oral q1800  . sodium chloride flush  3 mL Intravenous Q12H   Continuous Infusions: . sodium chloride    . cefTRIAXone (ROCEPHIN)  IV Stopped (11/05/17 1800)  . DAPTOmycin (CUBICIN)  IV    . heparin 2,300 Units/hr (11/06/17 1033)     LOS: 27 days    Vernell Leep, MD, FACP, [facility]. [facility] Pager [phone]  If 7PM-7AM, please contact night-coverage [email or web address] Password [email or web address] 11/06/2017, 3:59 PM

## 2017-11-06 NOTE — Progress Notes (Signed)
[place] for Heparin Indication: DVT  Patient Measurements: Height: 5\' 8"  (172.7 cm) Weight: 209 lb 7 oz (95 kg) IBW/kg (Calculated) : 63.9 Heparin Dosing Weight: 95.7 kg  Assessment: 62 yo F presented on 1/14 with worsening renal function. Treating left toe osteo.  Found to have DVT on doppler and started on IV heparin infusion. Previously had a difficult time getting heparin levels therapeutic.  Heparin level therapeutic x2 after rate adjustments yesterday. On upper end of range and procedures planned for tomorrow, so will make slight dose adjustment to prevent bleeds or supratherapeutic levels. No bleeding currently noted, Hgb 7.2 but stable, PLTs 441.  Goal of Therapy:  Heparin level 0.3-0.7 units/ml Monitor platelets by anticoagulation protocol: Yes   Plan:  Decrease heparin to 2300 units/hr Monitor CBC, daily heparin level, s/sx bleeding    Patterson Hammersmith PharmD PGY1 Pharmacy Practice Resident 11/06/2017 10:08 AM Pager: [phone] Phone: [phone]

## 2017-11-07 ENCOUNTER — Inpatient Hospital Stay (HOSPITAL_COMMUNITY)
Admission: AD | Disposition: A | Discharge status: Home Health | Payer: Self-pay | Source: Ambulatory Visit | Attending: Nephrology

## 2017-11-07 DIAGNOSIS — I998 Other disorder of circulatory system: Secondary | ICD-10-CM

## 2017-11-07 HISTORY — PX: PERIPHERAL VASCULAR INTERVENTION: CATH118257

## 2017-11-07 LAB — HEPARIN LEVEL (UNFRACTIONATED): Heparin Unfractionated: 0.55 IU/mL (ref 0.30–0.70)

## 2017-11-07 LAB — CBC
HCT: 24.8 % — ABNORMAL LOW (ref 36.0–46.0)
Hemoglobin: 7.3 g/dL — ABNORMAL LOW (ref 12.0–15.0)
MCH: 28 pg (ref 26.0–34.0)
MCHC: 29.4 g/dL — ABNORMAL LOW (ref 30.0–36.0)
MCV: 95 fL (ref 78.0–100.0)
Platelets: 399 10*3/uL (ref 150–400)
RBC: 2.61 MIL/uL — ABNORMAL LOW (ref 3.87–5.11)
RDW: 20.6 % — ABNORMAL HIGH (ref 11.5–15.5)
WBC: 7.5 10*3/uL (ref 4.0–10.5)

## 2017-11-07 SURGERY — PERIPHERAL VASCULAR INTERVENTION
Anesthesia: LOCAL

## 2017-11-07 MED ORDER — SODIUM CHLORIDE 0.9 % IV SOLN
250.0000 mL | INTRAVENOUS | Status: DC | PRN
  Administered 2017-11-09: 250 mL via INTRAVENOUS

## 2017-11-07 MED ORDER — SODIUM CHLORIDE 0.9% FLUSH
3.0000 mL | Freq: Two times a day (BID) | INTRAVENOUS | Status: DC
  Administered 2017-11-07 – 2017-11-15 (×11): 3 mL via INTRAVENOUS

## 2017-11-07 MED ORDER — HEPARIN (PORCINE) IN NACL 2-0.9 UNIT/ML-% IJ SOLN
INTRAMUSCULAR | Status: AC | PRN
  Administered 2017-11-07: 1000 mL

## 2017-11-07 MED ORDER — MIDAZOLAM HCL 2 MG/2ML IJ SOLN
INTRAMUSCULAR | Status: DC | PRN
  Administered 2017-11-07: 1 mg via INTRAVENOUS

## 2017-11-07 MED ORDER — SODIUM CHLORIDE 0.9% FLUSH: 3.0000 mL | INTRAVENOUS | Status: DC | PRN

## 2017-11-07 MED ORDER — LIDOCAINE HCL 1 % IJ SOLN
INTRAMUSCULAR | Status: AC
  Filled 2017-11-07: qty 40

## 2017-11-07 MED ORDER — IODIXANOL 320 MG/ML IV SOLN
INTRAVENOUS | Status: DC | PRN
  Administered 2017-11-07: 100 mL via INTRA_ARTERIAL

## 2017-11-07 MED ORDER — HYDRALAZINE HCL 20 MG/ML IJ SOLN: 5.0000 mg | INTRAMUSCULAR | Status: DC | PRN

## 2017-11-07 MED ORDER — HEPARIN SODIUM (PORCINE) 1000 UNIT/ML IJ SOLN
INTRAMUSCULAR | Status: DC | PRN
  Administered 2017-11-07: 8000 [IU] via INTRAVENOUS

## 2017-11-07 MED ORDER — LABETALOL HCL 5 MG/ML IV SOLN: 10.0000 mg | INTRAVENOUS | Status: DC | PRN

## 2017-11-07 MED ORDER — MIDAZOLAM HCL 2 MG/2ML IJ SOLN
INTRAMUSCULAR | Status: AC
  Filled 2017-11-07: qty 2

## 2017-11-07 MED ORDER — CLOPIDOGREL BISULFATE 75 MG PO TABS
75.0000 mg | ORAL_TABLET | Freq: Every day | ORAL | Status: DC
  Administered 2017-11-09 – 2017-11-16 (×7): 75 mg via ORAL
  Filled 2017-11-07 (×7): qty 1

## 2017-11-07 MED ORDER — HEPARIN (PORCINE) IN NACL 100-0.45 UNIT/ML-% IJ SOLN
2250.0000 [IU]/h | INTRAMUSCULAR | Status: DC
Start: 2017-11-08 — End: 2017-11-10
  Administered 2017-11-08 – 2017-11-09 (×4): 2250 [IU]/h via INTRAVENOUS
  Filled 2017-11-07 (×6): qty 250

## 2017-11-07 MED ORDER — ACETAMINOPHEN 325 MG PO TABS
650.0000 mg | ORAL_TABLET | ORAL | Status: DC | PRN
  Administered 2017-11-07 – 2017-11-14 (×4): 650 mg via ORAL
  Filled 2017-11-07 (×4): qty 2

## 2017-11-07 MED ORDER — IOPAMIDOL (ISOVUE-370) INJECTION 76%
INTRAVENOUS | Status: AC
  Filled 2017-11-07: qty 100

## 2017-11-07 MED ORDER — HEPARIN SODIUM (PORCINE) 1000 UNIT/ML IJ SOLN
INTRAMUSCULAR | Status: AC
  Filled 2017-11-07: qty 1

## 2017-11-07 MED ORDER — LIDOCAINE HCL (PF) 1 % IJ SOLN
INTRAMUSCULAR | Status: DC | PRN
  Administered 2017-11-07: 12 mL
  Administered 2017-11-07: 2 mL

## 2017-11-07 MED ORDER — OXYCODONE HCL 5 MG PO TABS
5.0000 mg | ORAL_TABLET | ORAL | Status: DC | PRN
  Administered 2017-11-08 (×2): 5 mg via ORAL
  Administered 2017-11-08 – 2017-11-12 (×2): 10 mg via ORAL
  Filled 2017-11-07: qty 2
  Filled 2017-11-07: qty 1
  Filled 2017-11-07: qty 2

## 2017-11-07 MED ORDER — ONDANSETRON HCL 4 MG/2ML IJ SOLN: 4.0000 mg | Freq: Four times a day (QID) | INTRAMUSCULAR | Status: DC | PRN

## 2017-11-07 MED ORDER — FENTANYL CITRATE (PF) 100 MCG/2ML IJ SOLN
INTRAMUSCULAR | Status: AC
  Filled 2017-11-07: qty 2

## 2017-11-07 MED ORDER — CLOPIDOGREL BISULFATE 300 MG PO TABS
ORAL_TABLET | ORAL | Status: AC
  Filled 2017-11-07: qty 1

## 2017-11-07 MED ORDER — FENTANYL CITRATE (PF) 100 MCG/2ML IJ SOLN
INTRAMUSCULAR | Status: DC | PRN
  Administered 2017-11-07: 50 ug via INTRAVENOUS

## 2017-11-07 MED ORDER — SODIUM CHLORIDE 0.9 % IV SOLN
2.0000 g | INTRAVENOUS | Status: DC
  Administered 2017-11-08 – 2017-11-09 (×2): 2 g via INTRAVENOUS
  Filled 2017-11-07 (×4): qty 20

## 2017-11-07 MED ORDER — CLOPIDOGREL BISULFATE 75 MG PO TABS
300.0000 mg | ORAL_TABLET | Freq: Once | ORAL | Status: AC
  Administered 2017-11-07: 300 mg via ORAL

## 2017-11-07 SURGICAL SUPPLY — 29 items
BALLN ADMIRAL INPACT 5X250 (BALLOONS) ×3
BALLN MUSTANG 5X100X135 (BALLOONS) ×3
BALLN MUSTANG 5X200X135 (BALLOONS) ×3
BALLOON ADMIRAL INPACT 5X250 (BALLOONS) ×2 IMPLANT
BALLOON MUSTANG 5X100X135 (BALLOONS) ×2 IMPLANT
BALLOON MUSTANG 5X200X135 (BALLOONS) ×2 IMPLANT
CATH CXI SUPP ST 2.6FR 150CM (CATHETERS) ×3 IMPLANT
CATH QUICKCROSS .035X135CM (MICROCATHETER) ×3 IMPLANT
CATH TEMPO 5F RIM 65CM (CATHETERS) ×3 IMPLANT
COVER PRB 48X5XTLSCP FOLD TPE (BAG) ×2 IMPLANT
COVER PROBE 5X48 (BAG) ×1
DEVICE ONE SNARE 10MM (MISCELLANEOUS) ×3 IMPLANT
GLIDEWIRE ANGLED SS 035X260CM (WIRE) ×3 IMPLANT
KIT ENCORE 26 ADVANTAGE (KITS) ×3 IMPLANT
KIT PV (KITS) ×3 IMPLANT
SET INTRODUCER MICROPUNCT 5F (INTRODUCER) ×3 IMPLANT
SHEATH HIGHFLEX ANSEL 6FRX55 (SHEATH) ×3 IMPLANT
SHEATH MICROPUNCTURE PEDAL 4FR (SHEATH) ×3 IMPLANT
SHEATH PINNACLE 5F 10CM (SHEATH) ×3 IMPLANT
SHEATH PINNACLE 6F 10CM (SHEATH) ×3 IMPLANT
STENT INNOVA 5X100X130 (Permanent Stent) ×3 IMPLANT
STENT INNOVA 5X120X130 (Permanent Stent) ×3 IMPLANT
SYRINGE MEDRAD AVANTA MACH 7 (SYRINGE) ×3 IMPLANT
TRANSDUCER W/STOPCOCK (MISCELLANEOUS) ×3 IMPLANT
TRAY PV CATH (CUSTOM PROCEDURE TRAY) ×3 IMPLANT
WIRE BENTSON .035X145CM (WIRE) ×3 IMPLANT
WIRE G V18X300CM (WIRE) ×3 IMPLANT
WIRE MICROINTRODUCER 60CM (WIRE) ×3 IMPLANT
WIRE TORQFLEX AUST .018X40CM (WIRE) ×3 IMPLANT

## 2017-11-07 NOTE — Progress Notes (Signed)
Patient ID: DEBI COUSIN, female   DOB: 20-Apr-1956, 62 y.o.   MRN: [id number] S:No complaints  O:BP (!) 148/54 (BP Location: Left Arm)   Pulse 86   Temp 98.7 F (37.1 C) (Oral)   Resp 18   Ht 5\' 8"  (1.727 m)   Wt 95 kg (209 lb 7 oz)   SpO2 96%   BMI 31.84 kg/m   Intake/Output Summary (Last 24 hours) at 11/07/2017 1310 Last data filed at 11/07/2017 0600 Gross per 24 hour  Intake 814.13 ml  Output 0 ml  Net 814.13 ml   Intake/Output: I/O last 3 completed shifts: In: 1528.1 [P.O.:570; I.V.:844.5; IV Piggyback:113.6] Out: 0   Intake/Output this shift:  No intake/output data recorded. Weight change:  Gen:NAD CVS: no rub Resp: cta Abd: benign Ext: trace edema  Recent Labs  Lab 11/01/17 0451 11/02/17 0412 11/05/17 0758  NA 137 135 132*  K 3.7 4.0 4.1  CL 97* 97* 95*  CO2 26 23 22   GLUCOSE 105* 116* 152*  BUN 12 16 31*  CREATININE 1.76* 2.29* 4.44*  ALBUMIN  --   --  2.5*  CALCIUM 8.0* 8.1* 7.9*  PHOS  --   --  5.4*   Liver Function Tests: Recent Labs  Lab 11/05/17 0758  ALBUMIN 2.5*   No results for input(s): LIPASE, AMYLASE in the last 168 hours. No results for input(s): AMMONIA in the last 168 hours. CBC: Recent Labs  Lab 11/03/17 0345 11/04/17 0415 11/05/17 0419 11/06/17 0447 11/07/17 0337  WBC 10.3 9.2 12.0* 9.0 7.5  HGB 8.0* 7.5* 7.2* 7.2* 7.3*  HCT 26.8* 26.1* 24.9* 24.8* 24.8*  MCV 92.7 95.3 94.7 96.1 95.0  PLT 415* 408* 466* 441* 399   Cardiac Enzymes: Recent Labs  Lab 11/03/17 0345  CKTOTAL 52   CBG: Recent Labs  Lab 11/01/17 1302 11/03/17 1555 11/03/17 1936 11/03/17 2127 11/04/17 0629  GLUCAP 121* 95 119* 133* 130*    Iron Studies: No results for input(s): IRON, TIBC, TRANSFERRIN, FERRITIN in the last 72 hours. Studies/Results:  . amLODipine  5 mg Oral Daily  . aspirin  81 mg Oral Daily  . calcium acetate  667 mg Oral TID WC  . carvedilol  25 mg Oral BID WC  . darbepoetin (ARANESP) injection - DIALYSIS  100 mcg Intravenous  Q Wed-HD  . gabapentin  100 mg Oral Daily  . hydrALAZINE  50 mg Oral Q8H  . isosorbide mononitrate  120 mg Oral Daily  . multivitamin  1 tablet Oral QHS  . pantoprazole  40 mg Oral Daily  . polyethylene glycol  17 g Oral Daily  . [START ON 11/12/2017] pravastatin  40 mg Oral q1800  . sodium chloride flush  3 mL Intravenous Q12H    BMET    Component Value Date/Time   NA 132 (L) 11/05/2017 0758   NA 137 12/14/2016 1139   K 4.1 11/05/2017 0758   K 4.2 12/14/2016 1139   CL 95 (L) 11/05/2017 0758   CO2 22 11/05/2017 0758   CO2 20 (L) 12/14/2016 1139   GLUCOSE 152 (H) 11/05/2017 0758   GLUCOSE 115 12/14/2016 1139   BUN 31 (H) 11/05/2017 0758   BUN 32.8 (H) 12/14/2016 1139   CREATININE 4.44 (H) 11/05/2017 0758   CREATININE 2.1 (H) 12/14/2016 1139   CALCIUM 7.9 (L) 11/05/2017 0758   CALCIUM 9.5 12/14/2016 1139   GFRNONAA 10 (L) 11/05/2017 0758   GFRAA 11 (L) 11/05/2017 0758   CBC  Component Value Date/Time   WBC 7.5 11/07/2017 0337   RBC 2.61 (L) 11/07/2017 0337   HGB 7.3 (L) 11/07/2017 0337   HGB 14.3 12/14/2016 1139   HCT 24.8 (L) 11/07/2017 0337   HCT 44.2 12/14/2016 1139   PLT 399 11/07/2017 0337   PLT 429 (H) 12/14/2016 1139   MCV 95.0 11/07/2017 0337   MCV 88.5 12/14/2016 1139   MCH 28.0 11/07/2017 0337   MCHC 29.4 (L) 11/07/2017 0337   RDW 20.6 (H) 11/07/2017 0337   RDW 17.6 (H) 12/14/2016 1139   LYMPHSABS 0.8 10/26/2017 1200   LYMPHSABS 1.9 12/14/2016 1139   MONOABS 1.9 (H) 10/26/2017 1200   MONOABS 0.7 12/14/2016 1139   EOSABS 0.3 10/26/2017 1200   EOSABS 0.0 10/17/2017 1430   BASOSABS 0.1 10/26/2017 1200   BASOSABS 0.1 12/14/2016 1139     Assessment/Plan:  1. ESRD- progressive CKD complicated by cardiorenal syndrome.  Has been responding well to IHD and is due for another session tomorrow.  Has TTS schedule first shift at [facility] once stable for discharge. 2. Acute on chronic CHF- improved with HD and UF.   3. CAD- diffuse disease with no PCI  targets 4. Anemia of ESRD- cont with ESA and follow 5. CKD-MBD- stable 6. PVD- left second toe gangrene/cellulitis for intervention by VVS this week. 7. HTN- stable 8. Vascular access- will need conversion to tunneled HD catheter and placement of new dialysis access, hopefully after revascularization of left leg.  Donetta Potts, MD [employer] [phone]

## 2017-11-07 NOTE — Op Note (Signed)
Patient name: Kristen Berry MRN: [id number] DOB: 04-Aug-1956 Sex: female  11/07/2017 Pre-operative Diagnosis: critical left lower extremity ischemia Post-operative diagnosis:  Same Surgeon:  Erlene Quan C. Donzetta Matters, MD Procedure Performed: 1.  US guided cannulation of right common femoral artery 2.  US guided cannulation of left anterior tibial artery 3.  Left lower extremity angiogram 4.  Drug coated balloon angioplasty of left sfa with 5 x 235mm in.pact 5.  Stent of left sfa with 5x147mm and 5x146mm innova stents 6.  Moderate sedation with fentanyl and versed for 97 minutes 7.  Attempted percutaneous closure with mynx  Indications: 62 year old female has now history of end-stage renal disease and a gangrenous toe.  She had an attempted antegrade intervention in the operating room that failed.  She is now indicated for retrograde and antegrade intervention to reestablish blood flow in her left lower extremity.  Findings: Anterior tibial artery was large and easily cannulated.  We were able to get retrograde through the occlusion we were able to snare but unfortunately were unable to pull the wire through.  With this we were then able to antegrade treat the SFA.  After balloon angioplasty with drug-coated balloon there was a dissection that was flow-limiting for which we stented with two 5 mm stents.  At completion there was a palpable pulse at the anterior tibial at the level of the ankle.  Unfortunately the closure device in the right common femoral artery failed.   Procedure:  The patient was identified in the holding area and taken to room 7.  She was placed supine and sterilely prepped and draped in her right groin and left foot.  Timeout was called.  We first used ultrasound guidance to cannulate the right common femoral artery with micropuncture needle followed by wire sheath.  Bentson wire was placed and a 5 French sheath was placed and flushed.  We then turned our attention to the left ankle.   We used a micropuncture needle to cannulate the anterior tibial artery which did take 2 attempts at the level of the ankle under ultrasound guidance.  We were able to pass a wire followed by micropuncture sheath.  We then used the V 18 wire to get up to the level of the SFA.  Patient is done with heparinized with 10,000 units of heparin.  We then used an rim catheter to go up and over the bifurcation of the aorta with a Glidewire and placed a long 6 French sheath into the SFA.  We then used a CXI catheter and V 18 wire retrograde to cross the occluded SFA.  From above we then were able to snare this wire.  Unfortunately and tried to remove the snare wire we lost control of it.  We were then able to direct thankfully a Glidewire antegrade down to where the V 18 wire was and we then confirmed intraluminal access.  We performed balloon angioplasty of the SFA with 5 mm balloon followed by drug-coated balloon angioplasty of the entire SFA back to the takeoff.  There was residual dissection at the takeoff of the SFA.  We used a 5 mm balloon at low inflation for 3 minutes to try to tack this but angiogram still demonstrated dissection.  With this we then deployed a 5 x 100 mm stent and postdilated this.  We still had residual dissection distally and deployed a 5 x 120 mm stent there.  This was also postdilated with a 5 mm balloon.  With this completion  angiogram demonstrated no further residual dissection or flow-limiting stenosis where once it had been occluded.  We had a palpable pulse of the anterior tibial at the level of the ankle.  Satisfied with this we exchanged for a short sheath in the right common femoral artery which was 6 [place].  We then attempted to play a minx but it failed.  Pressure was held for 20 minutes and hemostasis was obtained.  Patient tolerated procedure well without immediate complication.  She will still need toe amputation on the left side  Contrast: 100cc   [name] C. Donzetta Matters,  MD [facility] Office: [phone] Pager: [phone]

## 2017-11-07 NOTE — Progress Notes (Signed)
Pt arrived to room 4e03 at this time.  No c/o pain.  No s/s of any acute distress.  Right groin is level 0

## 2017-11-07 NOTE — Progress Notes (Signed)
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 Edrick Oh, Nephrology) discharged from Hospitalist service at [place] on 10/04/17 after 5-day hospital stay for acute osteomyelitis of second left toe with cellulitis and discharged on IV ceftriaxone and daptomycin via PICC line, follow-up creatinine 4.7, associated weakness dyspnea decreased appetite and urine output. Transferred to [facility] due to worsening renal failure, temporary dialysis catheter was placed on 10/19/2016, the patient started dialysis on 10/20/2016, orthopedic and vascular surgery was consulted.  Vascular surgery plans retrograde angiogram and stenting of left SFA on 2/11 and other procedures including toe amputation, left arm AV access and tunneled catheter later.  Assessment & Plan:   #Acute kidney injury on chronic kidney disease stage IV now progressed to ESRD: - Fluid overload initially did not improve with diuretics and started on hemodialysis.  - Still with fluid overload but steadily improving with dialysis and attempts to lower dry weight. - Awaiting conversion to tunneled HD catheter as well as placement of permanent HD/left arm access by vascular surgery hopefully sometime this week.  - For outpatient HD, patient is accepted at [facility] at 06:35am - Nephrology following for dialysis needs.  #Acute systolic congestive heart failure/cardiomyopathy:  - EF of 35-40%.   - s/p cath with multi-vessel disease with no target for PCI and no indication for CABG. No chest pain today.  - on ASA, coreg, statin, hydralazine, imdur - Ultrafiltration during dialysis.  - cardiology consult appreciated.  - Statin after completion of  daptomycin. -Improved and stable.  #Anemia of chronic kidney disease:  - Continue Aranesp and IV iron during the dialysis.  Monitor CBC.  Hemoglobin in the low 7 range for the last 3 days and may require blood transfusion after procedures.  Aranesp dose increased.  Follow CBC in a.m across dialysis and transfuse across dialysis if hemoglobin 7 or less.  #Chest pain/shortness of breath  - likely due to fluid overload and pulmonary edema: No further chest pain reported.  #Left second toe osteomyelitis/peripheral vascular disease:  -s/p attempted left lower extremity revascularization that was unsuccessful and she has a moderate length segment SFA occlusion on the left and a diminutive proximal SFA on 11/03/2017.   -Remains on IV ceftriaxone and daptomycin. -Vascular surgery plans retrograde angiogram and stenting of left SFA this afternoon and based on result subsequent toe amputation.  #Right lower extremity DVT:  - On IV heparin.  Needs oral long-term anticoagulation/oral after the procedure done.  #Essential hypertension:  Continue current medication.  Dialysis per nephrology.  Controlled.  #Physical deconditioning: PT OT evaluation >recommend home health PT and OT at discharge..  DVT prophylaxis: heparin Code Status:full code Family Communication: None at bedside today. Disposition Plan: Pending further clinical evaluation, improvement    Consultants:   Cardiology  Vascular surgery  Orthopedics  Nephrology  Procedures: HD Rest as above.  Antimicrobials: Rocephin and daptomycin  Subjective: Seen this morning prior to procedure.  Had just finished working with PT and OT.  Denied complaints.  No pain reported.  Denied dyspnea or chest pain.  Objective: Vitals:   11/06/17 2220 11/07/17 0439 11/07/17 1009 11/07/17 1551  BP: (!) 124/49 (!) 133/42 (!) 148/54   Pulse: 72 73 86   Resp: 18  18 18   Temp: 98.7 F (37.1 C) 98.2 F (36.8 C) 98.7 F (37.1 C)   TempSrc: Oral  Oral Oral   SpO2: 93% 98% 96% (!) 26%  Weight:      Height:        Intake/Output Summary (Last 24 hours) at 11/07/2017 1615 Last data filed at 11/07/2017 0600 Gross per 24 hour  Intake 721.6 ml  Output 0 ml  Net 721.6 ml   Filed Weights   11/05/17 1155 11/05/17 2137 11/06/17 0111  Weight: 95 kg (209 lb 7 oz) 95 kg (209 lb 7 oz) 95 kg (209 lb 7 oz)    Examination:  General exam: Middle-aged female, moderately built and nourished lying comfortably propped up in bed.  Respiratory system: Clear to auscultation.  No increased work of breathing.  Stable without change. Cardiovascular system: Regular rate rhythm S1-S2 normal.  No pedal edema.  No JVD or murmurs.  Telemetry personally reviewed: Sinus rhythm with BBB morphology. Gastrointestinal system: Abdomen soft, nontender.  Bowel sounds positive.  Stable without change. Central nervous system: Alert and oriented. No focal neurological deficits.  Stable without change. Extremities: Symmetric 5 x 5 power.  Left foot dressing clean and dry. Psychiatry: Judgement and insight appear normal. Mood & affect appropriate.     Data Reviewed: I have personally reviewed following labs and imaging studies  CBC: Recent Labs  Lab 11/03/17 0345 11/04/17 0415 11/05/17 0419 11/06/17 0447 11/07/17 0337  WBC 10.3 9.2 12.0* 9.0 7.5  HGB 8.0* 7.5* 7.2* 7.2* 7.3*  HCT 26.8* 26.1* 24.9* 24.8* 24.8*  MCV 92.7 95.3 94.7 96.1 95.0  PLT 415* 408* 466* 441* 517   Basic Metabolic Panel: Recent Labs  Lab 11/01/17 0451 11/02/17 0412 11/05/17 0758  NA 137 135 132*  K 3.7 4.0 4.1  CL 97* 97* 95*  CO2 26 23 22   GLUCOSE 105* 116* 152*  BUN 12 16 31*  CREATININE 1.76* 2.29* 4.44*  CALCIUM 8.0* 8.1* 7.9*  PHOS  --   --  5.4*   GFR: Estimated Creatinine Clearance: 15.8 mL/min (A) (by C-G formula based on SCr of 4.44 mg/dL (H)). Liver Function Tests: Recent Labs  Lab 11/05/17 0758  ALBUMIN 2.5*   Coagulation Profile: Recent Labs  Lab  11/01/17 0451  INR 1.48   Cardiac Enzymes: Recent Labs  Lab 11/03/17 0345  CKTOTAL 52   CBG: Recent Labs  Lab 11/01/17 1302 11/03/17 1555 11/03/17 1936 11/03/17 2127 11/04/17 0629  GLUCAP 121* 95 119* 133* 130*     Recent Results (from the past 240 hour(s))  Surgical PCR screen     Status: None   Collection Time: 11/01/17 12:38 AM  Result Value Ref Range Status   MRSA, PCR NEGATIVE NEGATIVE Final   Staphylococcus aureus NEGATIVE NEGATIVE Final    Comment: (NOTE) The Xpert SA Assay (FDA approved for NASAL specimens in patients 42 years of age and older), is one component of a comprehensive surveillance program. It is not intended to diagnose infection nor to guide or monitor treatment. Performed at [facility], [place] [address] [place], [place] [address]          Radiology Studies:   Scheduled Meds: . [MAR Hold] amLODipine  5 mg Oral Daily  . [MAR Hold] aspirin  81 mg Oral Daily  . [MAR Hold] calcium acetate  667 mg Oral TID WC  . [MAR Hold] carvedilol  25 mg Oral BID WC  . [MAR Hold] darbepoetin (ARANESP) injection -  DIALYSIS  100 mcg Intravenous Q Wed-HD  . [MAR Hold] gabapentin  100 mg Oral Daily  . [MAR Hold] hydrALAZINE  50 mg Oral Q8H  . [MAR Hold] isosorbide mononitrate  120 mg Oral Daily  . [MAR Hold] multivitamin  1 tablet Oral QHS  . [MAR Hold] pantoprazole  40 mg Oral Daily  . [MAR Hold] polyethylene glycol  17 g Oral Daily  . [MAR Hold] pravastatin  40 mg Oral q1800  . [MAR Hold] sodium chloride flush  3 mL Intravenous Q12H   Continuous Infusions: . [MAR Hold] sodium chloride    . [MAR Hold] cefTRIAXone (ROCEPHIN)  IV    . [MAR Hold] DAPTOmycin (CUBICIN)  IV Stopped (11/06/17 2137)  . heparin 2,300 Units/hr (11/07/17 0618)  . heparin       LOS: 28 days    Vernell Leep, MD, FACP, [facility]. [facility] Pager [phone]  If 7PM-7AM, please contact night-coverage [email or web address] Password [facility] 11/07/2017, 4:15 PM

## 2017-11-07 NOTE — Progress Notes (Signed)
Physical Therapy Treatment Patient Details Name: Kristen Berry MRN: [id number] DOB: 11-20-1955 Today's Date: 11/07/2017    History of Present Illness Patient is a 62 year old female who presented to the hospital with shortness of breath. She was found to have an acute kidney injury. PMH: PVD, HTN, CHF CKD, OA     PT Comments    Pt admitted with above diagnosis. Pt currently with functional limitations due to the deficits listed below (see PT Problem List). Pt was able to ambulate in room with cues for staying close to RW.  Pt met 2/3 goals and partially met the 3rd goal.  Revised goals today.  Will continue to progress pt as able.   Pt will benefit from skilled PT to increase their independence and safety with mobility to allow discharge to the venue listed below.     Follow Up Recommendations  Home health PT     Equipment Recommendations  Rolling walker with 5" wheels    Recommendations for Other Services       Precautions / Restrictions Precautions Precautions: Fall Restrictions Weight Bearing Restrictions: No    Mobility  Bed Mobility Overal bed mobility: Needs Assistance Bed Mobility: Supine to Sit     Supine to sit: Modified independent (Device/Increase time)        Transfers Overall transfer level: Needs assistance Equipment used: Rolling walker (2 wheeled) Transfers: Sit to/from Stand Sit to Stand: Min assist         General transfer comment: assist to rise, use of momentum to stand   Ambulation/Gait Ambulation/Gait assistance: Min guard Ambulation Distance (Feet): 40 Feet Assistive device: Rolling walker (2 wheeled) Gait Pattern/deviations: Step-through pattern;Decreased stride length;Trunk flexed Gait velocity: Decreased Gait velocity interpretation: Below normal speed for age/gender General Gait Details: cues for getting closer to the RW and postural checks   Stairs            Wheelchair Mobility    Modified Rankin (Stroke Patients  Only)       Balance Overall balance assessment: Needs assistance Sitting-balance support: Feet supported;No upper extremity supported Sitting balance-Leahy Scale: Good     Standing balance support: Bilateral upper extremity supported;During functional activity;No upper extremity supported Standing balance-Leahy Scale: Poor Standing balance comment: Relies on UEs for support                            Cognition Arousal/Alertness: Awake/alert Behavior During Therapy: WFL for tasks assessed/performed Overall Cognitive Status: Within Functional Limits for tasks assessed                                        Exercises General Exercises - Lower Extremity Ankle Circles/Pumps: AROM;Both;10 reps Quad Sets: AROM;Strengthening;Both;10 reps Heel Slides: AROM;Both;10 reps Hip ABduction/ADduction: AROM;Both;10 reps    General Comments        Pertinent Vitals/Pain Pain Assessment: No/denies pain   VSS Home Living                      Prior Function            PT Goals (current goals can now be found in the care plan section) Acute Rehab PT Goals Patient Stated Goal: Feel stronger  PT Goal Formulation: With patient Time For Goal Achievement: 11/21/17 Potential to Achieve Goals: Good Progress towards PT goals: Progressing toward goals  Frequency    Min 3X/week      PT Plan Current plan remains appropriate    Co-evaluation              AM-PAC PT "6 Clicks" Daily Activity  Outcome Measure  Difficulty turning over in bed (including adjusting bedclothes, sheets and blankets)?: None Difficulty moving from lying on back to sitting on the side of the bed? : None Difficulty sitting down on and standing up from a chair with arms (e.g., wheelchair, bedside commode, etc,.)?: A Little Help needed moving to and from a bed to chair (including a wheelchair)?: A Little Help needed walking in hospital room?: A Little Help needed  climbing 3-5 steps with a railing? : A Little 6 Click Score: 20    End of Session Equipment Utilized During Treatment: Oxygen;Gait belt Activity Tolerance: Patient limited by fatigue Patient left: with call bell/phone within reach;in chair;with chair alarm set;with family/visitor present Nurse Communication: Mobility status PT Visit Diagnosis: Difficulty in walking, not elsewhere classified (R26.2);Other abnormalities of gait and mobility (R26.89)     Time: 2863-8177 PT Time Calculation (min) (ACUTE ONLY): 16 min  Charges:  $Gait Training: 8-22 mins                    G Codes:       [place] [phone] (pager)    Denice Paradise 11/07/2017, 12:11 PM

## 2017-11-07 NOTE — Progress Notes (Signed)
Occupational Therapy Treatment Patient Details Name: Kristen Berry MRN: [id number] DOB: 02/27/1956 Today's Date: 11/07/2017    History of present illness Patient is a 61 year old female who presented to the hospital with shortness of breath. She was found to have an acute kidney injury. PMH: PVD, HTN, CHF CKD, OA    OT comments  Pt in good spirits with her son and grandson from out of state visiting. Pt demonstrating improved ability to stand for ADL at sink. Performed toileting on BSC with min guard assist. Declined bathing and dressing with OT, wants to do closer to her scheduled procedure at 4 pm today. Will continue to follow.  Follow Up Recommendations  Home health OT;Supervision/Assistance - 24 hour    Equipment Recommendations  None recommended by OT    Recommendations for Other Services      Precautions / Restrictions Precautions Precautions: Fall Restrictions Weight Bearing Restrictions: No       Mobility Bed Mobility      General bed mobility comments: pt received in chair  Transfers Overall transfer level: Needs assistance Equipment used: Rolling walker (2 wheeled) Transfers: Sit to/from Stand Sit to Stand: Min guard         General transfer comment: increased time, heavy use of UEs and momentum to stand from chair and 3 in 1    Balance Overall balance assessment: Needs assistance Sitting-balance support: Feet supported;No upper extremity supported Sitting balance-Leahy Scale: Good     Standing balance support: Bilateral upper extremity supported;During functional activity;No upper extremity supported Standing balance-Leahy Scale: Poor Standing balance comment: leans on sink or uses one hand support in static standing                           ADL either performed or assessed with clinical judgement   ADL Overall ADL's : Needs assistance/impaired     Grooming: Oral care;Wash/dry hands;Wash/dry face;Standing;Min guard Grooming Details  (indicate cue type and reason): pt tolerated 3 activities in standing         Upper Body Dressing : Set up;Sitting       Toilet Transfer: Min guard;RW;Ambulation;BSC   Toileting- Clothing Manipulation and Hygiene: Min guard;Sit to/from stand       Functional mobility during ADLs: Min guard;Rolling walker General ADL Comments: Pt with bright affect, son and grandson from [place] visiting.     Vision       Perception     Praxis      Cognition Arousal/Alertness: Awake/alert Behavior During Therapy: WFL for tasks assessed/performed Overall Cognitive Status: Within Functional Limits for tasks assessed                                          Exercises    Shoulder Instructions       General Comments      Pertinent Vitals/ Pain       Pain Assessment: No/denies pain  Home Living                                          Prior Functioning/Environment              Frequency  Min 2X/week        Progress Toward Goals  OT Goals(current  goals can now be found in the care plan section)  Progress towards OT goals: Progressing toward goals  Acute Rehab OT Goals Patient Stated Goal: Feel stronger  OT Goal Formulation: With patient Time For Goal Achievement: 11/17/17 Potential to Achieve Goals: Good  Plan Discharge plan remains appropriate    Co-evaluation                 AM-PAC PT "6 Clicks" Daily Activity     Outcome Measure   Help from another person eating meals?: None Help from another person taking care of personal grooming?: A Little Help from another person toileting, which includes using toliet, bedpan, or urinal?: A Little Help from another person bathing (including washing, rinsing, drying)?: A Little Help from another person to put on and taking off regular upper body clothing?: None Help from another person to put on and taking off regular lower body clothing?: A Little 6 Click Score: 20     End of Session Equipment Utilized During Treatment: Oxygen;Gait belt;Rolling walker  OT Visit Diagnosis: Other abnormalities of gait and mobility (R26.89)   Activity Tolerance Patient tolerated treatment well   Patient Left in chair;with call bell/phone within reach   Nurse Communication          Time: 2446-2863 OT Time Calculation (min): 20 min  Charges: OT General Charges $OT Visit: 1 Visit OT Treatments $Self Care/Home Management : 8-22 mins  11/07/2017 Nestor Lewandowsky, OTR/L Pager: [place], Kristen Berry 11/07/2017, 1:34 PM

## 2017-11-07 NOTE — Progress Notes (Signed)
  Progress Note    11/07/2017 3:21 PM 4 Days Post-Op  Subjective: no new issues  Vitals:   11/07/17 0439 11/07/17 1009  BP: (!) 133/42 (!) 148/54  Pulse: 73 86  Resp: 18 18  Temp: 98.2 F (36.8 C) 98.7 F (37.1 C)  SpO2: 98% 96%    Physical Exam: aaox3 Non labored respirations Left foot dressing cdi   CBC    Component Value Date/Time   WBC 7.5 11/07/2017 0337   RBC 2.61 (L) 11/07/2017 0337   HGB 7.3 (L) 11/07/2017 0337   HGB 14.3 12/14/2016 1139   HCT 24.8 (L) 11/07/2017 0337   HCT 44.2 12/14/2016 1139   PLT 399 11/07/2017 0337   PLT 429 (H) 12/14/2016 1139   MCV 95.0 11/07/2017 0337   MCV 88.5 12/14/2016 1139   MCH 28.0 11/07/2017 0337   MCHC 29.4 (L) 11/07/2017 0337   RDW 20.6 (H) 11/07/2017 0337   RDW 17.6 (H) 12/14/2016 1139   LYMPHSABS 0.8 10/26/2017 1200   LYMPHSABS 1.9 12/14/2016 1139   MONOABS 1.9 (H) 10/26/2017 1200   MONOABS 0.7 12/14/2016 1139   EOSABS 0.3 10/26/2017 1200   EOSABS 0.0 10/17/2017 1430   BASOSABS 0.1 10/26/2017 1200   BASOSABS 0.1 12/14/2016 1139    BMET    Component Value Date/Time   NA 132 (L) 11/05/2017 0758   NA 137 12/14/2016 1139   K 4.1 11/05/2017 0758   K 4.2 12/14/2016 1139   CL 95 (L) 11/05/2017 0758   CO2 22 11/05/2017 0758   CO2 20 (L) 12/14/2016 1139   GLUCOSE 152 (H) 11/05/2017 0758   GLUCOSE 115 12/14/2016 1139   BUN 31 (H) 11/05/2017 0758   BUN 32.8 (H) 12/14/2016 1139   CREATININE 4.44 (H) 11/05/2017 0758   CREATININE 2.1 (H) 12/14/2016 1139   CALCIUM 7.9 (L) 11/05/2017 0758   CALCIUM 9.5 12/14/2016 1139   GFRNONAA 10 (L) 11/05/2017 0758   GFRAA 11 (L) 11/05/2017 0758    INR    Component Value Date/Time   INR 1.48 11/01/2017 0451     Intake/Output Summary (Last 24 hours) at 11/07/2017 1521 Last data filed at 11/07/2017 0600 Gross per 24 hour  Intake 721.6 ml  Output 0 ml  Net 721.6 ml     Assessment:61 y.o.femaleis s/p angiogram with failed antegrade intervention. On heparin drip  for dvt. Will need left arm avf and tunneled catheter.  Plan: pv lab today for attempted retrograde angiogram and stenting of left sfa Other procedures including toe amp, left arm access, and tunneled catheter are pending    Abdias Hickam C. Donzetta Matters, MD [facility] Office: [phone] Pager: [phone]  11/07/2017 3:21 PM

## 2017-11-07 NOTE — Progress Notes (Signed)
[place] for Heparin Indication: DVT  Patient Measurements: Height: 5\' 8"  (172.7 cm) Weight: 209 lb 7 oz (95 kg) IBW/kg (Calculated) : 63.9 Heparin Dosing Weight: 95.7 kg  Assessment: 62 yo F presented on 1/14 with worsening renal function. Treating left toe osteo.  Found to have DVT on doppler and started on IV heparin infusion.   Heparin level therapeutic on 2300 units/hr.  No bleeding currently noted, Hgb 7.2 but stable, PLTs 441.  Will follow-up after procedures today  Goal of Therapy:  Heparin level 0.3-0.7 units/ml Monitor platelets by anticoagulation protocol: Yes   Plan:  Decrease heparin to 2300 units/hr Monitor CBC, daily heparin level, s/sx bleeding    [employer], Pharm.D., BCPS Clinical Pharmacist Pager: [phone] Clinical phone for 11/07/2017 from 8:30-4:00 is [phone]. After 4pm, please call Main Rx [phone]) for assistance. 11/07/2017 10:13 AM

## 2017-11-07 NOTE — Progress Notes (Signed)
[place] for Heparin Indication: DVT  Patient Measurements: Height: 5\' 8"  (172.7 cm) Weight: 209 lb 7 oz (95 kg) IBW/kg (Calculated) : 63.9 Heparin Dosing Weight: 95 kg   Assessment:  39 YOF presented on 10/10/17 with worsening renal function and found to have a DVT.  Patient is s/p cannulation of femoral and tibial arteries and stenting today 11/07/17.  Pharmacy consulted to resume heparin 8 hour post sheath removal.  Sheath removed around 1800 and no bleeding/hematoma per RN.  Patient does have an old bruise on her stomach.   Goal of Therapy:  Heparin level 0.3-0.7 units/ml Monitor platelets by anticoagulation protocol: Yes    Plan:  At 0200 on 11/08/17, resume heparin gtt at 2250 units/hr, no bolus Check 8 hr heparin level   Kristen Berry D. Mina Marble, PharmD, BCPS Pager:  [phone] 11/07/2017, 6:43 PM

## 2017-11-08 ENCOUNTER — Encounter (HOSPITAL_COMMUNITY): Payer: Self-pay | Admitting: Vascular Surgery

## 2017-11-08 DIAGNOSIS — L8990 Pressure ulcer of unspecified site, unspecified stage: Secondary | ICD-10-CM

## 2017-11-08 LAB — RENAL FUNCTION PANEL
Albumin: 2.6 g/dL — ABNORMAL LOW (ref 3.5–5.0)
Anion gap: 12 (ref 5–15)
BUN: 7 mg/dL (ref 6–20)
CO2: 25 mmol/L (ref 22–32)
Calcium: 8 mg/dL — ABNORMAL LOW (ref 8.9–10.3)
Chloride: 99 mmol/L — ABNORMAL LOW (ref 101–111)
Creatinine, Ser: 1.75 mg/dL — ABNORMAL HIGH (ref 0.44–1.00)
GFR calc Af Amer: 35 mL/min — ABNORMAL LOW (ref >60)
GFR calc non Af Amer: 30 mL/min — ABNORMAL LOW (ref >60)
Glucose, Bld: 84 mg/dL (ref 65–99)
Phosphorus: 1.8 mg/dL — ABNORMAL LOW (ref 2.5–4.6)
Potassium: 3.8 mmol/L (ref 3.5–5.1)
Sodium: 136 mmol/L (ref 135–145)

## 2017-11-08 LAB — BASIC METABOLIC PANEL
Anion gap: 13 (ref 5–15)
BUN: 24 mg/dL — ABNORMAL HIGH (ref 6–20)
CO2: 24 mmol/L (ref 22–32)
Calcium: 8.3 mg/dL — ABNORMAL LOW (ref 8.9–10.3)
Chloride: 97 mmol/L — ABNORMAL LOW (ref 101–111)
Creatinine, Ser: 4.01 mg/dL — ABNORMAL HIGH (ref 0.44–1.00)
GFR calc Af Amer: 13 mL/min — ABNORMAL LOW (ref >60)
GFR calc non Af Amer: 11 mL/min — ABNORMAL LOW (ref >60)
Glucose, Bld: 98 mg/dL (ref 65–99)
Potassium: 3.4 mmol/L — ABNORMAL LOW (ref 3.5–5.1)
Sodium: 134 mmol/L — ABNORMAL LOW (ref 135–145)

## 2017-11-08 LAB — CBC
HCT: 25.4 % — ABNORMAL LOW (ref 36.0–46.0)
HCT: 27.1 % — ABNORMAL LOW (ref 36.0–46.0)
Hemoglobin: 7.4 g/dL — ABNORMAL LOW (ref 12.0–15.0)
Hemoglobin: 8 g/dL — ABNORMAL LOW (ref 12.0–15.0)
MCH: 27.7 pg (ref 26.0–34.0)
MCH: 27.8 pg (ref 26.0–34.0)
MCHC: 29.1 g/dL — ABNORMAL LOW (ref 30.0–36.0)
MCHC: 29.5 g/dL — ABNORMAL LOW (ref 30.0–36.0)
MCV: 95.1 fL (ref 78.0–100.0)
MCV: 94.1 fL (ref 78.0–100.0)
Platelets: 380 10*3/uL (ref 150–400)
Platelets: 396 10*3/uL (ref 150–400)
RBC: 2.67 MIL/uL — ABNORMAL LOW (ref 3.87–5.11)
RBC: 2.88 MIL/uL — ABNORMAL LOW (ref 3.87–5.11)
RDW: 21.2 % — ABNORMAL HIGH (ref 11.5–15.5)
RDW: 20.9 % — ABNORMAL HIGH (ref 11.5–15.5)
WBC: 7.2 10*3/uL (ref 4.0–10.5)
WBC: 7.1 10*3/uL (ref 4.0–10.5)

## 2017-11-08 LAB — HEPARIN LEVEL (UNFRACTIONATED)
Heparin Unfractionated: >2.2 IU/mL — ABNORMAL HIGH (ref 0.30–0.70)
Heparin Unfractionated: 0.62 IU/mL (ref 0.30–0.70)

## 2017-11-08 LAB — PREPARE RBC (CROSSMATCH)

## 2017-11-08 MED ORDER — HEPARIN SODIUM (PORCINE) 1000 UNIT/ML DIALYSIS
20.0000 [IU]/kg | INTRAMUSCULAR | Status: DC | PRN
  Filled 2017-11-08: qty 2

## 2017-11-08 MED ORDER — OXYCODONE HCL 5 MG PO TABS
ORAL_TABLET | ORAL | Status: AC
  Filled 2017-11-08: qty 1

## 2017-11-08 MED ORDER — ALTEPLASE 2 MG IJ SOLR
INTRAMUSCULAR | Status: AC
  Administered 2017-11-08: 2 mg
  Filled 2017-11-08: qty 4

## 2017-11-08 MED ORDER — SODIUM CHLORIDE 0.9 % IV SOLN
Freq: Once | INTRAVENOUS | Status: AC
  Administered 2017-11-08: 14:00:00 via INTRAVENOUS

## 2017-11-08 MED FILL — Heparin Sodium (Porcine) 2 Unit/ML in Sodium Chloride 0.9%: INTRAMUSCULAR | Qty: 1000 | Status: AC

## 2017-11-08 MED FILL — Lidocaine HCl Local Inj 1%: INTRAMUSCULAR | Qty: 20 | Status: AC

## 2017-11-08 NOTE — Progress Notes (Signed)
PT Cancellation Note  Patient Details Name: AISHAH TEFFETELLER MRN: [id number] DOB: 13-Oct-1955   Cancelled Treatment:    Reason Eval/Treat Not Completed: Patient at procedure or test/unavailable Pt off floor for HD. Will follow.   Marguarite Arbour A Anner Baity 11/08/2017, 9:40 AM Wray Kearns, PT, DPT [phone]

## 2017-11-08 NOTE — Progress Notes (Addendum)
[place] for Heparin Indication: DVT  Patient Measurements: Height: 5\' 8"  (172.7 cm) Weight: 214 lb 8.1 oz (97.3 kg) IBW/kg (Calculated) : 63.9 Heparin Dosing Weight: 95 kg   Assessment:  34 YOF presented on 10/10/17 with worsening renal function and found to have a DVT.  Patient is s/p cannulation of femoral and tibial arteries and stenting today 11/07/17.    Heparin resumed yesterday post procedure. Heparin level remains at goal this afternoon (previous level this am drawn during dialysis and was a lab error). No bleeding issues noted, hgb 8 and pt to receive 1 unit today.  Goal of Therapy:  Heparin level 0.3-0.7 units/ml Monitor platelets by anticoagulation protocol: Yes    Plan:  Continue heparin at 2250 units/hr Daily heparin level/CBC   Erin Hearing PharmD., BCPS Clinical Pharmacist 11/08/2017 3:00 PM

## 2017-11-08 NOTE — Procedures (Addendum)
I was present at this dialysis session. I have reviewed the session itself and made appropriate changes.   Filed Weights   11/05/17 2137 11/06/17 0111 11/08/17 0403  Weight: 95 kg (209 lb 7 oz) 95 kg (209 lb 7 oz) 102.2 kg (225 lb 5 oz)    Recent Labs  Lab 11/05/17 0758 11/08/17 0406  NA 132* 134*  K 4.1 3.4*  CL 95* 97*  CO2 22 24  GLUCOSE 152* 98  BUN 31* 24*  CREATININE 4.44* 4.01*  CALCIUM 7.9* 8.3*  PHOS 5.4*  --     Recent Labs  Lab 11/06/17 0447 11/07/17 0337 11/08/17 0406  WBC 9.0 7.5 7.2  HGB 7.2* 7.3* 7.4*  HCT 24.8* 24.8* 25.4*  MCV 96.1 95.0 95.1  PLT 441* 399 380    Scheduled Meds: . amLODipine  5 mg Oral Daily  . calcium acetate  667 mg Oral TID WC  . carvedilol  25 mg Oral BID WC  . clopidogrel  75 mg Oral Q breakfast  . darbepoetin (ARANESP) injection - DIALYSIS  100 mcg Intravenous Q Wed-HD  . gabapentin  100 mg Oral Daily  . hydrALAZINE  50 mg Oral Q8H  . isosorbide mononitrate  120 mg Oral Daily  . multivitamin  1 tablet Oral QHS  . pantoprazole  40 mg Oral Daily  . polyethylene glycol  17 g Oral Daily  . [START ON 11/12/2017] pravastatin  40 mg Oral q1800  . sodium chloride flush  3 mL Intravenous Q12H  . sodium chloride flush  3 mL Intravenous Q12H   Continuous Infusions: . sodium chloride    . sodium chloride    . cefTRIAXone (ROCEPHIN)  IV    . DAPTOmycin (CUBICIN)  IV Stopped (11/06/17 2137)  . heparin 2,250 Units/hr (11/08/17 0152)   PRN Meds:.sodium chloride, sodium chloride, acetaminophen, camphor-menthol **AND** hydrOXYzine, docusate sodium, feeding supplement (NEPRO CARB STEADY), hydrALAZINE, ipratropium-albuterol, labetalol, nitroGLYCERIN, ondansetron (ZOFRAN) IV, oxyCODONE, sodium chloride flush, sodium chloride flush, sodium chloride flush, sorbitol, zolpidem    Assessment/Plan:  1. ESRD- progressive CKD complicated by cardiorenal syndrome.  Has been responding well to IHD and is due for another session tomorrow.  Has TTS  schedule first shift at [facility] once stable for discharge. 2. Acute on chronic CHF- improved with HD and UF.   3. CAD- diffuse disease with no PCI targets 4. Anemia of ESRD- cont with ESA and follow 5. CKD-MBD- stable 6. PVD- left second toe gangrene/cellulitis s/p intervention on left SFA by Dr. Donzetta Matters 11/07/17 but will likely require amputation.. 7. HTN- stable 8. Vascular access- will need conversion to tunneled HD catheter and placement of new dialysis access, hopefully after revascularization of left leg.   Donetta Potts,  MD 11/08/2017, 8:28 AM

## 2017-11-08 NOTE — Progress Notes (Signed)
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 Edrick Oh, Nephrology) discharged from Hospitalist service at [place] on 10/04/17 after 5-day hospital stay for acute osteomyelitis of second left toe with cellulitis and discharged on IV ceftriaxone and daptomycin via PICC line, follow-up creatinine 4.7, associated weakness dyspnea decreased appetite and urine output. Transferred to [facility] due to worsening renal failure, temporary dialysis catheter was placed on 10/19/2016, the patient started dialysis on 10/20/2016, orthopedic and vascular surgery was consulted.  Vascular surgery plans retrograde angiogram and stenting of left SFA on 2/11 and other procedures including toe amputation, left arm AV access and tunneled catheter later.  Assessment & Plan:   #Acute kidney injury on chronic kidney disease stage IV now progressed to ESRD: - Fluid overload initially did not improve with diuretics and started on hemodialysis.  - Still with fluid overload but steadily improving with dialysis and attempts to lower dry weight. - Awaiting conversion to tunneled HD catheter as well as placement of permanent HD/left arm access by vascular surgery  On thursday  - For outpatient HD, patient is accepted at [facility] at 06:35am - Nephrology following for dialysis needs.  #Acute systolic congestive heart failure/cardiomyopathy:  - EF of 35-40%.   - s/p cath with multi-vessel disease with no target for PCI and no indication for CABG. No chest pain today.  - on   coreg, statin, hydralazine, imdur - Ultrafiltration during dialysis.  - cardiology consult appreciated.  - Statin after completion of daptomycin. -  Improved  and stable.  #Anemia of chronic kidney disease:  - Continue Aranesp and IV iron during the dialysis.  Monitor CBC.  Hemoglobin in the low 7 range for the last 3 days and ordered  blood transfusion pre op , will give one unit today with HD to optimize prior to surgery.  Aranesp dose increased.  Follow CBC in a.m    .  #Chest pain/shortness of breath  - likely due to fluid overload and pulmonary edema: No further chest pain reported.  #Left second toe osteomyelitis/peripheral vascular disease:  -s/p attempted left lower extremity revascularization that was unsuccessful and she has a moderate length segment SFA occlusion on the left and a diminutive proximal SFA on 11/03/2017.   -Remains on IV ceftriaxone and daptomycin. - per Vascular surgery s/p  retrograde angiogram and stenting of left SFA  2/11  ,on plavix , asa dc'd by Dr Donzetta Matters. Subsequent toe amputation on thursday.  #Right lower extremity DVT:  - On IV heparin.  Needs oral long-term anticoagulation/oral after the procedure done.  #Essential hypertension:  Continue current medication.  Dialysis per nephrology.  Controlled.  #Physical deconditioning: PT OT evaluation >recommend home health PT and OT at discharge..  DVT prophylaxis: heparin Code Status:full code Family Communication: None at bedside today. Disposition Plan:  Plan for tdc, left arm avf vs graft and left 2nd toe amputation on Thursday.      Consultants:   Cardiology  Vascular surgery  Orthopedics  Nephrology  Procedures: HD Rest as above.  Antimicrobials: Rocephin and daptomycin  Subjective: Seen in HD .   Denied complaints.  No pain reported.  Denied dyspnea or chest pain.  Objective: Vitals:   11/08/17 0828 11/08/17 0845 11/08/17  0900 11/08/17 0930  BP: (!) 157/68 (!) 152/71 (!) 148/69 (!) 145/68  Pulse: 62 66 61 62  Resp: 11 10    Temp: (!) 97.5 F (36.4 C)     TempSrc: Oral     SpO2: 99%  99%   Weight: 99.1 kg (218 lb 7.6 oz)     Height:         Intake/Output Summary (Last 24 hours) at 11/08/2017 1057 Last data filed at 11/07/2017 2246 Gross per 24 hour  Intake 209 ml  Output -  Net 209 ml   Filed Weights   11/06/17 0111 11/08/17 0403 11/08/17 0828  Weight: 95 kg (209 lb 7 oz) 102.2 kg (225 lb 5 oz) 99.1 kg (218 lb 7.6 oz)    Examination:  General exam: Middle-aged female, moderately built and nourished lying comfortably propped up in bed.  Respiratory system: Clear to auscultation.  No increased work of breathing.  Stable without change. Cardiovascular system: Regular rate rhythm S1-S2 normal.  No pedal edema.  No JVD or murmurs.  Telemetry personally reviewed: Sinus rhythm with BBB morphology. Gastrointestinal system: Abdomen soft, nontender.  Bowel sounds positive.  Stable without change. Central nervous system: Alert and oriented. No focal neurological deficits.  Stable without change. Extremities: Symmetric 5 x 5 power.  Left foot dressing clean and dry. Psychiatry: Judgement and insight appear normal. Mood & affect appropriate.     Data Reviewed: I have personally reviewed following labs and imaging studies  CBC: Recent Labs  Lab 11/04/17 0415 11/05/17 0419 11/06/17 0447 11/07/17 0337 11/08/17 0406  WBC 9.2 12.0* 9.0 7.5 7.2  HGB 7.5* 7.2* 7.2* 7.3* 7.4*  HCT 26.1* 24.9* 24.8* 24.8* 25.4*  MCV 95.3 94.7 96.1 95.0 95.1  PLT 408* 466* 441* 399 448   Basic Metabolic Panel: Recent Labs  Lab 11/02/17 0412 11/05/17 0758 11/08/17 0406  NA 135 132* 134*  K 4.0 4.1 3.4*  CL 97* 95* 97*  CO2 23 22 24   GLUCOSE 116* 152* 98  BUN 16 31* 24*  CREATININE 2.29* 4.44* 4.01*  CALCIUM 8.1* 7.9* 8.3*  PHOS  --  5.4*  --    GFR: Estimated Creatinine Clearance: 17.9 mL/min (A) (by C-G formula based on SCr of 4.01 mg/dL (H)). Liver Function Tests: Recent Labs  Lab 11/05/17 0758  ALBUMIN 2.5*   Coagulation Profile: No results for input(s): INR, PROTIME in the last 168 hours. Cardiac Enzymes: Recent Labs   Lab 11/03/17 0345  CKTOTAL 52   CBG: Recent Labs  Lab 11/01/17 1302 11/03/17 1555 11/03/17 1936 11/03/17 2127 11/04/17 0629  GLUCAP 121* 95 119* 133* 130*     Recent Results (from the past 240 hour(s))  Surgical PCR screen     Status: None   Collection Time: 11/01/17 12:38 AM  Result Value Ref Range Status   MRSA, PCR NEGATIVE NEGATIVE Final   Staphylococcus aureus NEGATIVE NEGATIVE Final    Comment: (NOTE) The Xpert SA Assay (FDA approved for NASAL specimens in patients 81 years of age and older), is one component of a comprehensive surveillance program. It is not intended to diagnose infection nor to guide or monitor treatment. Performed at [facility], [place] [address] [place], [place] [address]          Radiology Studies:   Scheduled Meds: . amLODipine  5 mg Oral Daily  . calcium acetate  667 mg Oral TID WC  . carvedilol  25 mg Oral BID WC  . clopidogrel  75 mg Oral Q breakfast  . darbepoetin (ARANESP) injection - DIALYSIS  100 mcg Intravenous Q Wed-HD  . gabapentin  100 mg Oral Daily  . hydrALAZINE  50 mg Oral Q8H  . isosorbide mononitrate  120 mg Oral Daily  . multivitamin  1 tablet Oral QHS  . pantoprazole  40 mg Oral Daily  . polyethylene glycol  17 g Oral Daily  . [START ON 11/12/2017] pravastatin  40 mg Oral q1800  . sodium chloride flush  3 mL Intravenous Q12H  . sodium chloride flush  3 mL Intravenous Q12H   Continuous Infusions: . sodium chloride    . sodium chloride    . cefTRIAXone (ROCEPHIN)  IV    . DAPTOmycin (CUBICIN)  IV Stopped (11/06/17 2137)  . heparin 2,250 Units/hr (11/08/17 0152)     LOS: 29 days    Reyne Dumas, MD,  [facility] Pager [phone]  If 7PM-7AM, please contact night-coverage [email or web address] Password [facility] 11/08/2017, 10:57 AM

## 2017-11-08 NOTE — Progress Notes (Signed)
Report given to Dialysis RN.

## 2017-11-08 NOTE — Progress Notes (Signed)
  Progress Note    11/08/2017 9:59 AM 1 Day Post-Op  Subjective:  No current complaints  Vitals:   11/08/17 0900 11/08/17 0930  BP: (!) 148/69 (!) 145/68  Pulse: 61 62  Resp:    Temp:    SpO2: 99%     Physical Exam: aaox3 On hd via catheter Right groin is soft Palpable left AT Dressing on left foot cdi  CBC    Component Value Date/Time   WBC 7.2 11/08/2017 0406   RBC 2.67 (L) 11/08/2017 0406   HGB 7.4 (L) 11/08/2017 0406   HGB 14.3 12/14/2016 1139   HCT 25.4 (L) 11/08/2017 0406   HCT 44.2 12/14/2016 1139   PLT 380 11/08/2017 0406   PLT 429 (H) 12/14/2016 1139   MCV 95.1 11/08/2017 0406   MCV 88.5 12/14/2016 1139   MCH 27.7 11/08/2017 0406   MCHC 29.1 (L) 11/08/2017 0406   RDW 21.2 (H) 11/08/2017 0406   RDW 17.6 (H) 12/14/2016 1139   LYMPHSABS 0.8 10/26/2017 1200   LYMPHSABS 1.9 12/14/2016 1139   MONOABS 1.9 (H) 10/26/2017 1200   MONOABS 0.7 12/14/2016 1139   EOSABS 0.3 10/26/2017 1200   EOSABS 0.0 10/17/2017 1430   BASOSABS 0.1 10/26/2017 1200   BASOSABS 0.1 12/14/2016 1139    BMET    Component Value Date/Time   NA 134 (L) 11/08/2017 0406   NA 137 12/14/2016 1139   K 3.4 (L) 11/08/2017 0406   K 4.2 12/14/2016 1139   CL 97 (L) 11/08/2017 0406   CO2 24 11/08/2017 0406   CO2 20 (L) 12/14/2016 1139   GLUCOSE 98 11/08/2017 0406   GLUCOSE 115 12/14/2016 1139   BUN 24 (H) 11/08/2017 0406   BUN 32.8 (H) 12/14/2016 1139   CREATININE 4.01 (H) 11/08/2017 0406   CREATININE 2.1 (H) 12/14/2016 1139   CALCIUM 8.3 (L) 11/08/2017 0406   CALCIUM 9.5 12/14/2016 1139   GFRNONAA 11 (L) 11/08/2017 0406   GFRAA 13 (L) 11/08/2017 0406    INR    Component Value Date/Time   INR 1.48 11/01/2017 0451     Intake/Output Summary (Last 24 hours) at 11/08/2017 0959 Last data filed at 11/07/2017 2246 Gross per 24 hour  Intake 209 ml  Output -  Net 209 ml     Assessment:  62 y.o. female is s/p left SFA revascularization  Plan: plavix discontinue  aspirin Heparin for dvt-will need po AC following all surgeries Plan for tdc, left arm avf vs graft and left 2nd toe amputation on Thursday.   Kristen Berry C. Donzetta Matters, MD [facility] [place] Office: [phone] Pager: [phone]  11/08/2017 9:59 AM

## 2017-11-09 LAB — CBC
HCT: 28 % — ABNORMAL LOW (ref 36.0–46.0)
Hemoglobin: 8.3 g/dL — ABNORMAL LOW (ref 12.0–15.0)
MCH: 27.7 pg (ref 26.0–34.0)
MCHC: 29.6 g/dL — ABNORMAL LOW (ref 30.0–36.0)
MCV: 93.3 fL (ref 78.0–100.0)
Platelets: 371 10*3/uL (ref 150–400)
RBC: 3 MIL/uL — ABNORMAL LOW (ref 3.87–5.11)
RDW: 21.6 % — ABNORMAL HIGH (ref 11.5–15.5)
WBC: 8.5 10*3/uL (ref 4.0–10.5)

## 2017-11-09 LAB — HEPARIN LEVEL (UNFRACTIONATED): Heparin Unfractionated: 0.5 IU/mL (ref 0.30–0.70)

## 2017-11-09 MED ORDER — HYDRALAZINE HCL 20 MG/ML IJ SOLN: 10.0000 mg | Freq: Four times a day (QID) | INTRAMUSCULAR | Status: DC | PRN

## 2017-11-09 MED ORDER — CEFAZOLIN SODIUM-DEXTROSE 2-4 GM/100ML-% IV SOLN
2.0000 g | INTRAVENOUS | Status: AC
  Administered 2017-11-10: 2 g via INTRAVENOUS
  Filled 2017-11-09: qty 100

## 2017-11-09 NOTE — Progress Notes (Signed)
[name] for Heparin Indication: DVT  Patient Measurements: Height: 5\' 8"  (172.7 cm) Weight: 217 lb 4.8 oz (98.6 kg) IBW/kg (Calculated) : 63.9 Heparin Dosing Weight: 95 kg   Assessment:  51 YOF presented on 10/10/17 with worsening renal function and found to have a DVT. Patient is s/p cannulation of femoral and tibial arteries and stenting 11/07/17. Plan is for [facility], left arm AV fistula versus graft, and L 2nd toe amputation 2/14.  Heparin resumed post-procedure. Heparin level remains at [facility]. No bleeding issues noted, hgb stable 8.3 - s/p 1 unit PRBC on 2/12, plt wnl. No bleed documented.  Goal of Therapy:  Heparin level 0.3-0.7 units/ml Monitor platelets by anticoagulation protocol: Yes    Plan:  Continue heparin at 2250 units/hr Daily heparin level/CBC Monitor for s/sx bleeding  [name], PharmD, BCPS Clinical Pharmacist Clinical phone for 11/09/2017 until 3:30pm: [phone] If after 3:30pm, please call main pharmacy at: [phone] 11/09/2017 10:10 AM

## 2017-11-09 NOTE — Progress Notes (Signed)
Physical Therapy Treatment Patient Details Name: Kristen Berry MRN: [id number] DOB: January 16, 1956 Today's Date: 11/09/2017    History of Present Illness Patient is a 62 year old female who presented to the hospital with shortness of breath. She was found to have an acute kidney injury. PMH: PVD, HTN, CHF CKD, OA     PT Comments    Pt performed gait training with emphasis on safety with use of device.  Pt SPO2 on 3L 98%.  Pt with planned surgery tommorow to amputate her L toe and LUE fistula.  Pt will require reassessment post surgery particularly if weight bearing restrictions are present.  Plan to return home with HHPT remains appropriate at this time.    Follow Up Recommendations  Home health PT     Equipment Recommendations  Rolling walker with 5" wheels    Recommendations for Other Services       Precautions / Restrictions Precautions Precautions: Fall Restrictions Weight Bearing Restrictions: No    Mobility  Bed Mobility   Bed Mobility: Supine to Sit     Supine to sit: Modified independent (Device/Increase time)     General bed mobility comments: Increased time with + use of rail to achieve sitting edge of bed.    Transfers Overall transfer level: Needs assistance Equipment used: Rolling walker (2 wheeled) Transfers: Sit to/from Stand Sit to Stand: Min guard         General transfer comment: Cues for hand placement to and from seated surface for safety, patient slow and guarded to achieve standing.  Increased time noted.    Ambulation/Gait Ambulation/Gait assistance: Min guard Ambulation Distance (Feet): 80 Feet Assistive device: Rolling walker (2 wheeled) Gait Pattern/deviations: Step-through pattern;Decreased stride length;Trunk flexed Gait velocity: Decreased Gait velocity interpretation: Below normal speed for age/gender General Gait Details: cues for getting closer to the RW and postural checks, cues to maintain position in RW during turns.      Stairs            Wheelchair Mobility    Modified Rankin (Stroke Patients Only)       Balance Overall balance assessment: Needs assistance   Sitting balance-Leahy Scale: Good       Standing balance-Leahy Scale: Poor Standing balance comment: heavily reliant on UE support                            Cognition Arousal/Alertness: Awake/alert Behavior During Therapy: WFL for tasks assessed/performed Overall Cognitive Status: Within Functional Limits for tasks assessed                                        Exercises      General Comments        Pertinent Vitals/Pain Pain Assessment: Faces Faces Pain Scale: Hurts a little bit Pain Location: general discomfort, L LE  Pain Descriptors / Indicators: Discomfort Pain Intervention(s): Monitored during session;Repositioned    Home Living                      Prior Function            PT Goals (current goals can now be found in the care plan section) Acute Rehab PT Goals Patient Stated Goal: Feel stronger  Potential to Achieve Goals: Good Progress towards PT goals: Progressing toward goals    Frequency  PT Plan Current plan remains appropriate    Co-evaluation              AM-PAC PT "6 Clicks" Daily Activity  Outcome Measure  Difficulty turning over in bed (including adjusting bedclothes, sheets and blankets)?: None Difficulty moving from lying on back to sitting on the side of the bed? : None Difficulty sitting down on and standing up from a chair with arms (e.g., wheelchair, bedside commode, etc,.)?: A Little Help needed moving to and from a bed to chair (including a wheelchair)?: A Little Help needed walking in hospital room?: A Little Help needed climbing 3-5 steps with a railing? : A Little 6 Click Score: 20    End of Session Equipment Utilized During Treatment: Oxygen;Gait belt Activity Tolerance: Patient limited by fatigue Patient  left: with call bell/phone within reach;in chair;with chair alarm set;with family/visitor present Nurse Communication: Mobility status PT Visit Diagnosis: Difficulty in walking, not elsewhere classified (R26.2);Other abnormalities of gait and mobility (R26.89)     Time: 1132-1150 PT Time Calculation (min) (ACUTE ONLY): 18 min  Charges:  $Gait Training: 8-22 mins                    G Codes:       [name], PTA pager [phone]    [name] 11/09/2017, 1:21 PM

## 2017-11-09 NOTE — Progress Notes (Addendum)
  Progress Note    11/09/2017 7:32 AM 2 Days Post-Op  Subjective:  Patient denies rest pain L foot; some soreness to tibial cath site   Vitals:   11/08/17 1944 11/09/17 0429  BP: (!) 160/62 (!) 138/54  Pulse: (!) 106 75  Resp: 16 (!) 23  Temp: 98.2 F (36.8 C) 97.7 F (36.5 C)  SpO2: 94% 99%   Physical Exam: Lungs:  Non labored on room air Extremities:  Palpable L AT; dressing left in place L 2nd toe; no palpable hematoma L tibial cath site Abdomen:  Soft Neurologic: A&O  CBC    Component Value Date/Time   WBC 8.5 11/09/2017 0507   RBC 3.00 (L) 11/09/2017 0507   HGB 8.3 (L) 11/09/2017 0507   HGB 14.3 12/14/2016 1139   HCT 28.0 (L) 11/09/2017 0507   HCT 44.2 12/14/2016 1139   PLT 371 11/09/2017 0507   PLT 429 (H) 12/14/2016 1139   MCV 93.3 11/09/2017 0507   MCV 88.5 12/14/2016 1139   MCH 27.7 11/09/2017 0507   MCHC 29.6 (L) 11/09/2017 0507   RDW 21.6 (H) 11/09/2017 0507   RDW 17.6 (H) 12/14/2016 1139   LYMPHSABS 0.8 10/26/2017 1200   LYMPHSABS 1.9 12/14/2016 1139   MONOABS 1.9 (H) 10/26/2017 1200   MONOABS 0.7 12/14/2016 1139   EOSABS 0.3 10/26/2017 1200   EOSABS 0.0 10/17/2017 1430   BASOSABS 0.1 10/26/2017 1200   BASOSABS 0.1 12/14/2016 1139    BMET    Component Value Date/Time   NA 136 11/08/2017 1344   NA 137 12/14/2016 1139   K 3.8 11/08/2017 1344   K 4.2 12/14/2016 1139   CL 99 (L) 11/08/2017 1344   CO2 25 11/08/2017 1344   CO2 20 (L) 12/14/2016 1139   GLUCOSE 84 11/08/2017 1344   GLUCOSE 115 12/14/2016 1139   BUN 7 11/08/2017 1344   BUN 32.8 (H) 12/14/2016 1139   CREATININE 1.75 (H) 11/08/2017 1344   CREATININE 2.1 (H) 12/14/2016 1139   CALCIUM 8.0 (L) 11/08/2017 1344   CALCIUM 9.5 12/14/2016 1139   GFRNONAA 30 (L) 11/08/2017 1344   GFRAA 35 (L) 11/08/2017 1344    INR    Component Value Date/Time   INR 1.48 11/01/2017 0451     Intake/Output Summary (Last 24 hours) at 11/09/2017 0732 Last data filed at 11/09/2017 0263 Gross per 24  hour  Intake 1600.5 ml  Output 1652 ml  Net -51.5 ml     Assessment/Plan:  62 y.o. female is s/p L SFA stent 2 Days Post-Op   Palpable L AT s/p L SFA stent Continue plavix Plan is for [facility], left arm AV fistula versus graft, and L 2nd toe amp tomorrow NPO midnight tonight IV heparin stop on call to OR tomorrow  Dagoberto Ligas, PA-C [facility] [phone] 11/09/2017 7:32 AM   I have independently interviewed and examined patient and agree with PA assessment and plan above. NPO past midnight for procedures tomorrow morning.  Shelley Pooley C. Donzetta Matters, MD [facility] [place] Office: [phone] Pager: [phone]

## 2017-11-09 NOTE — Progress Notes (Signed)
[facility] Progress Note   Subjective: Very pleasant, currently walking with PT. Slightly winded but no C/O SOB. Going for placement of TDC, permanent AV access and toe amputation tomorrow per Dr. Donzetta Matters.   Objective Vitals:   11/08/17 1630 11/08/17 1944 11/09/17 0429 11/09/17 0752  BP: (!) 163/67 (!) 160/62 (!) 138/54 (!) 136/95  Pulse:  (!) 106 75   Resp:  16 (!) 23 (!) 27  Temp: 98.2 F (36.8 C) 98.2 F (36.8 C) 97.7 F (36.5 C) 98.3 F (36.8 C)  TempSrc: Oral Oral Oral Oral  SpO2:  94% 99% 95%  Weight:   98.6 kg (217 lb 4.8 oz)   Height:       Physical Exam General: Pleasant elderly female in NAD Heart: S1,S2, no M/R/G Lungs: CTAB A/P Abdomen: Active BS  Extremities: No LE Edema.  Dialysis Access: RIJ Temp cath drsg intact  Additional Objective Labs: Basic Metabolic Panel: Recent Labs  Lab 11/05/17 0758 11/08/17 0406 11/08/17 1344  NA 132* 134* 136  K 4.1 3.4* 3.8  CL 95* 97* 99*  CO2 22 24 25   GLUCOSE 152* 98 84  BUN 31* 24* 7  CREATININE 4.44* 4.01* 1.75*  CALCIUM 7.9* 8.3* 8.0*  PHOS 5.4*  --  1.8*   Liver Function Tests: Recent Labs  Lab 11/05/17 0758 11/08/17 1344  ALBUMIN 2.5* 2.6*   No results for input(s): LIPASE, AMYLASE in the last 168 hours. CBC: Recent Labs  Lab 11/06/17 0447 11/07/17 0337 11/08/17 0406 11/08/17 1344 11/09/17 0507  WBC 9.0 7.5 7.2 7.1 8.5  HGB 7.2* 7.3* 7.4* 8.0* 8.3*  HCT 24.8* 24.8* 25.4* 27.1* 28.0*  MCV 96.1 95.0 95.1 94.1 93.3  PLT 441* 399 380 396 371   Blood Culture    Component Value Date/Time   SDES BLOOD LEFT HAND 10/19/2017 1500   SPECREQUEST  10/19/2017 1500    BOTTLES DRAWN AEROBIC AND ANAEROBIC Blood Culture adequate volume   CULT NO GROWTH 5 DAYS 10/19/2017 1500   REPTSTATUS 10/24/2017 FINAL 10/19/2017 1500    Cardiac Enzymes: Recent Labs  Lab 11/03/17 0345  CKTOTAL 52   CBG: Recent Labs  Lab 11/03/17 1555 11/03/17 1936 11/03/17 2127 11/04/17 0629  GLUCAP 95 119*  133* 130*   Iron Studies: No results for input(s): IRON, TIBC, TRANSFERRIN, FERRITIN in the last 72 hours. @lablastinr3 @ Studies/Results: No results found. Medications: . sodium chloride    . sodium chloride 250 mL (11/09/17 0510)  . [START ON 11/10/2017]  ceFAZolin (ANCEF) IV    . cefTRIAXone (ROCEPHIN)  IV Stopped (11/08/17 1842)  . DAPTOmycin (CUBICIN)  IV Stopped (11/08/17 2203)  . heparin 2,250 Units/hr (11/08/17 2306)   . amLODipine  5 mg Oral Daily  . calcium acetate  667 mg Oral TID WC  . carvedilol  25 mg Oral BID WC  . clopidogrel  75 mg Oral Q breakfast  . darbepoetin (ARANESP) injection - DIALYSIS  100 mcg Intravenous Q Wed-HD  . gabapentin  100 mg Oral Daily  . hydrALAZINE  50 mg Oral Q8H  . isosorbide mononitrate  120 mg Oral Daily  . multivitamin  1 tablet Oral QHS  . pantoprazole  40 mg Oral Daily  . polyethylene glycol  17 g Oral Daily  . [START ON 11/12/2017] pravastatin  40 mg Oral q1800  . sodium chloride flush  3 mL Intravenous Q12H  . sodium chloride flush  3 mL Intravenous Q12H   HD Orders: New ESRD   Assessment/Plan:  1.  ESRD- progressive CKD complicated by cardiorenal syndrome.  Has been responding well to IHD and is due for another session tomorrow.  Has TTS schedule first shift at [facility] once stable for discharge. 2. Acute on chronic CHF- improved with HD and UF.   3. CAD- diffuse disease with no PCI targets 4. 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 Repeat PTH.  6. PVD- left second toe osteomyelitis/PVD. Will have 2nd toe amp tomorrow per Dr. Donzetta Matters. On Dapto and ceftriaxone.  7. HTN/volume- stable. Nadir wt 95 kg. Wt today 98.6 kg Attempt UFG 2-2.5 liters tomorrow.  8.   Vascular access- Going for placement of TDC and AVF vs AVG placement per Dr. Donzetta Matters       tomorrow. HD post surgery.   Laylynn Campanella H. Myka Hitz NP-C 11/09/2017, 11:37 AM  [employer] [phone]

## 2017-11-09 NOTE — Progress Notes (Signed)
Occupational Therapy Treatment Patient Details Name: Kristen Berry MRN: [id number] DOB: [birth year] Today's Date: 11/09/2017    History of present illness Patient is a 62 year old female who presented to the hospital with shortness of breath. She was found to have an acute kidney injury. PMH: PVD, HTN, CHF CKD, OA    OT comments  Pt making progress with functional goals. OT will continue to follow acutley  Follow Up Recommendations  Home health OT;Supervision/Assistance - 24 hour    Equipment Recommendations  None recommended by OT    Recommendations for Other Services      Precautions / Restrictions Precautions Precautions: Fall Restrictions Weight Bearing Restrictions: No       Mobility Bed Mobility   Bed Mobility: Supine to Sit     Supine to sit: Modified independent (Device/Increase time)     General bed mobility comments: pt up in recliner upon arrival  Transfers Overall transfer level: Needs assistance Equipment used: Rolling walker (2 wheeled) Transfers: Sit to/from Stand Sit to Stand: Min guard         General transfer comment: Cues for hand placement to and from seated surface for safety, patient slow and guarded to achieve standing.  Increased time noted.      Balance Overall balance assessment: Needs assistance Sitting-balance support: Feet supported;No upper extremity supported Sitting balance-Leahy Scale: Good     Standing balance support: Bilateral upper extremity supported;During functional activity;No upper extremity supported Standing balance-Leahy Scale: Poor Standing balance comment: heavily reliant on UE support                           ADL either performed or assessed with clinical judgement   ADL Overall ADL's : Needs assistance/impaired     Grooming: Wash/dry hands;Wash/dry face;Standing;Min guard           Upper Body Dressing : Sitting;Modified independent       Toilet Transfer: Min  guard;RW;Ambulation;Regular Toilet;Grab bars(3 in 1 over toilet)   Toileting- Clothing Manipulation and Hygiene: Sit to/from stand;Supervision/safety       Functional mobility during ADLs: Min guard;Rolling walker General ADL Comments: educated pt on energy conservation with handout provided     Vision Baseline Vision/History: Wears glasses Wears Glasses: At all times Patient Visual Report: No change from baseline     Perception     Praxis      Cognition Arousal/Alertness: Awake/alert Behavior During Therapy: WFL for tasks assessed/performed Overall Cognitive Status: Within Functional Limits for tasks assessed                                          Exercises     Shoulder Instructions       General Comments      Pertinent Vitals/ Pain       Pain Assessment: 0-10 Pain Score: 0-No pain Faces Pain Scale: Hurts a little bit Pain Location: general discomfort, L LE  Pain Descriptors / Indicators: Discomfort Pain Intervention(s): Monitored during session  Home Living                                          Prior Functioning/Environment              Frequency  Min 2X/week  Progress Toward Goals  OT Goals(current goals can now be found in the care plan section)  Progress towards OT goals: Progressing toward goals  Acute Rehab OT Goals Patient Stated Goal: Feel stronger   Plan Discharge plan remains appropriate    Co-evaluation                 AM-PAC PT "6 Clicks" Daily Activity     Outcome Measure   Help from another person eating meals?: None Help from another person taking care of personal grooming?: A Little Help from another person toileting, which includes using toliet, bedpan, or urinal?: A Little Help from another person bathing (including washing, rinsing, drying)?: A Little Help from another person to put on and taking off regular upper body clothing?: None Help from another person to put  on and taking off regular lower body clothing?: A Little 6 Click Score: 20    End of Session Equipment Utilized During Treatment: Oxygen;Gait belt;Rolling walker  OT Visit Diagnosis: Other abnormalities of gait and mobility (R26.89)   Activity Tolerance Patient tolerated treatment well   Patient Left in chair;with call bell/phone within reach   Nurse Communication      Functional Assessment Tool Used: AM-PAC 6 Clicks Daily Activity   Time: 0865-7846 OT Time Calculation (min): 27 min  Charges: OT G-codes **NOT FOR INPATIENT CLASS** Functional Assessment Tool Used: AM-PAC 6 Clicks Daily Activity OT General Charges $OT Visit: 1 Visit OT Treatments $Self Care/Home Management : 8-22 mins $Therapeutic Activity: 8-22 mins     Britt Bottom 11/09/2017, 2:19 PM

## 2017-11-09 NOTE — Progress Notes (Signed)
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 Edrick Oh, Nephrology) discharged from Hospitalist service at [facility] on 10/04/17 after 5-day hospital stay for acute osteomyelitis of second left toe with cellulitis and discharged on IV ceftriaxone and daptomycin via PICC line, follow-up creatinine 4.7, associated weakness dyspnea decreased appetite and urine output. Transferred to [facility] due to worsening renal failure, temporary dialysis catheter was placed on 10/19/2016, the patient started dialysis on 10/20/2016, orthopedic and vascular surgery was consulted.  Vascular surgery plans retrograde angiogram and stenting of left SFA on 2/11 and other procedures including toe amputation, left arm AV access and tunneled catheter   Assessment & Plan:   #Acute kidney injury on chronic kidney disease stage IV now progressed to ESRD: - Fluid overload initially did not improve with diuretics and started on hemodialysis.  -  fluid overload   steadily improving with dialysis and attempts to lower dry weight. - Awaiting conversion to tunneled HD catheter as well as placement of permanent HD/left arm access by vascular surgery  On thursday  - For outpatient HD, patient is accepted at [facility] at 06:35am - Nephrology following for dialysis needs. Next hemodialysis tomorrow after surgery  #Acute systolic congestive heart failure/cardiomyopathy:  - EF of 35-40%.   - s/p cath with multi-vessel disease with no target for PCI and no indication for CABG. No chest pain today.  - on   coreg, statin, hydralazine, imdur - Ultrafiltration during dialysis.  - cardiology consult appreciated.  - Statin after completion of  daptomycin. -  Improved and stable.  #Anemia of chronic kidney disease:  - Continue Aranesp and IV iron during the dialysis.  Monitor CBC.  Hemoglobin in the low 7 range for the last 3 days , status post transfusion on 2/12 , hemoglobin now 8.3 , repeat CBC tomorrow, transfuse if needed during HD .  Aranesp dose increased.  Follow CBC in a.m    .  #Chest pain/shortness of breath  - likely due to fluid overload and pulmonary edema: No further chest pain reported.  #Left second toe osteomyelitis/peripheral vascular disease:  -s/p attempted left lower extremity revascularization that was unsuccessful and she has a moderate length segment SFA occlusion on the left and a diminutive proximal SFA on 11/03/2017.   -Remains on IV ceftriaxone and daptomycin. - per Vascular surgery s/p  retrograde angiogram and stenting of left SFA  2/11  ,on plavix , asa dc'd by Dr Donzetta Matters. Subsequent toe amputation on thursday.  #Right lower extremity DVT:  - On IV heparin.  Will probably start patient on Coumadin for this post surgery  #Essential hypertension:  Continue current medication.  Dialysis per nephrology.  Controlled.  #Physical deconditioning: PT OT evaluation >recommend home health PT and OT at discharge..  DVT prophylaxis: heparin Code Status:full code Family Communication: None at bedside today. Disposition Plan:  Plan for tdc, left arm avf vs graft and left 2nd toe amputation on Thursday.      Consultants:   Cardiology  Vascular surgery  Orthopedics  Nephrology  Procedures: HD Rest as above.  Antimicrobials: Rocephin and daptomycin  Subjective:  Resting comfortably, no complaints,npo after midnight  Objective: Vitals:   11/08/17 1630 11/08/17 1944 11/09/17 0429 11/09/17 6761  BP: (!) 163/67 (!) 160/62 (!) 138/54 (!) 136/95  Pulse:  (!) 106 75   Resp:  16 (!) 23 (!) 27  Temp: 98.2 F (36.8 C) 98.2 F (36.8 C) 97.7 F (36.5 C) 98.3 F (36.8 C)  TempSrc: Oral Oral Oral Oral   SpO2:  94% 99% 95%  Weight:   98.6 kg (217 lb 4.8 oz)   Height:        Intake/Output Summary (Last 24 hours) at 11/09/2017 1222 Last data filed at 11/09/2017 3614 Gross per 24 hour  Intake 1600.5 ml  Output 1652 ml  Net -51.5 ml   Filed Weights   11/08/17 0828 11/08/17 1223 11/09/17 0429  Weight: 99.1 kg (218 lb 7.6 oz) 97.3 kg (214 lb 8.1 oz) 98.6 kg (217 lb 4.8 oz)    Examination:  General exam: Middle-aged female, moderately built and nourished lying comfortably propped up in bed.  Respiratory system: Clear to auscultation.  No increased work of breathing.  Stable without change. Cardiovascular system: Regular rate rhythm S1-S2 normal.  No pedal edema.  No JVD or murmurs.  Telemetry personally reviewed: Sinus rhythm with BBB morphology. Gastrointestinal system: Abdomen soft, nontender.  Bowel sounds positive.  Stable without change. Central nervous system: Alert and oriented. No focal neurological deficits.  Stable without change. Extremities: Symmetric 5 x 5 power.  Left foot dressing clean and dry. Psychiatry: Judgement and insight appear normal. Mood & affect appropriate.     Data Reviewed: I have personally reviewed following labs and imaging studies  CBC: Recent Labs  Lab 11/06/17 0447 11/07/17 0337 11/08/17 0406 11/08/17 1344 11/09/17 0507  WBC 9.0 7.5 7.2 7.1 8.5  HGB 7.2* 7.3* 7.4* 8.0* 8.3*  HCT 24.8* 24.8* 25.4* 27.1* 28.0*  MCV 96.1 95.0 95.1 94.1 93.3  PLT 441* 399 380 396 431   Basic Metabolic Panel: Recent Labs  Lab 11/05/17 0758 11/08/17 0406 11/08/17 1344  NA 132* 134* 136  K 4.1 3.4* 3.8  CL 95* 97* 99*  CO2 22 24 25   GLUCOSE 152* 98 84  BUN 31* 24* 7  CREATININE 4.44* 4.01* 1.75*  CALCIUM 7.9* 8.3* 8.0*  PHOS 5.4*  --  1.8*   GFR: Estimated Creatinine Clearance: 40.9 mL/min (A) (by C-G formula based on SCr of 1.75 mg/dL (H)). Liver Function Tests: Recent Labs  Lab 11/05/17 0758 11/08/17 1344  ALBUMIN 2.5* 2.6*   Coagulation  Profile: No results for input(s): INR, PROTIME in the last 168 hours. Cardiac Enzymes: Recent Labs  Lab 11/03/17 0345  CKTOTAL 52   CBG: Recent Labs  Lab 11/03/17 1555 11/03/17 1936 11/03/17 2127 11/04/17 0629  GLUCAP 95 119* 133* 130*     Recent Results (from the past 240 hour(s))  Surgical PCR screen     Status: None   Collection Time: 11/01/17 12:38 AM  Result Value Ref Range Status   MRSA, PCR NEGATIVE NEGATIVE Final   Staphylococcus aureus NEGATIVE NEGATIVE Final    Comment: (NOTE) The Xpert SA Assay (FDA approved for NASAL specimens in patients 65 years of age and older), is one component of a comprehensive surveillance program. It is not intended to diagnose infection nor to guide or monitor treatment. Performed at [facility], [place] [address] [place], [place] [address]          Radiology Studies:   Scheduled Meds: . amLODipine  5 mg Oral Daily  . carvedilol  25 mg Oral BID WC  . clopidogrel  75 mg Oral Q  breakfast  . darbepoetin (ARANESP) injection - DIALYSIS  100 mcg Intravenous Q Wed-HD  . gabapentin  100 mg Oral Daily  . hydrALAZINE  50 mg Oral Q8H  . isosorbide mononitrate  120 mg Oral Daily  . multivitamin  1 tablet Oral QHS  . pantoprazole  40 mg Oral Daily  . polyethylene glycol  17 g Oral Daily  . [START ON 11/12/2017] pravastatin  40 mg Oral q1800  . sodium chloride flush  3 mL Intravenous Q12H  . sodium chloride flush  3 mL Intravenous Q12H   Continuous Infusions: . sodium chloride    . sodium chloride 250 mL (11/09/17 0510)  . [START ON 11/10/2017]  ceFAZolin (ANCEF) IV    . cefTRIAXone (ROCEPHIN)  IV Stopped (11/08/17 1842)  . DAPTOmycin (CUBICIN)  IV Stopped (11/08/17 2203)  . heparin 2,250 Units/hr (11/08/17 2306)     LOS: 30 days    Reyne Dumas, MD,  [facility] Pager [phone]  If 7PM-7AM, please contact night-coverage [email or web address] Password [facility] 11/09/2017, 12:22 PM

## 2017-11-10 ENCOUNTER — Inpatient Hospital Stay (HOSPITAL_COMMUNITY): Payer: Medicare HMO

## 2017-11-10 ENCOUNTER — Inpatient Hospital Stay (HOSPITAL_COMMUNITY): Payer: Medicare HMO | Admitting: Certified Registered"

## 2017-11-10 ENCOUNTER — Encounter (HOSPITAL_COMMUNITY)
Admission: AD | Disposition: A | Discharge status: Home Health | Payer: Self-pay | Source: Ambulatory Visit | Attending: Nephrology

## 2017-11-10 DIAGNOSIS — N185 Chronic kidney disease, stage 5: Secondary | ICD-10-CM

## 2017-11-10 DIAGNOSIS — I96 Gangrene, not elsewhere classified: Secondary | ICD-10-CM

## 2017-11-10 HISTORY — PX: AMPUTATION: SHX166

## 2017-11-10 HISTORY — PX: AV FISTULA PLACEMENT: SHX1204

## 2017-11-10 HISTORY — PX: INSERTION OF DIALYSIS CATHETER: SHX1324

## 2017-11-10 LAB — BASIC METABOLIC PANEL
Anion gap: 12 (ref 5–15)
BUN: 17 mg/dL (ref 6–20)
CO2: 22 mmol/L (ref 22–32)
Calcium: 8 mg/dL — ABNORMAL LOW (ref 8.9–10.3)
Chloride: 102 mmol/L (ref 101–111)
Creatinine, Ser: 3.06 mg/dL — ABNORMAL HIGH (ref 0.44–1.00)
GFR calc Af Amer: 18 mL/min — ABNORMAL LOW (ref >60)
GFR calc non Af Amer: 15 mL/min — ABNORMAL LOW (ref >60)
Glucose, Bld: 108 mg/dL — ABNORMAL HIGH (ref 65–99)
Potassium: 3.8 mmol/L (ref 3.5–5.1)
Sodium: 136 mmol/L (ref 135–145)

## 2017-11-10 LAB — RENAL FUNCTION PANEL
Albumin: 2.8 g/dL — ABNORMAL LOW (ref 3.5–5.0)
Anion gap: 10 (ref 5–15)
BUN: 20 mg/dL (ref 6–20)
CO2: 22 mmol/L (ref 22–32)
Calcium: 8.5 mg/dL — ABNORMAL LOW (ref 8.9–10.3)
Chloride: 102 mmol/L (ref 101–111)
Creatinine, Ser: 3.17 mg/dL — ABNORMAL HIGH (ref 0.44–1.00)
GFR calc Af Amer: 17 mL/min — ABNORMAL LOW (ref >60)
GFR calc non Af Amer: 15 mL/min — ABNORMAL LOW (ref >60)
Glucose, Bld: 249 mg/dL — ABNORMAL HIGH (ref 65–99)
Phosphorus: 4.2 mg/dL (ref 2.5–4.6)
Potassium: 4.6 mmol/L (ref 3.5–5.1)
Sodium: 134 mmol/L — ABNORMAL LOW (ref 135–145)

## 2017-11-10 LAB — CBC
HCT: 26.3 % — ABNORMAL LOW (ref 36.0–46.0)
Hemoglobin: 7.9 g/dL — ABNORMAL LOW (ref 12.0–15.0)
MCH: 28 pg (ref 26.0–34.0)
MCHC: 30 g/dL (ref 30.0–36.0)
MCV: 93.3 fL (ref 78.0–100.0)
Platelets: 325 10*3/uL (ref 150–400)
RBC: 2.82 MIL/uL — ABNORMAL LOW (ref 3.87–5.11)
RDW: 21.2 % — ABNORMAL HIGH (ref 11.5–15.5)
WBC: 8 10*3/uL (ref 4.0–10.5)

## 2017-11-10 LAB — PREPARE RBC (CROSSMATCH)

## 2017-11-10 LAB — GLUCOSE, CAPILLARY: Glucose-Capillary: 105 mg/dL — ABNORMAL HIGH (ref 65–99)

## 2017-11-10 LAB — CK: Total CK: 9 U/L — ABNORMAL LOW (ref 38–234)

## 2017-11-10 LAB — HEPARIN LEVEL (UNFRACTIONATED): Heparin Unfractionated: 0.59 IU/mL (ref 0.30–0.70)

## 2017-11-10 SURGERY — INSERTION OF DIALYSIS CATHETER
Anesthesia: General | Site: Foot | Laterality: Right

## 2017-11-10 MED ORDER — SUGAMMADEX SODIUM 200 MG/2ML IV SOLN
INTRAVENOUS | Status: AC
  Filled 2017-11-10: qty 2

## 2017-11-10 MED ORDER — DEXAMETHASONE SODIUM PHOSPHATE 10 MG/ML IJ SOLN
INTRAMUSCULAR | Status: AC
  Filled 2017-11-10: qty 1

## 2017-11-10 MED ORDER — HEPARIN SODIUM (PORCINE) 1000 UNIT/ML IJ SOLN
INTRAMUSCULAR | Status: DC | PRN
  Administered 2017-11-10: 1000 [IU]

## 2017-11-10 MED ORDER — DEXAMETHASONE SODIUM PHOSPHATE 10 MG/ML IJ SOLN
INTRAMUSCULAR | Status: DC | PRN
  Administered 2017-11-10: 10 mg via INTRAVENOUS

## 2017-11-10 MED ORDER — FENTANYL CITRATE (PF) 250 MCG/5ML IJ SOLN
INTRAMUSCULAR | Status: DC | PRN
  Administered 2017-11-10: 25 ug via INTRAVENOUS

## 2017-11-10 MED ORDER — MIDAZOLAM HCL 2 MG/2ML IJ SOLN
INTRAMUSCULAR | Status: AC
  Filled 2017-11-10: qty 2

## 2017-11-10 MED ORDER — LIDOCAINE-EPINEPHRINE (PF) 1 %-1:200000 IJ SOLN
INTRAMUSCULAR | Status: AC
  Filled 2017-11-10: qty 30

## 2017-11-10 MED ORDER — HEPARIN (PORCINE) IN NACL 100-0.45 UNIT/ML-% IJ SOLN
2150.0000 [IU]/h | INTRAMUSCULAR | Status: DC
  Administered 2017-11-10 – 2017-11-11 (×3): 2250 [IU]/h via INTRAVENOUS
  Administered 2017-11-12: 2150 [IU]/h via INTRAVENOUS
  Administered 2017-11-12: 2250 [IU]/h via INTRAVENOUS
  Administered 2017-11-14 – 2017-11-15 (×2): 2150 [IU]/h via INTRAVENOUS
  Filled 2017-11-10 (×9): qty 250

## 2017-11-10 MED ORDER — LIDOCAINE HCL (CARDIAC) 20 MG/ML IV SOLN
INTRAVENOUS | Status: DC | PRN
  Administered 2017-11-10: 100 mg via INTRATRACHEAL

## 2017-11-10 MED ORDER — MIDAZOLAM HCL 5 MG/5ML IJ SOLN
INTRAMUSCULAR | Status: DC | PRN
  Administered 2017-11-10: 1 mg via INTRAVENOUS

## 2017-11-10 MED ORDER — ALBUTEROL SULFATE HFA 108 (90 BASE) MCG/ACT IN AERS
INHALATION_SPRAY | RESPIRATORY_TRACT | Status: AC
  Filled 2017-11-10: qty 13.4

## 2017-11-10 MED ORDER — ALBUMIN HUMAN 5 % IV SOLN
INTRAVENOUS | Status: DC | PRN
  Administered 2017-11-10 (×2): via INTRAVENOUS

## 2017-11-10 MED ORDER — HEPARIN SODIUM (PORCINE) 1000 UNIT/ML IJ SOLN
INTRAMUSCULAR | Status: AC
  Filled 2017-11-10: qty 1

## 2017-11-10 MED ORDER — CALCIUM CHLORIDE 10 % IV SOLN
INTRAVENOUS | Status: AC
  Filled 2017-11-10: qty 10

## 2017-11-10 MED ORDER — PROPOFOL 10 MG/ML IV BOLUS
INTRAVENOUS | Status: AC
  Filled 2017-11-10: qty 20

## 2017-11-10 MED ORDER — HEPARIN SODIUM (PORCINE) 5000 UNIT/ML IJ SOLN
INTRAMUSCULAR | Status: DC | PRN
  Administered 2017-11-10: 11:00:00

## 2017-11-10 MED ORDER — DARBEPOETIN ALFA 100 MCG/0.5ML IJ SOSY
100.0000 ug | PREFILLED_SYRINGE | INTRAMUSCULAR | Status: DC
  Filled 2017-11-10: qty 0.5

## 2017-11-10 MED ORDER — LIDOCAINE 2% (20 MG/ML) 5 ML SYRINGE
INTRAMUSCULAR | Status: AC
  Filled 2017-11-10: qty 5

## 2017-11-10 MED ORDER — ROCURONIUM BROMIDE 100 MG/10ML IV SOLN
INTRAVENOUS | Status: DC | PRN
  Administered 2017-11-10: 20 mg via INTRAVENOUS
  Administered 2017-11-10: 50 mg via INTRAVENOUS

## 2017-11-10 MED ORDER — DARBEPOETIN ALFA 150 MCG/0.3ML IJ SOSY
150.0000 ug | PREFILLED_SYRINGE | INTRAMUSCULAR | Status: DC
  Filled 2017-11-10: qty 0.3

## 2017-11-10 MED ORDER — EPHEDRINE SULFATE 50 MG/ML IJ SOLN
INTRAMUSCULAR | Status: DC | PRN
  Administered 2017-11-10: 30 mg via INTRAVENOUS
  Administered 2017-11-10 (×3): 20 mg via INTRAVENOUS
  Administered 2017-11-10: 10 mg via INTRAVENOUS

## 2017-11-10 MED ORDER — SODIUM CHLORIDE 0.9 % IV SOLN: Freq: Once | INTRAVENOUS | Status: DC

## 2017-11-10 MED ORDER — CALCIUM CHLORIDE 10 % IV SOLN
INTRAVENOUS | Status: DC | PRN
  Administered 2017-11-10: 100 mg via INTRAVENOUS
  Administered 2017-11-10: 200 mg via INTRAVENOUS
  Administered 2017-11-10: 100 mg via INTRAVENOUS
  Administered 2017-11-10: 300 mg via INTRAVENOUS
  Administered 2017-11-10: 100 mg via INTRAVENOUS
  Administered 2017-11-10: 200 mg via INTRAVENOUS

## 2017-11-10 MED ORDER — ONDANSETRON HCL 4 MG/2ML IJ SOLN
INTRAMUSCULAR | Status: AC
  Filled 2017-11-10: qty 2

## 2017-11-10 MED ORDER — SODIUM CHLORIDE 0.9 % IV SOLN
INTRAVENOUS | Status: DC
  Administered 2017-11-10 – 2017-11-13 (×2): via INTRAVENOUS

## 2017-11-10 MED ORDER — PHENYLEPHRINE HCL 10 MG/ML IJ SOLN
INTRAVENOUS | Status: DC | PRN
  Administered 2017-11-10: 50 ug/min via INTRAVENOUS

## 2017-11-10 MED ORDER — ALBUTEROL SULFATE HFA 108 (90 BASE) MCG/ACT IN AERS
INHALATION_SPRAY | RESPIRATORY_TRACT | Status: DC | PRN
  Administered 2017-11-10 (×3): 2 via RESPIRATORY_TRACT

## 2017-11-10 MED ORDER — PHENYLEPHRINE HCL 10 MG/ML IJ SOLN
INTRAMUSCULAR | Status: DC | PRN
  Administered 2017-11-10: 80 ug via INTRAVENOUS
  Administered 2017-11-10 (×2): 120 ug via INTRAVENOUS

## 2017-11-10 MED ORDER — SODIUM CHLORIDE 0.9 % IJ SOLN
INTRAMUSCULAR | Status: AC
  Filled 2017-11-10: qty 40

## 2017-11-10 MED ORDER — PROPOFOL 10 MG/ML IV BOLUS
INTRAVENOUS | Status: DC | PRN
  Administered 2017-11-10: 120 mg via INTRAVENOUS

## 2017-11-10 MED ORDER — 0.9 % SODIUM CHLORIDE (POUR BTL) OPTIME
TOPICAL | Status: DC | PRN
  Administered 2017-11-10: 1000 mL

## 2017-11-10 MED ORDER — FENTANYL CITRATE (PF) 250 MCG/5ML IJ SOLN
INTRAMUSCULAR | Status: AC
  Filled 2017-11-10: qty 5

## 2017-11-10 MED ORDER — LIDOCAINE HCL (PF) 1 % IJ SOLN
INTRAMUSCULAR | Status: AC
  Filled 2017-11-10: qty 30

## 2017-11-10 MED ORDER — ROCURONIUM BROMIDE 10 MG/ML (PF) SYRINGE
PREFILLED_SYRINGE | INTRAVENOUS | Status: AC
  Filled 2017-11-10: qty 5

## 2017-11-10 SURGICAL SUPPLY — 57 items
ARMBAND PINK RESTRICT EXTREMIT (MISCELLANEOUS) ×4 IMPLANT
BANDAGE ELASTIC 4 VELCRO ST LF (GAUZE/BANDAGES/DRESSINGS) ×4 IMPLANT
BIOPATCH RED 1 DISK 7.0 (GAUZE/BANDAGES/DRESSINGS) ×4 IMPLANT
BNDG GAUZE ELAST 4 BULKY (GAUZE/BANDAGES/DRESSINGS) ×4 IMPLANT
CANISTER SUCT 3000ML PPV (MISCELLANEOUS) ×4 IMPLANT
CATH PALINDROME RT-P 15FX19CM (CATHETERS) ×4 IMPLANT
CLIP VESOCCLUDE MED 6/CT (CLIP) ×4 IMPLANT
CLIP VESOCCLUDE SM WIDE 6/CT (CLIP) ×4 IMPLANT
COVER PROBE W GEL 5X96 (DRAPES) ×4 IMPLANT
DERMABOND ADVANCED (GAUZE/BANDAGES/DRESSINGS) ×2
DERMABOND ADVANCED .7 DNX12 (GAUZE/BANDAGES/DRESSINGS) ×6 IMPLANT
DRAPE C-ARM 42X72 X-RAY (DRAPES) ×4 IMPLANT
DRAPE EXTREMITY T 121X128X90 (DRAPE) ×4 IMPLANT
DRAPE HALF SHEET 40X57 (DRAPES) ×4 IMPLANT
DRSG EMULSION OIL 3X3 NADH (GAUZE/BANDAGES/DRESSINGS) ×4 IMPLANT
ELECT REM PT RETURN 9FT ADLT (ELECTROSURGICAL) ×4
ELECTRODE REM PT RTRN 9FT ADLT (ELECTROSURGICAL) ×3 IMPLANT
GAUZE SPONGE 4X4 12PLY STRL (GAUZE/BANDAGES/DRESSINGS) IMPLANT
GAUZE SPONGE 4X4 12PLY STRL LF (GAUZE/BANDAGES/DRESSINGS) ×4 IMPLANT
GAUZE SPONGE 4X4 16PLY XRAY LF (GAUZE/BANDAGES/DRESSINGS) ×4 IMPLANT
GLOVE BIO SURGEON STRL SZ 6.5 (GLOVE) ×4 IMPLANT
GLOVE BIO SURGEON STRL SZ7 (GLOVE) ×4 IMPLANT
GLOVE BIO SURGEON STRL SZ7.5 (GLOVE) ×4 IMPLANT
GLOVE BIO SURGEON STRL SZ8 (GLOVE) ×4 IMPLANT
GLOVE BIOGEL PI IND STRL 6.5 (GLOVE) ×3 IMPLANT
GLOVE BIOGEL PI IND STRL 7.0 (GLOVE) ×3 IMPLANT
GLOVE BIOGEL PI INDICATOR 6.5 (GLOVE) ×1
GLOVE BIOGEL PI INDICATOR 7.0 (GLOVE) ×1
GLOVE SURG SS PI 7.0 STRL IVOR (GLOVE) ×4 IMPLANT
GOWN STRL REUS W/ TWL LRG LVL3 (GOWN DISPOSABLE) ×6 IMPLANT
GOWN STRL REUS W/ TWL XL LVL3 (GOWN DISPOSABLE) ×6 IMPLANT
GOWN STRL REUS W/TWL LRG LVL3 (GOWN DISPOSABLE) ×2
GOWN STRL REUS W/TWL XL LVL3 (GOWN DISPOSABLE) ×2
KIT BASIN OR (CUSTOM PROCEDURE TRAY) ×4 IMPLANT
KIT ROOM TURNOVER OR (KITS) ×4 IMPLANT
NEEDLE 18GX1X1/2 (RX/OR ONLY) (NEEDLE) ×4 IMPLANT
NEEDLE HYPO 25GX1X1/2 BEV (NEEDLE) IMPLANT
NS IRRIG 1000ML POUR BTL (IV SOLUTION) ×4 IMPLANT
PACK CV ACCESS (CUSTOM PROCEDURE TRAY) ×4 IMPLANT
PACK GENERAL/GYN (CUSTOM PROCEDURE TRAY) IMPLANT
PACK SURGICAL SETUP 50X90 (CUSTOM PROCEDURE TRAY) IMPLANT
PAD ARMBOARD 7.5X6 YLW CONV (MISCELLANEOUS) ×8 IMPLANT
SOAP 2 % CHG 4 OZ (WOUND CARE) ×4 IMPLANT
SUT ETHILON 3 0 PS 1 (SUTURE) ×8 IMPLANT
SUT MNCRL AB 4-0 PS2 18 (SUTURE) ×4 IMPLANT
SUT PROLENE 6 0 BV (SUTURE) ×4 IMPLANT
SUT VIC AB 3-0 SH 27 (SUTURE) ×1
SUT VIC AB 3-0 SH 27X BRD (SUTURE) ×3 IMPLANT
SYR 10ML LL (SYRINGE) ×4 IMPLANT
SYR 20CC LL (SYRINGE) ×4 IMPLANT
SYR 5ML LL (SYRINGE) ×4 IMPLANT
SYR CONTROL 10ML LL (SYRINGE) IMPLANT
TOWEL GREEN STERILE (TOWEL DISPOSABLE) ×8 IMPLANT
TOWEL GREEN STERILE FF (TOWEL DISPOSABLE) ×4 IMPLANT
TOWEL OR 17X26 4PK STRL BLUE (TOWEL DISPOSABLE) ×4 IMPLANT
UNDERPAD 30X30 (UNDERPADS AND DIAPERS) ×4 IMPLANT
WATER STERILE IRR 1000ML POUR (IV SOLUTION) ×4 IMPLANT

## 2017-11-10 NOTE — Progress Notes (Signed)
  Progress Note    11/10/2017 9:37 AM Day of Surgery  Subjective: no new complaints  Vitals:   11/10/17 0529 11/10/17 0533  BP: (!) 151/56 (!) 113/47  Pulse:    Resp:    Temp:    SpO2:      Physical Exam: aaox3 Non labored respirations Palpable left radial pulse Right groin is soft, minimal hematoma Palpable left AT pulse Dressing on left groin is cdi  CBC    Component Value Date/Time   WBC 8.0 11/10/2017 0423   RBC 2.82 (L) 11/10/2017 0423   HGB 7.9 (L) 11/10/2017 0423   HGB 14.3 12/14/2016 1139   HCT 26.3 (L) 11/10/2017 0423   HCT 44.2 12/14/2016 1139   PLT 325 11/10/2017 0423   PLT 429 (H) 12/14/2016 1139   MCV 93.3 11/10/2017 0423   MCV 88.5 12/14/2016 1139   MCH 28.0 11/10/2017 0423   MCHC 30.0 11/10/2017 0423   RDW 21.2 (H) 11/10/2017 0423   RDW 17.6 (H) 12/14/2016 1139   LYMPHSABS 0.8 10/26/2017 1200   LYMPHSABS 1.9 12/14/2016 1139   MONOABS 1.9 (H) 10/26/2017 1200   MONOABS 0.7 12/14/2016 1139   EOSABS 0.3 10/26/2017 1200   EOSABS 0.0 10/17/2017 1430   BASOSABS 0.1 10/26/2017 1200   BASOSABS 0.1 12/14/2016 1139    BMET    Component Value Date/Time   NA 136 11/10/2017 0423   NA 137 12/14/2016 1139   K 3.8 11/10/2017 0423   K 4.2 12/14/2016 1139   CL 102 11/10/2017 0423   CO2 22 11/10/2017 0423   CO2 20 (L) 12/14/2016 1139   GLUCOSE 108 (H) 11/10/2017 0423   GLUCOSE 115 12/14/2016 1139   BUN 17 11/10/2017 0423   BUN 32.8 (H) 12/14/2016 1139   CREATININE 3.06 (H) 11/10/2017 0423   CREATININE 2.1 (H) 12/14/2016 1139   CALCIUM 8.0 (L) 11/10/2017 0423   CALCIUM 9.5 12/14/2016 1139   GFRNONAA 15 (L) 11/10/2017 0423   GFRAA 18 (L) 11/10/2017 0423    INR    Component Value Date/Time   INR 1.48 11/01/2017 0451     Intake/Output Summary (Last 24 hours) at 11/10/2017 0937 Last data filed at 11/10/2017 0001 Gross per 24 hour  Intake 120 ml  Output -  Net 120 ml     Assessment:  62 y.o. female is has esrd needs tunneled catheter and  permanent access. Left 2nd toe is open to bone with recent revascularization  Plan: OR today for tdc, left arm avf vs avg and left 2nd toe amputation. Discussed with patient and son at bedside. Consent signed.    [name] C. Donzetta Matters, MD [facility] Office: [phone] Pager: [phone]  11/10/2017 9:37 AM

## 2017-11-10 NOTE — Progress Notes (Signed)
[place] for Heparin Indication: DVT  Patient Measurements: Height: 5\' 8"  (172.7 cm) Weight: 217 lb 14.4 oz (98.8 kg) IBW/kg (Calculated) : 63.9 Heparin Dosing Weight: 95 kg   Assessment:  40 YOF presented on 10/10/17 with worsening renal function and found to have a DVT. Patient is s/p cannulation of femoral and tibial arteries and stenting 11/07/17. Left 2nd toe amputation, left arm AVF, and right tunneled catheter placed on 2/14. Tolerated procedure.  Heparin to resume 4hrs post-procedure. Heparin was therapeutic prior with a gtt of 2250 units/hr. CBC ok, monitor post-op. Pt received 1uPRBCs on 2/12.  Goal of Therapy:  Heparin level 0.3-0.7 units/ml Monitor platelets by anticoagulation protocol: Yes    Plan:  Restart heparin at 2250 units/hr at 1730 HL in 8hrs Daily heparin level/CBC Monitor for s/sx bleeding F/u long-term anticoagulation plan when all procedures complete   Patterson Hammersmith PharmD PGY1 Pharmacy Practice Resident 11/10/2017 1:58 PM Pager: [phone]

## 2017-11-10 NOTE — Progress Notes (Signed)
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 Edrick Oh, Nephrology) discharged from Hospitalist service at [place] on 10/04/17 after 5-day hospital stay for acute osteomyelitis of second left toe with cellulitis and discharged on IV ceftriaxone and daptomycin via PICC line, follow-up creatinine 4.7, associated weakness dyspnea decreased appetite and urine output. Transferred to [facility] due to worsening renal failure, temporary dialysis catheter was placed on 10/19/2016, the patient started dialysis on 10/20/2016, orthopedic and vascular surgery was consulted.  Vascular surgery did  retrograde angiogram and stenting of left SFA on 2/11 and other procedures including toe amputation, left arm AV access and tunneled catheter planned for 2/14   Assessment & Plan:   #Acute kidney injury on chronic kidney disease stage IV now progressed to ESRD: - Fluid overload initially did not improve with diuretics and started on hemodialysis.  -  fluid overload   steadily improving with dialysis and attempts to lower dry weight. - will have conversion to tunneled HD catheter as well as placement of permanent HD/left arm access by vascular surgery  2/14  - For outpatient HD, patient is accepted at [place] at 06:35am - Nephrology following for dialysis needs. Next hemodialysis 8/11  #Acute systolic congestive heart failure/cardiomyopathy:  - EF of 35-40%.   - s/p cath with multi-vessel disease with no target for PCI and no indication for CABG. No chest pain today.  - on   coreg, statin, hydralazine, imdur - Ultrafiltration during dialysis.  - cardiology consult appreciated.  - Statin after stopping  daptomycin  post surgery . -  Improved and stable.  #Anemia of chronic kidney disease, ABLA   - Continue Aranesp and IV iron during the dialysis.  Monitor CBC.  Hemoglobin was  in the low 7 range  , status post   transfusion  1 unit  on 2/12 , hemoglobin now 8.3 >7.9 ,   transfuse  Another unit  during HD 2/14 .  Aranesp dose increased.  Follow CBC in a.m    .  #Chest pain/shortness of breath  - likely due to fluid overload and pulmonary edema: No further chest pain reported.  #Left second toe osteomyelitis/peripheral vascular disease:  -s/p attempted left lower extremity revascularization that was unsuccessful and she has a moderate length segment SFA occlusion on the left and a diminutive proximal SFA on 11/03/2017.   - Remains on IV ceftriaxone and daptomycin which may be dc'd after her toe amputation. - per Vascular surgery s/p  retrograde angiogram and stenting of left SFA  2/11  ,on plavix , asa dc'd by Dr Donzetta Matters.     Subsequent toe amputation on 2/14  #Right lower extremity DVT:  - On IV heparin.  Will probably start patient on Coumadin for this post surgery  #Essential hypertension:  Continue current medication.  Dialysis per nephrology.  Controlled.  #Physical deconditioning: PT OT evaluation >recommend home health PT and OT at discharge..   DVT prophylaxis: heparin Code Status:full code Family Communication: None at bedside today. Disposition Plan:  Plan for tdc, left arm avf vs graft and left 2nd toe amputation on  2/14      Consultants:   Cardiology  Vascular surgery  Orthopedics  Nephrology  Procedures: HD Rest as  above.  Antimicrobials: Rocephin and daptomycin  Subjective:  Resting comfortably, BP variable    Objective: Vitals:   11/09/17 1953 11/10/17 0505 11/10/17 0529 11/10/17 0533  BP: (!) 111/97 (!) 77/54 (!) 151/56 (!) 113/47  Pulse: 74 74    Resp: (!) 22 (!) 22    Temp: 98.6 F (37 C) 98.5 F (36.9 C)    TempSrc: Oral Oral    SpO2: 96% 99%    Weight:   98.8 kg (217 lb 14.4 oz)    Height:        Intake/Output Summary (Last 24 hours) at 11/10/2017 0857 Last data filed at 11/10/2017 0001 Gross per 24 hour  Intake 120 ml  Output -  Net 120 ml   Filed Weights   11/08/17 1223 11/09/17 0429 11/10/17 0505  Weight: 97.3 kg (214 lb 8.1 oz) 98.6 kg (217 lb 4.8 oz) 98.8 kg (217 lb 14.4 oz)    Examination:  General exam: Middle-aged female, moderately built and nourished lying comfortably propped up in bed.  Respiratory system: Clear to auscultation.  No increased work of breathing.  Stable without change. Cardiovascular system: Regular rate rhythm S1-S2 normal.  No pedal edema.  No JVD or murmurs.  Telemetry personally reviewed: Sinus rhythm with BBB morphology. Gastrointestinal system: Abdomen soft, nontender.  Bowel sounds positive.  Stable without change. Central nervous system: Alert and oriented. No focal neurological deficits.  Stable without change. Extremities: Symmetric 5 x 5 power.  Left foot dressing clean and dry. Psychiatry: Judgement and insight appear normal. Mood & affect appropriate.     Data Reviewed: I have personally reviewed following labs and imaging studies  CBC: Recent Labs  Lab 11/07/17 0337 11/08/17 0406 11/08/17 1344 11/09/17 0507 11/10/17 0423  WBC 7.5 7.2 7.1 8.5 8.0  HGB 7.3* 7.4* 8.0* 8.3* 7.9*  HCT 24.8* 25.4* 27.1* 28.0* 26.3*  MCV 95.0 95.1 94.1 93.3 93.3  PLT 399 380 396 371 224   Basic Metabolic Panel: Recent Labs  Lab 11/05/17 0758 11/08/17 0406 11/08/17 1344 11/10/17 0423  NA 132* 134* 136 136  K 4.1 3.4* 3.8 3.8  CL 95* 97* 99* 102  CO2 22 24 25 22   GLUCOSE 152* 98 84 108*  BUN 31* 24* 7 17  CREATININE 4.44* 4.01* 1.75* 3.06*  CALCIUM 7.9* 8.3* 8.0* 8.0*  PHOS 5.4*  --  1.8*  --    GFR: Estimated Creatinine Clearance: 23.4 mL/min (A) (by C-G formula based on SCr of 3.06 mg/dL (H)). Liver Function Tests: Recent Labs  Lab 11/05/17 0758 11/08/17 1344  ALBUMIN 2.5* 2.6*    Coagulation Profile: No results for input(s): INR, PROTIME in the last 168 hours. Cardiac Enzymes: Recent Labs  Lab 11/10/17 0423  CKTOTAL 9*   CBG: Recent Labs  Lab 11/03/17 1555 11/03/17 1936 11/03/17 2127 11/04/17 0629  GLUCAP 95 119* 133* 130*     Recent Results (from the past 240 hour(s))  Surgical PCR screen     Status: None   Collection Time: 11/01/17 12:38 AM  Result Value Ref Range Status   MRSA, PCR NEGATIVE NEGATIVE Final   Staphylococcus aureus NEGATIVE NEGATIVE Final    Comment: (NOTE) The Xpert SA Assay (FDA approved for NASAL specimens in patients 73 years of age and older), is one component of a comprehensive surveillance program. It is not intended to diagnose infection nor to guide or monitor treatment. Performed at [facility], [place] [address] [place], [place] [address]  Radiology Studies:   Scheduled Meds: . amLODipine  5 mg Oral Daily  . carvedilol  25 mg Oral BID WC  . clopidogrel  75 mg Oral Q breakfast  . darbepoetin (ARANESP) injection - DIALYSIS  100 mcg Intravenous Q Thu-HD  . gabapentin  100 mg Oral Daily  . hydrALAZINE  50 mg Oral Q8H  . isosorbide mononitrate  120 mg Oral Daily  . multivitamin  1 tablet Oral QHS  . pantoprazole  40 mg Oral Daily  . polyethylene glycol  17 g Oral Daily  . [START ON 11/12/2017] pravastatin  40 mg Oral q1800  . sodium chloride flush  3 mL Intravenous Q12H  . sodium chloride flush  3 mL Intravenous Q12H   Continuous Infusions: . sodium chloride    . sodium chloride 250 mL (11/09/17 0510)  . sodium chloride    .  ceFAZolin (ANCEF) IV    . cefTRIAXone (ROCEPHIN)  IV 2 g (11/09/17 1626)  . DAPTOmycin (CUBICIN)  IV Stopped (11/08/17 2203)  . heparin Stopped (11/10/17 0850)     LOS: 31 days    Reyne Dumas, MD,  [facility] Pager [phone]  If 7PM-7AM, please contact night-coverage [email or web address] Password [facility] 11/10/2017, 8:57 AM

## 2017-11-10 NOTE — Progress Notes (Signed)
[facility] Progress Note   Subjective: Eating lunch. Says she is having mild pain left foot 4/10. Denies SOB. Going for HD later this PM.   Objective Vitals:   11/10/17 1215 11/10/17 1230 11/10/17 1240 11/10/17 1253  BP: (!) 133/56 (!) 115/54  (!) 118/50  Pulse: 73 71 72 72  Resp: (!) 22 17 17 17   Temp: 97.9 F (36.6 C)   97.7 F (36.5 C)  TempSrc:    Oral  SpO2: 96% 96% 93% 94%  Weight:      Height:       Physical Exam General: Pleasant elderly female in NAD Heart: S1,S2 RRR SR 1st degree AVB on monitor. HR 70s Lungs: CTAB A/P Abdomen: Active BS Extremities: No LE edema. Ace wrap LLE with shadowing on drsg covering L 2nd toe.  Dialysis Access: New LUA AVF + bruit. New RIJ TDC Drsg CDI.     Additional Objective Labs: Basic Metabolic Panel: Recent Labs  Lab 11/05/17 0758 11/08/17 0406 11/08/17 1344 11/10/17 0423  NA 132* 134* 136 136  K 4.1 3.4* 3.8 3.8  CL 95* 97* 99* 102  CO2 22 24 25 22   GLUCOSE 152* 98 84 108*  BUN 31* 24* 7 17  CREATININE 4.44* 4.01* 1.75* 3.06*  CALCIUM 7.9* 8.3* 8.0* 8.0*  PHOS 5.4*  --  1.8*  --    Liver Function Tests: Recent Labs  Lab 11/05/17 0758 11/08/17 1344  ALBUMIN 2.5* 2.6*   No results for input(s): LIPASE, AMYLASE in the last 168 hours. CBC: Recent Labs  Lab 11/07/17 0337 11/08/17 0406 11/08/17 1344 11/09/17 0507 11/10/17 0423  WBC 7.5 7.2 7.1 8.5 8.0  HGB 7.3* 7.4* 8.0* 8.3* 7.9*  HCT 24.8* 25.4* 27.1* 28.0* 26.3*  MCV 95.0 95.1 94.1 93.3 93.3  PLT 399 380 396 371 325   Blood Culture    Component Value Date/Time   SDES BLOOD LEFT HAND 10/19/2017 1500   SPECREQUEST  10/19/2017 1500    BOTTLES DRAWN AEROBIC AND ANAEROBIC Blood Culture adequate volume   CULT NO GROWTH 5 DAYS 10/19/2017 1500   REPTSTATUS 10/24/2017 FINAL 10/19/2017 1500    Cardiac Enzymes: Recent Labs  Lab 11/10/17 0423  CKTOTAL 9*   CBG: Recent Labs  Lab 11/03/17 1555 11/03/17 1936 11/03/17 2127 11/04/17 0629  11/10/17 1221  GLUCAP 95 119* 133* 130* 105*   Iron Studies: No results for input(s): IRON, TIBC, TRANSFERRIN, FERRITIN in the last 72 hours. @lablastinr3 @ Studies/Results: Dg Chest Port 1 View  Result Date: 11/10/2017 CLINICAL DATA:  Placement of a dialysis catheter. EXAM: PORTABLE CHEST 1 VIEW COMPARISON:  Portable chest x-ray of October 29, 2017 FINDINGS: The dialysis catheter tip projects over the midportion of the SVC. There is no postprocedure pneumothorax. There are fluffy interstitial and alveolar airspace opacities bilaterally. The heart is top-normal in size. The pulmonary vascularity is prominent centrally but more distinct than on the earlier study. There is no pleural effusion. There calcification in the wall of the aortic arch. IMPRESSION: No postprocedure complication following dialysis catheter placement. Bilateral interstitial and alveolar opacities consistent with pneumonia or pulmonary edema. Slight improvement since the earlier study. Thoracic aortic atherosclerosis. Electronically Signed   By: David  Martinique M.D.   On: 11/10/2017 12:40   Dg Fluoro Guide Cv Line-no Report  Result Date: 11/10/2017 Fluoroscopy was utilized by the requesting physician.  No radiographic interpretation.   Medications: . sodium chloride Stopped (11/10/17 1210)  . sodium chloride 250 mL (11/09/17 0510)  . sodium  chloride    . sodium chloride 10 mL/hr at 11/10/17 0908  . heparin Stopped (11/10/17 0850)   . amLODipine  5 mg Oral Daily  . carvedilol  25 mg Oral BID WC  . clopidogrel  75 mg Oral Q breakfast  . darbepoetin (ARANESP) injection - DIALYSIS  100 mcg Intravenous Q Thu-HD  . gabapentin  100 mg Oral Daily  . hydrALAZINE  50 mg Oral Q8H  . isosorbide mononitrate  120 mg Oral Daily  . multivitamin  1 tablet Oral QHS  . pantoprazole  40 mg Oral Daily  . polyethylene glycol  17 g Oral Daily  . [START ON 11/12/2017] pravastatin  40 mg Oral q1800  . sodium chloride flush  3 mL Intravenous  Q12H  . sodium chloride flush  3 mL Intravenous Q12H     HD Orders: New ESRD   Assessment/Plan:  1. ESRD- progressive CKD complicated by cardiorenal syndrome. Has been responding well to IHD and is due for another session today. Has TTS schedule first shift at [facility] once stable for discharge. 2. Acute on chronic CHF- improved with HD and UF.  3. CAD- diffuse disease with no PCI targets 4. Anemia of ESRD- HGB down 7.9. Increase ESA dose and give today.  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 Repeat PTH.  6. PVD- left second toe osteomyelitis/PVD. Had 2nd toe amp today per Dr. Donzetta Matters. On Dapto and ceftriaxone.  7. HTN/volume- stable. Nadir wt 95 kg. Wt today 98.6 kg Attempt UFG 2-2.5 liters tomorrow.  8.   Vascular access- Went for placement of TDC and L brachiocephalic AVFplacement per Dr. Donzetta Matters today. TDC placement confirmed per XRAY.    Rita H. Brown NP-C 11/10/2017, 1:58 PM  [employer] [phone]

## 2017-11-10 NOTE — Anesthesia Postprocedure Evaluation (Signed)
Anesthesia Post Note  Patient: Kristen Berry  Procedure(s) Performed: INSERTION OF TUNNELED DIALYSIS CATHETER RIGHT INTERNAL JUGULAR PLACEMENT (Right Chest) ARTERIOVENOUS (AV) FISTULA CREATION LEFT UPPER ARM (Left Arm Upper) AMPUTATION DIGIT SECOND TOE LEFT FOOT (Left Foot)     Patient location during evaluation: PACU Anesthesia Type: General Level of consciousness: sedated Pain management: pain level controlled Vital Signs Assessment: post-procedure vital signs reviewed and stable Respiratory status: spontaneous breathing and respiratory function stable Cardiovascular status: stable Postop Assessment: no apparent nausea or vomiting Anesthetic complications: no    Last Vitals:  Vitals:   11/10/17 1240 11/10/17 1253  BP:  (!) 118/50  Pulse: 72 72  Resp: 17 17  Temp:  36.5 C  SpO2: 93% 94%    Last Pain:  Vitals:   11/10/17 1253  TempSrc: Oral  PainSc:                  Whisper Kurka DANIEL

## 2017-11-10 NOTE — Transfer of Care (Signed)
Immediate Anesthesia Transfer of Care Note  Patient: Kristen Berry  Procedure(s) Performed: INSERTION OF TUNNELED DIALYSIS CATHETER RIGHT INTERNAL JUGULAR PLACEMENT (Right Chest) ARTERIOVENOUS (AV) FISTULA CREATION LEFT UPPER ARM (Left Arm Upper) AMPUTATION DIGIT SECOND TOE LEFT FOOT (Left Foot)  Patient Location: PACU  Anesthesia Type:General  Level of Consciousness: awake and patient cooperative  Airway & Oxygen Therapy: Patient Spontanous Breathing and Patient connected to face mask oxygen  Post-op Assessment: Report given to RN and Post -op Vital signs reviewed and stable  Post vital signs: Reviewed and stable  Last Vitals:  Vitals:   11/10/17 0529 11/10/17 0533  BP: (!) 151/56 (!) 113/47  Pulse:    Resp:    Temp:    SpO2:      Last Pain:  Vitals:   11/10/17 0505  TempSrc: Oral  PainSc:       Patients Stated Pain Goal: 0 (65/03/54 6568)  Complications: No apparent anesthesia complications

## 2017-11-10 NOTE — Anesthesia Procedure Notes (Signed)
Procedure Name: Intubation Date/Time: 11/10/2017 10:33 AM Performed by: Lance Coon, CRNA Pre-anesthesia Checklist: Patient identified, Emergency Drugs available, Suction available, Patient being monitored and Timeout performed Patient Re-evaluated:Patient Re-evaluated prior to induction Oxygen Delivery Method: Circle system utilized Preoxygenation: Pre-oxygenation with 100% oxygen Induction Type: IV induction Ventilation: Mask ventilation without difficulty Laryngoscope Size: Miller and 3 Grade View: Grade I Tube type: Oral Tube size: 7.5 mm Number of attempts: 1 Airway Equipment and Method: Stylet Placement Confirmation: ETT inserted through vocal cords under direct vision,  positive ETCO2 and breath sounds checked- equal and bilateral Secured at: 21 cm Tube secured with: Tape Dental Injury: Teeth and Oropharynx as per pre-operative assessment

## 2017-11-10 NOTE — Anesthesia Preprocedure Evaluation (Addendum)
Anesthesia Evaluation  Patient identified by MRN, date of birth, ID band Patient awake    Reviewed: Allergy & Precautions, NPO status , Patient's Chart, lab work & pertinent test results, reviewed documented beta blocker date and time   History of Anesthesia Complications Negative for: history of anesthetic complications  Airway Mallampati: III  TM Distance: >3 FB Neck ROM: Full    Dental  (+) Dental Advisory Given, Missing, Poor Dentition   Pulmonary sleep apnea ,    Pulmonary exam normal breath sounds clear to auscultation       Cardiovascular hypertension, Pt. on home beta blockers and Pt. on medications + angina with exertion + CAD, + Peripheral Vascular Disease and +CHF  Normal cardiovascular exam Rhythm:Regular Rate:Normal  Cath 10/2017 - Severe diffuse diabetic coronary disease.  Severe diffuse coronary calcification particularly in the left main and LAD territory. 40-50% distal left main. Segmental diffuse ostial to proximal LAD 70-80% stenosis. Unusual circumflex anatomy.  Diffuse distal disease.  No focal high-grade obstruction.  40% obstruction in the mid first obtuse marginal. RCA is dominant.  The continuation of the RCA beyond the PDA contains 75% obstruction.  The mid PDA contains 70% obstruction proximal to bifurcation.  Distal first left ventricular branch contains 70% obstruction. Chronic combined systolic and diastolic heart failure with EF in the 45-50% range.  Severe elevation in LVEDP at 30 mmHg.  TTE 09/2017 - Septal and apical akinesis. LV was  moderately dilated. Mild LVH. EF in the range of 35% to 40%. Mild MS, moderate MR. Mildly dilated LA. PASP: 55 mm Hg (S).   Neuro/Psych negative neurological ROS  negative psych ROS   GI/Hepatic negative GI ROS, Neg liver ROS,   Endo/Other  diabetes, Type 2Obesity  Renal/GU ESRF and DialysisRenal disease  negative genitourinary   Musculoskeletal  (+)  Arthritis ,   Abdominal   Peds  Hematology  (+) anemia ,   Anesthesia Other Findings   Reproductive/Obstetrics                             Anesthesia Physical  Anesthesia Plan  ASA: IV  Anesthesia Plan: General   Post-op Pain Management:    Induction: Intravenous  PONV Risk Score and Plan: 4 or greater and Treatment may vary due to age or medical condition, Ondansetron and Dexamethasone  Airway Management Planned: Oral ETT and LMA  Additional Equipment: None  Intra-op Plan:   Post-operative Plan: Extubation in OR  Informed Consent: I have reviewed the patients History and Physical, chart, labs and discussed the procedure including the risks, benefits and alternatives for the proposed anesthesia with the patient or authorized representative who has indicated his/her understanding and acceptance.   Dental advisory given  Plan Discussed with: CRNA and Anesthesiologist  Anesthesia Plan Comments:        Anesthesia Quick Evaluation

## 2017-11-10 NOTE — Progress Notes (Signed)
[place] for Heparin Indication: DVT  Patient Measurements: Height: 5\' 8"  (172.7 cm) Weight: 217 lb 14.4 oz (98.8 kg) IBW/kg (Calculated) : 63.9 Heparin Dosing Weight: 95 kg   Assessment:  38 YOF presented on 10/10/17 with worsening renal function and found to have a DVT. Patient is s/p cannulation of femoral and tibial arteries and stenting 11/07/17. Plan for [facility], left arm AV fistula versus graft, and L 2nd toe amputation on 2/14.  Heparin resumed post-procedure. Heparin level remains at goal. No bleeding issues noted, hgb stable 7.9 - s/p 1 unit PRBC on 2/12, plt wnl. No bleed documented.  Goal of Therapy:  Heparin level 0.3-0.7 units/ml Monitor platelets by anticoagulation protocol: Yes    Plan:  Continue heparin at 2250 units/hr Daily heparin level/CBC Monitor for s/sx bleeding F/u long-term anticoagulation plan when all procedures complete  [name], PharmD, BCPS Clinical Pharmacist Clinical phone for 11/10/2017 until 3:30pm: [phone] If after 3:30pm, please call main pharmacy at: [phone] 11/10/2017 8:27 AM

## 2017-11-10 NOTE — Op Note (Signed)
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 Donzetta Matters, MD Assistant: Arlee Muslim, PA Procedure Performed: 1.  Right internal jugular tunneled dialysis catheter 2.  Left arm brachiocephalic av fistula creation 3.  Left 2nd toe amputation  Indications: 62 year old female newly diagnosed end-stage renal disease on dialysis via temporary right IJ catheter.  She also has gangrene of her left second toe and is undergone revascularization from a retrograde approach.  She now has palpable anterior tibial pulse at the level of the ankle.  She is indicated for permanent access for dialysis as well as limitation of her second toe.  Findings: The existing catheter in the right IJ was trimmed and used for access and a new 19 cm catheter was placed.  Left arm there was a 5 mm vein and 3 mm artery and fistula was created.  There was significant backbleeding from the vein to suggest possibly a central stenosis and at completion the fistula had pulsatility there was a palpable pulse in the radial artery at the level of the wrist.  Left second toe was removed and there was adequate bleeding in the tissue.   Procedure:  The patient was identified in the holding area and taken to the operating room where she was placed supine on the operative table and general anesthesia was induced.  She was first prepped and draped in her bilateral neck areas of left upper extremity given antibiotics and timeout called.  We began with a tunneled catheter portion of the procedure.  Existing catheter was freed from the skin suture removed a few centimeters and trimmed in half and clamped.  Wire was then placed into the inferior vena cava under fluoroscopic guidance.  The introducer sheath was then exchanged for the catheter over the wire under fluoroscopic guidance as well.  A separate stab  incision was made near the shoulder and the 19 cm catheter was tunneled.  It was then introduced into the superior vena cava where terminated.  It was then assembled flushed easily and locked with 1.5 mL of concentrated heparin.  It was then affixed to the skin with 3-0 nylon suture and the neck incision was closed with 4-0 Monocryl and Dermabond was placed to both sides.  We then turned our attention to the left arm.  Ultrasound was used to identify the cephalic vein throughout the upper extremity which was noted to be approximately 5 mm.  Just below the antecubitum we made a transverse incision where we dissected out the vein divided branches between ties.  The artery was then dissected deeper below the fascia and a vessel loop was placed around it.  The artery was clamped distally and proximally and opened longitudinally.  The vein was then divided marked for orientation and spatulated.  It was then sewn end-to-side with 6-0 Prolene suture.  We had already flushed the artery in both directions with heparinized saline.  Prior to completing our anastomosis we also allowed flushing in both directions.  I completion there was pulsatility in this vein and there has been strong backbleeding to suggest that there was a central stenosis component.  There was a palpable radial pulse.  This wound was irrigated closed in 2 layers with 2-0 Vicryl and 4-0 Monocryl and Dermabond was placed level the skin.  We then prepped and draped to the left foot and a new timeout was called.  Antibiotics were still up to date.  A circular incision around the second toe was then made and periosteal elevator was used to remove the tissue off the bone.  The bone was transected with a bone cutter and rongeur was used to remove the rest piecemeal until it was smooth.  Hemostasis was obtained with irrigated and closed primarily with 3-0 nylon suture.  A dry dressing was placed.  Patient was then allowed away from anesthesia having tolerated  procedure well without immediate complication.  All counts were correct at completion.  Next  EBL 25 cc.   Cylie Dor C. Donzetta Matters, MD [facility] [place] Office: [phone] Pager: [phone]

## 2017-11-11 ENCOUNTER — Encounter (HOSPITAL_COMMUNITY): Payer: Self-pay | Admitting: Vascular Surgery

## 2017-11-11 LAB — CBC
HCT: 27.6 % — ABNORMAL LOW (ref 36.0–46.0)
Hemoglobin: 8.2 g/dL — ABNORMAL LOW (ref 12.0–15.0)
MCH: 27.9 pg (ref 26.0–34.0)
MCHC: 29.7 g/dL — ABNORMAL LOW (ref 30.0–36.0)
MCV: 93.9 fL (ref 78.0–100.0)
Platelets: 342 10*3/uL (ref 150–400)
RBC: 2.94 MIL/uL — ABNORMAL LOW (ref 3.87–5.11)
RDW: 20.7 % — ABNORMAL HIGH (ref 11.5–15.5)
WBC: 7.8 10*3/uL (ref 4.0–10.5)

## 2017-11-11 LAB — PROTIME-INR
INR: 1.36
Prothrombin Time: 16.7 seconds — ABNORMAL HIGH (ref 11.4–15.2)

## 2017-11-11 LAB — BASIC METABOLIC PANEL
Anion gap: 12 (ref 5–15)
BUN: 22 mg/dL — ABNORMAL HIGH (ref 6–20)
CO2: 20 mmol/L — ABNORMAL LOW (ref 22–32)
Calcium: 8.7 mg/dL — ABNORMAL LOW (ref 8.9–10.3)
Chloride: 102 mmol/L (ref 101–111)
Creatinine, Ser: 3.11 mg/dL — ABNORMAL HIGH (ref 0.44–1.00)
GFR calc Af Amer: 17 mL/min — ABNORMAL LOW (ref >60)
GFR calc non Af Amer: 15 mL/min — ABNORMAL LOW (ref >60)
Glucose, Bld: 206 mg/dL — ABNORMAL HIGH (ref 65–99)
Potassium: 4.5 mmol/L (ref 3.5–5.1)
Sodium: 134 mmol/L — ABNORMAL LOW (ref 135–145)

## 2017-11-11 LAB — HEPARIN LEVEL (UNFRACTIONATED)
Heparin Unfractionated: 0.61 IU/mL (ref 0.30–0.70)
Heparin Unfractionated: 0.67 IU/mL (ref 0.30–0.70)

## 2017-11-11 MED ORDER — WARFARIN - PHARMACIST DOSING INPATIENT
Freq: Every day | Status: DC
  Administered 2017-11-12: 18:00:00

## 2017-11-11 MED ORDER — PRO-STAT SUGAR FREE PO LIQD
30.0000 mL | Freq: Two times a day (BID) | ORAL | Status: DC
  Administered 2017-11-13 – 2017-11-16 (×7): 30 mL via ORAL
  Filled 2017-11-11 (×9): qty 30

## 2017-11-11 MED ORDER — DARBEPOETIN ALFA 150 MCG/0.3ML IJ SOSY: 150.0000 ug | PREFILLED_SYRINGE | INTRAMUSCULAR | Status: DC

## 2017-11-11 MED ORDER — ATORVASTATIN CALCIUM 20 MG PO TABS
20.0000 mg | ORAL_TABLET | Freq: Every day | ORAL | Status: DC
  Administered 2017-11-11 – 2017-11-15 (×5): 20 mg via ORAL
  Filled 2017-11-11 (×6): qty 1

## 2017-11-11 MED ORDER — WARFARIN SODIUM 7.5 MG PO TABS
7.5000 mg | ORAL_TABLET | Freq: Once | ORAL | Status: AC
  Administered 2017-11-11: 7.5 mg via ORAL
  Filled 2017-11-11: qty 1

## 2017-11-11 NOTE — Progress Notes (Signed)
[facility] for Heparin/warfarin Indication: DVT  Patient Measurements: Height: 5\' 8"  (172.7 cm) Weight: 218 lb 0.6 oz (98.9 kg) IBW/kg (Calculated) : 63.9 Heparin Dosing Weight: 95 kg   Assessment:  60 YOF presented on 10/10/17 with worsening renal function and found to have a DVT. Patient is s/p cannulation of femoral and tibial arteries and stenting 11/07/17. Left 2nd toe amputation, left arm AVF, and right tunneled catheter placed on 2/14.  Heparin resumed 2/14 post-procedure.Heparin drip rate 225 uts/hr  Heparin level 0.67 remains therapeutic. Pharmacy consulted to start warfarin. Baseline INR 1.37. CBC stable. Pt received 1u PRBCs on 2/12.   Goal of Therapy:  Heparin level 0.3-0.7 units/ml  INR 2-3 Monitor platelets by anticoagulation protocol: Yes   Plan:  Continue heparin at 2250 units/hr Warfarin 7.5mg  PO x 1 Daily heparin level/CBC/INR Monitor for s/sx bleeding   Bonnita Nasuti Pharm.D. CPP, BCPS Clinical Pharmacist [phone] 11/11/2017 7:41 PM

## 2017-11-11 NOTE — Progress Notes (Signed)
Received report from Hemodialysis Unit.  Received patient to room.  Awake, alert and oriented x 4.  Denies pain at this time.

## 2017-11-11 NOTE — Progress Notes (Signed)
Dressing L foot taken down Incision of L 2nd toe is intact with viable skin edges approximated well Dressing saturated with sanguinous drainage however no active bleeding when dressing removed. Dressed with non-adhesive and dry dressing Encouraged elevation of L foot when patient is not mobile Ok for discharge from vascular standpoint, patient will follow up with Dr. Donzetta Matters in 2 weeks

## 2017-11-11 NOTE — Progress Notes (Signed)
PT Cancellation Note  Patient Details Name: Kristen Berry MRN: [id number] DOB: 01/18/1956   Cancelled Treatment:    Reason Eval/Treat Not Completed: (P) Patient at procedure or test/unavailable(Pt off unit for dialysis.  Will continue effort per POC.  )   [name] [name] 11/11/2017, 4:24 PM  [name], PTA pager [phone]

## 2017-11-11 NOTE — Progress Notes (Signed)
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 Edrick Oh, Nephrology) discharged from Hospitalist service at [facility] on 10/04/17 after 5-day hospital stay for acute osteomyelitis of second left toe with cellulitis  Transferred to [facility] due to worsening renal failure, temporary dialysis catheter was placed on 10/19/2016, the patient started dialysis on 10/20/2016,   Vascular surgery did  retrograde angiogram and stenting of left SFA on 2/11 and other procedures including toe amputation, left arm AV access and tunneled catheter planned for 2/14   Patient was seen on dialysis and is waiting discharge     Objective:  Vital signs in last 24 hours:  Temp:  [97.7 F (36.5 C)-98 F (36.7 C)] 97.7 F (36.5 C) (02/15 0727) Pulse Rate:  [53-97] 93 (02/15 1115) Resp:  [15-22] 18 (02/15 0730) BP: (87-136)/(43-115) 98/46 (02/15 1115) SpO2:  [93 %-99 %] 99 % (02/15 0727) Weight:  [223 lb 12.3 oz (101.5 kg)-224 lb 6.4 oz (101.8 kg)] 223 lb 12.3 oz (101.5 kg) (02/15 0727)  Weight change: 6 lb 8 oz (2.948 kg) Filed Weights   11/10/17 0505 11/11/17 0421 11/11/17 0727  Weight: 217 lb 14.4 oz (98.8 kg) 224 lb 6.4 oz (101.8 kg) 223 lb 12.3 oz (101.5 kg)    Intake/Output: I/O last 3 completed shifts: In: 1973.9 [P.O.:120; I.V.:1353.9; IV Piggyback:500] Out: 30 [Blood:30]   Intake/Output this shift:  No intake/output data recorded.  General: Pleasant elderly female in NAD Heart: S1,S2 RRR SR 1st degree AVB on monitor. HR 70s Lungs: CTAB A/P Abdomen: Active BS Extremities: No LE edema. Ace wrap LLE with shadowing on drsg covering L 2nd toe.  Dialysis Access: New LUA AVF + bruit. New RIJ TDC Drsg CDI.      Basic Metabolic Panel: Recent Labs  Lab 11/05/17 0758 11/08/17 0406 11/08/17 1344 11/10/17 0423 11/10/17 1602 11/11/17 0200   NA 132* 134* 136 136 134* 134*  K 4.1 3.4* 3.8 3.8 4.6 4.5  CL 95* 97* 99* 102 102 102  CO2 22 24 25 22 22  20*  GLUCOSE 152* 98 84 108* 249* 206*  BUN 31* 24* 7 17 20  22*  CREATININE 4.44* 4.01* 1.75* 3.06* 3.17* 3.11*  CALCIUM 7.9* 8.3* 8.0* 8.0* 8.5* 8.7*  PHOS 5.4*  --  1.8*  --  4.2  --     Liver Function Tests: Recent Labs  Lab 11/05/17 0758 11/08/17 1344 11/10/17 1602  ALBUMIN 2.5* 2.6* 2.8*   No results for input(s): LIPASE, AMYLASE in the last 168 hours. No results for input(s): AMMONIA in the last 168 hours.  CBC: Recent Labs  Lab 11/08/17 0406 11/08/17 1344 11/09/17 0507 11/10/17 0423 11/11/17 0200  WBC 7.2 7.1 8.5 8.0 7.8  HGB 7.4* 8.0* 8.3* 7.9* 8.2*  HCT 25.4* 27.1* 28.0* 26.3* 27.6*  MCV 95.1 94.1 93.3 93.3 93.9  PLT 380 396 371 325 342    Cardiac Enzymes: Recent Labs  Lab 11/10/17 0423  CKTOTAL 9*    BNP: Invalid input(s): POCBNP  CBG: Recent Labs  Lab 11/10/17 1221  GLUCAP 105*    Microbiology: Results for orders placed or performed during the hospital encounter of 10/10/17  Culture, blood (routine x 2)     Status: None   Collection Time: 10/19/17  2:56 PM  Result Value Ref Range Status   Specimen Description BLOOD LEFT ANTECUBITAL  Final  Special Requests   Final    BOTTLES DRAWN AEROBIC AND ANAEROBIC Blood Culture results may not be optimal due to an inadequate volume of blood received in culture bottles   Culture NO GROWTH 5 DAYS  Final   Report Status 10/24/2017 FINAL  Final  Culture, blood (routine x 2)     Status: None   Collection Time: 10/19/17  3:00 PM  Result Value Ref Range Status   Specimen Description BLOOD LEFT HAND  Final   Special Requests   Final    BOTTLES DRAWN AEROBIC AND ANAEROBIC Blood Culture adequate volume   Culture NO GROWTH 5 DAYS  Final   Report Status 10/24/2017 FINAL  Final  Surgical PCR screen     Status: None   Collection Time: 11/01/17 12:38 AM  Result Value Ref Range Status   MRSA, PCR  NEGATIVE NEGATIVE Final   Staphylococcus aureus NEGATIVE NEGATIVE Final    Comment: (NOTE) The Xpert SA Assay (FDA approved for NASAL specimens in patients 55 years of age and older), is one component of a comprehensive surveillance program. It is not intended to diagnose infection nor to guide or monitor treatment. Performed at [facility], [place] [address] [place], [place] [address]     Coagulation Studies: No results for input(s): LABPROT, INR in the last 72 hours.  Urinalysis: No results for input(s): COLORURINE, LABSPEC, PHURINE, GLUCOSEU, HGBUR, BILIRUBINUR, KETONESUR, PROTEINUR, UROBILINOGEN, NITRITE, LEUKOCYTESUR in the last 72 hours.  Invalid input(s): APPERANCEUR    Imaging: Dg Chest Port 1 View  Result Date: 11/10/2017 CLINICAL DATA:  Placement of a dialysis catheter. EXAM: PORTABLE CHEST 1 VIEW COMPARISON:  Portable chest x-ray of October 29, 2017 FINDINGS: The dialysis catheter tip projects over the midportion of the SVC. There is no postprocedure pneumothorax. There are fluffy interstitial and alveolar airspace opacities bilaterally. The heart is top-normal in size. The pulmonary vascularity is prominent centrally but more distinct than on the earlier study. There is no pleural effusion. There calcification in the wall of the aortic arch. IMPRESSION: No postprocedure complication following dialysis catheter placement. Bilateral interstitial and alveolar opacities consistent with pneumonia or pulmonary edema. Slight improvement since the earlier study. Thoracic aortic atherosclerosis. Electronically Signed   By: David  Martinique M.D.   On: 11/10/2017 12:40   Dg Fluoro Guide Cv Line-no Report  Result Date: 11/10/2017 Fluoroscopy was utilized by the requesting physician.  No radiographic interpretation.     Medications:   . sodium chloride Stopped (11/10/17 1210)  . sodium chloride 250 mL (11/09/17 0510)  . sodium chloride    . sodium chloride 10 mL/hr at 11/10/17  0908  . heparin 2,250 Units/hr (11/11/17 0431)   . amLODipine  5 mg Oral Daily  . atorvastatin  20 mg Oral q1800  . carvedilol  25 mg Oral BID WC  . clopidogrel  75 mg Oral Q breakfast  . darbepoetin (ARANESP) injection - DIALYSIS  150 mcg Intravenous Q Fri-HD  . [START ON 11/17/2017] darbepoetin (ARANESP) injection - DIALYSIS  150 mcg Intravenous Q Thu-HD  . gabapentin  100 mg Oral Daily  . hydrALAZINE  50 mg Oral Q8H  . isosorbide mononitrate  120 mg Oral Daily  . multivitamin  1 tablet Oral QHS  . pantoprazole  40 mg Oral Daily  . polyethylene glycol  17 g Oral Daily  . sodium chloride flush  3 mL Intravenous Q12H  . sodium chloride flush  3 mL Intravenous Q12H   sodium chloride, sodium chloride,  acetaminophen, camphor-menthol **AND** hydrOXYzine, docusate sodium, feeding supplement (NEPRO CARB STEADY), heparin, hydrALAZINE, hydrALAZINE, ipratropium-albuterol, labetalol, nitroGLYCERIN, ondansetron (ZOFRAN) IV, oxyCODONE, sodium chloride flush, sodium chloride flush, sodium chloride flush, sorbitol, zolpidem  Assessment/ Plan:   1. ESRD- progressive CKD complicated by cardiorenal syndrome. Has been responding well to IHD  Has TTS schedule first shift at [facility] once stable for discharge. 2. Acute on chronic CHF- improved with HD and UF.  3. CAD- diffuse disease with no PCI targets 4. Anemia of ESRD- HGB down 7.9.     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 PVD- left second toeosteomyelitis/PVD. Had 2ndtoe amp today per Dr. Donzetta Matters.   7. HTN/volume- stable. Nadir wt 95 kg. Wt today 98.6 kg Attempt UFG 2-2.5 liters tomorrow.  8.Vascular access-Went for placement of TDC and L brachiocephalic       LOS: 24 Avalyn Molino W @TODAY @11 :27 AM

## 2017-11-11 NOTE — Plan of Care (Incomplete)
  Progressing Activity: Ability to tolerate increased activity will improve 11/11/2017 2149 - Progressing by Blair Promise, RN Education: Knowledge of disease and its progression will improve 11/11/2017 2149 - Progressing by Blair Promise, RN Fluid Volume: Compliance with measures to maintain balanced fluid volume will improve 11/11/2017 2149 - Progressing by Blair Promise, RN Health Behavior/Discharge Planning: Ability to manage health-related needs will improve 11/11/2017 2149 - Progressing by Blair Promise, RN Nutritional: Ability to make healthy dietary choices will improve 11/11/2017 2149 - Progressing by Blair Promise, RN Physical Regulation: Complications related to the disease process, condition or treatment will be avoided or minimized 11/11/2017 2149 - Progressing by Blair Promise, RN

## 2017-11-11 NOTE — Progress Notes (Signed)
OT Cancellation Note  Patient Details Name: Kristen Berry MRN: [id number] DOB: 09-18-1956   Cancelled Treatment:    Reason Eval/Treat Not Completed: Patient at procedure or test/ unavailable(currently in HD)  Binnie Kand M.S., OTR/L  Pager: [phone]  11/11/2017, 7:35 AM

## 2017-11-11 NOTE — Progress Notes (Addendum)
  Progress Note    11/11/2017 8:20 AM 1 Day Post-Op  Subjective:  Seen during HD today.  R IJ [place] working well during treatment.   Vitals:   11/11/17 0730 11/11/17 0745  BP: (!) 100/50 119/68  Pulse: 94 93  Resp: 18   Temp:    SpO2:     Physical Exam: Lungs:  No increased respiratory effort Incisions:  L AC fossa incision with some fluctuance, likely fluid collection, however still soft; palpable thrill and audible bruit to the level of mid upper arm; palpable L radial pulse Extremities:  L foot dressing left in place Abdomen:  Soft Neurologic: A&O  CBC    Component Value Date/Time   WBC 7.8 11/11/2017 0200   RBC 2.94 (L) 11/11/2017 0200   HGB 8.2 (L) 11/11/2017 0200   HGB 14.3 12/14/2016 1139   HCT 27.6 (L) 11/11/2017 0200   HCT 44.2 12/14/2016 1139   PLT 342 11/11/2017 0200   PLT 429 (H) 12/14/2016 1139   MCV 93.9 11/11/2017 0200   MCV 88.5 12/14/2016 1139   MCH 27.9 11/11/2017 0200   MCHC 29.7 (L) 11/11/2017 0200   RDW 20.7 (H) 11/11/2017 0200   RDW 17.6 (H) 12/14/2016 1139   LYMPHSABS 0.8 10/26/2017 1200   LYMPHSABS 1.9 12/14/2016 1139   MONOABS 1.9 (H) 10/26/2017 1200   MONOABS 0.7 12/14/2016 1139   EOSABS 0.3 10/26/2017 1200   EOSABS 0.0 10/17/2017 1430   BASOSABS 0.1 10/26/2017 1200   BASOSABS 0.1 12/14/2016 1139    BMET    Component Value Date/Time   NA 134 (L) 11/11/2017 0200   NA 137 12/14/2016 1139   K 4.5 11/11/2017 0200   K 4.2 12/14/2016 1139   CL 102 11/11/2017 0200   CO2 20 (L) 11/11/2017 0200   CO2 20 (L) 12/14/2016 1139   GLUCOSE 206 (H) 11/11/2017 0200   GLUCOSE 115 12/14/2016 1139   BUN 22 (H) 11/11/2017 0200   BUN 32.8 (H) 12/14/2016 1139   CREATININE 3.11 (H) 11/11/2017 0200   CREATININE 2.1 (H) 12/14/2016 1139   CALCIUM 8.7 (L) 11/11/2017 0200   CALCIUM 9.5 12/14/2016 1139   GFRNONAA 15 (L) 11/11/2017 0200   GFRAA 17 (L) 11/11/2017 0200    INR    Component Value Date/Time   INR 1.48 11/01/2017 0451      Intake/Output Summary (Last 24 hours) at 11/11/2017 0820 Last data filed at 11/10/2017 1800 Gross per 24 hour  Intake 1853.92 ml  Output 30 ml  Net 1823.92 ml     Assessment/Plan:  62 y.o. female is s/p R IJ TDC, L arm brachiocephalic fistula creation, L 2nd toe amputation 1 Day Post-Op   L foot dressing down tomorrow Patent L brachiocephalic fistula with palpable thrill and no signs/symptoms of steal syndrome Continue HD from [facility] for now Ok to transition from IV heparin to oral anticoagulation from vascular surgery standpoint for DVT  DVT prophylaxis:  IV heparin   Dagoberto Ligas, PA-C [facility] [phone] 11/11/2017 8:20 AM  I have independently interviewed and examined patient and agree with PA assessment and plan above. Will need f/u in a few weeks to check on toe amputation site.   Heberto Sturdevant C. Donzetta Matters, MD [facility] [place] Office: [phone] Pager: [phone]

## 2017-11-11 NOTE — Progress Notes (Signed)
[place] for Heparin Indication: DVT  Patient Measurements: Height: 5\' 8"  (172.7 cm) Weight: 217 lb 14.4 oz (98.8 kg) IBW/kg (Calculated) : 63.9 Heparin Dosing Weight: 95 kg   Assessment:  39 YOF presented on 10/10/17 with worsening renal function and found to have a DVT. Patient is s/p cannulation of femoral and tibial arteries and stenting 11/07/17. Left 2nd toe amputation, left arm AVF, and right tunneled catheter placed on 2/14. Tolerated procedure.  Heparin resumed 4hrs post-procedure. Heparin was therapeutic prior with a gtt of 2250 units/hr. CBC ok, monitor post-op. Pt received 1uPRBCs on 2/12.  2/15 AM: heparin level therapeutic after re-start s/p procedure   Goal of Therapy:  Heparin level 0.3-0.7 units/ml Monitor platelets by anticoagulation protocol: Yes   Plan:  Cont heparin at 2250 units/hr [place], PharmD, [place] Pharmacist Phone: [phone]

## 2017-11-11 NOTE — Progress Notes (Signed)
[facility] for Heparin/warfarin Indication: DVT  Patient Measurements: Height: 5\' 8"  (172.7 cm) Weight: 223 lb 12.3 oz (101.5 kg) IBW/kg (Calculated) : 63.9 Heparin Dosing Weight: 95 kg   Assessment:  48 YOF presented on 10/10/17 with worsening renal function and found to have a DVT. Patient is s/p cannulation of femoral and tibial arteries and stenting 11/07/17. Left 2nd toe amputation, left arm AVF, and right tunneled catheter placed on 2/14.  Heparin resumed 2/14 post-procedure. Heparin remains therapeutic. Pharmacy consulted to start warfarin. Baseline INR pending. CBC stable. Pt received 1u PRBCs on 2/12.  Confirmatory heparin level and INR to be pushed out to 4 hours post-HD, as patient currently in HD when these orders were placed.  Goal of Therapy:  Heparin level 0.3-0.7 units/ml  INR 2-3 Monitor platelets by anticoagulation protocol: Yes   Plan:  Continue heparin at 2250 units/hr Warfarin 7.5mg  PO x 1 (confirm baseline INR prior to entering dose) Confirmatory heparin level, baseline INR pending Daily heparin level/CBC/INR Monitor for s/sx bleeding  [name], PharmD, BCPS Clinical Pharmacist Clinical phone for 11/11/2017 until 3:30pm: [phone] If after 3:30pm, please call main pharmacy at: [phone] 11/11/2017 10:49 AM

## 2017-11-11 NOTE — Progress Notes (Signed)
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 Edrick Oh, Nephrology) discharged from Hospitalist service at [facility] on 10/04/17 after 5-day hospital stay for acute osteomyelitis of second left toe with cellulitis and discharged on IV ceftriaxone and daptomycin via PICC line, follow-up creatinine 4.7, associated weakness dyspnea decreased appetite and urine output. Transferred to [facility] due to worsening renal failure, temporary dialysis catheter was placed on 10/19/2016, the patient started dialysis on 10/20/2016, orthopedic and vascular surgery was consulted.  Vascular surgery did  retrograde angiogram and stenting of left SFA on 2/11 and other procedures including toe amputation, left arm AV access and tunneled catheter on  2/14 . Anticoagulation with iv heparin resumed post op due to extensive b/L DVT's documented on 2/1. Also started on coumadin .   Assessment & Plan:   #Acute kidney injury on chronic kidney disease stage IV now progressed to ESRD: -  Fluid overload initially did not improve with diuretics and started on hemodialysis.  -  fluid overload   steadily improving with dialysis and attempts to lower dry weight. - converted to Rt IJ  tunneled HD catheter as well as placement of permanent HD/left arm access by vascular surgery  2/14  - For outpatient HD, patient is accepted at [facility] at 06:35am - Nephrology following for dialysis needs. Next hemodialysis 2/59  #Acute systolic congestive heart failure/cardiomyopathy:  - EF of 35-40%.   - s/p cath with multi-vessel disease with no target for PCI and no indication for CABG. No chest pain  .  - on   coreg, statin, hydralazine, imdur -  Ultrafiltration during dialysis.  - cardiology consult appreciated.  - Statin resumed after  stopping  daptomycin post surgery . -  Improved and stable.  #Anemia of chronic kidney disease, ABLA   - Continue Aranesp and IV iron during the dialysis.  Monitor CBC.  Hemoglobin was  in the low 7 range  , status post   transfusion  1 unit  on 2/12 , hemoglobin now 8.3 >7.9 ,   transfused  Another unit  during HD 2/14 . Hg upto  8.2 . Aranesp   Follow CBC in a.m    .  #Chest pain/shortness of breath  - likely due to fluid overload and pulmonary edema: No further chest pain reported.  #Left second toe osteomyelitis/peripheral vascular disease:  -s/p attempted left lower extremity revascularization that was unsuccessful and she has a moderate length segment SFA occlusion on the left and a diminutive proximal SFA on 11/03/2017.   -per Vascular surgery s/p  retrograde angiogram and stenting of left SFA  2/11  ,on plavix , asa dc'd by Dr Donzetta Matters.     Subsequent toe amputation on 2/14  Post op , we discontinued  IV ceftriaxone and daptomycin  after her toe amputation, ok to do so per Dr Donzetta Matters.  #Right lower extremity DVT:  - On IV heparin. Started on  Coumadin , will need therapeutic INR prior to DC , given extensive B/L DVT seen on USG 2/1  #Essential hypertension:  Continue current medication.  Dialysis per nephrology.  Controlled.  #Physical deconditioning: PT OT evaluation >recommend home health PT and OT at discharge..   DVT prophylaxis: heparin Code Status:full code  Family Communication: None at bedside today. Disposition Plan:   Need INR to be therapeutic prior to dc      Consultants:   Cardiology  Vascular surgery  Orthopedics  Nephrology  Procedures: HD Rest as above.  Antimicrobials: Rocephin and daptomycin  Subjective:  Denies any chest pain, shortness of breath,, seen in hemodialysis  Objective: Vitals:   11/11/17 0421 11/11/17 0727 11/11/17 0730 11/11/17 0745  BP:  (!) 120/98 (!) 94/43 (!) 100/50 119/68  Pulse: 95 (!) 54 94 93  Resp: 18 18 18    Temp: 98 F (36.7 C) 97.7 F (36.5 C)    TempSrc: Oral Oral    SpO2: 94% 99%    Weight: 101.8 kg (224 lb 6.4 oz) 101.5 kg (223 lb 12.3 oz)    Height:        Intake/Output Summary (Last 24 hours) at 11/11/2017 0918 Last data filed at 11/10/2017 1800 Gross per 24 hour  Intake 1853.92 ml  Output 30 ml  Net 1823.92 ml   Filed Weights   11/10/17 0505 11/11/17 0421 11/11/17 0727  Weight: 98.8 kg (217 lb 14.4 oz) 101.8 kg (224 lb 6.4 oz) 101.5 kg (223 lb 12.3 oz)    Examination:  General exam: Middle-aged female, moderately built and nourished lying comfortably propped up in bed.  Respiratory system: Clear to auscultation.  No increased work of breathing.  Stable without change. Cardiovascular system: Regular rate rhythm S1-S2 normal.  No pedal edema.  No JVD or murmurs.  Telemetry personally reviewed: Sinus rhythm with BBB morphology. Gastrointestinal system: Abdomen soft, nontender.  Bowel sounds positive.  Stable without change. Central nervous system: Alert and oriented. No focal neurological deficits.  Stable without change. Extremities: Symmetric 5 x 5 power.  Left foot dressing clean and dry. Psychiatry: Judgement and insight appear normal. Mood & affect appropriate.     Data Reviewed: I have personally reviewed following labs and imaging studies  CBC: Recent Labs  Lab 11/08/17 0406 11/08/17 1344 11/09/17 0507 11/10/17 0423 11/11/17 0200  WBC 7.2 7.1 8.5 8.0 7.8  HGB 7.4* 8.0* 8.3* 7.9* 8.2*  HCT 25.4* 27.1* 28.0* 26.3* 27.6*  MCV 95.1 94.1 93.3 93.3 93.9  PLT 380 396 371 325 854   Basic Metabolic Panel: Recent Labs  Lab 11/05/17 0758 11/08/17 0406 11/08/17 1344 11/10/17 0423 11/10/17 1602 11/11/17 0200  NA 132* 134* 136 136 134* 134*  K 4.1 3.4* 3.8 3.8 4.6 4.5  CL 95* 97* 99* 102 102 102  CO2 22 24 25 22 22  20*  GLUCOSE 152* 98 84 108* 249* 206*  BUN 31* 24* 7 17 20  22*    CREATININE 4.44* 4.01* 1.75* 3.06* 3.17* 3.11*  CALCIUM 7.9* 8.3* 8.0* 8.0* 8.5* 8.7*  PHOS 5.4*  --  1.8*  --  4.2  --    GFR: Estimated Creatinine Clearance: 23.4 mL/min (A) (by C-G formula based on SCr of 3.11 mg/dL (H)). Liver Function Tests: Recent Labs  Lab 11/05/17 0758 11/08/17 1344 11/10/17 1602  ALBUMIN 2.5* 2.6* 2.8*   Coagulation Profile: No results for input(s): INR, PROTIME in the last 168 hours. Cardiac Enzymes: Recent Labs  Lab 11/10/17 0423  CKTOTAL 9*   CBG: Recent Labs  Lab 11/10/17 1221  GLUCAP 105*     No results found for this or any previous visit (from the past 240 hour(s)).       Radiology Studies:   Scheduled Meds: . amLODipine  5 mg Oral Daily  . carvedilol  25 mg  Oral BID WC  . clopidogrel  75 mg Oral Q breakfast  . darbepoetin (ARANESP) injection - DIALYSIS  150 mcg Intravenous Q Thu-HD  . gabapentin  100 mg Oral Daily  . hydrALAZINE  50 mg Oral Q8H  . isosorbide mononitrate  120 mg Oral Daily  . multivitamin  1 tablet Oral QHS  . pantoprazole  40 mg Oral Daily  . polyethylene glycol  17 g Oral Daily  . [START ON 11/12/2017] pravastatin  40 mg Oral q1800  . sodium chloride flush  3 mL Intravenous Q12H  . sodium chloride flush  3 mL Intravenous Q12H   Continuous Infusions: . sodium chloride Stopped (11/10/17 1210)  . sodium chloride 250 mL (11/09/17 0510)  . sodium chloride    . sodium chloride 10 mL/hr at 11/10/17 0908  . heparin 2,250 Units/hr (11/11/17 0431)     LOS: 32 days    Reyne Dumas, MD,  [facility] Pager [phone]  If 7PM-7AM, please contact night-coverage [email or web address] Password [facility] 11/11/2017, 9:18 AM

## 2017-11-12 LAB — RENAL FUNCTION PANEL
Albumin: 2.5 g/dL — ABNORMAL LOW (ref 3.5–5.0)
Anion gap: 11 (ref 5–15)
BUN: 18 mg/dL (ref 6–20)
CO2: 25 mmol/L (ref 22–32)
Calcium: 8 mg/dL — ABNORMAL LOW (ref 8.9–10.3)
Chloride: 97 mmol/L — ABNORMAL LOW (ref 101–111)
Creatinine, Ser: 2.4 mg/dL — ABNORMAL HIGH (ref 0.44–1.00)
GFR calc Af Amer: 24 mL/min — ABNORMAL LOW (ref >60)
GFR calc non Af Amer: 21 mL/min — ABNORMAL LOW (ref >60)
Glucose, Bld: 138 mg/dL — ABNORMAL HIGH (ref 65–99)
Phosphorus: 2.7 mg/dL (ref 2.5–4.6)
Potassium: 3.2 mmol/L — ABNORMAL LOW (ref 3.5–5.1)
Sodium: 133 mmol/L — ABNORMAL LOW (ref 135–145)

## 2017-11-12 LAB — CBC
HCT: 26.3 % — ABNORMAL LOW (ref 36.0–46.0)
Hemoglobin: 7.7 g/dL — ABNORMAL LOW (ref 12.0–15.0)
MCH: 27.6 pg (ref 26.0–34.0)
MCHC: 29.3 g/dL — ABNORMAL LOW (ref 30.0–36.0)
MCV: 94.3 fL (ref 78.0–100.0)
Platelets: 319 10*3/uL (ref 150–400)
RBC: 2.79 MIL/uL — ABNORMAL LOW (ref 3.87–5.11)
RDW: 20.7 % — ABNORMAL HIGH (ref 11.5–15.5)
WBC: 8.3 10*3/uL (ref 4.0–10.5)

## 2017-11-12 LAB — TYPE AND SCREEN
ABO/RH(D): AB NEG
Antibody Screen: NEGATIVE
Unit division: 0
Unit division: 0

## 2017-11-12 LAB — PROTIME-INR
INR: 1.4
Prothrombin Time: 17.1 seconds — ABNORMAL HIGH (ref 11.4–15.2)

## 2017-11-12 LAB — BPAM RBC
Blood Product Expiration Date: 201902222359
Blood Product Expiration Date: 201902252359
ISSUE DATE / TIME: 201902121017
ISSUE DATE / TIME: 201902121353
Unit Type and Rh: 600
Unit Type and Rh: 600

## 2017-11-12 LAB — HEPARIN LEVEL (UNFRACTIONATED): Heparin Unfractionated: 0.72 IU/mL — ABNORMAL HIGH (ref 0.30–0.70)

## 2017-11-12 MED ORDER — SODIUM CHLORIDE 0.9 % IV SOLN: 100.0000 mL | INTRAVENOUS | Status: DC | PRN

## 2017-11-12 MED ORDER — LIDOCAINE-PRILOCAINE 2.5-2.5 % EX CREA
1.0000 "application " | TOPICAL_CREAM | CUTANEOUS | Status: DC | PRN
  Filled 2017-11-12: qty 5

## 2017-11-12 MED ORDER — PENTAFLUOROPROP-TETRAFLUOROETH EX AERO: 1.0000 "application " | INHALATION_SPRAY | CUTANEOUS | Status: DC | PRN

## 2017-11-12 MED ORDER — ALTEPLASE 2 MG IJ SOLR: 2.0000 mg | Freq: Once | INTRAMUSCULAR | Status: DC | PRN

## 2017-11-12 MED ORDER — WARFARIN SODIUM 7.5 MG PO TABS
7.5000 mg | ORAL_TABLET | Freq: Once | ORAL | Status: AC
  Administered 2017-11-12: 7.5 mg via ORAL
  Filled 2017-11-12: qty 1

## 2017-11-12 MED ORDER — DARBEPOETIN ALFA 150 MCG/0.3ML IJ SOSY
PREFILLED_SYRINGE | INTRAMUSCULAR | Status: AC
  Administered 2017-11-12: 150 ug via INTRAVENOUS
  Filled 2017-11-12: qty 0.3

## 2017-11-12 MED ORDER — DARBEPOETIN ALFA 150 MCG/0.3ML IJ SOSY
150.0000 ug | PREFILLED_SYRINGE | INTRAMUSCULAR | Status: DC
Start: 2017-11-12 — End: 2017-11-16
  Administered 2017-11-12: 150 ug via INTRAVENOUS
  Filled 2017-11-12: qty 0.3

## 2017-11-12 MED ORDER — LIDOCAINE HCL (PF) 1 % IJ SOLN: 5.0000 mL | INTRAMUSCULAR | Status: DC | PRN

## 2017-11-12 NOTE — Progress Notes (Signed)
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 Creatinine rose to 4.7 from baseline of 3 and patient was admitted at [facility] 10/10/2017. Temporary dialysis catheter was placed on 10/19/2016, HD started on 10/20/2016, orthopedic and vascular surgery services were consulted.  Vascular surgery performed retrograde angiogram and stenting of left SFA on 2/11 and other procedures including toe amputation, left arm AV access and tunneled catheter on 2/14. Bilateral lower extremity DVTs were found on 2/1 and coumadin was started. Now she is on IV heparin bridge to coumadin and will be discharged once INR is therapeutic.   Assessment & Plan: Acute kidney injury on chronic kidney disease stage IV now progressed to ESRD: Had fluid overload/cardiorenal syndrome unimproved with diuretics, now improving on HD.  - Access: Right IJ tunneled HD catheter. Left brachiocephalic place 1/15 - Outpatient HD arranged at [facility] TTS 1st shift - Nephrology following, supervising inpatient HD.   Acute on chronic HFrEF, CAD, ischemic cardiomyopathy: EF of 35-40%. s/p cath with multi-vessel CAD with no target for PCI and no indication for CABG. No chest pain. Currently stable. - Continue coreg, statin, hydralazine, imdur. - Volume management with HD. - Cardiology has seen the patient.   Anemia of chronic kidney disease, ABLA:  - ESA and IV iron given. Received 1u PRBCs 2/12 and 2/14. - Monitor CBC  Left second toe osteomyelitis/peripheral vascular disease: s/p attempted left lower extremity revascularization that was unsuccessful and  she has a moderate length segment SFA occlusion on the left and a diminutive proximal SFA on 11/03/2017.   - s/p  retrograde angiogram and stenting of left SFA 2/11. Continuing plavix, stopped ASA - Subsequent toe amputation on 2/14, antibiotics discontinued per vascular surgery recommendations postoperatively.   Bilateral lower extremity DVTs: U/S 2/1 +DVT in right posterior tibial, peroneal veins; in left femoral and posterior tibial veins. - Continue IV heparin bridge to coumadin (started 2/15). Will need therapeutic INR prior to discharge.  Essential hypertension:  - Continue current medications, HD  Physical deconditioning: PT OT evaluation >recommend home health PT and OT at discharge.  DVT prophylaxis: Heparin, coumadin Code Status: Full Family Communication: None at bedside Disposition Plan: DC home with home health PT/OT once INR has been therapeutic x24hrs. Possibly 2/20.  Consultants:   Cardiology  Vascular surgery  Orthopedics  Nephrology  Procedures:  11/10/2017 Eda Paschal. Donzetta Matters, MD 1.  Right internal jugular tunneled dialysis catheter 2.  Left arm brachiocephalic av fistula creation 3.  Left 2nd toe amputation  11/07/2017 [name] C. Donzetta Matters, MD 1.  US guided cannulation of right common femoral artery 2.  US guided cannulation of left anterior tibial artery 3.  Left lower extremity angiogram 4.  Drug coated balloon angioplasty of left sfa with 5 x 232mm in.pact 5.  Stent of left sfa with 5x176mm and 5x157mm innova stents 6.  Moderate sedation with fentanyl and versed for 97 minutes 7.  Attempted percutaneous closure with mynx  11/03/2017 [name] C. Donzetta Matters, MD 1.  US guided cannulation of right common femoral artery 2.  Aortogram with left lower extremity angiogram 3.  Attempted treatment of left sfa occlusion 4.  Percutaneous closure of right common femoral arteriotomy with proglide device  Multiple hemodialysis sessions  Antimicrobials:  Ceftriaxone,  daptomycin completed 2/13.   Subjective: Feels better, breathing ok, no bleeding/bruising. Pain is controlled.  Objective: Vitals:   11/12/17 1000 11/12/17 1030 11/12/17 1100 11/12/17 1130  BP: (!) 74/37 (!) 88/52 (!) 66/30 (!) 71/24  Pulse: 61 (!) 54 61 60  Resp: 18 18 20 18   Temp:      TempSrc:      SpO2:      Weight:      Height:        Intake/Output Summary (Last 24 hours) at 11/12/2017 1212 Last data filed at 11/12/2017 1100 Gross per 24 hour  Intake 2219.55 ml  Output -  Net 2219.55 ml   Filed Weights   11/11/17 1139 11/12/17 0500 11/12/17 0930  Weight: 98.9 kg (218 lb 0.6 oz) 101.4 kg (223 lb 8.7 oz) 100.5 kg (221 lb 9 oz)    Gen: 62 y.o. female in no distress Pulm: Non-labored breathing. Clear to auscultation bilaterally.  CV: Regular rate and rhythm. No murmur, rub, or gallop. No JVD, No pedal edema. GI: Abdomen soft, non-tender, non-distended, with normoactive bowel sounds. No organomegaly or masses felt. Ext: Left proximal volar forearm with +thrill, fluctuance, soft, mildly tender. Reft radial pulse 2_, cap refill brisk. Left foot dressing c/d/i, SILT and intact motor function.  Skin: Right IJ TDC site c/d/i.  Neuro: Alert and oriented. No focal neurological deficits. Psych: Judgement and insight appear normal. Mood & affect appropriate.   Data Reviewed: I have personally reviewed following labs and imaging studies  CBC: Recent Labs  Lab 11/08/17 1344 11/09/17 0507 11/10/17 0423 11/11/17 0200 11/12/17 0428  WBC 7.1 8.5 8.0 7.8 8.3  HGB 8.0* 8.3* 7.9* 8.2* 7.7*  HCT 27.1* 28.0* 26.3* 27.6* 26.3*  MCV 94.1 93.3 93.3 93.9 94.3  PLT 396 371 325 342 836   Basic Metabolic Panel: Recent Labs  Lab 11/08/17 1344 11/10/17 0423 11/10/17 1602 11/11/17 0200 11/12/17 0930  NA 136 136 134* 134* 133*  K 3.8 3.8 4.6 4.5 3.2*  CL 99* 102 102 102 97*  CO2 25 22 22  20* 25  GLUCOSE 84 108* 249* 206* 138*  BUN 7 17 20  22* 18  CREATININE 1.75* 3.06* 3.17* 3.11*  2.40*  CALCIUM 8.0* 8.0* 8.5* 8.7* 8.0*  PHOS 1.8*  --  4.2  --  2.7   GFR: Estimated Creatinine Clearance: 30.1 mL/min (A) (by C-G formula based on SCr of 2.4 mg/dL (H)). Liver Function Tests: Recent Labs  Lab 11/08/17 1344 11/10/17 1602 11/12/17 0930  ALBUMIN 2.6* 2.8* 2.5*   No results for input(s): LIPASE, AMYLASE in the last 168 hours. No results for input(s): AMMONIA in the last 168 hours. Coagulation Profile: Recent Labs  Lab 11/11/17 1820 11/12/17 0428  INR 1.36 1.40   Cardiac Enzymes: Recent Labs  Lab 11/10/17 0423  CKTOTAL 9*   BNP (last 3 results) No results for input(s): PROBNP in the last 8760 hours. HbA1C: No results for input(s): HGBA1C in the last 72 hours. CBG: Recent Labs  Lab 11/10/17 1221  GLUCAP 105*   Lipid Profile: No results for input(s): CHOL, HDL, LDLCALC, TRIG, CHOLHDL, LDLDIRECT in the last 72 hours. Thyroid Function Tests: No results for input(s): TSH, T4TOTAL, FREET4, T3FREE, THYROIDAB in the last 72 hours. Anemia Panel: No results for input(s): VITAMINB12, FOLATE, FERRITIN, TIBC, IRON, RETICCTPCT in the last 72 hours. Urine analysis:    Component Value Date/Time  COLORURINE YELLOW 10/17/2017 1402   APPEARANCEUR HAZY (A) 10/17/2017 1402   LABSPEC 1.013 10/17/2017 1402   PHURINE 5.0 10/17/2017 1402   GLUCOSEU NEGATIVE 10/17/2017 1402   HGBUR NEGATIVE 10/17/2017 1402   BILIRUBINUR NEGATIVE 10/17/2017 1402   KETONESUR 5 (A) 10/17/2017 1402   PROTEINUR NEGATIVE 10/17/2017 1402   NITRITE NEGATIVE 10/17/2017 1402   LEUKOCYTESUR NEGATIVE 10/17/2017 1402   No results found for this or any previous visit (from the past 240 hour(s)).    Radiology Studies: Dg Chest Port 1 View  Result Date: 11/10/2017 CLINICAL DATA:  Placement of a dialysis catheter. EXAM: PORTABLE CHEST 1 VIEW COMPARISON:  Portable chest x-ray of October 29, 2017 FINDINGS: The dialysis catheter tip projects over the midportion of the SVC. There is no postprocedure  pneumothorax. There are fluffy interstitial and alveolar airspace opacities bilaterally. The heart is top-normal in size. The pulmonary vascularity is prominent centrally but more distinct than on the earlier study. There is no pleural effusion. There calcification in the wall of the aortic arch. IMPRESSION: No postprocedure complication following dialysis catheter placement. Bilateral interstitial and alveolar opacities consistent with pneumonia or pulmonary edema. Slight improvement since the earlier study. Thoracic aortic atherosclerosis. Electronically Signed   By: David  Martinique M.D.   On: 11/10/2017 12:40    Scheduled Meds: . amLODipine  5 mg Oral Daily  . atorvastatin  20 mg Oral q1800  . carvedilol  25 mg Oral BID WC  . clopidogrel  75 mg Oral Q breakfast  . darbepoetin (ARANESP) injection - DIALYSIS  150 mcg Intravenous Q Sat-HD  . feeding supplement (PRO-STAT SUGAR FREE 64)  30 mL Oral BID  . gabapentin  100 mg Oral Daily  . hydrALAZINE  50 mg Oral Q8H  . isosorbide mononitrate  120 mg Oral Daily  . multivitamin  1 tablet Oral QHS  . pantoprazole  40 mg Oral Daily  . polyethylene glycol  17 g Oral Daily  . sodium chloride flush  3 mL Intravenous Q12H  . sodium chloride flush  3 mL Intravenous Q12H  . Warfarin - Pharmacist Dosing Inpatient   Does not apply q1800   Continuous Infusions: . sodium chloride Stopped (11/10/17 1210)  . sodium chloride 250 mL (11/12/17 1100)  . sodium chloride    . sodium chloride 10 mL/hr at 11/12/17 1100  . sodium chloride    . sodium chloride    . heparin 2,150 Units/hr (11/12/17 1100)     LOS: 33 days   Time spent: 25 minutes.  Vance Gather, MD [facility] Pager [phone]  If 7PM-7AM, please contact night-coverage [email or web address] Password [email or web address] 11/12/2017, 12:12 PM

## 2017-11-12 NOTE — Progress Notes (Signed)
Physical Therapy Treatment Patient Details Name: Kristen Berry MRN: [id number] DOB: 09/15/1956 Today's Date: 11/12/2017    History of Present Illness Patient is a 62 year old female who presented to the hospital with shortness of breath. She was found to have an acute kidney injury which has since progressed to ESRD (has started dialysis). Pt's course complicated by L second toe osteomyelitis/peripheral vascular disease s/p attempted left lower extremity revascularization that was unsuccessful. She has since had an amputation of the L second toe on 2/14. PMH: PVD, HTN, CHF CKD, OA     PT Comments    Pt seen as a re-evaluation following her L second toe amputation on 2/14. Pt continues to require supervision for bed mobility and min guard for transfers with RW. Pt limited this session secondary to fatigue following dialysis this AM. Pt would continue to benefit from skilled physical therapy services at this time while admitted and after d/c to address the below listed limitations in order to improve overall safety and independence with functional mobility.    Follow Up Recommendations  Home health PT     Equipment Recommendations  Rolling walker with 5" wheels    Recommendations for Other Services       Precautions / Restrictions Precautions Precautions: Fall Restrictions Weight Bearing Restrictions: No    Mobility  Bed Mobility Overal bed mobility: Needs Assistance Bed Mobility: Supine to Sit;Sit to Supine     Supine to sit: Supervision Sit to supine: Supervision   General bed mobility comments: supervision for safety  Transfers Overall transfer level: Needs assistance Equipment used: Rolling walker (2 wheeled) Transfers: Sit to/from Stand Sit to Stand: Min guard         General transfer comment: increased time, min guard for safety  Ambulation/Gait             General Gait Details: pt able to take 3-4 lateral steps towards her R side with use of RW and min  guard    Stairs            Wheelchair Mobility    Modified Rankin (Stroke Patients Only)       Balance Overall balance assessment: Needs assistance Sitting-balance support: Feet supported;No upper extremity supported Sitting balance-Leahy Scale: Good Sitting balance - Comments: pt tolerated sitting EOB ~20 minutes while eating her lunch   Standing balance support: Bilateral upper extremity supported;During functional activity;No upper extremity supported Standing balance-Leahy Scale: Poor                              Cognition Arousal/Alertness: Awake/alert Behavior During Therapy: WFL for tasks assessed/performed Overall Cognitive Status: Within Functional Limits for tasks assessed                                        Exercises      General Comments        Pertinent Vitals/Pain Pain Assessment: Faces Faces Pain Scale: Hurts little more Pain Location: L foot Pain Descriptors / Indicators: Discomfort Pain Intervention(s): Monitored during session;Repositioned    Home Living                      Prior Function            PT Goals (current goals can now be found in the care plan section) Acute Rehab  PT Goals PT Goal Formulation: With patient Time For Goal Achievement: 11/21/17 Potential to Achieve Goals: Good Progress towards PT goals: Progressing toward goals    Frequency    Min 3X/week      PT Plan Current plan remains appropriate    Co-evaluation              AM-PAC PT "6 Clicks" Daily Activity  Outcome Measure  Difficulty turning over in bed (including adjusting bedclothes, sheets and blankets)?: None Difficulty moving from lying on back to sitting on the side of the bed? : None Difficulty sitting down on and standing up from a chair with arms (e.g., wheelchair, bedside commode, etc,.)?: Unable Help needed moving to and from a bed to chair (including a wheelchair)?: A Little Help needed  walking in hospital room?: A Little Help needed climbing 3-5 steps with a railing? : A Lot 6 Click Score: 17    End of Session Equipment Utilized During Treatment: Oxygen;Gait belt Activity Tolerance: Patient limited by fatigue Patient left: in bed;with call bell/phone within reach;Other (comment)(L LE elevated) Nurse Communication: Mobility status PT Visit Diagnosis: Difficulty in walking, not elsewhere classified (R26.2);Other abnormalities of gait and mobility (R26.89)     Time: 4695-0722 PT Time Calculation (min) (ACUTE ONLY): 38 min  Charges:  $Therapeutic Activity: 23-37 mins                    G Codes:       [place], [place], [place] Martin 11/12/2017, 5:07 PM

## 2017-11-12 NOTE — Plan of Care (Signed)
  Progressing Activity: Ability to tolerate increased activity will improve 11/12/2017 2235 - Progressing by Blair Promise, RN Education: Knowledge of disease and its progression will improve 11/12/2017 2235 - Progressing by Blair Promise, RN Fluid Volume: Compliance with measures to maintain balanced fluid volume will improve 11/12/2017 2235 - Progressing by Blair Promise, RN Health Behavior/Discharge Planning: Ability to manage health-related needs will improve 11/12/2017 2235 - Progressing by Blair Promise, RN Nutritional: Ability to make healthy dietary choices will improve 11/12/2017 2235 - Progressing by Blair Promise, RN Physical Regulation: Complications related to the disease process, condition or treatment will be avoided or minimized 11/12/2017 2235 - Progressing by Blair Promise, RN

## 2017-11-12 NOTE — Progress Notes (Signed)
[facility] for Heparin/warfarin Indication: DVT  Patient Measurements: Height: 5\' 8"  (172.7 cm) Weight: 223 lb 8.7 oz (101.4 kg) IBW/kg (Calculated) : 63.9 Heparin Dosing Weight: 95 kg   Assessment: 36 YOF presented on 10/10/17 with worsening renal function and found to have a DVT. Patient is s/p cannulation of femoral and tibial arteries and stenting 11/07/17. Left 2nd toe amputation, left arm AVF, and right tunneled catheter placed on 2/14.  Heparin resumed 2/14 post-procedure. This AM, the heparin was slightly supratherapeutic, so the rate will be slightly lowered. New start warfarin 2/15, INR now 1.4, will continue current dosing and trend INR. CBC stable, no bleeding noted.  Goal of Therapy:  Heparin level 0.3-0.7 units/ml  INR 2-3 Monitor platelets by anticoagulation protocol: Yes   Plan:  Decrease heparin to 2150 units/hr Warfarin 7.5mg  PO x 1 Daily heparin level/CBC/INR Monitor for s/sx bleeding   Patterson Hammersmith PharmD PGY1 Pharmacy Practice Resident 11/12/2017 7:32 AM Pager: [phone] Phone: [phone]

## 2017-11-12 NOTE — Progress Notes (Signed)
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 Edrick Oh, Nephrology) discharged from Hospitalist service at [facility] on 10/04/17 after 5-day hospital stay for acute osteomyelitis of second left toe with cellulitis  Transferred to [facility] due to worsening renal failure, temporary dialysis catheter was placed on 10/19/2016, the patient started dialysis on 10/20/2016,   Vascular surgery did  retrograde angiogram and stenting of left SFA on 2/11 and other procedures including toe amputation, left arm AV access and tunneled catheter planned for 2/14   Patient was seen on dialysis and is waiting discharge   Dialysis is planned today  TTS schedule   Objective:  Vital signs in last 24 hours:  Temp:  [97.5 F (36.4 C)-98.2 F (36.8 C)] 98.2 F (36.8 C) (02/16 0800) Pulse Rate:  [61-93] 63 (02/16 0800) Resp:  [12-18] 18 (02/16 0800) BP: (64-104)/(38-82) 98/82 (02/16 0800) SpO2:  [97 %-100 %] 99 % (02/16 0800) Weight:  [218 lb 0.6 oz (98.9 kg)-223 lb 8.7 oz (101.4 kg)] 223 lb 8.7 oz (101.4 kg) (02/16 0500)  Weight change: -10.1 oz (-0.287 kg) Filed Weights   11/11/17 0727 11/11/17 1139 11/12/17 0500  Weight: 223 lb 12.3 oz (101.5 kg) 218 lb 0.6 oz (98.9 kg) 223 lb 8.7 oz (101.4 kg)    Intake/Output: I/O last 3 completed shifts: In: 240 [P.O.:240] Out: 1974 [Other:1974]   Intake/Output this shift:  Total I/O In: 240 [P.O.:240] Out: -   General: Pleasant elderly female in NAD Heart: S1,S2 RRR SR 1st degree AVB on monitor. HR 70s Lungs: CTAB A/P Abdomen: Active BS Extremities: No LE edema. Ace wrap LLE with shadowing on drsg covering L 2nd toe.  Dialysis Access: New LUA AVF + bruit. New RIJ TDC Drsg CDI.      Basic Metabolic Panel: Recent Labs  Lab 11/08/17 0406 11/08/17 1344 11/10/17 0423 11/10/17 1602  11/11/17 0200  NA 134* 136 136 134* 134*  K 3.4* 3.8 3.8 4.6 4.5  CL 97* 99* 102 102 102  CO2 24 25 22 22  20*  GLUCOSE 98 84 108* 249* 206*  BUN 24* 7 17 20  22*  CREATININE 4.01* 1.75* 3.06* 3.17* 3.11*  CALCIUM 8.3* 8.0* 8.0* 8.5* 8.7*  PHOS  --  1.8*  --  4.2  --     Liver Function Tests: Recent Labs  Lab 11/08/17 1344 11/10/17 1602  ALBUMIN 2.6* 2.8*   No results for input(s): LIPASE, AMYLASE in the last 168 hours. No results for input(s): AMMONIA in the last 168 hours.  CBC: Recent Labs  Lab 11/08/17 1344 11/09/17 0507 11/10/17 0423 11/11/17 0200 11/12/17 0428  WBC 7.1 8.5 8.0 7.8 8.3  HGB 8.0* 8.3* 7.9* 8.2* 7.7*  HCT 27.1* 28.0* 26.3* 27.6* 26.3*  MCV 94.1 93.3 93.3 93.9 94.3  PLT 396 371 325 342 319    Cardiac Enzymes: Recent Labs  Lab 11/10/17 0423  CKTOTAL 9*    BNP: Invalid input(s): POCBNP  CBG: Recent Labs  Lab 11/10/17 1221  GLUCAP 105*    Microbiology: Results for orders placed or performed during the hospital encounter of 10/10/17  Culture, blood (routine x 2)     Status: None   Collection Time: 10/19/17  2:56 PM  Result Value Ref Range Status   Specimen Description BLOOD LEFT ANTECUBITAL  Final   Special Requests   Final  BOTTLES DRAWN AEROBIC AND ANAEROBIC Blood Culture results may not be optimal due to an inadequate volume of blood received in culture bottles   Culture NO GROWTH 5 DAYS  Final   Report Status 10/24/2017 FINAL  Final  Culture, blood (routine x 2)     Status: None   Collection Time: 10/19/17  3:00 PM  Result Value Ref Range Status   Specimen Description BLOOD LEFT HAND  Final   Special Requests   Final    BOTTLES DRAWN AEROBIC AND ANAEROBIC Blood Culture adequate volume   Culture NO GROWTH 5 DAYS  Final   Report Status 10/24/2017 FINAL  Final  Surgical PCR screen     Status: None   Collection Time: 11/01/17 12:38 AM  Result Value Ref Range Status   MRSA, PCR NEGATIVE NEGATIVE Final   Staphylococcus aureus  NEGATIVE NEGATIVE Final    Comment: (NOTE) The Xpert SA Assay (FDA approved for NASAL specimens in patients 40 years of age and older), is one component of a comprehensive surveillance program. It is not intended to diagnose infection nor to guide or monitor treatment. Performed at [facility], [place] [address], [place], [place] [address]     Coagulation Studies: Recent Labs    11/11/17 1820 11/12/17 0428  LABPROT 16.7* 17.1*  INR 1.36 1.40    Urinalysis: No results for input(s): COLORURINE, LABSPEC, PHURINE, GLUCOSEU, HGBUR, BILIRUBINUR, KETONESUR, PROTEINUR, UROBILINOGEN, NITRITE, LEUKOCYTESUR in the last 72 hours.  Invalid input(s): APPERANCEUR    Imaging: Dg Chest Port 1 View  Result Date: 11/10/2017 CLINICAL DATA:  Placement of a dialysis catheter. EXAM: PORTABLE CHEST 1 VIEW COMPARISON:  Portable chest x-ray of October 29, 2017 FINDINGS: The dialysis catheter tip projects over the midportion of the SVC. There is no postprocedure pneumothorax. There are fluffy interstitial and alveolar airspace opacities bilaterally. The heart is top-normal in size. The pulmonary vascularity is prominent centrally but more distinct than on the earlier study. There is no pleural effusion. There calcification in the wall of the aortic arch. IMPRESSION: No postprocedure complication following dialysis catheter placement. Bilateral interstitial and alveolar opacities consistent with pneumonia or pulmonary edema. Slight improvement since the earlier study. Thoracic aortic atherosclerosis. Electronically Signed   By: David  Martinique M.D.   On: 11/10/2017 12:40   Dg Fluoro Guide Cv Line-no Report  Result Date: 11/10/2017 Fluoroscopy was utilized by the requesting physician.  No radiographic interpretation.     Medications:   . sodium chloride Stopped (11/10/17 1210)  . sodium chloride 250 mL (11/09/17 0510)  . sodium chloride    . sodium chloride 10 mL/hr at 11/10/17 0908  . sodium  chloride    . sodium chloride    . heparin 2,150 Units/hr (11/12/17 0803)   . amLODipine  5 mg Oral Daily  . atorvastatin  20 mg Oral q1800  . carvedilol  25 mg Oral BID WC  . clopidogrel  75 mg Oral Q breakfast  . darbepoetin (ARANESP) injection - DIALYSIS  150 mcg Intravenous Q Fri-HD  . [START ON 11/17/2017] darbepoetin (ARANESP) injection - DIALYSIS  150 mcg Intravenous Q Thu-HD  . feeding supplement (PRO-STAT SUGAR FREE 64)  30 mL Oral BID  . gabapentin  100 mg Oral Daily  . hydrALAZINE  50 mg Oral Q8H  . isosorbide mononitrate  120 mg Oral Daily  . multivitamin  1 tablet Oral QHS  . pantoprazole  40 mg Oral Daily  . polyethylene glycol  17 g Oral Daily  .  sodium chloride flush  3 mL Intravenous Q12H  . sodium chloride flush  3 mL Intravenous Q12H  . Warfarin - Pharmacist Dosing Inpatient   Does not apply q1800   sodium chloride, sodium chloride, sodium chloride, sodium chloride, acetaminophen, alteplase, camphor-menthol **AND** hydrOXYzine, docusate sodium, feeding supplement (NEPRO CARB STEADY), heparin, hydrALAZINE, hydrALAZINE, ipratropium-albuterol, labetalol, lidocaine (PF), lidocaine-prilocaine, nitroGLYCERIN, ondansetron (ZOFRAN) IV, oxyCODONE, pentafluoroprop-tetrafluoroeth, sodium chloride flush, sodium chloride flush, sodium chloride flush, sorbitol, zolpidem  Assessment/ Plan:   1. ESRD- progressive CKD complicated by cardiorenal syndrome. Has been responding well to IHD  Has TTS schedule first shift at [facility] once stable for discharge. 2. Acute on chronic CHF- improved with HD and UF.  3. CAD- diffuse disease with no PCI targets 4. Anemia of ESRD- HGB down 7.9.     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 PVD- left second toeosteomyelitis/PVD. Had 2ndtoe amp today per Dr. Donzetta Matters.   7. HTN/volume- stable. Nadir wt 95 kg. Wt today 98.6 kg Attempt UFG 2-2.5 liters tomorrow.  8.Vascular access-Went for placement of TDC and L brachiocephalic        LOS: 60 Rakisha Pincock W @TODAY @10 :51 AM

## 2017-11-13 LAB — CBC
HCT: 28.3 % — ABNORMAL LOW (ref 36.0–46.0)
Hemoglobin: 8.4 g/dL — ABNORMAL LOW (ref 12.0–15.0)
MCH: 28.2 pg (ref 26.0–34.0)
MCHC: 29.7 g/dL — ABNORMAL LOW (ref 30.0–36.0)
MCV: 95 fL (ref 78.0–100.0)
Platelets: 292 10*3/uL (ref 150–400)
RBC: 2.98 MIL/uL — ABNORMAL LOW (ref 3.87–5.11)
RDW: 20.7 % — ABNORMAL HIGH (ref 11.5–15.5)
WBC: 5.6 10*3/uL (ref 4.0–10.5)

## 2017-11-13 LAB — HEPARIN LEVEL (UNFRACTIONATED): Heparin Unfractionated: 0.58 IU/mL (ref 0.30–0.70)

## 2017-11-13 LAB — PROTIME-INR
INR: 2.73
Prothrombin Time: 28.7 seconds — ABNORMAL HIGH (ref 11.4–15.2)

## 2017-11-13 NOTE — Progress Notes (Signed)
Kristen Berry for Heparin/warfarin Indication: DVT  Patient Measurements: Height: 5\' 8"  (172.7 cm) Weight: 218 lb 4.1 oz (99 kg) IBW/kg (Calculated) : 63.9 Heparin Dosing Weight: 95 kg   Assessment: 37 YOF presented on 10/10/17 with worsening renal function and found to have a DVT. Patient is s/p cannulation of femoral and tibial arteries and stenting 11/07/17. Left 2nd toe amputation, left arm AVF, and right tunneled catheter placed on 2/14.  Heparin resumed 2/14 post-procedure. Heparin level today is therapeutic at 0.58, on 2150 units/hr. New start warfarin 2/15, INR changed from 1.36>1.4>2.73 - just seeing warfarin effects starting today. Hgb is stable at 8.4, platelets are WNL. No signs/symptoms of bleeding.  Given new DVT and only received 2 doses of warfarin so far, not truly fully anticoagulated with warfarin - typically recommend 5 days of warfarin with two day overlap of therapeutic INR. For this reason despite INR of 2.73, will continue heparin.   Goal of Therapy:  Heparin level 0.3-0.7 units/ml  INR 2-3 Monitor platelets by anticoagulation protocol: Yes   Plan:  Continue heparin at 2150 units/hr Hold warfarin Daily heparin level/CBC/INR Monitor for s/sx bleeding  Doylene Canard, PharmD Clinical Pharmacist  Pager: [phone] Clinical Phone for 11/13/2017 until 3:30pm: [phone] If after 3:30pm, please call main pharmacy at [phone]

## 2017-11-13 NOTE — Plan of Care (Signed)
Reviewed

## 2017-11-13 NOTE — Progress Notes (Signed)
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 Creatinine rose to 4.7 from baseline of 3 and patient was admitted at [facility] 10/10/2017. Temporary dialysis catheter was placed on 10/19/2016, HD started on 10/20/2016, orthopedic and vascular surgery services were consulted.  Vascular surgery performed retrograde angiogram and stenting of left SFA on 2/11 and other procedures including toe amputation, left arm AV access and tunneled catheter on 2/14. Bilateral lower extremity DVTs were found on 2/1 and coumadin was started. Now she is on IV heparin bridge to coumadin and will be discharged once INR is therapeutic.   Assessment & Plan: Acute kidney injury on chronic kidney disease stage IV now progressed to ESRD: Had fluid overload/cardiorenal syndrome unimproved with diuretics, now improving on HD.  - Access: Right IJ tunneled HD catheter. Left brachiocephalic AVF placed 2/70 - Outpatient HD arranged at [facility] 1st shift - Nephrology following, supervising inpatient HD.   Acute on chronic HFrEF, CAD, ischemic cardiomyopathy: EF of 35-40%. s/p cath with multi-vessel CAD with no target for PCI and no indication for CABG. No chest pain. Currently stable. - Continue coreg, statin, hydralazine, imdur. - Volume management with HD. - Cardiology has seen the patient.   Anemia of chronic kidney disease, ABLA:  - ESA and IV iron given. Received 1u PRBCs 2/12 and 2/14. - Monitor CBC, appears stable.  Left second toe osteomyelitis/peripheral vascular disease: s/p attempted left lower extremity revascularization  that was unsuccessful and she has a moderate length segment SFA occlusion on the left and a diminutive proximal SFA on 11/03/2017.   - s/p retrograde angiogram and stenting of left SFA 2/11. Continuing plavix, stopped ASA - Subsequent toe amputation on 2/14, antibiotics discontinued per vascular surgery recommendations postoperatively. Will need to pull PICC prior to discharge.  Bilateral lower extremity DVTs: U/S 2/1 +DVT in right posterior tibial, peroneal veins; in left femoral and posterior tibial veins. - Continue IV heparin bridge to coumadin (started 2/15). Will need therapeutic INR prior to discharge.  Essential hypertension:  - Continue current medications, HD  Physical deconditioning: PT OT evaluation >recommend home health PT and OT at discharge.  DVT prophylaxis: Heparin, coumadin Code Status: Full Family Communication: None at bedside Disposition Plan: DC home with home health PT/OT once INR has been therapeutic x24hrs.  Consultants:   Cardiology  Vascular surgery  Orthopedics  Nephrology  Procedures:  11/10/2017 Eda Paschal. Donzetta Matters, MD 1.  Right internal jugular tunneled dialysis catheter 2.  Left arm brachiocephalic av fistula creation 3.  Left 2nd toe amputation  11/07/2017 [name] C. Donzetta Matters, MD 1.  US guided cannulation of right common femoral artery 2.  US guided cannulation of left anterior tibial artery 3.  Left lower extremity angiogram 4.  Drug coated balloon angioplasty of left sfa with 5 x 28mm in.pact 5.  Stent of left sfa with 5x110mm and 5x141mm innova stents 6.  Moderate sedation with fentanyl and versed for 97 minutes 7.  Attempted percutaneous closure with mynx  11/03/2017 [name] C. Donzetta Matters, MD 1.  US guided cannulation of right common femoral artery 2.  Aortogram with left lower extremity angiogram 3.  Attempted treatment of left sfa occlusion 4.  Percutaneous closure of right common femoral arteriotomy with proglide device  Multiple hemodialysis  sessions  Antimicrobials:  Ceftriaxone, daptomycin completed 2/13.   Subjective: Did not sleep well but not for any specific reason. Denies bleeding, bruising. Having regular BMs, eating, working with PT. Feels she may be challenged by going home prior to rehabilitation, though PT recommended going home at discharge.   Objective: Vitals:   11/12/17 1500 11/12/17 2109 11/12/17 2122 11/13/17 0600  BP: (!) 96/52 115/62 115/62 124/68  Pulse: 71 72  75  Resp: 10 (!) 24  16  Temp: 98.2 F (36.8 C) 97.8 F (36.6 C)  98.2 F (36.8 C)  TempSrc: Oral Oral  Oral  SpO2: 100% 97%  100%  Weight:    99 kg (218 lb 4.1 oz)  Height:        Intake/Output Summary (Last 24 hours) at 11/13/2017 0855 Last data filed at 11/12/2017 1836 Gross per 24 hour  Intake 2219.55 ml  Output 2000 ml  Net 219.55 ml   Filed Weights   11/12/17 0930 11/12/17 1300 11/13/17 0600  Weight: 100.5 kg (221 lb 9 oz) 98.5 kg (217 lb 2.5 oz) 99 kg (218 lb 4.1 oz)    Gen: 62 y.o. female in no distress Pulm: Non-labored breathing. Clear to auscultation bilaterally.  CV: Regular rate and rhythm. No murmur, rub, or gallop. No JVD, No pedal edema. GI: Abdomen soft, non-tender, non-distended, with normoactive bowel sounds. No organomegaly or masses felt. Ext: Left proximal volar forearm with +thrill, fluctuance, soft, mildly tender. Right single lumen PICC in place, nontender, no erythema. Right and left radial pulse 2+, cap refill brisk. Left foot dressing c/d/i, SILT and intact motor function.  Skin: Right IJ TDC site c/d/i.  Neuro: Alert and oriented. No focal neurological deficits. Psych: Judgement and insight appear normal. Mood & affect appropriate.   CBC: Recent Labs  Lab 11/09/17 0507 11/10/17 0423 11/11/17 0200 11/12/17 0428 11/13/17 0314  WBC 8.5 8.0 7.8 8.3 5.6  HGB 8.3* 7.9* 8.2* 7.7* 8.4*  HCT 28.0* 26.3* 27.6* 26.3* 28.3*  MCV 93.3 93.3 93.9 94.3 95.0  PLT 371 325 342 319 503   Basic Metabolic  Panel: Recent Labs  Lab 11/08/17 1344 11/10/17 0423 11/10/17 1602 11/11/17 0200 11/11/17 0740 11/12/17 0930  NA 136 136 134* 134*  --  133*  K 3.8 3.8 4.6 4.5  --  3.2*  CL 99* 102 102 102  --  97*  CO2 25 22 22  20*  --  25  GLUCOSE 84 108* 249* 206*  --  138*  BUN 7 17 20  22*  --  18  CREATININE 1.75* 3.06* 3.17* 3.11*  --  2.40*  CALCIUM 8.0* 8.0* 8.5* 8.7* 8.3* 8.0*  PHOS 1.8*  --  4.2  --   --  2.7   GFR: Estimated Creatinine Clearance: 29.9 mL/min (A) (by C-G formula based on SCr of 2.4 mg/dL (H)).  Coagulation Profile: Recent Labs  Lab 11/11/17 1820 11/12/17 0428 11/13/17 0314  INR 1.36 1.40 2.73   Scheduled Meds: . amLODipine  5 mg Oral Daily  . atorvastatin  20 mg Oral q1800  . carvedilol  25 mg Oral BID WC  . clopidogrel  75 mg Oral Q breakfast  . darbepoetin (ARANESP) injection - DIALYSIS  150 mcg Intravenous Q Sat-HD  . feeding supplement (PRO-STAT SUGAR FREE 64)  30 mL Oral BID  . gabapentin  100 mg Oral Daily  .  hydrALAZINE  50 mg Oral Q8H  . isosorbide mononitrate  120 mg Oral Daily  . multivitamin  1 tablet Oral QHS  . pantoprazole  40 mg Oral Daily  . polyethylene glycol  17 g Oral Daily  . sodium chloride flush  3 mL Intravenous Q12H  . sodium chloride flush  3 mL Intravenous Q12H  . Warfarin - Pharmacist Dosing Inpatient   Does not apply q1800   Continuous Infusions: . sodium chloride Stopped (11/10/17 1210)  . sodium chloride 250 mL (11/12/17 1100)  . sodium chloride    . sodium chloride 10 mL/hr at 11/12/17 1100  . sodium chloride    . sodium chloride    . heparin 2,150 Units/hr (11/12/17 1521)     LOS: 34 days   Time spent: 25 minutes.  Vance Gather, MD [facility] Pager [phone]  If 7PM-7AM, please contact night-coverage [email or web address] Password [facility] 11/13/2017, 8:55 AM

## 2017-11-13 NOTE — Progress Notes (Signed)
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 Edrick Oh, Nephrology) discharged from Hospitalist service at [facility] on 10/04/17 after 5-day hospital stay for acute osteomyelitis of second left toe with cellulitis  Transferred to [facility] due to worsening renal failure, temporary dialysis catheter was placed on 10/19/2016, the patient started dialysis on 10/20/2016,   Vascular surgery did  retrograde angiogram and stenting of left SFA on 2/11 and other procedures including toe amputation, left arm AV access and tunneled catheter planned for 2/14   Patient was seen on dialysis and is waiting discharge once INR therapeutic  Dialysis is planned  TTS schedule  Bilateral lower extremity DVTs: U/S 2/1 +DVT in right posterior tibial, peroneal veins; in left femoral and posterior tibial veins. - Continue IV heparin bridge to coumadin (started 2/15). Will need therapeutic INR prior to discharge.      Objective:  Vital signs in last 24 hours:  Temp:  [97.5 F (36.4 C)-98.2 F (36.8 C)] 98.2 F (36.8 C) (02/17 0600) Pulse Rate:  [57-77] 77 (02/17 0947) Resp:  [10-24] 18 (02/17 0947) BP: (48-146)/(27-74) 118/70 (02/17 0947) SpO2:  [97 %-100 %] 100 % (02/17 0947) Weight:  [217 lb 2.5 oz (98.5 kg)-218 lb 4.1 oz (99 kg)] 218 lb 4.1 oz (99 kg) (02/17 0600)  Weight change: -3.3 oz (-1 kg) Filed Weights   11/12/17 0930 11/12/17 1300 11/13/17 0600  Weight: 221 lb 9 oz (100.5 kg) 217 lb 2.5 oz (98.5 kg) 218 lb 4.1 oz (99 kg)    Intake/Output: I/O last 3 completed shifts: In: 2219.6 [P.O.:480; I.V.:1739.6] Out: 2000 [Other:2000]   Intake/Output this shift:  Total I/O In: 200 [P.O.:200] Out: 1 [Stool:1]  General: Pleasant elderly female in NAD Heart: S1,S2 RRR SR 1st degree AVB on monitor. HR 70s Lungs: CTAB A/P Abdomen: Active  BS Extremities: No LE edema. Ace wrap LLE with shadowing on drsg covering L 2nd toe.  Dialysis Access: New LUA AVF + bruit. New RIJ TDC Drsg CDI.      Basic Metabolic Panel: Recent Labs  Lab 11/08/17 1344 11/10/17 0423 11/10/17 1602 11/11/17 0200 11/11/17 0740 11/12/17 0930  NA 136 136 134* 134*  --  133*  K 3.8 3.8 4.6 4.5  --  3.2*  CL 99* 102 102 102  --  97*  CO2 25 22 22  20*  --  25  GLUCOSE 84 108* 249* 206*  --  138*  BUN 7 17 20  22*  --  18  CREATININE 1.75* 3.06* 3.17* 3.11*  --  2.40*  CALCIUM 8.0* 8.0* 8.5* 8.7* 8.3* 8.0*  PHOS 1.8*  --  4.2  --   --  2.7    Liver Function Tests: Recent Labs  Lab 11/08/17 1344 11/10/17 1602 11/12/17 0930  ALBUMIN 2.6* 2.8* 2.5*   No results for input(s): LIPASE, AMYLASE in the last 168 hours. No results for input(s): AMMONIA in the last 168 hours.  CBC: Recent Labs  Lab 11/09/17 0507 11/10/17 0423 11/11/17 0200 11/12/17 0428 11/13/17 0314  WBC 8.5 8.0 7.8 8.3 5.6  HGB 8.3* 7.9* 8.2* 7.7* 8.4*  HCT 28.0* 26.3* 27.6* 26.3* 28.3*  MCV 93.3 93.3 93.9 94.3 95.0  PLT 371 325 342 319 292    Cardiac Enzymes: Recent Labs  Lab 11/10/17 0423  CKTOTAL 9*    BNP: Invalid input(s): POCBNP  CBG: Recent Labs  Lab 11/10/17 1221  GLUCAP 105*    Microbiology: Results for orders placed or performed during the hospital encounter of 10/10/17  Culture, blood (routine x 2)     Status: None   Collection Time: 10/19/17  2:56 PM  Result Value Ref Range Status   Specimen Description BLOOD LEFT ANTECUBITAL  Final   Special Requests   Final    BOTTLES DRAWN AEROBIC AND ANAEROBIC Blood Culture results may not be optimal due to an inadequate volume of blood received in culture bottles   Culture NO GROWTH 5 DAYS  Final   Report Status 10/24/2017 FINAL  Final  Culture, blood (routine x 2)     Status: None   Collection Time: 10/19/17  3:00 PM  Result Value Ref Range Status   Specimen Description BLOOD LEFT HAND  Final    Special Requests   Final    BOTTLES DRAWN AEROBIC AND ANAEROBIC Blood Culture adequate volume   Culture NO GROWTH 5 DAYS  Final   Report Status 10/24/2017 FINAL  Final  Surgical PCR screen     Status: None   Collection Time: 11/01/17 12:38 AM  Result Value Ref Range Status   MRSA, PCR NEGATIVE NEGATIVE Final   Staphylococcus aureus NEGATIVE NEGATIVE Final    Comment: (NOTE) The Xpert SA Assay (FDA approved for NASAL specimens in patients 73 years of age and older), is one component of a comprehensive surveillance program. It is not intended to diagnose infection nor to guide or monitor treatment. Performed at [facility], [place] [address], [place], [place] [address]     Coagulation Studies: Recent Labs    11/11/17 1820 11/12/17 0428 11/13/17 0314  LABPROT 16.7* 17.1* 28.7*  INR 1.36 1.40 2.73    Urinalysis: No results for input(s): COLORURINE, LABSPEC, PHURINE, GLUCOSEU, HGBUR, BILIRUBINUR, KETONESUR, PROTEINUR, UROBILINOGEN, NITRITE, LEUKOCYTESUR in the last 72 hours.  Invalid input(s): APPERANCEUR    Imaging: No results found.   Medications:   . sodium chloride Stopped (11/10/17 1210)  . sodium chloride 250 mL (11/12/17 1100)  . sodium chloride    . sodium chloride 10 mL/hr at 11/12/17 1100  . sodium chloride    . sodium chloride    . heparin 2,150 Units/hr (11/12/17 1521)   . amLODipine  5 mg Oral Daily  . atorvastatin  20 mg Oral q1800  . carvedilol  25 mg Oral BID WC  . clopidogrel  75 mg Oral Q breakfast  . darbepoetin (ARANESP) injection - DIALYSIS  150 mcg Intravenous Q Sat-HD  . feeding supplement (PRO-STAT SUGAR FREE 64)  30 mL Oral BID  . gabapentin  100 mg Oral Daily  . hydrALAZINE  50 mg Oral Q8H  . isosorbide mononitrate  120 mg Oral Daily  . multivitamin  1 tablet Oral QHS  . pantoprazole  40 mg Oral Daily  . polyethylene glycol  17 g Oral Daily  . sodium chloride flush  3 mL Intravenous Q12H  . sodium chloride flush  3 mL Intravenous  Q12H  . Warfarin - Pharmacist Dosing Inpatient   Does not apply q1800   sodium chloride, sodium chloride, sodium chloride, sodium chloride, acetaminophen, alteplase, camphor-menthol **AND** hydrOXYzine, docusate sodium, feeding supplement (NEPRO CARB STEADY), heparin, hydrALAZINE, hydrALAZINE, ipratropium-albuterol, labetalol, lidocaine (PF), lidocaine-prilocaine, nitroGLYCERIN, ondansetron (ZOFRAN) IV, oxyCODONE, pentafluoroprop-tetrafluoroeth, sodium chloride flush, sodium chloride flush, sodium chloride flush, sorbitol, zolpidem  Assessment/ Plan:   1. ESRD- progressive CKD complicated by cardiorenal syndrome. Has been responding well  to IHD  Has TTS schedule first shift at [facility] once stable for discharge. 2. Acute on chronic CHF- improved with HD and UF.  3. CAD- diffuse disease with no PCI targets 4. Anemia of ESRD- HGB down 7.9.     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 PVD- left second toeosteomyelitis/PVD. Had 2ndtoe amp today per Dr. Donzetta Matters.   7. HTN/volume- stable. Nadir wt 95 kg. Wt today 98.6 kg Attempt UFG 2-2.5 liters tomorrow.  8.Vascular access-Went for placement of TDC and L brachiocephalic 9.    DVT awaiting therapeutic INR     LOS: 34 Kristen Berry W @TODAY @11 :40 AM

## 2017-11-14 ENCOUNTER — Telehealth: Payer: Self-pay | Admitting: Vascular Surgery

## 2017-11-14 LAB — CBC
HCT: 27.6 % — ABNORMAL LOW (ref 36.0–46.0)
Hemoglobin: 8.1 g/dL — ABNORMAL LOW (ref 12.0–15.0)
MCH: 27.7 pg (ref 26.0–34.0)
MCHC: 29.3 g/dL — ABNORMAL LOW (ref 30.0–36.0)
MCV: 94.5 fL (ref 78.0–100.0)
Platelets: 271 10*3/uL (ref 150–400)
RBC: 2.92 MIL/uL — ABNORMAL LOW (ref 3.87–5.11)
RDW: 20.7 % — ABNORMAL HIGH (ref 11.5–15.5)
WBC: 6.7 10*3/uL (ref 4.0–10.5)

## 2017-11-14 LAB — PTH, INTACT AND CALCIUM
Calcium, Total (PTH): 8.3 mg/dL — ABNORMAL LOW (ref 8.7–10.3)
PTH: 182 pg/mL — ABNORMAL HIGH (ref 15–65)

## 2017-11-14 LAB — PROTIME-INR
INR: 2.62
Prothrombin Time: 27.8 seconds — ABNORMAL HIGH (ref 11.4–15.2)

## 2017-11-14 LAB — HEPARIN LEVEL (UNFRACTIONATED): Heparin Unfractionated: 0.38 IU/mL (ref 0.30–0.70)

## 2017-11-14 MED ORDER — WARFARIN SODIUM 5 MG PO TABS
5.0000 mg | ORAL_TABLET | Freq: Once | ORAL | Status: AC
  Administered 2017-11-14: 5 mg via ORAL
  Filled 2017-11-14: qty 1

## 2017-11-14 NOTE — Progress Notes (Signed)
Physical Therapy Treatment Patient Details Name: Kristen Berry MRN: [id number] DOB: August 31, 1956 Today's Date: 11/14/2017    History of Present Illness Patient is a 62 year old female who presented to the hospital with shortness of breath. She was found to have an acute kidney injury which has since progressed to ESRD (has started dialysis). Pt's course complicated by L second toe osteomyelitis/peripheral vascular disease s/p attempted left lower extremity revascularization that was unsuccessful. She has since had an amputation of the L second toe on 2/14. PMH: PVD, HTN, CHF CKD, OA     PT Comments    Pt performed gait during session with L post op shoe and without supplemental O2.  Difficult to obtain an accurate waveform during gait training but pre and post gait training 99%-100% on RA.  Pt left in room on RA post session.  Pt tolerated increased gait and performed seated exercises.     Follow Up Recommendations  Home health PT     Equipment Recommendations  Rolling walker with 5" wheels    Recommendations for Other Services       Precautions / Restrictions Precautions Precautions: Fall Restrictions Weight Bearing Restrictions: Yes Other Position/Activity Restrictions: L WBAT in post op shoe    Mobility  Bed Mobility Overal bed mobility: Needs Assistance Bed Mobility: Sit to Supine     Supine to sit: Modified independent (Device/Increase time) Sit to supine: Supervision   General bed mobility comments: supervision for safety  Transfers Overall transfer level: Needs assistance Equipment used: Rolling walker (2 wheeled) Transfers: Sit to/from Stand Sit to Stand: Min assist         General transfer comment: Pt required min assistance to boost into standing after several unsuccessful attempts.    Ambulation/Gait Ambulation/Gait assistance: Min guard Ambulation Distance (Feet): 120 Feet Assistive device: Rolling walker (2 wheeled) Gait Pattern/deviations:  Step-through pattern;Decreased stride length;Trunk flexed;Shuffle Gait velocity: Decreased Gait velocity interpretation: Below normal speed for age/gender General Gait Details: Pt required cues for upper trunk control and leading with her LLE during gait training.  Unable to obtain accurate wave form for SPO2, pre session 100% on RA, poor wavform during gait and 99% post gait training.     Stairs            Wheelchair Mobility    Modified Rankin (Stroke Patients Only)       Balance Overall balance assessment: Needs assistance Sitting-balance support: No upper extremity supported;Feet supported Sitting balance-Leahy Scale: Normal     Standing balance support: No upper extremity supported;During functional activity Standing balance-Leahy Scale: Fair Standing balance comment: standing at sink to comb hair                            Cognition Arousal/Alertness: Awake/alert Behavior During Therapy: WFL for tasks assessed/performed Overall Cognitive Status: Within Functional Limits for tasks assessed                                        Exercises General Exercises - Lower Extremity Long Arc Quad: AROM;Both;10 reps;Seated Hip ABduction/ADduction: AROM;Both;10 reps;Seated(performed via pillow squeeze) Hip Flexion/Marching: AROM;Both;10 reps;Seated    General Comments        Pertinent Vitals/Pain Pain Assessment: 0-10 Pain Score: 3  Pain Location: L foot Pain Descriptors / Indicators: Discomfort Pain Intervention(s): Monitored during session;Repositioned    Home Living  Prior Function            PT Goals (current goals can now be found in the care plan section) Acute Rehab PT Goals Patient Stated Goal: Feel stronger  Potential to Achieve Goals: Good Progress towards PT goals: Progressing toward goals    Frequency    Min 3X/week      PT Plan Current plan remains appropriate    Co-evaluation               AM-PAC PT "6 Clicks" Daily Activity  Outcome Measure  Difficulty turning over in bed (including adjusting bedclothes, sheets and blankets)?: None Difficulty moving from lying on back to sitting on the side of the bed? : None Difficulty sitting down on and standing up from a chair with arms (e.g., wheelchair, bedside commode, etc,.)?: Unable Help needed moving to and from a bed to chair (including a wheelchair)?: A Little Help needed walking in hospital room?: A Little Help needed climbing 3-5 steps with a railing? : A Little 6 Click Score: 18    End of Session Equipment Utilized During Treatment: Gait belt Activity Tolerance: Patient limited by fatigue Patient left: in bed;with call bell/phone within reach;Other (comment) Nurse Communication: Mobility status PT Visit Diagnosis: Difficulty in walking, not elsewhere classified (R26.2);Other abnormalities of gait and mobility (R26.89)     Time: 5189-8421 PT Time Calculation (min) (ACUTE ONLY): 25 min  Charges:  $Gait Training: 8-22 mins $Therapeutic Exercise: 8-22 mins                    G Codes:       [name], PTA pager [place] 11/14/2017, 5:31 PM

## 2017-11-14 NOTE — Progress Notes (Addendum)
[place] for Heparin/warfarin Indication: DVT  Patient Measurements: Height: 5\' 8"  (172.7 cm) Weight: 218 lb 11.2 oz (99.2 kg) IBW/kg (Calculated) : 63.9 Heparin Dosing Weight: 95 kg   Assessment: 33 YOF presented on 10/10/17 with worsening renal function and found to have a DVT. Patient is s/p cannulation of femoral and tibial arteries and stenting 11/07/17. Left 2nd toe amputation, left arm AVF, and right tunneled catheter placed on 2/14. She is also noted on plavix for PAD (s/p DCBA and stents on 2/11) -Heparin resumed 2/14 post-procedure and at goal on 2150 units/hr.  -INR= 2.62 (2 doses of coumadin since 2/15)  Goal of Therapy:  Heparin level 0.3-0.7 units/ml  INR 2-3 Monitor platelets by anticoagulation protocol: Yes   Plan:  -Continue heparin at 2150 units/hr (would continue to complete a minimum 5 days; currently day 4) -Warfarin 5mg  po today -Daily heparin level/CBC/INR  Hildred Laser, Pharm D 11/14/2017 8:16 AM  Clinical Phone for 11/14/2017 until 3:30pm: [phone] If after 3:30pm, please call main pharmacy at [phone]

## 2017-11-14 NOTE — Progress Notes (Signed)
PT Cancellation Note  Patient Details Name: Kristen Berry MRN: [id number] DOB: 05-28-1956   Cancelled Treatment:    Reason Eval/Treat Not Completed: (P) Other (comment)(waiting on clarification of weight bearing orders post surgery)   [name] 11/14/2017, 12:27 PM  [name], PTA pager [phone]

## 2017-11-14 NOTE — Progress Notes (Signed)
Orthopedic Tech Progress Note Patient Details:  Georgann Bramble [facility] 10-22-1955 [id number]  Ortho Devices Type of Ortho Device: Ace wrap, Postop shoe/boot Ortho Device/Splint Interventions: Application   Post Interventions Patient Tolerated: Well Instructions Provided: Care of device   [place] Pink 11/14/2017, 2:04 PM

## 2017-11-14 NOTE — Telephone Encounter (Signed)
-----   Message from Mena Goes, RN sent at 11/11/2017  3:23 PM EST ----- Regarding: 2 weeks post toe amp and AVF    ----- Message ----- From: Iline Oven Sent: 11/11/2017   2:54 PM To: Vvs Charge Pool  Can you schedule an appt in about 2 weeks with Dr. Donzetta Matters.  PO L brachiocephalic and L 2nd toe amp. Thanks, [facility]

## 2017-11-14 NOTE — Progress Notes (Signed)
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 Creatinine rose to 4.7 from baseline of 3 and patient was admitted at [facility] 10/10/2017. Temporary dialysis catheter was placed on 10/19/2016, HD started on 10/20/2016, orthopedic and vascular surgery services were consulted.  Vascular surgery performed retrograde angiogram and stenting of left SFA on 2/11 and other procedures including toe amputation, left arm AV access and tunneled catheter on 2/14. Bilateral lower extremity DVTs were found on 2/1 and coumadin was started. Now she is on IV heparin bridge to coumadin and will be discharged once INR is therapeutic, 5 days overlap.   Assessment & Plan: Acute kidney injury on chronic kidney disease stage IV now progressed to ESRD: Had fluid overload/cardiorenal syndrome unimproved with diuretics, now improving on HD.  - Access: Right IJ tunneled HD catheter. Left brachiocephalic AVF placed 9/37 - Outpatient HD arranged at [facility] 1st shift - Nephrology following, supervising inpatient HD.   Acute on chronic HFrEF, CAD, ischemic cardiomyopathy: EF of 35-40%. s/p cath with multi-vessel CAD with no target for PCI and no indication for CABG. No chest pain. Currently stable. - Continue coreg, statin, hydralazine, imdur. - Volume management with HD. - Cardiology has seen the patient.   Anemia of chronic kidney disease, ABLA:  - ESA and IV iron given. Received 1u PRBCs 2/12 and 2/14. - Monitor CBC, appears stable >8.  Left second toe osteomyelitis/peripheral vascular disease: s/p attempted left lower extremity  revascularization that was unsuccessful and she has a moderate length segment SFA occlusion on the left and a diminutive proximal SFA on 11/03/2017.   - s/p retrograde angiogram and stenting of left SFA 2/11. Continuing plavix, stopped ASA - Subsequent toe amputation on 2/14, antibiotics discontinued per vascular surgery recommendations postoperatively. Will need to pull PICC prior to discharge.  Bilateral lower extremity DVTs: U/S 2/1 +DVT in right posterior tibial, peroneal veins; in left femoral and posterior tibial veins. - Continue IV heparin bridge to coumadin (started 2/15). Will need therapeutic INR prior to discharge and better idea of recommended coumadin dosing (rapid response to 2 doses of coumadin so far)  Essential hypertension:  - Continue current medications, HD  Physical deconditioning: PT OT evaluation >recommend home health PT and OT at discharge.  DVT prophylaxis: Heparin, coumadin Code Status: Full Family Communication: None at bedside Disposition Plan: DC home with home health PT/OT once INR is stable and therapeutic, coumadin dosing more clear, and minimum 5 days of overlap (first coumadin dose 2/15)  Consultants:   Cardiology  Vascular surgery  Orthopedics  Nephrology  Procedures:  11/10/2017 Eda Paschal. Donzetta Matters, MD 1.  Right internal jugular tunneled dialysis catheter 2.  Left arm brachiocephalic av fistula creation 3.  Left 2nd toe amputation  11/07/2017 [name] C. Donzetta Matters, MD 1.  US guided cannulation of right common femoral artery 2.  US guided cannulation of left anterior tibial artery 3.  Left lower extremity angiogram 4.  Drug coated balloon angioplasty of left sfa with 5 x 263mm in.pact 5.  Stent of left sfa with 5x129mm and 5x15mm innova stents 6.  Moderate sedation with fentanyl and versed for 97  minutes 7.  Attempted percutaneous closure with mynx  11/03/2017 [name] C. Donzetta Matters, MD 1.  US guided cannulation of right common femoral artery 2.   Aortogram with left lower extremity angiogram 3.  Attempted treatment of left sfa occlusion 4.  Percutaneous closure of right common femoral arteriotomy with proglide device  Multiple hemodialysis sessions  Antimicrobials:  Ceftriaxone, daptomycin completed 2/13.   Subjective: Feels fine. No trouble with pain, not constipated. No bleeding.   Objective: Vitals:   11/14/17 0754 11/14/17 0814 11/14/17 0900 11/14/17 1100  BP: (!) 57/48 (!) 104/55    Pulse: (!) 53 70    Resp: 15     Temp: 97.8 F (36.6 C)     TempSrc: Axillary     SpO2: 100%  100% 100%  Weight:      Height:        Intake/Output Summary (Last 24 hours) at 11/14/2017 1158 Last data filed at 11/13/2017 1816 Gross per 24 hour  Intake 240 ml  Output -  Net 240 ml   Filed Weights   11/12/17 1300 11/13/17 0600 11/14/17 0700  Weight: 98.5 kg (217 lb 2.5 oz) 99 kg (218 lb 4.1 oz) 99.2 kg (218 lb 11.2 oz)    Gen: 62 y.o. female in no distress Pulm: Non-labored breathing. Clear to auscultation bilaterally.  CV: Regular rate and rhythm. No murmur, rub, or gallop. No JVD, No pedal edema. GI: Abdomen soft, non-tender, non-distended, with normoactive bowel sounds. No organomegaly or masses felt. Ext: Left proximal volar forearm with +thrill, fluctuance, soft, mildly tender. Right single lumen PICC in place, nontender, no erythema. Right and left radial pulse 2+, cap refill brisk. Left foot with well-healing 2nd toe amputation site, SILT and intact motor function.  Skin: Right IJ TDC site c/d/i.  Neuro: Alert and oriented. No focal neurological deficits. Psych: Judgement and insight appear normal. Mood & affect appropriate.   CBC: Recent Labs  Lab 11/10/17 0423 11/11/17 0200 11/12/17 0428 11/13/17 0314 11/14/17 0432  WBC 8.0 7.8 8.3 5.6 6.7  HGB 7.9* 8.2* 7.7* 8.4* 8.1*  HCT 26.3* 27.6* 26.3* 28.3* 27.6*  MCV 93.3 93.9 94.3 95.0 94.5  PLT 325 342 319 292 875   Basic Metabolic Panel: Recent Labs  Lab  11/08/17 1344 11/10/17 0423 11/10/17 1602 11/11/17 0200 11/11/17 0740 11/12/17 0930  NA 136 136 134* 134*  --  133*  K 3.8 3.8 4.6 4.5  --  3.2*  CL 99* 102 102 102  --  97*  CO2 25 22 22  20*  --  25  GLUCOSE 84 108* 249* 206*  --  138*  BUN 7 17 20  22*  --  18  CREATININE 1.75* 3.06* 3.17* 3.11*  --  2.40*  CALCIUM 8.0* 8.0* 8.5* 8.7* 8.3* 8.0*  PHOS 1.8*  --  4.2  --   --  2.7   GFR: Estimated Creatinine Clearance: 29.9 mL/min (A) (by C-G formula based on SCr of 2.4 mg/dL (H)).  Coagulation Profile: Recent Labs  Lab 11/11/17 1820 11/12/17 0428 11/13/17 0314 11/14/17 0432  INR 1.36 1.40 2.73 2.62   Scheduled Meds: . amLODipine  5 mg Oral Daily  . atorvastatin  20 mg Oral q1800  . carvedilol  25 mg Oral BID WC  . clopidogrel  75 mg Oral Q breakfast  . darbepoetin (ARANESP) injection - DIALYSIS  150 mcg Intravenous Q Sat-HD  . feeding supplement (PRO-STAT SUGAR FREE 64)  30 mL Oral BID  . gabapentin  100 mg Oral  Daily  . hydrALAZINE  50 mg Oral Q8H  . isosorbide mononitrate  120 mg Oral Daily  . multivitamin  1 tablet Oral QHS  . pantoprazole  40 mg Oral Daily  . polyethylene glycol  17 g Oral Daily  . sodium chloride flush  3 mL Intravenous Q12H  . sodium chloride flush  3 mL Intravenous Q12H  . warfarin  5 mg Oral ONCE-1800  . Warfarin - Pharmacist Dosing Inpatient   Does not apply q1800   Continuous Infusions: . sodium chloride Stopped (11/10/17 1210)  . sodium chloride 250 mL (11/12/17 1100)  . sodium chloride    . sodium chloride 10 mL/hr at 11/13/17 2120  . heparin 2,150 Units/hr (11/14/17 0015)     LOS: 35 days   Time spent: 25 minutes.  Vance Gather, MD [facility] Pager [phone]  If 7PM-7AM, please contact night-coverage [email or web address] Password [facility] 11/14/2017, 11:58 AM

## 2017-11-14 NOTE — Telephone Encounter (Signed)
Sched appt 11/25/17 at 2:30. Lm on hm# to inform pt of appt.

## 2017-11-14 NOTE — Progress Notes (Signed)
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 Edrick Oh, Nephrology) discharged from Hospitalist service at [place] [facility] on 10/04/17 after 5-day hospital stay for acute osteomyelitis of second left toe with cellulitis  Transferred to [facility] due to worsening renal failure, temporary dialysis catheter was placed on 10/19/2016, the patient started dialysis on 10/20/2016,   Vascular surgery did  retrograde angiogram and stenting of left SFA on 2/11; and on 2/14 did L 2nd toe amp, left arm AV access and tunneled catheter  Patient is waiting discharge once INR therapeutic  Dialysis is planned TTS schedule as OP  Bilateral lower extremity DVTs: U/S 2/1 +DVT in right posterior tibial, peroneal veins; in left femoral and posterior tibial veins  Today is up in chair and w/o complaints.      Objective:  Vital signs in last 24 hours:  Temp:  [97.8 F (36.6 C)-98.6 F (37 C)] 97.8 F (36.6 C) (02/18 0754) Pulse Rate:  [53-77] 70 (02/18 0814) Resp:  [10-18] 15 (02/18 0754) BP: (57-137)/(48-89) 104/55 (02/18 0814) SpO2:  [95 %-100 %] 100 % (02/18 0900) Weight:  [99.2 kg (218 lb 11.2 oz)] 99.2 kg (218 lb 11.2 oz) (02/18 0700)  Weight change: -1.298 kg (-13.8 oz) Filed Weights   11/12/17 1300 11/13/17 0600 11/14/17 0700  Weight: 98.5 kg (217 lb 2.5 oz) 99 kg (218 lb 4.1 oz) 99.2 kg (218 lb 11.2 oz)    Intake/Output: I/O last 3 completed shifts: In: 440 [P.O.:440] Out: 1 [Stool:1]   Intake/Output this shift:  No intake/output data recorded.  General: Pleasant elderly female in NAD Heart: S1,S2 RRR SR 1st degree AVB on monitor. HR 70s Lungs: CTAB A/P Abdomen: Active BS Extremities: No LE edema. Access:LUA AVF + bruit. RIJ TDC Drsg CDI.   Assessment/ Plan:   1. ESRD- new start to HD. Had CKD c/b CRS.  2 wks HD here, no vol  overload, tolerating well. LUA AVF and new TDC per [facility] on 2/14. CLIP's to TTS schedule first shift at [facility].  2. Acute on chronic CHF- resolved w HD.  3. CAD- diffuse disease with no PCI targets 4. Anemia of ESRD- HGB down 7.9. 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 PVD- left second toeosteomyelitis/PVD, sp 2nd toe amp on 2/14 7. HTN/volume- stable. Dry wt prob around 98-99kg 8.   DVT - INR in range 9.   Dispo - per primary   P - HD Tuesday, 1-2 L off   [name] MD [facility] pgr [phone]   11/14/2017, 10:58 AM         Basic Metabolic Panel: Recent Labs  Lab 11/08/17 1344 11/10/17 0423 11/10/17 1602 11/11/17 0200 11/11/17 0740 11/12/17 0930  NA 136 136 134* 134*  --  133*  K 3.8 3.8 4.6 4.5  --  3.2*  CL 99* 102 102 102  --  97*  CO2 25 22 22  20*  --  25  GLUCOSE 84 108* 249* 206*  --  138*  BUN 7 17 20  22*  --  18  CREATININE 1.75* 3.06* 3.17* 3.11*  --  2.40*  CALCIUM 8.0* 8.0* 8.5* 8.7* 8.3* 8.0*  PHOS 1.8*  --  4.2  --   --  2.7    Liver Function Tests: Recent Labs  Lab 11/08/17  1344 11/10/17 1602 11/12/17 0930  ALBUMIN 2.6* 2.8* 2.5*   No results for input(s): LIPASE, AMYLASE in the last 168 hours. No results for input(s): AMMONIA in the last 168 hours.  CBC: Recent Labs  Lab 11/10/17 0423 11/11/17 0200 11/12/17 0428 11/13/17 0314 11/14/17 0432  WBC 8.0 7.8 8.3 5.6 6.7  HGB 7.9* 8.2* 7.7* 8.4* 8.1*  HCT 26.3* 27.6* 26.3* 28.3* 27.6*  MCV 93.3 93.9 94.3 95.0 94.5  PLT 325 342 319 292 271    Cardiac Enzymes: Recent Labs  Lab 11/10/17 0423  CKTOTAL 9*    BNP: Invalid input(s): POCBNP  CBG: Recent Labs  Lab 11/10/17 1221  GLUCAP 105*    Microbiology: Results for orders placed or performed during the hospital encounter of 10/10/17  Culture, blood (routine x 2)     Status: None   Collection Time: 10/19/17  2:56 PM  Result Value Ref Range Status   Specimen Description BLOOD  LEFT ANTECUBITAL  Final   Special Requests   Final    BOTTLES DRAWN AEROBIC AND ANAEROBIC Blood Culture results may not be optimal due to an inadequate volume of blood received in culture bottles   Culture NO GROWTH 5 DAYS  Final   Report Status 10/24/2017 FINAL  Final  Culture, blood (routine x 2)     Status: None   Collection Time: 10/19/17  3:00 PM  Result Value Ref Range Status   Specimen Description BLOOD LEFT HAND  Final   Special Requests   Final    BOTTLES DRAWN AEROBIC AND ANAEROBIC Blood Culture adequate volume   Culture NO GROWTH 5 DAYS  Final   Report Status 10/24/2017 FINAL  Final  Surgical PCR screen     Status: None   Collection Time: 11/01/17 12:38 AM  Result Value Ref Range Status   MRSA, PCR NEGATIVE NEGATIVE Final   Staphylococcus aureus NEGATIVE NEGATIVE Final    Comment: (NOTE) The Xpert SA Assay (FDA approved for NASAL specimens in patients 13 years of age and older), is one component of a comprehensive surveillance program. It is not intended to diagnose infection nor to guide or monitor treatment. Performed at [facility], [place] [address], [place],  [address]     Coagulation Studies: Recent Labs    11/11/17 1820 11/12/17 0428 11/13/17 0314 11/14/17 0432  LABPROT 16.7* 17.1* 28.7* 27.8*  INR 1.36 1.40 2.73 2.62    Urinalysis: No results for input(s): COLORURINE, LABSPEC, PHURINE, GLUCOSEU, HGBUR, BILIRUBINUR, KETONESUR, PROTEINUR, UROBILINOGEN, NITRITE, LEUKOCYTESUR in the last 72 hours.  Invalid input(s): APPERANCEUR    Imaging: No results found.   Medications:   . sodium chloride Stopped (11/10/17 1210)  . sodium chloride 250 mL (11/12/17 1100)  . sodium chloride    . sodium chloride 10 mL/hr at 11/13/17 2120  . heparin 2,150 Units/hr (11/14/17 0015)   . amLODipine  5 mg Oral Daily  . atorvastatin  20 mg Oral q1800  . carvedilol  25 mg Oral BID WC  . clopidogrel  75 mg Oral Q breakfast  . darbepoetin (ARANESP)  injection - DIALYSIS  150 mcg Intravenous Q Sat-HD  . feeding supplement (PRO-STAT SUGAR FREE 64)  30 mL Oral BID  . gabapentin  100 mg Oral Daily  . hydrALAZINE  50 mg Oral Q8H  . isosorbide mononitrate  120 mg Oral Daily  . multivitamin  1 tablet Oral QHS  . pantoprazole  40 mg Oral Daily  . polyethylene glycol  17 g Oral  Daily  . sodium chloride flush  3 mL Intravenous Q12H  . sodium chloride flush  3 mL Intravenous Q12H  . warfarin  5 mg Oral ONCE-1800  . Warfarin - Pharmacist Dosing Inpatient   Does not apply q1800   sodium chloride, sodium chloride, acetaminophen, camphor-menthol **AND** hydrOXYzine, docusate sodium, feeding supplement (NEPRO CARB STEADY), hydrALAZINE, hydrALAZINE, ipratropium-albuterol, labetalol, nitroGLYCERIN, ondansetron (ZOFRAN) IV, oxyCODONE, sodium chloride flush, sodium chloride flush, sodium chloride flush, sorbitol, zolpidem

## 2017-11-14 NOTE — Progress Notes (Signed)
Occupational Therapy Treatment Patient Details Name: Kristen Berry MRN: [id number] DOB: [birth year] Today's Date: 11/14/2017    History of present illness Patient is a 62 year old female who presented to the hospital with shortness of breath. She was found to have an acute kidney injury which has since progressed to ESRD (has started dialysis). Pt's course complicated by L second toe osteomyelitis/peripheral vascular disease s/p attempted left lower extremity revascularization that was unsuccessful. She has since had an amputation of the L second toe on 2/14. PMH: PVD, HTN, CHF CKD, OA    OT comments  This 62 yo female admitted with above presents to acute OT today making progress towards goals and is very motivated to get home. She will continue to benefit from acute OT with follow up [place] to get to a Mod I level.   Follow Up Recommendations  Home health OT;Supervision/Assistance - 24 hour    Equipment Recommendations  None recommended by OT       Precautions / Restrictions Precautions Precautions: Fall Restrictions Weight Bearing Restrictions: No       Mobility Bed Mobility Overal bed mobility: Needs Assistance Bed Mobility: Supine to Sit     Supine to sit: Modified independent (Device/Increase time)        Transfers Overall transfer level: Needs assistance Equipment used: Rolling walker (2 wheeled) Transfers: Sit to/from Stand Sit to Stand: Min guard         General transfer comment: increased time, rocking motion for sit>stand    Balance Overall balance assessment: Needs assistance Sitting-balance support: No upper extremity supported;Feet supported Sitting balance-Leahy Scale: Normal     Standing balance support: No upper extremity supported;During functional activity Standing balance-Leahy Scale: Fair Standing balance comment: standing at sink to comb hair                           ADL either performed or assessed with clinical judgement    ADL Overall ADL's : Needs assistance/impaired     Grooming: Brushing hair;Supervision/safety;Standing                 Lower Body Dressing Details (indicate cue type and reason): set up/S with fixing socks and donning Bil Post op shoes by crossing legs while seated EOB, min guard A sit>stand with rocking motion, increased time, and cues to come and stay forward Toilet Transfer: Min guard;Ambulation;RW;BSC Toilet Transfer Details (indicate cue type and reason): over toilet, using rocking motion for sit>stand           General ADL Comments: Educated pt on how to keep RLE dry with showering and advised her to check with dialysis people on how to keep chest dialysis port dry when showering. Her mom has a shower seat she can sit on and pt has one at her house as well.     Vision Baseline Vision/History: Wears glasses Wears Glasses: At all times Patient Visual Report: No change from baseline            Cognition Arousal/Alertness: Awake/alert Behavior During Therapy: WFL for tasks assessed/performed Overall Cognitive Status: Within Functional Limits for tasks assessed                                               Frequency  Min 2X/week        Progress Toward  Goals  OT Goals(current goals can now be found in the care plan section)  Progress towards OT goals: Progressing toward goals     Plan Discharge plan remains appropriate       AM-PAC PT "6 Clicks" Daily Activity     Outcome Measure   Help from another person eating meals?: None Help from another person taking care of personal grooming?: A Little Help from another person toileting, which includes using toliet, bedpan, or urinal?: A Little Help from another person bathing (including washing, rinsing, drying)?: A Little Help from another person to put on and taking off regular upper body clothing?: None Help from another person to put on and taking off regular lower body clothing?: A  Little 6 Click Score: 20    End of Session Equipment Utilized During Treatment: Gait belt;Oxygen(2 liters)      Activity Tolerance Patient tolerated treatment well   Patient Left (sitting EOB)           Time: 1025-8527 OT Time Calculation (min): 33 min  Charges: OT General Charges $OT Visit: 1 Visit OT Treatments $Self Care/Home Management : 23-37 mins  [place], OTR/L [phone] 11/14/2017

## 2017-11-14 NOTE — Progress Notes (Addendum)
     Pt sitting up in chair.  States she is feeling better and thinks she is gonna get to go home on Wednesday.  Says her dressing was changed earlier and Dr. Donzetta Matters had been in to see her.  She will f/u with Dr. Donzetta Matters in a couple of weeks for wound check.  Continue mobilizing.   Leontine Locket, [facility] 11/14/2017 9:52 AM   Everything healing well. Office visit. Scheduled for 3.1.19.   [name] C. Donzetta Matters, MD [facility] Office: [phone] Pager: [phone]

## 2017-11-15 ENCOUNTER — Encounter: Payer: Self-pay | Admitting: Vascular Surgery

## 2017-11-15 LAB — PROTIME-INR
INR: 2.38
Prothrombin Time: 25.8 seconds — ABNORMAL HIGH (ref 11.4–15.2)

## 2017-11-15 LAB — RENAL FUNCTION PANEL
Albumin: 2.6 g/dL — ABNORMAL LOW (ref 3.5–5.0)
Anion gap: 13 (ref 5–15)
BUN: 30 mg/dL — ABNORMAL HIGH (ref 6–20)
CO2: 23 mmol/L (ref 22–32)
Calcium: 8.3 mg/dL — ABNORMAL LOW (ref 8.9–10.3)
Chloride: 97 mmol/L — ABNORMAL LOW (ref 101–111)
Creatinine, Ser: 2.99 mg/dL — ABNORMAL HIGH (ref 0.44–1.00)
GFR calc Af Amer: 18 mL/min — ABNORMAL LOW (ref >60)
GFR calc non Af Amer: 16 mL/min — ABNORMAL LOW (ref >60)
Glucose, Bld: 127 mg/dL — ABNORMAL HIGH (ref 65–99)
Phosphorus: 2.4 mg/dL — ABNORMAL LOW (ref 2.5–4.6)
Potassium: 3.1 mmol/L — ABNORMAL LOW (ref 3.5–5.1)
Sodium: 133 mmol/L — ABNORMAL LOW (ref 135–145)

## 2017-11-15 LAB — CBC
HCT: 30 % — ABNORMAL LOW (ref 36.0–46.0)
Hemoglobin: 9.1 g/dL — ABNORMAL LOW (ref 12.0–15.0)
MCH: 28.6 pg (ref 26.0–34.0)
MCHC: 30.3 g/dL (ref 30.0–36.0)
MCV: 94.3 fL (ref 78.0–100.0)
Platelets: 305 10*3/uL (ref 150–400)
RBC: 3.18 MIL/uL — ABNORMAL LOW (ref 3.87–5.11)
RDW: 21.4 % — ABNORMAL HIGH (ref 11.5–15.5)
WBC: 9.2 10*3/uL (ref 4.0–10.5)

## 2017-11-15 LAB — HEPARIN LEVEL (UNFRACTIONATED): Heparin Unfractionated: 0.6 IU/mL (ref 0.30–0.70)

## 2017-11-15 MED ORDER — WARFARIN SODIUM 5 MG PO TABS
5.0000 mg | ORAL_TABLET | Freq: Once | ORAL | Status: AC
  Administered 2017-11-15: 5 mg via ORAL
  Filled 2017-11-15: qty 1

## 2017-11-15 MED ORDER — HYDRALAZINE HCL 25 MG PO TABS
25.0000 mg | ORAL_TABLET | Freq: Three times a day (TID) | ORAL | Status: DC
  Administered 2017-11-15 – 2017-11-16 (×2): 25 mg via ORAL
  Filled 2017-11-15 (×2): qty 1

## 2017-11-15 NOTE — Progress Notes (Signed)
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 Edrick Oh, Nephrology) discharged from Hospitalist service at [facility] on 10/04/17 after 5-day hospital stay for acute osteomyelitis of second left toe with cellulitis  Transferred to [facility] due to worsening renal failure, temporary dialysis catheter was placed on 10/19/2016, the patient started dialysis on 10/20/2016,   Vascular surgery did  retrograde angiogram and stenting of left SFA on 2/11; and on 2/14 did L 2nd toe amp, left arm AV access and tunneled catheter  Patient is waiting discharge once INR therapeutic  Dialysis is planned TTS schedule as OP  Bilateral lower extremity DVTs: U/S 2/1 +DVT in right posterior tibial, peroneal veins; in left femoral and posterior tibial veins  Today is in dialysis , no new c/o's. Going home tomorrow she says.     Objective:  Vital signs in last 24 hours:  Temp:  [97.6 F (36.4 C)-98.3 F (36.8 C)] 97.7 F (36.5 C) (02/19 0745) Pulse Rate:  [70-98] 81 (02/19 1406) Resp:  [16-20] 16 (02/19 1406) BP: (106-170)/(36-63) 110/59 (02/19 1406) SpO2:  [96 %-100 %] 100 % (02/19 1406) Weight:  [100.8 kg (222 lb 3.6 oz)-100.9 kg (222 lb 8 oz)] 100.8 kg (222 lb 3.6 oz) (02/19 0745)  Weight change: 1.724 kg (3 lb 12.8 oz) Filed Weights   11/14/17 0700 11/15/17 0425 11/15/17 0745  Weight: 99.2 kg (218 lb 11.2 oz) 100.9 kg (222 lb 8 oz) 100.8 kg (222 lb 3.6 oz)    Intake/Output: I/O last 3 completed shifts: In: 63 [P.O.:218] Out: 100 [Urine:100]   Intake/Output this shift:  Total I/O In: 360 [P.O.:360] Out: 2500 [Other:2500]  General: Pleasant elderly female in NAD Heart: S1,S2 RRR SR 1st degree AVB on monitor. HR 70s Lungs: CTAB A/P Abdomen: Active BS Extremities: No LE edema. Access:LUA AVF + bruit. RIJ TDC Drsg CDI.   Assessment/  Plan:   1. ESRD- new start to HD. Had CKD complicated by CRS.  2 wks HD here, no vol overload, tolerating well. LUA AVF and new TDC per VVS on 2/14. CLIP'd to TTS schedule first shift at [facility].  2. Acute on chronic CHF- resolved w HD.  3. CAD- diffuse disease with no PCI targets 4. Anemia of ESRD- HGB down 7.9. 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 PVD- left second toeosteomyelitis/PVD, sp 2nd toe amp on 2/14 7. HTN/volume- stable. Dry wt prob around 98-99kg 8.   DVT - INR in range 9.   Dispo - possibly going home tomorrow   P - HD today   [name] MD [employer] pgr [phone]   11/15/2017, 2:08 PM         Basic Metabolic Panel: Recent Labs  Lab 11/10/17 0423 11/10/17 1602 11/11/17 0200 11/11/17 0740 11/12/17 0930 11/15/17 0800  NA 136 134* 134*  --  133* 133*  K 3.8 4.6 4.5  --  3.2* 3.1*  CL 102 102 102  --  97* 97*  CO2 22 22 20*  --  25 23  GLUCOSE 108* 249* 206*  --  138* 127*  BUN 17 20 22*  --  18 30*  CREATININE 3.06* 3.17* 3.11*  --  2.40* 2.99*  CALCIUM 8.0* 8.5* 8.7* 8.3* 8.0* 8.3*  PHOS  --  4.2  --   --  2.7 2.4*    Liver Function Tests: Recent Labs  Lab 11/10/17 1602 11/12/17 0930 11/15/17 0800  ALBUMIN 2.8* 2.5* 2.6*   No results for input(s): LIPASE, AMYLASE in the last 168 hours. No results for input(s): AMMONIA in the last 168 hours.  CBC: Recent Labs  Lab 11/11/17 0200 11/12/17 0428 11/13/17 0314 11/14/17 0432 11/15/17 0700  WBC 7.8 8.3 5.6 6.7 9.2  HGB 8.2* 7.7* 8.4* 8.1* 9.1*  HCT 27.6* 26.3* 28.3* 27.6* 30.0*  MCV 93.9 94.3 95.0 94.5 94.3  PLT 342 319 292 271 305    Cardiac Enzymes: Recent Labs  Lab 11/10/17 0423  CKTOTAL 9*    BNP: Invalid input(s): POCBNP  CBG: Recent Labs  Lab 11/10/17 1221  GLUCAP 105*    Microbiology: Results for orders placed or performed during the hospital encounter of 10/10/17  Culture, blood (routine x 2)     Status: None    Collection Time: 10/19/17  2:56 PM  Result Value Ref Range Status   Specimen Description BLOOD LEFT ANTECUBITAL  Final   Special Requests   Final    BOTTLES DRAWN AEROBIC AND ANAEROBIC Blood Culture results may not be optimal due to an inadequate volume of blood received in culture bottles   Culture NO GROWTH 5 DAYS  Final   Report Status 10/24/2017 FINAL  Final  Culture, blood (routine x 2)     Status: None   Collection Time: 10/19/17  3:00 PM  Result Value Ref Range Status   Specimen Description BLOOD LEFT HAND  Final   Special Requests   Final    BOTTLES DRAWN AEROBIC AND ANAEROBIC Blood Culture adequate volume   Culture NO GROWTH 5 DAYS  Final   Report Status 10/24/2017 FINAL  Final  Surgical PCR screen     Status: None   Collection Time: 11/01/17 12:38 AM  Result Value Ref Range Status   MRSA, PCR NEGATIVE NEGATIVE Final   Staphylococcus aureus NEGATIVE NEGATIVE Final    Comment: (NOTE) The Xpert SA Assay (FDA approved for NASAL specimens in patients 24 years of age and older), is one component of a comprehensive surveillance program. It is not intended to diagnose infection nor to guide or monitor treatment. Performed at [facility], [place] [address] [place], [place] [address]     Coagulation Studies: Recent Labs    11/13/17 0314 11/14/17 0432 11/15/17 0247  LABPROT 28.7* 27.8* 25.8*  INR 2.73 2.62 2.38    Urinalysis: No results for input(s): COLORURINE, LABSPEC, PHURINE, GLUCOSEU, HGBUR, BILIRUBINUR, KETONESUR, PROTEINUR, UROBILINOGEN, NITRITE, LEUKOCYTESUR in the last 72 hours.  Invalid input(s): APPERANCEUR    Imaging: No results found.   Medications:   . sodium chloride Stopped (11/10/17 1210)  . sodium chloride 250 mL (11/12/17 1100)  . sodium chloride    . sodium chloride 10 mL/hr at 11/13/17 2120   . atorvastatin  20 mg Oral q1800  . carvedilol  25 mg Oral BID WC  . clopidogrel  75 mg Oral Q breakfast  . darbepoetin (ARANESP) injection -  DIALYSIS  150 mcg Intravenous Q Sat-HD  . feeding supplement (PRO-STAT SUGAR FREE 64)  30 mL Oral BID  . gabapentin  100 mg Oral Daily  . hydrALAZINE  25 mg Oral Q8H  . multivitamin  1 tablet Oral QHS  . pantoprazole  40 mg Oral Daily  . polyethylene glycol  17 g Oral Daily  . sodium chloride flush  3 mL Intravenous Q12H  . sodium chloride  flush  3 mL Intravenous Q12H  . warfarin  5 mg Oral ONCE-1800  . Warfarin - Pharmacist Dosing Inpatient   Does not apply q1800   sodium chloride, sodium chloride, acetaminophen, camphor-menthol **AND** hydrOXYzine, docusate sodium, feeding supplement (NEPRO CARB STEADY), ipratropium-albuterol, labetalol, nitroGLYCERIN, ondansetron (ZOFRAN) IV, oxyCODONE, sodium chloride flush, sodium chloride flush, sodium chloride flush, sorbitol, zolpidem

## 2017-11-15 NOTE — Progress Notes (Signed)
PT Cancellation Note  Patient Details Name: SHARITA BIENAIME MRN: [id number] DOB: 07-05-1956   Cancelled Treatment:    Reason Eval/Treat Not Completed: (P) Other (comment)(Pt off unit for HD will f/u pending return.  Will continue efforts per POC.  )   Terica Yogi [name] 11/15/2017, 12:54 PM  [name], PTA pager [phone]

## 2017-11-15 NOTE — Progress Notes (Addendum)
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 Creatinine rose to 4.7 from baseline of 3 and patient was admitted at [facility] 10/10/2017. Temporary dialysis catheter was placed on 10/19/2016, HD started on 10/20/2016, orthopedic and vascular surgery services were consulted.  Vascular surgery performed retrograde angiogram and stenting of left SFA on 2/11 and other procedures including toe amputation, left arm AV access and tunneled catheter on 2/14. Bilateral lower extremity DVTs were found on 2/1 and coumadin was started. Now she is on IV heparin bridge to coumadin   Assessment & Plan: Acute kidney injury on chronic kidney disease stage IV now progressed to ESRD: - Presented with pulmonary edema in the setting of cardiorenal syndrome -Did not respond to diuretics, nephrology consulted - Right IJ tunneled HD catheter placed, Left brachiocephalic AVF placed 1/01 - Outpatient HD arranged at [facility] TTS 1st shift - Nephrology following,  - Undergoing hemodialysis today, plan for discharge home tomorrow with outpatient dialysis on Thursday  -BP soft, stop Imdur and amlodipine, cutdown hydralazine dose today -RD consult, Renal diet education  Acute on chronic HFrEF, CAD, ischemic cardiomyopathy: - EF of 35-40%. s/p cath with multi-vessel CAD with no target for PCI and no indication for CABG.  -Remain stable, seen by cardiology earlier this admission -Blood pressure soft will stop Imdur and cutdown hydralazine -Continue Coreg -Follow-up with cardiology  Anemia of chronic kidney disease, ABLA:  - ESA and IV  iron given. Received 1u PRBCs 2/12 and 2/14. -Stable   Left second toe osteomyelitis/peripheral vascular disease:  -s/p attempted left lower extremity revascularization that was unsuccessful and she has a moderate length segment SFA occlusion on the left and a diminutive proximal SFA on 11/03/2017.   - s/p retrograde angiogram and stenting of left SFA 2/11. Continuing plavix, stopped ASA - Subsequent toe amputation on 2/14, antibiotics discontinued per vascular surgery recommendations postoperatively.  -Remove PICC line prior discharge   Bilateral lower extremity DVTs: U/S 2/1 +DVT in right posterior tibial, peroneal veins; in left femoral and posterior tibial veins. -  started on Coumadin along with heparin bridge on 2/15  - INR therapeutic since yesterday we will stop IV heparin today, continue Coumadin per pharmacy  -Suspect she will need at least 6 months of anticoagulation   Essential hypertension:  - blood pressure running low since starting hemodialysis  -Stopped Imdur and amlodipine  -Cutdown hydralazine dose in half   Physical deconditioning: PT OT evaluation >recommend home health PT and OT at discharge.  DVT prophylaxis: coumadin Code Status: Full Family Communication: None at bedside Disposition Plan: Home tomorrow with home health services  Consultants:   Cardiology  Vascular surgery  Orthopedics  Nephrology  Procedures:  11/10/2017 Eda Paschal. Donzetta Matters, MD 1.  Right internal jugular tunneled dialysis catheter 2.  Left arm brachiocephalic av fistula creation 3.  Left 2nd toe amputation  11/07/2017 [name] C. Donzetta Matters, MD 1.  US guided cannulation of right common femoral artery 2.  US guided cannulation of left anterior tibial artery 3.  Left lower extremity angiogram 4.  Drug coated balloon angioplasty of left sfa with 5 x 267mm in.pact 5.  Stent of left sfa with 5x144mm and 5x121mm innova stents 6.  Moderate sedation with fentanyl and versed for 97 minutes 7.   Attempted percutaneous closure with mynx  11/03/2017 [name] C. Donzetta Matters, MD 1.  US guided cannulation of right common femoral artery 2.  Aortogram with left lower extremity angiogram 3.  Attempted treatment of left sfa occlusion 4.  Percutaneous closure of right common femoral arteriotomy with proglide device  Multiple hemodialysis sessions  Antimicrobials:  Ceftriaxone, daptomycin completed 2/13.   Subjective: -Overall okay, was dizzy this morning  Objective: Vitals:   11/15/17 0945 11/15/17 1015 11/15/17 1045 11/15/17 1115  BP: (!) 135/56 (!) 133/43 (!) 132/51 (!) 127/45  Pulse: 75 73 73 74  Resp:      Temp:      TempSrc:      SpO2:    100%  Weight:      Height:        Intake/Output Summary (Last 24 hours) at 11/15/2017 1220 Last data filed at 11/14/2017 2100 Gross per 24 hour  Intake 118 ml  Output 100 ml  Net 18 ml   Filed Weights   11/14/17 0700 11/15/17 0425 11/15/17 0745  Weight: 99.2 kg (218 lb 11.2 oz) 100.9 kg (222 lb 8 oz) 100.8 kg (222 lb 3.6 oz)   Gen: Elderly pleasant female, no distress Awake, Alert, Oriented X 3,  HEENT: PERRLA, Neck supple, no JVD Lungs: Clear bilaterally CVS: S1-S2/regulate rhythm Abd: soft, Non tender, non distended, BS present Extremities: No edema, left foot second toe amputation site healing well Skin: no new rashes Right IJ temporary dialysis catheter noted   CBC: Recent Labs  Lab 11/11/17 0200 11/12/17 0428 11/13/17 0314 11/14/17 0432 11/15/17 0700  WBC 7.8 8.3 5.6 6.7 9.2  HGB 8.2* 7.7* 8.4* 8.1* 9.1*  HCT 27.6* 26.3* 28.3* 27.6* 30.0*  MCV 93.9 94.3 95.0 94.5 94.3  PLT 342 319 292 271 160   Basic Metabolic Panel: Recent Labs  Lab 11/08/17 1344 11/10/17 0423 11/10/17 1602 11/11/17 0200 11/11/17 0740 11/12/17 0930 11/15/17 0800  NA 136 136 134* 134*  --  133* 133*  K 3.8 3.8 4.6 4.5  --  3.2* 3.1*  CL 99* 102 102 102  --  97* 97*  CO2 25 22 22  20*  --  25 23  GLUCOSE 84 108* 249* 206*  --  138* 127*  BUN  7 17 20  22*  --  18 30*  CREATININE 1.75* 3.06* 3.17* 3.11*  --  2.40* 2.99*  CALCIUM 8.0* 8.0* 8.5* 8.7* 8.3* 8.0* 8.3*  PHOS 1.8*  --  4.2  --   --  2.7 2.4*   GFR: Estimated Creatinine Clearance: 24.2 mL/min (A) (by C-G formula based on SCr of 2.99 mg/dL (H)).  Coagulation Profile: Recent Labs  Lab 11/11/17 1820 11/12/17 0428 11/13/17 0314 11/14/17 0432 11/15/17 0247  INR 1.36 1.40 2.73 2.62 2.38   Scheduled Meds: . atorvastatin  20 mg Oral q1800  . carvedilol  25 mg Oral BID WC  . clopidogrel  75 mg Oral Q breakfast  . darbepoetin (ARANESP) injection - DIALYSIS  150 mcg Intravenous Q Sat-HD  . feeding supplement (PRO-STAT SUGAR FREE 64)  30 mL Oral BID  . gabapentin  100 mg Oral Daily  . hydrALAZINE  25 mg Oral Q8H  . multivitamin  1 tablet Oral QHS  . pantoprazole  40 mg Oral Daily  . polyethylene glycol  17 g Oral Daily  . sodium chloride flush  3 mL Intravenous Q12H  . sodium chloride flush  3 mL Intravenous Q12H  . warfarin  5 mg Oral ONCE-1800  . Warfarin - Pharmacist Dosing Inpatient   Does not apply q1800   Continuous Infusions: . sodium chloride Stopped (11/10/17 1210)  . sodium chloride 250 mL (11/12/17 1100)  . sodium chloride    . sodium chloride 10 mL/hr at 11/13/17 2120     LOS: 36 days   Time spent: 35 minutes.  Domenic Polite, MD [facility] Page via [email or web address] password [email or web address]  If 7PM-7AM, please contact night-coverage  11/15/2017, 12:20 PM

## 2017-11-15 NOTE — Discharge Instructions (Signed)

## 2017-11-15 NOTE — Progress Notes (Signed)
[place] for Heparin/warfarin Indication: DVT  Patient Measurements: Height: 5\' 8"  (172.7 cm) Weight: 222 lb 3.6 oz (100.8 kg) IBW/kg (Calculated) : 63.9 Heparin Dosing Weight: 95 kg   Assessment: 79 YOF presented on 10/10/17 with worsening renal function and found to have a DVT. Patient is s/p cannulation of femoral and tibial arteries and stenting 11/07/17. Left 2nd toe amputation, left arm AVF, and right tunneled catheter placed on 2/14. She is also noted on plavix for PAD (s/p DCBA and stents on 2/11) -Heparin resumed 2/14 post-procedure and at goal on 2150 units/hr.  -INR= 2.62 (3 doses of coumadin since 2/15); day 5 of overlap  Goal of Therapy:  Heparin level 0.3-0.7 units/ml  INR 2-3 Monitor platelets by anticoagulation protocol: Yes   Plan:  -Continue heparin at 2150 units/hr (to complete a minimum 5 days; currently day 5) -Could consider d/c heparin 2/20 (5 days overlap and INR > goal x2) -Warfarin 5mg  po today -Would consider discharge on warfarin 5mg  po daily with close INR follow-up -Daily heparin level/CBC/INR  Hildred Laser, Pharm D 11/15/2017 8:23 AM

## 2017-11-16 LAB — PROTIME-INR
INR: 2.5
Prothrombin Time: 26.8 seconds — ABNORMAL HIGH (ref 11.4–15.2)

## 2017-11-16 MED ORDER — CARVEDILOL 12.5 MG PO TABS: 12.5000 mg | ORAL_TABLET | Freq: Two times a day (BID) | ORAL | 0 refills | Status: DC

## 2017-11-16 MED ORDER — HYDRALAZINE HCL 25 MG PO TABS: 25.0000 mg | ORAL_TABLET | Freq: Two times a day (BID) | ORAL | 0 refills | Status: DC

## 2017-11-16 MED ORDER — WARFARIN SODIUM 4 MG PO TABS: 4.0000 mg | ORAL_TABLET | Freq: Once | ORAL | Status: DC

## 2017-11-16 MED ORDER — CARVEDILOL 12.5 MG PO TABS
12.5000 mg | ORAL_TABLET | Freq: Two times a day (BID) | ORAL | Status: DC
  Administered 2017-11-16: 12.5 mg via ORAL
  Filled 2017-11-16: qty 1

## 2017-11-16 MED ORDER — ATORVASTATIN CALCIUM 20 MG PO TABS: 20.0000 mg | ORAL_TABLET | Freq: Every day | ORAL | 0 refills | Status: AC

## 2017-11-16 MED ORDER — WARFARIN SODIUM 5 MG PO TABS: 5.0000 mg | ORAL_TABLET | Freq: Every day | ORAL | 0 refills | Status: DC

## 2017-11-16 MED ORDER — PANTOPRAZOLE SODIUM 40 MG PO TBEC: 40.0000 mg | DELAYED_RELEASE_TABLET | Freq: Every day | ORAL | 0 refills | Status: DC

## 2017-11-16 MED ORDER — CLOPIDOGREL BISULFATE 75 MG PO TABS: 75.0000 mg | ORAL_TABLET | Freq: Every day | ORAL | 0 refills | Status: DC

## 2017-11-16 NOTE — Care Management Note (Signed)
Case Management Note Original Note Created Bethena Roys, RN 10/24/2017, 3:18 PM    Patient Details  Name: Kristen Berry MRN: [id number] Date of Birth: [birth year]  Subjective/Objective: Pt presented as a transfer from [facility]. CKD Stage 4 Baseline Cr 2.4. Treating Left toe osteomyelitis- continues on IV Rocephin and Daptomycin. Worsening renal failure- has temporary cath & has had 2 Sessions of HD. HD is being held 2/2 to restart of IV Lasix.                   Action/Plan: Pt/OT recommendations for Home with [facility]. CM will continue to monitor for additional needs.   Additional CM follow up notes:  Zenon Mayo, RN 11/02/2017, 1:11 PM---s/p heart cath per cath report-There is no ideal targets for PCI.  The diffuse nature of disease would make coronary bypass surgery difficult -she has a necrotic toe which she needs to have an arteriogram and amputation,  procedures are all pending her complete coronary workup, Continue antibiotics and will hopefully be able get lower extremity angiogram performed by end of the week per Vascular Surgery. Will need HH services set up after surgery.  2/8 [place], BSN-Unsuccessfulleft lower extremity revascularization. Plan for left femoral to popliteal artery bypass next week. Also needs tunneled dialysis catheter and consideration of left arm fistula versus graft prior to discharge. Also found to have R lower extremity DVT >> on heparin.  NCM left [facility] agency list in room with patient to look over.     Expected Discharge Date:   11/16/17           Expected Discharge Plan:  [place]  In-House Referral:  NA  Discharge planning Services  CM Consult  Post Acute Care Choice:  Home Health Choice offered to:  Patient  DME Arranged:  N/A DME Agency:  NA  HH Arranged:  PT, OT HH Agency:  [facility]  Status of Service:  Completed, signed off  If discussed at [place] of Stay Meetings, dates discussed:    Discharge Disposition: home/home health   Additional Comments:  11/16/17- 3- [name] RN, CM- pt for d/c home today- per pt she plans to go to her mom's house for awhile- address to mom-Shirley Pendell- is [place]. [place]   Phone [phone] Discussed transition needs- pt states she has a RW at home- orders for HHPT/OT placed- pt has referral per vascular to [facility]- discussed this with pt and offered choice- pt states she is fine using [facility] for services- notified Tiffany with [facility] of pt's discharge and address where she will be.   Marvetta Gibbons [place], RN 11/16/2017, 11:03 AM [phone] 4E Transition Care Coordinator

## 2017-11-16 NOTE — Progress Notes (Signed)
Order received to discharge patient.  Telemetry monitor removed and CCMD notified.  PIV access removed.  Discharge instructions, follow up, medications and instructions for their use discussed with patient. 

## 2017-11-16 NOTE — Progress Notes (Signed)
[facility] for Heparin/warfarin Indication: DVT  Patient Measurements: Height: 5\' 8"  (172.7 cm) Weight: 216 lb 1.6 oz (98 kg) IBW/kg (Calculated) : 63.9 Heparin Dosing Weight: 95 kg  Assessment: 70 YOF presented on 10/10/17 with worsening renal function and found to have a DVT. Patient is s/p cannulation of femoral and tibial arteries and stenting 11/07/17. Left 2nd toe amputation, left arm AVF, and right tunneled catheter placed on 2/14. She is also noted on plavix for PAD (s/p DCBA and stents on 2/11)  Since has received 5 days of overlap, heparin infusion has been discontinued. INR today trending upwards to 2.5 from 2.38 (dose was held on 2/17). CBC has been stable. No signs/symptoms of bleeding.   Goal of Therapy:  INR 2-3 Monitor platelets by anticoagulation protocol: Yes   Plan:  -Will give one time dose of 4 mg tonight prior to discharge  -Would recommend warfarin 5 mg at discharge with follow up INR appointment later this week or early next week  -Monitor daily INR and CBC while inpatient  -Monitor for signs/symptoms of bleeding  Doylene Canard, PharmD Clinical Pharmacist  Pager: [phone] Clinical Phone for 11/16/2017 until 3:30pm: [phone] If after 3:30pm, please call main pharmacy at [phone] 11/16/2017 10:16 AM

## 2017-11-16 NOTE — Progress Notes (Signed)
Physical Therapy Treatment Patient Details Name: Kristen Berry MRN: [id number] DOB: Nov 21, 1955 Today's Date: 11/16/2017    History of Present Illness Patient is a 62 year old female who presented to the hospital with shortness of breath. She was found to have an acute kidney injury which has since progressed to ESRD (has started dialysis). Pt's course complicated by L second toe osteomyelitis/peripheral vascular disease s/p attempted left lower extremity revascularization that was unsuccessful. She has since had an amputation of the L second toe on 2/14. PMH: PVD, HTN, CHF CKD, OA     PT Comments    Pt performed gait training during session this am with improved technique.  Pt remains to present with poor ability to stand from standard height seated surface.  Educated patient on hand placement and issued gait belt to allow her brother to assist with sit to stands.  Pt to d/c home to her mother house with intermittent support from aides and family.  Pt will continue to require HHPT at d/c.      Follow Up Recommendations  Home health PT     Equipment Recommendations  Rolling walker with 5" wheels    Recommendations for Other Services       Precautions / Restrictions Precautions Precautions: Fall Restrictions Weight Bearing Restrictions: Yes LLE Weight Bearing: Weight bearing as tolerated Other Position/Activity Restrictions: L WBAT in post op shoe    Mobility  Bed Mobility               General bed mobility comments: Pt sitting on edge of bed on arrival.    Transfers Overall transfer level: Needs assistance Equipment used: Rolling walker (2 wheeled) Transfers: Sit to/from Stand Sit to Stand: Mod assist         General transfer comment: Pt required mod assistance to boost into standing after several unsuccessful attempts.  Suggested to patient's brother that she may need a firm surface or railing added to the couch surface to stand.     Ambulation/Gait Ambulation/Gait assistance: Min guard Ambulation Distance (Feet): 100 Feet Assistive device: Rolling walker (2 wheeled) Gait Pattern/deviations: Step-through pattern;Decreased stride length;Trunk flexed;Shuffle Gait velocity: Decreased Gait velocity interpretation: Below normal speed for age/gender General Gait Details: Cues for forward gaze patient with improved foot clearance during gait training.  Pt continues to present with increased WOB during gait training.     Stairs            Wheelchair Mobility    Modified Rankin (Stroke Patients Only)       Balance Overall balance assessment: Needs assistance   Sitting balance-Leahy Scale: Normal       Standing balance-Leahy Scale: Poor                              Cognition Arousal/Alertness: Awake/alert Behavior During Therapy: WFL for tasks assessed/performed Overall Cognitive Status: Within Functional Limits for tasks assessed                                        Exercises      General Comments        Pertinent Vitals/Pain Pain Assessment: 0-10 Pain Score: 4  Pain Location: L foot Pain Descriptors / Indicators: Discomfort Pain Intervention(s): Monitored during session;Repositioned    Home Living  Prior Function            PT Goals (current goals can now be found in the care plan section) Acute Rehab PT Goals Patient Stated Goal: Feel stronger  Potential to Achieve Goals: Good Progress towards PT goals: Progressing toward goals    Frequency    Min 3X/week      PT Plan Current plan remains appropriate    Co-evaluation              AM-PAC PT "6 Clicks" Daily Activity  Outcome Measure  Difficulty turning over in bed (including adjusting bedclothes, sheets and blankets)?: None Difficulty moving from lying on back to sitting on the side of the bed? : None Difficulty sitting down on and standing up from a  chair with arms (e.g., wheelchair, bedside commode, etc,.)?: Unable Help needed moving to and from a bed to chair (including a wheelchair)?: A Lot Help needed walking in hospital room?: A Little Help needed climbing 3-5 steps with a railing? : A Little 6 Click Score: 17    End of Session Equipment Utilized During Treatment: Gait belt Activity Tolerance: Patient limited by fatigue Patient left: in bed;with call bell/phone within reach;Other (comment) Nurse Communication: Mobility status PT Visit Diagnosis: Difficulty in walking, not elsewhere classified (R26.2);Other abnormalities of gait and mobility (R26.89)     Time: 2505-3976 PT Time Calculation (min) (ACUTE ONLY): 23 min  Charges:  $Gait Training: 8-22 mins $Therapeutic Activity: 8-22 mins                    G Codes:       [name], PTA pager [phone]    [name] 11/16/2017, 11:13 AM

## 2017-11-16 NOTE — Progress Notes (Signed)
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 Edrick Oh, Nephrology) discharged from Hospitalist service at [place] on 10/04/17 after 5-day hospital stay for acute osteomyelitis of second left toe with cellulitis  Transferred to [facility] due to worsening renal failure, temporary dialysis catheter was placed on 10/19/2016, the patient started dialysis on 10/20/2016,   Vascular surgery did  retrograde angiogram and stenting of left SFA on 2/11; and on 2/14 did L 2nd toe amp, left arm AV access and tunneled catheter  Patient is waiting discharge once INR therapeutic  Dialysis is planned TTS schedule as OP  Bilateral lower extremity DVTs: U/S 2/1 +DVT in right posterior tibial, peroneal veins; in left femoral and posterior tibial veins  Today: going home today    Objective:  Vital signs in last 24 hours:  Temp:  [97.8 F (36.6 C)-98.4 F (36.9 C)] 98.2 F (36.8 C) (02/20 0532) Pulse Rate:  [72-81] 77 (02/20 0532) Resp:  [16-27] 27 (02/20 0532) BP: (110-157)/(47-59) 155/48 (02/20 0532) SpO2:  [97 %-100 %] 98 % (02/20 0532) Weight:  [98 kg (216 lb 1.6 oz)] 98 kg (216 lb 1.6 oz) (02/20 0532)  Weight change: -0.125 kg (-4.4 oz) Filed Weights   11/15/17 0425 11/15/17 0745 11/16/17 0532  Weight: 100.9 kg (222 lb 8 oz) 100.8 kg (222 lb 3.6 oz) 98 kg (216 lb 1.6 oz)    Intake/Output: I/O last 3 completed shifts: In: 600 [P.O.:600] Out: 2600 [Urine:100; Other:2500]   Intake/Output this shift:  Total I/O In: 120 [P.O.:120] Out: -   General: Pleasant elderly female in NAD Heart: S1,S2 RRR SR 1st degree AVB on monitor. HR 70s Lungs: CTAB A/P Abdomen: Active BS Extremities: No LE edema. Access:LUA AVF + bruit. RIJ TDC Drsg CDI.   Assessment/ Plan:   1. ESRD- new start to HD. Had CKD complicated by CRS.  2 wks HD here, no  vol overload, tolerating well. LUA AVF and new TDC per [facility] on 2/14. CLIP'd to TTS schedule first shift at [facility].  2. Acute on chronic CHF- resolved w HD.  3. CAD- diffuse disease with no PCI targets 4. Anemia of ESRD- HGB down 7.9. 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 PVD- left second toeosteomyelitis/PVD, sp 2nd toe amp on 2/14 7. HTN/volume- stable. Dry wt prob around 98-99kg 8.   DVT - INR in range 9.   Dispo - for dc today   P - for dc today   [name] MD [employer] pgr [phone]   11/16/2017, 11:21 AM         Basic Metabolic Panel: Recent Labs  Lab 11/10/17 0423 11/10/17 1602 11/11/17 0200 11/11/17 0740 11/12/17 0930 11/15/17 0800  NA 136 134* 134*  --  133* 133*  K 3.8 4.6 4.5  --  3.2* 3.1*  CL 102 102 102  --  97* 97*  CO2 22 22 20*  --  25 23  GLUCOSE 108* 249* 206*  --  138* 127*  BUN 17 20 22*  --  18 30*  CREATININE 3.06* 3.17* 3.11*  --  2.40* 2.99*  CALCIUM 8.0* 8.5* 8.7* 8.3* 8.0* 8.3*  PHOS  --  4.2  --   --  2.7 2.4*    Liver Function Tests: Recent Labs  Lab 11/10/17 1602  11/12/17 0930 11/15/17 0800  ALBUMIN 2.8* 2.5* 2.6*   No results for input(s): LIPASE, AMYLASE in the last 168 hours. No results for input(s): AMMONIA in the last 168 hours.  CBC: Recent Labs  Lab 11/11/17 0200 11/12/17 0428 11/13/17 0314 11/14/17 0432 11/15/17 0700  WBC 7.8 8.3 5.6 6.7 9.2  HGB 8.2* 7.7* 8.4* 8.1* 9.1*  HCT 27.6* 26.3* 28.3* 27.6* 30.0*  MCV 93.9 94.3 95.0 94.5 94.3  PLT 342 319 292 271 305    Cardiac Enzymes: Recent Labs  Lab 11/10/17 0423  CKTOTAL 9*    BNP: Invalid input(s): POCBNP  CBG: Recent Labs  Lab 11/10/17 1221  GLUCAP 105*    Microbiology: Results for orders placed or performed during the hospital encounter of 10/10/17  Culture, blood (routine x 2)     Status: None   Collection Time: 10/19/17  2:56 PM  Result Value Ref Range Status   Specimen Description BLOOD  LEFT ANTECUBITAL  Final   Special Requests   Final    BOTTLES DRAWN AEROBIC AND ANAEROBIC Blood Culture results may not be optimal due to an inadequate volume of blood received in culture bottles   Culture NO GROWTH 5 DAYS  Final   Report Status 10/24/2017 FINAL  Final  Culture, blood (routine x 2)     Status: None   Collection Time: 10/19/17  3:00 PM  Result Value Ref Range Status   Specimen Description BLOOD LEFT HAND  Final   Special Requests   Final    BOTTLES DRAWN AEROBIC AND ANAEROBIC Blood Culture adequate volume   Culture NO GROWTH 5 DAYS  Final   Report Status 10/24/2017 FINAL  Final  Surgical PCR screen     Status: None   Collection Time: 11/01/17 12:38 AM  Result Value Ref Range Status   MRSA, PCR NEGATIVE NEGATIVE Final   Staphylococcus aureus NEGATIVE NEGATIVE Final    Comment: (Berry) The Xpert SA Assay (FDA approved for NASAL specimens in patients 59 years of age and older), is one component of a comprehensive surveillance program. It is not intended to diagnose infection nor to guide or monitor treatment. Performed at [facility], [place] [address] [place], [place] [address]     Coagulation Studies: Recent Labs    11/14/17 0432 11/15/17 0247 11/16/17 0325  LABPROT 27.8* 25.8* 26.8*  INR 2.62 2.38 2.50    Urinalysis: No results for input(s): COLORURINE, LABSPEC, PHURINE, GLUCOSEU, HGBUR, BILIRUBINUR, KETONESUR, PROTEINUR, UROBILINOGEN, NITRITE, LEUKOCYTESUR in the last 72 hours.  Invalid input(s): APPERANCEUR    Imaging: No results found.   Medications:   . sodium chloride Stopped (11/10/17 1210)  . sodium chloride 250 mL (11/12/17 1100)  . sodium chloride    . sodium chloride 10 mL/hr at 11/13/17 2120   . atorvastatin  20 mg Oral q1800  . carvedilol  12.5 mg Oral BID WC  . clopidogrel  75 mg Oral Q breakfast  . darbepoetin (ARANESP) injection - DIALYSIS  150 mcg Intravenous Q Sat-HD  . feeding supplement (PRO-STAT SUGAR FREE 64)  30 mL  Oral BID  . gabapentin  100 mg Oral Daily  . hydrALAZINE  25 mg Oral Q8H  . multivitamin  1 tablet Oral QHS  . pantoprazole  40 mg Oral Daily  . polyethylene glycol  17 g Oral Daily  . sodium chloride flush  3 mL Intravenous Q12H  . sodium chloride flush  3 mL Intravenous Q12H  . warfarin  4 mg Oral Once  .  Warfarin - Pharmacist Dosing Inpatient   Does not apply q1800   sodium chloride, sodium chloride, acetaminophen, camphor-menthol **AND** hydrOXYzine, docusate sodium, feeding supplement (NEPRO CARB STEADY), ipratropium-albuterol, labetalol, nitroGLYCERIN, ondansetron (ZOFRAN) IV, oxyCODONE, sodium chloride flush, sodium chloride flush, sodium chloride flush, sorbitol, zolpidem

## 2017-11-16 NOTE — Discharge Summary (Signed)
Physician Discharge Summary  Kristen Berry EZM:[id number] DOB: [birth year] DOA: 10/10/2017  PCP: Martinique, Sarah T, MD  Admit date: 10/10/2017 Discharge date: 11/16/2017  Time spent: 45 minutes  Recommendations for Outpatient Follow-up:  1. [facility] 2/21 for next HD 2. Continue Coumadin atleast for 37months for acute DVT, next INR check on Saturday at HD 3. FU With [facility] Dr.Cain -duration of Plavix following SFA stenting per Dr.Cain 4. PCP in 1 week 5. Home health PT/OT   Discharge Diagnoses:  Principal Problem:   Acute on chronic systolic CHF (congestive heart failure), NYHA class 3 (HCC)   AKI on CKD 4   Cardiorenal syndrome   Type 2 diabetes mellitus with neurologic complication, without long-term current use of insulin (HCC)   Stage 4 chronic kidney disease (HCC)   Essential hypertension   ARF (acute renal failure) (HCC)   AKI (acute kidney injury) (HCC)   Acute osteomyelitis of toe of left foot (HCC)   PVD   S/p SFA stenting   PICC (peripherally inserted central catheter) in place   Chest pain   Coronary artery disease due to lipid rich plaque   Acute DVT   Discharge Condition: improved, stable  Diet recommendation: Renal/DM  Filed Weights   11/15/17 0425 11/15/17 0745 11/16/17 0532  Weight: 100.9 kg (222 lb 8 oz) 100.8 kg (222 lb 3.6 oz) 98 kg (216 lb 1.6 oz)    History of present illness:  Kristen Berry is a 62 year old female with PMH of DM, HTN, MGUS, chronic combined CHF (LVEF 35%), PAD, stage IV CKD, recent 5 day admission at [facility] 10/04/2017 for acute osteomyelitis of second left toe with cellulitis discharged on daily infusions at [facility] of ceftriaxone and daptomycin via PICC line. Creatinine rose to 4.7 from baseline of 3 and patient was admitted at [facility] 10/10/2017  Hospital Course:   Acute kidney injury on chronic kidney disease stage IV now progressed to ESRD: - Presented with pulmonary edema in the setting of cardiorenal  syndrome -Did not respond to diuretics, nephrology consulted - Right IJtunneled HD catheter placed, Left brachiocephalic AVF placed 5/03 - Outpatient HD arranged at [facility] 1st shift - Nephrology following,  - plan for discharge home today with outpatient dialysis on Thursday  -BP soft, stopped Imdur and amlodipine, cutdown hydralazine dose  -RD consulted, Renal diet education completed  Acute on chronic HFrEF, CAD, ischemic cardiomyopathy: -EF of 35-40%. s/p cath with multi-vessel CAD with no target for PCI and no indication for CABG.  -Remain stable, seen by cardiology earlier this admission -Blood pressure soft, stopped Imdur and cutdown hydralazine -Continue Coreg -Follow-up with cardiology  Anemia of chronic kidney disease, ABLA: - ESA and IV iron given. Received 1u PRBCs 2/12 and 2/14. -Stable   Left second toe osteomyelitis/Peripheral vascular disease:  -[facility] consulted -s/p attempted left lower extremity revascularization that was unsuccessful and she has a moderate length segment SFA occlusion on the left and a diminutive proximal SFA on 11/03/2017.  - s/pretrograde angiogram and stenting of left SFA 2/11 by Dr.Cain - Continuing plavix, stopped ASA - Subsequent toe amputation on 2/14, antibiotics discontinued per vascular surgery recommendations postoperatively.   Bilateral lower extremity DVTs: U/S 2/1 +DVT in right posterior tibial, peroneal veins; in left femoral and posterior tibial veins. -  started on Coumadin along with heparin bridge on 2/15  - INR therapeutic since 2/18, stopped IV heparin yesterday, continue Coumadin, next INR check on Saturday at HD -Suspect she will need at  least 6 months of anticoagulation   Essential hypertension:  - blood pressure running low since starting hemodialysis  -Stopped Imdur and amlodipine  -Cutdown hydralazine dose in half, continue Coreg  Physical deconditioning:PT OT evaluation >recommend home health PT and OT at  discharge.   Consultants:   Cardiology  Vascular surgery  Orthopedics  Nephrology  Procedures:  11/10/2017 Eda Paschal. Donzetta Matters, MD 1.  Right internal jugular tunneled dialysis catheter 2.  Left arm brachiocephalic av fistula creation 3.  Left 2nd toe amputation  11/07/2017 [name] C. Donzetta Matters, MD 1.  US guided cannulation of right common femoral artery 2.  US guided cannulation of left anterior tibial artery 3.  Left lower extremity angiogram 4.  Drug coated balloon angioplasty of left sfa with 5 x 255mm in.pact 5.  Stent of left sfa with 5x152mm and 5x163mm innova stents 6.  Moderate sedation with fentanyl and versed for 97 minutes 7.  Attempted percutaneous closure with mynx  11/03/2017 [name] C. Donzetta Matters, MD 1.  US guided cannulation of right common femoral artery 2.  Aortogram with left lower extremity angiogram 3.  Attempted treatment of left sfa occlusion 4.  Percutaneous closure of right common femoral arteriotomy with proglide device  Multiple hemodialysis sessions     Discharge Exam: Vitals:   11/15/17 2108 11/16/17 0532  BP: (!) 147/49 (!) 155/48  Pulse:  77  Resp: (!) 21 (!) 27  Temp: 98.4 F (36.9 C) 98.2 F (36.8 C)  SpO2: 97% 98%    General: AAOx3 Cardiovascular: S1S2/RRR Respiratory: CTAB  Discharge Instructions   Discharge Instructions    Call MD for:  difficulty breathing, headache or visual disturbances   Complete by:  As directed    Call MD for:  extreme fatigue   Complete by:  As directed    Call MD for:  hives   Complete by:  As directed    Call MD for:  persistant dizziness or light-headedness   Complete by:  As directed    Call MD for:  persistant nausea and vomiting   Complete by:  As directed    Call MD for:  severe uncontrolled pain   Complete by:  As directed    Call MD for:  temperature >100.4   Complete by:  As directed    Diet - low sodium heart healthy   Complete by:  As directed    Diet - low sodium heart healthy    Complete by:  As directed    Discharge instructions   Complete by:  As directed    Please continue to follow-up with your vascular surgeon and orthopedics as planned.   Home infusion instructions [facility] May follow [place] Dosing Protocol; May administer Cathflo as needed to maintain patency of vascular access device.; Flushing of vascular access device: per [facility] Protocol: 0.9% NaCl pre/post medica...   Complete by:  As directed    This is the resumption of your IV antibiotics that you were receiving before admission.  Please follow-up with your PCP and recommended to follow-up with infectious disease.   Instructions:  May follow [place] Dosing Protocol   Instructions:  May administer Cathflo as needed to maintain patency of vascular access device.   Instructions:  Flushing of vascular access device: per [facility] Protocol: 0.9% NaCl pre/post medication administration and prn patency; Heparin 100 u/ml, 44ml for implanted ports and Heparin 10u/ml, 37ml for all other central venous catheters.   Instructions:  May follow [facility] Anaphylaxis Protocol for First  Dose Administration in the home: 0.9% NaCl at 25-50 ml/hr to maintain IV access for protocol meds. Epinephrine 0.3 ml IV/IM PRN and Benadryl 25-50 IV/IM PRN s/s of anaphylaxis.   Instructions:  [facility] Infusion Coordinator (RN) to assist per patient IV care needs in the home PRN.   Increase activity slowly   Complete by:  As directed    Increase activity slowly   Complete by:  As directed      Allergies as of 11/16/2017      Reactions   Crestor [rosuvastatin Calcium] Other (See Comments)   Leg pain      Medication List    STOP taking these medications   amLODipine 10 MG tablet Commonly known as:  NORVASC   aspirin EC 81 MG tablet   furosemide 40 MG tablet Commonly known as:  LASIX   isosorbide mononitrate 60 MG 24 hr tablet Commonly known as:  IMDUR   labetalol 200 MG tablet Commonly known as:  NORMODYNE    losartan 100 MG tablet Commonly known as:  COZAAR     TAKE these medications   acetaminophen 500 MG tablet Commonly known as:  TYLENOL Take 1,500 mg by mouth at bedtime as needed for headache (pain).   atorvastatin 20 MG tablet Commonly known as:  LIPITOR Take 1 tablet (20 mg total) by mouth daily at 6 PM.   carvedilol 12.5 MG tablet Commonly known as:  COREG Take 1 tablet (12.5 mg total) by mouth 2 (two) times daily with a meal.   clopidogrel 75 MG tablet Commonly known as:  PLAVIX Take 1 tablet (75 mg total) by mouth daily with breakfast. Start taking on:  11/17/2017   gabapentin 100 MG capsule Commonly known as:  NEURONTIN Take 100 mg by mouth at bedtime as needed (nerve pain).   hydrALAZINE 25 MG tablet Commonly known as:  APRESOLINE Take 1 tablet (25 mg total) by mouth 2 (two) times daily. What changed:    medication strength  how much to take   pantoprazole 40 MG tablet Commonly known as:  PROTONIX Take 1 tablet (40 mg total) by mouth daily. Start taking on:  11/17/2017   PRESCRIPTION MEDICATION Inject into the vein daily at 12 noon.   PROAIR HFA 108 (90 Base) MCG/ACT inhaler Generic drug:  albuterol Inhale 2 puffs into the lungs every 6 (six) hours as needed for wheezing or shortness of breath.   warfarin 5 MG tablet Commonly known as:  COUMADIN Take 1 tablet (5 mg total) by mouth daily. Check INR at Next Dialysis on Saturday            Home Infusion Instuctions  (From admission, onward)        Start     Ordered   10/14/17 0000  Home infusion instructions [facility] May follow [facility] Dosing Protocol; May administer Cathflo as needed to maintain patency of vascular access device.; Flushing of vascular access device: per [facility] Protocol: 0.9% NaCl pre/post medica...    Comments:  This is the resumption of your IV antibiotics that you were receiving before admission.  Please follow-up with your PCP and recommended to follow-up with  infectious disease.  Question Answer Comment  Instructions May follow [place] Dosing Protocol   Instructions May administer Cathflo as needed to maintain patency of vascular access device.   Instructions Flushing of vascular access device: per [facility] Protocol: 0.9% NaCl pre/post medication administration and prn patency; Heparin 100 u/ml, 9ml for implanted ports and  Heparin 10u/ml, 77ml for all other central venous catheters.   Instructions May follow [facility] Anaphylaxis Protocol for First Dose Administration in the home: 0.9% NaCl at 25-50 ml/hr to maintain IV access for protocol meds. Epinephrine 0.3 ml IV/IM PRN and Benadryl 25-50 IV/IM PRN s/s of anaphylaxis.   Instructions [facility] Infusion Coordinator (RN) to assist per patient IV care needs in the home PRN.      10/14/17 1133     Allergies  Allergen Reactions  . Crestor [Rosuvastatin Calcium] Other (See Comments)    Leg pain   Follow-up Information    Martinique, Sarah T, MD. Schedule an appointment as soon as possible for a visit in 1 week(s).   Specialty:  Family Medicine Why:  PLEASE CALL TO ARRANGE Contact information: [address] [place] [address] [phone]        [facility] Follow up.   Why:  [facility] on Thursday for next Dialysis please check INR for COumadin on Saturday at [facility]       [facility] Follow up.   Specialty:  [place] Why:  HHPT/OT arranged- they will call you to set up home visits Contact information: [place] [address] [phone]            The results of significant diagnostics from this hospitalization (including imaging, microbiology, ancillary and laboratory) are listed below for reference.    Significant Diagnostic Studies: Dg Chest 2 View  Result Date: 10/27/2017 CLINICAL DATA:  62 year old female with shortness of breath. Osteomyelitis. Acute renal injury recently started dialysis. EXAM: CHEST   2 VIEW COMPARISON:  10/25/2017 and earlier. FINDINGS: AP and lateral views of the chest. Stable right PICC line and right IJ approach dialysis catheter. Normal cardiac size and mediastinal contours. Diffuse chronic pulmonary interstitial markings are chronic to a degree (seen in 2016, but there is superimposed patchy more confluent biapical pulmonary opacity greater on the right. Chronic linear opacity at the left hilum is stable since 2016. Questionable small pleural effusions. Visualized tracheal air column is within normal limits. No acute osseous abnormality identified. IMPRESSION: 1. Nonspecific acute on chronic right greater than left upper lobe pulmonary opacity. Consider multifocal infection superimposed on chronic lung disease. 2. Questionable small pleural effusions. Electronically Signed   By: Genevie Ann M.D.   On: 10/27/2017 11:06   Ir Fluoro Guide Cv Line Right  Result Date: 10/19/2017 INDICATION: 62 year old female with acute kidney injury in need of hemodialysis. EXAM: IR RIGHT FLOURO GUIDE CV LINE; IR ULTRASOUND GUIDANCE VASC ACCESS RIGHT MEDICATIONS: None. ANESTHESIA/SEDATION: None FLUOROSCOPY TIME:  Fluoroscopy Time: 0 minutes 12 seconds (2 mGy). COMPLICATIONS: None immediate. PROCEDURE: Informed written consent was obtained from the patient after a thorough discussion of the procedural risks, benefits and alternatives. All questions were addressed. Maximal Sterile Barrier Technique was utilized including caps, mask, sterile gowns, sterile gloves, sterile drape, hand hygiene and skin antiseptic. A timeout was performed prior to the initiation of the procedure. The right internal jugular vein was interrogated with ultrasound and found to be widely patent. An image was obtained and stored for the medical record. Local anesthesia was attained by infiltration with 1% lidocaine. A small dermatotomy was made. Under real-time sonographic guidance, the vessel was punctured with a 21 gauge micropuncture  needle. Using standard technique, the initial micro needle was exchanged over a 0.018 micro wire for a transitional 4 [place] micro sheath. The micro sheath was then exchanged over a 0.035  in the skin tract was dilated to 12 [place]. A 20 cm [place] Trialysis catheter was then advanced over the wire and position with the tip in the mid right atrium. The catheter flushes and aspirates with ease. It was flushed with heparinized saline and secured to the skin with 0 Prolene suture. A sterile bandage was applied. IMPRESSION: Successful placement of a 20 cm right IJ approach Trialysis catheter. The catheter tip is in the mid right atrium and ready for immediate use. Electronically Signed   By: Jacqulynn Cadet M.D.   On: 10/19/2017 13:03   Ir US Guide Vasc Access Right  Result Date: 10/19/2017 INDICATION: 62 year old female with acute kidney injury in need of hemodialysis. EXAM: IR RIGHT FLOURO GUIDE CV LINE; IR ULTRASOUND GUIDANCE VASC ACCESS RIGHT MEDICATIONS: None. ANESTHESIA/SEDATION: None FLUOROSCOPY TIME:  Fluoroscopy Time: 0 minutes 12 seconds (2 mGy). COMPLICATIONS: None immediate. PROCEDURE: Informed written consent was obtained from the patient after a thorough discussion of the procedural risks, benefits and alternatives. All questions were addressed. Maximal Sterile Barrier Technique was utilized including caps, mask, sterile gowns, sterile gloves, sterile drape, hand hygiene and skin antiseptic. A timeout was performed prior to the initiation of the procedure. The right internal jugular vein was interrogated with ultrasound and found to be widely patent. An image was obtained and stored for the medical record. Local anesthesia was attained by infiltration with 1% lidocaine. A small dermatotomy was made. Under real-time sonographic guidance, the vessel was punctured with a 21 gauge micropuncture needle. Using standard technique, the initial micro needle was exchanged over a 0.018 micro wire for a  transitional 4 [place] micro sheath. The micro sheath was then exchanged over a 0.035 in the skin tract was dilated to 12 [place]. A 20 cm Mahurkur Trialysis catheter was then advanced over the wire and position with the tip in the mid right atrium. The catheter flushes and aspirates with ease. It was flushed with heparinized saline and secured to the skin with 0 Prolene suture. A sterile bandage was applied. IMPRESSION: Successful placement of a 20 cm right IJ approach Trialysis catheter. The catheter tip is in the mid right atrium and ready for immediate use. Electronically Signed   By: Jacqulynn Cadet M.D.   On: 10/19/2017 13:03   Dg Chest Port 1 View  Result Date: 11/10/2017 CLINICAL DATA:  Placement of a dialysis catheter. EXAM: PORTABLE CHEST 1 VIEW COMPARISON:  Portable chest x-ray of October 29, 2017 FINDINGS: The dialysis catheter tip projects over the midportion of the SVC. There is no postprocedure pneumothorax. There are fluffy interstitial and alveolar airspace opacities bilaterally. The heart is top-normal in size. The pulmonary vascularity is prominent centrally but more distinct than on the earlier study. There is no pleural effusion. There calcification in the wall of the aortic arch. IMPRESSION: No postprocedure complication following dialysis catheter placement. Bilateral interstitial and alveolar opacities consistent with pneumonia or pulmonary edema. Slight improvement since the earlier study. Thoracic aortic atherosclerosis. Electronically Signed   By: David  Martinique M.D.   On: 11/10/2017 12:40   Dg Chest Port 1 View  Result Date: 10/29/2017 CLINICAL DATA:  Shortness of breath and chest pressure since last night EXAM: PORTABLE CHEST 1 VIEW COMPARISON:  10/27/2017, 12/13/2014 FINDINGS: Diffuse bilateral interstitial and alveolar airspace disease. More focal airspace disease in the upper lobes bilaterally, right greater than left. No pleural effusion or pneumothorax. Stable  cardiomediastinal silhouette. No acute osseous abnormality. Large bore right-sided central venous catheter with the tip projecting  over the right atrium. Right-sided PICC line with the tip projecting over the SVC. No acute osseous abnormality. IMPRESSION: 1. Bilateral chronic interstitial lung disease. Superimposed bilateral interstitial and alveolar airspace opacities with more prominent airspace disease in the upper lobes, right greater than left. Differential considerations include interstitial edema versus multifocal pneumonia superimposed upon chronic lung disease. Electronically Signed   By: Kathreen Devoid   On: 10/29/2017 11:09   Dg Chest Port 1 View  Result Date: 10/25/2017 CLINICAL DATA:  Shortness of breath tonight. EXAM: PORTABLE CHEST 1 VIEW COMPARISON:  10/24/2017 FINDINGS: Right central venous catheter with tip over the right atrium. Right PICC line with tip over the low SVC region. No pneumothorax. Shallow inspiration. Cardiac enlargement with pulmonary vascular congestion. Diffuse interstitial and airspace infiltrates in the lungs likely representing edema. More dense consolidation over the right upper lung could represent superimposed pneumonia. Mild progression since previous study. No blunting of costophrenic angles. No pneumothorax. Calcification of the aorta. IMPRESSION: Appliances appear in satisfactory position. Cardiac enlargement with vascular congestion. Diffuse airspace and interstitial infiltration likely represents edema. Possible superimposed consolidation over the right upper lung. Electronically Signed   By: [name]   On: 10/25/2017 02:22   Dg Chest Port 1 View  Result Date: 10/24/2017 CLINICAL DATA:  62 y/o  F; congestive heart failure. EXAM: PORTABLE CHEST 1 VIEW COMPARISON:  10/15/2017 chest radiograph FINDINGS: Stable mild cardiomegaly given projection and technique. Aortic atherosclerosis with calcification. Reticular and mild patchy opacities of lungs  probably representing interstitial and alveolar pulmonary edema. Probable small left effusion. No pneumothorax. Right PICC line tip projects over upper SVC in central venous catheter tip projects over lower SVC. Bones are unremarkable. IMPRESSION: Cardiomegaly. Interstitial and mild alveolar pulmonary edema. Small left effusion. Electronically Signed   By: [name]   On: 10/24/2017 20:30   Dg Fluoro Guide Cv Line-no Report  Result Date: 11/10/2017 Fluoroscopy was utilized by the requesting physician.  No radiographic interpretation.   Vas [place] Burnard Bunting With/wo Tbi  Result Date: 10/29/2017 LOWER EXTREMITY DOPPLER STUDY Indications: Gangrene Left toe, and Known PVD. High Risk Factors: Hypertension, hyperlipidemia. Other Factors: Type II diabetes mellitus. Examination Guidelines: A complete evaluation includes B-mode imaging, spectral doppler, color doppler, and power doppler as needed of all accessible portions of each vessel. Bilateral testing is considered an integral part of a complete examination. Limited examinations for reoccurring indications may be performed as noted.  ABI Findings: +--------+-----------------+-----+----------+----------------------------------+ Right   Rt Pressure      IndexWaveform  Comment                                    (mmHg)                                                             +--------+-----------------+-----+----------+----------------------------------+ Brachial                                Unable to obtain due to PICC  location                           +--------+-----------------+-----+----------+----------------------------------+ PTA     51               0.27 monophasic                                   +--------+-----------------+-----+----------+----------------------------------+ DP      100              0.53 monophasic                                    +--------+-----------------+-----+----------+----------------------------------+ +--------+------------------+-----+----------+-------+ Left    Lt Pressure (mmHg)IndexWaveform  Comment +--------+------------------+-----+----------+-------+ [id number]                    triphasic         +--------+------------------+-----+----------+-------+ PTA     48                0.25 monophasic        +--------+------------------+-----+----------+-------+ DP      84                0.44 monophasic        +--------+------------------+-----+----------+-------+ +-------+-----------+-----------+------------+------------+ ABI/TBIToday's ABIToday's TBIPrevious ABIPrevious TBI +-------+-----------+-----------+------------+------------+ Right  0.53                  0.65                     +-------+-----------+-----------+------------+------------+ Left   0.44                  0.41                     +-------+-----------+-----------+------------+------------+ Right ABIs appear essentially unchanged compared to prior study on 06/29/13. Left ABIs appear essentially unchanged compared to prior study on 06/29/13.  Final Interpretation: Right: Resting right ankle-brachial index indicates moderate, borderline severe, right lower extremity arterial disease. Left: Resting left ankle-brachial index indicates severe left lower extremity arterial disease.  *See table(s) above for measurements and observations.  Electronically signed by Curt Jews on 10/29/2017 at 10:33:34 AM.   [place] Ram [place] Lower Extremity Saphenous Vein Mapping  Result Date: 11/06/2017 LOWER EXTREMITY VEIN MAPPING Indications: Pre-op, and PAD Examination Guidelines: A complete evaluation includes B-mode imaging, spectral doppler, color doppler, and power doppler as needed of all accessible portions of each vessel. Bilateral testing is considered an integral part of a complete examination. Limited examinations for reoccurring indications may be  performed as noted. No prior exam on file for comparison. +--------------+---------------+--------------------+--------------+-----------+  RT Diameter    RT Findings          GSV          LT Diameter  LT Findings      (mm)                                             (mm)                 +--------------+---------------+--------------------+--------------+-----------+      6.80  Saphenofemoral        7.60                                                    Junction                                +--------------+---------------+--------------------+--------------+-----------+      6.50                       Proximal thigh        6.50                 +--------------+---------------+--------------------+--------------+-----------+      6.20                         Mid thigh           6.40                 +--------------+---------------+--------------------+--------------+-----------+      6.80                        Distal thigh         5.80      branching  +--------------+---------------+--------------------+--------------+-----------+      6.10                            Knee             6.30                 +--------------+---------------+--------------------+--------------+-----------+      5.00                         Prox calf           5.40      branching  +--------------+---------------+--------------------+--------------+-----------+      3.60      branching and       Mid calf           3.40                                  tortuous                                                  +--------------+---------------+--------------------+--------------+-----------+  Servando Snare  Electronically signed by Servando Snare on 11/06/2017 at 2:00:37 PM.   --------------------------------------------------------------------------------    Vas [place] Lower Extremity Arterial Duplex  Result Date: 10/29/2017 LOWER EXTREMITY ARTERIAL DUPLEX STUDY  Indications: Ulceration Left toe. High Risk Factors: Hypertension, hyperlipidemia. Other Factors: Known PVD. Type II diabetes mellitus.  Current ABI: Right 0.53, left 0.44  Examination Guidelines: A complete evaluation includes B-mode imaging, spectral doppler, color doppler, and power doppler as needed of all accessible portions of each vessel. Bilateral testing is considered an integral part of a complete examination. Limited examinations for reoccurring indications may be performed as noted.  Right Duplex Findings: +-----------+--------+-----+--------+----------+--------+  PSV cm/sRatioStenosisWaveform  Comments +-----------+--------+-----+--------+----------+--------+ EIA Distal 142                  triphasic          +-----------+--------+-----+--------+----------+--------+ CFA Mid    125                  monophasic         +-----------+--------+-----+--------+----------+--------+ SFA Prox   244                  monophasic         +-----------+--------+-----+--------+----------+--------+ SFA Mid                 occluded                   +-----------+--------+-----+--------+----------+--------+ SFA Distal 78                                      +-----------+--------+-----+--------+----------+--------+ POP Prox   48                   monophasic         +-----------+--------+-----+--------+----------+--------+ POP Distal 44                   monophasic         +-----------+--------+-----+--------+----------+--------+ ATA Mid    20                   monophasic         +-----------+--------+-----+--------+----------+--------+ ATA Distal 47                   monophasic         +-----------+--------+-----+--------+----------+--------+ PTA Prox                occluded                   +-----------+--------+-----+--------+----------+--------+ PTA Mid                 occluded                    +-----------+--------+-----+--------+----------+--------+ PTA Distal              occluded                   +-----------+--------+-----+--------+----------+--------+ PERO Prox               occluded                   +-----------+--------+-----+--------+----------+--------+ PERO Mid                occluded                   +-----------+--------+-----+--------+----------+--------+ PERO Distal             occluded                   +-----------+--------+-----+--------+----------+--------+ DP         20                   monophasic         +-----------+--------+-----+--------+----------+--------+  Left Duplex Findings: +-----------+--------+-----+--------+----------+-------------------+            PSV cm/sRatioStenosisWaveform  Comments            +-----------+--------+-----+--------+----------+-------------------+ EIA Distal 169  biphasic                      +-----------+--------+-----+--------+----------+-------------------+ CFA Mid    156                  biphasic                      +-----------+--------+-----+--------+----------+-------------------+ DFA        180                  monophasic                    +-----------+--------+-----+--------+----------+-------------------+ SFA Prox   212                  monophasic                    +-----------+--------+-----+--------+----------+-------------------+ SFA Mid    128                  monophasic                    +-----------+--------+-----+--------+----------+-------------------+ SFA Distal 48                   monophasic                    +-----------+--------+-----+--------+----------+-------------------+ POP Prox   76                   monophasic                    +-----------+--------+-----+--------+----------+-------------------+ POP Distal 78                   monophasic                     +-----------+--------+-----+--------+----------+-------------------+ ATA Prox   88                   monophasic                    +-----------+--------+-----+--------+----------+-------------------+ ATA Mid    86                   monophasic                    +-----------+--------+-----+--------+----------+-------------------+ ATA Distal 87                   monophasic                    +-----------+--------+-----+--------+----------+-------------------+ PTA Prox   57                   monophasic                    +-----------+--------+-----+--------+----------+-------------------+ PTA Mid                 occluded                              +-----------+--------+-----+--------+----------+-------------------+ PTA Distal              occluded                              +-----------+--------+-----+--------+----------+-------------------+  PERO Prox                                 Unable to visualize +-----------+--------+-----+--------+----------+-------------------+ PERO Mid                                  Unable to visualize +-----------+--------+-----+--------+----------+-------------------+ PERO Distal                               Unable to visualize +-----------+--------+-----+--------+----------+-------------------+ DP         56                   monophasic                    +-----------+--------+-----+--------+----------+-------------------+  Final Interpretation:  Right: 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 Occlusion noted in the mid        superficial femoral artery with collateralization and        reconstitution at the distal superficial femoral artery.        Occlusion is also noted in the posterior tibial artery and        peroneal artery.  Left: Occlusion noted in the posterior tibial artery. *See table(s) above for measurements and observations.  Electronically signed by  Curt Jews on 10/29/2017 at 10:33:10 AM.   [facility] [place] Lower Extremity Venous (dvt)  Result Date: 10/29/2017  Lower Venous Study Indication: DVT seen on lower extremity arterial duplex. Examination Guidelines: A complete evaluation includes B-mode imaging, spectral doppler, color doppler, and power doppler as needed of all accessible portions of each vessel. Bilateral testing is considered an integral part of a complete examination. Limited examinations for reoccurring indications may be performed as noted.  Right Venous Findings: +---------+---------------+---------+-----------+----------+-------+          CompressibilityPhasicitySpontaneityPropertiesSummary +---------+---------------+---------+-----------+----------+-------+ CFV      Full           Yes      Yes                          +---------+---------------+---------+-----------+----------+-------+ FV Prox  Full                                                 +---------+---------------+---------+-----------+----------+-------+ FV Mid   Full                                                 +---------+---------------+---------+-----------+----------+-------+ FV DistalFull                                                 +---------+---------------+---------+-----------+----------+-------+ POP      Full           Yes      Yes                          +---------+---------------+---------+-----------+----------+-------+  PTV      None                    Yes                  Acute   +---------+---------------+---------+-----------+----------+-------+ PERO     None                    No                   Acute   +---------+---------------+---------+-----------+----------+-------+  Left Venous Findings: +---------+---------------+---------+-----------+----------+-------+          CompressibilityPhasicitySpontaneityPropertiesSummary +---------+---------------+---------+-----------+----------+-------+ CFV       Full           Yes      Yes                          +---------+---------------+---------+-----------+----------+-------+ FV Prox  Full                                                 +---------+---------------+---------+-----------+----------+-------+ FV Mid   Full                                                 +---------+---------------+---------+-----------+----------+-------+ FV DistalNone                                         Acute   +---------+---------------+---------+-----------+----------+-------+ POP      Full           Yes      Yes                          +---------+---------------+---------+-----------+----------+-------+ PTV      None                                         Acute   +---------+---------------+---------+-----------+----------+-------+    Final Interpretation: Right: There is evidence of acute DVT in the Posterior Tibial veins, and Peroneal veins. No cystic structure found in the popliteal fossa. Left: There is evidence of acute DVT in the Femoral vein, and Posterior Tibial veins. No cystic structure found in the popliteal fossa.  *See table(s) above for measurements and observations. Electronically signed by Curt Jews on 10/29/2017 at 10:28:00 AM.   Vas [place] Upper Ext Vein Mapping (pre-op Avf)  Result Date: 11/03/2017 UPPER EXTREMITY VEIN MAPPING History: Pre AVF. Examination Guidelines: A complete evaluation includes B-mode imaging, spectral doppler, color doppler, and power doppler as needed of all accessible portions of each vessel. Bilateral testing is considered an integral part of a complete examination. Limited examinations for reoccurring indications may be performed as noted. +-----------------+-------------+----------+--------+ Right Cephalic   Diameter (mm)Depth (mm)Findings +-----------------+-------------+----------+--------+ Shoulder             3.90       20.00             +-----------------+-------------+----------+--------+ Prox upper arm  2.50       16.00            +-----------------+-------------+----------+--------+ Mid upper arm        2.50       13.00            +-----------------+-------------+----------+--------+ Dist upper arm       2.60        4.80            +-----------------+-------------+----------+--------+ Antecubital fossa    2.90        3.00            +-----------------+-------------+----------+--------+ Prox forearm         3.20        6.80            +-----------------+-------------+----------+--------+ Mid forearm          2.90        8.00            +-----------------+-------------+----------+--------+ Dist forearm         2.60        9.00            +-----------------+-------------+----------+--------+ Wrist                1.30        2.80            +-----------------+-------------+----------+--------+ +-----------------+-------------+----------+---------+ Left Cephalic    Diameter (mm)Depth (mm)Findings  +-----------------+-------------+----------+---------+ Shoulder             4.70       19.00             +-----------------+-------------+----------+---------+ Prox upper arm       4.90       18.00             +-----------------+-------------+----------+---------+ Mid upper arm        6.60       13.00   branching +-----------------+-------------+----------+---------+ Dist upper arm       5.60       10.00   branching +-----------------+-------------+----------+---------+ Antecubital fossa    5.40        2.60             +-----------------+-------------+----------+---------+ Prox forearm         3.80        8.60   branching +-----------------+-------------+----------+---------+ Mid forearm          4.30       12.00             +-----------------+-------------+----------+---------+ Dist forearm         3.70        3.00              +-----------------+-------------+----------+---------+ Wrist                1.90        4.10             +-----------------+-------------+----------+---------+ Final Interpretation: *See table(s) above for measurements and observations.  Deitra Mayo Electronically signed by Deitra Mayo on 11/03/2017 at 2:52:07 PM.    Microbiology: No results found for this or any previous visit (from the past 240 hour(s)).   Labs: Basic Metabolic Panel: Recent Labs  Lab 11/10/17 0423 11/10/17 1602 11/11/17 0200 11/11/17 0740 11/12/17 0930 11/15/17 0800  NA 136 134* 134*  --  133* 133*  K 3.8 4.6 4.5  --  3.2* 3.1*  CL 102 102 102  --  97* 97*  CO2 22 22 20*  --  25 23  GLUCOSE 108* 249* 206*  --  138* 127*  BUN 17 20 22*  --  18 30*  CREATININE 3.06* 3.17* 3.11*  --  2.40* 2.99*  CALCIUM 8.0* 8.5* 8.7* 8.3* 8.0* 8.3*  PHOS  --  4.2  --   --  2.7 2.4*   Liver Function Tests: Recent Labs  Lab 11/10/17 1602 11/12/17 0930 11/15/17 0800  ALBUMIN 2.8* 2.5* 2.6*   No results for input(s): LIPASE, AMYLASE in the last 168 hours. No results for input(s): AMMONIA in the last 168 hours. CBC: Recent Labs  Lab 11/11/17 0200 11/12/17 0428 11/13/17 0314 11/14/17 0432 11/15/17 0700  WBC 7.8 8.3 5.6 6.7 9.2  HGB 8.2* 7.7* 8.4* 8.1* 9.1*  HCT 27.6* 26.3* 28.3* 27.6* 30.0*  MCV 93.9 94.3 95.0 94.5 94.3  PLT 342 319 292 271 305   Cardiac Enzymes: Recent Labs  Lab 11/10/17 0423  CKTOTAL 9*   BNP: BNP (last 3 results) Recent Labs    10/17/17 1911  BNP 2,204.7*    ProBNP (last 3 results) No results for input(s): PROBNP in the last 8760 hours.  CBG: Recent Labs  Lab 11/10/17 1221  GLUCAP 105*       Signed:  Domenic Polite MD.  [facility] 11/16/2017, 3:45 PM

## 2017-11-16 NOTE — Progress Notes (Signed)
Nutrition Education Note  RD consulted for Renal Education. Provided "Dietary Guidelines for Patients Starting Dialysis" as well as "Grocery List"  to patient/family. Reviewed food groups and provided written recommended serving sizes specifically determined for patient's current nutritional status.   Explained why diet restrictions are needed and provided lists of foods to limit/avoid that are high potassium, sodium, and phosphorus. Provided specific recommendations on safer alternatives of these foods. Strongly encouraged compliance of this diet.   Reviewed current phosphorus and potassium levels with patient. Current values low. Reinforced importance of taking renal MVI. Reviewed importance of following up with outpatient RD.   Discussed importance of protein intake at each meal and snack. Provided examples of how to maximize protein intake throughout the day. Discussed need for fluid restriction with dialysis, importance of minimizing weight gain between HD treatments, and renal-friendly beverage options.  Encouraged pt to discuss specific diet questions/concerns with RD at HD outpatient facility. Teach back method used.  Expect good compliance.  Body mass index is 32.86 kg/m. Pt meets criteria for obesity unspecified based on current BMI.  Pt being discharged to home today.   Kerman Passey MS, RD, [place], [place] [phone] Pager  [phone] Weekend/On-Call Pager

## 2017-11-16 NOTE — Progress Notes (Addendum)
Patient sleeping and had 3 beats of 2 Degree Type 2. Awoken patient and she had no complaints and immed. went back into S.R. 70's. Skin warm and dry resp. Even and unlab. Cont. To monitor patient and rhythm. Charge R.N. Aware.

## 2017-11-16 NOTE — Progress Notes (Signed)
   Left arm avf with palpable thrill and hand is neurovascular in tact. Left 2nd toe amp site healing well and AT is palpable. F/u in office on 3.1. 19.   Doneisha Ivey C. Donzetta Matters, MD [facility] Office: [phone] Pager: [phone]

## 2017-11-17 ENCOUNTER — Encounter (HOSPITAL_COMMUNITY): Payer: Self-pay | Admitting: Emergency Medicine

## 2017-11-17 ENCOUNTER — Other Ambulatory Visit: Payer: Self-pay

## 2017-11-17 ENCOUNTER — Emergency Department (HOSPITAL_COMMUNITY): Payer: Medicare HMO

## 2017-11-17 DIAGNOSIS — Z79899 Other long term (current) drug therapy: Secondary | ICD-10-CM | POA: Insufficient documentation

## 2017-11-17 DIAGNOSIS — N184 Chronic kidney disease, stage 4 (severe): Secondary | ICD-10-CM | POA: Diagnosis not present

## 2017-11-17 DIAGNOSIS — R531 Weakness: Secondary | ICD-10-CM | POA: Insufficient documentation

## 2017-11-17 DIAGNOSIS — Z95828 Presence of other vascular implants and grafts: Secondary | ICD-10-CM | POA: Insufficient documentation

## 2017-11-17 DIAGNOSIS — L299 Pruritus, unspecified: Secondary | ICD-10-CM | POA: Insufficient documentation

## 2017-11-17 DIAGNOSIS — E1122 Type 2 diabetes mellitus with diabetic chronic kidney disease: Secondary | ICD-10-CM | POA: Diagnosis not present

## 2017-11-17 DIAGNOSIS — E1140 Type 2 diabetes mellitus with diabetic neuropathy, unspecified: Secondary | ICD-10-CM | POA: Insufficient documentation

## 2017-11-17 DIAGNOSIS — I5022 Chronic systolic (congestive) heart failure: Secondary | ICD-10-CM | POA: Diagnosis not present

## 2017-11-17 DIAGNOSIS — I1310 Hypertensive heart and chronic kidney disease without heart failure, with stage 1 through stage 4 chronic kidney disease, or unspecified chronic kidney disease: Secondary | ICD-10-CM | POA: Insufficient documentation

## 2017-11-17 DIAGNOSIS — I739 Peripheral vascular disease, unspecified: Secondary | ICD-10-CM | POA: Insufficient documentation

## 2017-11-17 DIAGNOSIS — I130 Hypertensive heart and chronic kidney disease with heart failure and stage 1 through stage 4 chronic kidney disease, or unspecified chronic kidney disease: Secondary | ICD-10-CM | POA: Insufficient documentation

## 2017-11-17 DIAGNOSIS — R52 Pain, unspecified: Secondary | ICD-10-CM | POA: Insufficient documentation

## 2017-11-17 DIAGNOSIS — E7800 Pure hypercholesterolemia, unspecified: Secondary | ICD-10-CM | POA: Diagnosis not present

## 2017-11-17 DIAGNOSIS — Z7902 Long term (current) use of antithrombotics/antiplatelets: Secondary | ICD-10-CM | POA: Insufficient documentation

## 2017-11-17 DIAGNOSIS — D689 Coagulation defect, unspecified: Secondary | ICD-10-CM | POA: Insufficient documentation

## 2017-11-17 DIAGNOSIS — Z7901 Long term (current) use of anticoagulants: Secondary | ICD-10-CM | POA: Insufficient documentation

## 2017-11-17 DIAGNOSIS — I2510 Atherosclerotic heart disease of native coronary artery without angina pectoris: Secondary | ICD-10-CM | POA: Insufficient documentation

## 2017-11-17 DIAGNOSIS — E162 Hypoglycemia, unspecified: Secondary | ICD-10-CM | POA: Insufficient documentation

## 2017-11-17 DIAGNOSIS — Z86718 Personal history of other venous thrombosis and embolism: Secondary | ICD-10-CM | POA: Insufficient documentation

## 2017-11-17 DIAGNOSIS — N2581 Secondary hyperparathyroidism of renal origin: Secondary | ICD-10-CM | POA: Insufficient documentation

## 2017-11-17 LAB — COMPREHENSIVE METABOLIC PANEL
ALT: 13 U/L — ABNORMAL LOW (ref 14–54)
AST: 28 U/L (ref 15–41)
Albumin: 3.2 g/dL — ABNORMAL LOW (ref 3.5–5.0)
Alkaline Phosphatase: 84 U/L (ref 38–126)
Anion gap: 11 (ref 5–15)
BUN: 6 mg/dL (ref 6–20)
CO2: 26 mmol/L (ref 22–32)
Calcium: 8.1 mg/dL — ABNORMAL LOW (ref 8.9–10.3)
Chloride: 100 mmol/L — ABNORMAL LOW (ref 101–111)
Creatinine, Ser: 1.3 mg/dL — ABNORMAL HIGH (ref 0.44–1.00)
GFR calc Af Amer: 50 mL/min — ABNORMAL LOW (ref >60)
GFR calc non Af Amer: 43 mL/min — ABNORMAL LOW (ref >60)
Glucose, Bld: 148 mg/dL — ABNORMAL HIGH (ref 65–99)
Potassium: 3.5 mmol/L (ref 3.5–5.1)
Sodium: 137 mmol/L (ref 135–145)
Total Bilirubin: 0.8 mg/dL (ref 0.3–1.2)
Total Protein: 6.7 g/dL (ref 6.5–8.1)

## 2017-11-17 LAB — CBC
HCT: 34.3 % — ABNORMAL LOW (ref 36.0–46.0)
Hemoglobin: 10.3 g/dL — ABNORMAL LOW (ref 12.0–15.0)
MCH: 28.9 pg (ref 26.0–34.0)
MCHC: 30 g/dL (ref 30.0–36.0)
MCV: 96.1 fL (ref 78.0–100.0)
Platelets: 284 10*3/uL (ref 150–400)
RBC: 3.57 MIL/uL — ABNORMAL LOW (ref 3.87–5.11)
RDW: 22.8 % — ABNORMAL HIGH (ref 11.5–15.5)
WBC: 9.8 10*3/uL (ref 4.0–10.5)

## 2017-11-17 LAB — BRAIN NATRIURETIC PEPTIDE: B Natriuretic Peptide: 1206.8 pg/mL — ABNORMAL HIGH (ref 0.0–100.0)

## 2017-11-17 LAB — I-STAT TROPONIN, ED: Troponin i, poc: 0 ng/mL (ref 0.00–0.08)

## 2017-11-17 NOTE — ED Triage Notes (Addendum)
Patient states that she was here for 37 days and went home two days ago.  She is now having weakness, she states that she has no strength to get out of bed or get off the commode.  She gets winded with any activity.  Patient with CHF and she is a dialysis patient, [facility].  She had her dialysis today.

## 2017-11-18 ENCOUNTER — Emergency Department (HOSPITAL_COMMUNITY)
Admission: EM | Admit: 2017-11-18 | Discharge: 2017-11-18 | Disposition: A | Discharge status: Home / Self Care | Payer: Medicare HMO | Attending: Emergency Medicine | Admitting: Emergency Medicine

## 2017-11-18 DIAGNOSIS — R531 Weakness: Secondary | ICD-10-CM

## 2017-11-18 NOTE — Progress Notes (Signed)
CSW informed that pt is in need of [facility] services. CSW has reached ou tot RNCM covering to have services set up. CSW signing off at this time.     Virgie Dad Aquil Duhe, MSW, [facility] Clinical Social Worker [phone]

## 2017-11-18 NOTE — ED Notes (Signed)
Family members notified and coming to get pt.

## 2017-11-18 NOTE — Progress Notes (Signed)
CSW informed by RN that pt's brother is asking for an FL2. CSW explained to RN that pt can go see PCP and obtain an FL2 as requested as CSW is not allowed to complete  FL2's just to hand out to pt and family. RN expressed understanding and mentioned that she would relay the message to pt and family. CSW signing off.    Kristen Berry, MSW, [place] [facility] Clinical Social Worker [phone]

## 2017-11-18 NOTE — Discharge Instructions (Signed)
Social work consult obtained.  Stable for discharge.

## 2017-11-18 NOTE — ED Notes (Signed)
Contact CSW and case manager about pt referral for home health care.

## 2017-11-18 NOTE — ED Notes (Signed)
Pt ambulated to nurses station with walker. Pt has steady gait and no c/o while walking.

## 2017-11-18 NOTE — Progress Notes (Addendum)
CSW called [facility] for transport. Pt expressed wanting to go to mothers house [name]. Number for pt's mother is [phone]. CSW explained to pt that pt may receive a bill in the mail for transport and pt expressed understanding and agreeing.  Pt asked that if any family calls looking for pt -inform them "That it is already dealt with". CSW has updated RN and pt at bedside. CSW signing off at this time.     Virgie Dad Kristen Berry, MSW, [place] [facility] Clinical Social Worker [phone]

## 2017-11-18 NOTE — ED Notes (Signed)
Pt given diet sprite 

## 2017-11-18 NOTE — Progress Notes (Signed)
CSW spoke with pt and RN at bedside. CSW was informed that pt is already set up with [facility] at this time. CSW was informed by [facility] that more services have been added to pt's [facility] services at this time. CSW spoke with pt's daughter ([name]) and was informed that her brother [name] would be picking pt up from the hospital. CSW was asked to send additional information to [facility] for placement on pt. CSW explained that CSW is not allowed to do that without pt's consent and pt is able to make own decisions and asked that CSW not send information to facility. Pt expressed wanting to go home where pt already has [facility] services set up. CSW will assists with discharge as needed.     Virgie Dad Ziza Hastings, MSW, [place] [facility] Clinical Social Worker [phone]

## 2017-11-18 NOTE — ED Provider Notes (Signed)
[facility] EMERGENCY DEPARTMENT Provider Note   CSN: [id number] Arrival date & time: 11/17/17  2005     History   Chief Complaint Chief Complaint  Patient presents with  . Weakness    HPI Kristen Berry is a 62 y.o. female.  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  She was in the hospital for greater than 30 days.  At time of discharge she was seen by physical therapy and occupational therapy and it was recommended that she return home with home health resources.  She states she is feels increasingly weak at home and has been too weak to get up off the commode.  She feels as though she is requiring too much assistance at home and is requesting placement into a rehabilitation facility.  She is currently being seen by [facility] and states that a representative from the home health agency was out there yesterday.  She does not have a bedside commode.  She does have a shower chair.  She states her ambulation has become worse over the past 24-48 hours since her time of discharge.   Past Medical History:  Diagnosis Date  . Arthritis    "hands, knees" (10/10/2017)  . CHF (congestive heart failure) ([place])   . Chronic kidney disease (CKD), stage IV (severe) ([place])   . High cholesterol   . History of kidney stones   . Hypertension   . PVD (peripheral vascular disease) ([place])    "LLE; will have OR" (10/10/2017)  . Sleep apnea    "never given mask" (10/10/2017)  . Type II diabetes mellitus ([place])    "no RX anymore" (10/10/2017)    Patient Active Problem List   Diagnosis Date Noted  . Pressure injury of skin 11/08/2017  . Coronary artery disease due to lipid rich plaque   . Chest pain   . Acute on chronic systolic CHF (congestive heart failure), NYHA class 3 ([place])  10/18/2017  . PICC (peripherally inserted central catheter) in place   . Acute osteomyelitis of toe of left foot ([place])   . ARF (acute renal failure) ([place]) 10/10/2017  . AKI (acute kidney injury) ([place]) 10/10/2017  . Essential hypertension 12/14/2016  . Abnormal bone xray 06/15/2016  . MGUS (monoclonal gammopathy of unknown significance) 05/18/2016  . Stage 4 chronic kidney disease ([place]) 05/18/2016  . CHF (congestive heart failure) ([place]) 05/18/2016  . Renal insufficiency 06/28/2013  . Other and unspecified hyperlipidemia 06/28/2013  . Obesity, unspecified 06/28/2013  . Pain in limb 06/28/2013  . Type 2 diabetes mellitus with neurologic complication, without long-term current use of insulin ([place]) 06/28/2013    Past Surgical History:  Procedure Laterality Date  . AMPUTATION Left 11/10/2017   Procedure: AMPUTATION DIGIT SECOND TOE LEFT FOOT;  Surgeon: Waynetta Sandy, MD;  Location: [facility];  Service: Vascular;  Laterality: Left;  . AORTOGRAM N/A 11/03/2017   Procedure: Ultrasound Guided Cannulation Right Common Femoral Artery;  Aortagram with Left Lower Extremity Arteriogram; Attempted Treatment Left Superficial Femoral Artery; Percutaneous Closure Right Common Femoral Arteriotomy with Proglide Device;  Surgeon: Waynetta Sandy, MD;  Location: [facility] OR;  Service: Vascular;  Laterality: N/A;  . AV FISTULA PLACEMENT Left 11/10/2017   Procedure: ARTERIOVENOUS (AV) FISTULA CREATION LEFT UPPER ARM;  Surgeon: Waynetta Sandy, MD;  [facility] OR;  Service: Vascular;  Laterality: Left;  . HERNIA REPAIR  1950s  . INSERTION OF DIALYSIS CATHETER Right 11/10/2017   Procedure: INSERTION OF TUNNELED DIALYSIS CATHETER RIGHT INTERNAL JUGULAR PLACEMENT;  Surgeon: Waynetta Sandy, MD;  Location: [facility];  Service: Vascular;  Laterality: Right;  . IR FLUORO GUIDE CV LINE RIGHT  10/19/2017  . IR US GUIDE VASC ACCESS RIGHT  10/19/2017  . LEFT HEART CATH AND CORONARY ANGIOGRAPHY N/A  11/01/2017   Procedure: LEFT HEART CATH AND CORONARY ANGIOGRAPHY;  Surgeon: Belva Crome, MD;  Location: [facility];  Service: Cardiovascular;  Laterality: N/A;  . PERIPHERAL VASCULAR INTERVENTION Left 11/07/2017   Procedure: PERIPHERAL VASCULAR INTERVENTION;  Surgeon: Waynetta Sandy, MD;  Location: [facility];  Service: Cardiovascular;  Laterality: Left;  left SFA  . SHOULDER OPEN ROTATOR CUFF REPAIR Left   . TUBAL LIGATION    . ULTRASOUND GUIDANCE FOR VASCULAR ACCESS  11/01/2017   Procedure: Ultrasound Guidance For Vascular Access;  Surgeon: Belva Crome, MD;  Location: [facility];  Service: Cardiovascular;;    OB History    No data available       Home Medications    Prior to Admission medications   Medication Sig Start Date End Date Taking? Authorizing Provider  acetaminophen (TYLENOL) 500 MG tablet Take 1,500 mg by mouth at bedtime as needed for headache (pain).   Yes [provider]  albuterol (PROAIR HFA) 108 (90 Base) MCG/ACT inhaler Inhale 2 puffs into the lungs every 6 (six) hours as needed for wheezing or shortness of breath.   Yes [provider]  atorvastatin (LIPITOR) 20 MG tablet Take 1 tablet (20 mg total) by mouth daily at 6 PM. 11/16/17  Yes Domenic Polite, MD  carvedilol (COREG) 12.5 MG tablet Take 1 tablet (12.5 mg total) by mouth 2 (two) times daily with a meal. 11/16/17  Yes Domenic Polite, MD  clopidogrel (PLAVIX) 75 MG tablet Take 1 tablet (75 mg total) by mouth daily with breakfast. 11/17/17  Yes Domenic Polite, MD  gabapentin (NEURONTIN) 100 MG capsule Take 100 mg by mouth at bedtime as needed (nerve pain).    Yes [provider]  hydrALAZINE (APRESOLINE) 25 MG tablet Take 1 tablet (25 mg total) by mouth 2 (two) times daily. 11/16/17  Yes Domenic Polite, MD  pantoprazole (PROTONIX) 40 MG tablet Take 1 tablet (40 mg total) by mouth daily. 11/17/17  Yes Domenic Polite, MD  warfarin (COUMADIN) 5 MG tablet  Take 1 tablet (5 mg total) by mouth daily. Check INR at [facility] on Saturday 11/16/17  Yes Domenic Polite, MD    Family History Family History  Problem Relation Age of Onset  . Hypertension Father   . Heart failure Maternal Grandmother   . Heart failure Maternal Grandfather     Social History Social History   Tobacco Use  . Smoking status: Never Smoker  . Smokeless tobacco: Never Used  Substance Use Topics  . Alcohol use: No  . Drug use: No     Allergies   Crestor [rosuvastatin calcium]   Review of Systems Review of Systems  All other systems reviewed and are negative.    Physical Exam Updated Vital Signs BP (!) 115/95   Pulse 89   Temp 97.7 F (36.5 C)   Resp (!) 23   SpO2 100%   Physical Exam  Constitutional: She is oriented to person, place, and time. She appears well-developed and well-nourished.  HENT:  Head: Normocephalic.  Eyes: EOM are normal.  Neck: Normal range of motion.  Cardiovascular: Normal rate and regular rhythm.  Pulmonary/Chest: Effort normal and breath sounds normal.  Abdominal: Soft. She exhibits no distension.  Musculoskeletal: Normal range of motion. She exhibits no edema.  Neurological: She is alert and oriented to person, place, and time.  Moves all 4 extremities equally.  Good strength.  Psychiatric: She has a normal mood and affect.  Nursing note and vitals reviewed.    ED Treatments / Results  Labs (all labs ordered are listed, but only abnormal results are displayed) Labs Reviewed  CBC - Abnormal; Notable for the following components:      Result Value   RBC 3.57 (*)    Hemoglobin 10.3 (*)    HCT 34.3 (*)    RDW 22.8 (*)    All other components within normal limits  COMPREHENSIVE METABOLIC PANEL - Abnormal; Notable for the following components:   Chloride 100 (*)    Glucose, Bld 148 (*)    Creatinine, Ser 1.30 (*)    Calcium 8.1 (*)    Albumin 3.2 (*)    ALT 13 (*)    GFR calc non Af Amer 43 (*)    GFR  calc Af Amer 50 (*)    All other components within normal limits  BRAIN NATRIURETIC PEPTIDE - Abnormal; Notable for the following components:   B Natriuretic Peptide 1,206.8 (*)    All other components within normal limits  I-STAT TROPONIN, ED    EKG  EKG Interpretation  Date/Time:  Thursday November 17 2017 20:10:45 EST Ventricular Rate:  87 PR Interval:  174 QRS Duration: 86 QT Interval:  400 QTC Calculation: 481 R Axis:   18 Text Interpretation:  Normal sinus rhythm ST & T wave abnormality, consider lateral ischemia Abnormal ECG No significant change was found Confirmed by Jola Schmidt [id number]) on 11/18/2017 2:29:11 AM       Radiology Dg Chest 2 View  Result Date: 11/17/2017 CLINICAL DATA:  Shortness of breath and fatigue EXAM: CHEST  2 VIEW COMPARISON:  11/10/2017, 10/29/2017 FINDINGS: Right-sided central venous catheter tip overlies the SVC. Mild cardiomegaly with tiny pleural effusion. Decreased pulmonary edema with mild residual. Aortic atherosclerosis. No pneumothorax. More focal opacity at the right apex could reflect a small focus of infiltrate IMPRESSION: 1. Decreased interstitial and alveolar opacity since the previous exam 2. Cardiomegaly with tiny pleural effusions and mild residual interstitial edema. Small focus of probable infiltrate at the right lung apex. Electronically Signed   By: Donavan Foil M.D.   On: 11/17/2017 20:48    Procedures Procedures (including critical care time)  Medications Ordered in ED Medications - No data to display   Initial Impression / Assessment and Plan / ED Course  I have reviewed the triage vital signs and the nursing notes.  Pertinent labs & imaging results that were available during my care of the patient were reviewed by me and considered in my medical decision making (see chart for details).       Overall the patient is well-appearing.  She has no shortness of breath.  She will benefit from case management consultation in  the morning.  I do not see a medical reason for admission at this time.  She is likely continue to be decompensated from her recent prolonged hospitalization.  She may need physical therapy repeat evaluation in the emergency department and possible disposition to a rehab facility.  Appreciate the assistance in  advance of case management involvement to help with disposition planning.  Patient and family understand that she may return home with ongoing home health needs.  Final Clinical Impressions(s) / ED Diagnoses   Final diagnoses:  None    ED Discharge Orders    None       Jola Schmidt, MD 11/18/17 [phone]

## 2017-11-18 NOTE — Care Management Note (Signed)
Case Management Note  Patient Details  Name: Kristen Berry MRN: [id number] Date of Birth: [birth year]  CM consulted for [facility].  CM noted pt was referred to [facility] on 11/16/2017 with PT OT.  Contacted Michelle with [facility] to advised of new [facility] orders RN PT NA and SW.  She advised they do not have SW but their [facility] RN's can assist with placement if that is what is being requested.  Updated CSW and Dr. Lacinda Axon.  No further CM needs noted at this time.  Expected Discharge Date:   11/18/2017               Expected Discharge Plan:  [place]  In-House Referral:  Clinical Social Work  Discharge planning Services  CM Consult  Post Acute Care Choice:  Home Health Choice offered to:  Patient  HH Arranged:  RN, PT, OT, Nurse's Aide [place] Agency:  [facility]  Status of Service:  Completed, signed off  Rae Mar, RN 11/18/2017, 10:30 AM

## 2017-11-18 NOTE — ED Notes (Addendum)
SW called pt daughter at home, pt daughter requesting pt be sent to rehab facility. It has been explained to family on several occasions that pt is A+Ox4 and is her own POA. Pt denies request for rehab. Pt states she has home health and that she is comfortable going home with that in place, states her only difficulty at home is getting on and off her commode, states it is "too low". Family made aware, family continues to state they are "working on getting her a ride". They have been "working on it" x5 hours. Unable to contact pt son, attempted to call x4, voicemail not set up. [facility] called for transport.

## 2017-11-18 NOTE — ED Notes (Signed)
Pt daughter called this RN, requested we have forms transferred to another facility as family would like pt transferred to rehab. Previous RN has already explained to family that we do not transfer pts from ED to rehab. The home health agency that is already set up with pt can help find placement at a rehab if that is what the pt wants. Pt is A+Ox4. Pt daughter stated she would be finding an attorney as they are not happy with this information and that they would be coming to pick her up.

## 2017-11-18 NOTE — ED Provider Notes (Signed)
Social work consult obtained.  Patient stable for discharge.   Nat Christen, MD 11/18/17 [id number]

## 2017-11-21 DIAGNOSIS — D509 Iron deficiency anemia, unspecified: Secondary | ICD-10-CM | POA: Insufficient documentation

## 2017-11-22 DIAGNOSIS — Z23 Encounter for immunization: Secondary | ICD-10-CM | POA: Insufficient documentation

## 2017-11-25 ENCOUNTER — Encounter: Payer: Self-pay | Admitting: Vascular Surgery

## 2017-11-25 ENCOUNTER — Ambulatory Visit (INDEPENDENT_AMBULATORY_CARE_PROVIDER_SITE_OTHER): Payer: Self-pay | Admitting: Vascular Surgery

## 2017-11-25 ENCOUNTER — Other Ambulatory Visit: Payer: Self-pay

## 2017-11-25 VITALS — BP 139/66 | HR 81 | Temp 96.7°F | Resp 16 | Ht 68.0 in | Wt 202.0 lb

## 2017-11-25 DIAGNOSIS — I739 Peripheral vascular disease, unspecified: Secondary | ICD-10-CM

## 2017-11-25 DIAGNOSIS — N186 End stage renal disease: Secondary | ICD-10-CM

## 2017-11-25 DIAGNOSIS — Z992 Dependence on renal dialysis: Secondary | ICD-10-CM

## 2017-11-25 NOTE — Progress Notes (Signed)
Subjective:     Patient ID: Kristen Berry, female   DOB: 1956-04-30, 62 y.o.   MRN: [id number]  HPI Kristen Berry returns for follow-up of recent hospitalization where she was initiated on dialysis with right IJ catheter and a left upper arm AV fistula was placed as well as stenting of her left SFA followed by amputation of her left second toe.  She has home health care is healing the toe amp well she is on dialysis via her catheter.  Generally she is feeling better.  She is taking her Plavix and also takes Coumadin.   Review of Systems No complaints today    Objective:   Physical Exam aaox3 Non labored respirations Palpable thrill in left upper arm avf Palpable left dp Left 2nd toe amp site healing well with sutures in place    Assessment/plan        62 year old female follows up from multiple recent procedures while in the hospital.  Sutures were removed from left second toe amputation site appears to be healing well she has a palpable dorsalis pedis pulse there.  She is a thrill in her fistula although this is only proximal and I cannot tell that it is actually maturing.  She will follow-up in 4 weeks with noninvasive studies of her left lower extremity and left upper extremity dialysis access.  She demonstrates good understanding we will see her in 4 weeks.  Kristen Berry C. Donzetta Matters, MD [facility] Office: [phone] Pager: [phone]

## 2017-11-28 ENCOUNTER — Other Ambulatory Visit: Payer: Self-pay

## 2017-11-28 DIAGNOSIS — N289 Disorder of kidney and ureter, unspecified: Secondary | ICD-10-CM

## 2017-11-28 DIAGNOSIS — M79606 Pain in leg, unspecified: Secondary | ICD-10-CM

## 2017-11-28 DIAGNOSIS — N184 Chronic kidney disease, stage 4 (severe): Secondary | ICD-10-CM

## 2018-01-06 ENCOUNTER — Encounter (HOSPITAL_COMMUNITY): Payer: Self-pay | Admitting: Emergency Medicine

## 2018-01-06 ENCOUNTER — Inpatient Hospital Stay (HOSPITAL_COMMUNITY): Payer: Medicare HMO

## 2018-01-06 ENCOUNTER — Other Ambulatory Visit: Payer: Self-pay

## 2018-01-06 ENCOUNTER — Inpatient Hospital Stay (HOSPITAL_COMMUNITY)
Admission: EM | Admit: 2018-01-06 | Discharge: 2018-01-12 | DRG: 371 | Disposition: A | Discharge status: Home / Self Care | Payer: Medicare HMO | Attending: Internal Medicine | Admitting: Internal Medicine

## 2018-01-06 DIAGNOSIS — E119 Type 2 diabetes mellitus without complications: Secondary | ICD-10-CM | POA: Diagnosis present

## 2018-01-06 DIAGNOSIS — Z992 Dependence on renal dialysis: Secondary | ICD-10-CM

## 2018-01-06 DIAGNOSIS — E1121 Type 2 diabetes mellitus with diabetic nephropathy: Secondary | ICD-10-CM | POA: Diagnosis present

## 2018-01-06 DIAGNOSIS — E876 Hypokalemia: Secondary | ICD-10-CM | POA: Diagnosis present

## 2018-01-06 DIAGNOSIS — I2510 Atherosclerotic heart disease of native coronary artery without angina pectoris: Secondary | ICD-10-CM | POA: Diagnosis present

## 2018-01-06 DIAGNOSIS — Z6829 Body mass index (BMI) 29.0-29.9, adult: Secondary | ICD-10-CM

## 2018-01-06 DIAGNOSIS — D631 Anemia in chronic kidney disease: Secondary | ICD-10-CM | POA: Diagnosis present

## 2018-01-06 DIAGNOSIS — I953 Hypotension of hemodialysis: Secondary | ICD-10-CM | POA: Diagnosis not present

## 2018-01-06 DIAGNOSIS — E1122 Type 2 diabetes mellitus with diabetic chronic kidney disease: Secondary | ICD-10-CM | POA: Diagnosis present

## 2018-01-06 DIAGNOSIS — I10 Essential (primary) hypertension: Secondary | ICD-10-CM | POA: Diagnosis not present

## 2018-01-06 DIAGNOSIS — I5042 Chronic combined systolic (congestive) and diastolic (congestive) heart failure: Secondary | ICD-10-CM | POA: Diagnosis present

## 2018-01-06 DIAGNOSIS — S2241XD Multiple fractures of ribs, right side, subsequent encounter for fracture with routine healing: Secondary | ICD-10-CM | POA: Diagnosis not present

## 2018-01-06 DIAGNOSIS — M19042 Primary osteoarthritis, left hand: Secondary | ICD-10-CM | POA: Diagnosis present

## 2018-01-06 DIAGNOSIS — N2581 Secondary hyperparathyroidism of renal origin: Secondary | ICD-10-CM | POA: Diagnosis present

## 2018-01-06 DIAGNOSIS — S2249XA Multiple fractures of ribs, unspecified side, initial encounter for closed fracture: Secondary | ICD-10-CM | POA: Diagnosis present

## 2018-01-06 DIAGNOSIS — R0600 Dyspnea, unspecified: Secondary | ICD-10-CM

## 2018-01-06 DIAGNOSIS — Z7902 Long term (current) use of antithrombotics/antiplatelets: Secondary | ICD-10-CM

## 2018-01-06 DIAGNOSIS — S2249XD Multiple fractures of ribs, unspecified side, subsequent encounter for fracture with routine healing: Secondary | ICD-10-CM

## 2018-01-06 DIAGNOSIS — E1169 Type 2 diabetes mellitus with other specified complication: Secondary | ICD-10-CM | POA: Diagnosis present

## 2018-01-06 DIAGNOSIS — Z89422 Acquired absence of other left toe(s): Secondary | ICD-10-CM | POA: Diagnosis not present

## 2018-01-06 DIAGNOSIS — Z79899 Other long term (current) drug therapy: Secondary | ICD-10-CM

## 2018-01-06 DIAGNOSIS — E1151 Type 2 diabetes mellitus with diabetic peripheral angiopathy without gangrene: Secondary | ICD-10-CM | POA: Diagnosis present

## 2018-01-06 DIAGNOSIS — N186 End stage renal disease: Secondary | ICD-10-CM | POA: Diagnosis present

## 2018-01-06 DIAGNOSIS — Z794 Long term (current) use of insulin: Secondary | ICD-10-CM

## 2018-01-06 DIAGNOSIS — Z888 Allergy status to other drugs, medicaments and biological substances status: Secondary | ICD-10-CM

## 2018-01-06 DIAGNOSIS — E7800 Pure hypercholesterolemia, unspecified: Secondary | ICD-10-CM | POA: Diagnosis present

## 2018-01-06 DIAGNOSIS — E1140 Type 2 diabetes mellitus with diabetic neuropathy, unspecified: Secondary | ICD-10-CM | POA: Diagnosis not present

## 2018-01-06 DIAGNOSIS — D472 Monoclonal gammopathy: Secondary | ICD-10-CM | POA: Diagnosis present

## 2018-01-06 DIAGNOSIS — E1149 Type 2 diabetes mellitus with other diabetic neurological complication: Secondary | ICD-10-CM | POA: Diagnosis present

## 2018-01-06 DIAGNOSIS — R0609 Other forms of dyspnea: Secondary | ICD-10-CM | POA: Diagnosis not present

## 2018-01-06 DIAGNOSIS — I132 Hypertensive heart and chronic kidney disease with heart failure and with stage 5 chronic kidney disease, or end stage renal disease: Secondary | ICD-10-CM | POA: Diagnosis present

## 2018-01-06 DIAGNOSIS — Z7901 Long term (current) use of anticoagulants: Secondary | ICD-10-CM | POA: Diagnosis not present

## 2018-01-06 DIAGNOSIS — R9431 Abnormal electrocardiogram [ECG] [EKG]: Secondary | ICD-10-CM | POA: Diagnosis present

## 2018-01-06 DIAGNOSIS — Z86718 Personal history of other venous thrombosis and embolism: Secondary | ICD-10-CM | POA: Diagnosis not present

## 2018-01-06 DIAGNOSIS — Z8249 Family history of ischemic heart disease and other diseases of the circulatory system: Secondary | ICD-10-CM | POA: Diagnosis not present

## 2018-01-06 DIAGNOSIS — G4730 Sleep apnea, unspecified: Secondary | ICD-10-CM | POA: Diagnosis present

## 2018-01-06 DIAGNOSIS — A0472 Enterocolitis due to Clostridium difficile, not specified as recurrent: Secondary | ICD-10-CM | POA: Diagnosis present

## 2018-01-06 DIAGNOSIS — E860 Dehydration: Secondary | ICD-10-CM | POA: Diagnosis present

## 2018-01-06 DIAGNOSIS — E669 Obesity, unspecified: Secondary | ICD-10-CM | POA: Diagnosis present

## 2018-01-06 DIAGNOSIS — M170 Bilateral primary osteoarthritis of knee: Secondary | ICD-10-CM | POA: Diagnosis present

## 2018-01-06 DIAGNOSIS — Z87442 Personal history of urinary calculi: Secondary | ICD-10-CM

## 2018-01-06 DIAGNOSIS — Z95820 Peripheral vascular angioplasty status with implants and grafts: Secondary | ICD-10-CM

## 2018-01-06 DIAGNOSIS — M19041 Primary osteoarthritis, right hand: Secondary | ICD-10-CM | POA: Diagnosis present

## 2018-01-06 DIAGNOSIS — I959 Hypotension, unspecified: Secondary | ICD-10-CM | POA: Diagnosis present

## 2018-01-06 LAB — CBC WITH DIFFERENTIAL/PLATELET
Basophils Absolute: 0 10*3/uL (ref 0.0–0.1)
Basophils Relative: 0 %
Eosinophils Absolute: 0.1 10*3/uL (ref 0.0–0.7)
Eosinophils Relative: 1 %
HCT: 33.5 % — ABNORMAL LOW (ref 36.0–46.0)
Hemoglobin: 10.7 g/dL — ABNORMAL LOW (ref 12.0–15.0)
Lymphocytes Relative: 28 %
Lymphs Abs: 3.5 10*3/uL (ref 0.7–4.0)
MCH: 30.7 pg (ref 26.0–34.0)
MCHC: 31.9 g/dL (ref 30.0–36.0)
MCV: 96 fL (ref 78.0–100.0)
Monocytes Absolute: 0.9 10*3/uL (ref 0.1–1.0)
Monocytes Relative: 7 %
Neutro Abs: 8 10*3/uL — ABNORMAL HIGH (ref 1.7–7.7)
Neutrophils Relative %: 64 %
Platelets: 421 10*3/uL — ABNORMAL HIGH (ref 150–400)
RBC: 3.49 MIL/uL — ABNORMAL LOW (ref 3.87–5.11)
RDW: 17.7 % — ABNORMAL HIGH (ref 11.5–15.5)
WBC: 12.6 10*3/uL — ABNORMAL HIGH (ref 4.0–10.5)

## 2018-01-06 LAB — C DIFFICILE QUICK SCREEN W PCR REFLEX
C Diff antigen: POSITIVE — AB
C Diff toxin: NEGATIVE

## 2018-01-06 LAB — COMPREHENSIVE METABOLIC PANEL
ALT: 10 U/L — ABNORMAL LOW (ref 14–54)
AST: 13 U/L — ABNORMAL LOW (ref 15–41)
Albumin: 2.4 g/dL — ABNORMAL LOW (ref 3.5–5.0)
Alkaline Phosphatase: 97 U/L (ref 38–126)
Anion gap: 12 (ref 5–15)
BUN: 18 mg/dL (ref 6–20)
CO2: 27 mmol/L (ref 22–32)
Calcium: 7.9 mg/dL — ABNORMAL LOW (ref 8.9–10.3)
Chloride: 97 mmol/L — ABNORMAL LOW (ref 101–111)
Creatinine, Ser: 2.48 mg/dL — ABNORMAL HIGH (ref 0.44–1.00)
GFR calc Af Amer: 23 mL/min — ABNORMAL LOW (ref >60)
GFR calc non Af Amer: 20 mL/min — ABNORMAL LOW (ref >60)
Glucose, Bld: 110 mg/dL — ABNORMAL HIGH (ref 65–99)
Potassium: 2.8 mmol/L — ABNORMAL LOW (ref 3.5–5.1)
Sodium: 136 mmol/L (ref 135–145)
Total Bilirubin: 0.5 mg/dL (ref 0.3–1.2)
Total Protein: 5.4 g/dL — ABNORMAL LOW (ref 6.5–8.1)

## 2018-01-06 LAB — LIPASE, BLOOD: Lipase: 22 U/L (ref 11–51)

## 2018-01-06 LAB — PROTIME-INR
INR: 4.93
Prothrombin Time: 45.5 seconds — ABNORMAL HIGH (ref 11.4–15.2)

## 2018-01-06 LAB — CLOSTRIDIUM DIFFICILE BY PCR, REFLEXED: Toxigenic C. Difficile by PCR: POSITIVE — AB

## 2018-01-06 MED ORDER — SODIUM CHLORIDE 0.9 % IV BOLUS
250.0000 mL | Freq: Once | INTRAVENOUS | Status: AC
  Administered 2018-01-06: 250 mL via INTRAVENOUS

## 2018-01-06 MED ORDER — POTASSIUM CHLORIDE CRYS ER 20 MEQ PO TBCR
20.0000 meq | EXTENDED_RELEASE_TABLET | Freq: Once | ORAL | Status: AC
  Administered 2018-01-06: 20 meq via ORAL
  Filled 2018-01-06: qty 1

## 2018-01-06 MED ORDER — ONDANSETRON HCL 4 MG/2ML IJ SOLN
4.0000 mg | Freq: Once | INTRAMUSCULAR | Status: AC
  Administered 2018-01-06: 4 mg via INTRAVENOUS
  Filled 2018-01-06: qty 2

## 2018-01-06 MED ORDER — VANCOMYCIN 50 MG/ML ORAL SOLUTION
125.0000 mg | Freq: Four times a day (QID) | ORAL | Status: DC
  Administered 2018-01-06 – 2018-01-12 (×19): 125 mg via ORAL
  Filled 2018-01-06 (×27): qty 2.5

## 2018-01-06 MED ORDER — MORPHINE SULFATE (PF) 4 MG/ML IV SOLN
2.0000 mg | Freq: Once | INTRAVENOUS | Status: AC
  Administered 2018-01-06: 2 mg via INTRAVENOUS
  Filled 2018-01-06: qty 1
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
## 2018-01-06 NOTE — ED Triage Notes (Signed)
Patient arrived to ED via [facility]. She reports that she normally has diarrhea after dialysis but this Thursday was the "worst" because she felt "so weak" Patient reports that she could have diarrhea up to 15 times after dialysis. She reports that she was prescribe medicine for this diarrhea.
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
## 2018-01-07 LAB — COMPREHENSIVE METABOLIC PANEL
ALT: 8 U/L — ABNORMAL LOW (ref 14–54)
AST: 11 U/L — ABNORMAL LOW (ref 15–41)
Albumin: 2.2 g/dL — ABNORMAL LOW (ref 3.5–5.0)
Alkaline Phosphatase: 87 U/L (ref 38–126)
Anion gap: 12 (ref 5–15)
BUN: 20 mg/dL (ref 6–20)
CO2: 23 mmol/L (ref 22–32)
Calcium: 7.6 mg/dL — ABNORMAL LOW (ref 8.9–10.3)
Chloride: 100 mmol/L — ABNORMAL LOW (ref 101–111)
Creatinine, Ser: 2.38 mg/dL — ABNORMAL HIGH (ref 0.44–1.00)
GFR calc Af Amer: 24 mL/min — ABNORMAL LOW (ref >60)
GFR calc non Af Amer: 21 mL/min — ABNORMAL LOW (ref >60)
Glucose, Bld: 86 mg/dL (ref 65–99)
Potassium: 2.6 mmol/L — CL (ref 3.5–5.1)
Sodium: 135 mmol/L (ref 135–145)
Total Bilirubin: 0.5 mg/dL (ref 0.3–1.2)
Total Protein: 4.8 g/dL — ABNORMAL LOW (ref 6.5–8.1)

## 2018-01-07 LAB — RENAL FUNCTION PANEL
Albumin: 2.4 g/dL — ABNORMAL LOW (ref 3.5–5.0)
Anion gap: 11 (ref 5–15)
BUN: 22 mg/dL — ABNORMAL HIGH (ref 6–20)
CO2: 24 mmol/L (ref 22–32)
Calcium: 7.7 mg/dL — ABNORMAL LOW (ref 8.9–10.3)
Chloride: 97 mmol/L — ABNORMAL LOW (ref 101–111)
Creatinine, Ser: 2.66 mg/dL — ABNORMAL HIGH (ref 0.44–1.00)
GFR calc Af Amer: 21 mL/min — ABNORMAL LOW (ref >60)
GFR calc non Af Amer: 18 mL/min — ABNORMAL LOW (ref >60)
Glucose, Bld: 114 mg/dL — ABNORMAL HIGH (ref 65–99)
Phosphorus: 3.7 mg/dL (ref 2.5–4.6)
Potassium: 3.2 mmol/L — ABNORMAL LOW (ref 3.5–5.1)
Sodium: 132 mmol/L — ABNORMAL LOW (ref 135–145)

## 2018-01-07 LAB — PHOSPHORUS: Phosphorus: 3.5 mg/dL (ref 2.5–4.6)

## 2018-01-07 LAB — GASTROINTESTINAL PANEL BY PCR, STOOL (REPLACES STOOL CULTURE)

## 2018-01-07 LAB — CBC
HCT: 31.2 % — ABNORMAL LOW (ref 36.0–46.0)
Hemoglobin: 10.2 g/dL — ABNORMAL LOW (ref 12.0–15.0)
MCH: 31.1 pg (ref 26.0–34.0)
MCHC: 32.7 g/dL (ref 30.0–36.0)
MCV: 95.1 fL (ref 78.0–100.0)
Platelets: 372 10*3/uL (ref 150–400)
RBC: 3.28 MIL/uL — ABNORMAL LOW (ref 3.87–5.11)
RDW: 17.7 % — ABNORMAL HIGH (ref 11.5–15.5)
WBC: 12.1 10*3/uL — ABNORMAL HIGH (ref 4.0–10.5)

## 2018-01-07 LAB — MAGNESIUM: Magnesium: 1.6 mg/dL — ABNORMAL LOW (ref 1.7–2.4)

## 2018-01-07 LAB — PROTIME-INR
INR: 4.73
Prothrombin Time: 44.1 seconds — ABNORMAL HIGH (ref 11.4–15.2)

## 2018-01-07 LAB — TROPONIN I
Troponin I: <0.03 ng/mL (ref <0.03)
Troponin I: 0.07 ng/mL (ref <0.03)

## 2018-01-07 LAB — MRSA PCR SCREENING: MRSA by PCR: POSITIVE — AB

## 2018-01-07 LAB — TSH: TSH: 1.349 u[IU]/mL (ref 0.350–4.500)

## 2018-01-07 MED ORDER — HEPARIN SODIUM (PORCINE) 1000 UNIT/ML DIALYSIS: 1000.0000 [IU] | INTRAMUSCULAR | Status: DC | PRN

## 2018-01-07 MED ORDER — LEVALBUTEROL HCL 0.63 MG/3ML IN NEBU: 0.6300 mg | INHALATION_SOLUTION | Freq: Four times a day (QID) | RESPIRATORY_TRACT | Status: DC | PRN

## 2018-01-07 MED ORDER — ACETAMINOPHEN 650 MG RE SUPP: 650.0000 mg | Freq: Four times a day (QID) | RECTAL | Status: DC | PRN

## 2018-01-07 MED ORDER — CARVEDILOL 12.5 MG PO TABS
12.5000 mg | ORAL_TABLET | Freq: Two times a day (BID) | ORAL | Status: DC
  Administered 2018-01-07 – 2018-01-12 (×11): 12.5 mg via ORAL
  Filled 2018-01-07 (×11): qty 1

## 2018-01-07 MED ORDER — HYDROCODONE-ACETAMINOPHEN 5-325 MG PO TABS
1.0000 | ORAL_TABLET | ORAL | Status: DC | PRN
Start: 2018-01-07 — End: 2018-01-12
  Administered 2018-01-07 (×2): 1 via ORAL
  Administered 2018-01-08 – 2018-01-10 (×7): 2 via ORAL
  Administered 2018-01-11: 1 via ORAL
  Administered 2018-01-11: 2 via ORAL
  Administered 2018-01-12: 1 via ORAL
  Filled 2018-01-07 (×3): qty 2
  Filled 2018-01-07 (×2): qty 1
  Filled 2018-01-07: qty 2
  Filled 2018-01-07 (×2): qty 1
  Filled 2018-01-07 (×4): qty 2

## 2018-01-07 MED ORDER — CALCITRIOL 0.25 MCG PO CAPS
0.7500 ug | ORAL_CAPSULE | Freq: Every day | ORAL | Status: DC
  Administered 2018-01-07 – 2018-01-12 (×6): 0.75 ug via ORAL
  Filled 2018-01-07 (×5): qty 3
  Filled 2018-01-07: qty 1

## 2018-01-07 MED ORDER — ALTEPLASE 2 MG IJ SOLR: 2.0000 mg | Freq: Once | INTRAMUSCULAR | Status: DC | PRN

## 2018-01-07 MED ORDER — ATORVASTATIN CALCIUM 20 MG PO TABS
20.0000 mg | ORAL_TABLET | Freq: Every day | ORAL | Status: DC
  Administered 2018-01-07 – 2018-01-12 (×6): 20 mg via ORAL
  Filled 2018-01-07 (×7): qty 1

## 2018-01-07 MED ORDER — ACETAMINOPHEN 325 MG PO TABS: 650.0000 mg | ORAL_TABLET | Freq: Four times a day (QID) | ORAL | Status: DC | PRN

## 2018-01-07 MED ORDER — CLOPIDOGREL BISULFATE 75 MG PO TABS
75.0000 mg | ORAL_TABLET | Freq: Every day | ORAL | Status: DC
  Administered 2018-01-07 – 2018-01-12 (×6): 75 mg via ORAL
  Filled 2018-01-07 (×6): qty 1

## 2018-01-07 MED ORDER — PENTAFLUOROPROP-TETRAFLUOROETH EX AERO: 1.0000 "application " | INHALATION_SPRAY | CUTANEOUS | Status: DC | PRN

## 2018-01-07 MED ORDER — SODIUM CHLORIDE 0.9 % IV SOLN: 250.0000 mL | INTRAVENOUS | Status: DC | PRN

## 2018-01-07 MED ORDER — NEPRO/CARBSTEADY PO LIQD
237.0000 mL | Freq: Three times a day (TID) | ORAL | Status: DC
  Administered 2018-01-08 – 2018-01-12 (×8): 237 mL via ORAL
  Filled 2018-01-07 (×19): qty 237

## 2018-01-07 MED ORDER — SACCHAROMYCES BOULARDII 250 MG PO CAPS
250.0000 mg | ORAL_CAPSULE | Freq: Two times a day (BID) | ORAL | Status: DC
  Administered 2018-01-07 – 2018-01-12 (×10): 250 mg via ORAL
  Filled 2018-01-07 (×11): qty 1

## 2018-01-07 MED ORDER — CHOLESTYRAMINE LIGHT 4 G PO PACK
4.0000 g | PACK | Freq: Two times a day (BID) | ORAL | Status: DC
  Administered 2018-01-07 – 2018-01-10 (×6): 4 g via ORAL
  Filled 2018-01-07 (×7): qty 1

## 2018-01-07 MED ORDER — SODIUM CHLORIDE 0.9% FLUSH: 3.0000 mL | INTRAVENOUS | Status: DC | PRN

## 2018-01-07 MED ORDER — SODIUM CHLORIDE 0.9 % IV SOLN: 100.0000 mL | INTRAVENOUS | Status: DC | PRN

## 2018-01-07 MED ORDER — MUPIROCIN 2 % EX OINT
1.0000 "application " | TOPICAL_OINTMENT | Freq: Two times a day (BID) | CUTANEOUS | Status: AC
  Administered 2018-01-07 – 2018-01-12 (×10): 1 via NASAL
  Filled 2018-01-07: qty 22

## 2018-01-07 MED ORDER — WARFARIN - PHARMACIST DOSING INPATIENT
Freq: Every day | Status: DC
  Administered 2018-01-09 – 2018-01-12 (×4)

## 2018-01-07 MED ORDER — PANTOPRAZOLE SODIUM 40 MG PO TBEC
40.0000 mg | DELAYED_RELEASE_TABLET | Freq: Every day | ORAL | Status: DC
  Administered 2018-01-07: 40 mg via ORAL
  Filled 2018-01-07: qty 1

## 2018-01-07 MED ORDER — CHLORHEXIDINE GLUCONATE CLOTH 2 % EX PADS
6.0000 | MEDICATED_PAD | Freq: Every day | CUTANEOUS | Status: AC
  Administered 2018-01-08 – 2018-01-12 (×5): 6 via TOPICAL

## 2018-01-07 MED ORDER — LIDOCAINE HCL (PF) 1 % IJ SOLN: 5.0000 mL | INTRAMUSCULAR | Status: DC | PRN

## 2018-01-07 MED ORDER — ONDANSETRON HCL 4 MG PO TABS
4.0000 mg | ORAL_TABLET | Freq: Four times a day (QID) | ORAL | Status: DC | PRN
  Filled 2018-01-07: qty 1

## 2018-01-07 MED ORDER — RENA-VITE PO TABS
1.0000 | ORAL_TABLET | Freq: Every day | ORAL | Status: DC
  Administered 2018-01-07 – 2018-01-11 (×5): 1 via ORAL
  Filled 2018-01-07 (×6): qty 1

## 2018-01-07 MED ORDER — LIDOCAINE-PRILOCAINE 2.5-2.5 % EX CREA: 1.0000 "application " | TOPICAL_CREAM | CUTANEOUS | Status: DC | PRN

## 2018-01-07 MED ORDER — POTASSIUM CHLORIDE CRYS ER 20 MEQ PO TBCR
40.0000 meq | EXTENDED_RELEASE_TABLET | Freq: Once | ORAL | Status: AC
  Administered 2018-01-07: 40 meq via ORAL
  Filled 2018-01-07: qty 2

## 2018-01-07 MED ORDER — SODIUM CHLORIDE 0.9% FLUSH
3.0000 mL | Freq: Two times a day (BID) | INTRAVENOUS | Status: DC
  Administered 2018-01-07 – 2018-01-11 (×9): 3 mL via INTRAVENOUS

## 2018-01-07 MED ORDER — ONDANSETRON HCL 4 MG/2ML IJ SOLN: 4.0000 mg | Freq: Four times a day (QID) | INTRAMUSCULAR | Status: DC | PRN

## 2018-01-07 NOTE — Progress Notes (Signed)
Dialysis treatment completed.  3000 mL ultrafiltrated.  2500 mL net fluid removal.  Patient status unchanged. Lung sounds diminished to ausculation in all fields. Generalized edema. Cardiac: NSR.  Cleansed RIJ catheter with chlorhexidine.  Disconnected lines and flushed ports with saline per protocol.  Ports locked with heparin and capped per protocol.    Report given to bedside, RN Katelyn.

## 2018-01-07 NOTE — ED Notes (Signed)
Report given to 2C RN 

## 2018-01-07 NOTE — Progress Notes (Signed)
ANTICOAGULATION CONSULT NOTE - Initial Consult  Pharmacy Consult for Coumadin Indication: h/o DVT  Allergies  Allergen Reactions  . Crestor [Rosuvastatin Calcium] Other (See Comments)    Leg pain    Patient Measurements:    Vital Signs: BP: 167/67 (04/13 0115) Pulse Rate: 79 (04/13 0500)  Labs: Recent Labs    01/06/18 1806 01/06/18 2131 01/07/18 0352  HGB 10.7*  --   --   HCT 33.5*  --   --   PLT 421*  --   --   LABPROT  --  45.5*  --   INR  --  4.93*  --   CREATININE 2.48*  --   --   TROPONINI  --   --  <0.03    CrCl cannot be calculated (Unknown ideal weight.).   Medical History: Past Medical History:  Diagnosis Date  . Arthritis    "hands, knees" (10/10/2017)  . CHF (congestive heart failure) ([place])   . Chronic kidney disease (CKD), stage IV (severe) ([place])   . High cholesterol   . History of kidney stones   . Hypertension   . PVD (peripheral vascular disease) ([place])    "LLE; will have OR" (10/10/2017)  . Sleep apnea    "never given mask" (10/10/2017)  . Type II diabetes mellitus ([place])    "no RX anymore" (10/10/2017)    Medications:  No current facility-administered medications on file prior to encounter.    Current Outpatient Medications on File Prior to Encounter  Medication Sig Dispense Refill  . acetaminophen (TYLENOL) 500 MG tablet Take 1,500 mg by mouth at bedtime as needed for headache (pain).    [name] albuterol (PROAIR HFA) 108 (90 Base) MCG/ACT inhaler Inhale 2 puffs into the lungs every 6 (six) hours as needed for wheezing or shortness of breath.    [name] atorvastatin (LIPITOR) 20 MG tablet Take 1 tablet (20 mg total) by mouth daily at 6 PM. 30 tablet 0  . carvedilol (COREG) 12.5 MG tablet Take 1 tablet (12.5 mg total) by mouth 2 (two) times daily with a meal. 30 tablet 0  . clopidogrel (PLAVIX) 75 MG tablet Take 1 tablet (75 mg total) by mouth daily with breakfast. 30 tablet 0  . diphenoxylate-atropine (LOMOTIL) 2.5-0.025 MG tablet Take 1 tablet by mouth  4 (four) times daily as needed for diarrhea or loose stools.    . hydrALAZINE (APRESOLINE) 25 MG tablet Take 1 tablet (25 mg total) by mouth 2 (two) times daily. 60 tablet 0  . pantoprazole (PROTONIX) 40 MG tablet Take 1 tablet (40 mg total) by mouth daily. 30 tablet 0  . warfarin (COUMADIN) 5 MG tablet Take 1 tablet (5 mg total) by mouth daily. Check INR at [facility] on Saturday 30 tablet 0     Assessment: 62 y.o. female admitted with C diff colitits, h/o DVT, to continue Coumadin.  INR supratherapeutic this morning   Goal of Therapy:  INR 2-3 Monitor platelets by anticoagulation protocol: Yes   Plan:  No Coumadin today Daily INR  Caryl Pina 01/07/2018,6:09 AM

## 2018-01-07 NOTE — Progress Notes (Signed)
ANTICOAGULATION CONSULT NOTE - Follow Up Consult  Pharmacy Consult for warfarin Indication: DVT  Allergies  Allergen Reactions  . Crestor [Rosuvastatin Calcium] Other (See Comments)    Leg pain    Patient Measurements: Weight: 197 lb 1.5 oz (89.4 kg)(standing)   Vital Signs: Temp: 98.7 F (37.1 C) (04/13 1330) BP: 163/64 (04/13 1500) Pulse Rate: 80 (04/13 1500)  Labs: Recent Labs    01/06/18 1806 01/06/18 2131 01/07/18 0352 01/07/18 0632 01/07/18 1019  HGB 10.7*  --   --  10.2*  --   HCT 33.5*  --   --  31.2*  --   PLT 421*  --   --  372  --   LABPROT  --  45.5*  --   --  44.1*  INR  --  4.93*  --   --  4.73*  CREATININE 2.48*  --   --  2.38*  --   TROPONINI  --   --  <0.03  --  0.07*    Estimated Creatinine Clearance: 28.7 mL/min (A) (by C-G formula based on SCr of 2.38 mg/dL (H)).   Medications:  Scheduled:  . atorvastatin  20 mg Oral q1800  . calcitRIOL  0.75 mcg Oral Daily  . carvedilol  12.5 mg Oral BID WC  . cholestyramine light  4 g Oral BID  . clopidogrel  75 mg Oral Q breakfast  . feeding supplement (NEPRO CARB STEADY)  237 mL Oral TID AC  . multivitamin  1 tablet Oral QHS  . pantoprazole  40 mg Oral Daily  . saccharomyces boulardii  250 mg Oral BID  . sodium chloride flush  3 mL Intravenous Q12H  . vancomycin  125 mg Oral QID  . Warfarin - Pharmacist Dosing Inpatient   Does not apply q1800   Infusions:  . sodium chloride    . sodium chloride    . sodium chloride      Assessment: 61 YOF on warfarin PTA for DVT in s/o ESRD, admitted with Cdiff. INR 4.93 on admission, likely due to acute illness. Last warfarin dose 4/11 ~1830 per med rec. CBC is stable, no bleeding noted. INR trending down slowly, today it is 4.73.   Home warfarin regimen: 5 mg daily  Goal of Therapy:  INR 2-3 Monitor platelets by anticoagulation protocol: Yes   Plan:  Hold warfarin tonight Daily INR, CBC - restart warfarin when INR closer to 3 Monitor s/sx of  bleeding   Charlene Brooke, PharmD PGY1 Pharmacy Resident Phone: [phone] After 3:30PM please call [place] [phone] 01/07/2018,3:12 PM

## 2018-01-07 NOTE — ED Notes (Signed)
Pharmacy sent notes X 2 for missing Vanc.

## 2018-01-07 NOTE — ED Notes (Signed)
Report given RN at dialysis.

## 2018-01-07 NOTE — Progress Notes (Signed)
Pt positive for MRSA per lab.  Pt already on contact.  Will place standing orders for positive MRSA.  Will continue to monitor.  MD made aware. Saunders Revel T

## 2018-01-07 NOTE — Procedures (Signed)
   I was present at this dialysis session, have reviewed the session itself and made  appropriate changes [name] MD [facility] pager [phone]   01/07/2018, 4:51 PM

## 2018-01-07 NOTE — ED Notes (Signed)
Admitting MD Zigmund Daniel paged about pt troponin of 0.07
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
## 2018-01-07 NOTE — ED Notes (Signed)
Renal/carb mod/fluid restriction breakfast ordered

## 2018-01-07 NOTE — Progress Notes (Signed)
Patient arrived to unit by ED stretcher.  Reviewed treatment plan and this RN agrees with plan.  Report received from bedside RN, Vicente Males.  Consent obtained.  Patient A & o X 4.   Lung sounds diminished and clear to ausculation in all fields. Generalized edema. Cardiac:  NSR.  Removed caps and cleansed RIJ catheter with chlorhedxidine.  Aspirated ports of heparin and flushed them with saline per protocol.  Connected and secured lines, initiated treatment at 1350.  UF Goal of 2500 mL and net fluid removal 2 L.  Will continue to monitor.
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
## 2018-01-07 NOTE — ED Notes (Signed)
MD Zigmund Daniel informed of pt critical K as reported by Lab.

## 2018-01-07 NOTE — ED Notes (Signed)
Admitting paged to RN per her request 

## 2018-01-07 NOTE — Progress Notes (Signed)
Critical lab value received: INR 4.73  Nephrology notified.  Will continue to monitor.

## 2018-01-07 NOTE — ED Notes (Signed)
Called dialysis per pt request to get an estimate on time.

## 2018-01-07 NOTE — ED Notes (Signed)
Renal, carb modified, fluid restriction 1200 mL diet lunch tray ordered

## 2018-01-08 LAB — CBC
HCT: 31.8 % — ABNORMAL LOW (ref 36.0–46.0)
Hemoglobin: 10.3 g/dL — ABNORMAL LOW (ref 12.0–15.0)
MCH: 31.7 pg (ref 26.0–34.0)
MCHC: 32.4 g/dL (ref 30.0–36.0)
MCV: 97.8 fL (ref 78.0–100.0)
Platelets: 375 10*3/uL (ref 150–400)
RBC: 3.25 MIL/uL — ABNORMAL LOW (ref 3.87–5.11)
RDW: 18 % — ABNORMAL HIGH (ref 11.5–15.5)
WBC: 11.5 10*3/uL — ABNORMAL HIGH (ref 4.0–10.5)

## 2018-01-08 LAB — RENAL FUNCTION PANEL
Albumin: 2.1 g/dL — ABNORMAL LOW (ref 3.5–5.0)
Anion gap: 10 (ref 5–15)
BUN: 11 mg/dL (ref 6–20)
CO2: 26 mmol/L (ref 22–32)
Calcium: 8.1 mg/dL — ABNORMAL LOW (ref 8.9–10.3)
Chloride: 99 mmol/L — ABNORMAL LOW (ref 101–111)
Creatinine, Ser: 1.77 mg/dL — ABNORMAL HIGH (ref 0.44–1.00)
GFR calc Af Amer: 34 mL/min — ABNORMAL LOW (ref >60)
GFR calc non Af Amer: 30 mL/min — ABNORMAL LOW (ref >60)
Glucose, Bld: 86 mg/dL (ref 65–99)
Phosphorus: 3 mg/dL (ref 2.5–4.6)
Potassium: 3.6 mmol/L (ref 3.5–5.1)
Sodium: 135 mmol/L (ref 135–145)

## 2018-01-08 LAB — PROTIME-INR
INR: 4.11
Prothrombin Time: 39.5 seconds — ABNORMAL HIGH (ref 11.4–15.2)

## 2018-01-08 LAB — MAGNESIUM: Magnesium: 1.8 mg/dL (ref 1.7–2.4)

## 2018-01-08 MED ORDER — FAMOTIDINE 20 MG PO TABS
20.0000 mg | ORAL_TABLET | Freq: Every day | ORAL | Status: DC
  Administered 2018-01-09 – 2018-01-12 (×4): 20 mg via ORAL
  Filled 2018-01-08 (×4): qty 1

## 2018-01-08 NOTE — Progress Notes (Signed)
[facility] Progress Note  Subjective: lots of diarrhea overnight , no new c/o per patient. 2.5 L off net on HD yest w/o BP dropps.   Vitals:   01/07/18 2300 01/08/18 0338 01/08/18 0347 01/08/18 0834  BP: (!) 121/45 (!) 116/51  (!) 123/51  Pulse: 80 76 81 70  Resp: 16 18  14   Temp: 98.6 F (37 C)  97.9 F (36.6 C) 98.4 F (36.9 C)  TempSrc: Oral Oral Oral Oral  SpO2: 96% 98% 100% 100%  Weight:   86.4 kg (190 lb 8 oz)   Height:        Inpatient medications: . atorvastatin  20 mg Oral q1800  . calcitRIOL  0.75 mcg Oral Daily  . carvedilol  12.5 mg Oral BID WC  . Chlorhexidine Gluconate Cloth  6 each Topical Q0600  . cholestyramine light  4 g Oral BID  . clopidogrel  75 mg Oral Q breakfast  . famotidine  20 mg Oral Daily  . feeding supplement (NEPRO CARB STEADY)  237 mL Oral TID AC  . multivitamin  1 tablet Oral QHS  . mupirocin ointment  1 application Nasal BID  . saccharomyces boulardii  250 mg Oral BID  . sodium chloride flush  3 mL Intravenous Q12H  . vancomycin  125 mg Oral QID  . Warfarin - Pharmacist Dosing Inpatient   Does not apply q1800   . sodium chloride     sodium chloride, acetaminophen **OR** acetaminophen, HYDROcodone-acetaminophen, levalbuterol, ondansetron **OR** ondansetron (ZOFRAN) IV, sodium chloride flush  Exam: Alert, no distress, WDWN No jvd Chest cta bilat RRR no mrg Abd soft ntnd +bs Ext pedal edema mostly resolved NF, ox 3 TDC/ LUA AVF maturing  Dialysis: [facility] 4h  400/800  88.5kg  2/2.25 bath  Hep none  R IJ TDC/ LUA AVF maturing - Venofer 100mg  IV qHD x10, 8/10 completed.  - Calcitriol 0.72mcg qHD - 4/11: Hgb 10.6, K 3.4 3/28: Ca 8.5, P 4.4, PTH 741, Albumin 3.4       Impression: 1  Cdif infection/ diarrhea - per primary 2  ESRD - cont HD TTS 3  Vol excess- has lost body wt, lowering dry wt 4  HTN - stable 5  Anemia ckd -Hb 10, no need esa at this time 6  MBD ckd - no chg meds 7  DM2 8  Hx DVT - on  coumadin   Plan - cont HD TTS while here   [name] MD [facility] pager [phone]   01/08/2018, 11:33 AM   Recent Labs  Lab 01/07/18 0632 01/07/18 1424 01/08/18 0651  NA 135 132* 135  K 2.6* 3.2* 3.6  CL 100* 97* 99*  CO2 23 24 26   GLUCOSE 86 114* 86  BUN 20 22* 11  CREATININE 2.38* 2.66* 1.77*  CALCIUM 7.6* 7.7* 8.1*  PHOS 3.5 3.7 3.0   Recent Labs  Lab 01/06/18 1806 01/07/18 0632 01/07/18 1424 01/08/18 0651  AST 13* 11*  --   --   ALT 10* 8*  --   --   ALKPHOS 97 87  --   --   BILITOT 0.5 0.5  --   --   PROT 5.4* 4.8*  --   --   ALBUMIN 2.4* 2.2* 2.4* 2.1*   Recent Labs  Lab 01/06/18 1806 01/07/18 0632 01/08/18 0651  WBC 12.6* 12.1* 11.5*  NEUTROABS 8.0*  --   --   HGB 10.7* 10.2* 10.3*  HCT 33.5* 31.2* 31.8*  MCV 96.0 95.1 97.8  PLT 421* 372 375   Iron/TIBC/Ferritin/ %Sat    Component Value Date/Time   IRON 37 10/18/2017 0507   TIBC 209 (L) 10/18/2017 0507   FERRITIN 118 10/18/2017 0507   IRONPCTSAT 18 10/18/2017 0507
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
## 2018-01-08 NOTE — Progress Notes (Signed)
Critical INR called from lab.  Md made aware. Will continue to monitor .Saunders Revel T

## 2018-01-08 NOTE — Progress Notes (Signed)
ANTICOAGULATION CONSULT NOTE - Follow Up Consult  Pharmacy Consult for warfarin Indication: DVT  Allergies  Allergen Reactions  . Crestor [Rosuvastatin Calcium] Other (See Comments)    Leg pain    Patient Measurements: Height: 5\' 8"  (172.7 cm) Weight: 190 lb 8 oz (86.4 kg) IBW/kg (Calculated) : 63.9   Vital Signs: Temp: 97.9 F (36.6 C) (04/14 1223) Temp Source: Oral (04/14 1223) BP: 141/99 (04/14 1223) Pulse Rate: 82 (04/14 1223)  Labs: Recent Labs    01/06/18 1806 01/06/18 2131 01/07/18 0352 01/07/18 0632 01/07/18 1019 01/07/18 1424 01/08/18 0301 01/08/18 0651  HGB 10.7*  --   --  10.2*  --   --   --  10.3*  HCT 33.5*  --   --  31.2*  --   --   --  31.8*  PLT 421*  --   --  372  --   --   --  375  LABPROT  --  Kristen.5*  --   --  44.1*  --  39.5*  --   INR  --  4.93*  --   --  4.73*  --  4.11*  --   CREATININE 2.48*  --   --  2.38*  --  2.66*  --  1.77*  TROPONINI  --   --  <0.03  --  0.07*  --   --   --     Estimated Creatinine Clearance: 37.9 mL/min (A) (by C-G formula based on SCr of 1.77 mg/dL (H)).   Medications:  Scheduled:  . atorvastatin  20 mg Oral q1800  . calcitRIOL  0.75 mcg Oral Daily  . carvedilol  12.5 mg Oral BID WC  . Chlorhexidine Gluconate Cloth  6 each Topical Q0600  . cholestyramine light  4 g Oral BID  . clopidogrel  75 mg Oral Q breakfast  . famotidine  20 mg Oral Daily  . feeding supplement (NEPRO CARB STEADY)  237 mL Oral TID AC  . multivitamin  1 tablet Oral QHS  . mupirocin ointment  1 application Nasal BID  . saccharomyces boulardii  250 mg Oral BID  . sodium chloride flush  3 mL Intravenous Q12H  . vancomycin  125 mg Oral QID  . Warfarin - Pharmacist Dosing Inpatient   Does not apply q1800   Infusions:  . sodium chloride      Assessment: Kristen Berry on warfarin PTA for DVT in s/o ESRD, admitted with Cdiff. INR 4.93 on admission, likely due to acute illness. Last warfarin dose 4/11 ~1830 per med rec.   CBC is stable, no  bleeding noted. INR trending down slowly, today it is 4.1. Will continue to hold.  Home warfarin regimen: 5 mg daily  Goal of Therapy:  INR 2-3 Monitor platelets by anticoagulation protocol: Yes   Plan:  Hold warfarin tonight Daily INR, CBC - restart warfarin when INR closer to 3 Monitor s/sx of bleeding  Erin Hearing PharmD., BCPS Clinical Pharmacist 01/08/2018 2:33 PM
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
## 2018-01-09 LAB — PROTIME-INR
INR: 3.85
Prothrombin Time: 37.5 seconds — ABNORMAL HIGH (ref 11.4–15.2)
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
## 2018-01-09 NOTE — Care Management (Signed)
Vancomycin Benefit Check VANCOMYCIN 125 MG 4 X A DAY  COVER- YES  CO-PAY- $ 1.25  TIER- 4 DRUG  PRIOR APPROVAL- NO   PREFERRED PHARMACY : [facility]   PATIENT HAS : MEDICAID [place] ALSO  CO-PAY- $ 3.80 FOR EACH RX  EFF-DATE:09-27-2017

## 2018-01-09 NOTE — Progress Notes (Addendum)
[facility] Progress Note   Subjective:   Diarrhea slightly better this AM.  Feeling much better after a shower.   Objective Vitals:   01/08/18 1908 01/09/18 0300 01/09/18 0521 01/09/18 0805  BP: 122/75 (!) 146/76  (!) 139/55  Pulse: (!) 121 80    Resp: (!) 23 17 11 15   Temp: 97.8 F (36.6 C) 97.6 F (36.4 C)  97.7 F (36.5 C)  TempSrc: Oral Oral  Oral  SpO2: 99% 98%  96%  Weight:      Height:       Physical Exam General:NAD, obese, pleasant female sitting in chair in shower Heart:RRR, no mrg Lungs:CTAB Abdomen:obese, +BS, NTND Extremities:1+ LE edema Dialysis Access: R IJ TDC, LU AVF maturing +b/t  Filed Weights   01/07/18 1330 01/07/18 1750 01/08/18 0347  Weight: 89.4 kg (197 lb 1.5 oz) 86.9 kg (191 lb 9.3 oz) 86.4 kg (190 lb 8 oz)    Intake/Output Summary (Last 24 hours) at 01/09/2018 1109 Last data filed at 01/09/2018 0300 Gross per 24 hour  Intake 1080 ml  Output 2 ml  Net 1078 ml    Additional Objective Labs: Basic Metabolic Panel: Recent Labs  Lab 01/07/18 0632 01/07/18 1424 01/08/18 0651  NA 135 132* 135  K 2.6* 3.2* 3.6  CL 100* 97* 99*  CO2 23 24 26   GLUCOSE 86 114* 86  BUN 20 22* 11  CREATININE 2.38* 2.66* 1.77*  CALCIUM 7.6* 7.7* 8.1*  PHOS 3.5 3.7 3.0   Liver Function Tests: Recent Labs  Lab 01/06/18 1806 01/07/18 0632 01/07/18 1424 01/08/18 0651  AST 13* 11*  --   --   ALT 10* 8*  --   --   ALKPHOS 97 87  --   --   BILITOT 0.5 0.5  --   --   PROT 5.4* 4.8*  --   --   ALBUMIN 2.4* 2.2* 2.4* 2.1*   Recent Labs  Lab 01/06/18 1806  LIPASE 22   CBC: Recent Labs  Lab 01/06/18 1806 01/07/18 0632 01/08/18 0651  WBC 12.6* 12.1* 11.5*  NEUTROABS 8.0*  --   --   HGB 10.7* 10.2* 10.3*  HCT 33.5* 31.2* 31.8*  MCV 96.0 95.1 97.8  PLT 421* 372 375   Blood Culture    Component Value Date/Time   SDES BLOOD LEFT HAND 10/19/2017 1500   SPECREQUEST  10/19/2017 1500    BOTTLES DRAWN AEROBIC AND ANAEROBIC Blood  Culture adequate volume   CULT NO GROWTH 5 DAYS 10/19/2017 1500   REPTSTATUS 10/24/2017 FINAL 10/19/2017 1500    Cardiac Enzymes: Recent Labs  Lab 01/07/18 0352 01/07/18 1019  TROPONINI <0.03 0.07*   Lab Results  Component Value Date   INR 3.85 01/09/2018   INR 4.11 (HH) 01/08/2018   INR 4.73 (HH) 01/07/2018   Studies/Results: No results found.  Medications: . sodium chloride     . atorvastatin  20 mg Oral q1800  . calcitRIOL  0.75 mcg Oral Daily  . carvedilol  12.5 mg Oral BID WC  . Chlorhexidine Gluconate Cloth  6 each Topical Q0600  . cholestyramine light  4 g Oral BID  . clopidogrel  75 mg Oral Q breakfast  . famotidine  20 mg Oral Daily  . feeding supplement (NEPRO CARB STEADY)  237 mL Oral TID AC  . multivitamin  1 tablet Oral QHS  . mupirocin ointment  1 application Nasal BID  . saccharomyces boulardii  250 mg Oral BID  . sodium  chloride flush  3 mL Intravenous Q12H  . vancomycin  125 mg Oral QID  . Warfarin - Pharmacist Dosing Inpatient   Does not apply q1800    Dialysis Orders: [facility] 4h  400/800  88.5kg  2/2.25 bath  Hep none  R IJ TDC/ LUA AVF maturing - Venofer 100mg  IV qHD x10, 8/10 completed.  - Calcitriol 0.78mcg qHD - 4/11:Hgb 10.6, K 3.4 3/28:Ca8.5, P4.4, PTH741, Albumin3.4   Assessment/Plan: 1.  C. Diff - +stool, on PO Vancomycin 2.  ESRD -  Cont HD per regular TTS schedule. Last K 3.6. Use 4K bath. 3.  Hypertension/volume  - BP ^, on carvedilol. Peripheral edema on exam, lowering EDW d/t weight loss. Continue to titrate down volume as tolerated.   4.  Anemia  - Hgb 10.3. No need for ESA at this time.  5.  Secondary Hyperparathyroidism -  CCa and P ok. No binders. Cont VDRA.  6.  Nutrition - Alb 2.1. Liberalize diet. Nepro. Renavite. 7. Systolic HF - per primary 8. DMT2 - diet controlled 9. H/o DVT - on coumadin  Jen Mow, PA-C [facility] Pager: [phone] 01/09/2018,11:09 AM  LOS: 3 days   Pt seen,  examined and agree w A/P as above.  [name] MD [employer] pager [phone]   01/09/2018, 12:41 PM
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
## 2018-01-09 NOTE — Progress Notes (Signed)
After talking with pt and her being anxious with her talking with ex husband on the phone earlier. Pt wished to be able to rest tonight.  Planned care with pt and tech. To allow time for pt to rest.  Will skip the MN vital signs  And get the 4 am closer to 5.   Will continue to monitor. Saunders Revel T

## 2018-01-10 LAB — PROTIME-INR
INR: 3.13
Prothrombin Time: 31.9 seconds — ABNORMAL HIGH (ref 11.4–15.2)

## 2018-01-10 MED ORDER — CHOLESTYRAMINE LIGHT 4 G PO PACK
4.0000 g | PACK | Freq: Three times a day (TID) | ORAL | Status: DC
  Administered 2018-01-10 – 2018-01-11 (×3): 4 g via ORAL
  Filled 2018-01-10 (×7): qty 1

## 2018-01-10 MED ORDER — WARFARIN SODIUM 5 MG PO TABS
5.0000 mg | ORAL_TABLET | Freq: Once | ORAL | Status: AC
  Administered 2018-01-10: 5 mg via ORAL
  Filled 2018-01-10: qty 1

## 2018-01-10 NOTE — Progress Notes (Signed)
ANTICOAGULATION CONSULT NOTE - Follow Up Consult  Pharmacy Consult for warfarin Indication: DVT  Allergies  Allergen Reactions  . Crestor [Rosuvastatin Calcium] Other (See Comments)    Leg pain   Patient Measurements: Height: 5\' 8"  (172.7 cm) Weight: 190 lb 8 oz (86.4 kg) IBW/kg (Calculated) : 63.9  Vital Signs: Temp: 97.5 F (36.4 C) (04/16 0821) Temp Source: Oral (04/16 0821) BP: 135/55 (04/16 0348) Pulse Rate: 78 (04/16 0348)  Labs: Recent Labs    01/07/18 1424 01/08/18 0301 01/08/18 0651 01/09/18 0301 01/10/18 0241  HGB  --   --  10.3*  --   --   HCT  --   --  31.8*  --   --   PLT  --   --  375  --   --   LABPROT  --  39.5*  --  37.5* 31.9*  INR  --  4.11*  --  3.85 3.13  CREATININE 2.66*  --  1.77*  --   --    Estimated Creatinine Clearance: 37.9 mL/min (A) (by C-G formula based on SCr of 1.77 mg/dL (H)).  Medications:  Scheduled:  . atorvastatin  20 mg Oral q1800  . calcitRIOL  0.75 mcg Oral Daily  . carvedilol  12.5 mg Oral BID WC  . Chlorhexidine Gluconate Cloth  6 each Topical Q0600  . cholestyramine light  4 g Oral BID  . clopidogrel  75 mg Oral Q breakfast  . famotidine  20 mg Oral Daily  . feeding supplement (NEPRO CARB STEADY)  237 mL Oral TID AC  . multivitamin  1 tablet Oral QHS  . mupirocin ointment  1 application Nasal BID  . saccharomyces boulardii  250 mg Oral BID  . sodium chloride flush  3 mL Intravenous Q12H  . vancomycin  125 mg Oral QID  . Warfarin - Pharmacist Dosing Inpatient   Does not apply q1800   Infusions:  . sodium chloride     Assessment: 72 YOF on warfarin PTA for DVT in s/o ESRD, admitted with Cdiff. INR 4.93 on admission, likely due to acute illness. Last warfarin dose 4/11 per med rec.   CBC is stable, no bleeding noted. INR trending down slowly, today it is 3.15. Will resume warfarin once tonight  to prevent subtherapeutic INR. The patient is completing ~50% of meals.   Home warfarin regimen: 5 mg daily  Goal of  Therapy:  INR 2-3 Monitor platelets by anticoagulation protocol: Yes   Plan:  Warfarin 5mg  x1 tonight Daily INR, CBC  Monitor s/sx of bleeding  Georga Bora, PharmD Clinical Pharmacist 01/10/2018 10:42 AM
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
## 2018-01-10 NOTE — Progress Notes (Addendum)
[facility] Progress Note   Subjective:   Stool this am with improved consistency.  Objective Vitals:   01/09/18 1806 01/09/18 1940 01/10/18 0348 01/10/18 0821  BP: (!) 113/57 (!) 147/61 (!) 135/55   Pulse: 84 (!) 111 78   Resp:      Temp: (!) 97.5 F (36.4 C) 97.7 F (36.5 C) 97.6 F (36.4 C) (!) 97.5 F (36.4 C)  TempSrc: Oral Oral Oral Oral  SpO2: 94% 97% 96%   Weight:      Height:       Physical Exam General:NAD, obese, female sitting on side of bed Heart:RRR Lungs:CTAB Abdomen:obese, +BS Extremities:1+ LE edema3 Dialysis Access: R IJ TDC, LU AVF maturing +b/t   Filed Weights   01/07/18 1330 01/07/18 1750 01/08/18 0347  Weight: 89.4 kg (197 lb 1.5 oz) 86.9 kg (191 lb 9.3 oz) 86.4 kg (190 lb 8 oz)    Intake/Output Summary (Last 24 hours) at 01/10/2018 1054 Last data filed at 01/10/2018 0942 Gross per 24 hour  Intake 480 ml  Output -  Net 480 ml    Additional Objective Labs: Basic Metabolic Panel: Recent Labs  Lab 01/07/18 0632 01/07/18 1424 01/08/18 0651  NA 135 132* 135  K 2.6* 3.2* 3.6  CL 100* 97* 99*  CO2 23 24 26   GLUCOSE 86 114* 86  BUN 20 22* 11  CREATININE 2.38* 2.66* 1.77*  CALCIUM 7.6* 7.7* 8.1*  PHOS 3.5 3.7 3.0   Liver Function Tests: Recent Labs  Lab 01/06/18 1806 01/07/18 0632 01/07/18 1424 01/08/18 0651  AST 13* 11*  --   --   ALT 10* 8*  --   --   ALKPHOS 97 87  --   --   BILITOT 0.5 0.5  --   --   PROT 5.4* 4.8*  --   --   ALBUMIN 2.4* 2.2* 2.4* 2.1*   Recent Labs  Lab 01/06/18 1806  LIPASE 22   CBC: Recent Labs  Lab 01/06/18 1806 01/07/18 0632 01/08/18 0651  WBC 12.6* 12.1* 11.5*  NEUTROABS 8.0*  --   --   HGB 10.7* 10.2* 10.3*  HCT 33.5* 31.2* 31.8*  MCV 96.0 95.1 97.8  PLT 421* 372 375   Blood Culture    Component Value Date/Time   SDES BLOOD LEFT HAND 10/19/2017 1500   SPECREQUEST  10/19/2017 1500    BOTTLES DRAWN AEROBIC AND ANAEROBIC Blood Culture adequate volume   CULT NO GROWTH  5 DAYS 10/19/2017 1500   REPTSTATUS 10/24/2017 FINAL 10/19/2017 1500    Cardiac Enzymes: Recent Labs  Lab 01/07/18 0352 01/07/18 1019  TROPONINI <0.03 0.07*   CBG: No results for input(s): GLUCAP in the last 168 hours. Iron Studies: No results for input(s): IRON, TIBC, TRANSFERRIN, FERRITIN in the last 72 hours. Lab Results  Component Value Date   INR 3.13 01/10/2018   INR 3.85 01/09/2018   INR 4.11 (HH) 01/08/2018   Studies/Results: No results found.  Medications: . sodium chloride     . atorvastatin  20 mg Oral q1800  . calcitRIOL  0.75 mcg Oral Daily  . carvedilol  12.5 mg Oral BID WC  . Chlorhexidine Gluconate Cloth  6 each Topical Q0600  . cholestyramine light  4 g Oral BID  . clopidogrel  75 mg Oral Q breakfast  . famotidine  20 mg Oral Daily  . feeding supplement (NEPRO CARB STEADY)  237 mL Oral TID AC  . multivitamin  1 tablet Oral QHS  .  mupirocin ointment  1 application Nasal BID  . saccharomyces boulardii  250 mg Oral BID  . sodium chloride flush  3 mL Intravenous Q12H  . vancomycin  125 mg Oral QID  . Warfarin - Pharmacist Dosing Inpatient   Does not apply q1800    Dialysis Orders: [facility] 4h 400/800 88.5kg 2/2.25 bath Hep none R IJ TDC/ LUA AVF maturing -Venofer 100mg  IV qHD x10, 8/10 completed. -Calcitriol 0.85mcg qHD -4/11:Hgb 10.6, K 3.4 3/28:Ca8.5, P4.4, PTH741, Albumin3.4   Assessment/Plan: 1. C. Diff - +stool, improving, on PO Vancomycin 2. ESRD - Cont HD per regular TTS schedule. Last K 3.6. Use 4K bath.  Check labs prior. 3. Hypertension/volume - BP well controlled, on carvedilol. Peripheral edema on exam, lowering EDW d/t weight loss. Continue to titrate down volume as tolerated.  4. Anemia - Hgb 10.3. No need for ESA at this time. Checking labs prior to HD. 5. Secondary Hyperparathyroidism - CCa and P ok. No binders. Cont VDRA. 6. Nutrition - Alb 2.1. Liberalize diet. Nepro. Renavite. 7. Systolic HF - per  primary 8. DMT2 - diet controlled 9. H/o DVT - on coumadin     Jen Mow, PA-C [facility] Pager: [phone] 01/10/2018,10:54 AM  LOS: 4 days   Pt seen, examined and agree w A/P as above.  [name] MD [employer] pager [phone]   01/10/2018, 1:44 PM
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
## 2018-01-11 LAB — CBC
HCT: 28.4 % — ABNORMAL LOW (ref 36.0–46.0)
Hemoglobin: 9.2 g/dL — ABNORMAL LOW (ref 12.0–15.0)
MCH: 30.5 pg (ref 26.0–34.0)
MCHC: 32.4 g/dL (ref 30.0–36.0)
MCV: 94 fL (ref 78.0–100.0)
Platelets: 361 10*3/uL (ref 150–400)
RBC: 3.02 MIL/uL — ABNORMAL LOW (ref 3.87–5.11)
RDW: 17.3 % — ABNORMAL HIGH (ref 11.5–15.5)
WBC: 8.1 10*3/uL (ref 4.0–10.5)

## 2018-01-11 LAB — RENAL FUNCTION PANEL
Albumin: 2.2 g/dL — ABNORMAL LOW (ref 3.5–5.0)
Anion gap: 9 (ref 5–15)
BUN: 37 mg/dL — ABNORMAL HIGH (ref 6–20)
CO2: 22 mmol/L (ref 22–32)
Calcium: 8 mg/dL — ABNORMAL LOW (ref 8.9–10.3)
Chloride: 97 mmol/L — ABNORMAL LOW (ref 101–111)
Creatinine, Ser: 3.48 mg/dL — ABNORMAL HIGH (ref 0.44–1.00)
GFR calc Af Amer: 15 mL/min — ABNORMAL LOW (ref >60)
GFR calc non Af Amer: 13 mL/min — ABNORMAL LOW (ref >60)
Glucose, Bld: 103 mg/dL — ABNORMAL HIGH (ref 65–99)
Phosphorus: 3.5 mg/dL (ref 2.5–4.6)
Potassium: 4.3 mmol/L (ref 3.5–5.1)
Sodium: 128 mmol/L — ABNORMAL LOW (ref 135–145)

## 2018-01-11 LAB — GLUCOSE, CAPILLARY: Glucose-Capillary: 122 mg/dL — ABNORMAL HIGH (ref 65–99)

## 2018-01-11 LAB — PROTIME-INR
INR: 2.24
Prothrombin Time: 24.6 seconds — ABNORMAL HIGH (ref 11.4–15.2)

## 2018-01-11 MED ORDER — ALTEPLASE 2 MG IJ SOLR: 2.0000 mg | Freq: Once | INTRAMUSCULAR | Status: DC | PRN

## 2018-01-11 MED ORDER — WARFARIN SODIUM 5 MG PO TABS
5.0000 mg | ORAL_TABLET | Freq: Once | ORAL | Status: AC
  Administered 2018-01-11: 5 mg via ORAL
  Filled 2018-01-11: qty 1

## 2018-01-11 MED ORDER — PENTAFLUOROPROP-TETRAFLUOROETH EX AERO: 1.0000 "application " | INHALATION_SPRAY | CUTANEOUS | Status: DC | PRN

## 2018-01-11 MED ORDER — SODIUM CHLORIDE 0.9 % IV SOLN: 100.0000 mL | INTRAVENOUS | Status: DC | PRN

## 2018-01-11 MED ORDER — HEPARIN SODIUM (PORCINE) 1000 UNIT/ML DIALYSIS: 1000.0000 [IU] | INTRAMUSCULAR | Status: DC | PRN

## 2018-01-11 MED ORDER — LIDOCAINE HCL (PF) 1 % IJ SOLN: 5.0000 mL | INTRAMUSCULAR | Status: DC | PRN

## 2018-01-11 MED ORDER — DARBEPOETIN ALFA 60 MCG/0.3ML IJ SOSY
PREFILLED_SYRINGE | INTRAMUSCULAR | Status: AC
  Administered 2018-01-11: 60 ug via INTRAVENOUS
  Filled 2018-01-11: qty 0.3

## 2018-01-11 MED ORDER — LIDOCAINE-PRILOCAINE 2.5-2.5 % EX CREA: 1.0000 "application " | TOPICAL_CREAM | CUTANEOUS | Status: DC | PRN

## 2018-01-11 MED ORDER — DARBEPOETIN ALFA 60 MCG/0.3ML IJ SOSY
60.0000 ug | PREFILLED_SYRINGE | INTRAMUSCULAR | Status: DC
  Administered 2018-01-11: 60 ug via INTRAVENOUS
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
## 2018-01-11 NOTE — Progress Notes (Addendum)
[facility] Progress Note   Subjective:   Seen on HD. Reports con't improvement in consistency of BM.  Objective Vitals:   01/11/18 0800 01/11/18 0810 01/11/18 0815 01/11/18 0830  BP: (!) 187/42 (!) 168/68 (!) 180/68 (!) 138/43  Pulse: 82 78 79 80  Resp: 19  18   Temp: (!) 97.5 F (36.4 C)     TempSrc: Oral     SpO2: 95%     Weight: 90.7 kg (199 lb 15.3 oz)     Height:       Physical Exam General:NAD, WDWN, pleasant female, laying in bed Heart:RRR, no mrg Lungs:CTAB Abdomen:soft, NTND, +BS Extremities:1+ LE edema R>L Dialysis Access: TDC in use, LU AVF +b/t   Filed Weights   01/07/18 1750 01/08/18 0347 01/11/18 0800  Weight: 86.9 kg (191 lb 9.3 oz) 86.4 kg (190 lb 8 oz) 90.7 kg (199 lb 15.3 oz)    Intake/Output Summary (Last 24 hours) at 01/11/2018 0836 Last data filed at 01/10/2018 1827 Gross per 24 hour  Intake 480 ml  Output -  Net 480 ml    Additional Objective Labs: Basic Metabolic Panel: Recent Labs  Lab 01/07/18 1424 01/08/18 0651 01/11/18 0023  NA 132* 135 128*  K 3.2* 3.6 4.3  CL 97* 99* 97*  CO2 24 26 22   GLUCOSE 114* 86 103*  BUN 22* 11 37*  CREATININE 2.66* 1.77* 3.48*  CALCIUM 7.7* 8.1* 8.0*  PHOS 3.7 3.0 3.5   Liver Function Tests: Recent Labs  Lab 01/06/18 1806 01/07/18 0632 01/07/18 1424 01/08/18 0651 01/11/18 0023  AST 13* 11*  --   --   --   ALT 10* 8*  --   --   --   ALKPHOS 97 87  --   --   --   BILITOT 0.5 0.5  --   --   --   PROT 5.4* 4.8*  --   --   --   ALBUMIN 2.4* 2.2* 2.4* 2.1* 2.2*   Recent Labs  Lab 01/06/18 1806  LIPASE 22   CBC: Recent Labs  Lab 01/06/18 1806 01/07/18 0632 01/08/18 0651 01/11/18 0023  WBC 12.6* 12.1* 11.5* 8.1  NEUTROABS 8.0*  --   --   --   HGB 10.7* 10.2* 10.3* 9.2*  HCT 33.5* 31.2* 31.8* 28.4*  MCV 96.0 95.1 97.8 94.0  PLT 421* 372 375 361   Cardiac Enzymes: Recent Labs  Lab 01/07/18 0352 01/07/18 1019  TROPONINI <0.03 0.07*    Lab Results  Component  Value Date   INR 2.24 01/11/2018   INR 3.13 01/10/2018   INR 3.85 01/09/2018   Studies/Results: No results found.  Medications: . sodium chloride     . atorvastatin  20 mg Oral q1800  . calcitRIOL  0.75 mcg Oral Daily  . carvedilol  12.5 mg Oral BID WC  . Chlorhexidine Gluconate Cloth  6 each Topical Q0600  . cholestyramine light  4 g Oral TID  . clopidogrel  75 mg Oral Q breakfast  . famotidine  20 mg Oral Daily  . feeding supplement (NEPRO CARB STEADY)  237 mL Oral TID AC  . multivitamin  1 tablet Oral QHS  . mupirocin ointment  1 application Nasal BID  . saccharomyces boulardii  250 mg Oral BID  . sodium chloride flush  3 mL Intravenous Q12H  . vancomycin  125 mg Oral QID  . Warfarin - Pharmacist Dosing Inpatient   Does not apply [phone]  Dialysis Orders: [facility] 4h 400/800 88.5kg 2/2.25 bath Hep none R IJ TDC/ LUA AVF maturing -Venofer 100mg  IV qHD x10, 8/10 completed. -Calcitriol 0.39mcg qHD -4/11:Hgb 10.6, K 3.4 3/28:Ca8.5, P4.4, PTH741, Albumin3.4   Assessment/Plan: 1. C. Diff - +stool, improving, onPOVancomycin, cholestyramine & probiotics 2. ESRD -HD delayed to this AM due to acute patiens.  Currently tolerating well.  Orders written for tomorrow if remains inpatient. K improving, 4.3. 3. Hypertension/volume -BP elevated this am, likely due to increased volume, on carvedilol. Peripheral edema on exam, lowering EDW d/t weight loss. Net UF goal 3L today. 4. Anemia - Hgb 9.2. Give dose ESA today, 72mcg aranesp  5. Secondary Hyperparathyroidism - CCa and P ok. No binders. Cont VDRA. 6. Nutrition - Alb 2.2. Liberalize diet. Nepro. Renavite. 7. Systolic HF - per primary 8. DMT2 - diet controlled 9. H/o DVT - on coumadin   Jen Mow, PA-C [facility] Pager: [phone] 01/11/2018,8:36 AM  LOS: 5 days   Pt seen, examined and agree w A/P as above.  [name] MD [employer] pager [phone]    01/11/2018, 12:45 PM

## 2018-01-11 NOTE — Progress Notes (Signed)
Due to pt. Having dialysis this morning. Prevalite not given and will be given at 16:00. All meds must be take 4 hours afterwards, Nighttime meds re-timed accordingly.

## 2018-01-11 NOTE — Progress Notes (Signed)
PT Cancellation Note  Patient Details Name: Kristen Berry MRN: [id number] DOB: 06/07/1956   Cancelled Treatment:    Reason Eval/Treat Not Completed: Patient at procedure or test/unavailable. Pt at HD. Acute PT to return as able.  Kittie Plater, PT, DPT Pager #: [phone] Office #: [phone]    [place] 01/11/2018, 8:52 AM

## 2018-01-11 NOTE — Progress Notes (Signed)
ANTICOAGULATION CONSULT NOTE - Follow Up Consult  Pharmacy Consult for warfarin Indication: DVT  Allergies  Allergen Reactions  . Crestor [Rosuvastatin Calcium] Other (See Comments)    Leg pain   Patient Measurements: Height: 5\' 8"  (172.7 cm) Weight: 192 lb 14.4 oz (87.5 kg) IBW/kg (Calculated) : 63.9  Vital Signs: Temp: 98 F (36.7 C) (04/17 1300) Temp Source: Axillary (04/17 1300) BP: 134/61 (04/17 1300) Pulse Rate: 112 (04/17 1300)  Labs: Recent Labs    01/09/18 0301 01/10/18 0241 01/11/18 0023  HGB  --   --  9.2*  HCT  --   --  28.4*  PLT  --   --  361  LABPROT 37.5* 31.9* 24.6*  INR 3.85 3.13 2.24  CREATININE  --   --  3.48*   Estimated Creatinine Clearance: 19.4 mL/min (A) (by C-G formula based on SCr of 3.48 mg/dL (H)).  Medications:  Scheduled:  . atorvastatin  20 mg Oral q1800  . calcitRIOL  0.Kristen mcg Oral Daily  . carvedilol  12.5 mg Oral BID WC  . Chlorhexidine Gluconate Cloth  6 each Topical Q0600  . cholestyramine light  4 g Oral TID  . clopidogrel  Kristen mg Oral Q breakfast  . darbepoetin (ARANESP) injection - DIALYSIS  60 mcg Intravenous Q Wed-HD  . famotidine  20 mg Oral Daily  . feeding supplement (NEPRO CARB STEADY)  237 mL Oral TID AC  . multivitamin  1 tablet Oral QHS  . mupirocin ointment  1 application Nasal BID  . saccharomyces boulardii  250 mg Oral BID  . sodium chloride flush  3 mL Intravenous Q12H  . vancomycin  125 mg Oral QID  . Warfarin - Pharmacist Dosing Inpatient   Does not apply q1800   Infusions:  . sodium chloride     Assessment: Kristen Berry on warfarin PTA for DVT in s/o ESRD, admitted with Cdiff. INR 4.93 on admission, likely due to c diff.  -INR = 2.24 with trend down   Home warfarin regimen: 5 mg daily  Goal of Therapy:  INR 2-3 Monitor platelets by anticoagulation protocol: Yes   Plan:  Warfarin 5mg  x1 tonight Daily INR, CBC   Hildred Laser, PharmD Clinical Pharmacist Clinical phone from 8:30-4:00 is  [phone] After 4pm, please call Main Rx [phone]) for assistance. 01/11/2018 3:55 PM

## 2018-01-12 DIAGNOSIS — A0472 Enterocolitis due to Clostridium difficile, not specified as recurrent: Principal | ICD-10-CM

## 2018-01-12 DIAGNOSIS — I10 Essential (primary) hypertension: Secondary | ICD-10-CM

## 2018-01-12 DIAGNOSIS — E876 Hypokalemia: Secondary | ICD-10-CM

## 2018-01-12 DIAGNOSIS — N186 End stage renal disease: Secondary | ICD-10-CM

## 2018-01-12 DIAGNOSIS — I953 Hypotension of hemodialysis: Secondary | ICD-10-CM

## 2018-01-12 LAB — RENAL FUNCTION PANEL
Albumin: 2.4 g/dL — ABNORMAL LOW (ref 3.5–5.0)
Anion gap: 10 (ref 5–15)
BUN: 19 mg/dL (ref 6–20)
CO2: 24 mmol/L (ref 22–32)
Calcium: 8.4 mg/dL — ABNORMAL LOW (ref 8.9–10.3)
Chloride: 97 mmol/L — ABNORMAL LOW (ref 101–111)
Creatinine, Ser: 2.57 mg/dL — ABNORMAL HIGH (ref 0.44–1.00)
GFR calc Af Amer: 22 mL/min — ABNORMAL LOW (ref >60)
GFR calc non Af Amer: 19 mL/min — ABNORMAL LOW (ref >60)
Glucose, Bld: 153 mg/dL — ABNORMAL HIGH (ref 65–99)
Phosphorus: 3.4 mg/dL (ref 2.5–4.6)
Potassium: 3.7 mmol/L (ref 3.5–5.1)
Sodium: 131 mmol/L — ABNORMAL LOW (ref 135–145)

## 2018-01-12 LAB — CBC
HCT: 29.1 % — ABNORMAL LOW (ref 36.0–46.0)
Hemoglobin: 9.5 g/dL — ABNORMAL LOW (ref 12.0–15.0)
MCH: 31 pg (ref 26.0–34.0)
MCHC: 32.6 g/dL (ref 30.0–36.0)
MCV: 95.1 fL (ref 78.0–100.0)
Platelets: 331 10*3/uL (ref 150–400)
RBC: 3.06 MIL/uL — ABNORMAL LOW (ref 3.87–5.11)
RDW: 17.6 % — ABNORMAL HIGH (ref 11.5–15.5)
WBC: 10.4 10*3/uL (ref 4.0–10.5)

## 2018-01-12 LAB — PROTIME-INR
INR: 1.86
Prothrombin Time: 21.3 seconds — ABNORMAL HIGH (ref 11.4–15.2)

## 2018-01-12 MED ORDER — WARFARIN SODIUM 7.5 MG PO TABS
7.5000 mg | ORAL_TABLET | Freq: Once | ORAL | Status: AC
  Administered 2018-01-12: 7.5 mg via ORAL
  Filled 2018-01-12: qty 1

## 2018-01-12 MED ORDER — PENTAFLUOROPROP-TETRAFLUOROETH EX AERO: 1.0000 "application " | INHALATION_SPRAY | CUTANEOUS | Status: DC | PRN

## 2018-01-12 MED ORDER — SACCHAROMYCES BOULARDII 250 MG PO CAPS: 250.0000 mg | ORAL_CAPSULE | Freq: Two times a day (BID) | ORAL | 0 refills | Status: AC

## 2018-01-12 MED ORDER — LIDOCAINE HCL (PF) 1 % IJ SOLN: 5.0000 mL | INTRAMUSCULAR | Status: DC | PRN

## 2018-01-12 MED ORDER — SACCHAROMYCES BOULARDII 250 MG PO CAPS: 250.0000 mg | ORAL_CAPSULE | Freq: Two times a day (BID) | ORAL | 0 refills | Status: DC

## 2018-01-12 MED ORDER — VANCOMYCIN 50 MG/ML ORAL SOLUTION: 125.0000 mg | Freq: Four times a day (QID) | ORAL | 0 refills | Status: DC

## 2018-01-12 MED ORDER — SODIUM CHLORIDE 0.9 % IV SOLN: 100.0000 mL | INTRAVENOUS | Status: DC | PRN

## 2018-01-12 MED ORDER — ALTEPLASE 2 MG IJ SOLR: 2.0000 mg | Freq: Once | INTRAMUSCULAR | Status: DC | PRN

## 2018-01-12 MED ORDER — HEPARIN SODIUM (PORCINE) 1000 UNIT/ML DIALYSIS: 1000.0000 [IU] | INTRAMUSCULAR | Status: DC | PRN

## 2018-01-12 MED ORDER — LIDOCAINE-PRILOCAINE 2.5-2.5 % EX CREA: 1.0000 "application " | TOPICAL_CREAM | CUTANEOUS | Status: DC | PRN

## 2018-01-12 MED ORDER — NEPRO/CARBSTEADY PO LIQD
237.0000 mL | Freq: Two times a day (BID) | ORAL | Status: DC
  Filled 2018-01-12 (×3): qty 237

## 2018-01-12 NOTE — Progress Notes (Signed)
Nutrition Follow-up  DOCUMENTATION CODES:   Not applicable  INTERVENTION:   -Decrease Nepro Shake po to BID, each supplement provides 425 kcal and 19 grams protein -Continue renal MVI daily  NUTRITION DIAGNOSIS:   Inadequate oral intake related to altered GI function, decreased appetite as evidenced by per patient/family report.  Ongoing  GOAL:   Patient will meet greater than or equal to 90% of their needs  Progressing  MONITOR:   PO intake, Supplement acceptance, Labs, Weight trends, Skin, I & O's  REASON FOR ASSESSMENT:   Malnutrition Screening Tool    ASSESSMENT:   62 y.o. 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 Case discussed with nurse tech, who reports intake is improving. Per nurse tech, pt consumed 100% of breakfast this morning. Documented meal intake 50-80%, which is a vast improvement since last visit.   Pt refusing some Nepro supplements; now consuming 1-2 per day on average. Noted pt refused morning dose. Will decrease frequency of supplements given improved intake.   Per chart review, pt with decreased loose stool frequency and will likely discharge back home soon.   Labs reviewed: Na: 128, CBGS: 122.  Diet Order:  Diet heart healthy/carb modified Room service appropriate? Yes; Fluid consistency: Thin; Fluid restriction: 1200 mL Fluid  EDUCATION NEEDS:   Education needs have been addressed  Skin:  Skin Assessment: Skin Integrity Issues: Skin Integrity Issues:: Other (Comment) Other: MASD buttocks, groin, breast  Last BM:  01/11/18  Height:   Ht Readings from Last 1 Encounters:  01/07/18 5\' 8"  (1.727 m)    Weight:   Wt Readings from Last 1 Encounters:  01/11/18 192 lb 14.4 oz (87.5 kg)    Ideal Body Weight:  63.6 kg  BMI:   Body mass index is 29.33 kg/m.  Estimated Nutritional Needs:   Kcal:  1700-1900  Protein:  85-100 grams  Fluid:  per MD    Lydie Stammen A. [name], RD, LDN, CDE Pager: [phone] After hours Pager: [phone]

## 2018-01-12 NOTE — Discharge Instructions (Signed)
Follow with Martinique, Sarah T, MD in 5-7 days  Please get a complete blood count and chemistry panel checked by your Primary MD at your next visit, and again as instructed by your Primary MD. Please get your medications reviewed and adjusted by your Primary MD.  Please request your Primary MD to go over all Hospital Tests and Procedure/Radiological results at the follow up, please get all Hospital records sent to your Prim MD by signing hospital release before you go home.  If you had Pneumonia of Lung problems at the Hospital: Please get a 2 view Chest X ray done in 6-8 weeks after hospital discharge or sooner if instructed by your Primary MD.  If you have Congestive Heart Failure: Please call your Cardiologist or Primary MD anytime you have any of the following symptoms:  1) 3 pound weight gain in 24 hours or 5 pounds in 1 week  2) shortness of breath, with or without a dry hacking cough  3) swelling in the hands, feet or stomach  4) if you have to sleep on extra pillows at night in order to breathe  Follow cardiac low salt diet and 1.5 lit/day fluid restriction.  If you have diabetes Accuchecks 4 times/day, Once in AM empty stomach and then before each meal. Log in all results and show them to your primary doctor at your next visit. If any glucose reading is under 80 or above 300 call your primary MD immediately.  If you have Seizure/Convulsions/Epilepsy: Please do not drive, operate heavy machinery, participate in activities at heights or participate in high speed sports until you have seen by Primary MD or a Neurologist and advised to do so again.  If you had Gastrointestinal Bleeding: Please ask your Primary MD to check a complete blood count within one week of discharge or at your next visit. Your endoscopic/colonoscopic biopsies that are pending at the time of discharge, will also need to followed by your Primary MD.  Get Medicines reviewed and adjusted. Please take all your  medications with you for your next visit with your Primary MD  Please request your Primary MD to go over all hospital tests and procedure/radiological results at the follow up, please ask your Primary MD to get all Hospital records sent to his/her office.  If you experience worsening of your admission symptoms, develop shortness of breath, life threatening emergency, suicidal or homicidal thoughts you must seek medical attention immediately by calling 911 or calling your MD immediately  if symptoms less severe.  You must read complete instructions/literature along with all the possible adverse reactions/side effects for all the Medicines you take and that have been prescribed to you. Take any new Medicines after you have completely understood and accpet all the possible adverse reactions/side effects.   Do not drive or operate heavy machinery when taking Pain medications.   Do not take more than prescribed Pain, Sleep and Anxiety Medications  Special Instructions: If you have smoked or chewed Tobacco  in the last 2 yrs please stop smoking, stop any regular Alcohol  and or any Recreational drug use.  Wear Seat belts while driving.  Please note You were cared for by a hospitalist during your hospital stay. If you have any questions about your discharge medications or the care you received while you were in the hospital after you are discharged, you can call the unit and asked to speak with the hospitalist on call if the hospitalist that took care of you is not available.  Once you are discharged, your primary care physician will handle any further medical issues. Please note that NO REFILLS for any discharge medications will be authorized once you are discharged, as it is imperative that you return to your primary care physician (or establish a relationship with a primary care physician if you do not have one) for your aftercare needs so that they can reassess your need for medications and monitor your  lab values.  You can reach the hospitalist office at phone [phone] or fax [phone]   If you do not have a primary care physician, you can call [phone] for a physician referral.  Activity: As tolerated with Full fall precautions use walker/cane & assistance as needed  Diet: renal  Disposition Home

## 2018-01-12 NOTE — Progress Notes (Deleted)
Came to receive pt. From [facility]. When I entered the room pt. Stated he wanted to go home. AMA paperwork was explained and Dr. Eliseo Squires was called and aware. Pt. On the phone with wife. Waiting for pt. To sign paperwork or decide to stay.
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
## 2018-01-12 NOTE — Progress Notes (Signed)
[facility] Progress Note   Subjective:   Feeling better, ready to go home.  Only 2 BM yesterday.  Objective Vitals:   01/11/18 1953 01/11/18 2109 01/12/18 0333 01/12/18 0827  BP: 93/64 134/77 (!) 147/47 (!) 148/48  Pulse:  (!) 55 76 70  Resp:      Temp: 97.7 F (36.5 C)  97.7 F (36.5 C) (!) 97.5 F (36.4 C)  TempSrc: Oral  Oral Oral  SpO2:   100%   Weight:      Height:       Physical Exam General:NAD, WDWN female sitting on side of bed Heart:RRR Lungs:CTAB Abdomen:soft, NTND Extremities:trace edema Dialysis Access: TDC, LU AVF +b/t   Filed Weights   01/08/18 0347 01/11/18 0800 01/11/18 1211  Weight: 86.4 kg (190 lb 8 oz) 90.7 kg (199 lb 15.3 oz) 87.5 kg (192 lb 14.4 oz)    Intake/Output Summary (Last 24 hours) at 01/12/2018 1035 Last data filed at 01/12/2018 1027 Gross per 24 hour  Intake 120 ml  Output 2790 ml  Net -2670 ml    Additional Objective Labs: Basic Metabolic Panel: Recent Labs  Lab 01/07/18 1424 01/08/18 0651 01/11/18 0023  NA 132* 135 128*  K 3.2* 3.6 4.3  CL 97* 99* 97*  CO2 24 26 22   GLUCOSE 114* 86 103*  BUN 22* 11 37*  CREATININE 2.66* 1.77* 3.48*  CALCIUM 7.7* 8.1* 8.0*  PHOS 3.7 3.0 3.5   Liver Function Tests: Recent Labs  Lab 01/06/18 1806 01/07/18 0632 01/07/18 1424 01/08/18 0651 01/11/18 0023  AST 13* 11*  --   --   --   ALT 10* 8*  --   --   --   ALKPHOS 97 87  --   --   --   BILITOT 0.5 0.5  --   --   --   PROT 5.4* 4.8*  --   --   --   ALBUMIN 2.4* 2.2* 2.4* 2.1* 2.2*   Recent Labs  Lab 01/06/18 1806  LIPASE 22   CBC: Recent Labs  Lab 01/06/18 1806 01/07/18 0632 01/08/18 0651 01/11/18 0023  WBC 12.6* 12.1* 11.5* 8.1  NEUTROABS 8.0*  --   --   --   HGB 10.7* 10.2* 10.3* 9.2*  HCT 33.5* 31.2* 31.8* 28.4*  MCV 96.0 95.1 97.8 94.0  PLT 421* 372 375 361   Blood Culture    Component Value Date/Time   SDES BLOOD LEFT HAND 10/19/2017 1500   SPECREQUEST  10/19/2017 1500    BOTTLES DRAWN  AEROBIC AND ANAEROBIC Blood Culture adequate volume   CULT NO GROWTH 5 DAYS 10/19/2017 1500   REPTSTATUS 10/24/2017 FINAL 10/19/2017 1500    Cardiac Enzymes: Recent Labs  Lab 01/07/18 0352 01/07/18 1019  TROPONINI <0.03 0.07*   CBG: Recent Labs  Lab 01/11/18 1336  GLUCAP 122*   Lab Results  Component Value Date   INR 1.86 01/12/2018   INR 2.24 01/11/2018   INR 3.13 01/10/2018   Studies/Results: No results found.  Medications: . sodium chloride     . atorvastatin  20 mg Oral q1800  . calcitRIOL  0.75 mcg Oral Daily  . carvedilol  12.5 mg Oral BID WC  . cholestyramine light  4 g Oral TID  . clopidogrel  75 mg Oral Q breakfast  . darbepoetin (ARANESP) injection - DIALYSIS  60 mcg Intravenous Q Wed-HD  . famotidine  20 mg Oral Daily  . feeding supplement (NEPRO CARB STEADY)  237 mL  Oral TID AC  . multivitamin  1 tablet Oral QHS  . mupirocin ointment  1 application Nasal BID  . saccharomyces boulardii  250 mg Oral BID  . sodium chloride flush  3 mL Intravenous Q12H  . vancomycin  125 mg Oral QID  . Warfarin - Pharmacist Dosing Inpatient   Does not apply q1800    Dialysis Orders: [facility] 4h 400/800 88.5kg 2/2.25 bath Hep none R IJ TDC/ LUA AVF maturing -Venofer 100mg  IV qHD x10, 8/10 completed. -Calcitriol 0.86mcg qHD -4/11:Hgb 10.6, K 3.4 3/28:Ca8.5, P4.4, PTH741, Albumin3.4   Assessment/Plan: 1. C. Diff - +stool, improving,onPOVancomycin, cholestyramine & probiotics 2. ESRD -HD today per regular schedule, then d/c home after per primary. 3. Hypertension/volume -BP mostly well controlled, improved post HD, on carvedilol. Net UF 2.8L removed yesterday.  Titrate down as tolerated. Standing weights.  Will need new EDW at d/c. 4. Anemia - Hgb 9.2. 72mcg aranesp started yesterday. 5. Secondary Hyperparathyroidism - CCa and P ok. No binders. Cont VDRA. 6. Nutrition - Alb 2.2. Liberalize diet. Nepro. Renavite. 7. Systolic HF - per  primary 8. DMT2 - diet controlled 9. H/o DVT - on coumadin     Jen Mow, PA-C [facility] Pager: [phone] 01/12/2018,10:35 AM  LOS: 6 days

## 2018-01-12 NOTE — Progress Notes (Signed)
PT Cancellation Note  Patient Details Name: Kristen Berry MRN: [id number] DOB: Aug 04, 1956   Cancelled Treatment:    Reason Eval/Treat Not Completed: Patient at procedure or test/unavailable, HD   Kristen Berry Kristen Berry 01/12/2018, 12:17 PM  Kristen Berry, [place]

## 2018-01-12 NOTE — Progress Notes (Signed)
Clinical Social Worker was consulted by patients RN to assist patient for transportation need. Per RN family is unable to pick up patient from the hospital. Surveyor, quantity of Social Work gave CSW verbal permission to assist patient with cab voucher to [employer]. Cab voucher was given to RN to contact cab company once pateint is dressed and ready. CSW signing off as patients needs have been met.   Rhea Pink, MSW,  [place]

## 2018-01-12 NOTE — Plan of Care (Signed)
Pt. Adequate for discharge. All discharge paperwork given to patient and patient verbalized understanding. Pt. Taken home via taxi. All belongings with patient.
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
## 2018-01-13 DIAGNOSIS — G459 Transient cerebral ischemic attack, unspecified: Secondary | ICD-10-CM | POA: Insufficient documentation

## 2018-01-13 DIAGNOSIS — Z7901 Long term (current) use of anticoagulants: Secondary | ICD-10-CM | POA: Insufficient documentation

## 2018-01-13 DIAGNOSIS — R0781 Pleurodynia: Secondary | ICD-10-CM | POA: Insufficient documentation

## 2018-01-20 ENCOUNTER — Encounter: Payer: Self-pay | Admitting: Vascular Surgery

## 2018-01-20 ENCOUNTER — Ambulatory Visit (INDEPENDENT_AMBULATORY_CARE_PROVIDER_SITE_OTHER)
Admission: RE | Admit: 2018-01-20 | Discharge: 2018-01-20 | Disposition: A | Discharge status: Home / Self Care | Payer: Medicare HMO | Source: Ambulatory Visit | Attending: Vascular Surgery | Admitting: Vascular Surgery

## 2018-01-20 ENCOUNTER — Ambulatory Visit (INDEPENDENT_AMBULATORY_CARE_PROVIDER_SITE_OTHER): Payer: Self-pay | Admitting: Vascular Surgery

## 2018-01-20 ENCOUNTER — Other Ambulatory Visit: Payer: Self-pay | Admitting: *Deleted

## 2018-01-20 ENCOUNTER — Ambulatory Visit (HOSPITAL_COMMUNITY)
Admission: RE | Admit: 2018-01-20 | Discharge: 2018-01-20 | Disposition: A | Discharge status: Home / Self Care | Payer: Medicare HMO | Source: Ambulatory Visit | Attending: Vascular Surgery | Admitting: Vascular Surgery

## 2018-01-20 ENCOUNTER — Ambulatory Visit: Payer: Self-pay | Admitting: Vascular Surgery

## 2018-01-20 ENCOUNTER — Encounter: Payer: Self-pay | Admitting: *Deleted

## 2018-01-20 ENCOUNTER — Other Ambulatory Visit: Payer: Self-pay

## 2018-01-20 VITALS — BP 116/60 | HR 75 | Temp 97.0°F | Resp 20 | Ht 68.0 in | Wt 187.0 lb

## 2018-01-20 DIAGNOSIS — N289 Disorder of kidney and ureter, unspecified: Secondary | ICD-10-CM | POA: Insufficient documentation

## 2018-01-20 DIAGNOSIS — E1122 Type 2 diabetes mellitus with diabetic chronic kidney disease: Secondary | ICD-10-CM | POA: Diagnosis not present

## 2018-01-20 DIAGNOSIS — E785 Hyperlipidemia, unspecified: Secondary | ICD-10-CM | POA: Insufficient documentation

## 2018-01-20 DIAGNOSIS — N184 Chronic kidney disease, stage 4 (severe): Secondary | ICD-10-CM | POA: Insufficient documentation

## 2018-01-20 DIAGNOSIS — Z95828 Presence of other vascular implants and grafts: Secondary | ICD-10-CM | POA: Insufficient documentation

## 2018-01-20 DIAGNOSIS — N186 End stage renal disease: Secondary | ICD-10-CM

## 2018-01-20 DIAGNOSIS — Z992 Dependence on renal dialysis: Secondary | ICD-10-CM

## 2018-01-20 DIAGNOSIS — Z48812 Encounter for surgical aftercare following surgery on the circulatory system: Secondary | ICD-10-CM | POA: Diagnosis not present

## 2018-01-20 DIAGNOSIS — I2510 Atherosclerotic heart disease of native coronary artery without angina pectoris: Secondary | ICD-10-CM | POA: Diagnosis not present

## 2018-01-20 DIAGNOSIS — I739 Peripheral vascular disease, unspecified: Secondary | ICD-10-CM | POA: Insufficient documentation

## 2018-01-20 DIAGNOSIS — M79605 Pain in left leg: Secondary | ICD-10-CM | POA: Insufficient documentation

## 2018-01-20 DIAGNOSIS — E1151 Type 2 diabetes mellitus with diabetic peripheral angiopathy without gangrene: Secondary | ICD-10-CM | POA: Insufficient documentation

## 2018-01-20 DIAGNOSIS — I129 Hypertensive chronic kidney disease with stage 1 through stage 4 chronic kidney disease, or unspecified chronic kidney disease: Secondary | ICD-10-CM | POA: Insufficient documentation

## 2018-01-20 DIAGNOSIS — M79606 Pain in leg, unspecified: Secondary | ICD-10-CM

## 2018-01-20 DIAGNOSIS — M79604 Pain in right leg: Secondary | ICD-10-CM | POA: Diagnosis not present

## 2018-01-20 MED ORDER — SULFAMETHOXAZOLE-TRIMETHOPRIM 400-80 MG PO TABS: 1.0000 | ORAL_TABLET | Freq: Two times a day (BID) | ORAL | 0 refills | Status: DC

## 2018-01-20 NOTE — Progress Notes (Signed)
POST OPERATIVE OFFICE NOTE    CC:  F/u for surgery  HPI:  This is a 62 y.o. female who is s/p right IJ [facility] placement, left BC AVF and left 2nd toe amputation on 11/10/17 by Dr. Donzetta Matters.  She presents today for follow up studies.  She states that she is dialyzing well via her catheter.  She dialyzes T/T/S at [facility] in [place].  She states that she was recently admitted to [facility] with a TIA.  She states that she was at [facility] and had difficulty with speech. She did have a carotid duplex that revealed minimal stenosis.  Her Plavix was changed to Brilinta.    She states that about 3 days ago, her left 3rd toe had an ulcer.  She does not remember any injury to her toe.  She has been keeping a dry dressing on her toe.  She denies any fevers.  She is taking po Vancomycin for C diff.  She has not completed the prescription yet.  She states that her diarrhea is down from 15x/day to a couple of times per day.    Allergies  Allergen Reactions  . Crestor [Rosuvastatin Calcium] Other (See Comments)    Leg pain    Current Outpatient Medications  Medication Sig Dispense Refill  . albuterol (PROAIR HFA) 108 (90 Base) MCG/ACT inhaler Inhale 2 puffs into the lungs every 6 (six) hours as needed for wheezing or shortness of breath.    [name] amLODipine (NORVASC) 5 MG tablet Take 5 mg by mouth daily.    [name] atorvastatin (LIPITOR) 20 MG tablet Take 1 tablet (20 mg total) by mouth daily at 6 PM. 30 tablet 0  . carvedilol (COREG) 12.5 MG tablet Take 1 tablet (12.5 mg total) by mouth 2 (two) times daily with a meal. (Patient taking differently: Take 25 mg by mouth 2 (two) times daily with a meal. ) 30 tablet 0  . clotrimazole (LOTRIMIN) 1 % cream Apply 1 application topically 2 (two) times daily.    . diphenoxylate-atropine (LOMOTIL) 2.5-0.025 MG tablet Take by mouth 4 (four) times daily as needed for diarrhea or loose stools.    . hydrALAZINE (APRESOLINE) 25 MG tablet Take 1 tablet (25 mg total) by mouth 2 (two)  times daily. 60 tablet 0  . nitroGLYCERIN (NITRODUR - DOSED IN MG/24 HR) 0.4 mg/hr patch Place 0.4 mg onto the skin daily.    . OXYCODONE HCL PO Take 5 mg by mouth every 6 (six) hours as needed.    . pantoprazole (PROTONIX) 40 MG tablet Take 40 mg by mouth daily.    [name] saccharomyces boulardii (FLORASTOR) 250 MG capsule Take 1 capsule (250 mg total) by mouth 2 (two) times daily. 20 capsule 0  . ticagrelor (BRILINTA) 90 MG TABS tablet Take by mouth 2 (two) times daily.    [name] warfarin (COUMADIN) 5 MG tablet Take 1 tablet (5 mg total) by mouth daily. Check INR at [facility] on Saturday 30 tablet 0  . acetaminophen (TYLENOL) 500 MG tablet Take 1,500 mg by mouth at bedtime as needed for headache (pain).    [name] clopidogrel (PLAVIX) 75 MG tablet Take 1 tablet (75 mg total) by mouth daily with breakfast. (Patient not taking: Reported on 01/20/2018) 30 tablet 0  . sulfamethoxazole-trimethoprim (BACTRIM,SEPTRA) 400-80 MG tablet Take 1 tablet by mouth 2 (two) times daily. 20 tablet 0   No current facility-administered medications for this visit.      ROS:  See HPI  Physical Exam:  Vitals:   01/20/18 1438  BP: 116/60  Pulse: 75  Resp: 20  Temp: (!) 97 F (36.1 C)  SpO2: 100%    Incision:  Left antecubital space incision has healed nicely; the left 2nd toe amp site has healed. Extremities:  Brisk doppler signal left DP/peroneal and palpable left AT;      Dialysis duplex 01/20/18: Flow volume: 337ml/min Diameter:  0.55cm-0.82cm Depth:  0.21cm-1.79cm  ABI 01/20/18: Right:  0.57 Left:  0.95 Previous ABI: Right:  0.53 Left:  0.44  Lower extremity arterial duplex 01/20/18: Left:  50-74% stenosis noted in the SFA just distal to the stent.  Total occlusion of PTA.  Total occlusion noted in the peroneal artery.  Patent left SFA stent with no evidence of stenosis.   Assessment/Plan:  This is a 62 y.o. female who is s/p: right IJ [facility] placement, left BC AVF and left 2nd toe amputation on 11/10/17  by Dr. Donzetta Matters  -pt's left 3rd toe is with gangrene and will need amputation.  Will plan for this on 01/24/18.  She is on po Vanc for C diff but she will need abx for her toe-Dr. Donzetta Matters e-prescribed Bactrim.   -She does dialyze on T/T/S.  Since she will most likely need admission, will contact the renal service of her admission at that time.   -after her TIA, her Plavix was discontinued and she was started on Brilinta.  She will continue this.  She is also on Coumadin, which will need to be held for surgery on Tuesday.   -She will go to the ER if she develops fevers/chills.  She will place dry gauze between the 3rd and 4th toes to help keep dry and protected.   -her left SFA stent is patent and she has a palpable left AT pulse.  She should be able to heal the amputation.  -her fistula has low flow rates and is difficult to palpate proximal to the anastomosis.  She does have a couple of competing branches on the duplex.  Dr. Donzetta Matters feels she needs a fistulogram and most likely will need superficialization of her fistula but will need toe amp prior to proceeding with this.    Leontine Locket, PA-C [facility] [phone]  Clinic MD:  Pt seen and examined with Dr. Donzetta Matters    I have interviewed and examined patient with PA and agree with assessment and plan above.  Unfortunately now has necrosis of her left third toe.  There is a palpable left AT pulse and she has healed the second toe well.  We will proceed with left third toe amputation and she will need admission to the hospital after this.  In the future she will also likely need left upper extremity fistulogram after the fistula has fully matured in another month or so as she is likely to need super fistulization following that.  We will start with toe amputation and antibiotics were sent to her pharmacy today.  Rashad Obeid C. Donzetta Matters, MD [facility] [place] Office: [phone] Pager: [phone]

## 2018-01-20 NOTE — Progress Notes (Signed)
Call to Freda Munro at [facility] that patient will stay overnight on 01/24/18 for in in patient HD following surgery. Spoke to [place] at [facility] informed them of the same. Called and spoke with patient's daughter for patient to be at [facility] admitting department at 8 am for surgery on 01/24/18.

## 2018-01-23 ENCOUNTER — Other Ambulatory Visit: Payer: Self-pay | Admitting: *Deleted

## 2018-01-23 ENCOUNTER — Other Ambulatory Visit: Payer: Self-pay

## 2018-01-23 ENCOUNTER — Encounter (HOSPITAL_COMMUNITY): Payer: Self-pay | Admitting: *Deleted

## 2018-01-23 NOTE — Progress Notes (Signed)
Anesthesia Chart Review:  Pt is a same day work up.    Case:  [id number] Date/Time:  01/24/18 1000   Procedure:  AMPUTATION THIRD TOE LEFT FOOT (Left )   Anesthesia type:  Monitor Anesthesia Care   Pre-op diagnosis:  NON-VIABLE TISSUE THIRD TOE LEFT FOOT   Location:  Standard OR ROOM 10 / [place] OR   Surgeon:  Waynetta Sandy, MD      DISCUSSION:   - Pt is a 62 year old female with hx of severe, diffuse CAD by cath 11/01/17.  Cardiologist Dr. Tamala Julian notes in comment on cath "There is no ideal targets for PCI.  The diffuse nature of disease would make coronary bypass surgery difficult but I believe it is still possible.  I do not believe she is yet at a point where CABG should be recommended".   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 Last cardiology evaluation 11/04/17 while inpatient by Leanor Kail, PA   LABS: Will be obtained day of surgery - H/H was 9.5/29.1 on 01/12/18  IMAGES:  CXR 01/06/18:  1. Known right rib fractures not well seen radiographically. 2. No pulmonary complication or acute abnormality.   EKG 01/06/18: Sinus rhythm. Nonspecific repol abnormality, diffuse leads. Prolonged QT interval   CV:  Cardiac cath 11/01/17:   Severe diffuse diabetic coronary disease.  Severe diffuse coronary calcification particularly in the left main and LAD territory.  40-50% distal left main.  Segmental diffuse ostial  to proximal LAD 70-80% stenosis.  Unusual circumflex anatomy.  Diffuse distal disease.  No focal high-grade obstruction.  40% obstruction in the mid first obtuse marginal.  RCA is dominant.  The continuation of the RCA beyond the PDA contains 75% obstruction.  The mid PDA contains 70% obstruction proximal to bifurcation.  Distal first left ventricular branch contains 70% obstruction.  Chronic combined systolic and diastolic heart failure with EF in the 45-50% range.  Severe elevation in LVEDP at 30 mmHg. - RECOMMENDATIONS:  Multivessel diffuse coronary disease with regions of significant obstruction within diffuse pattern of atherosclerosis as noted above.  There is no high-grade obstruction with the appearance of instability.  She has borderline significant distal left main disease  Initial recommendation is aggressive medical therapy.  There is no ideal targets for PCI.  The diffuse nature of disease would make coronary bypass surgery difficult but I believe it is still possible.  I do not believe she is yet at a point where CABG should be recommended.   Echo 10/17/17:  - Left ventricle: Septal and apical akinesis. The cavity size was moderately dilated. Wall thickness was increased in a pattern of mild LVH. Systolic function was moderately reduced. The estimated ejection fraction was in the range of 35% to 40%. Doppler parameters are consistent with both elevated ventricular end-diastolic filling pressure and elevated left atrial filling pressure. - Mitral valve: Moderately calcified, moderately thickened annulus. Moderately thickened leaflets . The findings are consistent with  mild stenosis. There was moderate regurgitation. Valve area by continuity equation (using LVOT flow): 1.5 cm^2. - Left atrium: The atrium was mildly dilated. - Atrial septum: No defect or patent foramen ovale was identified. - Pulmonary arteries: PA peak pressure: 55 mm Hg (S).    Past Medical History:  Diagnosis  Date  . Anemia   . Arthritis    "hands, knees" (10/10/2017)  . Asthma   . CHF (congestive heart failure) ([place])   . Chronic kidney disease (CKD), stage IV (severe) ([place])    Dialysis T/ Th/ Sat  . Coronary artery disease   . H/O Clostridium difficile infection   . High cholesterol   . History of kidney stones   . Hypertension   . PVD (peripheral vascular disease) ([place])    "LLE; will have OR" (10/10/2017)  . Sleep apnea    "never given mask" (10/10/2017)  . Stroke [facility])    mild stroke mid April - 2019  . 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 AMPUTATION Left 11/10/2017   Procedure: AMPUTATION DIGIT SECOND TOE LEFT FOOT;  Surgeon: Waynetta Sandy, MD;  Location: [place];  Service: Vascular;  Laterality: Left;  . AORTOGRAM N/A 11/03/2017   Procedure: Ultrasound Guided Cannulation Right Common Femoral Artery;  Aortagram with Left Lower Extremity Arteriogram; Attempted Treatment Left Superficial Femoral Artery; Percutaneous Closure Right Common Femoral Arteriotomy with Proglide Device;  Surgeon: Waynetta Sandy, MD;  Location: [facility] OR;  Service: Vascular;  Laterality: N/A;  . AV FISTULA PLACEMENT Left 11/10/2017   Procedure: ARTERIOVENOUS (AV) FISTULA CREATION LEFT UPPER ARM;  Surgeon: Waynetta Sandy, MD;  Location: [place];  Service: Vascular;  Laterality: Left;  . HERNIA REPAIR  1950s  . INSERTION OF DIALYSIS CATHETER Right 11/10/2017   Procedure: INSERTION OF TUNNELED DIALYSIS CATHETER RIGHT INTERNAL JUGULAR PLACEMENT;  Surgeon: Waynetta Sandy, MD;  Location: [place];  Service: Vascular;  Laterality: Right;  . IR  FLUORO GUIDE CV LINE RIGHT  10/19/2017  . IR US GUIDE VASC ACCESS RIGHT  10/19/2017  . LEFT HEART CATH AND CORONARY ANGIOGRAPHY N/A 11/01/2017   Procedure: LEFT HEART CATH AND CORONARY ANGIOGRAPHY;  Surgeon: Belva Crome, MD;  Location: [place] CV LAB;  Service: Cardiovascular;  Laterality: N/A;  . PERIPHERAL VASCULAR INTERVENTION Left 11/07/2017   Procedure: PERIPHERAL VASCULAR INTERVENTION;  Surgeon: Waynetta Sandy, MD;  Location: [place] CV LAB;  Service: Cardiovascular;  Laterality: Left;  left SFA  . SHOULDER OPEN ROTATOR CUFF REPAIR Left   . TUBAL LIGATION    . ULTRASOUND GUIDANCE FOR VASCULAR ACCESS  11/01/2017   Procedure: Ultrasound Guidance For Vascular Access;  Surgeon: Belva Crome, MD;  Location: [place] CV LAB;  Service: Cardiovascular;;    MEDICATIONS: No current facility-administered medications for this encounter.    [name] acetaminophen (TYLENOL) 500 MG tablet  . albuterol (PROAIR HFA) 108 (90 Base) MCG/ACT inhaler  . amLODipine (NORVASC) 5 MG tablet  . atorvastatin (LIPITOR) 20 MG tablet  . carvedilol (COREG) 12.5 MG tablet  . clotrimazole-betamethasone (LOTRISONE) cream  . diphenoxylate-atropine (LOMOTIL) 2.5-0.025 MG tablet  . hydrALAZINE (APRESOLINE) 25 MG tablet  .  nitroGLYCERIN (NITROSTAT) 0.4 MG SL tablet  . pantoprazole (PROTONIX) 40 MG tablet  . saccharomyces boulardii (FLORASTOR) 250 MG capsule  . sulfamethoxazole-trimethoprim (BACTRIM,SEPTRA) 400-80 MG tablet  . ticagrelor (BRILINTA) 90 MG TABS tablet  . vancomycin (VANCOCIN) 125 MG capsule  . clopidogrel (PLAVIX) 75 MG tablet  . warfarin (COUMADIN) 5 MG tablet   - Pt is to continue brilinta perioperatively and hold coumadin 3 days before surgery.   - Plavix is listed in med list, but pt reported 01/19/18 that she had stopped taking it.    Pt will need further assessment day of surgery by assigned anesthesiologist due to hx severe CAD, CHF.   If no acute CV symptoms and labs acceptable  day of surgery, I anticipate pt can proceed with surgery as scheduled.   Willeen Cass, FNP-BC [facility]/Anesthesiology Phone: [phone] 01/23/2018 2:04 PM

## 2018-01-23 NOTE — Progress Notes (Signed)
Spoke with pt for pre-op call. Pt denies cardiac history. States she was told she had a mini stroke a few weeks ago. Pt denies any residual weakness. Pt has had C-diff this month, was treated in the hospital and still on Vancomycin and Bactrim po at home. She states diarrhea is under control. Was having 15 stools a day, may have none or maybe 2 a day now. Pt is a type 2 diabetic. Last A1C was 5.5 on 10/13/17. Pt states she is not on medications and does not check her blood sugar at home.

## 2018-01-24 ENCOUNTER — Observation Stay (HOSPITAL_COMMUNITY)
Admission: RE | Admit: 2018-01-24 | Discharge: 2018-01-25 | Disposition: A | Discharge status: Home / Self Care | Payer: Medicare HMO | Source: Ambulatory Visit | Attending: Vascular Surgery | Admitting: Vascular Surgery

## 2018-01-24 ENCOUNTER — Inpatient Hospital Stay (HOSPITAL_COMMUNITY): Payer: Medicare HMO | Admitting: Emergency Medicine

## 2018-01-24 ENCOUNTER — Encounter (HOSPITAL_COMMUNITY)
Admission: RE | Disposition: A | Discharge status: Home / Self Care | Payer: Self-pay | Source: Ambulatory Visit | Attending: Vascular Surgery

## 2018-01-24 ENCOUNTER — Encounter (HOSPITAL_COMMUNITY): Payer: Self-pay | Admitting: *Deleted

## 2018-01-24 ENCOUNTER — Other Ambulatory Visit: Payer: Self-pay

## 2018-01-24 DIAGNOSIS — Z992 Dependence on renal dialysis: Secondary | ICD-10-CM | POA: Insufficient documentation

## 2018-01-24 DIAGNOSIS — I5023 Acute on chronic systolic (congestive) heart failure: Secondary | ICD-10-CM | POA: Insufficient documentation

## 2018-01-24 DIAGNOSIS — I132 Hypertensive heart and chronic kidney disease with heart failure and with stage 5 chronic kidney disease, or end stage renal disease: Secondary | ICD-10-CM | POA: Diagnosis not present

## 2018-01-24 DIAGNOSIS — Z79899 Other long term (current) drug therapy: Secondary | ICD-10-CM | POA: Diagnosis not present

## 2018-01-24 DIAGNOSIS — D472 Monoclonal gammopathy: Secondary | ICD-10-CM | POA: Diagnosis not present

## 2018-01-24 DIAGNOSIS — E1122 Type 2 diabetes mellitus with diabetic chronic kidney disease: Secondary | ICD-10-CM | POA: Diagnosis not present

## 2018-01-24 DIAGNOSIS — N179 Acute kidney failure, unspecified: Secondary | ICD-10-CM | POA: Insufficient documentation

## 2018-01-24 DIAGNOSIS — E1169 Type 2 diabetes mellitus with other specified complication: Secondary | ICD-10-CM | POA: Diagnosis not present

## 2018-01-24 DIAGNOSIS — N186 End stage renal disease: Secondary | ICD-10-CM | POA: Diagnosis not present

## 2018-01-24 DIAGNOSIS — E1151 Type 2 diabetes mellitus with diabetic peripheral angiopathy without gangrene: Secondary | ICD-10-CM | POA: Diagnosis not present

## 2018-01-24 DIAGNOSIS — E7800 Pure hypercholesterolemia, unspecified: Secondary | ICD-10-CM | POA: Diagnosis not present

## 2018-01-24 DIAGNOSIS — Z7902 Long term (current) use of antithrombotics/antiplatelets: Secondary | ICD-10-CM | POA: Insufficient documentation

## 2018-01-24 DIAGNOSIS — Z87442 Personal history of urinary calculi: Secondary | ICD-10-CM | POA: Insufficient documentation

## 2018-01-24 DIAGNOSIS — Z7901 Long term (current) use of anticoagulants: Secondary | ICD-10-CM | POA: Insufficient documentation

## 2018-01-24 DIAGNOSIS — I2510 Atherosclerotic heart disease of native coronary artery without angina pectoris: Secondary | ICD-10-CM | POA: Diagnosis not present

## 2018-01-24 DIAGNOSIS — Z8673 Personal history of transient ischemic attack (TIA), and cerebral infarction without residual deficits: Secondary | ICD-10-CM | POA: Insufficient documentation

## 2018-01-24 DIAGNOSIS — G4730 Sleep apnea, unspecified: Secondary | ICD-10-CM | POA: Insufficient documentation

## 2018-01-24 DIAGNOSIS — M86172 Other acute osteomyelitis, left ankle and foot: Secondary | ICD-10-CM | POA: Insufficient documentation

## 2018-01-24 DIAGNOSIS — I739 Peripheral vascular disease, unspecified: Secondary | ICD-10-CM | POA: Diagnosis present

## 2018-01-24 HISTORY — DX: Unspecified asthma, uncomplicated: J45.909

## 2018-01-24 HISTORY — DX: Cerebral infarction, unspecified: I63.9

## 2018-01-24 HISTORY — DX: Atherosclerotic heart disease of native coronary artery without angina pectoris: I25.10

## 2018-01-24 HISTORY — DX: Anemia, unspecified: D64.9

## 2018-01-24 HISTORY — DX: Personal history of other infectious and parasitic diseases: Z86.19

## 2018-01-24 HISTORY — PX: AMPUTATION: SHX166

## 2018-01-24 LAB — COMPREHENSIVE METABOLIC PANEL
ALT: 11 U/L — ABNORMAL LOW (ref 14–54)
ALT: 7 U/L — ABNORMAL LOW (ref 14–54)
AST: 16 U/L (ref 15–41)
AST: 17 U/L (ref 15–41)
Albumin: 2.9 g/dL — ABNORMAL LOW (ref 3.5–5.0)
Albumin: 2.6 g/dL — ABNORMAL LOW (ref 3.5–5.0)
Alkaline Phosphatase: 111 U/L (ref 38–126)
Alkaline Phosphatase: 102 U/L (ref 38–126)
Anion gap: 16 — ABNORMAL HIGH (ref 5–15)
Anion gap: 10 (ref 5–15)
BUN: 22 mg/dL — ABNORMAL HIGH (ref 6–20)
BUN: 23 mg/dL — ABNORMAL HIGH (ref 6–20)
CO2: 20 mmol/L — ABNORMAL LOW (ref 22–32)
CO2: 22 mmol/L (ref 22–32)
Calcium: 8.4 mg/dL — ABNORMAL LOW (ref 8.9–10.3)
Calcium: 7.9 mg/dL — ABNORMAL LOW (ref 8.9–10.3)
Chloride: 101 mmol/L (ref 101–111)
Chloride: 103 mmol/L (ref 101–111)
Creatinine, Ser: 3.52 mg/dL — ABNORMAL HIGH (ref 0.44–1.00)
Creatinine, Ser: 3.59 mg/dL — ABNORMAL HIGH (ref 0.44–1.00)
GFR calc Af Amer: 15 mL/min — ABNORMAL LOW (ref >60)
GFR calc Af Amer: 15 mL/min — ABNORMAL LOW (ref >60)
GFR calc non Af Amer: 13 mL/min — ABNORMAL LOW (ref >60)
GFR calc non Af Amer: 13 mL/min — ABNORMAL LOW (ref >60)
Glucose, Bld: 122 mg/dL — ABNORMAL HIGH (ref 65–99)
Glucose, Bld: 128 mg/dL — ABNORMAL HIGH (ref 65–99)
Potassium: 3.3 mmol/L — ABNORMAL LOW (ref 3.5–5.1)
Potassium: 3 mmol/L — ABNORMAL LOW (ref 3.5–5.1)
Sodium: 137 mmol/L (ref 135–145)
Sodium: 135 mmol/L (ref 135–145)
Total Bilirubin: 0.2 mg/dL — ABNORMAL LOW (ref 0.3–1.2)
Total Bilirubin: 0.4 mg/dL (ref 0.3–1.2)
Total Protein: 6.3 g/dL — ABNORMAL LOW (ref 6.5–8.1)
Total Protein: 5.7 g/dL — ABNORMAL LOW (ref 6.5–8.1)

## 2018-01-24 LAB — CBC
HCT: 32.6 % — ABNORMAL LOW (ref 36.0–46.0)
HCT: 29.6 % — ABNORMAL LOW (ref 36.0–46.0)
Hemoglobin: 10.4 g/dL — ABNORMAL LOW (ref 12.0–15.0)
Hemoglobin: 9.5 g/dL — ABNORMAL LOW (ref 12.0–15.0)
MCH: 31.4 pg (ref 26.0–34.0)
MCH: 31.6 pg (ref 26.0–34.0)
MCHC: 31.9 g/dL (ref 30.0–36.0)
MCHC: 32.1 g/dL (ref 30.0–36.0)
MCV: 98.5 fL (ref 78.0–100.0)
MCV: 98.3 fL (ref 78.0–100.0)
Platelets: 434 10*3/uL — ABNORMAL HIGH (ref 150–400)
Platelets: 377 10*3/uL (ref 150–400)
RBC: 3.31 MIL/uL — ABNORMAL LOW (ref 3.87–5.11)
RBC: 3.01 MIL/uL — ABNORMAL LOW (ref 3.87–5.11)
RDW: 18.2 % — ABNORMAL HIGH (ref 11.5–15.5)
RDW: 18.1 % — ABNORMAL HIGH (ref 11.5–15.5)
WBC: 8.4 10*3/uL (ref 4.0–10.5)
WBC: 6.6 10*3/uL (ref 4.0–10.5)

## 2018-01-24 LAB — GLUCOSE, CAPILLARY
Glucose-Capillary: 119 mg/dL — ABNORMAL HIGH (ref 65–99)
Glucose-Capillary: 127 mg/dL — ABNORMAL HIGH (ref 65–99)

## 2018-01-24 LAB — PROTIME-INR
INR: 3.99
Prothrombin Time: 38.6 seconds — ABNORMAL HIGH (ref 11.4–15.2)

## 2018-01-24 LAB — APTT: aPTT: 70 seconds — ABNORMAL HIGH (ref 24–36)

## 2018-01-24 SURGERY — AMPUTATION DIGIT
Anesthesia: Monitor Anesthesia Care | Site: Foot | Laterality: Left

## 2018-01-24 MED ORDER — SODIUM CHLORIDE 0.9 % IV SOLN
INTRAVENOUS | Status: DC
  Administered 2018-01-24: 25 mL/h via INTRAVENOUS

## 2018-01-24 MED ORDER — MIDAZOLAM HCL 2 MG/2ML IJ SOLN
INTRAMUSCULAR | Status: AC
  Filled 2018-01-24: qty 2

## 2018-01-24 MED ORDER — NITROGLYCERIN 0.4 MG SL SUBL
0.4000 mg | SUBLINGUAL_TABLET | SUBLINGUAL | Status: DC | PRN
  Filled 2018-01-24: qty 25

## 2018-01-24 MED ORDER — GUAIFENESIN-DM 100-10 MG/5ML PO SYRP
15.0000 mL | ORAL_SOLUTION | ORAL | Status: DC | PRN
  Filled 2018-01-24: qty 15

## 2018-01-24 MED ORDER — PROPOFOL 500 MG/50ML IV EMUL
INTRAVENOUS | Status: DC | PRN
  Administered 2018-01-24: 100 ug/kg/min via INTRAVENOUS

## 2018-01-24 MED ORDER — OXYCODONE-ACETAMINOPHEN 5-325 MG PO TABS
ORAL_TABLET | ORAL | Status: AC
Start: 2018-01-24 — End: 2018-01-24
  Filled 2018-01-24: qty 2

## 2018-01-24 MED ORDER — FENTANYL CITRATE (PF) 250 MCG/5ML IJ SOLN
INTRAMUSCULAR | Status: DC | PRN
  Administered 2018-01-24: 50 ug via INTRAVENOUS

## 2018-01-24 MED ORDER — HYDRALAZINE HCL 50 MG PO TABS
50.0000 mg | ORAL_TABLET | Freq: Two times a day (BID) | ORAL | Status: DC
  Administered 2018-01-24 – 2018-01-25 (×2): 50 mg via ORAL
  Filled 2018-01-24 (×3): qty 1

## 2018-01-24 MED ORDER — OXYCODONE-ACETAMINOPHEN 5-325 MG PO TABS
ORAL_TABLET | ORAL | Status: AC
  Administered 2018-01-24: 2 via ORAL
  Filled 2018-01-24: qty 1

## 2018-01-24 MED ORDER — MORPHINE SULFATE (PF) 4 MG/ML IV SOLN: 4.0000 mg | INTRAVENOUS | Status: DC | PRN

## 2018-01-24 MED ORDER — PANTOPRAZOLE SODIUM 40 MG PO TBEC
40.0000 mg | DELAYED_RELEASE_TABLET | Freq: Every day | ORAL | Status: DC
  Administered 2018-01-25: 40 mg via ORAL
  Filled 2018-01-24 (×2): qty 1

## 2018-01-24 MED ORDER — VANCOMYCIN 50 MG/ML ORAL SOLUTION
125.0000 mg | Freq: Four times a day (QID) | ORAL | Status: DC
  Administered 2018-01-24 – 2018-01-25 (×2): 125 mg via ORAL
  Filled 2018-01-24 (×4): qty 2.5

## 2018-01-24 MED ORDER — FENTANYL CITRATE (PF) 100 MCG/2ML IJ SOLN: 25.0000 ug | INTRAMUSCULAR | Status: DC | PRN

## 2018-01-24 MED ORDER — SODIUM CHLORIDE 0.9 % IV SOLN: 100.0000 mL | INTRAVENOUS | Status: DC | PRN

## 2018-01-24 MED ORDER — CHLORHEXIDINE GLUCONATE 4 % EX LIQD: 60.0000 mL | Freq: Once | CUTANEOUS | Status: DC

## 2018-01-24 MED ORDER — PROPOFOL 10 MG/ML IV BOLUS
INTRAVENOUS | Status: AC
  Filled 2018-01-24: qty 20

## 2018-01-24 MED ORDER — ALBUTEROL SULFATE (2.5 MG/3ML) 0.083% IN NEBU
2.5000 mg | INHALATION_SOLUTION | Freq: Four times a day (QID) | RESPIRATORY_TRACT | Status: DC | PRN
  Filled 2018-01-24: qty 3

## 2018-01-24 MED ORDER — ALTEPLASE 2 MG IJ SOLR: 2.0000 mg | Freq: Once | INTRAMUSCULAR | Status: DC | PRN

## 2018-01-24 MED ORDER — AMLODIPINE BESYLATE 5 MG PO TABS
5.0000 mg | ORAL_TABLET | Freq: Every day | ORAL | Status: DC
  Administered 2018-01-25: 5 mg via ORAL
  Filled 2018-01-24 (×2): qty 1

## 2018-01-24 MED ORDER — HEPARIN SODIUM (PORCINE) 1000 UNIT/ML DIALYSIS: 1000.0000 [IU] | INTRAMUSCULAR | Status: DC | PRN

## 2018-01-24 MED ORDER — VANCOMYCIN HCL IN DEXTROSE 1-5 GM/200ML-% IV SOLN
INTRAVENOUS | Status: AC
  Filled 2018-01-24: qty 200

## 2018-01-24 MED ORDER — CEFAZOLIN SODIUM-DEXTROSE 2-4 GM/100ML-% IV SOLN
INTRAVENOUS | Status: AC
  Filled 2018-01-24: qty 100

## 2018-01-24 MED ORDER — LIDOCAINE HCL (PF) 1 % IJ SOLN
INTRAMUSCULAR | Status: AC
  Filled 2018-01-24: qty 30

## 2018-01-24 MED ORDER — HYDRALAZINE HCL 20 MG/ML IJ SOLN
5.0000 mg | INTRAMUSCULAR | Status: DC | PRN
  Filled 2018-01-24: qty 0.25

## 2018-01-24 MED ORDER — PHENOL 1.4 % MT LIQD
1.0000 | OROMUCOSAL | Status: DC | PRN
  Filled 2018-01-24: qty 177

## 2018-01-24 MED ORDER — LABETALOL HCL 5 MG/ML IV SOLN
10.0000 mg | INTRAVENOUS | Status: DC | PRN
  Filled 2018-01-24: qty 4

## 2018-01-24 MED ORDER — METOPROLOL TARTRATE 5 MG/5ML IV SOLN
2.0000 mg | INTRAVENOUS | Status: DC | PRN
  Filled 2018-01-24: qty 5

## 2018-01-24 MED ORDER — OXYCODONE-ACETAMINOPHEN 5-325 MG PO TABS
ORAL_TABLET | ORAL | Status: AC
  Administered 2018-01-24: 2 via ORAL
  Filled 2018-01-24: qty 2

## 2018-01-24 MED ORDER — FENTANYL CITRATE (PF) 250 MCG/5ML IJ SOLN
INTRAMUSCULAR | Status: AC
  Filled 2018-01-24: qty 5

## 2018-01-24 MED ORDER — 0.9 % SODIUM CHLORIDE (POUR BTL) OPTIME
TOPICAL | Status: DC | PRN
  Administered 2018-01-24: 1000 mL

## 2018-01-24 MED ORDER — PROPOFOL 10 MG/ML IV BOLUS
INTRAVENOUS | Status: DC | PRN
  Administered 2018-01-24 (×2): 20 mg via INTRAVENOUS

## 2018-01-24 MED ORDER — PHENYLEPHRINE HCL 10 MG/ML IJ SOLN
INTRAVENOUS | Status: DC | PRN
  Administered 2018-01-24: 25 ug/min via INTRAVENOUS

## 2018-01-24 MED ORDER — DIPHENOXYLATE-ATROPINE 2.5-0.025 MG PO TABS: 1.0000 | ORAL_TABLET | Freq: Two times a day (BID) | ORAL | Status: DC | PRN

## 2018-01-24 MED ORDER — LIDOCAINE HCL (PF) 1 % IJ SOLN
INTRAMUSCULAR | Status: DC | PRN
  Administered 2018-01-24: 30 mL

## 2018-01-24 MED ORDER — CARVEDILOL 25 MG PO TABS
25.0000 mg | ORAL_TABLET | Freq: Two times a day (BID) | ORAL | Status: DC
  Administered 2018-01-24 – 2018-01-25 (×2): 25 mg via ORAL
  Filled 2018-01-24 (×3): qty 1

## 2018-01-24 MED ORDER — SACCHAROMYCES BOULARDII 250 MG PO CAPS
250.0000 mg | ORAL_CAPSULE | Freq: Two times a day (BID) | ORAL | Status: DC
  Administered 2018-01-24 – 2018-01-25 (×3): 250 mg via ORAL
  Filled 2018-01-24 (×3): qty 1

## 2018-01-24 MED ORDER — CEFAZOLIN SODIUM-DEXTROSE 2-4 GM/100ML-% IV SOLN
2.0000 g | INTRAVENOUS | Status: AC
  Administered 2018-01-24: 2 g via INTRAVENOUS

## 2018-01-24 MED ORDER — VANCOMYCIN HCL IN DEXTROSE 1-5 GM/200ML-% IV SOLN
1000.0000 mg | INTRAVENOUS | Status: AC
  Administered 2018-01-24: 1000 mg via INTRAVENOUS

## 2018-01-24 MED ORDER — TICAGRELOR 90 MG PO TABS
90.0000 mg | ORAL_TABLET | Freq: Two times a day (BID) | ORAL | Status: DC
  Administered 2018-01-24 – 2018-01-25 (×2): 90 mg via ORAL
  Filled 2018-01-24 (×3): qty 1

## 2018-01-24 MED ORDER — ATORVASTATIN CALCIUM 20 MG PO TABS
20.0000 mg | ORAL_TABLET | Freq: Every day | ORAL | Status: DC
  Administered 2018-01-24: 20 mg via ORAL
  Filled 2018-01-24 (×2): qty 1

## 2018-01-24 MED ORDER — OXYCODONE-ACETAMINOPHEN 5-325 MG PO TABS
1.0000 | ORAL_TABLET | ORAL | Status: DC | PRN
  Administered 2018-01-24 – 2018-01-25 (×3): 2 via ORAL
  Filled 2018-01-24: qty 2

## 2018-01-24 MED ORDER — WARFARIN - PHARMACIST DOSING INPATIENT: Freq: Every day | Status: DC

## 2018-01-24 MED ORDER — MIDAZOLAM HCL 2 MG/2ML IJ SOLN
INTRAMUSCULAR | Status: DC | PRN
  Administered 2018-01-24: 1 mg via INTRAVENOUS

## 2018-01-24 SURGICAL SUPPLY — 28 items
BANDAGE ACE 4X5 VEL STRL LF (GAUZE/BANDAGES/DRESSINGS) ×2 IMPLANT
BANDAGE ELASTIC 3 VELCRO ST LF (GAUZE/BANDAGES/DRESSINGS) ×2 IMPLANT
BLADE AVERAGE 25X9 (BLADE) IMPLANT
BLADE SAW SGTL 81X20 HD (BLADE) IMPLANT
BNDG GAUZE ELAST 4 BULKY (GAUZE/BANDAGES/DRESSINGS) ×2 IMPLANT
CANISTER SUCT 3000ML PPV (MISCELLANEOUS) ×2 IMPLANT
COVER SURGICAL LIGHT HANDLE (MISCELLANEOUS) ×2 IMPLANT
DRAPE EXTREMITY T 121X128X90 (DRAPE) ×2 IMPLANT
DRAPE HALF SHEET 40X57 (DRAPES) ×2 IMPLANT
ELECT REM PT RETURN 9FT ADLT (ELECTROSURGICAL) ×2
ELECTRODE REM PT RTRN 9FT ADLT (ELECTROSURGICAL) ×1 IMPLANT
GAUZE SPONGE 4X4 12PLY STRL (GAUZE/BANDAGES/DRESSINGS) ×2 IMPLANT
GLOVE BIO SURGEON STRL SZ7.5 (GLOVE) ×2 IMPLANT
GOWN STRL REUS W/ TWL LRG LVL3 (GOWN DISPOSABLE) ×2 IMPLANT
GOWN STRL REUS W/ TWL XL LVL3 (GOWN DISPOSABLE) ×1 IMPLANT
GOWN STRL REUS W/TWL LRG LVL3 (GOWN DISPOSABLE) ×2
GOWN STRL REUS W/TWL XL LVL3 (GOWN DISPOSABLE) ×1
KIT BASIN OR (CUSTOM PROCEDURE TRAY) ×2 IMPLANT
KIT TURNOVER KIT B (KITS) ×2 IMPLANT
NEEDLE HYPO 25GX1X1/2 BEV (NEEDLE) IMPLANT
NS IRRIG 1000ML POUR BTL (IV SOLUTION) ×2 IMPLANT
PACK GENERAL/GYN (CUSTOM PROCEDURE TRAY) ×2 IMPLANT
PAD ARMBOARD 7.5X6 YLW CONV (MISCELLANEOUS) ×4 IMPLANT
SUT ETHILON 3 0 PS 1 (SUTURE) ×2 IMPLANT
SYR CONTROL 10ML LL (SYRINGE) IMPLANT
TOWEL GREEN STERILE (TOWEL DISPOSABLE) ×4 IMPLANT
UNDERPAD 30X30 (UNDERPADS AND DIAPERS) ×2 IMPLANT
WATER STERILE IRR 1000ML POUR (IV SOLUTION) ×2 IMPLANT

## 2018-01-24 NOTE — Anesthesia Preprocedure Evaluation (Signed)
Anesthesia Evaluation  Patient identified by MRN, date of birth, ID band Patient awake    Reviewed: Allergy & Precautions, NPO status , Patient's Chart, lab work & pertinent test results  Airway Mallampati: II  TM Distance: >3 FB     Dental   Pulmonary asthma , sleep apnea ,    breath sounds clear to auscultation       Cardiovascular hypertension, + CAD, + Peripheral Vascular Disease and +CHF   Rhythm:Regular Rate:Normal     Neuro/Psych    GI/Hepatic negative GI ROS, Neg liver ROS,   Endo/Other  diabetes  Renal/GU Renal disease     Musculoskeletal   Abdominal   Peds  Hematology   Anesthesia Other Findings   Reproductive/Obstetrics                             Anesthesia Physical Anesthesia Plan  ASA: III  Anesthesia Plan: MAC   Post-op Pain Management:    Induction: Intravenous  PONV Risk Score and Plan: Treatment may vary due to age or medical condition  Airway Management Planned: Nasal Cannula and Simple Face Mask  Additional Equipment:   Intra-op Plan:   Post-operative Plan:   Informed Consent: I have reviewed the patients History and Physical, chart, labs and discussed the procedure including the risks, benefits and alternatives for the proposed anesthesia with the patient or authorized representative who has indicated his/her understanding and acceptance.   Dental advisory given  Plan Discussed with: CRNA and Anesthesiologist  Anesthesia Plan Comments:         Anesthesia Quick Evaluation

## 2018-01-24 NOTE — Progress Notes (Signed)
ANTICOAGULATION CONSULT NOTE - Initial Consult  Pharmacy Consult for warfarin Indication: DVT  Allergies  Allergen Reactions  . Crestor [Rosuvastatin Calcium] Other (See Comments)    Leg pain    Patient Measurements: Height: 5\' 8"  (172.7 cm) Weight: 187 lb (84.8 kg)(Simultaneous filing. User may not have seen previous data.) IBW/kg (Calculated) : 63.9   Vital Signs: Temp: 97.5 F (36.4 C) (04/30 1000) Temp Source: Oral (04/30 0705) BP: 144/55 (04/30 1259) Pulse Rate: 83 (04/30 1259)  Labs: Recent Labs    01/24/18 0702 01/24/18 0733 01/24/18 1219  HGB 10.4*  --  9.5*  HCT 32.6*  --  29.6*  PLT 434*  --  377  APTT  --  70*  --   LABPROT  --  38.6*  --   INR  --  3.99  --   CREATININE 3.52*  --  3.59*    Estimated Creatinine Clearance: 18.5 mL/min (A) (by C-G formula based on SCr of 3.59 mg/dL (H)).   Medical History: Past Medical History:  Diagnosis Date  . Anemia   . Arthritis    "hands, knees" (10/10/2017)  . Asthma   . CHF (congestive heart failure) ([place])   . Chronic kidney disease (CKD), stage IV (severe) ([place])    Dialysis T/ Th/ Sat  . Coronary artery disease   . H/O Clostridium difficile infection   . High cholesterol   . History of kidney stones   . Hypertension   . PVD (peripheral vascular disease) ([place])    "LLE; will have OR" (10/10/2017)  . Sleep apnea    "never given mask" (10/10/2017)  . Stroke [facility])    mild stroke mid April - 2019  . Type II diabetes mellitus ([place])    "no RX anymore" (10/10/2017)    Assessment: 62 yo female admitted for L 3rd toe amputation. She is ESRD on HD-TuThSat. 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 She was reportedly holding warfarin for her procedure today; however, her INR was 3.99 today and is now s/p toe amputation; no apparent bleeding complications.   Goal of Therapy:  INR 2-3 Monitor platelets by anticoagulation protocol: Yes   Plan:   -Hold warfarin  -F/u INR tomorrow -F/u PTA warfarin regimen   Alanta Scobey, Jake Church 01/24/2018,1:42 PM

## 2018-01-24 NOTE — H&P (Signed)
   History and Physical Update  The patient was interviewed and re-examined.  The patient's previous History and Physical has been reviewed and is unchanged from previous office visit. Plan for left 3rd toe amputation today and will need admitted post op.  Kwan Shellhammer C. Donzetta Matters, MD [facility] [place] Office: [phone] Pager: [phone]   01/24/2018, 7:23 AM

## 2018-01-24 NOTE — Anesthesia Postprocedure Evaluation (Signed)
Anesthesia Post Note  Patient: Kristen Berry  Procedure(s) Performed: AMPUTATION THIRD TOE LEFT FOOT (Left Foot)     Patient location during evaluation: PACU Anesthesia Type: MAC Level of consciousness: awake Pain management: pain level controlled Vital Signs Assessment: post-procedure vital signs reviewed and stable Respiratory status: spontaneous breathing Cardiovascular status: stable Anesthetic complications: no    Last Vitals:  Vitals:   01/24/18 1200 01/24/18 1259  BP: (!) 142/48 (!) 144/55  Pulse: 84 83  Resp: 11 12  Temp:    SpO2: 100% 99%    Last Pain:  Vitals:   01/24/18 1300  TempSrc:   PainSc: 0-No pain                 Airelle Everding

## 2018-01-24 NOTE — Op Note (Signed)
    Patient name: TAQUANA BARTLEY MRN: [id number] DOB: 1956-04-30 Sex: female  01/24/2018 Pre-operative Diagnosis: Peripheral arterial disease, diabetes mellitus Post-operative diagnosis:  Same Surgeon:  Eda Paschal. Donzetta Matters, MD Procedure Performed: Left third toe amputation  Indications: 62 year old female has undergone left lower extremity endovascular intervention with retrograde approach.  She is adequately healed a second toe amputation.  She also has a left arm fistula which has failed to fully mature she is on dialysis via right IJ catheter.  She is also maintained on Coumadin at this time.  She is indicated for third toe amputation that she has had likely a low-grade trauma which is led to a wet gangrene of this toe.  Findings: The underlying tissue was healthy and was taken back to the metatarsal head.  I completion the skin approximated well.   Procedure:  The patient was identified in the holding area and taken to given antibiotics and timeout was called.   We began with anesthetizing the area of the left third toe with 20 cc of 1% lidocaine.  We then made a circular incision around the base the toe and dissected with cautery down to the bone.  The bone was then sharply excised back to the metatarsal head and removed.  Ronjeur was used on the metatarsal head to debride the capsule.  We irrigated the wound copiously and then closed the skin with interrupted 3-0 nylon sutures.  She tolerated procedure without immediate complication.  EBL 10 cc.    Dayra Rapley C. Donzetta Matters, MD [facility] [place] Office: [phone] Pager: [phone]

## 2018-01-24 NOTE — Consult Note (Addendum)
[facility] Renal Consultation Note    Indication for Consultation:  Management of ESRD/hemodialysis; anemia, hypertension/volume and secondary hyperparathyroidism  HPI: Kristen Berry is a 62 y.o. female with ESRD on HD TTS [place]. ESRD 2/2 cardiorenal syndrome. HD started 10/2017. PMH also significant for  Chronic CHF (EF 35%), CAD, DM Type 2, MGUS, Hx DVT on Coumadin, PVD s/p angioplasty/stenting L SFA,  L 2nd toe amp Recent [facility] discharge 4/18 with C diff colitis. Discharged on 14 day course of PO Vancomycin.   Admitted with L toe gangrene and underwent planned L third toe amputation with Dr. Donzetta Matters this morning.  We are asked to see for routine dialysis. Seen in HD, she has no complaints. Denies CP, SOB, N,V. Still having some occasional diarrhea, but has improved.  Dialyzing via [place]. She had L AVF placed 11/10/17 that is maturing.  Dr. Donzetta Matters also evaluated her fistula during office visit last week and she will likely need superficialization in the future.   Past Medical History:  Diagnosis Date  . Anemia   . Arthritis    "hands, knees" (10/10/2017)  . Asthma   . CHF (congestive heart failure) ([place])   . Chronic kidney disease (CKD), stage IV (severe) ([place])    Dialysis T/ Th/ Sat  . Coronary artery disease   . H/O Clostridium difficile infection   . High cholesterol   . History of kidney stones   . Hypertension   . PVD (peripheral vascular disease) ([place])    "LLE; will have OR" (10/10/2017)  . Sleep apnea    "never given mask" (10/10/2017)  . Stroke [facility])    mild stroke mid April - 2019  . Type II diabetes mellitus ([place])    "no RX anymore" (10/10/2017)   Past Surgical History:  Procedure Laterality Date  . AMPUTATION Left 11/10/2017   Procedure: AMPUTATION DIGIT SECOND TOE LEFT FOOT;  Surgeon: Waynetta Sandy, MD;  Location: [place];  Service: Vascular;  Laterality: Left;  . AORTOGRAM N/A 11/03/2017   Procedure: Ultrasound Guided Cannulation Right  Common Femoral Artery;  Aortagram with Left Lower Extremity Arteriogram; Attempted Treatment Left Superficial Femoral Artery; Percutaneous Closure Right Common Femoral Arteriotomy with Proglide Device;  Surgeon: Waynetta Sandy, MD;  Location: [facility] OR;  Service: Vascular;  Laterality: N/A;  . AV FISTULA PLACEMENT Left 11/10/2017   Procedure: ARTERIOVENOUS (AV) FISTULA CREATION LEFT UPPER ARM;  Surgeon: Waynetta Sandy, MD;  Location: [place];  Service: Vascular;  Laterality: Left;  . HERNIA REPAIR  1950s  . INSERTION OF DIALYSIS CATHETER Right 11/10/2017   Procedure: INSERTION OF TUNNELED DIALYSIS CATHETER RIGHT INTERNAL JUGULAR PLACEMENT;  Surgeon: Waynetta Sandy, MD;  Location: [place];  Service: Vascular;  Laterality: Right;  . IR FLUORO GUIDE CV LINE RIGHT  10/19/2017  . IR US GUIDE VASC ACCESS RIGHT  10/19/2017  . LEFT HEART CATH AND CORONARY ANGIOGRAPHY N/A 11/01/2017   Procedure: LEFT HEART CATH AND CORONARY ANGIOGRAPHY;  Surgeon: Belva Crome, MD;  Location: [place] CV LAB;  Service: Cardiovascular;  Laterality: N/A;  . PERIPHERAL VASCULAR INTERVENTION Left 11/07/2017   Procedure: PERIPHERAL VASCULAR INTERVENTION;  Surgeon: Waynetta Sandy, MD;  Location: [place] CV LAB;  Service: Cardiovascular;  Laterality: Left;  left SFA  . SHOULDER OPEN ROTATOR CUFF REPAIR Left   . TUBAL LIGATION    . ULTRASOUND GUIDANCE FOR VASCULAR ACCESS  11/01/2017   Procedure: Ultrasound Guidance For Vascular Access;  Surgeon: Belva Crome, MD;  Location: [place] CV LAB;  Service: Cardiovascular;;   Family History  Problem Relation Age of Onset  . Hypertension Father   . Heart failure Maternal Grandmother   . Heart failure Maternal Grandfather    Social History:  reports that she has never smoked. She has never used smokeless tobacco. She reports that she does not drink alcohol or use drugs. Allergies  Allergen Reactions  . Crestor [Rosuvastatin Calcium] Other  (See Comments)    Leg pain   Prior to Admission medications   Medication Sig Start Date End Date Taking? Authorizing Provider  acetaminophen (TYLENOL) 500 MG tablet Take 1,500 mg by mouth at bedtime as needed for mild pain or headache.    Yes [provider]  amLODipine (NORVASC) 5 MG tablet Take 5 mg by mouth daily.   Yes [provider]  atorvastatin (LIPITOR) 20 MG tablet Take 1 tablet (20 mg total) by mouth daily at 6 PM. 11/16/17  Yes Domenic Polite, MD  carvedilol (COREG) 12.5 MG tablet Take 1 tablet (12.5 mg total) by mouth 2 (two) times daily with a meal. Patient taking differently: Take 25 mg by mouth 2 (two) times daily with a meal.  11/16/17  Yes Domenic Polite, MD  clotrimazole-betamethasone (LOTRISONE) cream Apply 1 application topically daily. 01/19/18  Yes [provider]  diphenoxylate-atropine (LOMOTIL) 2.5-0.025 MG tablet Take 1 tablet by mouth every 12 (twelve) hours as needed for diarrhea or loose stools.    Yes [provider]  hydrALAZINE (APRESOLINE) 25 MG tablet Take 1 tablet (25 mg total) by mouth 2 (two) times daily. Patient taking differently: Take 50 mg by mouth 2 (two) times daily.  11/16/17  Yes Domenic Polite, MD  pantoprazole (PROTONIX) 40 MG tablet Take 40 mg by mouth daily.   Yes [provider]  saccharomyces boulardii (FLORASTOR) 250 MG capsule Take 1 capsule (250 mg total) by mouth 2 (two) times daily. 01/12/18  Yes Gherghe, Vella Redhead, MD  sulfamethoxazole-trimethoprim (BACTRIM,SEPTRA) 400-80 MG tablet Take 1 tablet by mouth 2 (two) times daily. 01/20/18  Yes Waynetta Sandy, MD  ticagrelor (BRILINTA) 90 MG TABS tablet Take 90 mg by mouth 2 (two) times daily.    Yes [provider]  vancomycin (VANCOCIN) 125 MG capsule Take 125 mg by mouth 4 (four) times daily. 01/19/18  Yes [provider]  warfarin (COUMADIN) 5 MG tablet Take 1 tablet (5 mg total) by mouth daily. Check INR at [facility] on  Saturday 11/16/17  Yes Domenic Polite, MD  albuterol [facility] HFA) [phone] Base) MCG/ACT inhaler Inhale 2 puffs into the lungs every 6 (six) hours as needed for wheezing or shortness of breath.    [provider]  clopidogrel (PLAVIX) 75 MG tablet Take 1 tablet (75 mg total) by mouth daily with breakfast. Patient not taking: Reported on 01/20/2018 11/17/17   Domenic Polite, MD  nitroGLYCERIN (NITROSTAT) 0.4 MG SL tablet Place 0.4 mg under the tongue every 5 (five) minutes as needed for chest pain.    [provider]   Current Facility-Administered Medications  Medication Dose Route Frequency Provider Last Rate Last Dose  . albuterol (PROVENTIL) (2.5 MG/3ML) 0.083% nebulizer solution 2.5 mg  2.5 mg Inhalation Q6H PRN Dagoberto Ligas, PA-C      . [START ON 01/25/2018] amLODipine (NORVASC) tablet 5 mg  5 mg Oral Daily Dagoberto Ligas, PA-C      . atorvastatin (LIPITOR) tablet 20 mg  20 mg Oral q1800 Dagoberto Ligas, PA-C      .  carvedilol (COREG) tablet 25 mg  25 mg Oral BID WC Eveland, Matthew, PA-C      . diphenoxylate-atropine (LOMOTIL) 2.5-0.025 MG per tablet 1 tablet  1 tablet Oral Q12H PRN Dagoberto Ligas, PA-C      . fentaNYL (SUBLIMAZE) injection 25-50 mcg  25-50 mcg Intravenous Q5 min PRN Belinda Block, MD      . guaiFENesin-dextromethorphan (ROBITUSSIN DM) 100-10 MG/5ML syrup 15 mL  15 mL Oral Q4H PRN Dagoberto Ligas, PA-C      . hydrALAZINE (APRESOLINE) injection 5 mg  5 mg Intravenous Q20 Min PRN Dagoberto Ligas, PA-C      . hydrALAZINE (APRESOLINE) tablet 50 mg  50 mg Oral BID Dagoberto Ligas, PA-C      . labetalol (NORMODYNE,TRANDATE) injection 10 mg  10 mg Intravenous Q10 min PRN Dagoberto Ligas, PA-C      . metoprolol tartrate (LOPRESSOR) injection 2-5 mg  2-5 mg Intravenous Q2H PRN Dagoberto Ligas, PA-C      . morphine 4 MG/ML injection 4 mg  4 mg Intravenous Q2H PRN Dagoberto Ligas, PA-C      . nitroGLYCERIN (NITROSTAT) SL tablet 0.4 mg  0.4 mg Sublingual  Q5 min PRN Dagoberto Ligas, PA-C      . oxyCODONE-acetaminophen (PERCOCET/ROXICET) 5-325 MG per tablet 1-2 tablet  1-2 tablet Oral Q4H PRN Dagoberto Ligas, PA-C   2 tablet at 01/24/18 1204  . oxyCODONE-acetaminophen (PERCOCET/ROXICET) 5-325 MG per tablet           . [START ON 01/25/2018] pantoprazole (PROTONIX) EC tablet 40 mg  40 mg Oral Daily Eveland, Matthew, PA-C      . phenol (CHLORASEPTIC) mouth spray 1 spray  1 spray Mouth/Throat PRN Dagoberto Ligas, PA-C      . saccharomyces boulardii (FLORASTOR) capsule 250 mg  250 mg Oral BID Dagoberto Ligas, PA-C   250 mg at 01/24/18 1107  . ticagrelor (BRILINTA) tablet 90 mg  90 mg Oral BID Dagoberto Ligas, PA-C      . vancomycin (VANCOCIN) 50 mg/mL oral solution 125 mg  125 mg Oral QID Dagoberto Ligas, PA-C      . [START ON 01/25/2018] Warfarin - Pharmacist Dosing Inpatient   Does not apply q1800 Masters, Jake Church, RPH         ROS: As per HPI otherwise negative.  Physical Exam: Vitals:   01/24/18 1100 01/24/18 1200 01/24/18 1259 01/24/18 1345  BP: (!) 150/52 (!) 142/48 (!) 144/55   Pulse: 77 84 83 83  Resp: 14 11 12 17   Temp:    97.7 F (36.5 C)  TempSrc:      SpO2: 100% 100% 99% 99%  Weight:      Height:         General: WDWN female NAD  Head: NCAT sclera not icteric  Neck: Supple.  No masses Lungs: CTA bilaterally without wheezes, rales, or rhonchi. Breathing is unlabored. Heart: RRR with S1 S2 Abdomen: soft NT + BS Lower extremities: L foot bandaged; trace edema RLE  Neuro: A & O  X 3. Moves all extremities spontaneously. Psych:  Responds to questions appropriately with a normal affect. Dialysis Access: RIJ TDC L AVF maturing   Labs: Basic Metabolic Panel: Recent Labs  Lab 01/24/18 0702 01/24/18 1219  NA 137 135  K 3.3* 3.0*  CL 101 103  CO2 20* 22  GLUCOSE 122* 128*  BUN 22* 23*  CREATININE 3.52* 3.59*  CALCIUM 8.4* 7.9*   Liver Function Tests: Recent Labs  Lab 01/24/18 2376 01/24/18 1219  AST 16 17  ALT  11* 7*  ALKPHOS 111 102  BILITOT 0.2* 0.4  PROT 6.3* 5.7*  ALBUMIN 2.9* 2.6*   No results for input(s): LIPASE, AMYLASE in the last 168 hours. No results for input(s): AMMONIA in the last 168 hours. CBC: Recent Labs  Lab 01/24/18 0702 01/24/18 1219  WBC 8.4 6.6  HGB 10.4* 9.5*  HCT 32.6* 29.6*  MCV 98.5 98.3  PLT 434* 377   Cardiac Enzymes: No results for input(s): CKTOTAL, CKMB, CKMBINDEX, TROPONINI in the last 168 hours. CBG: Recent Labs  Lab 01/24/18 0703 01/24/18 0905  GLUCAP 119* 127*   Iron Studies: No results for input(s): IRON, TIBC, TRANSFERRIN, FERRITIN in the last 72 hours. Studies/Results: No results found.  Dialysis Orders:  [facility] 4h 400/800 85.5kg 2/2.25 bath Hep none R IJ TDC/ LUA AVF maturing -Venofer 100mg  IV qHD x10 (to start 4/30) -Calcitriol 0.29mcg qHD  Assessment/Plan: 1. PVD/ s/p L 3rd toe amputation Dr. Donzetta Matters  2. ESRD -  TTS. HD today on schedule  3. Hypertension/volume  - BP controlled/Has been getting under EDW as OP. Titrate weights down as tolerated  4. Anemia  - Hgb 9.5 Not on ESA as outpatient , Getting IV Fe bolus  5. Metabolic bone disease -  Corr Ca ok/Continue Calcitriol. No binders yet.  6. Hx DVT on Coumadin 7. C diff colitis on PO Vanc 8. CHF 9. DM Type 2   Ogechi Larina Earthly PA-C [facility] Pager [phone] 01/24/2018, 3:06 PM     Pt seen, examined and agree w A/P as above.  [name] MD [employer] pager [phone]   01/25/2018, 8:15 AM

## 2018-01-24 NOTE — Transfer of Care (Signed)
Immediate Anesthesia Transfer of Care Note  Patient: Kristen Berry  Procedure(s) Performed: AMPUTATION THIRD TOE LEFT FOOT (Left Foot)  Patient Location: PACU  Anesthesia Type:MAC  Level of Consciousness: awake, alert  and oriented  Airway & Oxygen Therapy: Patient Spontanous Breathing  Post-op Assessment: Report given to RN and Post -op Vital signs reviewed and stable  Post vital signs: Reviewed and stable  Last Vitals:  Vitals Value Taken Time  BP 105/46 01/24/2018  8:59 AM  Temp    Pulse 81 01/24/2018  9:10 AM  Resp 11 01/24/2018  9:10 AM  SpO2 99 % 01/24/2018  9:10 AM  Vitals shown include unvalidated device data.  Last Pain:  Vitals:   01/24/18 0705  TempSrc: Oral  PainSc:          Complications: No apparent anesthesia complications

## 2018-01-25 ENCOUNTER — Encounter (HOSPITAL_COMMUNITY): Payer: Self-pay | Admitting: Vascular Surgery

## 2018-01-25 DIAGNOSIS — E1151 Type 2 diabetes mellitus with diabetic peripheral angiopathy without gangrene: Secondary | ICD-10-CM | POA: Diagnosis not present

## 2018-01-25 LAB — PROTIME-INR
INR: 3.22
Prothrombin Time: 32.7 seconds — ABNORMAL HIGH (ref 11.4–15.2)

## 2018-01-25 MED ORDER — OXYCODONE-ACETAMINOPHEN 5-325 MG PO TABS: 1.0000 | ORAL_TABLET | Freq: Four times a day (QID) | ORAL | 0 refills | Status: DC | PRN

## 2018-01-25 NOTE — Discharge Summary (Signed)
Discharge Summary    Kristen Berry Sep 13, 1956 62 y.o. female  [id number]  Admission Date: 01/24/2018  Discharge Date: 01/25/18  Physician: Thomes Lolling*  Admission Diagnosis: Peripheral artery disease [facility] [I73.9]   HPI:   This is a 62 y.o. female  who is s/p right IJ [facility] placement, left BC AVF and left 2nd toe amputation on 11/10/17 by Dr. Donzetta Matters.  She presents today for follow up studies.  She states that she is dialyzing well via her catheter.  She dialyzes T/T/S at [facility] in [place].  She states that [facility].  She states that she was at [facility] and had difficulty with speech. She did have a carotid duplex that revealed minimal stenosis.  Her Plavix was changed to Brilinta.    She states that about 3 days ago, her left 3rd toe had an ulcer.  She does not remember any injury to her toe.  She has been keeping a dry dressing on her toe.  She denies any fevers.  She is taking po Vancomycin for C diff.  She has not completed the prescription yet.  She states that her diarrhea is down from 15x/day to a couple of times per day.    Hospital Course:  The patient was admitted to the hospital and taken to the operating room on 01/24/2018 and underwent: Left 3rd toe amputation.     Findings: The underlying tissue was healthy and was taken back to the metatarsal head.  I completion the skin approximated well.  The pt tolerated the procedure well and was transported to the PACU in good condition.   By POD 1, she was doing well.  Her incision is clean without active drainage.  There is some bloody drainage on the bandage.  She is instructed to walk with a post op shoe and heel weight bearing only.    Her INR was 3.9 on day of admission and 3.2 on discharge and coumadin has been held since Friday.  She is instructed to contact her physician that manages her coumadin to discuss when to check INR and resume coumadin.  She expresses good  understanding.     She underwent dialysis the afternoon after surgery and is discharged home on POD 1.  After her dressing was changed, she did have some bloody drainage on the bandage.  This was discussed with Dr. Donzetta Matters and he would not take her back to the OR.  He recommended keeping leg elevated while not walking and change dressing as needed.  She will contact us if she has any further issues.   The remainder of the hospital course consisted of increasing mobilization and increasing intake of solids without difficulty.  CBC    Component Value Date/Time   WBC 6.6 01/24/2018 1219   RBC 3.01 (L) 01/24/2018 1219   HGB 9.5 (L) 01/24/2018 1219   HGB 14.3 12/14/2016 1139   HCT 29.6 (L) 01/24/2018 1219   HCT 44.2 12/14/2016 1139   PLT 377 01/24/2018 1219   PLT 429 (H) 12/14/2016 1139   MCV 98.3 01/24/2018 1219   MCV 88.5 12/14/2016 1139   MCH 31.6 01/24/2018 1219   MCHC 32.1 01/24/2018 1219   RDW 18.1 (H) 01/24/2018 1219   RDW 17.6 (H) 12/14/2016 1139   LYMPHSABS 3.5 01/06/2018 1806   LYMPHSABS 1.9 12/14/2016 1139   MONOABS 0.9 01/06/2018 1806   MONOABS 0.7 12/14/2016 1139   EOSABS 0.1 01/06/2018 1806   EOSABS 0.0  10/17/2017 1430   BASOSABS 0.0 01/06/2018 1806   BASOSABS 0.1 12/14/2016 1139    BMET    Component Value Date/Time   NA 135 01/24/2018 1219   NA 137 12/14/2016 1139   K 3.0 (L) 01/24/2018 1219   K 4.2 12/14/2016 1139   CL 103 01/24/2018 1219   CO2 22 01/24/2018 1219   CO2 20 (L) 12/14/2016 1139   GLUCOSE 128 (H) 01/24/2018 1219   GLUCOSE 115 12/14/2016 1139   BUN 23 (H) 01/24/2018 1219   BUN 32.8 (H) 12/14/2016 1139   CREATININE 3.59 (H) 01/24/2018 1219   CREATININE 2.1 (H) 12/14/2016 1139   CALCIUM 7.9 (L) 01/24/2018 1219   CALCIUM 8.3 (L) 11/11/2017 0740   CALCIUM 9.5 12/14/2016 1139   GFRNONAA 13 (L) 01/24/2018 1219   GFRAA 15 (L) 01/24/2018 1219      Discharge Instructions    Discharge patient   Complete by:  As directed    Discharge disposition:   01-Home or Self Care   Discharge patient date:  01/25/2018      Discharge Diagnosis:  Peripheral artery disease ([place]) [I73.9]  Secondary Diagnosis: Patient Active Problem List   Diagnosis Date Noted  . PAD (peripheral artery disease) ([place]) 01/24/2018  . Peripheral artery disease ([place]) 01/24/2018  . C. difficile diarrhea 01/06/2018  . Hypokalemia 01/06/2018  . ESRD (end stage renal disease) ([place]) 01/06/2018  . Prolonged QT interval 01/06/2018  . Hypotension 01/06/2018  . Multiple rib fractures 01/06/2018  . Pressure injury of skin 11/08/2017  . Coronary artery disease due to lipid rich plaque   . Chest pain   . Acute on chronic systolic CHF (congestive heart failure), NYHA class 3 ([place]) 10/18/2017  . PICC (peripherally inserted central catheter) in place   . Acute osteomyelitis of toe of left foot ([place])   . ARF (acute renal failure) ([place]) 10/10/2017  . AKI (acute kidney injury) ([place]) 10/10/2017  . Essential hypertension 12/14/2016  . Abnormal bone xray 06/15/2016  . MGUS (monoclonal gammopathy of unknown significance) 05/18/2016  . Stage 4 chronic kidney disease ([place]) 05/18/2016  . CHF (congestive heart failure) ([place]) 05/18/2016  . Renal insufficiency 06/28/2013  . Other and unspecified hyperlipidemia 06/28/2013  . Obesity, unspecified 06/28/2013  . Pain in limb 06/28/2013  . Type 2 diabetes mellitus with neurologic complication, without long-term current use of insulin ([place]) 06/28/2013   Past Medical History:  Diagnosis Date  . Anemia   . Arthritis    "hands, knees" (10/10/2017)  . Asthma   . CHF (congestive heart failure) ([place])   . Chronic kidney disease (CKD), stage IV (severe) ([place])    Dialysis T/ Th/ Sat  . Coronary artery disease   . H/O Clostridium difficile infection   . High cholesterol   . History of kidney stones   . Hypertension   . PVD (peripheral vascular disease) ([place])    "LLE; will have OR" (10/10/2017)  . Sleep apnea    "never given mask"  (10/10/2017)  . Stroke [facility])    mild stroke mid April - 2019  . Type II diabetes mellitus ([place])    "no RX anymore" (10/10/2017)     Allergies as of 01/25/2018      Reactions   Crestor [rosuvastatin Calcium] Other (See Comments)   Leg pain      Medication List    STOP taking these medications   clopidogrel 75 MG tablet Commonly known as:  PLAVIX   sulfamethoxazole-trimethoprim 400-80 MG tablet Commonly known as:  BACTRIM,SEPTRA     TAKE these medications   acetaminophen 500 MG tablet Commonly known as:  TYLENOL Take 1,500 mg by mouth at bedtime as needed for mild pain or headache.   amLODipine 5 MG tablet Commonly known as:  NORVASC Take 5 mg by mouth daily.   atorvastatin 20 MG tablet Commonly known as:  LIPITOR Take 1 tablet (20 mg total) by mouth daily at 6 PM.   carvedilol 12.5 MG tablet Commonly known as:  COREG Take 2 tablets (25 mg total) by mouth 2 (two) times daily with a meal.   clotrimazole-betamethasone cream Commonly known as:  LOTRISONE Apply 1 application topically daily.   diphenoxylate-atropine 2.5-0.025 MG tablet Commonly known as:  LOMOTIL Take 1 tablet by mouth every 12 (twelve) hours as needed for diarrhea or loose stools.   hydrALAZINE 25 MG tablet Commonly known as:  APRESOLINE Take 2 tablets (50 mg total) by mouth 2 (two) times daily.   nitroGLYCERIN 0.4 MG SL tablet Commonly known as:  NITROSTAT Place 0.4 mg under the tongue every 5 (five) minutes as needed for chest pain.   oxyCODONE-acetaminophen 5-325 MG tablet Commonly known as:  PERCOCET/ROXICET Take 1 tablet by mouth every 6 (six) hours as needed for moderate pain.   pantoprazole 40 MG tablet Commonly known as:  PROTONIX Take 40 mg by mouth daily.   PROAIR HFA 108 (90 Base) MCG/ACT inhaler Generic drug:  albuterol Inhale 2 puffs into the lungs every 6 (six) hours as needed for wheezing or shortness of breath.   saccharomyces boulardii 250 MG capsule Commonly known as:   FLORASTOR Take 1 capsule (250 mg total) by mouth 2 (two) times daily.   ticagrelor 90 MG Tabs tablet Commonly known as:  BRILINTA Take 90 mg by mouth 2 (two) times daily.   vancomycin 125 MG capsule Commonly known as:  VANCOCIN Take 125 mg by mouth 4 (four) times daily.   warfarin 5 MG tablet Commonly known as:  COUMADIN Take 1 tablet (5 mg total) by mouth daily. Check INR at [facility] on Saturday      Instructions: [facility]  Discharge instructions   Please refer to the following instruction for your post-procedure care. Your surgeon or physician assistant will discuss any changes with you.  Activity  You can slowly return to normal activities during the month after your surgery. Avoid strenuous activity and heavy lifting until your doctor tells you it's OK. Avoid activities such as vacuuming or swinging a golf club. Do not drive until your doctor give the OK and you are no longer taking prescription pain medications. It is also normal to have difficulty with sleep habits, eating and bowel movement after surgery. These will go away with time. Heel weight bearing only and walk with your post op shoe.  Bathing/Showering  You may shower after you go home. Do not soak in a bathtub, hot tub, or swim until the incision heals completely.  Incision Care  Clean your incision with mild soap and water. Shower every day. Pat the area dry with a clean towel. Dress wound with 4x4 gauze, kerlix wrap and ace wrap daily.  Do not apply any ointments or creams to your incision.  If you have staples or sutures along your incision they will be removed at your post-op appointment.  Diet  Resume your normal diet. There are no special food restrictions following this procedure. A low fat/ low cholesterol diet is recommended for all patients with  vascular disease. In order to heal from your surgery, it is CRITICAL to get adequate nutrition. Your body requires  vitamins, minerals, and protein. Vegetables are the best source of vitamins and minerals. Vegetables also provide the perfect balance of protein. Processed food has little nutritional value, so try to avoid this.  Medications  Resume taking all your medications unless your doctor or nurse practitioner tells you not to. If your incision is causing pain, you may take over-the-counter pain relievers such as acetaminophen (Tylenol). If you were prescribed a stronger pain medication, please aware these medication can cause nausea and constipation. Prevent nausea by taking the medication with a snack or meal. Avoid constipation by drinking plenty of fluids and eating foods with high amount of fiber, such as fruits, vegetables, and grains. Take Colase 100 mg (an over-the-counter stool softener) twice a day as needed for constipation. Do not take Tylenol if you are taking prescription pain medications.  Hold your coumadin today and call your physician to let him know that your INR is 3.22 today (01/25/18) and your coumadin has been held since Friday and get further instructions on when to have your INR drawn and when to restart your coumadin.   Follow Up  Our office will schedule a follow up appointment 3-4 weeks following discharge.  Please call us immediately for any of the following conditions  -Severe or worsening pain in your legs or feet while at rest or while walking  -Increase pain, redness, warmth, or drainage (pus) from your incision site(s) -Fever of 101 degree or higher   Reduce your risk of vascular disease  Stop smoking. If you would like help call [facility] at 1-800-QUIT-NOW 616-339-5163) or [facility] at [phone].  Manage your cholesterol Maintain a desired weight Control your diabetes weight Control your diabetes Keep your blood pressure down  If you have any questions, please call the office at [phone]  Prescriptions given: Roxicet #20 No Refill  Disposition:  home  Patient's condition: is Good  Follow up: 1. Dr. Donzetta Matters in 3-4 weeks   Leontine Locket, PA-C [facility] [phone] 01/25/2018  8:15 AM

## 2018-01-25 NOTE — Care Management CC44 (Signed)
Condition Code 44 Documentation Completed  Patient Details  Name: Kristen Berry MRN: [id number] Date of Birth: Nov 08, 1955   Condition Code 44 given:  Yes Patient signature on Condition Code 44 notice:  Yes Documentation of 2 MD's agreement:  Yes Code 44 added to claim:  Yes    Kristen Cardinal, RN 01/25/2018, 9:51 AM

## 2018-01-25 NOTE — Progress Notes (Addendum)
  Progress Note    01/25/2018 8:00 AM 1 Day Post-Op  Subjective:  No complaints; ready to go home.   Afebrile   Vitals:   01/25/18 0546 01/25/18 0740  BP: (!) 131/43 (!) 141/58  Pulse: 87 83  Resp: 16 18  Temp: 98.2 F (36.8 C) 98.2 F (36.8 C)  SpO2: 100% 100%    Physical Exam: General:  No distress Lungs:  Non labored Incisions:   Clean and dry without active drainage.  Bloody drainage on bandage.    CBC    Component Value Date/Time   WBC 6.6 01/24/2018 1219   RBC 3.01 (L) 01/24/2018 1219   HGB 9.5 (L) 01/24/2018 1219   HGB 14.3 12/14/2016 1139   HCT 29.6 (L) 01/24/2018 1219   HCT 44.2 12/14/2016 1139   PLT 377 01/24/2018 1219   PLT 429 (H) 12/14/2016 1139   MCV 98.3 01/24/2018 1219   MCV 88.5 12/14/2016 1139   MCH 31.6 01/24/2018 1219   MCHC 32.1 01/24/2018 1219   RDW 18.1 (H) 01/24/2018 1219   RDW 17.6 (H) 12/14/2016 1139   LYMPHSABS 3.5 01/06/2018 1806   LYMPHSABS 1.9 12/14/2016 1139   MONOABS 0.9 01/06/2018 1806   MONOABS 0.7 12/14/2016 1139   EOSABS 0.1 01/06/2018 1806   EOSABS 0.0 10/17/2017 1430   BASOSABS 0.0 01/06/2018 1806   BASOSABS 0.1 12/14/2016 1139    BMET    Component Value Date/Time   NA 135 01/24/2018 1219   NA 137 12/14/2016 1139   K 3.0 (L) 01/24/2018 1219   K 4.2 12/14/2016 1139   CL 103 01/24/2018 1219   CO2 22 01/24/2018 1219   CO2 20 (L) 12/14/2016 1139   GLUCOSE 128 (H) 01/24/2018 1219   GLUCOSE 115 12/14/2016 1139   BUN 23 (H) 01/24/2018 1219   BUN 32.8 (H) 12/14/2016 1139   CREATININE 3.59 (H) 01/24/2018 1219   CREATININE 2.1 (H) 12/14/2016 1139   CALCIUM 7.9 (L) 01/24/2018 1219   CALCIUM 8.3 (L) 11/11/2017 0740   CALCIUM 9.5 12/14/2016 1139   GFRNONAA 13 (L) 01/24/2018 1219   GFRAA 15 (L) 01/24/2018 1219    INR    Component Value Date/Time   INR 3.22 01/25/2018 0430     Intake/Output Summary (Last 24 hours) at 01/25/2018 0800 Last data filed at 01/25/2018 0546 Gross per 24 hour  Intake 200 ml  Output 5  ml  Net 195 ml     Assessment:  62 y.o. female is s/p:  Left third toe amputation  1 Day Post-Op  Plan: -pt doing well this am-bandage removed and toe amp site is clean.  She has post op shoe at home and will use that.  She will be heel weight bearing. -DVT prophylaxis:  INR is 3.22 this am and her coumadin has been held since Friday.  She will hold her coumadin today and call her MD when she gets home for further instructions.  I discussed with her that her INR today is 3.22 and she understands.  -she may need fistulogram in the future.  -discharge home and f/u with Dr. Donzetta Matters in 3-4 weeks.  She will call sooner if she has any issues.    Leontine Locket, PA-C [facility] [phone] 01/25/2018 8:00 AM   I have interviewed and examined patient with PA and agree with assessment and plan above.   Chadwin Fury C. Donzetta Matters, MD [facility] [place] Office: [phone] Pager: [phone]

## 2018-01-25 NOTE — Care Management CC44 (Signed)
Condition Code 44 Documentation Completed  Patient Details  Name: Kristen Berry MRN: [id number] Date of Birth: 02-24-1956   Condition Code 44 given:  Yes Patient signature on Condition Code 44 notice:  Yes Documentation of 2 MD's agreement:  Yes Code 44 added to claim:  Yes    Kristen Cardinal, RN 01/25/2018, 9:52 AM

## 2018-01-25 NOTE — Discharge Instructions (Signed)
° °  [facility]  Discharge instructions   Please refer to the following instruction for your post-procedure care. Your surgeon or physician assistant will discuss any changes with you.  Activity  You can slowly return to normal activities during the month after your surgery. Avoid strenuous activity and heavy lifting until your doctor tells you it's OK.  Do not drive until your doctor give the OK and you are no longer taking prescription pain medications. It is also normal to have difficulty with sleep habits, eating and bowel movement after surgery. These will go away with time. Heel weight bearing only and walk with your post op shoe.  Bathing/Showering  You may shower after you go home. Do not soak in a bathtub, hot tub, or swim until the incision heals completely.  Incision Care  Clean your incision with mild soap and water. Shower every day. Pat the area dry with a clean towel. Dress wound with 4x4 gauze, kerlix wrap and ace wrap daily.  Do not apply any ointments or creams to your incision.  If you have staples or sutures along your incision they will be removed at your post-op appointment.  Diet  Resume your normal diet. There are no special food restrictions following this procedure. A low fat/ low cholesterol diet is recommended for all patients with vascular disease. In order to heal from your surgery, it is CRITICAL to get adequate nutrition. Your body requires vitamins, minerals, and protein. Vegetables are the best source of vitamins and minerals. Vegetables also provide the perfect balance of protein. Processed food has little nutritional value, so try to avoid this.  Medications  Resume taking all your medications unless your doctor or nurse practitioner tells you not to. If your incision is causing pain, you may take over-the-counter pain relievers such as acetaminophen (Tylenol). If you were prescribed a stronger pain medication, please aware  these medication can cause nausea and constipation. Prevent nausea by taking the medication with a snack or meal. Avoid constipation by drinking plenty of fluids and eating foods with high amount of fiber, such as fruits, vegetables, and grains. Take Colace 100 mg (an over-the-counter stool softener) twice a day as needed for constipation. Do not take Tylenol if you are taking prescription pain medications.  Hold your coumadin today and call your physician to let him know that your INR is 3.22 today (01/25/18) and your coumadin has been held since Friday and get further instructions on when to have your INR drawn and when to restart your coumadin.   Follow Up  Our office will schedule a follow up appointment 3-4 weeks following discharge.  Please call us immediately for any of the following conditions  -Severe or worsening pain in your legs or feet while at rest or while walking  -Increase pain, redness, warmth, or drainage (pus) from your incision site(s) -Fever of 101 degree or higher   Reduce your risk of vascular disease  Stop smoking. If you would like help call [facility] at 1-800-QUIT-NOW 234-881-3056) or [place] at [phone].  Manage your cholesterol Maintain a desired weight Control your diabetes weight Control your diabetes Keep your blood pressure down  If you have any questions, please call the office at [phone]

## 2018-01-25 NOTE — Care Management Obs Status (Signed)
[facility] NOTIFICATION   Patient Details  Name: Kristen Berry MRN: [id number] Date of Birth: 1956-07-28   Medicare Observation Status Notification Given:  Yes    Kristen Cardinal, RN 01/25/2018, 9:51 AM

## 2018-01-25 NOTE — Care Management Note (Addendum)
Case Management Note  Patient Details  Name: Kristen Berry MRN: [id number] Date of Birth: 1956/01/15  Subjective/Objective:      Admitted for s/p third toe amputation.         Action/Plan: Patient with discharge orders home today.  In to speak with patient.   Home DME: Rollator.  Patient stated she had a request sent to DME provider for bedside commode/Rollator and has not received it yet.  Unable to remember DME Provider at this time, but Dr. Donzetta Matters wrote the order.  NCM encouraged patient to contact company as soon as she gets home.  Patient verbalized understanding.  Prior to admission patient lived with daughter and grandchildren.  At discharge will be returning to same living condition.  Patient has transportation home at discharge (daughter).  NCM spoke with patient regarding need for DME: rollator, 3-in-1 patient offered choice, selected [facility].  NCM called referral to [facility] discharge liaison with [facility] and arranged for DME to be delivered to patient home.    Expected Discharge Date:  01/25/18               Expected Discharge Plan:   Home self care In-House Referral:   N/A Discharge planning Services   CM consult Post Acute Care Choice:   Home DME Choice offered to:   Patient DME Arranged:  3-N-1, (Rollator) DME Agency:  [place]: N/A  HH Agency:   N/A  Status of Service:   In progress will continue to monitor.  Additional Comments: Call place to "Tiffany" with [facility] at [phone] who was familiar with patient; NCM ask if she could follow up and see what happened with patient receiving DME: rollator, and 3-in-1 that should have been delivered.  Patient states it's been around 10 weeks ago.  Per Tiffany she does not see where the [facility] branch in [place] was working on any DME request for patient.  Kristen Cardinal, RN 01/25/2018, 9:56 AM

## 2018-01-25 NOTE — Progress Notes (Signed)
ANTICOAGULATION CONSULT NOTE - Follow-Up Consult  Pharmacy Consult for warfarin Indication: Hx DVT  Patient Measurements: Height: 5\' 8"  (172.7 cm) Weight: 193 lb 5.5 oz (87.7 kg) IBW/kg (Calculated) : 63.9   Vital Signs: Temp: 98.2 F (36.8 C) (05/01 0740) Temp Source: Oral (05/01 0740) BP: 141/58 (05/01 0740) Pulse Rate: 83 (05/01 0740)  Labs: Recent Labs    01/24/18 0702 01/24/18 0733 01/24/18 1219 01/25/18 0430  HGB 10.4*  --  9.5*  --   HCT 32.6*  --  29.6*  --   PLT 434*  --  377  --   APTT  --  70*  --   --   LABPROT  --  38.6*  --  32.7*  INR  --  3.99  --  3.22  CREATININE 3.52*  --  3.59*  --     Estimated Creatinine Clearance: 18.8 mL/min (A) (by C-G formula based on SCr of 3.59 mg/dL (H)).  Assessment: 62 yo female admitted for L 3rd toe amputation. She is ESRD on HD-TuThSat. 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 Warfarin was held for toe amputation - completed 5/1.    INR today remains elevated (INR 3.22). Per Dr. Claretha Cooper note - he has already instructed her to hold her warfarin today and call her MD this afternoon for further instructions.   Goal of Therapy:  INR 2-3 Monitor platelets by anticoagulation protocol: Yes   Plan:  - Hold warfarin again today - Will continue to monitor for any signs/symptoms of bleeding and will follow up with PT/INR in the a.m. (if still here)  Thank you for allowing pharmacy to be a part of this patient's care.  Alycia Rossetti, PharmD, BCPS Clinical Pharmacist Pager: [phone] Clinical phone for 01/25/2018 from 7a-3:30p: [phone] If after 3:30p, please call main pharmacy at: [phone] 01/25/2018 11:14 AM

## 2018-01-25 NOTE — Progress Notes (Signed)
  [facility] Progress Note   Subjective:  Going home today. Tolerated HD without c/os   Objective Vitals:   01/24/18 1830 01/24/18 1932 01/25/18 0546 01/25/18 0740  BP: 129/73 (!) 128/58 (!) 131/43 (!) 141/58  Pulse: (!) 122 (!) 114 87 83  Resp: 18 18 16 18   Temp: 98 F (36.7 C) 98.3 F (36.8 C) 98.2 F (36.8 C) 98.2 F (36.8 C)  TempSrc: Oral Oral Oral Oral  SpO2: 98% 98% 100% 100%  Weight:      Height:       Physical Exam General: WNWD female NAD Heart: RRR Lungs: CTAB Abdomen: soft NT Extremities: L foot bandaged, bloody drainage Dialysis Access: RIJ TDC L AVF maturing   Dialysis Orders:  [facility] 4h 400/800 85.5kg 2/2.25 bath Hep none R IJ TDC/ LUA AVF maturing -Venofer 100mg  IV qHD x10 (to start 4/30) -Calcitriol 0.73mcg qHD  Assessment/Plan: 1. PVD/ s/p L 3rd toe amputation Dr. Donzetta Matters 4/30   2. ESRD -  TTS. Next HD 5/2 at outpatient center  3. Hypertension/volume  - BP controlled/Has been getting under EDW as OP. Titrate weights down as tolerated  4. Anemia  - Hgb 9.5 Not on ESA as outpatient , Getting IV Fe bolus  5. Metabolic bone disease -  Corr Ca ok/Continue Calcitriol. No binders yet.  6. Hx DVT on Coumadin 7. C diff colitis on PO Vanc 8. CHF 9. DM Type 2     Lynnda Child PA-C [facility] Pager [phone] 01/25/2018,9:39 AM  LOS: 1 day   Additional Objective Labs: Basic Metabolic Panel: Recent Labs  Lab 01/24/18 0702 01/24/18 1219  NA 137 135  K 3.3* 3.0*  CL 101 103  CO2 20* 22  GLUCOSE 122* 128*  BUN 22* 23*  CREATININE 3.52* 3.59*  CALCIUM 8.4* 7.9*   CBC: Recent Labs  Lab 01/24/18 0702 01/24/18 1219  WBC 8.4 6.6  HGB 10.4* 9.5*  HCT 32.6* 29.6*  MCV 98.5 98.3  PLT 434* 377   Blood Culture    Component Value Date/Time   SDES BLOOD LEFT HAND 10/19/2017 1500   SPECREQUEST  10/19/2017 1500    BOTTLES DRAWN AEROBIC AND ANAEROBIC Blood Culture adequate volume   CULT NO GROWTH 5  DAYS 10/19/2017 1500   REPTSTATUS 10/24/2017 FINAL 10/19/2017 1500    Cardiac Enzymes: No results for input(s): CKTOTAL, CKMB, CKMBINDEX, TROPONINI in the last 168 hours. CBG: Recent Labs  Lab 01/24/18 0703 01/24/18 0905  GLUCAP 119* 127*   Iron Studies: No results for input(s): IRON, TIBC, TRANSFERRIN, FERRITIN in the last 72 hours. Lab Results  Component Value Date   INR 3.22 01/25/2018   INR 3.99 01/24/2018   INR 1.86 01/12/2018   Medications: . sodium chloride    . sodium chloride     . amLODipine  5 mg Oral Daily  . atorvastatin  20 mg Oral q1800  . carvedilol  25 mg Oral BID WC  . hydrALAZINE  50 mg Oral BID  . pantoprazole  40 mg Oral Daily  . saccharomyces boulardii  250 mg Oral BID  . ticagrelor  90 mg Oral BID  . vancomycin  125 mg Oral QID  . Warfarin - Pharmacist Dosing Inpatient   Does not apply [phone]

## 2018-01-25 NOTE — Progress Notes (Signed)
Discharge instructions, medications/prescriptions and f/u appt discussed and reviewed with pt, verbalized understanding. Copy of AVS given. IV removed, site clean and dry, catheter intact. Pt patiently waiting for her ride.

## 2018-02-24 ENCOUNTER — Ambulatory Visit: Payer: Medicare HMO

## 2018-04-21 ENCOUNTER — Ambulatory Visit: Payer: Medicare HMO | Admitting: Diagnostic Neuroimaging

## 2018-04-21 ENCOUNTER — Telehealth: Payer: Self-pay | Admitting: *Deleted

## 2018-04-21 NOTE — Telephone Encounter (Signed)
Pt did not show for appointment today

## 2018-04-24 ENCOUNTER — Encounter: Payer: Self-pay | Admitting: Diagnostic Neuroimaging

## 2018-05-19 ENCOUNTER — Ambulatory Visit: Payer: Medicare HMO | Admitting: Vascular Surgery

## 2018-05-22 ENCOUNTER — Ambulatory Visit: Payer: Medicare HMO | Admitting: Vascular Surgery

## 2018-05-31 DIAGNOSIS — R197 Diarrhea, unspecified: Secondary | ICD-10-CM | POA: Insufficient documentation

## 2018-06-23 ENCOUNTER — Encounter: Payer: Self-pay | Admitting: Vascular Surgery

## 2018-06-23 ENCOUNTER — Other Ambulatory Visit: Payer: Self-pay

## 2018-06-23 ENCOUNTER — Encounter: Payer: Self-pay | Admitting: *Deleted

## 2018-06-23 ENCOUNTER — Ambulatory Visit (INDEPENDENT_AMBULATORY_CARE_PROVIDER_SITE_OTHER): Payer: Medicare HMO | Admitting: Vascular Surgery

## 2018-06-23 ENCOUNTER — Other Ambulatory Visit: Payer: Self-pay | Admitting: *Deleted

## 2018-06-23 VITALS — BP 140/62 | HR 70 | Temp 98.3°F | Resp 16 | Ht 68.0 in | Wt 178.9 lb

## 2018-06-23 DIAGNOSIS — I739 Peripheral vascular disease, unspecified: Secondary | ICD-10-CM

## 2018-06-23 DIAGNOSIS — Z992 Dependence on renal dialysis: Secondary | ICD-10-CM | POA: Diagnosis not present

## 2018-06-23 DIAGNOSIS — N186 End stage renal disease: Secondary | ICD-10-CM | POA: Diagnosis not present

## 2018-06-23 NOTE — Progress Notes (Signed)
Patient ID: Kristen Berry, female   DOB: 30-Oct-1955, 62 y.o.   MRN: [id number]  Reason for Consult: Follow-up (post L foot 3rd toe amp 4/30, f/u fistula - HD TTS)   Referred by [place], Sarah T, MD  Subjective:     HPI:  Kristen Berry is a 62 y.o. female with a history of end-stage renal disease currently on dialysis Tuesday Thursday Saturdays via catheter.  She has a left arm cephalic vein fistula and is previously undergone second and third toe amputations on the left.  Currently takes Brilinta daily.  She is here today to have her amputation site evaluated as well as her fistula site.  Past Medical History:  Diagnosis Date  . Anemia   . Arthritis    "hands, knees" (10/10/2017)  . Asthma   . CHF (congestive heart failure) ([place])   . Chronic kidney disease (CKD), stage IV (severe) ([place])    Dialysis T/ Th/ Sat  . Coronary artery disease   . H/O Clostridium difficile infection   . High cholesterol   . History of kidney stones   . Hypertension   . PVD (peripheral vascular disease) ([place])    "LLE; will have OR" (10/10/2017)  . Sleep apnea    "never given mask" (10/10/2017)  . Stroke [facility])    mild stroke mid April - 2019  . Type II diabetes mellitus ([place])    "no RX anymore" (10/10/2017)   Family History  Problem Relation Age of Onset  . Hypertension Father   . Heart failure Maternal Grandmother   . Heart failure Maternal Grandfather    Past Surgical History:  Procedure Laterality Date  . AMPUTATION Left 11/10/2017   Procedure: AMPUTATION DIGIT SECOND TOE LEFT FOOT;  Surgeon: Waynetta Sandy, MD;  Location: [place];  Service: Vascular;  Laterality: Left;  . AMPUTATION Left 01/24/2018   Procedure: AMPUTATION THIRD TOE LEFT FOOT;  Surgeon: Waynetta Sandy, MD;  Location: [place];  Service: Vascular;  Laterality: Left;  . AORTOGRAM N/A 11/03/2017   Procedure: Ultrasound Guided Cannulation Right Common Femoral Artery;  Aortagram with Left Lower Extremity Arteriogram;  Attempted Treatment Left Superficial Femoral Artery; Percutaneous Closure Right Common Femoral Arteriotomy with Proglide Device;  Surgeon: Waynetta Sandy, MD;  Location: [facility] OR;  Service: Vascular;  Laterality: N/A;  . AV FISTULA PLACEMENT Left 11/10/2017   Procedure: ARTERIOVENOUS (AV) FISTULA CREATION LEFT UPPER ARM;  Surgeon: Waynetta Sandy, MD;  Location: [place];  Service: Vascular;  Laterality: Left;  . HERNIA REPAIR  1950s  . INSERTION OF DIALYSIS CATHETER Right 11/10/2017   Procedure: INSERTION OF TUNNELED DIALYSIS CATHETER RIGHT INTERNAL JUGULAR PLACEMENT;  Surgeon: Waynetta Sandy, MD;  Location: [place];  Service: Vascular;  Laterality: Right;  . IR FLUORO GUIDE CV LINE RIGHT  10/19/2017  . IR US GUIDE VASC ACCESS RIGHT  10/19/2017  . LEFT HEART CATH AND CORONARY ANGIOGRAPHY N/A 11/01/2017   Procedure: LEFT HEART CATH AND CORONARY ANGIOGRAPHY;  Surgeon: Belva Crome, MD;  Location: [place] CV LAB;  Service: Cardiovascular;  Laterality: N/A;  . PERIPHERAL VASCULAR INTERVENTION Left 11/07/2017   Procedure: PERIPHERAL VASCULAR INTERVENTION;  Surgeon: Waynetta Sandy, MD;  Location: [place] CV LAB;  Service: Cardiovascular;  Laterality: Left;  left SFA  . SHOULDER OPEN ROTATOR CUFF REPAIR Left   . TUBAL LIGATION    . ULTRASOUND GUIDANCE FOR VASCULAR ACCESS  11/01/2017   Procedure: Ultrasound Guidance For Vascular Access;  Surgeon: Daneen Schick  W, MD;  Location: [place] CV LAB;  Service: Cardiovascular;;    Short Social History:  Social History   Tobacco Use  . Smoking status: Never Smoker  . Smokeless tobacco: Never Used  Substance Use Topics  . Alcohol use: No    Allergies  Allergen Reactions  . Crestor [Rosuvastatin Calcium] Other (See Comments)    Leg pain    Current Outpatient Medications  Medication Sig Dispense Refill  . acetaminophen (TYLENOL) 500 MG tablet Take 1,500 mg by mouth at bedtime as needed for mild pain or  headache.     . albuterol (PROAIR HFA) 108 (90 Base) MCG/ACT inhaler Inhale 2 puffs into the lungs every 6 (six) hours as needed for wheezing or shortness of breath.    [name] amLODipine (NORVASC) 5 MG tablet Take 5 mg by mouth daily.    [name] atorvastatin (LIPITOR) 20 MG tablet Take 1 tablet (20 mg total) by mouth daily at 6 PM. 30 tablet 0  . clotrimazole-betamethasone (LOTRISONE) cream Apply 1 application topically daily.  0  . diphenoxylate-atropine (LOMOTIL) 2.5-0.025 MG tablet Take 1 tablet by mouth every 12 (twelve) hours as needed for diarrhea or loose stools.     . hydrALAZINE (APRESOLINE) 25 MG tablet Take 2 tablets (50 mg total) by mouth 2 (two) times daily.    . nitroGLYCERIN (NITROSTAT) 0.4 MG SL tablet Place 0.4 mg under the tongue every 5 (five) minutes as needed for chest pain.    . pantoprazole (PROTONIX) 40 MG tablet Take 40 mg by mouth daily.    [name] saccharomyces boulardii (FLORASTOR) 250 MG capsule Take 1 capsule (250 mg total) by mouth 2 (two) times daily. 20 capsule 0  . ticagrelor (BRILINTA) 90 MG TABS tablet Take 90 mg by mouth 2 (two) times daily.     [name] warfarin (COUMADIN) 5 MG tablet Take 1 tablet (5 mg total) by mouth daily. Check INR at Next Dialysis on Saturday 30 tablet 0  . carvedilol (COREG) 12.5 MG tablet Take 2 tablets (25 mg total) by mouth 2 (two) times daily with a meal.    . oxyCODONE-acetaminophen (PERCOCET/ROXICET) 5-325 MG tablet Take 1 tablet by mouth every 6 (six) hours as needed for moderate pain. (Patient not taking: Reported on 06/23/2018) 20 tablet 0  . vancomycin (VANCOCIN) 125 MG capsule Take 125 mg by mouth 4 (four) times daily.     No current facility-administered medications for this visit.     Review of Systems  Constitutional:  Constitutional negative. Eyes: Eyes negative.  Respiratory: Respiratory negative.  Cardiovascular: Cardiovascular negative.  GI: Gastrointestinal negative.  Musculoskeletal: Musculoskeletal negative.  Skin: Skin negative.    Neurological: Neurological negative. Hematologic: Hematologic/lymphatic negative.  Psychiatric: Psychiatric negative.        Objective:  Objective   Vitals:   06/23/18 1034  BP: 140/62  Pulse: 70  Resp: 16  Temp: 98.3 F (36.8 C)  TempSrc: Oral  SpO2: 99%  Weight: 178 lb 14.4 oz (81.1 kg)  Height: 5\' 8"  (1.727 m)   Body mass index is 27.2 kg/m.  Physical Exam  HENT:  Head: Normocephalic.  Eyes: Pupils are equal, round, and reactive to light.  Neck: Normal range of motion.  Cardiovascular: Normal rate.  Pulses:      Radial pulses are 2+ on the right side, and 2+ on the left side.       Popliteal pulses are 2+ on the left side.  Pulmonary/Chest: Effort normal.  Abdominal: Soft.  Musculoskeletal: Normal range of  motion. She exhibits no edema.  Well-healed amputation site left third toe. Fistula is palpable at the antecubitum but not above that.  Neurological: She is alert.  Skin: Skin is warm and dry. Capillary refill takes less than 2 seconds.  Psychiatric: She has a normal mood and affect. Her behavior is normal. Judgment and thought content normal.         Assessment/Plan:     62 year old follows up after having left third toe amputation.  This is now well-healed sutures removed today in the office.  She does also have a fistula that is failed to mature in the left upper arm.  We will get her set up for a fistulogram in the near future with possible intervention.  She will possibly need conversion to a graft.     Waynetta Sandy MD [facility]

## 2018-07-17 ENCOUNTER — Ambulatory Visit (HOSPITAL_COMMUNITY): Admission: RE | Admit: 2018-07-17 | Payer: Medicare HMO | Source: Ambulatory Visit | Admitting: Vascular Surgery

## 2018-07-17 ENCOUNTER — Encounter (HOSPITAL_COMMUNITY): Admission: RE | Payer: Self-pay | Source: Ambulatory Visit

## 2018-07-17 ENCOUNTER — Telehealth: Payer: Self-pay | Admitting: *Deleted

## 2018-07-17 SURGERY — A/V FISTULAGRAM
Anesthesia: LOCAL | Laterality: Left

## 2018-07-17 NOTE — Telephone Encounter (Signed)
Patient called answering service this am and cancelled fistulogram. Message left on patient's voice mail to call this office to reschedule.

## 2018-07-22 IMAGING — DX DG CHEST 1V PORT
1 series · 1 of 1 positions shown · non-contrast
Comparison: 10/24/2017

CLINICAL DATA: Shortness of breath tonight.

EXAM:
PORTABLE CHEST 1 VIEW

[chest]
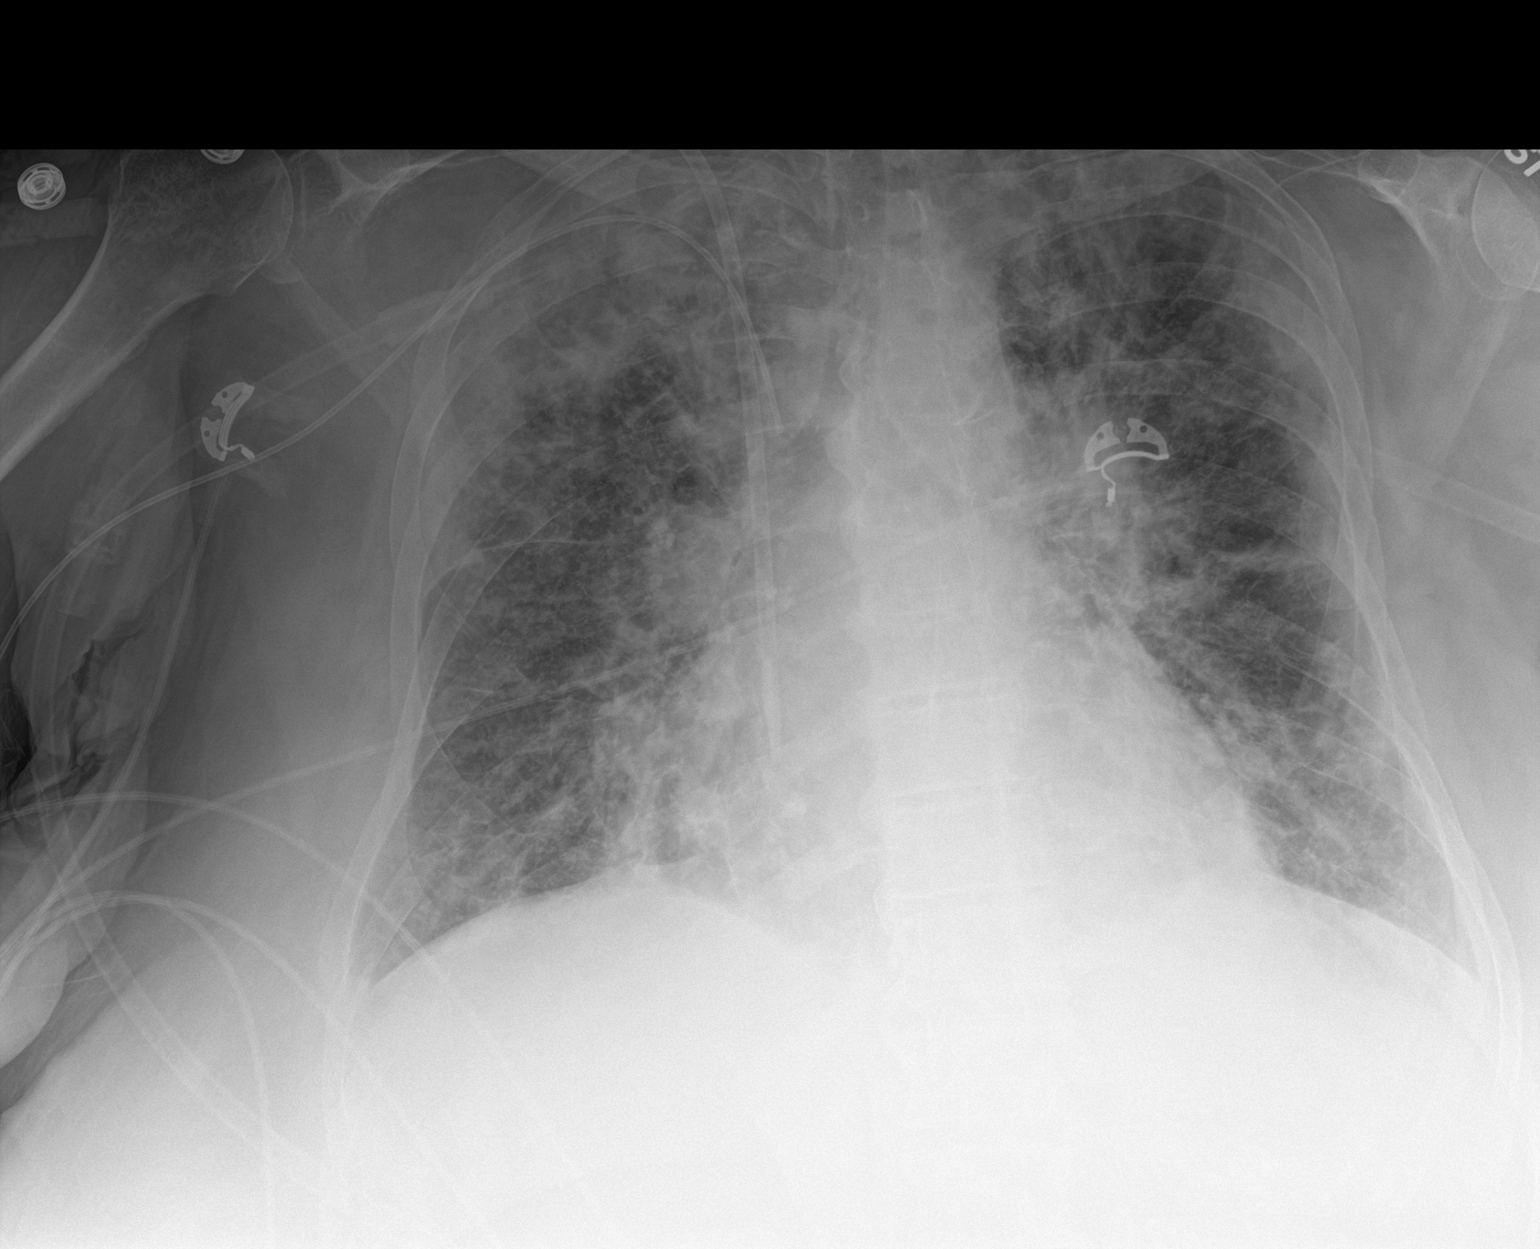

[1 of 1 positions shown; findings below may reference images not displayed]

FINDINGS: Right central venous catheter with tip over the right atrium. Right
PICC line with tip over the low SVC region. No pneumothorax. Shallow
inspiration. Cardiac enlargement with pulmonary vascular congestion.
Diffuse interstitial and airspace infiltrates in the lungs likely
representing edema. More dense consolidation over the right upper
lung could represent superimposed pneumonia. Mild progression since
previous study. No blunting of costophrenic angles. No pneumothorax.
Calcification of the aorta.
IMPRESSION: Appliances appear in satisfactory position. Cardiac enlargement with
vascular congestion. Diffuse airspace and interstitial infiltration
likely represents edema. Possible superimposed consolidation over
the right upper lung.

## 2018-07-26 IMAGING — DX DG CHEST 1V PORT
1 series · 1 of 1 positions shown · non-contrast
Comparison: 10/27/2017, 12/13/2014

CLINICAL DATA: Shortness of breath and chest pressure since last
night

EXAM:
PORTABLE CHEST 1 VIEW

[chest]
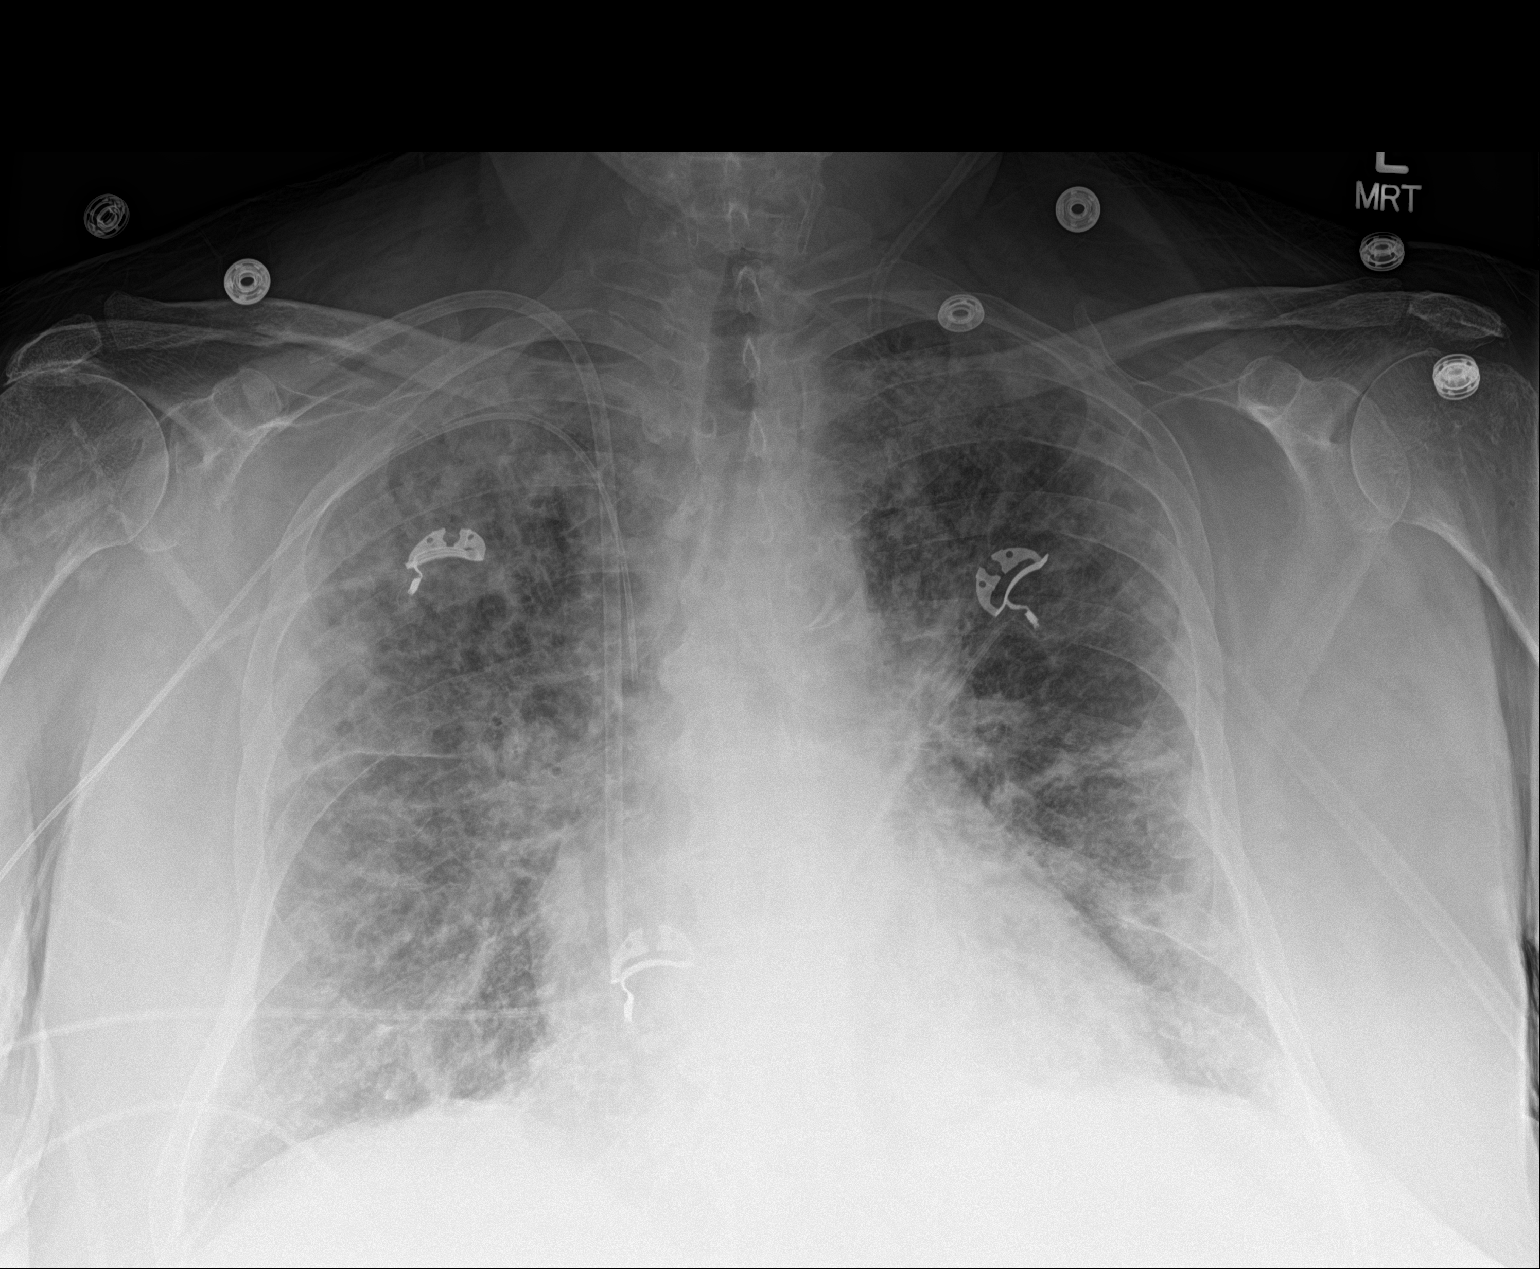

[1 of 1 positions shown; findings below may reference images not displayed]

FINDINGS: Diffuse bilateral interstitial and alveolar airspace disease. More
focal airspace disease in the upper lobes bilaterally, right greater
than left. No pleural effusion or pneumothorax. Stable
cardiomediastinal silhouette. No acute osseous abnormality. Large
bore right-sided central venous catheter with the tip projecting
over the right atrium. Right-sided PICC line with the tip projecting
over the SVC. No acute osseous abnormality.
IMPRESSION: 1. Bilateral chronic interstitial lung disease. Superimposed
bilateral interstitial and alveolar airspace opacities with more
prominent airspace disease in the upper lobes, right greater than
left. Differential considerations include interstitial edema versus
multifocal pneumonia superimposed upon chronic lung disease.

## 2018-08-07 IMAGING — DX DG CHEST 1V PORT
1 series · 1 of 1 positions shown · non-contrast
Comparison: Portable chest x-ray October 29, 2017

CLINICAL DATA: Placement of a dialysis catheter.

EXAM:
PORTABLE CHEST 1 VIEW

[chest]
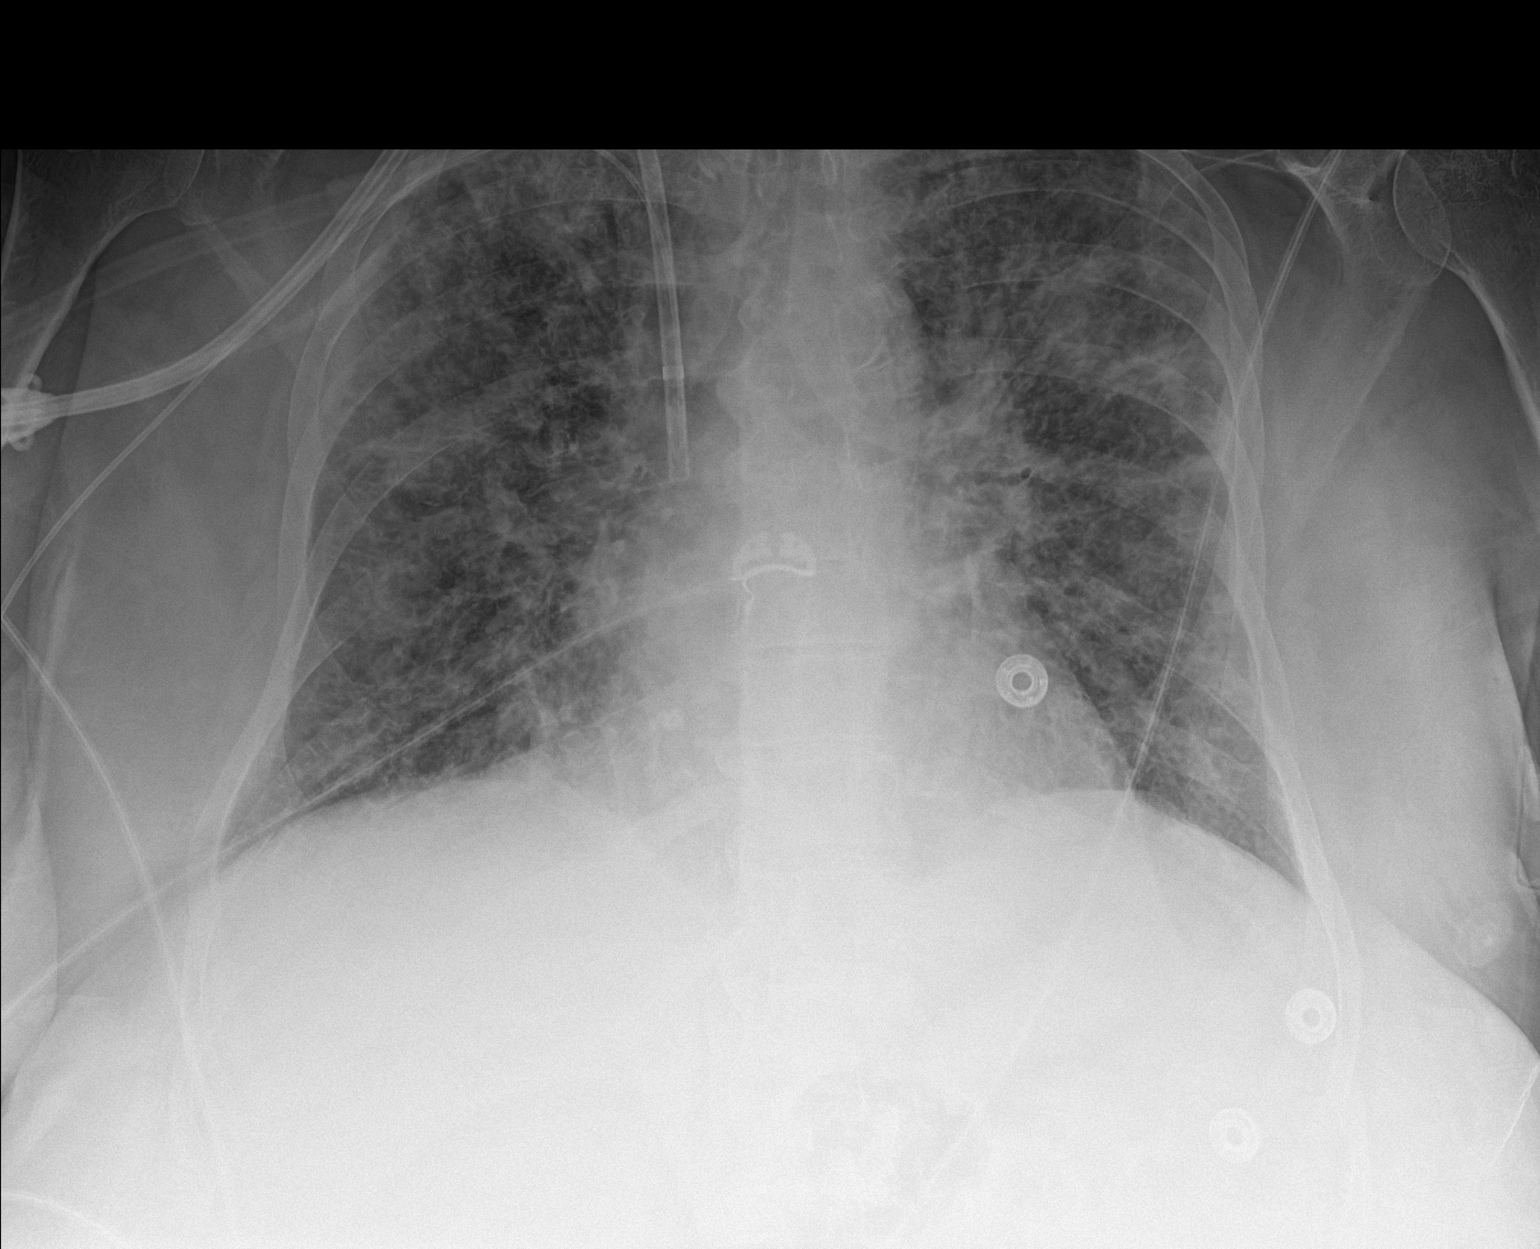

[1 of 1 positions shown; findings below may reference images not displayed]

FINDINGS: The dialysis catheter tip projects over the midportion of the SVC.
There is no postprocedure pneumothorax. There are fluffy
interstitial and alveolar airspace opacities bilaterally. The heart
is top-normal in size. The pulmonary vascularity is prominent
centrally but more distinct than on the earlier study. There is no
pleural effusion. There calcification in the wall of the aortic
arch.
IMPRESSION: No postprocedure complication following dialysis catheter placement.

Bilateral interstitial and alveolar opacities consistent with
pneumonia or pulmonary edema. Slight improvement since the earlier
study.

Thoracic aortic atherosclerosis.

## 2018-09-13 ENCOUNTER — Other Ambulatory Visit: Payer: Self-pay

## 2018-09-13 DIAGNOSIS — Z992 Dependence on renal dialysis: Principal | ICD-10-CM

## 2018-09-13 DIAGNOSIS — N186 End stage renal disease: Secondary | ICD-10-CM

## 2018-10-31 ENCOUNTER — Telehealth: Payer: Self-pay

## 2018-10-31 ENCOUNTER — Ambulatory Visit (INDEPENDENT_AMBULATORY_CARE_PROVIDER_SITE_OTHER): Payer: Medicare HMO | Admitting: Vascular Surgery

## 2018-10-31 ENCOUNTER — Encounter: Payer: Self-pay | Admitting: *Deleted

## 2018-10-31 ENCOUNTER — Ambulatory Visit (HOSPITAL_COMMUNITY)
Admission: RE | Admit: 2018-10-31 | Discharge: 2018-10-31 | Disposition: A | Discharge status: Home / Self Care | Payer: Medicare HMO | Source: Ambulatory Visit | Attending: Vascular Surgery | Admitting: Vascular Surgery

## 2018-10-31 ENCOUNTER — Encounter: Payer: Self-pay | Admitting: Vascular Surgery

## 2018-10-31 ENCOUNTER — Other Ambulatory Visit: Payer: Self-pay

## 2018-10-31 VITALS — BP 132/58 | HR 71 | Temp 97.8°F | Resp 20 | Ht 68.0 in | Wt 178.0 lb

## 2018-10-31 DIAGNOSIS — Z992 Dependence on renal dialysis: Secondary | ICD-10-CM | POA: Diagnosis not present

## 2018-10-31 DIAGNOSIS — N186 End stage renal disease: Secondary | ICD-10-CM

## 2018-10-31 MED ORDER — SODIUM CHLORIDE 0.9% FLUSH: 3.0000 mL | INTRAVENOUS | Status: DC | PRN

## 2018-10-31 NOTE — Telephone Encounter (Signed)
Pt scheduled for procedure on 11/07/2018. Caryl Pina at [facility] notified of this date, as it is pt's usual hemodialysis day.

## 2018-10-31 NOTE — Progress Notes (Signed)
Patient name: Kristen Berry MRN: [id number] DOB: 08/30/1956 Sex: female  REASON FOR VISIT: Evaluate left arm fistula  HPI: Kristen Berry is a 63 y.o. female with multiple medical problems including end-stage renal disease, peripheral vascular disease, acute DVT in 2019 on Coumadin that presents for evaluation of a left arm fistula.  Patient previously had a left brachiocephalic fistula placed by Dr. Donzetta Matters on 11/10/2017.  She was seen by Dr. Donzetta Matters on 06/23/2018 for failure of the fistula to mature and ultimately was recommended that she have a fistulogram and possible conversion to a graft.  Patient never had the fistulogram performed due to transportation issues.  She states she can only come to [place] on Tuesdays due to public transportation from [facility].  She has a right IJ tunnel catheter that she is currently using for dialysis.  Some chronic numbness in the left hand that she states has been present since before fistula was placed.  Past Medical History:  Diagnosis Date  . Anemia   . Arthritis    "hands, knees" (10/10/2017)  . Asthma   . CHF (congestive heart failure) ([place])   . Chronic kidney disease (CKD), stage IV (severe) ([place])    Dialysis T/ Th/ Sat  . Coronary artery disease   . H/O Clostridium difficile infection   . High cholesterol   . History of kidney stones   . Hypertension   . PVD (peripheral vascular disease) ([place])    "LLE; will have OR" (10/10/2017)  . Sleep apnea    "never given mask" (10/10/2017)  . Stroke [facility])    mild stroke mid April - 2019  . Type II diabetes mellitus ([place])    "no RX anymore" (10/10/2017)    Past Surgical History:  Procedure Laterality Date  . AMPUTATION Left 11/10/2017   Procedure: AMPUTATION DIGIT SECOND TOE LEFT FOOT;  Surgeon: Waynetta Sandy, MD;  Location: [place];  Service: Vascular;  Laterality: Left;  . AMPUTATION Left 01/24/2018   Procedure: AMPUTATION THIRD TOE LEFT FOOT;  Surgeon: Waynetta Sandy, MD;   Location: [place];  Service: Vascular;  Laterality: Left;  . AORTOGRAM N/A 11/03/2017   Procedure: Ultrasound Guided Cannulation Right Common Femoral Artery;  Aortagram with Left Lower Extremity Arteriogram; Attempted Treatment Left Superficial Femoral Artery; Percutaneous Closure Right Common Femoral Arteriotomy with Proglide Device;  Surgeon: Waynetta Sandy, MD;  Location: [facility];  Service: Vascular;  Laterality: N/A;  . AV FISTULA PLACEMENT Left 11/10/2017   Procedure: ARTERIOVENOUS (AV) FISTULA CREATION LEFT UPPER ARM;  Surgeon: Waynetta Sandy, MD;  Location: [place];  Service: Vascular;  Laterality: Left;  . HERNIA REPAIR  1950s  . INSERTION OF DIALYSIS CATHETER Right 11/10/2017   Procedure: INSERTION OF TUNNELED DIALYSIS CATHETER RIGHT INTERNAL JUGULAR PLACEMENT;  Surgeon: Waynetta Sandy, MD;  Location: [place];  Service: Vascular;  Laterality: Right;  . IR FLUORO GUIDE CV LINE RIGHT  10/19/2017  . IR US GUIDE VASC ACCESS RIGHT  10/19/2017  . LEFT HEART CATH AND CORONARY ANGIOGRAPHY N/A 11/01/2017   Procedure: LEFT HEART CATH AND CORONARY ANGIOGRAPHY;  Surgeon: Belva Crome, MD;  Location: [place] CV LAB;  Service: Cardiovascular;  Laterality: N/A;  . PERIPHERAL VASCULAR INTERVENTION Left 11/07/2017   Procedure: PERIPHERAL VASCULAR INTERVENTION;  Surgeon: Waynetta Sandy, MD;  Location: [place] CV LAB;  Service: Cardiovascular;  Laterality: Left;  left SFA  . SHOULDER OPEN ROTATOR CUFF REPAIR Left   . TUBAL LIGATION    .  ULTRASOUND GUIDANCE FOR VASCULAR ACCESS  11/01/2017   Procedure: Ultrasound Guidance For Vascular Access;  Surgeon: Belva Crome, MD;  Location: [place] CV LAB;  Service: Cardiovascular;;    Family History  Problem Relation Age of Onset  . Hypertension Father   . Heart failure Maternal Grandmother   . Heart failure Maternal Grandfather     SOCIAL HISTORY: Social History   Tobacco Use  . Smoking status: Never Smoker  .  Smokeless tobacco: Never Used  Substance Use Topics  . Alcohol use: No    Allergies  Allergen Reactions  . Crestor [Rosuvastatin Calcium] Other (See Comments)    Leg pain    Current Outpatient Medications  Medication Sig Dispense Refill  . acetaminophen (TYLENOL) 500 MG tablet Take 1,500 mg by mouth daily as needed for mild pain or headache.     . albuterol (PROAIR HFA) 108 (90 Base) MCG/ACT inhaler Inhale 2 puffs into the lungs every 6 (six) hours as needed for wheezing or shortness of breath.    [name] amLODipine (NORVASC) 5 MG tablet Take 5 mg by mouth every evening.     [name] atorvastatin (LIPITOR) 20 MG tablet Take 1 tablet (20 mg total) by mouth daily at 6 PM. 30 tablet 0  . calcium acetate (PHOSLO) 667 MG capsule     . carvedilol (COREG) 25 MG tablet Take 25 mg by mouth 2 (two) times daily.    . clotrimazole-betamethasone (LOTRISONE) cream Apply 1 application topically daily as needed (rash).   0  . diphenoxylate-atropine (LOMOTIL) 2.5-0.025 MG tablet Take 1 tablet by mouth every 12 (twelve) hours as needed for diarrhea or loose stools.     . hydrALAZINE (APRESOLINE) 25 MG tablet Take 25 mg by mouth 2 (two) times daily.     [name] lidocaine-prilocaine (EMLA) cream APPLY SMALL AMOUNT TO ACCESS SITE (AVF) 1 TO 2 HOURS BEFORE DIALYSIS. COVER WITH OCCLUSIVE DRESSING (SARAN WRAP)    . nitroGLYCERIN (NITROSTAT) 0.4 MG SL tablet Place 0.4 mg under the tongue every 5 (five) minutes as needed for chest pain.    . pantoprazole (PROTONIX) 40 MG tablet Take 40 mg by mouth every evening.     . saccharomyces boulardii (FLORASTOR) 250 MG capsule Take 1 capsule (250 mg total) by mouth 2 (two) times daily. (Patient taking differently: Take 250 mg by mouth every evening. ) 20 capsule 0  . ticagrelor (BRILINTA) 90 MG TABS tablet Take 90 mg by mouth 2 (two) times daily.     [name] warfarin (COUMADIN) 4 MG tablet Take 4 mg by mouth every Monday. In the evening    . warfarin (COUMADIN) 5 MG tablet Take 1 tablet (5 mg  total) by mouth daily. Check INR at [facility] on Saturday (Patient taking differently: Take 5 mg by mouth daily. Take 5 mg once daily in the evening Tuesday through Sunday) 30 tablet 0   No current facility-administered medications for this visit.     REVIEW OF SYSTEMS:  [X]  denotes positive finding, [ ]  denotes negative finding Cardiac  Comments:  Chest pain or chest pressure:    Shortness of breath upon exertion:    Short of breath when lying flat:    Irregular heart rhythm:        Vascular    Pain in calf, thigh, or hip brought on by ambulation:    Pain in feet at night that wakes you up from your sleep:     Blood clot in your veins:  Leg swelling:         Pulmonary    Oxygen at home:    Productive cough:     Wheezing:         Neurologic    Sudden weakness in arms or legs:     Sudden numbness in arms or legs:     Sudden onset of difficulty speaking or slurred speech:    Temporary loss of vision in one eye:     Problems with dizziness:         Gastrointestinal    Blood in stool:     Vomited blood:         Genitourinary    Burning when urinating:     Blood in urine:        Psychiatric    Major depression:         Hematologic    Bleeding problems:    Problems with blood clotting too easily:        Skin    Rashes or ulcers:        Constitutional    Fever or chills:      PHYSICAL EXAM: Vitals:   10/31/18 0923  BP: (!) 132/58  Pulse: 71  Resp: 20  Temp: 97.8 F (36.6 C)  SpO2: 98%  Weight: 178 lb (80.7 kg)  Height: 5\' 8"  (1.727 m)    GENERAL: The patient is a well-nourished female, in no acute distress. The vital signs are documented above. CARDIAC: There is a regular rate and rhythm.  VASCULAR:  Thrill in left brachiocephalic fistula below elbow, no thrill above elbow 2+ palpable left radial and brachial pulse No tissue loss in left hand RIJ tunneled catheter PULMONARY: There is good air exchange bilaterally without wheezing or  rales. ABDOMEN: Soft and non-tender with normal pitched bowel sounds.  MUSCULOSKELETAL: There are no major deformities or cyanosis. NEUROLOGIC: No focal weakness or paresthesias are detected.   DATA:   Independently reviewed her left upper extremity brachiocephalic fistula duplex that shows a patent fistula although very sluggish velocities.  Assessment/Plan:  Failure of left arm brachiocephalic fistula to mature initially placed on 10/31/17.  Agree with previous recommendation by Dr. Donzetta Matters that patient needs a left upper extremity fistulogram.  She has a appreciable thrill on exam at the anastomosis of her fistula but no thrill in the upper arm.  Pending the findings of the fistulogram if this is not amendable to percutaneous intervention she may need conversion to a graft in the left arm.  She currently has a right IJ catheter that she is using for dialysis.  We will arrange on next available Tuesday given patient uses public transportation from [facility] and this is the only day she can come to [place].  Will instruct her to hold her Coumadin for 3 days preop.   Marty Heck, MD [facility] Office: [phone] Pager: [phone]

## 2018-11-07 ENCOUNTER — Other Ambulatory Visit: Payer: Self-pay

## 2018-11-07 ENCOUNTER — Ambulatory Visit (HOSPITAL_COMMUNITY)
Admission: RE | Admit: 2018-11-07 | Discharge: 2018-11-07 | Disposition: A | Discharge status: Home / Self Care | Payer: Medicare HMO | Attending: Surgery | Admitting: Surgery

## 2018-11-07 ENCOUNTER — Inpatient Hospital Stay (HOSPITAL_COMMUNITY)
Admission: RE | Disposition: A | Discharge status: Home / Self Care | Payer: Self-pay | Source: Home / Self Care | Attending: Surgery

## 2018-11-07 DIAGNOSIS — T829XXA Unspecified complication of cardiac and vascular prosthetic device, implant and graft, initial encounter: Secondary | ICD-10-CM | POA: Diagnosis present

## 2018-11-07 DIAGNOSIS — Z539 Procedure and treatment not carried out, unspecified reason: Secondary | ICD-10-CM | POA: Diagnosis not present

## 2018-11-07 DIAGNOSIS — X58XXXA Exposure to other specified factors, initial encounter: Secondary | ICD-10-CM | POA: Diagnosis not present

## 2018-11-07 LAB — POCT I-STAT 4, (NA,K, GLUC, HGB,HCT)
Glucose, Bld: 148 mg/dL — ABNORMAL HIGH (ref 70–99)
HCT: 38 % (ref 36.0–46.0)
Hemoglobin: 12.9 g/dL (ref 12.0–15.0)
Potassium: 3.7 mmol/L (ref 3.5–5.1)
Sodium: 137 mmol/L (ref 135–145)

## 2018-11-07 LAB — POCT I-STAT CREATININE: Creatinine, Ser: 4.8 mg/dL — ABNORMAL HIGH (ref 0.44–1.00)

## 2018-11-07 LAB — PROTIME-INR
INR: 1.42
Prothrombin Time: 17.2 seconds — ABNORMAL HIGH (ref 11.4–15.2)

## 2018-11-07 SURGERY — A/V FISTULAGRAM
Anesthesia: LOCAL | Laterality: Left

## 2018-11-22 ENCOUNTER — Encounter: Payer: Self-pay | Admitting: Nephrology

## 2018-12-14 DIAGNOSIS — S91101A Unspecified open wound of right great toe without damage to nail, initial encounter: Secondary | ICD-10-CM | POA: Insufficient documentation

## 2018-12-21 ENCOUNTER — Inpatient Hospital Stay (HOSPITAL_COMMUNITY)
Admission: EM | Admit: 2018-12-21 | Discharge: 2018-12-31 | DRG: 239 | Disposition: A | Discharge status: Skilled Nursing Facility | Payer: Medicare HMO | Attending: Family Medicine | Admitting: Family Medicine

## 2018-12-21 ENCOUNTER — Other Ambulatory Visit: Payer: Self-pay

## 2018-12-21 ENCOUNTER — Encounter (HOSPITAL_COMMUNITY): Payer: Self-pay

## 2018-12-21 ENCOUNTER — Emergency Department (HOSPITAL_COMMUNITY): Payer: Medicare HMO

## 2018-12-21 DIAGNOSIS — M869 Osteomyelitis, unspecified: Secondary | ICD-10-CM

## 2018-12-21 DIAGNOSIS — I132 Hypertensive heart and chronic kidney disease with heart failure and with stage 5 chronic kidney disease, or end stage renal disease: Secondary | ICD-10-CM | POA: Diagnosis present

## 2018-12-21 DIAGNOSIS — I739 Peripheral vascular disease, unspecified: Secondary | ICD-10-CM | POA: Diagnosis not present

## 2018-12-21 DIAGNOSIS — N186 End stage renal disease: Secondary | ICD-10-CM | POA: Diagnosis present

## 2018-12-21 DIAGNOSIS — E1152 Type 2 diabetes mellitus with diabetic peripheral angiopathy with gangrene: Secondary | ICD-10-CM | POA: Diagnosis present

## 2018-12-21 DIAGNOSIS — D472 Monoclonal gammopathy: Secondary | ICD-10-CM | POA: Diagnosis present

## 2018-12-21 DIAGNOSIS — M86171 Other acute osteomyelitis, right ankle and foot: Secondary | ICD-10-CM

## 2018-12-21 DIAGNOSIS — L02611 Cutaneous abscess of right foot: Secondary | ICD-10-CM | POA: Diagnosis not present

## 2018-12-21 DIAGNOSIS — G4730 Sleep apnea, unspecified: Secondary | ICD-10-CM | POA: Diagnosis present

## 2018-12-21 DIAGNOSIS — T82898A Other specified complication of vascular prosthetic devices, implants and grafts, initial encounter: Secondary | ICD-10-CM | POA: Diagnosis not present

## 2018-12-21 DIAGNOSIS — N2581 Secondary hyperparathyroidism of renal origin: Secondary | ICD-10-CM | POA: Diagnosis present

## 2018-12-21 DIAGNOSIS — I5022 Chronic systolic (congestive) heart failure: Secondary | ICD-10-CM | POA: Diagnosis present

## 2018-12-21 DIAGNOSIS — Z992 Dependence on renal dialysis: Secondary | ICD-10-CM

## 2018-12-21 DIAGNOSIS — Z89429 Acquired absence of other toe(s), unspecified side: Secondary | ICD-10-CM

## 2018-12-21 DIAGNOSIS — I70201 Unspecified atherosclerosis of native arteries of extremities, right leg: Secondary | ICD-10-CM | POA: Diagnosis present

## 2018-12-21 DIAGNOSIS — M19042 Primary osteoarthritis, left hand: Secondary | ICD-10-CM | POA: Diagnosis present

## 2018-12-21 DIAGNOSIS — I2510 Atherosclerotic heart disease of native coronary artery without angina pectoris: Secondary | ICD-10-CM | POA: Diagnosis present

## 2018-12-21 DIAGNOSIS — I96 Gangrene, not elsewhere classified: Secondary | ICD-10-CM | POA: Diagnosis present

## 2018-12-21 DIAGNOSIS — Z87442 Personal history of urinary calculi: Secondary | ICD-10-CM

## 2018-12-21 DIAGNOSIS — T82856A Stenosis of peripheral vascular stent, initial encounter: Secondary | ICD-10-CM | POA: Diagnosis present

## 2018-12-21 DIAGNOSIS — Z1159 Encounter for screening for other viral diseases: Secondary | ICD-10-CM

## 2018-12-21 DIAGNOSIS — L03115 Cellulitis of right lower limb: Secondary | ICD-10-CM | POA: Diagnosis present

## 2018-12-21 DIAGNOSIS — E1122 Type 2 diabetes mellitus with diabetic chronic kidney disease: Secondary | ICD-10-CM | POA: Diagnosis present

## 2018-12-21 DIAGNOSIS — I440 Atrioventricular block, first degree: Secondary | ICD-10-CM | POA: Diagnosis present

## 2018-12-21 DIAGNOSIS — E1159 Type 2 diabetes mellitus with other circulatory complications: Secondary | ICD-10-CM | POA: Diagnosis not present

## 2018-12-21 DIAGNOSIS — Z8614 Personal history of Methicillin resistant Staphylococcus aureus infection: Secondary | ICD-10-CM

## 2018-12-21 DIAGNOSIS — E8889 Other specified metabolic disorders: Secondary | ICD-10-CM | POA: Diagnosis present

## 2018-12-21 DIAGNOSIS — E785 Hyperlipidemia, unspecified: Secondary | ICD-10-CM | POA: Diagnosis present

## 2018-12-21 DIAGNOSIS — M79671 Pain in right foot: Secondary | ICD-10-CM | POA: Diagnosis present

## 2018-12-21 DIAGNOSIS — I70235 Atherosclerosis of native arteries of right leg with ulceration of other part of foot: Secondary | ICD-10-CM | POA: Diagnosis not present

## 2018-12-21 DIAGNOSIS — E11621 Type 2 diabetes mellitus with foot ulcer: Secondary | ICD-10-CM | POA: Diagnosis present

## 2018-12-21 DIAGNOSIS — E1169 Type 2 diabetes mellitus with other specified complication: Secondary | ICD-10-CM | POA: Diagnosis present

## 2018-12-21 DIAGNOSIS — Z79899 Other long term (current) drug therapy: Secondary | ICD-10-CM

## 2018-12-21 DIAGNOSIS — Z794 Long term (current) use of insulin: Secondary | ICD-10-CM | POA: Diagnosis not present

## 2018-12-21 DIAGNOSIS — L089 Local infection of the skin and subcutaneous tissue, unspecified: Secondary | ICD-10-CM | POA: Diagnosis present

## 2018-12-21 DIAGNOSIS — Z8249 Family history of ischemic heart disease and other diseases of the circulatory system: Secondary | ICD-10-CM

## 2018-12-21 DIAGNOSIS — Z66 Do not resuscitate: Secondary | ICD-10-CM | POA: Diagnosis present

## 2018-12-21 DIAGNOSIS — J45909 Unspecified asthma, uncomplicated: Secondary | ICD-10-CM | POA: Diagnosis present

## 2018-12-21 DIAGNOSIS — Y838 Other surgical procedures as the cause of abnormal reaction of the patient, or of later complication, without mention of misadventure at the time of the procedure: Secondary | ICD-10-CM | POA: Diagnosis present

## 2018-12-21 DIAGNOSIS — Z8673 Personal history of transient ischemic attack (TIA), and cerebral infarction without residual deficits: Secondary | ICD-10-CM

## 2018-12-21 DIAGNOSIS — E1151 Type 2 diabetes mellitus with diabetic peripheral angiopathy without gangrene: Secondary | ICD-10-CM | POA: Diagnosis not present

## 2018-12-21 DIAGNOSIS — D631 Anemia in chronic kidney disease: Secondary | ICD-10-CM | POA: Diagnosis present

## 2018-12-21 DIAGNOSIS — L03031 Cellulitis of right toe: Secondary | ICD-10-CM | POA: Diagnosis not present

## 2018-12-21 DIAGNOSIS — Z7901 Long term (current) use of anticoagulants: Secondary | ICD-10-CM

## 2018-12-21 DIAGNOSIS — M19041 Primary osteoarthritis, right hand: Secondary | ICD-10-CM | POA: Diagnosis present

## 2018-12-21 DIAGNOSIS — L97519 Non-pressure chronic ulcer of other part of right foot with unspecified severity: Secondary | ICD-10-CM | POA: Diagnosis present

## 2018-12-21 DIAGNOSIS — Z888 Allergy status to other drugs, medicaments and biological substances status: Secondary | ICD-10-CM

## 2018-12-21 DIAGNOSIS — M170 Bilateral primary osteoarthritis of knee: Secondary | ICD-10-CM | POA: Diagnosis present

## 2018-12-21 DIAGNOSIS — R9431 Abnormal electrocardiogram [ECG] [EKG]: Secondary | ICD-10-CM | POA: Diagnosis not present

## 2018-12-21 DIAGNOSIS — I2583 Coronary atherosclerosis due to lipid rich plaque: Secondary | ICD-10-CM | POA: Diagnosis present

## 2018-12-21 DIAGNOSIS — Z86718 Personal history of other venous thrombosis and embolism: Secondary | ICD-10-CM

## 2018-12-21 DIAGNOSIS — Z7902 Long term (current) use of antithrombotics/antiplatelets: Secondary | ICD-10-CM

## 2018-12-21 LAB — SEDIMENTATION RATE: Sed Rate: 100 mm/hr — ABNORMAL HIGH (ref 0–22)

## 2018-12-21 LAB — COMPREHENSIVE METABOLIC PANEL
ALT: 10 U/L (ref 0–44)
AST: 16 U/L (ref 15–41)
Albumin: 3.4 g/dL — ABNORMAL LOW (ref 3.5–5.0)
Alkaline Phosphatase: 108 U/L (ref 38–126)
Anion gap: 12 (ref 5–15)
BUN: 7 mg/dL — ABNORMAL LOW (ref 8–23)
CO2: 25 mmol/L (ref 22–32)
Calcium: 8.8 mg/dL — ABNORMAL LOW (ref 8.9–10.3)
Chloride: 98 mmol/L (ref 98–111)
Creatinine, Ser: 1.83 mg/dL — ABNORMAL HIGH (ref 0.44–1.00)
GFR calc Af Amer: 33 mL/min — ABNORMAL LOW (ref >60)
GFR calc non Af Amer: 29 mL/min — ABNORMAL LOW (ref >60)
Glucose, Bld: 90 mg/dL (ref 70–99)
Potassium: 3.7 mmol/L (ref 3.5–5.1)
Sodium: 135 mmol/L (ref 135–145)
Total Bilirubin: 0.5 mg/dL (ref 0.3–1.2)
Total Protein: 8.1 g/dL (ref 6.5–8.1)

## 2018-12-21 LAB — GLUCOSE, CAPILLARY: Glucose-Capillary: 98 mg/dL (ref 70–99)

## 2018-12-21 LAB — PROTIME-INR
INR: 1.6 — ABNORMAL HIGH (ref 0.8–1.2)
Prothrombin Time: 18.4 seconds — ABNORMAL HIGH (ref 11.4–15.2)

## 2018-12-21 LAB — CBC WITH DIFFERENTIAL/PLATELET
Abs Immature Granulocytes: 0.1 10*3/uL — ABNORMAL HIGH (ref 0.00–0.07)
Basophils Absolute: 0.1 10*3/uL (ref 0.0–0.1)
Basophils Relative: 1 %
Eosinophils Absolute: 0.3 10*3/uL (ref 0.0–0.5)
Eosinophils Relative: 2 %
HCT: 38.6 % (ref 36.0–46.0)
Hemoglobin: 12.4 g/dL (ref 12.0–15.0)
Immature Granulocytes: 1 %
Lymphocytes Relative: 17 %
Lymphs Abs: 2 10*3/uL (ref 0.7–4.0)
MCH: 32.6 pg (ref 26.0–34.0)
MCHC: 32.1 g/dL (ref 30.0–36.0)
MCV: 101.6 fL — ABNORMAL HIGH (ref 80.0–100.0)
Monocytes Absolute: 1 10*3/uL (ref 0.1–1.0)
Monocytes Relative: 8 %
Neutro Abs: 8.6 10*3/uL — ABNORMAL HIGH (ref 1.7–7.7)
Neutrophils Relative %: 71 %
Platelets: 602 10*3/uL — ABNORMAL HIGH (ref 150–400)
RBC: 3.8 MIL/uL — ABNORMAL LOW (ref 3.87–5.11)
RDW: 14.5 % (ref 11.5–15.5)
WBC: 12 10*3/uL — ABNORMAL HIGH (ref 4.0–10.5)
nRBC: 0 % (ref 0.0–0.2)

## 2018-12-21 MED ORDER — ACETAMINOPHEN 650 MG RE SUPP: 650.0000 mg | Freq: Four times a day (QID) | RECTAL | Status: DC | PRN

## 2018-12-21 MED ORDER — SODIUM CHLORIDE 0.9 % IV SOLN: 250.0000 mL | INTRAVENOUS | Status: DC | PRN

## 2018-12-21 MED ORDER — AMLODIPINE BESYLATE 5 MG PO TABS
5.0000 mg | ORAL_TABLET | Freq: Every evening | ORAL | Status: DC
  Administered 2018-12-21 – 2018-12-30 (×10): 5 mg via ORAL
  Filled 2018-12-21 (×10): qty 1

## 2018-12-21 MED ORDER — HYDRALAZINE HCL 25 MG PO TABS
25.0000 mg | ORAL_TABLET | Freq: Two times a day (BID) | ORAL | Status: DC
  Administered 2018-12-22 – 2018-12-30 (×15): 25 mg via ORAL
  Filled 2018-12-21 (×17): qty 1

## 2018-12-21 MED ORDER — SODIUM CHLORIDE 0.9 % IV SOLN
2.0000 g | INTRAVENOUS | Status: AC
  Administered 2018-12-23: 2 g via INTRAVENOUS
  Filled 2018-12-21: qty 2

## 2018-12-21 MED ORDER — CALCIUM ACETATE (PHOS BINDER) 667 MG PO CAPS
667.0000 mg | ORAL_CAPSULE | Freq: Two times a day (BID) | ORAL | Status: DC
  Administered 2018-12-22 – 2018-12-31 (×13): 667 mg via ORAL
  Filled 2018-12-21 (×13): qty 1

## 2018-12-21 MED ORDER — CARVEDILOL 25 MG PO TABS
25.0000 mg | ORAL_TABLET | Freq: Two times a day (BID) | ORAL | Status: DC
  Administered 2018-12-21 – 2018-12-30 (×19): 25 mg via ORAL
  Filled 2018-12-21 (×21): qty 1

## 2018-12-21 MED ORDER — SODIUM CHLORIDE 0.9% FLUSH
3.0000 mL | Freq: Two times a day (BID) | INTRAVENOUS | Status: DC
  Administered 2018-12-21 – 2018-12-30 (×13): 3 mL via INTRAVENOUS

## 2018-12-21 MED ORDER — INSULIN ASPART 100 UNIT/ML ~~LOC~~ SOLN
0.0000 [IU] | Freq: Three times a day (TID) | SUBCUTANEOUS | Status: DC
  Administered 2018-12-22 – 2018-12-25 (×5): 1 [IU] via SUBCUTANEOUS
  Administered 2018-12-26 (×2): 2 [IU] via SUBCUTANEOUS
  Administered 2018-12-29: 1 [IU] via SUBCUTANEOUS

## 2018-12-21 MED ORDER — ACETAMINOPHEN 325 MG PO TABS: 650.0000 mg | ORAL_TABLET | Freq: Four times a day (QID) | ORAL | Status: DC | PRN

## 2018-12-21 MED ORDER — SODIUM CHLORIDE 0.9% FLUSH: 3.0000 mL | INTRAVENOUS | Status: DC | PRN

## 2018-12-21 MED ORDER — TICAGRELOR 90 MG PO TABS
90.0000 mg | ORAL_TABLET | Freq: Two times a day (BID) | ORAL | Status: DC
  Filled 2018-12-21: qty 1

## 2018-12-21 MED ORDER — POLYETHYLENE GLYCOL 3350 17 G PO PACK: 17.0000 g | PACK | Freq: Every day | ORAL | Status: DC | PRN

## 2018-12-21 MED ORDER — HEPARIN SODIUM (PORCINE) 5000 UNIT/ML IJ SOLN
5000.0000 [IU] | Freq: Once | INTRAMUSCULAR | Status: AC
  Administered 2018-12-21: 5000 [IU] via SUBCUTANEOUS
  Filled 2018-12-21: qty 1

## 2018-12-21 MED ORDER — ATORVASTATIN CALCIUM 10 MG PO TABS
20.0000 mg | ORAL_TABLET | Freq: Every day | ORAL | Status: DC
  Administered 2018-12-21 – 2018-12-30 (×10): 20 mg via ORAL
  Filled 2018-12-21 (×10): qty 2

## 2018-12-21 MED ORDER — VANCOMYCIN HCL 1000 MG IV SOLR
1000.0000 mg | INTRAVENOUS | Status: DC
  Administered 2018-12-26 – 2018-12-28 (×2): 1000 mg via INTRAVENOUS
  Filled 2018-12-21 (×4): qty 1000

## 2018-12-21 NOTE — Progress Notes (Signed)
New Admission Note:   Arrival Method: Arrived from ED via stretcher Mental Orientation: Alert and oriented x4 Telemetry: N/A Assessment: Completed Skin: See doc flowsheet IV: Rt AC Pain: Denies Tubes: N/A Safety Measures: Safety Fall Prevention Plan has been discussed.  Admission: Completed 5MW Orientation: Patient has been orientated to the room, unit and staff.  Family: None at bedside  Orders have been reviewed and implemented. Will continue to monitor the patient. Call light has been placed within reach and bed alarm has been activated.   Damarion Mendizabal [employer], RN-BC Phone number: [phone]

## 2018-12-21 NOTE — Progress Notes (Signed)
Clarified with on call MD re: Chauncey Fischer MD note to hold medicine but on MAR,it's scheduled to be given.Order received to hold medicine. Dondra Rhett, Wonda Cheng, Therapist, sports

## 2018-12-21 NOTE — Progress Notes (Signed)
Received report from ED RN. Room ready for patient. Evita Merida Joselita, RN 

## 2018-12-21 NOTE — Progress Notes (Signed)
Pharmacy Antibiotic Note  Kristen Berry is a 63 y.o. female admitted on 12/21/2018 with Osteo.  Pharmacy has been consulted for Vanco/Fortaz dosing.  CC/HPI: Necrotic R big toe with redness and drainage. HD today 3/26.  PMH: Anemia, arthritis, asthma, CHF, ESRD TTS, CAD, h/o cdiff, HLD, HTN, PVD, OSA, CVA 4/19, DM, h/o osteo of L toes s/p amputation. H/o DVT, MGUS  ID: Osteo on xray of R big toe. And R leg cellulits. Afebrile. WBC 12. ESRD. Received Vanco and Ceftaz with last 2 HD sessions per admit note.  Vanco 3/19>> Kristen Berry 3/19>>  Plan: Possible surgery in AM Resume warfarin after OR HD TTS Vanco 1g IV q HD TTS Fortaz 2g IV q HD TTS    Height: 5\' 8"  (172.7 cm) Weight: 178 lb 1.6 oz (80.8 kg) IBW/kg (Calculated) : 63.9  Temp (24hrs), Avg:98.1 F (36.7 C), Min:98 F (36.7 C), Max:98.3 F (36.8 C)  Recent Labs  Lab 12/21/18 1732  WBC 12.0*  CREATININE 1.83*    Estimated Creatinine Clearance: 35.1 mL/min (A) (by C-G formula based on SCr of 1.83 mg/dL (H)).    Allergies  Allergen Reactions  . Crestor [Rosuvastatin Calcium] Other (See Comments)    Leg pain     Kristen Berry S. [place], PharmD, BCPS Clinical Staff Pharmacist Eilene Ghazi [facility] 12/21/2018 10:17 PM

## 2018-12-21 NOTE — ED Notes (Signed)
ED TO INPATIENT HANDOFF REPORT  ED Nurse Name and Phone #:  Humberto Seals, [place]  S Name/Age/Gender Kristen Berry 63 y.o. female Room/Bed: 021C/021C  Code Status   Code Status: Prior  Home/SNF/Other Home {Patient oriented x 4Is this baseline? yes  Triage Complete: Triage complete  Chief Complaint Necrotic R Big Toe  Triage Note Pt presents with necrotic R big toe, reports 1 week h/o of redness, drainage with foul odor. Received full treatment of HD today, with wound and blood cultures taken.   Allergies Allergies  Allergen Reactions  . Crestor [Rosuvastatin Calcium] Other (See Comments)    Leg pain    Level of Care/Admitting Diagnosis ED Disposition    ED Disposition Condition [facility] Area: [place] [[id number]]  Level of Care: Med-Surg [16]  Diagnosis: Osteomyelitis of toe of right foot [facility]) [[id number]]  Admitting Physician: Benay Pike [[id number]]  Attending Physician: Leeanne Rio [phone]  Estimated length of stay: past midnight tomorrow  Certification:: I certify this patient will need inpatient services for at least 2 midnights  PT Class (Do Not Modify): Inpatient [101]  PT Acc Code (Do Not Modify): Private [1]       B Medical/Surgery History Past Medical History:  Diagnosis Date  . Anemia   . Arthritis    "hands, knees" (10/10/2017)  . Asthma   . CHF (congestive heart failure) ([place])   . Chronic kidney disease (CKD), stage IV (severe) ([place])    Dialysis T/ Th/ Sat  . Coronary artery disease   . H/O Clostridium difficile infection   . High cholesterol   . History of kidney stones   . Hypertension   . PVD (peripheral vascular disease) ([place])    "LLE; will have OR" (10/10/2017)  . Sleep apnea    "never given mask" (10/10/2017)  . Stroke [facility])    mild stroke mid April - 2019  . Type II diabetes mellitus ([place])    "no RX anymore" (10/10/2017)   Past Surgical History:  Procedure Laterality Date  . AMPUTATION Left  11/10/2017   Procedure: AMPUTATION DIGIT SECOND TOE LEFT FOOT;  Surgeon: Waynetta Sandy, MD;  Location: [place];  Service: Vascular;  Laterality: Left;  . AMPUTATION Left 01/24/2018   Procedure: AMPUTATION THIRD TOE LEFT FOOT;  Surgeon: Waynetta Sandy, MD;  Location: [place];  Service: Vascular;  Laterality: Left;  . AORTOGRAM N/A 11/03/2017   Procedure: Ultrasound Guided Cannulation Right Common Femoral Artery;  Aortagram with Left Lower Extremity Arteriogram; Attempted Treatment Left Superficial Femoral Artery; Percutaneous Closure Right Common Femoral Arteriotomy with Proglide Device;  Surgeon: Waynetta Sandy, MD;  Location: [facility] OR;  Service: Vascular;  Laterality: N/A;  . AV FISTULA PLACEMENT Left 11/10/2017   Procedure: ARTERIOVENOUS (AV) FISTULA CREATION LEFT UPPER ARM;  Surgeon: Waynetta Sandy, MD;  Location: [place];  Service: Vascular;  Laterality: Left;  . HERNIA REPAIR  1950s  . INSERTION OF DIALYSIS CATHETER Right 11/10/2017   Procedure: INSERTION OF TUNNELED DIALYSIS CATHETER RIGHT INTERNAL JUGULAR PLACEMENT;  Surgeon: Waynetta Sandy, MD;  Location: [place];  Service: Vascular;  Laterality: Right;  . IR FLUORO GUIDE CV LINE RIGHT  10/19/2017  . IR US GUIDE VASC ACCESS RIGHT  10/19/2017  . LEFT HEART CATH AND CORONARY ANGIOGRAPHY N/A 11/01/2017   Procedure: LEFT HEART CATH AND CORONARY ANGIOGRAPHY;  Surgeon: Belva Crome, MD;  Location: [place] CV LAB;  Service: Cardiovascular;  Laterality: N/A;  .  PERIPHERAL VASCULAR INTERVENTION Left 11/07/2017   Procedure: PERIPHERAL VASCULAR INTERVENTION;  Surgeon: Waynetta Sandy, MD;  Location: [place] CV LAB;  Service: Cardiovascular;  Laterality: Left;  left SFA  . SHOULDER OPEN ROTATOR CUFF REPAIR Left   . TUBAL LIGATION    . ULTRASOUND GUIDANCE FOR VASCULAR ACCESS  11/01/2017   Procedure: Ultrasound Guidance For Vascular Access;  Surgeon: Belva Crome, MD;  Location: [place] CV LAB;   Service: Cardiovascular;;     A IV Location/Drains/Wounds Patient Lines/Drains/Airways Status   Active Line/Drains/Airways    Name:   Placement date:   Placement time:   Site:   Days:   Peripheral IV 12/21/18 Right Antecubital   12/21/18    1709    Antecubital   less than 1   Fistula / Graft Left Upper arm Arteriovenous fistula   -    -    Upper arm      Hemodialysis Catheter Right Internal jugular Double-lumen;Permanent   11/10/17    1102    Internal jugular   406   Incision (Closed) 11/03/17 Groin Right   11/03/17    1905     413   Incision (Closed) 11/03/17 Leg Left   11/03/17    1905     413   Incision (Closed) 11/10/17 Chest Right   11/10/17    1123     406   Incision (Closed) 11/10/17 Arm Left   11/10/17    1123     406   Incision (Closed) 11/10/17 Foot Left   11/10/17    1139     406   Incision (Closed) 01/24/18 Foot Left   01/24/18    0841     331   Pressure Injury 11/01/17 Stage II -  Partial thickness loss of dermis presenting as a shallow open ulcer with a red, pink wound bed without slough.   11/01/17    0600     415          Intake/Output Last 24 hours No intake or output data in the 24 hours ending 12/21/18 2047  Labs/Imaging Results for orders placed or performed during the hospital encounter of 12/21/18 (from the past 48 hour(s))  CBC with Differential     Status: Abnormal   Collection Time: 12/21/18  5:32 PM  Result Value Ref Range   WBC 12.0 (H) 4.0 - 10.5 K/uL   RBC 3.80 (L) 3.87 - 5.11 MIL/uL   Hemoglobin 12.4 12.0 - 15.0 g/dL   HCT 38.6 36.0 - 46.0 %   MCV 101.6 (H) 80.0 - 100.0 fL   MCH 32.6 26.0 - 34.0 pg   MCHC 32.1 30.0 - 36.0 g/dL   RDW 14.5 11.5 - 15.5 %   Platelets 602 (H) 150 - 400 K/uL   nRBC 0.0 0.0 - 0.2 %   Neutrophils Relative % 71 %   Neutro Abs 8.6 (H) 1.7 - 7.7 K/uL   Lymphocytes Relative 17 %   Lymphs Abs 2.0 0.7 - 4.0 K/uL   Monocytes Relative 8 %   Monocytes Absolute 1.0 0.1 - 1.0 K/uL   Eosinophils Relative 2 %   Eosinophils  Absolute 0.3 0.0 - 0.5 K/uL   Basophils Relative 1 %   Basophils Absolute 0.1 0.0 - 0.1 K/uL   Immature Granulocytes 1 %   Abs Immature Granulocytes 0.10 (H) 0.00 - 0.07 K/uL    Comment: Performed at [facility], [address], [place], [place] [address]  Comprehensive metabolic panel     Status: Abnormal   Collection Time: 12/21/18  5:32 PM  Result Value Ref Range   Sodium 135 135 - 145 mmol/L   Potassium 3.7 3.5 - 5.1 mmol/L   Chloride 98 98 - 111 mmol/L   CO2 25 22 - 32 mmol/L   Glucose, Bld 90 70 - 99 mg/dL   BUN 7 (L) 8 - 23 mg/dL   Creatinine, Ser 1.83 (H) 0.44 - 1.00 mg/dL   Calcium 8.8 (L) 8.9 - 10.3 mg/dL   Total Protein 8.1 6.5 - 8.1 g/dL   Albumin 3.4 (L) 3.5 - 5.0 g/dL   AST 16 15 - 41 U/L   ALT 10 0 - 44 U/L   Alkaline Phosphatase 108 38 - 126 U/L   Total Bilirubin 0.5 0.3 - 1.2 mg/dL   GFR calc non Af Amer 29 (L) >60 mL/min   GFR calc Af Amer 33 (L) >60 mL/min   Anion gap 12 5 - 15    Comment: Performed at [facility], [address], [place], [place] [address]  Sedimentation rate     Status: Abnormal   Collection Time: 12/21/18  5:32 PM  Result Value Ref Range   Sed Rate 100 (H) 0 - 22 mm/hr    Comment: Performed at [facility] [address] [place], [place] [address]  Protime-INR     Status: Abnormal   Collection Time: 12/21/18  5:32 PM  Result Value Ref Range   Prothrombin Time 18.4 (H) 11.4 - 15.2 seconds   INR 1.6 (H) 0.8 - 1.2    Comment: (NOTE) INR goal varies based on device and disease states. Performed at [facility], [place] [address] [place], [place] [address]    Dg Foot Complete Right  Result Date: 12/21/2018 CLINICAL DATA:  Pt presents with necrotic R big toe, reports 1 week h/o of redness, drainage with foul odor. Pt states the pain radiates from her big toe up into her lower leg, right foot and lower leg has associated swelling and redness. Pt denies any injury to right foot. Hx of diabetes and PVD. EXAM: RIGHT  FOOT COMPLETE - 3+ VIEW COMPARISON:  None. FINDINGS: There is resorption of a portion of the distal tuft, along its plantar lateral margin, and along the lateral aspect of the proximal to mid aspect of the great toe distal phalanx, findings consistent with osteomyelitis. There are no other areas of bone resorption to suggest additional osteomyelitis. No fracture.  Joints are normally aligned. There is soft tissue swelling of the great toe without soft tissue air. Large dorsal and plantar calcaneal spurs. IMPRESSION: 1. Osteomyelitis of the distal phalanx of the right great toe involving the distal tuft and lateral aspect of the proximal to mid phalanx. 2. There is surrounding soft tissue swelling, without soft tissue air. 3. No fracture, dislocation or other evidence of osteomyelitis. Electronically Signed   By: Lajean Manes M.D.   On: 12/21/2018 18:01    Pending Labs Unresulted Labs (From admission, onward)   None      Vitals/Pain Today's Vitals   12/21/18 1705 12/21/18 1814 12/21/18 1928 12/21/18 2039  BP:  (!) 134/59 (!) 140/57 (!) 140/58  Pulse:  (!) 108 (!) 105 (!) 103  Resp:  18 18 18   Temp:  98.1 F (36.7 C)    TempSrc:  Oral    SpO2:  100% 100% 100%  Weight: 85 kg  Height: 5\' 7"  (1.702 m)     PainSc: 7  7  7  7      Isolation Precautions No active isolations  Medications Medications - No data to display  Mobility walks with device Low fall risk   Focused Assessments Renal Assessment Handoff:  Hemodialysis Schedule: Hemodialysis Schedule: Tuesday/Thursday/Saturday Last Hemodialysis date and time: 12/21/18   Restricted appendage: Left upper arm    R Recommendations: See Admitting Provider Note  Report given to:   Additional Notes:

## 2018-12-21 NOTE — H&P (Addendum)
[facility] Admission History and Physical Service Pager: [phone]  Patient name: Kristen Berry Medical record number: [id number] Date of birth: 1956/02/06 Age: 63 y.o. Gender: female  Primary Care Provider: [place], Sarah T, MD Consultants: ortho, nephro Code Status: DNR  Chief Complaint: foot ulcer  Assessment and Plan: Kristen Berry is a 63 y.o. female presenting with osteomyelitis of the right great toe.[name] PMH is significant for osteomyelitis of the left toes s/p amputation, ESRD, diabetes type 2, hypertension, history of DVT, history of stroke, CHF, MGUS  Osteomyelitis of right first toe- patient complaining of 2 weeks of right foot pain with ulceration that she did not notice until she is developed pain.  Described as constant pain with intermittent burning pain, also endorsing intermittent numbness of that foot.  On exam she has necrosis and ulceration isolated to the right toe.  See pictures below.  Has received antibiotics with her last 2 dialysis sessions, vancomycin and ceftaz.  Was told at dialysis that she should come to the ED to have her foot evaluated.  Previous history of amputation of left second and third toe by [facility].  Patient is afebrile, WBC 12.0, vital signs stable.  X-ray of right foot shows osteomyelitis of distal phalanx of right great toe.  No fracture or dislocation.  Received antibiotics today with hemodialysis before coming to the ED.  We will give next antibiotics with hemodialysis on Saturday.  No cultures were obtained, due to lack of fever, minimal white cell count.  Will get wound cultures with amputation.  Ortho was consulted, want patient n.p.o. for possible surgery in the morning. - admit to inpatient, Dr. Ardelia Mems attending.  - ortho consulted, appreciate recs.   - NPO @ [facility] for possible surgery in AM.   - holding warfarin and Brilinta until after surgery - Received ABx today with HD, Ceftaz and Vanc - daily weights/ I&O - vitals  per routine - AM CBC, BMP, INR, PTT  - AM EKG - PT/OT to assess  Right leg cellulitis -patient has mild appearing cellulitis of right leg from foot to upper two thirds of her shin.  No well-defined border, endorses tenderness to palpation, not significantly warmer to touch than left leg.  Marked border around leg, will receive antibiotics with hemodialysis. Area outlined in ED - Vanco and ceftaz with hemodialysis - Monitor regression of rash.  ESRD on HD TThS -patient received hemodialysis earlier today.  Creatinine is 1.83, GFR is 29.  Calcium 8.8, albumin 3.4.  Other electrolytes normal. - nephro consulted, appreciate recs - renally dose medications -Continue PhosLo  Diabetes mellitus type 2- 5.5 on 10/13/2017.  Was previously 6.6 in 2014.  No home medications diet controlled. - f/u repeat A1c.   - sSSI, CBGs q4 hours  MGUS-diagnosed in 2017.  Patient not currently on any medication.  Seen by Dr. Alvy Bimler at [facility].  HFrEF- January 2018 patient had echo that showed LVEF 35 to 40%.  Septal and apical akinesis, moderately dilated LV, mild LVH.  Mild mitral valve stenosis.  Pulmonary artery peak pressure 55 mmHg no crackles on exam.  Patient not currently in respiratory distress.  No signs of acute worsening.  CAD- left heart cath in February 2019 showed severe diffuse diabetic coronary disease, 70 to 80% stenosis of proximal LAD 75% obstruction of RCA -holding brilinta in setting of needing surgery  Mild asthma - since childhood. Hasn'Berry used her albuterol in months.  No other medications. -Hold albuterol  HTN. PT takes amlodipine 5mg , Coreg 25mg  BID, hydralazine 25mg  BID  at home.  Patient hypertensive on admission in the 500B systolic. -Continue home meds  HLD - pt takes 20mg  lipitor at home. - Continue Lipitor  History of left leg DVT- pt takes warfarin 4mg  TThSS/5mg  MWF.  Patient has history of DVT in left leg.  Unknown when she first started warfarin, has been  at least 1 year.  Patient currently subtherapeutic with INR of 1.6.  - hold warfarin before surgery.  Giving 1 dose of subcu heparin now.   - Restart warfarin after surgery, pharmacy to dose, may need to bridge with heparin  Hx of stroke -patient states she had a stroke May of last year.  Was treated at [facility] in [place].  Does not appear to have any residual deficits.  Patient takes Brilinta 90 mg twice daily.  -Hold Brilinta.  Restart after surgery  FEN/GI: N.p.o. at midnight Prophylaxis: Subcu heparin.  Warfarin after surgery  Disposition: Med-surg  History of Present Illness:  Kristen Berry is a 63 y.o. female presenting with pain and necrosis of right first toe.  Patient for started noticing pain in her right foot 2 weeks ago and at the same time noticed she developed an ulceration.  Did not notice any ulcer before this time.  She states it has been progressively getting worse.  It is a constant pain with intermittent burning sensation.  Sometimes she gets numbness in the right foot.  She denies fever/chills.  She states she has been having itching going up the right calf during this time as well.  She has been given antibiotics with her last 2 dialysis sessions.  When she went to dialysis this morning they told her she should come in to the ED to have her foot evaluated.  She previously had second and third toe over the left foot amputated for osteomyelitis approximately 1 year ago.  Patient had vomiting and diarrhea for 2 days 2 weekends ago after her dialysis session.  Vomiting was nonbloody nonbilious.  Diarrhea was nonbloody.  Resolved after 2 days.  Currently denies nausea vomiting diarrhea, dysuria.  Review Of Systems: Per HPI with the following additions:   Review of Systems  Constitutional: Negative for chills and fever.  HENT: Negative for sore throat.   Respiratory: Positive for cough. Negative for shortness of breath.   Cardiovascular: Negative for chest pain  and leg swelling.  Gastrointestinal: Positive for constipation. Negative for abdominal pain, diarrhea, nausea and vomiting.  Genitourinary: Negative for dysuria.  Skin: Positive for itching and rash.  Neurological: Positive for dizziness and headaches. Negative for weakness.  Psychiatric/Behavioral: Negative for substance abuse.    Patient Active Problem List   Diagnosis Date Noted  . PAD (peripheral artery disease) ([place]) 01/24/2018  . Peripheral artery disease ([place]) 01/24/2018  . C. difficile diarrhea 01/06/2018  . Hypokalemia 01/06/2018  . ESRD (end stage renal disease) ([place]) 01/06/2018  . Prolonged QT interval 01/06/2018  . Hypotension 01/06/2018  . Multiple rib fractures 01/06/2018  . Pressure injury of skin 11/08/2017  . Coronary artery disease due to lipid rich plaque   . Chest pain   . Acute on chronic systolic CHF (congestive heart failure), NYHA class 3 ([place]) 10/18/2017  . PICC (peripherally inserted central catheter) in place   . Acute osteomyelitis of toe of left foot ([place])   . ARF (acute renal failure) ([place]) 10/10/2017  . AKI (acute kidney injury) ([place]) 10/10/2017  . Essential hypertension  12/14/2016  . Abnormal bone xray 06/15/2016  . MGUS (monoclonal gammopathy of unknown significance) 05/18/2016  . Stage 4 chronic kidney disease ([place]) 05/18/2016  . CHF (congestive heart failure) ([place]) 05/18/2016  . Renal insufficiency 06/28/2013  . Other and unspecified hyperlipidemia 06/28/2013  . Obesity, unspecified 06/28/2013  . Pain in limb 06/28/2013  . Type 2 diabetes mellitus with neurologic complication, without long-term current use of insulin ([place]) 06/28/2013    Past Medical History: Past Medical History:  Diagnosis Date  . Anemia   . Arthritis    "hands, knees" (10/10/2017)  . Asthma   . CHF (congestive heart failure) ([place])   . Chronic kidney disease (CKD), stage IV (severe) ([place])    Dialysis Berry/ Th/ Sat  . Coronary artery disease   . H/O Clostridium difficile  infection   . High cholesterol   . History of kidney stones   . Hypertension   . PVD (peripheral vascular disease) ([place])    "LLE; will have OR" (10/10/2017)  . Sleep apnea    "never given mask" (10/10/2017)  . Stroke [facility])    mild stroke mid April - 2019  . Type II diabetes mellitus ([place])    "no RX anymore" (10/10/2017)    Past Surgical History: Past Surgical History:  Procedure Laterality Date  . AMPUTATION Left 11/10/2017   Procedure: AMPUTATION DIGIT SECOND TOE LEFT FOOT;  Surgeon: Waynetta Sandy, MD;  Location: [place];  Service: Vascular;  Laterality: Left;  . AMPUTATION Left 01/24/2018   Procedure: AMPUTATION THIRD TOE LEFT FOOT;  Surgeon: Waynetta Sandy, MD;  Location: [place];  Service: Vascular;  Laterality: Left;  . AORTOGRAM N/A 11/03/2017   Procedure: Ultrasound Guided Cannulation Right Common Femoral Artery;  Aortagram with Left Lower Extremity Arteriogram; Attempted Treatment Left Superficial Femoral Artery; Percutaneous Closure Right Common Femoral Arteriotomy with Proglide Device;  Surgeon: Waynetta Sandy, MD;  Location: [facility] OR;  Service: Vascular;  Laterality: N/A;  . AV FISTULA PLACEMENT Left 11/10/2017   Procedure: ARTERIOVENOUS (AV) FISTULA CREATION LEFT UPPER ARM;  Surgeon: Waynetta Sandy, MD;  Location: [place];  Service: Vascular;  Laterality: Left;  . HERNIA REPAIR  1950s  . INSERTION OF DIALYSIS CATHETER Right 11/10/2017   Procedure: INSERTION OF TUNNELED DIALYSIS CATHETER RIGHT INTERNAL JUGULAR PLACEMENT;  Surgeon: Waynetta Sandy, MD;  Location: [place];  Service: Vascular;  Laterality: Right;  . IR FLUORO GUIDE CV LINE RIGHT  10/19/2017  . IR US GUIDE VASC ACCESS RIGHT  10/19/2017  . LEFT HEART CATH AND CORONARY ANGIOGRAPHY N/A 11/01/2017   Procedure: LEFT HEART CATH AND CORONARY ANGIOGRAPHY;  Surgeon: Belva Crome, MD;  Location: [place] CV LAB;  Service: Cardiovascular;  Laterality: N/A;  . PERIPHERAL VASCULAR  INTERVENTION Left 11/07/2017   Procedure: PERIPHERAL VASCULAR INTERVENTION;  Surgeon: Waynetta Sandy, MD;  Location: [place] CV LAB;  Service: Cardiovascular;  Laterality: Left;  left SFA  . SHOULDER OPEN ROTATOR CUFF REPAIR Left   . TUBAL LIGATION    . ULTRASOUND GUIDANCE FOR VASCULAR ACCESS  11/01/2017   Procedure: Ultrasound Guidance For Vascular Access;  Surgeon: Belva Crome, MD;  Location: [place] CV LAB;  Service: Cardiovascular;;    Social History: Social History   Tobacco Use  . Smoking status: Never Smoker  . Smokeless tobacco: Never Used  Substance Use Topics  . Alcohol use: No  . Drug use: No   Additional social history: Does not drink/smoke/do drugs.  Lives with daughter and  grandchildren. Please also refer to relevant sections of EMR.  Family History: Family History  Problem Relation Age of Onset  . Hypertension Father   . Heart failure Maternal Grandmother   . Heart failure Maternal Grandfather      Allergies and Medications: Allergies  Allergen Reactions  . Crestor [Rosuvastatin Calcium] Other (See Comments)    Leg pain   Current Facility-Administered Medications on File Prior to Encounter  Medication Dose Route Frequency Provider Last Rate Last Dose  . sodium chloride flush (NS) 0.9 % injection 3 mL  3 mL Intravenous PRN Serafina Mitchell, MD       Current Outpatient Medications on File Prior to Encounter  Medication Sig Dispense Refill  . acetaminophen (TYLENOL) 500 MG tablet Take 1,500 mg by mouth daily as needed for mild pain or headache.     . albuterol (PROAIR HFA) 108 (90 Base) MCG/ACT inhaler Inhale 2 puffs into the lungs every 6 (six) hours as needed for wheezing or shortness of breath.    [name] amLODipine (NORVASC) 5 MG tablet Take 5 mg by mouth every evening.     [name] atorvastatin (LIPITOR) 20 MG tablet Take 1 tablet (20 mg total) by mouth daily at 6 PM. 30 tablet 0  . calcium acetate (PHOSLO) 667 MG capsule Take 667 mg by mouth 2  (two) times daily with a meal.     . carvedilol (COREG) 25 MG tablet Take 25 mg by mouth 2 (two) times daily.    . clotrimazole-betamethasone (LOTRISONE) cream Apply 1 application topically daily as needed (rash).   0  . diphenoxylate-atropine (LOMOTIL) 2.5-0.025 MG tablet Take 1 tablet by mouth every 12 (twelve) hours as needed for diarrhea or loose stools.     . hydrALAZINE (APRESOLINE) 25 MG tablet Take 25 mg by mouth 2 (two) times daily.     [name] lidocaine-prilocaine (EMLA) cream Apply 1 application topically daily as needed (dialysis).     . nitroGLYCERIN (NITROSTAT) 0.4 MG SL tablet Place 0.4 mg under the tongue every 5 (five) minutes as needed for chest pain.    . pantoprazole (PROTONIX) 40 MG tablet Take 40 mg by mouth every evening.     . saccharomyces boulardii (FLORASTOR) 250 MG capsule Take 1 capsule (250 mg total) by mouth 2 (two) times daily. (Patient taking differently: Take 250 mg by mouth every evening. ) 20 capsule 0  . ticagrelor (BRILINTA) 90 MG TABS tablet Take 90 mg by mouth 2 (two) times daily.     [name] warfarin (COUMADIN) 4 MG tablet Take 4 mg by mouth See admin instructions. Sun Tues Thurs Sat    . warfarin (COUMADIN) 5 MG tablet Take 1 tablet (5 mg total) by mouth daily. Check INR at [facility] on Saturday (Patient taking differently: Take 5 mg by mouth See admin instructions. Mon Wed Fri) 30 tablet 0    Objective: BP (!) 134/59   Pulse (!) 108   Temp 98.1 F (36.7 C) (Oral)   Resp 18   Ht 5\' 7"  (1.702 m)   Wt 85 kg   SpO2 100%   BMI 29.35 kg/m  Exam: General: Alert, oriented.  No acute distress Eyes: PERRLA, EOMI, no scleral icterus ENTM: Moist oral mucosa.  Uvula midline.  No oropharyngeal erythema. Neck: No thyromegaly.  No lymphadenopathy Cardiovascular: Regular rate and rhythm.  No murmur.  2+ radial pulse left arm.  Could not palpate right radial pulse.  No pitting edema. Respiratory: Lungs clear to auscultation.  No crackles,  no wheezing.  No tachypnea.   No increased work of breathing. Gastrointestinal: Soft, nontender.  Normal bowel sounds. MSK: 5/5 upper and lower extremity bilaterally. Derm: Erythema of the right lower extremity from the foot to midway up the shin.  Tender to palpation not significantly warmer to touch than left.  Necrosis and ulceration of the right great toe Neuro: Cranial nerves II through XII grossly intact.  Sensation on lower extremities intact bilaterally. Psych: Pleasant affect.      Labs and Imaging: CBC BMET  Recent Labs  Lab 12/21/18 1732  WBC 12.0*  HGB 12.4  HCT 38.6  PLT 602*   Recent Labs  Lab 12/21/18 1732  NA 135  K 3.7  CL 98  CO2 25  BUN 7*  CREATININE 1.83*  GLUCOSE 90  CALCIUM 8.8Benay Pike, MD 12/21/2018, 6:54 PM PGY-1, [place] Intern pager: [phone], text pages welcome    FPTS Upper-Level Resident Addendum   I have independently interviewed and examined the patient. I have discussed the above with the original author and agree with their documentation. My edits for correction/addition/clarification are in pink. Please see also any attending notes.    Lucila Maine, DO PGY-3, [place] Service pager: [phone] (text pages welcome through [place])

## 2018-12-21 NOTE — ED Triage Notes (Signed)
Pt presents with necrotic R big toe, reports 1 week h/o of redness, drainage with foul odor. Received full treatment of HD today, with wound and blood cultures taken.

## 2018-12-21 NOTE — ED Provider Notes (Signed)
[facility] EMERGENCY DEPARTMENT Provider Note   CSN: [id number] Arrival date & time: 12/21/18  1701    History   Chief Complaint No chief complaint on file.   HPI AZALYA GALYON is a 63 y.o. female with history of PVD, CHF, ESRD, HTN, and diabetes who presents the emergency department complaining of pain, redness, and drainage from her right great toe over the past 2 weeks.  Patient denies any acute injury that led to the increased redness and swelling.  She states over the course of the last 2 weeks it has become increasingly erythematous, swollen, now with purulent drainage.  She states the end of her toe is now black and the toenail is falling off.  She is a Tuesday/Thursday/Saturday dialysis patient who is been compliant with all recent sessions.  She had blood cultures obtained at that facility which were negative however wound cultures were positive for gram-negative rods and MRSA.  She was started on vancomycin and ceftaz during dialysis sessions for the past 2 sessions.  Patient completed full dialysis earlier today with antibiotic administration prior to arrival at this emergency department.  She denies any recent fevers, chills, chest pain, shortness of breath, cough/cold/congestion, nausea/vomiting, or abdominal pain.      Illness  Severity:  Severe Onset quality:  Gradual Duration:  2 weeks Timing:  Constant Progression:  Worsening Chronicity:  Recurrent Associated symptoms: no abdominal pain, no chest pain, no cough, no ear pain, no fever, no rash, no shortness of breath, no sore throat and no vomiting     Past Medical History:  Diagnosis Date   Anemia    Arthritis    "hands, knees" (10/10/2017)   Asthma    CHF (congestive heart failure) (HCC)    Chronic kidney disease (CKD), stage IV (severe) ([place])    Dialysis T/ Th/ Sat   Coronary artery disease    H/O Clostridium difficile infection    High cholesterol    History of kidney stones     Hypertension    PVD (peripheral vascular disease) ([place])    "LLE; will have OR" (10/10/2017)   Sleep apnea    "never given mask" (10/10/2017)   Stroke ([place])    mild stroke mid April - 2019   Type II diabetes mellitus ([place])    "no RX anymore" (10/10/2017)    Patient Active Problem List   Diagnosis Date Noted   Osteomyelitis of toe of right foot ([place]) 12/21/2018   PAD (peripheral artery disease) ([place]) 01/24/2018   Peripheral artery disease ([place]) 01/24/2018   C. difficile diarrhea 01/06/2018   Hypokalemia 01/06/2018   ESRD (end stage renal disease) ([place]) 01/06/2018   Prolonged QT interval 01/06/2018   Hypotension 01/06/2018   Multiple rib fractures 01/06/2018   Pressure injury of skin 11/08/2017   Coronary artery disease due to lipid rich plaque    Chest pain    Acute on chronic systolic CHF (congestive heart failure), NYHA class 3 ([place]) 10/18/2017   PICC (peripherally inserted central catheter) in place    Acute osteomyelitis of toe of left foot ([place])    ARF (acute renal failure) ([place]) 10/10/2017   AKI (acute kidney injury) ([place]) 10/10/2017   Essential hypertension 12/14/2016   Abnormal bone xray 06/15/2016   MGUS (monoclonal gammopathy of unknown significance) 05/18/2016   Stage 4 chronic kidney disease ([place]) 05/18/2016   CHF (congestive heart failure) ([place]) 05/18/2016   Renal insufficiency 06/28/2013   Other and unspecified hyperlipidemia 06/28/2013   Obesity,  unspecified 06/28/2013   Pain in limb 06/28/2013   Type 2 diabetes mellitus with neurologic complication, without long-term current use of insulin ([place]) 06/28/2013    Past Surgical History:  Procedure Laterality Date   AMPUTATION Left 11/10/2017   Procedure: AMPUTATION DIGIT SECOND TOE LEFT FOOT;  Surgeon: Waynetta Sandy, MD;  Location: [facility];  Service: Vascular;  Laterality: Left;   AMPUTATION Left 01/24/2018   Procedure: AMPUTATION THIRD TOE LEFT FOOT;  Surgeon: Waynetta Sandy, MD;  Location: [facility];  Service: Vascular;  Laterality: Left;   AORTOGRAM N/A 11/03/2017   Procedure: Ultrasound Guided Cannulation Right Common Femoral Artery;  Aortagram with Left Lower Extremity Arteriogram; Attempted Treatment Left Superficial Femoral Artery; Percutaneous Closure Right Common Femoral Arteriotomy with Proglide Device;  Surgeon: Waynetta Sandy, MD;  Location: [facility];  Service: Vascular;  Laterality: N/A;   AV FISTULA PLACEMENT Left 11/10/2017   Procedure: ARTERIOVENOUS (AV) FISTULA CREATION LEFT UPPER ARM;  Surgeon: Waynetta Sandy, MD;  Location: [facility];  Service: Vascular;  Laterality: Left;   HERNIA REPAIR  1950s   INSERTION OF DIALYSIS CATHETER Right 11/10/2017   Procedure: INSERTION OF TUNNELED DIALYSIS CATHETER RIGHT INTERNAL JUGULAR PLACEMENT;  Surgeon: Waynetta Sandy, MD;  Location: [facility];  Service: Vascular;  Laterality: Right;   IR FLUORO GUIDE CV LINE RIGHT  10/19/2017   IR US GUIDE VASC ACCESS RIGHT  10/19/2017   LEFT HEART CATH AND CORONARY ANGIOGRAPHY N/A 11/01/2017   Procedure: LEFT HEART CATH AND CORONARY ANGIOGRAPHY;  Surgeon: Belva Crome, MD;  Location: [facility];  Service: Cardiovascular;  Laterality: N/A;   PERIPHERAL VASCULAR INTERVENTION Left 11/07/2017   Procedure: PERIPHERAL VASCULAR INTERVENTION;  Surgeon: Waynetta Sandy, MD;  Location: [facility];  Service: Cardiovascular;  Laterality: Left;  left SFA   SHOULDER OPEN ROTATOR CUFF REPAIR Left    TUBAL LIGATION     ULTRASOUND GUIDANCE FOR VASCULAR ACCESS  11/01/2017   Procedure: Ultrasound Guidance For Vascular Access;  Surgeon: Belva Crome, MD;  Location: [facility];  Service: Cardiovascular;;     OB History   No obstetric history on file.      Home Medications    Prior to Admission medications   Medication Sig Start Date End Date Taking? Authorizing Provider  acetaminophen (TYLENOL) 500 MG tablet Take  1,500 mg by mouth daily as needed for mild pain or headache.    Yes [provider]  albuterol (PROAIR HFA) 108 (90 Base) MCG/ACT inhaler Inhale 2 puffs into the lungs every 6 (six) hours as needed for wheezing or shortness of breath.   Yes [provider]  amLODipine (NORVASC) 5 MG tablet Take 5 mg by mouth every evening.    Yes [provider]  atorvastatin (LIPITOR) 20 MG tablet Take 1 tablet (20 mg total) by mouth daily at 6 PM. 11/16/17  Yes Domenic Polite, MD  calcium acetate (PHOSLO) 667 MG capsule Take 667 mg by mouth 2 (two) times daily with a meal.  10/27/18  Yes [provider]  carvedilol (COREG) 25 MG tablet Take 25 mg by mouth 2 (two) times daily. 08/26/18  Yes [provider]  clotrimazole-betamethasone (LOTRISONE) cream Apply 1 application topically daily as needed (rash).  01/19/18  Yes [provider]  diphenoxylate-atropine (LOMOTIL) 2.5-0.025 MG tablet Take 1 tablet by mouth every 12 (twelve) hours as needed for diarrhea or loose stools.    Yes [provider]  hydrALAZINE (  APRESOLINE) 25 MG tablet Take 25 mg by mouth 2 (two) times daily.  01/25/18  Yes Rhyne, Hulen Shouts, PA-C  lidocaine-prilocaine (EMLA) cream Apply 1 application topically daily as needed (dialysis).  09/28/18  Yes [provider]  nitroGLYCERIN (NITROSTAT) 0.4 MG SL tablet Place 0.4 mg under the tongue every 5 (five) minutes as needed for chest pain.   Yes [provider]  pantoprazole (PROTONIX) 40 MG tablet Take 40 mg by mouth every evening.    Yes [provider]  saccharomyces boulardii (FLORASTOR) 250 MG capsule Take 1 capsule (250 mg total) by mouth 2 (two) times daily. Patient taking differently: Take 250 mg by mouth every evening.  01/12/18  Yes Caren Griffins, MD  ticagrelor (BRILINTA) 90 MG TABS tablet Take 90 mg by mouth 2 (two) times daily.    Yes [provider]  warfarin (COUMADIN) 4 MG tablet Take 4 mg by  mouth See admin instructions. Lydia Guiles Thurs Sat   Yes [provider]  warfarin (COUMADIN) 5 MG tablet Take 1 tablet (5 mg total) by mouth daily. Check INR at [facility] on Saturday Patient taking differently: Take 5 mg by mouth See admin instructions. Mon Wed Fri 11/16/17  Yes Domenic Polite, MD    Family History Family History  Problem Relation Age of Onset   Hypertension Father    Heart failure Maternal Grandmother    Heart failure Maternal Grandfather     Social History Social History   Tobacco Use   Smoking status: Never Smoker   Smokeless tobacco: Never Used  Substance Use Topics   Alcohol use: No   Drug use: No     Allergies   Crestor [rosuvastatin calcium]   Review of Systems Review of Systems  Constitutional: Negative for chills and fever.  HENT: Negative for ear pain and sore throat.   Eyes: Negative for pain and visual disturbance.  Respiratory: Negative for cough and shortness of breath.   Cardiovascular: Negative for chest pain and palpitations.  Gastrointestinal: Negative for abdominal pain and vomiting.  Genitourinary: Negative for dysuria and hematuria.  Musculoskeletal: Negative for arthralgias and back pain.       Right great toe pain, swelling, and drainage.   Skin: Negative for color change and rash.  Neurological: Negative for seizures and syncope.  All other systems reviewed and are negative.    Physical Exam Updated Vital Signs BP (!) 140/57    Pulse (!) 105    Temp 98.1 F (36.7 C) (Oral)    Resp 18    Ht '5\' 7"'  (1.702 m)    Wt 85 kg    SpO2 100%    BMI 29.35 kg/m   Physical Exam Vitals signs and nursing note reviewed.  Constitutional:      General: She is not in acute distress.    Appearance: She is well-developed.  HENT:     Head: Normocephalic and atraumatic.  Eyes:     Conjunctiva/sclera: Conjunctivae normal.  Neck:     Musculoskeletal: Neck supple.  Cardiovascular:     Rate and Rhythm: Normal rate and  regular rhythm.     Heart sounds: No murmur.  Pulmonary:     Effort: Pulmonary effort is normal. No respiratory distress.     Breath sounds: Normal breath sounds.  Abdominal:     Palpations: Abdomen is soft.     Tenderness: There is no abdominal tenderness.  Musculoskeletal:     Comments: Swelling and fluctuance over the right great  toe.  Black eschar noted at the distal portion with nail minimally attached.  She has surrounding erythema that involves the remaining foot as well as extending proximally to the mid shin.  2+ dorsalis pedis pulses bilaterally.  Skin:    General: Skin is warm and dry.  Neurological:     General: No focal deficit present.     Mental Status: She is alert and oriented to person, place, and time.      ED Treatments / Results  Labs (all labs ordered are listed, but only abnormal results are displayed) Labs Reviewed  CBC WITH DIFFERENTIAL/PLATELET - Abnormal; Notable for the following components:      Result Value   WBC 12.0 (*)    RBC 3.80 (*)    MCV 101.6 (*)    Platelets 602 (*)    Neutro Abs 8.6 (*)    Abs Immature Granulocytes 0.10 (*)    All other components within normal limits  COMPREHENSIVE METABOLIC PANEL - Abnormal; Notable for the following components:   BUN 7 (*)    Creatinine, Ser 1.83 (*)    Calcium 8.8 (*)    Albumin 3.4 (*)    GFR calc non Af Amer 29 (*)    GFR calc Af Amer 33 (*)    All other components within normal limits  SEDIMENTATION RATE - Abnormal; Notable for the following components:   Sed Rate 100 (*)    All other components within normal limits  PROTIME-INR - Abnormal; Notable for the following components:   Prothrombin Time 18.4 (*)    INR 1.6 (*)    All other components within normal limits    EKG None  Radiology Dg Foot Complete Right  Result Date: 12/21/2018 CLINICAL DATA:  Pt presents with necrotic R big toe, reports 1 week h/o of redness, drainage with foul odor. Pt states the pain radiates from her big  toe up into her lower leg, right foot and lower leg has associated swelling and redness. Pt denies any injury to right foot. Hx of diabetes and PVD. EXAM: RIGHT FOOT COMPLETE - 3+ VIEW COMPARISON:  None. FINDINGS: There is resorption of a portion of the distal tuft, along its plantar lateral margin, and along the lateral aspect of the proximal to mid aspect of the great toe distal phalanx, findings consistent with osteomyelitis. There are no other areas of bone resorption to suggest additional osteomyelitis. No fracture.  Joints are normally aligned. There is soft tissue swelling of the great toe without soft tissue air. Large dorsal and plantar calcaneal spurs. IMPRESSION: 1. Osteomyelitis of the distal phalanx of the right great toe involving the distal tuft and lateral aspect of the proximal to mid phalanx. 2. There is surrounding soft tissue swelling, without soft tissue air. 3. No fracture, dislocation or other evidence of osteomyelitis. Electronically Signed   By: Lajean Manes M.D.   On: 12/21/2018 18:01    Procedures Procedures (including critical care time)  Medications Ordered in ED Medications - No data to display   Initial Impression / Assessment and Plan / ED Course  I have reviewed the triage vital signs and the nursing notes.  Pertinent labs & imaging results that were available during my care of the patient were reviewed by me and considered in my medical decision making (see chart for details).        Patient is a 63 year old female with peripheral vascular disease and ESRD, who presents the emergency department complaining of increasing swelling, pain,  and drainage of her right great toe.  Patient has had prior amputation of the left third toe for osteomyelitis in the past secondary to PVD.  She has been going to her scheduled dialysis sessions with wound cultures positive for gram-negative rods as well as MRSA.  She has received vancomycin as well as ceftaz during each of her  last 2 dialysis sessions.  She was sent to this hospital for further evaluation and care of osteomyelitis of the right great toe.  On initial evaluation of the patient she was hemodynamically stable and nontoxic-appearing.  Patient was afebrile, slightly tachycardic at 108, normotensive, satting 1% on room air.  Physical exam as detailed above which is remarkable for erythema, fluctuance, and eschar noted over the right great toe.  She also has cellulitic changes extending from the right great toe to involve the remaining foot as well as right shin.  As patient has just received doses of vancomycin and ceftaz while at dialysis earlier today additional antibiotics were not immediately initiated.  CBC obtained which shows leukocytosis of 12 and hemoglobin within normal limits.  Metabolic panel with no significant electrolyte derangements.  Potassium is within normal limits.  ESR elevated at 100  X-ray of the right great toe shows osteomyelitis of the distal phalanx of the right great toe involving the distal tuft and the lateral aspect of the proximal to mid phalanx.  No evidence of subcutaneous emphysema.  She also has no palpable crepitus on physical examination which would indicate necrotizing fasciitis.  Case was discussed with Dr. Jeannine Kitten who is in agreement with admission for ongoing evaluation and care of osteomyelitis.  Will defer any additional antibiotic regimen to inpatient team as she just received vancomycin and ceftaz prior to arrival.  Patient was in stable condition at time of admission.    Final Clinical Impressions(s) / ED Diagnoses   Final diagnoses:  Other acute osteomyelitis of right foot [facility])    ED Discharge Orders    None       Tommie Raymond, MD 12/21/18 2039    Rex Kras Wenda Overland, MD 12/26/18 [phone]

## 2018-12-22 ENCOUNTER — Encounter (HOSPITAL_COMMUNITY)
Admission: EM | Disposition: A | Discharge status: Skilled Nursing Facility | Payer: Self-pay | Source: Home / Self Care | Attending: Family Medicine

## 2018-12-22 ENCOUNTER — Inpatient Hospital Stay (HOSPITAL_COMMUNITY): Payer: Medicare HMO | Admitting: Anesthesiology

## 2018-12-22 ENCOUNTER — Encounter (HOSPITAL_COMMUNITY): Payer: Self-pay

## 2018-12-22 DIAGNOSIS — E1159 Type 2 diabetes mellitus with other circulatory complications: Secondary | ICD-10-CM

## 2018-12-22 DIAGNOSIS — M86171 Other acute osteomyelitis, right ankle and foot: Secondary | ICD-10-CM

## 2018-12-22 DIAGNOSIS — I5022 Chronic systolic (congestive) heart failure: Secondary | ICD-10-CM

## 2018-12-22 DIAGNOSIS — Z992 Dependence on renal dialysis: Secondary | ICD-10-CM

## 2018-12-22 DIAGNOSIS — I96 Gangrene, not elsewhere classified: Secondary | ICD-10-CM

## 2018-12-22 DIAGNOSIS — Z7901 Long term (current) use of anticoagulants: Secondary | ICD-10-CM

## 2018-12-22 DIAGNOSIS — M869 Osteomyelitis, unspecified: Secondary | ICD-10-CM

## 2018-12-22 DIAGNOSIS — I739 Peripheral vascular disease, unspecified: Secondary | ICD-10-CM

## 2018-12-22 DIAGNOSIS — N186 End stage renal disease: Secondary | ICD-10-CM

## 2018-12-22 HISTORY — PX: AMPUTATION: SHX166

## 2018-12-22 LAB — BASIC METABOLIC PANEL
Anion gap: 13 (ref 5–15)
BUN: 13 mg/dL (ref 8–23)
CO2: 24 mmol/L (ref 22–32)
Calcium: 9.2 mg/dL (ref 8.9–10.3)
Chloride: 99 mmol/L (ref 98–111)
Creatinine, Ser: 2.6 mg/dL — ABNORMAL HIGH (ref 0.44–1.00)
GFR calc Af Amer: 22 mL/min — ABNORMAL LOW (ref >60)
GFR calc non Af Amer: 19 mL/min — ABNORMAL LOW (ref >60)
Glucose, Bld: 101 mg/dL — ABNORMAL HIGH (ref 70–99)
Potassium: 4 mmol/L (ref 3.5–5.1)
Sodium: 136 mmol/L (ref 135–145)

## 2018-12-22 LAB — CBC
HCT: 37.5 % (ref 36.0–46.0)
Hemoglobin: 11.8 g/dL — ABNORMAL LOW (ref 12.0–15.0)
MCH: 32.2 pg (ref 26.0–34.0)
MCHC: 31.5 g/dL (ref 30.0–36.0)
MCV: 102.2 fL — ABNORMAL HIGH (ref 80.0–100.0)
Platelets: 563 10*3/uL — ABNORMAL HIGH (ref 150–400)
RBC: 3.67 MIL/uL — ABNORMAL LOW (ref 3.87–5.11)
RDW: 14.4 % (ref 11.5–15.5)
WBC: 11.5 10*3/uL — ABNORMAL HIGH (ref 4.0–10.5)
nRBC: 0 % (ref 0.0–0.2)

## 2018-12-22 LAB — HEMOGLOBIN A1C
Hgb A1c MFr Bld: 5.5 % (ref 4.8–5.6)
Mean Plasma Glucose: 111.15 mg/dL

## 2018-12-22 LAB — GLUCOSE, CAPILLARY
Glucose-Capillary: 94 mg/dL (ref 70–99)
Glucose-Capillary: 90 mg/dL (ref 70–99)
Glucose-Capillary: 108 mg/dL — ABNORMAL HIGH (ref 70–99)
Glucose-Capillary: 143 mg/dL — ABNORMAL HIGH (ref 70–99)
Glucose-Capillary: 170 mg/dL — ABNORMAL HIGH (ref 70–99)

## 2018-12-22 LAB — SURGICAL PCR SCREEN
MRSA, PCR: NEGATIVE
Staphylococcus aureus: NEGATIVE

## 2018-12-22 LAB — HEPARIN LEVEL (UNFRACTIONATED): Heparin Unfractionated: 0.27 IU/mL — ABNORMAL LOW (ref 0.30–0.70)

## 2018-12-22 LAB — HIV ANTIBODY (ROUTINE TESTING W REFLEX): HIV Screen 4th Generation wRfx: NONREACTIVE

## 2018-12-22 LAB — APTT: aPTT: 51 seconds — ABNORMAL HIGH (ref 24–36)

## 2018-12-22 LAB — PROTIME-INR
INR: 1.7 — ABNORMAL HIGH (ref 0.8–1.2)
Prothrombin Time: 19.9 seconds — ABNORMAL HIGH (ref 11.4–15.2)

## 2018-12-22 SURGERY — AMPUTATION DIGIT
Anesthesia: Monitor Anesthesia Care | Laterality: Right

## 2018-12-22 MED ORDER — DOCUSATE SODIUM 100 MG PO CAPS
100.0000 mg | ORAL_CAPSULE | Freq: Two times a day (BID) | ORAL | Status: DC
  Administered 2018-12-22 – 2018-12-25 (×7): 100 mg via ORAL
  Filled 2018-12-22 (×7): qty 1

## 2018-12-22 MED ORDER — MIDAZOLAM HCL 2 MG/2ML IJ SOLN
INTRAMUSCULAR | Status: AC
  Filled 2018-12-22: qty 2

## 2018-12-22 MED ORDER — SUCCINYLCHOLINE CHLORIDE 20 MG/ML IJ SOLN
INTRAMUSCULAR | Status: DC | PRN
  Administered 2018-12-22: 100 mg via INTRAVENOUS

## 2018-12-22 MED ORDER — FENTANYL CITRATE (PF) 100 MCG/2ML IJ SOLN: 25.0000 ug | INTRAMUSCULAR | Status: DC | PRN

## 2018-12-22 MED ORDER — SODIUM CHLORIDE 0.9 % IV SOLN
INTRAVENOUS | Status: DC
  Administered 2018-12-22: 14:00:00 via INTRAVENOUS

## 2018-12-22 MED ORDER — METOCLOPRAMIDE HCL 5 MG/ML IJ SOLN: 5.0000 mg | Freq: Three times a day (TID) | INTRAMUSCULAR | Status: DC | PRN

## 2018-12-22 MED ORDER — METHOCARBAMOL 500 MG PO TABS: 500.0000 mg | ORAL_TABLET | Freq: Four times a day (QID) | ORAL | Status: DC | PRN

## 2018-12-22 MED ORDER — CEFAZOLIN SODIUM-DEXTROSE 2-4 GM/100ML-% IV SOLN
INTRAVENOUS | Status: AC
  Filled 2018-12-22: qty 100

## 2018-12-22 MED ORDER — DEXAMETHASONE SODIUM PHOSPHATE 10 MG/ML IJ SOLN
INTRAMUSCULAR | Status: DC | PRN
  Administered 2018-12-22: 4 mg via INTRAVENOUS

## 2018-12-22 MED ORDER — SODIUM CHLORIDE 0.9 % IV SOLN: INTRAVENOUS | Status: DC

## 2018-12-22 MED ORDER — POLYETHYLENE GLYCOL 3350 17 G PO PACK: 17.0000 g | PACK | Freq: Every day | ORAL | Status: DC | PRN

## 2018-12-22 MED ORDER — PHENYLEPHRINE 40 MCG/ML (10ML) SYRINGE FOR IV PUSH (FOR BLOOD PRESSURE SUPPORT)
PREFILLED_SYRINGE | INTRAVENOUS | Status: AC
  Filled 2018-12-22: qty 10

## 2018-12-22 MED ORDER — METOCLOPRAMIDE HCL 5 MG PO TABS: 5.0000 mg | ORAL_TABLET | Freq: Three times a day (TID) | ORAL | Status: DC | PRN

## 2018-12-22 MED ORDER — CHLORHEXIDINE GLUCONATE 4 % EX LIQD: 60.0000 mL | Freq: Once | CUTANEOUS | Status: AC

## 2018-12-22 MED ORDER — FENTANYL CITRATE (PF) 100 MCG/2ML IJ SOLN
INTRAMUSCULAR | Status: DC | PRN
  Administered 2018-12-22: 100 ug via INTRAVENOUS

## 2018-12-22 MED ORDER — PHENYLEPHRINE 40 MCG/ML (10ML) SYRINGE FOR IV PUSH (FOR BLOOD PRESSURE SUPPORT)
PREFILLED_SYRINGE | INTRAVENOUS | Status: DC | PRN
  Administered 2018-12-22: 80 ug via INTRAVENOUS

## 2018-12-22 MED ORDER — METHOCARBAMOL 1000 MG/10ML IJ SOLN
500.0000 mg | Freq: Four times a day (QID) | INTRAVENOUS | Status: DC | PRN
  Filled 2018-12-22: qty 5

## 2018-12-22 MED ORDER — CEFAZOLIN SODIUM-DEXTROSE 2-4 GM/100ML-% IV SOLN: 2.0000 g | INTRAVENOUS | Status: DC

## 2018-12-22 MED ORDER — FENTANYL CITRATE (PF) 250 MCG/5ML IJ SOLN
INTRAMUSCULAR | Status: AC
  Filled 2018-12-22: qty 5

## 2018-12-22 MED ORDER — OXYCODONE HCL 5 MG PO TABS
5.0000 mg | ORAL_TABLET | ORAL | Status: DC | PRN
  Administered 2018-12-22: 5 mg via ORAL
  Filled 2018-12-22 (×2): qty 1

## 2018-12-22 MED ORDER — HYDROMORPHONE HCL 1 MG/ML IJ SOLN
0.5000 mg | INTRAMUSCULAR | Status: DC | PRN
  Administered 2018-12-22 – 2018-12-23 (×4): 0.5 mg via INTRAVENOUS
  Filled 2018-12-22 (×3): qty 1

## 2018-12-22 MED ORDER — CALCITRIOL 0.25 MCG PO CAPS
0.7500 ug | ORAL_CAPSULE | ORAL | Status: DC
  Administered 2018-12-23 – 2018-12-30 (×5): 0.75 ug via ORAL
  Filled 2018-12-22: qty 3

## 2018-12-22 MED ORDER — ONDANSETRON HCL 4 MG PO TABS
4.0000 mg | ORAL_TABLET | Freq: Four times a day (QID) | ORAL | Status: DC | PRN
  Administered 2018-12-25: 4 mg via ORAL
  Filled 2018-12-22: qty 1

## 2018-12-22 MED ORDER — ONDANSETRON HCL 4 MG/2ML IJ SOLN
INTRAMUSCULAR | Status: DC | PRN
  Administered 2018-12-22: 4 mg via INTRAVENOUS

## 2018-12-22 MED ORDER — POVIDONE-IODINE 10 % EX SWAB: 2.0000 "application " | Freq: Once | CUTANEOUS | Status: AC

## 2018-12-22 MED ORDER — ONDANSETRON HCL 4 MG/2ML IJ SOLN: 4.0000 mg | Freq: Four times a day (QID) | INTRAMUSCULAR | Status: DC | PRN

## 2018-12-22 MED ORDER — TICAGRELOR 90 MG PO TABS: 90.0000 mg | ORAL_TABLET | Freq: Two times a day (BID) | ORAL | Status: DC

## 2018-12-22 MED ORDER — 0.9 % SODIUM CHLORIDE (POUR BTL) OPTIME
TOPICAL | Status: DC | PRN
  Administered 2018-12-22: 1000 mL

## 2018-12-22 MED ORDER — HEPARIN (PORCINE) 25000 UT/250ML-% IV SOLN
1450.0000 [IU]/h | INTRAVENOUS | Status: DC
  Administered 2018-12-22: 1250 [IU]/h via INTRAVENOUS
  Administered 2018-12-23 – 2018-12-25 (×3): 1350 [IU]/h via INTRAVENOUS
  Filled 2018-12-22 (×4): qty 250

## 2018-12-22 MED ORDER — CHLORHEXIDINE GLUCONATE CLOTH 2 % EX PADS
6.0000 | MEDICATED_PAD | Freq: Every day | CUTANEOUS | Status: DC
  Administered 2018-12-23 – 2018-12-25 (×2): 6 via TOPICAL

## 2018-12-22 MED ORDER — RENA-VITE PO TABS
1.0000 | ORAL_TABLET | Freq: Every day | ORAL | Status: DC
  Administered 2018-12-22 – 2018-12-30 (×9): 1 via ORAL
  Filled 2018-12-22 (×9): qty 1

## 2018-12-22 MED ORDER — PROPOFOL 10 MG/ML IV BOLUS
INTRAVENOUS | Status: DC | PRN
  Administered 2018-12-22: 160 mg via INTRAVENOUS

## 2018-12-22 MED ORDER — MAGNESIUM CITRATE PO SOLN: 1.0000 | Freq: Once | ORAL | Status: DC | PRN

## 2018-12-22 MED ORDER — PENTOXIFYLLINE ER 400 MG PO TBCR
400.0000 mg | EXTENDED_RELEASE_TABLET | Freq: Three times a day (TID) | ORAL | Status: DC
  Administered 2018-12-22: 400 mg via ORAL
  Filled 2018-12-22: qty 1

## 2018-12-22 MED ORDER — WARFARIN SODIUM 7.5 MG PO TABS: 7.5000 mg | ORAL_TABLET | Freq: Once | ORAL | Status: DC

## 2018-12-22 MED ORDER — WARFARIN - PHARMACIST DOSING INPATIENT: Freq: Every day | Status: DC

## 2018-12-22 MED ORDER — BISACODYL 10 MG RE SUPP: 10.0000 mg | Freq: Every day | RECTAL | Status: DC | PRN

## 2018-12-22 SURGICAL SUPPLY — 34 items
BLADE SURG 21 STRL SS (BLADE) IMPLANT
BNDG COHESIVE 4X5 TAN STRL (GAUZE/BANDAGES/DRESSINGS) ×2 IMPLANT
BNDG ESMARK 4X9 LF (GAUZE/BANDAGES/DRESSINGS) IMPLANT
BNDG GAUZE ELAST 4 BULKY (GAUZE/BANDAGES/DRESSINGS) ×2 IMPLANT
COVER SURGICAL LIGHT HANDLE (MISCELLANEOUS) ×4 IMPLANT
COVER WAND RF STERILE (DRAPES) IMPLANT
DRAPE U-SHAPE 47X51 STRL (DRAPES) IMPLANT
DRSG ADAPTIC 3X8 NADH LF (GAUZE/BANDAGES/DRESSINGS) IMPLANT
DRSG PAD ABDOMINAL 8X10 ST (GAUZE/BANDAGES/DRESSINGS) IMPLANT
DURAPREP 26ML APPLICATOR (WOUND CARE) ×2 IMPLANT
ELECT REM PT RETURN 9FT ADLT (ELECTROSURGICAL) ×2
ELECTRODE REM PT RTRN 9FT ADLT (ELECTROSURGICAL) ×1 IMPLANT
GAUZE SPONGE 4X4 12PLY STRL (GAUZE/BANDAGES/DRESSINGS) IMPLANT
GLOVE BIO SURGEON STRL SZ 6.5 (GLOVE) ×2 IMPLANT
GLOVE BIOGEL PI IND STRL 6.5 (GLOVE) ×1 IMPLANT
GLOVE BIOGEL PI IND STRL 9 (GLOVE) ×1 IMPLANT
GLOVE BIOGEL PI INDICATOR 6.5 (GLOVE) ×1
GLOVE BIOGEL PI INDICATOR 9 (GLOVE) ×1
GLOVE SURG ORTHO 9.0 STRL STRW (GLOVE) ×2 IMPLANT
GLOVE SURG SS PI 8.0 STRL IVOR (GLOVE) ×4 IMPLANT
GOWN STRL REUS W/ TWL XL LVL3 (GOWN DISPOSABLE) ×2 IMPLANT
GOWN STRL REUS W/TWL XL LVL3 (GOWN DISPOSABLE) ×2
KIT BASIN OR (CUSTOM PROCEDURE TRAY) ×2 IMPLANT
KIT DRSG PREVENA PLUS 7DAY 125 (MISCELLANEOUS) ×2 IMPLANT
KIT PREVENA INCISION MGT 13 (CANNISTER) ×2 IMPLANT
KIT TURNOVER KIT B (KITS) ×2 IMPLANT
MANIFOLD NEPTUNE II (INSTRUMENTS) IMPLANT
NEEDLE 22X1 1/2 (OR ONLY) (NEEDLE) IMPLANT
NS IRRIG 1000ML POUR BTL (IV SOLUTION) ×2 IMPLANT
PACK ORTHO EXTREMITY (CUSTOM PROCEDURE TRAY) IMPLANT
PAD ARMBOARD 7.5X6 YLW CONV (MISCELLANEOUS) ×4 IMPLANT
SUT ETHILON 2 0 PSLX (SUTURE) ×2 IMPLANT
SYR CONTROL 10ML LL (SYRINGE) IMPLANT
TOWEL OR 17X26 10 PK STRL BLUE (TOWEL DISPOSABLE) ×2 IMPLANT

## 2018-12-22 NOTE — Consult Note (Addendum)
REASON FOR CONSULT:    Right foot wound with peripheral vascular disease.  The consult is requested by Dr. 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  I will discuss the intraoperative findings with Dr. Sharol Given.  We will try to get her on for arteriography early next week.  Fortunately she is not a smoker.  I have discussed with her the indications for arteriography and the potential complications.  I will follow.  ANTICOAGULATION: The patient was on Coumadin for a prior DVT.  Is not clear when the DVT was but it has been likely over a year.  I have held her Coumadin in anticipation of arteriography next week.  She is on intravenous heparin.   END-STAGE RENAL DISEASE: The patient dialyzes on Tuesdays Thursdays and Saturdays normally.  She has a functioning catheter.  She had a fistula in her left upper arm which has not matured adequately.  She was evaluated in our office on 10/31/2018 by Dr. Carlis Abbott.  The fistula had been placed in February 2019.  A fistulogram was recommended but this had not been completed.   Deitra Mayo, MD, FACS Beeper [phone] Office: [phone]   HPI:   Kristen Berry is a pleasant 63 y.o. female, who tells me that she developed a wound on her right great toe several weeks ago and developed odor and drainage from this.  The patient was admitted yesterday by the medical service with osteomyelitis of the right great toe.  The patient was evaluated by Dr. Sharol Given and she was found to have an abscess and osteomyelitis of the right great toe.  Patient did not have palpable pedal pulses and for this reason vascular surgery is consulted.  Her risk factors for peripheral vascular disease include diabetes, hypertension, hypercholesterolemia.  She denies any family history of premature  cardiovascular disease or tobacco history.  I do not get any clear-cut history of claudication prior to this wound but I think her activity is somewhat limited.  She has had 2 previous toes amputated on the left foot.  In February 2019, the patient underwent angioplasty and stenting of the left superficial femoral artery by Dr. Donzetta Matters.   Past Medical History:  Diagnosis Date  . Anemia   . Arthritis    "hands, knees" (10/10/2017)  . Asthma   . CHF (congestive heart failure) ([place])   . Chronic kidney disease (CKD), stage IV (severe) ([place])    Dialysis T/ Th/ Sat  . Coronary artery disease   . H/O Clostridium difficile infection   . High cholesterol   . History of kidney stones   . Hypertension   . PVD (peripheral vascular disease) ([place])    "LLE; will have OR" (10/10/2017)  . Sleep apnea    "never given mask" (10/10/2017)  . Stroke [facility])    mild stroke mid April - 2019  . Type II diabetes mellitus ([place])    "no RX anymore" (10/10/2017)    Family History  Problem Relation Age of Onset  . Hypertension Father   . Heart failure Maternal Grandmother   . Heart failure Maternal Grandfather     SOCIAL HISTORY: Social History   Socioeconomic History  . Marital status: Divorced    Spouse name: Not on file  . Number of children: 3  . Years of  education: Not on file  . Highest education level: Not on file  Occupational History  . Occupation: disabled  Social Needs  . Financial resource strain: Not on file  . Food insecurity:    Worry: Not on file    Inability: Not on file  . Transportation needs:    Medical: Not on file    Non-medical: Not on file  Tobacco Use  . Smoking status: Never Smoker  . Smokeless tobacco: Never Used  Substance and Sexual Activity  . Alcohol use: No  . Drug use: No  . Sexual activity: Never  Lifestyle  . Physical activity:    Days per week: Not on file    Minutes per session: Not on file  . Stress: Not on file  Relationships  . Social connections:     Talks on phone: Not on file    Gets together: Not on file    Attends religious service: Not on file    Active member of club or organization: Not on file    Attends meetings of clubs or organizations: Not on file    Relationship status: Not on file  . Intimate partner violence:    Fear of current or ex partner: Not on file    Emotionally abused: Not on file    Physically abused: Not on file    Forced sexual activity: Not on file  Other Topics Concern  . Not on file  Social History Narrative  . Not on file    Allergies  Allergen Reactions  . Crestor [Rosuvastatin Calcium] Other (See Comments)    Leg pain    Current Facility-Administered Medications  Medication Dose Route Frequency Provider Last Rate Last Dose  . 0.9 %  sodium chloride infusion  250 mL Intravenous PRN Newt Minion, MD      . 0.9 %  sodium chloride infusion   Intravenous Continuous Newt Minion, MD      . acetaminophen (TYLENOL) tablet 650 mg  650 mg Oral Q6H PRN Newt Minion, MD       Or  . acetaminophen (TYLENOL) suppository 650 mg  650 mg Rectal Q6H PRN Newt Minion, MD      . amLODipine (NORVASC) tablet 5 mg  5 mg Oral QPM Newt Minion, MD   5 mg at 12/21/18 2230  . atorvastatin (LIPITOR) tablet 20 mg  20 mg Oral q1800 Newt Minion, MD   20 mg at 12/21/18 2230  . bisacodyl (DULCOLAX) suppository 10 mg  10 mg Rectal Daily PRN Newt Minion, MD      . Derrill Memo ON 12/23/2018] calcitRIOL (ROCALTROL) capsule 0.75 mcg  0.75 mcg Oral Q T,Th,Sa-HD Newt Minion, MD      . calcium acetate (PHOSLO) capsule 667 mg  667 mg Oral BID WC Newt Minion, MD      . carvedilol (COREG) tablet 25 mg  25 mg Oral BID Newt Minion, MD   25 mg at 12/22/18 1233  . [START ON 12/23/2018] cefTAZidime (FORTAZ) 2 g in sodium chloride 0.9 % 100 mL IVPB  2 g Intravenous Q T,Th,Sa-HD Newt Minion, MD      . Chlorhexidine Gluconate Cloth 2 % PADS 6 each  6 each Topical Q0600 Newt Minion, MD      . docusate sodium (COLACE)  capsule 100 mg  100 mg Oral BID Newt Minion, MD      . heparin ADULT infusion 100 units/mL (25000 units/219mL sodium  chloride 0.45%)  1,250 Units/hr Intravenous Continuous Karren Cobble, RPH      . hydrALAZINE (APRESOLINE) tablet 25 mg  25 mg Oral BID Newt Minion, MD   25 mg at 12/22/18 1048  . HYDROmorphone (DILAUDID) injection 0.5 mg  0.5 mg Intravenous Q4H PRN Newt Minion, MD      . insulin aspart (novoLOG) injection 0-9 Units  0-9 Units Subcutaneous TID WC Newt Minion, MD      . magnesium citrate solution 1 Bottle  1 Bottle Oral Once PRN Newt Minion, MD      . methocarbamol (ROBAXIN) tablet 500 mg  500 mg Oral Q6H PRN Newt Minion, MD       Or  . methocarbamol (ROBAXIN) 500 mg in dextrose 5 % 50 mL IVPB  500 mg Intravenous Q6H PRN Newt Minion, MD      . metoCLOPramide (REGLAN) tablet 5-10 mg  5-10 mg Oral Q8H PRN Newt Minion, MD       Or  . metoCLOPramide (REGLAN) injection 5-10 mg  5-10 mg Intravenous Q8H PRN Newt Minion, MD      . multivitamin (RENA-VIT) tablet 1 tablet  1 tablet Oral QHS Newt Minion, MD      . ondansetron [facility]) tablet 4 mg  4 mg Oral Q6H PRN Newt Minion, MD       Or  . ondansetron [facility]) injection 4 mg  4 mg Intravenous Q6H PRN Newt Minion, MD      . oxyCODONE (Oxy IR/ROXICODONE) immediate release tablet 5 mg  5 mg Oral Q4H PRN Newt Minion, MD   5 mg at 12/22/18 1541  . pentoxifylline (TRENTAL) CR tablet 400 mg  400 mg Oral TID WC Newt Minion, MD      . polyethylene glycol (MIRALAX / GLYCOLAX) packet 17 g  17 g Oral Daily PRN Newt Minion, MD      . sodium chloride flush (NS) 0.9 % injection 3 mL  3 mL Intravenous Q12H Newt Minion, MD   3 mL at 12/22/18 1048  . sodium chloride flush (NS) 0.9 % injection 3 mL  3 mL Intravenous PRN Newt Minion, MD      . ticagrelor [facility]) tablet 90 mg  90 mg Oral BID Daisy Floro, DO      . [START ON 12/23/2018] vancomycin (VANCOCIN) 1,000 mg in sodium chloride 0.9 % 250  mL IVPB  1,000 mg Intravenous Q T,Th,Sa-HD Newt Minion, MD      . warfarin (COUMADIN) tablet 7.5 mg  7.5 mg Oral ONCE-1800 [place] [place], [place]      . Warfarin - Pharmacist Dosing Inpatient   Does not apply q1800 Karren Cobble, RPH        REVIEW OF SYSTEMS:  [X]  denotes positive finding, [ ]  denotes negative finding Cardiac  Comments:  Chest pain or chest pressure:    Shortness of breath upon exertion:    Short of breath when lying flat:    Irregular heart rhythm:        Vascular    Pain in calf, thigh, or hip brought on by ambulation:    Pain in feet at night that wakes you up from your sleep:     Blood clot in your veins:    Leg swelling:         Pulmonary    Oxygen at home:    Productive cough:  Wheezing:         Neurologic    Sudden weakness in arms or legs:     Sudden numbness in arms or legs:     Sudden onset of difficulty speaking or slurred speech:    Temporary loss of vision in one eye:     Problems with dizziness:         Gastrointestinal    Blood in stool:     Vomited blood:         Genitourinary    Burning when urinating:     Blood in urine:        Psychiatric    Major depression:         Hematologic    Bleeding problems:    Problems with blood clotting too easily:        Skin    Rashes or ulcers:        Constitutional    Fever or chills:     PHYSICAL EXAM:   Vitals:   12/22/18 1459 12/22/18 1512 12/22/18 1515 12/22/18 1531  BP:  (!) 106/40  (!) 116/53  Pulse:  89  96  Resp:  17  18  Temp: 97.7 F (36.5 C)  97.8 F (36.6 C) 98.4 F (36.9 C)  TempSrc:    Oral  SpO2:  95%  94%  Weight:      Height:        GENERAL: The patient is a well-nourished female, in no acute distress. The vital signs are documented above. CARDIAC: There is a regular rate and rhythm.  VASCULAR: On the right side, which is the symptomatic side, there is a palpable femoral pulse.  I cannot palpate a popliteal pulse.  There is a dressing on the  right foot so I cannot assess the Doppler signals in the right foot. On the left side, there is a palpable femoral pulse.  I cannot palpate popliteal or pedal pulses. PULMONARY: There is good air exchange bilaterally without wheezing or rales. ABDOMEN: Soft and non-tender with normal pitched bowel sounds.  MUSCULOSKELETAL: The patient's second and third toes have been amputated from the left foot and this is healed. On the right side the great toe has been amputated and there is a VAC present with a dressing on the right foot. NEUROLOGIC: No focal weakness or paresthesias are detected. SKIN:  PSYCHIATRIC: The patient has a normal affect.  DATA:    ARTERIAL DOPPLER STUDY: I did review the arterial Doppler study when she was last seen in our office almost a year ago.  This was on 01/20/2018.  At that time:  On the right side there was a dampened monophasic dorsalis pedis and posterior tibial signal with an ABI 57%.  On the left side there was a monophasic dorsalis pedis signal with an ABI of 95%.

## 2018-12-22 NOTE — Transfer of Care (Deleted)
Immediate Anesthesia Transfer of Care Note  Patient: Kristen Berry  Procedure(s) Performed: Right foot 1st ray amputation (Right )  Patient Location: PACU  Anesthesia Type:General  Level of Consciousness: drowsy  Airway & Oxygen Therapy: Patient Spontanous Breathing and Patient connected to nasal cannula oxygen  Post-op Assessment: Report given to RN, Post -op Vital signs reviewed and stable and Patient moving all extremities  Post vital signs: Reviewed and stable  Last Vitals:  Vitals Value Taken Time  BP    Temp    Pulse    Resp    SpO2      Last Pain:  Vitals:   12/22/18 1327  TempSrc:   PainSc: 0-No pain         Complications: No apparent anesthesia complications

## 2018-12-22 NOTE — Discharge Summary (Signed)
[facility] Discharge Summary  Patient name: Kristen Berry Medical record number: [id number] Date of birth: Dec 08, 1955 Age: 63 y.o. Gender: female Date of Admission: 12/21/2018  Date of Discharge: 12/28/2018 Admitting Physician: Leeanne Rio, MD  Primary Care Provider: Martinique, Sarah T, MD Consultants: Orthopedic surgery, nephrology, VVS  Indication for Hospitalization: Osteomyelitis of right great toe  Discharge Diagnoses/Problem List:  Osteomyelitis of right great toe Right leg cellulitis ESRD on HD TTS Type 2 diabetes CAD History of stroke Mild asthma History of bilateral DVTs HFrEF First-degree AV block Hypertension Hyperlipidemia MGUS  Disposition: SNF  Discharge Condition: Stable, improved  Discharge Exam: General: well nourished, well developed, NAD with non-toxic appearance HEENT: normocephalic, atraumatic, moist mucous membranes Cardiovascular: regular rate and rhythm without murmurs, rubs, or gallops, fistula site intact with good thrill Lungs: clear to auscultation bilaterally with normal work of breathing Abdomen: soft, non-tender, non-distended, normoactive bowel sounds Skin: warm, dry, no rashes or lesions, cap refill < 2 seconds Extremities: warm and well perfused, normal tone, no edema, wound vac in place with good output and dry dressings   Brief Hospital Course:  Kristen Berry is a 63 year old female admitted to the hospital for infection of her right great toe that was found during a dialysis session.  X-ray of the toe revealed osteomyelitis. IV antibiotics vancomycin and ceftazidime with hemodialysis were started on 3/26 to be continued through 01/03/2019. Orthopedic surgery was consulted and the toe was removed on 12/22/2018.  During the toe amputation procedure it was noted the patient had poor blood flow to the foot. Vascular Surgery was consulted for right leg arteriography/left upper extremity fistulogram.  While awaiting  these procedures the patient was continued on Brilinta, Trental, and a heparin drip for her history of stroke and bilateral lower extremity DVTs status post amputation of 2 toes of her left foot.   The fistulogram and arteriography were performed on 12/27/2018. RLE arteriogram showed midportion SFA occlusion and reconstitution. A stent was placed. LLE arteriogram revealed stenosis within previous segmented stent. Patient with only single vessel runoff vita ATA on right. Patient was started on eliquis 2.5mg  bid after her procedure which she was discharged on. She will follow up with Dr. Sharol Given in one week for re-eval of toe. Will follow up with Dr. Trula Slade in 3-4 weeks for BLE duplex U/S and ABIs. Fistula was noted to be patent. Will need elevation and branch vessel ligation electively as outpatient.  Per request of SNF, patient was to have a test to r/o COVID19 before acceptance. SARS-CoV-2 was negative.   Issues for Follow Up:  1. Follow up with Dr. Sharol Given in one week 2. Follow up with Dr. Trula Slade in 3-4 weeks 3. Continue brilinta and asa for DAPT 4. Stopped Warfarin, continue eliquis for anticoag. Patient on multiple antiplatelets and anticoagulation. Consider escalation if felt necessary.   Significant Procedures:  12/22/2018 - Amputation of R great toe 12/27/2018 - fistulagram, RLE/LLE arteriogram with stent placement in RLE  Significant Labs and Imaging:  Recent Labs  Lab 12/28/18 0318 12/29/18 0226 12/30/18 0235  WBC 13.0* 14.4* 14.3*  HGB 11.3* 11.0* 10.7*  HCT 35.2* 34.8* 33.0*  PLT 477* 434* 426*   Recent Labs  Lab 12/27/18 0452 12/27/18 1537 12/28/18 0318 12/29/18 0226 12/30/18 0235  NA 133* 130* 129* 131* 130*  K 4.2 4.5 4.5 3.6 3.7  CL 93* 95* 94* 92* 95*  CO2 26 23 21* 25 24  GLUCOSE 112* 110* 90 88 118*  BUN  24* 28* 37* 18 34*  CREATININE 3.76* 3.96* 4.46* 3.24* 4.73*  CALCIUM 9.8 9.3 9.2 8.9 9.1  PHOS 4.0 5.1* 5.4* 3.7 5.1*  ALBUMIN 3.1* 3.1* 3.0* 3.0* 2.9*   Dg  Foot Complete Right  Result Date: 12/21/2018 CLINICAL DATA:  Pt presents with necrotic R big toe, reports 1 week h/o of redness, drainage with foul odor. Pt states the pain radiates from her big toe up into her lower leg, right foot and lower leg has associated swelling and redness. Pt denies any injury to right foot. Hx of diabetes and PVD. EXAM: RIGHT FOOT COMPLETE - 3+ VIEW COMPARISON:  None. FINDINGS: There is resorption of a portion of the distal tuft, along its plantar lateral margin, and along the lateral aspect of the proximal to mid aspect of the great toe distal phalanx, findings consistent with osteomyelitis. There are no other areas of bone resorption to suggest additional osteomyelitis. No fracture.  Joints are normally aligned. There is soft tissue swelling of the great toe without soft tissue air. Large dorsal and plantar calcaneal spurs. IMPRESSION: 1. Osteomyelitis of the distal phalanx of the right great toe involving the distal tuft and lateral aspect of the proximal to mid phalanx. 2. There is surrounding soft tissue swelling, without soft tissue air. 3. No fracture, dislocation or other evidence of osteomyelitis. Electronically Signed   By: Lajean Manes M.D.   On: 12/21/2018 18:01   SARS-CoV-2 Panel Negative  Results/Tests Pending at Time of Discharge:  Unresulted Labs (From admission, onward)    Start     Ordered   12/28/18 0500  Renal function panel  Daily,   R     12/27/18 1328   12/23/18 0800  CBC  Daily,   R     12/22/18 2309           Discharge Medications:  Allergies as of 12/30/2018      Reactions   Crestor [rosuvastatin Calcium] Other (See Comments)   Leg pain      Medication List    STOP taking these medications   nitroGLYCERIN 0.4 MG SL tablet Commonly known as:  NITROSTAT   warfarin 4 MG tablet Commonly known as:  COUMADIN   warfarin 5 MG tablet Commonly known as:  Coumadin     TAKE these medications   acetaminophen 500 MG tablet Commonly known  as:  TYLENOL Take 1,500 mg by mouth daily as needed for mild pain or headache.   alteplase 2 MG injection Commonly known as:  CATHFLO ACTIVASE 2 mg by Intracatheter route once as needed for open catheter.   amLODipine 5 MG tablet Commonly known as:  NORVASC Take 5 mg by mouth every evening.   apixaban 2.5 MG Tabs tablet Commonly known as:  ELIQUIS Take 1 tablet (2.5 mg total) by mouth 2 (two) times daily.   aspirin 81 MG EC tablet Take 1 tablet (81 mg total) by mouth daily.   atorvastatin 20 MG tablet Commonly known as:  LIPITOR Take 1 tablet (20 mg total) by mouth daily at 6 PM.   calcitRIOL 0.25 MCG capsule Commonly known as:  ROCALTROL Take 3 capsules (0.75 mcg total) by mouth Every Tuesday,Thursday,and Saturday with dialysis.   calcium acetate 667 MG capsule Commonly known as:  PHOSLO Take 667 mg by mouth 2 (two) times daily with a meal.   carvedilol 25 MG tablet Commonly known as:  COREG Take 25 mg by mouth 2 (two) times daily.   clotrimazole-betamethasone cream Commonly known  as:  LOTRISONE Apply 1 application topically daily as needed (rash).   diphenoxylate-atropine 2.5-0.025 MG tablet Commonly known as:  LOMOTIL Take 1 tablet by mouth every 12 (twelve) hours as needed for diarrhea or loose stools.   gabapentin 100 MG capsule Commonly known as:  NEURONTIN Take 1 capsule (100 mg total) by mouth at bedtime.   heparin 1000 unit/mL Soln injection 1 mL (1,000 Units total) by Dialysis route as needed (in dialysis).   heparin 1000 unit/mL Soln injection 1.6 mLs (1,600 Units total) by Dialysis route as needed (in dialysis).   heparin 1000 unit/mL Soln injection 3 mLs (3,000 Units total) by Dialysis route as needed (in dialysis). Start taking on:  December 31, 2018   hydrALAZINE 25 MG tablet Commonly known as:  APRESOLINE Take 25 mg by mouth 2 (two) times daily.   lidocaine-prilocaine cream Commonly known as:  EMLA Apply 1 application topically daily as needed  (dialysis).   multivitamin Tabs tablet Take 1 tablet by mouth at bedtime.   oxyCODONE 5 MG immediate release tablet Commonly known as:  Oxy IR/ROXICODONE Take 1 tablet (5 mg total) by mouth every 12 (twelve) hours as needed for moderate pain or severe pain.   pantoprazole 40 MG tablet Commonly known as:  PROTONIX Take 40 mg by mouth every evening.   pentafluoroprop-tetrafluoroeth Aero Commonly known as:  GEBAUERS Apply 1 application topically as needed (topical anesthesia for hemodialysis).   pentoxifylline 400 MG CR tablet Commonly known as:  TRENTAL Take 1 tablet (400 mg total) by mouth daily with supper.   polyethylene glycol packet Commonly known as:  MIRALAX / GLYCOLAX Take 17 g by mouth daily as needed for mild constipation.   ProAir HFA 108 (90 Base) MCG/ACT inhaler Generic drug:  albuterol Inhale 2 puffs into the lungs every 6 (six) hours as needed for wheezing or shortness of breath.   saccharomyces boulardii 250 MG capsule Commonly known as:  FLORASTOR Take 1 capsule (250 mg total) by mouth 2 (two) times daily. What changed:  when to take this   ticagrelor 90 MG Tabs tablet Commonly known as:  BRILINTA Take 90 mg by mouth 2 (two) times daily.   Vancomycin 750-5 MG/150ML-% Soln Commonly known as:  VANCOCIN Inject 150 mLs (750 mg total) into the vein Every Tuesday,Thursday,and Saturday with dialysis for 4 days.            Durable Medical Equipment  (From admission, onward)         Start     Ordered   12/26/18 1515  For home use only DME lightweight manual wheelchair with seat cushion  Once    Comments:  Patient suffers from limited mobility, recent toe amputation which impairs their ability to perform daily activities like bathing and toileting in the home.  A walker will not resolve  issue with performing activities of daily living. A wheelchair will allow patient to safely perform daily activities. Patient is not able to propel themselves in the home  using a standard weight wheelchair due to endurance and general weakness. Patient can self propel in the lightweight wheelchair.  Accessories: elevating leg rests (ELRs), wheel locks, extensions and anti-tippers.   12/26/18 1516        Discharge Instructions: Please refer to Patient Instructions section of EMR for full details.  Patient was counseled important signs and symptoms that should prompt return to medical care, changes in medications, dietary instructions, activity restrictions, and follow up appointments.   Follow-Up Appointments:  Contact  information for follow-up providers    Newt Minion, MD In 1 week.   Specialty:  Orthopedic Surgery Contact information: [place] [address] [phone]        Serafina Mitchell, MD Follow up in 3 week(s).   Specialties:  Vascular Surgery, Cardiology Why:  office will call Contact information: [place] [address] [phone]        Martinique, Sarah T, MD. Go in 1 week(s).   Specialty:  Family Medicine Contact information: [place]. [place] [address] [phone]            Contact information for after-discharge care    Destination    [facility] Preferred SNF .   Service:  Skilled Nursing Contact information: [address] [place]                 Bonnita Hollow, MD 12/30/2018, 4:00 PM PGY-2, [place]

## 2018-12-22 NOTE — Progress Notes (Signed)
[place] for Heparin Indication: h/o DVT and CVA   Allergies  Allergen Reactions  . Crestor [Rosuvastatin Calcium] Other (See Comments)    Leg pain    Patient Measurements: Height: 5\' 8"  (172.7 cm) Weight: 178 lb 1.6 oz (80.8 kg) IBW/kg (Calculated) : 63.9 Heparin Dosing Weight: 80kg  Vital Signs: Temp: 98.3 F (36.8 C) (03/27 2254) Temp Source: Oral (03/27 2254) BP: 130/48 (03/27 2254) Pulse Rate: 80 (03/27 2254)  Labs: Recent Labs    12/21/18 1732 12/22/18 0313 12/22/18 2236  HGB 12.4 11.8*  --   HCT 38.6 37.5  --   PLT 602* 563*  --   APTT  --  51*  --   LABPROT 18.4* 19.9*  --   INR 1.6* 1.7*  --   HEPARINUNFRC  --   --  0.27*  CREATININE 1.83* 2.60*  --     Estimated Creatinine Clearance: 24.7 mL/min (A) (by C-G formula based on SCr of 2.6 mg/dL (H)).   Assessment: 63 y.o. female with h/o DVT and CVA s/p R great toe amputation, Coumadin on hold, for heparin  Goal of Therapy:  Heparin level 0.3-0.7 units/ml  Monitor platelets by anticoagulation protocol: Yes   Plan:  Increase Heparin 1350 units/hr Follow-up am labs.   Caryl Pina 12/22/2018,11:03 PM

## 2018-12-22 NOTE — Transfer of Care (Signed)
Immediate Anesthesia Transfer of Care Note  Patient: Kristen Berry  Procedure(s) Performed: Right foot 1st ray amputation (Right )  Patient Location: PACU  Anesthesia Type:General  Level of Consciousness: awake, alert  and oriented  Airway & Oxygen Therapy: Patient Spontanous Breathing and Patient connected to nasal cannula oxygen  Post-op Assessment: Report given to RN, Post -op Vital signs reviewed and stable and Patient moving all extremities  Post vital signs: Reviewed and stable  Last Vitals:  Vitals Value Taken Time  BP 126/49 12/22/2018  2:57 PM  Temp    Pulse 92 12/22/2018  3:00 PM  Resp 24 12/22/2018  3:00 PM  SpO2 96 % 12/22/2018  3:00 PM  Vitals shown include unvalidated device data.  Last Pain:  Vitals:   12/22/18 1327  TempSrc:   PainSc: 0-No pain         Complications: No apparent anesthesia complications

## 2018-12-22 NOTE — Progress Notes (Signed)
OT Cancellation Note  Patient Details Name: Kristen Berry MRN: [id number] DOB: April 17, 1956   Cancelled Treatment:    Reason Eval/Treat Not Completed: OT eval received, chart reviewed. OT orders for after surgery. Will continue to follow and initiate OT eval s/p surgery.  Darrol Jump OTR/L Acute Rehab Services [phone] 12/22/2018, 7:53 AM

## 2018-12-22 NOTE — Progress Notes (Signed)
ANTICOAGULATION CONSULT NOTE - Initial Consult  Pharmacy Consult for Heparin/Coumadin Indication: h/o DVT and CVA   Allergies  Allergen Reactions  . Crestor [Rosuvastatin Calcium] Other (See Comments)    Leg pain    Patient Measurements: Height: 5\' 8"  (172.7 cm) Weight: 178 lb 1.6 oz (80.8 kg) IBW/kg (Calculated) : 63.9 Heparin Dosing Weight: 80kg  Vital Signs: Temp: 98.4 F (36.9 C) (03/27 1531) Temp Source: Oral (03/27 1531) BP: 116/53 (03/27 1531) Pulse Rate: 96 (03/27 1531)  Labs: Recent Labs    12/21/18 1732 12/22/18 0313  HGB 12.4 11.8*  HCT 38.6 37.5  PLT 602* 563*  APTT  --  51*  LABPROT 18.4* 19.9*  INR 1.6* 1.7*  CREATININE 1.83* 2.60*    Estimated Creatinine Clearance: 24.7 mL/min (A) (by C-G formula based on SCr of 2.6 mg/dL (H)).   Medical History: Past Medical History:  Diagnosis Date  . Anemia   . Arthritis    "hands, knees" (10/10/2017)  . Asthma   . CHF (congestive heart failure) ([place])   . Chronic kidney disease (CKD), stage IV (severe) ([place])    Dialysis T/ Th/ Sat  . Coronary artery disease   . H/O Clostridium difficile infection   . High cholesterol   . History of kidney stones   . Hypertension   . PVD (peripheral vascular disease) ([place])    "LLE; will have OR" (10/10/2017)  . Sleep apnea    "never given mask" (10/10/2017)  . Stroke [facility])    mild stroke mid April - 2019  . Type II diabetes mellitus ([place])    "no RX anymore" (10/10/2017)    Assessment:  Anticoag: SQ Hep x 1 on 3/26 PM. CBC WNL Start heparin bridge>warfarin for h/o DVT and CVA. Hgb 11.8. Plts WNL. INR 1.7 today. - PTA dose: warfarin 4mg  TThSS/5mg  MWF. INR 1.6 on admit.   Goal of Therapy:  Heparin level 0.3-0.7 units/ml  INR 2-3 Monitor platelets by anticoagulation protocol: Yes   Plan:  Start IV heparin (no bolus with amputation 3/27) at 1250 units/hr. Check heparin level in 6 hrs. Coumadin 7.5mg  po x 1 tonight Daily HL, CBC, and INR   Quida Glasser S.  Alford Highland, PharmD, BCPS Clinical Staff Pharmacist Eilene Ghazi Stillinger 12/22/2018,4:23 PM

## 2018-12-22 NOTE — Anesthesia Procedure Notes (Signed)
Procedure Name: Intubation Date/Time: 12/22/2018 2:30 PM Performed by: Leonor Liv, CRNA Pre-anesthesia Checklist: Patient identified, Emergency Drugs available, Suction available and Patient being monitored Patient Re-evaluated:Patient Re-evaluated prior to induction Oxygen Delivery Method: Circle System Utilized Preoxygenation: Pre-oxygenation with 100% oxygen Induction Type: IV induction and Rapid sequence Laryngoscope Size: Mac and 3 Grade View: Grade I Tube type: Oral Tube size: 7.0 mm Number of attempts: 1 Airway Equipment and Method: Stylet and Oral airway Placement Confirmation: ETT inserted through vocal cords under direct vision,  positive ETCO2 and breath sounds checked- equal and bilateral Tube secured with: Tape Dental Injury: Teeth and Oropharynx as per pre-operative assessment

## 2018-12-22 NOTE — Progress Notes (Signed)
PT Cancellation Note  Patient Details Name: Kristen Berry MRN: [id number] DOB: November 01, 1955   Cancelled Treatment:    Reason Eval/Treat Not Completed: PT eval received, chart reviewed. PT orders for after surgery. Will continue to follow and initiate PT eval s/p surgery.    [name] 12/22/2018, 7:11 AM   [name], PT, DPT Acute Rehabilitation Services Pager: [phone] Office: [phone]

## 2018-12-22 NOTE — Consult Note (Signed)
ORTHOPAEDIC CONSULTATION  REQUESTING PHYSICIAN: Leeanne Rio, MD  Chief Complaint: Gangrene purulent abscess and osteomyelitis right great toe.  HPI: Kristen Berry is a 63 y.o. female who presents with abscess osteomyelitis and gangrene of the right great toe.  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  Past Medical History:  Diagnosis Date  . Anemia   . Arthritis    "hands, knees" (10/10/2017)  . Asthma   . CHF (congestive heart failure) ([place])   . Chronic kidney disease (CKD), stage IV (severe) ([place])    Dialysis T/ Th/ Sat  . Coronary artery disease   . H/O Clostridium difficile infection   . High cholesterol   . History of kidney stones   . Hypertension   . PVD (peripheral vascular disease) ([place])    "LLE; will have OR" (10/10/2017)  . Sleep apnea    "never given mask" (10/10/2017)  . Stroke [facility])    mild stroke mid April - 2019  . Type II diabetes mellitus ([place])    "no RX anymore" (10/10/2017)   Past Surgical History:  Procedure Laterality Date  . AMPUTATION Left 11/10/2017   Procedure: AMPUTATION DIGIT SECOND TOE LEFT FOOT;  Surgeon: Waynetta Sandy, MD;  Location: [place];  Service: Vascular;  Laterality: Left;  . AMPUTATION Left 01/24/2018   Procedure: AMPUTATION THIRD TOE LEFT FOOT;  Surgeon: Waynetta Sandy, MD;  Location: [place];  Service: Vascular;  Laterality: Left;  . AORTOGRAM N/A 11/03/2017   Procedure: Ultrasound Guided Cannulation Right Common Femoral Artery;  Aortagram with Left Lower Extremity Arteriogram; Attempted Treatment Left Superficial Femoral Artery; Percutaneous Closure Right Common Femoral Arteriotomy with Proglide Device;  Surgeon: Waynetta Sandy, MD;  Location: [facility] OR;  Service: Vascular;  Laterality: N/A;  . AV FISTULA PLACEMENT Left 11/10/2017   Procedure: ARTERIOVENOUS (AV) FISTULA CREATION LEFT  UPPER ARM;  Surgeon: Waynetta Sandy, MD;  Location: [place];  Service: Vascular;  Laterality: Left;  . HERNIA REPAIR  1950s  . INSERTION OF DIALYSIS CATHETER Right 11/10/2017   Procedure: INSERTION OF TUNNELED DIALYSIS CATHETER RIGHT INTERNAL JUGULAR PLACEMENT;  Surgeon: Waynetta Sandy, MD;  Location: [place];  Service: Vascular;  Laterality: Right;  . IR FLUORO GUIDE CV LINE RIGHT  10/19/2017  . IR US GUIDE VASC ACCESS RIGHT  10/19/2017  . LEFT HEART CATH AND CORONARY ANGIOGRAPHY N/A 11/01/2017   Procedure: LEFT HEART CATH AND CORONARY ANGIOGRAPHY;  Surgeon: Belva Crome, MD;  Location: [place] CV LAB;  Service: Cardiovascular;  Laterality: N/A;  . PERIPHERAL VASCULAR INTERVENTION Left 11/07/2017   Procedure: PERIPHERAL VASCULAR INTERVENTION;  Surgeon: Waynetta Sandy, MD;  Location: [place] CV LAB;  Service: Cardiovascular;  Laterality: Left;  left SFA  . SHOULDER OPEN ROTATOR CUFF REPAIR Left   . TUBAL LIGATION    . ULTRASOUND GUIDANCE FOR VASCULAR ACCESS  11/01/2017   Procedure: Ultrasound Guidance For Vascular Access;  Surgeon: Belva Crome, MD;  Location: [place] CV LAB;  Service: Cardiovascular;;   Social History   Socioeconomic History  . Marital status: Divorced    Spouse name: Not on file  . Number of children: 3  . Years of education: Not on file  . Highest education level: Not on file  Occupational History  . Occupation: disabled  Social Needs  . Financial resource strain: Not on file  . Food  insecurity:    Worry: Not on file    Inability: Not on file  . Transportation needs:    Medical: Not on file    Non-medical: Not on file  Tobacco Use  . Smoking status: Never Smoker  . Smokeless tobacco: Never Used  Substance and Sexual Activity  . Alcohol use: No  . Drug use: No  . Sexual activity: Never  Lifestyle  . Physical activity:    Days per week: Not on file    Minutes per session: Not on file  . Stress: Not on file   Relationships  . Social connections:    Talks on phone: Not on file    Gets together: Not on file    Attends religious service: Not on file    Active member of club or organization: Not on file    Attends meetings of clubs or organizations: Not on file    Relationship status: Not on file  Other Topics Concern  . Not on file  Social History Narrative  . Not on file   Family History  Problem Relation Age of Onset  . Hypertension Father   . Heart failure Maternal Grandmother   . Heart failure Maternal Grandfather    - negative except otherwise stated in the family history section Allergies  Allergen Reactions  . Crestor [Rosuvastatin Calcium] Other (See Comments)    Leg pain   Prior to Admission medications   Medication Sig Start Date End Date Taking? Authorizing Provider  acetaminophen (TYLENOL) 500 MG tablet Take 1,500 mg by mouth daily as needed for mild pain or headache.    Yes [provider]  albuterol (PROAIR HFA) 108 (90 Base) MCG/ACT inhaler Inhale 2 puffs into the lungs every 6 (six) hours as needed for wheezing or shortness of breath.   Yes [provider]  amLODipine (NORVASC) 5 MG tablet Take 5 mg by mouth every evening.    Yes [provider]  atorvastatin (LIPITOR) 20 MG tablet Take 1 tablet (20 mg total) by mouth daily at 6 PM. 11/16/17  Yes Domenic Polite, MD  calcium acetate (PHOSLO) 667 MG capsule Take 667 mg by mouth 2 (two) times daily with a meal.  10/27/18  Yes [provider]  carvedilol (COREG) 25 MG tablet Take 25 mg by mouth 2 (two) times daily. 08/26/18  Yes [provider]  clotrimazole-betamethasone (LOTRISONE) cream Apply 1 application topically daily as needed (rash).  01/19/18  Yes [provider]  diphenoxylate-atropine (LOMOTIL) 2.5-0.025 MG tablet Take 1 tablet by mouth every 12 (twelve) hours as needed for diarrhea or loose stools.    Yes [provider]  hydrALAZINE (APRESOLINE) 25 MG  tablet Take 25 mg by mouth 2 (two) times daily.  01/25/18  Yes Rhyne, Hulen Shouts, PA-C  lidocaine-prilocaine (EMLA) cream Apply 1 application topically daily as needed (dialysis).  09/28/18  Yes [provider]  nitroGLYCERIN (NITROSTAT) 0.4 MG SL tablet Place 0.4 mg under the tongue every 5 (five) minutes as needed for chest pain.   Yes [provider]  pantoprazole (PROTONIX) 40 MG tablet Take 40 mg by mouth every evening.    Yes [provider]  saccharomyces boulardii (FLORASTOR) 250 MG capsule Take 1 capsule (250 mg total) by mouth 2 (two) times daily. Patient taking differently: Take 250 mg by mouth every evening.  01/12/18  Yes Caren Griffins, MD  ticagrelor (BRILINTA) 90 MG TABS tablet Take 90 mg by mouth 2 (two) times daily.  Yes [provider]  warfarin (COUMADIN) 4 MG tablet Take 4 mg by mouth See admin instructions. Lydia Guiles Thurs Sat   Yes [provider]  warfarin (COUMADIN) 5 MG tablet Take 1 tablet (5 mg total) by mouth daily. Check INR at [facility] on Saturday Patient taking differently: Take 5 mg by mouth See admin instructions. Molli Knock Wed Fri 11/16/17  Yes Domenic Polite, MD   Dg Foot Complete Right  Result Date: 12/21/2018 CLINICAL DATA:  Pt presents with necrotic R big toe, reports 1 week h/o of redness, drainage with foul odor. Pt states the pain radiates from her big toe up into her lower leg, right foot and lower leg has associated swelling and redness. Pt denies any injury to right foot. Hx of diabetes and PVD. EXAM: RIGHT FOOT COMPLETE - 3+ VIEW COMPARISON:  None. FINDINGS: There is resorption of a portion of the distal tuft, along its plantar lateral margin, and along the lateral aspect of the proximal to mid aspect of the great toe distal phalanx, findings consistent with osteomyelitis. There are no other areas of bone resorption to suggest additional osteomyelitis. No fracture.  Joints are normally aligned. There is soft tissue  swelling of the great toe without soft tissue air. Large dorsal and plantar calcaneal spurs. IMPRESSION: 1. Osteomyelitis of the distal phalanx of the right great toe involving the distal tuft and lateral aspect of the proximal to mid phalanx. 2. There is surrounding soft tissue swelling, without soft tissue air. 3. No fracture, dislocation or other evidence of osteomyelitis. Electronically Signed   By: Lajean Manes M.D.   On: 12/21/2018 18:01   - pertinent xrays, CT, MRI studies were reviewed and independently interpreted  Positive ROS: All other systems have been reviewed and were otherwise negative with the exception of those mentioned in the HPI and as above.  Physical Exam: General: Alert, no acute distress Psychiatric: Patient is competent for consent with normal mood and affect Lymphatic: No axillary or cervical lymphadenopathy Cardiovascular: No pedal edema Respiratory: No cyanosis, no use of accessory musculature GI: No organomegaly, abdomen is soft and non-tender    Images:  @ENCIMAGES @  Labs:  Lab Results  Component Value Date   HGBA1C 5.5 12/22/2018   HGBA1C 5.5 10/13/2017   HGBA1C 6.6 (H) 06/28/2013   ESRSEDRATE 100 (H) 12/21/2018   ESRSEDRATE 127 (H) 10/27/2017   CRP 18.8 (H) 10/27/2017   REPTSTATUS 10/24/2017 FINAL 10/19/2017   CULT NO GROWTH 5 DAYS 10/19/2017    Lab Results  Component Value Date   ALBUMIN 3.4 (L) 12/21/2018   ALBUMIN 2.6 (L) 01/24/2018   ALBUMIN 2.9 (L) 01/24/2018    Neurologic: Patient does not have protective sensation bilateral lower extremities.   MUSCULOSKELETAL:   Skin: Examination patient has gangrenous changes with purulent draining abscess from the right great toe.  Radiographs show destructive bony changes consistent with osteomyelitis.  Patient does not have a palpable pulse.  Her ankle-brachial indices on the right were 57% with dampened monophasic flow left lower extremity ankle-brachial indices were 95%.  Assessment:  Abscess osteomyelitis gangrene right great toe  Plan: Amputation right first ray today, I have consulted [facility] for evaluation for revascularization  Thank you for the consult and the opportunity to see Ms. [place], MD [facility] [phone] 8:31 AM

## 2018-12-22 NOTE — Anesthesia Postprocedure Evaluation (Signed)
Anesthesia Post Note  Patient: Kristen Berry  Procedure(s) Performed: Right foot 1st ray amputation (Right )     Patient location during evaluation: PACU Anesthesia Type: General Level of consciousness: awake and alert Pain management: pain level controlled Vital Signs Assessment: post-procedure vital signs reviewed and stable Respiratory status: spontaneous breathing, nonlabored ventilation, respiratory function stable and patient connected to nasal cannula oxygen Cardiovascular status: blood pressure returned to baseline and stable Postop Assessment: no apparent nausea or vomiting Anesthetic complications: no    Last Vitals:  Vitals:   12/22/18 1515 12/22/18 1531  BP:  (!) 116/53  Pulse:  96  Resp:  18  Temp: 36.6 C 36.9 C  SpO2:  94%    Last Pain:  Vitals:   12/22/18 1540  TempSrc:   PainSc: 6                  Tiajuana Amass

## 2018-12-22 NOTE — Op Note (Signed)
12/22/2018  3:17 PM  PATIENT:  Kristen Berry    PRE-OPERATIVE DIAGNOSIS: Gangrene with Osteomyolitis and abcess Right great toe.  POST-OPERATIVE DIAGNOSIS:  Same  PROCEDURE:  Right foot 1st ray amputation Local tissue rearrangement for wound closure 7 x 3 cm. Application of Praveena 13 cm dressing.  SURGEON:  Newt Minion, MD  PHYSICIAN ASSISTANT:None ANESTHESIA:   General  PREOPERATIVE INDICATIONS:  [name] is a  63 y.o. female with a diagnosis of Osteomyolitis and abcess Right great toe. who failed conservative measures and elected for surgical management.    The risks benefits and alternatives were discussed with the patient preoperatively including but not limited to the risks of infection, bleeding, nerve injury, cardiopulmonary complications, the need for revision surgery, among others, and the patient was willing to proceed.  OPERATIVE IMPLANTS: Praveena 13 cm.  @ENCIMAGES @  OPERATIVE FINDINGS: Patient does have ischemic changes there was a small ischemic ulcer on the second toe as well.  Minimal petechial bleeding.  Trental was ordered to help with microcirculation.  I have consulted vascular vein surgery for evaluation for revascularization to the right lower extremity.  OPERATIVE PROCEDURE: Patient was brought the operating room and underwent a general anesthetic.  After adequate levels anesthesia were obtained patient's right lower extremity was prepped using ChloraPrep draped into a sterile field a timeout was called.  A racquet incision was made around the ulcerative necrotic toe with the toe and gangrenous soft tissue resected in one block of tissue through the base of the first metatarsal.  Electrocautery was used for hemostasis the wound was irrigated with normal saline.  There was minimal petechial bleeding.  Wound edges were resected back to viable tissue.  Local tissue rearrangement was used to close the wound 3 x 7 cm.  There was no tension on the skin.  A  Praveena wound VAC was applied this had a good suction fit.  Patient was taken the PACU in stable condition.   DISCHARGE PLANNING:  Antibiotic duration: Continue antibiotics 24 hours postoperatively  Weightbearing: Nonweightbearing on the right  Pain medication: Opioid pathway ordered  Dressing care/ Wound VAC: Continue wound VAC for 1 week  Ambulatory devices: Walker.  Discharge to: Anticipate discharge to home if safe with therapy.  Follow-up: In the office 1 week post operative.

## 2018-12-22 NOTE — Progress Notes (Signed)
Orthopedic Tech Progress Note Patient Details:  Kristen Berry [facility] 29-Sep-1955 [id number]  Ortho Devices Type of Ortho Device: Postop shoe/boot Ortho Device/Splint Location: Right Foot Ortho Device/Splint Interventions: Ordered, Application, Adjustment   Post Interventions Patient Tolerated: Well Instructions Provided: Adjustment of device, Care of device   Kristen Berry Kristen Berry 12/22/2018, 3:32 PM

## 2018-12-22 NOTE — Anesthesia Preprocedure Evaluation (Addendum)
Anesthesia Evaluation  Patient identified by MRN, date of birth, ID band Patient awake    Reviewed: Allergy & Precautions, NPO status , Patient's Chart, lab work & pertinent test results, reviewed documented beta blocker date and time   History of Anesthesia Complications Negative for: history of anesthetic complications  Airway Mallampati: II   Neck ROM: Full    Dental   Pulmonary asthma , sleep apnea ,    breath sounds clear to auscultation       Cardiovascular hypertension, Pt. on medications and Pt. on home beta blockers + CAD, + Peripheral Vascular Disease and +CHF   Rhythm:Regular Rate:Normal     Neuro/Psych CVA negative psych ROS   GI/Hepatic Neg liver ROS, GERD  ,  Endo/Other  negative endocrine ROSdiabetes  Renal/GU ESRF and DialysisRenal disease  negative genitourinary   Musculoskeletal negative musculoskeletal ROS (+)   Abdominal   Peds  Hematology negative hematology ROS (+)   Anesthesia Other Findings asthma, CVA, HTN, CAD, CHF, PVD, DMII, ESRD on HD  TTE 10/17/17: EF 35-40%, mild MS, mod MR LHC 11/01/17: Multivessel diffuse coronary disease with regions of significant obstruction within diffuse pattern of atherosclerosis as noted above.  There is no high-grade obstruction with the appearance of instability.  She has borderline significant distal left main disease. Initial recommendation is aggressive medical therapy.  There is no ideal targets for PCI.  The diffuse nature of disease would make coronary bypass surgery difficult but I believe it is still possible.  I do not believe she is yet at a point where CABG should be recommended.  Reproductive/Obstetrics                            Anesthesia Physical Anesthesia Plan  ASA: III  Anesthesia Plan: General   Post-op Pain Management:    Induction: Intravenous  PONV Risk Score and Plan: 2 and Treatment may vary due to age or  medical condition, Dexamethasone and Ondansetron  Airway Management Planned: Simple Face Mask and Oral ETT  Additional Equipment: None  Intra-op Plan:   Post-operative Plan: Extubation in OR  Informed Consent: I have reviewed the patients History and Physical, chart, labs and discussed the procedure including the risks, benefits and alternatives for the proposed anesthesia with the patient or authorized representative who has indicated his/her understanding and acceptance.     Dental advisory given  Plan Discussed with: CRNA  Anesthesia Plan Comments:        Anesthesia Quick Evaluation

## 2018-12-22 NOTE — Consult Note (Addendum)
[facility] Renal Consultation Note    Indication for Consultation:  Management of ESRD/hemodialysis; anemia, hypertension/volume and secondary hyperparathyroidism  HPI: Kristen Berry is a 63 y.o. female with ESRD secondary to cardiorenal syndrome on HD for slightly more than a year on TTS dialysis in [place] presented to the ED yesterday after dialysis. PMHx is significant for amputation of left prior left 2nd and 3rd toe amputations, hx provoked DVT failed AVF that may need converstion to [place], hx of bed bugs 10/2018 and new right great toe ulcer with foul odor and bloody drainage noted 12/14/18 at dialysis. Wound was cultured at that time and she was referred to [facility]. Wound cultures from 3/19 were + for MRSA and serratia - she was started on Vanc and Fortaz 3/24 and again 3/26. BC x 2 3/19 was negative. She was seen again on dialysis and sent to the ED for evaluation after HD yesterday.  She was afebrile upon arrival with stable VS Labs in the ED yesterday showed WBC 12 hgb 12.4 post HD K 3.7 post HD She was MRSA PCR negative. X-ray showed right great toe osteo  At present she has no N, V, D, fever, chills, SOB, cough.  She states her living space has been cleared of bed bugs by Ileene Patrick but her dialysis unit keeps her on bed bug precautions. She states the excoriations on her skin are due to chronic itching and scratching. She was due to have a f'gram last month for access planning but was cancelled after her arrival due to an emergency.  Past Medical History:  Diagnosis Date  . Anemia   . Arthritis    "hands, knees" (10/10/2017)  . Asthma   . CHF (congestive heart failure) ([place])   . Chronic kidney disease (CKD), stage IV (severe) ([place])    Dialysis T/ Th/ Sat  . Coronary artery disease   . H/O Clostridium difficile infection   . High cholesterol   . History of kidney stones   . Hypertension   . PVD (peripheral vascular disease) ([place])    "LLE; will have OR" (10/10/2017)  . Sleep  apnea    "never given mask" (10/10/2017)  . Stroke [facility])    mild stroke mid April - 2019  . Type II diabetes mellitus ([place])    "no RX anymore" (10/10/2017)   Past Surgical History:  Procedure Laterality Date  . AMPUTATION Left 11/10/2017   Procedure: AMPUTATION DIGIT SECOND TOE LEFT FOOT;  Surgeon: Waynetta Sandy, MD;  Location: [place];  Service: Vascular;  Laterality: Left;  . AMPUTATION Left 01/24/2018   Procedure: AMPUTATION THIRD TOE LEFT FOOT;  Surgeon: Waynetta Sandy, MD;  Location: [place];  Service: Vascular;  Laterality: Left;  . AORTOGRAM N/A 11/03/2017   Procedure: Ultrasound Guided Cannulation Right Common Femoral Artery;  Aortagram with Left Lower Extremity Arteriogram; Attempted Treatment Left Superficial Femoral Artery; Percutaneous Closure Right Common Femoral Arteriotomy with Proglide Device;  Surgeon: Waynetta Sandy, MD;  Location: [facility] OR;  Service: Vascular;  Laterality: N/A;  . AV FISTULA PLACEMENT Left 11/10/2017   Procedure: ARTERIOVENOUS (AV) FISTULA CREATION LEFT UPPER ARM;  Surgeon: Waynetta Sandy, MD;  Location: [place];  Service: Vascular;  Laterality: Left;  . HERNIA REPAIR  1950s  . INSERTION OF DIALYSIS CATHETER Right 11/10/2017   Procedure: INSERTION OF TUNNELED DIALYSIS CATHETER RIGHT INTERNAL JUGULAR PLACEMENT;  Surgeon: Waynetta Sandy, MD;  Location: [place];  Service: Vascular;  Laterality: Right;  . IR FLUORO  GUIDE CV LINE RIGHT  10/19/2017  . IR US GUIDE VASC ACCESS RIGHT  10/19/2017  . LEFT HEART CATH AND CORONARY ANGIOGRAPHY N/A 11/01/2017   Procedure: LEFT HEART CATH AND CORONARY ANGIOGRAPHY;  Surgeon: Belva Crome, MD;  Location: [place] CV LAB;  Service: Cardiovascular;  Laterality: N/A;  . PERIPHERAL VASCULAR INTERVENTION Left 11/07/2017   Procedure: PERIPHERAL VASCULAR INTERVENTION;  Surgeon: Waynetta Sandy, MD;  Location: [place] CV LAB;  Service: Cardiovascular;  Laterality: Left;  left SFA   . SHOULDER OPEN ROTATOR CUFF REPAIR Left   . TUBAL LIGATION    . ULTRASOUND GUIDANCE FOR VASCULAR ACCESS  11/01/2017   Procedure: Ultrasound Guidance For Vascular Access;  Surgeon: Belva Crome, MD;  Location: [place] CV LAB;  Service: Cardiovascular;;   Family History  Problem Relation Age of Onset  . Hypertension Father   . Heart failure Maternal Grandmother   . Heart failure Maternal Grandfather    Social History:  reports that she has never smoked. She has never used smokeless tobacco. She reports that she does not drink alcohol or use drugs. Allergies  Allergen Reactions  . Crestor [Rosuvastatin Calcium] Other (See Comments)    Leg pain   Prior to Admission medications   Medication Sig Start Date End Date Taking? Authorizing Provider  acetaminophen (TYLENOL) 500 MG tablet Take 1,500 mg by mouth daily as needed for mild pain or headache.    Yes [provider]  albuterol (PROAIR HFA) 108 (90 Base) MCG/ACT inhaler Inhale 2 puffs into the lungs every 6 (six) hours as needed for wheezing or shortness of breath.   Yes [provider]  amLODipine (NORVASC) 5 MG tablet Take 5 mg by mouth every evening.    Yes [provider]  atorvastatin (LIPITOR) 20 MG tablet Take 1 tablet (20 mg total) by mouth daily at 6 PM. 11/16/17  Yes Domenic Polite, MD  calcium acetate (PHOSLO) 667 MG capsule Take 667 mg by mouth 2 (two) times daily with a meal.  10/27/18  Yes [provider]  carvedilol (COREG) 25 MG tablet Take 25 mg by mouth 2 (two) times daily. 08/26/18  Yes [provider]  clotrimazole-betamethasone (LOTRISONE) cream Apply 1 application topically daily as needed (rash).  01/19/18  Yes [provider]  diphenoxylate-atropine (LOMOTIL) 2.5-0.025 MG tablet Take 1 tablet by mouth every 12 (twelve) hours as needed for diarrhea or loose stools.    Yes [provider]  hydrALAZINE (APRESOLINE) 25 MG tablet Take 25 mg by mouth 2 (two)  times daily.  01/25/18  Yes Rhyne, Hulen Shouts, PA-C  lidocaine-prilocaine (EMLA) cream Apply 1 application topically daily as needed (dialysis).  09/28/18  Yes [provider]  nitroGLYCERIN (NITROSTAT) 0.4 MG SL tablet Place 0.4 mg under the tongue every 5 (five) minutes as needed for chest pain.   Yes [provider]  pantoprazole (PROTONIX) 40 MG tablet Take 40 mg by mouth every evening.    Yes [provider]  saccharomyces boulardii (FLORASTOR) 250 MG capsule Take 1 capsule (250 mg total) by mouth 2 (two) times daily. Patient taking differently: Take 250 mg by mouth every evening.  01/12/18  Yes Caren Griffins, MD  ticagrelor (BRILINTA) 90 MG TABS tablet Take 90 mg by mouth 2 (two) times daily.    Yes [provider]  warfarin (COUMADIN) 4 MG tablet Take 4 mg by mouth See admin instructions. Lydia Guiles Thurs Sat   Yes [provider]  warfarin (COUMADIN) 5 MG tablet Take 1 tablet (5 mg total) by mouth daily. Check INR at Next Dialysis on Saturday Patient taking differently: Take 5 mg by mouth See admin instructions. Molli Knock Wed Fri 11/16/17  Yes Domenic Polite, MD   Current Facility-Administered Medications  Medication Dose Route Frequency Provider Last Rate Last Dose  . 0.9 %  sodium chloride infusion  250 mL Intravenous PRN Benay Pike, MD      . acetaminophen (TYLENOL) tablet 650 mg  650 mg Oral Q6H PRN Benay Pike, MD       Or  . acetaminophen (TYLENOL) suppository 650 mg  650 mg Rectal Q6H PRN Benay Pike, MD      . amLODipine (NORVASC) tablet 5 mg  5 mg Oral QPM Benay Pike, MD   5 mg at 12/21/18 2230  . atorvastatin (LIPITOR) tablet 20 mg  20 mg Oral q1800 Benay Pike, MD   20 mg at 12/21/18 2230  . calcium acetate (PHOSLO) capsule 667 mg  667 mg Oral BID WC Benay Pike, MD      . carvedilol (COREG) tablet 25 mg  25 mg Oral BID Benay Pike, MD   25 mg at 12/21/18 2230  . [START ON 12/23/2018] cefTAZidime (FORTAZ) 2 g in  sodium chloride 0.9 % 100 mL IVPB  2 g Intravenous Q T,Th,Sa-HD Robertson, Crystal S, RPH      . hydrALAZINE (APRESOLINE) tablet 25 mg  25 mg Oral BID Benay Pike, MD      . insulin aspart (novoLOG) injection 0-9 Units  0-9 Units Subcutaneous TID WC Benay Pike, MD      . polyethylene glycol (MIRALAX / GLYCOLAX) packet 17 g  17 g Oral Daily PRN Benay Pike, MD      . sodium chloride flush (NS) 0.9 % injection 3 mL  3 mL Intravenous Q12H Benay Pike, MD   3 mL at 12/21/18 2231  . sodium chloride flush (NS) 0.9 % injection 3 mL  3 mL Intravenous PRN Benay Pike, MD      . Derrill Memo ON 12/23/2018] vancomycin (VANCOCIN) 1,000 mg in sodium chloride 0.9 % 250 mL IVPB  1,000 mg Intravenous Q T,Th,Sa-HD Karren Cobble, [facility]       Labs: Basic Metabolic Panel: Recent Labs  Lab 12/21/18 1732 12/22/18 0313  NA 135 136  K 3.7 4.0  CL 98 99  CO2 25 24  GLUCOSE 90 101*  BUN 7* 13  CREATININE 1.83* 2.60*  CALCIUM 8.8* 9.2   Liver Function Tests: Recent Labs  Lab 12/21/18 1732  AST 16  ALT 10  ALKPHOS 108  BILITOT 0.5  PROT 8.1  ALBUMIN 3.4*   No results for input(s): LIPASE, AMYLASE in the last 168 hours. No results for input(s): AMMONIA in the last 168 hours. CBC: Recent Labs  Lab 12/21/18 1732 12/22/18 0313  WBC 12.0* 11.5*  NEUTROABS 8.6*  --   HGB 12.4 11.8*  HCT 38.6 37.5  MCV 101.6* 102.2*  PLT 602* 563*   Cardiac Enzymes: No results for input(s): CKTOTAL, CKMB, CKMBINDEX, TROPONINI in the last 168 hours. CBG: Recent Labs  Lab 12/21/18 2122 12/22/18 0746  GLUCAP 98 94   Iron Studies: No results for input(s): IRON, TIBC, TRANSFERRIN, FERRITIN in the last 72 hours. Studies/Results: Dg Foot Complete Right  Result Date: 12/21/2018 CLINICAL DATA:  Pt presents with necrotic R big toe, reports 1 week h/o of redness, drainage with  foul odor. Pt states the pain radiates from her big toe up into her lower leg, right foot and lower leg has associated  swelling and redness. Pt denies any injury to right foot. Hx of diabetes and PVD. EXAM: RIGHT FOOT COMPLETE - 3+ VIEW COMPARISON:  None. FINDINGS: There is resorption of a portion of the distal tuft, along its plantar lateral margin, and along the lateral aspect of the proximal to mid aspect of the great toe distal phalanx, findings consistent with osteomyelitis. There are no other areas of bone resorption to suggest additional osteomyelitis. No fracture.  Joints are normally aligned. There is soft tissue swelling of the great toe without soft tissue air. Large dorsal and plantar calcaneal spurs. IMPRESSION: 1. Osteomyelitis of the distal phalanx of the right great toe involving the distal tuft and lateral aspect of the proximal to mid phalanx. 2. There is surrounding soft tissue swelling, without soft tissue air. 3. No fracture, dislocation or other evidence of osteomyelitis. Electronically Signed   By: Lajean Manes M.D.   On: 12/21/2018 18:01    ROS: As per HPI otherwise negative.  Physical Exam: Vitals:   12/21/18 2120 12/22/18 0400 12/22/18 0643 12/22/18 0904  BP: (!) 148/60 (!) 156/76 132/61 (!) 144/61  Pulse: 89 (!) 103 96 (!) 103  Resp: 20 20 20 20   Temp: 98 F (36.7 C) 98 F (36.7 C) 98.2 F (36.8 C) 98.5 F (36.9 C)  TempSrc: Oral Oral Oral Oral  SpO2: 98% 98% 99% 99%  Weight: 80.8 kg     Height: 5\' 8"  (1.727 m)        General: WDWN NAD Head: NCAT sclera not icteric MMM Neck: Supple.  Lungs: CTA bilaterally without wheezes, rales, or rhonchi. Breathing is unlabored. Heart: RRR with S1 S2.  Abdomen: soft NT + BS Lower extremities: + LE edema right great toe erythema, drainage, cellulitis changes of foot and LE Neuro: A & O  X 3. Moves all extremities spontaneously. Psych:  Responds to questions appropriately with a normal affect. Skin: scattered excoriated lesions on arms/legs no drainage Dialysis Access: right IJ [facility]  Dialysis Orders: [place] 4 hr  TTS 3 K 2.25 Ca EDW 81.5  right IJ TDC heparin 3000 with 3000 mid treatment dose no ESA or Fe calctiriol 0.75 Recent labs: hgb 11.4  ipTH 313  Assessment/Plan: 1.  right great toe osteomyelitis with purulent abscess and mile cellilitis foot/LE - seen by Dr. Sharol Given - for ray amputation today - VVS to see for eval for revascularization 2.  ESRD -  TTS - HD tomorrow 3.  Hypertension/volume  - titrate volume with HD - prone to large IDWG 4.  Anemia  - hgb stable - no ESA 5.  Metabolic bone disease -  continue VDRA/phoslo 6.  Nutrition - renal carb mod diet/vits 7.  Lack of permanent dialysis access - seen 10/31/18 - needs f'gram before proceding - was planned for 2/11 but it was cancelled after waiting all day due to a vascular emergency - transportation is a huge barrier for her living in [facility]; it would be nice if we could do this while she is here.   8. Hx of bed bugs  9. Hx CVA/CAD 10. Hx DVT - on coumadin-  Myriam Jacobson, PA-C [facility] Beeper [phone] 12/22/2018, 9:52 AM   I have seen and examined this patient and agree with plan and assessment in the above note with renal recommendations/intervention highlighted.  Will continue with HD  on TTS schedule while she remains an inpatient. Broadus John A Nicholle Falzon,MD 12/22/2018 11:55 AM

## 2018-12-22 NOTE — Progress Notes (Signed)
Family Medicine Teaching Service Daily Progress Note Intern Pager: [phone]  Patient name: Kristen Berry Medical record number: [id number] Date of birth: 1956/07/17 Age: 63 y.o. Gender: female  Primary Care Provider: [place], Sarah T, MD Consultants: Nephrology, orthopedics Code Status: DNR  Pt Overview and Major Events to Date:  12/21/2018 - Admitted  Assessment and Plan: KIMBELY WHITEAKER is a 63 y.o. female presenting with osteomyelitis of the right great toe.[name] PMH is significant for osteomyelitis of the left toes s/p amputation, ESRD, diabetes type 2, hypertension, history of DVT, history of stroke, CHF, MGUS  Osteomyelitis of right first toe, unchanged, stable: 2 weeks of right foot pain, has necrosis and ulceration to the right great toe as well as medial aspect of Right 2nd toe. Has received antibiotics with her last 2 dialysis sessions, vancomycin and ceftaz.  Hx of amputation of left second and third toe by VVS.  Patient is afebrile, WBC 12.0>11.5, VSS.  X-ray of right foot shows osteomyelitis of distal phalanx of right great toe w/o fx or dislocation. INR 1.7, Hgb 5.5%. -Ortho consulted, appreciate recs. -F/u Wound Cx with Amputation -Plan for heparin bridge and restarting Brilinta after surgery -Abx with HD on Saturday (Ceftaz and Vanc) - will change pending Ortho recommendations -Daily weights/ I&O -Vitals per routine -AM EKG -PT/OT to assess  Right leg cellulitis, improved: patient has mild appearing cellulitis of right leg from foot to upper two thirds of her shin. No well-defined border, endorses tenderness to palpation, not significantly warmer to touch than left leg. Marked border around leg, will receive antibiotics with hemodialysis. Area outlined in ED -Vanco and ceftaz with HD - change pending Ortho recs -Monitor regression of rash.  ESRD on HD TThS, chronic, stable: patient received hemodialysis earlier today.  Creatinine is 1.83>2.6, GFR is 29.  Calcium 8.8, albumin  3.4. Other electrolytes normal. -Nephro consulted, appreciate recs -Renally dose medications -Continue PhosLo  Diabetes mellitus type 2: A1c 5.5% 12/22/2018. Diet controlled. -sSSI, CBGs q4 hours  HFrEF: January 2019 patient had echo that showed LVEF 35 to 40%.  Septal and apical akinesis, moderately dilated LV, mild LVH. -Continue HD. No other tx at this time.  CAD: left heart cath in February 2019 showed severe diffuse diabetic coronary disease, 70 to 80% stenosis of proximal LAD 75% obstruction of RCA -holding brilinta in setting of needing surgery -Plan to restart Brilinta and heparin bridge to warfarin after surgery  Mild asthma: since childhood. Hasn't used her albuterol in months.  No other medications. -Hold albuterol  HTN: BP 132/61. Takes amlodipine 5mg , Coreg 25mg  BID, hydralazine 25mg  BID at home. -Continue home meds  HLD: pt takes 20mg  lipitor at home. Last Lipid panel January 2019 with Trig 180, HDL 39, and LDL 109. -Continue Lipitor  MGUS: diagnosed 2017.  Patient not currently on any medication.  Seen by Dr. Alvy Bimler at [facility].  History of left leg DVT: pt takes warfarin 4mg  TTSS/5mg  MWF.  Patient has history of DVT in left leg. Unknown when she first started warfarin, has been at least 1 year.  Patient currently subtherapeutic with INR of 1.6.  -hold warfarin before surgery.  Giving 1 dose of subq heparin on admission.   -Restart warfarin after surgery, pharmacy to dose, may need to bridge with heparin  Hx of stroke -patient states she had a stroke April 2019.  Was treated at [facility] in [place]. Does not appear to have any residual deficits.  Patient takes Brilinta 90 mg twice  daily.  -Hold Brilinta. Restart after surgery  FEN/GI: NPO Pending surgery Prophylaxis: SubQ heparin.  Warfarin after surgery  Disposition: Discharge once  home  Subjective:  Patient seen resting in bed, very pleasant.  Understands her toe has to  be amputated.  Is nervous for the surgery because this is the first surgery she is going through without her mother here to pray with her, as her mother passed away about 4 months ago.  Objective: Temp:  [98 F (36.7 C)-98.3 F (36.8 C)] 98.2 F (36.8 C) (03/27 0643) Pulse Rate:  [89-108] 96 (03/27 0643) Resp:  [18-20] 20 (03/27 0643) BP: (132-156)/(48-76) 132/61 (03/27 0643) SpO2:  [98 %-100 %] 99 % (03/27 0643) Weight:  [80.8 kg-85 kg] 80.8 kg (03/26 2120) Physical Exam: General: No apparent distress, pleasant female Cardiovascular: Regular rate and rhythm, S1-S2 present, no murmurs, rubs, gallops Respiratory: CTA bilaterally, moving air well, no respiratory distress Abdomen: Bowel sounds auscultated in 4 quadrants, soft, nontender, no masses Extremities: Necrotic, foul-smelling right great toe with medial ulceration of second toe of right foot; second and third toes of left foot s/p amputation, 2+ DP pulses bilaterally  Laboratory: Recent Labs  Lab 12/21/18 1732 12/22/18 0313  WBC 12.0* 11.5*  HGB 12.4 11.8*  HCT 38.6 37.5  PLT 602* 563*   Recent Labs  Lab 12/21/18 1732 12/22/18 0313  NA 135 136  K 3.7 4.0  CL 98 99  CO2 25 24  BUN 7* 13  CREATININE 1.83* 2.60*  CALCIUM 8.8* 9.2  PROT 8.1  --   BILITOT 0.5  --   ALKPHOS 108  --   ALT 10  --   AST 16  --   GLUCOSE 90 101*   Imaging/Diagnostic Tests: Dg Foot Complete Right  Result Date: 12/21/2018 CLINICAL DATA:  Pt presents with necrotic R big toe, reports 1 week h/o of redness, drainage with foul odor. Pt states the pain radiates from her big toe up into her lower leg, right foot and lower leg has associated swelling and redness. Pt denies any injury to right foot. Hx of diabetes and PVD. EXAM: RIGHT FOOT COMPLETE - 3+ VIEW COMPARISON:  None. FINDINGS: There is resorption of a portion of the distal tuft, along its plantar lateral margin, and along the lateral aspect of the proximal to mid aspect of the great toe  distal phalanx, findings consistent with osteomyelitis. There are no other areas of bone resorption to suggest additional osteomyelitis. No fracture.  Joints are normally aligned. There is soft tissue swelling of the great toe without soft tissue air. Large dorsal and plantar calcaneal spurs. IMPRESSION: 1. Osteomyelitis of the distal phalanx of the right great toe involving the distal tuft and lateral aspect of the proximal to mid phalanx. 2. There is surrounding soft tissue swelling, without soft tissue air. 3. No fracture, dislocation or other evidence of osteomyelitis. Electronically Signed   By: Lajean Manes M.D.   On: 12/21/2018 18:01     Daisy Floro, DO 12/22/2018, 8:22 AM PGY-1, [place] Intern pager: [phone], text pages welcome

## 2018-12-23 ENCOUNTER — Encounter (HOSPITAL_COMMUNITY): Payer: Self-pay | Admitting: Orthopedic Surgery

## 2018-12-23 DIAGNOSIS — R9431 Abnormal electrocardiogram [ECG] [EKG]: Secondary | ICD-10-CM

## 2018-12-23 DIAGNOSIS — E1151 Type 2 diabetes mellitus with diabetic peripheral angiopathy without gangrene: Secondary | ICD-10-CM

## 2018-12-23 DIAGNOSIS — L03031 Cellulitis of right toe: Secondary | ICD-10-CM

## 2018-12-23 DIAGNOSIS — L02611 Cutaneous abscess of right foot: Secondary | ICD-10-CM

## 2018-12-23 DIAGNOSIS — N186 End stage renal disease: Secondary | ICD-10-CM

## 2018-12-23 LAB — CBC
HCT: 37.3 % (ref 36.0–46.0)
Hemoglobin: 11.9 g/dL — ABNORMAL LOW (ref 12.0–15.0)
MCH: 32.8 pg (ref 26.0–34.0)
MCHC: 31.9 g/dL (ref 30.0–36.0)
MCV: 102.8 fL — ABNORMAL HIGH (ref 80.0–100.0)
Platelets: 582 10*3/uL — ABNORMAL HIGH (ref 150–400)
RBC: 3.63 MIL/uL — ABNORMAL LOW (ref 3.87–5.11)
RDW: 14.2 % (ref 11.5–15.5)
WBC: 13.8 10*3/uL — ABNORMAL HIGH (ref 4.0–10.5)
nRBC: 0 % (ref 0.0–0.2)

## 2018-12-23 LAB — RENAL FUNCTION PANEL
Albumin: 3 g/dL — ABNORMAL LOW (ref 3.5–5.0)
Anion gap: 11 (ref 5–15)
BUN: 27 mg/dL — ABNORMAL HIGH (ref 8–23)
CO2: 23 mmol/L (ref 22–32)
Calcium: 9 mg/dL (ref 8.9–10.3)
Chloride: 98 mmol/L (ref 98–111)
Creatinine, Ser: 3.81 mg/dL — ABNORMAL HIGH (ref 0.44–1.00)
GFR calc Af Amer: 14 mL/min — ABNORMAL LOW (ref >60)
GFR calc non Af Amer: 12 mL/min — ABNORMAL LOW (ref >60)
Glucose, Bld: 152 mg/dL — ABNORMAL HIGH (ref 70–99)
Phosphorus: 4.6 mg/dL (ref 2.5–4.6)
Potassium: 3.9 mmol/L (ref 3.5–5.1)
Sodium: 132 mmol/L — ABNORMAL LOW (ref 135–145)

## 2018-12-23 LAB — GLUCOSE, CAPILLARY
Glucose-Capillary: 152 mg/dL — ABNORMAL HIGH (ref 70–99)
Glucose-Capillary: 128 mg/dL — ABNORMAL HIGH (ref 70–99)
Glucose-Capillary: 140 mg/dL — ABNORMAL HIGH (ref 70–99)
Glucose-Capillary: 127 mg/dL — ABNORMAL HIGH (ref 70–99)

## 2018-12-23 LAB — HEPARIN LEVEL (UNFRACTIONATED)
Heparin Unfractionated: 0.41 IU/mL (ref 0.30–0.70)
Heparin Unfractionated: 0.6 IU/mL (ref 0.30–0.70)

## 2018-12-23 MED ORDER — VANCOMYCIN HCL IN DEXTROSE 1-5 GM/200ML-% IV SOLN
INTRAVENOUS | Status: AC
  Administered 2018-12-23: 1000 mg
  Filled 2018-12-23: qty 200

## 2018-12-23 MED ORDER — CALCITRIOL 0.25 MCG PO CAPS
ORAL_CAPSULE | ORAL | Status: AC
  Filled 2018-12-23: qty 1

## 2018-12-23 MED ORDER — HYDROMORPHONE HCL 1 MG/ML IJ SOLN
INTRAMUSCULAR | Status: AC
  Filled 2018-12-23: qty 0.5

## 2018-12-23 MED ORDER — ACETAMINOPHEN 650 MG RE SUPP: 650.0000 mg | Freq: Four times a day (QID) | RECTAL | Status: DC

## 2018-12-23 MED ORDER — CALCITRIOL 0.5 MCG PO CAPS
ORAL_CAPSULE | ORAL | Status: AC
  Filled 2018-12-23: qty 1

## 2018-12-23 MED ORDER — HYDROMORPHONE HCL 1 MG/ML IJ SOLN
INTRAMUSCULAR | Status: AC
  Filled 2018-12-23: qty 1

## 2018-12-23 MED ORDER — HEPARIN SODIUM (PORCINE) 1000 UNIT/ML DIALYSIS: 1000.0000 [IU] | INTRAMUSCULAR | Status: DC | PRN

## 2018-12-23 MED ORDER — TICAGRELOR 90 MG PO TABS
90.0000 mg | ORAL_TABLET | Freq: Two times a day (BID) | ORAL | Status: DC
  Administered 2018-12-23 – 2018-12-31 (×16): 90 mg via ORAL
  Filled 2018-12-23 (×16): qty 1

## 2018-12-23 MED ORDER — SODIUM CHLORIDE 0.9 % IV SOLN: 100.0000 mL | INTRAVENOUS | Status: DC | PRN

## 2018-12-23 MED ORDER — HYDROMORPHONE HCL 1 MG/ML IJ SOLN
1.0000 mg | INTRAMUSCULAR | Status: DC | PRN
  Administered 2018-12-23: 1 mg via INTRAVENOUS

## 2018-12-23 MED ORDER — ALTEPLASE 2 MG IJ SOLR
2.0000 mg | Freq: Once | INTRAMUSCULAR | Status: DC | PRN
  Filled 2018-12-23: qty 2

## 2018-12-23 MED ORDER — OXYCODONE HCL 5 MG PO TABS: 5.0000 mg | ORAL_TABLET | Freq: Four times a day (QID) | ORAL | Status: DC

## 2018-12-23 MED ORDER — OXYCODONE HCL 5 MG PO TABS
5.0000 mg | ORAL_TABLET | Freq: Four times a day (QID) | ORAL | Status: DC | PRN
  Administered 2018-12-23 – 2018-12-24 (×3): 5 mg via ORAL
  Filled 2018-12-23 (×3): qty 1

## 2018-12-23 MED ORDER — ACETAMINOPHEN 325 MG PO TABS
650.0000 mg | ORAL_TABLET | Freq: Four times a day (QID) | ORAL | Status: DC
  Administered 2018-12-23 – 2018-12-31 (×29): 650 mg via ORAL
  Filled 2018-12-23 (×30): qty 2

## 2018-12-23 NOTE — Progress Notes (Signed)
ANTICOAGULATION CONSULT NOTE - Initial Consult  Pharmacy Consult for Heparin/Coumadin Indication: h/o DVT and CVA   Allergies  Allergen Reactions  . Crestor [Rosuvastatin Calcium] Other (See Comments)    Leg pain    Patient Measurements: Height: 5\' 8"  (172.7 cm) Weight: 180 lb 8.9 oz (81.9 kg) IBW/kg (Calculated) : 63.9 Heparin Dosing Weight: 80kg  Vital Signs: Temp: 97.9 F (36.6 C) (03/28 0730) Temp Source: Oral (03/28 0730) BP: 137/65 (03/28 0800) Pulse Rate: 96 (03/28 0800)  Labs: Recent Labs    12/21/18 1732 12/22/18 0313 12/22/18 2236 12/23/18 0743 12/23/18 0831  HGB 12.4 11.8*  --  11.9*  --   HCT 38.6 37.5  --  37.3  --   PLT 602* 563*  --  582*  --   APTT  --  51*  --   --   --   LABPROT 18.4* 19.9*  --   --   --   INR 1.6* 1.7*  --   --   --   HEPARINUNFRC  --   --  0.27*  --  0.41  CREATININE 1.83* 2.60*  --  3.81*  --     Estimated Creatinine Clearance: 17 mL/min (A) (by C-G formula based on SCr of 3.81 mg/dL (H)).   Medical History: Past Medical History:  Diagnosis Date  . Anemia   . Arthritis    "hands, knees" (10/10/2017)  . Asthma   . CHF (congestive heart failure) ([place])   . Chronic kidney disease (CKD), stage IV (severe) ([place])    Dialysis T/ Th/ Sat  . Coronary artery disease   . H/O Clostridium difficile infection   . High cholesterol   . History of kidney stones   . Hypertension   . PVD (peripheral vascular disease) ([place])    "LLE; will have OR" (10/10/2017)  . Sleep apnea    "never given mask" (10/10/2017)  . Stroke [facility])    mild stroke mid April - 2019  . Type II diabetes mellitus ([place])    "no RX anymore" (10/10/2017)    Assessment: 63 y.o. female with h/o DVT and CVA s/p R great toe amputation, Coumadin on hold for anticipation of angiography for next week. Pharmacy consulted for heparin initiation.  Heparin level therapeutic at 0.41 today. CBCs stable, no s/sx of bleeding noted    Goal of Therapy:  Heparin level 0.3-0.7  units/ml  Monitor platelets by anticoagulation protocol: Yes   Plan:  Continue heparin drip at 1350 units/hr Warfarin on hold  Check 8 hr confirmatory heparin level  Daily HL, CBC, and INR   Gwenlyn Found, [place] D PGY1 Pharmacy Resident  Phone [phone] 12/23/2018   9:14 AM

## 2018-12-23 NOTE — Procedures (Signed)
I was present at this dialysis session. I have reviewed the session itself and made appropriate changes.   Vital signs in last 24 hours:  Temp:  [97.7 F (36.5 C)-98.5 F (36.9 C)] 97.9 F (36.6 C) (03/28 0730) Pulse Rate:  [80-103] 96 (03/28 0800) Resp:  [16-20] 18 (03/28 0730) BP: (106-154)/(40-77) 137/65 (03/28 0800) SpO2:  [94 %-99 %] 98 % (03/28 0730) Weight:  [80.8 kg-81.9 kg] 81.9 kg (03/28 0730) Weight change: -4.214 kg Filed Weights   12/21/18 2120 12/22/18 1327 12/23/18 0730  Weight: 80.8 kg 80.8 kg 81.9 kg    Recent Labs  Lab 12/22/18 0313  NA 136  K 4.0  CL 99  CO2 24  GLUCOSE 101*  BUN 13  CREATININE 2.60*  CALCIUM 9.2    Recent Labs  Lab 12/21/18 1732 12/22/18 0313 12/23/18 0743  WBC 12.0* 11.5* 13.8*  NEUTROABS 8.6*  --   --   HGB 12.4 11.8* 11.9*  HCT 38.6 37.5 37.3  MCV 101.6* 102.2* 102.8*  PLT 602* 563* 582*    Scheduled Meds: . amLODipine  5 mg Oral QPM  . atorvastatin  20 mg Oral q1800  . calcitRIOL  0.75 mcg Oral Q T,Th,Sa-HD  . calcium acetate  667 mg Oral BID WC  . carvedilol  25 mg Oral BID  . Chlorhexidine Gluconate Cloth  6 each Topical Q0600  . docusate sodium  100 mg Oral BID  . hydrALAZINE  25 mg Oral BID  . insulin aspart  0-9 Units Subcutaneous TID WC  . multivitamin  1 tablet Oral QHS  . sodium chloride flush  3 mL Intravenous Q12H   Continuous Infusions: . sodium chloride    . sodium chloride    . sodium chloride    . sodium chloride    . cefTAZidime (FORTAZ)  IV    . heparin 1,350 Units/hr (12/23/18 0029)  . vancomycin     PRN Meds:.sodium chloride, sodium chloride, sodium chloride, acetaminophen **OR** acetaminophen, alteplase, bisacodyl, heparin, HYDROmorphone (DILAUDID) injection, magnesium citrate, metoCLOPramide **OR** metoCLOPramide (REGLAN) injection, ondansetron **OR** ondansetron (ZOFRAN) IV, oxyCODONE, polyethylene glycol, sodium chloride flush    Dialysis Orders: Struble 4 hr  TTS 3 K 2.25 Ca EDW 81.5  right IJ TDC heparin 3000 with 3000 mid treatment dose no ESA or Fe calctiriol 0.75 Recent labs: hgb 11.4  ipTH 313  Assessment/Plan: 1.  right great toe osteomyelitis with purulent abscess and mile cellilitis foot/LE - seen by Dr. Sharol Given - s/p 1st ray amputation 12/22/18- VVS to see for eval for revascularization 2.  ESRD -  TTS - HD tomorrow 3.  Hypertension/volume  - titrate volume with HD - prone to large IDWG 4.  Anemia  - hgb stable - no ESA 5.  Metabolic bone disease -  continue VDRA/phoslo 6.  Nutrition - renal carb mod diet/vits 7.  Lack of permanent dialysis access - seen 10/31/18 - needs f'gram before proceding - was planned for 2/11 but it was cancelled after waiting all day due to a vascular emergency - transportation is a huge barrier for her living in [facility]; it would be nice if we could do this while she is here.   8. Hx of bed bugs  9. Hx CVA/CAD 10. Hx DVT - on coumadin-  Donetta Potts,  MD 12/23/2018, 8:20 AM

## 2018-12-23 NOTE — Progress Notes (Signed)
PT Cancellation Note  Patient Details Name: JALEEYA MCNELLY MRN: [id number] DOB: 1956-02-13   Cancelled Treatment:    Reason Eval/Treat Not Completed: Patient at procedure or test/unavailable.. Patient in HD. Will attempt again tomorrow.   Shanna Cisco 12/23/2018, 12:08 PM

## 2018-12-23 NOTE — Progress Notes (Signed)
The patient is going to hemodialysis via stretcher.

## 2018-12-23 NOTE — Progress Notes (Signed)
MD states that patient does not need contact precaution. Will remove PPE from room door.

## 2018-12-23 NOTE — Progress Notes (Signed)
IV team consult needed for IV antibiotics- ordered and awaiting

## 2018-12-23 NOTE — Progress Notes (Signed)
Subjective: 1 Day Post-Op Procedure(s) (LRB): Right foot 1st ray amputation (Right) Patient currently in dialysis.  Nurse states that she did well overnight.    Objective: Vital signs in last 24 hours: Temp:  [97.7 F (36.5 C)-98.5 F (36.9 C)] 97.9 F (36.6 C) (03/28 0730) Pulse Rate:  [80-103] 96 (03/28 0800) Resp:  [16-20] 18 (03/28 0730) BP: (106-154)/(40-77) 137/65 (03/28 0800) SpO2:  [94 %-99 %] 98 % (03/28 0730) Weight:  [80.8 kg-81.9 kg] 81.9 kg (03/28 0730)  Intake/Output from previous day: 03/27 0701 - 03/28 0700 In: 713.9 [P.O.:360; I.V.:353.9] Out: 0  Intake/Output this shift: No intake/output data recorded.  Recent Labs    12/21/18 1732 12/22/18 0313 12/23/18 0743  HGB 12.4 11.8* 11.9*   Recent Labs    12/22/18 0313 12/23/18 0743  WBC 11.5* 13.8*  RBC 3.67* 3.63*  HCT 37.5 37.3  PLT 563* 582*   Recent Labs    12/22/18 0313 12/23/18 0743  NA 136 132*  K 4.0 3.9  CL 99 98  CO2 24 23  BUN 13 27*  CREATININE 2.60* 3.81*  GLUCOSE 101* 152*  CALCIUM 9.2 9.0   Recent Labs    12/21/18 1732 12/22/18 0313  INR 1.6* 1.7*    Patient currently in dialysis No drainage to canister according to chart   Assessment/Plan: 1 Day Post-Op Procedure(s) (LRB): Right foot 1st ray amputation (Right) Up with therapy  NWB RLE Continue wound vac for one week  F/u with Dr. Sharol Given in one week      Aundra Dubin 12/23/2018, 8:55 AM

## 2018-12-23 NOTE — Progress Notes (Signed)
OT Cancellation Note  Patient Details Name: Kristen Berry MRN: [id number] DOB: [birth year]   Cancelled Treatment:    Reason Eval/Treat Not Completed: Patient at procedure or test/ unavailable(HD)  [name] 12/23/2018, 9:53 AM   [name] OTR/L Acute Rehabilitation Services Pager: [phone] Office: [phone]

## 2018-12-23 NOTE — Progress Notes (Signed)
Family Medicine Teaching Service Daily Progress Note Intern Pager: [phone]  Patient name: Kristen Berry Medical record number: [id number] Date of birth: [birth year] Age: 63 y.o. Gender: female  Primary Care Provider: Martinique, Sarah T, MD Consultants: Nephrology, orthopedics Code Status: DNR  Pt Overview and Major Events to Date:  12/21/2018 - Admitted  Assessment and Plan: ROBINETTE Berry is a 63 y.o. female presenting with osteomyelitis of the right great toe.Kristen Berry PMH is significant for osteomyelitis of the left toes s/p amputation, ESRD, diabetes type 2, hypertension, history of DVT, history of stroke, CHF, MGUS  Osteomyelitis of right first toe, s/p day 1 amputation: Pain is not well controlled this am on dilaudid 0.5mg  prn q4 and oxy 5mg  prn q4. Right foot is wrapped , wound vac in place. Hx of amputation of left second and third toe by VVS. . -Ortho consulted; abx 24 hours after surgery for osteomyelitis (4pm 3/28); Follow up with Dr. Sharol Given in one week. -Plan for heparin bridge and restarting Brilinta after surgery -Increased dilaudid to 1mg  q4prn, oxycodone 5mg  q6 prn, tylenol scheduled -Ceftaz to stop after today's dose; cont Vanc see cellulitis problem -Daily weights/ I&O -Vitals per routine -PT/OT to assess  Right leg cellulitis, improved: foot wrapped with wound vac in place today. She has remained afebrile and WBC 13.8 slightly increased from previous 11.5. No WoundCx taken post-op; given history of MRSA recommend continuting Vanc with dialysis. Will treat for MRSA given she has had it in the past. - Cont Vancomycin with dialysis for 2 weeks. Please place OPAT order on discharge. - Cont to monitor regression of rash.  ESRD on HD TThS, chronic, stable: patient received hemodialysis earlier today.  Creatinine is 1.83>2.6, GFR is 29.  Calcium 8.8, albumin 3.4. Other electrolytes normal. -Nephro consulted, appreciate recs -Renally dose medications -Continue PhosLo  Diabetes  mellitus type 2: A1c 5.5% 12/22/2018. Diet controlled. -sSSI, CBGs q4 hours  HFrEF: January 2019 patient had echo that showed LVEF 35 to 40%.  Septal and apical akinesis, moderately dilated LV, mild LVH. -Continue HD. No other tx at this time.  CAD: left heart cath in February 2019 showed severe diffuse diabetic coronary disease, 70 to 80% stenosis of proximal LAD 75% obstruction of RCA -holding brilinta in setting of needing surgery -Discuss Brilinta and Trental with VSS today  Mild asthma: since childhood. Hasn't used her albuterol in months.  No other medications. -Hold albuterol  HTN: BP 132/61. Takes amlodipine 5mg , Coreg 25mg  BID, hydralazine 25mg  BID at home. -Continue home meds  HLD: pt takes 20mg  lipitor at home. Last Lipid panel January 2019 with Trig 180, HDL 39, and LDL 109. -Continue Lipitor  MGUS: diagnosed 2017.  Patient not currently on any medication.  Seen by Dr. Alvy Bimler at [facility].  History of left leg DVT: pt takes warfarin 4mg  TTSS/5mg  MWF.  Patient has history of DVT in left leg. Unknown when she first started warfarin, has been at least 1 year.  Patient currently subtherapeutic with INR of 1.6.  - Will not restart Warfarin per VSS recs as they plan on arteriography next week.  Hx of stroke -patient states she had a stroke April 2019.  Was treated at [facility] in [place]. Does not appear to have any residual deficits.  Patient takes Brilinta 90 mg twice daily.  -Hold Brilinta. Restart after surgery  FEN/GI: NPO Pending surgery Prophylaxis: SubQ heparin. Warfarin after surgery  Disposition: Discharge once home  Subjective:  Patient seen resting in bed,  tearful because of pain in her foot from amputation. Otherwise, she states that she is doing well and feels good.  Objective: Temp:  [97.7 F (36.5 C)-98.5 F (36.9 C)] 97.9 F (36.6 C) (03/28 0730) Pulse Rate:  [80-103] 96 (03/28 0800) Resp:  [16-20] 18 (03/28  0730) BP: (106-154)/(40-77) 137/65 (03/28 0800) SpO2:  [94 %-99 %] 98 % (03/28 0730) Weight:  [80.8 kg-81.9 kg] 81.9 kg (03/28 0730) Physical Exam: Gen: Alert and Oriented x 3, NAD HEENT: Normocephalic, atraumatic CV: RRR, no murmurs, normal S1, S2 split Resp: CTAB, no wheezing, rales, or rhonchi, comfortable work of breathing Abd: non-distended, non-tender, soft, +bs in all four quadrants Right Foot: wrapped with wound vac in place Ext: no clubbing, cyanosis, or edema Skin: warm, dry, intact, no rashes  Laboratory: Recent Labs  Lab 12/21/18 1732 12/22/18 0313 12/23/18 0743  WBC 12.0* 11.5* 13.8*  HGB 12.4 11.8* 11.9*  HCT 38.6 37.5 37.3  PLT 602* 563* 582*   Recent Labs  Lab 12/21/18 1732 12/22/18 0313 12/23/18 0743  NA 135 136 132*  K 3.7 4.0 3.9  CL 98 99 98  CO2 25 24 23   BUN 7* 13 27*  CREATININE 1.83* 2.60* 3.81*  CALCIUM 8.8* 9.2 9.0  PROT 8.1  --   --   BILITOT 0.5  --   --   ALKPHOS 108  --   --   ALT 10  --   --   AST 16  --   --   GLUCOSE 90 101* 152*   Imaging/Diagnostic Tests: Dg Foot Complete Right  Result Date: 12/21/2018 CLINICAL DATA:  Pt presents with necrotic R big toe, reports 1 week h/o of redness, drainage with foul odor. Pt states the pain radiates from her big toe up into her lower leg, right foot and lower leg has associated swelling and redness. Pt denies any injury to right foot. Hx of diabetes and PVD. EXAM: RIGHT FOOT COMPLETE - 3+ VIEW COMPARISON:  None. FINDINGS: There is resorption of a portion of the distal tuft, along its plantar lateral margin, and along the lateral aspect of the proximal to mid aspect of the great toe distal phalanx, findings consistent with osteomyelitis. There are no other areas of bone resorption to suggest additional osteomyelitis. No fracture.  Joints are normally aligned. There is soft tissue swelling of the great toe without soft tissue air. Large dorsal and plantar calcaneal spurs. IMPRESSION: 1. Osteomyelitis  of the distal phalanx of the right great toe involving the distal tuft and lateral aspect of the proximal to mid phalanx. 2. There is surrounding soft tissue swelling, without soft tissue air. 3. No fracture, dislocation or other evidence of osteomyelitis. Electronically Signed   By: Lajean Manes M.D.   On: 12/21/2018 18:01     Nuala Alpha, DO 12/23/2018, 8:51 AM PGY-2, [facility] Intern pager: [phone], text pages welcome

## 2018-12-23 NOTE — Progress Notes (Signed)
ANTICOAGULATION CONSULT NOTE - Initial Consult  Pharmacy Consult for Heparin/Coumadin Indication: h/o DVT and CVA   Allergies  Allergen Reactions  . Crestor [Rosuvastatin Calcium] Other (See Comments)    Leg pain    Patient Measurements: Height: 5\' 8"  (172.7 cm) Weight: 175 lb 14.8 oz (79.8 kg) IBW/kg (Calculated) : 63.9 Heparin Dosing Weight: 80kg  Vital Signs: Temp: 97.7 F (36.5 C) (03/28 1239) Temp Source: Oral (03/28 1239) BP: 106/75 (03/28 1239) Pulse Rate: 84 (03/28 1239)  Labs: Recent Labs    12/21/18 1732 12/22/18 0313 12/22/18 2236 12/23/18 0743 12/23/18 0831 12/23/18 1626  HGB 12.4 11.8*  --  11.9*  --   --   HCT 38.6 37.5  --  37.3  --   --   PLT 602* 563*  --  582*  --   --   APTT  --  51*  --   --   --   --   LABPROT 18.4* 19.9*  --   --   --   --   INR 1.6* 1.7*  --   --   --   --   HEPARINUNFRC  --   --  0.27*  --  0.41 0.60  CREATININE 1.83* 2.60*  --  3.81*  --   --     Estimated Creatinine Clearance: 16.8 mL/min (A) (by C-G formula based on SCr of 3.81 mg/dL (H)).   Medical History: Past Medical History:  Diagnosis Date  . Anemia   . Arthritis    "hands, knees" (10/10/2017)  . Asthma   . CHF (congestive heart failure) ([place])   . Chronic kidney disease (CKD), stage IV (severe) ([place])    Dialysis T/ Th/ Sat  . Coronary artery disease   . H/O Clostridium difficile infection   . High cholesterol   . History of kidney stones   . Hypertension   . PVD (peripheral vascular disease) ([place])    "LLE; will have OR" (10/10/2017)  . Sleep apnea    "never given mask" (10/10/2017)  . Stroke [facility])    mild stroke mid April - 2019  . Type II diabetes mellitus ([place])    "no RX anymore" (10/10/2017)    Assessment:  7 yoF with h/o DVT and CVA s/p R great toe amputation, on Coumadin PTA but currently on hold in anticipation of angiography for next week. Pharmacy consulted to dose IV heparin. Heparin level remains therapeutic this PM at 0.60. CBC stable,  no s/sx of bleeding noted.  Goal of Therapy:  Heparin level 0.3-0.7 units/ml  Monitor platelets by anticoagulation protocol: Yes   Plan:  Continue heparin drip at 1350 units/hr Warfarin on hold  Daily HL, CBC, and INR Monitor s/sx of bleeding  Deardra Hinkley N. Gerarda Fraction, PharmD, [place] PGY2 Infectious Diseases Pharmacy Resident Phone: [phone] 12/23/2018   4:48 PM

## 2018-12-24 LAB — CBC
HCT: 36.3 % (ref 36.0–46.0)
Hemoglobin: 11.6 g/dL — ABNORMAL LOW (ref 12.0–15.0)
MCH: 32.6 pg (ref 26.0–34.0)
MCHC: 32 g/dL (ref 30.0–36.0)
MCV: 102 fL — ABNORMAL HIGH (ref 80.0–100.0)
Platelets: 538 10*3/uL — ABNORMAL HIGH (ref 150–400)
RBC: 3.56 MIL/uL — ABNORMAL LOW (ref 3.87–5.11)
RDW: 14.5 % (ref 11.5–15.5)
WBC: 13.8 10*3/uL — ABNORMAL HIGH (ref 4.0–10.5)
nRBC: 0 % (ref 0.0–0.2)

## 2018-12-24 LAB — RENAL FUNCTION PANEL
Albumin: 3.1 g/dL — ABNORMAL LOW (ref 3.5–5.0)
Anion gap: 16 — ABNORMAL HIGH (ref 5–15)
BUN: 20 mg/dL (ref 8–23)
CO2: 22 mmol/L (ref 22–32)
Calcium: 9.1 mg/dL (ref 8.9–10.3)
Chloride: 93 mmol/L — ABNORMAL LOW (ref 98–111)
Creatinine, Ser: 3.19 mg/dL — ABNORMAL HIGH (ref 0.44–1.00)
GFR calc Af Amer: 17 mL/min — ABNORMAL LOW (ref >60)
GFR calc non Af Amer: 15 mL/min — ABNORMAL LOW (ref >60)
Glucose, Bld: 129 mg/dL — ABNORMAL HIGH (ref 70–99)
Phosphorus: 4 mg/dL (ref 2.5–4.6)
Potassium: 3.8 mmol/L (ref 3.5–5.1)
Sodium: 131 mmol/L — ABNORMAL LOW (ref 135–145)

## 2018-12-24 LAB — GLUCOSE, CAPILLARY
Glucose-Capillary: 111 mg/dL — ABNORMAL HIGH (ref 70–99)
Glucose-Capillary: 118 mg/dL — ABNORMAL HIGH (ref 70–99)
Glucose-Capillary: 131 mg/dL — ABNORMAL HIGH (ref 70–99)
Glucose-Capillary: 139 mg/dL — ABNORMAL HIGH (ref 70–99)

## 2018-12-24 LAB — HEPARIN LEVEL (UNFRACTIONATED): Heparin Unfractionated: 0.42 IU/mL (ref 0.30–0.70)

## 2018-12-24 MED ORDER — PENTOXIFYLLINE ER 400 MG PO TBCR: 400.0000 mg | EXTENDED_RELEASE_TABLET | Freq: Three times a day (TID) | ORAL | Status: DC

## 2018-12-24 MED ORDER — HYDROMORPHONE BOLUS VIA INFUSION: 1.0000 mg | INTRAVENOUS | Status: DC | PRN

## 2018-12-24 MED ORDER — OXYCODONE HCL 5 MG PO TABS: 5.0000 mg | ORAL_TABLET | Freq: Four times a day (QID) | ORAL | Status: DC | PRN

## 2018-12-24 MED ORDER — OXYCODONE HCL 5 MG PO TABS
5.0000 mg | ORAL_TABLET | Freq: Four times a day (QID) | ORAL | Status: DC | PRN
  Administered 2018-12-24 – 2018-12-25 (×3): 5 mg via ORAL
  Filled 2018-12-24 (×3): qty 1

## 2018-12-24 MED ORDER — HYDROMORPHONE HCL 1 MG/ML IJ SOLN
1.0000 mg | INTRAMUSCULAR | Status: DC | PRN
  Administered 2018-12-24 – 2018-12-25 (×2): 1 mg via INTRAVENOUS
  Filled 2018-12-24 (×2): qty 1

## 2018-12-24 MED ORDER — PENTOXIFYLLINE ER 400 MG PO TBCR
400.0000 mg | EXTENDED_RELEASE_TABLET | Freq: Every day | ORAL | Status: DC
  Administered 2018-12-25 – 2018-12-30 (×6): 400 mg via ORAL
  Filled 2018-12-24 (×8): qty 1

## 2018-12-24 NOTE — Progress Notes (Signed)
   VASCULAR SURGERY ASSESSMENT & PLAN:   PERIPHERAL VASCULAR DISEASE WITH OSTEOMYELITIS AND GANGRENE OF THE RIGHT GREAT TOE: I reviewed Dr. Jess Barters operative report.  There was minimal petechial bleeding at the site of the right great toe amputation.  She will need arteriography.  She dialyzes Tuesdays Thursdays and Saturdays and I will schedule this for Wednesday.  Given the coronavirus situation we do not have adequate coverage to get her arteriogram done tomorrow.  Her Coumadin is on hold.  I have ordered ABIs for tomorrow to have as a baseline.  ANTICOAGULATION: The patient was on Coumadin for a prior DVT.  Is not clear when the DVT was but it has been likely over a year.  I have held her Coumadin in anticipation of arteriography next week.  She is on intravenous heparin.   END-STAGE RENAL DISEASE: The patient dialyzes on Tuesdays Thursdays and Saturdays normally.  She has a functioning catheter.  She had a fistula in her left upper arm which has not matured adequately.  She was evaluated in our office on 10/31/2018 by Dr. Carlis Abbott.  The fistula had been placed in February 2019.  A fistulogram was recommended but this had not been completed.   We will try to obtain a fistulogram at the same time as her arteriogram on Wednesday.  SUBJECTIVE:   No specific complaints.  PHYSICAL EXAM:   Vitals:   12/23/18 1802 12/23/18 2214 12/24/18 0500 12/24/18 0504  BP: (!) 143/54 108/75  (!) 114/57  Pulse: 86 70  96  Resp: 18 (!) 22  17  Temp:  97.7 F (36.5 C)  97.7 F (36.5 C)  TempSrc:  Oral    SpO2: 100% 99%  96%  Weight:  79.8 kg 79.8 kg   Height:       Her dressing is on the right foot so unable to assess Doppler flow. Her left upper arm fistula has a weak thrill.  LABS:   Lab Results  Component Value Date   WBC 13.8 (H) 12/24/2018   HGB 11.6 (L) 12/24/2018   HCT 36.3 12/24/2018   MCV 102.0 (H) 12/24/2018   PLT 538 (H) 12/24/2018   Lab Results  Component Value Date   CREATININE 3.19  (H) 12/24/2018   Lab Results  Component Value Date   INR 1.7 (H) 12/22/2018   CBG (last 3)  Recent Labs    12/23/18 1635 12/23/18 2215 12/24/18 0733  GLUCAP 140* 127* 111*    PROBLEM LIST:    Active Problems:   Osteomyelitis of toe of right foot (HCC)   Gangrene of toe of right foot (HCC)   CURRENT MEDS:   . acetaminophen  650 mg Oral Q6H   Or  . acetaminophen  650 mg Rectal Q6H  . amLODipine  5 mg Oral QPM  . atorvastatin  20 mg Oral q1800  . calcitRIOL  0.75 mcg Oral Q T,Th,Sa-HD  . calcium acetate  667 mg Oral BID WC  . carvedilol  25 mg Oral BID  . Chlorhexidine Gluconate Cloth  6 each Topical Q0600  . docusate sodium  100 mg Oral BID  . hydrALAZINE  25 mg Oral BID  . insulin aspart  0-9 Units Subcutaneous TID WC  . multivitamin  1 tablet Oral QHS  . sodium chloride flush  3 mL Intravenous Q12H  . ticagrelor  90 mg Oral BID    Deitra Mayo Beeper: [phone] Office: [phone] 12/24/2018

## 2018-12-24 NOTE — Progress Notes (Signed)
Physical Therapy Note  Patient suffers from R foot first ray amputation with weight bearing restrictions, which impairs their ability to perform daily activities like walking, and general mobility in the home.  A walker alone will not resolve the issues with performing activities of daily living. A wheelchair will allow patient to safely perform daily activities.  The patient can self propel in the home or has a caregiver who can provide assistance.      Kristen Berry, [place]  Acute Rehabilitation Services Pager [phone] Office [phone]

## 2018-12-24 NOTE — Progress Notes (Signed)
ANTICOAGULATION CONSULT NOTE - Initial Consult  Pharmacy Consult for Heparin/Coumadin Indication: h/o DVT and CVA   Allergies  Allergen Reactions  . Crestor [Rosuvastatin Calcium] Other (See Comments)    Leg pain    Patient Measurements: Height: 5\' 8"  (172.7 cm) Weight: 175 lb 14.8 oz (79.8 kg) IBW/kg (Calculated) : 63.9 Heparin Dosing Weight: 80kg  Vital Signs: Temp: 97.7 F (36.5 C) (03/29 0504) Temp Source: Oral (03/Kristen 2214) BP: 114/57 (03/29 0504) Pulse Rate: 96 (03/29 0504)  Labs: Recent Labs    12/21/18 1732 12/22/18 0313  03/Kristen/20 0743 03/Kristen/20 0831 03/Kristen/20 1626 12/24/18 0500  HGB 12.4 11.8*  --  11.9*  --   --  11.6*  HCT 38.6 37.5  --  37.3  --   --  36.3  PLT 602* 563*  --  582*  --   --  538*  APTT  --  51*  --   --   --   --   --   LABPROT 18.4* 19.9*  --   --   --   --   --   INR 1.6* 1.7*  --   --   --   --   --   HEPARINUNFRC  --   --    < >  --  0.41 0.60 0.42  CREATININE 1.83* 2.60*  --  3.81*  --   --  3.19*   < > = values in this interval not displayed.    Estimated Creatinine Clearance: 20 mL/min (A) (by C-G formula based on SCr of 3.19 mg/dL (H)).   Medical History: Past Medical History:  Diagnosis Date  . Anemia   . Arthritis    "hands, knees" (10/10/2017)  . Asthma   . CHF (congestive heart failure) ([place])   . Chronic kidney disease (CKD), stage IV (severe) ([place])    Dialysis T/ Th/ Sat  . Coronary artery disease   . H/O Clostridium difficile infection   . High cholesterol   . History of kidney stones   . Hypertension   . PVD (peripheral vascular disease) ([place])    "LLE; will have OR" (10/10/2017)  . Sleep apnea    "never given mask" (10/10/2017)  . Stroke [facility])    mild stroke mid April - 2019  . Type II diabetes mellitus ([place])    "no RX anymore" (10/10/2017)    Assessment:  Kristen Berry with h/o DVT and CVA s/p R great toe amputation, on Coumadin PTA but currently on hold in anticipation of angiography for next week. Pharmacy  consulted to dose IV heparin. Heparin level remains therapeutic this AM at 0.41. CBC stable, no s/sx of bleeding noted.  Goal of Therapy:  Heparin level 0.3-0.7 units/ml  Monitor platelets by anticoagulation protocol: Yes   Plan:  Continue heparin drip at 1350 units/hr Warfarin on hold  Daily HL, CBC, and INR Monitor s/sx of bleeding  Gwenlyn Found, Sherian Rein D PGY1 Pharmacy Resident  Phone [phone] 12/24/2018   7:39 AM

## 2018-12-24 NOTE — Progress Notes (Signed)
Family Medicine Teaching Service Daily Progress Note Intern Pager: [phone]  Patient name: Kristen Berry Medical record number: [id number] Date of birth: December 29, 1955 Age: 63 y.o. Gender: female  Primary Care Provider: Martinique, Sarah T, MD Consultants: Nephrology, orthopedics Code Status: DNR  Pt Overview and Major Events to Date:  12/21/2018 - Admitted 12/22/2018 -right great toe amputation  Assessment and Plan: Kristen Berry is a 63 y.o. female presenting with osteomyelitis of the right great toe.Kristen Berry PMH is significant for osteomyelitis of the left toes s/p amputation, ESRD, diabetes type 2, hypertension, history of DVT, history of stroke, CHF, MGUS  Osteomyelitis of right first toe, s/p day 1 amputation: Pain well controlled this morning, Dilaudid 1mg  prn q4 and oxy 5mg  prn q6.  Right foot is wrapped with wound VAC in place.  -Ortho consulted; Follow up with Dr. Sharol Given in one week. -Continue heparin drip, Brilinta, and Trental leading up to arteriography/fistulogram on Wednesday 4/1 -Dilaudid 1mg  q4prn, oxycodone 5mg  q6 prn, tylenol scheduled -Continue Vancomycin TTS with HD for 2 weeks -Daily weights/ I&O -Vitals per routine -PT/OT to assess  Right leg cellulitis, improved: foot wrapped with wound vac in place today. She has remained afebrile and WBC 13.8 slightly increased from previous 11.5. No WoundCx taken post-op; given history of MRSA recommend continuting Vanc with dialysis. Will treat for MRSA given she has had it in the past.  - Cont Vancomycin with dialysis for 2 weeks. Please place OPAT order on discharge. - Cont to monitor regression of rash.  ESRD on HD TThS, chronic, stable: patient received hemodialysis earlier today.  Creatinine is 1.83>2.6, GFR is 29.  Calcium 8.8, albumin 3.4. Other electrolytes normal. -Nephro consulted, appreciate recs -Renally dose medications -Continue PhosLo  Diabetes mellitus type 2: A1c 5.5% 12/22/2018. Diet controlled. -sSSI, CBGs q4  hours  HFrEF: January 2019 patient had echo that showed LVEF 35 to 40%.  Septal and apical akinesis, moderately dilated LV, mild LVH. -Continue HD. No other tx at this time.  CAD: left heart cath in February 2019 showed severe diffuse diabetic coronary disease, 70 to 80% stenosis of proximal LAD 75% obstruction of RCA -Continue heparin drip, Brilinta, and Trental  Mild asthma: since childhood. Hasn't used her albuterol in months. No other medications. -Hold albuterol  HTN, chronic, stable: BP 98/52. Takes amlodipine 5mg , Coreg 25mg  BID, hydralazine 25mg  BID at home. -Continue home meds  HLD: pt takes 20mg  lipitor at home. Last Lipid panel January 2019 with Trig 180, HDL 39, and LDL 109. -Continue Lipitor  MGUS: diagnosed 2017.  Patient not currently on any medication.  Seen by Dr. Alvy Bimler at [facility].  History of left leg DVT: pt takes warfarin 4mg  TTSS/5mg  MWF.  Patient has history of DVT in left leg. Unknown when she first started warfarin, has been at least 1 year.  Patient currently subtherapeutic with INR of 1.6.  -Continue Brilinta, Trental, heparin gtt; hold Warfarin for bridging s/p Arteriography/Fistulogram Wednesday 04/01   Hx of stroke: patient states she had a stroke April 2019.  Was treated at [facility] in [place]. Does not appear to have any residual deficits.  Patient takes Brilinta 90 mg twice daily.  -Continue Brilinta, Trental, heparin gtt; hold Warfarin s/p Arteriography/Fistulogram Wednesday 04/01   FEN/GI: Renal w/ fluid restricted Prophylaxis: SubQ heparin, Brilinta, Trental  Disposition: Discharge once stable s/p Arteriography/Fistulogram on 04/01  Subjective:  Patient seen resting in her recliner, no apparent distress this morning.  States pain in R foot is well  controlled.   Objective: Temp:  [97.6 F (36.4 C)-97.7 F (36.5 C)] 97.7 F (36.5 C) (03/29 0938) Pulse Rate:  [70-96] 95 (03/29 0938) Resp:  [17-22] 18 (03/29  0938) BP: (98-148)/(52-75) 98/52 (03/29 0938) SpO2:  [96 %-100 %] 100 % (03/29 0938) Weight:  [79.8 kg] 79.8 kg (03/29 0500) Physical Exam: Gen: No apparent distress, cooperative, pleasant female CV: Regular rate and rhythm, S1-S2 present, no murmurs, rubs, or gallops Resp: Clear to auscultation bilaterally, no respiratory distress  Abd: Nontender to palpation, nondistended, bowel sounds in 4 quadrants  Right Foot: wrapped with wound vac in place, no evidence of infection or DVT Ext: Right first toe and left second and third toes S/P amputation, no clubbing, cyanosis, edema, or evidence of DVT  Laboratory: Recent Labs  Lab 12/22/18 0313 12/23/18 0743 12/24/18 0500  WBC 11.5* 13.8* 13.8*  HGB 11.8* 11.9* 11.6*  HCT 37.5 37.3 36.3  PLT 563* 582* 538*   Recent Labs  Lab 12/21/18 1732 12/22/18 0313 12/23/18 0743 12/24/18 0500  NA 135 136 132* 131*  K 3.7 4.0 3.9 3.8  CL 98 99 98 93*  CO2 25 24 23 22   BUN 7* 13 27* 20  CREATININE 1.83* 2.60* 3.81* 3.19*  CALCIUM 8.8* 9.2 9.0 9.1  PROT 8.1  --   --   --   BILITOT 0.5  --   --   --   ALKPHOS 108  --   --   --   ALT 10  --   --   --   AST 16  --   --   --   GLUCOSE 90 101* 152* 129*   Imaging/Diagnostic Tests: Dg Foot Complete Right  Result Date: 12/21/2018 CLINICAL DATA:  Pt presents with necrotic R big toe, reports 1 week h/o of redness, drainage with foul odor. Pt states the pain radiates from her big toe up into her lower leg, right foot and lower leg has associated swelling and redness. Pt denies any injury to right foot. Hx of diabetes and PVD. EXAM: RIGHT FOOT COMPLETE - 3+ VIEW COMPARISON:  None. FINDINGS: There is resorption of a portion of the distal tuft, along its plantar lateral margin, and along the lateral aspect of the proximal to mid aspect of the great toe distal phalanx, findings consistent with osteomyelitis. There are no other areas of bone resorption to suggest additional osteomyelitis. No fracture.  Joints  are normally aligned. There is soft tissue swelling of the great toe without soft tissue air. Large dorsal and plantar calcaneal spurs. IMPRESSION: 1. Osteomyelitis of the distal phalanx of the right great toe involving the distal tuft and lateral aspect of the proximal to mid phalanx. 2. There is surrounding soft tissue swelling, without soft tissue air. 3. No fracture, dislocation or other evidence of osteomyelitis. Electronically Signed   By: Lajean Manes M.D.   On: 12/21/2018 18:01   Daisy Floro, DO 12/24/2018, 10:19 AM PGY-2, [place] Intern pager: [phone], text pages welcome

## 2018-12-24 NOTE — Evaluation (Signed)
Occupational Therapy Evaluation Patient Details Name: Kristen Berry MRN: [id number] DOB: 15-Oct-1955 Today's Date: 12/24/2018    History of Present Illness Pt is a 64 y/o female s/p Right foot 1st ray amputation. PMH includes ESRD on HD, provoked bilateral DVT after surgery, type 2 diabetes, MGUS, and  HFpEF    Clinical Impression   PTA: pt living with family. Pt was independent prior with no assist needed for mobility nor ADL. Pt currently performing transfers with modA and NWB RLE - able to abide by it for transfer only. Pt unsteady with hopping using RW. Pt performing UB ADL with set-upA and LB ADL with set-upA to minA using figure four pattern. Pt's pain increases with movement. Pt would greatly benefit from continued OT skilled services for ADL, mobility and safety in [facility] setting. OT to follow acutely. (If family is not able to provide 24/7; SNF would be an option for pt safety).    Follow Up Recommendations  Home health OT;Supervision/Assistance - 24 hour    Equipment Recommendations  Other (comment)(to be determined)    Recommendations for Other Services       Precautions / Restrictions Precautions Precautions: Fall Restrictions RLE Weight Bearing: Non weight bearing      Mobility Bed Mobility Overal bed mobility: Modified Independent             General bed mobility comments: No difficulty getting up; HOB slightly elevated  Transfers Overall transfer level: Needs assistance Equipment used: Rolling walker (2 wheeled) Transfers: Sit to/from [facility] Sit to Stand: Mod assist Stand pivot transfers: Mod assist       General transfer comment: Heavy mod assist to power up and to steady RW; cues for hand placement and safety; Heavy dependence on UEs to push up; Once up, able to support self with RW to Heel-toe pivot on LLE bed to recliner    Balance Overall balance assessment: Needs assistance   Sitting balance-Leahy Scale: Good     Standing  balance support: Bilateral upper extremity supported;During functional activity Standing balance-Leahy Scale: Poor Standing balance comment: Dependent on UE support                           ADL either performed or assessed with clinical judgement   ADL Overall ADL's : Needs assistance/impaired Eating/Feeding: Modified independent;Sitting   Grooming: Set up;Sitting   Upper Body Bathing: Set up;Sitting   Lower Body Bathing: Set up;Sitting/lateral leans;Sit to/from stand   Upper Body Dressing : Set up;Sitting   Lower Body Dressing: Set up;Sitting/lateral leans;Sit to/from stand   Toilet Transfer: Moderate assistance;BSC   Toileting- Clothing Manipulation and Hygiene: Moderate assistance;Sitting/lateral lean;Sit to/from stand       Functional mobility during ADLs: Moderate assistance;Rolling walker;Cueing for safety;Cueing for sequencing General ADL Comments: Pt requiring increased assist for ADL and mobility due to NWB RLE.     Vision Baseline Vision/History: Wears glasses Vision Assessment?: No apparent visual deficits     Perception     Praxis      Pertinent Vitals/Pain Pain Assessment: No/denies pain     Hand Dominance Right   Extremity/Trunk Assessment Upper Extremity Assessment Upper Extremity Assessment: Generalized weakness   Lower Extremity Assessment Lower Extremity Assessment: Defer to PT evaluation;Generalized weakness RLE Deficits / Details: Vac dressing in place at forefoot; hip, knee AROM WFL; Difficulty maintaining NWB   Cervical / Trunk Assessment Cervical / Trunk Assessment: Normal   Communication Communication Communication: No difficulties  Cognition Arousal/Alertness: Awake/alert Behavior During Therapy: WFL for tasks assessed/performed Overall Cognitive Status: Within Functional Limits for tasks assessed                                     General Comments  motivated    Exercises     Shoulder  Instructions      Home Living Family/patient expects to be discharged to:: Private residence Living Arrangements: Children Available Help at Discharge: Family;Available 24 hours/day Type of Home: Mobile home Home Access: Ramped entrance     Home Layout: One level     Bathroom Shower/Tub: Tub/shower unit;Curtain         Home Equipment: Environmental consultant - 4 wheels;Bedside commode;Tub bench   Additional Comments: Also has a lift chair that she would rather not have to sue the lift function; she sleeps in her lift chair      Prior Functioning/Environment Level of Independence: Independent with assistive device(s)        Comments: Rollator for ambulation.  Goes to HD 3 x week. Uses RTAS to go to HD        OT Problem List: Decreased strength;Decreased range of motion;Decreased activity tolerance;Impaired balance (sitting and/or standing);Decreased cognition;Decreased safety awareness;Pain      OT Treatment/Interventions: Self-care/ADL training;Therapeutic exercise;Neuromuscular education;Energy conservation;Therapeutic activities;DME and/or AE instruction;Patient/family education;Balance training    OT Goals(Current goals can be found in the care plan section) Acute Rehab OT Goals Patient Stated Goal: to get home, and she hopes to not need wc for very long OT Goal Formulation: With patient Time For Goal Achievement: 01/07/19 Potential to Achieve Goals: Good ADL Goals Pt Will Perform Lower Body Dressing: with modified independence;with adaptive equipment Pt/caregiver will Perform Home Exercise Program: Both right and left upper extremity;With written HEP provided;Independently Additional ADL Goal #1: Pt will transfer with modified independence on various surfaces in preparation for DC home.  OT Frequency: Min 2X/week   Barriers to D/C:            Co-evaluation              AM-PAC OT "6 Clicks" Daily Activity     Outcome Measure Help from another person eating meals?:  None Help from another person taking care of personal grooming?: None Help from another person toileting, which includes using toliet, bedpan, or urinal?: A Lot Help from another person bathing (including washing, rinsing, drying)?: A Lot Help from another person to put on and taking off regular upper body clothing?: A Little Help from another person to put on and taking off regular lower body clothing?: A Lot 6 Click Score: 17   End of Session Equipment Utilized During Treatment: Rolling walker;Gait belt Nurse Communication: Mobility status  Activity Tolerance: Patient tolerated treatment well;Patient limited by pain Patient left: in bed;with call bell/phone within reach;with bed alarm set  OT Visit Diagnosis: Unsteadiness on feet (R26.81);Muscle weakness (generalized) (M62.81);Other abnormalities of gait and mobility (R26.89);Pain Pain - Right/Left: Right Pain - part of body: Ankle and joints of foot                Time: 1356-1415 OT Time Calculation (min): 19 min Charges:  OT General Charges $OT Visit: 1 Visit OT Evaluation $OT Eval Moderate Complexity: 1 Mod  Darryl Nestle) Marsa Aris OTR/L Acute Rehabilitation Services Pager: [phone] Office: [phone]   Fredda Hammed 12/24/2018, 4:09 PM

## 2018-12-24 NOTE — Progress Notes (Signed)
Pharmacy Antibiotic Note  Kristen Berry is a 63 y.o. female admitted on 12/21/2018 with Osteo.  Pharmacy has been consulted for Vanco/Fortaz dosing.  CC/HPI: Necrotic R big toe with redness and drainage. 1rst ray amputation on 3/27 of right great toe w/ osteomyelitis. [name] stopped 24 hours after surgery. WBC stable, last HD session 3/28.   PMH: Anemia, arthritis, asthma, CHF, ESRD TTS, CAD, h/o cdiff, HLD, HTN, PVD, OSA, CVA 4/19, DM, h/o osteo of L toes s/p amputation. H/o DVT, MGUS  Vanco 3/19>> [name] 3/19>>3/28  Plan: Continue Vanco 1g IV q HD TTS for right leg cellulitis  Monitor pre-HD level as needed and patient clinical status   Height: 5\' 8"  (172.7 cm) Weight: 175 lb 14.8 oz (79.8 kg) IBW/kg (Calculated) : 63.9  Temp (24hrs), Avg:97.7 F (36.5 C), Min:97.6 F (36.4 C), Max:97.7 F (36.5 C)  Recent Labs  Lab 12/21/18 1732 12/22/18 0313 12/23/18 0743 12/24/18 0500  WBC 12.0* 11.5* 13.8* 13.8*  CREATININE 1.83* 2.60* 3.81* 3.19*    Estimated Creatinine Clearance: 20 mL/min (A) (by C-G formula based on SCr of 3.19 mg/dL (H)).    Allergies  Allergen Reactions  . Crestor [Rosuvastatin Calcium] Other (See Comments)    Leg pain    Gwenlyn Found, [place] D PGY1 Pharmacy Resident  Phone [phone] 12/24/2018   7:43 AM

## 2018-12-24 NOTE — Progress Notes (Addendum)
[facility] Progress Note   Subjective:  Seen in room. No CP/dyspnea/cough. No foot pain at this time.  Objective Vitals:   12/23/18 1802 12/23/18 2214 12/24/18 0500 12/24/18 0504  BP: (!) 143/54 108/75  (!) 114/57  Pulse: 86 70  96  Resp: 18 (!) 22  17  Temp:  97.7 F (36.5 C)  97.7 F (36.5 C)  TempSrc:  Oral    SpO2: 100% 99%  96%  Weight:  79.8 kg 79.8 kg   Height:       Physical Exam General: Well appearing woman, NAD Heart: RRR; no murmur Lungs: CTAB Abdomen: soft, non-tender Extremities: R foot s/p 1st ray amp in boot with wound vac, no LLE edema Dialysis Access: TDC in R chest + poorly maturing LUE AVF + bruit  Additional Objective Labs: Basic Metabolic Panel: Recent Labs  Lab 12/22/18 0313 12/23/18 0743 12/24/18 0500  NA 136 132* 131*  K 4.0 3.9 3.8  CL 99 98 93*  CO2 24 23 22   GLUCOSE 101* 152* 129*  BUN 13 27* 20  CREATININE 2.60* 3.81* 3.19*  CALCIUM 9.2 9.0 9.1  PHOS  --  4.6 4.0   Liver Function Tests: Recent Labs  Lab 12/21/18 1732 12/23/18 0743 12/24/18 0500  AST 16  --   --   ALT 10  --   --   ALKPHOS 108  --   --   BILITOT 0.5  --   --   PROT 8.1  --   --   ALBUMIN 3.4* 3.0* 3.1*   CBC: Recent Labs  Lab 12/21/18 1732 12/22/18 0313 12/23/18 0743 12/24/18 0500  WBC 12.0* 11.5* 13.8* 13.8*  NEUTROABS 8.6*  --   --   --   HGB 12.4 11.8* 11.9* 11.6*  HCT 38.6 37.5 37.3 36.3  MCV 101.6* 102.2* 102.8* 102.0*  PLT 602* 563* 582* 538*   Medications: . sodium chloride    . sodium chloride    . sodium chloride    . sodium chloride    . heparin 1,350 Units/hr (12/24/18 0823)  . vancomycin     . acetaminophen  650 mg Oral Q6H   Or  . acetaminophen  650 mg Rectal Q6H  . amLODipine  5 mg Oral QPM  . atorvastatin  20 mg Oral q1800  . calcitRIOL  0.75 mcg Oral Q T,Th,Sa-HD  . calcium acetate  667 mg Oral BID WC  . carvedilol  25 mg Oral BID  . Chlorhexidine Gluconate Cloth  6 each Topical Q0600  . docusate  sodium  100 mg Oral BID  . hydrALAZINE  25 mg Oral BID  . insulin aspart  0-9 Units Subcutaneous TID WC  . multivitamin  1 tablet Oral QHS  . sodium chloride flush  3 mL Intravenous Q12H  . ticagrelor  90 mg Oral BID    Dialysis Orders: [place] 4 hr  TTS 3 K 2.25 Ca EDW 81.5 right IJ TDC heparin 3000 with 3000 mid treatment dose no ESA or Fe calctiriol 0.75 Recent labs: hgb 11.4  ipTH 313  Assessment/Plan: 1. R great toe osteomyelitis (s/p 1st ray amp 3/27): Wound vac in place, followed by Dr. Sharol Given. Initially on Vanc/Ceftaz - now Vanc alone x 2 week course. VVS following as well, for revascularization next week. 2.  ESRD: Will continue per usual TTS schedule - next 3/31. 3.  Hypertension/volume: BP stable, below prior EDW - will change on d/c. 4.  Anemia: Hgb 11.6, no ESA needed  for now. 5.  Metabolic bone disease: Labs to goal, continue VDRA/phoslo 6.  Nutrition: Alb low, adding pro-stat supps. 7.  Lack of permanent dialysis access - seen 10/31/18 - needs f'gram before proceding - was planned for 2/11 but it was cancelled after waiting all day due to a vascular emergency - transportation is a huge barrier for her living in [facility]; it would be nice if we could do this while she is here.   8. Hx of bed bugs  9. Hx CVA/CAD 10. Hx DVT: On warfarin as outpatient, on hold with heparin drip here d/t upcoming procedures.  Veneta Penton, PA-C 12/24/2018, 9:32 AM  [facility] Pager: [phone]  I have seen and examined this patient and agree with plan and assessment in the above note with renal recommendations/intervention highlighted.  Feels well and will have angiogram Wednesday per VVS.  Continue with HD qTTS for now.  Hopefully they can place vascular access at that time as well. [place] John A Tawonna Esquer,MD 12/24/2018 11:48 AM

## 2018-12-24 NOTE — Evaluation (Signed)
Physical Therapy Evaluation Patient Details Name: Kristen Berry MRN: [id number] DOB: 07-Aug-1956 Today's Date: 12/24/2018   History of Present Illness  Pt is a 63 y/o female s/p Right foot 1st ray amputation. PMH includes ESRD on HD, provoked bilateral DVT after surgery, type 2 diabetes, MGUS, and  HFpEF   Clinical Impression   Patient is s/p above surgery resulting in functional limitations due to the deficits listed below (see PT Problem List). Managing independently with Rollator RW prior to admission; Presents with decr functional mobility, difficultly maintaining, decr knowledge of use of DME;  Patient will benefit from skilled PT to increase their independence and safety with mobility to allow discharge to the venue listed below.       Follow Up Recommendations Home health PT;Supervision/Assistance - 24 hour    Equipment Recommendations  Rolling walker with 5" wheels;Wheelchair (measurements PT);Wheelchair cushion (measurements PT)    Recommendations for Other Services OT consult(as ordered)     Precautions / Restrictions Precautions Precautions: Fall Restrictions RLE Weight Bearing: Non weight bearing      Mobility  Bed Mobility Overal bed mobility: Modified Independent             General bed mobility comments: No difficulty getting up; HOB slightly elevated  Transfers Overall transfer level: Needs assistance Equipment used: Rolling walker (2 wheeled) Transfers: Sit to/from [facility] Sit to Stand: Mod assist Stand pivot transfers: Mod assist       General transfer comment: Heavy mod assist to power up and to steady RW; cues for hand placement and safety; Heavy dependence on UEs to push up; Once up, able to support self with RW to Heel-toe pivot on LLE bed to recliner  Ambulation/Gait                Stairs            Wheelchair Mobility    Modified Rankin (Stroke Patients Only)       Balance Overall balance assessment:  Needs assistance   Sitting balance-Leahy Scale: Good     Standing balance support: Bilateral upper extremity supported;During functional activity Standing balance-Leahy Scale: Poor Standing balance comment: Dependent on UE support                             Pertinent Vitals/Pain Pain Assessment: No/denies pain(Had been premedicated for pain)    Home Living Family/patient expects to be discharged to:: Private residence Living Arrangements: Children Available Help at Discharge: Family;Available 24 hours/day Type of Home: Mobile home Home Access: Ramped entrance     Home Layout: One level Home Equipment: [place] - 4 wheels;Bedside commode;Tub bench Additional Comments: Also has a lift chair that she would rather not have to sue the lift function; she sleeps in her lift chair    Prior Function Level of Independence: Independent with assistive device(s)         Comments: Rollator for ambulation.  Goes to HD 3 x week. Uses RTAS to go to HD     Hand Dominance   Dominant Hand: Right    Extremity/Trunk Assessment   Upper Extremity Assessment Upper Extremity Assessment: Defer to OT evaluation;Generalized weakness    Lower Extremity Assessment Lower Extremity Assessment: Generalized weakness;RLE deficits/detail RLE Deficits / Details: Vac dressing in place at forefoot; hip, knee AROM WFL; Difficulty maintaining NWB       Communication   Communication: No difficulties  Cognition Arousal/Alertness: Awake/alert Behavior During Therapy: [facility]  for tasks assessed/performed Overall Cognitive Status: Within Functional Limits for tasks assessed                                        General Comments General comments (skin integrity, edema, etc.): Very motivated to get better    Exercises     Assessment/Plan    PT Assessment Patient needs continued PT services  PT Problem List Decreased strength;Decreased activity tolerance;Decreased  balance;Decreased mobility;Decreased coordination;Decreased knowledge of use of DME;Decreased safety awareness;Decreased knowledge of precautions;Pain((no pain on eval, but required pain meds))       PT Treatment Interventions DME instruction;Gait training;Functional mobility training;Therapeutic activities;Therapeutic exercise;Balance training;Patient/family education;Wheelchair mobility training    PT Goals (Current goals can be found in the Care Plan section)  Acute Rehab PT Goals Patient Stated Goal: to get home, and she hopes to not need wc for very long PT Goal Formulation: With patient Time For Goal Achievement: 01/07/19 Potential to Achieve Goals: Good Additional Goals Additional Goal #1: Pt will manage wheeclchair parts, and propulsion including turns with Supervision and min cues    Frequency Min 3X/week   Barriers to discharge Inaccessible home environment Likely wheelchair will not fit into bathroom    Co-evaluation               AM-PAC PT "6 Clicks" Mobility  Outcome Measure Help needed turning from your back to your side while in a flat bed without using bedrails?: None Help needed moving from lying on your back to sitting on the side of a flat bed without using bedrails?: None Help needed moving to and from a bed to a chair (including a wheelchair)?: A Little Help needed standing up from a chair using your arms (e.g., wheelchair or bedside chair)?: A Lot Help needed to walk in hospital room?: A Lot Help needed climbing 3-5 steps with a railing? : Total 6 Click Score: 16    End of Session Equipment Utilized During Treatment: Gait belt Activity Tolerance: Patient tolerated treatment well Patient left: in chair;with call bell/phone within reach Nurse Communication: Mobility status PT Visit Diagnosis: Unsteadiness on feet (R26.81);Other abnormalities of gait and mobility (R26.89);Difficulty in walking, not elsewhere classified (R26.2)    Time: 0370-4888 PT  Time Calculation (min) (ACUTE ONLY): 32 min   Charges:   PT Evaluation $PT Eval Moderate Complexity: 1 Mod PT Treatments $Therapeutic Activity: 8-22 mins        Roney Marion, PT  Acute Rehabilitation Services Pager [phone] Office [place] 12/24/2018, 9:22 AM

## 2018-12-25 ENCOUNTER — Inpatient Hospital Stay (HOSPITAL_COMMUNITY): Payer: Medicare HMO

## 2018-12-25 LAB — BASIC METABOLIC PANEL
Anion gap: 15 (ref 5–15)
BUN: 35 mg/dL — ABNORMAL HIGH (ref 8–23)
CO2: 22 mmol/L (ref 22–32)
Calcium: 8.9 mg/dL (ref 8.9–10.3)
Chloride: 93 mmol/L — ABNORMAL LOW (ref 98–111)
Creatinine, Ser: 4.31 mg/dL — ABNORMAL HIGH (ref 0.44–1.00)
GFR calc Af Amer: 12 mL/min — ABNORMAL LOW (ref >60)
GFR calc non Af Amer: 10 mL/min — ABNORMAL LOW (ref >60)
Glucose, Bld: 97 mg/dL (ref 70–99)
Potassium: 4.2 mmol/L (ref 3.5–5.1)
Sodium: 130 mmol/L — ABNORMAL LOW (ref 135–145)

## 2018-12-25 LAB — CBC
HCT: 34.8 % — ABNORMAL LOW (ref 36.0–46.0)
Hemoglobin: 11.1 g/dL — ABNORMAL LOW (ref 12.0–15.0)
MCH: 32.6 pg (ref 26.0–34.0)
MCHC: 31.9 g/dL (ref 30.0–36.0)
MCV: 102.1 fL — ABNORMAL HIGH (ref 80.0–100.0)
Platelets: 488 10*3/uL — ABNORMAL HIGH (ref 150–400)
RBC: 3.41 MIL/uL — ABNORMAL LOW (ref 3.87–5.11)
RDW: 14.4 % (ref 11.5–15.5)
WBC: 11.4 10*3/uL — ABNORMAL HIGH (ref 4.0–10.5)
nRBC: 0 % (ref 0.0–0.2)

## 2018-12-25 LAB — GLUCOSE, CAPILLARY
Glucose-Capillary: 81 mg/dL (ref 70–99)
Glucose-Capillary: 131 mg/dL — ABNORMAL HIGH (ref 70–99)
Glucose-Capillary: 128 mg/dL — ABNORMAL HIGH (ref 70–99)

## 2018-12-25 LAB — HEPARIN LEVEL (UNFRACTIONATED)
Heparin Unfractionated: 0.26 IU/mL — ABNORMAL LOW (ref 0.30–0.70)
Heparin Unfractionated: <0.1 IU/mL — ABNORMAL LOW (ref 0.30–0.70)

## 2018-12-25 MED ORDER — HEPARIN (PORCINE) 25000 UT/250ML-% IV SOLN
1400.0000 [IU]/h | INTRAVENOUS | Status: DC
  Administered 2018-12-25 – 2018-12-26 (×2): 1450 [IU]/h via INTRAVENOUS
  Administered 2018-12-27: 1300 [IU]/h via INTRAVENOUS
  Filled 2018-12-25 (×2): qty 250

## 2018-12-25 MED ORDER — CHLORHEXIDINE GLUCONATE CLOTH 2 % EX PADS
6.0000 | MEDICATED_PAD | Freq: Every day | CUTANEOUS | Status: DC
  Administered 2018-12-26 – 2018-12-27 (×2): 6 via TOPICAL

## 2018-12-25 MED ORDER — GABAPENTIN 100 MG PO CAPS
100.0000 mg | ORAL_CAPSULE | Freq: Every day | ORAL | Status: DC
  Administered 2018-12-25 – 2018-12-30 (×6): 100 mg via ORAL
  Filled 2018-12-25 (×6): qty 1

## 2018-12-25 MED ORDER — OXYCODONE HCL 5 MG PO TABS
2.5000 mg | ORAL_TABLET | Freq: Four times a day (QID) | ORAL | Status: DC | PRN
  Filled 2018-12-25: qty 1

## 2018-12-25 MED ORDER — OXYCODONE HCL 5 MG PO TABS
5.0000 mg | ORAL_TABLET | Freq: Four times a day (QID) | ORAL | Status: DC | PRN
  Administered 2018-12-25 – 2018-12-27 (×5): 5 mg via ORAL
  Filled 2018-12-25 (×6): qty 1

## 2018-12-25 NOTE — Progress Notes (Addendum)
Family Medicine Teaching Service Daily Progress Note Intern Pager: [phone]  Patient name: Kristen Berry Medical record number: [id number] Date of birth: 11/21/1955 Age: 63 y.o. Gender: female  Primary Care Provider: [place], Sarah T, MD Consultants: Nephrology, orthopedics Code Status: DNR  Pt Overview and Major Events to Date:  12/21/2018 - Admitted 12/22/2018 - Right great toe amputation  Assessment and Plan: Kristen Berry is a 63 y.o. female presenting with osteomyelitis of the right great toe.[name] PMH is significant for osteomyelitis of the left toes s/p amputation, ESRD, diabetes type 2, hypertension, history of DVT, history of stroke, CHF, MGUS  Osteomyelitis of right first toe, day #3 s/p amputation: Pain well controlled this morning, oxy 2.5- 5mg  prn q6. Right foot is wrapped with wound VAC in place.   -Ortho consulted; Follow up with Dr. Sharol Given in one week. -Continue heparin drip, Brilinta, and Trental leading up to arteriography/fistulogram on Wednesday 4/1 -Oxycodone 2.5-5mg  q6 prn, tylenol scheduled -Continue Vancomycin TTS with HD for 2 weeks -Daily weights/ I&O -Vitals per routine -PT/OT recommending HH - patient desires outpatient PT, preferably in or around [place] -f/u ABIs 03/30  Right leg cellulitis, improved: foot wrapped with wound vac in place today. She has remained afebrile and WBC 13.8 slightly increased from previous 11.5. Recommend continuting Vanc with dialysis. Will treat for MRSA given she has had it in the past.  - Cont Vancomycin with dialysis for 2 weeks. Please place OPAT order on discharge. - Cont to monitor regression of rash.  ESRD on HD TThS, chronic, stable: HD tomorrow 03/31.  Creatinine is 1.83>2.6>3.8>3.2>4.3 on 03/30, GFR is 29.  Calcium 8.8, albumin 3.4. Other electrolytes normal. -Nephro consulted, appreciate recs -Renally dose medications  -Continue PhosLo  Diabetes mellitus type 2: A1c 5.5% 12/22/2018. Diet controlled. -sSSI, CBGs q4  hours  HFrEF: January 2019 patient had echo that showed LVEF 35 to 40%.  Septal and apical akinesis, moderately dilated LV, mild LVH. -Continue HD. No other tx at this time.  CAD: left heart cath in February 2019 showed severe diffuse diabetic coronary disease, 70 to 80% stenosis of proximal LAD 75% obstruction of RCA -Continue heparin drip, Brilinta, and Trental  Mild asthma: since childhood. Hasn't used her albuterol in months. No other medications. -Hold albuterol  HTN, chronic, stable: BP 118/46. Takes amlodipine 5mg , Coreg 25mg  BID, hydralazine 25mg  BID at home. -Continue home meds  HLD: pt takes 20mg  lipitor at home. Last Lipid panel January 2019 with Trig 180, HDL 39, and LDL 109. -Continue Lipitor  MGUS: diagnosed 2017.  Patient not currently on any medication.  Seen by [facility]. Alvy Bimler [facility].  History of left leg DVT: pt takes warfarin 4mg  TTSS/5mg  MWF.  Patient has history of DVT in left leg. Unknown when she first started warfarin, has been at least 1 year.  Patient currently subtherapeutic with INR of 1.6.  -Continue Brilinta, Trental, heparin gtt; hold Warfarin for bridging s/p Arteriography/Fistulogram Wednesday 04/01   Hx of stroke: patient states she had a stroke April 2019. Was treated at [facility] in [place]. Does not appear to have any residual deficits.  Patient takes Brilinta 90 mg twice daily.  -Continue Brilinta, Trental, heparin gtt; hold Warfarin s/p Arteriography/Fistulogram Wednesday 04/01   FEN/GI: Renal w/ fluid restricted Prophylaxis: SubQ heparin, Brilinta, Trental  Disposition: Discharge once stable s/p Arteriography/Fistulogram on 04/01  Subjective:  Patient is relaxing sitting upright in her bed.  States she worked with PT/OT yesterday and that she it was  very difficult for her to walk on her right foot.  She is not interested in home health as she lives in a very small mobile home where she does not have much room  to move around with physical therapy.  Transportation is also an issue with this patient and she would need physical therapy outpatient in/around [name]) so she could use the local transportation.  Objective: Temp:  [97.6 F (36.4 C)-98 F (36.7 C)] 97.7 F (36.5 C) (03/30 0558) Pulse Rate:  [68-95] 68 (03/30 0558) Resp:  [17-18] 17 (03/30 0558) BP: (98-118)/(46-58) 118/46 (03/30 0558) SpO2:  [100 %] 100 % (03/30 0558) Weight:  [79.8 kg] 79.8 kg (03/30 0500) Physical Exam: Gen: No apparent distress, pleasant CV: RRR, S1-S2 present, no murmurs, rubs, or gallops Resp: CTA bilaterally, no respiratory distress  Abd: Nontender to palpation, nondistended, bowel sounds in 4 quadrants  Right Foot: wrapped with wound vac in place, no evidence of infection or DVT Ext: Right first toe and left second and third toes S/P amputation, no clubbing, cyanosis, edema, or evidence of DVT   Laboratory: Recent Labs  Lab 12/23/18 0743 12/24/18 0500 12/25/18 0548  WBC 13.8* 13.8* 11.4*  HGB 11.9* 11.6* 11.1*  HCT 37.3 36.3 34.8*  PLT 582* 538* 488*   Recent Labs  Lab 12/21/18 1732  12/23/18 0743 12/24/18 0500 12/25/18 0548  NA 135   < > 132* 131* 130*  K 3.7   < > 3.9 3.8 4.2  CL 98   < > 98 93* 93*  CO2 25   < > 23 22 22   BUN 7*   < > 27* 20 35*  CREATININE 1.83*   < > 3.81* 3.19* 4.31*  CALCIUM 8.8*   < > 9.0 9.1 8.9  PROT 8.1  --   --   --   --   BILITOT 0.5  --   --   --   --   ALKPHOS 108  --   --   --   --   ALT 10  --   --   --   --   AST 16  --   --   --   --   GLUCOSE 90   < > 152* 129* 97   < > = values in this interval not displayed.   Imaging/Diagnostic Tests: Dg Foot Complete Right  Result Date: 12/21/2018 CLINICAL DATA:  Pt presents with necrotic R big toe, reports 1 week h/o of redness, drainage with foul odor. Pt states the pain radiates from her big toe up into her lower leg, right foot and lower leg has associated swelling and redness. Pt denies any  injury to right foot. Hx of diabetes and PVD. EXAM: RIGHT FOOT COMPLETE - 3+ VIEW COMPARISON:  None. FINDINGS: There is resorption of a portion of the distal tuft, along its plantar lateral margin, and along the lateral aspect of the proximal to mid aspect of the great toe distal phalanx, findings consistent with osteomyelitis. There are no other areas of bone resorption to suggest additional osteomyelitis. No fracture.  Joints are normally aligned. There is soft tissue swelling of the great toe without soft tissue air. Large dorsal and plantar calcaneal spurs. IMPRESSION: 1. Osteomyelitis of the distal phalanx of the right great toe involving the distal tuft and lateral aspect of the proximal to mid phalanx. 2. There is surrounding soft tissue swelling, without soft tissue air. 3. No fracture, dislocation or other evidence of osteomyelitis.  Electronically Signed   By: Lajean Manes M.D.   On: 12/21/2018 18:01   Daisy Floro, DO 12/25/2018, 8:47 AM PGY-2, [place] Intern pager: [phone], text pages welcome

## 2018-12-25 NOTE — Progress Notes (Signed)
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 PMH includes ESRD on HD, provoked bilateral DVT after surgery, type 2 diabetes, MGUS, and  HFpEF    OT comments  Pt progressing towards established OT goals. Pt highly motivated to participate in therapy. Pt requiring Min A for power up from elevate surface and to pivot to [facility]. Pt demonstrating decreased strength and balance. Pt also demonstrating poor RW management and adherence to WB status. Continue to recommend dc to home with HHOT if she has 24/7 support. Will continue to follow acutely as admitted.    Follow Up Recommendations  Home health OT;Supervision/Assistance - 24 hour    Equipment Recommendations  Other (comment)(to be determined)    Recommendations for Other Services PT consult    Precautions / Restrictions Precautions Precautions: Fall Restrictions Weight Bearing Restrictions: Yes RLE Weight Bearing: Non weight bearing       Mobility Bed Mobility Overal bed mobility: Modified Independent             General bed mobility comments: No difficulty getting up; HOB slightly elevated  Transfers Overall transfer level: Needs assistance Equipment used: Rolling walker (2 wheeled) Transfers: Sit to/from [facility] Sit to Stand: Min assist;From elevated surface Stand pivot transfers: Min assist       General transfer comment: Min A to power up from elevated surface. Pt with poor strength at [place] and requiring MIn A for balancing on LLE only. Pt performing heel toe to pivot to [facility]    Balance Overall balance assessment: Needs assistance   Sitting balance-Leahy Scale: Good     Standing balance support: Bilateral upper extremity supported;During functional activity Standing balance-Leahy Scale: Poor Standing balance comment: Dependent  on UE support                           ADL either performed or assessed with clinical judgement   ADL Overall ADL's : Needs assistance/impaired                         Toilet Transfer: Minimal assistance;Stand-pivot;BSC Toilet Transfer Details (indicate cue type and reason): Min A for balance and use of RW to pivot to [facility]. Pt with decreased balance. Difficulty maintaining NWB for RLE and sometimes presenting at TDBW status. Cues for NWBing. Toileting- Clothing Manipulation and Hygiene: Sitting/lateral lean;Sit to/from stand;Minimal assistance Toileting - Clothing Manipulation Details (indicate cue type and reason): Educating pt on techniques for lateral leaning to clean self after toileting.      Functional mobility during ADLs: Rolling walker;Cueing for safety;Minimal assistance;Cueing for sequencing(stand pivot only) General ADL Comments: Pt requiring increased assist for ADL and mobility due to NWB RLE.     Vision Baseline Vision/History: Wears glasses Vision Assessment?: No apparent visual deficits   Perception     Praxis      Cognition Arousal/Alertness: Awake/alert Behavior During Therapy: WFL for tasks assessed/performed Overall Cognitive Status: Within Functional Limits for tasks assessed                                          Exercises     Shoulder Instructions       General Comments Highly  motivated.     Pertinent Vitals/ Pain       Pain Assessment: No/denies pain  Home Living Family/patient expects to be discharged to:: Private residence Living Arrangements: Children Available Help at Discharge: Family;Available 24 hours/day Type of Home: Mobile home Home Access: Ramped entrance     Home Layout: One level     Bathroom Shower/Tub: Tub/shower unit;Curtain   Bathroom Toilet: Handicapped height     Home Equipment: Environmental consultant - 4 wheels;Bedside commode;Tub bench   Additional Comments: Also has a lift chair that  she would rather not have to [place] the lift function; she sleeps in her lift chair      Prior Functioning/Environment Level of Independence: Independent with assistive device(s)        Comments: Rollator for ambulation.  Goes to HD 3 x week. Uses RTAS to go to HD   Frequency  Min 2X/week        Progress Toward Goals  OT Goals(current goals can now be found in the care plan section)  Progress towards OT goals: Progressing toward goals  Acute Rehab OT Goals Patient Stated Goal: to get home, and she hopes to not need wc for very long OT Goal Formulation: With patient Time For Goal Achievement: 01/07/19 Potential to Achieve Goals: Good ADL Goals Pt Will Perform Lower Body Dressing: with modified independence;with adaptive equipment Pt/caregiver will Perform Home Exercise Program: Both right and left upper extremity;With written HEP provided;Independently Additional ADL Goal #1: Pt will transfer with modified independence on various surfaces in preparation for DC home.  Plan Discharge plan remains appropriate;Frequency remains appropriate;Equipment recommendations need to be updated    Co-evaluation                 AM-PAC OT "6 Clicks" Daily Activity     Outcome Measure   Help from another person eating meals?: None Help from another person taking care of personal grooming?: None Help from another person toileting, which includes using toliet, bedpan, or urinal?: A Lot Help from another person bathing (including washing, rinsing, drying)?: A Lot Help from another person to put on and taking off regular upper body clothing?: A Little Help from another person to put on and taking off regular lower body clothing?: A Lot 6 Click Score: 17    End of Session Equipment Utilized During Treatment: Rolling walker  OT Visit Diagnosis: Unsteadiness on feet (R26.81);Muscle weakness (generalized) (M62.81);Other abnormalities of gait and mobility (R26.89);Pain Pain - Right/Left:  Right Pain - part of body: Ankle and joints of foot   Activity Tolerance Patient tolerated treatment well;Patient limited by pain   Patient Left with call bell/phone within reach;with bed alarm set(BSC with call bell near to call when finished)   Nurse Communication Mobility status;Other (comment)(Wound Vac charger in pt's personal belongings)        Time: 7408-1448 OT Time Calculation (min): 22 min  Charges: OT General Charges $OT Visit: 1 Visit OT Treatments $Self Care/Home Management : 8-22 mins  [place], OTR/L Acute Rehab Pager: [phone] Office: [place] 12/25/2018, 11:22 AM

## 2018-12-25 NOTE — Progress Notes (Signed)
[place] for Heparin Indication: h/o DVT and CVA   Allergies  Allergen Reactions  . Crestor [Rosuvastatin Calcium] Other (See Comments)    Leg pain    Patient Measurements: Height: 5\' 8"  (172.7 cm) Weight: 175 lb 14.8 oz (79.8 kg) IBW/kg (Calculated) : 63.9 Heparin Dosing Weight: 80kg  Vital Signs: Temp: 97.7 F (36.5 C) (03/30 0900) Temp Source: Oral (03/30 0900) BP: 110/42 (03/30 0900) Pulse Rate: 70 (03/30 0900)  Labs: Recent Labs    12/23/18 0743  12/23/18 1626 12/24/18 0500 12/25/18 0548  HGB 11.9*  --   --  11.6* 11.1*  HCT 37.3  --   --  36.3 34.8*  PLT 582*  --   --  538* 488*  HEPARINUNFRC  --    < > 0.60 0.42 0.26*  CREATININE 3.81*  --   --  3.19* 4.31*   < > = values in this interval not displayed.    Estimated Creatinine Clearance: 14.8 mL/min (A) (by C-G formula based on SCr of 4.31 mg/dL (H)).   Medical History: Past Medical History:  Diagnosis Date  . Anemia   . Arthritis    "hands, knees" (10/10/2017)  . Asthma   . CHF (congestive heart failure) ([place])   . Chronic kidney disease (CKD), stage IV (severe) ([place])    Dialysis T/ Th/ Sat  . Coronary artery disease   . H/O Clostridium difficile infection   . High cholesterol   . History of kidney stones   . Hypertension   . PVD (peripheral vascular disease) ([place])    "LLE; will have OR" (10/10/2017)  . Sleep apnea    "never given mask" (10/10/2017)  . Stroke [facility])    mild stroke mid April - 2019  . Type II diabetes mellitus ([place])    "no RX anymore" (10/10/2017)    Assessment:  56 yoF with h/o DVT and CVA s/p R great toe amputation, on Coumadin PTA but currently on hold in anticipation of angiography for next week. Pharmacy consulted to dose IV heparin.   3/30: AM Heparin level now subtherapeutic this AM at 0.26. CBC stable, no s/sx of bleeding noted or infusion issues per RN.  Goal of Therapy:  Heparin level 0.3-0.7 units/ml  Monitor platelets by  anticoagulation protocol: Yes   Plan:  Increase heparin drip to 1450 units/hr Heparin level today at 1730 Warfarin on hold  Daily HL, CBC, and INR Monitor s/sx of bleeding  Janae Bridgeman, PharmD PGY1 Pharmacy Resident Phone: [phone] 12/25/2018 9:21 AM

## 2018-12-25 NOTE — Progress Notes (Signed)
[place] for Heparin Indication: h/o DVT and CVA   Allergies  Allergen Reactions  . Crestor [Rosuvastatin Calcium] Other (See Comments)    Leg pain    Patient Measurements: Height: 5\' 8"  (172.7 cm) Weight: 175 lb 14.8 oz (79.8 kg) IBW/kg (Calculated) : 63.9 Heparin Dosing Weight: 80kg  Vital Signs: Temp: 97.5 F (36.4 C) (03/30 1620) Temp Source: Oral (03/30 1620) BP: 118/53 (03/30 1620) Pulse Rate: 86 (03/30 1620)  Labs: Recent Labs    12/23/18 0743  12/24/18 0500 12/25/18 0548 12/25/18 1706  HGB 11.9*  --  11.6* 11.1*  --   HCT 37.3  --  36.3 34.8*  --   PLT 582*  --  538* 488*  --   HEPARINUNFRC  --    < > 0.42 0.26* <0.10*  CREATININE 3.81*  --  3.19* 4.31*  --    < > = values in this interval not displayed.    Estimated Creatinine Clearance: 14.8 mL/min (A) (by C-G formula based on SCr of 4.31 mg/dL (H)).   Medical History: Past Medical History:  Diagnosis Date  . Anemia   . Arthritis    "hands, knees" (10/10/2017)  . Asthma   . CHF (congestive heart failure) ([place])   . Chronic kidney disease (CKD), stage IV (severe) ([place])    Dialysis T/ Th/ Sat  . Coronary artery disease   . H/O Clostridium difficile infection   . High cholesterol   . History of kidney stones   . Hypertension   . PVD (peripheral vascular disease) ([place])    "LLE; will have OR" (10/10/2017)  . Sleep apnea    "never given mask" (10/10/2017)  . Stroke [facility])    mild stroke mid April - 2019  . Type II diabetes mellitus ([place])    "no RX anymore" (10/10/2017)    Assessment:  64 yoF with h/o DVT and CVA s/p R great toe amputation, on Coumadin PTA but currently on hold in anticipation of angiography for next week. Pharmacy consulted to dose IV heparin.   Heparin was stopped earlier today so last heparin level was undetectable. Discussed with FMTS and heparin will be restarted.  Goal of Therapy:  Heparin level 0.3-0.7 units/ml  Monitor platelets by  anticoagulation protocol: Yes   Plan:  Restart heparin drip at1450 units/hr Warfarin on hold  Monitor daily heparin level, CBC, s/s of bleed  Elenor Quinones, PharmD, BCPS, BCIDP Clinical Pharmacist 12/25/2018 7:10 PM

## 2018-12-25 NOTE — Progress Notes (Signed)
Patient ID: Kristen Berry, female   DOB: 08/30/1956, 63 y.o.   MRN: [id number] Patient is a 63 year old woman status post right first ray amputation for gangrene and abscess of the right first ray.  The wound VAC is functioning well there is no drainage in the wound VAC canister.  Discussed with the patient importance of plugging the unit in to maintain its charge.  Discussed the patient did have a 3 mm area of ischemia over the medial aspect of the second toe.  Anticipate vascular studies with possible intervention on Wednesday.

## 2018-12-25 NOTE — Progress Notes (Signed)
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 PMH includes ESRD on HD, provoked bilateral DVT after surgery, type 2 diabetes, MGUS, and  HFpEF     PT Comments    Continuing work on functional mobility and activity tolerance;  Her R foot was more painful this session, but she performed transfer recliner back to bed; still with difficulty maintaining WB status with standing and transfers; We discussed using a wheelchair for the predominance of her mobility needs and she voiced understanding; she also has her lift chair, which can help initially   Follow Up Recommendations  Home health PT;Supervision/Assistance - 24 hour     Equipment Recommendations  Rolling walker with 5" wheels;Wheelchair (measurements PT);Wheelchair cushion (measurements PT)    Recommendations for Other Services       Precautions / Restrictions Precautions Precautions: Fall Restrictions Weight Bearing Restrictions: Yes RLE Weight Bearing: Non weight bearing    Mobility  Bed Mobility Overal bed mobility: Modified Independent             General bed mobility comments: No difficulty getting LEs into bed  Transfers Overall transfer level: Needs assistance Equipment used: Rolling walker (2 wheeled) Transfers: Sit to/from [facility] Sit to Stand: Mod assist Stand pivot transfers: Min assist       General transfer comment: Mod assist to power up from recliner; verbal and demonstrational cues for sit to stand; cues to keep RW close for better support during heel-toe pivot on LLE  Ambulation/Gait                 Stairs             Wheelchair Mobility    Modified Rankin (Stroke Patients Only)       Balance Overall balance assessment: Needs assistance   Sitting balance-Leahy Scale: Good     Standing  balance support: Bilateral upper extremity supported;During functional activity Standing balance-Leahy Scale: Poor Standing balance comment: Dependent on UE support                            Cognition Arousal/Alertness: Awake/alert Behavior During Therapy: WFL for tasks assessed/performed Overall Cognitive Status: Within Functional Limits for tasks assessed                                        Exercises General Exercises - Upper Extremity Chair Push Up: Strengthening;Both;10 reps;Seated    General Comments General comments (skin integrity, edema, etc.): Highly motivated; we discussed the need for a wheelchair at this point for mobility to keep weight off of R foot; Told her it is possible to remove armrest of wheelchair and she can scoot to and from wheelchair      Pertinent Vitals/Pain Pain Assessment: Faces Faces Pain Scale: Hurts even more Pain Location: R foot Pain Descriptors / Indicators: Aching Pain Intervention(s): Premedicated before session;Monitored during session    Home Living                      Prior Function            PT Goals (current goals can now be found in the care plan section) Acute Rehab PT Goals Patient  Stated Goal: to get home, and she hopes to not need wc for very long PT Goal Formulation: With patient Time For Goal Achievement: 01/07/19 Potential to Achieve Goals: Good Progress towards PT goals: Progressing toward goals    Frequency    Min 3X/week      PT Plan Current plan remains appropriate    Co-evaluation              AM-PAC PT "6 Clicks" Mobility   Outcome Measure  Help needed turning from your back to your side while in a flat bed without using bedrails?: None Help needed moving from lying on your back to sitting on the side of a flat bed without using bedrails?: None Help needed moving to and from a bed to a chair (including a wheelchair)?: A Little Help needed standing up from  a chair using your arms (e.g., wheelchair or bedside chair)?: A Lot Help needed to walk in hospital room?: A Lot Help needed climbing 3-5 steps with a railing? : Total 6 Click Score: 16    End of Session Equipment Utilized During Treatment: Gait belt Activity Tolerance: Patient tolerated treatment well Patient left: in bed;with call bell/phone within reach Nurse Communication: Mobility status PT Visit Diagnosis: Unsteadiness on feet (R26.81);Other abnormalities of gait and mobility (R26.89);Difficulty in walking, not elsewhere classified (R26.2)     Time: 7169-6789 PT Time Calculation (min) (ACUTE ONLY): 9 min  Charges:  $Therapeutic Activity: 8-22 mins                     Roney Marion, PT  Acute Rehabilitation Services Pager [phone] Office [place] 12/25/2018, 3:53 PM

## 2018-12-25 NOTE — Progress Notes (Signed)
Initial Nutrition Assessment  RD working remotely.  DOCUMENTATION CODES:   Not applicable  INTERVENTION:   - Encourage PO intake of protein-rich foods   NUTRITION DIAGNOSIS:   Increased nutrient needs related to wound healing as evidenced by estimated needs.  GOAL:   Patient will meet greater than or equal to 90% of their needs  MONITOR:   PO intake, Weight trends, Labs, Skin, I & O's  REASON FOR ASSESSMENT:   Other (Comment)(wound vac)    ASSESSMENT:   63 yo female, admitted with osteomyelitis of R great toe. PMH significant for ESRD on HD, provoked DVT after surgery, DM type 2, HTN, CHF, h/o kidney stones, hypercholesterolemia, h/o stroke, anemia, h/o C. Diff, CAD.  Labs: sodium 130 (L), chloride 93 (L), BUN 35 (H), creatinine 4.31 (H), phosphorus WNL,  Meds: Lipitor, Phoslo, Colace, Novolog, Rena-Vit, Zofran PRN  3/28 HD I/O: -2000 mL Weight:  [80.8 kg-81.9 kg] 81.9 kg (03/28 0730) Weight change: -4.214 kg  RD spoke with pt over the phone. Pt endorses poor appetite and has been eating less than usual, unsure of time frame. BM today. Pt declined oral nutrition supplement at this time. Encouraged pt to include protein-rich foods with all meals and snacks, and to eat those foods first if not feeling hungry. Will continue to monitor adequacy of intake.  NUTRITION - FOCUSED PHYSICAL EXAM: Deferred - RD working remotely  Diet Order:   3/26 Heart/CHO 3/27 NPO, renal with 1200 mL fluid Diet Order            Diet NPO time specified Except for: Sips with Meds  Diet effective midnight        Diet renal with fluid restriction Fluid restriction: 1200 mL Fluid; Room service appropriate? Yes; Fluid consistency: Thin  Diet effective now            PO Intake Pt averaging 1-2 meals daily, 90-100% complete  EDUCATION NEEDS:  No education needs have been identified at this time  Skin:  Skin Assessment: Skin Integrity Issues: Skin Integrity Issues:: Other (Comment),  Wound VAC Wound Vac: Necrotic R great toe Other: Cellulitis - BL legs  Last BM:  3/25  Height:  Ht Readings from Last 1 Encounters:  12/22/18 5\' 8"  (1.727 m)    Weight:  Wt Readings from Last 10 Encounters:  12/25/18 79.8 kg  11/07/18 81.6 kg  10/31/18 80.7 kg  06/23/18 81.1 kg  1.3 kg wt loss x 6 months = 1.5% not clinically significant  Edema: +1 RLE  Ideal Body Weight:  63.6 kg  BMI:  Body mass index is 26.75 kg/m., overweight  Estimated Nutritional Needs: calculated based on estimated dry wt (~81 kg)  Kcal:  6979-4801 (30-35 kcal/kg)  Protein:  >97 gm (>1.2 g/kg)  Fluid:  1 L + UOP or per MD  Althea Grimmer, MS, RDN, LDN Pager: [phone] Available Mondays and Fridays, 9am-2pm

## 2018-12-25 NOTE — Procedures (Signed)
   I was present at this dialysis session, have reviewed the session itself and made  appropriate changes [name] MD [place] pager [phone]   12/25/2018, 5:07 PM

## 2018-12-25 NOTE — Progress Notes (Addendum)
[facility] Progress Note   Dialysis Orders: [place] 4 hr  TTS 3 K 2.25 Ca EDW 81.5 right IJ TDC heparin 3000 with 3000 mid treatment dose no ESA or Fe calctiriol 0.75 Recent labs: hgb 11.4  ipTH 313  Assessment/Plan: 1.  R great toe osteomyelitis s/p 1st ray amp 3/27 with VAC -  Dr. Sharol Given -. On Vanc x 2 weeks revascularization- VVS to do arteriorgram Wed 2.  ESRD -  TTS - HD tomorrow  3.  Hypertension/volume  - titrate volume with HD - prone to large IDWG net UF Sat 2 L Na low try for 3 L Tuesday 4.  Anemia  - hgb stable - no ESA 5.  Metabolic bone disease -  continue VDRA/phoslo 6.  Nutrition - renal carb mod diet/vits 7.  Lack of permanent dialysis access - seen 10/31/18 - needs f'gram before proceding - was planned for 2/11 but it was cancelled after waiting all day due to a vascular emergency -planning for f'gram Wed 8. Hx of bed bugs  9. Hx CVA/CAD 10. Hx DVT -on hold due to arteriography  Myriam Jacobson, PA-C [place] [phone] 12/25/2018,9:51 AM  LOS: 4 days   Pt seen, examined and agree w A/P as above.  [facility] 12/25/2018, 5:07 PM    Subjective:   No c/o. No pain.  Objective Vitals:   12/24/18 2102 12/25/18 0500 12/25/18 0558 12/25/18 0900  BP: (!) 118/53  (!) 118/46 (!) 110/42  Pulse: 93  68 70  Resp: 18  17 18   Temp: 97.6 F (36.4 C)  97.7 F (36.5 C) 97.7 F (36.5 C)  TempSrc: Oral  Oral Oral  SpO2: 100%  100% 99%  Weight:  79.8 kg    Height:       Physical Exam General: NAD  Heart: RRR  Lungs: no rales Abdomen: + BS soft NT  Extremities:right foot ray amp with wound VAC  Dialysis Access: TDC right IJ and poorly maturing AVF    Additional Objective Labs: Basic Metabolic Panel: Recent Labs  Lab 12/23/18 0743 12/24/18 0500 12/25/18 0548  NA 132* 131* 130*  K 3.9 3.8 4.2  CL 98 93* 93*  CO2 23 22 22   GLUCOSE 152* 129* 97  BUN 27* 20 35*  CREATININE 3.81* 3.19* 4.31*   CALCIUM 9.0 9.1 8.9  PHOS 4.6 4.0  --    Liver Function Tests: Recent Labs  Lab 12/21/18 1732 12/23/18 0743 12/24/18 0500  AST 16  --   --   ALT 10  --   --   ALKPHOS 108  --   --   BILITOT 0.5  --   --   PROT 8.1  --   --   ALBUMIN 3.4* 3.0* 3.1*   No results for input(s): LIPASE, AMYLASE in the last 168 hours. CBC: Recent Labs  Lab 12/21/18 1732 12/22/18 0313 12/23/18 0743 12/24/18 0500 12/25/18 0548  WBC 12.0* 11.5* 13.8* 13.8* 11.4*  NEUTROABS 8.6*  --   --   --   --   HGB 12.4 11.8* 11.9* 11.6* 11.1*  HCT 38.6 37.5 37.3 36.3 34.8*  MCV 101.6* 102.2* 102.8* 102.0* 102.1*  PLT 602* 563* 582* 538* 488*   Blood Culture    Component Value Date/Time   SDES BLOOD LEFT HAND 10/19/2017 1500   SPECREQUEST  10/19/2017 1500    BOTTLES DRAWN AEROBIC AND ANAEROBIC Blood Culture adequate volume   CULT NO GROWTH 5 DAYS  10/19/2017 1500   REPTSTATUS 10/24/2017 FINAL 10/19/2017 1500    Cardiac Enzymes: No results for input(s): CKTOTAL, CKMB, CKMBINDEX, TROPONINI in the last 168 hours. CBG: Recent Labs  Lab 12/24/18 0733 12/24/18 1130 12/24/18 1615 12/24/18 2103 12/25/18 0719  GLUCAP 111* 118* 131* 139* 81   Iron Studies: No results for input(s): IRON, TIBC, TRANSFERRIN, FERRITIN in the last 72 hours. Lab Results  Component Value Date   INR 1.7 (H) 12/22/2018   INR 1.6 (H) 12/21/2018   INR 1.42 11/07/2018   Studies/Results: No results found. Medications: . sodium chloride    . sodium chloride    . sodium chloride    . sodium chloride    . heparin 1,450 Units/hr (12/25/18 0931)  . vancomycin     . acetaminophen  650 mg Oral Q6H   Or  . acetaminophen  650 mg Rectal Q6H  . amLODipine  5 mg Oral QPM  . atorvastatin  20 mg Oral q1800  . calcitRIOL  0.75 mcg Oral Q T,Th,Sa-HD  . calcium acetate  667 mg Oral BID WC  . carvedilol  25 mg Oral BID  . Chlorhexidine Gluconate Cloth  6 each Topical Q0600  . docusate sodium  100 mg Oral BID  . hydrALAZINE  25 mg  Oral BID  . insulin aspart  0-9 Units Subcutaneous TID WC  . multivitamin  1 tablet Oral QHS  . pentoxifylline  400 mg Oral Q supper  . sodium chloride flush  3 mL Intravenous Q12H  . ticagrelor  90 mg Oral BID

## 2018-12-25 NOTE — Progress Notes (Signed)
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 PMH includes ESRD on HD, provoked bilateral DVT after surgery, type 2 diabetes, MGUS, and  HFpEF    OT comments  Returning to assist pt with toileting after BM as well as continue exercises for UEs. Providing education on toilet hygiene techniques to adhere to WB status. Pt laterally leaning to left and performed toilet hygiene with Min Guard A for safety. Pt requiring Mod A to power up from lower surface and Max cues for RW management. Pt performing 10 reps of chair push ups for UE strengthening. Continue to recommend dc home with HHOT if abel to have 24/7 support and will continue to follow acutely as admitted.    Follow Up Recommendations  Home health OT;Supervision/Assistance - 24 hour    Equipment Recommendations  Other (comment)(to be determined)    Recommendations for Other Services PT consult    Precautions / Restrictions Precautions Precautions: Fall Restrictions Weight Bearing Restrictions: Yes RLE Weight Bearing: Non weight bearing       Mobility Bed Mobility Overal bed mobility: Modified Independent             General bed mobility comments: No difficulty getting up; HOB slightly elevated  Transfers Overall transfer level: Needs assistance Equipment used: Rolling walker (2 wheeled) Transfers: Sit to/from [facility] Sit to Stand: Mod assist Stand pivot transfers: Min assist       General transfer comment: Mod A to power up from lower surface at [facility]. Min A for balacne and requiring Max cues for RW management. Pt with difficulty maintaining WB status.    Balance Overall balance assessment: Needs assistance   Sitting balance-Leahy Scale: Good     Standing balance support: Bilateral upper extremity supported;During functional  activity Standing balance-Leahy Scale: Poor Standing balance comment: Dependent on UE support                           ADL either performed or assessed with clinical judgement   ADL Overall ADL's : Needs assistance/impaired                         Toilet Transfer: Minimal assistance;Stand-pivot;BSC Toilet Transfer Details (indicate cue type and reason): Min A for balance and use of RW to pivot to [facility]. Pt with decreased balance. Difficulty maintaining NWB for RLE and sometimes presenting at TDBW status. Cues for NWBing. Toileting- Clothing Manipulation and Hygiene: Sitting/lateral lean;Min guard;Cueing for sequencing;Cueing for safety Toileting - Clothing Manipulation Details (indicate cue type and reason): Pt performing lateral leaning and able to demosntrate safe toilet hygiene.      Functional mobility during ADLs: Rolling walker;Cueing for safety;Minimal assistance;Cueing for sequencing(stand pivot only) General ADL Comments: Returning to assist pt with toilet hygiene and transfer to recliner from [facility]. Also providing education on exercises for UE strengthing.      Vision Baseline Vision/History: Wears glasses Vision Assessment?: No apparent visual deficits   Perception     Praxis      Cognition Arousal/Alertness: Awake/alert Behavior During Therapy: WFL for tasks assessed/performed Overall Cognitive Status: Within Functional Limits for tasks assessed  Exercises Exercises: General Upper Extremity General Exercises - Upper Extremity Chair Push Up: Strengthening;Both;10 reps;Seated   Shoulder Instructions       General Comments Highly motivated.     Pertinent Vitals/ Pain       Pain Assessment: No/denies pain  Home Living Family/patient expects to be discharged to:: Private residence Living Arrangements: Children Available Help at Discharge: Family;Available 24 hours/day Type of Home:  Mobile home Home Access: Ramped entrance     Home Layout: One level     Bathroom Shower/Tub: Tub/shower unit;Curtain   Bathroom Toilet: Handicapped height     Home Equipment: Environmental consultant - 4 wheels;Bedside commode;Tub bench   Additional Comments: Also has a lift chair that she would rather not have to [place] the lift function; she sleeps in her lift chair      Prior Functioning/Environment Level of Independence: Independent with assistive device(s)        Comments: Rollator for ambulation.  Goes to HD 3 x week. Uses RTAS to go to HD   Frequency  Min 2X/week        Progress Toward Goals  OT Goals(current goals can now be found in the care plan section)  Progress towards OT goals: Progressing toward goals  Acute Rehab OT Goals Patient Stated Goal: to get home, and she hopes to not need wc for very long OT Goal Formulation: With patient Time For Goal Achievement: 01/07/19 Potential to Achieve Goals: Good ADL Goals Pt Will Perform Lower Body Dressing: with modified independence;with adaptive equipment Pt/caregiver will Perform Home Exercise Program: Both right and left upper extremity;With written HEP provided;Independently Additional ADL Goal #1: Pt will transfer with modified independence on various surfaces in preparation for DC home.  Plan Discharge plan remains appropriate;Frequency remains appropriate;Equipment recommendations need to be updated    Co-evaluation                 AM-PAC OT "6 Clicks" Daily Activity     Outcome Measure   Help from another person eating meals?: None Help from another person taking care of personal grooming?: None Help from another person toileting, which includes using toliet, bedpan, or urinal?: A Lot Help from another person bathing (including washing, rinsing, drying)?: A Lot Help from another person to put on and taking off regular upper body clothing?: A Little Help from another person to put on and taking off regular lower  body clothing?: A Lot 6 Click Score: 17    End of Session Equipment Utilized During Treatment: Rolling walker  OT Visit Diagnosis: Unsteadiness on feet (R26.81);Muscle weakness (generalized) (M62.81);Other abnormalities of gait and mobility (R26.89);Pain Pain - Right/Left: Right Pain - part of body: Ankle and joints of foot   Activity Tolerance Patient tolerated treatment well;Patient limited by pain   Patient Left with call bell/phone within reach;with bed alarm set(BSC with call bell near to call when finished)   Nurse Communication Mobility status;Other (comment)(Wound Vac charger in pt's personal belongings)        Time: 6387-5643 OT Time Calculation (min): 13 min  Charges: OT General Charges $OT Visit: 1 Visit OT Treatments $Self Care/Home Management : 8-22 mins  [place], OTR/L Acute Rehab Pager: [phone] Office: [place] 12/25/2018, 12:29 PM

## 2018-12-26 ENCOUNTER — Encounter (HOSPITAL_COMMUNITY): Payer: Self-pay

## 2018-12-26 LAB — CBC
HCT: 33.3 % — ABNORMAL LOW (ref 36.0–46.0)
Hemoglobin: 10.6 g/dL — ABNORMAL LOW (ref 12.0–15.0)
MCH: 32.2 pg (ref 26.0–34.0)
MCHC: 31.8 g/dL (ref 30.0–36.0)
MCV: 101.2 fL — ABNORMAL HIGH (ref 80.0–100.0)
Platelets: 531 10*3/uL — ABNORMAL HIGH (ref 150–400)
RBC: 3.29 MIL/uL — ABNORMAL LOW (ref 3.87–5.11)
RDW: 14.5 % (ref 11.5–15.5)
WBC: 10.9 10*3/uL — ABNORMAL HIGH (ref 4.0–10.5)
nRBC: 0 % (ref 0.0–0.2)

## 2018-12-26 LAB — GLUCOSE, CAPILLARY
Glucose-Capillary: 153 mg/dL — ABNORMAL HIGH (ref 70–99)
Glucose-Capillary: 151 mg/dL — ABNORMAL HIGH (ref 70–99)
Glucose-Capillary: 162 mg/dL — ABNORMAL HIGH (ref 70–99)
Glucose-Capillary: 152 mg/dL — ABNORMAL HIGH (ref 70–99)

## 2018-12-26 LAB — RENAL FUNCTION PANEL
Albumin: 2.9 g/dL — ABNORMAL LOW (ref 3.5–5.0)
Anion gap: 14 (ref 5–15)
BUN: 46 mg/dL — ABNORMAL HIGH (ref 8–23)
CO2: 23 mmol/L (ref 22–32)
Calcium: 9.1 mg/dL (ref 8.9–10.3)
Chloride: 94 mmol/L — ABNORMAL LOW (ref 98–111)
Creatinine, Ser: 5.2 mg/dL — ABNORMAL HIGH (ref 0.44–1.00)
GFR calc Af Amer: 9 mL/min — ABNORMAL LOW (ref >60)
GFR calc non Af Amer: 8 mL/min — ABNORMAL LOW (ref >60)
Glucose, Bld: 132 mg/dL — ABNORMAL HIGH (ref 70–99)
Phosphorus: 4.9 mg/dL — ABNORMAL HIGH (ref 2.5–4.6)
Potassium: 3.7 mmol/L (ref 3.5–5.1)
Sodium: 131 mmol/L — ABNORMAL LOW (ref 135–145)

## 2018-12-26 LAB — HEPATITIS B SURFACE ANTIGEN: Hepatitis B Surface Ag: NEGATIVE

## 2018-12-26 LAB — HEPARIN LEVEL (UNFRACTIONATED)
Heparin Unfractionated: 1.06 IU/mL — ABNORMAL HIGH (ref 0.30–0.70)
Heparin Unfractionated: 0.14 IU/mL — ABNORMAL LOW (ref 0.30–0.70)

## 2018-12-26 MED ORDER — SODIUM CHLORIDE 0.9 % IV SOLN: 100.0000 mL | INTRAVENOUS | Status: DC | PRN

## 2018-12-26 MED ORDER — HEPARIN SODIUM (PORCINE) 1000 UNIT/ML DIALYSIS: 1000.0000 [IU] | INTRAMUSCULAR | Status: DC | PRN

## 2018-12-26 MED ORDER — RAMELTEON 8 MG PO TABS
8.0000 mg | ORAL_TABLET | Freq: Every day | ORAL | Status: DC
  Administered 2018-12-26 – 2018-12-30 (×5): 8 mg via ORAL
  Filled 2018-12-26 (×5): qty 1

## 2018-12-26 MED ORDER — OXYCODONE HCL 5 MG PO TABS
5.0000 mg | ORAL_TABLET | Freq: Once | ORAL | Status: AC
  Administered 2018-12-26: 5 mg via ORAL
  Filled 2018-12-26: qty 1

## 2018-12-26 MED ORDER — SENNOSIDES-DOCUSATE SODIUM 8.6-50 MG PO TABS
1.0000 | ORAL_TABLET | Freq: Every day | ORAL | Status: DC
  Administered 2018-12-26 – 2018-12-30 (×5): 1 via ORAL
  Filled 2018-12-26 (×5): qty 1

## 2018-12-26 MED ORDER — HYDROMORPHONE HCL 1 MG/ML IJ SOLN
0.5000 mg | Freq: Once | INTRAMUSCULAR | Status: AC
  Administered 2018-12-26: 0.5 mg via INTRAVENOUS
  Filled 2018-12-26: qty 1

## 2018-12-26 MED ORDER — PENTAFLUOROPROP-TETRAFLUOROETH EX AERO: 1.0000 "application " | INHALATION_SPRAY | CUTANEOUS | Status: DC | PRN

## 2018-12-26 MED ORDER — ALTEPLASE 2 MG IJ SOLR: 2.0000 mg | Freq: Once | INTRAMUSCULAR | Status: DC | PRN

## 2018-12-26 MED ORDER — HEPARIN SODIUM (PORCINE) 1000 UNIT/ML DIALYSIS: 3000.0000 [IU] | Freq: Once | INTRAMUSCULAR | Status: DC

## 2018-12-26 MED ORDER — OXYCODONE HCL 5 MG PO TABS
ORAL_TABLET | ORAL | Status: AC
  Administered 2018-12-26: 5 mg via ORAL
  Filled 2018-12-26: qty 1

## 2018-12-26 MED ORDER — HEPARIN SODIUM (PORCINE) 1000 UNIT/ML IJ SOLN
INTRAMUSCULAR | Status: AC
  Filled 2018-12-26: qty 3

## 2018-12-26 MED ORDER — LIDOCAINE-PRILOCAINE 2.5-2.5 % EX CREA: 1.0000 "application " | TOPICAL_CREAM | CUTANEOUS | Status: DC | PRN

## 2018-12-26 MED ORDER — POLYETHYLENE GLYCOL 3350 17 G PO PACK
17.0000 g | PACK | Freq: Every day | ORAL | Status: DC
  Administered 2018-12-26 – 2018-12-28 (×2): 17 g via ORAL
  Filled 2018-12-26 (×4): qty 1

## 2018-12-26 MED ORDER — CALCITRIOL 0.5 MCG PO CAPS
ORAL_CAPSULE | ORAL | Status: AC
  Filled 2018-12-26: qty 1

## 2018-12-26 MED ORDER — CALCITRIOL 0.25 MCG PO CAPS
ORAL_CAPSULE | ORAL | Status: AC
  Filled 2018-12-26: qty 1

## 2018-12-26 MED ORDER — LIDOCAINE HCL (PF) 1 % IJ SOLN: 5.0000 mL | INTRAMUSCULAR | Status: DC | PRN

## 2018-12-26 NOTE — [place] FL2 (Signed)
[place] MEDICAID FL2 LEVEL OF CARE SCREENING TOOL     IDENTIFICATION  Patient Name: Kristen Berry Birthdate: [birth year] Sex: female Admission Date (Current Location): 12/21/2018  [place] and [place] Number:  Oval Linsey [id number] [place] and Address:  [place]. [facility], [place] [address], [place],  [address]      Provider Number: [id number]  Attending Physician Name and Address:  Martyn Malay, MD  Relative Name and Phone Number:  Saffran,Chris Son -  [phone] (h), [phone] (mobile)     Current Level of Care: Hospital Recommended Level of Care: [place] Prior Approval Number:    Date Approved/Denied:   PASRR Number: [id number](Eff. 12/26/18 - MUST BL#[id number])  Discharge Plan: SNF    Current Diagnoses: Patient Active Problem List   Diagnosis Date Noted  . Gangrene of toe of right foot ([place])   . Osteomyelitis of toe of right foot ([place]) 12/21/2018  . PAD (peripheral artery disease) ([place]) 01/24/2018  . Peripheral artery disease ([place]) 01/24/2018  . C. difficile diarrhea 01/06/2018  . Hypokalemia 01/06/2018  . ESRD (end stage renal disease) ([place]) 01/06/2018  . Prolonged QT interval 01/06/2018  . Hypotension 01/06/2018  . Multiple rib fractures 01/06/2018  . Pressure injury of skin 11/08/2017  . Coronary artery disease due to lipid rich plaque   . Chest pain   . Acute on chronic systolic CHF (congestive heart failure), NYHA class 3 ([place]) 10/18/2017  . PICC (peripherally inserted central catheter) in place   . Acute osteomyelitis of toe of left foot ([place])   . ARF (acute renal failure) ([place]) 10/10/2017  . AKI (acute kidney injury) ([place]) 10/10/2017  . Essential hypertension 12/14/2016  . Abnormal bone xray 06/15/2016  . MGUS (monoclonal gammopathy of unknown significance) 05/18/2016  . Stage 4 chronic kidney disease ([place]) 05/18/2016  . CHF (congestive heart failure) ([place]) 05/18/2016  . Renal insufficiency 06/28/2013  .  Other and unspecified hyperlipidemia 06/28/2013  . Obesity, unspecified 06/28/2013  . Pain in limb 06/28/2013  . Type 2 diabetes mellitus with neurologic complication, without long-term current use of insulin ([place]) 06/28/2013    Orientation RESPIRATION BLADDER Height & Weight     Self, Time, Situation, Place  Normal Continent Weight: 179 lb 7.3 oz (81.4 kg) Height:  5\' 8"  (172.7 cm)  BEHAVIORAL SYMPTOMS/MOOD NEUROLOGICAL BOWEL NUTRITION STATUS      Continent Diet(Renal with 1200 mL fluid restriction)  AMBULATORY STATUS COMMUNICATION OF NEEDS Skin   Extensive Assist(Patient was unable to ambulate with physical therapist during session on 12/25/18) Verbally Other (Comment), Surgical wounds, Wound Vac(12/22/18- 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 PT at [facility] Eval and Treat - 5 times per week OT Frequency: Evaluated 3/29 at hospital. OT [facility] SNF Eval and Treat - 5 times per week             Contractures Contractures Info: Not present    Additional Factors Info  Code Status, Allergies,  Insulin Sliding Scale Code Status Info: DNR Allergies Info: Crestor   Insulin Sliding Scale Info: 0-9 Units 3 times per day with meals       Current Medications (12/26/2018):  This is the current hospital active medication list Current Facility-Administered Medications  Medication Dose Route Frequency Provider Last Rate Last Dose  . 0.9 %  sodium chloride infusion  250 mL Intravenous PRN  Newt Minion, MD      . 0.9 %  sodium chloride infusion   Intravenous Continuous Newt Minion, MD      . acetaminophen (TYLENOL) tablet 650 mg  650 mg Oral Q6H Lockamy, Timothy, DO   650 mg at 12/26/18 1138   Or  . acetaminophen (TYLENOL) suppository 650 mg  650 mg Rectal Q6H Lockamy, Timothy, DO      . amLODipine (NORVASC) tablet 5 mg  5 mg Oral QPM Newt Minion, MD   5 mg at 12/25/18 1650  . atorvastatin (LIPITOR) tablet 20 mg  20 mg Oral q1800 Newt Minion, MD   20 mg at 12/25/18 1650  . calcitRIOL (ROCALTROL) capsule 0.75 mcg  0.75 mcg Oral Q T,Th,Sa-HD Newt Minion, MD   0.75 mcg at 12/26/18 1049  . calcium acetate (PHOSLO) capsule 667 mg  667 mg Oral BID WC Newt Minion, MD   667 mg at 12/25/18 1649  . carvedilol (COREG) tablet 25 mg  25 mg Oral BID Newt Minion, MD   25 mg at 12/26/18 1138  . Chlorhexidine Gluconate Cloth 2 % PADS 6 each  6 each Topical Q0600 Newt Minion, MD   6 each at 12/25/18 [phone]  . Chlorhexidine Gluconate Cloth 2 % PADS 6 each  6 each Topical Q0600 Alric Seton, PA-C   6 each at 12/26/18 [phone]  . gabapentin (NEURONTIN) capsule 100 mg  100 mg Oral QHS Rory Percy, DO   100 mg at 12/25/18 2126  . heparin 1000 UNIT/ML injection           . heparin ADULT infusion 100 units/mL (25000 units/242mL sodium chloride 0.45%)  1,300 Units/hr Intravenous Continuous Martyn Malay, MD 13 mL/hr at 12/26/18 1138 1,300 Units/hr at 12/26/18 1138  . hydrALAZINE (APRESOLINE) tablet 25 mg  25 mg Oral BID Newt Minion, MD   25 mg at 12/25/18 1650  . insulin aspart (novoLOG) injection 0-9 Units  0-9 Units Subcutaneous TID WC Newt Minion, MD   2 Units at 12/26/18 1145  . multivitamin (RENA-VIT) tablet 1 tablet  1 tablet Oral QHS Newt Minion, MD   1 tablet at 12/25/18 2125  . ondansetron (ZOFRAN) tablet 4 mg  4 mg Oral Q6H PRN Newt Minion, MD   4 mg at 12/25/18 9735   Or  . ondansetron [facility]) injection 4 mg  4 mg Intravenous Q6H PRN Newt Minion, MD       . oxyCODONE (Oxy IR/ROXICODONE) immediate release tablet 5 mg  5 mg Oral Q6H PRN Rory Percy, DO   5 mg at 12/26/18 1050   Or  . oxyCODONE (Oxy IR/ROXICODONE) immediate release tablet 2.5 mg  2.5 mg Oral Q6H PRN Rory Percy, DO      . pentoxifylline (TRENTAL) CR tablet 400 mg  400 mg Oral Q supper Rory Percy, DO   400 mg at 12/25/18 1649  . polyethylene glycol (MIRALAX / GLYCOLAX) packet 17 g  17 g Oral Daily Rumball, Alison, DO   17 g at 12/26/18 1145  .  senna-docusate (Senokot-S) tablet 1 tablet  1 tablet Oral QHS Rumball, Alison, DO      . sodium chloride flush (NS) 0.9 % injection 3 mL  3 mL Intravenous Q12H Newt Minion, MD   3 mL at 12/26/18 1139  . sodium chloride flush (NS) 0.9 % injection 3 mL  3 mL Intravenous PRN Newt Minion, MD      . ticagrelor [facility]) tablet 90 mg  90 mg Oral BID Lockamy, Timothy, DO   90 mg at 12/26/18 1138  . vancomycin (VANCOCIN) 1,000 mg in sodium chloride 0.9 % 250 mL IVPB  1,000 mg Intravenous Q T,Th,Sa-HD Newt Minion, MD   Stopped at 12/26/18 1100     Discharge Medications: Please see discharge summary for a list of discharge medications.  Relevant Imaging Results:  Relevant Lab Results:   Additional Information ss#[id number]. Dialysis patient TTS [place] dialysis center.  Sable Feil, LCSW

## 2018-12-26 NOTE — Progress Notes (Signed)
Pt is crying stating that her pain is 10/10. Last pain med was given around 5pm (q6) Family Medicine is paged. Per Dr Pilar Plate to give an additional 5mg  of Oxycodone. Pt's R foot is elevated; ice packs are used. Per MD "to keep Heparin running" even though pt is going for fistula gram tmrw.

## 2018-12-26 NOTE — Progress Notes (Signed)
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 PMH includes ESRD on HD, provoked bilateral DVT after surgery, type 2 diabetes, MGUS, and  HFpEF    OT comments  Focus of today's session on functional transfers. Pt practiced lateral scoot transfer between bed and w/c with +2 mod assist. She has difficulty maintaining NWB status on right LE and does require assist to prevent weightbearing. She required more assist today but likely due to fatigue and pain (in HD earlier today). Updating discharge plan to recommend SNF.  Pt reports home barriers/concerns (limited space) and also concern regarding level of assist that she needs to perform functional transfers. Will continue to follow acutely.  Follow Up Recommendations  SNF    Equipment Recommendations  Other (comment)(defer to next venue)    Recommendations for Other Services      Precautions / Restrictions Precautions Precautions: Fall Restrictions Weight Bearing Restrictions: Yes RLE Weight Bearing: Non weight bearing       Mobility Bed Mobility Overal bed mobility: Modified Independent                Transfers Overall transfer level: Needs assistance Equipment used: (w/c) Transfers: Lateral/Scoot Transfers          Lateral/Scoot Transfers: +2 physical assistance;Mod assist;From elevated surface General transfer comment: +2 mod assist to lateral scoot from bed to w/c and then return to bed.  +2 mod assist for lateral scoots, one person assisting with scoot and one person assisting with supporting right LE.     Balance                                           ADL either performed or assessed with clinical judgement   ADL Overall ADL's : Needs assistance/impaired                         Toilet Transfer: +2 for physical  assistance;Moderate assistance(lateral scoot) Toilet Transfer Details (indicate cue type and reason): Pt completed lateral scoot transfer bed<>w/c.           General ADL Comments: Discussed discharge plan with patient. Pt agrees that SNF if best d/c plan at this time due to environmental barriers at home (not enough room/space for w/c) and due to increased assist needed for functional mobility      Vision       Perception     Praxis      Cognition Arousal/Alertness: Awake/alert Behavior During Therapy: WFL for tasks assessed/performed Overall Cognitive Status: Within Functional Limits for tasks assessed                                          Exercises     Shoulder Instructions       General Comments      Pertinent Vitals/ Pain       Pain Assessment: Faces Faces Pain Scale: Hurts even more Pain Location: R foot Pain Descriptors / Indicators: Aching Pain Intervention(s): Limited activity within patient's tolerance;Monitored during session;Repositioned  Home Living  Prior Functioning/Environment              Frequency  Min 2X/week        Progress Toward Goals  OT Goals(current goals can now be found in the care plan section)  Progress towards OT goals: Progressing toward goals  Acute Rehab OT Goals Patient Stated Goal: to get home, and she hopes to not need wc for very long OT Goal Formulation: With patient Time For Goal Achievement: 01/07/19 Potential to Achieve Goals: Good ADL Goals Pt Will Perform Lower Body Dressing: with modified independence;with adaptive equipment Pt/caregiver will Perform Home Exercise Program: Both right and left upper extremity;With written HEP provided;Independently Additional ADL Goal #1: Pt will transfer with modified independence on various surfaces in preparation for DC home.  Plan Discharge plan needs to be updated    Co-evaluation     PT/OT/SLP Co-Evaluation/Treatment: Yes Reason for Co-Treatment: For patient/therapist safety;To address functional/ADL transfers   OT goals addressed during session: ADL's and self-care;Proper use of Adaptive equipment and DME      AM-PAC OT "6 Clicks" Daily Activity     Outcome Measure   Help from another person eating meals?: None Help from another person taking care of personal grooming?: None Help from another person toileting, which includes using toliet, bedpan, or urinal?: A Lot Help from another person bathing (including washing, rinsing, drying)?: A Lot Help from another person to put on and taking off regular upper body clothing?: A Little Help from another person to put on and taking off regular lower body clothing?: A Lot 6 Click Score: 17    End of Session Equipment Utilized During Treatment: (w/c)  OT Visit Diagnosis: Unsteadiness on feet (R26.81);Muscle weakness (generalized) (M62.81);Other abnormalities of gait and mobility (R26.89);Pain Pain - Right/Left: Right Pain - part of body: Ankle and joints of foot   Activity Tolerance Patient tolerated treatment well   Patient Left in bed;with call bell/phone within reach   Nurse Communication Mobility status        Time: 3546-5681 OT Time Calculation (min): 37 min  Charges: OT General Charges $OT Visit: 1 Visit OT Treatments $Therapeutic Activity: 8-22 mins    Darrol Jump OTR/L [place] [phone] 12/26/2018, 3:26 PM

## 2018-12-26 NOTE — TOC Benefit Eligibility Note (Signed)
Transition of Care [facility]) Benefit Eligibility Note    Patient Details  Name: Kristen Berry MRN: [id number] Date of Birth: 01-22-1956   Medication/Dose: Arne [place]   Covered?: Yes  Tier: 3 Drug  Prescription Coverage Preferred Pharmacy: YES([facility]  90 DAY SUPPLY FOR RETAIL OR M/O $ 3.90)  Spoke with Person/Company/Phone Number:: KASEY([employer] # [phone])  Co-Pay: $ 3.90  Prior Approval: No     Additional Notes: [place] CARLOINA(EFF-DATE :  09-27-2017 CO-PAY- $ 3.90 FOR EACH PRESCRIPITION)    Memory Argue Phone Number: 12/26/2018, 1:24 PM

## 2018-12-26 NOTE — Progress Notes (Addendum)
[facility] Progress Note   Dialysis Orders: [place] 4 hr  TTS 3 K 2.25 Ca EDW 81.5 right IJ TDC heparin 3000 with 3000 mid treatment dose no ESA or Fe calctiriol 0.75 Recent labs: hgb 11.4  ipTH 313  Assessment/Plan: 1.  R great toe osteomyelitis s/p 1st ray amp 3/27 with VAC -  Dr. Sharol Given -. On Vanc x 2 weeks revascularization- VVS to do arteriorgram Wed 2.  ESRD -  TTS -K 3.7 -use 4 K bath today for the remainder of treatement 3.  Hypertension/volume  - titrate volume with HD - prone to large IDWG net UF Sat 2 L Na low try for 3 L Tuesday goal today 3.5  4.  Anemia  - hgb downward trend 10.6  - no ESA yet  5.  Metabolic bone disease -  continue VDRA/phoslo 6.  Nutrition - renal carb mod diet/vits 7.  Lack of permanent dialysis access - seen 10/31/18 - needs f'gram before proceding - was planned for 2/11 but it was cancelled after waiting all day due to a vascular emergency -planning for f'gram Wed 8. Hx of bed bugs  9. Hx CVA/CAD 10. Hx DVT -on hold due to arteriography  Myriam Jacobson, PA-C [place] [phone] 12/26/2018,9:20 AM  LOS: 5 days   Pt seen, examined and agree w A/P as above.  [facility] 12/26/2018, 5:26 PM    Subjective:   No c/o. Doing well.  Objective Vitals:   12/26/18 0730 12/26/18 0800 12/26/18 0830 12/26/18 0900  BP: 124/61 139/63 134/63 (!) 124/45  Pulse: 77 79 79 85  Resp:      Temp:      TempSrc:      SpO2:      Weight:      Height:       Physical Exam General: NAD  Heart: RRR  Lungs: no rales Abdomen: + BS soft NT  Extremities:right foot ray amp with wound VAC  Dialysis Access: TDC right IJ and poorly maturing AVF    Additional Objective Labs: Basic Metabolic Panel: Recent Labs  Lab 12/23/18 0743 12/24/18 0500 12/25/18 0548 12/26/18 0727  NA 132* 131* 130* 131*  K 3.9 3.8 4.2 3.7  CL 98 93* 93* 94*  CO2 23 22 22 23   GLUCOSE 152* 129* 97 132*  BUN 27* 20 35* 46*   CREATININE 3.81* 3.19* 4.31* 5.20*  CALCIUM 9.0 9.1 8.9 9.1  PHOS 4.6 4.0  --  4.9*   Liver Function Tests: Recent Labs  Lab 12/21/18 1732 12/23/18 0743 12/24/18 0500 12/26/18 0727  AST 16  --   --   --   ALT 10  --   --   --   ALKPHOS 108  --   --   --   BILITOT 0.5  --   --   --   PROT 8.1  --   --   --   ALBUMIN 3.4* 3.0* 3.1* 2.9*   No results for input(s): LIPASE, AMYLASE in the last 168 hours. CBC: Recent Labs  Lab 12/21/18 1732 12/22/18 0313 12/23/18 0743 12/24/18 0500 12/25/18 0548 12/26/18 0727  WBC 12.0* 11.5* 13.8* 13.8* 11.4* 10.9*  NEUTROABS 8.6*  --   --   --   --   --   HGB 12.4 11.8* 11.9* 11.6* 11.1* 10.6*  HCT 38.6 37.5 37.3 36.3 34.8* 33.3*  MCV 101.6* 102.2* 102.8* 102.0* 102.1* 101.2*  PLT 602* 563* 582* 538* 488*  531*   Blood Culture    Component Value Date/Time   SDES BLOOD LEFT HAND 10/19/2017 1500   SPECREQUEST  10/19/2017 1500    BOTTLES DRAWN AEROBIC AND ANAEROBIC Blood Culture adequate volume   CULT NO GROWTH 5 DAYS 10/19/2017 1500   REPTSTATUS 10/24/2017 FINAL 10/19/2017 1500    Cardiac Enzymes: No results for input(s): CKTOTAL, CKMB, CKMBINDEX, TROPONINI in the last 168 hours. CBG: Recent Labs  Lab 12/24/18 2103 12/25/18 0719 12/25/18 1135 12/25/18 1621 12/25/18 2138  GLUCAP 139* 81 131* 128* 153*   Iron Studies: No results for input(s): IRON, TIBC, TRANSFERRIN, FERRITIN in the last 72 hours. Lab Results  Component Value Date   INR 1.7 (H) 12/22/2018   INR 1.6 (H) 12/21/2018   INR 1.42 11/07/2018   Studies/Results: No results found. Medications: . sodium chloride    . sodium chloride    . sodium chloride    . sodium chloride    . sodium chloride    . sodium chloride    . heparin 1,450 Units/hr (12/26/18 0848)  . vancomycin     . acetaminophen  650 mg Oral Q6H   Or  . acetaminophen  650 mg Rectal Q6H  . amLODipine  5 mg Oral QPM  . atorvastatin  20 mg Oral q1800  . calcitRIOL  0.75 mcg Oral Q T,Th,Sa-HD   . calcium acetate  667 mg Oral BID WC  . carvedilol  25 mg Oral BID  . Chlorhexidine Gluconate Cloth  6 each Topical Q0600  . Chlorhexidine Gluconate Cloth  6 each Topical Q0600  . docusate sodium  100 mg Oral BID  . gabapentin  100 mg Oral QHS  . heparin      . heparin  3,000 Units Dialysis Once in dialysis  . hydrALAZINE  25 mg Oral BID  . insulin aspart  0-9 Units Subcutaneous TID WC  . multivitamin  1 tablet Oral QHS  . pentoxifylline  400 mg Oral Q supper  . sodium chloride flush  3 mL Intravenous Q12H  . ticagrelor  90 mg Oral BID

## 2018-12-26 NOTE — TOC Initial Note (Signed)
Transition of Care [facility]) - Initial/Assessment Note    Patient Details  Name: Kristen Berry MRN: [id number] Date of Birth: [birth year]  Transition of Care [facility]) CM/SW Contact:    Sable Feil, LCSW Phone Number: 12/26/2018, 3:51 PM  Clinical Narrative:  CSW talked with patient at the bedside regarding her discharege disposition and recommendation of ST rehab. Kristen Berry was sitting up in bed and a wound vac was on her foot. She was alert, oriented, pleasant and agreeable to talking with CSW regarding her discharge plan. Kristen Berry indicated that she wanted to go home, but with her grandchildren out of school, it will be hard to have therapy in her small trailer. When asked, patient responded that she has never been to a skilled facility for ST rehab.  Patient confirmed that she goes to [facility] TTS and her chair time is 11:20 - 3:20 pm. She also informed CSW that she uses [facility] for her dialysis transportation.   When asked about which family member to contact, patient reported that her son is going through a mid-life crisis right now. Her Kristen Kristen Briere does not have minutes on her phone right now, but provided CSW with the phone numbers of her grandchildren: Kristen Berry - [phone] and Kristen Berry - [phone].                 Expected Discharge Plan: Skilled Nursing Facility Barriers to Discharge: Continued Medical Work up   Patient Goals and CMS Choice Patient states their goals for this hospitalization and ongoing recovery are:: See comment(Patient initially wanted to go home, however with her grandchildren out of school, Ms. Asselin feels the best place to have rehab is in a skilled nursing facility) CMS Medicare.gov Compare Post Acute Care list provided to:: Patient Choice offered to / list presented to : Patient(Medicare list provided to patient)  Expected Discharge Plan and Services Expected Discharge Plan: [place] In-house Referral: Clinical  Social Work Discharge Planning Services: Other - See comment(Nurse case manager initally talked with patient and patient declined [place] services. PT worked with patient again today and SNF recommended) Post Acute Care Choice: [place] arrangements for the past 2 months: Mobile Home                 DME Arranged: N/A DME Agency: NA HH Arranged: Refused SNF(Patient initially refused HH) [place] Agency: NA  Prior Living Arrangements/Services Living arrangements for the past 2 months: Mobile Home Lives with:: Adult Children, Other (Comment)(Per patient, her Kristen and 2 grandchildren live with her). Kristen Berry and grandchildren Kristen Berry and Kristen Berry. Patient language and need for interpreter reviewed:: No Do you feel safe going back to the place where you live?: No(Patient now in agreement with SNF, primarily because her gransdchildren are out of school and at home, and her trailer is small.)      Need for Family Participation in Patient Care: No (Comment) Care giver support system in place?: Yes (comment)(Kristen and grandchildrenl live with patient) Current home services: Other (comment)(None) Criminal Activity/Legal Involvement Pertinent to Current Situation/Hospitalization: No - Comment as needed  Activities of Daily Living Home Assistive Devices/Equipment: Eyeglasses, Gilford Rile (specify type), Bedside commode/3-in-1 ADL Screening (condition at time of admission) Patient's cognitive ability adequate to safely complete daily activities?: No Is the patient deaf or have difficulty hearing?: No Does the patient have difficulty seeing, even when wearing glasses/contacts?: No Does the patient have difficulty concentrating, remembering, or making decisions?: No Patient able to express  need for assistance with ADLs?: Yes Does the patient have difficulty dressing or bathing?: No Independently performs ADLs?: Yes (appropriate for developmental age) Does the patient have  difficulty walking or climbing stairs?: Yes Weakness of Legs: Both Weakness of Arms/Hands: None  Permission Sought/Granted Permission sought to share information with : Family Supports Permission granted to share information with : Yes, Verbal Permission Granted  Share Information with NAME: Kristen Brumm     Permission granted to share info w Relationship: Kristen  Permission granted to share info w Contact Information: Kristen Kristen Berry(Grandson Kristen Berry's phone  number - [phone] or Granddaughter Kristen Berry - [phone]. Pder patient, her Kristen has no minutes on her phone)  Emotional Assessment Appearance:: Appears older than stated age Attitude/Demeanor/Rapport: Engaged Affect (typically observed): Appropriate, Pleasant Orientation: : Oriented to Self, Oriented to Place, Oriented to  Time, Oriented to Situation Alcohol / Substance Use: Tobacco Use, Alcohol Use, Illicit Drugs(Per H&P, patient has never smoked and does not drink or use illicit drugs) Psych Involvement: No (comment)  Admission diagnosis:  Other acute osteomyelitis of right foot [facility]) [M86.171] Patient Active Problem List   Diagnosis Date Noted  . Gangrene of toe of right foot ([place])   . Osteomyelitis of toe of right foot ([place]) 12/21/2018  . PAD (peripheral artery disease) ([place]) 01/24/2018  . Peripheral artery disease ([place]) 01/24/2018  . C. difficile diarrhea 01/06/2018  . Hypokalemia 01/06/2018  . ESRD (end stage renal disease) ([place]) 01/06/2018  . Prolonged QT interval 01/06/2018  . Hypotension 01/06/2018  . Multiple rib fractures 01/06/2018  . Pressure injury of skin 11/08/2017  . Coronary artery disease due to lipid rich plaque   . Chest pain   . Acute on chronic systolic CHF (congestive heart failure), NYHA class 3 ([place]) 10/18/2017  . PICC (peripherally inserted central catheter) in place   . Acute osteomyelitis of toe of left foot ([place])   . ARF (acute renal failure) ([place]) 10/10/2017  . AKI (acute kidney  injury) ([place]) 10/10/2017  . Essential hypertension 12/14/2016  . Abnormal bone xray 06/15/2016  . MGUS (monoclonal gammopathy of unknown significance) 05/18/2016  . Stage 4 chronic kidney disease ([place]) 05/18/2016  . CHF (congestive heart failure) ([place]) 05/18/2016  . Renal insufficiency 06/28/2013  . Other and unspecified hyperlipidemia 06/28/2013  . Obesity, unspecified 06/28/2013  . Pain in limb 06/28/2013  . Type 2 diabetes mellitus with neurologic complication, without long-term current use of insulin ([place]) 06/28/2013   PCP:  Martinique, Sarah T, MD Pharmacy:   [place], [address] [place] [address] Phone: [phone] Fax: [phone]  Social Determinants of Health (SDOH) Interventions  None needed at this time  Readmission Risk Interventions No flowsheet data found.

## 2018-12-26 NOTE — Progress Notes (Signed)
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 PMH includes ESRD on HD, provoked bilateral DVT after surgery, type 2 diabetes, MGUS, and  HFpEF     PT Comments    Continuing work on functional mobility and activity tolerance;   Focus of today's session on functional transfers. Pt practiced lateral scoot transfer between bed and w/c with +2 mod assist. She has difficulty maintaining NWB status on right LE and does require assist to prevent weightbearing. She required more assist today but likely due to fatigue and pain (in HD earlier today). Updating discharge plan to recommend SNF.  Pt reports home barriers/concerns (limited space) and also concern regarding level of assist that she needs to perform functional transfers. Notified MD, SW, and Case Mgr; Will continue to follow acutely.  Follow Up Recommendations  SNF     Equipment Recommendations  Rolling walker with 5" wheels;Wheelchair (measurements PT);Wheelchair cushion (measurements PT)    Recommendations for Other Services       Precautions / Restrictions Precautions Precautions: Fall Restrictions Weight Bearing Restrictions: Yes RLE Weight Bearing: Non weight bearing    Mobility  Bed Mobility Overal bed mobility: Modified Independent             General bed mobility comments: No difficulty getting LEs into bed  Transfers Overall transfer level: Needs assistance Equipment used: (w/c) Transfers: Lateral/Scoot Transfers          Lateral/Scoot Transfers: +2 physical assistance;Mod assist;From elevated surface General transfer comment: +2 mod assist to lateral scoot from bed to w/c and then return to bed.  +2 mod assist for lateral scoots, one person assisting with scoot and one person assisting with supporting right LE.   Ambulation/Gait                  Stairs             Wheelchair Mobility    Modified Rankin (Stroke Patients Only)       Balance     Sitting balance-Leahy Scale: Good     Standing balance support: Bilateral upper extremity supported;During functional activity Standing balance-Leahy Scale: Poor Standing balance comment: Dependent on UE support                            Cognition Arousal/Alertness: Awake/alert Behavior During Therapy: WFL for tasks assessed/performed Overall Cognitive Status: Within Functional Limits for tasks assessed                                        Exercises      General Comments        Pertinent Vitals/Pain Pain Assessment: Faces Faces Pain Scale: Hurts even more Pain Location: R foot Pain Descriptors / Indicators: Aching Pain Intervention(s): Limited activity within patient's tolerance;Patient requesting pain meds-RN notified    Home Living                      Prior Function            PT Goals (current goals can now be found in the care plan section) Acute Rehab PT Goals Patient Stated Goal: to get home, and she hopes to not need wc for very  long PT Goal Formulation: With patient Time For Goal Achievement: 01/07/19 Potential to Achieve Goals: Good Progress towards PT goals: Progressing toward goals    Frequency    Min 2X/week      PT Plan Discharge plan needs to be updated;Frequency needs to be updated    Co-evaluation PT/OT/SLP Co-Evaluation/Treatment: Yes Reason for Co-Treatment: For patient/therapist safety;To address functional/ADL transfers PT goals addressed during session: Mobility/safety with mobility OT goals addressed during session: ADL's and self-care;Proper use of Adaptive equipment and DME      AM-PAC PT "6 Clicks" Mobility   Outcome Measure  Help needed turning from your back to your side while in a flat bed without using bedrails?: None Help needed moving from lying on your back to  sitting on the side of a flat bed without using bedrails?: None Help needed moving to and from a bed to a chair (including a wheelchair)?: A Lot Help needed standing up from a chair using your arms (e.g., wheelchair or bedside chair)?: A Lot Help needed to walk in hospital room?: A Lot Help needed climbing 3-5 steps with a railing? : Total 6 Click Score: 15    End of Session Equipment Utilized During Treatment: (bed pad) Activity Tolerance: Patient limited by fatigue Patient left: in bed;with call bell/phone within reach Nurse Communication: Mobility status PT Visit Diagnosis: Unsteadiness on feet (R26.81);Other abnormalities of gait and mobility (R26.89);Difficulty in walking, not elsewhere classified (R26.2)     Time: 1430-1506 PT Time Calculation (min) (ACUTE ONLY): 36 min  Charges:  $Therapeutic Activity: 8-22 mins                     Roney Marion, PT  Acute Rehabilitation Services Pager [phone] Office [place] 12/26/2018, 4:16 PM

## 2018-12-26 NOTE — Progress Notes (Signed)
Family Medicine Teaching Service Daily Progress Note Intern Pager: [phone]  Patient name: Kristen Berry Medical record number: [id number] Date of birth: 05-14-1956 Age: 63 y.o. Gender: female  Primary Care Provider: Martinique, Sarah T, MD Consultants: Orthopedics, Nephrology Code Status: DNR  Pt Overview and Major Events to Date:  Kristen Berry [facility] a 63 y.o.femalepresenting with osteomyelitis of the right great toe.[name] PMH is significant forosteomyelitis of the left toes s/p amputation, ESRD, diabetes type 2, hypertension, history of DVT, history of stroke, CHF, MGUS  Assessment and Plan:  Osteomyelitisof right first toe, day #3 s/p amputation: Pain remains well controlled this am, oxy 2.5- 5mg  prn q6. Right foot is wrapped with wound VAC in place.   -Ortho consulted; Follow up with Dr. Sharol Given in one week. -Continue heparin drip, Brilinta, and Trental leading up to arteriography/fistulogram on Wednesday 4/1 -Oxycodone 2.5-5mg  q6 prn, tylenol scheduled; Discontinue Dilaudid -Continue Vancomycin TTS with HD for 2 weeks; Stop date 01/03/2019 -Daily weights/ I&O -Vitals per routine -PT/OT recommending HH - patient desires SNF, preferably in or around [place] -f/u ABIs 03/30  Right leg cellulitis, improved:foot wrapped with wound vac in place today. She has remained afebrile and WBC 10.9 slightly decreased from previous 11.4. Recommend continuting Vanc with dialysis. Will treat for MRSA given she has had it in the past.  - Cont Vancomycin with dialysis for 2 weeks. Please place OPAT order on discharge. - Cont to monitor regression of rash.  ESRD on HDTThS, chronic, stable: HD tomorrow 03/31. Creatinine is 1.83>2.6>3.8>3.2>4.3 on 03/30, GFR is 29. Calcium 8.8, albumin 3.4. Other electrolytes normal. -Nephro consulted, HD today to remain on schedule -Renally dose medications  -Continue PhosLo  Diabetes mellitus type 2: A1c 5.5% 12/22/2018.Most recent CBGs 128-153 requiring only 2U of  aspart in past 24 hours. Diet controlled. -sSSI, CBGs q4 hours  HFrEF:January 2019 patient had echo that showed LVEF 35 to 40%. Septal and apical akinesis, moderately dilated LV, mild LVH. -Continue HD. No other tx at this time.  CAD: left heart cath in February 2019 showed severe diffuse diabetic coronary disease, 70 to 80% stenosis of proximal LAD 75% obstruction of RCA -Continue heparin drip, Brilinta, and Trental  Mild asthma: since childhood. Hasn't usedher albuterolin months. No other medications. -Hold albuterol  HTN, chronic, stable: BP 118/46. Takes amlodipine 5mg , Coreg 25mg  BID, hydralazine 25mg  BID at home. -Continue home meds  HLD: pt takes 20mg  lipitor at home. Last Lipid panel January 2019 with Trig 180, HDL 39, and LDL 109. -Continue Lipitor  MGUS: diagnosed 2017. Patient not currently on any medication. [facility]  History of left leg DVT:pt takes warfarin 4mg  TTSS/5mg  MWF.Patient has history of DVT in left leg. Unknown when she first started warfarin, has been at least 1 year. Patient currently subtherapeutic with INR of 1.6.  -Continue Brilinta, Trental, heparin gtt; hold Warfarin for bridging s/p Arteriography/Fistulogram Wednesday 04/01   Hx of stroke: patient states she had a stroke April 2019. Was treated at [facility] in [place]. Does not appear to have any residual deficits. Patient takes Brilinta 90 mg twice daily.  -Continue Brilinta, Trental, heparin gtt; hold Warfarin s/p Arteriography/Fistulogram Wednesday 04/01   FEN/GI: Renal w/ fluid restricted Prophylaxis:SubQ heparin, Brilinta, Trental  Disposition: Discharge once stable s/p Arteriography/Fistulogram on 04/01  FEN/GI: Renal diet w/fluid restriction PPx: Heparin gtt  Disposition: discharge  Subjective:  Patient is in HD this morning. She is feeling well and currently not in any pain. She did  have some discomfort in her  right foot early this am that responded to pain medication.  Objective: Temp:  [97.5 F (36.4 C)-98.1 F (36.7 C)] 97.6 F (36.4 C) (03/31 1056) Pulse Rate:  [77-87] 87 (03/31 1056) Resp:  [18] 18 (03/31 1056) BP: (112-148)/(41-67) 147/67 (03/31 1056) SpO2:  [98 %-99 %] 98 % (03/31 1056) Weight:  [79.8 kg-84.3 kg] 81.4 kg (03/31 1056) Physical Exam: Gen: Alert and Oriented x 3, NAD HEENT: Normocephalic, atraumatic CV: RRR, no murmurs, normal S1, S2 split Resp: CTAB, no wheezing, rales, or rhonchi, comfortable work of breathing Abd: non-distended, non-tender, soft, +bs in all four quadrants MSK: Right foot wrapped in ace bandage with wound vac in place Ext: no clubbing, cyanosis, or edema Skin: warm, dry, intact, no rashes  Laboratory: Recent Labs  Lab 12/24/18 0500 12/25/18 0548 12/26/18 0727  WBC 13.8* 11.4* 10.9*  HGB 11.6* 11.1* 10.6*  HCT 36.3 34.8* 33.3*  PLT 538* 488* 531*   Recent Labs  Lab 12/21/18 1732  12/24/18 0500 12/25/18 0548 12/26/18 0727  NA 135   < > 131* 130* 131*  K 3.7   < > 3.8 4.2 3.7  CL 98   < > 93* 93* 94*  CO2 25   < > 22 22 23   BUN 7*   < > 20 35* 46*  CREATININE 1.83*   < > 3.19* 4.31* 5.20*  CALCIUM 8.8*   < > 9.1 8.9 9.1  PROT 8.1  --   --   --   --   BILITOT 0.5  --   --   --   --   ALKPHOS 108  --   --   --   --   ALT 10  --   --   --   --   AST 16  --   --   --   --   GLUCOSE 90   < > 129* 97 132*   < > = values in this interval not displayed.   Hep B surface antigen: negative  Imaging/Diagnostic Tests: 3/26 - DG Right Foot: IMPRESSION: 1. Osteomyelitis of the distal phalanx of the right great toe involving the distal tuft and lateral aspect of the proximal to mid phalanx. 2. There is surrounding soft tissue swelling, without soft tissue air. 3. No fracture, dislocation or other evidence of osteomyelitis.  Nuala Alpha, DO 12/26/2018, 2:04 PM PGY-2, [place] Intern pager: [phone], text pages  welcome

## 2018-12-26 NOTE — Progress Notes (Signed)
[place] for Heparin Indication: h/o DVT and CVA   Allergies  Allergen Reactions  . Crestor [Rosuvastatin Calcium] Other (See Comments)    Leg pain    Patient Measurements: Height: 5\' 8"  (172.7 cm) Weight: 179 lb 7.3 oz (81.4 kg) IBW/kg (Calculated) : 63.9 Heparin Dosing Weight: 80kg  Vital Signs: Temp: 97.9 F (36.6 C) (03/31 1643) Temp Source: Oral (03/31 1643) BP: 127/47 (03/31 1643) Pulse Rate: 82 (03/31 1643)  Labs: Recent Labs    12/24/18 0500 12/25/18 0548 12/25/18 1706 12/26/18 0500 12/26/18 0727 12/26/18 1952  HGB 11.6* 11.1*  --   --  10.6*  --   HCT 36.3 34.8*  --   --  33.3*  --   PLT 538* 488*  --   --  531*  --   HEPARINUNFRC 0.42 0.26* <0.10* 1.06*  --  0.14*  CREATININE 3.19* 4.31*  --   --  5.20*  --     Estimated Creatinine Clearance: 12.4 mL/min (A) (by C-G formula based on SCr of 5.2 mg/dL (H)).   Medical History: Past Medical History:  Diagnosis Date  . Anemia   . Arthritis    "hands, knees" (10/10/2017)  . Asthma   . CHF (congestive heart failure) ([place])   . Chronic kidney disease (CKD), stage IV (severe) ([place])    Dialysis T/ Th/ Sat  . Coronary artery disease   . H/O Clostridium difficile infection   . High cholesterol   . History of kidney stones   . Hypertension   . PVD (peripheral vascular disease) ([place])    "LLE; will have OR" (10/10/2017)  . Sleep apnea    "never given mask" (10/10/2017)  . Stroke [facility])    mild stroke mid April - 2019  . Type II diabetes mellitus ([place])    "no RX anymore" (10/10/2017)    Assessment:  89 yoF with h/o DVT and CVA s/p R great toe amputation, on Coumadin PTA but currently on hold in anticipation of angiography for next week. Pharmacy consulted to dose IV heparin.   3/31 AM: Heparin infusion restarted last night after it was stopped during the day. AM heparin level drawn before HD today resulted supratherapeutic at 1.06. Spoke with HD RN, no infusion issues  and labs not drawn where heparin is infusing. Will reduce rate at this time and recheck heparin level. Previously therapeutic at 1350 units/hr. Hgb stable 10.6 and pltc 531.  The heparin level came back low this PM. I'm wondering that the elevated level this AM was false. Due to his recent amputation and hx of CVA, I'm going to lower the therapeutic goal to 0.3-0.5 to make the bleeding risk lower.   Goal of Therapy:  Heparin level 0.3-0.5 Monitor platelets by anticoagulation protocol: Yes   Plan:   Increase the heparin back to 1450 units/hr Check level in AM Warfarin on hold Monitor daily heparin level, CBC, s/s of bleed  Onnie Boer, PharmD, [place], AAHIVP, CPP Infectious Disease Pharmacist 12/26/2018 8:34 PM

## 2018-12-26 NOTE — Progress Notes (Signed)
PT Cancellation Note  Patient Details Name: CONTINA STRAIN MRN: [id number] DOB: Aug 15, 1956   Cancelled Treatment:     Pt unavailable, at a procedure;   Currently in HD;  Roney Marion, [place]  Acute Rehabilitation Services Pager [phone] Office [phone]    Colletta [place] 12/26/2018, 8:31 AM

## 2018-12-26 NOTE — Progress Notes (Signed)
Attempted ABI 12/25/18, was unable to complete exam due to patients wound vac and dressings. Will place order on hold, please advise.   Abram Sander 12/26/2018 8:54 AM

## 2018-12-26 NOTE — Progress Notes (Addendum)
[place] for Heparin Indication: h/o DVT and CVA   Allergies  Allergen Reactions  . Crestor [Rosuvastatin Calcium] Other (See Comments)    Leg pain    Patient Measurements: Height: 5\' 8"  (172.7 cm) Weight: 185 lb 13.6 oz (84.3 kg) IBW/kg (Calculated) : 63.9 Heparin Dosing Weight: 80kg  Vital Signs: Temp: 97.6 F (36.4 C) (03/31 0700) Temp Source: Oral (03/31 0700) BP: 117/56 (03/31 0930) Pulse Rate: 82 (03/31 0930)  Labs: Recent Labs    12/24/18 0500 12/25/18 0548 12/25/18 1706 12/26/18 0500 12/26/18 0727  HGB 11.6* 11.1*  --   --  10.6*  HCT 36.3 34.8*  --   --  33.3*  PLT 538* 488*  --   --  531*  HEPARINUNFRC 0.42 0.26* <0.10* 1.06*  --   CREATININE 3.19* 4.31*  --   --  5.20*    Estimated Creatinine Clearance: 12.6 mL/min (A) (by C-G formula based on SCr of 5.2 mg/dL (H)).   Medical History: Past Medical History:  Diagnosis Date  . Anemia   . Arthritis    "hands, knees" (10/10/2017)  . Asthma   . CHF (congestive heart failure) ([place])   . Chronic kidney disease (CKD), stage IV (severe) ([place])    Dialysis T/ Th/ Sat  . Coronary artery disease   . H/O Clostridium difficile infection   . High cholesterol   . History of kidney stones   . Hypertension   . PVD (peripheral vascular disease) ([place])    "LLE; will have OR" (10/10/2017)  . Sleep apnea    "never given mask" (10/10/2017)  . Stroke [facility])    mild stroke mid April - 2019  . Type II diabetes mellitus ([place])    "no RX anymore" (10/10/2017)    Assessment:  37 yoF with h/o DVT and CVA s/p R great toe amputation, on Coumadin PTA but currently on hold in anticipation of angiography for next week. Pharmacy consulted to dose IV heparin.   3/31 AM: Heparin infusion restarted last night after it was stopped during the day. AM heparin level drawn before HD today resulted supratherapeutic at 1.06. Spoke with HD RN, no infusion issues and labs not drawn where heparin is  infusing. Will reduce rate at this time and recheck heparin level. Previously therapeutic at 1350 units/hr. Hgb stable 10.6 and pltc 531.  Goal of Therapy:  Heparin level 0.3-0.7 units/ml  Monitor platelets by anticoagulation protocol: Yes   Plan:  Decrease heparin drip to 1300 units/hr Heparin level at 1830 Warfarin on hold Monitor daily heparin level, CBC, s/s of bleed  Janae Bridgeman, PharmD PGY1 Pharmacy Resident Phone: [phone] 12/26/2018 10:13 AM

## 2018-12-26 NOTE — TOC Initial Note (Signed)
Transition of Care [facility]) - Initial/Assessment Note    Patient Details  Name: Kristen Berry MRN: [id number] Date of Birth: 04/04/1956  Transition of Care [facility]) CM/SW Contact:    Bartholomew Crews, RN Phone Number: [phone] 12/26/2018, 12:48 PM  Clinical Narrative:                 Spoke with patient at bedside about recommendations for [facility] PT/OT and DME. Patient declines [place] at this time stating that she lives in a mobile home with her daughter and 2 grandchildren and there is no room to participate in therapy. Patient states that she already has a RW, but does need the recommended WC.   Patient is from [place] and was transported to hospital by ambulance from hemodialysis clinic. Patient states her daughter does not drive, and she is not sure that she will have someone to pick her up when she is medically ready to transition home. Dicussed ambulance transport via [facility] since patient currently has a vac dressing to her foot. Patient agreed to [facility] for the [facility] - depending on how patient transitions home, WC will be delivered either to bedside or to home. Patient will need DME order for wheelchair - light weight wheelchair with seat and back cushion.   Patient usually rides [facility] to hemodialysis on Tuesdays and Thursdays, and uses a private transport company on Saturdays.   Patient gets prescriptions filled at [facility] in [place] - reports no problems with acquiring her medications.   CM to follow for transition of care needs.   Expected Discharge Plan: Home/Self Care Barriers to Discharge: Continued Medical Work up   Patient Goals and CMS Choice   CMS Medicare.gov Compare Post Acute Care list provided to:: Patient Choice offered to / list presented to : Patient  Expected Discharge Plan and Services Expected Discharge Plan: Home/Self Care In-house Referral: NA Discharge Planning Services: CM Consult Post Acute Care Choice: Durable Medical Equipment Living arrangements for the  past 2 months: Mobile Home                 DME Arranged: Wheelchair manual DME Agency: [facility] HH Arranged: OT, PT(patient declined [place] services) [place] Agency: NA(pt declined [place] services)  Prior Living Arrangements/Services Living arrangements for the past 2 months: Mobile Home Lives with:: Self, Adult Children, Minor Children(lives with daughter and grandchildren) Patient language and need for interpreter reviewed:: Yes Do you feel safe going back to the place where you live?: Yes      Need for Family Participation in Patient Care: Yes (Comment) Care giver support system in place?: Yes (comment) Current home services: DME Criminal Activity/Legal Involvement Pertinent to Current Situation/Hospitalization: No - Comment as needed  Activities of Daily Living Home Assistive Devices/Equipment: Eyeglasses, Environmental consultant (specify type), Bedside commode/3-in-1 ADL Screening (condition at time of admission) Patient's cognitive ability adequate to safely complete daily activities?: No Is the patient deaf or have difficulty hearing?: No Does the patient have difficulty seeing, even when wearing glasses/contacts?: No Does the patient have difficulty concentrating, remembering, or making decisions?: No Patient able to express need for assistance with ADLs?: Yes Does the patient have difficulty dressing or bathing?: No Independently performs ADLs?: Yes (appropriate for developmental age) Does the patient have difficulty walking or climbing stairs?: Yes Weakness of Legs: Both Weakness of Arms/Hands: None  Permission Sought/Granted   Permission granted to share information with : No              Emotional Assessment Appearance:: Appears older  than stated age Attitude/Demeanor/Rapport: Engaged Affect (typically observed): Accepting Orientation: : Oriented to Self, Oriented to  Time, Oriented to Place, Oriented to Situation   Psych Involvement: No (comment)  Admission diagnosis:  Other acute  osteomyelitis of right foot [facility]) [M86.171] Patient Active Problem List   Diagnosis Date Noted  . Gangrene of toe of right foot ([place])   . Osteomyelitis of toe of right foot ([place]) 12/21/2018  . PAD (peripheral artery disease) ([place]) 01/24/2018  . Peripheral artery disease ([place]) 01/24/2018  . C. difficile diarrhea 01/06/2018  . Hypokalemia 01/06/2018  . ESRD (end stage renal disease) ([place]) 01/06/2018  . Prolonged QT interval 01/06/2018  . Hypotension 01/06/2018  . Multiple rib fractures 01/06/2018  . Pressure injury of skin 11/08/2017  . Coronary artery disease due to lipid rich plaque   . Chest pain   . Acute on chronic systolic CHF (congestive heart failure), NYHA class 3 ([place]) 10/18/2017  . PICC (peripherally inserted central catheter) in place   . Acute osteomyelitis of toe of left foot ([place])   . ARF (acute renal failure) ([place]) 10/10/2017  . AKI (acute kidney injury) ([place]) 10/10/2017  . Essential hypertension 12/14/2016  . Abnormal bone xray 06/15/2016  . MGUS (monoclonal gammopathy of unknown significance) 05/18/2016  . Stage 4 chronic kidney disease ([place]) 05/18/2016  . CHF (congestive heart failure) ([place]) 05/18/2016  . Renal insufficiency 06/28/2013  . Other and unspecified hyperlipidemia 06/28/2013  . Obesity, unspecified 06/28/2013  . Pain in limb 06/28/2013  . Type 2 diabetes mellitus with neurologic complication, without long-term current use of insulin ([place]) 06/28/2013   PCP:  Martinique, Sarah T, MD Pharmacy:   [place], [address] [place] [address] Phone: [phone] Fax: [phone]     Social Determinants of Health (SDOH) Interventions    Readmission Risk Interventions No flowsheet data found.

## 2018-12-26 NOTE — H&P (View-Only) (Signed)
   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 Sharol Given on Friday.  The patient is scheduled for a right lower extremity arteriogram possible intervention tomorrow.  POORLY MATURING LEFT UPPER ARM FISTULA: Patient had previously been scheduled for a fistulogram but it was canceled because of an emergency.  For this reason we will also try to obtain her fistulogram tomorrow while she is having her arteriogram.  END-STAGE RENAL DISEASE: She underwent hemodialysis today.  Her normal days are Tuesday Thursday Saturday.  I have written her preop orders.   SUBJECTIVE:   No complaints this morning  PHYSICAL EXAM:   Vitals:   12/26/18 0930 12/26/18 1000 12/26/18 1030 12/26/18 1056  BP: (!) 117/56 123/61 (!) 112/41 (!) 147/67  Pulse: 82 85 80 87  Resp:    18  Temp:    97.6 F (36.4 C)  TempSrc:    Oral  SpO2:    98%  Weight:    81.4 kg  Height:       Weak thrill in left upper arm fistula. She still has the dressing from Friday on her right leg.  LABS:   Lab Results  Component Value Date   WBC 10.9 (H) 12/26/2018   HGB 10.6 (L) 12/26/2018   HCT 33.3 (L) 12/26/2018   MCV 101.2 (H) 12/26/2018   PLT 531 (H) 12/26/2018   Lab Results  Component Value Date   CREATININE 5.20 (H) 12/26/2018   Lab Results  Component Value Date   INR 1.7 (H) 12/22/2018   CBG (last 3)  Recent Labs    12/25/18 1621 12/25/18 2138 12/26/18 1136  GLUCAP 128* 153* 151*    PROBLEM LIST:    Active Problems:   Osteomyelitis of toe of right foot (HCC)   Gangrene of toe of right foot (HCC)   CURRENT MEDS:   . acetaminophen  650 mg Oral Q6H   Or  . acetaminophen  650 mg Rectal Q6H  . amLODipine  5 mg Oral QPM  . atorvastatin  20 mg Oral q1800  . calcitRIOL  0.75 mcg Oral Q T,Th,Sa-HD  . calcium acetate  667 mg Oral BID WC  . carvedilol  25 mg  Oral BID  . Chlorhexidine Gluconate Cloth  6 each Topical Q0600  . Chlorhexidine Gluconate Cloth  6 each Topical Q0600  . gabapentin  100 mg Oral QHS  . heparin      . hydrALAZINE  25 mg Oral BID  . insulin aspart  0-9 Units Subcutaneous TID WC  . multivitamin  1 tablet Oral QHS  . pentoxifylline  400 mg Oral Q supper  . polyethylene glycol  17 g Oral Daily  . senna-docusate  1 tablet Oral QHS  . sodium chloride flush  3 mL Intravenous Q12H  . ticagrelor  90 mg Oral BID    Kristen Berry Beeper: [phone] Office: [phone] 12/26/2018

## 2018-12-26 NOTE — Progress Notes (Signed)
   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 Sharol Given on Friday.  The patient is scheduled for a right lower extremity arteriogram possible intervention tomorrow.  POORLY MATURING LEFT UPPER ARM FISTULA: Patient had previously been scheduled for a fistulogram but it was canceled because of an emergency.  For this reason we will also try to obtain her fistulogram tomorrow while she is having her arteriogram.  END-STAGE RENAL DISEASE: She underwent hemodialysis today.  Her normal days are Tuesday Thursday Saturday.  I have written her preop orders.   SUBJECTIVE:   No complaints this morning  PHYSICAL EXAM:   Vitals:   12/26/18 0930 12/26/18 1000 12/26/18 1030 12/26/18 1056  BP: (!) 117/56 123/61 (!) 112/41 (!) 147/67  Pulse: 82 85 80 87  Resp:    18  Temp:    97.6 F (36.4 C)  TempSrc:    Oral  SpO2:    98%  Weight:    81.4 kg  Height:       Weak thrill in left upper arm fistula. She still has the dressing from Friday on her right leg.  LABS:   Lab Results  Component Value Date   WBC 10.9 (H) 12/26/2018   HGB 10.6 (L) 12/26/2018   HCT 33.3 (L) 12/26/2018   MCV 101.2 (H) 12/26/2018   PLT 531 (H) 12/26/2018   Lab Results  Component Value Date   CREATININE 5.20 (H) 12/26/2018   Lab Results  Component Value Date   INR 1.7 (H) 12/22/2018   CBG (last 3)  Recent Labs    12/25/18 1621 12/25/18 2138 12/26/18 1136  GLUCAP 128* 153* 151*    PROBLEM LIST:    Active Problems:   Osteomyelitis of toe of right foot (HCC)   Gangrene of toe of right foot (HCC)   CURRENT MEDS:   . acetaminophen  650 mg Oral Q6H   Or  . acetaminophen  650 mg Rectal Q6H  . amLODipine  5 mg Oral QPM  . atorvastatin  20 mg Oral q1800  . calcitRIOL  0.75 mcg Oral Q T,Th,Sa-HD  . calcium acetate  667 mg Oral BID WC  . carvedilol  25 mg  Oral BID  . Chlorhexidine Gluconate Cloth  6 each Topical Q0600  . Chlorhexidine Gluconate Cloth  6 each Topical Q0600  . gabapentin  100 mg Oral QHS  . heparin      . hydrALAZINE  25 mg Oral BID  . insulin aspart  0-9 Units Subcutaneous TID WC  . multivitamin  1 tablet Oral QHS  . pentoxifylline  400 mg Oral Q supper  . polyethylene glycol  17 g Oral Daily  . senna-docusate  1 tablet Oral QHS  . sodium chloride flush  3 mL Intravenous Q12H  . ticagrelor  90 mg Oral BID    Deitra Mayo Beeper: [phone] Office: [phone] 12/26/2018

## 2018-12-27 ENCOUNTER — Encounter (HOSPITAL_COMMUNITY)
Admission: EM | Disposition: A | Discharge status: Skilled Nursing Facility | Payer: Self-pay | Source: Home / Self Care | Attending: Family Medicine

## 2018-12-27 DIAGNOSIS — I70235 Atherosclerosis of native arteries of right leg with ulceration of other part of foot: Secondary | ICD-10-CM

## 2018-12-27 DIAGNOSIS — E1152 Type 2 diabetes mellitus with diabetic peripheral angiopathy with gangrene: Principal | ICD-10-CM

## 2018-12-27 DIAGNOSIS — Z992 Dependence on renal dialysis: Secondary | ICD-10-CM

## 2018-12-27 DIAGNOSIS — T82898A Other specified complication of vascular prosthetic devices, implants and grafts, initial encounter: Secondary | ICD-10-CM

## 2018-12-27 DIAGNOSIS — Z794 Long term (current) use of insulin: Secondary | ICD-10-CM

## 2018-12-27 HISTORY — PX: A/V FISTULAGRAM: CATH118298

## 2018-12-27 HISTORY — PX: LOWER EXTREMITY ANGIOGRAPHY: CATH118251

## 2018-12-27 HISTORY — PX: PERIPHERAL VASCULAR INTERVENTION: CATH118257

## 2018-12-27 LAB — CBC
HCT: 35.2 % — ABNORMAL LOW (ref 36.0–46.0)
HCT: 37.4 % (ref 36.0–46.0)
Hemoglobin: 11.7 g/dL — ABNORMAL LOW (ref 12.0–15.0)
Hemoglobin: 11.8 g/dL — ABNORMAL LOW (ref 12.0–15.0)
MCH: 33.9 pg (ref 26.0–34.0)
MCH: 32.4 pg (ref 26.0–34.0)
MCHC: 33.2 g/dL (ref 30.0–36.0)
MCHC: 31.6 g/dL (ref 30.0–36.0)
MCV: 102 fL — ABNORMAL HIGH (ref 80.0–100.0)
MCV: 102.7 fL — ABNORMAL HIGH (ref 80.0–100.0)
Platelets: 507 10*3/uL — ABNORMAL HIGH (ref 150–400)
Platelets: 468 10*3/uL — ABNORMAL HIGH (ref 150–400)
RBC: 3.45 MIL/uL — ABNORMAL LOW (ref 3.87–5.11)
RBC: 3.64 MIL/uL — ABNORMAL LOW (ref 3.87–5.11)
RDW: 14.9 % (ref 11.5–15.5)
RDW: 14.7 % (ref 11.5–15.5)
WBC: 13.3 10*3/uL — ABNORMAL HIGH (ref 4.0–10.5)
WBC: 12.4 10*3/uL — ABNORMAL HIGH (ref 4.0–10.5)
nRBC: 0 % (ref 0.0–0.2)
nRBC: 0 % (ref 0.0–0.2)

## 2018-12-27 LAB — RENAL FUNCTION PANEL
Albumin: 3.1 g/dL — ABNORMAL LOW (ref 3.5–5.0)
Albumin: 3.1 g/dL — ABNORMAL LOW (ref 3.5–5.0)
Anion gap: 14 (ref 5–15)
Anion gap: 12 (ref 5–15)
BUN: 24 mg/dL — ABNORMAL HIGH (ref 8–23)
BUN: 28 mg/dL — ABNORMAL HIGH (ref 8–23)
CO2: 26 mmol/L (ref 22–32)
CO2: 23 mmol/L (ref 22–32)
Calcium: 9.8 mg/dL (ref 8.9–10.3)
Calcium: 9.3 mg/dL (ref 8.9–10.3)
Chloride: 93 mmol/L — ABNORMAL LOW (ref 98–111)
Chloride: 95 mmol/L — ABNORMAL LOW (ref 98–111)
Creatinine, Ser: 3.76 mg/dL — ABNORMAL HIGH (ref 0.44–1.00)
Creatinine, Ser: 3.96 mg/dL — ABNORMAL HIGH (ref 0.44–1.00)
GFR calc Af Amer: 14 mL/min — ABNORMAL LOW (ref >60)
GFR calc Af Amer: 13 mL/min — ABNORMAL LOW (ref >60)
GFR calc non Af Amer: 12 mL/min — ABNORMAL LOW (ref >60)
GFR calc non Af Amer: 11 mL/min — ABNORMAL LOW (ref >60)
Glucose, Bld: 112 mg/dL — ABNORMAL HIGH (ref 70–99)
Glucose, Bld: 110 mg/dL — ABNORMAL HIGH (ref 70–99)
Phosphorus: 4 mg/dL (ref 2.5–4.6)
Phosphorus: 5.1 mg/dL — ABNORMAL HIGH (ref 2.5–4.6)
Potassium: 4.2 mmol/L (ref 3.5–5.1)
Potassium: 4.5 mmol/L (ref 3.5–5.1)
Sodium: 133 mmol/L — ABNORMAL LOW (ref 135–145)
Sodium: 130 mmol/L — ABNORMAL LOW (ref 135–145)

## 2018-12-27 LAB — PROTIME-INR
INR: 1.2 (ref 0.8–1.2)
Prothrombin Time: 15 seconds (ref 11.4–15.2)

## 2018-12-27 LAB — GLUCOSE, CAPILLARY
Glucose-Capillary: 108 mg/dL — ABNORMAL HIGH (ref 70–99)
Glucose-Capillary: 93 mg/dL (ref 70–99)
Glucose-Capillary: 105 mg/dL — ABNORMAL HIGH (ref 70–99)
Glucose-Capillary: 72 mg/dL (ref 70–99)

## 2018-12-27 LAB — HEPARIN LEVEL (UNFRACTIONATED): Heparin Unfractionated: 0.17 IU/mL — ABNORMAL LOW (ref 0.30–0.70)

## 2018-12-27 LAB — POCT ACTIVATED CLOTTING TIME: Activated Clotting Time: 246 seconds

## 2018-12-27 SURGERY — LOWER EXTREMITY ANGIOGRAPHY
Anesthesia: LOCAL | Laterality: Right

## 2018-12-27 MED ORDER — SODIUM CHLORIDE 0.9 % IV SOLN: 100.0000 mL | INTRAVENOUS | Status: DC | PRN

## 2018-12-27 MED ORDER — HEPARIN SODIUM (PORCINE) 1000 UNIT/ML IJ SOLN
INTRAMUSCULAR | Status: DC | PRN
  Administered 2018-12-27: 8000 [IU] via INTRAVENOUS

## 2018-12-27 MED ORDER — LIDOCAINE-PRILOCAINE 2.5-2.5 % EX CREA
1.0000 "application " | TOPICAL_CREAM | CUTANEOUS | Status: DC | PRN
  Filled 2018-12-27: qty 5

## 2018-12-27 MED ORDER — LABETALOL HCL 5 MG/ML IV SOLN: 10.0000 mg | INTRAVENOUS | Status: DC | PRN

## 2018-12-27 MED ORDER — ACETAMINOPHEN 325 MG PO TABS: 650.0000 mg | ORAL_TABLET | ORAL | Status: DC | PRN

## 2018-12-27 MED ORDER — ALTEPLASE 2 MG IJ SOLR: 2.0000 mg | Freq: Once | INTRAMUSCULAR | Status: DC | PRN

## 2018-12-27 MED ORDER — MIDAZOLAM HCL 2 MG/2ML IJ SOLN
INTRAMUSCULAR | Status: DC | PRN
  Administered 2018-12-27 (×2): 1 mg via INTRAVENOUS

## 2018-12-27 MED ORDER — FENTANYL CITRATE (PF) 100 MCG/2ML IJ SOLN
INTRAMUSCULAR | Status: AC
  Filled 2018-12-27: qty 2

## 2018-12-27 MED ORDER — HEPARIN (PORCINE) IN NACL 1000-0.9 UT/500ML-% IV SOLN
INTRAVENOUS | Status: DC | PRN
  Administered 2018-12-27 (×3): 500 mL

## 2018-12-27 MED ORDER — IODIXANOL 320 MG/ML IV SOLN
INTRAVENOUS | Status: DC | PRN
  Administered 2018-12-27: 210 mL via INTRA_ARTERIAL

## 2018-12-27 MED ORDER — HEPARIN SODIUM (PORCINE) 1000 UNIT/ML IJ SOLN
INTRAMUSCULAR | Status: AC
  Filled 2018-12-27: qty 1

## 2018-12-27 MED ORDER — SODIUM CHLORIDE 0.9 % IV SOLN: 250.0000 mL | INTRAVENOUS | Status: DC | PRN

## 2018-12-27 MED ORDER — CHLORHEXIDINE GLUCONATE CLOTH 2 % EX PADS
6.0000 | MEDICATED_PAD | Freq: Every day | CUTANEOUS | Status: DC
  Administered 2018-12-27 – 2018-12-30 (×4): 6 via TOPICAL

## 2018-12-27 MED ORDER — SODIUM CHLORIDE 0.9% FLUSH
3.0000 mL | Freq: Two times a day (BID) | INTRAVENOUS | Status: DC
  Administered 2018-12-27 (×2): 3 mL via INTRAVENOUS

## 2018-12-27 MED ORDER — LIDOCAINE HCL (PF) 1 % IJ SOLN
INTRAMUSCULAR | Status: DC | PRN
  Administered 2018-12-27: 15 mL

## 2018-12-27 MED ORDER — HEPARIN (PORCINE) IN NACL 1000-0.9 UT/500ML-% IV SOLN
INTRAVENOUS | Status: AC
  Filled 2018-12-27: qty 500

## 2018-12-27 MED ORDER — MIDAZOLAM HCL 2 MG/2ML IJ SOLN
INTRAMUSCULAR | Status: AC
  Filled 2018-12-27: qty 2

## 2018-12-27 MED ORDER — HYDRALAZINE HCL 20 MG/ML IJ SOLN: 5.0000 mg | INTRAMUSCULAR | Status: DC | PRN

## 2018-12-27 MED ORDER — PENTAFLUOROPROP-TETRAFLUOROETH EX AERO: 1.0000 "application " | INHALATION_SPRAY | CUTANEOUS | Status: DC | PRN

## 2018-12-27 MED ORDER — APIXABAN 2.5 MG PO TABS
2.5000 mg | ORAL_TABLET | Freq: Two times a day (BID) | ORAL | Status: DC
  Administered 2018-12-28 – 2018-12-31 (×7): 2.5 mg via ORAL
  Filled 2018-12-27 (×7): qty 1

## 2018-12-27 MED ORDER — FENTANYL CITRATE (PF) 100 MCG/2ML IJ SOLN
INTRAMUSCULAR | Status: DC | PRN
  Administered 2018-12-27 (×3): 25 ug via INTRAVENOUS

## 2018-12-27 MED ORDER — LIDOCAINE-PRILOCAINE 2.5-2.5 % EX CREA: 1.0000 "application " | TOPICAL_CREAM | CUTANEOUS | Status: DC | PRN

## 2018-12-27 MED ORDER — ASPIRIN EC 81 MG PO TBEC
81.0000 mg | DELAYED_RELEASE_TABLET | Freq: Every day | ORAL | Status: DC
  Administered 2018-12-28 – 2018-12-31 (×4): 81 mg via ORAL
  Filled 2018-12-27 (×4): qty 1

## 2018-12-27 MED ORDER — LIDOCAINE HCL (PF) 1 % IJ SOLN: 5.0000 mL | INTRAMUSCULAR | Status: DC | PRN

## 2018-12-27 MED ORDER — LIDOCAINE HCL (PF) 1 % IJ SOLN
INTRAMUSCULAR | Status: AC
  Filled 2018-12-27: qty 30

## 2018-12-27 MED ORDER — HEPARIN SODIUM (PORCINE) 1000 UNIT/ML DIALYSIS: 20.0000 [IU]/kg | INTRAMUSCULAR | Status: DC | PRN

## 2018-12-27 MED ORDER — OXYCODONE HCL 5 MG PO TABS
5.0000 mg | ORAL_TABLET | ORAL | Status: DC | PRN
  Administered 2018-12-27 – 2018-12-28 (×2): 5 mg via ORAL
  Administered 2018-12-28: 10 mg via ORAL
  Administered 2018-12-28: 22:00:00 5 mg via ORAL
  Administered 2018-12-29: 10 mg via ORAL
  Administered 2018-12-29 – 2018-12-30 (×2): 5 mg via ORAL
  Filled 2018-12-27: qty 2
  Filled 2018-12-27 (×3): qty 1
  Filled 2018-12-27: qty 2
  Filled 2018-12-27 (×2): qty 1

## 2018-12-27 MED ORDER — MORPHINE SULFATE (PF) 2 MG/ML IV SOLN: 2.0000 mg | INTRAVENOUS | Status: DC | PRN

## 2018-12-27 MED ORDER — SODIUM CHLORIDE 0.9% FLUSH: 3.0000 mL | INTRAVENOUS | Status: DC | PRN

## 2018-12-27 MED ORDER — HEPARIN SODIUM (PORCINE) 1000 UNIT/ML DIALYSIS: 1000.0000 [IU] | INTRAMUSCULAR | Status: DC | PRN

## 2018-12-27 SURGICAL SUPPLY — 31 items
BAG SNAP BAND KOVER 36X36 (MISCELLANEOUS) ×3 IMPLANT
BALLN MUSTANG 5X200X135 (BALLOONS) ×3
BALLOON MUSTANG 5X200X135 (BALLOONS) ×2 IMPLANT
CATH OMNI FLUSH 5F 65CM (CATHETERS) ×3 IMPLANT
CATH QUICKCROSS SUPP .035X90CM (MICROCATHETER) ×3 IMPLANT
CLOSURE MYNX CONTROL 6F/7F (Vascular Products) ×3 IMPLANT
COVER DOME SNAP 22 D (MISCELLANEOUS) ×3 IMPLANT
DEVICE TORQUE H2O (MISCELLANEOUS) ×3 IMPLANT
GUIDEWIRE ANGLED .035X260CM (WIRE) ×3 IMPLANT
KIT ENCORE 26 ADVANTAGE (KITS) ×3 IMPLANT
KIT MICROPUNCTURE NIT STIFF (SHEATH) ×6 IMPLANT
KIT PV (KITS) ×3 IMPLANT
PROTECTION STATION PRESSURIZED (MISCELLANEOUS) ×3
SHEATH FLEXOR ANSEL 1 7F 45CM (SHEATH) ×3 IMPLANT
SHEATH PINNACLE 5F 10CM (SHEATH) ×3 IMPLANT
SHEATH PINNACLE 7F 10CM (SHEATH) ×3 IMPLANT
SHEATH PROBE COVER 6X72 (BAG) ×6 IMPLANT
SHIELD RADPAD SCOOP 12X17 (MISCELLANEOUS) ×3 IMPLANT
STATION PROTECTION PRESSURIZED (MISCELLANEOUS) ×2 IMPLANT
STENT ELUVIA 6X120X130 (Permanent Stent) ×9 IMPLANT
STENT ELUVIA 6X40X130 (Permanent Stent) ×3 IMPLANT
STENT INNOVA 6X40X130 (Permanent Stent) ×3 IMPLANT
STOPCOCK MORSE 400PSI 3WAY (MISCELLANEOUS) ×3 IMPLANT
SYR MEDRAD MARK V 150ML (SYRINGE) ×3 IMPLANT
TAPE VIPERTRACK RADIOPAQ (MISCELLANEOUS) ×2 IMPLANT
TAPE VIPERTRACK RADIOPAQUE (MISCELLANEOUS) ×1
TRANSDUCER W/STOPCOCK (MISCELLANEOUS) ×3 IMPLANT
TRAY PV CATH (CUSTOM PROCEDURE TRAY) ×6 IMPLANT
TUBING CIL FLEX 10 FLL-RA (TUBING) ×3 IMPLANT
WIRE BENTSON .035X145CM (WIRE) ×3 IMPLANT
WIRE HI TORQ VERSACORE J 260CM (WIRE) ×3 IMPLANT

## 2018-12-27 NOTE — Progress Notes (Signed)
OT Cancellation Note  Patient Details Name: [name] MRN: [id number] DOB: 1956/08/08   Cancelled Treatment:    Reason Eval/Treat Not Completed: Patient at procedure or test/ unavailable(pt in surgery)  Malka So 12/27/2018, 12:05 PM  Nestor Lewandowsky, OTR/L Acute Rehabilitation Services Pager: [phone] Office: [phone]

## 2018-12-27 NOTE — Op Note (Signed)
Patient name: Kristen Berry MRN: [id number] DOB: February 27, 1956 Sex: female  12/21/2018 - 12/27/2018 Pre-operative Diagnosis: ESRD, right toe wound Post-operative diagnosis:  Same Surgeon:  Annamarie Major Procedure Performed:  1.  U/s guided access, left cephalic vein fistula  2.  fistulogram  3.  Ultrasound-guided access, left femoral artery  4.  Abdominal aortogram  5.  Bilateral lower extremity runoff  6.  Stent, right superficial femoral-popliteal artery  7.  Closure device (Mynx)  8.  Conscious sedation (116 minutes)   Indications:  The patient has a non-maturing left BCF and is recently s/p right great toe amputation  Fistulogram Procedure:  The patient was identified in the holding area and taken to room 8.  The patient was then placed supine on the table and prepped and draped in the usual sterile fashion.  A time out was called.  Conscious sedation was administered with the use of IV fentanyl and Versed under continuous physician and nurse monitoring.  Heart rate, blood pressure, and oxygen saturations were continuously monitored.  Total sedation time was 116 minutes.   Ultrasound was used to evaluate the fistula.  The vein was patent and compressible.  A digital ultrasound image was acquired.  The fistula was then accessed under ultrasound guidance using a micropuncture needle.  An 018 wire was then asvanced without resistance and a micropuncture sheath was placed.  Contrast injections were then performed through the sheath.  Findings:  The central venous system is widely patent.  The cephalic vein fistula is widely patent with one branch near the arm crease.     Intervention:  No intervention was performed.  The cannulation site was closed with a 2-0 nylon   Lower Extremity angiogram Procedure:  The patient was identified in the holding area and taken to room 8.  The patient was then placed supine on the table and prepped and draped in the usual sterile fashion.  A time out was  called.  Ultrasound was used to evaluate the left common femoral artery.  It was patent .  A digital ultrasound image was acquired.  A micropuncture needle was used to access the left common femoral artery under ultrasound guidance.  An 018 wire was advanced without resistance and a micropuncture sheath was placed.  The 018 wire was removed and a benson wire was placed.  The micropuncture sheath was exchanged for a 5 french sheath.  An omniflush catheter was advanced over the wire to the level of L-1.  An abdominal angiogram was obtained.  Next, using the omniflush catheter and a benson wire, the aortic bifurcation was crossed and the catheter was placed into theright external iliac artery and right runoff was obtained.  left runoff was performed via retrograde sheath injections.  Findings:   Aortogram: No significant renal artery stenosis.  The infrarenal abdominal aorta is widely patent.  Bilateral common and external iliac arteries widely patent.  Right Lower Extremity: The right common femoral and profundofemoral artery are patent throughout their course.  The superficial femoral artery is patent proximally however occludes in the midportion and reconstitutes the above-knee popliteal artery.  There is single-vessel runoff via the anterior tibial artery.  Poor opacification of the foot.  Left Lower Extremity: The left common femoral profundofemoral artery are widely patent.  The stents within the superficial femoral artery are patent however there does appear to be areas of stenosis at the proximal and distal end as well as within the stented segment.  The main runoff is  via the anterior tibial artery  Intervention: After the above images were acquired the decision was made to proceed with intervention.  Over a 035 wire, a 7 French 45 cm sheath was advanced into the right external iliac artery.  The patient was fully heparinized.  Using a 035 Glidewire and a quick cross catheter, subintimal recanalization  was performed.  There was successful reentry in the above-knee popliteal artery which was confirmed with contrast injections.  A versa core wire was then inserted.  I then predilated the occluded segment with a 5 x 1 to 200 balloon.  I then elected to primarily stent this area.  I deployed 3 Elluvia 6 x 120 overlapping stents and a proximal 6 x 40 Elluvia.  They were postdilated with a 5 x 200 balloon.  Completion imaging showed inline flow through the superficial femoral-popliteal artery.  There was an irregularity at the distal aspect of the stents.  I elected to extend the stents using a INNOVA 6 x 40 stent which was postdilated with a 5 mm balloon.  Completion imaging showed another irregularity between the patella and the distal edge of the stent which I elected to treat with primary balloon angioplasty for 2 minutes at low inflation pressure with a 5 mm balloon.  This was done for 2 minutes and afterwards, completion imaging showed no residual stenosis.  There was inline flow through the anterior tibial artery which revealed much better opacification of the foot.  At this point the long sheath was exchanged out for a short 7 French sheath and a minx device was used for closure.  There were no immediate complications.     Impression:  #1  Left BCF is widely patent. She will need elevation of the fistula and branch ligation.  #2  Occluded right superficial femoral artery successfully crossed and treated using overlapping 6 mm Elluvia stents.  #3  Single-vessel runoff via the anterior tibial artery on the right  #4  Significant in-stent stenosis on the left.    Theotis Burrow, M.D., [facility] [place] Office: [phone] Pager:  [phone]

## 2018-12-27 NOTE — Progress Notes (Signed)
Pharmacy Antibiotic Note  Kristen Berry is a 63 y.o. female admitted on 12/21/2018.  Pharmacy has been consulted for Vanco/Fortaz dosing. Necrotic R big toe with redness and drainage. 1rst ray amputation on 3/27 of right great toe w/ osteomyelitis. [name] stopped 24 hours after surgery. WBC stable, last HD session yesterday, 3/31 - vancomycin given afterwards.  Vanco 3/19>> (4/8) [name] 3/19>>3/28  Plan: Continue Vanco 1g IV q HD TTS for right leg cellulitis  Monitor pre-HD level as needed and patient clinical status   Height: 5\' 8"  (172.7 cm) Weight: 186 lb 11.7 oz (84.7 kg) IBW/kg (Calculated) : 63.9  Temp (24hrs), Avg:97.9 F (36.6 C), Min:97.4 F (36.3 C), Max:98.3 F (36.8 C)  Recent Labs  Lab 12/23/18 0743 12/24/18 0500 12/25/18 0548 12/26/18 0727 12/27/18 0452  WBC 13.8* 13.8* 11.4* 10.9* 13.3*  CREATININE 3.81* 3.19* 4.31* 5.20* 3.76*    Estimated Creatinine Clearance: 17.5 mL/min (A) (by C-G formula based on SCr of 3.76 mg/dL (H)).    Allergies  Allergen Reactions  . Crestor [Rosuvastatin Calcium] Other (See Comments)    Leg pain    Janae Bridgeman, PharmD PGY1 Pharmacy Resident Phone: [phone] 12/27/2018 3:16 PM

## 2018-12-27 NOTE — Progress Notes (Signed)
Pt ordered to have COVID testing done per requirement to be placed at [facility].  Confirmed correct order placed. Per Dr. Novella Olive with Infectious Disease, as well as PA ordering test, placing pt on contact/droplet precautions is not needed. Pt is asymptomatic and test only being ordered to satisfy SNIF requirements.---Velora Mediate

## 2018-12-27 NOTE — Progress Notes (Signed)
[place] for Heparin Indication: h/o DVT and CVA   Allergies  Allergen Reactions  . Crestor [Rosuvastatin Calcium] Other (See Comments)    Leg pain    Patient Measurements: Height: 5\' 8"  (172.7 cm) Weight: 179 lb 7.3 oz (81.4 kg) IBW/kg (Calculated) : 63.9 Heparin Dosing Weight: 80kg  Vital Signs: Temp: 98.3 F (36.8 C) (04/01 0529) Temp Source: Oral (04/01 0529) BP: 128/44 (04/01 0529) Pulse Rate: 74 (04/01 0529)  Labs: Recent Labs    12/25/18 0548  12/26/18 0500 12/26/18 0727 12/26/18 1952 12/27/18 0452  HGB 11.1*  --   --  10.6*  --   --   HCT 34.8*  --   --  33.3*  --   --   PLT 488*  --   --  531*  --   --   LABPROT  --   --   --   --   --  15.0  INR  --   --   --   --   --  1.2  HEPARINUNFRC 0.26*   < > 1.06*  --  0.14* 0.17*  CREATININE 4.31*  --   --  5.20*  --   --    < > = values in this interval not displayed.    Estimated Creatinine Clearance: 12.4 mL/min (A) (by C-G formula based on SCr of 5.2 mg/dL (H)).   Medical History: Past Medical History:  Diagnosis Date  . Anemia   . Arthritis    "hands, knees" (10/10/2017)  . Asthma   . CHF (congestive heart failure) ([place])   . Chronic kidney disease (CKD), stage IV (severe) ([place])    Dialysis T/ Th/ Sat  . Coronary artery disease   . H/O Clostridium difficile infection   . High cholesterol   . History of kidney stones   . Hypertension   . PVD (peripheral vascular disease) ([place])    "LLE; will have OR" (10/10/2017)  . Sleep apnea    "never given mask" (10/10/2017)  . Stroke [facility])    mild stroke mid April - 2019  . Type II diabetes mellitus ([place])    "no RX anymore" (10/10/2017)    Assessment:  41 yoF with h/o DVT and CVA s/p R great toe amputation, on Coumadin PTA but currently on hold in anticipation of angiography for next week. Pharmacy consulted to dose IV heparin.   4/1 AM update: heparin level low at 0.17 on 1300 units/hr of heparin, heparin level 1.06  yesterday likely an error  Goal of Therapy:  Heparin level 0.3-0.7 units/ml  Monitor platelets by anticoagulation protocol: Yes   Plan:  Inc heparin to 1400 units/hr Re-check heparin level at [place], PharmD, [place] Pharmacist Phone: [phone]

## 2018-12-27 NOTE — Interval H&P Note (Signed)
History and Physical Interval Note:  12/27/2018 11:22 AM  Kristen Berry  has presented today for surgery, with the diagnosis of pvd w/ grangren right foot - complication with fistula left arm.  The various methods of treatment have been discussed with the patient and family. After consideration of risks, benefits and other options for treatment, the patient has consented to  Procedure(s): LOWER EXTREMITY ANGIOGRAPHY - Right Lower (N/A) A/V FISTULAGRAM - Left Upper (N/A) as a surgical intervention.  The patient's history has been reviewed, patient examined, no change in status, stable for surgery.  I have reviewed the patient's chart and labs.  Questions were answered to the patient's satisfaction.     Annamarie Major

## 2018-12-27 NOTE — Progress Notes (Addendum)
[facility] Progress Note   [place] TTS  4h  3K/2.25 bath   81.5kg   [facility]J TDC  Hep 3000 w/ 3086midrun - no ESA or Fe  - calctiriol 0.75 Recent labs: hgb 11.4  ipTH 313  Assessment/Plan: 1.  R great toe osteomyelitis s/p 1st ray amp 3/27 with VAC -  Dr. Sharol Given -. On Vanc x 2 weeks revascularization- VVS to do arteriorgram today - WBC slightly 2.  ESRD -  TTS -K 4.2 - next HD Thursday 3.  Hypertension/volume  - titrate volume with HD - prone to large IDWG net UF 2.6 Tuesday post wt 81.4 4.  Anemia  - hgb 11.7 after HD Tuesday - no ESA 5.  Metabolic bone disease -  continue VDRA/phoslo- Ca creeping up - watch - may need non C based binder  6.  Nutrition - renal carb mod diet/vits 7.  Lack of permanent dialysis access - seen 10/31/18 - needs f'gram before proceding - was planned for 2/11 but it was cancelled after waiting all day due to a vascular emergency -planning for f'gram today 8. Hx of bed bugs  9. Hx CVA/CAD 10. Hx DVT -on hold due to arteriography  Myriam Jacobson, PA-C [place] [phone] 12/27/2018,9:10 AM  LOS: 6 days   Pt seen, examined and agree w A/P as above.  [facility] 12/27/2018, 3:41 PM      Subjective:   No c/o today. Did sign off early from dialysis due to right foot pain.  Objective Vitals:   12/26/18 1643 12/26/18 2219 12/27/18 0200 12/27/18 0529  BP: (!) 127/47 (!) 135/48  (!) 128/44  Pulse: 82 89  74  Resp: 18 16  18   Temp: 97.9 F (36.6 C) 98 F (36.7 C)  98.3 F (36.8 C)  TempSrc: Oral Oral  Oral  SpO2: 100% 99% 98% 97%  Weight:      Height:       Physical Exam General: NAD  Heart: RRR  Lungs: no rales Abdomen: + BS soft NT  Extremities:right foot ray amp with wound VAC  Dialysis Access: TDC right IJ and poorly maturing AVF    Additional Objective Labs: Basic Metabolic Panel: Recent Labs  Lab 12/24/18 0500 12/25/18 0548 12/26/18 0727 12/27/18 0452  NA 131* 130* 131*  133*  K 3.8 4.2 3.7 4.2  CL 93* 93* 94* 93*  CO2 22 22 23 26   GLUCOSE 129* 97 132* 112*  BUN 20 35* 46* 24*  CREATININE 3.19* 4.31* 5.20* 3.76*  CALCIUM 9.1 8.9 9.1 9.8  PHOS 4.0  --  4.9* 4.0   Liver Function Tests: Recent Labs  Lab 12/21/18 1732  12/24/18 0500 12/26/18 0727 12/27/18 0452  AST 16  --   --   --   --   ALT 10  --   --   --   --   ALKPHOS 108  --   --   --   --   BILITOT 0.5  --   --   --   --   PROT 8.1  --   --   --   --   ALBUMIN 3.4*   < > 3.1* 2.9* 3.1*   < > = values in this interval not displayed.   No results for input(s): LIPASE, AMYLASE in the last 168 hours. CBC: Recent Labs  Lab 12/21/18 1732  12/23/18 0743 12/24/18 0500 12/25/18 0548 12/26/18 0727 12/27/18 0452  WBC 12.0*   < >  13.8* 13.8* 11.4* 10.9* 13.3*  NEUTROABS 8.6*  --   --   --   --   --   --   HGB 12.4   < > 11.9* 11.6* 11.1* 10.6* 11.7*  HCT 38.6   < > 37.3 36.3 34.8* 33.3* 35.2*  MCV 101.6*   < > 102.8* 102.0* 102.1* 101.2* 102.0*  PLT 602*   < > 582* 538* 488* 531* 507*   < > = values in this interval not displayed.   Blood Culture    Component Value Date/Time   SDES BLOOD LEFT HAND 10/19/2017 1500   SPECREQUEST  10/19/2017 1500    BOTTLES DRAWN AEROBIC AND ANAEROBIC Blood Culture adequate volume   CULT NO GROWTH 5 DAYS 10/19/2017 1500   REPTSTATUS 10/24/2017 FINAL 10/19/2017 1500    Cardiac Enzymes: No results for input(s): CKTOTAL, CKMB, CKMBINDEX, TROPONINI in the last 168 hours. CBG: Recent Labs  Lab 12/25/18 2138 12/26/18 1136 12/26/18 1647 12/26/18 2227 12/27/18 0638  GLUCAP 153* 151* 162* 152* 108*   Iron Studies: No results for input(s): IRON, TIBC, TRANSFERRIN, FERRITIN in the last 72 hours. Lab Results  Component Value Date   INR 1.2 12/27/2018   INR 1.7 (H) 12/22/2018   INR 1.6 (H) 12/21/2018   Studies/Results: No results found. Medications: . sodium chloride    . sodium chloride    . heparin 1,400 Units/hr (12/27/18 0548)  . vancomycin  Stopped (12/26/18 1100)   . acetaminophen  650 mg Oral Q6H   Or  . acetaminophen  650 mg Rectal Q6H  . amLODipine  5 mg Oral QPM  . atorvastatin  20 mg Oral q1800  . calcitRIOL  0.75 mcg Oral Q T,Th,Sa-HD  . calcium acetate  667 mg Oral BID WC  . carvedilol  25 mg Oral BID  . Chlorhexidine Gluconate Cloth  6 each Topical Q0600  . Chlorhexidine Gluconate Cloth  6 each Topical Q0600  . gabapentin  100 mg Oral QHS  . hydrALAZINE  25 mg Oral BID  . insulin aspart  0-9 Units Subcutaneous TID WC  . multivitamin  1 tablet Oral QHS  . pentoxifylline  400 mg Oral Q supper  . polyethylene glycol  17 g Oral Daily  . ramelteon  8 mg Oral QHS  . senna-docusate  1 tablet Oral QHS  . sodium chloride flush  3 mL Intravenous Q12H  . ticagrelor  90 mg Oral BID

## 2018-12-27 NOTE — Progress Notes (Addendum)
**  COVID test was ordered as acceptance criteria for SNF that demanded neg COVID before accepting patient, we do not have any clinical suspicion of COVID at this time.  ID consulted prior and Floor RN team notified verbally.  Confirmed with ID we do not need contact precautions as this is solely for snf admission  -Dr. Criss Rosales

## 2018-12-27 NOTE — Progress Notes (Signed)
Family Medicine Teaching Service Daily Progress Note Intern Pager: [phone]  Patient name: Kristen Berry Medical record number: [id number] Date of birth: 09/07/1956 Age: 63 y.o. Gender: female  Primary Care Provider: Martinique, Sarah T, MD Consultants: Orthopedics, Nephrology Code Status: DNR  Pt Overview and Major Events to Date:  Kristen Berry [facility] a 63 y.o.femalepresenting with osteomyelitis of the right great toe.[name] PMH is significant forosteomyelitis of the left toes s/p amputation, ESRD, diabetes type 2, hypertension, history of DVT, history of stroke, CHF, MGUS  Assessment and Plan:  Osteomyelitis of Right Great toe S/P ray amp on 3/27. Wound vac in place with good seal. Follow up with Dr. Sharol Given in one week. Patient to undergo arteriogram this am to evaluate vessel patency of RLE. Presumed osteo 2/2 to poor wound healing 2/2 poor blood flow. Continue vanc w/ dialysis until 4/8. Continue heparin gttt, trental,  brilinta until procedure. - continue vanc, pharmacy to dose, with dialysis until 4/8 - tylenol as needed - oxycodone 5mg  q 6 hours - wound vac and wound management per ortho and wound care  Right leg cellulitis Afebrile, wbc 13.3 from 10.9. Continue vanc with dialysis until 4/8 as above. Continue leg wrapping.  ESRD on HDTThS, chronic, stable: Dialysis TTS. HD 4/2. Dialyzes through R IJ HD cath. Will undergo fistulagram 4/1 to determine viability. Per patient report this was placed around 1 year ago. Electrolyte management per nephro. - dialysis per nephro -Renally dose medications  -Continue PhosLo  Diabetes mellitus type 2: A1c 5.5% 12/22/2018.Most recent CBGs 112-162 requiring only 4U of aspart in past 24 hours. Diet controlled. -sSSI, CBGs q4 hours  HFrEF:January 2019 patient had echo that showed LVEF 35 to 40%. Septal and apical akinesis, moderately dilated LV, mild LVH. -Continue HD. No other tx at this time.  CAD: left heart cath in February 2019 showed  severe diffuse diabetic coronary disease, 70 to 80% stenosis of proximal LAD 75% obstruction of RCA -Continue heparin drip, Brilinta, and Trental  Mild asthma: since childhood. Hasn't usedher albuterolin months. No other medications. -Hold albuterol  HTN, chronic, stable: BP 128/44. Takes amlodipine 5mg , Coreg 25mg  BID, hydralazine 25mg  BID at home. -Continue home meds  HLD: pt takes 20mg  lipitor at home. Last Lipid panel January 2019 with Trig 180, HDL 39, and LDL 109. -Continue Lipitor  MGUS: diagnosed 2017. Patient not currently on any medication. [facility]  History of left leg DVT:pt takes warfarin 4mg  TTSS/5mg  MWF.Patient has history of DVT in left leg. Unknown when she first started warfarin, has been at least 1 year. Patient currently subtherapeutic with INR of 1.6.  -Continue Brilinta, Trental, heparin gtt; hold Warfarin for bridging s/p Arteriography/Fistulogram Wednesday 04/01   Hx of stroke: patient states she had a stroke April 2019. Was treated at [facility] in [place]. Does not appear to have any residual deficits. Patient takes Brilinta 90 mg twice daily.  -Continue Brilinta, Trental, heparin gtt; hold Warfarin s/p Arteriography/Fistulogram Wednesday 04/01   FEN/GI: Renal w/ fluid restricted Prophylaxis: Heparin gtt, Brilinta, Trental  Disposition: Discharge once stable s/p Arteriography/Fistulogram on 04/01  FEN/GI: Renal diet w/fluid restriction PPx: Heparin gtt  Disposition: discharge  Subjective:  Feeling well this am. Anxious about procedure. She is trying to have a positive attitude and roll with the punches.  Objective: Temp:  [97.6 F (36.4 C)-98.3 F (36.8 C)] 98.3 F (36.8 C) (04/01 0529) Pulse Rate:  [74-89] 74 (04/01 0529) Resp:  [16-18] 18 (04/01 0529) BP: (  112-147)/(41-67) 128/44 (04/01 0529) SpO2:  [97 %-100 %] 97 % (04/01 0529) Weight:  [81.4 kg] 81.4 kg (03/31  1056) Physical Exam: Gen: 63 year old caucasian female, resting comfortably, no acute distress CV: R IJ HD cath in place. Rrr, no m/r/g. Weak thrill LUE at AV fistula site Resp: CTAB, no wheezing, rales, or rhonchi, comfortable work of breathing MSK: wound vac in place over right great toe, good suction. RLE wrapped with ace bandage. Skin: warm, dry, intact, no rashes  Laboratory: Recent Labs  Lab 12/25/18 0548 12/26/18 0727 12/27/18 0452  WBC 11.4* 10.9* 13.3*  HGB 11.1* 10.6* 11.7*  HCT 34.8* 33.3* 35.2*  PLT 488* 531* 507*   Recent Labs  Lab 12/21/18 1732  12/25/18 0548 12/26/18 0727 12/27/18 0452  NA 135   < > 130* 131* 133*  K 3.7   < > 4.2 3.7 4.2  CL 98   < > 93* 94* 93*  CO2 25   < > 22 23 26   BUN 7*   < > 35* 46* 24*  CREATININE 1.83*   < > 4.31* 5.20* 3.76*  CALCIUM 8.8*   < > 8.9 9.1 9.8  PROT 8.1  --   --   --   --   BILITOT 0.5  --   --   --   --   ALKPHOS 108  --   --   --   --   ALT 10  --   --   --   --   AST 16  --   --   --   --   GLUCOSE 90   < > 97 132* 112*   < > = values in this interval not displayed.   Hep B surface antigen: negative  Imaging/Diagnostic Tests: 3/26 - DG Right Foot: IMPRESSION: 1. Osteomyelitis of the distal phalanx of the right great toe involving the distal tuft and lateral aspect of the proximal to mid phalanx. 2. There is surrounding soft tissue swelling, without soft tissue air. 3. No fracture, dislocation or other evidence of osteomyelitis.  [name], MD 12/27/2018, 9:40 AM PGY-2, [place] Intern pager: [phone], text pages welcome

## 2018-12-27 NOTE — Progress Notes (Signed)
PT Cancellation Note  Patient Details Name: Kristen Berry MRN: [id number] DOB: 09-03-1956   Cancelled Treatment:    Reason Eval/Treat Not Completed: Patient at procedure or test/unavailable. Pt currently in Cath Lab. Will continue to follow.    [name] 12/27/2018, 12:58 PM   [name], PT, DPT Acute Rehabilitation Services Pager: [phone] Office: [phone]

## 2018-12-27 NOTE — Progress Notes (Addendum)
Family Medicine progress update  Discussed case with Dr. Trula Slade from vascular surgery. Planned fistula elevation procedure will occur as an outpatient. Will hold anticoag until am 4/2. Will start apixaban and continue DAPT with brilinta and aspirin. Will likely need to be stopped 48 hours prior to any future ortho or vascular surgeries.  [name] MD PGY-2 Family Medicine Resident

## 2018-12-27 NOTE — Clinical Social Work Note (Signed)
Patient in need of ST rehab and the facility search process has been initiated. Kristen Berry lives in [place] [facility] and goes to dialysis TTS at [facility]. Contact made with the following skilled nursing facilities:  1. [facility] and [facility]: Declined. 2. [facility]: Talked with Arbie Cookey, admissions director and after reviewing her clinicals, declined patient as they cannot transport her to dialysis. They do not have a contract with [facility], therefore this service cannot come to their facility to pick-up patient. 3. [facility]: Talked with [place] transitions nurse [phone])  with [facility]. They will need a negative COV-19 test result and [name]'s dialysis days have to be changed from TTS to MWF as they do not transport on TTS. *Talked with MD and advised him of need for patient to be tested.  Patient transferred from 75M to 4E and [place] contacted and updated.  Kristen Berry, MSW, LCSW Licensed Clinical Social Worker [place] [phone]

## 2018-12-28 ENCOUNTER — Encounter (HOSPITAL_COMMUNITY): Payer: Self-pay | Admitting: Surgery

## 2018-12-28 DIAGNOSIS — N186 End stage renal disease: Secondary | ICD-10-CM

## 2018-12-28 DIAGNOSIS — T82898A Other specified complication of vascular prosthetic devices, implants and grafts, initial encounter: Secondary | ICD-10-CM

## 2018-12-28 DIAGNOSIS — Z992 Dependence on renal dialysis: Secondary | ICD-10-CM

## 2018-12-28 LAB — RENAL FUNCTION PANEL
Albumin: 3 g/dL — ABNORMAL LOW (ref 3.5–5.0)
Anion gap: 14 (ref 5–15)
BUN: 37 mg/dL — ABNORMAL HIGH (ref 8–23)
CO2: 21 mmol/L — ABNORMAL LOW (ref 22–32)
Calcium: 9.2 mg/dL (ref 8.9–10.3)
Chloride: 94 mmol/L — ABNORMAL LOW (ref 98–111)
Creatinine, Ser: 4.46 mg/dL — ABNORMAL HIGH (ref 0.44–1.00)
GFR calc Af Amer: 11 mL/min — ABNORMAL LOW (ref >60)
GFR calc non Af Amer: 10 mL/min — ABNORMAL LOW (ref >60)
Glucose, Bld: 90 mg/dL (ref 70–99)
Phosphorus: 5.4 mg/dL — ABNORMAL HIGH (ref 2.5–4.6)
Potassium: 4.5 mmol/L (ref 3.5–5.1)
Sodium: 129 mmol/L — ABNORMAL LOW (ref 135–145)

## 2018-12-28 LAB — CBC
HCT: 35.2 % — ABNORMAL LOW (ref 36.0–46.0)
Hemoglobin: 11.3 g/dL — ABNORMAL LOW (ref 12.0–15.0)
MCH: 32.5 pg (ref 26.0–34.0)
MCHC: 32.1 g/dL (ref 30.0–36.0)
MCV: 101.1 fL — ABNORMAL HIGH (ref 80.0–100.0)
Platelets: 477 10*3/uL — ABNORMAL HIGH (ref 150–400)
RBC: 3.48 MIL/uL — ABNORMAL LOW (ref 3.87–5.11)
RDW: 14.8 % (ref 11.5–15.5)
WBC: 13 10*3/uL — ABNORMAL HIGH (ref 4.0–10.5)
nRBC: 0 % (ref 0.0–0.2)

## 2018-12-28 LAB — GLUCOSE, CAPILLARY
Glucose-Capillary: 101 mg/dL — ABNORMAL HIGH (ref 70–99)
Glucose-Capillary: 96 mg/dL (ref 70–99)
Glucose-Capillary: 119 mg/dL — ABNORMAL HIGH (ref 70–99)
Glucose-Capillary: 109 mg/dL — ABNORMAL HIGH (ref 70–99)

## 2018-12-28 MED ORDER — CALCITRIOL 0.5 MCG PO CAPS
ORAL_CAPSULE | ORAL | Status: AC
  Administered 2018-12-28: 10:00:00 0.75 ug via ORAL
  Filled 2018-12-28: qty 1

## 2018-12-28 MED ORDER — CALCITRIOL 0.25 MCG PO CAPS
ORAL_CAPSULE | ORAL | Status: AC
  Filled 2018-12-28: qty 1

## 2018-12-28 MED ORDER — DARBEPOETIN ALFA 60 MCG/0.3ML IJ SOSY
PREFILLED_SYRINGE | INTRAMUSCULAR | Status: AC
  Filled 2018-12-28: qty 0.3

## 2018-12-28 MED ORDER — HEPARIN SODIUM (PORCINE) 1000 UNIT/ML IJ SOLN
INTRAMUSCULAR | Status: AC
  Administered 2018-12-28: 11:00:00
  Filled 2018-12-28: qty 4

## 2018-12-28 MED ORDER — VANCOMYCIN HCL IN DEXTROSE 1-5 GM/200ML-% IV SOLN
INTRAVENOUS | Status: AC
  Filled 2018-12-28: qty 200

## 2018-12-28 MED FILL — Lidocaine HCl Local Preservative Free (PF) Inj 1%: INTRAMUSCULAR | Qty: 30 | Status: AC

## 2018-12-28 NOTE — Progress Notes (Signed)
Per chart review, Covid-19 test is only to meet SNF admission requirements; multiple MDs and ID team have charted no clinical concern for Covid or isolation precautions at this time. PT to continue to follow acutely.   Deniece Ree PT, DPT, CBIS  Supplemental Physical Therapist [facility]    Pager [phone] Acute Rehab Office [phone]

## 2018-12-28 NOTE — Progress Notes (Signed)
Dialysis Coordinator received call back from [facility] clinic secretary who states they are aware that patient will be discharging to [facility] and that they will accommodate her MWF schedule needs in order for her to be transported to and from the SNF. DC update CSW.   Alphonzo Cruise Dialysis Coordinator [phone]

## 2018-12-28 NOTE — Progress Notes (Signed)
Dialysis Coordinator received call from CSW/Cynthia who informed DC that patient will be going to [facility] pending COVID 19 results and needs OP HD schedule changed from TTS to MWF due to transportation from the facility. DC left message for [facility] Manager/Kammy to discuss this need, requesting a return call as soon as possible. DC will continue to follow until patient need has been resolved.  Alphonzo Cruise Dialysis Coordinator [phone]

## 2018-12-28 NOTE — Care Management Important Message (Signed)
Important Message  Patient Details  Name: Kristen Berry MRN: [id number] Date of Birth: 05-27-1956   Medicare Important Message Given:  Yes    [name] 12/28/2018, 3:17 PM

## 2018-12-28 NOTE — Progress Notes (Signed)
Family Medicine Teaching Service Daily Progress Note Intern Pager: [phone]  Patient name: Kristen Berry Medical record number: [id number] Date of birth: December 11, 1955 Age: 63 y.o. Gender: female  Primary Care Provider: [place], Sarah T, MD Consultants: Orthopedics, Nephrology Code Status: DNR  Pt Overview and Major Events to Date:  Kristen Berry [facility] a 63 y.o.femalepresenting with osteomyelitis of the right great toe.[name] PMH is significant forosteomyelitis of the left toes s/p amputation, ESRD, diabetes type 2, hypertension, history of DVT, history of stroke, CHF, MGUS. Patient is medically stable and ready for discharge to snf when covid testing results as negative.  Assessment and Plan:  Osteomyelitis of Right Great toe S/P ray amp on 3/27. Wound vac in place with good seal. Follow up with Dr. Sharol Given in one week.  Poor blood flow with stenting of RLE by vascular. Start eliquis, continue brilinta and aspirin.. - continue vanc, pharmacy to dose, with dialysis until 4/8 - tylenol as needed - oxycodone 5mg  q 6 hours - wound vac and wound management per ortho and wound care  Right leg cellulitis Afebrile, wbc 13 from 10.9. Continue vanc with dialysis until 4/8 as above. Continue leg wrapping.  ESRD on HDTThS, chronic, stable: Dialysis TTS. HD 4/2. Dialyzes through R IJ HD cath. Will undergo fistulagram 4/1 to determine viability. Per patient report this was placed around 1 year ago. Electrolyte management per nephro. - Dialysis per nephro - Renally dose medications  - Continue PhosLo  Diabetes mellitus type 2: A1c 5.5% 12/22/2018.Most recent CBGs 112-162 requiring only 4U of aspart in past 24 hours. Diet controlled. -sSSI, CBGs q4 hours  HFrEF:January 2019 patient had echo that showed LVEF 35 to 40%. Septal and apical akinesis, moderately dilated LV, mild LVH. -Continue HD. No other tx at this time.  CAD: left heart cath in February 2019 showed severe diffuse diabetic coronary  disease, 70 to 80% stenosis of proximal LAD 75% obstruction of RCA -Continue eliquis, Brilinta, and Trental  HTN, chronic, stable: BP 115/56. Takes amlodipine 5mg , Coreg 25mg  BID, hydralazine 25mg  BID at home. -Continue home meds  HLD: pt takes 20mg  lipitor at home. Last Lipid panel January 2019 with Trig 180, HDL 39, and LDL 109. -Continue Lipitor  History of left leg DVT:pt takes warfarin 4mg  TTSS/5mg  MWF.Patient has history of DVT in left leg. Unknown when she first started warfarin, has been at least 1 year. Patient currently subtherapeutic with INR of 1.6.  -Continue Brilinta, Trental, heparin gtt; hold Warfarin for bridging s/p Arteriography/Fistulogram Wednesday 04/01   Dispo SNF will not take patient until she is proven covid (-). Covid test taken and is pending. Patient is medically stable and ready for discharge to snf when covid testing results.  FEN/GI: Renal w/ fluid restricted Prophylaxis: eliquis, Brilinta, Trental  Disposition: Discharge once stable s/p Arteriography/Fistulogram on 04/01  FEN/GI: Renal diet w/fluid restriction PPx: Heparin gtt  Disposition: discharge  Subjective:  Doing well this am. No complaints. Ready for discharge when covid testing results.  Objective: Temp:  [97.4 F (36.3 C)-98.9 F (37.2 C)] 97.6 F (36.4 C) (04/02 1114) Pulse Rate:  [66-97] 83 (04/02 1114) Resp:  [4-81] 14 (04/02 1114) BP: (102-182)/(45-72) 115/56 (04/02 1114) SpO2:  [0 %-100 %] 97 % (04/02 1114) Weight:  [80.1 kg-84.7 kg] 80.1 kg (04/02 1052) Physical Exam: Gen: 63 year old female receiving dialysis during exam, no distress, resting comfortably CV: R IJ HD cath in place. Rrr, no m/r/g. Weak thrill LUE at AV fistula site Resp: CTAB, no wheezing,  rales, or rhonchi, comfortable work of breathing MSK: wound vac in place over right great toe, good suction. RLE wrapped with ace bandage. Skin: warm, dry, intact, no rashes Bilateral groin: No hematoma, swelling,  or erythema in bilateral groin access sites.  Laboratory: Recent Labs  Lab 12/27/18 0452 12/27/18 1537 12/28/18 0318  WBC 13.3* 12.4* 13.0*  HGB 11.7* 11.8* 11.3*  HCT 35.2* 37.4 35.2*  PLT 507* 468* 477*   Recent Labs  Lab 12/21/18 1732  12/27/18 0452 12/27/18 1537 12/28/18 0318  NA 135   < > 133* 130* 129*  K 3.7   < > 4.2 4.5 4.5  CL 98   < > 93* 95* 94*  CO2 25   < > 26 23 21*  BUN 7*   < > 24* 28* 37*  CREATININE 1.83*   < > 3.76* 3.96* 4.46*  CALCIUM 8.8*   < > 9.8 9.3 9.2  PROT 8.1  --   --   --   --   BILITOT 0.5  --   --   --   --   ALKPHOS 108  --   --   --   --   ALT 10  --   --   --   --   AST 16  --   --   --   --   GLUCOSE 90   < > 112* 110* 90   < > = values in this interval not displayed.   Hep B surface antigen: negative  Imaging/Diagnostic Tests: 3/26 - DG Right Foot: IMPRESSION: 1. Osteomyelitis of the distal phalanx of the right great toe involving the distal tuft and lateral aspect of the proximal to mid phalanx. 2. There is surrounding soft tissue swelling, without soft tissue air. 3. No fracture, dislocation or other evidence of osteomyelitis.  Kristen Dawn, MD 12/28/2018, 11:34 AM PGY-2, [place] Intern pager: [phone], text pages welcome

## 2018-12-28 NOTE — Progress Notes (Addendum)
[facility] Progress Note   [place] TTS  4h  3K/2.25 bath   81.5kg   RIJ TDC  Hep 3000 w/ 3049midrun - no ESA or Fe  - calctiriol 0.75 Recent labs: hgb 11.4  ipTH 313  Assessment/Plan: 1.  R great toe osteomyelitis s/p 1st ray amp 3/27 with VAC -  Dr. Sharol Given -. On Vanc x 2 weeks - HD unit to complete course after d/c - s/p intervention by VVS yesterday with stent right superficial fem-pop 2.  ESRD -  TTS -K 4.2 - next HD Thursday 3.  Hypertension/volume  - titrate volume with HD - prone to large IDWG net UF 2.6 Tuesday post wt 81.4 4.  Anemia  - hgb 11.3 after HD Tuesday - no ESA 5.  Metabolic bone disease -  continue VDRA/phoslo- Ca creeping up - watch - may need non Cabased binder - HD unit to f/u after d/c 6.  Nutrition - renal carb mod diet/vits 7.  Lack of permanent dialysis access -fgram yesterday -widely patent with one bracn near arm crease- planned fistula elevation procedure in the future - will need to have DAPT stopped 48 hours in advance of future ortho or vascular procedures  8. Hx of bed bugs - she indicates this has cleared but HD unit continues to abide by these precautions 9. Hx CVA/CAD 10. Hx DVT  11. Disp - planning rehab facility admission   Myriam Jacobson, PA-C [place] [phone] 12/28/2018,9:04 AM  LOS: 7 days   Pt seen, examined and agree w A/P as above.  [facility] 12/28/2018, 1:50 PM    Subjective:   No c/o today on HD  Objective Vitals:   12/28/18 0700 12/28/18 0730 12/28/18 0800 12/28/18 0830  BP: (!) 138/55 (!) 142/55 (!) 132/54 (!) 119/57  Pulse: 71 71 74 74  Resp:      Temp:      TempSrc:      SpO2:      Weight:      Height:       Physical Exam General: NAD  Heart: RRR  Lungs: no rales Abdomen: + BS soft NT  Extremities:right foot ray amp with wound VAC  Dialysis Access: TDC right IJ and poorly maturing AVF    Additional Objective Labs: Basic Metabolic  Panel: Recent Labs  Lab 12/27/18 0452 12/27/18 1537 12/28/18 0318  NA 133* 130* 129*  K 4.2 4.5 4.5  CL 93* 95* 94*  CO2 26 23 21*  GLUCOSE 112* 110* 90  BUN 24* 28* 37*  CREATININE 3.76* 3.96* 4.46*  CALCIUM 9.8 9.3 9.2  PHOS 4.0 5.1* 5.4*   Liver Function Tests: Recent Labs  Lab 12/21/18 1732  12/27/18 0452 12/27/18 1537 12/28/18 0318  AST 16  --   --   --   --   ALT 10  --   --   --   --   ALKPHOS 108  --   --   --   --   BILITOT 0.5  --   --   --   --   PROT 8.1  --   --   --   --   ALBUMIN 3.4*   < > 3.1* 3.1* 3.0*   < > = values in this interval not displayed.   No results for input(s): LIPASE, AMYLASE in the last 168 hours. CBC: Recent Labs  Lab 12/21/18 1732  12/25/18 0548 12/26/18 0727 12/27/18 0452 12/27/18 1537  12/28/18 0318  WBC 12.0*   < > 11.4* 10.9* 13.3* 12.4* 13.0*  NEUTROABS 8.6*  --   --   --   --   --   --   HGB 12.4   < > 11.1* 10.6* 11.7* 11.8* 11.3*  HCT 38.6   < > 34.8* 33.3* 35.2* 37.4 35.2*  MCV 101.6*   < > 102.1* 101.2* 102.0* 102.7* 101.1*  PLT 602*   < > 488* 531* 507* 468* 477*   < > = values in this interval not displayed.   Blood Culture    Component Value Date/Time   SDES BLOOD LEFT HAND 10/19/2017 1500   SPECREQUEST  10/19/2017 1500    BOTTLES DRAWN AEROBIC AND ANAEROBIC Blood Culture adequate volume   CULT NO GROWTH 5 DAYS 10/19/2017 1500   REPTSTATUS 10/24/2017 FINAL 10/19/2017 1500    Cardiac Enzymes: No results for input(s): CKTOTAL, CKMB, CKMBINDEX, TROPONINI in the last 168 hours. CBG: Recent Labs  Lab 12/27/18 0638 12/27/18 1106 12/27/18 1615 12/27/18 2154 12/28/18 0616  GLUCAP 108* 93 105* 72 101*   Iron Studies: No results for input(s): IRON, TIBC, TRANSFERRIN, FERRITIN in the last 72 hours. Lab Results  Component Value Date   INR 1.2 12/27/2018   INR 1.7 (H) 12/22/2018   INR 1.6 (H) 12/21/2018   Studies/Results: No results found. Medications: . sodium chloride    . sodium chloride    .  sodium chloride    . sodium chloride    . vancomycin Stopped (12/26/18 1100)   . acetaminophen  650 mg Oral Q6H   Or  . acetaminophen  650 mg Rectal Q6H  . amLODipine  5 mg Oral QPM  . apixaban  2.5 mg Oral BID  . aspirin EC  81 mg Oral Daily  . atorvastatin  20 mg Oral q1800  . calcitRIOL  0.75 mcg Oral Q T,Th,Sa-HD  . calcium acetate  667 mg Oral BID WC  . carvedilol  25 mg Oral BID  . Chlorhexidine Gluconate Cloth  6 each Topical Q0600  . gabapentin  100 mg Oral QHS  . hydrALAZINE  25 mg Oral BID  . insulin aspart  0-9 Units Subcutaneous TID WC  . multivitamin  1 tablet Oral QHS  . pentoxifylline  400 mg Oral Q supper  . polyethylene glycol  17 g Oral Daily  . ramelteon  8 mg Oral QHS  . senna-docusate  1 tablet Oral QHS  . sodium chloride flush  3 mL Intravenous Q12H  . ticagrelor  90 mg Oral BID

## 2018-12-28 NOTE — TOC Progression Note (Signed)
Transition of Care [facility]) - Progression Note    Patient Details  Name: Kristen Berry MRN: [id number] Date of Birth: [birth year]  Transition of Care [facility]) CM/SW [place], [place] Phone Number: 12/28/2018, 3:17 PM  Clinical Narrative:    Patient's Covid-19 results pending- must receive negative test results before discharged to SNF.   Once results are received patient will be ready to discharge to [place] [employer] authorization has been received [id number] and HD Coordinator, [name], has confirmed [name]'s OP HD is set up.   Expected Discharge Plan: Skilled Nursing Facility Barriers to Discharge: Continued Medical Work up  Expected Discharge Plan and Services Expected Discharge Plan: [place] In-house Referral: Clinical Social Work Discharge Planning Services: Other - See comment(Nurse case manager initally talked with patient and patient declined [place] services. PT worked with patient again today and SNF recommended) Post Acute Care Choice: [place] arrangements for the past 2 months: Mobile Home                 DME Arranged: N/A DME Agency: NA HH Arranged: Refused SNF(Patient initially refused HH) [place] Agency: NA   Social Determinants of Health (SDOH) Interventions    Readmission Risk Interventions No flowsheet data found.

## 2018-12-28 NOTE — Progress Notes (Addendum)
[facility] [place]  Subjective  - Doing OK, no new complaints.  Currently on HD via right TDC.   Objective (!) 125/51 76 98 F (36.7 C) (Oral) 16 96%  Intake/Output Summary (Last 24 hours) at 12/28/2018 9476 Last data filed at 12/27/2018 2000 Gross per 24 hour  Intake 280 ml  Output 0 ml  Net 280 ml    Left groin stick site without hematoma Left UE Fistula with pulsatile thrill, not easily palpated Right GT wound vac in place Motor intact B LE  Assessment/Planning: S/P angiogram and fistulogram left BCF   Impression:             #1  Left BCF is widely patent. She will need elevation of the fistula and branch ligation.             #2  Occluded right superficial femoral artery successfully crossed and treated using overlapping 6 mm Elluvia stents.             #3  Single-vessel runoff via the anterior tibial artery on the right             #4  Significant in-stent stenosis on the left. F/U with Dr Trula Slade in 3-4 weeks for B LE duplex and ABI's  Dr. Sharol Given is following the right GT amputation and wound vac.  Roxy Horseman 12/28/2018 7:07 AM --  Laboratory Lab Results: Recent Labs    12/27/18 1537 12/28/18 0318  WBC 12.4* 13.0*  HGB 11.8* 11.3*  HCT 37.4 35.2*  PLT 468* 477*   BMET Recent Labs    12/27/18 1537 12/28/18 0318  NA 130* 129*  K 4.5 4.5  CL 95* 94*  CO2 23 21*  GLUCOSE 110* 90  BUN 28* 37*  CREATININE 3.96* 4.46*  CALCIUM 9.3 9.2    COAG Lab Results  Component Value Date   INR 1.2 12/27/2018   INR 1.7 (H) 12/22/2018   INR 1.6 (H) 12/21/2018   No results found for: PTT  I agree with the above.  Stable post angio.  Will need fistula elevation and angio of left leg at a later date.  Wound care of right toe per Dr. Sharol Given.  Discussed ASA/Brilinta/Pradaxa with primary team.  Can stop ASA or Brilinta in 3 months.  Please call with any questions.  Annamarie Major

## 2018-12-28 NOTE — Progress Notes (Signed)
Physical Therapy Treatment Patient Details Name: Kristen Berry MRN: [id number] DOB: Oct 27, 1955 Today's Date: 12/28/2018    History of Present Illness Pt is a 63 y/o female s/p Right foot 1st ray amputation. PMH includes ESRD on HD, provoked bilateral DVT after surgery, type 2 diabetes, MGUS, and  HFpEF     PT Comments    Pt reported she was very tired after having dialysis today but was eager to get into the chair. Was able to move from supine to sit independently with HOB elevated and use of upper extremities. Frequent cuing throughout session to remind pt not to place her Right foot on the floor.  2 PTs present for safety and past need for assistance but pt was minA today with scooting to the Left into the chair. Pt reports moving makes her foot hurt but does not think she has had pain meds since around 8:30 this morning. Was left in chair and encouraged her to contact with any questions.   Follow Up Recommendations  SNF     Equipment Recommendations  Rolling walker with 5" wheels;Wheelchair (measurements PT);Wheelchair cushion (measurements PT)    Recommendations for Other Services       Precautions / Restrictions Precautions Precautions: Fall Restrictions Weight Bearing Restrictions: Yes RLE Weight Bearing: Non weight bearing Other Position/Activity Restrictions: Rt foot    Mobility  Bed Mobility Overal bed mobility: Independent             General bed mobility comments: from supine to sit  Transfers Overall transfer level: Needs assistance Equipment used: 2 person hand held assist Transfers: Lateral/Scoot Transfers          Lateral/Scoot Transfers: +2 physical assistance;Min assist General transfer comment: Lt lateral scoot from bed to chair, 2 scoots with bil UE use  Ambulation/Gait                 Stairs             Wheelchair Mobility    Modified Rankin (Stroke Patients Only)       Balance Overall balance assessment: Needs  assistance Sitting-balance support: Bilateral upper extremity supported Sitting balance-Leahy Scale: Fair                                      Cognition Arousal/Alertness: Awake/alert Behavior During Therapy: WFL for tasks assessed/performed Overall Cognitive Status: Within Functional Limits for tasks assessed                                        Exercises      General Comments        Pertinent Vitals/Pain Pain Assessment: 0-10 Pain Score: 6  Pain Location: Rt foot Pain Descriptors / Indicators: Aching Pain Intervention(s): Limited activity within patient's tolerance;Repositioned;Monitored during session    Home Living Family/patient expects to be discharged to:: Private residence Living Arrangements: Children Available Help at Discharge: Family;Available 24 hours/day Type of Home: Mobile home Home Access: Ramped entrance   Home Layout: One level Home Equipment: [place] - 4 wheels;Bedside commode;Tub bench Additional Comments: Also has a lift chair that she would rather not have to [place] the lift function; she sleeps in her lift chair    Prior Function Level of Independence: Independent with assistive device(s)      Comments: Rollator for ambulation.  Goes to HD 3 x week. Uses RTAS to go to HD   PT Goals (current goals can now be found in the care plan section) Acute Rehab PT Goals Patient Stated Goal: to get home, and she hopes to not need wc for very long PT Goal Formulation: With patient Time For Goal Achievement: 01/07/19 Potential to Achieve Goals: Good    Frequency    Min 2X/week      PT Plan      Co-evaluation              AM-PAC PT "6 Clicks" Mobility   Outcome Measure  Help needed turning from your back to your side while in a flat bed without using bedrails?: None Help needed moving from lying on your back to sitting on the side of a flat bed without using bedrails?: A Little Help needed moving to and from  a bed to a chair (including a wheelchair)?: A Lot Help needed standing up from a chair using your arms (e.g., wheelchair or bedside chair)?: A Lot Help needed to walk in hospital room?: A Lot Help needed climbing 3-5 steps with a railing? : Total 6 Click Score: 14    End of Session Equipment Utilized During Treatment: Gait belt Activity Tolerance: Patient limited by pain;Patient limited by fatigue Patient left: in chair;with chair alarm set;with call bell/phone within reach   PT Visit Diagnosis: Unsteadiness on feet (R26.81);Other abnormalities of gait and mobility (R26.89);Difficulty in walking, not elsewhere classified (R26.2)     Time: 1445-1500 PT Time Calculation (min) (ACUTE ONLY): 15 min  Charges:  $Therapeutic Activity: 8-22 mins                     Selinda Eon PT, DPT Acute Rehab [phone]

## 2018-12-29 LAB — CBC
HCT: 34.8 % — ABNORMAL LOW (ref 36.0–46.0)
Hemoglobin: 11 g/dL — ABNORMAL LOW (ref 12.0–15.0)
MCH: 32.1 pg (ref 26.0–34.0)
MCHC: 31.6 g/dL (ref 30.0–36.0)
MCV: 101.5 fL — ABNORMAL HIGH (ref 80.0–100.0)
Platelets: 434 10*3/uL — ABNORMAL HIGH (ref 150–400)
RBC: 3.43 MIL/uL — ABNORMAL LOW (ref 3.87–5.11)
RDW: 15 % (ref 11.5–15.5)
WBC: 14.4 10*3/uL — ABNORMAL HIGH (ref 4.0–10.5)
nRBC: 0 % (ref 0.0–0.2)

## 2018-12-29 LAB — RENAL FUNCTION PANEL
Albumin: 3 g/dL — ABNORMAL LOW (ref 3.5–5.0)
Anion gap: 14 (ref 5–15)
BUN: 18 mg/dL (ref 8–23)
CO2: 25 mmol/L (ref 22–32)
Calcium: 8.9 mg/dL (ref 8.9–10.3)
Chloride: 92 mmol/L — ABNORMAL LOW (ref 98–111)
Creatinine, Ser: 3.24 mg/dL — ABNORMAL HIGH (ref 0.44–1.00)
GFR calc Af Amer: 17 mL/min — ABNORMAL LOW (ref >60)
GFR calc non Af Amer: 14 mL/min — ABNORMAL LOW (ref >60)
Glucose, Bld: 88 mg/dL (ref 70–99)
Phosphorus: 3.7 mg/dL (ref 2.5–4.6)
Potassium: 3.6 mmol/L (ref 3.5–5.1)
Sodium: 131 mmol/L — ABNORMAL LOW (ref 135–145)

## 2018-12-29 LAB — VANCOMYCIN, RANDOM: Vancomycin Rm: 22

## 2018-12-29 LAB — GLUCOSE, CAPILLARY
Glucose-Capillary: 98 mg/dL (ref 70–99)
Glucose-Capillary: 104 mg/dL — ABNORMAL HIGH (ref 70–99)
Glucose-Capillary: 126 mg/dL — ABNORMAL HIGH (ref 70–99)
Glucose-Capillary: 140 mg/dL — ABNORMAL HIGH (ref 70–99)

## 2018-12-29 MED ORDER — CHLORHEXIDINE GLUCONATE CLOTH 2 % EX PADS
6.0000 | MEDICATED_PAD | Freq: Every day | CUTANEOUS | Status: DC
  Administered 2018-12-31: 6 via TOPICAL

## 2018-12-29 MED ORDER — VANCOMYCIN HCL IN DEXTROSE 750-5 MG/150ML-% IV SOLN
750.0000 mg | INTRAVENOUS | Status: DC
  Administered 2018-12-30: 11:00:00 750 mg via INTRAVENOUS
  Filled 2018-12-29: qty 150

## 2018-12-29 NOTE — Progress Notes (Signed)
[facility] Progress Note   [place] TTS  4h  3K/2.25 bath   81.5kg   RIJ TDC  Hep 3000 w/ 3048midrun - no ESA or Fe  - calctiriol 0.75 Recent labs: hgb 11.4  ipTH 313  Assessment/Plan: 1.  R great toe osteomyelitis s/p 1st ray amp 3/27 with VAC -  Dr. Sharol Given -. On Vanc x 2 weeks. To get vanc IV at OP HD unit to complete course after d/c.  s/p percut revasc of occluded R SFA w/ overlapping stents on 4/1 by VVS 2.  ESRD -  TTS -K 4.2 - next HD Sat 3.  Hypertension/volume  - titrate volume with HD - prone to large IDWG.  Under dry wt slightly 4.  Anemia  ckd - hgb 11's,  no ESA 5.  Metabolic bone disease -  continue VDRA/phoslo- Ca creeping up - watch - may need non Cabased binder - HD unit to f/u after d/c 6.  Nutrition - renal carb mod diet/vits 7.  Lack of permanent dialysis access -had fistulogram on 4/1 Dr Trula Slade planning fistula elevation and branch ligation procedure in the future - will need to have DAPT stopped 48 hours in advance of future ortho or vascular procedures  8. Hx of bed bugs - she indicates this has cleared but HD unit continues to abide by these precautions 9. Hx CVA/CAD 10. Hx DVT  11. Disp - planning rehab facility admission     [facility] 12/29/2018, 12:34 PM    Subjective:   Seen in room, no c/o  Objective Vitals:   12/29/18 0520 12/29/18 0758 12/29/18 0800 12/29/18 0859  BP: (!) 115/50  (!) 105/41 (!) 105/41  Pulse: 99  72   Resp: 19  17   Temp: 98.3 F (36.8 C) 97.7 F (36.5 C)    TempSrc: Oral Oral    SpO2: 99%  97%   Weight:      Height:       Physical Exam General: NAD  Heart: RRR  Lungs: no rales Abdomen: + BS soft NT  Extremities:right foot ray amp with wound VAC  Dialysis Access: TDC right IJ and poorly maturing AVF    Additional Objective Labs: Basic Metabolic Panel: Recent Labs  Lab 12/27/18 1537 12/28/18 0318 12/29/18 0226  NA 130* 129* 131*  K 4.5 4.5 3.6  CL 95* 94* 92*  CO2 23  21* 25  GLUCOSE 110* 90 88  BUN 28* 37* 18  CREATININE 3.96* 4.46* 3.24*  CALCIUM 9.3 9.2 8.9  PHOS 5.1* 5.4* 3.7   Liver Function Tests: Recent Labs  Lab 12/27/18 1537 12/28/18 0318 12/29/18 0226  ALBUMIN 3.1* 3.0* 3.0*   No results for input(s): LIPASE, AMYLASE in the last 168 hours. CBC: Recent Labs  Lab 12/26/18 0727 12/27/18 0452 12/27/18 1537 12/28/18 0318 12/29/18 0226  WBC 10.9* 13.3* 12.4* 13.0* 14.4*  HGB 10.6* 11.7* 11.8* 11.3* 11.0*  HCT 33.3* 35.2* 37.4 35.2* 34.8*  MCV 101.2* 102.0* 102.7* 101.1* 101.5*  PLT 531* 507* 468* 477* 434*   Blood Culture    Component Value Date/Time   SDES BLOOD LEFT HAND 10/19/2017 1500   SPECREQUEST  10/19/2017 1500    BOTTLES DRAWN AEROBIC AND ANAEROBIC Blood Culture adequate volume   CULT NO GROWTH 5 DAYS 10/19/2017 1500   REPTSTATUS 10/24/2017 FINAL 10/19/2017 1500    Cardiac Enzymes: No results for input(s): CKTOTAL, CKMB, CKMBINDEX, TROPONINI in the last 168 hours. CBG: Recent Labs  Lab 12/28/18  1138 12/28/18 1656 12/28/18 2129 12/29/18 0607 12/29/18 1119  GLUCAP 96 119* 109* 98 104*   Iron Studies: No results for input(s): IRON, TIBC, TRANSFERRIN, FERRITIN in the last 72 hours. Lab Results  Component Value Date   INR 1.2 12/27/2018   INR 1.7 (H) 12/22/2018   INR 1.6 (H) 12/21/2018   Studies/Results: No results found. Medications: . sodium chloride    . sodium chloride    . sodium chloride    . sodium chloride    . [START ON 12/30/2018] vancomycin     . acetaminophen  650 mg Oral Q6H   Or  . acetaminophen  650 mg Rectal Q6H  . amLODipine  5 mg Oral QPM  . apixaban  2.5 mg Oral BID  . aspirin EC  81 mg Oral Daily  . atorvastatin  20 mg Oral q1800  . calcitRIOL  0.75 mcg Oral Q T,Th,Sa-HD  . calcium acetate  667 mg Oral BID WC  . carvedilol  25 mg Oral BID  . Chlorhexidine Gluconate Cloth  6 each Topical Q0600  . gabapentin  100 mg Oral QHS  . hydrALAZINE  25 mg Oral BID  . insulin aspart   0-9 Units Subcutaneous TID WC  . multivitamin  1 tablet Oral QHS  . pentoxifylline  400 mg Oral Q supper  . polyethylene glycol  17 g Oral Daily  . ramelteon  8 mg Oral QHS  . senna-docusate  1 tablet Oral QHS  . sodium chloride flush  3 mL Intravenous Q12H  . ticagrelor  90 mg Oral BID

## 2018-12-29 NOTE — Progress Notes (Signed)
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 PMH includes ESRD on HD, provoked bilateral DVT after surgery, type 2 diabetes, MGUS, and  HFpEF     PT Comments    Pt continues to make steady progress. Continue to recommend ST-SNF.   Follow Up Recommendations  SNF     Equipment Recommendations  Rolling walker with 5" wheels;Wheelchair (measurements PT);Wheelchair cushion (measurements PT)    Recommendations for Other Services       Precautions / Restrictions Precautions Precautions: Fall Restrictions Weight Bearing Restrictions: Yes RLE Weight Bearing: Non weight bearing Other Position/Activity Restrictions: Rt foot    Mobility  Bed Mobility Overal bed mobility: Modified Independent;Needs Assistance Bed Mobility: Supine to Sit     Supine to sit: Modified independent (Device/Increase time);HOB elevated     General bed mobility comments: incr time and effort  Transfers Overall transfer level: Needs assistance Equipment used: Rolling walker (2 wheeled) Transfers: Lateral/Scoot Transfers;Sit to/from Stand Sit to Stand: +2 safety/equipment;Mod assist        Lateral/Scoot Transfers: +2 physical assistance;Min assist General transfer comment: Pt performed bed to recliner with scoot transfer with +2 min assist. Verbal/tactile cues for weight bearing. Pt performed sit to stand from chair with +2 mod assist to bring hips up and for balance. Assist to attempt to keep pt NWB.  Ambulation/Gait                 Stairs             Wheelchair Mobility    Modified Rankin (Stroke Patients Only)       Balance Overall balance assessment: Needs assistance Sitting-balance support: No upper extremity supported Sitting balance-Leahy Scale: Good     Standing balance support: Bilateral upper extremity  supported Standing balance-Leahy Scale: Poor Standing balance comment: walker and +2 min assist for static standing x 45 sec and unable to maintain NWB                            Cognition Arousal/Alertness: Awake/alert Behavior During Therapy: WFL for tasks assessed/performed Overall Cognitive Status: Within Functional Limits for tasks assessed Area of Impairment: Memory                     Memory: Decreased short-term memory                Exercises      General Comments        Pertinent Vitals/Pain Pain Assessment: Faces Faces Pain Scale: Hurts even more Pain Location: Rt foot Pain Descriptors / Indicators: Aching;Throbbing Pain Intervention(s): Limited activity within patient's tolerance;Monitored during session;Repositioned    Home Living                      Prior Function            PT Goals (current goals can now be found in the care plan section) Progress towards PT goals: Progressing toward goals    Frequency    Min 2X/week      PT Plan Current plan remains appropriate    Co-evaluation              AM-PAC PT "6 Clicks" Mobility   Outcome Measure  Help needed turning from your back to your side  while in a flat bed without using bedrails?: None Help needed moving from lying on your back to sitting on the side of a flat bed without using bedrails?: None Help needed moving to and from a bed to a chair (including a wheelchair)?: A Lot Help needed standing up from a chair using your arms (e.g., wheelchair or bedside chair)?: A Lot Help needed to walk in hospital room?: Total Help needed climbing 3-5 steps with a railing? : Total 6 Click Score: 14    End of Session Equipment Utilized During Treatment: Gait belt Activity Tolerance: Patient limited by pain;Patient limited by fatigue Patient left: in chair;with chair alarm set;with call bell/phone within reach   PT Visit Diagnosis: Unsteadiness on feet  (R26.81);Other abnormalities of gait and mobility (R26.89);Difficulty in walking, not elsewhere classified (R26.2)     Time: 5701-7793 PT Time Calculation (min) (ACUTE ONLY): 15 min  Charges:  $Therapeutic Activity: 8-22 mins                     [place] Pager [phone] Office [place] 12/29/2018, 1:55 PM

## 2018-12-29 NOTE — Progress Notes (Signed)
CSW followed up with Terri Piedra, with out-patient dialysis. She has arranged for the patient to begin dialysis in [place] whenever she is transported to her SNF. Pt will have dialysis on T, TH, S.   We are awaiting the COVID-19 screening to come back. Once she tests negative, she should be able to DC to her SNF.   CSW will continue to follow and assist with discharge.   Domenic Schwab, MSW, [place]

## 2018-12-29 NOTE — Progress Notes (Signed)
Pharmacy Antibiotic Note  Kristen Berry is a 63 y.o. female admitted on 12/21/2018.  Pharmacy has been consulted for Vanco/Fortaz dosing. Necrotic R big toe with redness and drainage. 1rst ray amputation on 3/27 of right great toe w/ osteomyelitis. Kristen Berry stopped 24 hours after surgery. WBC 14.4, last HD session yesterday, 4/2- vancomycin given afterwards. Given patient's VR = 22 this morning, and body weight ~80kg, will slightly decrease vancomycin dosing.  Vanco 3/19>> (4/8) Kristen Berry 3/19>>3/28  Plan: Decrease Vancomycin to 750mg  IV qHD TTS for R leg cellulitis  Monitor vanc levels as needed and patient clinical status   Height: 5\' 8"  (172.7 cm) Weight: 175 lb 11.3 oz (79.7 kg) IBW/kg (Calculated) : 63.9  Temp (24hrs), Avg:98.2 F (36.8 C), Min:97.6 F (36.4 C), Max:98.9 F (37.2 C)  Recent Labs  Lab 12/26/18 0727 12/27/18 0452 12/27/18 1537 12/28/18 0318 12/29/18 0226  WBC 10.9* 13.3* 12.4* 13.0* 14.4*  CREATININE 5.20* 3.76* 3.96* 4.46* 3.24*  VANCORANDOM  --   --   --   --  22    Estimated Creatinine Clearance: 19.7 mL/min (A) (by C-G formula based on SCr of 3.24 mg/dL (H)).    Allergies  Allergen Reactions  . Crestor [Rosuvastatin Calcium] Other (See Comments)    Leg pain    Janae Bridgeman, PharmD PGY1 Pharmacy Resident Phone: [phone] 12/29/2018 7:16 AM

## 2018-12-29 NOTE — Progress Notes (Signed)
Family Medicine Teaching Service Daily Progress Note Intern Pager: [phone]  Patient name: TALECIA SHERLIN Medical record number: [id number] Date of birth: 1956-05-22 Age: 63 y.o. Gender: female  Primary Care Provider: [place], Sarah T, MD Consultants: orthopedics, nephrology, PT/OT Code Status: DNR  Pt Overview and Major Events to Date:  3/27: Amp R-great to /dt osteomyelitis 4/01: Fistulogram made by VVS 4/02: COVID test obtained per SNF request, medical stable for d/c  Assessment and Plan: [facility] a 63 y.o.femalepresenting with osteomyelitis of the right great toe.[name] PMH is significant forosteomyelitis of the left toes s/p amputation, ESRD, diabetes type 2, hypertension, history of DVT, history of stroke, CHF, MGUS.   1.  Osteomyelitis of Right Great toe S/P ray amp on 3/27. Wound vac in place with good seal. Follow up with Dr. Sharol Given in one week.  Poor blood flow with stenting of RLE by vascular. Start eliquis, continue brilinta and aspirin.. - continue vanc, pharmacy to dose, with dialysis until 4/8 - tylenol as needed - oxycodone 5mg  q 6 hours - wound vac and wound management per ortho and wound care  2.  Right leg cellulitis Afebrile. Continue vanc with dialysis until 4/8 as above. Continue leg wrapping.  3.  ESRD on HDTThS, chronic, stable: Dialysis TTS. HD 4/2. Dialyzes through R IJ HD cath. Will undergo fistulagram 4/1 to determine viability. Per patient report this was placed around 1 year ago. Electrolyte management per nephro. - Dialysis per nephro - Renally dose medications  - Continue PhosLo  4.  Diabetes mellitus type 2: A1c 5.5% 12/22/2018.Most recent CBGs 112-162 requiring only 4U of aspart in past 24 hours. Diet controlled. -sSSI, CBGs q4 hours  5.  HFrEF:January 2019 patient had echo that showed LVEF 35 to 40%. Septal and apical akinesis, moderately dilated LV, mild LVH. -Continue HD. No other tx at this time.  6.  CAD: left heart cath in  February 2019 showed severe diffuse diabetic coronary disease, 70 to 80% stenosis of proximal LAD 75% obstruction of RCA -Continue eliquis, Brilinta, and Trental  7.  HTN, chronic, stable: BP 115/50. Takes amlodipine 5mg , Coreg 25mg  BID, hydralazine 25mg  BID at home. -Continue home meds  8.  HLD: pt takes 20mg  lipitor at home. Last Lipid panel January 2019 with Trig 180, HDL 39, and LDL 109. -Continue Lipitor  9.  History of left leg DVT:pt takes warfarin 4mg  TTSS/5mg  MWF.Patient has history of DVT in left leg. Unknown when she first started warfarin, has been at least 1 year. Patient currently subtherapeutic with INR of 1.2.  -Continue Brilinta, Trental, heparin    FEN/GI: renal diet w/fluid restriction PPx: heparin, Eliquis, Brilinta, Trental  Disposition: Medically stable for discharge to SNF pending COVID screen per SNF request (asymptomatic and low risk)  Subjective:  No complaints today.  Feels that her generalized body aches have significantly improved.  Denies shortness of breath, chest pain, cough, nausea or vomiting.  Does continue to have chronic lower extremity pain.  Objective: Temp:  [97.6 F (36.4 C)-98.9 F (37.2 C)] 97.7 F (36.5 C) (04/03 0758) Pulse Rate:  [77-106] 99 (04/03 0520) Resp:  [14-19] 19 (04/03 0520) BP: (99-126)/(45-74) 115/50 (04/03 0520) SpO2:  [95 %-99 %] 99 % (04/03 0520) Weight:  [79.7 kg-80.1 kg] 79.7 kg (04/03 0500) Physical Exam: General: well nourished, well developed, NAD with non-toxic appearance HEENT: normocephalic, atraumatic, moist mucous membranes Cardiovascular: regular rate and rhythm without murmurs, rubs, or gallops, fistula site intact with good thrill Lungs: clear to auscultation  bilaterally with normal work of breathing Abdomen: soft, non-tender, non-distended, normoactive bowel sounds Skin: warm, dry, no rashes or lesions, cap refill < 2 seconds Extremities: warm and well perfused, normal tone, no edema, wound vac in  place with good output and dry dressings  Laboratory: Recent Labs  Lab 12/27/18 1537 12/28/18 0318 12/29/18 0226  WBC 12.4* 13.0* 14.4*  HGB 11.8* 11.3* 11.0*  HCT 37.4 35.2* 34.8*  PLT 468* 477* 434*   Recent Labs  Lab 12/27/18 1537 12/28/18 0318 12/29/18 0226  NA 130* 129* 131*  K 4.5 4.5 3.6  CL 95* 94* 92*  CO2 23 21* 25  BUN 28* 37* 18  CREATININE 3.96* 4.46* 3.24*  CALCIUM 9.3 9.2 8.9  GLUCOSE 110* 90 88   Hep B surface antigen: negative  Imaging/Diagnostic Tests: 3/26 - DG Right Foot: IMPRESSION: 1. Osteomyelitis of the distal phalanx of the right great toe involving the distal tuft and lateral aspect of the proximal to mid phalanx. 2. There is surrounding soft tissue swelling, without soft tissue air. 3. No fracture, dislocation or other evidence of osteomyelitis.    Sloan Bing, DO 12/29/2018, 9:36 AM PGY-3, [place] Intern pager: [phone], text pages welcome

## 2018-12-29 NOTE — Progress Notes (Signed)
Physical Therapy Treatment Patient Details Name: Kristen Berry MRN: [id number] DOB: [birth year] Today's Date: 12/29/2018    History of Present Illness Pt is a 63 y/o female s/p Right foot 1st ray amputation. PMH includes ESRD on HD, provoked bilateral DVT after surgery, type 2 diabetes, MGUS, and  HFpEF     PT Comments    RN staff asked PT for help mobilizing Ms. Kitts to the bed/BSC.  The pt was successful in transferring back to bed and then onto the [facility] using the A/P technique as she was too weak to stand and pivot (or even stand) and transfer to the [facility].  PT will continue to follow acutely for safe mobility progression   Follow Up Recommendations  SNF     Equipment Recommendations  Rolling walker with 5" wheels;Wheelchair (measurements PT);Wheelchair cushion (measurements PT)    Recommendations for Other Services   NA     Precautions / Restrictions Precautions Precautions: Fall Precaution Comments: wound vac R foot Restrictions Weight Bearing Restrictions: Yes RLE Weight Bearing: Non weight bearing Other Position/Activity Restrictions: Rt foot    Mobility  Bed Mobility Overal bed mobility: Needs Assistance Bed Mobility: Sit to Supine     Supine to sit: Modified independent (Device/Increase time);HOB elevated Sit to supine: Min assist   General bed mobility comments: Min assist to help support trunk during transitions and lightly lift legs  Transfers Overall transfer level: Needs assistance Equipment used: Rolling walker (2 wheeled) Transfers: Lateral/Scoot Transfers;Anterior-Posterior Transfer Sit to Stand: +2 safety/equipment;Mod assist     Anterior-Posterior transfers: +2 physical assistance;Mod assist  Lateral/Scoot Transfers: +2 physical assistance;Mod assist General transfer comment: With RN staff assist pt scooted laterally from drop arm chair to bed with two person mod assist cues to keep right foot out to avoid pushing with it and once seated EOB  transferred A/P two person mod assist back onto [facility].  Pt was able to successfully urinate and then transfer back to bed two person mod assist a/p transfer.    Ambulation/Gait             General Gait Details: unable          Balance Overall balance assessment: Needs assistance Sitting-balance support: Feet supported;Bilateral upper extremity supported;No upper extremity supported Sitting balance-Leahy Scale: Good     Standing balance support: Bilateral upper extremity supported Standing balance-Leahy Scale: Poor Standing balance comment: unable to get pt standing after sitting up in chair for a few hours.                            Cognition Arousal/Alertness: Awake/alert Behavior During Therapy: WFL for tasks assessed/performed Overall Cognitive Status: Within Functional Limits for tasks assessed Area of Impairment: Memory                     Memory: Decreased short-term memory         General Comments: Not specifically tested             Pertinent Vitals/Pain Pain Assessment: Faces Faces Pain Scale: Hurts even more Pain Location: Rt foot Pain Descriptors / Indicators: Aching;Throbbing Pain Intervention(s): Limited activity within patient's tolerance;Monitored during session;Repositioned           PT Goals (current goals can now be found in the care plan section) Acute Rehab PT Goals Patient Stated Goal: to get home, and she hopes to not need wc for very long Progress towards PT goals: Progressing  toward goals    Frequency    Min 2X/week      PT Plan Current plan remains appropriate       AM-PAC PT "6 Clicks" Mobility   Outcome Measure  Help needed turning from your back to your side while in a flat bed without using bedrails?: None Help needed moving from lying on your back to sitting on the side of a flat bed without using bedrails?: None Help needed moving to and from a bed to a chair (including a wheelchair)?: A  Lot Help needed standing up from a chair using your arms (e.g., wheelchair or bedside chair)?: Total Help needed to walk in hospital room?: Total Help needed climbing 3-5 steps with a railing? : Total 6 Click Score: 13    End of Session Equipment Utilized During Treatment: Gait belt Activity Tolerance: Patient limited by pain;Patient limited by fatigue Patient left: in bed;with call bell/phone within reach;with nursing/sitter in room   PT Visit Diagnosis: Unsteadiness on feet (R26.81);Other abnormalities of gait and mobility (R26.89);Difficulty in walking, not elsewhere classified (R26.2)     Time: 3887-1959 PT Time Calculation (min) (ACUTE ONLY): 19 min  Charges:  $Therapeutic Activity: 8-22 mins                    Temica Righetti B. Jenniefer Salak, PT, DPT  Acute Rehabilitation [phone] pager #[phone] office   12/29/2018, 4:00 PM

## 2018-12-30 ENCOUNTER — Telehealth: Payer: Self-pay | Admitting: Family Medicine

## 2018-12-30 LAB — RENAL FUNCTION PANEL
Albumin: 2.9 g/dL — ABNORMAL LOW (ref 3.5–5.0)
Anion gap: 11 (ref 5–15)
BUN: 34 mg/dL — ABNORMAL HIGH (ref 8–23)
CO2: 24 mmol/L (ref 22–32)
Calcium: 9.1 mg/dL (ref 8.9–10.3)
Chloride: 95 mmol/L — ABNORMAL LOW (ref 98–111)
Creatinine, Ser: 4.73 mg/dL — ABNORMAL HIGH (ref 0.44–1.00)
GFR calc Af Amer: 11 mL/min — ABNORMAL LOW (ref >60)
GFR calc non Af Amer: 9 mL/min — ABNORMAL LOW (ref >60)
Glucose, Bld: 118 mg/dL — ABNORMAL HIGH (ref 70–99)
Phosphorus: 5.1 mg/dL — ABNORMAL HIGH (ref 2.5–4.6)
Potassium: 3.7 mmol/L (ref 3.5–5.1)
Sodium: 130 mmol/L — ABNORMAL LOW (ref 135–145)

## 2018-12-30 LAB — CBC
HCT: 33 % — ABNORMAL LOW (ref 36.0–46.0)
Hemoglobin: 10.7 g/dL — ABNORMAL LOW (ref 12.0–15.0)
MCH: 32.9 pg (ref 26.0–34.0)
MCHC: 32.4 g/dL (ref 30.0–36.0)
MCV: 101.5 fL — ABNORMAL HIGH (ref 80.0–100.0)
Platelets: 426 10*3/uL — ABNORMAL HIGH (ref 150–400)
RBC: 3.25 MIL/uL — ABNORMAL LOW (ref 3.87–5.11)
RDW: 14.9 % (ref 11.5–15.5)
WBC: 14.3 10*3/uL — ABNORMAL HIGH (ref 4.0–10.5)
nRBC: 0 % (ref 0.0–0.2)

## 2018-12-30 LAB — NOVEL CORONAVIRUS, NAA (HOSP ORDER, SEND-OUT TO REF LAB; TAT 18-24 HRS): SARS-CoV-2, NAA: NOT DETECTED

## 2018-12-30 LAB — GLUCOSE, CAPILLARY
Glucose-Capillary: 114 mg/dL — ABNORMAL HIGH (ref 70–99)
Glucose-Capillary: 128 mg/dL — ABNORMAL HIGH (ref 70–99)
Glucose-Capillary: 162 mg/dL — ABNORMAL HIGH (ref 70–99)
Glucose-Capillary: 119 mg/dL — ABNORMAL HIGH (ref 70–99)

## 2018-12-30 MED ORDER — OXYCODONE HCL 5 MG PO TABS
5.0000 mg | ORAL_TABLET | Freq: Two times a day (BID) | ORAL | Status: DC | PRN
  Administered 2018-12-30 – 2018-12-31 (×2): 5 mg via ORAL
  Filled 2018-12-30 (×2): qty 1

## 2018-12-30 MED ORDER — ASPIRIN 81 MG PO TBEC: 81.0000 mg | DELAYED_RELEASE_TABLET | Freq: Every day | ORAL | 0 refills | Status: DC

## 2018-12-30 MED ORDER — VANCOMYCIN HCL IN DEXTROSE 750-5 MG/150ML-% IV SOLN
INTRAVENOUS | Status: AC
  Administered 2018-12-30: 750 mg via INTRAVENOUS
  Filled 2018-12-30: qty 150

## 2018-12-30 MED ORDER — APIXABAN 2.5 MG PO TABS: 2.5000 mg | ORAL_TABLET | Freq: Two times a day (BID) | ORAL | Status: DC

## 2018-12-30 MED ORDER — PENTAFLUOROPROP-TETRAFLUOROETH EX AERO: 1.0000 "application " | INHALATION_SPRAY | CUTANEOUS | 0 refills | Status: AC | PRN

## 2018-12-30 MED ORDER — CALCITRIOL 0.25 MCG PO CAPS
ORAL_CAPSULE | ORAL | Status: AC
  Filled 2018-12-30: qty 1

## 2018-12-30 MED ORDER — OXYCODONE HCL 5 MG PO TABS: 5.0000 mg | ORAL_TABLET | Freq: Two times a day (BID) | ORAL | 0 refills | Status: DC | PRN

## 2018-12-30 MED ORDER — POLYETHYLENE GLYCOL 3350 17 G PO PACK: 17.0000 g | PACK | Freq: Every day | ORAL | 0 refills | Status: DC | PRN

## 2018-12-30 MED ORDER — RENA-VITE PO TABS: 1.0000 | ORAL_TABLET | Freq: Every day | ORAL | 0 refills | Status: DC

## 2018-12-30 MED ORDER — GABAPENTIN 100 MG PO CAPS: 100.0000 mg | ORAL_CAPSULE | Freq: Every day | ORAL | Status: DC

## 2018-12-30 MED ORDER — HEPARIN SODIUM (PORCINE) 1000 UNIT/ML IJ SOLN
INTRAMUSCULAR | Status: AC
  Administered 2018-12-30: 3000 [IU] via INTRAVENOUS_CENTRAL
  Filled 2018-12-30: qty 3

## 2018-12-30 MED ORDER — HEPARIN SODIUM (PORCINE) 1000 UNIT/ML DIALYSIS: 1000.0000 [IU] | INTRAMUSCULAR | Status: DC | PRN

## 2018-12-30 MED ORDER — HEPARIN SODIUM (PORCINE) 1000 UNIT/ML DIALYSIS: 3000.0000 [IU] | INTRAMUSCULAR | Status: DC | PRN

## 2018-12-30 MED ORDER — PENTOXIFYLLINE ER 400 MG PO TBCR: 400.0000 mg | EXTENDED_RELEASE_TABLET | Freq: Every day | ORAL | Status: DC

## 2018-12-30 MED ORDER — HEPARIN SODIUM (PORCINE) 1000 UNIT/ML DIALYSIS
3000.0000 [IU] | INTRAMUSCULAR | Status: DC | PRN
  Administered 2018-12-30: 08:00:00 3000 [IU] via INTRAVENOUS_CENTRAL
  Filled 2018-12-30: qty 3

## 2018-12-30 MED ORDER — CALCITRIOL 0.5 MCG PO CAPS
ORAL_CAPSULE | ORAL | Status: AC
  Filled 2018-12-30: qty 1

## 2018-12-30 MED ORDER — HEPARIN SODIUM (PORCINE) 1000 UNIT/ML DIALYSIS: 20.0000 [IU]/kg | INTRAMUSCULAR | Status: DC | PRN

## 2018-12-30 MED ORDER — VANCOMYCIN HCL IN DEXTROSE 750-5 MG/150ML-% IV SOLN: 750.0000 mg | INTRAVENOUS | Status: DC

## 2018-12-30 MED ORDER — CALCITRIOL 0.25 MCG PO CAPS: 0.7500 ug | ORAL_CAPSULE | ORAL | Status: DC

## 2018-12-30 MED ORDER — ALTEPLASE 2 MG IJ SOLR: 2.0000 mg | Freq: Once | INTRAMUSCULAR | Status: DC | PRN

## 2018-12-30 NOTE — Progress Notes (Signed)
[facility] Progress Note   [place] TTS  4h  3K/2.25 bath   81.5kg   RIJ TDC  Hep 3000 w/ 3058midrun - no ESA or Fe  - calctiriol 0.75 Recent labs: hgb 11.4  ipTH 313  Assessment/Plan: 1.  R great toe osteomyelitis s/p 1st ray amp 3/27 with VAC -  Dr. Sharol Given -.  after d/c. S/p percut revasc of occluded R SFA w/ overlapping stents on 4/1 by VVS. On Vanc x 2 weeks. To get vanc IV at OP HD unit to complete course 2.  ESRD -  TTS HD.  HD today 3.  Hypertension/volume  - titrate volume with HD - prone to large IDWG.  Under dry wt slightly 4.  Anemia  ckd - hgb 11's,  no ESA 5.  Metabolic bone disease -  continue VDRA/phoslo- Ca creeping up - watch - may need non Ca based binder - HD unit to f/u after d/c 6.  Nutrition - renal carb mod diet/vits 7.  Lack of permanent dialysis access -had fistulogram on 4/1 Dr Trula Slade planning fistula elevation and branch ligation procedure in the future - will need to have DAPT stopped 48 hours in advance of future ortho or vascular procedures  8. Hx of bed bugs - she indicates this has cleared but HD unit continues to abide by these precautions 9. Hx CVA/CAD 10. Hx DVT  11. Disp - planning SNF placement.  COVID test is negative 4/1 resulted.     [facility] 12/30/2018, 3:18 PM    Subjective:   Seen in room, no c/o  Objective Vitals:   12/30/18 1100 12/30/18 1130 12/30/18 1150 12/30/18 1238  BP: (!) 127/53 (!) 109/37 (!) 125/29 (!) 131/91  Pulse: 86 82 90 85  Resp:   (!) 25 18  Temp:   (!) 97.4 F (36.3 C) (!) 97.5 F (36.4 C)  TempSrc:   Oral Oral  SpO2:   100% 100%  Weight:   81.4 kg   Height:       Physical Exam General: NAD  Heart: RRR  Lungs: no rales Abdomen: + BS soft NT  Extremities:right foot ray amp with wound VAC  Dialysis Access: TDC right IJ and poorly maturing AVF    Additional Objective Labs: Basic Metabolic Panel: Recent Labs  Lab 12/28/18 0318 12/29/18 0226 12/30/18 0235  NA  129* 131* 130*  K 4.5 3.6 3.7  CL 94* 92* 95*  CO2 21* 25 24  GLUCOSE 90 88 118*  BUN 37* 18 34*  CREATININE 4.46* 3.24* 4.73*  CALCIUM 9.2 8.9 9.1  PHOS 5.4* 3.7 5.1*   Liver Function Tests: Recent Labs  Lab 12/28/18 0318 12/29/18 0226 12/30/18 0235  ALBUMIN 3.0* 3.0* 2.9*   No results for input(s): LIPASE, AMYLASE in the last 168 hours. CBC: Recent Labs  Lab 12/27/18 0452 12/27/18 1537 12/28/18 0318 12/29/18 0226 12/30/18 0235  WBC 13.3* 12.4* 13.0* 14.4* 14.3*  HGB 11.7* 11.8* 11.3* 11.0* 10.7*  HCT 35.2* 37.4 35.2* 34.8* 33.0*  MCV 102.0* 102.7* 101.1* 101.5* 101.5*  PLT 507* 468* 477* 434* 426*   Blood Culture    Component Value Date/Time   SDES BLOOD LEFT HAND 10/19/2017 1500   SPECREQUEST  10/19/2017 1500    BOTTLES DRAWN AEROBIC AND ANAEROBIC Blood Culture adequate volume   CULT NO GROWTH 5 DAYS 10/19/2017 1500   REPTSTATUS 10/24/2017 FINAL 10/19/2017 1500    Cardiac Enzymes: No results for input(s): CKTOTAL, CKMB, CKMBINDEX, TROPONINI in  the last 168 hours. CBG: Recent Labs  Lab 12/29/18 1119 12/29/18 1737 12/29/18 2054 12/30/18 0557 12/30/18 1259  GLUCAP 104* 126* 140* 114* 128*   Iron Studies: No results for input(s): IRON, TIBC, TRANSFERRIN, FERRITIN in the last 72 hours. Lab Results  Component Value Date   INR 1.2 12/27/2018   INR 1.7 (H) 12/22/2018   INR 1.6 (H) 12/21/2018   Studies/Results: No results found. Medications: . sodium chloride    . sodium chloride    . vancomycin 750 mg (12/30/18 1039)   . acetaminophen  650 mg Oral Q6H   Or  . acetaminophen  650 mg Rectal Q6H  . amLODipine  5 mg Oral QPM  . apixaban  2.5 mg Oral BID  . aspirin EC  81 mg Oral Daily  . atorvastatin  20 mg Oral q1800  . calcitRIOL  0.75 mcg Oral Q T,Th,Sa-HD  . calcium acetate  667 mg Oral BID WC  . carvedilol  25 mg Oral BID  . Chlorhexidine Gluconate Cloth  6 each Topical Q0600  . Chlorhexidine Gluconate Cloth  6 each Topical Q0600  .  gabapentin  100 mg Oral QHS  . hydrALAZINE  25 mg Oral BID  . multivitamin  1 tablet Oral QHS  . pentoxifylline  400 mg Oral Q supper  . polyethylene glycol  17 g Oral Daily  . ramelteon  8 mg Oral QHS  . senna-docusate  1 tablet Oral QHS  . sodium chloride flush  3 mL Intravenous Q12H  . ticagrelor  90 mg Oral BID

## 2018-12-30 NOTE — TOC Transition Note (Signed)
Transition of Care [facility]) - CM/SW Discharge Note   Patient Details  Name: Kristen Berry MRN: [id number] Date of Birth: [birth year]  Transition of Care [facility]) CM/SW Contact:  Medhansh Brinkmeier, Francetta Found, LCSW Phone Number:[phone] 12/30/2018, 4:17 PM   Clinical Narrative:    Clinical Social Worker awaiting final confirmation regarding patient placement at [facility].  Information systems manager reviewing information and will notify when bed ready.  MD aware that it could be today or tomorrow.  CSW to provide RN with paperwork and number to call transport if discharge later today.  Clinical Social Worker facilitated patient discharge including contacting patient family and facility to confirm patient discharge plans.  Clinical information faxed to facility and family agreeable with plan.  CSW arranged ambulance transport via [facility] to [facility].  RN to call report prior to discharge.  Clinical Social Worker will sign off for now as social work intervention is no longer needed. Please consult [place] again if new need arises.    Final next level of care: Skilled Nursing Facility Barriers to Discharge: No Barriers Identified   Patient Goals and CMS Choice Patient states their goals for this hospitalization and ongoing recovery are:: See comment(Patient initially wanted to go home, however with her grandchildren out of school, [name] feels the best place to have rehab is in a skilled nursing facility) CMS Medicare.gov Compare Post Acute Care list provided to:: Patient Choice offered to / list presented to : Patient(Medicare list provided to patient)  Discharge Placement PASRR number recieved: 12/28/18            Patient chooses bed at: [facility] Patient to be transferred to facility by: Ambulance Name of family member notified: Patient to update family Patient and family notified of of transfer: 12/30/18  Discharge Plan and Services In-house Referral: Clinical Social  Work Discharge Planning Services: Other - See comment(Nurse case manager initally talked with patient and patient declined [place] services. PT worked with patient again today and SNF recommended) Post Acute Care Choice: [place]          DME Arranged: N/A DME Agency: NA HH Arranged: Refused SNF(Patient initially refused HH) [place] Agency: NA   Social Determinants of Health (SDOH) Interventions     Readmission Risk Interventions No flowsheet data found.

## 2018-12-30 NOTE — Discharge Instructions (Signed)
Osteomyelitis, Adult  Bone infections (osteomyelitis) occur when bacteria or other germs get inside a bone. This can happen if you have an infection in another part of your body that spreads through your blood. Germs from your skin or from outside of your body can also cause this type of infection if you have a wound or a broken bone (fracture) that breaks the skin. Bone infections need to be treated quickly to prevent bone damage and to prevent the infection from spreading to other areas of your body. What are the causes? Most bone infections are caused by bacteria. They can also be caused by other germs, such as viruses and funguses. What increases the risk? You are more likely to develop this condition if you:  Recently had surgery, especially bone or joint surgery.  Have a long-term (chronic) disease, such as: ? Diabetes. ? HIV (human immunodeficiency virus). ? Rheumatoid arthritis. ? Sickle cell anemia. ? Kidney disease that requires dialysis.  Are aged 64 years or older.  Have a condition or take medicines that block or weaken your body's defense system (immune system).  Have a condition that reduces your blood flow.  Have an artificial joint.  Have had a joint or bone repaired with plates or screws (surgical hardware).  Use IV drugs.  Have a central line for IV access.  Have had trauma, such as stepping on a nail or a broken bone that came through the skin. What are the signs or symptoms? Symptoms vary depending on the type and location of your infection. Common symptoms of bone infections include:  Fever and chills.  Skin redness and warmth.  Swelling.  Pain and stiffness.  Drainage of fluid or pus near the infection. How is this diagnosed? This condition may be diagnosed based on:  Your symptoms and medical history.  A physical exam.  Tests, such as: ? A sample of tissue, fluid, or blood taken to be examined under a microscope. ? Pus or discharge  swabbed from a wound for testing to identify germs and to determine what type of medicine will kill them (culture and sensitivity). ? Blood tests.  Imaging studies. These may include: ? X-rays. ? MRI. ? CT scan. ? Bone scan. ? Ultrasound. How is this treated? Treatment for this condition depends on the cause and type of infection. Antibiotic medicines are usually the first treatment for a bone infection. This may be done in a hospital at first. You may have to continue IV antibiotics at home or take antibiotics by mouth for several weeks after that. Other treatments may include surgery to remove:  Dead or dying tissue from a bone.  An infected artificial joint.  Infected plates or screws that were used to repair a broken bone. Follow these instructions at home: Medicines   Take over-the-counter and prescription medicines only as told by your health care provider.  Take your antibiotic medicine as told by your health care provider. Do not stop taking the antibiotic even if you start to feel better.  Follow instructions from your health care provider about how to take IV antibiotics at home. You may need to have a nurse come to your home to give you the IV antibiotics. General instructions   Ask your health care provider if you have any restrictions on your activities.  If directed, put ice on the affected area: ? Put ice in a plastic bag. ? Place a towel between your skin and the bag. ? Leave the ice on for  20 minutes, 2-3 times a day.  Wash your hands often with soap and water. If soap and water are not available, use hand sanitizer.  Do not use any products that contain nicotine or tobacco, such as cigarettes and e-cigarettes. These can delay bone healing. If you need help quitting, ask your health care provider.  Keep all follow-up visits as told by your health care provider. This is important. Contact a health care provider if:  You develop a fever or chills.  You  have redness, warmth, pain, or swelling that returns after treatment. Get help right away if:  You have rapid breathing or you have trouble breathing.  You have chest pain.  You cannot drink fluids or make urine.  The affected area swells, changes color, or turns blue.  You have numbness or severe pain in the affected area. Summary  Bone infections (osteomyelitis) occur when bacteria or other germs get inside a bone.  You may be more likely to get this type of infection if you have a condition, such as diabetes, that lowers your ability to fight infection or increases your chances of getting an infection.  Most bone infections are caused by bacteria. They can also be caused by other germs, such as viruses and funguses.  Treatment for this condition usually starts with taking antibiotics. Further treatment depends on the cause and type of infection. This information is not intended to replace advice given to you by your health care provider. Make sure you discuss any questions you have with your health care provider. Document Released: 09/13/2005 Document Revised: 09/22/2017 Document Reviewed: 09/22/2017 Elsevier Interactive Patient Education  2019 [place] on my medicine - ELIQUIS (apixaban)  Why was Eliquis prescribed for you? Eliquis was prescribed for you to reduce the risk of forming blood clots that can cause a stroke if you have a medical condition called atrial fibrillation (a type of irregular heartbeat) OR to reduce the risk of a blood clots forming after orthopedic surgery.  What do You need to know about Eliquis ? Take your Eliquis TWICE DAILY - one tablet in the morning and one tablet in the evening with or without food.  It would be best to take the doses about the same time each day.  If you have difficulty swallowing the tablet whole please discuss with your pharmacist how to take the medication safely.  Take Eliquis exactly as prescribed by  your doctor and DO NOT stop taking Eliquis without talking to the doctor who prescribed the medication.  Stopping may increase your risk of developing a new clot or stroke.  Refill your prescription before you run out.  After discharge, you should have regular check-up appointments with your healthcare provider that is prescribing your Eliquis.  In the future your dose may need to be changed if your kidney function or weight changes by a significant amount or as you get older.  What do you do if you miss a dose? If you miss a dose, take it as soon as you remember on the same day and resume taking twice daily.  Do not take more than one dose of ELIQUIS at the same time.  Important Safety Information A possible side effect of Eliquis is bleeding. You should call your healthcare provider right away if you experience any of the following: ? Bleeding from an injury or your nose that does not stop. ? Unusual colored urine (red or dark brown) or unusual colored stools (red or black). ? Unusual  bruising for unknown reasons. ? A serious fall or if you hit your head (even if there is no bleeding).  Some medicines may interact with Eliquis and might increase your risk of bleeding or clotting while on Eliquis. To help avoid this, consult your healthcare provider or pharmacist prior to using any new prescription or non-prescription medications, including herbals, vitamins, non-steroidal anti-inflammatory drugs (NSAIDs) and supplements.  This website has more information on Eliquis (apixaban): [email or web address].no.

## 2018-12-30 NOTE — Progress Notes (Signed)
Family Medicine Teaching Service Daily Progress Note Intern Pager: [phone]  Patient name: Kristen Berry Medical record number: [id number] Date of birth: 01-20-1956 Age: 63 y.o. Gender: female  Primary Care Provider: [place], Sarah T, MD Consultants: orthopedics, nephrology, PT/OT Code Status: DNR  Pt Overview and Major Events to Date:  3/27: Amp R-great to /dt osteomyelitis 4/01: Fistulogram made by VVS 4/02: COVID test obtained per SNF request, medical stable for d/c 4/04: COVID19 test negative.   Assessment and Plan: Kristen Mungin Duffyis a 63 y.o.femalepresenting with osteomyelitis of the right great toe.[name] PMH is significant forosteomyelitis of the left toes s/p amputation, ESRD, diabetes type 2, hypertension, history of DVT, history of stroke, CHF, MGUS.   Osteomyelitis of Right Great toe Right leg cellulitis S/P ray amp on 3/27. Wound vac in place with good seal. Follow up with Dr. Sharol Given in one week.  Poor blood flow with stenting of RLE by vascular. Plan to stop ASA and brillinta in 3 mo - Continue eliquis, brilinta and aspirin  - continue vanc, pharmacy to dose, with dialysis until 4/8 - tylenol as needed - oxycodone 5mg  q12h prn - wound vac and wound management per ortho and wound care   ESRD on HDTThS, chronic, stable: Dialysis TTS. Dialyzes through R IJ HD cath.  - Dialysis per nephro - Renally dose medications  - Continue PhosLo  Diabetes mellitus type 2: A1c 5.5% 12/22/2018.Most recent CBGs 114-140 requiring only 1U of aspart in past 24 hours. Diet controlled. - d/c insulin   5.  HFrEF:January 2019 patient had echo that showed LVEF 35 to 40%. Septal and apical akinesis, moderately dilated LV, mild LVH. -Continue HD. No other tx at this time.  6.  CAD/PAD: left heart cath in February 2019 showed severe diffuse diabetic coronary disease, 70 to 80% stenosis of proximal LAD 75% obstruction of RCA -Continue eliquis, Brilinta, and Trental  7.  HTN, chronic,  stable: BP 115/50. Takes amlodipine 5mg , Coreg 25mg  BID, hydralazine 25mg  BID at home. -Continue home meds  8.  HLD: pt takes 20mg  lipitor at home. Last Lipid panel January 2019 with Trig 180, HDL 39, and LDL 109. -Continue Lipitor  9.  History of left leg DVT:pt took warfarin at home 4mg  TTSS/5mg  MWF.Patient has history of DVT in left leg. Unknown when she first started warfarin, has been at least 1 year. Patient currently subtherapeutic with INR of 1.2.  -Continue Brilinta, Trental, heparin (w/ HD only) -d/c home warfarin -continue eliquis  FEN/GI: renal diet w/fluid restriction PPx: heparin, Eliquis, Brilinta, Trental  Disposition: Medically stable for discharge to SNF given negative COVID19 testing  Subjective:  No acute events overnight. Feels well this AM.    Objective: Temp:  [97.9 F (36.6 C)-98.1 F (36.7 C)] 98.1 F (36.7 C) (04/04 0700) Pulse Rate:  [69-98] 75 (04/04 0900) Resp:  [13-18] 16 (04/04 0700) BP: (116-139)/(45-52) 121/52 (04/04 0900) SpO2:  [96 %-100 %] 96 % (04/04 0700) Weight:  [80.4 kg-84 kg] 84 kg (04/04 0700) Physical Exam: General: well nourished, well developed, NAD with non-toxic appearance HEENT: normocephalic, atraumatic, moist mucous membranes Cardiovascular: regular rate and rhythm without murmurs, rubs, or gallops, fistula site intact with good thrill Lungs: clear to auscultation bilaterally with normal work of breathing Abdomen: soft, non-tender, non-distended, normoactive bowel sounds Skin: warm, dry, no rashes or lesions, cap refill < 2 seconds Extremities: warm and well perfused, normal tone, no edema, wound vac in place with good output and dry dressings  Laboratory: Recent Labs  Lab 12/28/18 0318 12/29/18 0226 12/30/18 0235  WBC 13.0* 14.4* 14.3*  HGB 11.3* 11.0* 10.7*  HCT 35.2* 34.8* 33.0*  PLT 477* 434* 426*   Recent Labs  Lab 12/28/18 0318 12/29/18 0226 12/30/18 0235  NA 129* 131* 130*  K 4.5 3.6 3.7  CL 94*  92* 95*  CO2 21* 25 24  BUN 37* 18 34*  CREATININE 4.46* 3.24* 4.73*  CALCIUM 9.2 8.9 9.1  GLUCOSE 90 88 118*   Hep B surface antigen: negative  Imaging/Diagnostic Tests: 3/26 - DG Right Foot: IMPRESSION: 1. Osteomyelitis of the distal phalanx of the right great toe involving the distal tuft and lateral aspect of the proximal to mid phalanx. 2. There is surrounding soft tissue swelling, without soft tissue air. 3. No fracture, dislocation or other evidence of osteomyelitis.    Bonnita Hollow, MD 12/30/2018, 9:31 AM PGY-2, [place] Intern pager: [phone], text pages welcome

## 2018-12-30 NOTE — Telephone Encounter (Signed)
Entered in error

## 2018-12-31 LAB — CBC
HCT: 33.1 % — ABNORMAL LOW (ref 36.0–46.0)
Hemoglobin: 10.5 g/dL — ABNORMAL LOW (ref 12.0–15.0)
MCH: 32.2 pg (ref 26.0–34.0)
MCHC: 31.7 g/dL (ref 30.0–36.0)
MCV: 101.5 fL — ABNORMAL HIGH (ref 80.0–100.0)
Platelets: 450 10*3/uL — ABNORMAL HIGH (ref 150–400)
RBC: 3.26 MIL/uL — ABNORMAL LOW (ref 3.87–5.11)
RDW: 14.7 % (ref 11.5–15.5)
WBC: 14.1 10*3/uL — ABNORMAL HIGH (ref 4.0–10.5)
nRBC: 0 % (ref 0.0–0.2)

## 2018-12-31 LAB — RENAL FUNCTION PANEL
Albumin: 2.8 g/dL — ABNORMAL LOW (ref 3.5–5.0)
Anion gap: 13 (ref 5–15)
BUN: 26 mg/dL — ABNORMAL HIGH (ref 8–23)
CO2: 22 mmol/L (ref 22–32)
Calcium: 9.1 mg/dL (ref 8.9–10.3)
Chloride: 96 mmol/L — ABNORMAL LOW (ref 98–111)
Creatinine, Ser: 3.55 mg/dL — ABNORMAL HIGH (ref 0.44–1.00)
GFR calc Af Amer: 15 mL/min — ABNORMAL LOW (ref >60)
GFR calc non Af Amer: 13 mL/min — ABNORMAL LOW (ref >60)
Glucose, Bld: 90 mg/dL (ref 70–99)
Phosphorus: 3.4 mg/dL (ref 2.5–4.6)
Potassium: 3.9 mmol/L (ref 3.5–5.1)
Sodium: 131 mmol/L — ABNORMAL LOW (ref 135–145)

## 2018-12-31 LAB — GLUCOSE, CAPILLARY
Glucose-Capillary: 95 mg/dL (ref 70–99)
Glucose-Capillary: 132 mg/dL — ABNORMAL HIGH (ref 70–99)

## 2018-12-31 MED ORDER — OXYCODONE HCL 5 MG PO TABS
2.5000 mg | ORAL_TABLET | Freq: Once | ORAL | Status: AC
  Administered 2018-12-31: 2.5 mg via ORAL
  Filled 2018-12-31: qty 1

## 2018-12-31 NOTE — Progress Notes (Signed)
Pt complaining of pain in R foot surgical site. Pt already given scheduled Tylenol 650mg  and oxycodone 5mg  q12. Text-paged FMTS for further medication order/change. Pt says pain is 7/10 and moves from her toe up her leg. Will continue to monitor. Lajoyce Corners, RN

## 2018-12-31 NOTE — Progress Notes (Signed)
[facility] Progress Note   [facility] [facility]  4h  3K/2.25 bath   81.5kg   RIJ TDC  Hep 3000 w/ 3047midrun - no ESA or Fe  - calctiriol 0.75 Recent labs: hgb 11.4  ipTH 313  Assessment/Plan: 1.  R great toe osteomyelitis s/p 1st ray amp 3/27 with VAC -  Dr. Sharol Given -.  after d/c. S/p percut revasc of occluded R SFA w/ overlapping stents on 4/1 by VVS. On Vanc D#9/14 today. To get vanc IV at OP HD unit to complete course 2.  ESRD -  TTS HD.  HD Tuesday if still here. May be dc'd to SNF today 3.  Hypertension/volume  - stable vol on exam, up 2-3kg by wts 4.  Anemia  ckd - hgb 11's,  no ESA 5.  Metabolic bone disease -  continue VDRA/phoslo- Ca creeping up - watch - may need non Ca based binder - HD unit to f/u after d/c 6.  Nutrition - renal carb mod diet/vits 7.  Lack of permanent dialysis access -had fistulogram on 4/1 Dr Trula Slade planning fistula elevation and branch ligation procedure in the future - will need to have DAPT stopped 48 hours in advance of future ortho or vascular procedures  8. Hx of bed bugs - she indicates this has cleared but HD unit continues to abide by these precautions 9. Hx CVA/CAD 10. Hx DVT on eliquis renally dosed 11. Dispo - possible dc today to SNF   [facility] 12/31/2018, 11:28 AM    Subjective:   Seen in room, no c/o  Objective Vitals:   12/30/18 1939 12/31/18 0454 12/31/18 0944 12/31/18 0954  BP: (!) 119/51 (!) 124/42 (!) 97/50 (!) 99/49  Pulse: 81 73    Resp: 18 14 17    Temp: 98.4 F (36.9 C) 98.4 F (36.9 C)    TempSrc: Oral Oral    SpO2: 100% 97%    Weight:  84.3 kg    Height:       Physical Exam General: NAD  Heart: RRR  Lungs: no rales Abdomen: + BS soft NT  Extremities:right foot ray amp with wound VAC  Dialysis Access: TDC right IJ and poorly maturing AVF    Additional Objective Labs: Basic Metabolic Panel: Recent Labs  Lab 12/29/18 0226 12/30/18 0235 12/31/18 0440  NA 131* 130* 131*  K  3.6 3.7 3.9  CL 92* 95* 96*  CO2 25 24 22   GLUCOSE 88 118* 90  BUN 18 34* 26*  CREATININE 3.24* 4.73* 3.55*  CALCIUM 8.9 9.1 9.1  PHOS 3.7 5.1* 3.4   Liver Function Tests: Recent Labs  Lab 12/29/18 0226 12/30/18 0235 12/31/18 0440  ALBUMIN 3.0* 2.9* 2.8*   No results for input(s): LIPASE, AMYLASE in the last 168 hours. CBC: Recent Labs  Lab 12/27/18 1537 12/28/18 0318 12/29/18 0226 12/30/18 0235 12/31/18 0440  WBC 12.4* 13.0* 14.4* 14.3* 14.1*  HGB 11.8* 11.3* 11.0* 10.7* 10.5*  HCT 37.4 35.2* 34.8* 33.0* 33.1*  MCV 102.7* 101.1* 101.5* 101.5* 101.5*  PLT 468* 477* 434* 426* 450*   Blood Culture    Component Value Date/Time   SDES BLOOD LEFT HAND 10/19/2017 1500   SPECREQUEST  10/19/2017 1500    BOTTLES DRAWN AEROBIC AND ANAEROBIC Blood Culture adequate volume   CULT NO GROWTH 5 DAYS 10/19/2017 1500   REPTSTATUS 10/24/2017 FINAL 10/19/2017 1500    Cardiac Enzymes: No results for input(s): CKTOTAL, CKMB, CKMBINDEX, TROPONINI in the last 168 hours. CBG:  Recent Labs  Lab 12/30/18 1259 12/30/18 1627 12/30/18 2051 12/31/18 0555 12/31/18 1121  GLUCAP 128* 162* 119* 95 132*   Iron Studies: No results for input(s): IRON, TIBC, TRANSFERRIN, FERRITIN in the last 72 hours. Lab Results  Component Value Date   INR 1.2 12/27/2018   INR 1.7 (H) 12/22/2018   INR 1.6 (H) 12/21/2018   Studies/Results: No results found. Medications: . sodium chloride    . sodium chloride    . vancomycin 750 mg (12/30/18 1039)   . acetaminophen  650 mg Oral Q6H   Or  . acetaminophen  650 mg Rectal Q6H  . amLODipine  5 mg Oral QPM  . apixaban  2.5 mg Oral BID  . aspirin EC  81 mg Oral Daily  . atorvastatin  20 mg Oral q1800  . calcitRIOL  0.75 mcg Oral Q T,Th,Sa-HD  . calcium acetate  667 mg Oral BID WC  . carvedilol  25 mg Oral BID  . Chlorhexidine Gluconate Cloth  6 each Topical Q0600  . Chlorhexidine Gluconate Cloth  6 each Topical Q0600  . gabapentin  100 mg Oral QHS  .  hydrALAZINE  25 mg Oral BID  . multivitamin  1 tablet Oral QHS  . pentoxifylline  400 mg Oral Q supper  . polyethylene glycol  17 g Oral Daily  . ramelteon  8 mg Oral QHS  . senna-docusate  1 tablet Oral QHS  . sodium chloride flush  3 mL Intravenous Q12H  . ticagrelor  90 mg Oral BID

## 2018-12-31 NOTE — Discharge Summary (Signed)
[facility] Discharge Summary  Patient name: Kristen Berry Medical record number: [id number] Date of birth: 1956/07/28 Age: 63 y.o. Gender: female Date of Admission: 12/21/2018  Date of Discharge: 12/31/2018 Admitting Physician: Leeanne Rio, MD  Primary Care Provider: Martinique, Sarah T, MD Consultants: Orthopedic surgery, nephrology, VVS  Indication for Hospitalization: Osteomyelitis of right great toe  Discharge Diagnoses/Problem List:  Osteomyelitis of right great toe Right leg cellulitis ESRD on HD TTS Type 2 diabetes CAD History of stroke Mild asthma History of bilateral DVTs HFrEF First-degree AV block Hypertension Hyperlipidemia MGUS  Disposition: SNF  Discharge Condition: Stable, improved  Discharge Exam: General: well nourished, well developed, NAD with non-toxic appearance HEENT: normocephalic, atraumatic, moist mucous membranes Cardiovascular: regular rate and rhythm without murmurs, rubs, or gallops, fistula site intact with good thrill Lungs: clear to auscultation bilaterally with normal work of breathing Abdomen: soft, non-tender, non-distended, normoactive bowel sounds Skin: warm, dry, no rashes or lesions, cap refill < 2 seconds Extremities: warm and well perfused, normal tone, no edema, wound vac in place with good output and dry dressings   Brief Hospital Course:  Kristen Berry is a 63 year old female admitted to the hospital for infection of her right great toe that was found during a dialysis session.  X-ray of the toe revealed osteomyelitis. IV antibiotics vancomycin and ceftazidime with hemodialysis were started on 3/26 to be continued through 01/03/2019. Orthopedic surgery was consulted and the toe was removed on 12/22/2018.  During the toe amputation procedure it was noted the patient had poor blood flow to the foot. Vascular Surgery was consulted for right leg arteriography/left upper extremity fistulogram.  While awaiting  these procedures the patient was continued on Brilinta, Trental, and a heparin drip for her history of stroke and bilateral lower extremity DVTs status post amputation of 2 toes of her left foot.  Patient did well post-op and wound vac was discontinued on day of discharge. Her surgical site was cleaned with betadine and covered with dry dressing.   The fistulogram and arteriography were performed on 12/27/2018. RLE arteriogram showed midportion SFA occlusion and reconstitution. A stent was placed. LLE arteriogram revealed stenosis within previous segmented stent. Patient with only single vessel runoff vita ATA on right. Patient was started on eliquis 2.5mg  bid after her procedure which she was discharged on. She will follow up with Dr. Sharol Given in one week for re-eval of toe. Will follow up with Dr. Trula Slade in 3-4 weeks for BLE duplex U/S and ABIs. Fistula was noted to be patent. Will need elevation and branch vessel ligation electively as outpatient.  Per request of SNF, patient was to have a test to r/o COVID19 before acceptance. SARS-CoV-2 was negative.   Issues for Follow Up:  1. Follow up with Dr. Sharol Given in one week 2. Follow up with Dr. Trula Slade in 3-4 weeks 3. Continue brilinta and asa for DAPT 4. Stopped Warfarin, continue eliquis for anticoag. Patient on multiple antiplatelets and anticoagulation. Consider escalation if felt necessary.   Significant Procedures:  12/22/2018 - Amputation of R great toe 12/27/2018 - fistulagram, RLE/LLE arteriogram with stent placement in RLE  Significant Labs and Imaging:  Recent Labs  Lab 12/29/18 0226 12/30/18 0235 12/31/18 0440  WBC 14.4* 14.3* 14.1*  HGB 11.0* 10.7* 10.5*  HCT 34.8* 33.0* 33.1*  PLT 434* 426* 450*   Recent Labs  Lab 12/27/18 1537 12/28/18 0318 12/29/18 0226 12/30/18 0235 12/31/18 0440  NA 130* 129* 131* 130* 131*  K 4.5 4.5  3.6 3.7 3.9  CL 95* 94* 92* 95* 96*  CO2 23 21* 25 24 22   GLUCOSE 110* 90 88 118* 90  BUN 28* 37* 18 34*  26*  CREATININE 3.96* 4.46* 3.24* 4.73* 3.55*  CALCIUM 9.3 9.2 8.9 9.1 9.1  PHOS 5.1* 5.4* 3.7 5.1* 3.4  ALBUMIN 3.1* 3.0* 3.0* 2.9* 2.8*   Dg Foot Complete Right  Result Date: 12/21/2018 CLINICAL DATA:  Pt presents with necrotic R big toe, reports 1 week h/o of redness, drainage with foul odor. Pt states the pain radiates from her big toe up into her lower leg, right foot and lower leg has associated swelling and redness. Pt denies any injury to right foot. Hx of diabetes and PVD. EXAM: RIGHT FOOT COMPLETE - 3+ VIEW COMPARISON:  None. FINDINGS: There is resorption of a portion of the distal tuft, along its plantar lateral margin, and along the lateral aspect of the proximal to mid aspect of the great toe distal phalanx, findings consistent with osteomyelitis. There are no other areas of bone resorption to suggest additional osteomyelitis. No fracture.  Joints are normally aligned. There is soft tissue swelling of the great toe without soft tissue air. Large dorsal and plantar calcaneal spurs. IMPRESSION: 1. Osteomyelitis of the distal phalanx of the right great toe involving the distal tuft and lateral aspect of the proximal to mid phalanx. 2. There is surrounding soft tissue swelling, without soft tissue air. 3. No fracture, dislocation or other evidence of osteomyelitis. Electronically Signed   By: Lajean Manes M.D.   On: 12/21/2018 18:01   SARS-CoV-2 Panel Negative  Results/Tests Pending at Time of Discharge:  Unresulted Labs (From admission, onward)    Start     Ordered   12/28/18 0500  Renal function panel  Daily,   R     12/27/18 1328   12/23/18 0800  CBC  Daily,   R     12/22/18 2309          Discharge Medications:  Allergies as of 12/31/2018      Reactions   Crestor [rosuvastatin Calcium] Other (See Comments)   Leg pain      Medication List    STOP taking these medications   nitroGLYCERIN 0.4 MG SL tablet Commonly known as:  NITROSTAT   warfarin 4 MG tablet Commonly  known as:  COUMADIN   warfarin 5 MG tablet Commonly known as:  Coumadin     TAKE these medications   acetaminophen 500 MG tablet Commonly known as:  TYLENOL Take 1,500 mg by mouth daily as needed for mild pain or headache.   alteplase 2 MG injection Commonly known as:  CATHFLO ACTIVASE 2 mg by Intracatheter route once as needed for open catheter.   amLODipine 5 MG tablet Commonly known as:  NORVASC Take 5 mg by mouth every evening.   apixaban 2.5 MG Tabs tablet Commonly known as:  ELIQUIS Take 1 tablet (2.5 mg total) by mouth 2 (two) times daily.   aspirin 81 MG EC tablet Take 1 tablet (81 mg total) by mouth daily.   atorvastatin 20 MG tablet Commonly known as:  LIPITOR Take 1 tablet (20 mg total) by mouth daily at 6 PM.   calcitRIOL 0.25 MCG capsule Commonly known as:  ROCALTROL Take 3 capsules (0.75 mcg total) by mouth Every Tuesday,Thursday,and Saturday with dialysis.   calcium acetate 667 MG capsule Commonly known as:  PHOSLO Take 667 mg by mouth 2 (two) times daily with a meal.  carvedilol 25 MG tablet Commonly known as:  COREG Take 25 mg by mouth 2 (two) times daily.   clotrimazole-betamethasone cream Commonly known as:  LOTRISONE Apply 1 application topically daily as needed (rash).   diphenoxylate-atropine 2.5-0.025 MG tablet Commonly known as:  LOMOTIL Take 1 tablet by mouth every 12 (twelve) hours as needed for diarrhea or loose stools.   gabapentin 100 MG capsule Commonly known as:  NEURONTIN Take 1 capsule (100 mg total) by mouth at bedtime.   heparin 1000 unit/mL Soln injection 1 mL (1,000 Units total) by Dialysis route as needed (in dialysis).   heparin 1000 unit/mL Soln injection 1.6 mLs (1,600 Units total) by Dialysis route as needed (in dialysis).   heparin 1000 unit/mL Soln injection 3 mLs (3,000 Units total) by Dialysis route as needed (in dialysis).   hydrALAZINE 25 MG tablet Commonly known as:  APRESOLINE Take 25 mg by mouth 2  (two) times daily.   lidocaine-prilocaine cream Commonly known as:  EMLA Apply 1 application topically daily as needed (dialysis).   multivitamin Tabs tablet Take 1 tablet by mouth at bedtime.   oxyCODONE 5 MG immediate release tablet Commonly known as:  Oxy IR/ROXICODONE Take 1 tablet (5 mg total) by mouth every 12 (twelve) hours as needed for moderate pain or severe pain.   pantoprazole 40 MG tablet Commonly known as:  PROTONIX Take 40 mg by mouth every evening.   pentafluoroprop-tetrafluoroeth Aero Commonly known as:  GEBAUERS Apply 1 application topically as needed (topical anesthesia for hemodialysis).   pentoxifylline 400 MG CR tablet Commonly known as:  TRENTAL Take 1 tablet (400 mg total) by mouth daily with supper.   polyethylene glycol packet Commonly known as:  MIRALAX / GLYCOLAX Take 17 g by mouth daily as needed for mild constipation.   ProAir HFA 108 (90 Base) MCG/ACT inhaler Generic drug:  albuterol Inhale 2 puffs into the lungs every 6 (six) hours as needed for wheezing or shortness of breath.   saccharomyces boulardii 250 MG capsule Commonly known as:  FLORASTOR Take 1 capsule (250 mg total) by mouth 2 (two) times daily. What changed:  when to take this   ticagrelor 90 MG Tabs tablet Commonly known as:  BRILINTA Take 90 mg by mouth 2 (two) times daily.   Vancomycin 750-5 MG/150ML-% Soln Commonly known as:  VANCOCIN Inject 150 mLs (750 mg total) into the vein Every Tuesday,Thursday,and Saturday with dialysis for 4 days.            Durable Medical Equipment  (From admission, onward)         Start     Ordered   12/26/18 1515  For home use only DME lightweight manual wheelchair with seat cushion  Once    Comments:  Patient suffers from limited mobility, recent toe amputation which impairs their ability to perform daily activities like bathing and toileting in the home.  A walker will not resolve  issue with performing activities of daily living.  A wheelchair will allow patient to safely perform daily activities. Patient is not able to propel themselves in the home using a standard weight wheelchair due to endurance and general weakness. Patient can self propel in the lightweight wheelchair.  Accessories: elevating leg rests (ELRs), wheel locks, extensions and anti-tippers.   12/26/18 1516        Discharge Instructions: Please refer to Patient Instructions section of EMR for full details.  Patient was counseled important signs and symptoms that should prompt return to medical care,  changes in medications, dietary instructions, activity restrictions, and follow up appointments.   Follow-Up Appointments:  Contact information for follow-up providers    Newt Minion, MD In 1 week.   Specialty:  Orthopedic Surgery Contact information: [place] [address] [phone]        Serafina Mitchell, MD Follow up in 3 week(s).   Specialties:  Vascular Surgery, Cardiology Why:  office will call Contact information: [place] [address] [phone]        Martinique, Sarah T, MD. Go in 1 week(s).   Specialty:  Family Medicine Contact information: [address] [place] [address] [phone]            Contact information for after-discharge care    Destination    [facility] Preferred SNF .   Service:  Skilled Nursing Contact information: [address] [place]                 Wilber Oliphant, MD 12/31/2018, 10:24 AM PGY-2, [place]

## 2018-12-31 NOTE — Progress Notes (Signed)
Mobile and spoke to Clyde Hill, Librarian, academic.  Report given and all questions answered. Pt being transported

## 2018-12-31 NOTE — TOC Transition Note (Signed)
Transition of Care [facility]) - CM/SW Discharge Note   Patient Details  Name: Kristen Berry MRN: [id number] Date of Birth: 28-Jun-1956  Transition of Care [facility]) CM/SW Contact:  Oretha Milch, LCSW Phone Number: 12/31/2018, 10:50 AM   Clinical Narrative:   Discharge was delayed as patient had a wound vac on 12/30/2018 and [facility] requested additional information regarding her wound vac. CSW spoke with RN and noted plans for wound vac to be removed and replaced with dry dressing today. CSW informed [facility] and noted they can receive patient. CSW is updating discharge assessment and informed RN. Patient is expected to leave this afternoon via PTAR.     Final next level of care: Skilled Nursing Facility Barriers to Discharge: Barriers Resolved   Patient Goals and CMS Choice Patient states their goals for this hospitalization and ongoing recovery are:: Patient initially wanted to go home, however with her grandchildren out of school, Ms. Hillhouse feels the best place to have rehab is in a skilled nursing facility CMS Medicare.gov Compare Post Acute Care list provided to:: Patient Choice offered to / list presented to : Patient  Discharge Placement PASRR number recieved: 12/28/18            Patient [facility] at: [facility] Patient to be transferred to facility by: [place] Name of family member notified: Patient to update family Patient and family notified of of transfer: 12/28/18  Discharge Plan and Services In-house Referral: Clinical Social Work Discharge Planning Services: Other - See comment(Nurse case manager initally talked with patient and patient declined [facility] services. PT worked with patient again today and SNF recommended) Post Acute Care Choice: [place]          DME Arranged: N/A DME Agency: NA HH Arranged: Refused SNF(Patient initially refused HH) [place] Agency: NA   Social Determinants of Health (SDOH) Interventions     Readmission  Risk Interventions No flowsheet data found.

## 2018-12-31 NOTE — Progress Notes (Signed)
Spoke with Dr. Sharol Given after removing wound vac. Will apply 4 x 4 gauze and curlex wrap. Pt to be non-weight bearing. Pt has surgical shoe in room to go with her to facility. Pt will follow up with Dr. Sharol Given in a week. Payton Emerald, RN

## 2019-01-02 ENCOUNTER — Encounter (HOSPITAL_COMMUNITY): Payer: Self-pay

## 2019-01-02 ENCOUNTER — Emergency Department (HOSPITAL_COMMUNITY): Payer: Medicare HMO

## 2019-01-02 ENCOUNTER — Inpatient Hospital Stay (HOSPITAL_COMMUNITY)
Admission: EM | Admit: 2019-01-02 | Discharge: 2019-01-10 | DRG: 239 | Disposition: A | Discharge status: Skilled Nursing Facility | Payer: Medicare HMO | Attending: Family Medicine | Admitting: Family Medicine

## 2019-01-02 ENCOUNTER — Other Ambulatory Visit: Payer: Self-pay

## 2019-01-02 DIAGNOSIS — I2583 Coronary atherosclerosis due to lipid rich plaque: Secondary | ICD-10-CM | POA: Diagnosis present

## 2019-01-02 DIAGNOSIS — L03039 Cellulitis of unspecified toe: Secondary | ICD-10-CM

## 2019-01-02 DIAGNOSIS — E1122 Type 2 diabetes mellitus with diabetic chronic kidney disease: Secondary | ICD-10-CM | POA: Diagnosis present

## 2019-01-02 DIAGNOSIS — Z8249 Family history of ischemic heart disease and other diseases of the circulatory system: Secondary | ICD-10-CM

## 2019-01-02 DIAGNOSIS — E785 Hyperlipidemia, unspecified: Secondary | ICD-10-CM | POA: Diagnosis present

## 2019-01-02 DIAGNOSIS — Z992 Dependence on renal dialysis: Secondary | ICD-10-CM

## 2019-01-02 DIAGNOSIS — E119 Type 2 diabetes mellitus without complications: Secondary | ICD-10-CM | POA: Diagnosis not present

## 2019-01-02 DIAGNOSIS — Z66 Do not resuscitate: Secondary | ICD-10-CM | POA: Diagnosis present

## 2019-01-02 DIAGNOSIS — I96 Gangrene, not elsewhere classified: Secondary | ICD-10-CM | POA: Diagnosis present

## 2019-01-02 DIAGNOSIS — L089 Local infection of the skin and subcutaneous tissue, unspecified: Secondary | ICD-10-CM | POA: Diagnosis not present

## 2019-01-02 DIAGNOSIS — S88119A Complete traumatic amputation at level between knee and ankle, unspecified lower leg, initial encounter: Secondary | ICD-10-CM | POA: Diagnosis not present

## 2019-01-02 DIAGNOSIS — I132 Hypertensive heart and chronic kidney disease with heart failure and with stage 5 chronic kidney disease, or end stage renal disease: Secondary | ICD-10-CM | POA: Diagnosis present

## 2019-01-02 DIAGNOSIS — L03115 Cellulitis of right lower limb: Secondary | ICD-10-CM | POA: Diagnosis present

## 2019-01-02 DIAGNOSIS — Z7901 Long term (current) use of anticoagulants: Secondary | ICD-10-CM

## 2019-01-02 DIAGNOSIS — Z8673 Personal history of transient ischemic attack (TIA), and cerebral infarction without residual deficits: Secondary | ICD-10-CM

## 2019-01-02 DIAGNOSIS — E876 Hypokalemia: Secondary | ICD-10-CM | POA: Diagnosis not present

## 2019-01-02 DIAGNOSIS — B888 Other specified infestations: Secondary | ICD-10-CM | POA: Diagnosis present

## 2019-01-02 DIAGNOSIS — Z79899 Other long term (current) drug therapy: Secondary | ICD-10-CM

## 2019-01-02 DIAGNOSIS — N2581 Secondary hyperparathyroidism of renal origin: Secondary | ICD-10-CM | POA: Diagnosis present

## 2019-01-02 DIAGNOSIS — Z7902 Long term (current) use of antithrombotics/antiplatelets: Secondary | ICD-10-CM | POA: Diagnosis not present

## 2019-01-02 DIAGNOSIS — J302 Other seasonal allergic rhinitis: Secondary | ICD-10-CM | POA: Diagnosis present

## 2019-01-02 DIAGNOSIS — Z89511 Acquired absence of right leg below knee: Secondary | ICD-10-CM | POA: Diagnosis not present

## 2019-01-02 DIAGNOSIS — Z7982 Long term (current) use of aspirin: Secondary | ICD-10-CM

## 2019-01-02 DIAGNOSIS — I5022 Chronic systolic (congestive) heart failure: Secondary | ICD-10-CM | POA: Diagnosis present

## 2019-01-02 DIAGNOSIS — E669 Obesity, unspecified: Secondary | ICD-10-CM | POA: Diagnosis present

## 2019-01-02 DIAGNOSIS — I2510 Atherosclerotic heart disease of native coronary artery without angina pectoris: Secondary | ICD-10-CM | POA: Diagnosis present

## 2019-01-02 DIAGNOSIS — N186 End stage renal disease: Secondary | ICD-10-CM | POA: Diagnosis present

## 2019-01-02 DIAGNOSIS — I70261 Atherosclerosis of native arteries of extremities with gangrene, right leg: Secondary | ICD-10-CM

## 2019-01-02 DIAGNOSIS — E1152 Type 2 diabetes mellitus with diabetic peripheral angiopathy with gangrene: Secondary | ICD-10-CM | POA: Diagnosis present

## 2019-01-02 DIAGNOSIS — D472 Monoclonal gammopathy: Secondary | ICD-10-CM | POA: Diagnosis present

## 2019-01-02 DIAGNOSIS — Z89421 Acquired absence of other right toe(s): Secondary | ICD-10-CM

## 2019-01-02 DIAGNOSIS — Z6828 Body mass index (BMI) 28.0-28.9, adult: Secondary | ICD-10-CM

## 2019-01-02 DIAGNOSIS — Z86718 Personal history of other venous thrombosis and embolism: Secondary | ICD-10-CM

## 2019-01-02 DIAGNOSIS — Z89422 Acquired absence of other left toe(s): Secondary | ICD-10-CM | POA: Diagnosis not present

## 2019-01-02 DIAGNOSIS — Z20828 Contact with and (suspected) exposure to other viral communicable diseases: Secondary | ICD-10-CM | POA: Diagnosis present

## 2019-01-02 DIAGNOSIS — D631 Anemia in chronic kidney disease: Secondary | ICD-10-CM | POA: Diagnosis present

## 2019-01-02 DIAGNOSIS — L0390 Cellulitis, unspecified: Secondary | ICD-10-CM | POA: Diagnosis present

## 2019-01-02 DIAGNOSIS — B889 Infestation, unspecified: Secondary | ICD-10-CM | POA: Diagnosis not present

## 2019-01-02 DIAGNOSIS — Z95828 Presence of other vascular implants and grafts: Secondary | ICD-10-CM

## 2019-01-02 DIAGNOSIS — Z888 Allergy status to other drugs, medicaments and biological substances status: Secondary | ICD-10-CM

## 2019-01-02 DIAGNOSIS — J45909 Unspecified asthma, uncomplicated: Secondary | ICD-10-CM | POA: Diagnosis not present

## 2019-01-02 DIAGNOSIS — I5020 Unspecified systolic (congestive) heart failure: Secondary | ICD-10-CM | POA: Diagnosis not present

## 2019-01-02 DIAGNOSIS — Z794 Long term (current) use of insulin: Secondary | ICD-10-CM

## 2019-01-02 LAB — BASIC METABOLIC PANEL
Anion gap: 15 (ref 5–15)
BUN: 29 mg/dL — ABNORMAL HIGH (ref 8–23)
CO2: 24 mmol/L (ref 22–32)
Calcium: 9.4 mg/dL (ref 8.9–10.3)
Chloride: 96 mmol/L — ABNORMAL LOW (ref 98–111)
Creatinine, Ser: 3.45 mg/dL — ABNORMAL HIGH (ref 0.44–1.00)
GFR calc Af Amer: 16 mL/min — ABNORMAL LOW (ref >60)
GFR calc non Af Amer: 13 mL/min — ABNORMAL LOW (ref >60)
Glucose, Bld: 120 mg/dL — ABNORMAL HIGH (ref 70–99)
Potassium: 3.7 mmol/L (ref 3.5–5.1)
Sodium: 135 mmol/L (ref 135–145)

## 2019-01-02 LAB — CBC WITH DIFFERENTIAL/PLATELET
Abs Immature Granulocytes: 0.13 10*3/uL — ABNORMAL HIGH (ref 0.00–0.07)
Basophils Absolute: 0.1 10*3/uL (ref 0.0–0.1)
Basophils Relative: 1 %
Eosinophils Absolute: 0.2 10*3/uL (ref 0.0–0.5)
Eosinophils Relative: 1 %
HCT: 31.2 % — ABNORMAL LOW (ref 36.0–46.0)
Hemoglobin: 9.9 g/dL — ABNORMAL LOW (ref 12.0–15.0)
Immature Granulocytes: 1 %
Lymphocytes Relative: 12 %
Lymphs Abs: 2 10*3/uL (ref 0.7–4.0)
MCH: 32 pg (ref 26.0–34.0)
MCHC: 31.7 g/dL (ref 30.0–36.0)
MCV: 101 fL — ABNORMAL HIGH (ref 80.0–100.0)
Monocytes Absolute: 1.2 10*3/uL — ABNORMAL HIGH (ref 0.1–1.0)
Monocytes Relative: 8 %
Neutro Abs: 12.8 10*3/uL — ABNORMAL HIGH (ref 1.7–7.7)
Neutrophils Relative %: 77 %
Platelets: 524 10*3/uL — ABNORMAL HIGH (ref 150–400)
RBC: 3.09 MIL/uL — ABNORMAL LOW (ref 3.87–5.11)
RDW: 14.6 % (ref 11.5–15.5)
WBC: 16.4 10*3/uL — ABNORMAL HIGH (ref 4.0–10.5)
nRBC: 0 % (ref 0.0–0.2)

## 2019-01-02 LAB — LACTIC ACID, PLASMA
Lactic Acid, Venous: 1.9 mmol/L (ref 0.5–1.9)
Lactic Acid, Venous: 1.1 mmol/L (ref 0.5–1.9)

## 2019-01-02 LAB — HEPARIN LEVEL (UNFRACTIONATED): Heparin Unfractionated: >2.2 IU/mL — ABNORMAL HIGH (ref 0.30–0.70)

## 2019-01-02 LAB — APTT: aPTT: 60 seconds — ABNORMAL HIGH (ref 24–36)

## 2019-01-02 LAB — GLUCOSE, CAPILLARY: Glucose-Capillary: 172 mg/dL — ABNORMAL HIGH (ref 70–99)

## 2019-01-02 LAB — VANCOMYCIN, RANDOM: Vancomycin Rm: 13

## 2019-01-02 MED ORDER — HYDRALAZINE HCL 25 MG PO TABS: 25.0000 mg | ORAL_TABLET | Freq: Two times a day (BID) | ORAL | Status: DC

## 2019-01-02 MED ORDER — RENA-VITE PO TABS
1.0000 | ORAL_TABLET | Freq: Every day | ORAL | Status: DC
  Administered 2019-01-02 – 2019-01-04 (×3): 1 via ORAL
  Filled 2019-01-02 (×3): qty 1

## 2019-01-02 MED ORDER — ALBUTEROL SULFATE (2.5 MG/3ML) 0.083% IN NEBU: 3.0000 mL | INHALATION_SOLUTION | Freq: Four times a day (QID) | RESPIRATORY_TRACT | Status: DC | PRN

## 2019-01-02 MED ORDER — HEPARIN (PORCINE) 25000 UT/250ML-% IV SOLN
1250.0000 [IU]/h | INTRAVENOUS | Status: DC
  Administered 2019-01-02 – 2019-01-04 (×2): 1250 [IU]/h via INTRAVENOUS
  Filled 2019-01-02 (×3): qty 250

## 2019-01-02 MED ORDER — SODIUM CHLORIDE 0.9 % IV SOLN
INTRAVENOUS | Status: AC
  Administered 2019-01-03: via INTRAVENOUS

## 2019-01-02 MED ORDER — VANCOMYCIN HCL IN DEXTROSE 1-5 GM/200ML-% IV SOLN
1000.0000 mg | INTRAVENOUS | Status: DC
  Filled 2019-01-02: qty 200

## 2019-01-02 MED ORDER — ASPIRIN EC 81 MG PO TBEC
81.0000 mg | DELAYED_RELEASE_TABLET | Freq: Every day | ORAL | Status: DC
  Administered 2019-01-03: 81 mg via ORAL
  Filled 2019-01-02: qty 1

## 2019-01-02 MED ORDER — CALCIUM ACETATE (PHOS BINDER) 667 MG PO CAPS
667.0000 mg | ORAL_CAPSULE | Freq: Two times a day (BID) | ORAL | Status: DC
  Administered 2019-01-03 – 2019-01-04 (×3): 667 mg via ORAL
  Filled 2019-01-02 (×3): qty 1

## 2019-01-02 MED ORDER — METRONIDAZOLE IN NACL 5-0.79 MG/ML-% IV SOLN
500.0000 mg | Freq: Once | INTRAVENOUS | Status: AC
  Administered 2019-01-02: 500 mg via INTRAVENOUS
  Filled 2019-01-02: qty 100

## 2019-01-02 MED ORDER — ALTEPLASE 2 MG IJ SOLR: 2.0000 mg | Freq: Once | INTRAMUSCULAR | Status: DC | PRN

## 2019-01-02 MED ORDER — GABAPENTIN 100 MG PO CAPS
100.0000 mg | ORAL_CAPSULE | Freq: Every day | ORAL | Status: DC
  Administered 2019-01-02 – 2019-01-04 (×3): 100 mg via ORAL
  Filled 2019-01-02 (×3): qty 1

## 2019-01-02 MED ORDER — SODIUM CHLORIDE 0.9 % IV SOLN
2.0000 g | Freq: Once | INTRAVENOUS | Status: AC
  Administered 2019-01-02: 2 g via INTRAVENOUS
  Filled 2019-01-02: qty 2

## 2019-01-02 MED ORDER — VANCOMYCIN HCL IN DEXTROSE 1-5 GM/200ML-% IV SOLN
1000.0000 mg | Freq: Once | INTRAVENOUS | Status: AC
  Administered 2019-01-02: 1000 mg via INTRAVENOUS
  Filled 2019-01-02: qty 200

## 2019-01-02 MED ORDER — PANTOPRAZOLE SODIUM 40 MG PO TBEC
40.0000 mg | DELAYED_RELEASE_TABLET | Freq: Every evening | ORAL | Status: DC
  Administered 2019-01-03 – 2019-01-04 (×2): 40 mg via ORAL
  Filled 2019-01-02 (×2): qty 1

## 2019-01-02 MED ORDER — AMLODIPINE BESYLATE 5 MG PO TABS
5.0000 mg | ORAL_TABLET | Freq: Every evening | ORAL | Status: DC
  Administered 2019-01-03 – 2019-01-04 (×2): 5 mg via ORAL
  Filled 2019-01-02 (×2): qty 1

## 2019-01-02 MED ORDER — CARVEDILOL 25 MG PO TABS
25.0000 mg | ORAL_TABLET | Freq: Two times a day (BID) | ORAL | Status: DC
  Administered 2019-01-03 – 2019-01-05 (×5): 25 mg via ORAL
  Filled 2019-01-02 (×5): qty 1

## 2019-01-02 MED ORDER — ATORVASTATIN CALCIUM 10 MG PO TABS
20.0000 mg | ORAL_TABLET | Freq: Every day | ORAL | Status: DC
  Administered 2019-01-03 – 2019-01-04 (×2): 20 mg via ORAL
  Filled 2019-01-02 (×2): qty 2

## 2019-01-02 MED ORDER — SODIUM CHLORIDE 0.9 % IV SOLN: 2.0000 g | INTRAVENOUS | Status: DC

## 2019-01-02 MED ORDER — ACETAMINOPHEN 500 MG PO TABS
1000.0000 mg | ORAL_TABLET | Freq: Three times a day (TID) | ORAL | Status: DC | PRN
  Administered 2019-01-03 – 2019-01-04 (×3): 1000 mg via ORAL
  Filled 2019-01-02 (×3): qty 2

## 2019-01-02 MED ORDER — POLYETHYLENE GLYCOL 3350 17 G PO PACK: 17.0000 g | PACK | Freq: Every day | ORAL | Status: DC | PRN

## 2019-01-02 MED ORDER — SACCHAROMYCES BOULARDII 250 MG PO CAPS
250.0000 mg | ORAL_CAPSULE | Freq: Two times a day (BID) | ORAL | Status: DC
  Administered 2019-01-02 – 2019-01-04 (×5): 250 mg via ORAL
  Filled 2019-01-02 (×5): qty 1

## 2019-01-02 NOTE — Consult Note (Signed)
Patient name: Kristen Berry MRN: [id number] DOB: [birth year] Sex: female  REASON FOR CONSULT:   Peripheral vascular disease.  The consult is requested by Dr. Sharol Given.  HPI:   Kristen Berry is a pleasant 63 y.o. female who had presented with a gangrenous right toe.  She underwent ray amputation of the right great toe by Dr. Sharol Given.  She had evidence of peripheral vascular disease and underwent an arteriogram by Dr. Trula Slade on 12/27/2018.  She had occlusion of the superficial femoral artery with reconstitution of the popliteal artery.  She underwent successful angioplasty and stenting of the right superficial femoral artery and popliteal artery.  There was single-vessel runoff on the right via the anterior tibial artery.  She is readmitted tonight with some discoloration of the right foot and breakdown of the incision from her right transmetatarsal amputation.  Given her history of peripheral vascular disease Dr. Sharol Given requested that vascular surgery assess her circulation.  Current Facility-Administered Medications  Medication Dose Route Frequency Provider Last Rate Last Dose  . albuterol (PROVENTIL HFA;VENTOLIN HFA) 108 (90 Base) MCG/ACT inhaler 2 puff  2 puff Inhalation Q6H PRN Everrett Coombe, MD      . alteplase (CATHFLO ACTIVASE) injection 2 mg  2 mg Intracatheter Once PRN Everrett Coombe, MD      . Derrill Memo ON 01/03/2019] amLODipine (NORVASC) tablet 5 mg  5 mg Oral QPM Everrett Coombe, MD      . Derrill Memo ON 01/03/2019] aspirin EC tablet 81 mg  81 mg Oral Daily Everrett Coombe, MD      . Derrill Memo ON 01/03/2019] atorvastatin (LIPITOR) tablet 20 mg  20 mg Oral q1800 Everrett Coombe, MD      . Derrill Memo ON 01/03/2019] calcium acetate (PHOSLO) capsule 667 mg  667 mg Oral BID WC Everrett Coombe, MD      . Derrill Memo ON 01/03/2019] carvedilol (COREG) tablet 25 mg  25 mg Oral BID Everrett Coombe, MD      . Derrill Memo ON 01/04/2019] ceFEPIme (MAXIPIME) 2 g in sodium chloride 0.9 % 100 mL IVPB  2 g Intravenous Q T,Th,Sat-1800 Everrett Coombe, MD      .  gabapentin (NEURONTIN) capsule 100 mg  100 mg Oral QHS Everrett Coombe, MD      . hydrALAZINE (APRESOLINE) tablet 25 mg  25 mg Oral BID Everrett Coombe, MD      . multivitamin (RENA-VIT) tablet 1 tablet  1 tablet Oral QHS Everrett Coombe, MD      . Derrill Memo ON 01/03/2019] pantoprazole (PROTONIX) EC tablet 40 mg  40 mg Oral QPM Everrett Coombe, MD      . polyethylene glycol (MIRALAX / GLYCOLAX) packet 17 g  17 g Oral Daily PRN Everrett Coombe, MD      . saccharomyces boulardii (FLORASTOR) capsule 250 mg  250 mg Oral BID Everrett Coombe, MD      . Derrill Memo ON 01/04/2019] vancomycin (VANCOCIN) IVPB 1000 mg/200 mL premix  1,000 mg Intravenous Q T,Th,Sa-HD Everrett Coombe, MD       Current Outpatient Medications  Medication Sig Dispense Refill  . acetaminophen (TYLENOL) 500 MG tablet Take 1,500 mg by mouth daily as needed for mild pain or headache.     . albuterol (PROAIR HFA) 108 (90 Base) MCG/ACT inhaler Inhale 2 puffs into the lungs every 6 (six) hours as needed for wheezing or shortness of breath.    [name] alteplase (CATHFLO ACTIVASE) 2 MG injection 2 mg by Intracatheter route once as needed for open catheter. 1 each   .  amLODipine (NORVASC) 5 MG tablet Take 5 mg by mouth every evening.     [name] apixaban (ELIQUIS) 2.5 MG TABS tablet Take 1 tablet (2.5 mg total) by mouth 2 (two) times daily. 60 tablet   . aspirin EC 81 MG EC tablet Take 1 tablet (81 mg total) by mouth daily. 30 tablet 0  . atorvastatin (LIPITOR) 20 MG tablet Take 1 tablet (20 mg total) by mouth daily at 6 PM. 30 tablet 0  . calcitRIOL (ROCALTROL) 0.25 MCG capsule Take 3 capsules (0.75 mcg total) by mouth Every Tuesday,Thursday,and Saturday with dialysis.    [name] calcium acetate (PHOSLO) 667 MG capsule Take 667 mg by mouth 2 (two) times daily with a meal.     . carvedilol (COREG) 25 MG tablet Take 25 mg by mouth 2 (two) times daily.    . clotrimazole-betamethasone (LOTRISONE) cream Apply 1 application topically daily as needed (rash).   0  . diphenoxylate-atropine  (LOMOTIL) 2.5-0.025 MG tablet Take 1 tablet by mouth every 12 (twelve) hours as needed for diarrhea or loose stools.     . gabapentin (NEURONTIN) 100 MG capsule Take 1 capsule (100 mg total) by mouth at bedtime.    . heparin 1000 unit/mL SOLN injection 1 mL (1,000 Units total) by Dialysis route as needed (in dialysis).    . heparin 1000 unit/mL SOLN injection 1.6 mLs (1,600 Units total) by Dialysis route as needed (in dialysis).    . heparin 1000 unit/mL SOLN injection 3 mLs (3,000 Units total) by Dialysis route as needed (in dialysis).    . hydrALAZINE (APRESOLINE) 25 MG tablet Take 25 mg by mouth 2 (two) times daily.     [name] lidocaine-prilocaine (EMLA) cream Apply 1 application topically daily as needed (dialysis).     . multivitamin (RENA-VIT) TABS tablet Take 1 tablet by mouth at bedtime.  0  . oxyCODONE (OXY IR/ROXICODONE) 5 MG immediate release tablet Take 1 tablet (5 mg total) by mouth every 12 (twelve) hours as needed for moderate pain or severe pain. 30 tablet 0  . pantoprazole (PROTONIX) 40 MG tablet Take 40 mg by mouth every evening.     . pentafluoroprop-tetrafluoroeth (GEBAUERS) AERO Apply 1 application topically as needed (topical anesthesia for hemodialysis).  0  . pentoxifylline (TRENTAL) 400 MG CR tablet Take 1 tablet (400 mg total) by mouth daily with supper.    . polyethylene glycol (MIRALAX / GLYCOLAX) packet Take 17 g by mouth daily as needed for mild constipation. 14 each 0  . saccharomyces boulardii (FLORASTOR) 250 MG capsule Take 1 capsule (250 mg total) by mouth 2 (two) times daily. (Patient taking differently: Take 250 mg by mouth every evening. ) 20 capsule 0  . ticagrelor (BRILINTA) 90 MG TABS tablet Take 90 mg by mouth 2 (two) times daily.     . Vancomycin (VANCOCIN) 750-5 MG/150ML-% SOLN Inject 150 mLs (750 mg total) into the vein Every Tuesday,Thursday,and Saturday with dialysis for 4 days. 4000 mL     REVIEW OF SYSTEMS:  [X]  denotes positive finding, [ ]  denotes  negative finding Vascular    Leg swelling    Cardiac    Chest pain or chest pressure:    Shortness of breath upon exertion:    Short of breath when lying flat:    Irregular heart rhythm:    Constitutional    Fever or chills:     PHYSICAL EXAM:   Vitals:   01/02/19 1800 01/02/19 1815 01/02/19 1830 01/02/19 1845  BP: (!) 111/58  102/63 (!) 129/53 (!) 119/104  Pulse: (!) 101 100 (!) 101 99  Resp: 16 19 12 18   Temp:      TempSrc:      SpO2: 98% 98% 99% 100%  Weight:      Height:        GENERAL: The patient is a well-nourished female, in no acute distress. The vital signs are documented above. CARDIOVASCULAR: There is a regular rate and rhythm. PULMONARY: There is good air exchange bilaterally without wheezing or rales. VASCULAR: She has a palpable right popliteal pulse. She has a brisk anterior tibial signal with the Doppler on the right.  That is her only runoff. EXTREMITIES:     DATA:   ARTERIOGRAM: I have reviewed her arteriogram.  She had an excellent result from her angioplasty and stenting of the right superficial femoral artery and popliteal artery.  She had single-vessel runoff via the anterior tibial artery.  MEDICAL ISSUES:   PERIPHERAL VASCULAR DISEASE WITH NONHEALING WOUND RIGHT FOOT: Based on her exam she has a palpable popliteal pulse and a brisk anterior tibial signal with the Doppler.  I think her circulation is as good as we can get it.  Based on her exam I think her intervention is patent and there really no further options for revascularization.  I will obtain an ABI as she has not had one post procedure.  Deitra Mayo [facility] [phone]

## 2019-01-02 NOTE — H&P (Signed)
[facility] Admission History and Physical Service Pager: [phone]  Patient name: Kristen Berry Medical record number: [id number] Date of birth: January 20, 1956 Age: 63 y.o. Gender: female  Primary Care Provider: [place], Sarah T, MD Consultants: vascular surgery, orthopedic surgery Code Status: DNR  Chief Complaint: worsening discoloration of right foot and cellulitis  Assessment and Plan: ILIANA HUTT is a 63 y.o. female presenting with worsening discoloration of right foot and cellulitis . 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 She has had pain and discomfort since that operation. No fevers. Was noted to have worsening discoloration (blackening) of the foot and toe at her SNF and sent to ED for evaluation. Vitals notable for tachycardia, however patient due for coreg. She was tachypneic to 22 in the computer but was breathing very comfortably on exam. WBC 16.4. Lactic acid 1.9. BP WNL. Previously DC'd on vancomycin dosed with HD.  XR not suggestive of osteomyelitis. - Admit to [facility], attending Dr. Mingo Amber - Orthopedics (Dr. Sharol Given) and Vascular surgery consulted - Vanc per pharm (4/7 - ) - cefepime per pharm (4/7 - ) - monitor fever curve - consider wound care in AM - tylenol 1000 mg TID PRN pain - NPO at MN - hold Eliquis, start heparin gtt for possible surgical intervention - will follow up ortho and vascular recs, appreciate input - gentle fluids overnight while NPO, pt with EF of 35-40%  ESRD on HD TThS - Patient reports last HD was today. - will need to consult nephro in the AM - renally dose medications -Continue PhosLo  Diabetes mellitus type 2- 5.5 on 12/22/2018.    - CBG q4H while NPO - SSI can be added if  needed  MGUS-diagnosed in 2017.  Patient not currently on any medication.  Seen by Dr. Alvy Bimler at [facility].  HFrEF- January 2018 patient had echo that showed LVEF 35 to 40%.  Septal and apical akinesis, moderately dilated LV, mild LVH.  Patient not currently in respiratory distress.  No signs of acute worsening.  CAD- left heart cath in February 2019 showed severe diffuse diabetic coronary disease, 70 to 80% stenosis of proximal LAD 75% obstruction of RCA  - hold brilliinta in the setting of likely surgical intervention  Mild asthma - since childhood. Hasn't used her albuterol in months.  No other medications. -Hold albuterol  HTN. PT takes amlodipine 5mg , Coreg 25mg  BID, hydralazine 25mg  BID  at home.  Normotensive on admission/ soft blood pressures on admission 105/56.  -continue home coreg, norvasc - add back hydral if BPs permit  HLD - pt takes 20mg  lipitor at home. - Continue Lipitor  History of left leg DVT- patient takes Eliquis 2.5 mg BID for hx of DVT in left leg.  - hold eliquis - heparin gtt dosed per pharm in setting of likely surgical intervention  Hx of stroke -patient states she had a stroke May of last year.  Was treated at [facility] in [place].  Does not appear to have any residual deficits.  Patient takes Brilinta 90 mg twice daily.  -Hold Brilinta.  Restart after surgery  FEN/GI: N.p.o. at midnight, mIVF x12h Prophylaxis: heparin gtt   Disposition: Admit to Med Surg  History of Present Illness:  Kristen Berry is a  63 y.o. female presenting with worsening right foot necrosis and right lower extremity cellulitis. Patient was in her usual state of health until the end of last month when she underwent right great toe ray amputation. She has had persistent pain since her operation but denies fevers, nausea, or other signs of systemic illness. She was noted to have worsening darkening of the skin over her right foot at her SNF and  therefore was brought into the emergency department. She has seasonal allergies and asthma but no new cough, congestion, or rhinorrhea. No chest pain. No abdominal pain, nausea, vomiting or diarrhea. She has had constipation.   In the ED the patient was afebrile but did have an elevated WBC, tachypnea, and tachycardia. The patient was started on vanc, cefepime and metronidazole. Dr. Sharol Given with orthopedics was consulted as well as vascular surgery.   Nursing home doctor looked at foot over phone and said she needed to come to ED   Review Of Systems: Per HPI  ROS  Patient Active Problem List   Diagnosis Date Noted   Gangrene of toe of right foot ([place])    Osteomyelitis of toe of right foot ([place]) 12/21/2018   PAD (peripheral artery disease) ([place]) 01/24/2018   Peripheral artery disease ([place]) 01/24/2018   C. difficile diarrhea 01/06/2018   Hypokalemia 01/06/2018   ESRD (end stage renal disease) ([place]) 01/06/2018   Prolonged QT interval 01/06/2018   Hypotension 01/06/2018   Multiple rib fractures 01/06/2018   Pressure injury of skin 11/08/2017   Coronary artery disease due to lipid rich plaque    Chest pain    Acute on chronic systolic CHF (congestive heart failure), NYHA class 3 ([place]) 10/18/2017   PICC (peripherally inserted central catheter) in place    Acute osteomyelitis of toe of left foot ([place])    ARF (acute renal failure) ([place]) 10/10/2017   AKI (acute kidney injury) ([place]) 10/10/2017   Essential hypertension 12/14/2016   Abnormal bone xray 06/15/2016   MGUS (monoclonal gammopathy of unknown significance) 05/18/2016   Stage 4 chronic kidney disease ([place]) 05/18/2016   CHF (congestive heart failure) ([place]) 05/18/2016   Renal insufficiency 06/28/2013   Other and unspecified hyperlipidemia 06/28/2013   Obesity, unspecified 06/28/2013   Pain in limb 06/28/2013   Type 2 diabetes mellitus with neurologic complication, without long-term current use of insulin  ([place]) 06/28/2013    Past Medical History: Past Medical History:  Diagnosis Date   Anemia    Arthritis    "hands, knees" (10/10/2017)   Asthma    CHF (congestive heart failure) (H[facility])    Chronic kidney disease (CKD), stage IV (severe) ([place])    Dialysis T/ Th/ Sat   Coronary artery disease    H/O Clostridium difficile infection    High cholesterol    History of kidney stones    Hypertension    PVD (peripheral vascular disease) ([place])    "LLE; will have OR" (10/10/2017)   Sleep apnea    "never given mask" (10/10/2017)   Stroke ([place])    mild stroke mid April - 2019   Type II diabetes mellitus ([place])    "no RX anymore" (10/10/2017)    Past Surgical History: Past Surgical History:  Procedure Laterality Date   A/V FISTULAGRAM N/A 12/27/2018   Procedure: A/V FISTULAGRAM - Left Upper;  Surgeon: Serafina Mitchell, MD;  Location: [facility];  Service: Cardiovascular;  Laterality: N/A;   AMPUTATION Left 11/10/2017   Procedure: AMPUTATION DIGIT SECOND TOE LEFT FOOT;  Surgeon: Waynetta Sandy, MD;  Location: [place];  Service: Vascular;  Laterality: Left;   AMPUTATION Left 01/24/2018   Procedure: AMPUTATION THIRD TOE LEFT FOOT;  Surgeon: Waynetta Sandy, MD;  Location: [facility];  Service: Vascular;  Laterality: Left;   AMPUTATION Right 12/22/2018   Procedure: Right foot 1st ray amputation;  Surgeon: Newt Minion, MD;  Location: [facility];  Service: Orthopedics;  Laterality: Right;   AORTOGRAM N/A 11/03/2017   Procedure: Ultrasound Guided Cannulation Right Common Femoral Artery;  Aortagram with Left Lower Extremity Arteriogram; Attempted Treatment Left Superficial Femoral Artery; Percutaneous Closure Right Common Femoral Arteriotomy with Proglide Device;  Surgeon: Waynetta Sandy, MD;  Location: [facility];  Service: Vascular;  Laterality: N/A;   AV FISTULA PLACEMENT Left 11/10/2017   Procedure: ARTERIOVENOUS (AV) FISTULA CREATION LEFT UPPER ARM;  Surgeon:  Waynetta Sandy, MD;  Location: [facility];  Service: Vascular;  Laterality: Left;   HERNIA REPAIR  1950s   INSERTION OF DIALYSIS CATHETER Right 11/10/2017   Procedure: INSERTION OF TUNNELED DIALYSIS CATHETER RIGHT INTERNAL JUGULAR PLACEMENT;  Surgeon: Waynetta Sandy, MD;  Location: [facility];  Service: Vascular;  Laterality: Right;   IR FLUORO GUIDE CV LINE RIGHT  10/19/2017   IR US GUIDE VASC ACCESS RIGHT  10/19/2017   LEFT HEART CATH AND CORONARY ANGIOGRAPHY N/A 11/01/2017   Procedure: LEFT HEART CATH AND CORONARY ANGIOGRAPHY;  Surgeon: Belva Crome, MD;  Location: [facility];  Service: Cardiovascular;  Laterality: N/A;   LOWER EXTREMITY ANGIOGRAPHY N/A 12/27/2018   Procedure: LOWER EXTREMITY ANGIOGRAPHY - Right Lower;  Surgeon: Serafina Mitchell, MD;  Location: [facility];  Service: Cardiovascular;  Laterality: N/A;   PERIPHERAL VASCULAR INTERVENTION Left 11/07/2017   Procedure: PERIPHERAL VASCULAR INTERVENTION;  Surgeon: Waynetta Sandy, MD;  Location: [facility];  Service: Cardiovascular;  Laterality: Left;  left SFA   PERIPHERAL VASCULAR INTERVENTION Right 12/27/2018   Procedure: PERIPHERAL VASCULAR INTERVENTION;  Surgeon: Serafina Mitchell, MD;  Location: [facility];  Service: Cardiovascular;  Laterality: Right;  SFA   SHOULDER OPEN ROTATOR CUFF REPAIR Left    TUBAL LIGATION     ULTRASOUND GUIDANCE FOR VASCULAR ACCESS  11/01/2017   Procedure: Ultrasound Guidance For Vascular Access;  Surgeon: Belva Crome, MD;  Location: [facility];  Service: Cardiovascular;;    Social History: Social History   Tobacco Use   Smoking status: Never Smoker   Smokeless tobacco: Never Used  Substance Use Topics   Alcohol use: No   Drug use: No   Additional social history:   Please also refer to relevant sections of EMR.  Family History: Family History  Problem Relation Age of Onset   Hypertension Father    Heart failure  Maternal Grandmother    Heart failure Maternal Grandfather    (If not completed, MUST add something in)  Allergies and Medications: Allergies  Allergen Reactions   Crestor [Rosuvastatin Calcium] Other (See Comments)    Leg pain   No current facility-administered medications on file prior to encounter.    Current Outpatient Medications on File Prior to Encounter  Medication Sig Dispense Refill   acetaminophen (TYLENOL) 500 MG tablet Take 1,500 mg by mouth daily as needed for mild pain or headache.      albuterol (PROAIR HFA) 108 (90 Base) MCG/ACT inhaler Inhale 2 puffs into the lungs every 6 (six) hours as needed for wheezing or shortness of breath.  alteplase (CATHFLO ACTIVASE) 2 MG injection 2 mg by Intracatheter route once as needed for open catheter. 1 each    amLODipine (NORVASC) 5 MG tablet Take 5 mg by mouth every evening.      apixaban (ELIQUIS) 2.5 MG TABS tablet Take 1 tablet (2.5 mg total) by mouth 2 (two) times daily. 60 tablet    aspirin EC 81 MG EC tablet Take 1 tablet (81 mg total) by mouth daily. 30 tablet 0   atorvastatin (LIPITOR) 20 MG tablet Take 1 tablet (20 mg total) by mouth daily at 6 PM. 30 tablet 0   calcitRIOL (ROCALTROL) 0.25 MCG capsule Take 3 capsules (0.75 mcg total) by mouth Every Tuesday,Thursday,and Saturday with dialysis.     calcium acetate (PHOSLO) 667 MG capsule Take 667 mg by mouth 2 (two) times daily with a meal.      carvedilol (COREG) 25 MG tablet Take 25 mg by mouth 2 (two) times daily.     clotrimazole-betamethasone (LOTRISONE) cream Apply 1 application topically daily as needed (rash).   0   diphenoxylate-atropine (LOMOTIL) 2.5-0.025 MG tablet Take 1 tablet by mouth every 12 (twelve) hours as needed for diarrhea or loose stools.      gabapentin (NEURONTIN) 100 MG capsule Take 1 capsule (100 mg total) by mouth at bedtime.     heparin 1000 unit/mL SOLN injection 1 mL (1,000 Units total) by Dialysis route as needed (in dialysis).      heparin 1000 unit/mL SOLN injection 1.6 mLs (1,600 Units total) by Dialysis route as needed (in dialysis).     heparin 1000 unit/mL SOLN injection 3 mLs (3,000 Units total) by Dialysis route as needed (in dialysis).     hydrALAZINE (APRESOLINE) 25 MG tablet Take 25 mg by mouth 2 (two) times daily.      lidocaine-prilocaine (EMLA) cream Apply 1 application topically daily as needed (dialysis).      multivitamin (RENA-VIT) TABS tablet Take 1 tablet by mouth at bedtime.  0   oxyCODONE (OXY IR/ROXICODONE) 5 MG immediate release tablet Take 1 tablet (5 mg total) by mouth every 12 (twelve) hours as needed for moderate pain or severe pain. 30 tablet 0   pantoprazole (PROTONIX) 40 MG tablet Take 40 mg by mouth every evening.      pentafluoroprop-tetrafluoroeth (GEBAUERS) AERO Apply 1 application topically as needed (topical anesthesia for hemodialysis).  0   pentoxifylline (TRENTAL) 400 MG CR tablet Take 1 tablet (400 mg total) by mouth daily with supper.     polyethylene glycol (MIRALAX / GLYCOLAX) packet Take 17 g by mouth daily as needed for mild constipation. 14 each 0   saccharomyces boulardii (FLORASTOR) 250 MG capsule Take 1 capsule (250 mg total) by mouth 2 (two) times daily. (Patient taking differently: Take 250 mg by mouth every evening. ) 20 capsule 0   ticagrelor (BRILINTA) 90 MG TABS tablet Take 90 mg by mouth 2 (two) times daily.      Vancomycin (VANCOCIN) 750-5 MG/150ML-% SOLN Inject 150 mLs (750 mg total) into the vein Every Tuesday,Thursday,and Saturday with dialysis for 4 days. 4000 mL     Objective: BP 124/60    Pulse (!) 104    Temp 98 F (36.7 C) (Oral)    Resp (!) 22    Ht 5\' 8"  (1.727 m)    Wt 84.3 kg    SpO2 100%    BMI 28.26 kg/m  Exam: General: NAD, rests comfortably in bed in NAD Eyes: EOMI, no conjunctival pallor or injection  ENTM: no rhinorrhea or congestion, MMM Neck: supple Cardiovascular: RRR, no m/r/g Respiratory: CTA bil, no  W/R/R Gastrointestinal: soft, nt, nd MSK: well-healing surgical wounds with multiple toes amputated on the left. +necrotic-appearing wound noted on the right foot extending down to the toe. Redness noted extending about 1/3 up right shin.   Derm: no other rashes noted Neuro: CN II-XIXI intact Psych: AAOx3, cooperative and pleasant Media Information   Document Information   Photos    01/02/2019 18:29  Attached To:  Hospital Encounter on 01/02/19  Source Information   Everrett Coombe, MD   [facility]    Labs and Imaging: CBC BMET  Recent Labs  Lab 01/02/19 1641  WBC 16.4*  HGB 9.9*  HCT 31.2*  PLT 524*   Recent Labs  Lab 01/02/19 1641  NA 135  K 3.7  CL 96*  CO2 24  BUN 29*  CREATININE 3.45*  GLUCOSE 120*  CALCIUM 9.4     Dg Foot Complete Right  Result Date: 01/02/2019 CLINICAL DATA:  Redness and swelling of the medial aspect of the right foot. 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 EXAM: RIGHT FOOT COMPLETE - 3+ VIEW COMPARISON:  Radiographs dated 12/21/2018 FINDINGS: There is edema and a small amount of gas in the soft tissues at the site of the previous resection of the first metatarsal. No evidence of osteomyelitis of the remaining bones. Slight arthritis between navicular and medial cuneiform, stable. Chronic calcaneal enthesophytes. Chronic dorsal spurring in the midfoot. IMPRESSION: 1. No evidence of osteomyelitis. 2. Edema and small amount of gas in the soft tissues at the site of the previous resection of the first metatarsal and great toe. This could be residual postsurgical edema and air. Infection is not excluded. Electronically Signed   By: Lorriane Shire M.D.   On: 01/02/2019 16:20     Everrett Coombe, MD 01/02/2019, 6:22 PM PGY-3, [place] Intern pager: [phone], text pages welcome

## 2019-01-02 NOTE — ED Triage Notes (Signed)
Pt brought in by [facility] from [facility] in [place]. Per EMS pt was discharged from [facility] x2 days ago following a right toe amputation. Per nursing facility, pt foot is black and red up to her ankle. Pt A+Ox4, denies fever, chills. Pt states she receives dialysis Tues Thurs Sat.

## 2019-01-02 NOTE — Progress Notes (Addendum)
ANTICOAGULATION CONSULT NOTE - Initial Consult  Pharmacy Consult for heparin Indication: bridge for h/o VTE for surgery  Allergies  Allergen Reactions  . Crestor [Rosuvastatin Calcium] Other (See Comments)    Leg pain    Patient Measurements: Height: 5\' 8"  (172.7 cm) Weight: 185 lb 13.6 oz (84.3 kg) IBW/kg (Calculated) : 63.9 Heparin Dosing Weight: 81kg  Vital Signs: Temp: 98.1 F (36.7 C) (04/07 2008) Temp Source: Oral (04/07 2008) BP: 105/56 (04/07 2008) Pulse Rate: 98 (04/07 2008)  Labs: Recent Labs    12/31/18 0440 01/02/19 1641  HGB 10.5* 9.9*  HCT 33.1* 31.2*  PLT 450* 524*  CREATININE 3.55* 3.45*    Estimated Creatinine Clearance: 19 mL/min (A) (by C-G formula based on SCr of 3.45 mg/dL (H)).   Medical History: Past Medical History:  Diagnosis Date  . Anemia   . Arthritis    "hands, knees" (10/10/2017)  . Asthma   . CHF (congestive heart failure) ([place])   . Chronic kidney disease (CKD), stage IV (severe) ([place])    Dialysis T/ Th/ Sat  . Coronary artery disease   . H/O Clostridium difficile infection   . High cholesterol   . History of kidney stones   . Hypertension   . PVD (peripheral vascular disease) ([place])    "LLE; will have OR" (10/10/2017)  . Sleep apnea    "never given mask" (10/10/2017)  . Stroke [facility])    mild stroke mid April - 2019  . 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 Patient also with h/o ESRD on HD. Admitted for worsening right foot wound. Patient to have procedure and MD would like apixaban held and heparin drip started.   Patient on apixaban 2.5mg  BID PTA, however patient unsure of last dose but thinks it might have been this AM. No bleeding currently noted. Hgb 9.9, PLT 524.  Will use APTT initially to monitor therapy due to recent DOAC use. Once APTT and heparin levels correlate then will use heparin levels.    Goal of Therapy:  APTT 66-102  sec Heparin level 0.3-0.7 units/ml Monitor platelets by anticoagulation protocol: Yes   Plan:  APTT and heparin level for baseline Heparin drip at 1250 units/hr starting at 2200 APTT in 6 hours with AM labs Daily Heparin level Monitor for bleeding  Vinaya Sancho A. Levada Dy, PharmD, [place] Please utilize Amion for appropriate phone number to reach the unit pharmacist ([place])   Addendum:   Baseline PTT 60 sec, goal is 66-102 sec  01/02/2019,8:13 PM

## 2019-01-02 NOTE — ED Notes (Signed)
Patient transported to X-ray 

## 2019-01-02 NOTE — Progress Notes (Signed)
This nurse spoke with infectious disease, Dr Tammi Klippel, and a Dialysis NP regarding patient isolation. At this time this nurse was instructed by Loralie Champagne to put patient on isolation for COVID19 rule out droplet precautions and monitor patient closely if symptoms arise.

## 2019-01-02 NOTE — ED Provider Notes (Signed)
[facility] EMERGENCY DEPARTMENT Provider Note   CSN: [id number] Arrival date & time: 01/02/19  1543    History   Chief Complaint Chief Complaint  Patient presents with  . Post-op Problem    HPI Kristen Berry is a 63 y.o. female.     Recent right big toe amputation and stent placement to RLE by ortho and vascular surgery. On antibiotics but right foot with worsening redness, black and concern for further failure of therapy.  The history is provided by the patient.  Foot Pain  This is a recurrent problem. The current episode started yesterday. The problem occurs constantly. The problem has not changed since onset.Pertinent negatives include no chest pain, no abdominal pain, no headaches and no shortness of breath. Nothing aggravates the symptoms. Nothing relieves the symptoms. She has tried nothing for the symptoms. The treatment provided no relief.    Past Medical History:  Diagnosis Date  . Anemia   . Arthritis    "hands, knees" (10/10/2017)  . Asthma   . CHF (congestive heart failure) ([place])   . Chronic kidney disease (CKD), stage IV (severe) ([place])    Dialysis T/ Th/ Sat  . Coronary artery disease   . H/O Clostridium difficile infection   . High cholesterol   . History of kidney stones   . Hypertension   . PVD (peripheral vascular disease) ([place])    "LLE; will have OR" (10/10/2017)  . Sleep apnea    "never given mask" (10/10/2017)  . Stroke [facility])    mild stroke mid April - 2019  . Type II diabetes mellitus ([place])    "no RX anymore" (10/10/2017)    Patient Active Problem List   Diagnosis Date Noted  . Gangrene of toe of right foot ([place])   . Osteomyelitis of toe of right foot ([place]) 12/21/2018  . PAD (peripheral artery disease) ([place]) 01/24/2018  . Peripheral artery disease ([place]) 01/24/2018  . C. difficile diarrhea 01/06/2018  . Hypokalemia 01/06/2018  . ESRD (end stage renal disease) ([place]) 01/06/2018  . Prolonged QT interval 01/06/2018  .  Hypotension 01/06/2018  . Multiple rib fractures 01/06/2018  . Pressure injury of skin 11/08/2017  . Coronary artery disease due to lipid rich plaque   . Chest pain   . Acute on chronic systolic CHF (congestive heart failure), NYHA class 3 ([place]) 10/18/2017  . PICC (peripherally inserted central catheter) in place   . Acute osteomyelitis of toe of left foot ([place])   . ARF (acute renal failure) ([place]) 10/10/2017  . AKI (acute kidney injury) ([place]) 10/10/2017  . Essential hypertension 12/14/2016  . Abnormal bone xray 06/15/2016  . MGUS (monoclonal gammopathy of unknown significance) 05/18/2016  . Stage 4 chronic kidney disease ([place]) 05/18/2016  . CHF (congestive heart failure) ([place]) 05/18/2016  . Renal insufficiency 06/28/2013  . Other and unspecified hyperlipidemia 06/28/2013  . Obesity, unspecified 06/28/2013  . Pain in limb 06/28/2013  . Type 2 diabetes mellitus with neurologic complication, without long-term current use of insulin ([place]) 06/28/2013    Past Surgical History:  Procedure Laterality Date  . A/V FISTULAGRAM N/A 12/27/2018   Procedure: A/V FISTULAGRAM - Left Upper;  Surgeon: Serafina Mitchell, MD;  Location: [place] [facility];  Service: Cardiovascular;  Laterality: N/A;  . AMPUTATION Left 11/10/2017   Procedure: AMPUTATION DIGIT SECOND TOE LEFT FOOT;  Surgeon: Waynetta Sandy, MD;  Location: [place];  Service: Vascular;  Laterality: Left;  . AMPUTATION Left 01/24/2018   Procedure:  AMPUTATION THIRD TOE LEFT FOOT;  Surgeon: Waynetta Sandy, MD;  Location: [place];  Service: Vascular;  Laterality: Left;  . AMPUTATION Right 12/22/2018   Procedure: Right foot 1st ray amputation;  Surgeon: Newt Minion, MD;  Location: [facility];  Service: Orthopedics;  Laterality: Right;  . AORTOGRAM N/A 11/03/2017   Procedure: Ultrasound Guided Cannulation Right Common Femoral Artery;  Aortagram with Left Lower Extremity Arteriogram; Attempted Treatment Left Superficial Femoral Artery;  Percutaneous Closure Right Common Femoral Arteriotomy with Proglide Device;  Surgeon: Waynetta Sandy, MD;  Location: [facility] OR;  Service: Vascular;  Laterality: N/A;  . AV FISTULA PLACEMENT Left 11/10/2017   Procedure: ARTERIOVENOUS (AV) FISTULA CREATION LEFT UPPER ARM;  Surgeon: Waynetta Sandy, MD;  Location: [place];  Service: Vascular;  Laterality: Left;  . HERNIA REPAIR  1950s  . INSERTION OF DIALYSIS CATHETER Right 11/10/2017   Procedure: INSERTION OF TUNNELED DIALYSIS CATHETER RIGHT INTERNAL JUGULAR PLACEMENT;  Surgeon: Waynetta Sandy, MD;  Location: [place];  Service: Vascular;  Laterality: Right;  . IR FLUORO GUIDE CV LINE RIGHT  10/19/2017  . IR US GUIDE VASC ACCESS RIGHT  10/19/2017  . LEFT HEART CATH AND CORONARY ANGIOGRAPHY N/A 11/01/2017   Procedure: LEFT HEART CATH AND CORONARY ANGIOGRAPHY;  Surgeon: Belva Crome, MD;  Location: [place] CV [facility];  Service: Cardiovascular;  Laterality: N/A;  . LOWER EXTREMITY ANGIOGRAPHY N/A 12/27/2018   Procedure: LOWER EXTREMITY ANGIOGRAPHY - Right Lower;  Surgeon: Serafina Mitchell, MD;  Location: [place] [facility];  Service: Cardiovascular;  Laterality: N/A;  . PERIPHERAL VASCULAR INTERVENTION Left 11/07/2017   Procedure: PERIPHERAL VASCULAR INTERVENTION;  Surgeon: Waynetta Sandy, MD;  Location: [place] [facility];  Service: Cardiovascular;  Laterality: Left;  left SFA  . PERIPHERAL VASCULAR INTERVENTION Right 12/27/2018   Procedure: PERIPHERAL VASCULAR INTERVENTION;  Surgeon: Serafina Mitchell, MD;  Location: [place] [facility];  Service: Cardiovascular;  Laterality: Right;  SFA  . SHOULDER OPEN ROTATOR CUFF REPAIR Left   . TUBAL LIGATION    . ULTRASOUND GUIDANCE FOR VASCULAR ACCESS  11/01/2017   Procedure: Ultrasound Guidance For Vascular Access;  Surgeon: Belva Crome, MD;  Location: [place] [facility];  Service: Cardiovascular;;     OB History   No obstetric history on file.      Home Medications    Prior  to Admission medications   Medication Sig Start Date End Date Taking? Authorizing Provider  acetaminophen (TYLENOL) 500 MG tablet Take 1,500 mg by mouth daily as needed for mild pain or headache.     [provider]  albuterol (PROAIR HFA) 108 (90 Base) MCG/ACT inhaler Inhale 2 puffs into the lungs every 6 (six) hours as needed for wheezing or shortness of breath.    [provider]  alteplase (CATHFLO ACTIVASE) 2 MG injection 2 mg by Intracatheter route once as needed for open catheter. 12/30/18   Bonnita Hollow, MD  amLODipine (NORVASC) 5 MG tablet Take 5 mg by mouth every evening.     [provider]  apixaban (ELIQUIS) 2.5 MG TABS tablet Take 1 tablet (2.5 mg total) by mouth 2 (two) times daily. 12/30/18   Bonnita Hollow, MD  aspirin EC 81 MG EC tablet Take 1 tablet (81 mg total) by mouth daily. 12/30/18   Bonnita Hollow, MD  atorvastatin (LIPITOR) 20 MG tablet Take 1 tablet (20 mg total) by mouth daily at 6 PM. 11/16/17   Domenic Polite, MD  calcitRIOL (ROCALTROL) 0.25 MCG capsule Take 3 capsules (0.75 mcg total) by mouth Every Tuesday,Thursday,and Saturday with dialysis. 12/30/18   Bonnita Hollow, MD  calcium acetate (PHOSLO) 667 MG capsule Take 667 mg by mouth 2 (two) times daily with a meal.  10/27/18   [provider]  carvedilol (COREG) 25 MG tablet Take 25 mg by mouth 2 (two) times daily. 08/26/18   [provider]  clotrimazole-betamethasone (LOTRISONE) cream Apply 1 application topically daily as needed (rash).  01/19/18   [provider]  diphenoxylate-atropine (LOMOTIL) 2.5-0.025 MG tablet Take 1 tablet by mouth every 12 (twelve) hours as needed for diarrhea or loose stools.     [provider]  gabapentin (NEURONTIN) 100 MG capsule Take 1 capsule (100 mg total) by mouth at bedtime. 12/30/18   Bonnita Hollow, MD  heparin 1000 unit/mL SOLN injection 1 mL (1,000 Units total) by Dialysis route as needed (in dialysis). 12/30/18    Bonnita Hollow, MD  heparin 1000 unit/mL SOLN injection 1.6 mLs (1,600 Units total) by Dialysis route as needed (in dialysis). 12/30/18   Bonnita Hollow, MD  heparin 1000 unit/mL SOLN injection 3 mLs (3,000 Units total) by Dialysis route as needed (in dialysis). 12/31/18   Bonnita Hollow, MD  hydrALAZINE (APRESOLINE) 25 MG tablet Take 25 mg by mouth 2 (two) times daily.  01/25/18   Rhyne, Hulen Shouts, PA-C  lidocaine-prilocaine (EMLA) cream Apply 1 application topically daily as needed (dialysis).  09/28/18   [provider]  multivitamin (RENA-VIT) TABS tablet Take 1 tablet by mouth at bedtime. 12/30/18   Bonnita Hollow, MD  oxyCODONE (OXY IR/ROXICODONE) 5 MG immediate release tablet Take 1 tablet (5 mg total) by mouth every 12 (twelve) hours as needed for moderate pain or severe pain. 12/30/18   Bonnita Hollow, MD  pantoprazole (PROTONIX) 40 MG tablet Take 40 mg by mouth every evening.     [provider]  pentafluoroprop-tetrafluoroeth Landry Dyke) AERO Apply 1 application topically as needed (topical anesthesia for hemodialysis). 12/30/18   Bonnita Hollow, MD  pentoxifylline (TRENTAL) 400 MG CR tablet Take 1 tablet (400 mg total) by mouth daily with supper. 12/30/18   Bonnita Hollow, MD  polyethylene glycol [facility] / Floria Raveling) packet Take 17 g by mouth daily as needed for mild constipation. 12/30/18   Bonnita Hollow, MD  saccharomyces boulardii (FLORASTOR) 250 MG capsule Take 1 capsule (250 mg total) by mouth 2 (two) times daily. Patient taking differently: Take 250 mg by mouth every evening.  01/12/18   Caren Griffins, MD  ticagrelor (BRILINTA) 90 MG TABS tablet Take 90 mg by mouth 2 (two) times daily.     [provider]  Vancomycin (VANCOCIN) 750-5 MG/150ML-% SOLN Inject 150 mLs (750 mg total) into the vein Every Tuesday,Thursday,and Saturday with dialysis for 4 days. 12/30/18 01/03/19  Bonnita Hollow, MD    Family History Family History  Problem Relation  Age of Onset  . Hypertension Father   . Heart failure Maternal Grandmother   . Heart failure Maternal Grandfather     Social History Social History   Tobacco Use  . Smoking status: Never Smoker  . Smokeless tobacco: Never Used  Substance Use Topics  . Alcohol use: No  . Drug use: No     Allergies   Crestor [rosuvastatin calcium]   Review of Systems Review of Systems  Constitutional: Negative for chills and fever.  HENT: Negative for ear pain and sore  throat.   Eyes: Negative for pain and visual disturbance.  Respiratory: Negative for cough and shortness of breath.   Cardiovascular: Positive for leg swelling. Negative for chest pain and palpitations.  Gastrointestinal: Negative for abdominal pain and vomiting.  Genitourinary: Negative for dysuria and hematuria.  Musculoskeletal: Positive for gait problem. Negative for arthralgias and back pain.  Skin: Positive for color change, rash and wound.  Neurological: Negative for seizures, syncope and headaches.  All other systems reviewed and are negative.    Physical Exam Updated Vital Signs  ED Triage Vitals  Enc Vitals Group     BP 01/02/19 1548 (!) 125/51     Pulse Rate 01/02/19 1548 77     Resp 01/02/19 1548 17     Temp 01/02/19 1548 98 F (36.7 C)     Temp Source 01/02/19 1548 Oral     SpO2 01/02/19 1548 99 %     Weight 01/02/19 1606 185 lb 13.6 oz (84.3 kg)     Height 01/02/19 1606 5\' 8"  (1.727 m)     Head Circumference --      Peak Flow --      Pain Score --      Pain Loc --      Pain Edu? --      Excl. in [place]? --     Physical Exam Vitals signs and nursing note reviewed.  Constitutional:      General: She is not in acute distress.    Appearance: She is well-developed.  HENT:     Head: Normocephalic and atraumatic.     Mouth/Throat:     Mouth: Mucous membranes are moist.  Eyes:     Conjunctiva/sclera: Conjunctivae normal.     Pupils: Pupils are equal, round, and reactive to light.  Neck:      Musculoskeletal: Neck supple.  Cardiovascular:     Rate and Rhythm: Normal rate and regular rhythm.     Pulses: Normal pulses.     Heart sounds: Normal heart sounds. No murmur.  Pulmonary:     Effort: Pulmonary effort is normal. No respiratory distress.     Breath sounds: Normal breath sounds.  Abdominal:     Palpations: Abdomen is soft.     Tenderness: There is no abdominal tenderness.  Musculoskeletal:        General: Swelling and tenderness (around right foot) present.     Right lower leg: Edema present.     Left lower leg: No edema.  Skin:    Findings: Bruising and erythema present.     Comments: Right foot surgical site black/bruised, concerning for necrosis with surrounding redness  Neurological:     General: No focal deficit present.     Mental Status: She is alert.  Psychiatric:        Mood and Affect: Mood normal.      ED Treatments / Results  Labs (all labs ordered are listed, but only abnormal results are displayed) Labs Reviewed  CBC WITH DIFFERENTIAL/PLATELET - Abnormal; Notable for the following components:      Result Value   WBC 16.4 (*)    RBC 3.09 (*)    Hemoglobin 9.9 (*)    HCT 31.2 (*)    MCV 101.0 (*)    Platelets 524 (*)    Neutro Abs 12.8 (*)    Monocytes Absolute 1.2 (*)    Abs Immature Granulocytes 0.13 (*)    All other components within normal limits  BASIC METABOLIC PANEL - Abnormal;  Notable for the following components:   Chloride 96 (*)    Glucose, Bld 120 (*)    BUN 29 (*)    Creatinine, Ser 3.45 (*)    GFR calc non Af Amer 13 (*)    GFR calc Af Amer 16 (*)    All other components within normal limits  CULTURE, BLOOD (ROUTINE X 2)  CULTURE, BLOOD (ROUTINE X 2)  LACTIC ACID, PLASMA  VANCOMYCIN, RANDOM  LACTIC ACID, PLASMA    EKG None  Radiology Dg Foot Complete Right  Result Date: 01/02/2019 CLINICAL DATA:  Redness and swelling of the medial aspect of the right foot. 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  EXAM: RIGHT FOOT COMPLETE - 3+ VIEW COMPARISON:  Radiographs dated 12/21/2018 FINDINGS: There is edema and a small amount of gas in the soft tissues at the site of the previous resection of the first metatarsal. No evidence of osteomyelitis of the remaining bones. Slight arthritis between navicular and medial cuneiform, stable. Chronic calcaneal enthesophytes. Chronic dorsal spurring in the midfoot. IMPRESSION: 1. No evidence of osteomyelitis. 2. Edema and small amount of gas in the soft tissues at the site of the previous resection of the first metatarsal and great toe. This could be residual postsurgical edema and air. Infection is not excluded. Electronically Signed   By: Lorriane Shire M.D.   On: 01/02/2019 16:20    Procedures .Critical Care Performed by: Lennice Sites, DO Authorized by: Lennice Sites, DO   Critical care provider statement:    Critical care time (minutes):  35   Critical care was necessary to treat or prevent imminent or life-threatening deterioration of the following conditions:  Sepsis and circulatory failure   Critical care was time spent personally by me on the following activities:  Blood draw for specimens, development of treatment plan with patient or surrogate, discussions with consultants, discussions with primary provider, evaluation of patient's response to treatment, examination of patient, obtaining history from patient or surrogate, ordering and performing treatments and interventions, ordering and review of laboratory studies, ordering and review of radiographic studies, pulse oximetry, re-evaluation of patient's condition and review of old charts   I assumed direction of critical care for this patient from another provider in my specialty: no     (including critical care time)  Medications Ordered in ED Medications  vancomycin (VANCOCIN) IVPB 1000 mg/200 mL premix (has no administration in time range)  ceFEPIme (MAXIPIME) 2 g in sodium chloride 0.9 % 100 mL  IVPB (has no administration in time range)  metroNIDAZOLE (FLAGYL) IVPB 500 mg (500 mg Intravenous New Bag/Given 01/02/19 1647)  ceFEPIme (MAXIPIME) 2 g in sodium chloride 0.9 % 100 mL IVPB (0 g Intravenous Stopped 01/02/19 1725)     Initial Impression / Assessment and Plan / ED Course  I have reviewed the triage vital signs and the nursing notes.  Pertinent labs & imaging results that were available during my care of the patient were reviewed by me and considered in my medical decision making (see chart for details).     Arilyn Mamie Nick Nobbe is a 63 year old female with history of peripheral vascular disease, diabetes, end-stage renal disease on hemodialysis status post recent right big toe amputation and stenting of right popliteal artery who presents to the ED with worsening right foot infection.  Patient with surrounding blackness and redness around right foot surgical site.  Concern by nursing home for worsening infection. Patient has good palpable pulses in the right lower extremity however  appears to have necrosis of the skin at surgical site with surrounding redness and erythema concerning for ongoing infection.  Patient leukocytosis 16.4.  X-ray did not show any obvious osteomyelitis but did show some subcutaneous gas which could be from recent surgery or from infection.  Patient was started empirically on IV vancomycin, IV cefepime, IV Flagyl.  Patient has been on vancomycin outpatient that she gets at dialysis.  Contacted both orthopedics and vascular surgery to make them aware that the patient is here and will be admitted to family medicine for further care but will need likely further operative management.  Dr. Sharol Given with orthopedics will evaluate the patient.  Awaiting phone call from vascular surgery to make them aware that they will likely need to be assisted Dr. Sharol Given assuming that patient will need more surgery.  Patient was hemodynamically stable throughout my care.  Concern for sepsis from foot  infection given leukocytosis, infection of the right lower leg.  Blood cultures have been collected.  Fluids were held due to normal vital signs and history of end-stage renal disease.  This chart was dictated using voice recognition software.  Despite best efforts to proofread,  errors can occur which can change the documentation meaning.    Final Clinical Impressions(s) / ED Diagnoses   Final diagnoses:  Right foot infection    ED Discharge Orders    None       Lennice Sites, DO 01/02/19 1733

## 2019-01-02 NOTE — Progress Notes (Signed)
Received call from Dialysis nurse who explained patient was exposed to [place] by a tech here who tested positive for COVID19 at [facility] upon her last visit here. Patient staying at [facility] in [place] and was tested for COVID on 12/27/18 which came back negative. Patient then placed on 14 day isolation in rehab facility for [place]. Currently nonsymptomatic pgd Dr on call awaiting call back. Also informed by Dialysis nurse patient has history of bedbugs.

## 2019-01-02 NOTE — Progress Notes (Signed)
Spoke to Infection Prevention specialist, Tamsen Meek, who called back to inform us that patient should actually be on droplet/contact precautions. Patient will only need COVID testing if becomes symptomatic. Patients initial exposure was on 3/31. If asymptomatic by 4/14 can discontinue precautions.   Spoke to RN caring for patient. Per RN patient unable to be on 6N given droplet/contact precautions.   Spoke to charge nurse Junie Panning ([place]) who informed that [facility] [place] is only taking actively tested patients.   Spoke to [facility] who stated he will discuss with 6N and determine which floor patient needs to go to.   Have ordered droplet/contact precautions for patient.   Dalphine Handing, PGY-2 [facility] 01/02/2019 11:10 PM

## 2019-01-02 NOTE — Progress Notes (Signed)
Pharmacy Antibiotic Note  Kristen Berry is a 63 y.o. female admitted on 01/02/2019 with wound infection.  Pharmacy has been consulted for vancomycin and cefepime dosing. Pt is afebrile but WBC is elevated at 14.1. Pt is already on vancomycin and was to complete her course tomorrow. Pt went to HD off-scheduled yesterday. A random vancomycin level today is low at 13.   Plan: Vancomycin 1gm IV x 1 now and post-HD Cefepime 2gm IV x 1 then post-HD F/u renal plans, C&S, clinical status and pre-HD vanc level when appropriate  Height: 5\' 8"  (172.7 cm) Weight: 185 lb 13.6 oz (84.3 kg) IBW/kg (Calculated) : 63.9  Temp (24hrs), Avg:98 F (36.7 C), Min:98 F (36.7 C), Max:98 F (36.7 C)  Recent Labs  Lab 12/27/18 1537 12/28/18 0318 12/29/18 0226 12/30/18 0235 12/31/18 0440  WBC 12.4* 13.0* 14.4* 14.3* 14.1*  CREATININE 3.96* 4.46* 3.24* 4.73* 3.55*  VANCORANDOM  --   --  22  --   --     Estimated Creatinine Clearance: 18.5 mL/min (A) (by C-G formula based on SCr of 3.55 mg/dL (H)).    Allergies  Allergen Reactions  . Crestor [Rosuvastatin Calcium] Other (See Comments)    Leg pain    Antimicrobials this admission: Vanc 3/19>> Cefepime 4/7>> Flagyl x 1 4/7  Dose adjustments this admission: N/A  Microbiology results: Pending  Thank you for allowing pharmacy to be a part of this patient's care.  Cathern Tahir, Rande Lawman 01/02/2019 4:24 PM

## 2019-01-02 NOTE — ED Notes (Signed)
ED TO INPATIENT HANDOFF REPORT  ED Nurse Name and Phone #: Petra Kuba [phone]  S Name/Age/Gender Kristen Berry 63 y.o. female Room/Bed: 020C/020C  Code Status   Code Status: DNR  Home/SNF/Other Skilled nursing facility Patient oriented to: self, place, time and situation Is this baseline? Yes   Triage Complete: Triage complete  Chief Complaint foot infection  Triage Note Pt brought in by [place] from [facility] in [place]. Per EMS pt was discharged from [facility] x2 days ago following a right toe amputation. Per nursing facility, pt foot is black and red up to her ankle. Pt A+Ox4, denies fever, chills. Pt states she receives dialysis Tues Thurs Sat.    Allergies Allergies  Allergen Reactions  . Crestor [Rosuvastatin Calcium] Other (See Comments)    Leg pain    Level of Care/Admitting Diagnosis ED Disposition    ED Disposition Condition [facility] Area: [place] [[id number]]  Level of Care: Med-Surg [16]  Diagnosis: Cellulitis [[id number]]  Admitting Physician: Everrett Coombe [[id number]]  Attending Physician: Mingo Amber, JEFFREY H [[id number]]  Estimated length of stay: past midnight tomorrow  Certification:: I certify this patient will need inpatient services for at least 2 midnights  PT Class (Do Not Modify): Inpatient [101]  PT Acc Code (Do Not Modify): Private [1]       B Medical/Surgery History Past Medical History:  Diagnosis Date  . Anemia   . Arthritis    "hands, knees" (10/10/2017)  . Asthma   . CHF (congestive heart failure) ([place])   . Chronic kidney disease (CKD), stage IV (severe) ([place])    Dialysis T/ Th/ Sat  . Coronary artery disease   . H/O Clostridium difficile infection   . High cholesterol   . History of kidney stones   . Hypertension   . PVD (peripheral vascular disease) ([place])    "LLE; will have OR" (10/10/2017)  . Sleep apnea    "never given mask" (10/10/2017)  . Stroke [facility])    mild stroke mid April - 2019  . Type II  diabetes mellitus ([place])    "no RX anymore" (10/10/2017)   Past Surgical History:  Procedure Laterality Date  . A/V FISTULAGRAM N/A 12/27/2018   Procedure: A/V FISTULAGRAM - Left Upper;  Surgeon: Serafina Mitchell, MD;  Location: [place] [facility];  Service: Cardiovascular;  Laterality: N/A;  . AMPUTATION Left 11/10/2017   Procedure: AMPUTATION DIGIT SECOND TOE LEFT FOOT;  Surgeon: Waynetta Sandy, MD;  Location: [place];  Service: Vascular;  Laterality: Left;  . AMPUTATION Left 01/24/2018   Procedure: AMPUTATION THIRD TOE LEFT FOOT;  Surgeon: Waynetta Sandy, MD;  Location: [facility];  Service: Vascular;  Laterality: Left;  . AMPUTATION Right 12/22/2018   Procedure: Right foot 1st ray amputation;  Surgeon: Newt Minion, MD;  Location: [facility];  Service: Orthopedics;  Laterality: Right;  . AORTOGRAM N/A 11/03/2017   Procedure: Ultrasound Guided Cannulation Right Common Femoral Artery;  Aortagram with Left Lower Extremity Arteriogram; Attempted Treatment Left Superficial Femoral Artery; Percutaneous Closure Right Common Femoral Arteriotomy with Proglide Device;  Surgeon: Waynetta Sandy, MD;  Location: [facility] OR;  Service: Vascular;  Laterality: N/A;  . AV FISTULA PLACEMENT Left 11/10/2017   Procedure: ARTERIOVENOUS (AV) FISTULA CREATION LEFT UPPER ARM;  Surgeon: Waynetta Sandy, MD;  Location: [facility];  Service: Vascular;  Laterality: Left;  . HERNIA REPAIR  1950s  . INSERTION OF DIALYSIS CATHETER Right 11/10/2017   Procedure: INSERTION OF  TUNNELED DIALYSIS CATHETER RIGHT INTERNAL JUGULAR PLACEMENT;  Surgeon: Waynetta Sandy, MD;  Location: [place];  Service: Vascular;  Laterality: Right;  . IR FLUORO GUIDE CV LINE RIGHT  10/19/2017  . IR US GUIDE VASC ACCESS RIGHT  10/19/2017  . LEFT HEART CATH AND CORONARY ANGIOGRAPHY N/A 11/01/2017   Procedure: LEFT HEART CATH AND CORONARY ANGIOGRAPHY;  Surgeon: Belva Crome, MD;  Location: [place] [facility];  Service:  Cardiovascular;  Laterality: N/A;  . LOWER EXTREMITY ANGIOGRAPHY N/A 12/27/2018   Procedure: LOWER EXTREMITY ANGIOGRAPHY - Right Lower;  Surgeon: Serafina Mitchell, MD;  Location: [place] [facility];  Service: Cardiovascular;  Laterality: N/A;  . PERIPHERAL VASCULAR INTERVENTION Left 11/07/2017   Procedure: PERIPHERAL VASCULAR INTERVENTION;  Surgeon: Waynetta Sandy, MD;  Location: [place] [facility];  Service: Cardiovascular;  Laterality: Left;  left SFA  . PERIPHERAL VASCULAR INTERVENTION Right 12/27/2018   Procedure: PERIPHERAL VASCULAR INTERVENTION;  Surgeon: Serafina Mitchell, MD;  Location: [place] [facility];  Service: Cardiovascular;  Laterality: Right;  SFA  . SHOULDER OPEN ROTATOR CUFF REPAIR Left   . TUBAL LIGATION    . ULTRASOUND GUIDANCE FOR VASCULAR ACCESS  11/01/2017   Procedure: Ultrasound Guidance For Vascular Access;  Surgeon: Belva Crome, MD;  Location: [place] [facility];  Service: Cardiovascular;;     A IV Location/Drains/Wounds Patient Lines/Drains/Airways Status   Active Line/Drains/Airways    Name:   Placement date:   Placement time:   Site:   Days:   Peripheral IV 01/02/19 Right;Lateral Forearm   01/02/19    1640    Forearm   less than 1   Fistula / Graft Left Upper arm Arteriovenous fistula   -    -    Upper arm      Hemodialysis Catheter Right Internal jugular Double-lumen;Permanent   11/10/17    1102    Internal jugular   418   Negative Pressure Wound Therapy Foot   12/22/18    1445    -   11   Incision (Closed) 11/03/17 Groin Right   11/03/17    1905     425   Incision (Closed) 11/03/17 Leg Left   11/03/17    1905     425   Incision (Closed) 11/10/17 Chest Right   11/10/17    1123     418   Incision (Closed) 11/10/17 Arm Left   11/10/17    1123     418   Incision (Closed) 11/10/17 Foot Left   11/10/17    1139     418   Incision (Closed) 01/24/18 Foot Left   01/24/18    0841     343   Pressure Injury 11/01/17 Stage II -  Partial thickness loss of dermis  presenting as a shallow open ulcer with a red, pink wound bed without slough.   11/01/17    0600     427   Wound / Incision (Open or Dehisced) 12/21/18 Non-pressure wound Toe (Comment  which one) Anterior;Right Necrotic rt great toe   12/21/18    2122    Toe (Comment  which one)   12          Intake/Output Last 24 hours  Intake/Output Summary (Last 24 hours) at 01/02/2019 1905 Last data filed at 01/02/2019 1900 Gross per 24 hour  Intake 398.23 ml  Output -  Net 398.23 ml    Labs/Imaging Results for orders placed or performed  during the hospital encounter of 01/02/19 (from the past 48 hour(s))  CBC with Differential     Status: Abnormal   Collection Time: 01/02/19  4:41 PM  Result Value Ref Range   WBC 16.4 (H) 4.0 - 10.5 K/uL   RBC 3.09 (L) 3.87 - 5.11 MIL/uL   Hemoglobin 9.9 (L) 12.0 - 15.0 g/dL   HCT 31.2 (L) 36.0 - 46.0 %   MCV 101.0 (H) 80.0 - 100.0 fL   MCH 32.0 26.0 - 34.0 pg   MCHC 31.7 30.0 - 36.0 g/dL   RDW 14.6 11.5 - 15.5 %   Platelets 524 (H) 150 - 400 K/uL   nRBC 0.0 0.0 - 0.2 %   Neutrophils Relative % 77 %   Neutro Abs 12.8 (H) 1.7 - 7.7 K/uL   Lymphocytes Relative 12 %   Lymphs Abs 2.0 0.7 - 4.0 K/uL   Monocytes Relative 8 %   Monocytes Absolute 1.2 (H) 0.1 - 1.0 K/uL   Eosinophils Relative 1 %   Eosinophils Absolute 0.2 0.0 - 0.5 K/uL   Basophils Relative 1 %   Basophils Absolute 0.1 0.0 - 0.1 K/uL   Immature Granulocytes 1 %   Abs Immature Granulocytes 0.13 (H) 0.00 - 0.07 K/uL    Comment: Performed at [facility], [address] [place], [place] [address]  Basic metabolic panel     Status: Abnormal   Collection Time: 01/02/19  4:41 PM  Result Value Ref Range   Sodium 135 135 - 145 mmol/L   Potassium 3.7 3.5 - 5.1 mmol/L   Chloride 96 (L) 98 - 111 mmol/L   CO2 24 22 - 32 mmol/L   Glucose, Bld 120 (H) 70 - 99 mg/dL   BUN 29 (H) 8 - 23 mg/dL   Creatinine, Ser 3.45 (H) 0.44 - 1.00 mg/dL   Calcium 9.4 8.9 - 10.3 mg/dL   GFR calc non Af Amer 13  (L) >60 mL/min   GFR calc Af Amer 16 (L) >60 mL/min   Anion gap 15 5 - 15    Comment: Performed at [facility] [address] [place], [place] [address]  Vancomycin, random     Status: None   Collection Time: 01/02/19  4:41 PM  Result Value Ref Range   Vancomycin Rm 13     Comment:        Random Vancomycin therapeutic range is dependent on dosage and time of specimen collection. A peak range is 20.0-40.0 ug/mL A trough range is 5.0-15.0 ug/mL        Performed at [facility] [address] [place], [place] [address]   Lactic acid, plasma     Status: None   Collection Time: 01/02/19  4:41 PM  Result Value Ref Range   Lactic Acid, Venous 1.9 0.5 - 1.9 mmol/L    Comment: Performed at [facility] [address], [place], [place] [address]   Dg Foot Complete Right  Result Date: 01/02/2019 CLINICAL DATA:  Redness and swelling of the medial aspect of the right foot. 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 EXAM: RIGHT FOOT COMPLETE - 3+ VIEW COMPARISON:  Radiographs dated 12/21/2018 FINDINGS: There is edema and a small amount of gas in the soft tissues at the site of the previous resection of the first metatarsal. No evidence of osteomyelitis of the remaining bones. Slight arthritis between navicular and medial cuneiform, stable. Chronic calcaneal enthesophytes. Chronic dorsal spurring in the  midfoot. IMPRESSION: 1. No evidence of osteomyelitis. 2. Edema and small amount of gas in the soft tissues at the site of the previous resection of the first metatarsal and great toe. This could be residual postsurgical edema and air. Infection is not excluded. Electronically Signed   By: Lorriane Shire M.D.   On: 01/02/2019 16:20    Pending Labs Unresulted Labs (From admission, onward)    Start     Ordered   01/03/19 0500  CBC  Tomorrow morning,   R     01/02/19 1855   01/03/19 1660  Basic metabolic panel  Tomorrow morning,   R     01/02/19 1855   01/02/19 1627   Lactic acid, plasma  Now then every 2 hours,   STAT     01/02/19 1626   01/02/19 1609  Blood culture (routine x 2)  BLOOD CULTURE X 2,   STAT     01/02/19 1609          Vitals/Pain Today's Vitals   01/02/19 1800 01/02/19 1815 01/02/19 1830 01/02/19 1845  BP: (!) 111/58 102/63 (!) 129/53 (!) 119/104  Pulse: (!) 101 100 (!) 101 99  Resp: 16 19 12 18   Temp:      TempSrc:      SpO2: 98% 98% 99% 100%  Weight:      Height:        Isolation Precautions No active isolations  Medications Medications  vancomycin (VANCOCIN) IVPB 1000 mg/200 mL premix (has no administration in time range)  ceFEPIme (MAXIPIME) 2 g in sodium chloride 0.9 % 100 mL IVPB (has no administration in time range)  aspirin EC tablet 81 mg (has no administration in time range)  amLODipine (NORVASC) tablet 5 mg (has no administration in time range)  atorvastatin (LIPITOR) tablet 20 mg (has no administration in time range)  carvedilol (COREG) tablet 25 mg (has no administration in time range)  hydrALAZINE (APRESOLINE) tablet 25 mg (has no administration in time range)  calcium acetate (PHOSLO) capsule 667 mg (has no administration in time range)  pantoprazole (PROTONIX) EC tablet 40 mg (has no administration in time range)  polyethylene glycol (MIRALAX / GLYCOLAX) packet 17 g (has no administration in time range)  saccharomyces boulardii (FLORASTOR) capsule 250 mg (has no administration in time range)  alteplase (CATHFLO ACTIVASE) injection 2 mg (has no administration in time range)  gabapentin (NEURONTIN) capsule 100 mg (has no administration in time range)  multivitamin (RENA-VIT) tablet 1 tablet (has no administration in time range)  albuterol (PROVENTIL HFA;VENTOLIN HFA) 108 (90 Base) MCG/ACT inhaler 2 puff (has no administration in time range)  ceFEPIme (MAXIPIME) 2 g in sodium chloride 0.9 % 100 mL IVPB (0 g Intravenous Stopped 01/02/19 1725)  metroNIDAZOLE (FLAGYL) IVPB 500 mg (0 mg Intravenous Stopped 01/02/19  1747)  vancomycin (VANCOCIN) IVPB 1000 mg/200 mL premix (0 mg Intravenous Stopped 01/02/19 1900)    Mobility non-ambulatory High fall risk   Focused Assessments    R Recommendations: See Admitting Provider Note  Report given to:   Additional Notes:

## 2019-01-02 NOTE — Progress Notes (Signed)
Spoke to Infection Prevention specialist, Tamsen Meek, concerning patient's exposure to COVID positive employee on previous admission. Per infection prevention specialist given that patient is asymptomatic will not need repeat COVID testing or isolation precautions at this time. Patient will just need to be monitored closely. If patient becomes symptomatic then will need droplet/contact vs. Airborne/contact precautions and repeat COVID testing.   Spoke to RN, Rutherford Nail, to inform of this plan.   Dalphine Handing, PGY-2 [facility] 01/02/2019 10:26 PM

## 2019-01-03 ENCOUNTER — Encounter (HOSPITAL_COMMUNITY): Payer: Self-pay

## 2019-01-03 DIAGNOSIS — I2510 Atherosclerotic heart disease of native coronary artery without angina pectoris: Secondary | ICD-10-CM

## 2019-01-03 DIAGNOSIS — I96 Gangrene, not elsewhere classified: Secondary | ICD-10-CM

## 2019-01-03 DIAGNOSIS — Z794 Long term (current) use of insulin: Secondary | ICD-10-CM

## 2019-01-03 DIAGNOSIS — Z992 Dependence on renal dialysis: Secondary | ICD-10-CM

## 2019-01-03 DIAGNOSIS — E119 Type 2 diabetes mellitus without complications: Secondary | ICD-10-CM

## 2019-01-03 DIAGNOSIS — N186 End stage renal disease: Secondary | ICD-10-CM

## 2019-01-03 LAB — HEPARIN LEVEL (UNFRACTIONATED): Heparin Unfractionated: 1.76 IU/mL — ABNORMAL HIGH (ref 0.30–0.70)

## 2019-01-03 LAB — RENAL FUNCTION PANEL
Albumin: 2.4 g/dL — ABNORMAL LOW (ref 3.5–5.0)
Anion gap: 14 (ref 5–15)
BUN: 34 mg/dL — ABNORMAL HIGH (ref 8–23)
CO2: 21 mmol/L — ABNORMAL LOW (ref 22–32)
Calcium: 9.1 mg/dL (ref 8.9–10.3)
Chloride: 99 mmol/L (ref 98–111)
Creatinine, Ser: 3.82 mg/dL — ABNORMAL HIGH (ref 0.44–1.00)
GFR calc Af Amer: 14 mL/min — ABNORMAL LOW (ref >60)
GFR calc non Af Amer: 12 mL/min — ABNORMAL LOW (ref >60)
Glucose, Bld: 110 mg/dL — ABNORMAL HIGH (ref 70–99)
Phosphorus: 3.7 mg/dL (ref 2.5–4.6)
Potassium: 4.2 mmol/L (ref 3.5–5.1)
Sodium: 134 mmol/L — ABNORMAL LOW (ref 135–145)

## 2019-01-03 LAB — BASIC METABOLIC PANEL
Anion gap: 10 (ref 5–15)
BUN: 28 mg/dL — ABNORMAL HIGH (ref 8–23)
CO2: 21 mmol/L — ABNORMAL LOW (ref 22–32)
Calcium: 7.7 mg/dL — ABNORMAL LOW (ref 8.9–10.3)
Chloride: 105 mmol/L (ref 98–111)
Creatinine, Ser: 3.03 mg/dL — ABNORMAL HIGH (ref 0.44–1.00)
GFR calc Af Amer: 18 mL/min — ABNORMAL LOW (ref >60)
GFR calc non Af Amer: 16 mL/min — ABNORMAL LOW (ref >60)
Glucose, Bld: 84 mg/dL (ref 70–99)
Potassium: 2.8 mmol/L — ABNORMAL LOW (ref 3.5–5.1)
Sodium: 136 mmol/L (ref 135–145)

## 2019-01-03 LAB — GLUCOSE, CAPILLARY
Glucose-Capillary: 102 mg/dL — ABNORMAL HIGH (ref 70–99)
Glucose-Capillary: 118 mg/dL — ABNORMAL HIGH (ref 70–99)
Glucose-Capillary: 98 mg/dL (ref 70–99)
Glucose-Capillary: 171 mg/dL — ABNORMAL HIGH (ref 70–99)

## 2019-01-03 LAB — CBC
HCT: 25.7 % — ABNORMAL LOW (ref 36.0–46.0)
Hemoglobin: 8.4 g/dL — ABNORMAL LOW (ref 12.0–15.0)
MCH: 33.2 pg (ref 26.0–34.0)
MCHC: 32.7 g/dL (ref 30.0–36.0)
MCV: 101.6 fL — ABNORMAL HIGH (ref 80.0–100.0)
Platelets: 382 10*3/uL (ref 150–400)
RBC: 2.53 MIL/uL — ABNORMAL LOW (ref 3.87–5.11)
RDW: 14.7 % (ref 11.5–15.5)
WBC: 11.1 10*3/uL — ABNORMAL HIGH (ref 4.0–10.5)
nRBC: 0 % (ref 0.0–0.2)

## 2019-01-03 LAB — APTT
aPTT: 76 seconds — ABNORMAL HIGH (ref 24–36)
aPTT: 82 seconds — ABNORMAL HIGH (ref 24–36)

## 2019-01-03 MED ORDER — SODIUM CHLORIDE 0.9 % IV SOLN
100.0000 mL | INTRAVENOUS | Status: DC | PRN
  Administered 2019-01-05: 12:00:00 via INTRAVENOUS

## 2019-01-03 MED ORDER — POTASSIUM CHLORIDE CRYS ER 20 MEQ PO TBCR
40.0000 meq | EXTENDED_RELEASE_TABLET | Freq: Two times a day (BID) | ORAL | Status: DC
  Administered 2019-01-03: 40 meq via ORAL
  Filled 2019-01-03 (×2): qty 2

## 2019-01-03 MED ORDER — CHLORHEXIDINE GLUCONATE CLOTH 2 % EX PADS
6.0000 | MEDICATED_PAD | Freq: Every day | CUTANEOUS | Status: DC
  Administered 2019-01-03 – 2019-01-04 (×2): 6 via TOPICAL

## 2019-01-03 MED ORDER — HEPARIN SODIUM (PORCINE) 1000 UNIT/ML IJ SOLN
INTRAMUSCULAR | Status: AC
  Administered 2019-01-03: 3200 [IU]
  Filled 2019-01-03: qty 4

## 2019-01-03 MED ORDER — DARBEPOETIN ALFA 100 MCG/0.5ML IJ SOSY
PREFILLED_SYRINGE | INTRAMUSCULAR | Status: AC
  Administered 2019-01-03: 100 ug via INTRAVENOUS
  Filled 2019-01-03: qty 0.5

## 2019-01-03 MED ORDER — DARBEPOETIN ALFA 100 MCG/0.5ML IJ SOSY
100.0000 ug | PREFILLED_SYRINGE | INTRAMUSCULAR | Status: DC
  Administered 2019-01-03: 15:00:00 100 ug via INTRAVENOUS
  Filled 2019-01-03: qty 0.5

## 2019-01-03 MED ORDER — ALBUTEROL SULFATE HFA 108 (90 BASE) MCG/ACT IN AERS
1.0000 | INHALATION_SPRAY | Freq: Four times a day (QID) | RESPIRATORY_TRACT | Status: DC | PRN
  Filled 2019-01-03: qty 6.7

## 2019-01-03 NOTE — Progress Notes (Signed)
      INFECTIOUS DISEASE ATTENDING ADDENDUM:   Date: 01/03/2019  Patient name: Kristen Berry  Medical record number: [id number]  Date of birth: 03-Jan-1956   I discussed case with IP RN who had discussed with Dr. Baxter Flattery (who is currently in [place])   Per IP policy patients with contact with known CoVID are to be in droplet, contact x 2 weeks even if asymptomatic and even if COVID test negative.  I have relayed and discussed with Dr. [place] Pink in the command center.    Rhina Brackett Dam 01/03/2019, 11:07 AM

## 2019-01-03 NOTE — Plan of Care (Signed)

## 2019-01-03 NOTE — Progress Notes (Signed)
[facility] Progress Note    Background: Patient recently discharged from hospital 12/31/2018 for osteomyelitis R great toe S/P 1st Ray amputation 12/22/2018 per Dr. Sharol Given. During visit, patient was exposed to [place] 19 by healthcare worker, tested negative 12/27/2018. Has been in isolation at [facility] for 14 days S/P exposure. She was sent back to hospital 01/02/2019 for discoloration/breakdown of incision R Ray amputation. Seen by orthopedics, RLE not deemed to be salvageable. Plan to proceed with R transtibial amputation 01/05/19. Did have HD 01/01/2019. Patient discharged to SNF-now on MWF HD schedule.   Subjective: On telephone. No C/Os. Bed bugs noted crawling on pt. Spoke with Financial risk analyst. Needs to stay in this room, be dialyzed as separate.   Objective Vitals:   01/02/19 1900 01/02/19 1930 01/02/19 2008 01/03/19 0606  BP: 92/74 98/79 (!) 105/56 (!) 108/52  Pulse: 99 98 98 85  Resp: 14 15 17 18   Temp:   98.1 F (36.7 C) 97.8 F (36.6 C)  TempSrc:   Oral Oral  SpO2: 100% 99% 100% 100%  Weight:      Height:       Physical Exam General: Older female in NAD Heart: S1,S2 RRR Lungs: CTAB A/P Abdomen: active BS Extremities: R foot with blackened eschar along suture line. Trace RLE. No edema LLE.  Dialysis Access: RIJ [facility] CDI   Additional Objective Labs: Basic Metabolic Panel: Recent Labs  Lab 12/29/18 0226 12/30/18 0235 12/31/18 0440 01/02/19 1641 01/03/19 0535  NA 131* 130* 131* 135 136  K 3.6 3.7 3.9 3.7 2.8*  CL 92* 95* 96* 96* 105  CO2 25 24 22 24  21*  GLUCOSE 88 118* 90 120* 84  BUN 18 34* 26* 29* 28*  CREATININE 3.24* 4.73* 3.55* 3.45* 3.03*  CALCIUM 8.9 9.1 9.1 9.4 7.7*  PHOS 3.7 5.1* 3.4  --   --    Liver Function Tests: Recent Labs  Lab 12/29/18 0226 12/30/18 0235 12/31/18 0440  ALBUMIN 3.0* 2.9* 2.8*   No results for input(s): LIPASE, AMYLASE in the last 168 hours. CBC: Recent Labs  Lab 12/29/18 0226 12/30/18 0235  12/31/18 0440 01/02/19 1641 01/03/19 0535  WBC 14.4* 14.3* 14.1* 16.4* 11.1*  NEUTROABS  --   --   --  12.8*  --   HGB 11.0* 10.7* 10.5* 9.9* 8.4*  HCT 34.8* 33.0* 33.1* 31.2* 25.7*  MCV 101.5* 101.5* 101.5* 101.0* 101.6*  PLT 434* 426* 450* 524* 382   Blood Culture    Component Value Date/Time   SDES BLOOD RIGHT FOREARM 01/02/2019 1641   SDES BLOOD RIGHT HAND 01/02/2019 1641   SPECREQUEST  01/02/2019 1641    BOTTLES DRAWN AEROBIC ONLY Blood Culture results may not be optimal due to an inadequate volume of blood received in culture bottles   SPECREQUEST  01/02/2019 1641    BOTTLES DRAWN AEROBIC ONLY Blood Culture adequate volume   CULT  01/02/2019 1641    NO GROWTH < 24 HOURS Performed at [facility], [place] [address] [place], [place] [address]    CULT  01/02/2019 1641    NO GROWTH < 24 HOURS Performed at [facility] [address], [place], [place] [address]    REPTSTATUS PENDING 01/02/2019 1641   REPTSTATUS PENDING 01/02/2019 1641    Cardiac Enzymes: No results for input(s): CKTOTAL, CKMB, CKMBINDEX, TROPONINI in the last 168 hours. CBG: Recent Labs  Lab 12/30/18 2051 12/31/18 0555 12/31/18 1121 01/02/19 2145 01/03/19 0800  GLUCAP 119* 95 132*  172* 102*   Iron Studies: No results for input(s): IRON, TIBC, TRANSFERRIN, FERRITIN in the last 72 hours. @lablastinr3 @ Studies/Results: Dg Foot Complete Right  Result Date: 01/02/2019 CLINICAL DATA:  Redness and swelling of the medial aspect of the right foot. 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 EXAM: RIGHT FOOT COMPLETE - 3+ VIEW COMPARISON:  Radiographs dated 12/21/2018 FINDINGS: There is edema and a small amount of gas in the soft tissues at the site of the previous resection of the first metatarsal. No evidence of osteomyelitis of the remaining bones. Slight arthritis between navicular and medial cuneiform, stable. Chronic calcaneal enthesophytes. Chronic dorsal spurring in the midfoot.  IMPRESSION: 1. No evidence of osteomyelitis. 2. Edema and small amount of gas in the soft tissues at the site of the previous resection of the first metatarsal and great toe. This could be residual postsurgical edema and air. Infection is not excluded. Electronically Signed   By: Lorriane Shire M.D.   On: 01/02/2019 16:20   Medications: . [START ON 01/04/2019] ceFEPime (MAXIPIME) IV    . heparin 1,250 Units/hr (01/02/19 2238)  . [START ON 01/04/2019] vancomycin     . amLODipine  5 mg Oral QPM  . aspirin EC  81 mg Oral Daily  . atorvastatin  20 mg Oral q1800  . calcium acetate  667 mg Oral BID WC  . carvedilol  25 mg Oral BID  . gabapentin  100 mg Oral QHS  . multivitamin  1 tablet Oral QHS  . pantoprazole  40 mg Oral QPM  . potassium chloride  40 mEq Oral BID  . saccharomyces boulardii  250 mg Oral BID   HD orders: [facility] MWF 81.5 kg 180NRe 400/800 3.0 K/2.25 Ca 4 hrs RIJ TDC  -Heparin 2400 units IV TIW -Calcitriol 0.75 mcg PO TIW  Assessment/Plan: 1. 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 Plan for transtibial amp 01/05/19. Has been started on vanc and cefepime per primary.  2. Hypokalemia: K+ 2.8 40 MEQ KDUR PO today. Use 4.0 K bath with HD. Recheck K+ this evening.  3. ESRD -Now on MWF. HD today. No heparin. Must be done as separate.  4. Anemia -HGB 8.4. No recent ESA. Give Aranesp 100 mcg IV with HD today.  5. Secondary hyperparathyroidism - Ca 7.7. Continue VDRA/Binders 6. HTN/volume -BP on soft side. Wt 84.3 today however left under EDW last HD 01/01/19. No evidence of volume overload by exam. Attempt 2-2.5 liters 7. Nutrition - renal diet. Check albumin. Nepro. Renal vits.  8. Known COVID 19 exposure-Tested negative 12/27/2018. No symptoms. On droplet precautions. Has been on 14 day isolation which ends 01/09/19.  9.Active bed bug infestation. Notified primary. Will have HD as separate to prevent spread.  10. H/O chronic systolic HF EF 95-28% 11. H/O CAD-ASA,BB,  Eliquis  Kristen Berry H. Ankur Snowdon NP-C 01/03/2019, 10:05 AM  [employer] [phone]

## 2019-01-03 NOTE — Plan of Care (Signed)
  Problem: Education: °Goal: Knowledge of General Education information will improve °Description: Including pain rating scale, medication(s)/side effects and non-pharmacologic comfort measures °Outcome: Progressing °  °Problem: Coping: °Goal: Level of anxiety will decrease °Outcome: Progressing °  °Problem: Elimination: °Goal: Will not experience complications related to bowel motility °Outcome: Progressing °  °Problem: Pain Managment: °Goal: General experience of comfort will improve °Outcome: Progressing °  °Problem: Safety: °Goal: Ability to remain free from injury will improve °Outcome: Progressing °  °

## 2019-01-03 NOTE — Progress Notes (Signed)
ANTICOAGULATION CONSULT NOTE - Follow-up Consult  Pharmacy Consult for heparin Indication: bridge for h/o VTE for surgery  Allergies  Allergen Reactions  . Crestor [Rosuvastatin Calcium] Other (See Comments)    Leg pain    Patient Measurements: Height: 5\' 8"  (172.7 cm) Weight: 186 lb 11.7 oz (84.7 kg) IBW/kg (Calculated) : 63.9 Heparin Dosing Weight: 81kg  Vital Signs: Temp: 98.1 F (36.7 C) (04/08 1230) Temp Source: Oral (04/08 1230) BP: 135/56 (04/08 1400) Pulse Rate: 71 (04/08 1400)  Labs: Recent Labs    01/02/19 1641 01/02/19 2047 01/03/19 0535 01/03/19 1300  HGB 9.9*  --  8.4*  --   HCT 31.2*  --  25.7*  --   PLT 524*  --  382  --   APTT  --  60* 76*  --   HEPARINUNFRC  --  >2.20* 1.76*  --   CREATININE 3.45*  --  3.03* 3.82*    Estimated Creatinine Clearance: 17.2 mL/min (A) (by C-G formula based on SCr of 3.82 mg/dL (H)).   Medical History: Past Medical History:  Diagnosis Date  . Anemia   . Arthritis    "hands, knees" (10/10/2017)  . Asthma   . CHF (congestive heart failure) ([place])   . Chronic kidney disease (CKD), stage IV (severe) ([place])    Dialysis T/ Th/ Sat  . Coronary artery disease   . H/O Clostridium difficile infection   . High cholesterol   . History of kidney stones   . Hypertension   . PVD (peripheral vascular disease) ([place])    "LLE; will have OR" (10/10/2017)  . Sleep apnea    "never given mask" (10/10/2017)  . Stroke [facility])    mild stroke mid April - 2019  . 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 Patient also with h/o ESRD on HD. Admitted for worsening right foot wound. Patient to have procedure and apixaban held and heparin drip started. Patient on apixaban 2.5mg  BID PTA, however patient unsure of last dose but thinks it might have been this AM (heparin level 4/7 PM >2.2)  4/8 PM: aPTT this afternoon is therapeutic at 82, continue current heparin rate.  Hgb 8.4, pltc 382. No infusion problems per RN  Goal of Therapy:  APTT 66-102 sec Heparin level 0.3-0.7 units/ml Monitor platelets by anticoagulation protocol: Yes   Plan:  Continue heparin drip at 1250 units/hr Daily heparin level and aPTT Monitor for bleeding  Thank you for involving pharmacy in this patient's care.  Janae Bridgeman, PharmD PGY1 Pharmacy Resident Phone: [phone] 01/03/2019 3:06 PM

## 2019-01-03 NOTE — H&P (View-Only) (Signed)
ORTHOPAEDIC CONSULTATION  REQUESTING PHYSICIAN: No att. providers found  Chief Complaint: Gangrene right foot  HPI: Kristen Berry is a 63 y.o. 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  Patient is on dialysis has peripheral vascular disease she is status post revascularization to the right lower extremity with good popliteal and anterior tibial circulation.  Past Medical History:  Diagnosis Date  . Anemia   . Arthritis    "hands, knees" (10/10/2017)  . Asthma   . CHF (congestive heart failure) ([place])   . Chronic kidney disease (CKD), stage IV (severe) ([place])    Dialysis T/ Th/ Sat  . Coronary artery disease   . H/O Clostridium difficile infection   . High cholesterol   . History of kidney stones   . Hypertension   . PVD (peripheral vascular disease) ([place])    "LLE; will have OR" (10/10/2017)  . Sleep apnea    "never given mask" (10/10/2017)  . Stroke [facility])    mild stroke mid April - 2019  . Type II diabetes mellitus ([place])    "no RX anymore" (10/10/2017)   Past Surgical History:  Procedure Laterality Date  . A/V FISTULAGRAM N/A 12/27/2018   Procedure: A/V FISTULAGRAM - Left Upper;  Surgeon: Serafina Mitchell, MD;  Location: [place] [facility];  Service: Cardiovascular;  Laterality: N/A;  . AMPUTATION Left 11/10/2017   Procedure: AMPUTATION DIGIT SECOND TOE LEFT FOOT;  Surgeon: Waynetta Sandy, MD;  Location: [place];  Service: Vascular;  Laterality: Left;  . AMPUTATION Left 01/24/2018   Procedure: AMPUTATION THIRD TOE LEFT FOOT;  Surgeon: Waynetta Sandy, MD;  Location: [place];  Service: Vascular;  Laterality: Left;  . AMPUTATION Right 12/22/2018   Procedure: Right foot 1st ray amputation;  Surgeon: Newt Minion, MD;  Location: [place];  Service: Orthopedics;  Laterality: Right;  . AORTOGRAM N/A 11/03/2017   Procedure: Ultrasound Guided Cannulation Right Common Femoral Artery;  Aortagram with Left Lower  Extremity Arteriogram; Attempted Treatment Left Superficial Femoral Artery; Percutaneous Closure Right Common Femoral Arteriotomy with Proglide Device;  Surgeon: Waynetta Sandy, MD;  Location: [facility];  Service: Vascular;  Laterality: N/A;  . AV FISTULA PLACEMENT Left 11/10/2017   Procedure: ARTERIOVENOUS (AV) FISTULA CREATION LEFT UPPER ARM;  Surgeon: Waynetta Sandy, MD;  Location: [place];  Service: Vascular;  Laterality: Left;  . HERNIA REPAIR  1950s  . INSERTION OF DIALYSIS CATHETER Right 11/10/2017   Procedure: INSERTION OF TUNNELED DIALYSIS CATHETER RIGHT INTERNAL JUGULAR PLACEMENT;  Surgeon: Waynetta Sandy, MD;  Location: [place];  Service: Vascular;  Laterality: Right;  . IR FLUORO GUIDE CV LINE RIGHT  10/19/2017  . IR US GUIDE VASC ACCESS RIGHT  10/19/2017  . LEFT HEART CATH AND CORONARY ANGIOGRAPHY N/A 11/01/2017   Procedure: LEFT HEART CATH AND CORONARY ANGIOGRAPHY;  Surgeon: Belva Crome, MD;  Location: [place] [facility];  Service: Cardiovascular;  Laterality: N/A;  . LOWER EXTREMITY ANGIOGRAPHY N/A 12/27/2018   Procedure: LOWER EXTREMITY ANGIOGRAPHY - Right Lower;  Surgeon: Serafina Mitchell, MD;  Location: [place] [facility];  Service: Cardiovascular;  Laterality: N/A;  . PERIPHERAL VASCULAR INTERVENTION Left 11/07/2017   Procedure: PERIPHERAL VASCULAR INTERVENTION;  Surgeon: Waynetta Sandy, MD;  Location: [place] [facility];  Service: Cardiovascular;  Laterality: Left;  left SFA  . PERIPHERAL VASCULAR INTERVENTION Right 12/27/2018   Procedure: PERIPHERAL VASCULAR INTERVENTION;  Surgeon: Serafina Mitchell,  MD;  Location: [place] [facility];  Service: Cardiovascular;  Laterality: Right;  SFA  . SHOULDER OPEN ROTATOR CUFF REPAIR Left   . TUBAL LIGATION    . ULTRASOUND GUIDANCE FOR VASCULAR ACCESS  11/01/2017   Procedure: Ultrasound Guidance For Vascular Access;  Surgeon: Belva Crome, MD;  Location: [place] [facility];  Service: Cardiovascular;;   Social  History   Socioeconomic History  . Marital status: Divorced    Spouse name: Not on file  . Number of children: 3  . Years of education: Not on file  . Highest education level: Not on file  Occupational History  . Occupation: disabled  Social Needs  . Financial resource strain: Not on file  . Food insecurity:    Worry: Not on file    Inability: Not on file  . Transportation needs:    Medical: Not on file    Non-medical: Not on file  Tobacco Use  . Smoking status: Never Smoker  . Smokeless tobacco: Never Used  Substance and Sexual Activity  . Alcohol use: No  . Drug use: No  . Sexual activity: Never  Lifestyle  . Physical activity:    Days per week: Not on file    Minutes per session: Not on file  . Stress: Not on file  Relationships  . Social connections:    Talks on phone: Not on file    Gets together: Not on file    Attends religious service: Not on file    Active member of club or organization: Not on file    Attends meetings of clubs or organizations: Not on file    Relationship status: Not on file  Other Topics Concern  . Not on file  Social History Narrative  . Not on file   Family History  Problem Relation Age of Onset  . Hypertension Father   . Heart failure Maternal Grandmother   . Heart failure Maternal Grandfather    - negative except otherwise stated in the family history section Allergies  Allergen Reactions  . Crestor [Rosuvastatin Calcium] Other (See Comments)    Leg pain   Prior to Admission medications   Medication Sig Start Date End Date Taking? Authorizing Provider  acetaminophen (TYLENOL) 500 MG tablet Take 1,500 mg by mouth daily as needed for mild pain or headache.     [provider]  albuterol (PROAIR HFA) 108 (90 Base) MCG/ACT inhaler Inhale 2 puffs into the lungs every 6 (six) hours as needed for wheezing or shortness of breath.    [provider]  alteplase (CATHFLO ACTIVASE) 2 MG injection 2 mg by Intracatheter  route once as needed for open catheter. 12/30/18   Bonnita Hollow, MD  amLODipine (NORVASC) 5 MG tablet Take 5 mg by mouth every evening.     [provider]  apixaban (ELIQUIS) 2.5 MG TABS tablet Take 1 tablet (2.5 mg total) by mouth 2 (two) times daily. 12/30/18   Bonnita Hollow, MD  aspirin EC 81 MG EC tablet Take 1 tablet (81 mg total) by mouth daily. 12/30/18   Bonnita Hollow, MD  atorvastatin (LIPITOR) 20 MG tablet Take 1 tablet (20 mg total) by mouth daily at 6 PM. 11/16/17   Domenic Polite, MD  calcitRIOL (ROCALTROL) 0.25 MCG capsule Take 3 capsules (0.75 mcg total) by mouth Every Tuesday,Thursday,and Saturday with dialysis. 12/30/18   Bonnita Hollow, MD  calcium acetate (PHOSLO) 667 MG capsule Take 667 mg by mouth 2 (two)  times daily with a meal.  10/27/18   [provider]  carvedilol (COREG) 25 MG tablet Take 25 mg by mouth 2 (two) times daily. 08/26/18   [provider]  clotrimazole-betamethasone (LOTRISONE) cream Apply 1 application topically daily as needed (rash).  01/19/18   [provider]  diphenoxylate-atropine (LOMOTIL) 2.5-0.025 MG tablet Take 1 tablet by mouth every 12 (twelve) hours as needed for diarrhea or loose stools.     [provider]  gabapentin (NEURONTIN) 100 MG capsule Take 1 capsule (100 mg total) by mouth at bedtime. 12/30/18   Bonnita Hollow, MD  heparin 1000 unit/mL SOLN injection 1 mL (1,000 Units total) by Dialysis route as needed (in dialysis). 12/30/18   Bonnita Hollow, MD  heparin 1000 unit/mL SOLN injection 1.6 mLs (1,600 Units total) by Dialysis route as needed (in dialysis). 12/30/18   Bonnita Hollow, MD  heparin 1000 unit/mL SOLN injection 3 mLs (3,000 Units total) by Dialysis route as needed (in dialysis). 12/31/18   Bonnita Hollow, MD  hydrALAZINE (APRESOLINE) 25 MG tablet Take 25 mg by mouth 2 (two) times daily.  01/25/18   Rhyne, Hulen Shouts, PA-C  lidocaine-prilocaine (EMLA) cream Apply 1 application  topically daily as needed (dialysis).  09/28/18   [provider]  multivitamin (RENA-VIT) TABS tablet Take 1 tablet by mouth at bedtime. 12/30/18   Bonnita Hollow, MD  oxyCODONE (OXY IR/ROXICODONE) 5 MG immediate release tablet Take 1 tablet (5 mg total) by mouth every 12 (twelve) hours as needed for moderate pain or severe pain. 12/30/18   Bonnita Hollow, MD  pantoprazole (PROTONIX) 40 MG tablet Take 40 mg by mouth every evening.     [provider]  pentafluoroprop-tetrafluoroeth Landry Dyke) AERO Apply 1 application topically as needed (topical anesthesia for hemodialysis). 12/30/18   Bonnita Hollow, MD  pentoxifylline (TRENTAL) 400 MG CR tablet Take 1 tablet (400 mg total) by mouth daily with supper. 12/30/18   Bonnita Hollow, MD  polyethylene glycol [facility] / Floria Raveling) packet Take 17 g by mouth daily as needed for mild constipation. 12/30/18   Bonnita Hollow, MD  saccharomyces boulardii (FLORASTOR) 250 MG capsule Take 1 capsule (250 mg total) by mouth 2 (two) times daily. Patient taking differently: Take 250 mg by mouth every evening.  01/12/18   Caren Griffins, MD  ticagrelor (BRILINTA) 90 MG TABS tablet Take 90 mg by mouth 2 (two) times daily.     [provider]  Vancomycin (VANCOCIN) 750-5 MG/150ML-% SOLN Inject 150 mLs (750 mg total) into the vein Every Tuesday,Thursday,and Saturday with dialysis for 4 days. 12/30/18 01/03/19  Bonnita Hollow, MD   Dg Foot Complete Right  Result Date: 01/02/2019 CLINICAL DATA:  Redness and swelling of the medial aspect of the right foot. 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 EXAM: RIGHT FOOT COMPLETE - 3+ VIEW COMPARISON:  Radiographs dated 12/21/2018 FINDINGS: There is edema and a small amount of gas in the soft tissues at the site of the previous resection of the first metatarsal. No evidence of osteomyelitis of the remaining bones. Slight arthritis between navicular and medial cuneiform, stable. Chronic  calcaneal enthesophytes. Chronic dorsal spurring in the midfoot. IMPRESSION: 1. No evidence of osteomyelitis. 2. Edema and small amount of gas in the soft tissues at the site of the previous resection of the first metatarsal and great toe. This could be residual postsurgical edema and air. Infection is not excluded. Electronically Signed  By: Lorriane Shire M.D.   On: 01/02/2019 16:20   - pertinent xrays, CT, MRI studies were reviewed and independently interpreted  Positive ROS: All other systems have been reviewed and were otherwise negative with the exception of those mentioned in the HPI and as above.  Physical Exam: General: Alert, no acute distress Psychiatric: Patient is competent for consent with normal mood and affect Lymphatic: No axillary or cervical lymphadenopathy Cardiovascular: No pedal edema Respiratory: No cyanosis, no use of accessory musculature GI: No organomegaly, abdomen is soft and non-tender    Images:  @ENCIMAGES @  Labs:  Lab Results  Component Value Date   HGBA1C 5.5 12/22/2018   HGBA1C 5.5 10/13/2017   HGBA1C 6.6 (H) 06/28/2013   ESRSEDRATE 100 (H) 12/21/2018   ESRSEDRATE 127 (H) 10/27/2017   CRP 18.8 (H) 10/27/2017   REPTSTATUS PENDING 01/02/2019   REPTSTATUS PENDING 01/02/2019   CULT  01/02/2019    NO GROWTH < 24 HOURS Performed at [facility], [place] [address], [place], [place] [address]    CULT  01/02/2019    NO GROWTH < 24 HOURS Performed at [facility] [address] [place], [place] [address]     Lab Results  Component Value Date   ALBUMIN 2.8 (L) 12/31/2018   ALBUMIN 2.9 (L) 12/30/2018   ALBUMIN 3.0 (L) 12/29/2018    Neurologic: Patient does not have protective sensation bilateral lower extremities.   MUSCULOSKELETAL:   Skin: Examination patient has acute ischemic changes to the entire right foot there is black ischemic gangrenous changes to the right heel over the surgical incision and lateral aspect of the right  foot.  She has an ischemic ulcer anteriorly over the ankle.  Vascular studies show she has a popliteal flow as well as anterior tibial flow.  There is no ascending cellulitis she has dry gangrene of the right foot Wagner grade 5 ulceration.  Assessment: Assessment: End-stage renal disease on dialysis with peripheral vascular disease status post revascularization to the right lower extremity with intact macro circulation with occlusion of the microcirculation and acute gangrene of the entire right foot.  Plan: Patient does not have any further limb salvage options for the right foot.  Discussed that we would need to proceed with a transtibial amputation on the right.  Discussed that with her intact popliteal flow she should heal the incision well however with her microcirculatory dysfunction she may have a hard time healing the incision risk and benefits were discussed including nonhealing of the wound and the need for additional surgery.  Patient states she understands wishes to proceed with surgery we will plan for surgery on Friday.  Thank you for the consult and the opportunity to see Ms. [place], MD [facility] [phone] 7:24 AM

## 2019-01-03 NOTE — Progress Notes (Signed)
ANTICOAGULATION CONSULT NOTE - Follow Up Consult  Pharmacy Consult for heparin Indication: h/o VTE  Labs: Recent Labs    01/02/19 1641 01/02/19 2047 01/03/19 0535  HGB 9.9*  --  8.4*  HCT 31.2*  --  25.7*  PLT 524*  --  382  APTT  --  60* 76*  HEPARINUNFRC  --  >2.20* 1.76*  CREATININE 3.45*  --  3.03*    Plan/Assessment: 63yo female therapeutic on heparin with initial dosing while Eliquis on hold. Will continue gtt at current rate and confirm stable with additional PTT.   Kristen Berry, PharmD, BCPS  01/03/2019,7:27 AM

## 2019-01-03 NOTE — Progress Notes (Signed)
Family Medicine Teaching Service Daily Progress Note Intern Pager: [phone]  Patient name: Kristen Berry Medical record number: [id number] Date of birth: 22-Jun-1956 Age: 63 y.o. Gender: female  Primary Care Provider: [place], Sarah T, MD Consultants: Manson Passey, vascular surgery Code Status: full  Pt Overview and Major Events to Date:  4/7 admitted to fpts  Assessment and Plan: Kristen Berry is a 63 y.o. female presenting with worsening discoloration of right foot and cellulitis . PMH is significant forosteomyelitis of the left toes s/p amputation, ESRD, diabetes type 2, hypertension, history of DVT, history of stroke, CHF, MGUS  Worsening necrosis of right second toe and foot, right foot cellulitis Worsening of skin necrosis 2/2 peripheral vascular disease. Per Dr. Jess Barters note, has no further limb salvage options. Scheduled for right leg transtibial amp on 4/10. Unclear benefit of antibiotics as this is not felt to be cellulitis. She has been receiving vancomycin with dialysis with last dose scheduled to be 4/8. Hold eliquis, heparin gtt for dvt ppx and for past bilateral leg dvts.  - vital signs per floor routine - restart renal diet - stop vancomycin and cefepime - Continue heparin gtt - hold brillinta and aspirin, restart 24 hours post op - Likely transtibial amp on 4/10 - tylenol 1g tid  ESRD on HDMWF Dialysis per nephro. Appreciate their assistance. Continue phoslo, renally dose meds.  CoViD-19 exposure Exposed to + hospital personnel during last admission. Per ID pt to be droplet and contact precaution x2 weeks. If asymptomatic in until 4/15, will lift these precautions.  Bed Bugs Patient with bed bugs discovered on her this am. Contact precautions, sanitize room when able  Diabetes mellitus type 2 5.5 on 12/22/2018.  - CBG q4H while NPO - SSI can be added if needed  MGUS Diagnosed in 2017. Patient not currently on any medication. [facility]  HFrEF January 2018 patient had echo that showed LVEF 35 to 40%. Septal and apical akinesis, moderately dilated LV, mild LVH. Patient not currently in respiratory distress. No signs of acute worsening.  CAD left heart cath in February 2019 showed severe diffuse diabetic coronary disease, 70 to 80% stenosis of proximal LAD 75% obstruction of RCA  - hold brilliinta in the setting of likely surgical intervention  Mild asthma  since childhood. Hasn't usedher albuterolin months. No other medications. - prn albuterol inhaler  HTN PT takes amlodipine 5mg , Coreg 25mg  BID, hydralazine 25mg  BID at home. BP 132/52.  -continue home coreg, norvasc - add back hydral if BPs permit  HLD- pt takes 20mg  lipitor at home. - Continue Lipitor  History of left leg DVT patient takes Eliquis 2.5 mg BID for hx of DVT in left leg.  - hold eliquis - heparin gtt dosed per pharm in setting of likely surgical intervention  Hx of stroke -patient states she had a stroke May of last year. Was treated at [facility] in [place]. Does not appear to have any residual deficits. Patient takes Brilinta 90 mg twice daily.  -Hold Brilinta. Restart after surgery  FEN/GI: renal diet PPx: heparin gtt  Disposition: likely back to snf  Subjective:  Doing well this am. Handling news of needed amputation well. Paying her bills this am  Objective: Temp:  [97.8 F (36.6 C)-98.1 F (36.7 C)] 97.8 F (36.6 C) (04/08 0606) Pulse Rate:  [75-104] 85 (04/08 0606) Resp:  [12-22] 18 (04/08 0606) BP: (92-141)/(51-104) 108/52 (04/08 0606) SpO2:  [98 %-100 %] 100 % (04/08  4469) Weight:  [84.3 kg] 84.3 kg (04/07 1606) Physical Exam: General: 63 year old caucasian female, no acute distress Cardiovascular: rrr, no m/r/g. Respiratory: lungs clear to auscultation bilaterally Abdomen: soft, non-tender, non-distended Extremities: black ischemic gangrenous changes right great toe amp  site.     Laboratory: Recent Labs  Lab 12/31/18 0440 01/02/19 1641 01/03/19 0535  WBC 14.1* 16.4* 11.1*  HGB 10.5* 9.9* 8.4*  HCT 33.1* 31.2* 25.7*  PLT 450* 524* 382   Recent Labs  Lab 12/31/18 0440 01/02/19 1641 01/03/19 0535  NA 131* 135 136  K 3.9 3.7 2.8*  CL 96* 96* 105  CO2 22 24 21*  BUN 26* 29* 28*  CREATININE 3.55* 3.45* 3.03*  CALCIUM 9.1 9.4 7.7*  GLUCOSE 90 120* 84   Imaging/Diagnostic Tests: CLINICAL DATA:  Redness and swelling of the medial aspect of the right foot. 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  EXAM: RIGHT FOOT COMPLETE - 3+ VIEW  COMPARISON:  Radiographs dated 12/21/2018  FINDINGS: There is edema and a small amount of gas in the soft tissues at the site of the previous resection of the first metatarsal. No evidence of osteomyelitis of the remaining bones. Slight arthritis between navicular and medial cuneiform, stable. Chronic calcaneal enthesophytes. Chronic dorsal spurring in the midfoot.  IMPRESSION: 1. No evidence of osteomyelitis. 2. Edema and small amount of gas in the soft tissues at the site of the previous resection of the first metatarsal and great toe. This could be residual postsurgical edema and air. Infection is not excluded.   Electronically Signed   By: Lorriane Shire M.D.   On: 01/02/2019 16:20  [name], MD 01/03/2019, 9:49 AM PGY-2, [place] Intern pager: [phone], text pages welcome

## 2019-01-03 NOTE — Consult Note (Signed)
ORTHOPAEDIC CONSULTATION  REQUESTING PHYSICIAN: No att. providers found  Chief Complaint: Gangrene right foot  HPI: Kristen Berry is a 63 y.o. 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  Patient is on dialysis has peripheral vascular disease she is status post revascularization to the right lower extremity with good popliteal and anterior tibial circulation.  Past Medical History:  Diagnosis Date  . Anemia   . Arthritis    "hands, knees" (10/10/2017)  . Asthma   . CHF (congestive heart failure) ([place])   . Chronic kidney disease (CKD), stage IV (severe) ([place])    Dialysis T/ Th/ Sat  . Coronary artery disease   . H/O Clostridium difficile infection   . High cholesterol   . History of kidney stones   . Hypertension   . PVD (peripheral vascular disease) ([place])    "LLE; will have OR" (10/10/2017)  . Sleep apnea    "never given mask" (10/10/2017)  . Stroke [facility])    mild stroke mid April - 2019  . Type II diabetes mellitus ([place])    "no RX anymore" (10/10/2017)   Past Surgical History:  Procedure Laterality Date  . A/V FISTULAGRAM N/A 12/27/2018   Procedure: A/V FISTULAGRAM - Left Upper;  Surgeon: Serafina Mitchell, MD;  Location: [place] [facility];  Service: Cardiovascular;  Laterality: N/A;  . AMPUTATION Left 11/10/2017   Procedure: AMPUTATION DIGIT SECOND TOE LEFT FOOT;  Surgeon: Waynetta Sandy, MD;  Location: [place];  Service: Vascular;  Laterality: Left;  . AMPUTATION Left 01/24/2018   Procedure: AMPUTATION THIRD TOE LEFT FOOT;  Surgeon: Waynetta Sandy, MD;  Location: [place];  Service: Vascular;  Laterality: Left;  . AMPUTATION Right 12/22/2018   Procedure: Right foot 1st ray amputation;  Surgeon: Newt Minion, MD;  Location: [place];  Service: Orthopedics;  Laterality: Right;  . AORTOGRAM N/A 11/03/2017   Procedure: Ultrasound Guided Cannulation Right Common Femoral Artery;  Aortagram with Left Lower  Extremity Arteriogram; Attempted Treatment Left Superficial Femoral Artery; Percutaneous Closure Right Common Femoral Arteriotomy with Proglide Device;  Surgeon: Waynetta Sandy, MD;  Location[facility];  Service: Vascular;  Laterality: N/A;  . AV FISTULA PLACEMENT Left 11/10/2017   Procedure: ARTERIOVENOUS (AV) FISTULA CREATION LEFT UPPER ARM;  Surgeon: Waynetta Sandy, MD;  Location: [place];  Service: Vascular;  Laterality: Left;  . HERNIA REPAIR  1950s  . INSERTION OF DIALYSIS CATHETER Right 11/10/2017   Procedure: INSERTION OF TUNNELED DIALYSIS CATHETER RIGHT INTERNAL JUGULAR PLACEMENT;  Surgeon: Waynetta Sandy, MD;  Location: [place];  Service: Vascular;  Laterality: Right;  . IR FLUORO GUIDE CV LINE RIGHT  10/19/2017  . IR US GUIDE VASC ACCESS RIGHT  10/19/2017  . LEFT HEART CATH AND CORONARY ANGIOGRAPHY N/A 11/01/2017   Procedure: LEFT HEART CATH AND CORONARY ANGIOGRAPHY;  Surgeon: Belva Crome, MD;  Location: [place] [facility];  Service: Cardiovascular;  Laterality: N/A;  . LOWER EXTREMITY ANGIOGRAPHY N/A 12/27/2018   Procedure: LOWER EXTREMITY ANGIOGRAPHY - Right Lower;  Surgeon: Serafina Mitchell, MD;  Location: [place] [facility];  Service: Cardiovascular;  Laterality: N/A;  . PERIPHERAL VASCULAR INTERVENTION Left 11/07/2017   Procedure: PERIPHERAL VASCULAR INTERVENTION;  Surgeon: Waynetta Sandy, MD;  Location: [place] [facility];  Service: Cardiovascular;  Laterality: Left;  left SFA  . PERIPHERAL VASCULAR INTERVENTION Right 12/27/2018   Procedure: PERIPHERAL VASCULAR INTERVENTION;  Surgeon: Serafina Mitchell,  MD;  Location: [place] [facility];  Service: Cardiovascular;  Laterality: Right;  SFA  . SHOULDER OPEN ROTATOR CUFF REPAIR Left   . TUBAL LIGATION    . ULTRASOUND GUIDANCE FOR VASCULAR ACCESS  11/01/2017   Procedure: Ultrasound Guidance For Vascular Access;  Surgeon: Belva Crome, MD;  Location: [place] [facility];  Service: Cardiovascular;;   Social  History   Socioeconomic History  . Marital status: Divorced    Spouse name: Not on file  . Number of children: 3  . Years of education: Not on file  . Highest education level: Not on file  Occupational History  . Occupation: disabled  Social Needs  . Financial resource strain: Not on file  . Food insecurity:    Worry: Not on file    Inability: Not on file  . Transportation needs:    Medical: Not on file    Non-medical: Not on file  Tobacco Use  . Smoking status: Never Smoker  . Smokeless tobacco: Never Used  Substance and Sexual Activity  . Alcohol use: No  . Drug use: No  . Sexual activity: Never  Lifestyle  . Physical activity:    Days per week: Not on file    Minutes per session: Not on file  . Stress: Not on file  Relationships  . Social connections:    Talks on phone: Not on file    Gets together: Not on file    Attends religious service: Not on file    Active member of club or organization: Not on file    Attends meetings of clubs or organizations: Not on file    Relationship status: Not on file  Other Topics Concern  . Not on file  Social History Narrative  . Not on file   Family History  Problem Relation Age of Onset  . Hypertension Father   . Heart failure Maternal Grandmother   . Heart failure Maternal Grandfather    - negative except otherwise stated in the family history section Allergies  Allergen Reactions  . Crestor [Rosuvastatin Calcium] Other (See Comments)    Leg pain   Prior to Admission medications   Medication Sig Start Date End Date Taking? Authorizing Provider  acetaminophen (TYLENOL) 500 MG tablet Take 1,500 mg by mouth daily as needed for mild pain or headache.     [provider]  albuterol (PROAIR HFA) 108 (90 Base) MCG/ACT inhaler Inhale 2 puffs into the lungs every 6 (six) hours as needed for wheezing or shortness of breath.    [provider]  alteplase (CATHFLO ACTIVASE) 2 MG injection 2 mg by Intracatheter  route once as needed for open catheter. 12/30/18   Bonnita Hollow, MD  amLODipine (NORVASC) 5 MG tablet Take 5 mg by mouth every evening.     [provider]  apixaban (ELIQUIS) 2.5 MG TABS tablet Take 1 tablet (2.5 mg total) by mouth 2 (two) times daily. 12/30/18   Bonnita Hollow, MD  aspirin EC 81 MG EC tablet Take 1 tablet (81 mg total) by mouth daily. 12/30/18   Bonnita Hollow, MD  atorvastatin (LIPITOR) 20 MG tablet Take 1 tablet (20 mg total) by mouth daily at 6 PM. 11/16/17   Domenic Polite, MD  calcitRIOL (ROCALTROL) 0.25 MCG capsule Take 3 capsules (0.75 mcg total) by mouth Every Tuesday,Thursday,and Saturday with dialysis. 12/30/18   Bonnita Hollow, MD  calcium acetate (PHOSLO) 667 MG capsule Take 667 mg by mouth 2 (two)  times daily with a meal.  10/27/18   [provider]  carvedilol (COREG) 25 MG tablet Take 25 mg by mouth 2 (two) times daily. 08/26/18   [provider]  clotrimazole-betamethasone (LOTRISONE) cream Apply 1 application topically daily as needed (rash).  01/19/18   [provider]  diphenoxylate-atropine (LOMOTIL) 2.5-0.025 MG tablet Take 1 tablet by mouth every 12 (twelve) hours as needed for diarrhea or loose stools.     [provider]  gabapentin (NEURONTIN) 100 MG capsule Take 1 capsule (100 mg total) by mouth at bedtime. 12/30/18   Bonnita Hollow, MD  heparin 1000 unit/mL SOLN injection 1 mL (1,000 Units total) by Dialysis route as needed (in dialysis). 12/30/18   Bonnita Hollow, MD  heparin 1000 unit/mL SOLN injection 1.6 mLs (1,600 Units total) by Dialysis route as needed (in dialysis). 12/30/18   Bonnita Hollow, MD  heparin 1000 unit/mL SOLN injection 3 mLs (3,000 Units total) by Dialysis route as needed (in dialysis). 12/31/18   Bonnita Hollow, MD  hydrALAZINE (APRESOLINE) 25 MG tablet Take 25 mg by mouth 2 (two) times daily.  01/25/18   Rhyne, Hulen Shouts, PA-C  lidocaine-prilocaine (EMLA) cream Apply 1 application  topically daily as needed (dialysis).  09/28/18   [provider]  multivitamin (RENA-VIT) TABS tablet Take 1 tablet by mouth at bedtime. 12/30/18   Bonnita Hollow, MD  oxyCODONE (OXY IR/ROXICODONE) 5 MG immediate release tablet Take 1 tablet (5 mg total) by mouth every 12 (twelve) hours as needed for moderate pain or severe pain. 12/30/18   Bonnita Hollow, MD  pantoprazole (PROTONIX) 40 MG tablet Take 40 mg by mouth every evening.     [provider]  pentafluoroprop-tetrafluoroeth Landry Dyke) AERO Apply 1 application topically as needed (topical anesthesia for hemodialysis). 12/30/18   Bonnita Hollow, MD  pentoxifylline (TRENTAL) 400 MG CR tablet Take 1 tablet (400 mg total) by mouth daily with supper. 12/30/18   Bonnita Hollow, MD  polyethylene glycol [facility] Floria Raveling) packet Take 17 g by mouth daily as needed for mild constipation. 12/30/18   Bonnita Hollow, MD  saccharomyces boulardii (FLORASTOR) 250 MG capsule Take 1 capsule (250 mg total) by mouth 2 (two) times daily. Patient taking differently: Take 250 mg by mouth every evening.  01/12/18   Caren Griffins, MD  ticagrelor (BRILINTA) 90 MG TABS tablet Take 90 mg by mouth 2 (two) times daily.     [provider]  Vancomycin (VANCOCIN) 750-5 MG/150ML-% SOLN Inject 150 mLs (750 mg total) into the vein Every Tuesday,Thursday,and Saturday with dialysis for 4 days. 12/30/18 01/03/19  Bonnita Hollow, MD   Dg Foot Complete Right  Result Date: 01/02/2019 CLINICAL DATA:  Redness and swelling of the medial aspect of the right foot. 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 EXAM: RIGHT FOOT COMPLETE - 3+ VIEW COMPARISON:  Radiographs dated 12/21/2018 FINDINGS: There is edema and a small amount of gas in the soft tissues at the site of the previous resection of the first metatarsal. No evidence of osteomyelitis of the remaining bones. Slight arthritis between navicular and medial cuneiform, stable. Chronic  calcaneal enthesophytes. Chronic dorsal spurring in the midfoot. IMPRESSION: 1. No evidence of osteomyelitis. 2. Edema and small amount of gas in the soft tissues at the site of the previous resection of the first metatarsal and great toe. This could be residual postsurgical edema and air. Infection is not excluded. Electronically Signed  By: Lorriane Shire M.D.   On: 01/02/2019 16:20   - pertinent xrays, CT, MRI studies were reviewed and independently interpreted  Positive ROS: All other systems have been reviewed and were otherwise negative with the exception of those mentioned in the HPI and as above.  Physical Exam: General: Alert, no acute distress Psychiatric: Patient is competent for consent with normal mood and affect Lymphatic: No axillary or cervical lymphadenopathy Cardiovascular: No pedal edema Respiratory: No cyanosis, no use of accessory musculature GI: No organomegaly, abdomen is soft and non-tender    Images:  @ENCIMAGES @  Labs:  Lab Results  Component Value Date   HGBA1C 5.5 12/22/2018   HGBA1C 5.5 10/13/2017   HGBA1C 6.6 (H) 06/28/2013   ESRSEDRATE 100 (H) 12/21/2018   ESRSEDRATE 127 (H) 10/27/2017   CRP 18.8 (H) 10/27/2017   REPTSTATUS PENDING 01/02/2019   REPTSTATUS PENDING 01/02/2019   CULT  01/02/2019    NO GROWTH < 24 HOURS Performed at [facility], [place] [address] [place], [place] [address]    CULT  01/02/2019    NO GROWTH < 24 HOURS Performed at [facility] [address], [place], [place] [address]     Lab Results  Component Value Date   ALBUMIN 2.8 (L) 12/31/2018   ALBUMIN 2.9 (L) 12/30/2018   ALBUMIN 3.0 (L) 12/29/2018    Neurologic: Patient does not have protective sensation bilateral lower extremities.   MUSCULOSKELETAL:   Skin: Examination patient has acute ischemic changes to the entire right foot there is black ischemic gangrenous changes to the right heel over the surgical incision and lateral aspect of the right  foot.  She has an ischemic ulcer anteriorly over the ankle.  Vascular studies show she has a popliteal flow as well as anterior tibial flow.  There is no ascending cellulitis she has dry gangrene of the right foot Wagner grade 5 ulceration.  Assessment: Assessment: End-stage renal disease on dialysis with peripheral vascular disease status post revascularization to the right lower extremity with intact macro circulation with occlusion of the microcirculation and acute gangrene of the entire right foot.  Plan: Patient does not have any further limb salvage options for the right foot.  Discussed that we would need to proceed with a transtibial amputation on the right.  Discussed that with her intact popliteal flow she should heal the incision well however with her microcirculatory dysfunction she may have a hard time healing the incision risk and benefits were discussed including nonhealing of the wound and the need for additional surgery.  Patient states she understands wishes to proceed with surgery we will plan for surgery on Friday.  Thank you for the consult and the opportunity to see Ms. [place], MD [facility] [phone] 7:24 AM

## 2019-01-04 ENCOUNTER — Ambulatory Visit (INDEPENDENT_AMBULATORY_CARE_PROVIDER_SITE_OTHER): Payer: Self-pay | Admitting: Physician Assistant

## 2019-01-04 DIAGNOSIS — Z89422 Acquired absence of other left toe(s): Secondary | ICD-10-CM

## 2019-01-04 DIAGNOSIS — D472 Monoclonal gammopathy: Secondary | ICD-10-CM

## 2019-01-04 DIAGNOSIS — E1122 Type 2 diabetes mellitus with diabetic chronic kidney disease: Secondary | ICD-10-CM

## 2019-01-04 DIAGNOSIS — Z86718 Personal history of other venous thrombosis and embolism: Secondary | ICD-10-CM

## 2019-01-04 DIAGNOSIS — I5020 Unspecified systolic (congestive) heart failure: Secondary | ICD-10-CM

## 2019-01-04 DIAGNOSIS — I132 Hypertensive heart and chronic kidney disease with heart failure and with stage 5 chronic kidney disease, or end stage renal disease: Secondary | ICD-10-CM

## 2019-01-04 DIAGNOSIS — E785 Hyperlipidemia, unspecified: Secondary | ICD-10-CM

## 2019-01-04 DIAGNOSIS — Z79899 Other long term (current) drug therapy: Secondary | ICD-10-CM

## 2019-01-04 DIAGNOSIS — Z7901 Long term (current) use of anticoagulants: Secondary | ICD-10-CM

## 2019-01-04 DIAGNOSIS — Z20828 Contact with and (suspected) exposure to other viral communicable diseases: Secondary | ICD-10-CM

## 2019-01-04 DIAGNOSIS — Z9861 Coronary angioplasty status: Secondary | ICD-10-CM

## 2019-01-04 DIAGNOSIS — B889 Infestation, unspecified: Secondary | ICD-10-CM

## 2019-01-04 DIAGNOSIS — E1152 Type 2 diabetes mellitus with diabetic peripheral angiopathy with gangrene: Principal | ICD-10-CM

## 2019-01-04 DIAGNOSIS — J45909 Unspecified asthma, uncomplicated: Secondary | ICD-10-CM

## 2019-01-04 DIAGNOSIS — L03115 Cellulitis of right lower limb: Secondary | ICD-10-CM

## 2019-01-04 DIAGNOSIS — Z8673 Personal history of transient ischemic attack (TIA), and cerebral infarction without residual deficits: Secondary | ICD-10-CM

## 2019-01-04 LAB — CBC
HCT: 32.9 % — ABNORMAL LOW (ref 36.0–46.0)
Hemoglobin: 10.4 g/dL — ABNORMAL LOW (ref 12.0–15.0)
MCH: 31.8 pg (ref 26.0–34.0)
MCHC: 31.6 g/dL (ref 30.0–36.0)
MCV: 100.6 fL — ABNORMAL HIGH (ref 80.0–100.0)
Platelets: 501 10*3/uL — ABNORMAL HIGH (ref 150–400)
RBC: 3.27 MIL/uL — ABNORMAL LOW (ref 3.87–5.11)
RDW: 14.5 % (ref 11.5–15.5)
WBC: 10.6 10*3/uL — ABNORMAL HIGH (ref 4.0–10.5)
nRBC: 0 % (ref 0.0–0.2)

## 2019-01-04 LAB — RENAL FUNCTION PANEL
Albumin: 2.5 g/dL — ABNORMAL LOW (ref 3.5–5.0)
Anion gap: 13 (ref 5–15)
BUN: 11 mg/dL (ref 8–23)
CO2: 25 mmol/L (ref 22–32)
Calcium: 9.1 mg/dL (ref 8.9–10.3)
Chloride: 98 mmol/L (ref 98–111)
Creatinine, Ser: 2.49 mg/dL — ABNORMAL HIGH (ref 0.44–1.00)
GFR calc Af Amer: 23 mL/min — ABNORMAL LOW (ref >60)
GFR calc non Af Amer: 20 mL/min — ABNORMAL LOW (ref >60)
Glucose, Bld: 98 mg/dL (ref 70–99)
Phosphorus: 2.6 mg/dL (ref 2.5–4.6)
Potassium: 3.5 mmol/L (ref 3.5–5.1)
Sodium: 136 mmol/L (ref 135–145)

## 2019-01-04 LAB — GLUCOSE, CAPILLARY
Glucose-Capillary: 156 mg/dL — ABNORMAL HIGH (ref 70–99)
Glucose-Capillary: 137 mg/dL — ABNORMAL HIGH (ref 70–99)
Glucose-Capillary: 127 mg/dL — ABNORMAL HIGH (ref 70–99)
Glucose-Capillary: 141 mg/dL — ABNORMAL HIGH (ref 70–99)

## 2019-01-04 LAB — APTT: aPTT: 73 seconds — ABNORMAL HIGH (ref 24–36)

## 2019-01-04 LAB — SURGICAL PCR SCREEN
MRSA, PCR: NEGATIVE
Staphylococcus aureus: NEGATIVE

## 2019-01-04 LAB — HEPARIN LEVEL (UNFRACTIONATED): Heparin Unfractionated: 0.9 IU/mL — ABNORMAL HIGH (ref 0.30–0.70)

## 2019-01-04 MED ORDER — CEFAZOLIN SODIUM-DEXTROSE 2-4 GM/100ML-% IV SOLN
2.0000 g | INTRAVENOUS | Status: DC
  Filled 2019-01-04: qty 100

## 2019-01-04 MED ORDER — CALCITRIOL 0.5 MCG PO CAPS
0.5000 ug | ORAL_CAPSULE | ORAL | Status: DC
  Filled 2019-01-04: qty 1

## 2019-01-04 MED ORDER — CHLORHEXIDINE GLUCONATE 4 % EX LIQD
60.0000 mL | Freq: Once | CUTANEOUS | Status: AC
  Administered 2019-01-05: 4 via TOPICAL
  Filled 2019-01-04: qty 60

## 2019-01-04 MED ORDER — HEPARIN (PORCINE) 25000 UT/250ML-% IV SOLN
1250.0000 [IU]/h | INTRAVENOUS | Status: DC
  Filled 2019-01-04: qty 250

## 2019-01-04 MED ORDER — PRO-STAT SUGAR FREE PO LIQD
30.0000 mL | Freq: Two times a day (BID) | ORAL | Status: DC
  Administered 2019-01-04: 30 mL via ORAL
  Filled 2019-01-04: qty 30

## 2019-01-04 NOTE — Progress Notes (Signed)
Family Medicine Teaching Service Daily Progress Note Intern Pager: [phone]  Patient name: Kristen Berry Medical record number: [id number] Date of birth: 12/15/1955 Age: 63 y.o. Gender: female  Primary Care Provider: [place], Sarah T, MD Consultants: Manson Passey, vascular surgery Code Status: full  Pt Overview and Major Events to Date:  4/7 admitted to fpts  Assessment and Plan: Kristen Berry is a 63 y.o. female presenting with worsening discoloration of right foot and cellulitis . PMH is significant forosteomyelitis of the left toes s/p amputation, ESRD, diabetes type 2, hypertension, history of DVT, history of stroke, CHF, MGUS  Worsening necrosis of right second toe and foot, right foot cellulitis Planned transtibial amputation on 4/10. Stopped antibiotics on 4/9, no increase in wbc or fevers. Continue heparin gtt for dvt ppx with upcoming sx. NPO at midnight. - vital signs per floor routine - continue renal diet, NPO at midnight - Continue heparin gtt - hold brillinta and aspirin, restart 24 hours post op - Likely transtibial amp on 4/10 - tylenol 1g tid for pain  ESRD on [facility] Dialysis on 4/8. Likely dialyze again on 4/10. Appreciate nephro recs. phoslo with meals. Daily renal function panel.  CoViD-19 exposure Exposed to + hospital personnel during last admission. Per ID pt to be droplet and contact precaution x2 weeks. If asymptomatic in until 4/15, will lift these precautions.  Bed Bugs Patient with bed bugs discovered on her this am. Contact precautions, sanitize room when able  Diabetes mellitus type 2 5.5 on 12/22/2018.  - cbg ac hs - SSI can be added if needed  MGUS Diagnosed in 2017. Patient not currently on any medication. [facility]  HFrEF January 2018 patient had echo that showed LVEF 35 to 40%. Septal and apical akinesis, moderately dilated LV, mild LVH. Patient not currently in respiratory distress. No signs of  acute worsening.  CAD left heart cath in February 2019 showed severe diffuse diabetic coronary disease, 70 to 80% stenosis of proximal LAD 75% obstruction of RCA  - hold brilliinta in the setting of likely surgical intervention  Mild asthma  since childhood. Hasn't usedher albuterolin months. No other medications. - prn albuterol inhaler  HTN PT takes amlodipine 5mg , Coreg 25mg  BID, hydralazine 25mg  BID at home. BP 132/52.  -continue home coreg, norvasc - add back hydral if BPs permit  HLD- pt takes 20mg  lipitor at home. - Continue Lipitor  History of left leg DVT patient takes Eliquis 2.5 mg BID for hx of DVT in left leg.  - hold eliquis - heparin gtt dosed per pharm in setting of likely surgical intervention  Hx of stroke -patient states she had a stroke May of last year. Was treated at [facility] in [place]. Does not appear to have any residual deficits. Patient takes Brilinta 90 mg twice daily.  -Hold Brilinta. Restart after surgery  FEN/GI: renal diet PPx: heparin gtt  Disposition: likely back to snf  Subjective:  Doing well this am. No complaints. Pain controlled.  Objective: Temp:  [97.3 F (36.3 C)-98.1 F (36.7 C)] 97.3 F (36.3 C) (04/09 0642) Pulse Rate:  [56-104] 71 (04/09 0642) Resp:  [16-20] 18 (04/09 0642) BP: (108-142)/(48-97) 114/71 (04/09 0642) SpO2:  [100 %] 100 % (04/09 0642) Weight:  [84.7 kg] 84.7 kg (04/08 1230) Physical Exam: General: 63 year old caucasian female, no acute distress, at edge of bedside Cardiovascular: regular rate and rhythm, no m/r/g. Thrill palpable in RUE. Stitch with plastic tubing directly overlying  this area. Respiratory: Lungs CTAB, no accessory muscle use Abdomen: soft, non-tender, non-distended Extremities: Right foot unchanged from previous exam. Still black eschar overlying toe amputation site and up anterior foot.  Laboratory: Recent Labs  Lab 01/02/19 1641 01/03/19 0535 01/04/19 0244   WBC 16.4* 11.1* 10.6*  HGB 9.9* 8.4* 10.4*  HCT 31.2* 25.7* 32.9*  PLT 524* 382 501*   Recent Labs  Lab 01/03/19 0535 01/03/19 1300 01/04/19 0244  NA 136 134* 136  K 2.8* 4.2 3.5  CL 105 99 98  CO2 21* 21* 25  BUN 28* 34* 11  CREATININE 3.03* 3.82* 2.49*  CALCIUM 7.7* 9.1 9.1  GLUCOSE 84 110* 98   Imaging/Diagnostic Tests: CLINICAL DATA:  Redness and swelling of the medial aspect of the right foot. 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  EXAM: RIGHT FOOT COMPLETE - 3+ VIEW  COMPARISON:  Radiographs dated 12/21/2018  FINDINGS: There is edema and a small amount of gas in the soft tissues at the site of the previous resection of the first metatarsal. No evidence of osteomyelitis of the remaining bones. Slight arthritis between navicular and medial cuneiform, stable. Chronic calcaneal enthesophytes. Chronic dorsal spurring in the midfoot.  IMPRESSION: 1. No evidence of osteomyelitis. 2. Edema and small amount of gas in the soft tissues at the site of the previous resection of the first metatarsal and great toe. This could be residual postsurgical edema and air. Infection is not excluded.   Electronically Signed   By: Lorriane Shire M.D.   On: 01/02/2019 16:20  [name], MD 01/04/2019, 9:19 AM PGY-2, [place] Intern pager: [phone], text pages welcome

## 2019-01-04 NOTE — TOC Initial Note (Signed)
Transition of Care [facility]) - Initial/Assessment Note    Patient Details  Name: Kristen Berry MRN: [id number] Date of Birth: 16-May-1956  [facility] CM/SW Contact:    Geralynn Ochs, LCSW Phone Number: 01/04/2019, 2:44 PM  Clinical Narrative:  Patient confirmed plan to return to [facility] when medically stable to continue rehab. Patient will need PT and OT evaluations after surgery is completed for insurance authorization to return to SNF.               Expected Discharge Plan: Skilled Nursing Facility Barriers to Discharge: Continued Medical Work up, Ship broker   Patient Goals and CMS Choice Patient states their goals for this hospitalization and ongoing recovery are:: get back to rehab CMS Medicare.gov Compare Post Acute Care list provided to:: Patient Choice offered to / list presented to : Patient  Expected Discharge Plan and Services Expected Discharge Plan: [place] Choice: [place] Living arrangements for the past 2 months: Mobile Home                          Prior Living Arrangements/Services Living arrangements for the past 2 months: Mobile Home Lives with:: Adult Children, Other (Comment) Patient language and need for interpreter reviewed:: No Do you feel safe going back to the place where you live?: Yes      Need for Family Participation in Patient Care: No (Comment) Care giver support system in place?: Yes (comment)   Criminal Activity/Legal Involvement Pertinent to Current Situation/Hospitalization: No - Comment as needed  Activities of Daily Living Home Assistive Devices/Equipment: Eyeglasses, Environmental consultant (specify type), Bedside commode/3-in-1 ADL Screening (condition at time of admission) Patient's cognitive ability adequate to safely complete daily activities?: Yes Is the patient deaf or have difficulty hearing?: No Does the patient have difficulty seeing, even when  wearing glasses/contacts?: No Does the patient have difficulty concentrating, remembering, or making decisions?: No Patient able to express need for assistance with ADLs?: Yes Does the patient have difficulty dressing or bathing?: No Independently performs ADLs?: Yes (appropriate for developmental age) Does the patient have difficulty walking or climbing stairs?: Yes Weakness of Legs: Both Weakness of Arms/Hands: None  Permission Sought/Granted Permission sought to share information with : Chartered certified accountant granted to share information with : Yes, Verbal Permission Granted     Permission granted to share info w AGENCY: [facility]        Emotional Assessment   Attitude/Demeanor/Rapport: Engaged Affect (typically observed): Pleasant Orientation: : Oriented to Self, Oriented to Place, Oriented to  Time, Oriented to Situation Alcohol / Substance Use: Not Applicable Psych Involvement: No (comment)  Admission diagnosis:  Right foot infection [L08.9] Patient Active Problem List   Diagnosis Date Noted  . ESRD on hemodialysis ([place])   . Coronary artery disease without angina pectoris   . Cellulitis 01/02/2019  . Gangrene of right foot ([place])   . Right foot infection 12/21/2018  . PAD (peripheral artery disease) ([place]) 01/24/2018  . Peripheral artery disease ([place]) 01/24/2018  . C. difficile diarrhea 01/06/2018  . Hypokalemia 01/06/2018  . ESRD (end stage renal disease) ([place]) 01/06/2018  . Prolonged QT interval 01/06/2018  . Hypotension 01/06/2018  . Multiple rib fractures 01/06/2018  . Pressure injury of skin 11/08/2017  . Coronary artery disease due to lipid rich plaque   . Chest pain   . Acute on chronic systolic CHF (congestive  heart failure), NYHA class 3 ([place]) 10/18/2017  . PICC (peripherally inserted central catheter) in place   . Acute osteomyelitis of toe of left foot ([place])   . ARF (acute renal failure) ([place]) 10/10/2017  . AKI (acute kidney  injury) ([place]) 10/10/2017  . Essential hypertension 12/14/2016  . Abnormal bone xray 06/15/2016  . MGUS (monoclonal gammopathy of unknown significance) 05/18/2016  . Stage 4 chronic kidney disease ([place]) 05/18/2016  . CHF (congestive heart failure) ([place]) 05/18/2016  . Renal insufficiency 06/28/2013  . Other and unspecified hyperlipidemia 06/28/2013  . Obesity, unspecified 06/28/2013  . Pain in limb 06/28/2013  . Diabetes mellitus type 2, insulin dependent ([place]) 06/28/2013   PCP:  [place], Sarah T, MD Pharmacy:   [facility] [address] [place] [address] Phone: [phone] Fax: [phone]     Social Determinants of Health (SDOH) Interventions    Readmission Risk Interventions No flowsheet data found.

## 2019-01-04 NOTE — Progress Notes (Signed)
ANTICOAGULATION CONSULT NOTE - Follow-up Consult  Pharmacy Consult for heparin Indication: bridge for h/o VTE for surgery  Allergies  Allergen Reactions  . Crestor [Rosuvastatin Calcium] Other (See Comments)    Leg pain    Patient Measurements: Height: 5\' 8"  (172.7 cm) Weight: 186 lb 11.7 oz (84.7 kg) IBW/kg (Calculated) : 63.9 Heparin Dosing Weight: 81kg  Vital Signs: Temp: 97.3 F (36.3 C) (04/09 0642) Temp Source: Oral (04/09 0642) BP: 114/71 (04/09 0642) Pulse Rate: 71 (04/09 0642)  Labs: Recent Labs    01/02/19 1641  01/02/19 2047 01/03/19 0535 01/03/19 1300 01/03/19 1433 01/04/19 0244  HGB 9.9*  --   --  8.4*  --   --  10.4*  HCT 31.2*  --   --  25.7*  --   --  32.9*  PLT 524*  --   --  382  --   --  501*  APTT  --    < > 60* 76*  --  82* 73*  HEPARINUNFRC  --   --  >2.20* 1.76*  --   --  0.90*  CREATININE 3.45*  --   --  3.03* 3.82*  --  2.49*   < > = values in this interval not displayed.    Estimated Creatinine Clearance: 26.4 mL/min (A) (by C-G formula based on SCr of 2.49 mg/dL (H)).   Medical History: Past Medical History:  Diagnosis Date  . Anemia   . Arthritis    "hands, knees" (10/10/2017)  . Asthma   . CHF (congestive heart failure) ([place])   . Chronic kidney disease (CKD), stage IV (severe) ([place])    Dialysis T/ Th/ Sat  . Coronary artery disease   . H/O Clostridium difficile infection   . High cholesterol   . History of kidney stones   . Hypertension   . PVD (peripheral vascular disease) ([place])    "LLE; will have OR" (10/10/2017)  . Sleep apnea    "never given mask" (10/10/2017)  . Stroke [facility])    mild stroke mid April - 2019  . 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 Patient also with h/o ESRD on HD. Admitted for worsening right foot wound. Patient to have procedure and apixaban held and heparin drip started. Patient on apixaban 2.5mg  BID PTA, however  patient unsure of last dose but thinks it might have been this AM (heparin level 4/7 PM >2.2)  4/9 AM: aPTT this morning is therapeutic at 73, continue current heparin rate. Hgb 10.4, pltc 501. Heparin level currently 0.9, still not correlating, will follow-up tomorrow morning.   Goal of Therapy:  APTT 66-102 sec Heparin level 0.3-0.7 units/ml Monitor platelets by anticoagulation protocol: Yes   Plan:  Continue heparin drip at 1250 units/hr Daily heparin level and aPTT Monitor for bleeding  Thank you for involving pharmacy in this patient's care.  Janae Bridgeman, PharmD PGY1 Pharmacy Resident Phone: [phone] 01/04/2019 7:55 AM

## 2019-01-04 NOTE — Progress Notes (Signed)
[facility] Progress Note   Subjective: No C/Os. R foots actually looks worse. Understandably frustrated with COVID 19 isolation.   Objective Vitals:   01/03/19 1648 01/03/19 1700 01/03/19 2030 01/04/19 0642  BP: (!) 140/55 126/61 (!) 140/56 114/71  Pulse: 72 74 81 71  Resp: 18  18 18   Temp: (!) 97.5 F (36.4 C)  98 F (36.7 C) (!) 97.3 F (36.3 C)  TempSrc: Oral  Oral Oral  SpO2: 100%  100% 100%  Weight:      Height:       Physical Exam General: Older female in NAD Heart: S1,S2 RRR Lungs: CTAB A/P Abdomen: active BS Extremities: R foot with blackened eschar along suture line. 1+ RLE. No edema LLE.  Dialysis Access: RIJ [facility] drsg CDI. L AVF faint bruit. Has suture in place at bottom of AVF from previous fistulagram. .    Additional Objective Labs: Basic Metabolic Panel: Recent Labs  Lab 12/31/18 0440  01/03/19 0535 01/03/19 1300 01/04/19 0244  NA 131*   < > 136 134* 136  K 3.9   < > 2.8* 4.2 3.5  CL 96*   < > 105 99 98  CO2 22   < > 21* 21* 25  GLUCOSE 90   < > 84 110* 98  BUN 26*   < > 28* 34* 11  CREATININE 3.55*   < > 3.03* 3.82* 2.49*  CALCIUM 9.1   < > 7.7* 9.1 9.1  PHOS 3.4  --   --  3.7 2.6   < > = values in this interval not displayed.   Liver Function Tests: Recent Labs  Lab 12/31/18 0440 01/03/19 1300 01/04/19 0244  ALBUMIN 2.8* 2.4* 2.5*   No results for input(s): LIPASE, AMYLASE in the last 168 hours. CBC: Recent Labs  Lab 12/30/18 0235 12/31/18 0440 01/02/19 1641 01/03/19 0535 01/04/19 0244  WBC 14.3* 14.1* 16.4* 11.1* 10.6*  NEUTROABS  --   --  12.8*  --   --   HGB 10.7* 10.5* 9.9* 8.4* 10.4*  HCT 33.0* 33.1* 31.2* 25.7* 32.9*  MCV 101.5* 101.5* 101.0* 101.6* 100.6*  PLT 426* 450* 524* 382 501*   Blood Culture    Component Value Date/Time   SDES BLOOD RIGHT FOREARM 01/02/2019 1641   SDES BLOOD RIGHT HAND 01/02/2019 1641   SPECREQUEST  01/02/2019 1641    BOTTLES DRAWN AEROBIC ONLY Blood Culture results may not  be optimal due to an inadequate volume of blood received in culture bottles   SPECREQUEST  01/02/2019 1641    BOTTLES DRAWN AEROBIC ONLY Blood Culture adequate volume   CULT  01/02/2019 1641    NO GROWTH 2 DAYS Performed at [facility], [place] [address], [place], [place] [address]    CULT  01/02/2019 1641    NO GROWTH 2 DAYS Performed at [facility], [place] [address] [place], [place] [address]    REPTSTATUS PENDING 01/02/2019 1641   REPTSTATUS PENDING 01/02/2019 1641    Cardiac Enzymes: No results for input(s): CKTOTAL, CKMB, CKMBINDEX, TROPONINI in the last 168 hours. CBG: Recent Labs  Lab 01/03/19 0800 01/03/19 1207 01/03/19 1729 01/03/19 2035 01/04/19 0808  GLUCAP 102* 118* 98 171* 156*   Iron Studies: No results for input(s): IRON, TIBC, TRANSFERRIN, FERRITIN in the last 72 hours. @lablastinr3 @ Studies/Results: Dg Foot Complete Right  Result Date: 01/02/2019 CLINICAL DATA:  Redness and swelling of the medial aspect of the right foot. Previous amputation of the hallux  and first metatarsal. EXAM: RIGHT FOOT COMPLETE - 3+ VIEW COMPARISON:  Radiographs dated 12/21/2018 FINDINGS: There is edema and a small amount of gas in the soft tissues at the site of the previous resection of the first metatarsal. No evidence of osteomyelitis of the remaining bones. Slight arthritis between navicular and medial cuneiform, stable. Chronic calcaneal enthesophytes. Chronic dorsal spurring in the midfoot. IMPRESSION: 1. No evidence of osteomyelitis. 2. Edema and small amount of gas in the soft tissues at the site of the previous resection of the first metatarsal and great toe. This could be residual postsurgical edema and air. Infection is not excluded. Electronically Signed   By: Lorriane Shire M.D.   On: 01/02/2019 16:20   Medications: . sodium chloride    . heparin 1,250 Units/hr (01/02/19 2238)   . amLODipine  5 mg Oral QPM  . atorvastatin  20 mg Oral q1800  . calcium acetate  667  mg Oral BID WC  . carvedilol  25 mg Oral BID  . Chlorhexidine Gluconate Cloth  6 each Topical Q0600  . darbepoetin (ARANESP) injection - DIALYSIS  100 mcg Intravenous Q Wed-HD  . gabapentin  100 mg Oral QHS  . multivitamin  1 tablet Oral QHS  . pantoprazole  40 mg Oral QPM  . saccharomyces boulardii  250 mg Oral BID     HD orders: [facility] MWF 81.5 kg 180NRe 400/800 3.0 K/2.25 Ca 4 hrs RIJ TDC  -Heparin 2400 units IV TIW -Calcitriol 0.75 mcg PO TIW  Assessment/Plan: 1. 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 Plan for transtibial amp 01/05/19. Rec'd vanc and cefepime. WBC 16.4 on adm 10.6 today. Now off ABX. BC NGTD per primary.  2. Hypokalemia: K+ 2.8 40 MEQ KDUR PO today. Use 4.0 K bath with HD. Recheck K+ this evening.  3. ESRD -Now on MWF. HD 01/05/19 No heparin. Must be done as separate. K+ 3.5 Use 4.0 K bath. Coordinate HD with surgery schedule.  4. Anemia -HGB 10.4. No recent ESA. Rec'd Aranesp 100 mcg IV 01/03/2019. Follow HGB.  5. Secondary hyperparathyroidism - Ca 9.1 C Ca 10.2 Phos 2.6. Hold binders. Decrease VDRA to 0.5 mcg PO. Follow labs.  6. HTN/volume -BP controlled, higher today. HD yesterday-Pre wt 84.7 Net UF 2.5 L Post wt not done! Checked wt today-81.5 kg. No evidence of volume excess. RLE edema related to wound.  7. Nutrition - albumin 2.5.  renal diet. prostat Nepro. Renal vits.  8. Known COVID 19 exposure-Tested negative 12/27/2018. No symptoms. On droplet precautions. Has been on 14 day isolation which ends 01/09/19.  9.Active bed bug infestation. Notified primary. Will have HD as separate to prevent spread.  10. H/O chronic systolic HF EF 57-90% 11. H/O CAD-ASA,BB, Eliquis  Kristen Berry H. Priyansh Pry NP-C 01/04/2019, 9:59 AM  [employer] [phone]

## 2019-01-04 NOTE — NC FL2 (Signed)
[place] MEDICAID FL2 LEVEL OF CARE SCREENING TOOL     IDENTIFICATION  Patient Name: Kristen Berry Birthdate: July 26, 1956 Sex: female Admission Date (Current Location): 01/02/2019  [place] and [place] Number:  Kristen Berry [id number] Kristen Berry and Address:  [facility] [place], [place] [address], [place], [place] [address]      Provider Number: [id number]  Attending Physician Name and Address:  Kristen Covert, MD  Relative Name and Phone Number:  Berry,Kristen Son -  [phone] (h), [phone] (mobile)     Current Level of Care: Hospital Recommended Level of Care: [place] Prior Approval Number:    Date Approved/Denied:   PASRR Number: [id number] Berry(Eff. 12/26/18 - MUST DG#[id number])  Discharge Plan: SNF    Current Diagnoses: Patient Active Problem List   Diagnosis Date Noted  . ESRD on hemodialysis ([place])   . Coronary artery disease without angina pectoris   . Cellulitis 01/02/2019  . Gangrene of right foot ([place])   . Right foot infection 12/21/2018  . PAD (peripheral artery disease) ([place]) 01/24/2018  . Peripheral artery disease ([place]) 01/24/2018  . C. difficile diarrhea 01/06/2018  . Hypokalemia 01/06/2018  . ESRD (end stage renal disease) ([place]) 01/06/2018  . Prolonged QT interval 01/06/2018  . Hypotension 01/06/2018  . Multiple rib fractures 01/06/2018  . Pressure injury of skin 11/08/2017  . Coronary artery disease due to lipid rich plaque   . Chest pain   . Acute on chronic systolic CHF (congestive heart failure), NYHA class 3 ([place]) 10/18/2017  . PICC (peripherally inserted central catheter) in place   . Acute osteomyelitis of toe of left foot ([place])   . ARF (acute renal failure) ([place]) 10/10/2017  . AKI (acute kidney injury) ([place]) 10/10/2017  . Essential hypertension 12/14/2016  . Abnormal bone xray 06/15/2016  . MGUS (monoclonal gammopathy of unknown significance) 05/18/2016  . Stage 4 chronic kidney disease ([place]) 05/18/2016  .  CHF (congestive heart failure) ([place]) 05/18/2016  . Renal insufficiency 06/28/2013  . Other and unspecified hyperlipidemia 06/28/2013  . Obesity, unspecified 06/28/2013  . Pain in limb 06/28/2013  . Diabetes mellitus type 2, insulin dependent ([place]) 06/28/2013    Orientation RESPIRATION BLADDER Height & Weight     Self, Time, Situation, Place  Normal Continent Weight: 179 lb 10.8 oz (81.5 kg) Height:  5\' 8"  (172.7 cm)  BEHAVIORAL SYMPTOMS/MOOD NEUROLOGICAL BOWEL NUTRITION STATUS      Continent Diet  AMBULATORY STATUS COMMUNICATION OF NEEDS Skin   Extensive Assist Verbally Other (Comment), Surgical wounds, Wound Vac                       Personal Care Assistance Level of Assistance  Bathing, Feeding, Dressing Bathing Assistance: Maximum assistance Feeding assistance: Independent Dressing Assistance: Independent     Functional Limitations Info  Sight, Hearing, Speech Sight Info: Impaired Hearing Info: Adequate Speech Info: Adequate    SPECIAL CARE FACTORS FREQUENCY  PT (By licensed PT), OT (By licensed OT)     PT Frequency: 5x week OT Frequency: 5x week            Contractures Contractures Info: Not present    Additional Factors Info  Code Status, Allergies, Insulin Sliding Scale, Isolation Precautions Code Status Info: DNR Allergies Info: Crestor   Insulin Sliding Scale Info: 0-9 units 3x day with meals Isolation Precautions Info: droplet and contact precautions     Current Medications (01/04/2019):  This is the current hospital active medication list Current Facility-Administered Medications  Medication Dose Route Frequency Provider Last Rate Last Dose  . 0.9 %  sodium chloride infusion  100 mL Intravenous PRN Kristen Gu, NP      . acetaminophen (TYLENOL) tablet 1,000 mg  1,000 mg Oral TID PRN Kristen Coombe, MD   1,000 mg at 01/04/19 0702  . albuterol (PROVENTIL HFA;VENTOLIN HFA) 108 (90 Base) MCG/ACT inhaler 1-2 puff  1-2 puff Inhalation Q6H PRN  Berry, Kristen L, MD      . alteplase (CATHFLO ACTIVASE) injection 2 mg  2 mg Intracatheter Once PRN Kristen Coombe, MD      . amLODipine (NORVASC) tablet 5 mg  5 mg Oral QPM Kristen Coombe, MD   5 mg at 01/03/19 1740  . atorvastatin (LIPITOR) tablet 20 mg  20 mg Oral q1800 Kristen Coombe, MD   20 mg at 01/03/19 1740  . [START ON 01/05/2019] calcitRIOL (ROCALTROL) capsule 0.5 mcg  0.5 mcg Oral Q M,W,F-HD Kristen Gu, NP      . carvedilol (COREG) tablet 25 mg  25 mg Oral BID Kristen Coombe, MD   25 mg at 01/04/19 0906  . Chlorhexidine Gluconate Cloth 2 % PADS 6 each  6 each Topical Q0600 Kristen Gu, NP   6 each at 01/04/19 [phone]  . Darbepoetin Alfa (ARANESP) injection 100 mcg  100 mcg Intravenous Q Wed-HD Kristen Gu, NP   100 mcg at 01/03/19 1434  . feeding supplement (PRO-STAT SUGAR FREE 64) liquid 30 mL  30 mL Oral BID Kristen Gu, NP   30 mL at 01/04/19 1120  . gabapentin (NEURONTIN) capsule 100 mg  100 mg Oral QHS Kristen Coombe, MD   100 mg at 01/03/19 2039  . heparin ADULT infusion 100 units/mL (25000 units/251mL sodium chloride 0.45%)  1,250 Units/hr Intravenous Continuous Kristen Berry, RPH 12.5 mL/hr at 01/02/19 2238 1,250 Units/hr at 01/02/19 2238  . multivitamin (RENA-VIT) tablet 1 tablet  1 tablet Oral QHS Kristen Coombe, MD   1 tablet at 01/03/19 2039  . pantoprazole (PROTONIX) EC tablet 40 mg  40 mg Oral QPM Kristen Coombe, MD   40 mg at 01/03/19 1740  . polyethylene glycol (MIRALAX / GLYCOLAX) packet 17 g  17 g Oral Daily PRN Kristen Coombe, MD      . saccharomyces boulardii (FLORASTOR) capsule 250 mg  250 mg Oral BID Kristen Coombe, MD   250 mg at 01/04/19 0906     Discharge Medications: Please see discharge summary for Berry list of discharge medications.  Relevant Imaging Results:  Relevant Lab Results:   Additional Information ss#[id number]. Dialysis patient TTS [facility].  Kristen Ochs, LCSW

## 2019-01-05 ENCOUNTER — Inpatient Hospital Stay (HOSPITAL_COMMUNITY): Payer: Medicare HMO | Admitting: Certified Registered"

## 2019-01-05 ENCOUNTER — Encounter (HOSPITAL_COMMUNITY): Payer: Self-pay | Admitting: Certified Registered"

## 2019-01-05 ENCOUNTER — Encounter (HOSPITAL_COMMUNITY)
Admission: EM | Disposition: A | Discharge status: Skilled Nursing Facility | Payer: Self-pay | Source: Home / Self Care | Attending: Orthopedic Surgery

## 2019-01-05 DIAGNOSIS — Z89511 Acquired absence of right leg below knee: Secondary | ICD-10-CM

## 2019-01-05 HISTORY — PX: APPLICATION OF WOUND VAC: SHX5189

## 2019-01-05 HISTORY — PX: AMPUTATION: SHX166

## 2019-01-05 LAB — PROTIME-INR
INR: 1.3 — ABNORMAL HIGH (ref 0.8–1.2)
Prothrombin Time: 15.7 seconds — ABNORMAL HIGH (ref 11.4–15.2)

## 2019-01-05 LAB — GLUCOSE, CAPILLARY
Glucose-Capillary: 100 mg/dL — ABNORMAL HIGH (ref 70–99)
Glucose-Capillary: 113 mg/dL — ABNORMAL HIGH (ref 70–99)
Glucose-Capillary: 136 mg/dL — ABNORMAL HIGH (ref 70–99)
Glucose-Capillary: 155 mg/dL — ABNORMAL HIGH (ref 70–99)

## 2019-01-05 LAB — RENAL FUNCTION PANEL
Albumin: 2.6 g/dL — ABNORMAL LOW (ref 3.5–5.0)
Anion gap: 12 (ref 5–15)
BUN: 23 mg/dL (ref 8–23)
CO2: 24 mmol/L (ref 22–32)
Calcium: 9.3 mg/dL (ref 8.9–10.3)
Chloride: 99 mmol/L (ref 98–111)
Creatinine, Ser: 3.45 mg/dL — ABNORMAL HIGH (ref 0.44–1.00)
GFR calc Af Amer: 16 mL/min — ABNORMAL LOW (ref >60)
GFR calc non Af Amer: 13 mL/min — ABNORMAL LOW (ref >60)
Glucose, Bld: 93 mg/dL (ref 70–99)
Phosphorus: 3.1 mg/dL (ref 2.5–4.6)
Potassium: 3.5 mmol/L (ref 3.5–5.1)
Sodium: 135 mmol/L (ref 135–145)

## 2019-01-05 LAB — CBC
HCT: 29.9 % — ABNORMAL LOW (ref 36.0–46.0)
Hemoglobin: 9.5 g/dL — ABNORMAL LOW (ref 12.0–15.0)
MCH: 32.2 pg (ref 26.0–34.0)
MCHC: 31.8 g/dL (ref 30.0–36.0)
MCV: 101.4 fL — ABNORMAL HIGH (ref 80.0–100.0)
Platelets: 567 10*3/uL — ABNORMAL HIGH (ref 150–400)
RBC: 2.95 MIL/uL — ABNORMAL LOW (ref 3.87–5.11)
RDW: 14.8 % (ref 11.5–15.5)
WBC: 13 10*3/uL — ABNORMAL HIGH (ref 4.0–10.5)
nRBC: 0.2 % (ref 0.0–0.2)

## 2019-01-05 LAB — POCT I-STAT 4, (NA,K, GLUC, HGB,HCT)
Glucose, Bld: 98 mg/dL (ref 70–99)
HCT: 34 % — ABNORMAL LOW (ref 36.0–46.0)
Hemoglobin: 11.6 g/dL — ABNORMAL LOW (ref 12.0–15.0)
Potassium: 3.7 mmol/L (ref 3.5–5.1)
Sodium: 135 mmol/L (ref 135–145)

## 2019-01-05 LAB — HEPARIN LEVEL (UNFRACTIONATED): Heparin Unfractionated: 0.55 IU/mL (ref 0.30–0.70)

## 2019-01-05 LAB — APTT: aPTT: 54 seconds — ABNORMAL HIGH (ref 24–36)

## 2019-01-05 SURGERY — AMPUTATION BELOW KNEE
Anesthesia: General | Site: Leg Lower | Laterality: Right

## 2019-01-05 MED ORDER — INSULIN ASPART 100 UNIT/ML ~~LOC~~ SOLN
0.0000 [IU] | Freq: Three times a day (TID) | SUBCUTANEOUS | Status: DC
  Administered 2019-01-05 – 2019-01-06 (×2): 3 [IU] via SUBCUTANEOUS
  Administered 2019-01-06: 2 [IU] via SUBCUTANEOUS
  Administered 2019-01-07 – 2019-01-08 (×3): 1 [IU] via SUBCUTANEOUS

## 2019-01-05 MED ORDER — ACETAMINOPHEN 10 MG/ML IV SOLN
INTRAVENOUS | Status: AC
  Filled 2019-01-05: qty 100

## 2019-01-05 MED ORDER — APIXABAN 2.5 MG PO TABS
2.5000 mg | ORAL_TABLET | Freq: Two times a day (BID) | ORAL | Status: DC
  Administered 2019-01-06 – 2019-01-10 (×9): 2.5 mg via ORAL
  Filled 2019-01-05 (×10): qty 1

## 2019-01-05 MED ORDER — DEXAMETHASONE SODIUM PHOSPHATE 10 MG/ML IJ SOLN
INTRAMUSCULAR | Status: DC | PRN
  Administered 2019-01-05: 10 mg via INTRAVENOUS

## 2019-01-05 MED ORDER — OXYCODONE HCL 5 MG PO TABS
ORAL_TABLET | ORAL | Status: AC
  Filled 2019-01-05: qty 1

## 2019-01-05 MED ORDER — PROPOFOL 10 MG/ML IV BOLUS
INTRAVENOUS | Status: DC | PRN
  Administered 2019-01-05: 170 mg via INTRAVENOUS

## 2019-01-05 MED ORDER — ACETAMINOPHEN 325 MG PO TABS
325.0000 mg | ORAL_TABLET | Freq: Four times a day (QID) | ORAL | Status: DC | PRN
  Administered 2019-01-06: 650 mg via ORAL
  Filled 2019-01-05: qty 2

## 2019-01-05 MED ORDER — MIDAZOLAM HCL 2 MG/2ML IJ SOLN
INTRAMUSCULAR | Status: AC
  Filled 2019-01-05: qty 2

## 2019-01-05 MED ORDER — ACETAMINOPHEN 10 MG/ML IV SOLN
1000.0000 mg | Freq: Once | INTRAVENOUS | Status: DC | PRN
  Administered 2019-01-05: 1000 mg via INTRAVENOUS

## 2019-01-05 MED ORDER — MIDAZOLAM HCL 2 MG/2ML IJ SOLN
INTRAMUSCULAR | Status: DC | PRN
  Administered 2019-01-05: 2 mg via INTRAVENOUS

## 2019-01-05 MED ORDER — TICAGRELOR 90 MG PO TABS
90.0000 mg | ORAL_TABLET | Freq: Two times a day (BID) | ORAL | Status: DC
  Administered 2019-01-06 – 2019-01-10 (×9): 90 mg via ORAL
  Filled 2019-01-05 (×10): qty 1

## 2019-01-05 MED ORDER — HYDROMORPHONE HCL 1 MG/ML IJ SOLN
INTRAMUSCULAR | Status: AC
  Filled 2019-01-05: qty 1

## 2019-01-05 MED ORDER — OXYCODONE HCL 5 MG PO TABS
ORAL_TABLET | ORAL | Status: AC
  Filled 2019-01-05: qty 3

## 2019-01-05 MED ORDER — LIDOCAINE 2% (20 MG/ML) 5 ML SYRINGE
INTRAMUSCULAR | Status: DC | PRN
  Administered 2019-01-05: 60 mg via INTRAVENOUS

## 2019-01-05 MED ORDER — OXYCODONE HCL 5 MG PO TABS
10.0000 mg | ORAL_TABLET | ORAL | Status: DC | PRN
  Administered 2019-01-05 – 2019-01-06 (×5): 10 mg via ORAL
  Administered 2019-01-07: 15 mg via ORAL
  Filled 2019-01-05: qty 3

## 2019-01-05 MED ORDER — ONDANSETRON HCL 4 MG/2ML IJ SOLN
INTRAMUSCULAR | Status: DC | PRN
  Administered 2019-01-05: 4 mg via INTRAVENOUS

## 2019-01-05 MED ORDER — DEXAMETHASONE SODIUM PHOSPHATE 4 MG/ML IJ SOLN: 8.0000 mg | Freq: Once | INTRAMUSCULAR | Status: DC | PRN

## 2019-01-05 MED ORDER — SUCCINYLCHOLINE CHLORIDE 200 MG/10ML IV SOSY
PREFILLED_SYRINGE | INTRAVENOUS | Status: DC | PRN
  Administered 2019-01-05: 120 mg via INTRAVENOUS

## 2019-01-05 MED ORDER — HYDROMORPHONE HCL 1 MG/ML IJ SOLN
0.2500 mg | INTRAMUSCULAR | Status: DC | PRN
  Administered 2019-01-05 (×4): 0.5 mg via INTRAVENOUS

## 2019-01-05 MED ORDER — MEPERIDINE HCL 50 MG/ML IJ SOLN: 6.2500 mg | INTRAMUSCULAR | Status: DC | PRN

## 2019-01-05 MED ORDER — FENTANYL CITRATE (PF) 250 MCG/5ML IJ SOLN
INTRAMUSCULAR | Status: AC
  Filled 2019-01-05: qty 5

## 2019-01-05 MED ORDER — ACETAMINOPHEN 500 MG PO TABS
1000.0000 mg | ORAL_TABLET | Freq: Four times a day (QID) | ORAL | Status: AC
  Administered 2019-01-05 – 2019-01-06 (×4): 1000 mg via ORAL
  Filled 2019-01-05 (×4): qty 2

## 2019-01-05 MED ORDER — HEPARIN SODIUM (PORCINE) 1000 UNIT/ML IJ SOLN
INTRAMUSCULAR | Status: AC
  Administered 2019-01-05: 3200 [IU]
  Filled 2019-01-05: qty 3

## 2019-01-05 MED ORDER — DOCUSATE SODIUM 100 MG PO CAPS
100.0000 mg | ORAL_CAPSULE | Freq: Two times a day (BID) | ORAL | Status: DC
  Administered 2019-01-05 – 2019-01-10 (×10): 100 mg via ORAL
  Filled 2019-01-05 (×10): qty 1

## 2019-01-05 MED ORDER — CALCITRIOL 0.5 MCG PO CAPS
ORAL_CAPSULE | ORAL | Status: AC
  Filled 2019-01-05: qty 1

## 2019-01-05 MED ORDER — PROPOFOL 10 MG/ML IV BOLUS
INTRAVENOUS | Status: AC
  Filled 2019-01-05: qty 20

## 2019-01-05 MED ORDER — 0.9 % SODIUM CHLORIDE (POUR BTL) OPTIME
TOPICAL | Status: DC | PRN
  Administered 2019-01-05: 1000 mL

## 2019-01-05 MED ORDER — FENTANYL CITRATE (PF) 250 MCG/5ML IJ SOLN
INTRAMUSCULAR | Status: DC | PRN
  Administered 2019-01-05: 100 ug via INTRAVENOUS

## 2019-01-05 MED ORDER — CEFAZOLIN SODIUM-DEXTROSE 2-3 GM-%(50ML) IV SOLR
INTRAVENOUS | Status: DC | PRN
  Administered 2019-01-05: 2 g via INTRAVENOUS

## 2019-01-05 MED ORDER — OXYCODONE HCL 5 MG PO TABS
5.0000 mg | ORAL_TABLET | ORAL | Status: DC | PRN
  Administered 2019-01-05: 10 mg via ORAL
  Filled 2019-01-05 (×5): qty 2

## 2019-01-05 MED ORDER — HYDROMORPHONE HCL 1 MG/ML IJ SOLN
0.5000 mg | INTRAMUSCULAR | Status: DC | PRN
  Administered 2019-01-06: 1 mg via INTRAVENOUS
  Filled 2019-01-05: qty 1

## 2019-01-05 SURGICAL SUPPLY — 36 items
BLADE SAW RECIP 87.9 MT (BLADE) ×2 IMPLANT
BLADE SURG 21 STRL SS (BLADE) ×2 IMPLANT
BNDG COHESIVE 6X5 TAN STRL LF (GAUZE/BANDAGES/DRESSINGS) ×1 IMPLANT
CANISTER WOUND CARE 500ML ATS (WOUND CARE) ×2 IMPLANT
COVER SURGICAL LIGHT HANDLE (MISCELLANEOUS) ×2 IMPLANT
COVER WAND RF STERILE (DRAPES) IMPLANT
CUFF TOURNIQUET SINGLE 34IN LL (TOURNIQUET CUFF) ×2 IMPLANT
DRAPE INCISE IOBAN 66X45 STRL (DRAPES) ×2 IMPLANT
DRAPE U-SHAPE 47X51 STRL (DRAPES) ×2 IMPLANT
DRESSING PREVENA PLUS CUSTOM (GAUZE/BANDAGES/DRESSINGS) ×1 IMPLANT
DRSG PREVENA PLUS CUSTOM (GAUZE/BANDAGES/DRESSINGS) ×2
DURAPREP 26ML APPLICATOR (WOUND CARE) ×1 IMPLANT
ELECT REM PT RETURN 9FT ADLT (ELECTROSURGICAL) ×2
ELECTRODE REM PT RTRN 9FT ADLT (ELECTROSURGICAL) ×1 IMPLANT
GLOVE BIOGEL PI IND STRL 9 (GLOVE) ×1 IMPLANT
GLOVE BIOGEL PI INDICATOR 9 (GLOVE) ×1
GLOVE SURG ORTHO 9.0 STRL STRW (GLOVE) ×2 IMPLANT
GOWN STRL REUS W/ TWL XL LVL3 (GOWN DISPOSABLE) ×2 IMPLANT
GOWN STRL REUS W/TWL XL LVL3 (GOWN DISPOSABLE) ×2
KIT BASIN OR (CUSTOM PROCEDURE TRAY) ×2 IMPLANT
KIT TURNOVER KIT B (KITS) ×2 IMPLANT
MANIFOLD NEPTUNE II (INSTRUMENTS) ×2 IMPLANT
NS IRRIG 1000ML POUR BTL (IV SOLUTION) ×2 IMPLANT
PACK ORTHO EXTREMITY (CUSTOM PROCEDURE TRAY) ×2 IMPLANT
PAD ARMBOARD 7.5X6 YLW CONV (MISCELLANEOUS) ×2 IMPLANT
PREVENA RESTOR ARTHOFORM 46X30 (CANNISTER) ×2 IMPLANT
SPONGE LAP 18X18 RF (DISPOSABLE) IMPLANT
STAPLER VISISTAT 35W (STAPLE) IMPLANT
STOCKINETTE IMPERVIOUS LG (DRAPES) ×2 IMPLANT
SUT ETHILON 2 0 PSLX (SUTURE) ×2 IMPLANT
SUT SILK 2 0 (SUTURE) ×1
SUT SILK 2-0 18XBRD TIE 12 (SUTURE) ×1 IMPLANT
SUT VIC AB 1 CTX 27 (SUTURE) ×4 IMPLANT
TOWEL OR 17X26 10 PK STRL BLUE (TOWEL DISPOSABLE) ×2 IMPLANT
TUBE CONNECTING 12X1/4 (SUCTIONS) ×2 IMPLANT
YANKAUER SUCT BULB TIP NO VENT (SUCTIONS) ×2 IMPLANT

## 2019-01-05 NOTE — Op Note (Signed)
01/02/2019 - 01/05/2019  1:10 PM  PATIENT:  Kristen Berry    PRE-OPERATIVE DIAGNOSIS:  Gangrene Right Foot  POST-OPERATIVE DIAGNOSIS:  Same  PROCEDURE:  RIGHT BELOW KNEE AMPUTATION, Application Of Wound Vac  SURGEON:  Newt Minion, MD  PHYSICIAN ASSISTANT:None ANESTHESIA:   General  PREOPERATIVE INDICATIONS:  [name] is a  63 y.o. female with a diagnosis of Gangrene Right Foot who failed conservative measures and elected for surgical management.    The risks benefits and alternatives were discussed with the patient preoperatively including but not limited to the risks of infection, bleeding, nerve injury, cardiopulmonary complications, the need for revision surgery, among others, and the patient was willing to proceed.  OPERATIVE IMPLANTS: Praveena customizable and restore wound VAC  @ENCIMAGES @  OPERATIVE FINDINGS: Good color contractility for the muscle.  Patient had progressive gangrenous changes with a black eschar over the heel approximately 5 cm in diameter eschar laterally over the fifth metatarsal and dry gangrenous changes over the first ray amputation.  Wagner grade 5 ulceration.  OPERATIVE PROCEDURE: Patient was brought the operating room and underwent a general anesthetic.  After adequate levels anesthesia were obtained patient's right lower extremity was prepped using DuraPrep draped into a sterile field a timeout was called.  A transverse incision was made 11 cm distal to the tibial tubercle this curved proximally and a large posterior flap was created.  The anterior tibial vessels were clamped and suture ligated with 2-0 silk.  The tibia was resected 1 cm proximal to the skin incision the fibula resected just proximal to the tibial incision the tibia was beveled anteriorly.  Dissection was carried down to the popliteal vessels.  The sciatic nerve was pulled cut and allowed to retract the vessels were clamped and the amputation was completed.  The popliteal vessels and  the anterior tibial vessels were suture ligated with 2-0 silk hemostasis was obtained the wound was irrigated with normal saline the deep and superficial fascia layers were closed using #1 Vicryl skin was closed using staples the restore and Praveena wound VAC was applied this had a good suction fit.  Patient was extubated taken the PACU in stable condition.   DISCHARGE PLANNING:  Antibiotic duration: 24-hour postoperative antibiotics  Weightbearing: Nonweightbearing on the right  Pain medication: Opioid pathway  Dressing care/ Wound VAC: Continue wound VAC after discharge  Ambulatory devices: Walker.  Discharge to: Skilled nursing or inpatient rehab  Follow-up: In the office 1 week post operative.

## 2019-01-05 NOTE — Plan of Care (Signed)
  Problem: Education: Goal: Knowledge of General Education information will improve Description Including pain rating scale, medication(s)/side effects and non-pharmacologic comfort measures Outcome: Progressing   Problem: Clinical Measurements: Goal: Respiratory complications will improve Outcome: Not Applicable Goal: Cardiovascular complication will be avoided Outcome: Progressing   Problem: Pain Managment: Goal: General experience of comfort will improve Outcome: Progressing   Problem: Safety: Goal: Ability to remain free from injury will improve Outcome: Progressing

## 2019-01-05 NOTE — Plan of Care (Signed)
  Problem: Education: Goal: Knowledge of General Education information will improve Description: Including pain rating scale, medication(s)/side effects and non-pharmacologic comfort measures Outcome: Progressing   Problem: Health Behavior/Discharge Planning: Goal: Ability to manage health-related needs will improve Outcome: Progressing   Problem: Clinical Measurements: Goal: Ability to maintain clinical measurements within normal limits will improve Outcome: Progressing Goal: Will remain free from infection Outcome: Progressing Goal: Diagnostic test results will improve Outcome: Progressing Goal: Cardiovascular complication will be avoided Outcome: Progressing   Problem: Activity: Goal: Risk for activity intolerance will decrease Outcome: Progressing   Problem: Nutrition: Goal: Adequate nutrition will be maintained Outcome: Progressing   Problem: Coping: Goal: Level of anxiety will decrease Outcome: Progressing   Problem: Elimination: Goal: Will not experience complications related to bowel motility Outcome: Progressing Goal: Will not experience complications related to urinary retention Outcome: Progressing   Problem: Pain Managment: Goal: General experience of comfort will improve Outcome: Progressing   Problem: Safety: Goal: Ability to remain free from injury will improve Outcome: Progressing   

## 2019-01-05 NOTE — Anesthesia Postprocedure Evaluation (Signed)
Anesthesia Post Note  Patient: Kristen Berry  Procedure(s) Performed: RIGHT BELOW KNEE AMPUTATION (Right Leg Lower) Application Of Wound Vac (Right Leg Lower)     Patient location during evaluation: PACU Anesthesia Type: General Level of consciousness: awake and sedated Pain management: pain level controlled Vital Signs Assessment: post-procedure vital signs reviewed and stable Respiratory status: spontaneous breathing Cardiovascular status: stable Postop Assessment: no apparent nausea or vomiting Anesthetic complications: no    Last Vitals:  Vitals:   01/05/19 1356 01/05/19 1413  BP:  (!) 113/46  Pulse: 65 77  Resp: 16 18  Temp:  36.6 C  SpO2: 95% 100%    Last Pain:  Vitals:   01/05/19 1413  TempSrc: Oral  PainSc:    Pain Goal: Patients Stated Pain Goal: 3 (01/03/19 2037)                 Huston Foley

## 2019-01-05 NOTE — Anesthesia Procedure Notes (Signed)
Procedure Name: Intubation Date/Time: 01/05/2019 12:58 PM Performed by: Teressa Lower., CRNA Pre-anesthesia Checklist: Patient identified, Emergency Drugs available, Suction available and Patient being monitored Patient Re-evaluated:Patient Re-evaluated prior to induction Oxygen Delivery Method: Circle system utilized Preoxygenation: Pre-oxygenation with 100% oxygen Induction Type: IV induction and Rapid sequence Laryngoscope Size: Mac and 3 Grade View: Grade II Tube type: Oral Tube size: 7.0 mm Number of attempts: 1 Airway Equipment and Method: Stylet Placement Confirmation: ETT inserted through vocal cords under direct vision,  positive ETCO2 and breath sounds checked- equal and bilateral Secured at: 21 cm Tube secured with: Tape Dental Injury: Teeth and Oropharynx as per pre-operative assessment

## 2019-01-05 NOTE — Anesthesia Preprocedure Evaluation (Addendum)
Anesthesia Evaluation  Patient identified by MRN, date of birth, ID band Patient awake    Reviewed: Allergy & Precautions, NPO status , Patient's Chart, lab work & pertinent test results, reviewed documented beta blocker date and time   History of Anesthesia Complications Negative for: history of anesthetic complications  Airway Mallampati: II   Neck ROM: Full    Dental  (+) Edentulous Upper, Edentulous Lower   Pulmonary asthma , sleep apnea ,    breath sounds clear to auscultation       Cardiovascular hypertension, Pt. on medications and Pt. on home beta blockers + CAD, + Peripheral Vascular Disease and +CHF   Rhythm:Regular Rate:Normal     Neuro/Psych CVA negative psych ROS   GI/Hepatic Neg liver ROS, GERD  ,  Endo/Other  negative endocrine ROSdiabetes  Renal/GU ESRF and DialysisRenal disease  negative genitourinary   Musculoskeletal   Abdominal   Peds  Hematology  (+) Blood dyscrasia, anemia ,   Anesthesia Other Findings asthma, CVA, HTN, CAD, CHF, PVD, DMII, ESRD on HD  TTE 10/17/17: EF 35-40%, mild MS, mod MR LHC 11/01/17: Multivessel diffuse coronary disease with regions of significant obstruction within diffuse pattern of atherosclerosis as noted above.  There is no high-grade obstruction with the appearance of instability.  She has borderline significant distal left main disease. Initial recommendation is aggressive medical therapy.  There is no ideal targets for PCI.  The diffuse nature of disease would make coronary bypass surgery difficult but I believe it is still possible.  I do not believe she is yet at a point where CABG should be recommended.  Reproductive/Obstetrics                            Anesthesia Physical  Anesthesia Plan  ASA: III  Anesthesia Plan: General   Post-op Pain Management:    Induction: Intravenous  PONV Risk Score and Plan: 2 and Treatment may vary due  to age or medical condition, Dexamethasone and Ondansetron  Airway Management Planned: Simple Face Mask and Oral ETT  Additional Equipment: None  Intra-op Plan:   Post-operative Plan: Extubation in OR  Informed Consent: I have reviewed the patients History and Physical, chart, labs and discussed the procedure including the risks, benefits and alternatives for the proposed anesthesia with the patient or authorized representative who has indicated his/her understanding and acceptance.     Dental advisory given  Plan Discussed with: CRNA  Anesthesia Plan Comments:         Anesthesia Quick Evaluation

## 2019-01-05 NOTE — Progress Notes (Signed)
Family Medicine Teaching Service Daily Progress Note Intern Pager: [phone]  Patient name: Kristen Berry Medical record number: [id number] Date of birth: 07-02-1956 Age: 63 y.o. Gender: female  Primary Care Provider: Martinique, Sarah T, MD Consultants: Manson Passey, vascular surgery Code Status: full  Pt Overview and Major Events to Date:  4/7 admitted to fpts  Assessment and Plan: Kristen Berry is a 63 y.o. female presenting with worsening discoloration of right foot and cellulitis . 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 Off abx since 4/8. WBC up from 10.6 to 13 this am. NPO. Heparin gtt, stop 4 hours prior to sx. - vital signs per floor routine - NPO - Continue heparin gtt - hold brillinta and aspirin, restart 24 hours post op - Likely transtibial amp on 4/10 - tylenol 1g tid for pain pre-op  ESRD on HDMWF Dialysis on 4/8. Likely dialyze again post-op on 4/10. Appreciate nephro recs. phoslo with meals. Daily renal function panel.  CoViD-19 exposure Exposed to + hospital personnel during last admission. Per ID pt to be droplet and contact precaution x2 weeks. If asymptomatic in until 4/15, will lift these precautions.  Bed Bugs Patient with bed bugs discovered on her this am. Contact precautions, sanitize room when goes down for surgery  Diabetes mellitus type 2 5.5 on 12/22/2018. cbg 100-156 - cbg ac hs - SSI can be added if needed  MGUS Diagnosed in 2017. Patient not currently on any medication. [facility]  HFrEF January 2018 patient had echo that showed LVEF 35 to 40%. Septal and apical akinesis, moderately dilated LV, mild LVH. Patient not currently in respiratory distress. No signs of acute worsening.  CAD left  heart cath in February 2019 showed severe diffuse diabetic coronary disease, 70 to 80% stenosis of proximal LAD 75% obstruction of RCA  - hold brilliinta in the setting of likely surgical intervention  Mild asthma  since childhood. Hasn't usedher albuterolin months. No other medications. - prn albuterol inhaler  HTN PT takes amlodipine 5mg , Coreg 25mg  BID, hydralazine 25mg  BID at home. BP 132/52.  - continue home coreg, norvasc - add back hydral if BPs permit  HLD- pt takes 20mg  lipitor at home. - Continue Lipitor  History of left leg DVT patient takes Eliquis 2.5 mg BID for hx of DVT in left leg.  - hold eliquis - heparin gtt dosed per pharm in setting of likely surgical intervention  Hx of stroke -patient states she had a stroke May of last year. Was treated at [facility] in [place]. Does not appear to have any residual deficits. Patient takes Brilinta 90 mg twice daily.  -Hold Brilinta. Restart after surgery  FEN/GI: renal diet PPx: heparin gtt  Disposition: likely back to snf  Subjective:  Doing well this morning no complaints. Anxious for surgery. Watching golden girls on television during exam.  Objective: Temp:  [97.7 F (36.5 C)-98 F (36.7 C)] 98 F (36.7 C) (04/10 0641) Pulse Rate:  [72-89] 74 (04/10 0641) Resp:  [18] 18 (04/10 0641) BP: (123-135)/(54-66) 132/54 (04/10 0641) SpO2:  [100 %] 100 % (04/10 0641) Weight:  [81.5 kg] 81.5 kg (04/09 1038) Physical Exam: General: 63 year old caucasian female, resting comfortably in bed Cardiovascular: rrr, no m/r/g. Weak thrill in RUE AVF Respiratory: Lungs clear  to auscultation bilaterally, no increased work of breathing Abdomen: soft, non-tender, non-distended Extremities: Right foot unchanged from previous exam. Still black eschar overlying toe amputation site and up anterior foot.  Laboratory: Recent Labs  Lab 01/03/19 0535 01/04/19 0244 01/05/19 0321  WBC 11.1* 10.6* 13.0*  HGB  8.4* 10.4* 9.5*  HCT 25.7* 32.9* 29.9*  PLT 382 501* 567*   Recent Labs  Lab 01/03/19 1300 01/04/19 0244 01/05/19 0321  NA 134* 136 135  K 4.2 3.5 3.5  CL 99 98 99  CO2 21* 25 24  BUN 34* 11 23  CREATININE 3.82* 2.49* 3.45*  CALCIUM 9.1 9.1 9.3  GLUCOSE 110* 98 93   Imaging/Diagnostic Tests: CLINICAL DATA:  Redness and swelling of the medial aspect of the right foot. 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  EXAM: RIGHT FOOT COMPLETE - 3+ VIEW  COMPARISON:  Radiographs dated 12/21/2018  FINDINGS: There is edema and a small amount of gas in the soft tissues at the site of the previous resection of the first metatarsal. No evidence of osteomyelitis of the remaining bones. Slight arthritis between navicular and medial cuneiform, stable. Chronic calcaneal enthesophytes. Chronic dorsal spurring in the midfoot.  IMPRESSION: 1. No evidence of osteomyelitis. 2. Edema and small amount of gas in the soft tissues at the site of the previous resection of the first metatarsal and great toe. This could be residual postsurgical edema and air. Infection is not excluded.   Electronically Signed   By: Lorriane Shire M.D.   On: 01/02/2019 16:20  Kristen Dawn, MD 01/05/2019, 8:56 AM PGY-2, [place] Intern pager: [phone], text pages welcome

## 2019-01-05 NOTE — Transfer of Care (Signed)
Immediate Anesthesia Transfer of Care Note  Patient: Kristen Berry  Procedure(s) Performed: RIGHT BELOW KNEE AMPUTATION (Right Leg Lower) Application Of Wound Vac (Right Leg Lower)  Patient Location: PACU  Anesthesia Type:General  Level of Consciousness: awake, alert  and oriented  Airway & Oxygen Therapy: Patient Spontanous Breathing and Patient connected to face mask oxygen  Post-op Assessment: Report given to RN and Post -op Vital signs reviewed and stable  Post vital signs: Reviewed and stable  Last Vitals:  Vitals Value Taken Time  BP 118/43 01/05/2019  1:34 PM  Temp    Pulse 117 01/05/2019  1:36 PM  Resp 12 01/05/2019  1:36 PM  SpO2 98 % 01/05/2019  1:36 PM  Vitals shown include unvalidated device data.  Last Pain:  Vitals:   01/05/19 1320  TempSrc:   PainSc: (P) 10-Worst pain ever      Patients Stated Pain Goal: 3 (33/00/76 2263)  Complications: No apparent anesthesia complications

## 2019-01-05 NOTE — Interval H&P Note (Signed)
History and Physical Interval Note:  01/05/2019 6:56 AM  Kristen Berry  has presented today for surgery, with the diagnosis of Gangrene Right Foot.  The various methods of treatment have been discussed with the patient and family. After consideration of risks, benefits and other options for treatment, the patient has consented to  Procedure(s): RIGHT BELOW KNEE AMPUTATION (Right) as a surgical intervention.  The patient's history has been reviewed, patient examined, no change in status, stable for surgery.  I have reviewed the patient's chart and labs.  Questions were answered to the patient's satisfaction.     Erlinda Hong PA-C [facility] [phone]

## 2019-01-05 NOTE — Progress Notes (Signed)
[facility] Progress Note   Subjective: Scheduled for R Transtib amputation later this AM. Very pleasant, no C/Os.   Objective Vitals:   01/04/19 1038 01/04/19 1500 01/04/19 2140 01/05/19 0641  BP:  (!) 135/55 123/66 (!) 132/54  Pulse:  89 72 74  Resp:  18 18 18   Temp:  97.7 F (36.5 C) 97.7 F (36.5 C) 98 F (36.7 C)  TempSrc:  Oral Oral Oral  SpO2:  100% 100% 100%  Weight: 81.5 kg     Height:       Physical Exam General:Older female in NAD Heart:S1,S2 RRR Lungs:CTAB A/P Abdomen:active BS Extremities:R foot with blackened eschar along suture line. 1+ RLE. No edema LLE. Dialysis Access:RIJ TDC drsg CDI. L AVF faint bruit. Has suture in place at bottom of AVF from previous fistulagram. Remove today during HD.  Additional Objective Labs: Basic Metabolic Panel: Recent Labs  Lab 01/03/19 1300 01/04/19 0244 01/05/19 0321  NA 134* 136 135  K 4.2 3.5 3.5  CL 99 98 99  CO2 21* 25 24  GLUCOSE 110* 98 93  BUN 34* 11 23  CREATININE 3.82* 2.49* 3.45*  CALCIUM 9.1 9.1 9.3  PHOS 3.7 2.6 3.1   Liver Function Tests: Recent Labs  Lab 01/03/19 1300 01/04/19 0244 01/05/19 0321  ALBUMIN 2.4* 2.5* 2.6*   No results for input(s): LIPASE, AMYLASE in the last 168 hours. CBC: Recent Labs  Lab 12/31/18 0440 01/02/19 1641 01/03/19 0535 01/04/19 0244 01/05/19 0321  WBC 14.1* 16.4* 11.1* 10.6* 13.0*  NEUTROABS  --  12.8*  --   --   --   HGB 10.5* 9.9* 8.4* 10.4* 9.5*  HCT 33.1* 31.2* 25.7* 32.9* 29.9*  MCV 101.5* 101.0* 101.6* 100.6* 101.4*  PLT 450* 524* 382 501* 567*   Blood Culture    Component Value Date/Time   SDES BLOOD RIGHT FOREARM 01/02/2019 1641   SDES BLOOD RIGHT HAND 01/02/2019 1641   SPECREQUEST  01/02/2019 1641    BOTTLES DRAWN AEROBIC ONLY Blood Culture results may not be optimal due to an inadequate volume of blood received in culture bottles   SPECREQUEST  01/02/2019 1641    BOTTLES DRAWN AEROBIC ONLY Blood Culture adequate  volume   CULT  01/02/2019 1641    NO GROWTH 2 DAYS Performed at [facility], [place] [address] [place], [place] [address]    CULT  01/02/2019 1641    NO GROWTH 2 DAYS Performed at [facility], [place] [address], [place], [place] [address]    REPTSTATUS PENDING 01/02/2019 1641   REPTSTATUS PENDING 01/02/2019 1641    Cardiac Enzymes: No results for input(s): CKTOTAL, CKMB, CKMBINDEX, TROPONINI in the last 168 hours. CBG: Recent Labs  Lab 01/04/19 0808 01/04/19 1140 01/04/19 1632 01/04/19 2111 01/05/19 0819  GLUCAP 156* 137* 127* 141* 100*   Iron Studies: No results for input(s): IRON, TIBC, TRANSFERRIN, FERRITIN in the last 72 hours. @lablastinr3 @ Studies/Results: No results found. Medications: . sodium chloride    .  ceFAZolin (ANCEF) IV     . amLODipine  5 mg Oral QPM  . atorvastatin  20 mg Oral q1800  . calcitRIOL  0.5 mcg Oral Q M,W,F-HD  . carvedilol  25 mg Oral BID  . Chlorhexidine Gluconate Cloth  6 each Topical Q0600  . darbepoetin (ARANESP) injection - DIALYSIS  100 mcg Intravenous Q Wed-HD  . feeding supplement (PRO-STAT SUGAR FREE 64)  30 mL Oral BID  . gabapentin  100 mg Oral QHS  .  multivitamin  1 tablet Oral QHS  . pantoprazole  40 mg Oral QPM  . saccharomyces boulardii  250 mg Oral BID     HD orders: [place] MWF 81.5 kg 180NRe 400/800 3.0 K/2.25 Ca4 hrs RIJ TDC  -Heparin 2400 units IV TIW -Calcitriol 0.75 mcg PO TIW  Assessment/Plan: 1.R foot gangrene: No further limb salvage options. Plan for transtibial amp 01/05/19. Rec'd vanc and cefepime. WBC 16.4 on adm 13.0 today. Now off ABX. BC NGTD  X 2 days. Per primary.  2. Hypokalemia: K+ 2.8 40 MEQ KDUR PO today. Use 4.0 K bath with HD. Recheck K+ this evening. 3. ESRD -Now on MWF. HD 01/05/19 No heparin.Must be done as separate. K+ 3.5 Use 4.0 K bath. HD after surgery today.  4. Anemia -HGB 9.5. Rec'd Aranesp 100 mcg IV 01/03/2019. Follow HGB.  5. Secondary hyperparathyroidism -Ca 9.1  C Ca 10.2 Phos 2.6. Hold binders. Decrease VDRA to 0.5 mcg PO. Follow labs.  6.HTN/volume -BP controlled/ No evidence of volume excess. RLE edema related to wound.  7. Nutrition - albumin 2.5. NPO at present. Resumerenal diet post op. prostat Nepro. Renal vits.  8. Known COVID 19 exposure-Tested negative 12/27/2018. No symptoms. On droplet precautions. Has been on 14 day isolation which ends 01/09/19. 9.Active bed bug infestation. Notified primary. Will have HD as separate to prevent spread.  10. H/O chronic systolic HF EF 42-39%. Volume is stable.  11. H/O CAD-ASA,BB, Eliquis-no chest pain.   Anaja Monts H. Shelva Hetzer NP-C 01/05/2019, 9:20 AM  [employer] [phone]

## 2019-01-06 ENCOUNTER — Encounter (HOSPITAL_COMMUNITY): Payer: Self-pay | Admitting: Orthopedic Surgery

## 2019-01-06 LAB — CBC WITH DIFFERENTIAL/PLATELET
Abs Immature Granulocytes: 0.37 10*3/uL — ABNORMAL HIGH (ref 0.00–0.07)
Basophils Absolute: 0.1 10*3/uL (ref 0.0–0.1)
Basophils Relative: 0 %
Eosinophils Absolute: 0 10*3/uL (ref 0.0–0.5)
Eosinophils Relative: 0 %
HCT: 33.6 % — ABNORMAL LOW (ref 36.0–46.0)
Hemoglobin: 10.8 g/dL — ABNORMAL LOW (ref 12.0–15.0)
Immature Granulocytes: 2 %
Lymphocytes Relative: 8 %
Lymphs Abs: 1.5 10*3/uL (ref 0.7–4.0)
MCH: 32.6 pg (ref 26.0–34.0)
MCHC: 32.1 g/dL (ref 30.0–36.0)
MCV: 101.5 fL — ABNORMAL HIGH (ref 80.0–100.0)
Monocytes Absolute: 0.7 10*3/uL (ref 0.1–1.0)
Monocytes Relative: 3 %
Neutro Abs: 17.2 10*3/uL — ABNORMAL HIGH (ref 1.7–7.7)
Neutrophils Relative %: 87 %
Platelets: 573 10*3/uL — ABNORMAL HIGH (ref 150–400)
RBC: 3.31 MIL/uL — ABNORMAL LOW (ref 3.87–5.11)
RDW: 14.7 % (ref 11.5–15.5)
WBC: 19.9 10*3/uL — ABNORMAL HIGH (ref 4.0–10.5)
nRBC: 0.2 % (ref 0.0–0.2)

## 2019-01-06 LAB — RENAL FUNCTION PANEL
Albumin: 2.7 g/dL — ABNORMAL LOW (ref 3.5–5.0)
Anion gap: 14 (ref 5–15)
BUN: 18 mg/dL (ref 8–23)
CO2: 24 mmol/L (ref 22–32)
Calcium: 9.1 mg/dL (ref 8.9–10.3)
Chloride: 94 mmol/L — ABNORMAL LOW (ref 98–111)
Creatinine, Ser: 2.48 mg/dL — ABNORMAL HIGH (ref 0.44–1.00)
GFR calc Af Amer: 23 mL/min — ABNORMAL LOW (ref >60)
GFR calc non Af Amer: 20 mL/min — ABNORMAL LOW (ref >60)
Glucose, Bld: 263 mg/dL — ABNORMAL HIGH (ref 70–99)
Phosphorus: 3.1 mg/dL (ref 2.5–4.6)
Potassium: 4.2 mmol/L (ref 3.5–5.1)
Sodium: 132 mmol/L — ABNORMAL LOW (ref 135–145)

## 2019-01-06 LAB — GLUCOSE, CAPILLARY
Glucose-Capillary: 116 mg/dL — ABNORMAL HIGH (ref 70–99)
Glucose-Capillary: 245 mg/dL — ABNORMAL HIGH (ref 70–99)
Glucose-Capillary: 151 mg/dL — ABNORMAL HIGH (ref 70–99)
Glucose-Capillary: 176 mg/dL — ABNORMAL HIGH (ref 70–99)

## 2019-01-06 MED ORDER — PRO-STAT SUGAR FREE PO LIQD
30.0000 mL | Freq: Two times a day (BID) | ORAL | Status: DC
  Administered 2019-01-06 – 2019-01-10 (×9): 30 mL via ORAL
  Filled 2019-01-06 (×9): qty 30

## 2019-01-06 MED ORDER — CALCITRIOL 0.5 MCG PO CAPS
0.5000 ug | ORAL_CAPSULE | ORAL | Status: DC
  Administered 2019-01-10: 0.5 ug via ORAL
  Filled 2019-01-06: qty 1

## 2019-01-06 MED ORDER — RENA-VITE PO TABS
1.0000 | ORAL_TABLET | Freq: Every day | ORAL | Status: DC
  Administered 2019-01-06 – 2019-01-09 (×4): 1 via ORAL
  Filled 2019-01-06 (×4): qty 1

## 2019-01-06 NOTE — Evaluation (Signed)
Occupational Therapy Evaluation Patient Details Name: Kristen Berry MRN: [id number] DOB: 1956-09-03 Today's Date: 01/06/2019    History of Present Illness Pt is a 63 y/o female s/p Right BKA. PMH includes ESRD on HD, provoked bilateral DVT after surgery, type 2 diabetes, MGUS, and  HFpEF    Clinical Impression   Pt PTA: residing in SNF for skilled therapy. Pt performing most ADL with assist minimal assist from staff. Pt currently, R BKA. Pt a little impulsive for mobility in sit to stand and pivoting to [facility] with maxA+1. Recommend +2 for transfers as pt was unsteady with RW. Pt performing UB ADL with set-upA; :LB ADL with modA overall and education on new amputee sensation and mobility. Pt tolerating session well and reported minimal pain in RLE. Pt performing own bed  Mobility with minA for repositioning. Pt greatly requires continued OT skilled service for ADL, mobility and safety in SNF setting. OT to follow acutely.    Follow Up Recommendations  SNF;Supervision/Assistance - 24 hour    Equipment Recommendations  Other (comment)(to be determined at next venue)    Recommendations for Other Services PT consult     Precautions / Restrictions Precautions Precautions: Fall;Other (comment) Precaution Comments: contact; droplet - exposure to COVID-19, but tested negative; wound vac Restrictions Weight Bearing Restrictions: Yes RLE Weight Bearing: Non weight bearing      Mobility Bed Mobility Overal bed mobility: Needs Assistance Bed Mobility: Sit to Supine     Supine to sit: Modified independent (Device/Increase time);HOB elevated Sit to supine: Min assist   General bed mobility comments: Min assist to help support trunk during transitions and lightly lift legs  Transfers Overall transfer level: Needs assistance Equipment used: Rolling walker (2 wheeled) Transfers: Public house manager;Sit to/from Stand Sit to Stand: Mod assist;+2 physical assistance;+2 safety/equipment    Squat pivot transfers: Mod assist;+2 physical assistance;+2 safety/equipment    Lateral/Scoot Transfers: Mod assist;+2 physical assistance;+2 safety/equipment;With slide board General transfer comment: performed transfer as +1 to [facility] for stand pivot; unsafe without +2 as pt was unsteady and a bit impulsive     Balance Overall balance assessment: Needs assistance   Sitting balance-Leahy Scale: Good       Standing balance-Leahy Scale: Poor Standing balance comment: RW for support                           ADL either performed or assessed with clinical judgement   ADL Overall ADL's : Needs assistance/impaired Eating/Feeding: Modified independent;Sitting   Grooming: Set up;Sitting   Upper Body Bathing: Set up;Sitting   Lower Body Bathing: Moderate assistance;Sitting/lateral leans;Sit to/from stand   Upper Body Dressing : Set up;Sitting   Lower Body Dressing: Moderate assistance;Sitting/lateral leans;Sit to/from stand   Toilet Transfer: Moderate assistance;+2 for physical assistance;+2 for safety/equipment;Regular Toilet;RW;Stand-pivot   Toileting- Clothing Manipulation and Hygiene: Minimal assistance;+2 for physical assistance;+2 for safety/equipment;Sitting/lateral lean;Sit to/from stand       Functional mobility during ADLs: Moderate assistance;+2 for physical assistance;+2 for safety/equipment;Rolling walker(transfer only) General ADL Comments: set-upA for UB; modA for LB sit to stands     Vision Baseline Vision/History: Wears glasses Vision Assessment?: No apparent visual deficits     Perception     Praxis      Pertinent Vitals/Pain Pain Assessment: Faces Faces Pain Scale: Hurts little more Pain Location: R LE Pain Descriptors / Indicators: Aching;Throbbing     Hand Dominance Right   Extremity/Trunk Assessment Upper Extremity Assessment Upper Extremity Assessment:  Generalized weakness   Lower Extremity Assessment Lower Extremity Assessment:  Generalized weakness RLE Deficits / Details: R BKA   Cervical / Trunk Assessment Cervical / Trunk Assessment: Normal   Communication Communication Communication: No difficulties   Cognition Arousal/Alertness: Awake/alert Behavior During Therapy: WFL for tasks assessed/performed Overall Cognitive Status: Within Functional Limits for tasks assessed                                     General Comments  A bit impulsive for stand pivot/squat pivot from bed <-> BSC with RW. pt presenting well and will progress quickly.    Exercises General Exercises - Upper Extremity Chair Push Up: AROM;5 reps;Seated   Shoulder Instructions      Home Living Family/patient expects to be discharged to:: Skilled nursing facility                             Home Equipment: Gilford Rile - 4 wheels;Bedside commode;Tub bench          Prior Functioning/Environment Level of Independence: Needs assistance  Gait / Transfers Assistance Needed: Assist with gait for ambulation ADL's / Homemaking Assistance Needed: Set-upA for UB and modA for LB            OT Problem List: Decreased strength;Decreased activity tolerance;Impaired balance (sitting and/or standing);Decreased cognition;Decreased safety awareness;Pain      OT Treatment/Interventions: Self-care/ADL training;Therapeutic exercise;Neuromuscular education;Energy conservation;Therapeutic activities;DME and/or AE instruction;Patient/family education;Balance training    OT Goals(Current goals can be found in the care plan section) Acute Rehab OT Goals Patient Stated Goal: to go back to SNF OT Goal Formulation: With patient Time For Goal Achievement: 01/21/19 Potential to Achieve Goals: Good ADL Goals Pt Will Perform Lower Body Dressing: with modified independence;sitting/lateral leans;sit to/from stand Pt Will Transfer to Toilet: with min assist;squat pivot transfer Pt Will Perform Toileting - Clothing Manipulation and  hygiene: with min guard assist;sit to/from stand Pt/caregiver will Perform Home Exercise Program: Both right and left upper extremity;With Supervision;With written HEP provided;Increased strength Additional ADL Goal #1: Pt will transfers with minA for appropriate transfer from bed <-> recliner or bed <-bsc>  OT Frequency: Min 3X/week   Barriers to D/C:            Co-evaluation              AM-PAC OT "6 Clicks" Daily Activity     Outcome Measure Help from another person eating meals?: None Help from another person taking care of personal grooming?: None Help from another person toileting, which includes using toliet, bedpan, or urinal?: A Lot Help from another person bathing (including washing, rinsing, drying)?: A Lot Help from another person to put on and taking off regular upper body clothing?: A Little Help from another person to put on and taking off regular lower body clothing?: A Lot 6 Click Score: 17   End of Session Equipment Utilized During Treatment: Rolling walker Nurse Communication: Mobility status  Activity Tolerance: Patient tolerated treatment well Patient left: in bed;with call bell/phone within reach;with bed alarm set  OT Visit Diagnosis: Unsteadiness on feet (R26.81);Muscle weakness (generalized) (M62.81);Other abnormalities of gait and mobility (R26.89);Pain Pain - Right/Left: Right Pain - part of body: Leg                Time: 0932-6712 OT Time Calculation (min): 28 min Charges:  OT General Charges $OT  Visit: 1 Visit OT Evaluation $OT Eval Moderate Complexity: 1 Mod OT Treatments $Self Care/Home Management : 8-22 mins  Darryl Nestle) Marsa Aris OTR/L Acute Rehabilitation Services Pager: [phone] Office: [place] 01/06/2019, 4:43 PM

## 2019-01-06 NOTE — Plan of Care (Signed)
  Problem: Education: Goal: Knowledge of General Education information will improve Description Including pain rating scale, medication(s)/side effects and non-pharmacologic comfort measures Outcome: Progressing   Problem: Health Behavior/Discharge Planning: Goal: Ability to manage health-related needs will improve Outcome: Progressing   Problem: Clinical Measurements: Goal: Ability to maintain clinical measurements within normal limits will improve Outcome: Progressing Goal: Will remain free from infection Outcome: Progressing Goal: Diagnostic test results will improve Outcome: Progressing Goal: Cardiovascular complication will be avoided Outcome: Progressing   Problem: Activity: Goal: Risk for activity intolerance will decrease Outcome: Progressing   Problem: Nutrition: Goal: Adequate nutrition will be maintained Outcome: Progressing   Problem: Coping: Goal: Level of anxiety will decrease Outcome: Progressing   Problem: Elimination: Goal: Will not experience complications related to bowel motility Outcome: Progressing Goal: Will not experience complications related to urinary retention Outcome: Progressing   Problem: Pain Managment: Goal: General experience of comfort will improve Outcome: Progressing   Problem: Safety: Goal: Ability to remain free from injury will improve Outcome: Progressing   Problem: Education: Goal: Knowledge of the prescribed therapeutic regimen will improve Outcome: Progressing Goal: Ability to verbalize activity precautions or restrictions will improve Outcome: Progressing   Problem: Activity: Goal: Ability to perform//tolerate increased activity and mobilize with assistive devices will improve Outcome: Progressing   Problem: Clinical Measurements: Goal: Postoperative complications will be avoided or minimized Outcome: Progressing   Problem: Pain Management: Goal: Pain level will decrease with appropriate interventions Outcome:  Progressing   Problem: Skin Integrity: Goal: Demonstration of wound healing without infection will improve Outcome: Progressing

## 2019-01-06 NOTE — Discharge Instructions (Signed)

## 2019-01-06 NOTE — Progress Notes (Signed)
Orthopedic Tech Progress Note Patient Details:  Afra Tricarico [facility] 1955/11/29 [id number] Called in order to  [facility] and ordered it STAT Patient ID: AAMILAH AUGENSTEIN, female   DOB: 11-13-1955, 63 y.o.   MRN: [id number]   Janit Pagan 01/06/2019, 8:14 AM

## 2019-01-06 NOTE — Progress Notes (Signed)
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 Sharol Given.Rec'dvanc and cefepime.  Now off ABX. BC NGTD  X 2 days. Per primary.  2. Hypokalemia: corrected 3. ESRD -Now on MWF.No heparin.Must be done as separate.K+ 3.5 Used 4.0 K bath. HD after surgery today.  4. Anemia -HGB9.5 > 11.6 Rec'dAranesp 100 mcg IV 01/03/2019. Follow HGB. 5. Secondary hyperparathyroidism - Phos 2.6. Held binders. Decreased VDRA to 0.5 mcg PO due to ^ corr Ca. Follow labs. 6.HTN/volume -net UF 2 L 4/10 - lower edw due to BKA 7. Nutrition -albumin 2.5.renal diet added prostatenal vits.  8. Known COVID 19 exposure during last hospitalization-Tested negative 12/27/2018. No symptoms. On droplet precautions. Has been on 14 day isolation which ends 01/09/19. 9.Active bed bug infestation. Had brief exposure after last d/c from SNF. To be d/c to [place] -10. H/O chronic systolic HF EF 26-37%. Volume is stable.  11. H/O CAD-ASA,BB, Eliquis-no chest pain.  12.  -had fistulogram on 4/1 Dr Trula Slade planning fistula elevation and branch ligation procedure in the future - will need to have DAPT stopped 48 hours in advance of future ortho or vascular procedures  13. DNR   Myriam Jacobson, PA-C [facility] Beeper [phone] 01/06/2019,9:50 AM  LOS: 4 days   Subjective:   Some pain but managing - good outlook  Objective Vitals:   01/05/19 2345 01/05/19 2358 01/06/19 0220 01/06/19 0513  BP: (!) 130/53 (!) 131/52 (!) 136/45 (!) 151/56  Pulse: 74 75 84   Resp: 18  16 16   Temp: 97.6 F (36.4 C)  97.7 F (36.5 C) 98.1 F (36.7 C)  TempSrc: Oral  Oral Oral  SpO2: 100%  100% 100%  Weight: 78.4 kg     Height:       Physical Exam General: NAD breathing easily on room air Heart: RRR Lungs: no  rales Abdomen: obese soft NTNT Extremities: right BKA with VAC left tr LE edema Dialysis Access: irght IJ TDC and poorly matured AVF   Additional Objective Labs: Basic Metabolic Panel: Recent Labs  Lab 01/03/19 1300 01/04/19 0244 01/05/19 0321 01/05/19 1141  NA 134* 136 135 135  K 4.2 3.5 3.5 3.7  CL 99 98 99  --   CO2 21* 25 24  --   GLUCOSE 110* 98 93 98  BUN 34* 11 23  --   CREATININE 3.82* 2.49* 3.45*  --   CALCIUM 9.1 9.1 9.3  --   PHOS 3.7 2.6 3.1  --    Liver Function Tests: Recent Labs  Lab 01/03/19 1300 01/04/19 0244 01/05/19 0321  ALBUMIN 2.4* 2.5* 2.6*   No results for input(s): LIPASE, AMYLASE in the last 168 hours. CBC: Recent Labs  Lab 12/31/18 0440 01/02/19 1641 01/03/19 0535 01/04/19 0244 01/05/19 0321 01/05/19 1141  WBC 14.1* 16.4* 11.1* 10.6* 13.0*  --   NEUTROABS  --  12.8*  --   --   --   --   HGB 10.5* 9.9* 8.4* 10.4* 9.5* 11.6*  HCT 33.1* 31.2* 25.7* 32.9* 29.9* 34.0*  MCV 101.5* 101.0* 101.6* 100.6* 101.4*  --   PLT 450* 524* 382 501* 567*  --    Blood Culture    Component Value Date/Time   SDES BLOOD RIGHT FOREARM 01/02/2019 1641   SDES BLOOD RIGHT HAND 01/02/2019  [place]  01/02/2019 [place] Blood Culture results may not be optimal due to an inadequate volume of blood received in culture bottles   SPECREQUEST  01/02/2019 1641    BOTTLES DRAWN AEROBIC ONLY Blood Culture adequate volume   CULT  01/02/2019 1641    NO GROWTH 4 DAYS Performed at [facility], [place] [address] [place], [place] [address]    CULT  01/02/2019 1641    NO GROWTH 4 DAYS Performed at [facility], [place] [address], [place], [place] [address]    REPTSTATUS PENDING 01/02/2019 1641   REPTSTATUS PENDING 01/02/2019 1641    Cardiac Enzymes: No results for input(s): CKTOTAL, CKMB, CKMBINDEX, TROPONINI in the last 168 hours. CBG: Recent Labs  Lab 01/05/19 0819 01/05/19 1322 01/05/19 1642 01/05/19 2150  01/06/19 0753  GLUCAP 100* 113* 136* 155* 116*   Iron Studies: No results for input(s): IRON, TIBC, TRANSFERRIN, FERRITIN in the last 72 hours. Lab Results  Component Value Date   INR 1.3 (H) 01/05/2019   INR 1.2 12/27/2018   INR 1.7 (H) 12/22/2018   Studies/Results: No results found. Medications:  . acetaminophen  1,000 mg Oral Q6H  . apixaban  2.5 mg Oral BID  . docusate sodium  100 mg Oral BID  . insulin aspart  0-9 Units Subcutaneous TID WC  . ticagrelor  90 mg Oral BID

## 2019-01-06 NOTE — Progress Notes (Signed)
Family Medicine Teaching Service Daily Progress Note Intern Pager: [phone]  Patient name: Kristen Berry Medical record number: [id number] Date of birth: August 24, 1956 Age: 63 y.o. Gender: female  Primary Care Provider: Martinique, Sarah T, MD Consultants: Manson Passey, vascular surgery Code Status: full  Pt Overview and Major Events to Date:  4/7 admitted to fpts  Assessment and Plan: Kristen Berry is a 63 y.o. female presenting with worsening discoloration of right foot and cellulitis . PMH is significant forosteomyelitis of the left toes s/p amputation, ESRD, diabetes type 2, hypertension, history of DVT, history of stroke, CHF, MGUS  Worsening necrosis of right second toe and foot, right foot cellulitis s/p right BKA Patient underwent right BKA yesterday and tolerated procedure well. Wound vac in place, will continue on discharge. Follow up with Dr. Sharol Given a week from operation. For now NWB per Ortho. Pain is well controlled. No weight  WBC this am  --F/u on Ortho recs  - Resume Brilinta, Eliquis, and aspirin today  --Continue Dilaudid 0.5-1 mg q4 prn for breakthrough pain --Continue oxycodone 10-15 mg q4 prn for severe pain --Continue Oxycodone 5-10 mg q4 prn for moderate pain  ESRD on HDMWF Dialysis on 4/8. Likely HD session today. --F/u on Nephro recs.  --F/u on daily renal function panel.  CoViD-19 exposure Exposed to + hospital personnel during last admission. Per ID pt to be droplet and contact precaution x2 weeks. If continue to be asymptomatic will lift these precautions on 4/15 (last day of quarantine).  Bed Bugs Patient with bed bugs discovered on her this am. Contact precautions, sanitize room when goes down for surgery  Diabetes mellitus type 2 5.5 on 12/22/2018. cbg 100-156. This am 116 - cbg ac hs - SSI can be added if needed  MGUS Diagnosed in 2017. Patient not currently on any medication. [facility]  HFrEF January 2018  patient had echo that showed LVEF 35 to 40%. Septal and apical akinesis, moderately dilated LV, mild LVH. Patient not currently in respiratory distress. No signs of acute worsening.  CAD left heart cath in February 2019 showed severe diffuse diabetic coronary disease, 70 to 80% stenosis of proximal LAD 75% obstruction of RCA  - hold brilliinta in the setting of likely surgical intervention  Mild asthma, chronic Hasn't usedher albuterolin months. No other medications. - prn albuterol inhaler  HTN Patient takes amlodipine 5 mg, Coreg 25mg  BID, hydralazine 25 mg BID at home. BP this am 151/56. Currently BP meds on hold.  --Will resume amlodipine 5 mg daily --Add back carvedilol 25 mg bid if continue be elevated  HLD- pt takes 20mg  lipitor at home. --Resume Lipitor  History of left leg DVT patient takes Eliquis 2.5 mg BID for hx of DVT in left leg.  - Resume eliquis 2.5 mg bid today 4/11 - heparin gtt d/ced  Hx of stroke -patient states she had a stroke May of last year. Was treated at [facility] in [place]. Does not appear to have any residual deficits. Patient takes Brilinta 90 mg twice daily.  -Hold Brilinta. Restart after surgery  FEN/GI: renal diet PPx: on Eliquis, Aspirin and brilinta  Disposition: SNF vs inpatient rehab  Subjective:  Patient doing well this morning. Pain is well controlled. She has good appetite and denies any nausea, vomiting, shortness of breath, abdominal pain.   Objective: Temp:  [97.6 F (36.4 C)-98.7 F (37.1 C)] 98.1 F (36.7 C) (04/11 0513) Pulse Rate:  [52-84] 84 (04/11 0220)  Resp:  [12-18] 16 (04/11 0513) BP: (108-151)/(38-66) 151/56 (04/11 0513) SpO2:  [95 %-100 %] 100 % (04/11 0513) Weight:  [78.4 kg-81.5 kg] 78.4 kg (04/10 2345)   Physical Exam: General: 63 year old caucasian female, resting comfortably in bed Cardiovascular: rrr, no m/r/g. Weak thrill in RUE AVF Respiratory: Lungs clear to auscultation  bilaterally, no increased work of breathing Abdomen: soft, non-tender, non-distended Extremities: Right foot unchanged from previous exam. Still black eschar overlying toe amputation site and up anterior foot.  Laboratory: Recent Labs  Lab 01/03/19 0535 01/04/19 0244 01/05/19 0321 01/05/19 1141  WBC 11.1* 10.6* 13.0*  --   HGB 8.4* 10.4* 9.5* 11.6*  HCT 25.7* 32.9* 29.9* 34.0*  PLT 382 501* 567*  --    Recent Labs  Lab 01/03/19 1300 01/04/19 0244 01/05/19 0321 01/05/19 1141  NA 134* 136 135 135  K 4.2 3.5 3.5 3.7  CL 99 98 99  --   CO2 21* 25 24  --   BUN 34* 11 23  --   CREATININE 3.82* 2.49* 3.45*  --   CALCIUM 9.1 9.1 9.3  --   GLUCOSE 110* 98 93 98   Imaging/Diagnostic Tests: CLINICAL DATA:  Redness and swelling of the medial aspect of the right foot. 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  EXAM: RIGHT FOOT COMPLETE - 3+ VIEW  COMPARISON:  Radiographs dated 03/26/202 IMPRESSION: 1. No evidence of osteomyelitis. 2. Edema and small amount of gas in the soft tissues at the site of the previous resection of the first metatarsal and great toe. This could be residual postsurgical edema and air. Infection is not excluded.    Marjie Skiff, MD  01/06/2019, 9:36 AM PGY-3, [place] Intern pager: [phone], text pages welcome

## 2019-01-06 NOTE — Evaluation (Addendum)
Physical Therapy Evaluation Patient Details Name: DRUE CAMERA MRN: [id number] DOB: 04/24/1956 Today's Date: 01/06/2019   History of Present Illness  Pt is a 63 y/o female s/p Right BKA. PMH includes ESRD on HD, provoked bilateral DVT after surgery, type 2 diabetes, MGUS, and  HFpEF   Clinical Impression  Pt was able to sit EOB with me today and work in in bed exercises.  She was able to get up and transfer OOB to the [facility] with OT, but was fearful and did not want to attempt standing again.  I am familiar with her last admission and she does very well with lateral scoot transfers.  She did not want to try that today either, but is agreeable for lateral scoot transfer OOB to chair next session.  She remains appropriate for post acute rehab at discharge.   PT to follow acutely for deficits listed below.  [employer] HEP made and printed.  Next therapist to take into room (in paper chart now).  [place]      Follow Up Recommendations SNF    Equipment Recommendations  Rolling walker with 5" wheels;Wheelchair (measurements PT);Wheelchair cushion (measurements PT);3in1 (PT);Other (comment)(drop arm 3-in-1)    Recommendations for Other Services   NA     Precautions / Restrictions Precautions Precautions: Fall;Other (comment) Precaution Comments: contact; droplet - exposure to COVID-19, but tested negative; wound vac Required Braces or Orthoses: Other Brace Other Brace: limb guard right leg Restrictions RLE Weight Bearing: Non weight bearing      Mobility  Bed Mobility Overal bed mobility: Needs Assistance Bed Mobility: Supine to Sit     Supine to sit: Supervision Sit to supine: Supervision   General bed mobility comments: Supervision for safety  Transfers                 General transfer comment: Pt did not want to attempt to stand again and was not ready to even laterally scoot OOB to chair this PM.  We did sit EOB for a while and preform in bed BKA exercises.            Balance Overall balance assessment: Needs assistance Sitting-balance support: Feet supported;No upper extremity supported Sitting balance-Leahy Scale: Good                                       Pertinent Vitals/Pain Pain Assessment: Faces Faces Pain Scale: Hurts even more Pain Location: R LE Pain Descriptors / Indicators: Aching;Throbbing    Home Living Family/patient expects to be discharged to:: [place]: [place] Rile - 4 wheels;Bedside commode;Tub bench Additional Comments: Also has a lift chair that she would rather not have to use the lift function; she sleeps in her lift chair    Prior Function Level of Independence: Needs assistance   Gait / Transfers Assistance Needed: assist for transfers at SNF  ADL's / Homemaking Assistance Needed: Set-upA for UB and modA for LB        Hand Dominance   Dominant Hand: Right    Extremity/Trunk Assessment   Upper Extremity Assessment Upper Extremity Assessment: Defer to OT evaluation    Lower Extremity Assessment Lower Extremity Assessment: RLE deficits/detail RLE Deficits / Details: right BKA immobilized in limb guard with good fit.  Pt able to lift leg against gravity with at least 3/5  for hip abduction and flexion.      Cervical / Trunk Assessment Cervical / Trunk Assessment: Normal  Communication   Communication: No difficulties  Cognition Arousal/Alertness: Awake/alert Behavior During Therapy: WFL for tasks assessed/performed Overall Cognitive Status: Within Functional Limits for tasks assessed                                        General Comments General comments (skin integrity, edema, etc.): Educated pt re: phantom limb pain and self massage.      Exercises Amputee Exercises Quad Sets: AROM;Right;10 reps Hip ABduction/ADduction: AROM;Right;10 reps;Supine;Sidelying Other Exercises Other Exercises: hip extension in left  sidelying x 10   Assessment/Plan    PT Assessment Patient needs continued PT services  PT Problem List Decreased strength;Decreased range of motion;Decreased activity tolerance;Decreased balance;Decreased mobility;Decreased knowledge of use of DME;Decreased knowledge of precautions;Pain       PT Treatment Interventions DME instruction;Gait training;Stair training;Functional mobility training;Therapeutic exercise;Therapeutic activities;Balance training;Patient/family education;Wheelchair mobility training    PT Goals (Current goals can be found in the Care Plan section)  Acute Rehab PT Goals Patient Stated Goal: to get over this and get a prosthesis and walk again PT Goal Formulation: With patient Time For Goal Achievement: 01/20/19 Potential to Achieve Goals: Good    Frequency Min 2X/week           AM-PAC PT "6 Clicks" Mobility  Outcome Measure Help needed turning from your back to your side while in a flat bed without using bedrails?: None Help needed moving from lying on your back to sitting on the side of a flat bed without using bedrails?: None Help needed moving to and from a bed to a chair (including a wheelchair)?: A Lot Help needed standing up from a chair using your arms (e.g., wheelchair or bedside chair)?: A Lot Help needed to walk in hospital room?: Total Help needed climbing 3-5 steps with a railing? : Total 6 Click Score: 14    End of Session   Activity Tolerance: Patient limited by pain;Other (comment)(limited by fear of falling) Patient left: in bed;with call bell/phone within reach;with bed alarm set   PT Visit Diagnosis: Difficulty in walking, not elsewhere classified (R26.2);Pain;Muscle weakness (generalized) (M62.81) Pain - Right/Left: Right Pain - part of body: Leg    Time: 3154-0086 PT Time Calculation (min) (ACUTE ONLY): 45 min   Charges:      Wells Guiles B. Naz Denunzio, PT, DPT  Acute Rehabilitation [phone] pager #[phone]  office   PT Evaluation $PT Eval Moderate Complexity: 1 Mod PT Treatments $Therapeutic Exercise: 8-22 mins $Therapeutic Activity: 8-22 mins       01/06/2019, 5:25 PM

## 2019-01-06 NOTE — Discharge Summary (Addendum)
[facility] Discharge Summary  Patient name: Kristen Berry Medical record number: [id number] Date of birth: [birth year] Age: 63 y.o. Gender: female Date of Admission: 01/02/2019  Date of Discharge: 01/10/19 Admitting Physician: Alveda Reasons, MD  Primary Care Provider: Martinique, Sarah T, MD Consultants: [facility], orthopedics  Indication for Hospitalization:  Necrotic wound to RLE  Discharge Diagnoses/Problem List: Necrotic wound to right second toe right foot s/p amputation ESRD CAD without any exposure Bedbugs HFrEF Coronary artery disease Asthma  Disposition: back to SNF  Discharge Condition: stable, improved  Discharge Exam:  Gen: laying in bed in HD unit, in NAD Heart: RRR Lungs: CTAB, NWOB on RA Abdomen: soft, nontender, + bowel sounds Extremities: R LE with wound vacn and stump protector Neuro: alert and awake, grossly normal   Brief Hospital Course:  63 year old female who presented on 4/7 as a readmission due to worsening of necrotic right foot wound.  Of note patient had been recently admitted to the hospital for a right great toe ray amputation secondary to nonhealing chronic wound. She was treated with vanc/cefepime/flagyl and orthopedics was consulted.   Vascular surgery also evaluated patient and  felt there was no further options regarding her peripheral artery disease.  Patient was taken to surgery on 01/05/2019 for a right lower extremity transtibial amputation by orthopedics. She did well post operatively and was stable for discharge with wound vac and follow up with orthopedics in 1 week  Bedbugs Patient found to have bedbugs on readmission.  Patient in room were thoroughly decontaminated during admission.  Exposed to COVID-19 positive individual Patient cared for by a healthcare personnel who had tested positive for COVID. Patient tested negative for COVID on 12/27/18. Discussed with infection control and they recommended contact and  droplet precaution.  Patient's quarantine completed on 01/10/2019 as she remained asymptomatic during her hospital stay  Issues for Follow Up:  1. Follow up with orthopedics 1 week after discharge for wound vac 2. Monitor BPs after restarting home meds on discharge  Significant Procedures: RLE Transtibial amputation 01/05/2019  Significant Labs and Imaging:  Recent Labs  Lab 01/08/19 0338 01/09/19 0219 01/10/19 0141  WBC 15.5* 13.4* 11.8*  HGB 10.1* 10.1* 10.8*  HCT 32.0* 32.5* 34.4*  PLT 493* 514* 480*   Recent Labs  Lab 01/03/19 1300 01/04/19 0244 01/05/19 0321 01/05/19 1141 01/06/19 0924 01/08/19 0338 01/09/19 0219 01/10/19 0141  NA 134* 136 135 135 132* 133* 131* 135  K 4.2 3.5 3.5 3.7 4.2 3.7 4.1 4.0  CL 99 98 99  --  94* 93* 96* 100  CO2 21* 25 24  --  24 23 22 23   GLUCOSE 110* 98 93 98 263* 124* 106* 106*  BUN 34* 11 23  --  18 59* 74* 36*  CREATININE 3.82* 2.49* 3.45*  --  2.48* 3.94* 4.31* 2.88*  CALCIUM 9.1 9.1 9.3  --  9.1 9.2 8.9 9.0  PHOS 3.7 2.6 3.1  --  3.1  --   --   --   ALBUMIN 2.4* 2.5* 2.6*  --  2.7*  --   --   --     Results/Tests Pending at Time of Discharge: none  Discharge Medications:  Allergies as of 01/10/2019      Reactions   Crestor [rosuvastatin Calcium] Other (See Comments)   Leg pain      Medication List    STOP taking these medications   heparin 1000 unit/mL Soln injection   oxyCODONE 5 MG  immediate release tablet Commonly known as:  Oxy IR/ROXICODONE   Vancomycin 750-5 MG/150ML-% Soln Commonly known as:  VANCOCIN     TAKE these medications   acetaminophen 500 MG tablet Commonly known as:  TYLENOL Take 500 mg by mouth every 8 (eight) hours as needed for mild pain or headache.   alteplase 2 MG injection Commonly known as:  CATHFLO ACTIVASE 2 mg by Intracatheter route once as needed for open catheter.   amLODipine 5 MG tablet Commonly known as:  NORVASC Take 5 mg by mouth every evening.   apixaban 2.5 MG Tabs  tablet Commonly known as:  ELIQUIS Take 1 tablet (2.5 mg total) by mouth 2 (two) times daily.   aspirin 81 MG EC tablet Take 1 tablet (81 mg total) by mouth daily.   atorvastatin 20 MG tablet Commonly known as:  LIPITOR Take 1 tablet (20 mg total) by mouth daily at 6 PM.   calcitRIOL 0.25 MCG capsule Commonly known as:  ROCALTROL Take 3 capsules (0.75 mcg total) by mouth Every Tuesday,Thursday,and Saturday with dialysis.   calcium acetate 667 MG capsule Commonly known as:  PHOSLO Take 667 mg by mouth 2 (two) times daily with a meal.   carvedilol 25 MG tablet Commonly known as:  COREG Take 25 mg by mouth 2 (two) times daily.   clotrimazole-betamethasone cream Commonly known as:  LOTRISONE Apply 1 application topically daily as needed (rash).   diphenoxylate-atropine 2.5-0.025 MG tablet Commonly known as:  LOMOTIL Take 1 tablet by mouth every 12 (twelve) hours as needed for diarrhea or loose stools.   gabapentin 100 MG capsule Commonly known as:  NEURONTIN Take 1 capsule (100 mg total) by mouth at bedtime.   hydrALAZINE 25 MG tablet Commonly known as:  APRESOLINE Take 25 mg by mouth 2 (two) times daily.   lidocaine-prilocaine cream Commonly known as:  EMLA Apply 1 application topically daily as needed (dialysis).   multivitamin Tabs tablet Take 1 tablet by mouth at bedtime.   pantoprazole 40 MG tablet Commonly known as:  PROTONIX Take 40 mg by mouth daily.   pentafluoroprop-tetrafluoroeth Aero Commonly known as:  GEBAUERS Apply 1 application topically as needed (topical anesthesia for hemodialysis).   pentoxifylline 400 MG CR tablet Commonly known as:  TRENTAL Take 1 tablet (400 mg total) by mouth daily with supper.   polyethylene glycol 17 g packet Commonly known as:  MIRALAX / GLYCOLAX Take 17 g by mouth daily as needed for mild constipation.   ProAir HFA 108 (90 Base) MCG/ACT inhaler Generic drug:  albuterol Inhale 2 puffs into the lungs every 6 (six)  hours as needed for wheezing or shortness of breath.   saccharomyces boulardii 250 MG capsule Commonly known as:  FLORASTOR Take 1 capsule (250 mg total) by mouth 2 (two) times daily.   ticagrelor 90 MG Tabs tablet Commonly known as:  BRILINTA Take 90 mg by mouth 2 (two) times daily.       Discharge Instructions: Please refer to Patient Instructions section of EMR for full details.  Patient was counseled important signs and symptoms that should prompt return to medical care, changes in medications, dietary instructions, activity restrictions, and follow up appointments.   Follow-Up Appointments:  Contact information for follow-up providers    Newt Minion, MD Follow up in 1 week(s).   Specialty:  Orthopedic Surgery Contact information: [place] [address] [phone]        Martinique, Sarah T, MD Follow up.  Specialty:  Family Medicine Contact information: [place]. [place] [address] [phone]            Contact information for after-discharge care    Destination    [facility] Preferred SNF .   Service:  Skilled Nursing Contact information: [address] [place] [address] [place], [place], DO 01/10/2019, 12:24 PM PGY-3, [place]

## 2019-01-07 DIAGNOSIS — Z66 Do not resuscitate: Secondary | ICD-10-CM

## 2019-01-07 LAB — CULTURE, BLOOD (ROUTINE X 2)
Culture: NO GROWTH
Culture: NO GROWTH
Special Requests: ADEQUATE

## 2019-01-07 LAB — GLUCOSE, CAPILLARY
Glucose-Capillary: 94 mg/dL (ref 70–99)
Glucose-Capillary: 125 mg/dL — ABNORMAL HIGH (ref 70–99)
Glucose-Capillary: 158 mg/dL — ABNORMAL HIGH (ref 70–99)
Glucose-Capillary: 137 mg/dL — ABNORMAL HIGH (ref 70–99)
Glucose-Capillary: 127 mg/dL — ABNORMAL HIGH (ref 70–99)

## 2019-01-07 MED ORDER — CHLORHEXIDINE GLUCONATE CLOTH 2 % EX PADS
6.0000 | MEDICATED_PAD | Freq: Every day | CUTANEOUS | Status: DC
  Administered 2019-01-07 – 2019-01-10 (×4): 6 via TOPICAL

## 2019-01-07 MED ORDER — ACETAMINOPHEN 325 MG PO TABS
325.0000 mg | ORAL_TABLET | Freq: Four times a day (QID) | ORAL | Status: DC
  Administered 2019-01-07 – 2019-01-08 (×6): 650 mg via ORAL
  Filled 2019-01-07 (×7): qty 2

## 2019-01-07 MED ORDER — OXYCODONE HCL 5 MG PO TABS
5.0000 mg | ORAL_TABLET | ORAL | Status: DC | PRN
  Administered 2019-01-07: 5 mg via ORAL
  Administered 2019-01-07: 10 mg via ORAL
  Administered 2019-01-07 (×2): 5 mg via ORAL
  Administered 2019-01-08 (×2): 10 mg via ORAL
  Filled 2019-01-07 (×5): qty 2

## 2019-01-07 NOTE — Progress Notes (Signed)
VAC seal good. Pain controlled.

## 2019-01-07 NOTE — Progress Notes (Signed)
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 Sharol Given.Rec'dvanc and cefepime.  Now off ABX. BC NGTD  X 2 days. Per primary. - significant bump in WBC 4/11- afebrile - recheck Monday 2. Hypokalemia: corrected 3. ESRD -MWF. No heparin.Must be done as separate.K+ 4.2  . HD tommorow 4. Anemia -HGB9.5 > 11.6 > 10.8  Rec'dAranesp 100 mcg IV 01/03/2019. Follow HGB. 5. Secondary hyperparathyroidism - Phos 2.6. Held binders. Decreased VDRA to 0.5 mcg PO due to ^ corr Ca. Follow labs. 6.HTN/volume -net UF 2 L 4/10 > 78.4  - lower edw due to BKA 7. Nutrition -albumin 2.5.renal diet added prostatenal vits.  8. Known COVID 19 exposure during last hospitalization-Tested negative 12/27/2018. No symptoms. On droplet precautions. Has been on 14 day isolation which ends 01/09/19. 9.Active bed bug infestation. Had brief exposure after last d/c from SNF. To be d/c to [facility] -10. H/O chronic systolic HF EF 60-10%. Volume is stable.  11. H/O CAD-ASA,BB, Eliquis-no chest pain.  12.  -had fistulogram on 4/1 Dr Trula Slade planning fistula elevation and branch ligation procedure in the future - will need to have DAPT stopped 48 hours in advance of future ortho or vascular procedures  13. DNR   Myriam Jacobson, PA-C [facility] Beeper [phone] 01/07/2019,8:14 AM  LOS: 5 days   Subjective:  Good spirits - eating well.  Objective Vitals:   01/06/19 0220 01/06/19 0513 01/06/19 2119 01/07/19 0612  BP: (!) 136/45 (!) 151/56 134/60 136/64  Pulse: 84  87 85  Resp: 16 16 18 18   Temp: 97.7 F (36.5 C) 98.1 F (36.7 C) 98 F (36.7 C) 98.1 F (36.7 C)  TempSrc: Oral Oral Oral Oral  SpO2: 100% 100% 99% 100%  Weight:      Height:       Physical Exam General: NAD breathing easily on  room air Heart: RRR Lungs: no rales Abdomen: obese soft NTNT Extremities: right BKA with VAC left tr LE edema Dialysis Access: irght IJ TDC and poorly matured AVF   Additional Objective Labs: Basic Metabolic Panel: Recent Labs  Lab 01/04/19 0244 01/05/19 0321 01/05/19 1141 01/06/19 0924  NA 136 135 135 132*  K 3.5 3.5 3.7 4.2  CL 98 99  --  94*  CO2 25 24  --  24  GLUCOSE 98 93 98 263*  BUN 11 23  --  18  CREATININE 2.49* 3.45*  --  2.48*  CALCIUM 9.1 9.3  --  9.1  PHOS 2.6 3.1  --  3.1   Liver Function Tests: Recent Labs  Lab 01/04/19 0244 01/05/19 0321 01/06/19 0924  ALBUMIN 2.5* 2.6* 2.7*   CBC: Recent Labs  Lab 01/02/19 1641 01/03/19 0535 01/04/19 0244 01/05/19 0321 01/05/19 1141 01/06/19 0924  WBC 16.4* 11.1* 10.6* 13.0*  --  19.9*  NEUTROABS 12.8*  --   --   --   --  17.2*  HGB 9.9* 8.4* 10.4* 9.5* 11.6* 10.8*  HCT 31.2* 25.7* 32.9* 29.9* 34.0* 33.6*  MCV 101.0* 101.6* 100.6* 101.4*  --  101.5*  PLT 524* 382 501* 567*  --  573*   Blood Culture    Component Value Date/Time   SDES BLOOD RIGHT FOREARM 01/02/2019 1641   SDES BLOOD RIGHT HAND 01/02/2019 1641   SPECREQUEST  01/02/2019  [place] Blood Culture results may not be optimal due to an inadequate volume of blood received in culture bottles   SPECREQUEST  01/02/2019 1641    BOTTLES DRAWN AEROBIC ONLY Blood Culture adequate volume   CULT  01/02/2019 1641    NO GROWTH 5 DAYS Performed at [facility], [place] [address], [place], [place] [address]    CULT  01/02/2019 1641    NO GROWTH 5 DAYS Performed at [facility], [place] [address], [place], [place] [address]    REPTSTATUS 01/07/2019 FINAL 01/02/2019 1641   REPTSTATUS 01/07/2019 FINAL 01/02/2019 1641    Cardiac Enzymes: No results for input(s): CKTOTAL, CKMB, CKMBINDEX, TROPONINI in the last 168 hours. CBG: Recent Labs  Lab 01/05/19 2150 01/06/19 0753 01/06/19 1154 01/06/19 1644 01/06/19 2122   GLUCAP 155* 116* 245* 151* 176*   Iron Studies: No results for input(s): IRON, TIBC, TRANSFERRIN, FERRITIN in the last 72 hours. Lab Results  Component Value Date   INR 1.3 (H) 01/05/2019   INR 1.2 12/27/2018   INR 1.7 (H) 12/22/2018   Studies/Results: No results found. Medications:  . apixaban  2.5 mg Oral BID  . [START ON 01/08/2019] calcitRIOL  0.5 mcg Oral Q M,W,F-HD  . docusate sodium  100 mg Oral BID  . feeding supplement (PRO-STAT SUGAR FREE 64)  30 mL Oral BID  . insulin aspart  0-9 Units Subcutaneous TID WC  . multivitamin  1 tablet Oral QHS  . ticagrelor  90 mg Oral BID

## 2019-01-07 NOTE — Progress Notes (Addendum)
Family Medicine Teaching Service Daily Progress Note Intern Pager: [phone]  Patient name: Kristen Berry Medical record number: [id number] Date of birth: Feb 24, 1956 Age: 63 y.o. Gender: female  Primary Care Provider: Martinique, Sarah T, MD Consultants: Manson Passey, vascular surgery, PT/OT Code Status: DNR  Pt Overview and Major Events to Date:  4/7 admitted to fpts 4/10- R BKA   Assessment and Plan: Kristen Berry is a 63 y.o. female presenting with worsening discoloration of right foot and cellulitis after recent amutation of R toe. 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 At home on oxy IR 5 mg q12 hr prn - Follow up with Dr. Sharol Given 1 week post op - continue non weight bearing. - schedule tylenol - for breakthrough pain: oxy IR 5-10 mg q4 hours as needed  - if severe pain beyond that, call FPTS.  -PT/OT recommending SNF  ESRD on HDMWF -neprho following for HD -daily renal function panel.  CoViD-19 exposure Exposed to + hospital personnel during last admission. Per ID pt to be droplet and contact precaution x2 weeks. If continue to be asymptomatic will lift these precautions on 4/15 (last day of quarantine) -d/c to SNF 4/15 if remains asymptomatic   Bed Bugs- noted on admission -Contact precautions  Diabetes mellitus type 2 5.5 on 12/22/2018. cbg 100-156. This am 116 - cbg ac hs - SSI can be added if needed  MGUS Diagnosed in 2017. Patient not currently on any medication. [facility]  HFrEF January 2018 patient had echo that showed LVEF 35 to 40%. Septal and apical akinesis, moderately dilated LV, mild LVH. -monitor I/Os, daily weights  CAD left heart cath in February 2019 showed severe  diffuse diabetic coronary disease, 70 to 80% stenosis of proximal LAD 75% obstruction of RCA  -continue brilinta, ASA   Mild asthma, chronic Hasn't usedher albuterolin months. No other medications. - prn albuterol inhaler  HTN Patient takes amlodipine 5 mg, Coreg 25mg  BID, hydralazine 25 mg BID at home. BP this am 151/56. Currently BP meds on hold.  --Will resume amlodipine 5 mg daily --Add back carvedilol 25 mg bid if continue be elevated  HLD- pt takes 20mg  lipitor at home. -continue home medication  History of left leg DVT patient takes Eliquis 2.5 mg BID for hx of DVT in left leg.  -continue eliquis  Hx of stroke -patient states she had a stroke May of last year. Was treated at [facility] in [place]. Does not appear to have any residual deficits. Patient takes Brilinta 90 mg twice daily.  -continue brilinta  FEN/GI: renal diet PPx: on Eliquis, Aspirin and brilinta  Disposition: SNF  Subjective:  Reports pain is well controlled. No fevers, chills. No cough.  Objective: Temp:  [98 F (36.7 C)-98.1 F (36.7 C)] 98.1 F (36.7 C) (04/12 0612) Pulse Rate:  [85-87] 85 (04/12 0612) Resp:  [18] 18 (04/12 0612) BP: (134-136)/(60-64) 136/64 (04/12 0612) SpO2:  [99 %-100 %] 100 % (04/12 0612)   Physical Exam:  Gen: pleasant elderly lady in NAD Heart: RRR no MRG Lungs: CTA bilaterally, no increased work of breathing Abdomen: soft, non-tender, non-distended, +BS Extremities: wound vac in place on R leg w/ serosanguinous fluid draining Neuro: no focal deficits   Laboratory:  Recent Labs  Lab 01/04/19 0244 01/05/19 0321 01/05/19 1141 01/06/19 0924  WBC 10.6* 13.0*  --  19.9*  HGB 10.4* 9.5* 11.6* 10.8*  HCT 32.9* 29.9* 34.0* 33.6*  PLT 501* 567*  --  573*   Recent Labs  Lab 01/04/19 0244 01/05/19 0321 01/05/19 1141 01/06/19 0924  NA 136 135 135 132*  K 3.5 3.5 3.7 4.2  CL 98 99  --  94*  CO2 25 24  --  24  BUN 11 23  --  18  CREATININE  2.49* 3.45*  --  2.48*  CALCIUM 9.1 9.3  --  9.1  GLUCOSE 98 93 98 263*   Imaging/Diagnostic Tests: CLINICAL DATA:  Redness and swelling of the medial aspect of the right foot. 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  EXAM: RIGHT FOOT COMPLETE - 3+ VIEW  COMPARISON:  Radiographs dated 03/26/202 IMPRESSION: 1. No evidence of osteomyelitis. 2. Edema and small amount of gas in the soft tissues at the site of the previous resection of the first metatarsal and great toe. This could be residual postsurgical edema and air. Infection is not excluded.    Steve Rattler, DO  01/07/2019, 9:45 AM PGY-3, [place] Intern pager: [phone], text pages welcome

## 2019-01-07 NOTE — Evaluation (Signed)
Occupational Therapy Evaluation Patient Details Name: Kristen Berry MRN: [id number] DOB: 11-21-1955 Today's Date: 01/07/2019    History of Present Illness Pt is a 63 y/o female s/p Right BKA. PMH includes ESRD on HD, provoked bilateral DVT after surgery, type 2 diabetes, MGUS, and  HFpEF    Clinical Impression   Pt awoken upon arrival. Pt performing bed mobility with supervisionA; sit to stand x3 with modA and no steps taken. Pt not comfortable getting OOB yet to recliner(even with lateral scooting). Pt set-upA for grooming tasks in sitting EOB with good balance. Pt performing bathing routine with set-upA upon OTR leaving room. Pt progressing and needs to get OOB to recliner for next sessions. Pt continues to progress with ADl and mobility. OT to follow acutely.    Follow Up Recommendations  SNF;Supervision/Assistance - 24 hour    Equipment Recommendations  Other (comment)(to be determined next venue)    Recommendations for Other Services       Precautions / Restrictions Precautions Precautions: Fall;Other (comment) Precaution Comments: contact; droplet - exposure to COVID-19, but tested negative; wound vac Required Braces or Orthoses: Other Brace Other Brace: limb guard right leg Restrictions Weight Bearing Restrictions: Yes RLE Weight Bearing: Non weight bearing      Mobility Bed Mobility Overal bed mobility: Needs Assistance Bed Mobility: Supine to Sit     Supine to sit: Supervision Sit to supine: Supervision   General bed mobility comments: Supervision for safety  Transfers Overall transfer level: Needs assistance Equipment used: Rolling walker (2 wheeled)   Sit to Stand: Mod assist;From elevated surface         General transfer comment: Pt did not want to attempt transfer to recliner, but performed x3 sit to stands.    Balance Overall balance assessment: Needs assistance Sitting-balance support: Feet supported;No upper extremity supported Sitting  balance-Leahy Scale: Good       Standing balance-Leahy Scale: Poor Standing balance comment: RW for support                           ADL either performed or assessed with clinical judgement   ADL Overall ADL's : Needs assistance/impaired     Grooming: Wash/dry hands;Wash/dry face;Oral care;Applying deodorant;Brushing hair;Sitting   Upper Body Bathing: Set up;Sitting                           Functional mobility during ADLs: Rolling walker;Moderate assistance;+2 for physical assistance;+2 for safety/equipment(modA +1 for sit stand) General ADL Comments: set-upA for UB; modA for LB sit to stands     Vision   Vision Assessment?: No apparent visual deficits     Perception     Praxis      Pertinent Vitals/Pain Pain Assessment: Faces Faces Pain Scale: Hurts little more Pain Location: R LE Pain Descriptors / Indicators: Aching;Throbbing Pain Intervention(s): Patient requesting pain meds-RN notified;Limited activity within patient's tolerance     Hand Dominance     Extremity/Trunk Assessment Upper Extremity Assessment Upper Extremity Assessment: RUE deficits/detail   Lower Extremity Assessment Lower Extremity Assessment: Defer to PT evaluation;RLE deficits/detail;Generalized weakness RLE Deficits / Details: right BKA       Communication     Cognition Arousal/Alertness: Awake/alert Behavior During Therapy: WFL for tasks assessed/performed Overall Cognitive Status: Within Functional Limits for tasks assessed  General Comments  education for stump management.    Exercises     Shoulder Instructions      Home Living                                          Prior Functioning/Environment                   OT Problem List:        OT Treatment/Interventions:      OT Goals(Current goals can be found in the care plan section) Acute Rehab OT Goals Patient Stated  Goal: to get over this and get a prosthesis and walk again OT Goal Formulation: With patient Time For Goal Achievement: 01/21/19 Potential to Achieve Goals: Good ADL Goals Pt Will Perform Lower Body Dressing: with modified independence;sitting/lateral leans;sit to/from stand Pt Will Transfer to Toilet: with min assist;squat pivot transfer Pt Will Perform Toileting - Clothing Manipulation and hygiene: with min guard assist;sit to/from stand Pt/caregiver will Perform Home Exercise Program: Both right and left upper extremity;With Supervision;With written HEP provided;Increased strength Additional ADL Goal #1: Pt will transfers with minA for appropriate transfer from bed <-> recliner or bed <-bsc>  OT Frequency: Min 3X/week   Barriers to D/C:            Co-evaluation              AM-PAC OT "6 Clicks" Daily Activity     Outcome Measure Help from another person eating meals?: None Help from another person taking care of personal grooming?: None Help from another person toileting, which includes using toliet, bedpan, or urinal?: A Lot Help from another person bathing (including washing, rinsing, drying)?: A Lot Help from another person to put on and taking off regular upper body clothing?: A Little Help from another person to put on and taking off regular lower body clothing?: A Lot 6 Click Score: 17   End of Session Equipment Utilized During Treatment: Rolling walker Nurse Communication: Mobility status  Activity Tolerance: Patient tolerated treatment well Patient left: in bed;with call bell/phone within reach;with bed alarm set  OT Visit Diagnosis: Unsteadiness on feet (R26.81);Muscle weakness (generalized) (M62.81);Other abnormalities of gait and mobility (R26.89);Pain Pain - Right/Left: Right Pain - part of body: Leg                Time: 4097-3532 OT Time Calculation (min): 28 min Charges:  OT General Charges $OT Visit: 1 Visit OT Treatments $Self Care/Home Management :  8-22 mins $Therapeutic Activity: 8-22 mins  Darryl Nestle) Marsa Aris OTR/L Acute Rehabilitation Services Pager: [phone] Office: [phone]   Audie Pinto 01/07/2019, 3:53 PM

## 2019-01-08 DIAGNOSIS — Z89511 Acquired absence of right leg below knee: Secondary | ICD-10-CM

## 2019-01-08 LAB — CBC
HCT: 32 % — ABNORMAL LOW (ref 36.0–46.0)
Hemoglobin: 10.1 g/dL — ABNORMAL LOW (ref 12.0–15.0)
MCH: 32.1 pg (ref 26.0–34.0)
MCHC: 31.6 g/dL (ref 30.0–36.0)
MCV: 101.6 fL — ABNORMAL HIGH (ref 80.0–100.0)
Platelets: 493 10*3/uL — ABNORMAL HIGH (ref 150–400)
RBC: 3.15 MIL/uL — ABNORMAL LOW (ref 3.87–5.11)
RDW: 15.9 % — ABNORMAL HIGH (ref 11.5–15.5)
WBC: 15.5 10*3/uL — ABNORMAL HIGH (ref 4.0–10.5)
nRBC: 0.1 % (ref 0.0–0.2)

## 2019-01-08 LAB — GLUCOSE, CAPILLARY
Glucose-Capillary: 88 mg/dL (ref 70–99)
Glucose-Capillary: 95 mg/dL (ref 70–99)
Glucose-Capillary: 131 mg/dL — ABNORMAL HIGH (ref 70–99)
Glucose-Capillary: 145 mg/dL — ABNORMAL HIGH (ref 70–99)

## 2019-01-08 LAB — BASIC METABOLIC PANEL
Anion gap: 17 — ABNORMAL HIGH (ref 5–15)
BUN: 59 mg/dL — ABNORMAL HIGH (ref 8–23)
CO2: 23 mmol/L (ref 22–32)
Calcium: 9.2 mg/dL (ref 8.9–10.3)
Chloride: 93 mmol/L — ABNORMAL LOW (ref 98–111)
Creatinine, Ser: 3.94 mg/dL — ABNORMAL HIGH (ref 0.44–1.00)
GFR calc Af Amer: 13 mL/min — ABNORMAL LOW (ref >60)
GFR calc non Af Amer: 11 mL/min — ABNORMAL LOW (ref >60)
Glucose, Bld: 124 mg/dL — ABNORMAL HIGH (ref 70–99)
Potassium: 3.7 mmol/L (ref 3.5–5.1)
Sodium: 133 mmol/L — ABNORMAL LOW (ref 135–145)

## 2019-01-08 MED ORDER — LIDOCAINE 5 % EX PTCH
1.0000 | MEDICATED_PATCH | CUTANEOUS | Status: DC
  Administered 2019-01-08 – 2019-01-09 (×2): 1 via TRANSDERMAL
  Filled 2019-01-08 (×2): qty 1

## 2019-01-08 NOTE — TOC Progression Note (Addendum)
Transition of Care [facility]) - Progression Note    Patient Details  Name: Kristen Berry MRN: [id number] Date of Birth: 1956-04-11  Transition of Care [facility]) CM/SW [place], [place] Phone Number: 01/08/2019, 12:43 PM  Clinical Narrative:    1:36pm- Sent vital signs to [place], [place] leadership requesting 2nd COVID test, passed on that pt does not currently meet criterion, sent info to [phone]  12:43pmFaxed clinicals to [facility] for insurance authorization.   Expected Discharge Plan: [place] Barriers to Discharge: Continued Medical Work up, Ship broker  Expected Discharge Plan and Services Expected Discharge Plan: [place] Choice: [place] arrangements for the past 2 months: Mobile Home    Social Determinants of Health (SDOH) Interventions    Readmission Risk Interventions No flowsheet data found.

## 2019-01-08 NOTE — Progress Notes (Signed)
Physical medicine rehabilitation consult requested chart reviewed. Physical as well as occupational therapy evaluations completed with recommendations at SNF. Hold on formal rehabilitation consult at this time.

## 2019-01-08 NOTE — Progress Notes (Signed)
Inpatient Rehabilitation-Admissions Coordinator   Please see note by PM&R PA Silvestre Mesi for details. Recommendations are for return to SNF at DC. AC will sign off and communicate with CM/SW.   Jhonnie Garner, OTR/L  Rehab Admissions Coordinator  [phone] 01/08/2019 8:47 AM

## 2019-01-08 NOTE — Progress Notes (Addendum)
Family Medicine Teaching Service Daily Progress Note Intern Pager: [phone]  Patient name: Kristen Berry Medical record number: [id number] Date of birth: March 06, 1956 Age: 63 y.o. Gender: female  Primary Care Provider: Martinique, Sarah T, MD Consultants: Manson Passey, vascular surgery, PT/OT Code Status: DNR  Pt Overview and Major Events to Date:  4/7 admitted to fpts 4/10- R BKA   Assessment and Plan: Kristen Berry is a 63 y.o. female presenting with worsening discoloration of right foot and cellulitis after recent amutation of R toe. 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 At home on oxy IR 5 mg q12 hr prn - Follow up with Dr. Sharol Given 1 week post op - continue non weight bearing. - schedule tylenol - for breakthrough pain: oxy IR 5-10 mg q4 hours as needed  - if severe pain beyond that, call FPTS.  -PT/OT recommending SNF  ESRD on HDMWF -neprho following for HD on 4/13 -daily renal function panel.  CoViD-19 exposure Exposed to + hospital personnel during last admission. Per ID pt to be droplet and contact precaution x2 weeks. If continue to be asymptomatic will lift these precautions on 4/15 (last day of quarantine) -d/c to SNF 4/15 if remains asymptomatic   Bed Bugs- noted on admission -Contact precautions  Diabetes mellitus type 2 5.5 on 12/22/2018. cbg mid to low 100s. - cbg ac hs - SSI can be added if needed  MGUS Diagnosed in 2017. Patient not currently on any medication. [facility]  HFrEF January 2018 patient had echo that showed LVEF 35 to 40%. Septal and apical akinesis, moderately dilated LV, mild LVH. -monitor I/Os, daily weights  CAD left heart cath in February 2019 showed  severe diffuse diabetic coronary disease, 70 to 80% stenosis of proximal LAD 75% obstruction of RCA  -continue brilinta, ASA   Mild asthma, chronic Hasn't usedher albuterolin months. No other medications. - prn albuterol inhaler  HTN Patient takes amlodipine 5 mg, Coreg 25mg  BID, hydralazine 25 mg BID at home. Systolic 366Y, diastolic 40H. Currently BP meds on hold.  --Will resume amlodipine 5 mg daily at discharge --Add back carvedilol 25 mg bid if continue be elevated  HLD- pt takes 20mg  lipitor at home. -continue home medication  History of left leg DVT patient takes Eliquis 2.5 mg BID for hx of DVT in left leg.  -continue eliquis  Hx of stroke -patient states she had a stroke May of last year. Was treated at [facility] in [place]. Does not appear to have any residual deficits. Patient takes Brilinta 90 mg twice daily.  -continue brilinta  FEN/GI: renal diet PPx: on Eliquis, Aspirin and brilinta  Disposition: SNF expected 4/15.   Subjective:  Patient feels well today. No complaints. Hopeful to get out of hospital tomorrow.  Objective: Temp:  [97.5 F (36.4 C)-98.4 F (36.9 C)] 97.7 F (36.5 C) (04/13 0544) Pulse Rate:  [89-90] 90 (04/13 0544) Resp:  [18] 18 (04/13 0544) BP: (132-145)/(49-54) 135/54 (04/13 0544) SpO2:  [100 %] 100 % (04/13 0544)   Physical Exam: General: well nourished, well developed, NAD with non-toxic appearance HEENT: normocephalic, atraumatic, moist mucous membranes Cardiovascular: regular rate and rhythm without murmurs, rubs, or gallops Lungs: clear to auscultation bilaterally with normal work of  breathing Abdomen: soft, non-tender, non-distended, normoactive bowel sounds Skin: warm, dry, no rashes or lesions, cap refill < 2 seconds, no erythema at left BKA site Extremities: warm and well perfused, normal tone, no edema, left BKA with minimal drainage on [facility]  Laboratory: Recent Labs  Lab 01/05/19 0321 01/05/19 1141  01/06/19 0924 01/08/19 0338  WBC 13.0*  --  19.9* 15.5*  HGB 9.5* 11.6* 10.8* 10.1*  HCT 29.9* 34.0* 33.6* 32.0*  PLT 567*  --  573* 493*   Recent Labs  Lab 01/05/19 0321 01/05/19 1141 01/06/19 0924 01/08/19 0338  NA 135 135 132* 133*  K 3.5 3.7 4.2 3.7  CL 99  --  94* 93*  CO2 24  --  24 23  BUN 23  --  18 59*  CREATININE 3.45*  --  2.48* 3.94*  CALCIUM 9.3  --  9.1 9.2  GLUCOSE 93 98 263* 124*   Imaging/Diagnostic Tests: EKG 12-LEAD (01/05/2019) Sinus rhythm 70 bpm, QTc 460, 1st degree block, no ST changes or t-wave abnormailities  RIGHT FOOT COMPLETE - 3+ VIEW (01/02/2019) IMPRESSION: 1. No evidence of osteomyelitis. 2. Edema and small amount of gas in the soft tissues at the site of the previous resection of the first metatarsal and great toe. This could be residual postsurgical edema and air. Infection is not excluded.    Burkburnett Bing, DO  01/08/2019, 7:18 AM PGY-3, [place] Intern pager: [phone], text pages welcome

## 2019-01-08 NOTE — Progress Notes (Addendum)
Kristen Berry Progress Note   Subjective: No C/Os. Plan to DC home tomorrow  post HD. Will return to SNF.  No BB reported.    Objective Vitals:   01/07/19 0612 01/07/19 1608 01/07/19 2048 01/08/19 0544  BP: 136/64 (!) 132/49 (!) 145/51 (!) 135/54  Pulse: 85 90 89 90  Resp: 18 18 18 18   Temp: 98.1 F (36.7 C) (!) 97.5 F (36.4 C) 98.4 F (36.9 C) 97.7 F (36.5 C)  TempSrc: Oral Oral Oral Oral  SpO2: 100% 100% 100% 100%  Weight:      Height:       Physical Exam General: Pleasant older female in NAD Heart: S1,S2 RRR Lungs: CTAB A/P Abdomen: Active BS Extremities: R BKA wound vac in place. No LLE edema.  Dialysis Access: [facility] CDI, L AVF + bruit.   Additional Objective Labs: Basic Metabolic Panel: Recent Labs  Lab 01/04/19 0244 01/05/19 0321 01/05/19 1141 01/06/19 0924 01/08/19 0338  NA 136 135 135 132* 133*  K 3.5 3.5 3.7 4.2 3.7  CL 98 99  --  94* 93*  CO2 25 24  --  24 23  GLUCOSE 98 93 98 263* 124*  BUN 11 23  --  18 59*  CREATININE 2.49* 3.45*  --  2.48* 3.94*  CALCIUM 9.1 9.3  --  9.1 9.2  PHOS 2.6 3.1  --  3.1  --    Liver Function Tests: Recent Labs  Lab 01/04/19 0244 01/05/19 0321 01/06/19 0924  ALBUMIN 2.5* 2.6* 2.7*   No results for input(s): LIPASE, AMYLASE in the last 168 hours. CBC: Recent Labs  Lab 01/02/19 1641 01/03/19 0535 01/04/19 0244 01/05/19 0321 01/05/19 1141 01/06/19 0924 01/08/19 0338  WBC 16.4* 11.1* 10.6* 13.0*  --  19.9* 15.5*  NEUTROABS 12.8*  --   --   --   --  17.2*  --   HGB 9.9* 8.4* 10.4* 9.5* 11.6* 10.8* 10.1*  HCT 31.2* 25.7* 32.9* 29.9* 34.0* 33.6* 32.0*  MCV 101.0* 101.6* 100.6* 101.4*  --  101.5* 101.6*  PLT 524* 382 501* 567*  --  573* 493*   Blood Culture    Component Value Date/Time   SDES BLOOD RIGHT FOREARM 01/02/2019 1641   SDES BLOOD RIGHT HAND 01/02/2019 1641   SPECREQUEST  01/02/2019 1641    BOTTLES DRAWN AEROBIC ONLY Blood Culture results may not be optimal due to an  inadequate volume of blood received in culture bottles   SPECREQUEST  01/02/2019 1641    BOTTLES DRAWN AEROBIC ONLY Blood Culture adequate volume   CULT  01/02/2019 1641    NO GROWTH 5 DAYS Performed at [facility], [place] [address], [place], [place] [address]    CULT  01/02/2019 1641    NO GROWTH 5 DAYS Performed at [facility], [place] [address] [place],  [address]    REPTSTATUS 01/07/2019 FINAL 01/02/2019 1641   REPTSTATUS 01/07/2019 FINAL 01/02/2019 1641    Cardiac Enzymes: No results for input(s): CKTOTAL, CKMB, CKMBINDEX, TROPONINI in the last 168 hours. CBG: Recent Labs  Lab 01/07/19 1246 01/07/19 1611 01/07/19 1749 01/07/19 2056 01/08/19 0846  GLUCAP 125* 158* 137* 127* 88   Iron Studies: No results for input(s): IRON, TIBC, TRANSFERRIN, FERRITIN in the last 72 hours. @lablastinr3 @ Studies/Results: No results found. Medications:  . acetaminophen  325-650 mg Oral Q6H  . apixaban  2.5 mg Oral BID  . calcitRIOL  0.5 mcg Oral Q M,W,F-HD  . Chlorhexidine  Gluconate Cloth  6 each Topical Q0600  . docusate sodium  100 mg Oral BID  . feeding supplement (PRO-STAT SUGAR FREE 64)  30 mL Oral BID  . insulin aspart  0-9 Units Subcutaneous TID WC  . multivitamin  1 tablet Oral QHS  . 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 Sharol Given.Rec'dvanc and cefepime.  Now off ABX. BC NGTD X 2 days. Per primary. WBC 19.9 4/11-WBC 15.5 today. Afebrile overnight.  2. Hypokalemia: K+ 3.7. Continue 3.0 K bath as OP.  3. ESRD -MWF. No heparin.Has been done as separate D/T BB infestation/COVID exposures. Will have HD off schedule tomorrow D/T staffing. K+ 3.7. Use 4.0 K bath. No heparin.  4. Anemia -HGB10.1 Rec'dAranesp 100 mcg IV 01/03/2019. Follow HGB. 5. Secondary  hyperparathyroidism - Phos 3.1. Held binders. Decreased VDRA to 0.5 mcg PO due to ^ corr Ca. Follow labs. 6.HTN/volume -HD 01/06/19 Pre wt 80.9 Net UF 2 liters Post wt 78.4 kg. Make new EDW 78 kg. 7. Nutrition -albumin 2.5.renal diet added prostatenal vits.  8. Known COVID 19 exposure during last hospitalization-Tested negative 12/27/2018. No symptoms. On droplet precautions. Has been on 14 day isolation which ends 01/09/19. 9.Active bed bug infestation. Had brief exposure after last d/c from SNF. To be d/c to [place].  10. H/O chronic systolic HF EF 16-10%. Volume is stable. 11. H/O CAD-ASA,BB, Eliquis-no chest pain. 12. Poorly developing L AVF-had fistulogram on 4/1 Dr Trula Slade planning fistula elevation and branch ligation procedure in the future - will need to have DAPT stopped 48 hours in advance of future ortho or vascular procedures  13. DNR  Kristen Berry. Brown NP-C 01/08/2019, 9:06 AM  [facility] [phone]  Pt seen, examined and agree w A/P as above. Pt can be done upstairs tomorrow as the 14 day period since her COVID exposure will be up.   [facility] 01/08/2019, 3:02 PM

## 2019-01-08 NOTE — Progress Notes (Signed)
Patient ID: Kristen Berry, female   DOB: [birth year], 63 y.o.   MRN: [id number] Patient is in good spirits this morning.  Postoperative day 3 status post right transtibial amputation.  The wound VAC has no drainage.  Plan for discharge back to skilled nursing on Tuesday.

## 2019-01-08 NOTE — Progress Notes (Signed)
Physical Therapy Treatment Patient Details Name: Kristen Berry MRN: [id number] DOB: 1956/08/07 Today's Date: 01/08/2019    History of Present Illness Pt is a 63 y/o female s/p Right BKA 01/05/19. PMH includes ESRD on HD, provoked bilateral DVT after surgery, type 2 diabetes, MGUS, and  HFpEF.  Pt was exposed to COVID (+) pt on her last admission and is on quarietene through 01/09/19.  She has thus far tested (-).     PT Comments    PT remains anxious and fearful of falling.  She did not want to attempt standing with RW today, but was agreeable to lateral scoot OOB to the recliner chair.  We reviewed her HEP, but I forgot to bring the paper copy into the room (still on droplet precautions), so will attempt to remember it next session.  It is already printed and in her paper chart outside of the room.  PT will continue to follow acutely for safe mobility progression  Follow Up Recommendations  SNF     Equipment Recommendations  Rolling walker with 5" wheels;3in1 (PT);Other (comment);Wheelchair cushion (measurements PT);Wheelchair (measurements PT)(drop arm 3-in-1, WC with right amputee pad)    Recommendations for Other Services   NA     Precautions / Restrictions Precautions Precautions: Fall;Other (comment) Precaution Comments: contact; droplet - exposure to COVID-19, but tested negative, on droplet/quarientiened through 01/09/19.; wound vac Other Brace: limb guard right leg Restrictions Weight Bearing Restrictions: Yes RLE Weight Bearing: Non weight bearing    Mobility  Bed Mobility Overal bed mobility: Needs Assistance Bed Mobility: Rolling;Supine to Sit Rolling: Supervision   Supine to sit: Supervision     General bed mobility comments: Supervision for safety, reliant on bed rail to roll and come to sitting EOB.   Transfers Overall transfer level: Needs assistance   Transfers: Lateral/Scoot Transfers          Lateral/Scoot Transfers: Min assist;From elevated  surface General transfer comment: Min assist to scoot OOB to the recliner chair with one person min assist to help protect her right lower leg.  Verbal cues for sequencing and safety.          Balance Overall balance assessment: Needs assistance Sitting-balance support: Feet supported;No upper extremity supported Sitting balance-Leahy Scale: Good                                      Cognition Arousal/Alertness: Awake/alert Behavior During Therapy: WFL for tasks assessed/performed Overall Cognitive Status: Within Functional Limits for tasks assessed                                 General Comments: Pt more flat today, processing her new normal      Exercises Amputee Exercises Quad Sets: AROM;Right;10 reps Hip Extension: AROM;Right;10 reps;Sidelying Hip ABduction/ADduction: AROM;Right;10 reps;Supine;Sidelying    General Comments General comments (skin integrity, edema, etc.): Pt is very fearful of standing and falling.  More comfortable with lateral scoot transfer today.        Pertinent Vitals/Pain Pain Assessment: Faces Faces Pain Scale: Hurts even more Pain Location: R LE, reporting phantom pain/sensations Pain Descriptors / Indicators: Grimacing;Guarding Pain Intervention(s): Limited activity within patient's tolerance;Monitored during session;Repositioned           PT Goals (current goals can now be found in the care plan section) Acute Rehab PT Goals Patient Stated Goal:  to gain confidence, get a prosthesis and get on with her life. Progress towards PT goals: Progressing toward goals    Frequency    Min 2X/week      PT Plan Current plan remains appropriate       AM-PAC PT "6 Clicks" Mobility   Outcome Measure  Help needed turning from your back to your side while in a flat bed without using bedrails?: A Little Help needed moving from lying on your back to sitting on the side of a flat bed without using bedrails?: A  Little Help needed moving to and from a bed to a chair (including a wheelchair)?: A Little Help needed standing up from a chair using your arms (e.g., wheelchair or bedside chair)?: A Lot Help needed to walk in hospital room?: Total Help needed climbing 3-5 steps with a railing? : Total 6 Click Score: 13    End of Session   Activity Tolerance: Patient limited by pain;Other (comment)(limited by fear of falling) Patient left: in chair;with call bell/phone within reach   PT Visit Diagnosis: Difficulty in walking, not elsewhere classified (R26.2);Pain;Muscle weakness (generalized) (M62.81) Pain - Right/Left: Right Pain - part of body: Leg     Time: 5697-9480 PT Time Calculation (min) (ACUTE ONLY): 28 min  Charges:  $Therapeutic Exercise: 8-22 mins $Therapeutic Activity: 8-22 mins                    Kristen Berry B. Savahna Casados, PT, DPT  Acute Rehabilitation [phone] pager #[phone] office   01/08/2019, 11:56 AM

## 2019-01-09 ENCOUNTER — Ambulatory Visit (INDEPENDENT_AMBULATORY_CARE_PROVIDER_SITE_OTHER): Payer: Self-pay | Admitting: Orthopedic Surgery

## 2019-01-09 DIAGNOSIS — S88119A Complete traumatic amputation at level between knee and ankle, unspecified lower leg, initial encounter: Secondary | ICD-10-CM

## 2019-01-09 LAB — BASIC METABOLIC PANEL
Anion gap: 13 (ref 5–15)
BUN: 74 mg/dL — ABNORMAL HIGH (ref 8–23)
CO2: 22 mmol/L (ref 22–32)
Calcium: 8.9 mg/dL (ref 8.9–10.3)
Chloride: 96 mmol/L — ABNORMAL LOW (ref 98–111)
Creatinine, Ser: 4.31 mg/dL — ABNORMAL HIGH (ref 0.44–1.00)
GFR calc Af Amer: 12 mL/min — ABNORMAL LOW (ref >60)
GFR calc non Af Amer: 10 mL/min — ABNORMAL LOW (ref >60)
Glucose, Bld: 106 mg/dL — ABNORMAL HIGH (ref 70–99)
Potassium: 4.1 mmol/L (ref 3.5–5.1)
Sodium: 131 mmol/L — ABNORMAL LOW (ref 135–145)

## 2019-01-09 LAB — CBC
HCT: 32.5 % — ABNORMAL LOW (ref 36.0–46.0)
Hemoglobin: 10.1 g/dL — ABNORMAL LOW (ref 12.0–15.0)
MCH: 31.7 pg (ref 26.0–34.0)
MCHC: 31.1 g/dL (ref 30.0–36.0)
MCV: 101.9 fL — ABNORMAL HIGH (ref 80.0–100.0)
Platelets: 514 10*3/uL — ABNORMAL HIGH (ref 150–400)
RBC: 3.19 MIL/uL — ABNORMAL LOW (ref 3.87–5.11)
RDW: 16 % — ABNORMAL HIGH (ref 11.5–15.5)
WBC: 13.4 10*3/uL — ABNORMAL HIGH (ref 4.0–10.5)
nRBC: 0.2 % (ref 0.0–0.2)

## 2019-01-09 LAB — GLUCOSE, CAPILLARY
Glucose-Capillary: 105 mg/dL — ABNORMAL HIGH (ref 70–99)
Glucose-Capillary: 102 mg/dL — ABNORMAL HIGH (ref 70–99)
Glucose-Capillary: 116 mg/dL — ABNORMAL HIGH (ref 70–99)
Glucose-Capillary: 108 mg/dL — ABNORMAL HIGH (ref 70–99)

## 2019-01-09 MED ORDER — HEPARIN SODIUM (PORCINE) 1000 UNIT/ML IJ SOLN
3.2000 mL | Freq: Once | INTRAMUSCULAR | Status: AC
  Administered 2019-01-09: 3200 [IU] via INTRAVENOUS

## 2019-01-09 MED ORDER — HEPARIN SODIUM (PORCINE) 1000 UNIT/ML IJ SOLN
INTRAMUSCULAR | Status: AC
  Filled 2019-01-09: qty 4

## 2019-01-09 MED ORDER — ACETAMINOPHEN 325 MG PO TABS
325.0000 mg | ORAL_TABLET | Freq: Four times a day (QID) | ORAL | Status: DC | PRN
  Administered 2019-01-09 – 2019-01-10 (×2): 650 mg via ORAL
  Filled 2019-01-09 (×2): qty 2

## 2019-01-09 NOTE — Progress Notes (Signed)
Pt returned to 6N16 from dialysis. Received report from [place], Therapist, sports. See assessment. Will continue to monitor.

## 2019-01-09 NOTE — Progress Notes (Addendum)
[facility] Progress Note   Subjective: Seen on HD, tolerating well.   Objective Vitals:   01/09/19 0824 01/09/19 0829 01/09/19 0900 01/09/19 0930  BP: (!) 142/62 137/61 131/63 (!) (P) 117/55  Pulse: (!) 103 (!) 102 (!) 101 (!) (P) 108  Resp: 13 14 17  (P) 16  Temp:      TempSrc:      SpO2:      Weight:      Height:       Physical Exam General: Pleasant older female in NAD Heart: S1,S2 RRR Lungs: CTAB A/P Abdomen: Active BS Extremities: R BKA wound vac in place. No LLE edema.  Dialysis Access: [facility] CDI blood lines connected, L AVF + bruit   Additional Objective Labs: Basic Metabolic Panel: Recent Labs  Lab 01/04/19 0244 01/05/19 0321  01/06/19 0924 01/08/19 0338 01/09/19 0219  NA 136 135   < > 132* 133* 131*  K 3.5 3.5   < > 4.2 3.7 4.1  CL 98 99  --  94* 93* 96*  CO2 25 24  --  24 23 22   GLUCOSE 98 93   < > 263* 124* 106*  BUN 11 23  --  18 59* 74*  CREATININE 2.49* 3.45*  --  2.48* 3.94* 4.31*  CALCIUM 9.1 9.3  --  9.1 9.2 8.9  PHOS 2.6 3.1  --  3.1  --   --    < > = values in this interval not displayed.   Liver Function Tests: Recent Labs  Lab 01/04/19 0244 01/05/19 0321 01/06/19 0924  ALBUMIN 2.5* 2.6* 2.7*   No results for input(s): LIPASE, AMYLASE in the last 168 hours. CBC: Recent Labs  Lab 01/02/19 1641  01/04/19 0244 01/05/19 0321  01/06/19 0924 01/08/19 0338 01/09/19 0219  WBC 16.4*   < > 10.6* 13.0*  --  19.9* 15.5* 13.4*  NEUTROABS 12.8*  --   --   --   --  17.2*  --   --   HGB 9.9*   < > 10.4* 9.5*   < > 10.8* 10.1* 10.1*  HCT 31.2*   < > 32.9* 29.9*   < > 33.6* 32.0* 32.5*  MCV 101.0*   < > 100.6* 101.4*  --  101.5* 101.6* 101.9*  PLT 524*   < > 501* 567*  --  573* 493* 514*   < > = values in this interval not displayed.   Blood Culture    Component Value Date/Time   SDES BLOOD RIGHT FOREARM 01/02/2019 1641   SDES BLOOD RIGHT HAND 01/02/2019 1641   SPECREQUEST  01/02/2019 1641    BOTTLES DRAWN  AEROBIC ONLY Blood Culture results may not be optimal due to an inadequate volume of blood received in culture bottles   SPECREQUEST  01/02/2019 1641    BOTTLES DRAWN AEROBIC ONLY Blood Culture adequate volume   CULT  01/02/2019 1641    NO GROWTH 5 DAYS Performed at [facility], [place] [address] [place], [place] [address]    CULT  01/02/2019 1641    NO GROWTH 5 DAYS Performed at [facility], [place] [address], [place], [place] [address]    REPTSTATUS 01/07/2019 FINAL 01/02/2019 1641   REPTSTATUS 01/07/2019 FINAL 01/02/2019 1641    Cardiac Enzymes: No results for input(s): CKTOTAL, CKMB, CKMBINDEX, TROPONINI in the last 168 hours. CBG: Recent Labs  Lab 01/08/19 0846 01/08/19 1300 01/08/19 1639 01/08/19 2058 01/09/19 0750  GLUCAP 88 95  131* 145* 105*   Iron Studies: No results for input(s): IRON, TIBC, TRANSFERRIN, FERRITIN in the last 72 hours. @lablastinr3 @ Studies/Results: No results found. Medications:  . apixaban  2.5 mg Oral BID  . calcitRIOL  0.5 mcg Oral Q M,W,F-HD  . Chlorhexidine Gluconate Cloth  6 each Topical Q0600  . docusate sodium  100 mg Oral BID  . feeding supplement (PRO-STAT SUGAR FREE 64)  30 mL Oral BID  . insulin aspart  0-9 Units Subcutaneous TID WC  . lidocaine  1 patch Transdermal Q24H  . multivitamin  1 tablet Oral QHS  . 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 Sharol Given.Rec'dvanc and cefepime. Now off ABX. BC NGTD X 5 days. Per primary.WBC 19.9 4/11-WBC 13.4. Afebrile overnight.  2. Hypokalemia: K+4.1. Continue 3.0 K bath as OP.  3. ESRD -MWF. No heparin.Has been done as separate D/T BB infestation/COVID exposures but having HD in unit today. HD off schedule today D/T staffing. Short HD tomorrow to get back on MWF schedule. No heparin.  4.  Anemia -HGB10.1Rec'dAranesp 100 mcg IV 01/03/2019. Follow HGB. 5. Secondary hyperparathyroidism - Phos 3.1. Held binders. Decreased VDRA to 0.5 mcg PO due to ^ corr Ca. Follow labs. 6.HTN/volume -New EDW 78 kg. On HD pre wt 81.5. HR ^ 113 decreased UFG to 2.0. Otherwise tolerating HD well.  7. Nutrition -albumin 2.5.renal diet added prostatenal vits.  8. Known COVID 19 exposure during last hospitalization-Tested negative 12/27/2018. No symptoms. On droplet precautions. Has been on 14 day isolation which ends 01/09/19. 9.Active bed bug infestation. Had brief exposure after last d/c from SNF. To be d/c to [facility].  10. H/O chronic systolic HF EF 42-39%. Volume is stable. 11. H/O CAD-ASA,BB, Eliquis-no chest pain. 12. Poorly developing L AVF-had fistulogram on 4/1 Dr Trula Slade planning fistula elevation and branch ligation procedure in the future - will need to have DAPT stopped 48 hours in advance of future ortho or vascular procedures  13. MGUS-Has been seen by Dr. Alvy Bimler.  27. H/O CVA  Disposition: Plan to return to SNF tomorrow.   Rita H. Brown NP-C 01/09/2019, 10:28 AM  [facility] [phone]  Pt seen, examined and agree w A/P as above.  [facility] 01/09/2019, 4:22 PM

## 2019-01-09 NOTE — Progress Notes (Signed)
Patient ID: Kristen Berry, female   DOB: [birth year], 63 y.o.   MRN: [id number] Patient alert and oriented this morning.  Plan for discharge to skilled nursing.  There is no drainage in the wound VAC canister.  Plan for discharge on the portable Praveena pump.

## 2019-01-09 NOTE — Social Work (Signed)
Received consult regarding SNF placement- please see progress notes dated 4/9, 4/13, 4/14. Pt is set up to go to SNF tomorrow, CSW got authorization and has been in contact with Kirke Shaggy daily. Will need d/c summary, orders, and signed prescriptions for controlled substances printed for SNF.  CSW continuing to follow.  Westley Hummer, MSW, [place] Work [phone]

## 2019-01-09 NOTE — Progress Notes (Signed)
Family Medicine Teaching Service Daily Progress Note      FPTS Intern pager: [phone], text pages welcome  Patient name: Kristen Berry Medical record number: [id number] Date of birth: 06-23-1956 Age: 63 y.o. Gender: female  Primary Care Provider: [place], Sarah T, MD Consultants: Manson Passey, vascular surgery, PT/OT Code Status: DNR  Pt Overview and Major Events to Date:  4/7 admitted to fpts 4/10- R BKA   Assessment and Plan: Kristen Berry is a 63 y.o. female presenting with worsening discoloration of right foot and cellulitis after recent amutation of R toe. PMH is significant forosteomyelitis of the left toes s/p amputation, ESRD, diabetes type 2, hypertension, history of DVT, history of stroke, CHF, MGUS  Worsening necrosis of right second toe and foot, right foot cellulitis s/p right BKA 4/10 Wound vac in place, will discharge w/ praveena pump. At home on oxy IR 5 mg q12 hr prn - continue non weight bearing -follow up w/ ortho outpatient -tylenol as needed for pain -PT/OT recommending SNF  ESRD on HDMWF -nephro following for HD -daily renal function panel.  CoViD-19 exposure Exposed to + hospital personnel during last admission. Per ID pt to be droplet and contact precaution x2 weeks. If continue to be asymptomatic will lift these precautions on 4/15 (last day of quarantine) -d/c to SNF 4/15 if remains asymptomatic   Bed Bugs- noted on admission -Contact precautions  Diabetes mellitus type 2 5.5 on 12/22/2018. cbg mid to low 100s. - cbg ac hs - SSI can be added if needed  MGUS Diagnosed in 2017. Patient not currently on any medication. [facility]  HFrEF January 2018 patient had echo that showed LVEF 35 to 40%. Septal and apical akinesis, moderately dilated LV, mild LVH. -monitor I/Os, daily weights  CAD left heart cath in February 2019 showed severe diffuse diabetic coronary disease, 70 to 80% stenosis of proximal LAD 75%  obstruction of RCA  -continue brilinta, ASA   Mild asthma, chronic Hasn't usedher albuterolin months. No other medications. - prn albuterol inhaler  HTN Patient takes amlodipine 5 mg, Coreg 25mg  BID, hydralazine 25 mg BID at home. Systolic 809X, diastolic 83J. Currently BP meds on hold.  --Will resume amlodipine 5 mg daily at discharge --Add back carvedilol 25 mg bid if continue be elevated  HLD- pt takes 20mg  lipitor at home. -continue home medication  History of left leg DVT patient takes Eliquis 2.5 mg BID for hx of DVT in left leg.  -continue eliquis  Hx of stroke -patient states she had a stroke May of last year. Was treated at [facility] in [place]. Does not appear to have any residual deficits. Patient takes Brilinta 90 mg twice daily.  -continue brilinta  FEN/GI: renal diet PPx: on Eliquis, Aspirin and brilinta  Disposition: SNF expected 4/15.   Subjective:  Interviewed patient in HD, she reports feeling well, no complaints, denies pain.   Objective: Temp:  [97.3 F (36.3 C)-98.8 F (37.1 C)] 97.3 F (36.3 C) (04/14 0746) Pulse Rate:  [81-109] 101 (04/14 0900) Resp:  [13-18] 17 (04/14 0900) BP: (122-171)/(61-144) 131/63 (04/14 0900) SpO2:  [100 %] 100 % (04/14 0421)   Physical Exam:  Gen: pleasant elderly lady in HD in NAD Heart: RRR no MRG Lungs: CTA bilaterally, no increased work of breathing Abdomen: soft, non-tender, non-distended, +BS  Extremities: right BKA Neuro: no focal deficits   Laboratory: Recent Labs  Lab 01/06/19 0924 01/08/19 0338 01/09/19 0219  WBC 19.9* 15.5*  13.4*  HGB 10.8* 10.1* 10.1*  HCT 33.6* 32.0* 32.5*  PLT 573* 493* 514*   Recent Labs  Lab 01/06/19 0924 01/08/19 0338 01/09/19 0219  NA 132* 133* 131*  K 4.2 3.7 4.1  CL 94* 93* 96*  CO2 24 23 22   BUN 18 59* 74*  CREATININE 2.48* 3.94* 4.31*  CALCIUM 9.1 9.2 8.9  GLUCOSE 263* 124* 106*   Imaging/Diagnostic Tests: EKG 12-LEAD  (01/05/2019) Sinus rhythm 70 bpm, QTc 460, 1st degree block, no ST changes or t-wave abnormailities  RIGHT FOOT COMPLETE - 3+ VIEW (01/02/2019) IMPRESSION: 1. No evidence of osteomyelitis. 2. Edema and small amount of gas in the soft tissues at the site of the previous resection of the first metatarsal and great toe. This could be residual postsurgical edema and air. Infection is not excluded.    Steve Rattler, DO  01/09/2019, 9:04 AM PGY-3, [place] Intern pager: [phone], text pages welcome

## 2019-01-09 NOTE — Plan of Care (Signed)
Problem: Clinical Measurements: Goal: Ability to maintain clinical measurements within normal limits will improve Outcome: Progressing Goal: Will remain free from infection Outcome: Progressing   Problem: Nutrition: Goal: Adequate nutrition will be maintained Outcome: Progressing   Problem: Coping: Goal: Level of anxiety will decrease Outcome: Progressing   Problem: Pain Managment: Goal: General experience of comfort will improve Outcome: Progressing   Problem: Safety: Goal: Ability to remain free from injury will improve Outcome: Progressing   Problem: Skin Integrity: Goal: Risk for impaired skin integrity will decrease Outcome: Progressing   Problem: Education: Goal: Understanding of discharge needs will improve Outcome: Progressing   Problem: Clinical Measurements: Goal: Postoperative complications will be avoided or minimized Outcome: Progressing   Problem: Self-Care: Goal: Ability to meet self-care needs will improve Outcome: Progressing   Problem: Self-Concept: Goal: Ability to maintain and perform role responsibilities to the fullest extent possible will improve Outcome: Progressing   Problem: Skin Integrity: Goal: Demonstration of wound healing without infection will improve Outcome: Progressing

## 2019-01-09 NOTE — TOC Progression Note (Signed)
Transition of Care [facility]) - Progression Note    Patient Details  Name: Kristen Berry MRN: [id number] Date of Birth: 06-Apr-1956  Transition of Care [facility]) CM/SW [place], [place] Phone Number: 01/09/2019, 10:57 AM  Clinical Narrative:    Pt has received insurance approval, facility will take pt back tomorrow when she is off isolation.  Pt will be able to d/c back tomorrow. Auth ID #[id number], review date is 01/15/2019. Marzetta Board is the Care Coordinator [phone]. All information provided to Lorrie with [place].    Expected Discharge Plan: [place] Barriers to Discharge: Continued Medical Work up, Ship broker  Expected Discharge Plan and Services Expected Discharge Plan: [place] Choice: [place] arrangements for the past 2 months: Mobile Home  Social Determinants of Health (SDOH) Interventions  Readmission Risk Interventions No flowsheet data found.

## 2019-01-09 NOTE — Progress Notes (Signed)
This RN attempted to call pre-dialysis report to hemodialysis RN, but was unsuccessful. Transporter at bedside to transport pt via bed to dialysis.

## 2019-01-09 NOTE — Progress Notes (Signed)
OT / PT Cancellation Note  Patient Details Name: Kristen Berry MRN: [id number] DOB: December 16, 1955   Cancelled Treatment:    Reason Eval/Treat Not Completed: Patient at procedure or test/ unavailable(HD )  [name] 01/09/2019, 8:59 AM   [name], OTR/L  Acute Rehabilitation Services Pager: [phone] Office: [phone] .

## 2019-01-10 LAB — BASIC METABOLIC PANEL
Anion gap: 12 (ref 5–15)
BUN: 36 mg/dL — ABNORMAL HIGH (ref 8–23)
CO2: 23 mmol/L (ref 22–32)
Calcium: 9 mg/dL (ref 8.9–10.3)
Chloride: 100 mmol/L (ref 98–111)
Creatinine, Ser: 2.88 mg/dL — ABNORMAL HIGH (ref 0.44–1.00)
GFR calc Af Amer: 19 mL/min — ABNORMAL LOW (ref >60)
GFR calc non Af Amer: 17 mL/min — ABNORMAL LOW (ref >60)
Glucose, Bld: 106 mg/dL — ABNORMAL HIGH (ref 70–99)
Potassium: 4 mmol/L (ref 3.5–5.1)
Sodium: 135 mmol/L (ref 135–145)

## 2019-01-10 LAB — CBC
HCT: 34.4 % — ABNORMAL LOW (ref 36.0–46.0)
Hemoglobin: 10.8 g/dL — ABNORMAL LOW (ref 12.0–15.0)
MCH: 32 pg (ref 26.0–34.0)
MCHC: 31.4 g/dL (ref 30.0–36.0)
MCV: 102.1 fL — ABNORMAL HIGH (ref 80.0–100.0)
Platelets: 480 10*3/uL — ABNORMAL HIGH (ref 150–400)
RBC: 3.37 MIL/uL — ABNORMAL LOW (ref 3.87–5.11)
RDW: 16.6 % — ABNORMAL HIGH (ref 11.5–15.5)
WBC: 11.8 10*3/uL — ABNORMAL HIGH (ref 4.0–10.5)
nRBC: 0.3 % — ABNORMAL HIGH (ref 0.0–0.2)

## 2019-01-10 LAB — GLUCOSE, CAPILLARY: Glucose-Capillary: 115 mg/dL — ABNORMAL HIGH (ref 70–99)

## 2019-01-10 MED ORDER — LIDOCAINE HCL (PF) 1 % IJ SOLN: 5.0000 mL | INTRAMUSCULAR | Status: DC | PRN

## 2019-01-10 MED ORDER — ALTEPLASE 2 MG IJ SOLR: 2.0000 mg | Freq: Once | INTRAMUSCULAR | Status: DC | PRN

## 2019-01-10 MED ORDER — HEPARIN SODIUM (PORCINE) 1000 UNIT/ML IJ SOLN
INTRAMUSCULAR | Status: AC
  Administered 2019-01-10: 1000 [IU] via INTRAVENOUS_CENTRAL
  Filled 2019-01-10: qty 4

## 2019-01-10 MED ORDER — HEPARIN SODIUM (PORCINE) 1000 UNIT/ML DIALYSIS
1000.0000 [IU] | INTRAMUSCULAR | Status: DC | PRN
  Administered 2019-01-10: 1000 [IU] via INTRAVENOUS_CENTRAL
  Filled 2019-01-10: qty 1

## 2019-01-10 MED ORDER — LIDOCAINE-PRILOCAINE 2.5-2.5 % EX CREA: 1.0000 "application " | TOPICAL_CREAM | CUTANEOUS | Status: DC | PRN

## 2019-01-10 MED ORDER — SODIUM CHLORIDE 0.9 % IV SOLN: 100.0000 mL | INTRAVENOUS | Status: DC | PRN

## 2019-01-10 MED ORDER — CALCITRIOL 0.5 MCG PO CAPS
ORAL_CAPSULE | ORAL | Status: AC
  Administered 2019-01-10: 0.5 ug via ORAL
  Filled 2019-01-10: qty 1

## 2019-01-10 MED ORDER — PENTAFLUOROPROP-TETRAFLUOROETH EX AERO: 1.0000 "application " | INHALATION_SPRAY | CUTANEOUS | Status: DC | PRN

## 2019-01-10 NOTE — Progress Notes (Addendum)
[facility] Progress Note   Subjective: Seen on HD. Has mild SOB, occasional dry cough. Hoping to return to SNF today.     Objective Vitals:   01/10/19 0725 01/10/19 0730 01/10/19 0800 01/10/19 0830  BP: (!) 164/65 (!) 165/74 (!) 122/56 (!) 156/62  Pulse: 86 84 83 88  Resp:      Temp:      TempSrc:      SpO2:      Weight:      Height:       Physical Exam General: Pleasant older female in NAD Heart: S1,S2 RRR Lungs: CTAB anteriorly slightly decreased in bases. No WOB.  Abdomen: Active BS, S, NT Extremities: R trans tib amp with wound vac/stump protector in place. No LLE edema.  Dialysis Access: RIJ [facility] CDI blood lines connected, L AVF + bruit   Additional Objective Labs: Basic Metabolic Panel: Recent Labs  Lab 01/04/19 0244 01/05/19 0321  01/06/19 0924 01/08/19 0338 01/09/19 0219 01/10/19 0141  NA 136 135   < > 132* 133* 131* 135  K 3.5 3.5   < > 4.2 3.7 4.1 4.0  CL 98 99  --  94* 93* 96* 100  CO2 25 24  --  24 23 22 23   GLUCOSE 98 93   < > 263* 124* 106* 106*  BUN 11 23  --  18 59* 74* 36*  CREATININE 2.49* 3.45*  --  2.48* 3.94* 4.31* 2.88*  CALCIUM 9.1 9.3  --  9.1 9.2 8.9 9.0  PHOS 2.6 3.1  --  3.1  --   --   --    < > = values in this interval not displayed.   Liver Function Tests: Recent Labs  Lab 01/04/19 0244 01/05/19 0321 01/06/19 0924  ALBUMIN 2.5* 2.6* 2.7*   No results for input(s): LIPASE, AMYLASE in the last 168 hours. CBC: Recent Labs  Lab 01/05/19 0321  01/06/19 0924 01/08/19 0338 01/09/19 0219 01/10/19 0141  WBC 13.0*  --  19.9* 15.5* 13.4* 11.8*  NEUTROABS  --   --  17.2*  --   --   --   HGB 9.5*   < > 10.8* 10.1* 10.1* 10.8*  HCT 29.9*   < > 33.6* 32.0* 32.5* 34.4*  MCV 101.4*  --  101.5* 101.6* 101.9* 102.1*  PLT 567*  --  573* 493* 514* 480*   < > = values in this interval not displayed.   Blood Culture    Component Value Date/Time   SDES BLOOD RIGHT FOREARM 01/02/2019 1641   SDES BLOOD RIGHT HAND  01/02/2019 1641   SPECREQUEST  01/02/2019 1641    BOTTLES DRAWN AEROBIC ONLY Blood Culture results may not be optimal due to an inadequate volume of blood received in culture bottles   SPECREQUEST  01/02/2019 1641    BOTTLES DRAWN AEROBIC ONLY Blood Culture adequate volume   CULT  01/02/2019 1641    NO GROWTH 5 DAYS Performed at [facility], [place] [address], [place], [place] [address]    CULT  01/02/2019 1641    NO GROWTH 5 DAYS Performed at [facility], [place] [address], [place], [place] [address]    REPTSTATUS 01/07/2019 FINAL 01/02/2019 1641   REPTSTATUS 01/07/2019 FINAL 01/02/2019 1641    Cardiac Enzymes: No results for input(s): CKTOTAL, CKMB, CKMBINDEX, TROPONINI in the last 168 hours. CBG: Recent Labs  Lab 01/08/19 2058 01/09/19 0750 01/09/19 1303 01/09/19 1636 01/09/19 2106  GLUCAP 145*  105* 102* 116* 108*   Iron Studies: No results for input(s): IRON, TIBC, TRANSFERRIN, FERRITIN in the last 72 hours. @lablastinr3 @ Studies/Results: No results found. Medications: . sodium chloride    . sodium chloride     . apixaban  2.5 mg Oral BID  . calcitRIOL  0.5 mcg Oral Q M,W,F-HD  . Chlorhexidine Gluconate Cloth  6 each Topical Q0600  . docusate sodium  100 mg Oral BID  . feeding supplement (PRO-STAT SUGAR FREE 64)  30 mL Oral BID  . insulin aspart  0-9 Units Subcutaneous TID WC  . lidocaine  1 patch Transdermal Q24H  . multivitamin  1 tablet Oral QHS  . 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 Sharol Given.Rec'dvanc and cefepime. Now off ABX. BC NGTD X 5 days. Per primary.WBC19.94/11-WBC 13.4. Afebrile overnight. 2. Hypokalemia:K+4.1. Continue 3.0 K bath as OP. 3. ESRD -MWF. No heparin.Has been done as separate D/T BB infestation/COVID exposures but having HD in  unit today. HD off schedule 01/09/19 D/T staffing. Short HD today to get back on MWF schedule. No heparin.K+ 4.0 4. Anemia -HGB10.1Rec'dAranesp 100 mcg IV 01/03/2019. Follow HGB. 5. Secondary hyperparathyroidism - Phos 3.1. Held binders. Decreased VDRA to 0.5 mcg PO due to ^ corr Ca. Follow labs. 6.HTN/volume -Tentatively set new EDW 78 kg. HD 04/15 Net UF 1.6 liters powt wt 79.9 kg C/O mild SOB today. Will attempt UFG 2.0 liters. Make post wt EDW.  7. Nutrition -albumin 2.5.renal diet added prostatenal vits.  8. Known COVID 19 exposure during last hospitalization-Tested negative 12/27/2018. No symptoms. On droplet precautions. Has been on 14 day isolation which ends 01/09/19. 9.Active bed bug infestation. Had brief exposure after last d/c from SNF. To be d/c to [facility]. 10. H/O chronic systolic HF EF 49-82%. Volume is stable. 11. H/O CAD-ASA,BB, Eliquis-no chest pain. 12.Poorly developing L AVF-had fistulogram on 4/1 Dr Trula Slade planning fistula elevation and branch ligation procedure in the future - will need to have DAPT stopped 48 hours in advance of future ortho or vascular procedures  13. MGUS-Has been seen by Dr. Alvy Bimler.  29. H/O CVA  Disposition: DC to SNF today.   Rita H. Brown NP-C 01/10/2019, 9:12 AM  [facility] [phone]  Pt seen, examined and agree w A/P as above.  [facility] 01/10/2019, 12:57 PM

## 2019-01-10 NOTE — Progress Notes (Signed)
Physical Therapy Treatment Patient Details Name: Kristen Berry MRN: [id number] DOB: 1956-03-29 Today's Date: 01/10/2019    History of Present Illness Pt is a 63 y/o female s/p Right BKA 01/05/19. PMH includes ESRD on HD, provoked bilateral DVT after surgery, type 2 diabetes, MGUS, and  HFpEF.  Pt was exposed to COVID (+) pt on her last admission and is on quarietene through 01/09/19.  She has thus far tested (-).     PT Comments    Pt supine in bed awaiting transfer to SNF.  Pt assisted into standing for transfer training with sara stedy sit to stand.  She performed multiple standing trials and transfers to and from commode.  Pt able to perform pericare in standing and wash hands.  Plan for SNF placement remains appropriate.     Follow Up Recommendations  SNF     Equipment Recommendations  Rolling walker with 5" wheels;3in1 (PT);Other (comment);Wheelchair cushion (measurements PT);Wheelchair (measurements PT)(drop arm 3:1 WC with amputee pad.  )    Recommendations for Other Services       Precautions / Restrictions Precautions Precautions: Fall;Other (comment) Precaution Comments: contact; droplet - exposure to COVID-19, but tested negative, on droplet/quarientiened through 01/09/19.; wound vac Required Braces or Orthoses: Other Brace Other Brace: limb guard right leg Restrictions Weight Bearing Restrictions: Yes RLE Weight Bearing: Non weight bearing    Mobility  Bed Mobility Overal bed mobility: Needs Assistance Bed Mobility: Supine to Sit     Supine to sit: Supervision     General bed mobility comments: Supervision for safety, reliant on bed rail to roll and come to sitting EOB.   Transfers Overall transfer level: Needs assistance Equipment used: Ambulation equipment used(sara stedy utilized)   Sit to Stand: Mod assist;From elevated surface;+2 physical assistance(initially +2 from bed as patient was anxious but progressed to +1 as tx progressed from standard seat  height)         General transfer comment: Pt required cues for hand placement, forward weight shifting and pushing through LLE.    Ambulation/Gait                 Stairs             Wheelchair Mobility    Modified Rankin (Stroke Patients Only)       Balance Overall balance assessment: Needs assistance   Sitting balance-Leahy Scale: Good       Standing balance-Leahy Scale: Poor                              Cognition Arousal/Alertness: Awake/alert Behavior During Therapy: WFL for tasks assessed/performed Overall Cognitive Status: Within Functional Limits for tasks assessed                                        Exercises      General Comments        Pertinent Vitals/Pain Pain Assessment: Faces Faces Pain Scale: Hurts even more Pain Location: R LE, reporting phantom pain/sensations Pain Descriptors / Indicators: Grimacing;Guarding Pain Intervention(s): Monitored during session;Repositioned    Home Living                      Prior Function            PT Goals (current goals can now be found in the care plan  section) Acute Rehab PT Goals Patient Stated Goal: to gain confidence, get a prosthesis and get on with her life. Potential to Achieve Goals: Good Additional Goals Additional Goal #1: Pt will manage wheeclchair parts, and propulsion including turns with Supervision and min cues Progress towards PT goals: Progressing toward goals    Frequency    Min 2X/week      PT Plan Current plan remains appropriate    Co-evaluation              AM-PAC PT "6 Clicks" Mobility   Outcome Measure  Help needed turning from your back to your side while in a flat bed without using bedrails?: A Little Help needed moving from lying on your back to sitting on the side of a flat bed without using bedrails?: A Little Help needed moving to and from a bed to a chair (including a wheelchair)?: A Lot Help  needed standing up from a chair using your arms (e.g., wheelchair or bedside chair)?: A Lot Help needed to walk in hospital room?: Total Help needed climbing 3-5 steps with a railing? : Total 6 Click Score: 12    End of Session Equipment Utilized During Treatment: Gait belt Activity Tolerance: Patient tolerated treatment well Patient left: in bed(seated edge of bed, informed nursing.  ) Nurse Communication: Mobility status PT Visit Diagnosis: Difficulty in walking, not elsewhere classified (R26.2);Pain;Muscle weakness (generalized) (M62.81) Pain - Right/Left: Right Pain - part of body: Leg     Time: 1453-1516 PT Time Calculation (min) (ACUTE ONLY): 23 min  Charges:  $Therapeutic Activity: 23-37 mins                     [name], PTA Acute Rehabilitation Services Pager [phone] Office [phone]     Mauriana Dann [name] 01/10/2019, 5:29 PM

## 2019-01-10 NOTE — Progress Notes (Signed)
Dialysis Coordinator notified outpatient HD clinic of patient's pending discharge today for continuity of care.  Alphonzo Cruise Dialysis Coordinator [phone]

## 2019-01-10 NOTE — Progress Notes (Signed)
Gave report to [place] at [facility] in [place].

## 2019-01-10 NOTE — Progress Notes (Signed)
Family Medicine Teaching Service Daily Progress Note      FPTS Intern pager: [phone], text pages welcome  Patient name: Kristen Berry Medical record number: [id number] Date of birth: [birth year] Age: 63 y.o. Gender: female  Primary Care Provider: [place], Sarah T, MD Consultants: Manson Passey, vascular surgery, PT/OT Code Status: DNR  Pt Overview and Major Events to Date:  4/7 admitted to fpts 4/10- R BKA  4/15 medically ready for SNF, had neg COVID test 4/1 and completed 14d monitoring after possible exposure.   Assessment and Plan: Kristen Berry is a 63 y.o. female presented with worsening discoloration of right foot and cellulitis after recent amutation of R toe. 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 Wound vac w/ praveena pump. At home on oxy IR 5 mg q12 hr prn -continue non weight bearing -follow up w/ ortho outpatient -tylenol as needed for pain -PT/OT recommending SNF  ESRD on HDMWF -nephro following for HD  CoViD-19 exposure, resolved Exposed to + hospital personnel during last admission. Per ID pt observed under quarantine for x2 weeks -dc'd droplet precautions -medically ready for SNF 4/15 as has remained asymptomatic   Bed Bugs- noted on admission -Contact precautions  Diabetes mellitus type 2, stable. a1c 5.5 on 12/22/2018. - monitor CBGs  MGUS. Stable. [facility] - follow up outpt  HFrEF. Stable. Echo January 2018 EF 35 to 40%. Septal and apical akinesis, moderately dilated LV, mild LVH. -monitor I/Os, daily weights  CAD. stable -continue brilinta, ASA   Mild asthma, chronic. stable - prn albuterol   HTN. BPs normotensive. Patient takes amlodipine 5 mg, Coreg 25mg  BID, hydralazine 25 mg BID at home.  Currently BP meds on hold.  - monitor BPs  HLD- pt takes 20mg  lipitor at  home. -continue home medication  History of left leg DVT patient takes Eliquis 2.5 mg BID for hx of DVT in left leg.  -continue eliquis  Hx of stroke -No residual deficits, stable. -continue brilinta  FEN/GI: renal diet PPx: on Eliquis, Aspirin and brilinta  Disposition: medically ready for SNF today pending bed availability  Subjective:  Feels well today. No complaints. Ready to go to SNF.   Objective: Temp:  [97.3 F (36.3 C)-99 F (37.2 C)] 98.2 F (36.8 C) (04/15 0614) Pulse Rate:  [88-125] 88 (04/15 0614) Resp:  [13-18] 16 (04/15 0614) BP: (110-143)/(45-88) 120/88 (04/15 0614) SpO2:  [97 %-100 %] 100 % (04/15 0614) Weight:  [79.9 kg-81.5 kg] 79.9 kg (04/14 1224)   Physical Exam: Gen: laying in bed in HD unit, in NAD Heart: RRR Lungs: CTAB, NWOB on RA Abdomen: soft, nontender, + bowel sounds Extremities: R LE with wound vacn and stump protector Neuro: alert and awake, grossly normal   Laboratory: Recent Labs  Lab 01/08/19 0338 01/09/19 0219 01/10/19 0141  WBC 15.5* 13.4* 11.8*  HGB 10.1* 10.1* 10.8*  HCT 32.0* 32.5* 34.4*  PLT 493* 514* 480*   Recent Labs  Lab 01/08/19 0338 01/09/19 0219 01/10/19 0141  NA 133* 131* 135  K 3.7 4.1 4.0  CL 93* 96* 100  CO2 23 22 23   BUN 59* 74* 36*  CREATININE 3.94* 4.31* 2.88*  CALCIUM 9.2 8.9 9.0  GLUCOSE 124* 106* 106*   Imaging/Diagnostic Tests: EKG 12-LEAD (01/05/2019) Sinus rhythm 70 bpm, QTc 460, 1st degree block, no ST changes or  t-wave abnormailities  RIGHT FOOT COMPLETE - 3+ VIEW (01/02/2019) IMPRESSION: 1. No evidence of osteomyelitis. 2. Edema and small amount of gas in the soft tissues at the site of the previous resection of the first metatarsal and great toe. This could be residual postsurgical edema and air. Infection is not excluded.   Bufford Lope, DO  01/10/2019, 7:38 AM PGY-3, [place] Intern pager: [phone], text pages welcome

## 2019-01-10 NOTE — Progress Notes (Signed)
Kristen Berry to be D/C'd Skilled nursing facility per MD order.  Discussed prescriptions and follow up appointments with the patient. Prescriptions given to patient, medication list explained in detail. Pt verbalized understanding.  Allergies as of 01/10/2019      Reactions   Crestor [rosuvastatin Calcium] Other (See Comments)   Leg pain      Medication List    STOP taking these medications   heparin 1000 unit/mL Soln injection   oxyCODONE 5 MG immediate release tablet Commonly known as:  Oxy IR/ROXICODONE   Vancomycin 750-5 MG/150ML-% Soln Commonly known as:  VANCOCIN     TAKE these medications   acetaminophen 500 MG tablet Commonly known as:  TYLENOL Take 500 mg by mouth every 8 (eight) hours as needed for mild pain or headache.   alteplase 2 MG injection Commonly known as:  CATHFLO ACTIVASE 2 mg by Intracatheter route once as needed for open catheter.   amLODipine 5 MG tablet Commonly known as:  NORVASC Take 5 mg by mouth every evening.   apixaban 2.5 MG Tabs tablet Commonly known as:  ELIQUIS Take 1 tablet (2.5 mg total) by mouth 2 (two) times daily.   aspirin 81 MG EC tablet Take 1 tablet (81 mg total) by mouth daily.   atorvastatin 20 MG tablet Commonly known as:  LIPITOR Take 1 tablet (20 mg total) by mouth daily at 6 PM.   calcitRIOL 0.25 MCG capsule Commonly known as:  ROCALTROL Take 3 capsules (0.75 mcg total) by mouth Every Tuesday,Thursday,and Saturday with dialysis.   calcium acetate 667 MG capsule Commonly known as:  PHOSLO Take 667 mg by mouth 2 (two) times daily with a meal.   carvedilol 25 MG tablet Commonly known as:  COREG Take 25 mg by mouth 2 (two) times daily.   clotrimazole-betamethasone cream Commonly known as:  LOTRISONE Apply 1 application topically daily as needed (rash).   diphenoxylate-atropine 2.5-0.025 MG tablet Commonly known as:  LOMOTIL Take 1 tablet by mouth every 12 (twelve) hours as needed for diarrhea or loose  stools.   gabapentin 100 MG capsule Commonly known as:  NEURONTIN Take 1 capsule (100 mg total) by mouth at bedtime.   hydrALAZINE 25 MG tablet Commonly known as:  APRESOLINE Take 25 mg by mouth 2 (two) times daily.   lidocaine-prilocaine cream Commonly known as:  EMLA Apply 1 application topically daily as needed (dialysis).   multivitamin Tabs tablet Take 1 tablet by mouth at bedtime.   pantoprazole 40 MG tablet Commonly known as:  PROTONIX Take 40 mg by mouth daily.   pentafluoroprop-tetrafluoroeth Aero Commonly known as:  GEBAUERS Apply 1 application topically as needed (topical anesthesia for hemodialysis).   pentoxifylline 400 MG CR tablet Commonly known as:  TRENTAL Take 1 tablet (400 mg total) by mouth daily with supper.   polyethylene glycol 17 g packet Commonly known as:  MIRALAX / GLYCOLAX Take 17 g by mouth daily as needed for mild constipation.   ProAir HFA 108 (90 Base) MCG/ACT inhaler Generic drug:  albuterol Inhale 2 puffs into the lungs every 6 (six) hours as needed for wheezing or shortness of breath.   saccharomyces boulardii 250 MG capsule Commonly known as:  FLORASTOR Take 1 capsule (250 mg total) by mouth 2 (two) times daily.   ticagrelor 90 MG Tabs tablet Commonly known as:  BRILINTA Take 90 mg by mouth 2 (two) times daily.       Vitals:   01/10/19 1000 01/10/19 1026  BP: [name])  119/96 (!) 145/41  Pulse: (!) 114 (!) 111  Resp:  18  Temp:  97.6 F (36.4 C)  SpO2:  98%    Skin clean, dry and intact without evidence of skin break down, no evidence of skin tears noted. IV catheter discontinued intact. Site without signs and symptoms of complications. Dressing and pressure applied, Stump protector on. Prevena wound vac working, clean, dry, and intact. Pt denies pain at this time. Right HD cath, clean, dry, and intact. Patient had HD today ran 3 hours, 2L removed, VS stable, post HD weight 77.5kg. LUA  Fistula positive bruit and thrill. Normal  dialysis is MWF.  No complaints noted.  An After Visit Summary was printed and given to [place] Patient escorted via stretcher, and D/C SNF via [place]

## 2019-01-12 DIAGNOSIS — S88119A Complete traumatic amputation at level between knee and ankle, unspecified lower leg, initial encounter: Secondary | ICD-10-CM | POA: Insufficient documentation

## 2019-01-15 ENCOUNTER — Other Ambulatory Visit: Payer: Self-pay

## 2019-01-15 ENCOUNTER — Encounter (INDEPENDENT_AMBULATORY_CARE_PROVIDER_SITE_OTHER): Payer: Self-pay | Admitting: Orthopedic Surgery

## 2019-01-15 ENCOUNTER — Ambulatory Visit (INDEPENDENT_AMBULATORY_CARE_PROVIDER_SITE_OTHER): Payer: Medicare HMO | Admitting: Physician Assistant

## 2019-01-15 VITALS — Ht 68.0 in | Wt 170.9 lb

## 2019-01-15 DIAGNOSIS — Z992 Dependence on renal dialysis: Secondary | ICD-10-CM

## 2019-01-15 DIAGNOSIS — Z89511 Acquired absence of right leg below knee: Secondary | ICD-10-CM

## 2019-01-15 DIAGNOSIS — E1142 Type 2 diabetes mellitus with diabetic polyneuropathy: Secondary | ICD-10-CM

## 2019-01-15 DIAGNOSIS — N186 End stage renal disease: Secondary | ICD-10-CM

## 2019-01-15 DIAGNOSIS — I739 Peripheral vascular disease, unspecified: Secondary | ICD-10-CM

## 2019-01-15 DIAGNOSIS — Z7901 Long term (current) use of anticoagulants: Secondary | ICD-10-CM

## 2019-01-15 NOTE — Progress Notes (Signed)
Office Visit Note   Patient: Kristen Berry           Date of Birth: 08/24/1956           MRN: [id number] Visit Date: 01/15/2019              Requested by: Martinique, Sarah T, MD [place], [place] [address] PCP: Martinique, Sarah T, MD  Chief Complaint  Patient presents with  . 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  She underwent surgery on 01/05/2019.  She had the [facility] on postoperatively and this was removed yesterday at the skilled nursing facility, [facility] nursing center. Since the removal of a Praveena VAC yesterday as she has had ongoing bleeding and presents for evaluation today.  She reports mild pain over the area.  She has been wearing her stump shrinker stocking until the [facility] was removed and they were applying dry dressings due to continued bleeding.  She has continued on her usual Eliquis and Trental postoperatively.  Assessment & Plan: Visit Diagnoses:  1. Acquired absence of right lower extremity below knee ([place])   2. Type 2 diabetes mellitus with diabetic polyneuropathy, without long-term current use of insulin ([place])   3. Chronic anticoagulation   4. PAD (peripheral artery disease) ([place])   5. ESRD on hemodialysis ([place])     Plan: She had some bleeding localized to the skin edges over the incision which was treated with silver nitrate and the bleeding was resolved.  She has no evidence for retained hematoma currently.  A drying ABD dressing, Ace wrap and then the stump shrinker stocking were applied and they can continue to do this twice daily at the nursing facility.  We have sent orders for stopping the Eliquis and Trental as well.  She will follow-up here in 1 week.  Follow-Up Instructions: Return in about 1 week (around 01/22/2019).   Ortho Exam  Patient is alert, oriented, no  adenopathy, well-dressed, normal affect, normal respiratory effort. The staples are intact over the right transtibial amputation site.  There is bloody oozing from several areas which are actively bleeding over the skin over the central incision and these were treated with silver nitrate and the bleeding resolved.  There is no sign of cellulitis.  She has good knee extension.  Imaging: No results found.   Labs: Lab Results  Component Value Date   HGBA1C 5.5 12/22/2018   HGBA1C 5.5 10/13/2017   HGBA1C 6.6 (H) 06/28/2013   ESRSEDRATE 100 (H) 12/21/2018   ESRSEDRATE 127 (H) 10/27/2017   CRP 18.8 (H) 10/27/2017   REPTSTATUS 01/07/2019 FINAL 01/02/2019   REPTSTATUS 01/07/2019 FINAL 01/02/2019   CULT  01/02/2019    NO GROWTH 5 DAYS Performed at [facility], [place] [address], [place], [place] [address]    CULT  01/02/2019    NO GROWTH 5 DAYS Performed at [place] [address], [place], [place] [address]      Lab Results  Component Value Date   ALBUMIN 2.7 (L) 01/06/2019   ALBUMIN 2.6 (L) 01/05/2019   ALBUMIN 2.5 (L) 01/04/2019    Body mass index is 25.98 kg/m.  Orders:  No orders of the defined types were placed in this encounter.  No orders of the defined types were placed in this  encounter.    Procedures: No procedures performed  Clinical Data: No additional findings.  ROS:  All other systems negative, except as noted in the HPI. Review of Systems  Objective: Vital Signs: Ht 5\' 8"  (1.727 m)   Wt 170 lb 13.8 oz (77.5 kg)   BMI 25.98 kg/m   Specialty Comments:  No specialty comments available.  PMFS History: Patient Active Problem List   Diagnosis Date Noted  . Below knee amputation ([place])   . S/P BKA (below knee amputation), right ([place]) 01/05/2019  . ESRD on hemodialysis ([place])   . Coronary artery disease without angina pectoris   . Cellulitis 01/02/2019  . Gangrene of right foot ([place])   . Right foot infection 12/21/2018  . PAD  (peripheral artery disease) ([place]) 01/24/2018  . Peripheral artery disease ([place]) 01/24/2018  . C. difficile diarrhea 01/06/2018  . Hypokalemia 01/06/2018  . ESRD (end stage renal disease) ([place]) 01/06/2018  . Prolonged QT interval 01/06/2018  . Hypotension 01/06/2018  . Multiple rib fractures 01/06/2018  . Pressure injury of skin 11/08/2017  . Coronary artery disease due to lipid rich plaque   . Chest pain   . Acute on chronic systolic CHF (congestive heart failure), NYHA class 3 ([place]) 10/18/2017  . PICC (peripherally inserted central catheter) in place   . Acute osteomyelitis of toe of left foot ([place])   . ARF (acute renal failure) ([place]) 10/10/2017  . AKI (acute kidney injury) ([place]) 10/10/2017  . Essential hypertension 12/14/2016  . Abnormal bone xray 06/15/2016  . MGUS (monoclonal gammopathy of unknown significance) 05/18/2016  . Stage 4 chronic kidney disease ([place]) 05/18/2016  . CHF (congestive heart failure) ([place]) 05/18/2016  . Renal insufficiency 06/28/2013  . Other and unspecified hyperlipidemia 06/28/2013  . Obesity, unspecified 06/28/2013  . Pain in limb 06/28/2013  . Diabetes mellitus type 2, insulin dependent ([place]) 06/28/2013   Past Medical History:  Diagnosis Date  . Anemia   . Arthritis    "hands, knees" (10/10/2017)  . Asthma   . CHF (congestive heart failure) ([place])   . Chronic kidney disease (CKD), stage IV (severe) ([place])    Dialysis T/ Th/ Sat  . Coronary artery disease   . H/O Clostridium difficile infection   . High cholesterol   . History of kidney stones   . Hypertension   . PVD (peripheral vascular disease) ([place])    "LLE; will have OR" (10/10/2017)  . Sleep apnea    "never given mask" (10/10/2017)  . Stroke [facility])    mild stroke mid April - 2019  . Type II diabetes mellitus ([place])    "no RX anymore" (10/10/2017)    Family History  Problem Relation Age of Onset  . Hypertension Father   . Heart failure Maternal Grandmother   . Heart failure Maternal  Grandfather     Past Surgical History:  Procedure Laterality Date  . A/V FISTULAGRAM N/A 12/27/2018   Procedure: A/V FISTULAGRAM - Left Upper;  Surgeon: Serafina Mitchell, MD;  Location: [facility];  Service: Cardiovascular;  Laterality: N/A;  . AMPUTATION Left 11/10/2017   Procedure: AMPUTATION DIGIT SECOND TOE LEFT FOOT;  Surgeon: Waynetta Sandy, MD;  Location: [facility];  Service: Vascular;  Laterality: Left;  . AMPUTATION Left 01/24/2018   Procedure: AMPUTATION THIRD TOE LEFT FOOT;  Surgeon: Waynetta Sandy, MD;  Location: [facility];  Service: Vascular;  Laterality: Left;  . AMPUTATION Right 12/22/2018   Procedure: Right foot 1st ray amputation;  Surgeon:  Newt Minion, MD;  Location: [facility];  Service: Orthopedics;  Laterality: Right;  . AMPUTATION Right 01/05/2019   Procedure: RIGHT BELOW KNEE AMPUTATION;  Surgeon: Newt Minion, MD;  Location: [facility];  Service: Orthopedics;  Laterality: Right;  . AORTOGRAM N/A 11/03/2017   Procedure: Ultrasound Guided Cannulation Right Common Femoral Artery;  Aortagram with Left Lower Extremity Arteriogram; Attempted Treatment Left Superficial Femoral Artery; Percutaneous Closure Right Common Femoral Arteriotomy with Proglide Device;  Surgeon: Waynetta Sandy, MD;  Location: [facility];  Service: Vascular;  Laterality: N/A;  . APPLICATION OF WOUND VAC Right 01/05/2019   Procedure: Application Of Wound Vac;  Surgeon: Newt Minion, MD;  Location: [facility];  Service: Orthopedics;  Laterality: Right;  . AV FISTULA PLACEMENT Left 11/10/2017   Procedure: ARTERIOVENOUS (AV) FISTULA CREATION LEFT UPPER ARM;  Surgeon: Waynetta Sandy, MD;  Location: [facility];  Service: Vascular;  Laterality: Left;  . HERNIA REPAIR  1950s  . INSERTION OF DIALYSIS CATHETER Right 11/10/2017   Procedure: INSERTION OF TUNNELED DIALYSIS CATHETER RIGHT INTERNAL JUGULAR PLACEMENT;  Surgeon: Waynetta Sandy, MD;  Location: [facility];  Service: Vascular;   Laterality: Right;  . IR FLUORO GUIDE CV LINE RIGHT  10/19/2017  . IR US GUIDE VASC ACCESS RIGHT  10/19/2017  . LEFT HEART CATH AND CORONARY ANGIOGRAPHY N/A 11/01/2017   Procedure: LEFT HEART CATH AND CORONARY ANGIOGRAPHY;  Surgeon: Belva Crome, MD;  Location: [facility];  Service: Cardiovascular;  Laterality: N/A;  . LOWER EXTREMITY ANGIOGRAPHY N/A 12/27/2018   Procedure: LOWER EXTREMITY ANGIOGRAPHY - Right Lower;  Surgeon: Serafina Mitchell, MD;  Location: [facility];  Service: Cardiovascular;  Laterality: N/A;  . PERIPHERAL VASCULAR INTERVENTION Left 11/07/2017   Procedure: PERIPHERAL VASCULAR INTERVENTION;  Surgeon: Waynetta Sandy, MD;  Location: [facility];  Service: Cardiovascular;  Laterality: Left;  left SFA  . PERIPHERAL VASCULAR INTERVENTION Right 12/27/2018   Procedure: PERIPHERAL VASCULAR INTERVENTION;  Surgeon: Serafina Mitchell, MD;  Location: [facility];  Service: Cardiovascular;  Laterality: Right;  SFA  . SHOULDER OPEN ROTATOR CUFF REPAIR Left   . TUBAL LIGATION    . ULTRASOUND GUIDANCE FOR VASCULAR ACCESS  11/01/2017   Procedure: Ultrasound Guidance For Vascular Access;  Surgeon: Belva Crome, MD;  Location: [facility];  Service: Cardiovascular;;   Social History   Occupational History  . Occupation: disabled  Tobacco Use  . Smoking status: Never Smoker  . Smokeless tobacco: Never Used  Substance and Sexual Activity  . Alcohol use: No  . Drug use: No  . Sexual activity: Never

## 2019-01-16 ENCOUNTER — Inpatient Hospital Stay (INDEPENDENT_AMBULATORY_CARE_PROVIDER_SITE_OTHER): Payer: Medicare HMO | Admitting: Orthopedic Surgery

## 2019-01-18 ENCOUNTER — Telehealth: Payer: Self-pay | Admitting: *Deleted

## 2019-01-18 ENCOUNTER — Inpatient Hospital Stay (INDEPENDENT_AMBULATORY_CARE_PROVIDER_SITE_OTHER): Payer: Medicare HMO | Admitting: Orthopedic Surgery

## 2019-01-18 NOTE — Telephone Encounter (Signed)
Left detailed telephone voice message with Kristen Berry at [facility] where Kristen Berry is recovering from R BKA done on 01-05-2019 by Dr. Sharol Given.  Informed Kristen Berry that Kristen Berry's 3 week follow -up appointment should be scheduled with Dr. Sharol Given as he performed the BKA.  Informed Kristen Berry that Gomez Cleverly RN would be calling her to schedule 1. superficialization left brachiocephalic AVF branch ligation 2. Left lower extremity angiogram with possible intervention by Dr. Trula Slade . Asked Kristen Berry to call Gomez Cleverly RN at [phone] if she had questions.

## 2019-01-22 ENCOUNTER — Telehealth: Payer: Self-pay | Admitting: *Deleted

## 2019-01-22 ENCOUNTER — Telehealth: Payer: Self-pay

## 2019-01-22 NOTE — Telephone Encounter (Signed)
Hoyle Sauer at [facility] in [place] called to get updated information on when Kristen Berry would be having her procedure done with Dr Trula Slade. Looking at the chart we are planning to do a superficialization of the Left AVF - LE Angiogram  Spoke with Kristen Berry as it appears she has been working with the logistics of the procedure being scheduled and she said that we are just waiting on Dr Trula Slade to let us know when he wants to proceed. She recently had an amputation done with Dr Sharol Given and we are trying to give her time to recover from that before doing another procedure. Advised her that we would call when he advises that we can move forward.   York Cerise, CMA

## 2019-01-22 NOTE — Telephone Encounter (Signed)
Left message for [facility] nurse to call this office back to discuss pending procedure with [facility] surgeon.

## 2019-01-23 ENCOUNTER — Encounter (INDEPENDENT_AMBULATORY_CARE_PROVIDER_SITE_OTHER): Payer: Self-pay | Admitting: Orthopedic Surgery

## 2019-01-23 ENCOUNTER — Other Ambulatory Visit: Payer: Self-pay

## 2019-01-23 ENCOUNTER — Ambulatory Visit (INDEPENDENT_AMBULATORY_CARE_PROVIDER_SITE_OTHER): Payer: Medicare HMO | Admitting: Orthopedic Surgery

## 2019-01-23 VITALS — Ht 68.0 in | Wt 170.0 lb

## 2019-01-23 DIAGNOSIS — Z89511 Acquired absence of right leg below knee: Secondary | ICD-10-CM

## 2019-01-23 NOTE — Progress Notes (Signed)
Office Visit Note   Patient: Kristen Berry           Date of Birth: 09/22/1956           MRN: [id number] Visit Date: 01/23/2019              Requested by: Martinique, Sarah T, MD [place], [place] [address] PCP: Martinique, Sarah T, MD  Chief Complaint  Patient presents with  . Right Leg - Routine Post Op    01/05/19 right BKA       HPI: Patient is a 63 year old woman who presents 2 weeks status post right transtibial amputation she has been wearing the stump shrinker the limb guard she denies any pain or drainage.  She is working with Museum/gallery curator for prosthetic fitting.  Assessment & Plan: Visit Diagnoses:  1. Acquired absence of right lower extremity below knee [facility])     Plan: We will harvest the staples today continue with the stump shrinker  Follow-Up Instructions: No follow-ups on file.   Ortho Exam  Patient is alert, oriented, no adenopathy, well-dressed, normal affect, normal respiratory effort. Examination the limb is well consolidated there is no redness no swelling no cellulitis no wound dehiscence no signs of infection.  She has full extension of her knee.  Imaging: No results found. No images are attached to the encounter.  Labs: Lab Results  Component Value Date   HGBA1C 5.5 12/22/2018   HGBA1C 5.5 10/13/2017   HGBA1C 6.6 (H) 06/28/2013   ESRSEDRATE 100 (H) 12/21/2018   ESRSEDRATE 127 (H) 10/27/2017   CRP 18.8 (H) 10/27/2017   REPTSTATUS 01/07/2019 FINAL 01/02/2019   REPTSTATUS 01/07/2019 FINAL 01/02/2019   CULT  01/02/2019    NO GROWTH 5 DAYS Performed at [facility], [place] [address] [place], [place] [address]    CULT  01/02/2019    NO GROWTH 5 DAYS Performed at [place] [address], [place], [place] [address]      Lab Results  Component Value Date   ALBUMIN 2.7 (L) 01/06/2019   ALBUMIN 2.6 (L) 01/05/2019   ALBUMIN 2.5 (L) 01/04/2019    Body mass index is 25.85 kg/m.  Orders:  No orders of the defined  types were placed in this encounter.  No orders of the defined types were placed in this encounter.    Procedures: No procedures performed  Clinical Data: No additional findings.  ROS:  All other systems negative, except as noted in the HPI. Review of Systems  Objective: Vital Signs: Ht 5\' 8"  (1.727 m)   Wt 170 lb (77.1 kg)   BMI 25.85 kg/m   Specialty Comments:  No specialty comments available.  PMFS History: Patient Active Problem List   Diagnosis Date Noted  . Below knee amputation ([place])   . S/P BKA (below knee amputation), right ([place]) 01/05/2019  . ESRD on hemodialysis ([place])   . Coronary artery disease without angina pectoris   . Cellulitis 01/02/2019  . Gangrene of right foot ([place])   . Right foot infection 12/21/2018  . PAD (peripheral artery disease) ([place]) 01/24/2018  . Peripheral artery disease ([place]) 01/24/2018  . C. difficile diarrhea 01/06/2018  . Hypokalemia 01/06/2018  . ESRD (end stage renal disease) ([place]) 01/06/2018  . Prolonged QT interval 01/06/2018  . Hypotension 01/06/2018  . Multiple rib fractures 01/06/2018  . Pressure injury of skin 11/08/2017  . Coronary artery disease due to lipid rich plaque   . Chest pain   .  Acute on chronic systolic CHF (congestive heart failure), NYHA class 3 ([place]) 10/18/2017  . PICC (peripherally inserted central catheter) in place   . Acute osteomyelitis of toe of left foot ([place])   . ARF (acute renal failure) ([place]) 10/10/2017  . AKI (acute kidney injury) ([place]) 10/10/2017  . Essential hypertension 12/14/2016  . Abnormal bone xray 06/15/2016  . MGUS (monoclonal gammopathy of unknown significance) 05/18/2016  . Stage 4 chronic kidney disease ([place]) 05/18/2016  . CHF (congestive heart failure) ([place]) 05/18/2016  . Renal insufficiency 06/28/2013  . Other and unspecified hyperlipidemia 06/28/2013  . Obesity, unspecified 06/28/2013  . Pain in limb 06/28/2013  . Diabetes mellitus type 2, insulin dependent ([place]) 06/28/2013    Past Medical History:  Diagnosis Date  . Anemia   . Arthritis    "hands, knees" (10/10/2017)  . Asthma   . CHF (congestive heart failure) ([place])   . Chronic kidney disease (CKD), stage IV (severe) ([place])    Dialysis T/ Th/ Sat  . Coronary artery disease   . H/O Clostridium difficile infection   . High cholesterol   . History of kidney stones   . Hypertension   . PVD (peripheral vascular disease) ([place])    "LLE; will have OR" (10/10/2017)  . Sleep apnea    "never given mask" (10/10/2017)  . Stroke [facility])    mild stroke mid April - 2019  . Type II diabetes mellitus ([place])    "no RX anymore" (10/10/2017)    Family History  Problem Relation Age of Onset  . Hypertension Father   . Heart failure Maternal Grandmother   . Heart failure Maternal Grandfather     Past Surgical History:  Procedure Laterality Date  . A/V FISTULAGRAM N/A 12/27/2018   Procedure: A/V FISTULAGRAM - Left Upper;  Surgeon: Serafina Mitchell, MD;  Location: [facility];  Service: Cardiovascular;  Laterality: N/A;  . AMPUTATION Left 11/10/2017   Procedure: AMPUTATION DIGIT SECOND TOE LEFT FOOT;  Surgeon: Waynetta Sandy, MD;  Location: [facility];  Service: Vascular;  Laterality: Left;  . AMPUTATION Left 01/24/2018   Procedure: AMPUTATION THIRD TOE LEFT FOOT;  Surgeon: Waynetta Sandy, MD;  Location: [facility];  Service: Vascular;  Laterality: Left;  . AMPUTATION Right 12/22/2018   Procedure: Right foot 1st ray amputation;  Surgeon: Newt Minion, MD;  Location: [place] [facility];  Service: Orthopedics;  Laterality: Right;  . AMPUTATION Right 01/05/2019   Procedure: RIGHT BELOW KNEE AMPUTATION;  Surgeon: Newt Minion, MD;  Location: [facility];  Service: Orthopedics;  Laterality: Right;  . AORTOGRAM N/A 11/03/2017   Procedure: Ultrasound Guided Cannulation Right Common Femoral Artery;  Aortagram with Left Lower Extremity Arteriogram; Attempted Treatment Left Superficial Femoral Artery; Percutaneous Closure Right Common  Femoral Arteriotomy with Proglide Device;  Surgeon: Waynetta Sandy, MD;  Location: [facility];  Service: Vascular;  Laterality: N/A;  . APPLICATION OF WOUND VAC Right 01/05/2019   Procedure: Application Of Wound Vac;  Surgeon: Newt Minion, MD;  Location: [facility];  Service: Orthopedics;  Laterality: Right;  . AV FISTULA PLACEMENT Left 11/10/2017   Procedure: ARTERIOVENOUS (AV) FISTULA CREATION LEFT UPPER ARM;  Surgeon: Waynetta Sandy, MD;  Location: [facility];  Service: Vascular;  Laterality: Left;  . HERNIA REPAIR  1950s  . INSERTION OF DIALYSIS CATHETER Right 11/10/2017   Procedure: INSERTION OF TUNNELED DIALYSIS CATHETER RIGHT INTERNAL JUGULAR PLACEMENT;  Surgeon: Waynetta Sandy, MD;  Location: [facility];  Service: Vascular;  Laterality:  Right;  . IR FLUORO GUIDE CV LINE RIGHT  10/19/2017  . IR US GUIDE VASC ACCESS RIGHT  10/19/2017  . LEFT HEART CATH AND CORONARY ANGIOGRAPHY N/A 11/01/2017   Procedure: LEFT HEART CATH AND CORONARY ANGIOGRAPHY;  Surgeon: Belva Crome, MD;  Location: [facility];  Service: Cardiovascular;  Laterality: N/A;  . LOWER EXTREMITY ANGIOGRAPHY N/A 12/27/2018   Procedure: LOWER EXTREMITY ANGIOGRAPHY - Right Lower;  Surgeon: Serafina Mitchell, MD;  Location: [facility];  Service: Cardiovascular;  Laterality: N/A;  . PERIPHERAL VASCULAR INTERVENTION Left 11/07/2017   Procedure: PERIPHERAL VASCULAR INTERVENTION;  Surgeon: Waynetta Sandy, MD;  Location: [facility];  Service: Cardiovascular;  Laterality: Left;  left SFA  . PERIPHERAL VASCULAR INTERVENTION Right 12/27/2018   Procedure: PERIPHERAL VASCULAR INTERVENTION;  Surgeon: Serafina Mitchell, MD;  Location: [facility];  Service: Cardiovascular;  Laterality: Right;  SFA  . SHOULDER OPEN ROTATOR CUFF REPAIR Left   . TUBAL LIGATION    . ULTRASOUND GUIDANCE FOR VASCULAR ACCESS  11/01/2017   Procedure: Ultrasound Guidance For Vascular Access;  Surgeon: Belva Crome, MD;   Location: [facility];  Service: Cardiovascular;;   Social History   Occupational History  . Occupation: disabled  Tobacco Use  . Smoking status: Never Smoker  . Smokeless tobacco: Never Used  Substance and Sexual Activity  . Alcohol use: No  . Drug use: No  . Sexual activity: Never

## 2019-01-24 ENCOUNTER — Encounter: Payer: Self-pay | Admitting: *Deleted

## 2019-01-24 ENCOUNTER — Telehealth: Payer: Self-pay | Admitting: *Deleted

## 2019-01-24 ENCOUNTER — Other Ambulatory Visit: Payer: Self-pay | Admitting: *Deleted

## 2019-01-24 NOTE — Progress Notes (Signed)
Pre-op instruction letter faxed to  at [facility] and left a voice mail message with same instructions.

## 2019-01-24 NOTE — Telephone Encounter (Signed)
Spoke with Kristen Berry at [facility]. Patient had post op visit with Dr. Sharol Given on 01/23/2019. "Stump healing well no infection. Patient is taking Brilinta but no Eliquis. HD via [place] on M, W, F at [facility]. Doing quite well overall. Informed her that I will schedule procedure for Angiogram left leg and superficialization A/V Fistula and contact her with information.

## 2019-01-25 ENCOUNTER — Telehealth: Payer: Self-pay | Admitting: *Deleted

## 2019-01-25 NOTE — Telephone Encounter (Signed)
Spoke with Hoyle Sauer at [facility]. Confirmed receipt of pre-op letter from [facility] and transportation aware of date and time.

## 2019-01-29 DIAGNOSIS — D631 Anemia in chronic kidney disease: Secondary | ICD-10-CM | POA: Insufficient documentation

## 2019-02-02 ENCOUNTER — Other Ambulatory Visit: Payer: Self-pay | Admitting: *Deleted

## 2019-02-07 ENCOUNTER — Telehealth: Payer: Self-pay | Admitting: *Deleted

## 2019-02-07 NOTE — Telephone Encounter (Signed)
Call to SNF.[facility].  Patient has been discharged to home. Unable to reach patient no answer no voice mail. Called brother and called [facility]. Letter of pre-op instructions with new arrival time of 9 am on 02/15/2019 faxed to [facility] who will review and give to patient.

## 2019-02-12 ENCOUNTER — Telehealth: Payer: Self-pay | Admitting: Orthopedic Surgery

## 2019-02-12 NOTE — Telephone Encounter (Signed)
Kristen Berry was called and given VO for PT

## 2019-02-12 NOTE — Telephone Encounter (Signed)
Bethann Humble physical therapist called in to get verbal order for this pt for ph 1 time a week for one 1 week and 2 times a week for 7 weeks.

## 2019-02-13 ENCOUNTER — Ambulatory Visit (INDEPENDENT_AMBULATORY_CARE_PROVIDER_SITE_OTHER): Payer: Medicare HMO | Admitting: Orthopedic Surgery

## 2019-02-13 ENCOUNTER — Encounter: Payer: Self-pay | Admitting: Orthopedic Surgery

## 2019-02-13 ENCOUNTER — Other Ambulatory Visit: Payer: Self-pay

## 2019-02-13 VITALS — Ht 68.0 in | Wt 170.0 lb

## 2019-02-13 DIAGNOSIS — Z992 Dependence on renal dialysis: Secondary | ICD-10-CM

## 2019-02-13 DIAGNOSIS — Z89511 Acquired absence of right leg below knee: Secondary | ICD-10-CM

## 2019-02-13 DIAGNOSIS — I739 Peripheral vascular disease, unspecified: Secondary | ICD-10-CM

## 2019-02-13 DIAGNOSIS — N186 End stage renal disease: Secondary | ICD-10-CM

## 2019-02-13 DIAGNOSIS — E1142 Type 2 diabetes mellitus with diabetic polyneuropathy: Secondary | ICD-10-CM

## 2019-02-13 DIAGNOSIS — Z7901 Long term (current) use of anticoagulants: Secondary | ICD-10-CM

## 2019-02-13 NOTE — Progress Notes (Signed)
Office Visit Note   Patient: Kristen Berry           Date of Birth: [birth year]           MRN: [id number] Visit Date: 02/13/2019              Requested by: Martinique, Sarah T, MD [place], [place] [address] PCP: Martinique, Sarah T, MD  Chief Complaint  Patient presents with  . Right Leg - Routine Post Op    01/05/2019 right leg BKA      HPI: The patient is a 63 year old woman who is seen for postoperative follow-up following a right transtibial amputation on 01/05/2019.  She is about 5 weeks postop.  She reports no pain over the area and she has been wearing a medical shrinker stocking and her limb protector as instructed.  She reports some minimal phantom pain but reports that this is improved after she takes Tylenol.  She is very pleased with her progress.  Assessment & Plan: Visit Diagnoses:  1. Acquired absence of right lower extremity below knee ([place])   2. Type 2 diabetes mellitus with diabetic polyneuropathy, without long-term current use of insulin ([place])   3. PAD (peripheral artery disease) ([place])   4. ESRD on hemodialysis ([place])   5. Chronic anticoagulation     Plan: Patient was prescribed a K3 prosthetic and given a prescription for this to be fabricated by [facility].  The patient should be a community ambulator with the ability to walk at varying speeds in the community to attend community events and services.  She does require the ability to change speeds while walking in public places and also needs to be able to walk on uneven surfaces such as grass, gravel, curbs, ramps, stairs and bleachers. She will follow-up here in 4 weeks.  Follow-Up Instructions: Return in about 4 weeks (around 03/13/2019).   Ortho Exam  Patient is alert, oriented, no adenopathy, well-dressed, normal affect, normal respiratory effort. The patient's right transtibial amputation site is healing well and without signs of cellulitis or infection.  The incision is intact with some  minimal crusting over several areas but no drainage.  She has mild edema residually.  She has full knee extension.    Imaging: No results found. No images are attached to the encounter.  Labs: Lab Results  Component Value Date   HGBA1C 5.5 12/22/2018   HGBA1C 5.5 10/13/2017   HGBA1C 6.6 (H) 06/28/2013   ESRSEDRATE 100 (H) 12/21/2018   ESRSEDRATE 127 (H) 10/27/2017   CRP 18.8 (H) 10/27/2017   REPTSTATUS 01/07/2019 FINAL 01/02/2019   REPTSTATUS 01/07/2019 FINAL 01/02/2019   CULT  01/02/2019    NO GROWTH 5 DAYS Performed at [facility], [place] [address], [place], [place] [address]    CULT  01/02/2019    NO GROWTH 5 DAYS Performed at [facility] [address] [place],  [address]      Lab Results  Component Value Date   ALBUMIN 2.7 (L) 01/06/2019   ALBUMIN 2.6 (L) 01/05/2019   ALBUMIN 2.5 (L) 01/04/2019    Body mass index is 25.85 kg/m.  Orders:  No orders of the defined types were placed in this encounter.  No orders of the defined types were placed in this encounter.    Procedures: No procedures performed  Clinical Data: No additional findings.  ROS:  All other systems negative, except as noted in the HPI. Review of Systems  Objective:  Vital Signs: Ht 5\' 8"  (1.727 m)   Wt 170 lb (77.1 kg)   BMI 25.85 kg/m   Specialty Comments:  No specialty comments available.  PMFS History: Patient Active Problem List   Diagnosis Date Noted  . Below knee amputation ([place])   . S/P BKA (below knee amputation), right ([place]) 01/05/2019  . ESRD on hemodialysis ([place])   . Coronary artery disease without angina pectoris   . Cellulitis 01/02/2019  . Gangrene of right foot ([place])   . Right foot infection 12/21/2018  . PAD (peripheral artery disease) ([place]) 01/24/2018  . Peripheral artery disease ([place]) 01/24/2018  . C. difficile diarrhea 01/06/2018  . Hypokalemia 01/06/2018  . ESRD (end stage renal disease) ([place]) 01/06/2018  . Prolonged QT interval  01/06/2018  . Hypotension 01/06/2018  . Multiple rib fractures 01/06/2018  . Pressure injury of skin 11/08/2017  . Coronary artery disease due to lipid rich plaque   . Chest pain   . Acute on chronic systolic CHF (congestive heart failure), NYHA class 3 ([place]) 10/18/2017  . PICC (peripherally inserted central catheter) in place   . Acute osteomyelitis of toe of left foot ([place])   . ARF (acute renal failure) ([place]) 10/10/2017  . AKI (acute kidney injury) ([place]) 10/10/2017  . Essential hypertension 12/14/2016  . Abnormal bone xray 06/15/2016  . MGUS (monoclonal gammopathy of unknown significance) 05/18/2016  . Stage 4 chronic kidney disease ([place]) 05/18/2016  . CHF (congestive heart failure) ([place]) 05/18/2016  . Renal insufficiency 06/28/2013  . Other and unspecified hyperlipidemia 06/28/2013  . Obesity, unspecified 06/28/2013  . Pain in limb 06/28/2013  . Diabetes mellitus type 2, insulin dependent ([place]) 06/28/2013   Past Medical History:  Diagnosis Date  . Anemia   . Arthritis    "hands, knees" (10/10/2017)  . Asthma   . CHF (congestive heart failure) ([place])   . Chronic kidney disease (CKD), stage IV (severe) ([place])    Dialysis T/ Th/ Sat  . Coronary artery disease   . H/O Clostridium difficile infection   . High cholesterol   . History of kidney stones   . Hypertension   . PVD (peripheral vascular disease) ([place])    "LLE; will have OR" (10/10/2017)  . Sleep apnea    "never given mask" (10/10/2017)  . Stroke [facility])    mild stroke mid April - 2019  . Type II diabetes mellitus ([place])    "no RX anymore" (10/10/2017)    Family History  Problem Relation Age of Onset  . Hypertension Father   . Heart failure Maternal Grandmother   . Heart failure Maternal Grandfather     Past Surgical History:  Procedure Laterality Date  . A/V FISTULAGRAM N/A 12/27/2018   Procedure: A/V FISTULAGRAM - Left Upper;  Surgeon: Serafina Mitchell, MD;  Location: [facility];  Service: Cardiovascular;   Laterality: N/A;  . AMPUTATION Left 11/10/2017   Procedure: AMPUTATION DIGIT SECOND TOE LEFT FOOT;  Surgeon: Waynetta Sandy, MD;  Location: [facility];  Service: Vascular;  Laterality: Left;  . AMPUTATION Left 01/24/2018   Procedure: AMPUTATION THIRD TOE LEFT FOOT;  Surgeon: Waynetta Sandy, MD;  Location: [facility];  Service: Vascular;  Laterality: Left;  . AMPUTATION Right 12/22/2018   Procedure: Right foot 1st ray amputation;  Surgeon: Newt Minion, MD;  Location: [facility];  Service: Orthopedics;  Laterality: Right;  . AMPUTATION Right 01/05/2019   Procedure: RIGHT BELOW KNEE AMPUTATION;  Surgeon: Newt Minion, MD;  Location:  [facility];  Service: Orthopedics;  Laterality: Right;  . AORTOGRAM N/A 11/03/2017   Procedure: Ultrasound Guided Cannulation Right Common Femoral Artery;  Aortagram with Left Lower Extremity Arteriogram; Attempted Treatment Left Superficial Femoral Artery; Percutaneous Closure Right Common Femoral Arteriotomy with Proglide Device;  Surgeon: Waynetta Sandy, MD;  Location: [facility];  Service: Vascular;  Laterality: N/A;  . APPLICATION OF WOUND VAC Right 01/05/2019   Procedure: Application Of Wound Vac;  Surgeon: Newt Minion, MD;  Location: [facility];  Service: Orthopedics;  Laterality: Right;  . AV FISTULA PLACEMENT Left 11/10/2017   Procedure: ARTERIOVENOUS (AV) FISTULA CREATION LEFT UPPER ARM;  Surgeon: Waynetta Sandy, MD;  Location: [facility];  Service: Vascular;  Laterality: Left;  . HERNIA REPAIR  1950s  . INSERTION OF DIALYSIS CATHETER Right 11/10/2017   Procedure: INSERTION OF TUNNELED DIALYSIS CATHETER RIGHT INTERNAL JUGULAR PLACEMENT;  Surgeon: Waynetta Sandy, MD;  Location: [facility];  Service: Vascular;  Laterality: Right;  . IR FLUORO GUIDE CV LINE RIGHT  10/19/2017  . IR US GUIDE VASC ACCESS RIGHT  10/19/2017  . LEFT HEART CATH AND CORONARY ANGIOGRAPHY N/A 11/01/2017   Procedure: LEFT HEART CATH AND CORONARY ANGIOGRAPHY;  Surgeon: Belva Crome, MD;  Location: [facility];  Service: Cardiovascular;  Laterality: N/A;  . LOWER EXTREMITY ANGIOGRAPHY N/A 12/27/2018   Procedure: LOWER EXTREMITY ANGIOGRAPHY - Right Lower;  Surgeon: Serafina Mitchell, MD;  Location: [facility];  Service: Cardiovascular;  Laterality: N/A;  . PERIPHERAL VASCULAR INTERVENTION Left 11/07/2017   Procedure: PERIPHERAL VASCULAR INTERVENTION;  Surgeon: Waynetta Sandy, MD;  Location: [facility];  Service: Cardiovascular;  Laterality: Left;  left SFA  . PERIPHERAL VASCULAR INTERVENTION Right 12/27/2018   Procedure: PERIPHERAL VASCULAR INTERVENTION;  Surgeon: Serafina Mitchell, MD;  Location: [facility];  Service: Cardiovascular;  Laterality: Right;  SFA  . SHOULDER OPEN ROTATOR CUFF REPAIR Left   . TUBAL LIGATION    . ULTRASOUND GUIDANCE FOR VASCULAR ACCESS  11/01/2017   Procedure: Ultrasound Guidance For Vascular Access;  Surgeon: Belva Crome, MD;  Location: [facility];  Service: Cardiovascular;;   Social History   Occupational History  . Occupation: disabled  Tobacco Use  . Smoking status: Never Smoker  . Smokeless tobacco: Never Used  Substance and Sexual Activity  . Alcohol use: No  . Drug use: No  . Sexual activity: Never

## 2019-02-14 ENCOUNTER — Other Ambulatory Visit: Payer: Self-pay | Admitting: *Deleted

## 2019-02-14 ENCOUNTER — Telehealth: Payer: Self-pay | Admitting: *Deleted

## 2019-02-14 ENCOUNTER — Encounter: Payer: Self-pay | Admitting: *Deleted

## 2019-02-14 NOTE — Telephone Encounter (Signed)
Call from [facility]. Requested patient have [place] exchange in addition to other surgical procedures scheduled for 02/15/2019 with Dr. Trula Slade.

## 2019-02-14 NOTE — Pre-Procedure Instructions (Addendum)
   Kristen Berry  02/14/2019     [facility] - Kristen Berry, [place] [address] Phone: [phone] Fax: [phone]   Your procedure is scheduled on Thursday, Feb 15, 2019  Report to [facility] Admitting at 9:00 A.M. (per MD)  Call this number if you have problems the morning of surgery:  [phone]   Remember:Brush your teeth the morning of surgery with your regular toothpaste.   Do not eat or drink after midnight.  Take these medicines the morning of surgery with A SIP OF WATER : aspirin, carvedilol (COREG), hydrALAZINE (APRESOLINE), pantoprazole (PROTONIX)  If needed: Tylenol, albuterol (PROAIR HFA) inhaler ( bring inhaler in with you on day of surgery) Follow the surgeon's instructions regarding your ticagrelor (BRILINTA).  Stop taking vitamins, fish oil and herbal medications. Do not take any NSAIDs ie: Ibuprofen, Advil, Naproxen (Aleve), Motrin, BC and Goody Powder; stop now.   Do not wear jewelry, make-up or nail polish.  Do not wear lotions, powders, or perfumes, or deodorant.  Do not shave 48 hours prior to surgery.    Do not bring valuables to the hospital.  [facility] is not responsible for any belongings or valuables.  Contacts, dentures or bridgework may not be worn into surgery.   Patients discharged the day of surgery will not be allowed to drive home.  Please read over the following fact sheets that you were given.

## 2019-02-14 NOTE — Progress Notes (Signed)
Nurse made several unsuccessful attempts to contact pt. Nurse faxed pre-op instructions to [facility]. Pt nurse, Corey Skains, RN, will provide pt with pre-op instructions.  Jada instructed to have pt call nurse if she has any questions. Jada verbalized understanding.

## 2019-02-15 ENCOUNTER — Other Ambulatory Visit: Payer: Self-pay

## 2019-02-15 ENCOUNTER — Ambulatory Visit (HOSPITAL_COMMUNITY): Payer: Medicare HMO

## 2019-02-15 ENCOUNTER — Other Ambulatory Visit (HOSPITAL_COMMUNITY): Payer: Self-pay | Admitting: *Deleted

## 2019-02-15 ENCOUNTER — Ambulatory Visit (HOSPITAL_COMMUNITY): Payer: Medicare HMO | Admitting: Anesthesiology

## 2019-02-15 ENCOUNTER — Ambulatory Visit (HOSPITAL_COMMUNITY)
Admission: RE | Admit: 2019-02-15 | Discharge: 2019-02-15 | Disposition: A | Discharge status: Home / Self Care | Payer: Medicare HMO | Attending: Surgery | Admitting: Surgery

## 2019-02-15 ENCOUNTER — Encounter (HOSPITAL_COMMUNITY)
Admission: RE | Disposition: A | Discharge status: Home / Self Care | Payer: Self-pay | Source: Home / Self Care | Attending: Surgery

## 2019-02-15 DIAGNOSIS — Z1159 Encounter for screening for other viral diseases: Secondary | ICD-10-CM | POA: Diagnosis not present

## 2019-02-15 DIAGNOSIS — Z95820 Peripheral vascular angioplasty status with implants and grafts: Secondary | ICD-10-CM | POA: Insufficient documentation

## 2019-02-15 DIAGNOSIS — Z8673 Personal history of transient ischemic attack (TIA), and cerebral infarction without residual deficits: Secondary | ICD-10-CM | POA: Diagnosis not present

## 2019-02-15 DIAGNOSIS — I2510 Atherosclerotic heart disease of native coronary artery without angina pectoris: Secondary | ICD-10-CM | POA: Insufficient documentation

## 2019-02-15 DIAGNOSIS — I132 Hypertensive heart and chronic kidney disease with heart failure and with stage 5 chronic kidney disease, or end stage renal disease: Secondary | ICD-10-CM | POA: Diagnosis not present

## 2019-02-15 DIAGNOSIS — Z89511 Acquired absence of right leg below knee: Secondary | ICD-10-CM | POA: Diagnosis not present

## 2019-02-15 DIAGNOSIS — Z7982 Long term (current) use of aspirin: Secondary | ICD-10-CM | POA: Insufficient documentation

## 2019-02-15 DIAGNOSIS — N186 End stage renal disease: Secondary | ICD-10-CM | POA: Diagnosis present

## 2019-02-15 DIAGNOSIS — M1990 Unspecified osteoarthritis, unspecified site: Secondary | ICD-10-CM | POA: Diagnosis not present

## 2019-02-15 DIAGNOSIS — G4730 Sleep apnea, unspecified: Secondary | ICD-10-CM | POA: Insufficient documentation

## 2019-02-15 DIAGNOSIS — N185 Chronic kidney disease, stage 5: Secondary | ICD-10-CM | POA: Diagnosis not present

## 2019-02-15 DIAGNOSIS — I739 Peripheral vascular disease, unspecified: Secondary | ICD-10-CM

## 2019-02-15 DIAGNOSIS — I5021 Acute systolic (congestive) heart failure: Secondary | ICD-10-CM | POA: Insufficient documentation

## 2019-02-15 DIAGNOSIS — I70292 Other atherosclerosis of native arteries of extremities, left leg: Secondary | ICD-10-CM | POA: Insufficient documentation

## 2019-02-15 DIAGNOSIS — E1122 Type 2 diabetes mellitus with diabetic chronic kidney disease: Secondary | ICD-10-CM | POA: Insufficient documentation

## 2019-02-15 DIAGNOSIS — Z9851 Tubal ligation status: Secondary | ICD-10-CM | POA: Diagnosis not present

## 2019-02-15 DIAGNOSIS — E669 Obesity, unspecified: Secondary | ICD-10-CM | POA: Diagnosis not present

## 2019-02-15 DIAGNOSIS — Z6825 Body mass index (BMI) 25.0-25.9, adult: Secondary | ICD-10-CM | POA: Diagnosis not present

## 2019-02-15 DIAGNOSIS — Z79899 Other long term (current) drug therapy: Secondary | ICD-10-CM | POA: Insufficient documentation

## 2019-02-15 DIAGNOSIS — Z992 Dependence on renal dialysis: Secondary | ICD-10-CM | POA: Diagnosis not present

## 2019-02-15 DIAGNOSIS — J45909 Unspecified asthma, uncomplicated: Secondary | ICD-10-CM | POA: Insufficient documentation

## 2019-02-15 DIAGNOSIS — N179 Acute kidney failure, unspecified: Secondary | ICD-10-CM | POA: Insufficient documentation

## 2019-02-15 HISTORY — PX: FISTULA SUPERFICIALIZATION: SHX6341

## 2019-02-15 HISTORY — PX: ULTRASOUND GUIDANCE FOR VASCULAR ACCESS: SHX6516

## 2019-02-15 HISTORY — PX: LOWER EXTREMITY ANGIOGRAM: SHX5508

## 2019-02-15 HISTORY — PX: EXCHANGE OF A DIALYSIS CATHETER: SHX5818

## 2019-02-15 LAB — PROTIME-INR
INR: 1.1 (ref 0.8–1.2)
Prothrombin Time: 14.4 seconds (ref 11.4–15.2)

## 2019-02-15 LAB — POCT I-STAT, CHEM 8
BUN: 33 mg/dL — ABNORMAL HIGH (ref 8–23)
Calcium, Ion: 1.1 mmol/L — ABNORMAL LOW (ref 1.15–1.40)
Chloride: 102 mmol/L (ref 98–111)
Creatinine, Ser: 3.7 mg/dL — ABNORMAL HIGH (ref 0.44–1.00)
Glucose, Bld: 150 mg/dL — ABNORMAL HIGH (ref 70–99)
HCT: 40 % (ref 36.0–46.0)
Hemoglobin: 13.6 g/dL (ref 12.0–15.0)
Potassium: 4.3 mmol/L (ref 3.5–5.1)
Sodium: 137 mmol/L (ref 135–145)
TCO2: 25 mmol/L (ref 22–32)

## 2019-02-15 LAB — SURGICAL PCR SCREEN
MRSA, PCR: NEGATIVE
Staphylococcus aureus: NEGATIVE

## 2019-02-15 LAB — GLUCOSE, CAPILLARY
Glucose-Capillary: 140 mg/dL — ABNORMAL HIGH (ref 70–99)
Glucose-Capillary: 127 mg/dL — ABNORMAL HIGH (ref 70–99)

## 2019-02-15 LAB — SARS CORONAVIRUS 2 BY RT PCR (HOSPITAL ORDER, PERFORMED IN ~~LOC~~ HOSPITAL LAB): SARS Coronavirus 2: NEGATIVE

## 2019-02-15 SURGERY — FISTULA SUPERFICIALIZATION
Anesthesia: General | Site: Groin | Laterality: Right

## 2019-02-15 MED ORDER — MIDAZOLAM HCL 5 MG/5ML IJ SOLN
INTRAMUSCULAR | Status: DC | PRN
  Administered 2019-02-15: 1 mg via INTRAVENOUS

## 2019-02-15 MED ORDER — HYDROCODONE-ACETAMINOPHEN 5-325 MG PO TABS: 1.0000 | ORAL_TABLET | Freq: Four times a day (QID) | ORAL | 0 refills | Status: DC | PRN

## 2019-02-15 MED ORDER — LABETALOL HCL 5 MG/ML IV SOLN: 10.0000 mg | INTRAVENOUS | Status: DC | PRN

## 2019-02-15 MED ORDER — LIDOCAINE HCL (CARDIAC) PF 100 MG/5ML IV SOSY
PREFILLED_SYRINGE | INTRAVENOUS | Status: DC | PRN
  Administered 2019-02-15: 100 mg via INTRAVENOUS

## 2019-02-15 MED ORDER — HYDRALAZINE HCL 20 MG/ML IJ SOLN: 5.0000 mg | INTRAMUSCULAR | Status: DC | PRN

## 2019-02-15 MED ORDER — ACETAMINOPHEN 325 MG PO TABS: 650.0000 mg | ORAL_TABLET | ORAL | Status: DC | PRN

## 2019-02-15 MED ORDER — PROPOFOL 10 MG/ML IV BOLUS
INTRAVENOUS | Status: DC | PRN
  Administered 2019-02-15: 100 mg via INTRAVENOUS
  Administered 2019-02-15: 40 mg via INTRAVENOUS

## 2019-02-15 MED ORDER — PHENYLEPHRINE 40 MCG/ML (10ML) SYRINGE FOR IV PUSH (FOR BLOOD PRESSURE SUPPORT)
PREFILLED_SYRINGE | INTRAVENOUS | Status: AC
  Filled 2019-02-15: qty 10

## 2019-02-15 MED ORDER — SODIUM CHLORIDE 0.9% FLUSH: 3.0000 mL | INTRAVENOUS | Status: DC | PRN

## 2019-02-15 MED ORDER — FENTANYL CITRATE (PF) 250 MCG/5ML IJ SOLN
INTRAMUSCULAR | Status: DC | PRN
  Administered 2019-02-15: 25 ug via INTRAVENOUS
  Administered 2019-02-15: 100 ug via INTRAVENOUS
  Administered 2019-02-15: 25 ug via INTRAVENOUS

## 2019-02-15 MED ORDER — SODIUM CHLORIDE 0.9 % IV SOLN
INTRAVENOUS | Status: AC
  Filled 2019-02-15: qty 1.2

## 2019-02-15 MED ORDER — SODIUM CHLORIDE 0.9 % IV SOLN
INTRAVENOUS | Status: DC
  Administered 2019-02-15 (×2): via INTRAVENOUS

## 2019-02-15 MED ORDER — CEFAZOLIN SODIUM-DEXTROSE 2-4 GM/100ML-% IV SOLN
INTRAVENOUS | Status: AC
  Filled 2019-02-15: qty 100

## 2019-02-15 MED ORDER — MIDAZOLAM HCL 2 MG/2ML IJ SOLN
INTRAMUSCULAR | Status: AC
  Filled 2019-02-15: qty 2

## 2019-02-15 MED ORDER — SODIUM CHLORIDE 0.9% FLUSH: 3.0000 mL | Freq: Two times a day (BID) | INTRAVENOUS | Status: DC

## 2019-02-15 MED ORDER — LIDOCAINE 2% (20 MG/ML) 5 ML SYRINGE
INTRAMUSCULAR | Status: AC
  Filled 2019-02-15: qty 15

## 2019-02-15 MED ORDER — FENTANYL CITRATE (PF) 100 MCG/2ML IJ SOLN
25.0000 ug | INTRAMUSCULAR | Status: DC | PRN
  Administered 2019-02-15 (×2): 50 ug via INTRAVENOUS

## 2019-02-15 MED ORDER — OXYCODONE HCL 5 MG PO TABS: 5.0000 mg | ORAL_TABLET | Freq: Once | ORAL | Status: DC | PRN

## 2019-02-15 MED ORDER — 0.9 % SODIUM CHLORIDE (POUR BTL) OPTIME
TOPICAL | Status: DC | PRN
  Administered 2019-02-15: 1000 mL

## 2019-02-15 MED ORDER — FENTANYL CITRATE (PF) 100 MCG/2ML IJ SOLN
INTRAMUSCULAR | Status: AC
  Filled 2019-02-15: qty 2

## 2019-02-15 MED ORDER — ONDANSETRON HCL 4 MG/2ML IJ SOLN
INTRAMUSCULAR | Status: DC | PRN
  Administered 2019-02-15: 4 mg via INTRAVENOUS

## 2019-02-15 MED ORDER — SODIUM CHLORIDE 0.9 % IV SOLN
INTRAVENOUS | Status: DC | PRN
  Administered 2019-02-15: 500 mL

## 2019-02-15 MED ORDER — ONDANSETRON HCL 4 MG/2ML IJ SOLN
INTRAMUSCULAR | Status: AC
  Filled 2019-02-15: qty 2

## 2019-02-15 MED ORDER — OXYCODONE HCL 5 MG/5ML PO SOLN: 5.0000 mg | Freq: Once | ORAL | Status: DC | PRN

## 2019-02-15 MED ORDER — SODIUM CHLORIDE 0.9 % IV SOLN
INTRAVENOUS | Status: DC | PRN
  Administered 2019-02-15: 25 ug/min via INTRAVENOUS

## 2019-02-15 MED ORDER — DEXAMETHASONE SODIUM PHOSPHATE 10 MG/ML IJ SOLN
INTRAMUSCULAR | Status: AC
  Filled 2019-02-15: qty 1

## 2019-02-15 MED ORDER — DEXAMETHASONE SODIUM PHOSPHATE 10 MG/ML IJ SOLN
INTRAMUSCULAR | Status: DC | PRN
  Administered 2019-02-15: 4 mg via INTRAVENOUS

## 2019-02-15 MED ORDER — HEPARIN SODIUM (PORCINE) 1000 UNIT/ML IJ SOLN
INTRAMUSCULAR | Status: AC
  Filled 2019-02-15: qty 1

## 2019-02-15 MED ORDER — FENTANYL CITRATE (PF) 250 MCG/5ML IJ SOLN
INTRAMUSCULAR | Status: AC
  Filled 2019-02-15: qty 5

## 2019-02-15 MED ORDER — SODIUM CHLORIDE 0.9 % IV SOLN: 250.0000 mL | INTRAVENOUS | Status: DC | PRN

## 2019-02-15 MED ORDER — CEFAZOLIN SODIUM-DEXTROSE 2-4 GM/100ML-% IV SOLN
2.0000 g | INTRAVENOUS | Status: AC
  Administered 2019-02-15: 2 g via INTRAVENOUS

## 2019-02-15 MED ORDER — IODIXANOL 320 MG/ML IV SOLN
INTRAVENOUS | Status: DC | PRN
  Administered 2019-02-15: 40 mL via INTRAVENOUS

## 2019-02-15 MED ORDER — CHLORHEXIDINE GLUCONATE 4 % EX LIQD: 60.0000 mL | Freq: Once | CUTANEOUS | Status: DC

## 2019-02-15 MED ORDER — HEPARIN SODIUM (PORCINE) 1000 UNIT/ML IJ SOLN
INTRAMUSCULAR | Status: DC | PRN
  Administered 2019-02-15: 3400 [IU]

## 2019-02-15 SURGICAL SUPPLY — 86 items
ARMBAND PINK RESTRICT EXTREMIT (MISCELLANEOUS) ×5 IMPLANT
BAG BANDED W/RUBBER/TAPE 36X54 (MISCELLANEOUS) ×5 IMPLANT
BAG DECANTER FOR FLEXI CONT (MISCELLANEOUS) ×5 IMPLANT
BAG SNAP BAND KOVER 36X36 (MISCELLANEOUS) ×5 IMPLANT
BIOPATCH RED 1 DISK 7.0 (GAUZE/BANDAGES/DRESSINGS) ×5 IMPLANT
BLADE SURG 11 STRL SS (BLADE) ×5 IMPLANT
CANISTER SUCT 3000ML PPV (MISCELLANEOUS) ×5 IMPLANT
CATH ANGIO 5F BER2 65CM (CATHETERS) IMPLANT
CATH OMNI FLUSH .035X70CM (CATHETERS) ×5 IMPLANT
CATH PALINDROME RT-P 15FX19CM (CATHETERS) IMPLANT
CATH PALINDROME RT-P 15FX23CM (CATHETERS) ×5 IMPLANT
CATH PALINDROME RT-P 15FX28CM (CATHETERS) IMPLANT
CATH PALINDROME RT-P 15FX55CM (CATHETERS) IMPLANT
CHLORAPREP W/TINT 26ML (MISCELLANEOUS) ×5 IMPLANT
CLIP VESOCCLUDE MED 6/CT (CLIP) ×5 IMPLANT
CLIP VESOCCLUDE SM WIDE 6/CT (CLIP) ×5 IMPLANT
CLOSURE MYNX CONTROL 5F (Vascular Products) ×5 IMPLANT
COVER BACK TABLE 60X90IN (DRAPES) ×5 IMPLANT
COVER DOME SNAP 22 D (MISCELLANEOUS) ×5 IMPLANT
COVER PROBE W GEL 5X96 (DRAPES) ×10 IMPLANT
COVER SURGICAL LIGHT HANDLE (MISCELLANEOUS) ×5 IMPLANT
COVER WAND RF STERILE (DRAPES) ×5 IMPLANT
DERMABOND ADHESIVE PROPEN (GAUZE/BANDAGES/DRESSINGS) ×1
DERMABOND ADVANCED (GAUZE/BANDAGES/DRESSINGS) ×4
DERMABOND ADVANCED .7 DNX12 (GAUZE/BANDAGES/DRESSINGS) ×16 IMPLANT
DERMABOND ADVANCED .7 DNX6 (GAUZE/BANDAGES/DRESSINGS) ×4 IMPLANT
DEVICE TORQUE H2O (MISCELLANEOUS) IMPLANT
DRAPE C-ARM 42X72 X-RAY (DRAPES) ×5 IMPLANT
DRAPE CHEST BREAST 15X10 FENES (DRAPES) ×5 IMPLANT
DRAPE FEMORAL ANGIO 80X135IN (DRAPES) ×5 IMPLANT
DRAPE ORTHO SPLIT 77X108 STRL (DRAPES) ×1
DRAPE SURG ORHT 6 SPLT 77X108 (DRAPES) ×4 IMPLANT
DRAPE X-RAY CASS 24X20 (DRAPES) IMPLANT
DRSG TEGADERM 2-3/8X2-3/4 SM (GAUZE/BANDAGES/DRESSINGS) ×5 IMPLANT
ELECT REM PT RETURN 9FT ADLT (ELECTROSURGICAL) ×5
ELECTRODE REM PT RTRN 9FT ADLT (ELECTROSURGICAL) ×4 IMPLANT
GAUZE 4X4 16PLY RFD (DISPOSABLE) ×5 IMPLANT
GAUZE SPONGE 2X2 8PLY STRL LF (GAUZE/BANDAGES/DRESSINGS) ×4 IMPLANT
GLOVE BIOGEL PI IND STRL 7.5 (GLOVE) ×4 IMPLANT
GLOVE BIOGEL PI INDICATOR 7.5 (GLOVE) ×1
GLOVE INDICATOR 7.0 STRL GRN (GLOVE) ×15 IMPLANT
GLOVE SURG SS PI 7.5 STRL IVOR (GLOVE) ×5 IMPLANT
GOWN STRL REUS W/ TWL LRG LVL3 (GOWN DISPOSABLE) ×8 IMPLANT
GOWN STRL REUS W/ TWL XL LVL3 (GOWN DISPOSABLE) ×8 IMPLANT
GOWN STRL REUS W/TWL LRG LVL3 (GOWN DISPOSABLE) ×2
GOWN STRL REUS W/TWL XL LVL3 (GOWN DISPOSABLE) ×2
GUIDEWIRE ANGLED .035X150CM (WIRE) IMPLANT
HEMOSTAT SNOW SURGICEL 2X4 (HEMOSTASIS) IMPLANT
KIT BASIN OR (CUSTOM PROCEDURE TRAY) ×5 IMPLANT
KIT TURNOVER KIT B (KITS) ×5 IMPLANT
NEEDLE 18GX1X1/2 (RX/OR ONLY) (NEEDLE) ×10 IMPLANT
NEEDLE HYPO 25GX1X1/2 BEV (NEEDLE) ×5 IMPLANT
NEEDLE PERC 18GX7CM (NEEDLE) ×5 IMPLANT
NS IRRIG 1000ML POUR BTL (IV SOLUTION) ×5 IMPLANT
PACK CV ACCESS (CUSTOM PROCEDURE TRAY) ×5 IMPLANT
PACK SURGICAL SETUP 50X90 (CUSTOM PROCEDURE TRAY) ×5 IMPLANT
PAD ARMBOARD 7.5X6 YLW CONV (MISCELLANEOUS) ×10 IMPLANT
PROTECTION STATION PRESSURIZED (MISCELLANEOUS) ×5
SET MICROPUNCTURE 5F STIFF (MISCELLANEOUS) ×5 IMPLANT
SHEATH AVANTI 11CM 5FR (SHEATH) IMPLANT
SHEATH PINNACLE 5F 10CM (SHEATH) ×5 IMPLANT
SHEATH PINNACLE 6F 10CM (SHEATH) ×5 IMPLANT
SHEATH PINNACLE ST 6F 45CM (SHEATH) ×5 IMPLANT
SOAP 2 % CHG 4 OZ (WOUND CARE) ×5 IMPLANT
SPONGE GAUZE 2X2 STER 10/PKG (GAUZE/BANDAGES/DRESSINGS) ×1
STATION PROTECTION PRESSURIZED (MISCELLANEOUS) ×4 IMPLANT
STOPCOCK MORSE 400PSI 3WAY (MISCELLANEOUS) ×5 IMPLANT
SUT ETHILON 2 0 FS 18 (SUTURE) ×5 IMPLANT
SUT ETHILON 3 0 PS 1 (SUTURE) ×5 IMPLANT
SUT PROLENE 6 0 CC (SUTURE) ×5 IMPLANT
SUT VIC AB 3-0 SH 27 (SUTURE) ×2
SUT VIC AB 3-0 SH 27X BRD (SUTURE) ×8 IMPLANT
SUT VIC AB 4-0 PS2 18 (SUTURE) ×15 IMPLANT
SUT VICRYL 4-0 PS2 18IN ABS (SUTURE) ×5 IMPLANT
SYR 10ML LL (SYRINGE) ×20 IMPLANT
SYR 20CC LL (SYRINGE) ×10 IMPLANT
SYR 30ML LL (SYRINGE) ×5 IMPLANT
SYR 5ML LL (SYRINGE) ×5 IMPLANT
SYR CONTROL 10ML LL (SYRINGE) ×5 IMPLANT
SYR MEDRAD MARK V 150ML (SYRINGE) IMPLANT
TOWEL GREEN STERILE (TOWEL DISPOSABLE) ×10 IMPLANT
TUBING HIGH PRESSURE 120CM (CONNECTOR) ×5 IMPLANT
UNDERPAD 30X30 (UNDERPADS AND DIAPERS) ×5 IMPLANT
WATER STERILE IRR 1000ML POUR (IV SOLUTION) ×5 IMPLANT
WIRE BENTSON .035X145CM (WIRE) ×10 IMPLANT
WIRE HI TORQ VERSACORE J 260CM (WIRE) ×5 IMPLANT

## 2019-02-15 NOTE — Op Note (Signed)
Patient name: Kristen Berry MRN: [id number] DOB: January 14, 1956 Sex: female  02/15/2019 Pre-operative Diagnosis: End-stage renal disease, peripheral vascular disease (left leg Post-operative diagnosis:  Same Surgeon:  Annamarie Major Assistants: Arlee Muslim Procedure:   #1: Elevation and branch ligation of left brachiocephalic fistula   #2: Removal of right IJ tunneled dialysis catheter   #3: Placement of a new right internal jugular vein tunneled dialysis catheter   #4: Ultrasound-guided access, right femoral artery   #5: Left lower extremity runoff   #6: Second-order catheterization   #7: Closure device (Mynx) Anesthesia: General Blood Loss: Minimal Specimens: None  Findings: 6 mm cephalic vein was superficialized.  The stents within her left lower extremity are occluded  Indications: The patient comes in today for elevation of her fistula.  Request is also been made to place a new tunneled dialysis catheter as her existing catheter is not functioning.  Her last arteriogram revealed in-stent stenosis on the left and so the plan is to intervene if indicated.  Procedure:  The patient was identified in the holding area and taken to [facility]  The patient was then placed supine on the table. general anesthesia was administered.  The patient was prepped and draped in the usual sterile fashion.  A time out was called and antibiotics were administered.  2 longitudinal incisions were made directly anterior to her cephalic vein fistula.  Through these incisions the vein was circumferentially isolated.  It was deep underneath the subcutaneous tissue.  I ligated multiple venous tributaries between 2-0 silk ties.  The vein measured about 6 mm.  Once it was fully mobilized, I reapproximated the subcutaneous tissue posterior to the fistula with interrupted 3-0 Vicryl suture.  The skin was then closed directly anterior to the fistula with a running 4-0 Vicryl.  Dermabond was placed.  Attention was then  turned towards her dialysis catheter.  I first mobilized the cuff at its skin exit site.  Once this was externalized, I made a incision at the base of the neck.  I dissected out the catheter here and placed a hemostat on it.  I then transected the catheter at the level of the cuff and pulled the catheter out through the neck incision.  A Bentson wire was then inserted which went out the way down into the inferior vena cava.  The catheter was then removed.  There was thrombus within the catheter.  A peel-away sheath was then inserted.  I selected a 23 cm palindrome catheter and advanced it through the peel-away sheath which was removed.  A skin exit site was selected.  11 blade was used to make a skin nick and a subcutaneous tunnel was created.  The catheter was then brought through the tunnel and the cuff was situated the skin exit site.  It was then connected to the port.  Both ports flushed and aspirated without difficulty.  Fluoroscopy identified that the catheter was in good position without kinks.  It was sutured into position with 3-0 nylon in the skin incision the neck was closed with 4-0 Vicryl.  Next I set up for angiography.  The right femoral artery was evaluated ultrasound and found be widely patent.  Was cannulated under ultrasound guidance with a micropuncture needle.  An 018 wire was inserted without resistance and a micropuncture sheath was placed.  A Bentson wire was then inserted the micropuncture was removed and a 5 French sheath was placed.  Using an Omni Flush catheter and the  Bentson wire, the aortic bifurcation was crossed and the cath was placed in the left external iliac artery left lower extremity runoff was performed.  This revealed that the stents within her superficial femoral artery which were previously patent but stenotic had now occluded.  There is reconstitution of above-knee popliteal artery with single-vessel runoff via the anterior tibial artery.  I elected not to intervene.   With there is single-vessel runoff and risk for embolization I felt that if she needed intervention a bypass would be preferable.  Catheter and wire were removed.  The sheath was removed and a minx device was used for closure.  There were no immediate complications.   Disposition: To PACU stable   V. Annamarie Major, M.D., [facility] [place] Office: [phone] Pager:  [phone]

## 2019-02-15 NOTE — H&P (Signed)
   Patient name: Kristen Berry MRN: [id number] DOB: 10-21-1955 Sex: female    HISTORY OF PRESENT ILLNESS:   DARILYN STORBECK is a 63 y.o. female who recently underwent angiography and fistula imaging.  Subsequently she has undergone amputation by Dr. Sharol Given of her right leg.  CURRENT MEDICATIONS:    Current Facility-Administered Medications  Medication Dose Route Frequency Provider Last Rate Last Dose  . 0.9 %  sodium chloride infusion   Intravenous Continuous Serafina Mitchell, MD 10 mL/hr at 02/15/19 1030    . ceFAZolin (ANCEF) IVPB 2g/100 mL premix  2 g Intravenous 30 min Pre-Op Serafina Mitchell, MD      . chlorhexidine (HIBICLENS) 4 % liquid 4 application  60 mL Topical Once Serafina Mitchell, MD       And  . Derrill Memo ON 02/16/2019] chlorhexidine (HIBICLENS) 4 % liquid 4 application  60 mL Topical Once Serafina Mitchell, MD        REVIEW OF SYSTEMS:   [X]  denotes positive finding, [ ]  denotes negative finding Cardiac  Comments:  Chest pain or chest pressure:    Shortness of breath upon exertion:    Short of breath when lying flat:    Irregular heart rhythm:    Constitutional    Fever or chills:      PHYSICAL EXAM:   Vitals:   02/15/19 0932 02/15/19 0951  BP: (!) 141/47   Pulse: 96   Resp: 20   Temp: 97.7 F (36.5 C)   TempSrc: Oral   SpO2: 100%   Weight:  77.1 kg  Height:  5\' 8"  (1.727 m)    GENERAL: The patient is a well-nourished female, in no acute distress. The vital signs are documented above. CARDIOVASCULAR: There is a regular rate and rhythm. PULMONARY: Non-labored respirations   STUDIES:      MEDICAL ISSUES:   We discussed proceeding with elevation and branch ligation of her left arm fistula with branch ligation as well as angioplasty of in-stent stenosis on the left leg.  Renal is requesting catheter exchange.  Leia Alf, MD, FACS [facility] [phone] Pager [phone]

## 2019-02-15 NOTE — Anesthesia Postprocedure Evaluation (Signed)
Anesthesia Post Note  Patient: Kristen Berry  Procedure(s) Performed: SUPERFICIALIZATION WITH BRANCH LIGATION BRACHIOCEPHALIC ARTERIOVENOUS FISTULA LEFT ARM (Left Arm Upper) ANGIOGRAM LEFT LOWER EXTREMITY (Left Groin) EXCHANGE OF A TUNNELED DIALYSIS CATHETER (Right Chest) Ultrasound Guidance For Vascular Access (Right Groin)     Patient location during evaluation: PACU Anesthesia Type: General Level of consciousness: awake and alert Pain management: pain level controlled Vital Signs Assessment: post-procedure vital signs reviewed and stable Respiratory status: spontaneous breathing, nonlabored ventilation and respiratory function stable Cardiovascular status: blood pressure returned to baseline and stable Postop Assessment: no apparent nausea or vomiting Anesthetic complications: no    Last Vitals:  Vitals:   02/15/19 1600 02/15/19 1615  BP: (!) 157/61 (!) 139/56  Pulse: 82 80  Resp: 12 12  Temp:    SpO2: 100% 100%    Last Pain:  Vitals:   02/15/19 1615  TempSrc:   PainSc: [place]

## 2019-02-15 NOTE — Transfer of Care (Signed)
Immediate Anesthesia Transfer of Care Note  Patient: Kristen Berry  Procedure(s) Performed: SUPERFICIALIZATION WITH BRANCH LIGATION BRACHIOCEPHALIC ARTERIOVENOUS FISTULA LEFT ARM (Left Arm Upper) ANGIOGRAM LEFT LOWER EXTREMITY (Left Groin) EXCHANGE OF A TUNNELED DIALYSIS CATHETER (Right Chest) Ultrasound Guidance For Vascular Access (Right Groin)  Patient Location: PACU  Anesthesia Type:General  Level of Consciousness: awake, alert  and oriented  Airway & Oxygen Therapy: Patient Spontanous Breathing and Patient connected to nasal cannula oxygen  Post-op Assessment: Report given to RN, Post -op Vital signs reviewed and stable and Patient moving all extremities X 4  Post vital signs: Reviewed and stable  Last Vitals:  Vitals Value Taken Time  BP 141/48 02/15/2019  3:17 PM  Temp    Pulse 80 02/15/2019  3:19 PM  Resp 12 02/15/2019  3:19 PM  SpO2 100 % 02/15/2019  3:19 PM  Vitals shown include unvalidated device data.  Last Pain:  Vitals:   02/15/19 0949  TempSrc:   PainSc: 0-No pain         Complications: No apparent anesthesia complications

## 2019-02-15 NOTE — Discharge Instructions (Signed)
° °  [facility] [place] ° °Discharge Instructions ° °Lower Extremity Angiogram; Angioplasty/Stenting ° °Please refer to the following instructions for your post-procedure care. Your surgeon or physician assistant will discuss any changes with you. ° °Activity ° °Avoid lifting more than 8 pounds (1 gallons of milk) for 72 hours (3 days) after your procedure. You may walk as much as you can tolerate. It's OK to drive after 72 hours. ° °Bathing/Showering ° °You may shower the day after your procedure. If you have a bandage, you may remove it at 24- 48 hours. Clean your incision site with mild soap and water. Pat the area dry with a clean towel. ° °Diet ° °Resume your pre-procedure diet. There are no special food restrictions following this procedure. All patients with peripheral vascular disease should follow a low fat/low cholesterol diet. In order to heal from your surgery, it is CRITICAL to get adequate nutrition. Your body requires vitamins, minerals, and protein. Vegetables are the best source of vitamins and minerals. Vegetables also provide the perfect balance of protein. Processed food has little nutritional value, so try to avoid this. ° °Medications ° °Resume taking all of your medications unless your doctor tells you not to. If your incision is causing pain, you may take over-the-counter pain relievers such as acetaminophen (Tylenol) ° °Follow Up ° °Follow up will be arranged at the time of your procedure. You may have an office visit scheduled or may be scheduled for surgery. Ask your surgeon if you have any questions. ° °Please call us immediately for any of the following conditions: °•Severe or worsening pain your legs or feet at rest or with walking. °•Increased pain, redness, drainage at your groin puncture site. °•Fever of 101 degrees or higher. °•If you have any mild or slow bleeding from your puncture site: lie down, apply firm constant pressure over the area with a piece of  gauze or a clean wash cloth for 30 minutes- no peeking!, call 911 right away if you are still bleeding after 30 minutes, or if the bleeding is heavy and unmanageable. ° °Reduce your risk factors of vascular disease: ° °Stop smoking. If you would like help call [facility] at 1-800-QUIT-NOW (1-800-784-8669) or [place] at 336-586-4000. °Manage your cholesterol °Maintain a desired weight °Control your diabetes °Keep your blood pressure down ° °If you have any questions, please call the office at [phone] ° °

## 2019-02-15 NOTE — Anesthesia Preprocedure Evaluation (Addendum)
Anesthesia Evaluation  Patient identified by MRN, date of birth, ID band Patient awake    Reviewed: Allergy & Precautions, NPO status , Patient's Chart, lab work & pertinent test results, reviewed documented beta blocker date and time   History of Anesthesia Complications Negative for: history of anesthetic complications  Airway Mallampati: II  TM Distance: >3 FB Neck ROM: Full    Dental  (+) Dental Advisory Given, Chipped   Pulmonary asthma , sleep apnea ,    breath sounds clear to auscultation       Cardiovascular hypertension, Pt. on home beta blockers and Pt. on medications + CAD, + Peripheral Vascular Disease and +CHF  + Valvular Problems/Murmurs MR  Rhythm:Regular Rate:Normal   '19 Cath - Severe diffuse diabetic coronary disease.  Severe diffuse coronary calcification particularly in the left main and LAD territory. 40-50% distal left main. Segmental diffuse ostial to proximal LAD 70-80% stenosis. Unusual circumflex anatomy.  Diffuse distal disease.  No focal high-grade obstruction.  40% obstruction in the mid first obtuse marginal. RCA is dominant.  The continuation of the RCA beyond the PDA contains 75% obstruction.  The mid PDA contains 70% obstruction proximal to bifurcation.  Distal first left ventricular branch contains 70% obstruction. Chronic combined systolic and diastolic heart failure with EF in the 45-50% range.  Severe elevation in LVEDP at 30 mmHg.  '19 TTE - Septal and apical akinesis. LV cavity size was moderately dilated. Mild LVH. EF 35% to 40%. Doppler parameters are consistent with both elevated ventricular end-diastolic filling pressure and elevated left atrial filling pressure. Mild MS, moderate MR. LA was mildly dilated. PA peak pressure: 55 mm Hg  EKG - SR, 1st deg AVB, QTc 460    Neuro/Psych CVA, No Residual Symptoms negative psych ROS   GI/Hepatic negative GI ROS, Neg liver ROS,    Endo/Other  diabetes, Well Controlled, Type 2  Renal/GU ESRF and DialysisRenal disease     Musculoskeletal  (+) Arthritis ,   Abdominal   Peds  Hematology  MGUS    Anesthesia Other Findings   Reproductive/Obstetrics                            Anesthesia Physical Anesthesia Plan  ASA: IV  Anesthesia Plan: General   Post-op Pain Management:    Induction: Intravenous  PONV Risk Score and Plan: 2 and Treatment may vary due to age or medical condition, Ondansetron and Dexamethasone  Airway Management Planned: LMA  Additional Equipment: None  Intra-op Plan:   Post-operative Plan: Extubation in OR  Informed Consent: I have reviewed the patients History and Physical, chart, labs and discussed the procedure including the risks, benefits and alternatives for the proposed anesthesia with the patient or authorized representative who has indicated his/her understanding and acceptance.     Dental advisory given  Plan Discussed with: CRNA and Anesthesiologist  Anesthesia Plan Comments:        Anesthesia Quick Evaluation

## 2019-02-15 NOTE — Anesthesia Procedure Notes (Signed)
Procedure Name: LMA Insertion Date/Time: 02/15/2019 12:41 PM Performed by: Mariea Clonts, CRNA Pre-anesthesia Checklist: Patient identified, Emergency Drugs available, Suction available and Patient being monitored Patient Re-evaluated:Patient Re-evaluated prior to induction Oxygen Delivery Method: Circle System Utilized Preoxygenation: Pre-oxygenation with 100% oxygen Induction Type: IV induction Ventilation: Mask ventilation without difficulty LMA: LMA inserted LMA Size: 4.0 Number of attempts: 1 Airway Equipment and Method: Bite block Placement Confirmation: positive ETCO2 Tube secured with: Tape Dental Injury: Teeth and Oropharynx as per pre-operative assessment

## 2019-02-16 ENCOUNTER — Encounter (HOSPITAL_COMMUNITY): Payer: Self-pay | Admitting: Surgery

## 2019-02-22 ENCOUNTER — Telehealth: Payer: Self-pay | Admitting: Physician Assistant

## 2019-02-22 NOTE — Telephone Encounter (Signed)
[facility] called,patient did not bring her rx that was written on 5/19 appt, please fax to them [phone]

## 2019-02-22 NOTE — Telephone Encounter (Signed)
Thank you , this will be faxed to [place].

## 2019-02-26 ENCOUNTER — Telehealth: Payer: Self-pay | Admitting: *Deleted

## 2019-02-26 ENCOUNTER — Other Ambulatory Visit: Payer: Self-pay | Admitting: *Deleted

## 2019-02-26 DIAGNOSIS — N186 End stage renal disease: Secondary | ICD-10-CM

## 2019-02-26 NOTE — Telephone Encounter (Signed)
  Call to [facility] at [facility] and new arrival time given of 12:45 pm on 03/01/2019 for vascular testing and appt with NP. She will instruct patient.

## 2019-02-26 NOTE — Progress Notes (Signed)
Call from [place] at [facility] requesting patient to be seen this Thursday 03/01/2019 due to decrease bruit left arm A/V fistula s/p revision on 5/22. Patient scheduled to see NP and will have dialysis duplex. Caryl Pina will give appt times to patient.

## 2019-03-01 ENCOUNTER — Other Ambulatory Visit: Payer: Self-pay

## 2019-03-01 ENCOUNTER — Ambulatory Visit (INDEPENDENT_AMBULATORY_CARE_PROVIDER_SITE_OTHER): Payer: Self-pay | Admitting: Family

## 2019-03-01 ENCOUNTER — Encounter: Payer: Self-pay | Admitting: Family

## 2019-03-01 ENCOUNTER — Ambulatory Visit (HOSPITAL_COMMUNITY)
Admission: RE | Admit: 2019-03-01 | Discharge: 2019-03-01 | Disposition: A | Discharge status: Home / Self Care | Payer: Medicare HMO | Source: Ambulatory Visit | Attending: Family | Admitting: Family

## 2019-03-01 VITALS — BP 133/74 | HR 110 | Temp 98.0°F | Resp 12 | Ht 68.0 in | Wt 170.0 lb

## 2019-03-01 DIAGNOSIS — Z992 Dependence on renal dialysis: Secondary | ICD-10-CM

## 2019-03-01 DIAGNOSIS — N186 End stage renal disease: Secondary | ICD-10-CM | POA: Insufficient documentation

## 2019-03-01 DIAGNOSIS — Z89511 Acquired absence of right leg below knee: Secondary | ICD-10-CM

## 2019-03-01 DIAGNOSIS — I739 Peripheral vascular disease, unspecified: Secondary | ICD-10-CM

## 2019-03-01 DIAGNOSIS — I770 Arteriovenous fistula, acquired: Secondary | ICD-10-CM

## 2019-03-01 NOTE — Progress Notes (Signed)
Established Dialysis Access  History of Present Illness  Kristen Berry is a 63 y.o. (Jun 12, 1956) female with multiple medical problems including end-stage renal disease, peripheral vascular disease, acute DVT in 2019 on Coumadin that presents for evaluation of a left arm fistula.  Patient previously had a left brachiocephalic fistula placed by Dr. Donzetta Matters on 11/10/2017.  She was seen by Dr. Donzetta Matters on 06/23/2018 for failure of the fistula to mature and ultimately was recommended that she have a fistulogram and possible conversion to a graft.  Patient never had the fistulogram performed due to transportation issues.  She states she can only come to [place] on Tuesdays due to public transportation from [facility].  She has a right IJ tunnel catheter that she is currently using for dialysis.  Some chronic numbness in the left hand that she states has been present since before fistula was placed. She also states chronic right hand numbness that is equal to left.   On 02-15-19 she underwent:                         #1Elevation and branch ligation of left brachiocephalic fistula                         #2: Removal of right IJ tunneled dialysis catheter                         #3: Placement of a new right internal jugular vein tunneled dialysis catheter                         #4: Ultrasound-guided access, right femoral artery                         #5: Left lower extremity runoff                         #6: Second-order catheterization                         #7: Closure device (Mynx) Angiography results (02-15-19):  The right femoral artery was evaluated ultrasound and found be widely patent.  Was cannulated under ultrasound guidance with a micropuncture needle.  An 018 wire was inserted without resistance and a micropuncture sheath was placed.  A Bentson wire was then inserted the micropuncture was removed and a 5 French sheath was placed.  Using an Omni Flush catheter and the Bentson wire, the  aortic bifurcation was crossed and the cath was placed in the left external iliac artery left lower extremity runoff was performed.  This revealed that the stents within her superficial femoral artery which were previously patent but stenotic had now occluded.  There is reconstitution of above-knee popliteal artery with single-vessel runoff via the anterior tibial artery.  I elected not to intervene.  With there is single-vessel runoff and risk for embolization I felt that if she needed intervention a bypass would be preferable.  Catheter and wire were removed.  The sheath was removed and a minx device was used for closure.  There were no immediate complications.  Dr. Trula Slade performed the above procedures on 02-15-19.   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   She returns today with reported decreased bruit,  [facility].  She dialyzes MWF via right IJ TDC.    Past Medical History:  Diagnosis Date  . Anemia   . Arthritis    "hands, knees" (10/10/2017)  . Asthma   . CHF (congestive heart failure) ([place])   . Chronic kidney disease (CKD), stage IV (severe) ([place])    Dialysis T/ Th/ Sat  . Coronary artery disease   . H/O Clostridium difficile infection   . High cholesterol   . History of kidney stones   . Hypertension   . PVD (peripheral vascular disease) ([place])    "LLE; will have OR" (10/10/2017)  . Sleep apnea    "never given mask" (10/10/2017)  . Stroke [facility])    mild stroke mid April - 2019  . Type II diabetes mellitus ([place])    "no RX anymore" (10/10/2017)    Social History Social History   Tobacco Use  . Smoking status: Never Smoker  . Smokeless tobacco: Never Used  Substance Use Topics  . Alcohol use: No  . Drug use: No    Family History Family History  Problem Relation Age of Onset  . Hypertension Father   . Heart failure Maternal Grandmother   . Heart failure Maternal Grandfather     Surgical History Past Surgical History:  Procedure  Laterality Date  . A/V FISTULAGRAM N/A 12/27/2018   Procedure: A/V FISTULAGRAM - Left Upper;  Surgeon: Serafina Mitchell, MD;  Location: [place] [facility];  Service: Cardiovascular;  Laterality: N/A;  . AMPUTATION Left 11/10/2017   Procedure: AMPUTATION DIGIT SECOND TOE LEFT FOOT;  Surgeon: Waynetta Sandy, MD;  Location: [place];  Service: Vascular;  Laterality: Left;  . AMPUTATION Left 01/24/2018   Procedure: AMPUTATION THIRD TOE LEFT FOOT;  Surgeon: Waynetta Sandy, MD;  Location: [place];  Service: Vascular;  Laterality: Left;  . AMPUTATION Right 12/22/2018   Procedure: Right foot 1st ray amputation;  Surgeon: Newt Minion, MD;  Location: [place];  Service: Orthopedics;  Laterality: Right;  . AMPUTATION Right 01/05/2019   Procedure: RIGHT BELOW KNEE AMPUTATION;  Surgeon: Newt Minion, MD;  Location: [place];  Service: Orthopedics;  Laterality: Right;  . AORTOGRAM N/A 11/03/2017   Procedure: Ultrasound Guided Cannulation Right Common Femoral Artery;  Aortagram with Left Lower Extremity Arteriogram; Attempted Treatment Left Superficial Femoral Artery; Percutaneous Closure Right Common Femoral Arteriotomy with Proglide Device;  Surgeon: Waynetta Sandy, MD;  Location: [place];  Service: Vascular;  Laterality: N/A;  . APPLICATION OF WOUND VAC Right 01/05/2019   Procedure: Application Of Wound Vac;  Surgeon: Newt Minion, MD;  Location: [place];  Service: Orthopedics;  Laterality: Right;  . AV FISTULA PLACEMENT Left 11/10/2017   Procedure: ARTERIOVENOUS (AV) FISTULA CREATION LEFT UPPER ARM;  Surgeon: Waynetta Sandy, MD;  Location: [place];  Service: Vascular;  Laterality: Left;  . EXCHANGE OF A DIALYSIS CATHETER Right 02/15/2019   Procedure: EXCHANGE OF A TUNNELED DIALYSIS CATHETER;  Surgeon: Serafina Mitchell, MD;  Location: [place];  Service: Vascular;  Laterality: Right;  . FISTULA SUPERFICIALIZATION Left 02/15/2019   Procedure: SUPERFICIALIZATION WITH BRANCH LIGATION  BRACHIOCEPHALIC ARTERIOVENOUS FISTULA LEFT ARM;  Surgeon: Serafina Mitchell, MD;  Location: [place];  Service: Vascular;  Laterality: Left;  . HERNIA REPAIR  1950s  . INSERTION OF DIALYSIS CATHETER Right 11/10/2017   Procedure: INSERTION OF TUNNELED DIALYSIS CATHETER RIGHT INTERNAL JUGULAR PLACEMENT;  Surgeon: Waynetta Sandy, MD;  Location: [place];  Service: Vascular;  Laterality:  Right;  . IR FLUORO GUIDE CV LINE RIGHT  10/19/2017  . IR US GUIDE VASC ACCESS RIGHT  10/19/2017  . LEFT HEART CATH AND CORONARY ANGIOGRAPHY N/A 11/01/2017   Procedure: LEFT HEART CATH AND CORONARY ANGIOGRAPHY;  Surgeon: Belva Crome, MD;  Location: [place] [facility];  Service: Cardiovascular;  Laterality: N/A;  . LOWER EXTREMITY ANGIOGRAM Left 02/15/2019   Procedure: ANGIOGRAM LEFT LOWER EXTREMITY;  Surgeon: Serafina Mitchell, MD;  Location: [place];  Service: Vascular;  Laterality: Left;  . LOWER EXTREMITY ANGIOGRAPHY N/A 12/27/2018   Procedure: LOWER EXTREMITY ANGIOGRAPHY - Right Lower;  Surgeon: Serafina Mitchell, MD;  Location: [place] [facility];  Service: Cardiovascular;  Laterality: N/A;  . PERIPHERAL VASCULAR INTERVENTION Left 11/07/2017   Procedure: PERIPHERAL VASCULAR INTERVENTION;  Surgeon: Waynetta Sandy, MD;  Location: [place] [facility];  Service: Cardiovascular;  Laterality: Left;  left SFA  . PERIPHERAL VASCULAR INTERVENTION Right 12/27/2018   Procedure: PERIPHERAL VASCULAR INTERVENTION;  Surgeon: Serafina Mitchell, MD;  Location: [facility] [facility];  Service: Cardiovascular;  Laterality: Right;  SFA  . SHOULDER OPEN ROTATOR CUFF REPAIR Left   . TUBAL LIGATION    . ULTRASOUND GUIDANCE FOR VASCULAR ACCESS  11/01/2017   Procedure: Ultrasound Guidance For Vascular Access;  Surgeon: Belva Crome, MD;  Location: [facility];  Service: Cardiovascular;;  . ULTRASOUND GUIDANCE FOR VASCULAR ACCESS Right 02/15/2019   Procedure: Ultrasound Guidance For Vascular Access;  Surgeon: Serafina Mitchell, MD;   Location: [place];  Service: Vascular;  Laterality: Right;    Allergies  Allergen Reactions  . Crestor [Rosuvastatin Calcium] Other (See Comments)    Leg pain    Current Outpatient Medications  Medication Sig Dispense Refill  . acetaminophen (TYLENOL) 500 MG tablet Take 500 mg by mouth every 8 (eight) hours as needed for mild pain or headache.     . albuterol (PROAIR HFA) 108 (90 Base) MCG/ACT inhaler Inhale 2 puffs into the lungs every 6 (six) hours as needed for wheezing or shortness of breath.    [name] alteplase (CATHFLO ACTIVASE) 2 MG injection 2 mg by Intracatheter route once as needed for open catheter. 1 each   . amLODipine (NORVASC) 5 MG tablet Take 5 mg by mouth every evening.     [name] apixaban (ELIQUIS) 2.5 MG TABS tablet Take 1 tablet (2.5 mg total) by mouth 2 (two) times daily. 60 tablet   . atorvastatin (LIPITOR) 20 MG tablet Take 1 tablet (20 mg total) by mouth daily at 6 PM. 30 tablet 0  . calcitRIOL (ROCALTROL) 0.25 MCG capsule Take 3 capsules (0.75 mcg total) by mouth Every Tuesday,Thursday,and Saturday with dialysis.    [name] calcium acetate (PHOSLO) 667 MG capsule Take 667 mg by mouth 2 (two) times daily with a meal.     . carvedilol (COREG) 25 MG tablet Take 25 mg by mouth 2 (two) times daily.    . clotrimazole-betamethasone (LOTRISONE) cream Apply 1 application topically daily as needed (rash).   0  . diphenoxylate-atropine (LOMOTIL) 2.5-0.025 MG tablet Take 1 tablet by mouth every 12 (twelve) hours as needed for diarrhea or loose stools.     . gabapentin (NEURONTIN) 100 MG capsule Take 1 capsule (100 mg total) by mouth at bedtime.    . hydrALAZINE (APRESOLINE) 25 MG tablet Take 25 mg by mouth 2 (two) times daily.     [name] HYDROcodone-acetaminophen (NORCO) 5-325 MG tablet Take 1 tablet by mouth every 6 (six) hours  as needed for moderate pain. 12 tablet 0  . lidocaine-prilocaine (EMLA) cream Apply 1 application topically daily as needed (dialysis).     . multivitamin (RENA-VIT) TABS  tablet Take 1 tablet by mouth at bedtime.  0  . pantoprazole (PROTONIX) 40 MG tablet Take 40 mg by mouth daily.     . pentafluoroprop-tetrafluoroeth (GEBAUERS) AERO Apply 1 application topically as needed (topical anesthesia for hemodialysis).  0  . pentoxifylline (TRENTAL) 400 MG CR tablet Take 1 tablet (400 mg total) by mouth daily with supper.    . polyethylene glycol (MIRALAX / GLYCOLAX) packet Take 17 g by mouth daily as needed for mild constipation. 14 each 0  . saccharomyces boulardii (FLORASTOR) 250 MG capsule Take 1 capsule (250 mg total) by mouth 2 (two) times daily. 20 capsule 0  . ticagrelor (BRILINTA) 90 MG TABS tablet Take 90 mg by mouth 2 (two) times daily.      No current facility-administered medications for this visit.      REVIEW OF SYSTEMS: see HPI for pertinent positives and negatives    PHYSICAL EXAMINATION:  Vitals:   03/01/19 1354  BP: 133/74  Pulse: (!) 110  Resp: 12  Temp: 98 F (36.7 C)  TempSrc: Oral  SpO2: 100%  Weight: 170 lb (77.1 kg)  Height: 5\' 8"  (1.727 m)   Body mass index is 25.85 kg/m.  General: The patient appears her stated age.   HEENT:  No gross abnormalities Pulmonary: Respirations are non-labored, CTAB Abdomen: Soft and non-tender Musculoskeletal: Well healed right BKA and surgically absent two left toes, well healed Neurologic: No focal weakness or paresthesias are detected, Skin: There are no ulcer or rashes noted. No signs of ischemia in her extremities.  Psychiatric: The patient has normal affect. Cardiovascular: There is a regular rate and rhythm without significant murmur appreciated.  Bilateral radial pulses are palpable.  Palpable thrill and audible bruit at distal portion of left upper arm AVF. Mild swelling with no erythema at incisions, no open wounds, no drainage.   Non-Invasive Vascular Imaging  Left upper arm Access Duplex  (Date: 03/01/2019):  Findings: +--------------------+----------+-----------------+--------+  AVF                 PSV (cm/s)Flow Vol (mL/min)Comments +--------------------+----------+-----------------+--------+ Native artery inflow   283           358                +--------------------+----------+-----------------+--------+ AVF Anastomosis        474                              +--------------------+----------+-----------------+--------+   +------------+----------+-------------+----------+-----------------------------+ OUTFLOW VEINPSV (cm/s)Diameter (cm)Depth (cm)          Describe            +------------+----------+-------------+----------+-----------------------------+ Shoulder       109        0.48        1.17                                 +------------+----------+-------------+----------+-----------------------------+ Prox UA         59        0.61        1.12                                 +------------+----------+-------------+----------+-----------------------------+  Mid UA          78        0.57        1.13                                 +------------+----------+-------------+----------+-----------------------------+ Dist UA        263        0.96        0.12    slight narrowing distal to                                                  mid upper arm / diamater                                                            change             +------------+----------+-------------+----------+-----------------------------+ AC Fossa       704        0.31        0.42                                 +------------+----------+-------------+----------+-----------------------------+ NOTE: Fluid collection in the distal to mid left upper arm measuring 1.71 cms x 0.44 cm. Diameter change of the outflow vein at the anastamosis in the antecubital fossa.   Summary: Patent left Brachial-cephalic fistula with increased velocity at area of slight tortuosity / diamater change in the outflow vein at the antecubital fossa.  Decreased flow noted on proximally.   Medical Decision Making  KERRI-ANNE HAEBERLE is a 63 y.o. female who is s/p elevation and branch ligation of left brachiocephalic fistula, removal of right IJ tunneled dialysis catheter and placement of a new right internal jugular vein tunneled dialysis catheter on 02-15-19 by Dr. Trula Slade.   She is also s./p failure of left arm brachiocephalic fistula to mature initially placed on 10/31/17.    She currently has a right IJ catheter that she is using for dialysis.   Patient uses public transportation from [facility] and Tuesday is the only day she can come to [place].   I discussed with Dr. Oneida Alar pt HPI, physical exam results, and AVF duplex results from today. Pt needs to return about 6 weeks after the 2nd stage BVT, which will be in about 3 weeks, with AVF duplex, see Dr. Carlis Abbott.  I advised her to pump her left hand frequently.    Clemon Chambers, RN, MSN, FNP-C [facility] Office: [phone]  03/01/2019, 2:15 PM  Clinic MD

## 2019-03-15 ENCOUNTER — Ambulatory Visit (INDEPENDENT_AMBULATORY_CARE_PROVIDER_SITE_OTHER): Payer: Medicare HMO | Admitting: Orthopedic Surgery

## 2019-03-15 ENCOUNTER — Other Ambulatory Visit: Payer: Self-pay

## 2019-03-15 DIAGNOSIS — Z89511 Acquired absence of right leg below knee: Secondary | ICD-10-CM

## 2019-03-16 ENCOUNTER — Ambulatory Visit (HOSPITAL_COMMUNITY): Payer: Self-pay

## 2019-03-16 DIAGNOSIS — I739 Peripheral vascular disease, unspecified: Secondary | ICD-10-CM

## 2019-03-22 ENCOUNTER — Encounter: Payer: Self-pay | Admitting: Orthopedic Surgery

## 2019-03-22 NOTE — Progress Notes (Signed)
Office Visit Note   Patient: Kristen Berry           Date of Birth: 04/02/1956           MRN: [id number] Visit Date: 03/15/2019              Requested by: Martinique, Sarah T, MD [place],  [place] [address] PCP: Martinique, Sarah T, MD  Chief Complaint  Patient presents with  . Right Leg - Routine Post Op    01/05/2019 right BKA  [facility]      HPI: Patient is a 63 year old woman who presents 5 weeks status post right transtibial amputation.  Patient states her leg is doing well and she is being fitted for prosthesis today at [facility].  Assessment & Plan: Visit Diagnoses:  1. Acquired absence of right lower extremity below knee [facility])     Plan: Follow-up with [facility] for physical therapy after her prosthetic fitting.  She will continue with her stump shrinker.  Follow-Up Instructions: Return in about 4 weeks (around 04/12/2019).   Ortho Exam  Patient is alert, oriented, no adenopathy, well-dressed, normal affect, normal respiratory effort. Examination the incision is well-healed her leg is well consolidated there is no swelling no cellulitis no signs of infection.  Imaging: No results found. No images are attached to the encounter.  Labs: Lab Results  Component Value Date   HGBA1C 5.5 12/22/2018   HGBA1C 5.5 10/13/2017   HGBA1C 6.6 (H) 06/28/2013   ESRSEDRATE 100 (H) 12/21/2018   ESRSEDRATE 127 (H) 10/27/2017   CRP 18.8 (H) 10/27/2017   REPTSTATUS 01/07/2019 FINAL 01/02/2019   REPTSTATUS 01/07/2019 FINAL 01/02/2019   CULT  01/02/2019    NO GROWTH 5 DAYS Performed at [facility], [place] [address] [place], [place] [address]    CULT  01/02/2019    NO GROWTH 5 DAYS Performed at [facility] [address] [place], [place] [address]      Lab Results  Component Value Date   ALBUMIN 2.7 (L) 01/06/2019   ALBUMIN 2.6 (L) 01/05/2019   ALBUMIN 2.5 (L) 01/04/2019    There is no height or weight on file to calculate BMI.  Orders:  No orders  of the defined types were placed in this encounter.  No orders of the defined types were placed in this encounter.    Procedures: No procedures performed  Clinical Data: No additional findings.  ROS:  All other systems negative, except as noted in the HPI. Review of Systems  Objective: Vital Signs: There were no vitals taken for this visit.  Specialty Comments:  No specialty comments available.  PMFS History: Patient Active Problem List   Diagnosis Date Noted  . Below knee amputation ([place])   . S/P BKA (below knee amputation), right ([place]) 01/05/2019  . ESRD on hemodialysis ([place])   . Coronary artery disease without angina pectoris   . Cellulitis 01/02/2019  . Gangrene of right foot ([place])   . Right foot infection 12/21/2018  . PAD (peripheral artery disease) ([place]) 01/24/2018  . Peripheral artery disease ([place]) 01/24/2018  . C. difficile diarrhea 01/06/2018  . Hypokalemia 01/06/2018  . ESRD (end stage renal disease) ([place]) 01/06/2018  . Prolonged QT interval 01/06/2018  . Hypotension 01/06/2018  . Multiple rib fractures 01/06/2018  . Pressure injury of skin 11/08/2017  . Coronary artery disease due to lipid rich plaque   . Chest pain   . Acute on chronic systolic CHF (congestive heart  failure), NYHA class 3 ([place]) 10/18/2017  . PICC (peripherally inserted central catheter) in place   . Acute osteomyelitis of toe of left foot ([place])   . ARF (acute renal failure) ([place]) 10/10/2017  . AKI (acute kidney injury) ([place]) 10/10/2017  . Essential hypertension 12/14/2016  . Abnormal bone xray 06/15/2016  . MGUS (monoclonal gammopathy of unknown significance) 05/18/2016  . Stage 4 chronic kidney disease ([place]) 05/18/2016  . CHF (congestive heart failure) ([place]) 05/18/2016  . Renal insufficiency 06/28/2013  . Other and unspecified hyperlipidemia 06/28/2013  . Obesity, unspecified 06/28/2013  . Pain in limb 06/28/2013  . Diabetes mellitus type 2, insulin dependent ([place]) 06/28/2013    Past Medical History:  Diagnosis Date  . Anemia   . Arthritis    "hands, knees" (10/10/2017)  . Asthma   . CHF (congestive heart failure) ([place])   . Chronic kidney disease (CKD), stage IV (severe) ([place])    Dialysis T/ Th/ Sat  . Coronary artery disease   . H/O Clostridium difficile infection   . High cholesterol   . History of kidney stones   . Hypertension   . PVD (peripheral vascular disease) ([place])    "LLE; will have OR" (10/10/2017)  . Sleep apnea    "never given mask" (10/10/2017)  . Stroke [facility])    mild stroke mid April - 2019  . Type II diabetes mellitus ([place])    "no RX anymore" (10/10/2017)    Family History  Problem Relation Age of Onset  . Hypertension Father   . Heart failure Maternal Grandmother   . Heart failure Maternal Grandfather     Past Surgical History:  Procedure Laterality Date  . A/V FISTULAGRAM N/A 12/27/2018   Procedure: A/V FISTULAGRAM - Left Upper;  Surgeon: Serafina Mitchell, MD;  Location: [facility];  Service: Cardiovascular;  Laterality: N/A;  . AMPUTATION Left 11/10/2017   Procedure: AMPUTATION DIGIT SECOND TOE LEFT FOOT;  Surgeon: Waynetta Sandy, MD;  Location: [facility];  Service: Vascular;  Laterality: Left;  . AMPUTATION Left 01/24/2018   Procedure: AMPUTATION THIRD TOE LEFT FOOT;  Surgeon: Waynetta Sandy, MD;  Location: [facility];  Service: Vascular;  Laterality: Left;  . AMPUTATION Right 12/22/2018   Procedure: Right foot 1st ray amputation;  Surgeon: Newt Minion, MD;  Location: [facility];  Service: Orthopedics;  Laterality: Right;  . AMPUTATION Right 01/05/2019   Procedure: RIGHT BELOW KNEE AMPUTATION;  Surgeon: Newt Minion, MD;  Location: [facility];  Service: Orthopedics;  Laterality: Right;  . AORTOGRAM N/A 11/03/2017   Procedure: Ultrasound Guided Cannulation Right Common Femoral Artery;  Aortagram with Left Lower Extremity Arteriogram; Attempted Treatment Left Superficial Femoral Artery; Percutaneous Closure Right Common  Femoral Arteriotomy with Proglide Device;  Surgeon: Waynetta Sandy, MD;  Location: [facility];  Service: Vascular;  Laterality: N/A;  . APPLICATION OF WOUND VAC Right 01/05/2019   Procedure: Application Of Wound Vac;  Surgeon: Newt Minion, MD;  Location: [facility];  Service: Orthopedics;  Laterality: Right;  . AV FISTULA PLACEMENT Left 11/10/2017   Procedure: ARTERIOVENOUS (AV) FISTULA CREATION LEFT UPPER ARM;  Surgeon: Waynetta Sandy, MD;  Location: [facility];  Service: Vascular;  Laterality: Left;  . EXCHANGE OF A DIALYSIS CATHETER Right 02/15/2019   Procedure: EXCHANGE OF A TUNNELED DIALYSIS CATHETER;  Surgeon: Serafina Mitchell, MD;  Location: [facility];  Service: Vascular;  Laterality: Right;  . FISTULA SUPERFICIALIZATION Left 02/15/2019   Procedure: SUPERFICIALIZATION WITH BRANCH LIGATION BRACHIOCEPHALIC  ARTERIOVENOUS FISTULA LEFT ARM;  Surgeon: Serafina Mitchell, MD;  Location: [facility];  Service: Vascular;  Laterality: Left;  . HERNIA REPAIR  1950s  . INSERTION OF DIALYSIS CATHETER Right 11/10/2017   Procedure: INSERTION OF TUNNELED DIALYSIS CATHETER RIGHT INTERNAL JUGULAR PLACEMENT;  Surgeon: Waynetta Sandy, MD;  Location: [facility];  Service: Vascular;  Laterality: Right;  . IR FLUORO GUIDE CV LINE RIGHT  10/19/2017  . IR US GUIDE VASC ACCESS RIGHT  10/19/2017  . LEFT HEART CATH AND CORONARY ANGIOGRAPHY N/A 11/01/2017   Procedure: LEFT HEART CATH AND CORONARY ANGIOGRAPHY;  Surgeon: Belva Crome, MD;  Location: [facility];  Service: Cardiovascular;  Laterality: N/A;  . LOWER EXTREMITY ANGIOGRAM Left 02/15/2019   Procedure: ANGIOGRAM LEFT LOWER EXTREMITY;  Surgeon: Serafina Mitchell, MD;  Location: [facility];  Service: Vascular;  Laterality: Left;  . LOWER EXTREMITY ANGIOGRAPHY N/A 12/27/2018   Procedure: LOWER EXTREMITY ANGIOGRAPHY - Right Lower;  Surgeon: Serafina Mitchell, MD;  Location: [facility];  Service: Cardiovascular;  Laterality: N/A;  . PERIPHERAL VASCULAR  INTERVENTION Left 11/07/2017   Procedure: PERIPHERAL VASCULAR INTERVENTION;  Surgeon: Waynetta Sandy, MD;  Location: [facility];  Service: Cardiovascular;  Laterality: Left;  left SFA  . PERIPHERAL VASCULAR INTERVENTION Right 12/27/2018   Procedure: PERIPHERAL VASCULAR INTERVENTION;  Surgeon: Serafina Mitchell, MD;  Location: [facility];  Service: Cardiovascular;  Laterality: Right;  SFA  . SHOULDER OPEN ROTATOR CUFF REPAIR Left   . TUBAL LIGATION    . ULTRASOUND GUIDANCE FOR VASCULAR ACCESS  11/01/2017   Procedure: Ultrasound Guidance For Vascular Access;  Surgeon: Belva Crome, MD;  Location: [facility];  Service: Cardiovascular;;  . ULTRASOUND GUIDANCE FOR VASCULAR ACCESS Right 02/15/2019   Procedure: Ultrasound Guidance For Vascular Access;  Surgeon: Serafina Mitchell, MD;  Location: [facility];  Service: Vascular;  Laterality: Right;   Social History   Occupational History  . Occupation: disabled  Tobacco Use  . Smoking status: Never Smoker  . Smokeless tobacco: Never Used  Substance and Sexual Activity  . Alcohol use: No  . Drug use: No  . Sexual activity: Never

## 2019-03-23 ENCOUNTER — Other Ambulatory Visit: Payer: Self-pay

## 2019-03-23 DIAGNOSIS — Z992 Dependence on renal dialysis: Secondary | ICD-10-CM

## 2019-03-23 DIAGNOSIS — N186 End stage renal disease: Secondary | ICD-10-CM

## 2019-03-27 ENCOUNTER — Ambulatory Visit (HOSPITAL_COMMUNITY)
Admission: RE | Admit: 2019-03-27 | Discharge: 2019-03-27 | Disposition: A | Discharge status: Home / Self Care | Payer: Medicare HMO | Source: Ambulatory Visit | Attending: Family | Admitting: Family

## 2019-03-27 ENCOUNTER — Encounter: Payer: Self-pay | Admitting: Vascular Surgery

## 2019-03-27 ENCOUNTER — Other Ambulatory Visit: Payer: Self-pay

## 2019-03-27 ENCOUNTER — Ambulatory Visit (INDEPENDENT_AMBULATORY_CARE_PROVIDER_SITE_OTHER): Payer: Self-pay | Admitting: Vascular Surgery

## 2019-03-27 VITALS — BP 133/58 | HR 76 | Temp 98.2°F | Resp 14 | Ht 68.0 in | Wt 168.0 lb

## 2019-03-27 DIAGNOSIS — Z992 Dependence on renal dialysis: Secondary | ICD-10-CM

## 2019-03-27 DIAGNOSIS — N186 End stage renal disease: Secondary | ICD-10-CM | POA: Diagnosis not present

## 2019-03-27 NOTE — Progress Notes (Signed)
Patient name: Kristen Berry MRN: [id number] DOB: March 01, 1956 Sex: female  REASON FOR VISIT: Post-op check after elevation of left brachiocephalic fistula  HPI: Kristen Berry is a 63 y.o. female with multiple medical problems that presents for postop check after elevation and branch ligation of left brachiocephalic fistula on 0/93/8182 with Dr. Trula Slade.  She had a fistula duplex prior to her appointment today.  Does report some chronic numbness in her left hand that has been ongoing for years.  Feels her incisions have healed.  No specific concerns.  Using a right IJ catheter in the interim.  Past Medical History:  Diagnosis Date  . Anemia   . Arthritis    "hands, knees" (10/10/2017)  . Asthma   . CHF (congestive heart failure) ([place])   . Chronic kidney disease (CKD), stage IV (severe) ([place])    Dialysis T/ Th/ Sat  . Coronary artery disease   . H/O Clostridium difficile infection   . High cholesterol   . History of kidney stones   . Hypertension   . PVD (peripheral vascular disease) ([place])    "LLE; will have OR" (10/10/2017)  . Sleep apnea    "never given mask" (10/10/2017)  . Stroke [facility])    mild stroke mid April - 2019  . Type II diabetes mellitus ([place])    "no RX anymore" (10/10/2017)    Past Surgical History:  Procedure Laterality Date  . A/V FISTULAGRAM N/A 12/27/2018   Procedure: A/V FISTULAGRAM - Left Upper;  Surgeon: Serafina Mitchell, MD;  Location: [facility];  Service: Cardiovascular;  Laterality: N/A;  . AMPUTATION Left 11/10/2017   Procedure: AMPUTATION DIGIT SECOND TOE LEFT FOOT;  Surgeon: Waynetta Sandy, MD;  Location: [facility];  Service: Vascular;  Laterality: Left;  . AMPUTATION Left 01/24/2018   Procedure: AMPUTATION THIRD TOE LEFT FOOT;  Surgeon: Waynetta Sandy, MD;  Location: [facility];  Service: Vascular;  Laterality: Left;  . AMPUTATION Right 12/22/2018   Procedure: Right foot 1st ray amputation;  Surgeon: Newt Minion, MD;  Location: [facility];   Service: Orthopedics;  Laterality: Right;  . AMPUTATION Right 01/05/2019   Procedure: RIGHT BELOW KNEE AMPUTATION;  Surgeon: Newt Minion, MD;  Location: [facility];  Service: Orthopedics;  Laterality: Right;  . AORTOGRAM N/A 11/03/2017   Procedure: Ultrasound Guided Cannulation Right Common Femoral Artery;  Aortagram with Left Lower Extremity Arteriogram; Attempted Treatment Left Superficial Femoral Artery; Percutaneous Closure Right Common Femoral Arteriotomy with Proglide Device;  Surgeon: Waynetta Sandy, MD;  Location: [facility];  Service: Vascular;  Laterality: N/A;  . APPLICATION OF WOUND VAC Right 01/05/2019   Procedure: Application Of Wound Vac;  Surgeon: Newt Minion, MD;  Location: [facility];  Service: Orthopedics;  Laterality: Right;  . AV FISTULA PLACEMENT Left 11/10/2017   Procedure: ARTERIOVENOUS (AV) FISTULA CREATION LEFT UPPER ARM;  Surgeon: Waynetta Sandy, MD;  Location: [facility];  Service: Vascular;  Laterality: Left;  . EXCHANGE OF A DIALYSIS CATHETER Right 02/15/2019   Procedure: EXCHANGE OF A TUNNELED DIALYSIS CATHETER;  Surgeon: Serafina Mitchell, MD;  Location: [place];  Service: Vascular;  Laterality: Right;  . FISTULA SUPERFICIALIZATION Left 02/15/2019   Procedure: SUPERFICIALIZATION WITH BRANCH LIGATION BRACHIOCEPHALIC ARTERIOVENOUS FISTULA LEFT ARM;  Surgeon: Serafina Mitchell, MD;  Location: [place];  Service: Vascular;  Laterality: Left;  . HERNIA REPAIR  1950s  . INSERTION OF DIALYSIS CATHETER Right 11/10/2017   Procedure: INSERTION OF TUNNELED DIALYSIS CATHETER  RIGHT INTERNAL JUGULAR PLACEMENT;  Surgeon: Waynetta Sandy, MD;  Location: [facility];  Service: Vascular;  Laterality: Right;  . IR FLUORO GUIDE CV LINE RIGHT  10/19/2017  . IR US GUIDE VASC ACCESS RIGHT  10/19/2017  . LEFT HEART CATH AND CORONARY ANGIOGRAPHY N/A 11/01/2017   Procedure: LEFT HEART CATH AND CORONARY ANGIOGRAPHY;  Surgeon: Belva Crome, MD;  Location: [facility];  Service:  Cardiovascular;  Laterality: N/A;  . LOWER EXTREMITY ANGIOGRAM Left 02/15/2019   Procedure: ANGIOGRAM LEFT LOWER EXTREMITY;  Surgeon: Serafina Mitchell, MD;  Location: [facility];  Service: Vascular;  Laterality: Left;  . LOWER EXTREMITY ANGIOGRAPHY N/A 12/27/2018   Procedure: LOWER EXTREMITY ANGIOGRAPHY - Right Lower;  Surgeon: Serafina Mitchell, MD;  Location: [facility];  Service: Cardiovascular;  Laterality: N/A;  . PERIPHERAL VASCULAR INTERVENTION Left 11/07/2017   Procedure: PERIPHERAL VASCULAR INTERVENTION;  Surgeon: Waynetta Sandy, MD;  Location: [facility];  Service: Cardiovascular;  Laterality: Left;  left SFA  . PERIPHERAL VASCULAR INTERVENTION Right 12/27/2018   Procedure: PERIPHERAL VASCULAR INTERVENTION;  Surgeon: Serafina Mitchell, MD;  Location: [facility];  Service: Cardiovascular;  Laterality: Right;  SFA  . SHOULDER OPEN ROTATOR CUFF REPAIR Left   . TUBAL LIGATION    . ULTRASOUND GUIDANCE FOR VASCULAR ACCESS  11/01/2017   Procedure: Ultrasound Guidance For Vascular Access;  Surgeon: Belva Crome, MD;  Location: [facility];  Service: Cardiovascular;;  . ULTRASOUND GUIDANCE FOR VASCULAR ACCESS Right 02/15/2019   Procedure: Ultrasound Guidance For Vascular Access;  Surgeon: Serafina Mitchell, MD;  Location: [facility];  Service: Vascular;  Laterality: Right;    Family History  Problem Relation Age of Onset  . Hypertension Father   . Heart failure Maternal Grandmother   . Heart failure Maternal Grandfather     SOCIAL HISTORY: Social History   Tobacco Use  . Smoking status: Never Smoker  . Smokeless tobacco: Never Used  Substance Use Topics  . Alcohol use: No    Allergies  Allergen Reactions  . Crestor [Rosuvastatin Calcium] Other (See Comments)    Leg pain    Current Outpatient Medications  Medication Sig Dispense Refill  . acetaminophen (TYLENOL) 500 MG tablet Take 500 mg by mouth every 8 (eight) hours as needed for mild pain or  headache.     . albuterol (PROAIR HFA) 108 (90 Base) MCG/ACT inhaler Inhale 2 puffs into the lungs every 6 (six) hours as needed for wheezing or shortness of breath.    [name] alteplase (CATHFLO ACTIVASE) 2 MG injection 2 mg by Intracatheter route once as needed for open catheter. 1 each   . amLODipine (NORVASC) 5 MG tablet Take 5 mg by mouth every evening.     [name] apixaban (ELIQUIS) 2.5 MG TABS tablet Take 1 tablet (2.5 mg total) by mouth 2 (two) times daily. 60 tablet   . atorvastatin (LIPITOR) 20 MG tablet Take 1 tablet (20 mg total) by mouth daily at 6 PM. 30 tablet 0  . calcitRIOL (ROCALTROL) 0.25 MCG capsule Take 3 capsules (0.75 mcg total) by mouth Every Tuesday,Thursday,and Saturday with dialysis.    [name] calcium acetate (PHOSLO) 667 MG capsule Take 667 mg by mouth 2 (two) times daily with a meal.     . carvedilol (COREG) 25 MG tablet Take 25 mg by mouth 2 (two) times daily.    . clotrimazole-betamethasone (LOTRISONE) cream Apply 1 application topically daily as needed (rash).  0  . diphenoxylate-atropine (LOMOTIL) 2.5-0.025 MG tablet Take 1 tablet by mouth every 12 (twelve) hours as needed for diarrhea or loose stools.     . gabapentin (NEURONTIN) 100 MG capsule Take 1 capsule (100 mg total) by mouth at bedtime.    . hydrALAZINE (APRESOLINE) 25 MG tablet Take 25 mg by mouth 2 (two) times daily.     [name] lidocaine-prilocaine (EMLA) cream Apply 1 application topically daily as needed (dialysis).     . multivitamin (RENA-VIT) TABS tablet Take 1 tablet by mouth at bedtime.  0  . pantoprazole (PROTONIX) 40 MG tablet Take 40 mg by mouth daily.     . pentafluoroprop-tetrafluoroeth (GEBAUERS) AERO Apply 1 application topically as needed (topical anesthesia for hemodialysis).  0  . pentoxifylline (TRENTAL) 400 MG CR tablet Take 1 tablet (400 mg total) by mouth daily with supper.    . polyethylene glycol (MIRALAX / GLYCOLAX) packet Take 17 g by mouth daily as needed for mild constipation. 14 each 0  .  saccharomyces boulardii (FLORASTOR) 250 MG capsule Take 1 capsule (250 mg total) by mouth 2 (two) times daily. 20 capsule 0  . ticagrelor (BRILINTA) 90 MG TABS tablet Take 90 mg by mouth 2 (two) times daily.     [name] HYDROcodone-acetaminophen (NORCO) 5-325 MG tablet Take 1 tablet by mouth every 6 (six) hours as needed for moderate pain. (Patient not taking: Reported on 03/27/2019) 12 tablet 0   No current facility-administered medications for this visit.     REVIEW OF SYSTEMS:  [X]  denotes positive finding, [ ]  denotes negative finding Cardiac  Comments:  Chest pain or chest pressure:    Shortness of breath upon exertion:    Short of breath when lying flat:    Irregular heart rhythm:        Vascular    Pain in calf, thigh, or hip brought on by ambulation:    Pain in feet at night that wakes you up from your sleep:     Blood clot in your veins:    Leg swelling:         Pulmonary    Oxygen at home:    Productive cough:     Wheezing:         Neurologic    Sudden weakness in arms or legs:     Sudden numbness in arms or legs:     Sudden onset of difficulty speaking or slurred speech:    Temporary loss of vision in one eye:     Problems with dizziness:         Gastrointestinal    Blood in stool:     Vomited blood:         Genitourinary    Burning when urinating:     Blood in urine:        Psychiatric    Major depression:         Hematologic    Bleeding problems:    Problems with blood clotting too easily:        Skin    Rashes or ulcers:        Constitutional    Fever or chills:      PHYSICAL EXAM: Vitals:   03/27/19 1346  BP: (!) 133/58  Pulse: 76  Resp: 14  Temp: 98.2 F (36.8 C)  TempSrc: Temporal  SpO2: 100%  Weight: 168 lb (76.2 kg)  Height: 5\' 8"  (1.727 m)    GENERAL: The patient is a well-nourished female, in  no acute distress. The vital signs are documented above. CARDIAC: There is a regular rate and rhythm.  VASCULAR:  Left arm brachiocephalic  AVF with good thrill Left radial pulse palpable Left upper arm incisions well healed  DATA:   Fistula duplex shows a nicely dilated left brachiocephalic fistula.  Fistula is little deep in the proximal upper arm toward the shoulder.  Assessment/Plan:  63 year old female status post elevation and branch ligation of left brachiocephalic AV fistula on 01/03/1447 by Dr. Trula Slade.  The fistula appears to be maturing nicely and now has a diameter of over 6 mm throughout its course.  Fistula appears to be appropriate depth through the [facility] fossa up to the mid arm.  Discussed that they could attempt to try and access the fistula starting next week.  Follow-up PRN.   Marty Heck, MD [facility] Office: [phone] Pager: [phone]

## 2019-03-28 ENCOUNTER — Other Ambulatory Visit: Payer: Self-pay

## 2019-03-28 DIAGNOSIS — Z89511 Acquired absence of right leg below knee: Secondary | ICD-10-CM

## 2019-04-19 ENCOUNTER — Ambulatory Visit (INDEPENDENT_AMBULATORY_CARE_PROVIDER_SITE_OTHER): Payer: Medicare HMO | Admitting: Physician Assistant

## 2019-04-19 ENCOUNTER — Encounter: Payer: Self-pay | Admitting: Orthopedic Surgery

## 2019-04-19 VITALS — Ht 68.0 in | Wt 168.0 lb

## 2019-04-19 DIAGNOSIS — Z7901 Long term (current) use of anticoagulants: Secondary | ICD-10-CM

## 2019-04-19 DIAGNOSIS — I739 Peripheral vascular disease, unspecified: Secondary | ICD-10-CM

## 2019-04-19 DIAGNOSIS — E1142 Type 2 diabetes mellitus with diabetic polyneuropathy: Secondary | ICD-10-CM

## 2019-04-19 DIAGNOSIS — Z992 Dependence on renal dialysis: Secondary | ICD-10-CM

## 2019-04-19 DIAGNOSIS — Z89511 Acquired absence of right leg below knee: Secondary | ICD-10-CM

## 2019-04-19 DIAGNOSIS — N186 End stage renal disease: Secondary | ICD-10-CM

## 2019-04-19 NOTE — Progress Notes (Signed)
Office Visit Note   Patient: Kristen Berry           Date of Birth: [birth year]           MRN: [id number] Visit Date: 04/19/2019              Requested by: Martinique, Sarah T, MD [place],  [place] [address] PCP: Martinique, Sarah T, MD  Chief Complaint  Patient presents with  . Right Leg - Routine Post Op    01/05/19 right BKA  Wearing prosthetic in office today.       HPI: The patient is a 63 year old woman who is seen for postoperative follow-up following a right below the knee amputation on 01/05/2019.  She has obtained her prosthetic from [facility].  She is requesting to go to physical therapy closer to her home and [place].  She does feel like she is having some difficulty getting the socket to fit into the prosthetic correctly.  She has a follow-up appointment with [facility] next week.  Assessment & Plan: Visit Diagnoses:  1. Acquired absence of right lower extremity below knee ([place])   2. Type 2 diabetes mellitus with diabetic polyneuropathy, without long-term current use of insulin ([place])   3. PAD (peripheral artery disease) ([place])   4. ESRD on hemodialysis ([place])   5. Chronic anticoagulation     Plan: Orders were written for physical therapy for gait training with her prosthetic strengthening range of motion.  She will follow-up with [facility] concerning possible adjustment of the prosthetic. She will follow-up here in 2 months or sooner should she have difficulties in the interim. Follow-Up Instructions: Return in about 2 months (around 06/20/2019).   Ortho Exam  Patient is alert, oriented, no adenopathy, well-dressed, normal affect, normal respiratory effort. The right transtibial amputation is well-healed.  There is no signs of cellulitis or infection.  She is wearing a shrinker stocking appropriately.  She has full knee extension.  Imaging: No results found.   Labs: Lab Results  Component Value Date   HGBA1C 5.5 12/22/2018   HGBA1C 5.5  10/13/2017   HGBA1C 6.6 (H) 06/28/2013   ESRSEDRATE 100 (H) 12/21/2018   ESRSEDRATE 127 (H) 10/27/2017   CRP 18.8 (H) 10/27/2017   REPTSTATUS 01/07/2019 FINAL 01/02/2019   REPTSTATUS 01/07/2019 FINAL 01/02/2019   CULT  01/02/2019    NO GROWTH 5 DAYS Performed at [facility], [place] [address], [place], [place] [address]    CULT  01/02/2019    NO GROWTH 5 DAYS Performed at [facility] [address] [place], [place] [address]      Lab Results  Component Value Date   ALBUMIN 2.7 (L) 01/06/2019   ALBUMIN 2.6 (L) 01/05/2019   ALBUMIN 2.5 (L) 01/04/2019    Lab Results  Component Value Date   MG 1.8 01/08/2018   MG 1.6 (L) 01/07/2018   MG 1.9 10/12/2017   No results found for: VD25OH  No results found for: PREALBUMIN CBC EXTENDED Latest Ref Rng & Units 02/15/2019 01/10/2019 01/09/2019  WBC 4.0 - 10.5 K/uL - 11.8(H) 13.4(H)  RBC 3.87 - 5.11 MIL/uL - 3.37(L) 3.19(L)  HGB 12.0 - 15.0 g/dL 13.6 10.8(L) 10.1(L)  HCT 36.0 - 46.0 % 40.0 34.4(L) 32.5(L)  PLT 150 - 400 K/uL - 480(H) 514(H)  NEUTROABS 1.7 - 7.7 K/uL - - -  LYMPHSABS 0.7 - 4.0 K/uL - - -     Body mass index is 25.54  kg/m.  Orders:  No orders of the defined types were placed in this encounter.  No orders of the defined types were placed in this encounter.    Procedures: No procedures performed  Clinical Data: No additional findings.  ROS:  All other systems negative, except as noted in the HPI. Review of Systems  Objective: Vital Signs: Ht 5\' 8"  (1.727 m)   Wt 168 lb (76.2 kg)   BMI 25.54 kg/m   Specialty Comments:  No specialty comments available.  PMFS History: Patient Active Problem List   Diagnosis Date Noted  . Below knee amputation ([place])   . S/P BKA (below knee amputation), right ([place]) 01/05/2019  . ESRD on hemodialysis ([place])   . Coronary artery disease without angina pectoris   . Cellulitis 01/02/2019  . Gangrene of right foot ([place])   . Right foot infection 12/21/2018   . PAD (peripheral artery disease) ([place]) 01/24/2018  . Peripheral artery disease ([place]) 01/24/2018  . C. difficile diarrhea 01/06/2018  . Hypokalemia 01/06/2018  . ESRD (end stage renal disease) ([place]) 01/06/2018  . Prolonged QT interval 01/06/2018  . Hypotension 01/06/2018  . Multiple rib fractures 01/06/2018  . Pressure injury of skin 11/08/2017  . Coronary artery disease due to lipid rich plaque   . Chest pain   . Acute on chronic systolic CHF (congestive heart failure), NYHA class 3 ([place]) 10/18/2017  . PICC (peripherally inserted central catheter) in place   . Acute osteomyelitis of toe of left foot ([place])   . ARF (acute renal failure) ([place]) 10/10/2017  . AKI (acute kidney injury) ([place]) 10/10/2017  . Essential hypertension 12/14/2016  . Abnormal bone xray 06/15/2016  . MGUS (monoclonal gammopathy of unknown significance) 05/18/2016  . Stage 4 chronic kidney disease ([place]) 05/18/2016  . CHF (congestive heart failure) ([place]) 05/18/2016  . Renal insufficiency 06/28/2013  . Other and unspecified hyperlipidemia 06/28/2013  . Obesity, unspecified 06/28/2013  . Pain in limb 06/28/2013  . Diabetes mellitus type 2, insulin dependent ([place]) 06/28/2013   Past Medical History:  Diagnosis Date  . Anemia   . Arthritis    "hands, knees" (10/10/2017)  . Asthma   . CHF (congestive heart failure) ([place])   . Chronic kidney disease (CKD), stage IV (severe) ([place])    Dialysis T/ Th/ Sat  . Coronary artery disease   . H/O Clostridium difficile infection   . High cholesterol   . History of kidney stones   . Hypertension   . PVD (peripheral vascular disease) ([place])    "LLE; will have OR" (10/10/2017)  . Sleep apnea    "never given mask" (10/10/2017)  . Stroke [facility])    mild stroke mid April - 2019  . Type II diabetes mellitus ([place])    "no RX anymore" (10/10/2017)    Family History  Problem Relation Age of Onset  . Hypertension Father   . Heart failure Maternal Grandmother   . Heart failure  Maternal Grandfather     Past Surgical History:  Procedure Laterality Date  . A/V FISTULAGRAM N/A 12/27/2018   Procedure: A/V FISTULAGRAM - Left Upper;  Surgeon: Serafina Mitchell, MD;  Location: [facility];  Service: Cardiovascular;  Laterality: N/A;  . AMPUTATION Left 11/10/2017   Procedure: AMPUTATION DIGIT SECOND TOE LEFT FOOT;  Surgeon: Waynetta Sandy, MD;  Location: [facility];  Service: Vascular;  Laterality: Left;  . AMPUTATION Left 01/24/2018   Procedure: AMPUTATION THIRD TOE LEFT FOOT;  Surgeon: Waynetta Sandy, MD;  Location: [facility];  Service: Vascular;  Laterality: Left;  . AMPUTATION Right 12/22/2018   Procedure: Right foot 1st ray amputation;  Surgeon: Newt Minion, MD;  Location: [facility];  Service: Orthopedics;  Laterality: Right;  . AMPUTATION Right 01/05/2019   Procedure: RIGHT BELOW KNEE AMPUTATION;  Surgeon: Newt Minion, MD;  Location: [facility];  Service: Orthopedics;  Laterality: Right;  . AORTOGRAM N/A 11/03/2017   Procedure: Ultrasound Guided Cannulation Right Common Femoral Artery;  Aortagram with Left Lower Extremity Arteriogram; Attempted Treatment Left Superficial Femoral Artery; Percutaneous Closure Right Common Femoral Arteriotomy with Proglide Device;  Surgeon: Waynetta Sandy, MD;  Location: [facility];  Service: Vascular;  Laterality: N/A;  . APPLICATION OF WOUND VAC Right 01/05/2019   Procedure: Application Of Wound Vac;  Surgeon: Newt Minion, MD;  Location: [facility];  Service: Orthopedics;  Laterality: Right;  . AV FISTULA PLACEMENT Left 11/10/2017   Procedure: ARTERIOVENOUS (AV) FISTULA CREATION LEFT UPPER ARM;  Surgeon: Waynetta Sandy, MD;  Location: [facility];  Service: Vascular;  Laterality: Left;  . EXCHANGE OF A DIALYSIS CATHETER Right 02/15/2019   Procedure: EXCHANGE OF A TUNNELED DIALYSIS CATHETER;  Surgeon: Serafina Mitchell, MD;  Location: [facility];  Service: Vascular;  Laterality: Right;  . FISTULA SUPERFICIALIZATION Left 02/15/2019    Procedure: SUPERFICIALIZATION WITH BRANCH LIGATION BRACHIOCEPHALIC ARTERIOVENOUS FISTULA LEFT ARM;  Surgeon: Serafina Mitchell, MD;  Location: [facility];  Service: Vascular;  Laterality: Left;  . HERNIA REPAIR  1950s  . INSERTION OF DIALYSIS CATHETER Right 11/10/2017   Procedure: INSERTION OF TUNNELED DIALYSIS CATHETER RIGHT INTERNAL JUGULAR PLACEMENT;  Surgeon: Waynetta Sandy, MD;  Location: [facility];  Service: Vascular;  Laterality: Right;  . IR FLUORO GUIDE CV LINE RIGHT  10/19/2017  . IR US GUIDE VASC ACCESS RIGHT  10/19/2017  . LEFT HEART CATH AND CORONARY ANGIOGRAPHY N/A 11/01/2017   Procedure: LEFT HEART CATH AND CORONARY ANGIOGRAPHY;  Surgeon: Belva Crome, MD;  Location: [facility];  Service: Cardiovascular;  Laterality: N/A;  . LOWER EXTREMITY ANGIOGRAM Left 02/15/2019   Procedure: ANGIOGRAM LEFT LOWER EXTREMITY;  Surgeon: Serafina Mitchell, MD;  Location: [facility];  Service: Vascular;  Laterality: Left;  . LOWER EXTREMITY ANGIOGRAPHY N/A 12/27/2018   Procedure: LOWER EXTREMITY ANGIOGRAPHY - Right Lower;  Surgeon: Serafina Mitchell, MD;  Location: [facility];  Service: Cardiovascular;  Laterality: N/A;  . PERIPHERAL VASCULAR INTERVENTION Left 11/07/2017   Procedure: PERIPHERAL VASCULAR INTERVENTION;  Surgeon: Waynetta Sandy, MD;  Location: [facility];  Service: Cardiovascular;  Laterality: Left;  left SFA  . PERIPHERAL VASCULAR INTERVENTION Right 12/27/2018   Procedure: PERIPHERAL VASCULAR INTERVENTION;  Surgeon: Serafina Mitchell, MD;  Location: [facility];  Service: Cardiovascular;  Laterality: Right;  SFA  . SHOULDER OPEN ROTATOR CUFF REPAIR Left   . TUBAL LIGATION    . ULTRASOUND GUIDANCE FOR VASCULAR ACCESS  11/01/2017   Procedure: Ultrasound Guidance For Vascular Access;  Surgeon: Belva Crome, MD;  Location: [facility];  Service: Cardiovascular;;  . ULTRASOUND GUIDANCE FOR VASCULAR ACCESS Right 02/15/2019   Procedure: Ultrasound Guidance  For Vascular Access;  Surgeon: Serafina Mitchell, MD;  Location: [facility];  Service: Vascular;  Laterality: Right;   Social History   Occupational History  . Occupation: disabled  Tobacco Use  . Smoking status: Never Smoker  . Smokeless tobacco: Never Used  Substance and Sexual Activity  . Alcohol use: No  .  Drug use: No  . Sexual activity: Never

## 2019-04-30 ENCOUNTER — Telehealth: Payer: Self-pay

## 2019-04-30 NOTE — Telephone Encounter (Signed)
Kristen Berry with [name] would like a copy of physical therapy order, op notes, and postop notes faxed to [phone].  Cb# is [phone].  Please advise.  Thank you.

## 2019-04-30 NOTE — Telephone Encounter (Signed)
All patient's forms faxed to number listed; AttnArbie Cookey

## 2019-05-01 ENCOUNTER — Encounter: Payer: Self-pay | Admitting: *Deleted

## 2019-05-01 ENCOUNTER — Other Ambulatory Visit: Payer: Self-pay | Admitting: *Deleted

## 2019-05-01 NOTE — Progress Notes (Signed)
Confirmed with Caryl Pina at [facility]. Patient is taking Brilinta and Eliquis. Instruction letter faxed to [facility] they will review with patient for 05/08/2019 procedure.

## 2019-05-01 NOTE — Progress Notes (Unsigned)
Dr. Trula Slade approved scheduling  for FISTULOGRAM left brachiocephalic arteriovenous fistula. High arterial pressures and low access flow rate at [facility].

## 2019-05-08 ENCOUNTER — Ambulatory Visit (HOSPITAL_COMMUNITY): Admission: RE | Admit: 2019-05-08 | Payer: Medicare HMO | Source: Home / Self Care | Admitting: Surgery

## 2019-05-08 ENCOUNTER — Encounter (HOSPITAL_COMMUNITY): Admission: RE | Payer: Self-pay | Source: Home / Self Care

## 2019-05-08 SURGERY — A/V FISTULAGRAM
Anesthesia: LOCAL

## 2019-05-10 ENCOUNTER — Telehealth: Payer: Self-pay | Admitting: *Deleted

## 2019-05-10 ENCOUNTER — Other Ambulatory Visit: Payer: Self-pay | Admitting: *Deleted

## 2019-05-10 ENCOUNTER — Encounter: Payer: Self-pay | Admitting: *Deleted

## 2019-05-10 NOTE — Progress Notes (Signed)
Reschedule fistulogram due to "high arterial pressure and low access flow rate"per Caryl Pina at [facility].Spoke with Corey Skains at [facility] to schedule fistulogram. Scheduled for 05/22/2019 to be at [facility] admitting at 7:30 am for same day nasal swab. Asked that  they make every effort possible to follow up call with  patient transportation to ensure pick -up. Pre-procedure instruction letter faxed to [facility].

## 2019-05-10 NOTE — Telephone Encounter (Signed)
Spoke with Corey Skains at [facility] that patient was a no show for Fistulogram on 05-08-2019. Patient states her ride did not show up. To call this office to reschedule.

## 2019-05-22 ENCOUNTER — Ambulatory Visit (HOSPITAL_COMMUNITY)
Admission: RE | Disposition: A | Discharge status: Home / Self Care | Payer: Self-pay | Source: Home / Self Care | Attending: Surgery

## 2019-05-22 ENCOUNTER — Encounter (HOSPITAL_COMMUNITY): Payer: Self-pay | Admitting: Surgery

## 2019-05-22 ENCOUNTER — Other Ambulatory Visit: Payer: Self-pay

## 2019-05-22 ENCOUNTER — Ambulatory Visit (HOSPITAL_COMMUNITY)
Admission: RE | Admit: 2019-05-22 | Discharge: 2019-05-22 | Disposition: A | Discharge status: Home / Self Care | Payer: Medicare HMO | Attending: Surgery | Admitting: Surgery

## 2019-05-22 DIAGNOSIS — N186 End stage renal disease: Secondary | ICD-10-CM | POA: Insufficient documentation

## 2019-05-22 DIAGNOSIS — T82898A Other specified complication of vascular prosthetic devices, implants and grafts, initial encounter: Secondary | ICD-10-CM | POA: Diagnosis not present

## 2019-05-22 DIAGNOSIS — Y841 Kidney dialysis as the cause of abnormal reaction of the patient, or of later complication, without mention of misadventure at the time of the procedure: Secondary | ICD-10-CM | POA: Diagnosis not present

## 2019-05-22 DIAGNOSIS — Z992 Dependence on renal dialysis: Secondary | ICD-10-CM | POA: Diagnosis not present

## 2019-05-22 DIAGNOSIS — T82858A Stenosis of vascular prosthetic devices, implants and grafts, initial encounter: Secondary | ICD-10-CM | POA: Insufficient documentation

## 2019-05-22 DIAGNOSIS — Z1159 Encounter for screening for other viral diseases: Secondary | ICD-10-CM | POA: Diagnosis not present

## 2019-05-22 HISTORY — PX: PERIPHERAL VASCULAR BALLOON ANGIOPLASTY: CATH118281

## 2019-05-22 HISTORY — PX: A/V FISTULAGRAM: CATH118298

## 2019-05-22 LAB — POCT I-STAT, CHEM 8
BUN: 35 mg/dL — ABNORMAL HIGH (ref 8–23)
Calcium, Ion: 1.06 mmol/L — ABNORMAL LOW (ref 1.15–1.40)
Chloride: 100 mmol/L (ref 98–111)
Creatinine, Ser: 3.2 mg/dL — ABNORMAL HIGH (ref 0.44–1.00)
Glucose, Bld: 152 mg/dL — ABNORMAL HIGH (ref 70–99)
HCT: 47 % — ABNORMAL HIGH (ref 36.0–46.0)
Hemoglobin: 16 g/dL — ABNORMAL HIGH (ref 12.0–15.0)
Potassium: 4.4 mmol/L (ref 3.5–5.1)
Sodium: 138 mmol/L (ref 135–145)
TCO2: 28 mmol/L (ref 22–32)

## 2019-05-22 LAB — SARS CORONAVIRUS 2 BY RT PCR (HOSPITAL ORDER, PERFORMED IN ~~LOC~~ HOSPITAL LAB): SARS Coronavirus 2: NEGATIVE

## 2019-05-22 SURGERY — A/V FISTULAGRAM
Anesthesia: LOCAL | Laterality: Left

## 2019-05-22 MED ORDER — MIDAZOLAM HCL 2 MG/2ML IJ SOLN
INTRAMUSCULAR | Status: AC
  Filled 2019-05-22: qty 2

## 2019-05-22 MED ORDER — SODIUM CHLORIDE 0.9% FLUSH: 3.0000 mL | INTRAVENOUS | Status: DC | PRN

## 2019-05-22 MED ORDER — HEPARIN SODIUM (PORCINE) 1000 UNIT/ML IJ SOLN
INTRAMUSCULAR | Status: AC
  Filled 2019-05-22: qty 1

## 2019-05-22 MED ORDER — FENTANYL CITRATE (PF) 100 MCG/2ML IJ SOLN
INTRAMUSCULAR | Status: DC | PRN
  Administered 2019-05-22: 25 ug via INTRAVENOUS

## 2019-05-22 MED ORDER — FENTANYL CITRATE (PF) 100 MCG/2ML IJ SOLN
INTRAMUSCULAR | Status: AC
  Filled 2019-05-22: qty 2

## 2019-05-22 MED ORDER — IODIXANOL 320 MG/ML IV SOLN
INTRAVENOUS | Status: DC | PRN
  Administered 2019-05-22: 60 mL

## 2019-05-22 MED ORDER — HEPARIN (PORCINE) IN NACL 1000-0.9 UT/500ML-% IV SOLN
INTRAVENOUS | Status: AC
  Filled 2019-05-22: qty 500

## 2019-05-22 MED ORDER — HEPARIN (PORCINE) IN NACL 1000-0.9 UT/500ML-% IV SOLN
INTRAVENOUS | Status: DC | PRN
  Administered 2019-05-22: 500 mL

## 2019-05-22 MED ORDER — SODIUM CHLORIDE 0.9 % IV SOLN: 250.0000 mL | INTRAVENOUS | Status: DC | PRN

## 2019-05-22 MED ORDER — MIDAZOLAM HCL 2 MG/2ML IJ SOLN
INTRAMUSCULAR | Status: DC | PRN
  Administered 2019-05-22: 1 mg via INTRAVENOUS

## 2019-05-22 MED ORDER — SODIUM CHLORIDE 0.9% FLUSH: 3.0000 mL | Freq: Two times a day (BID) | INTRAVENOUS | Status: DC

## 2019-05-22 MED ORDER — HEPARIN SODIUM (PORCINE) 1000 UNIT/ML IJ SOLN
INTRAMUSCULAR | Status: DC | PRN
  Administered 2019-05-22: 3000 [IU] via INTRAVENOUS

## 2019-05-22 MED ORDER — LIDOCAINE HCL (PF) 1 % IJ SOLN
INTRAMUSCULAR | Status: AC
  Filled 2019-05-22: qty 30

## 2019-05-22 SURGICAL SUPPLY — 17 items
BAG SNAP BAND KOVER 36X36 (MISCELLANEOUS) ×2 IMPLANT
BALLN STERLING OTW 3X40X150 (BALLOONS) ×2
BALLN STERLING OTW 5X40X135 (BALLOONS) ×2
BALLOON STERLING OTW 3X40X150 (BALLOONS) ×1 IMPLANT
BALLOON STERLING OTW 5X40X135 (BALLOONS) ×1 IMPLANT
COVER DOME SNAP 22 D (MISCELLANEOUS) ×2 IMPLANT
KIT ENCORE 26 ADVANTAGE (KITS) ×2 IMPLANT
KIT MICROPUNCTURE NIT STIFF (SHEATH) ×2 IMPLANT
PROTECTION STATION PRESSURIZED (MISCELLANEOUS) ×2
SHEATH PINNACLE R/O II 5F 6CM (SHEATH) ×2 IMPLANT
SHEATH PROBE COVER 6X72 (BAG) ×2 IMPLANT
STATION PROTECTION PRESSURIZED (MISCELLANEOUS) ×1 IMPLANT
STOPCOCK MORSE 400PSI 3WAY (MISCELLANEOUS) ×2 IMPLANT
TRAY PV CATH (CUSTOM PROCEDURE TRAY) ×2 IMPLANT
TUBING CIL FLEX 10 FLL-RA (TUBING) ×2 IMPLANT
WIRE BENTSON .035X145CM (WIRE) ×2 IMPLANT
WIRE SPARTACORE .014X190CM (WIRE) ×2 IMPLANT

## 2019-05-22 NOTE — H&P (Signed)
   Patient name: Kristen Berry MRN: [id number] DOB: 1956-08-18 Sex: female    HISTORY OF PRESENT ILLNESS:   Kristen Berry is a 63 y.o. female who is having trouble with HD access.  Fistulogram is requested  CURRENT MEDICATIONS:    Current Facility-Administered Medications  Medication Dose Route Frequency Provider Last Rate Last Dose  . 0.9 %  sodium chloride infusion  250 mL Intravenous PRN Serafina Mitchell, MD      . sodium chloride flush (NS) 0.9 % injection 3 mL  3 mL Intravenous Q12H Serafina Mitchell, MD      . sodium chloride flush (NS) 0.9 % injection 3 mL  3 mL Intravenous PRN Serafina Mitchell, MD        REVIEW OF SYSTEMS:   [X]  denotes positive finding, [ ]  denotes negative finding Cardiac  Comments:  Chest pain or chest pressure:    Shortness of breath upon exertion:    Short of breath when lying flat:    Irregular heart rhythm:    Constitutional    Fever or chills:      PHYSICAL EXAM:   Vitals:   05/22/19 0759  BP: (!) 146/80  Pulse: (!) 102  Resp: 16  Temp: (!) 97.2 F (36.2 C)  TempSrc: Skin  SpO2: 100%  Weight: 76.2 kg  Height: 5\' 8"  (1.727 m)    GENERAL: The patient is a well-nourished female, in no acute distress. The vital signs are documented above. CARDIOVASCULAR: There is a regular rate and rhythm. PULMONARY: Non-labored respirations   STUDIES:   none   MEDICAL ISSUES:   plan for fistulogram and possible intervention.  Risks and benefits discussed.  All questions answered  Leia Alf, MD, FACS [facility] [phone] Pager [phone]

## 2019-05-22 NOTE — Op Note (Signed)
    Patient name: MARVELINE PROFETA MRN: [id number] DOB: Dec 12, 1955 Sex: female  05/22/2019 Pre-operative Diagnosis: ESRD Post-operative diagnosis:  Same Surgeon:  Annamarie Major Procedure Performed:  1.  Ultrasound-guided access, left brachiocephalic fistula x2  2.  Fistulogram  3.  Angioplasty, arterial venous anastomosis  4.  Conscious sedation 47 minutes   Indications: The patient is having low flow rates with dialysis.  She comes in today for further evaluation  Procedure:  The patient was identified in the holding area and taken to room 8.  The patient was then placed supine on the table and prepped and draped in the usual sterile fashion.  A time out was called.  Conscious sedation was administered with the use of IV fentanyl and Versed under continuous physician and nurse monitoring.  Heart rate, blood pressure, and oxygen saturations were continuously monitored.  Total sedation time was 47 minutes ultrasound was used to evaluate the fistula.  The vein was patent and compressible.  A digital ultrasound image was acquired.  The fistula was then accessed under ultrasound guidance using a micropuncture needle.  An 018 wire was then asvanced without resistance and a micropuncture sheath was placed.  Contrast injections were then performed through the sheath.  Findings: The central venous system is widely patent.  The cephalic vein fistula is widely patent throughout the upper arm.  There does appear to be a significant caliber change at the arterial venous anastomosis.  There also is a small appearing brachial artery.   Intervention: After the above images were acquired the decision made to proceed with intervention.  I removed the existing sheath and closed the cannulation site with a 4-0 Monocryl.  I then obtained a second ultrasound-guided access into the cephalic vein fistula and towards the arterial venous anastomosis.  I placed a 6 [place] sheath.  I then manipulated a [place] core wire across  the arterial venous anastomosis.  I selected a 3 x 40 [place] balloon and perform balloon angioplasty of the arterial venous anastomosis.  On follow-up imaging this was improved result I elected to upsize to a 5 x 40 [place] balloon and repeated balloon angioplasty of the arterial venous anastomosis.  Subsequent imaging revealed significantly improved perfusion through the fistula.  Residual stenosis was 0.  Catheters and wires were removed.  The cannulation site was closed with suture.  There were no complications  Impression:  #1 narrowing at the arterial venous anastomosis likely secondary to a sclerotic vein rather than a specific stenosis.  The diameter of the vein was in line with artery measuring about 3 mm which would be consistent with a proximal 80 greater than 70% stenosis.  This was down to less than 20% after angioplasty with a 5 mm balloon    V. Annamarie Major, M.D., [facility] [place] Office: [phone] Pager:  [phone]

## 2019-05-22 NOTE — Discharge Instructions (Signed)

## 2019-05-23 MED FILL — Lidocaine HCl Local Preservative Free (PF) Inj 1%: INTRAMUSCULAR | Qty: 30 | Status: AC

## 2019-05-30 ENCOUNTER — Encounter (HOSPITAL_COMMUNITY): Payer: Self-pay | Admitting: Surgery

## 2019-06-13 ENCOUNTER — Telehealth: Payer: Self-pay

## 2019-06-13 NOTE — Telephone Encounter (Signed)
Corey Skains at [facility] called and said that she was wanting to get an appt asap for pt to be seen because she is set to have her [place] taken out on the 22nd and her fistula seems to be clotted today.   Called and advised her after speaking with Jacqlyn Larsen that she needed to call IR first and see if they can get her in. Will call back if they have any issues.   York Cerise, CMA

## 2019-06-15 ENCOUNTER — Other Ambulatory Visit: Payer: Self-pay

## 2019-06-19 ENCOUNTER — Ambulatory Visit (HOSPITAL_COMMUNITY): Admission: RE | Admit: 2019-06-19 | Payer: Medicare HMO | Source: Home / Self Care | Admitting: Surgery

## 2019-06-19 ENCOUNTER — Encounter (HOSPITAL_COMMUNITY): Admission: RE | Payer: Self-pay | Source: Home / Self Care

## 2019-06-19 SURGERY — A/V FISTULAGRAM
Anesthesia: LOCAL | Laterality: Left

## 2019-06-21 ENCOUNTER — Ambulatory Visit: Payer: Medicare HMO | Admitting: Orthopedic Surgery

## 2019-06-25 ENCOUNTER — Inpatient Hospital Stay (HOSPITAL_COMMUNITY)
Admission: EM | Admit: 2019-06-25 | Discharge: 2019-06-28 | DRG: 638 | Disposition: A | Discharge status: Home / Self Care | Payer: Medicare HMO | Source: Ambulatory Visit | Attending: Internal Medicine | Admitting: Internal Medicine

## 2019-06-25 ENCOUNTER — Encounter (HOSPITAL_COMMUNITY): Payer: Self-pay

## 2019-06-25 ENCOUNTER — Emergency Department (HOSPITAL_COMMUNITY): Payer: Medicare HMO

## 2019-06-25 ENCOUNTER — Other Ambulatory Visit: Payer: Self-pay

## 2019-06-25 DIAGNOSIS — E11628 Type 2 diabetes mellitus with other skin complications: Secondary | ICD-10-CM | POA: Diagnosis not present

## 2019-06-25 DIAGNOSIS — E7800 Pure hypercholesterolemia, unspecified: Secondary | ICD-10-CM | POA: Diagnosis present

## 2019-06-25 DIAGNOSIS — Z89511 Acquired absence of right leg below knee: Secondary | ICD-10-CM

## 2019-06-25 DIAGNOSIS — E119 Type 2 diabetes mellitus without complications: Secondary | ICD-10-CM | POA: Diagnosis not present

## 2019-06-25 DIAGNOSIS — A4902 Methicillin resistant Staphylococcus aureus infection, unspecified site: Secondary | ICD-10-CM | POA: Diagnosis present

## 2019-06-25 DIAGNOSIS — E1151 Type 2 diabetes mellitus with diabetic peripheral angiopathy without gangrene: Secondary | ICD-10-CM | POA: Diagnosis present

## 2019-06-25 DIAGNOSIS — Z95820 Peripheral vascular angioplasty status with implants and grafts: Secondary | ICD-10-CM

## 2019-06-25 DIAGNOSIS — N186 End stage renal disease: Secondary | ICD-10-CM | POA: Diagnosis not present

## 2019-06-25 DIAGNOSIS — E8889 Other specified metabolic disorders: Secondary | ICD-10-CM | POA: Diagnosis present

## 2019-06-25 DIAGNOSIS — D631 Anemia in chronic kidney disease: Secondary | ICD-10-CM | POA: Diagnosis present

## 2019-06-25 DIAGNOSIS — I2510 Atherosclerotic heart disease of native coronary artery without angina pectoris: Secondary | ICD-10-CM | POA: Diagnosis present

## 2019-06-25 DIAGNOSIS — I2583 Coronary atherosclerosis due to lipid rich plaque: Secondary | ICD-10-CM | POA: Diagnosis present

## 2019-06-25 DIAGNOSIS — L03116 Cellulitis of left lower limb: Secondary | ICD-10-CM | POA: Diagnosis present

## 2019-06-25 DIAGNOSIS — Z23 Encounter for immunization: Secondary | ICD-10-CM

## 2019-06-25 DIAGNOSIS — Z20828 Contact with and (suspected) exposure to other viral communicable diseases: Secondary | ICD-10-CM | POA: Diagnosis present

## 2019-06-25 DIAGNOSIS — G4730 Sleep apnea, unspecified: Secondary | ICD-10-CM | POA: Diagnosis present

## 2019-06-25 DIAGNOSIS — I5022 Chronic systolic (congestive) heart failure: Secondary | ICD-10-CM | POA: Diagnosis present

## 2019-06-25 DIAGNOSIS — J45909 Unspecified asthma, uncomplicated: Secondary | ICD-10-CM | POA: Diagnosis present

## 2019-06-25 DIAGNOSIS — Z87442 Personal history of urinary calculi: Secondary | ICD-10-CM

## 2019-06-25 DIAGNOSIS — Z86718 Personal history of other venous thrombosis and embolism: Secondary | ICD-10-CM

## 2019-06-25 DIAGNOSIS — N2581 Secondary hyperparathyroidism of renal origin: Secondary | ICD-10-CM | POA: Diagnosis present

## 2019-06-25 DIAGNOSIS — Z992 Dependence on renal dialysis: Secondary | ICD-10-CM

## 2019-06-25 DIAGNOSIS — I132 Hypertensive heart and chronic kidney disease with heart failure and with stage 5 chronic kidney disease, or end stage renal disease: Secondary | ICD-10-CM | POA: Diagnosis present

## 2019-06-25 DIAGNOSIS — I70202 Unspecified atherosclerosis of native arteries of extremities, left leg: Secondary | ICD-10-CM | POA: Diagnosis present

## 2019-06-25 DIAGNOSIS — I10 Essential (primary) hypertension: Secondary | ICD-10-CM | POA: Diagnosis present

## 2019-06-25 DIAGNOSIS — E1122 Type 2 diabetes mellitus with diabetic chronic kidney disease: Secondary | ICD-10-CM | POA: Diagnosis present

## 2019-06-25 DIAGNOSIS — B9562 Methicillin resistant Staphylococcus aureus infection as the cause of diseases classified elsewhere: Secondary | ICD-10-CM | POA: Diagnosis present

## 2019-06-25 DIAGNOSIS — Z8673 Personal history of transient ischemic attack (TIA), and cerebral infarction without residual deficits: Secondary | ICD-10-CM

## 2019-06-25 DIAGNOSIS — Z888 Allergy status to other drugs, medicaments and biological substances status: Secondary | ICD-10-CM

## 2019-06-25 DIAGNOSIS — S88119A Complete traumatic amputation at level between knee and ankle, unspecified lower leg, initial encounter: Secondary | ICD-10-CM | POA: Diagnosis present

## 2019-06-25 DIAGNOSIS — Z8249 Family history of ischemic heart disease and other diseases of the circulatory system: Secondary | ICD-10-CM

## 2019-06-25 DIAGNOSIS — Z89422 Acquired absence of other left toe(s): Secondary | ICD-10-CM

## 2019-06-25 LAB — COMPREHENSIVE METABOLIC PANEL
ALT: 8 U/L (ref 0–44)
AST: 11 U/L — ABNORMAL LOW (ref 15–41)
Albumin: 3.6 g/dL (ref 3.5–5.0)
Alkaline Phosphatase: 147 U/L — ABNORMAL HIGH (ref 38–126)
Anion gap: 16 — ABNORMAL HIGH (ref 5–15)
BUN: 18 mg/dL (ref 8–23)
CO2: 26 mmol/L (ref 22–32)
Calcium: 8.8 mg/dL — ABNORMAL LOW (ref 8.9–10.3)
Chloride: 93 mmol/L — ABNORMAL LOW (ref 98–111)
Creatinine, Ser: 2.26 mg/dL — ABNORMAL HIGH (ref 0.44–1.00)
GFR calc Af Amer: 26 mL/min — ABNORMAL LOW (ref >60)
GFR calc non Af Amer: 22 mL/min — ABNORMAL LOW (ref >60)
Glucose, Bld: 113 mg/dL — ABNORMAL HIGH (ref 70–99)
Potassium: 3.3 mmol/L — ABNORMAL LOW (ref 3.5–5.1)
Sodium: 135 mmol/L (ref 135–145)
Total Bilirubin: 0.9 mg/dL (ref 0.3–1.2)
Total Protein: 8.4 g/dL — ABNORMAL HIGH (ref 6.5–8.1)

## 2019-06-25 LAB — CBC WITH DIFFERENTIAL/PLATELET
Abs Immature Granulocytes: 0.06 10*3/uL (ref 0.00–0.07)
Basophils Absolute: 0.1 10*3/uL (ref 0.0–0.1)
Basophils Relative: 1 %
Eosinophils Absolute: 0.2 10*3/uL (ref 0.0–0.5)
Eosinophils Relative: 1 %
HCT: 46.9 % — ABNORMAL HIGH (ref 36.0–46.0)
Hemoglobin: 15.5 g/dL — ABNORMAL HIGH (ref 12.0–15.0)
Immature Granulocytes: 0 %
Lymphocytes Relative: 10 %
Lymphs Abs: 1.4 10*3/uL (ref 0.7–4.0)
MCH: 31.8 pg (ref 26.0–34.0)
MCHC: 33 g/dL (ref 30.0–36.0)
MCV: 96.3 fL (ref 80.0–100.0)
Monocytes Absolute: 1.1 10*3/uL — ABNORMAL HIGH (ref 0.1–1.0)
Monocytes Relative: 7 %
Neutro Abs: 11.7 10*3/uL — ABNORMAL HIGH (ref 1.7–7.7)
Neutrophils Relative %: 81 %
Platelets: 760 10*3/uL — ABNORMAL HIGH (ref 150–400)
RBC: 4.87 MIL/uL (ref 3.87–5.11)
RDW: 15 % (ref 11.5–15.5)
WBC: 14.5 10*3/uL — ABNORMAL HIGH (ref 4.0–10.5)
nRBC: 0 % (ref 0.0–0.2)

## 2019-06-25 LAB — PROTIME-INR
INR: 1.2 (ref 0.8–1.2)
Prothrombin Time: 15.1 seconds (ref 11.4–15.2)

## 2019-06-25 LAB — LACTIC ACID, PLASMA: Lactic Acid, Venous: 1.2 mmol/L (ref 0.5–1.9)

## 2019-06-25 MED ORDER — ONDANSETRON HCL 4 MG/2ML IJ SOLN: 4.0000 mg | Freq: Four times a day (QID) | INTRAMUSCULAR | Status: DC | PRN

## 2019-06-25 MED ORDER — ONDANSETRON HCL 4 MG PO TABS: 4.0000 mg | ORAL_TABLET | Freq: Four times a day (QID) | ORAL | Status: DC | PRN

## 2019-06-25 MED ORDER — VANCOMYCIN HCL 10 G IV SOLR
2000.0000 mg | Freq: Once | INTRAVENOUS | Status: DC
  Filled 2019-06-25: qty 2000

## 2019-06-25 MED ORDER — PIPERACILLIN-TAZOBACTAM 3.375 G IVPB
3.3750 g | Freq: Two times a day (BID) | INTRAVENOUS | Status: DC
  Filled 2019-06-25: qty 50

## 2019-06-25 MED ORDER — LINEZOLID 600 MG PO TABS
600.0000 mg | ORAL_TABLET | Freq: Two times a day (BID) | ORAL | Status: DC
  Administered 2019-06-26: 600 mg via ORAL
  Filled 2019-06-25 (×2): qty 1

## 2019-06-25 MED ORDER — SODIUM CHLORIDE 0.9 % IV SOLN: 2.0000 g | Freq: Every day | INTRAVENOUS | Status: DC

## 2019-06-25 MED ORDER — CEFTRIAXONE SODIUM 1 G IJ SOLR
1.0000 g | INTRAMUSCULAR | Status: DC
  Administered 2019-06-26: 1 g via INTRAMUSCULAR
  Filled 2019-06-25: qty 10

## 2019-06-25 MED ORDER — HEPARIN SODIUM (PORCINE) 5000 UNIT/ML IJ SOLN: 5000.0000 [IU] | Freq: Three times a day (TID) | INTRAMUSCULAR | Status: DC

## 2019-06-25 MED ORDER — METRONIDAZOLE 500 MG PO TABS: 500.0000 mg | ORAL_TABLET | Freq: Three times a day (TID) | ORAL | Status: DC

## 2019-06-25 MED ORDER — LIDOCAINE HCL (PF) 1 % IJ SOLN
INTRAMUSCULAR | Status: AC
  Administered 2019-06-26: 5 mL
  Filled 2019-06-25: qty 5

## 2019-06-25 MED ORDER — ACETAMINOPHEN 325 MG PO TABS: 650.0000 mg | ORAL_TABLET | Freq: Four times a day (QID) | ORAL | Status: DC | PRN

## 2019-06-25 MED ORDER — ACETAMINOPHEN 650 MG RE SUPP: 650.0000 mg | Freq: Four times a day (QID) | RECTAL | Status: DC | PRN

## 2019-06-25 NOTE — ED Provider Notes (Signed)
[facility] EMERGENCY DEPARTMENT Provider Note   CSN: [id number] Arrival date & time: 06/25/19  1657     History   Chief Complaint Chief Complaint  Patient presents with  . Foot Pain    HPI Kristen Berry is a 63 y.o. female.     HPI Patient presents to the emergency room for evaluation of a possible left foot infection.  Patient states she started noticing some swelling and redness of her foot in the last day or so.  Today she started noticing some pain and discomfort in her foot.  She has felt somewhat chilled possibly feverish but has not measured a temperature.  She was at her dialysis center today when they noticed her foot swelling and redness.  She was instructed to come to the ED.  She denies any trouble with vomiting.  No chest pain or shortness of breath. Past Medical History:  Diagnosis Date  . Anemia   . Arthritis    "hands, knees" (10/10/2017)  . Asthma   . CHF (congestive heart failure) ([place])   . Chronic kidney disease (CKD), stage IV (severe) ([place])    Dialysis T/ Th/ Sat  . Coronary artery disease   . H/O Clostridium difficile infection   . High cholesterol   . History of kidney stones   . Hypertension   . PVD (peripheral vascular disease) ([place])    "LLE; will have OR" (10/10/2017)  . Sleep apnea    "never given mask" (10/10/2017)  . Stroke [facility])    mild stroke mid April - 2019  . Type II diabetes mellitus ([place])    "no RX anymore" (10/10/2017)    Patient Active Problem List   Diagnosis Date Noted  . Below knee amputation ([place])   . S/P BKA (below knee amputation), right ([place]) 01/05/2019  . ESRD on hemodialysis ([place])   . Coronary artery disease without angina pectoris   . Cellulitis 01/02/2019  . Gangrene of right foot ([place])   . Right foot infection 12/21/2018  . PAD (peripheral artery disease) ([place]) 01/24/2018  . Peripheral artery disease ([place]) 01/24/2018  . C. difficile diarrhea 01/06/2018  . Hypokalemia 01/06/2018  . ESRD (end  stage renal disease) ([place]) 01/06/2018  . Prolonged QT interval 01/06/2018  . Hypotension 01/06/2018  . Multiple rib fractures 01/06/2018  . Pressure injury of skin 11/08/2017  . Coronary artery disease due to lipid rich plaque   . Chest pain   . Acute on chronic systolic CHF (congestive heart failure), NYHA class 3 ([place]) 10/18/2017  . PICC (peripherally inserted central catheter) in place   . Acute osteomyelitis of toe of left foot ([place])   . ARF (acute renal failure) ([place]) 10/10/2017  . AKI (acute kidney injury) ([place]) 10/10/2017  . Essential hypertension 12/14/2016  . Abnormal bone xray 06/15/2016  . MGUS (monoclonal gammopathy of unknown significance) 05/18/2016  . Stage 4 chronic kidney disease ([place]) 05/18/2016  . CHF (congestive heart failure) ([place]) 05/18/2016  . Renal insufficiency 06/28/2013  . Other and unspecified hyperlipidemia 06/28/2013  . Obesity, unspecified 06/28/2013  . Pain in limb 06/28/2013  . Diabetes mellitus type 2, insulin dependent ([place]) 06/28/2013    Past Surgical History:  Procedure Laterality Date  . A/V FISTULAGRAM N/A 12/27/2018   Procedure: A/V FISTULAGRAM - Left Upper;  Surgeon: Serafina Mitchell, MD;  Location: [place] CV LAB;  Service: Cardiovascular;  Laterality: N/A;  . A/V FISTULAGRAM Left 05/22/2019   Procedure: A/V FISTULAGRAM;  Surgeon: Harold Barban  W, MD;  Location: [facility];  Service: Cardiovascular;  Laterality: Left;  . AMPUTATION Left 11/10/2017   Procedure: AMPUTATION DIGIT SECOND TOE LEFT FOOT;  Surgeon: Waynetta Sandy, MD;  Location: [facility];  Service: Vascular;  Laterality: Left;  . AMPUTATION Left 01/24/2018   Procedure: AMPUTATION THIRD TOE LEFT FOOT;  Surgeon: Waynetta Sandy, MD;  Location: [facility];  Service: Vascular;  Laterality: Left;  . AMPUTATION Right 12/22/2018   Procedure: Right foot 1st ray amputation;  Surgeon: Newt Minion, MD;  Location: [facility];  Service: Orthopedics;  Laterality: Right;  .  AMPUTATION Right 01/05/2019   Procedure: RIGHT BELOW KNEE AMPUTATION;  Surgeon: Newt Minion, MD;  Location: [place];  Service: Orthopedics;  Laterality: Right;  . AORTOGRAM N/A 11/03/2017   Procedure: Ultrasound Guided Cannulation Right Common Femoral Artery;  Aortagram with Left Lower Extremity Arteriogram; Attempted Treatment Left Superficial Femoral Artery; Percutaneous Closure Right Common Femoral Arteriotomy with Proglide Device;  Surgeon: Waynetta Sandy, MD;  Location: [place];  Service: Vascular;  Laterality: N/A;  . APPLICATION OF WOUND VAC Right 01/05/2019   Procedure: Application Of Wound Vac;  Surgeon: Newt Minion, MD;  Location: [place];  Service: Orthopedics;  Laterality: Right;  . AV FISTULA PLACEMENT Left 11/10/2017   Procedure: ARTERIOVENOUS (AV) FISTULA CREATION LEFT UPPER ARM;  Surgeon: Waynetta Sandy, MD;  Location: [facility];  Service: Vascular;  Laterality: Left;  . EXCHANGE OF A DIALYSIS CATHETER Right 02/15/2019   Procedure: EXCHANGE OF A TUNNELED DIALYSIS CATHETER;  Surgeon: Serafina Mitchell, MD;  Location: [place] [place];  Service: Vascular;  Laterality: Right;  . FISTULA SUPERFICIALIZATION Left 02/15/2019   Procedure: SUPERFICIALIZATION WITH BRANCH LIGATION BRACHIOCEPHALIC ARTERIOVENOUS FISTULA LEFT ARM;  Surgeon: Serafina Mitchell, MD;  Location: [place] [facility];  Service: Vascular;  Laterality: Left;  . HERNIA REPAIR  1950s  . INSERTION OF DIALYSIS CATHETER Right 11/10/2017   Procedure: INSERTION OF TUNNELED DIALYSIS CATHETER RIGHT INTERNAL JUGULAR PLACEMENT;  Surgeon: Waynetta Sandy, MD;  Location: [facility] [place];  Service: Vascular;  Laterality: Right;  . IR FLUORO GUIDE CV LINE RIGHT  10/19/2017  . IR US GUIDE VASC ACCESS RIGHT  10/19/2017  . LEFT HEART CATH AND CORONARY ANGIOGRAPHY N/A 11/01/2017   Procedure: LEFT HEART CATH AND CORONARY ANGIOGRAPHY;  Surgeon: Belva Crome, MD;  Location: [place] CV LAB;  Service: Cardiovascular;  Laterality: N/A;  . LOWER EXTREMITY  ANGIOGRAM Left 02/15/2019   Procedure: ANGIOGRAM LEFT LOWER EXTREMITY;  Surgeon: Serafina Mitchell, MD;  Location: [facility];  Service: Vascular;  Laterality: Left;  . LOWER EXTREMITY ANGIOGRAPHY N/A 12/27/2018   Procedure: LOWER EXTREMITY ANGIOGRAPHY - Right Lower;  Surgeon: Serafina Mitchell, MD;  Location: [place] CV LAB;  Service: Cardiovascular;  Laterality: N/A;  . PERIPHERAL VASCULAR BALLOON ANGIOPLASTY Left 05/22/2019   Procedure: PERIPHERAL VASCULAR BALLOON ANGIOPLASTY;  Surgeon: Serafina Mitchell, MD;  Location: [place] CV LAB;  Service: Cardiovascular;  Laterality: Left;  fistula  . PERIPHERAL VASCULAR INTERVENTION Left 11/07/2017   Procedure: PERIPHERAL VASCULAR INTERVENTION;  Surgeon: Waynetta Sandy, MD;  Location: [place] CV [facility];  Service: Cardiovascular;  Laterality: Left;  left SFA  . PERIPHERAL VASCULAR INTERVENTION Right 12/27/2018   Procedure: PERIPHERAL VASCULAR INTERVENTION;  Surgeon: Serafina Mitchell, MD;  Location: [facility];  Service: Cardiovascular;  Laterality: Right;  SFA  . SHOULDER OPEN ROTATOR CUFF REPAIR Left   . TUBAL LIGATION    . ULTRASOUND  GUIDANCE FOR VASCULAR ACCESS  11/01/2017   Procedure: Ultrasound Guidance For Vascular Access;  Surgeon: Belva Crome, MD;  Location: [place] CV LAB;  Service: Cardiovascular;;  . ULTRASOUND GUIDANCE FOR VASCULAR ACCESS Right 02/15/2019   Procedure: Ultrasound Guidance For Vascular Access;  Surgeon: Serafina Mitchell, MD;  Location: [facility];  Service: Vascular;  Laterality: Right;     OB History   No obstetric history on file.      Home Medications    Prior to Admission medications   Medication Sig Start Date End Date Taking? Authorizing Provider  acetaminophen (TYLENOL) 500 MG tablet Take 500 mg by mouth every 8 (eight) hours as needed for mild pain or headache.     [provider]  albuterol (PROAIR HFA) 108 (90 Base) MCG/ACT inhaler Inhale 2 puffs into the lungs every 6 (six) hours as  needed for wheezing or shortness of breath.    [provider]  alteplase (CATHFLO ACTIVASE) 2 MG injection 2 mg by Intracatheter route once as needed for open catheter. 12/30/18   Bonnita Hollow, MD  amLODipine (NORVASC) 5 MG tablet Take 5 mg by mouth every evening.     [provider]  apixaban (ELIQUIS) 2.5 MG TABS tablet Take 1 tablet (2.5 mg total) by mouth 2 (two) times daily. 12/30/18   Bonnita Hollow, MD  atorvastatin (LIPITOR) 20 MG tablet Take 1 tablet (20 mg total) by mouth daily at 6 PM. 11/16/17   Domenic Polite, MD  calcitRIOL (ROCALTROL) 0.25 MCG capsule Take 3 capsules (0.75 mcg total) by mouth Every Tuesday,Thursday,and Saturday with dialysis. 12/30/18   Bonnita Hollow, MD  calcium acetate (PHOSLO) 667 MG capsule Take 667 mg by mouth 2 (two) times daily with a meal.  10/27/18   [provider]  carvedilol (COREG) 25 MG tablet Take 25 mg by mouth 2 (two) times daily. 08/26/18   [provider]  clotrimazole-betamethasone (LOTRISONE) cream Apply 1 application topically daily as needed (rash).  01/19/18   [provider]  diphenoxylate-atropine (LOMOTIL) 2.5-0.025 MG tablet Take 1 tablet by mouth every 12 (twelve) hours as needed for diarrhea or loose stools.     [provider]  gabapentin (NEURONTIN) 100 MG capsule Take 1 capsule (100 mg total) by mouth at bedtime. Patient not taking: Reported on 05/22/2019 12/30/18   Bonnita Hollow, MD  hydrALAZINE (APRESOLINE) 25 MG tablet Take 25 mg by mouth 2 (two) times daily.  01/25/18   Rhyne, Hulen Shouts, PA-C  HYDROcodone-acetaminophen (NORCO) 5-325 MG tablet Take 1 tablet by mouth every 6 (six) hours as needed for moderate pain. 02/15/19   Dagoberto Ligas, PA-C  lidocaine-prilocaine (EMLA) cream Apply 1 application topically daily as needed (dialysis).  09/28/18   [provider]  multivitamin (RENA-VIT) TABS tablet Take 1 tablet by mouth at bedtime. Patient not taking: Reported on  05/22/2019 12/30/18   Bonnita Hollow, MD  pantoprazole (PROTONIX) 40 MG tablet Take 40 mg by mouth daily.     [provider]  pentafluoroprop-tetrafluoroeth Landry Dyke) AERO Apply 1 application topically as needed (topical anesthesia for hemodialysis). 12/30/18   Bonnita Hollow, MD  pentoxifylline (TRENTAL) 400 MG CR tablet Take 1 tablet (400 mg total) by mouth daily with supper. Patient not taking: Reported on 05/22/2019 12/30/18   Bonnita Hollow, MD  polyethylene glycol [facility] / Floria Raveling) packet Take 17 g by mouth daily as needed for mild constipation. 12/30/18   Bonnita Hollow, MD  saccharomyces  boulardii (FLORASTOR) 250 MG capsule Take 1 capsule (250 mg total) by mouth 2 (two) times daily. 01/12/18   Caren Griffins, MD  ticagrelor (BRILINTA) 90 MG TABS tablet Take 90 mg by mouth 2 (two) times daily.     [provider]    Family History Family History  Problem Relation Age of Onset  . Hypertension Father   . Heart failure Maternal Grandmother   . Heart failure Maternal Grandfather     Social History Social History   Tobacco Use  . Smoking status: Never Smoker  . Smokeless tobacco: Never Used  Substance Use Topics  . Alcohol use: No  . Drug use: No     Allergies   Crestor [rosuvastatin calcium]   Review of Systems Review of Systems  All other systems reviewed and are negative.    Physical Exam Updated Vital Signs BP (!) 120/58 (BP Location: Right Arm)   Pulse (!) 104   Temp 98.3 F (36.8 C) (Oral)   Resp 16   SpO2 100%   Physical Exam Vitals signs and nursing note reviewed.  Constitutional:      General: She is not in acute distress.    Appearance: She is well-developed.  HENT:     Head: Normocephalic and atraumatic.     Right Ear: External ear normal.     Left Ear: External ear normal.  Eyes:     General: No scleral icterus.       Right eye: No discharge.        Left eye: No discharge.     Conjunctiva/sclera: Conjunctivae  normal.  Neck:     Musculoskeletal: Neck supple.     Trachea: No tracheal deviation.  Cardiovascular:     Rate and Rhythm: Normal rate and regular rhythm.  Pulmonary:     Effort: Pulmonary effort is normal. No respiratory distress.     Breath sounds: Normal breath sounds. No stridor. No wheezing or rales.  Abdominal:     General: Bowel sounds are normal. There is no distension.     Palpations: Abdomen is soft.     Tenderness: There is no abdominal tenderness. There is no guarding or rebound.  Musculoskeletal:        General: Swelling and tenderness present.     Comments: Erythema of the dorsal surface of the left foot, erythema of the great toe with tenderness, no fluctuance, no drainage, no ulceration or necrosis  Skin:    General: Skin is warm and dry.     Findings: No rash.  Neurological:     Mental Status: She is alert.     Cranial Nerves: No cranial nerve deficit (no facial droop, extraocular movements intact, no slurred speech).     Sensory: No sensory deficit.     Motor: No abnormal muscle tone or seizure activity.     Coordination: Coordination normal.      ED Treatments / Results  Labs (all labs ordered are listed, but only abnormal results are displayed) Labs Reviewed  COMPREHENSIVE METABOLIC PANEL - Abnormal; Notable for the following components:      Result Value   Potassium 3.3 (*)    Chloride 93 (*)    Glucose, Bld 113 (*)    Creatinine, Ser 2.26 (*)    Calcium 8.8 (*)    Total Protein 8.4 (*)    AST 11 (*)    Alkaline Phosphatase 147 (*)    GFR calc non Af Amer 22 (*)    GFR  calc Af Amer 26 (*)    Anion gap 16 (*)    All other components within normal limits  CBC WITH DIFFERENTIAL/PLATELET - Abnormal; Notable for the following components:   WBC 14.5 (*)    Hemoglobin 15.5 (*)    HCT 46.9 (*)    Platelets 760 (*)    Neutro Abs 11.7 (*)    Monocytes Absolute 1.1 (*)    All other components within normal limits  CULTURE, BLOOD (ROUTINE X 2)   CULTURE, BLOOD (ROUTINE X 2)  LACTIC ACID, PLASMA  PROTIME-INR  LACTIC ACID, PLASMA     Radiology Dg Foot Complete Left  Result Date: 06/25/2019 CLINICAL DATA:  Left foot pain/infection. Left second and third toe amputations. EXAM: LEFT FOOT - COMPLETE 3+ VIEW COMPARISON:  Foot radiograph June 27, 2011; 09/29/2017 FINDINGS: Flexion at the first IP joint. Patient status post distal amputation of the second and third digits. Proximal most aspect of the proximal phalanx of the second digit and a portion of the proximal phalanx of the third digit remain. No evidence for acute fracture or dislocation. Midfoot degenerative changes. Posterior and plantar calcaneal spurring. Soft tissue swelling about the foot. Vascular calcifications. IMPRESSION: Patient status post amputation of the majority of the second and third digits as above. No evidence for acute osseous abnormality. Electronically Signed   By: Lovey Newcomer M.D.   On: 06/25/2019 18:13    Procedures Procedures (including critical care time)  Medications Ordered in ED Medications - No data to display   Initial Impression / Assessment and Plan / ED Course  I have reviewed the triage vital signs and the nursing notes.  Pertinent labs & imaging results that were available during my care of the patient were reviewed by me and considered in my medical decision making (see chart for details).      Patient's exam consistent with cellulitis.  She has leukocytosis but is afebrile and has a normal lactic acid level.  Signs of sepsis.  Electrolytes are consistent with her chronic kidney disease.  X-ray does not show any plain film evidence of osteomyelitis.  Considering her chronic kidney disease, history of peripheral vascular disease and diabetes plan on IV antibiotics, and further monitoring.  Consult for admission  Final Clinical Impressions(s) / ED Diagnoses   Final diagnoses:  Cellulitis of left lower extremity      Dorie Rank, MD  06/25/19 2242

## 2019-06-25 NOTE — H&P (Addendum)
History and Physical    Kristen Berry ZMO:[id number] DOB: 04/12/1956 DOA: 06/25/2019  PCP: [place], Sarah T, MD  Patient coming from: Home  I have personally briefly reviewed patient's old medical records in [place]  Chief Complaint: Foot pain  HPI: Kristen Berry is a 63 y.o. female with medical history significant of DM2, HTN, ESRD dialysis MWF just had today before coming to ED.  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  Patient has been having redness and swelling in her foot for the past day or so.  Primarily on the dorsal surface of great toe.  Today she started noticing some pain and discomfort in her foot.  She has felt somewhat chilled possibly feverish but has not measured a temperature.  She was at her dialysis center today when they noticed her foot swelling and redness.  She was instructed to come to the ED.   ED Course: WBC 15k.  EDP reqs admission for ABx given h/o amputations and WBC elevation.  Unable to get peripheral IV or L EJ.  Currently doing dialysis through a RIJ tunneled catheter.   Review of Systems: As per HPI, otherwise all review of systems negative.  Past Medical History:  Diagnosis Date  . Anemia   . Arthritis    "hands, knees" (10/10/2017)  . Asthma   . CHF (congestive heart failure) ([place])   . Chronic kidney disease (CKD), stage IV (severe) ([place])    Dialysis T/ Th/ Sat  . Coronary artery disease   . H/O Clostridium difficile infection   . High cholesterol   . History of kidney stones   . Hypertension   . PVD (peripheral vascular disease) ([place])    "LLE; will have OR" (10/10/2017)  . Sleep apnea    "never given mask" (10/10/2017)  . Stroke [facility])    mild stroke mid April - 2019  . Type II diabetes mellitus ([place])    "no RX anymore" (10/10/2017)    Past Surgical History:  Procedure Laterality Date  . A/V FISTULAGRAM N/A 12/27/2018   Procedure: A/V FISTULAGRAM - Left Upper;  Surgeon: Serafina Mitchell, MD;  Location: [place] [facility];  Service: Cardiovascular;  Laterality: N/A;  . A/V FISTULAGRAM Left 05/22/2019   Procedure: A/V FISTULAGRAM;  Surgeon: Serafina Mitchell, MD;  Location: [place] [facility];  Service: Cardiovascular;  Laterality: Left;  . AMPUTATION Left 11/10/2017   Procedure: AMPUTATION DIGIT SECOND TOE LEFT FOOT;  Surgeon: Waynetta Sandy, MD;  Location: [place];  Service: Vascular;  Laterality: Left;  . AMPUTATION Left 01/24/2018   Procedure: AMPUTATION THIRD TOE LEFT FOOT;  Surgeon: Waynetta Sandy, MD;  Location: [place];  Service: Vascular;  Laterality: Left;  . AMPUTATION Right 12/22/2018   Procedure: Right foot 1st ray amputation;  Surgeon: Newt Minion, MD;  Location: [place];  Service: Orthopedics;  Laterality: Right;  . AMPUTATION Right 01/05/2019   Procedure: RIGHT BELOW KNEE AMPUTATION;  Surgeon: Newt Minion, MD;  Location: [place];  Service: Orthopedics;  Laterality: Right;  . AORTOGRAM N/A 11/03/2017   Procedure: Ultrasound Guided Cannulation Right Common Femoral Artery;  Aortagram with Left Lower Extremity Arteriogram; Attempted Treatment Left Superficial Femoral Artery; Percutaneous Closure Right Common Femoral Arteriotomy with Proglide Device;  Surgeon: Waynetta Sandy, MD;  Location: [place];  Service: Vascular;  Laterality: N/A;  . APPLICATION OF WOUND VAC Right 01/05/2019   Procedure: Application Of Wound Vac;  Surgeon: Sharol Given,  Illene Regulus, MD;  Location: [place];  Service: Orthopedics;  Laterality: Right;  . AV FISTULA PLACEMENT Left 11/10/2017   Procedure: ARTERIOVENOUS (AV) FISTULA CREATION LEFT UPPER ARM;  Surgeon: Waynetta Sandy, MD;  Location: [place];  Service: Vascular;  Laterality: Left;  . EXCHANGE OF A DIALYSIS CATHETER Right 02/15/2019   Procedure: EXCHANGE OF A TUNNELED DIALYSIS CATHETER;  Surgeon: Serafina Mitchell, MD;  Location: [place];  Service: Vascular;  Laterality: Right;  . FISTULA SUPERFICIALIZATION Left 02/15/2019   Procedure: SUPERFICIALIZATION WITH  BRANCH LIGATION BRACHIOCEPHALIC ARTERIOVENOUS FISTULA LEFT ARM;  Surgeon: Serafina Mitchell, MD;  Location: [place];  Service: Vascular;  Laterality: Left;  . HERNIA REPAIR  1950s  . INSERTION OF DIALYSIS CATHETER Right 11/10/2017   Procedure: INSERTION OF TUNNELED DIALYSIS CATHETER RIGHT INTERNAL JUGULAR PLACEMENT;  Surgeon: Waynetta Sandy, MD;  Location: [place];  Service: Vascular;  Laterality: Right;  . IR FLUORO GUIDE CV LINE RIGHT  10/19/2017  . IR US GUIDE VASC ACCESS RIGHT  10/19/2017  . LEFT HEART CATH AND CORONARY ANGIOGRAPHY N/A 11/01/2017   Procedure: LEFT HEART CATH AND CORONARY ANGIOGRAPHY;  Surgeon: Belva Crome, MD;  Location: [place] [facility];  Service: Cardiovascular;  Laterality: N/A;  . LOWER EXTREMITY ANGIOGRAM Left 02/15/2019   Procedure: ANGIOGRAM LEFT LOWER EXTREMITY;  Surgeon: Serafina Mitchell, MD;  Location: [place];  Service: Vascular;  Laterality: Left;  . LOWER EXTREMITY ANGIOGRAPHY N/A 12/27/2018   Procedure: LOWER EXTREMITY ANGIOGRAPHY - Right Lower;  Surgeon: Serafina Mitchell, MD;  Location: [place] [facility];  Service: Cardiovascular;  Laterality: N/A;  . PERIPHERAL VASCULAR BALLOON ANGIOPLASTY Left 05/22/2019   Procedure: PERIPHERAL VASCULAR BALLOON ANGIOPLASTY;  Surgeon: Serafina Mitchell, MD;  Location: [place] [facility];  Service: Cardiovascular;  Laterality: Left;  fistula  . PERIPHERAL VASCULAR INTERVENTION Left 11/07/2017   Procedure: PERIPHERAL VASCULAR INTERVENTION;  Surgeon: Waynetta Sandy, MD;  Location: [place] [facility];  Service: Cardiovascular;  Laterality: Left;  left SFA  . PERIPHERAL VASCULAR INTERVENTION Right 12/27/2018   Procedure: PERIPHERAL VASCULAR INTERVENTION;  Surgeon: Serafina Mitchell, MD;  Location: [place] [facility];  Service: Cardiovascular;  Laterality: Right;  SFA  . SHOULDER OPEN ROTATOR CUFF REPAIR Left   . TUBAL LIGATION    . ULTRASOUND GUIDANCE FOR VASCULAR ACCESS  11/01/2017   Procedure: Ultrasound Guidance For  Vascular Access;  Surgeon: Belva Crome, MD;  Location: [place] [facility];  Service: Cardiovascular;;  . ULTRASOUND GUIDANCE FOR VASCULAR ACCESS Right 02/15/2019   Procedure: Ultrasound Guidance For Vascular Access;  Surgeon: Serafina Mitchell, MD;  [facility];  Service: Vascular;  Laterality: Right;     reports that she has never smoked. She has never used smokeless tobacco. She reports that she does not drink alcohol or use drugs.  Allergies  Allergen Reactions  . Crestor [Rosuvastatin Calcium] Other (See Comments)    Leg pain    Family History  Problem Relation Age of Onset  . Hypertension Father   . Heart failure Maternal Grandmother   . Heart failure Maternal Grandfather      Prior to Admission medications   Medication Sig Start Date End Date Taking? Authorizing Provider  acetaminophen (TYLENOL) 500 MG tablet Take 500 mg by mouth every 8 (eight) hours as needed for mild pain or headache.     [provider]  albuterol (PROAIR HFA) 108 (90 Base) MCG/ACT inhaler Inhale 2 puffs into the lungs every  6 (six) hours as needed for wheezing or shortness of breath.    [provider]  alteplase (CATHFLO ACTIVASE) 2 MG injection 2 mg by Intracatheter route once as needed for open catheter. 12/30/18   Bonnita Hollow, MD  amLODipine (NORVASC) 5 MG tablet Take 5 mg by mouth every evening.     [provider]  apixaban (ELIQUIS) 2.5 MG TABS tablet Take 1 tablet (2.5 mg total) by mouth 2 (two) times daily. 12/30/18   Bonnita Hollow, MD  atorvastatin (LIPITOR) 20 MG tablet Take 1 tablet (20 mg total) by mouth daily at 6 PM. 11/16/17   Domenic Polite, MD  calcitRIOL (ROCALTROL) 0.25 MCG capsule Take 3 capsules (0.75 mcg total) by mouth Every Tuesday,Thursday,and Saturday with dialysis. 12/30/18   Bonnita Hollow, MD  calcium acetate (PHOSLO) 667 MG capsule Take 667 mg by mouth 2 (two) times daily with a meal.  10/27/18   [provider]  carvedilol (COREG)  25 MG tablet Take 25 mg by mouth 2 (two) times daily. 08/26/18   [provider]  clotrimazole-betamethasone (LOTRISONE) cream Apply 1 application topically daily as needed (rash).  01/19/18   [provider]  diphenoxylate-atropine (LOMOTIL) 2.5-0.025 MG tablet Take 1 tablet by mouth every 12 (twelve) hours as needed for diarrhea or loose stools.     [provider]  gabapentin (NEURONTIN) 100 MG capsule Take 1 capsule (100 mg total) by mouth at bedtime. Patient not taking: Reported on 05/22/2019 12/30/18   Bonnita Hollow, MD  hydrALAZINE (APRESOLINE) 25 MG tablet Take 25 mg by mouth 2 (two) times daily.  01/25/18   Rhyne, Hulen Shouts, PA-C  HYDROcodone-acetaminophen (NORCO) 5-325 MG tablet Take 1 tablet by mouth every 6 (six) hours as needed for moderate pain. 02/15/19   Dagoberto Ligas, PA-C  lidocaine-prilocaine (EMLA) cream Apply 1 application topically daily as needed (dialysis).  09/28/18   [provider]  multivitamin (RENA-VIT) TABS tablet Take 1 tablet by mouth at bedtime. Patient not taking: Reported on 05/22/2019 12/30/18   Bonnita Hollow, MD  pantoprazole (PROTONIX) 40 MG tablet Take 40 mg by mouth daily.     [provider]  pentafluoroprop-tetrafluoroeth Landry Dyke) AERO Apply 1 application topically as needed (topical anesthesia for hemodialysis). 12/30/18   Bonnita Hollow, MD  pentoxifylline (TRENTAL) 400 MG CR tablet Take 1 tablet (400 mg total) by mouth daily with supper. Patient not taking: Reported on 05/22/2019 12/30/18   Bonnita Hollow, MD  polyethylene glycol [facility] Floria Raveling) packet Take 17 g by mouth daily as needed for mild constipation. 12/30/18   Bonnita Hollow, MD  saccharomyces boulardii (FLORASTOR) 250 MG capsule Take 1 capsule (250 mg total) by mouth 2 (two) times daily. 01/12/18   Caren Griffins, MD  ticagrelor (BRILINTA) 90 MG TABS tablet Take 90 mg by mouth 2 (two) times daily.     [provider]     Physical Exam: Vitals:   06/25/19 1705 06/25/19 1923 06/25/19 2058 06/25/19 2215  BP: 124/63 (!) 114/56 (!) 120/58 124/62  Pulse: (!) 105 (!) 104 (!) 104   Resp: 16 18 16    Temp: 97.9 F (36.6 C) 98.3 F (36.8 C)    TempSrc: Oral Oral    SpO2: 98% 100% 100%     Constitutional: NAD, calm, comfortable Eyes: PERRL, lids and conjunctivae normal ENMT: Mucous membranes are moist. Posterior pharynx clear of any exudate or lesions.Normal dentition.  Neck: normal, supple, no masses, no thyromegaly  Respiratory: clear to auscultation bilaterally, no wheezing, no crackles. Normal respiratory effort. No accessory muscle use.  Cardiovascular: Regular rate and rhythm, no murmurs / rubs / gallops. No extremity edema. 2+ pedal pulses. No carotid bruits.  Abdomen: no tenderness, no masses palpated. No hepatosplenomegaly. Bowel sounds positive.  Musculoskeletal: s/p R BKA.  L foot with 2nd and 3rd toe amputations.  R great toe dorsal surface erythema.  No obvious wound or ulcer. Skin: no rashes, lesions, ulcers. No induration Neurologic: CN 2-12 grossly intact. Sensation intact, DTR normal. Strength 5/5 in all 4.  Psychiatric: Normal judgment and insight. Alert and oriented x 3. Normal mood.    Labs on Admission: I have personally reviewed following labs and imaging studies  CBC: Recent Labs  Lab 06/25/19 1712  WBC 14.5*  NEUTROABS 11.7*  HGB 15.5*  HCT 46.9*  MCV 96.3  PLT 941*   Basic Metabolic Panel: Recent Labs  Lab 06/25/19 1712  NA 135  K 3.3*  CL 93*  CO2 26  GLUCOSE 113*  BUN 18  CREATININE 2.26*  CALCIUM 8.8*   GFR: CrCl cannot be calculated (Unknown ideal weight.). Liver Function Tests: Recent Labs  Lab 06/25/19 1712  AST 11*  ALT 8  ALKPHOS 147*  BILITOT 0.9  PROT 8.4*  ALBUMIN 3.6   No results for input(s): LIPASE, AMYLASE in the last 168 hours. No results for input(s): AMMONIA in the last 168 hours. Coagulation Profile: Recent Labs  Lab 06/25/19 1712   INR 1.2   Cardiac Enzymes: No results for input(s): CKTOTAL, CKMB, CKMBINDEX, TROPONINI in the last 168 hours. BNP (last 3 results) No results for input(s): PROBNP in the last 8760 hours. HbA1C: No results for input(s): HGBA1C in the last 72 hours. CBG: No results for input(s): GLUCAP in the last 168 hours. Lipid Profile: No results for input(s): CHOL, HDL, LDLCALC, TRIG, CHOLHDL, LDLDIRECT in the last 72 hours. Thyroid Function Tests: No results for input(s): TSH, T4TOTAL, FREET4, T3FREE, THYROIDAB in the last 72 hours. Anemia Panel: No results for input(s): VITAMINB12, FOLATE, FERRITIN, TIBC, IRON, RETICCTPCT in the last 72 hours. Urine analysis:    Component Value Date/Time   COLORURINE YELLOW 10/17/2017 1402   APPEARANCEUR HAZY (A) 10/17/2017 1402   LABSPEC 1.013 10/17/2017 1402   PHURINE 5.0 10/17/2017 1402   GLUCOSEU NEGATIVE 10/17/2017 1402   HGBUR NEGATIVE 10/17/2017 1402   BILIRUBINUR NEGATIVE 10/17/2017 1402   KETONESUR 5 (A) 10/17/2017 1402   PROTEINUR NEGATIVE 10/17/2017 1402   NITRITE NEGATIVE 10/17/2017 1402   LEUKOCYTESUR NEGATIVE 10/17/2017 1402    Radiological Exams on Admission: Dg Foot Complete Left  Result Date: 06/25/2019 CLINICAL DATA:  Left foot pain/infection. Left second and third toe amputations. EXAM: LEFT FOOT - COMPLETE 3+ VIEW COMPARISON:  Foot radiograph June 27, 2011; 09/29/2017 FINDINGS: Flexion at the first IP joint. Patient status post distal amputation of the second and third digits. Proximal most aspect of the proximal phalanx of the second digit and a portion of the proximal phalanx of the third digit remain. No evidence for acute fracture or dislocation. Midfoot degenerative changes. Posterior and plantar calcaneal spurring. Soft tissue swelling about the foot. Vascular calcifications. IMPRESSION: Patient status post amputation of the majority of the second and third digits as above. No evidence for acute osseous abnormality.  Electronically Signed   By: Lovey Newcomer M.D.   On: 06/25/2019 18:13    EKG: Independently reviewed.  Assessment/Plan Principal Problem:   Cellulitis of left foot Active Problems:  Diabetes mellitus type 2, insulin dependent ([place])   Essential hypertension   ESRD on hemodialysis ([place])    1. Cellulitis of L foot - 1. Diabetic foot ulcer pathway (though no ulcer present) 2. ABx: unable to obtain easy IV access at this time, our options are either to go with non IV treatment options vs go with a LIJ which we would prefer not to do since she is on dialysis and they are already having access issues (non-functioning fistula on L upper arm, but they still want to try and see if they can create another one on L arm, etc). 3. 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  WBC of 15k. 4. Therefore will do IM rocephin + single dose of PO LZD for now pending result of MRSA PCR. 1. If MRSA PCR negative then can DC the LZD. 2. If MRSA PCR positive then consider continuing LZD until dialysis on Wed at which point likely can switch to vanc with dialysis. 5. BCx pending 2. HTN - 1. Cont home meds 3. DM2 - 1. Carb mod diet 2. Looks to be diet controlled 3. Check A1C 4. ESRD - 1. Call nephro in AM for routine dialysis during stay.  DVT prophylaxis: Eliquis Code Status: Full Family Communication: No family in room Disposition Plan: Home after admit Consults called: None Admission status: Place in 75  GARDNER, [place] Hospitalists  How to contact the [facility] Attending or Consulting provider [place] or covering provider during after hours [place], for this patient?  1. Check the care team in [facility] and look for a) attending/consulting [facility] provider listed and b) the [facility] team listed 2. Log into [email or web address]  Amion Physician Scheduling and messaging for groups and whole hospitals  On call and physician  scheduling software for group practices, residents, hospitalists and other medical providers for call, clinic, rotation and shift schedules. OnCall [employer] is a hospital-wide system for scheduling doctors and paging doctors on call. EasyPlot is for scientific plotting and data analysis.  [email or web address]  and use [facility]'s universal password to access. If you do not have the password, please contact the hospital operator.  3. Locate the [facility] provider you are looking for under [facility] and page to a number that you can be directly reached. 4. If you still have difficulty reaching the provider, please page the [facility] (Director on Call) for the Hospitalists listed on amion for assistance.  06/25/2019, 11:47 PM

## 2019-06-25 NOTE — Progress Notes (Signed)
Pharmacy Antibiotic Note  Kristen Berry is a 63 y.o. female with diabetes and PVD presented to ED on 06/25/2019 with possible left foot infection.  Pharmacy has been consulted for Zosyn dosing. *Note R-BKA. WBC up at 14.5. Lactic acid within normal limits. Currently afebrile. Xray does not show evidence of osteomyelitis. She receives HD Tues/Thursday/Saturday.   Plan: Zosyn 3.375g IV every 12 hours - 4 hour infusion.      Temp (24hrs), Avg:98.1 F (36.7 C), Min:97.9 F (36.6 C), Max:98.3 F (36.8 C)  Recent Labs  Lab 06/25/19 1712  WBC 14.5*  CREATININE 2.26*  LATICACIDVEN 1.2    CrCl cannot be calculated (Unknown ideal weight.).    Allergies  Allergen Reactions  . Crestor [Rosuvastatin Calcium] Other (See Comments)    Leg pain    Antimicrobials this admission: Zosyn 9/28 >>  Dose adjustments this admission:   Microbiology results: pending  Thank you for allowing pharmacy to be a part of this patient's care.  Brain Hilts 06/25/2019 10:40 PM

## 2019-06-25 NOTE — ED Triage Notes (Signed)
Pt from dialysis center for eval of left foot infection. Left foot swollen, red, hot to touch, missing middle toes. Right BKA   BP 118/70  P 115 97%

## 2019-06-25 NOTE — Progress Notes (Signed)
Pharmacy Antibiotic Note  Kristen Berry is a 63 y.o. female admitted on 06/25/2019 with diabetic foot wound.  Pharmacy has been consulted for Vancomycin dosing. WBC elevated. ESRD on HD TTS.  Plan: Vancomycin 2000 mg IV x 1, then 1000 mg IV qHD TTS Ceftriaxone/Flagyl per MD Trend WBC, temp, renal function  F/U infectious work-up Drug levels as indicated  Temp (24hrs), Avg:98.1 F (36.7 C), Min:97.9 F (36.6 C), Max:98.3 F (36.8 C)  Recent Labs  Lab 06/25/19 1712  WBC 14.5*  CREATININE 2.26*  LATICACIDVEN 1.2    CrCl cannot be calculated (Unknown ideal weight.).    Allergies  Allergen Reactions  . Crestor [Rosuvastatin Calcium] Other (See Comments)    Leg pain   Narda Bonds, PharmD, [place] Clinical Pharmacist Phone: [phone]

## 2019-06-26 ENCOUNTER — Encounter (HOSPITAL_COMMUNITY): Payer: Self-pay

## 2019-06-26 ENCOUNTER — Inpatient Hospital Stay (HOSPITAL_COMMUNITY): Payer: Medicare HMO

## 2019-06-26 DIAGNOSIS — E11628 Type 2 diabetes mellitus with other skin complications: Secondary | ICD-10-CM | POA: Diagnosis present

## 2019-06-26 DIAGNOSIS — I5022 Chronic systolic (congestive) heart failure: Secondary | ICD-10-CM | POA: Diagnosis present

## 2019-06-26 DIAGNOSIS — Z992 Dependence on renal dialysis: Secondary | ICD-10-CM | POA: Diagnosis not present

## 2019-06-26 DIAGNOSIS — Z89422 Acquired absence of other left toe(s): Secondary | ICD-10-CM | POA: Diagnosis not present

## 2019-06-26 DIAGNOSIS — Z87442 Personal history of urinary calculi: Secondary | ICD-10-CM | POA: Diagnosis not present

## 2019-06-26 DIAGNOSIS — I2510 Atherosclerotic heart disease of native coronary artery without angina pectoris: Secondary | ICD-10-CM | POA: Diagnosis present

## 2019-06-26 DIAGNOSIS — Z888 Allergy status to other drugs, medicaments and biological substances status: Secondary | ICD-10-CM | POA: Diagnosis not present

## 2019-06-26 DIAGNOSIS — A4902 Methicillin resistant Staphylococcus aureus infection, unspecified site: Secondary | ICD-10-CM | POA: Diagnosis present

## 2019-06-26 DIAGNOSIS — S88119A Complete traumatic amputation at level between knee and ankle, unspecified lower leg, initial encounter: Secondary | ICD-10-CM | POA: Diagnosis not present

## 2019-06-26 DIAGNOSIS — Z8249 Family history of ischemic heart disease and other diseases of the circulatory system: Secondary | ICD-10-CM | POA: Diagnosis not present

## 2019-06-26 DIAGNOSIS — N186 End stage renal disease: Secondary | ICD-10-CM | POA: Diagnosis present

## 2019-06-26 DIAGNOSIS — Z23 Encounter for immunization: Secondary | ICD-10-CM | POA: Diagnosis present

## 2019-06-26 DIAGNOSIS — E7800 Pure hypercholesterolemia, unspecified: Secondary | ICD-10-CM | POA: Diagnosis present

## 2019-06-26 DIAGNOSIS — I739 Peripheral vascular disease, unspecified: Secondary | ICD-10-CM | POA: Diagnosis not present

## 2019-06-26 DIAGNOSIS — B9562 Methicillin resistant Staphylococcus aureus infection as the cause of diseases classified elsewhere: Secondary | ICD-10-CM | POA: Diagnosis present

## 2019-06-26 DIAGNOSIS — E119 Type 2 diabetes mellitus without complications: Secondary | ICD-10-CM | POA: Diagnosis not present

## 2019-06-26 DIAGNOSIS — I2583 Coronary atherosclerosis due to lipid rich plaque: Secondary | ICD-10-CM | POA: Diagnosis present

## 2019-06-26 DIAGNOSIS — I132 Hypertensive heart and chronic kidney disease with heart failure and with stage 5 chronic kidney disease, or end stage renal disease: Secondary | ICD-10-CM | POA: Diagnosis present

## 2019-06-26 DIAGNOSIS — E1122 Type 2 diabetes mellitus with diabetic chronic kidney disease: Secondary | ICD-10-CM | POA: Diagnosis present

## 2019-06-26 DIAGNOSIS — E8889 Other specified metabolic disorders: Secondary | ICD-10-CM | POA: Diagnosis present

## 2019-06-26 DIAGNOSIS — Z20828 Contact with and (suspected) exposure to other viral communicable diseases: Secondary | ICD-10-CM | POA: Diagnosis present

## 2019-06-26 DIAGNOSIS — Z8673 Personal history of transient ischemic attack (TIA), and cerebral infarction without residual deficits: Secondary | ICD-10-CM | POA: Diagnosis not present

## 2019-06-26 DIAGNOSIS — G4730 Sleep apnea, unspecified: Secondary | ICD-10-CM | POA: Diagnosis present

## 2019-06-26 DIAGNOSIS — D631 Anemia in chronic kidney disease: Secondary | ICD-10-CM | POA: Diagnosis present

## 2019-06-26 DIAGNOSIS — L03116 Cellulitis of left lower limb: Secondary | ICD-10-CM | POA: Diagnosis present

## 2019-06-26 DIAGNOSIS — Z95820 Peripheral vascular angioplasty status with implants and grafts: Secondary | ICD-10-CM | POA: Diagnosis not present

## 2019-06-26 DIAGNOSIS — J45909 Unspecified asthma, uncomplicated: Secondary | ICD-10-CM | POA: Diagnosis present

## 2019-06-26 DIAGNOSIS — Z89511 Acquired absence of right leg below knee: Secondary | ICD-10-CM | POA: Diagnosis not present

## 2019-06-26 LAB — HEMOGLOBIN A1C
Hgb A1c MFr Bld: 5.8 % — ABNORMAL HIGH (ref 4.8–5.6)
Mean Plasma Glucose: 119.76 mg/dL

## 2019-06-26 LAB — BASIC METABOLIC PANEL WITH GFR
Anion gap: 18 — ABNORMAL HIGH (ref 5–15)
BUN: 27 mg/dL — ABNORMAL HIGH (ref 8–23)
CO2: 22 mmol/L (ref 22–32)
Calcium: 8.9 mg/dL (ref 8.9–10.3)
Chloride: 95 mmol/L — ABNORMAL LOW (ref 98–111)
Creatinine, Ser: 3.07 mg/dL — ABNORMAL HIGH (ref 0.44–1.00)
GFR calc Af Amer: 18 mL/min — ABNORMAL LOW
GFR calc non Af Amer: 15 mL/min — ABNORMAL LOW
Glucose, Bld: 83 mg/dL (ref 70–99)
Potassium: 3.7 mmol/L (ref 3.5–5.1)
Sodium: 135 mmol/L (ref 135–145)

## 2019-06-26 LAB — CBC
HCT: 47.9 % — ABNORMAL HIGH (ref 36.0–46.0)
Hemoglobin: 15 g/dL (ref 12.0–15.0)
MCH: 31.1 pg (ref 26.0–34.0)
MCHC: 31.3 g/dL (ref 30.0–36.0)
MCV: 99.4 fL (ref 80.0–100.0)
Platelets: 746 10*3/uL — ABNORMAL HIGH (ref 150–400)
RBC: 4.82 MIL/uL (ref 3.87–5.11)
RDW: 14.9 % (ref 11.5–15.5)
WBC: 14.5 10*3/uL — ABNORMAL HIGH (ref 4.0–10.5)
nRBC: 0 % (ref 0.0–0.2)

## 2019-06-26 LAB — HIV ANTIBODY (ROUTINE TESTING W REFLEX): HIV Screen 4th Generation wRfx: NONREACTIVE

## 2019-06-26 LAB — SARS CORONAVIRUS 2 (TAT 6-24 HRS): SARS Coronavirus 2: NEGATIVE

## 2019-06-26 LAB — SEDIMENTATION RATE: Sed Rate: 70 mm/hr — ABNORMAL HIGH (ref 0–22)

## 2019-06-26 LAB — C-REACTIVE PROTEIN: CRP: 15.6 mg/dL — ABNORMAL HIGH (ref <1.0)

## 2019-06-26 LAB — MRSA PCR SCREENING: MRSA by PCR: POSITIVE — AB

## 2019-06-26 MED ORDER — VANCOMYCIN HCL 10 G IV SOLR
2000.0000 mg | Freq: Once | INTRAVENOUS | Status: AC
  Administered 2019-06-26: 09:00:00 2000 mg via INTRAVENOUS
  Filled 2019-06-26: qty 2000

## 2019-06-26 MED ORDER — TICAGRELOR 90 MG PO TABS
90.0000 mg | ORAL_TABLET | Freq: Two times a day (BID) | ORAL | Status: DC
  Administered 2019-06-26 – 2019-06-28 (×5): 90 mg via ORAL
  Filled 2019-06-26 (×5): qty 1

## 2019-06-26 MED ORDER — AMLODIPINE BESYLATE 5 MG PO TABS
5.0000 mg | ORAL_TABLET | Freq: Every evening | ORAL | Status: DC
  Administered 2019-06-26 – 2019-06-27 (×2): 5 mg via ORAL
  Filled 2019-06-26 (×2): qty 1

## 2019-06-26 MED ORDER — ACETAMINOPHEN 500 MG PO TABS: 500.0000 mg | ORAL_TABLET | Freq: Three times a day (TID) | ORAL | Status: DC | PRN

## 2019-06-26 MED ORDER — CALCIUM ACETATE (PHOS BINDER) 667 MG PO CAPS
667.0000 mg | ORAL_CAPSULE | Freq: Two times a day (BID) | ORAL | Status: DC
  Administered 2019-06-26 – 2019-06-28 (×5): 667 mg via ORAL
  Filled 2019-06-26 (×5): qty 1

## 2019-06-26 MED ORDER — ATORVASTATIN CALCIUM 10 MG PO TABS
20.0000 mg | ORAL_TABLET | Freq: Every day | ORAL | Status: DC
  Administered 2019-06-26 – 2019-06-27 (×2): 20 mg via ORAL
  Filled 2019-06-26 (×2): qty 2

## 2019-06-26 MED ORDER — CHLORHEXIDINE GLUCONATE CLOTH 2 % EX PADS
6.0000 | MEDICATED_PAD | Freq: Every day | CUTANEOUS | Status: DC
  Administered 2019-06-26: 10:00:00 6 via TOPICAL

## 2019-06-26 MED ORDER — POLYETHYLENE GLYCOL 3350 17 G PO PACK: 17.0000 g | PACK | Freq: Every day | ORAL | Status: DC | PRN

## 2019-06-26 MED ORDER — APIXABAN 2.5 MG PO TABS
2.5000 mg | ORAL_TABLET | Freq: Two times a day (BID) | ORAL | Status: DC
  Administered 2019-06-26 – 2019-06-28 (×5): 2.5 mg via ORAL
  Filled 2019-06-26 (×5): qty 1

## 2019-06-26 MED ORDER — ALBUTEROL SULFATE (2.5 MG/3ML) 0.083% IN NEBU: 2.5000 mg | INHALATION_SOLUTION | Freq: Four times a day (QID) | RESPIRATORY_TRACT | Status: DC | PRN

## 2019-06-26 MED ORDER — SACCHAROMYCES BOULARDII 250 MG PO CAPS
250.0000 mg | ORAL_CAPSULE | Freq: Every evening | ORAL | Status: DC
  Administered 2019-06-26 – 2019-06-27 (×2): 250 mg via ORAL
  Filled 2019-06-26 (×2): qty 1

## 2019-06-26 MED ORDER — PANTOPRAZOLE SODIUM 40 MG PO TBEC
40.0000 mg | DELAYED_RELEASE_TABLET | Freq: Every evening | ORAL | Status: DC
  Administered 2019-06-26 – 2019-06-27 (×2): 40 mg via ORAL
  Filled 2019-06-26 (×2): qty 1

## 2019-06-26 MED ORDER — MUPIROCIN 2 % EX OINT
1.0000 "application " | TOPICAL_OINTMENT | Freq: Two times a day (BID) | CUTANEOUS | Status: DC
  Administered 2019-06-26 – 2019-06-28 (×5): 1 via NASAL
  Filled 2019-06-26 (×2): qty 22

## 2019-06-26 MED ORDER — CARVEDILOL 25 MG PO TABS
25.0000 mg | ORAL_TABLET | Freq: Two times a day (BID) | ORAL | Status: DC
  Administered 2019-06-26 – 2019-06-28 (×4): 25 mg via ORAL
  Filled 2019-06-26 (×3): qty 1
  Filled 2019-06-26: qty 2

## 2019-06-26 MED ORDER — HYDRALAZINE HCL 25 MG PO TABS
25.0000 mg | ORAL_TABLET | Freq: Two times a day (BID) | ORAL | Status: DC
  Administered 2019-06-26 – 2019-06-28 (×4): 25 mg via ORAL
  Filled 2019-06-26 (×4): qty 1

## 2019-06-26 NOTE — Consult Note (Signed)
REASON FOR CONSULT:    Cellulitis left foot with peripheral vascular disease.  ASSESSMENT & PLAN:   PERIPHERAL VASCULAR DISEASE: This patient had been admitted with cellulitis of the left foot which is improving.  However her noninvasive studies suggest underlying peripheral vascular disease.  Her previous study in May showed occlusion of the left superficial femoral artery with reconstitution of the popliteal artery and single-vessel runoff via the anterior tibial artery.  She was not felt to be a good candidate for an endovascular approach.  If she developed a limb threatening problem she would be considered for a left femoropopliteal bypass graft.  This was outlined by Dr. Trula Slade.  Given that the cellulitis is improving I think we can simply follow this as an outpatient.  I will notify Dr. Trula Slade of her admission.  Deitra Mayo, MD, FACS Beeper [phone] Office: [phone]   HPI:   Kristen Berry is a pleasant 63 y.o. female, who was admitted late last night with cellulitis of the left foot.  She was started on intravenous antibiotics and her cellulitis is improving.  However she had noninvasive studies which suggested significant peripheral vascular disease and for this reason vascular surgery was consulted.  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 Sharol Given.  Patient does have a prosthesis but she says that currently her activity is fairly limited.  She has a history of diabetes and developed cellulitis of the left foot and for this reason was admitted for intravenous antibiotics.  Of note the patient also has a history of end-stage renal disease and dialyzes on Monday Wednesdays and Fridays.  She has a functioning dialysis catheter.  She is had some work on her left upper arm fistula which is currently still not usable.  This patient had an intraoperative arteriogram on  02/15/2019.  This showed no significant inflow disease on the left.  On the left side she had occlusion of her superficial femoral artery proximally with reconstitution of the above-knee popliteal artery and single-vessel runoff via the anterior tibial artery.  Given that there was single-vessel runoff the risk of embolization it was felt that if she ultimately required intervention a bypass would be preferable.  Past Medical History:  Diagnosis Date  . Anemia   . Arthritis    "hands, knees" (10/10/2017)  . Asthma   . CHF (congestive heart failure) ([place])   . Chronic kidney disease (CKD), stage IV (severe) ([place])    Dialysis T/ Th/ Sat  . Coronary artery disease   . H/O Clostridium difficile infection   . High cholesterol   . History of kidney stones   . Hypertension   . PVD (peripheral vascular disease) ([place])    "LLE; will have OR" (10/10/2017)  . Sleep apnea    "never given mask" (10/10/2017)  . Stroke [facility])    mild stroke mid April - 2019  . Type II diabetes mellitus ([place])    "no RX anymore" (10/10/2017)    Family History  Problem Relation Age of Onset  . Hypertension Father   . Heart failure Maternal Grandmother   . Heart failure Maternal Grandfather     SOCIAL HISTORY: Social History   Socioeconomic History  . Marital status: Divorced    Spouse name: Not on file  . Number of children: 3  . Years of education: Not on file  . Highest education level: Not on file  Occupational  History  . Occupation: disabled  Social Needs  . Financial resource strain: Not on file  . Food insecurity    Worry: Not on file    Inability: Not on file  . Transportation needs    Medical: Not on file    Non-medical: Not on file  Tobacco Use  . Smoking status: Never Smoker  . Smokeless tobacco: Never Used  Substance and Sexual Activity  . Alcohol use: No  . Drug use: No  . Sexual activity: Never  Lifestyle  . Physical activity    Days per week: Not on file    Minutes per session: Not  on file  . Stress: Not on file  Relationships  . Social Herbalist on phone: Not on file    Gets together: Not on file    Attends religious service: Not on file    Active member of club or organization: Not on file    Attends meetings of clubs or organizations: Not on file    Relationship status: Not on file  . Intimate partner violence    Fear of current or ex partner: Not on file    Emotionally abused: Not on file    Physically abused: Not on file    Forced sexual activity: Not on file  Other Topics Concern  . Not on file  Social History Narrative  . Not on file    Allergies  Allergen Reactions  . Crestor [Rosuvastatin Calcium] Other (See Comments)    Leg pain    Current Facility-Administered Medications  Medication Dose Route Frequency Provider Last Rate Last Dose  . acetaminophen (TYLENOL) tablet 650 mg  650 mg Oral Q6H PRN Etta Quill, DO       Or  . acetaminophen (TYLENOL) suppository 650 mg  650 mg Rectal Q6H PRN Etta Quill, DO      . albuterol (PROVENTIL) (2.5 MG/3ML) 0.083% nebulizer solution 2.5 mg  2.5 mg Inhalation Q6H PRN Etta Quill, DO      . amLODipine (NORVASC) tablet 5 mg  5 mg Oral QPM Jennette Kettle M, DO   5 mg at 06/26/19 1730  . apixaban (ELIQUIS) tablet 2.5 mg  2.5 mg Oral BID Etta Quill, DO   2.5 mg at 06/26/19 0912  . atorvastatin (LIPITOR) tablet 20 mg  20 mg Oral q1800 Jennette Kettle M, DO   20 mg at 06/26/19 1730  . calcium acetate (PHOSLO) capsule 667 mg  667 mg Oral BID WC Etta Quill, DO   667 mg at 06/26/19 1730  . carvedilol (COREG) tablet 25 mg  25 mg Oral BID WC Jennette Kettle M, DO   25 mg at 06/26/19 1730  . Chlorhexidine Gluconate Cloth 2 % PADS 6 each  6 each Topical Q0600 Geradine Girt, DO   6 each at 06/26/19 0931  . hydrALAZINE (APRESOLINE) tablet 25 mg  25 mg Oral BID Etta Quill, DO   25 mg at 06/26/19 1730  . mupirocin ointment (BACTROBAN) 2 % 1 application  1 application Nasal BID Eulogio Bear U, DO   1 application at 67/59/16 0930  . ondansetron (ZOFRAN) tablet 4 mg  4 mg Oral Q6H PRN Etta Quill, DO       Or  . ondansetron [facility]) injection 4 mg  4 mg Intravenous Q6H PRN Etta Quill, DO      . pantoprazole (PROTONIX) EC tablet 40 mg  40 mg Oral QPM Alcario Drought,  Toy Care, DO   40 mg at 06/26/19 1729  . polyethylene glycol (MIRALAX / GLYCOLAX) packet 17 g  17 g Oral Daily PRN Etta Quill, DO      . saccharomyces boulardii (FLORASTOR) capsule 250 mg  250 mg Oral QPM Jennette Kettle M, DO   250 mg at 06/26/19 1730  . ticagrelor (BRILINTA) tablet 90 mg  90 mg Oral BID Etta Quill, DO   90 mg at 06/26/19 3474    REVIEW OF SYSTEMS:  [X]  denotes positive finding, [ ]  denotes negative finding Cardiac  Comments:  Chest pain or chest pressure:    Shortness of breath upon exertion: x   Short of breath when lying flat:    Irregular heart rhythm:        Vascular    Pain in calf, thigh, or hip brought on by ambulation:    Pain in feet at night that wakes you up from your sleep:     Blood clot in your veins:    Leg swelling:         Pulmonary    Oxygen at home:    Productive cough:     Wheezing:         Neurologic    Sudden weakness in arms or legs:     Sudden numbness in arms or legs:     Sudden onset of difficulty speaking or slurred speech:    Temporary loss of vision in one eye:     Problems with dizziness:         Gastrointestinal    Blood in stool:     Vomited blood:         Genitourinary    Burning when urinating:     Blood in urine:        Psychiatric    Major depression:         Hematologic    Bleeding problems:    Problems with blood clotting too easily:        Skin    Rashes or ulcers:        Constitutional    Fever or chills:     PHYSICAL EXAM:   Vitals:   06/26/19 0725 06/26/19 0730 06/26/19 0753 06/26/19 1729  BP:  (!) 151/89 (!) 96/29 (!) 132/55  Pulse:  84 83   Resp:   16   Temp:   97.7 F (36.5 C)   TempSrc:   Oral    SpO2:  99% 97%   Weight: 79.4 kg     Height: 5\' 8"  (1.727 m)       GENERAL: The patient is a well-nourished female, in no acute distress. The vital signs are documented above. CARDIAC: There is a regular rate and rhythm.  VASCULAR: She has palpable femoral pulses. She has a right below the knee amputation. On the left side she has a monophasic dorsalis pedis signal only. She has mild cellulitis and swelling of the left foot. She does have a thrill in her left upper arm fistula. PULMONARY: There is good air exchange bilaterally without wheezing or rales. ABDOMEN: Soft and non-tender with normal pitched bowel sounds.  MUSCULOSKELETAL: She has a right below the knee amputation which is healed. NEUROLOGIC: No focal weakness or paresthesias are detected. SKIN: She has mild cellulitis of the left foot. PSYCHIATRIC: The patient has a normal affect.  DATA:    ARTERIAL DOPPLER STUDY: I have independently interpreted her arterial Doppler study today.  She has a  monophasic dorsalis pedis signal on the left with the Doppler.  ABI is 29%.  The posterior tibial signal cannot be obtained.  Potassium is 3.7.  White blood cell count 14.5.

## 2019-06-26 NOTE — ED Notes (Signed)
MS ordered bfast 

## 2019-06-26 NOTE — Progress Notes (Addendum)
ABI's have been completed. Preliminary results can be found in CV Proc through chart review.  Results were given to the patient's nurse, Rosaria Ferries.  06/26/19 4:15 PM Kristen Berry RVT

## 2019-06-26 NOTE — Consult Note (Signed)
[facility] Renal Consultation Note    Indication for Consultation:  Management of ESRD/hemodialysis; anemia, hypertension/volume and secondary hyperparathyroidism  HPI: Kristen Berry is a 63 y.o. female with ESRD on HD,  DM Type 2,  HTN, CHF (EF 35-40%), CAD, MGUS, Hx DVT, PVD s/p angioplasty/stenting L SFA,  L 2nd toe amp Admitted for management of left foot cellulitis. Vancomycin ordered. Blood cultures collected.  Labs: K 3.7, CO2 22, WBCs 14.5, Hgb 15.0. Reports worsening swelling and erythema in left foot starting Saturday. She reports subjective fever at home this weekend.     [place] MWF. Compliant with dialysis Rx. Completed full treatment Monday. Usually 4-5L fluid gains.   Seen and examined at bedside. Appears comfortable. No complaints.  Afebrile. Denies chills, CP, SOB, N,V,D.   Past Medical History:  Diagnosis Date  . Anemia   . Arthritis    "hands, knees" (10/10/2017)  . Asthma   . CHF (congestive heart failure) ([place])   . Chronic kidney disease (CKD), stage IV (severe) ([place])    Dialysis T/ Th/ Sat  . Coronary artery disease   . H/O Clostridium difficile infection   . High cholesterol   . History of kidney stones   . Hypertension   . PVD (peripheral vascular disease) ([place])    "LLE; will have OR" (10/10/2017)  . Sleep apnea    "never given mask" (10/10/2017)  . Stroke [facility])    mild stroke mid April - 2019  . Type II diabetes mellitus ([place])    "no RX anymore" (10/10/2017)   Past Surgical History:  Procedure Laterality Date  . A/V FISTULAGRAM N/A 12/27/2018   Procedure: A/V FISTULAGRAM - Left Upper;  Surgeon: Serafina Mitchell, MD;  Location: [place] [facility];  Service: Cardiovascular;  Laterality: N/A;  . A/V FISTULAGRAM Left 05/22/2019   Procedure: A/V FISTULAGRAM;  Surgeon: Serafina Mitchell, MD;  Location: [place] CV [facility];  Service: Cardiovascular;  Laterality: Left;  . AMPUTATION Left 11/10/2017   Procedure: AMPUTATION DIGIT  SECOND TOE LEFT FOOT;  Surgeon: Waynetta Sandy, MD;  Location: [place];  Service: Vascular;  Laterality: Left;  . AMPUTATION Left 01/24/2018   Procedure: AMPUTATION THIRD TOE LEFT FOOT;  Surgeon: Waynetta Sandy, MD;  Location: [place];  Service: Vascular;  Laterality: Left;  . AMPUTATION Right 12/22/2018   Procedure: Right foot 1st ray amputation;  Surgeon: Newt Minion, MD;  Location: [place];  Service: Orthopedics;  Laterality: Right;  . AMPUTATION Right 01/05/2019   Procedure: RIGHT BELOW KNEE AMPUTATION;  Surgeon: Newt Minion, MD;  Location: [place];  Service: Orthopedics;  Laterality: Right;  . AORTOGRAM N/A 11/03/2017   Procedure: Ultrasound Guided Cannulation Right Common Femoral Artery;  Aortagram with Left Lower Extremity Arteriogram; Attempted Treatment Left Superficial Femoral Artery; Percutaneous Closure Right Common Femoral Arteriotomy with Proglide Device;  Surgeon: Waynetta Sandy, MD;  Location: [place];  Service: Vascular;  Laterality: N/A;  . APPLICATION OF WOUND VAC Right 01/05/2019   Procedure: Application Of Wound Vac;  Surgeon: Newt Minion, MD;  Location: [place];  Service: Orthopedics;  Laterality: Right;  . AV FISTULA PLACEMENT Left 11/10/2017   Procedure: ARTERIOVENOUS (AV) FISTULA CREATION LEFT UPPER ARM;  Surgeon: Waynetta Sandy, MD;  Location: [place];  Service: Vascular;  Laterality: Left;  . EXCHANGE OF A DIALYSIS CATHETER Right 02/15/2019   Procedure: EXCHANGE OF A TUNNELED DIALYSIS CATHETER;  Surgeon: Serafina Mitchell, MD;  [facility];  Service: Vascular;  Laterality: Right;  . FISTULA SUPERFICIALIZATION Left 02/15/2019   Procedure: SUPERFICIALIZATION WITH BRANCH LIGATION BRACHIOCEPHALIC ARTERIOVENOUS FISTULA LEFT ARM;  Surgeon: Serafina Mitchell, MD;  Location: [place];  Service: Vascular;  Laterality: Left;  . HERNIA REPAIR  1950s  . INSERTION OF DIALYSIS CATHETER Right 11/10/2017   Procedure: INSERTION OF TUNNELED DIALYSIS CATHETER  RIGHT INTERNAL JUGULAR PLACEMENT;  Surgeon: Waynetta Sandy, MD;  Location: [place];  Service: Vascular;  Laterality: Right;  . IR FLUORO GUIDE CV LINE RIGHT  10/19/2017  . IR US GUIDE VASC ACCESS RIGHT  10/19/2017  . LEFT HEART CATH AND CORONARY ANGIOGRAPHY N/A 11/01/2017   Procedure: LEFT HEART CATH AND CORONARY ANGIOGRAPHY;  Surgeon: Belva Crome, MD;  Location: [place] CV [facility];  Service: Cardiovascular;  Laterality: N/A;  . LOWER EXTREMITY ANGIOGRAM Left 02/15/2019   Procedure: ANGIOGRAM LEFT LOWER EXTREMITY;  Surgeon: Serafina Mitchell, MD;  Location: [place];  Service: Vascular;  Laterality: Left;  . LOWER EXTREMITY ANGIOGRAPHY N/A 12/27/2018   Procedure: LOWER EXTREMITY ANGIOGRAPHY - Right Lower;  Surgeon: Serafina Mitchell, MD;  Location: [place] CV [facility];  Service: Cardiovascular;  Laterality: N/A;  . PERIPHERAL VASCULAR BALLOON ANGIOPLASTY Left 05/22/2019   Procedure: PERIPHERAL VASCULAR BALLOON ANGIOPLASTY;  Surgeon: Serafina Mitchell, MD;  Location: [place] [facility];  Service: Cardiovascular;  Laterality: Left;  fistula  . PERIPHERAL VASCULAR INTERVENTION Left 11/07/2017   Procedure: PERIPHERAL VASCULAR INTERVENTION;  Surgeon: Waynetta Sandy, MD;  Location: [place] CV [facility];  Service: Cardiovascular;  Laterality: Left;  left SFA  . PERIPHERAL VASCULAR INTERVENTION Right 12/27/2018   Procedure: PERIPHERAL VASCULAR INTERVENTION;  Surgeon: Serafina Mitchell, MD;  Location: [place] CV [facility];  Service: Cardiovascular;  Laterality: Right;  SFA  . SHOULDER OPEN ROTATOR CUFF REPAIR Left   . TUBAL LIGATION    . ULTRASOUND GUIDANCE FOR VASCULAR ACCESS  11/01/2017   Procedure: Ultrasound Guidance For Vascular Access;  Surgeon: Belva Crome, MD;  Location: [place] CV [facility];  Service: Cardiovascular;;  . ULTRASOUND GUIDANCE FOR VASCULAR ACCESS Right 02/15/2019   Procedure: Ultrasound Guidance For Vascular Access;  Surgeon: Serafina Mitchell, MD;  Location: [facility] OR;  Service: Vascular;   Laterality: Right;   Family History  Problem Relation Age of Onset  . Hypertension Father   . Heart failure Maternal Grandmother   . Heart failure Maternal Grandfather    Social History:  reports that she has never smoked. She has never used smokeless tobacco. She reports that she does not drink alcohol or use drugs. Allergies  Allergen Reactions  . Crestor [Rosuvastatin Calcium] Other (See Comments)    Leg pain   Prior to Admission medications   Medication Sig Start Date End Date Taking? Authorizing Provider  acetaminophen (TYLENOL) 500 MG tablet Take 500 mg by mouth every 8 (eight) hours as needed for mild pain or headache.    Yes [provider]  albuterol (PROAIR HFA) 108 (90 Base) MCG/ACT inhaler Inhale 2 puffs into the lungs every 6 (six) hours as needed for wheezing or shortness of breath.   Yes [provider]  alteplase (CATHFLO ACTIVASE) 2 MG injection 2 mg by Intracatheter route once as needed for open catheter. 12/30/18  Yes Bonnita Hollow, MD  amLODipine (NORVASC) 5 MG tablet Take 5 mg by mouth every evening.    Yes [provider]  apixaban (ELIQUIS) 2.5 MG TABS tablet Take 1 tablet (2.5 mg total) by  mouth 2 (two) times daily. 12/30/18  Yes Bonnita Hollow, MD  atorvastatin (LIPITOR) 20 MG tablet Take 1 tablet (20 mg total) by mouth daily at 6 PM. 11/16/17  Yes Domenic Polite, MD  calcium acetate (PHOSLO) 667 MG capsule Take 667 mg by mouth 2 (two) times daily with a meal.  10/27/18  Yes [provider]  carvedilol (COREG) 25 MG tablet Take 25 mg by mouth 2 (two) times daily. 08/26/18  Yes [provider]  clotrimazole-betamethasone (LOTRISONE) cream Apply 1 application topically daily as needed (rash).  01/19/18  Yes [provider]  diphenoxylate-atropine (LOMOTIL) 2.5-0.025 MG tablet Take 1 tablet by mouth every 12 (twelve) hours as needed for diarrhea or loose stools.    Yes [provider]  hydrALAZINE  (APRESOLINE) 25 MG tablet Take 25 mg by mouth 2 (two) times daily.  01/25/18  Yes Rhyne, Hulen Shouts, PA-C  HYDROcodone-acetaminophen (NORCO) 5-325 MG tablet Take 1 tablet by mouth every 6 (six) hours as needed for moderate pain. 02/15/19  Yes Dagoberto Ligas, PA-C  lidocaine-prilocaine (EMLA) cream Apply 1 application topically daily as needed (dialysis).  09/28/18  Yes [provider]  pantoprazole (PROTONIX) 40 MG tablet Take 40 mg by mouth every evening.    Yes [provider]  pentafluoroprop-tetrafluoroeth Landry Dyke) AERO Apply 1 application topically as needed (topical anesthesia for hemodialysis). 12/30/18  Yes Bonnita Hollow, MD  polyethylene glycol [facility] / GLYCOLAX) packet Take 17 g by mouth daily as needed for mild constipation. 12/30/18  Yes Bonnita Hollow, MD  saccharomyces boulardii (FLORASTOR) 250 MG capsule Take 1 capsule (250 mg total) by mouth 2 (two) times daily. Patient taking differently: Take 250 mg by mouth every evening.  01/12/18  Yes Caren Griffins, MD  ticagrelor (BRILINTA) 90 MG TABS tablet Take 90 mg by mouth 2 (two) times daily.    Yes [provider]   Current Facility-Administered Medications  Medication Dose Route Frequency Provider Last Rate Last Dose  . acetaminophen (TYLENOL) tablet 650 mg  650 mg Oral Q6H PRN Etta Quill, DO       Or  . acetaminophen (TYLENOL) suppository 650 mg  650 mg Rectal Q6H PRN Etta Quill, DO      . albuterol (PROVENTIL) (2.5 MG/3ML) 0.083% nebulizer solution 2.5 mg  2.5 mg Inhalation Q6H PRN Etta Quill, DO      . amLODipine (NORVASC) tablet 5 mg  5 mg Oral QPM Jennette Kettle M, DO      . apixaban [facility]) tablet 2.5 mg  2.5 mg Oral BID Jennette Kettle M, DO   2.5 mg at 06/26/19 0912  . atorvastatin (LIPITOR) tablet 20 mg  20 mg Oral q1800 Etta Quill, DO      . calcium acetate (PHOSLO) capsule 667 mg  667 mg Oral BID WC Etta Quill, DO   667 mg at 06/26/19 0845  . carvedilol  (COREG) tablet 25 mg  25 mg Oral BID WC Jennette Kettle M, DO   25 mg at 06/26/19 3086  . Chlorhexidine Gluconate Cloth 2 % PADS 6 each  6 each Topical Q0600 Geradine Girt, DO   6 each at 06/26/19 0931  . hydrALAZINE (APRESOLINE) tablet 25 mg  25 mg Oral BID Etta Quill, DO   25 mg at 06/26/19 5784  . mupirocin ointment (BACTROBAN) 2 % 1 application  1 application Nasal BID Eulogio Bear U, DO   1 application at 69/62/95 0930  .  ondansetron (ZOFRAN) tablet 4 mg  4 mg Oral Q6H PRN Etta Quill, DO       Or  . ondansetron [facility]) injection 4 mg  4 mg Intravenous Q6H PRN Etta Quill, DO      . pantoprazole (PROTONIX) EC tablet 40 mg  40 mg Oral QPM Jennette Kettle M, DO      . polyethylene glycol (MIRALAX / GLYCOLAX) packet 17 g  17 g Oral Daily PRN Etta Quill, DO      . saccharomyces boulardii (FLORASTOR) capsule 250 mg  250 mg Oral QPM Jennette Kettle M, DO      . ticagrelor [facility]) tablet 90 mg  90 mg Oral BID Jennette Kettle M, DO   90 mg at 06/26/19 0912     ROS: As per HPI otherwise negative.  Physical Exam: Vitals:   06/26/19 0715 06/26/19 0725 06/26/19 0730 06/26/19 0753  BP: (!) 159/95  (!) 151/89 (!) 96/29  Pulse: 79  84 83  Resp: 16   16  Temp:    97.7 F (36.5 C)  TempSrc:    Oral  SpO2: 97%  99% 97%  Weight:  79.4 kg    Height:  5\' 8"  (1.727 m)       General: WDWN elderly female NAD  Head: NCAT sclera not icteric  Neck: Supple. No JVD No masses Lungs: CTA bilaterally without wheezes, rales, or rhonchi. Breathing is unlabored. Heart: RRR with S1 S2 Abdomen: soft NT + BS Lower extremities:  R BKA; No stump edema; L foot s/p toe amp +swelling/erythema/warmth Neuro: A & O  X 3. Moves all extremities spontaneously. Psych:  Responds to questions appropriately with a normal affect. Dialysis [facility]   Labs: Basic Metabolic Panel: Recent Labs  Lab 06/25/19 1712 06/26/19 0334  NA 135 135  K 3.3* 3.7  CL 93* 95*  CO2 26 22  GLUCOSE 113*  83  BUN 18 27*  CREATININE 2.26* 3.07*  CALCIUM 8.8* 8.9   Liver Function Tests: Recent Labs  Lab 06/25/19 1712  AST 11*  ALT 8  ALKPHOS 147*  BILITOT 0.9  PROT 8.4*  ALBUMIN 3.6   No results for input(s): LIPASE, AMYLASE in the last 168 hours. No results for input(s): AMMONIA in the last 168 hours. CBC: Recent Labs  Lab 06/25/19 1712 06/26/19 0334  WBC 14.5* 14.5*  NEUTROABS 11.7*  --   HGB 15.5* 15.0  HCT 46.9* 47.9*  MCV 96.3 99.4  PLT 760* 746*   Cardiac Enzymes: No results for input(s): CKTOTAL, CKMB, CKMBINDEX, TROPONINI in the last 168 hours. CBG: No results for input(s): GLUCAP in the last 168 hours. Iron Studies: No results for input(s): IRON, TIBC, TRANSFERRIN, FERRITIN in the last 72 hours. Studies/Results: Dg Foot Complete Left  Result Date: 06/25/2019 CLINICAL DATA:  Left foot pain/infection. Left second and third toe amputations. EXAM: LEFT FOOT - COMPLETE 3+ VIEW COMPARISON:  Foot radiograph June 27, 2011; 09/29/2017 FINDINGS: Flexion at the first IP joint. Patient status post distal amputation of the second and third digits. Proximal most aspect of the proximal phalanx of the second digit and a portion of the proximal phalanx of the third digit remain. No evidence for acute fracture or dislocation. Midfoot degenerative changes. Posterior and plantar calcaneal spurring. Soft tissue swelling about the foot. Vascular calcifications. IMPRESSION: Patient status post amputation of the majority of the second and third digits as above. No evidence for acute osseous abnormality. Electronically Signed   By:  Lovey Newcomer M.D.   On: 06/25/2019 18:13    Dialysis Orders:  [facility] MWF 4H 350/800 EDW 80kg 2K/2.25Ca Heparin 2400 Access TDC  No VDRA No ESA   Assessment/Plan: 1.  L foot cellulitis - Vancomycin per primary. Blood cultures neg to date.  2.  ESRD -  HD MWF. Continue on schedule. Next HD 9/30.  3.  Hypertension/volume  - BP adequate. Continue home meds. UF to  EDW as tolerated  4.  Anemia  - No ESA indications  5.  Metabolic bone disease - Ca ok. Not on VDRA. Continue  Ca acetate binder 6.  DM Type 2 -HgbA1c - 5.8%  7.  Nutrition - Renal diet/vitamins   Lynnda Child PA-C [facility] Pager [phone] 06/26/2019, 1:19 PM

## 2019-06-26 NOTE — Progress Notes (Addendum)
Pharmacy Antibiotic Note  Kristen Berry is a 63 y.o. female admitted on 06/25/2019 with diabetic foot wound.  Pharmacy has been consulted for Vancomycin dosing. WBC elevated. ESRD on HD MWF on schedule  IV access obtained - restarting vancomycin   Plan: Vancomycin 2000 mg IV x 1, then 1000 mg IV qHD Trend WBC, temp, renal function  F/U infectious work-up Drug levels as indicated  Temp (24hrs), Avg:98 F (36.7 C), Min:97.7 F (36.5 C), Max:98.3 F (36.8 C)  Recent Labs  Lab 06/25/19 1712 06/26/19 0334  WBC 14.5* 14.5*  CREATININE 2.26* 3.07*  LATICACIDVEN 1.2  --     Estimated Creatinine Clearance: 20.5 mL/min (A) (by C-G formula based on SCr of 3.07 mg/dL (H)).    Allergies  Allergen Reactions  . Crestor [Rosuvastatin Calcium] Other (See Comments)    Leg pain    Thank you Anette Guarneri, PharmD

## 2019-06-26 NOTE — Social Work (Signed)
CSW acknowledging consult for access to medications at discharge.  Pt has [employer] as well as Medicaid; at current time unaware of any pt assistance available. Should specific needs arise please secure chat or call.  CSW signing off. Please consult if any additional needs arise.  Westley Hummer, MSW, [place] Work [phone]

## 2019-06-26 NOTE — TOC Initial Note (Addendum)
Transition of Care [facility] - Initial/Assessment Note    Patient Details  Name: Kristen Berry MRN: [id number] Date of Birth: 1956-06-10  Transition of Care [facility]) CM/SW Contact:    Sharin Mons, RN Phone Number: 06/26/2019, 1:24 PM  Clinical Narrative:           Presents with cellulitis of L foot. Hx of DM2, HTN, ESRD dialysis MWF, PAD s/p R BKA and L 2nd and 3rd toe amputations. From home with daughter Kristen Berry. States independent with ADL's. Owns W/C and BSC. States has supportive family. Pt states has no problems affording Rx meds.  Son ,[name], to provide transportation to home @ the appointed time.  Perina Salvaggio (Son)     [phone]        No present needs identified per NCM. Pt states can care for foot per self. Not interested in home health services.  NCM will continue to monitor ....  Expected Discharge Plan: Home/Self Care(Daughter,Kristen Berry) Barriers to Discharge: Continued Medical Work up   Patient Goals and CMS Choice Patient states their goals for this hospitalization and ongoing recovery are:: To get my foot under control      Expected Discharge Plan and Services Expected Discharge Plan: Home/Self Care(Daughter,Kristen Berry)   Discharge Planning Services: CM Consult Post Acute Care Choice: Durable Medical Equipment(W/C,BSC) Living arrangements for the past 2 months: Mobile Home                 DME Arranged: N/A DME Agency: NA       HH Arranged: NA [place] Agency: NA        Prior Living Arrangements/Services Living arrangements for the past 2 months: Mobile Home Lives with:: Adult Children Patient language and need for interpreter reviewed:: Yes Do you feel safe going back to the place where you live?: Yes      Need for Family Participation in Patient Care: Yes (Comment) Care giver support system in place?: Yes (comment)   Criminal Activity/Legal Involvement Pertinent to Current Situation/Hospitalization: No - Comment as needed  Activities of Daily Living Home  Assistive Devices/Equipment: Wheelchair, Shower chair with back ADL Screening (condition at time of admission) Patient's cognitive ability adequate to safely complete daily activities?: Yes Is the patient deaf or have difficulty hearing?: No Does the patient have difficulty seeing, even when wearing glasses/contacts?: No Does the patient have difficulty concentrating, remembering, or making decisions?: No Patient able to express need for assistance with ADLs?: Yes Does the patient have difficulty dressing or bathing?: No Independently performs ADLs?: Yes (appropriate for developmental age) Does the patient have difficulty walking or climbing stairs?: No Weakness of Legs: None Weakness of Arms/Hands: None  Permission Sought/Granted Permission sought to share information with : Case Manager, Family Supports Permission granted to share information with : Yes, Release of Information Signed  Share Information with NAME: Jennica Tagliaferri (Son) [phone]           Emotional Assessment Appearance:: Appears stated age Attitude/Demeanor/Rapport: Gracious Affect (typically observed): Accepting Orientation: : Oriented to Self, Oriented to Place, Oriented to  Time, Oriented to Situation Alcohol / Substance Use: Not Applicable Psych Involvement: No (comment)  Admission diagnosis:  Cellulitis of left lower extremity [L03.116] Patient Active Problem List   Diagnosis Date Noted  . Cellulitis of left foot 06/25/2019  . Below knee amputation ([place])   . S/P BKA (below knee amputation), right ([place]) 01/05/2019  . ESRD on hemodialysis ([place])   . Coronary artery disease without angina pectoris   . Cellulitis 01/02/2019  .  Gangrene of right foot ([place])   . Right foot infection 12/21/2018  . PAD (peripheral artery disease) ([place]) 01/24/2018  . Peripheral artery disease ([place]) 01/24/2018  . C. difficile diarrhea 01/06/2018  . Hypokalemia 01/06/2018  . ESRD (end stage renal disease) ([place]) 01/06/2018  .  Prolonged QT interval 01/06/2018  . Hypotension 01/06/2018  . Multiple rib fractures 01/06/2018  . Pressure injury of skin 11/08/2017  . Coronary artery disease due to lipid rich plaque   . Chest pain   . Acute on chronic systolic CHF (congestive heart failure), NYHA class 3 ([place]) 10/18/2017  . PICC (peripherally inserted central catheter) in place   . Acute osteomyelitis of toe of left foot ([place])   . Essential hypertension 12/14/2016  . Abnormal bone xray 06/15/2016  . MGUS (monoclonal gammopathy of unknown significance) 05/18/2016  . Stage 4 chronic kidney disease ([place]) 05/18/2016  . CHF (congestive heart failure) ([place]) 05/18/2016  . Renal insufficiency 06/28/2013  . Other and unspecified hyperlipidemia 06/28/2013  . Obesity, unspecified 06/28/2013  . Pain in limb 06/28/2013  . Diabetes mellitus type 2, insulin dependent ([place]) 06/28/2013   PCP:  Martinique, Sarah T, MD Pharmacy:   [facility] [address] [place] [address] Phone: [phone] Fax: [phone]     Social Determinants of Health (SDOH) Interventions    Readmission Risk Interventions No flowsheet data found.

## 2019-06-26 NOTE — Progress Notes (Signed)
[facility] PROGRESS NOTE  Kristen Berry WER:[id number] DOB: [birth year] DOA: 06/25/2019 PCP: Martinique, Sarah T, MD  Assessment/Plan: #1.  Cellulitis of the left foot.  Left great toe anterior foot with swelling erythema heat nontender.  Initially vancomycin Rocephin/Flagyl ordered IV access was difficult to obtain so she was given Rocephin IM and Zyvox p.o. IV access was then obtained so antibiotics changed back to vancomycin per pharmacy.  Is a history of osteomyelitis. MRSA PCR positive.  She is afebrile hemodynamically stable and nontoxic-appearing.  Leukocytosis at 14.5. -Vancomycin per pharmacy -Bactroban ointment -Follow blood cultures  #2.  End-stage renal disease. Creatinine 3.07. She is a Monday Wednesday Friday schedule.  Has not missed any sessions. -Nephrology for dialysis orders -Continue home meds  #3.  Hypertension.  Fair control.  Home medications include Norvasc, Coreg, hydralazine -Continue home meds  #4.  Diabetes type 2.  Hemoglobin A1c 5.8. -Carb modified diet -Monitor  #5.  CAD.  No chest pain. -Continue home meds  Code Status: full Family Communication: patient Disposition Plan: home   Consultants:  [place] nephrology  Procedures:    Antibiotics:  Vancomycin 9/29>>  HPI/Subjective: Awake alert denies pain/discomfort  Objective: Vitals:   06/26/19 0730 06/26/19 0753  BP: (!) 151/89 (!) 96/29  Pulse: 84 83  Resp:  16  Temp:  97.7 F (36.5 C)  SpO2: 99% 97%    Intake/Output Summary (Last 24 hours) at 06/26/2019 1337 Last data filed at 06/26/2019 0911 Gross per 24 hour  Intake 222 ml  Output -  Net 222 ml   Filed Weights   06/26/19 0725  Weight: 79.4 kg    Exam:   General:  Awake alert no acute distress  Cardiovascular: rrr no mgr left foot erythema/swelling, PPP  Respiratory: no increased work of breathing BS clear bilaterally  Abdomen: obese soft +BS non-tender no guarding or rebounding  Musculoskeletal: left great  toe with erythema, swelling heat    Data Reviewed: Basic Metabolic Panel: Recent Labs  Lab 06/25/19 1712 06/26/19 0334  NA 135 135  K 3.3* 3.7  CL 93* 95*  CO2 26 22  GLUCOSE 113* 83  BUN 18 27*  CREATININE 2.26* 3.07*  CALCIUM 8.8* 8.9   Liver Function Tests: Recent Labs  Lab 06/25/19 1712  AST 11*  ALT 8  ALKPHOS 147*  BILITOT 0.9  PROT 8.4*  ALBUMIN 3.6   No results for input(s): LIPASE, AMYLASE in the last 168 hours. No results for input(s): AMMONIA in the last 168 hours. CBC: Recent Labs  Lab 06/25/19 1712 06/26/19 0334  WBC 14.5* 14.5*  NEUTROABS 11.7*  --   HGB 15.5* 15.0  HCT 46.9* 47.9*  MCV 96.3 99.4  PLT 760* 746*   Cardiac Enzymes: No results for input(s): CKTOTAL, CKMB, CKMBINDEX, TROPONINI in the last 168 hours. BNP (last 3 results) No results for input(s): BNP in the last 8760 hours.  ProBNP (last 3 results) No results for input(s): PROBNP in the last 8760 hours.  CBG: No results for input(s): GLUCAP in the last 168 hours.  Recent Results (from the past 240 hour(s))  Culture, blood (Routine x 2)     Status: None (Preliminary result)   Collection Time: 06/25/19  5:20 PM   Specimen: BLOOD  Result Value Ref Range Status   Specimen Description BLOOD RIGHT ANTECUBITAL  Final   Special Requests   Final    BOTTLES DRAWN AEROBIC AND ANAEROBIC Blood Culture adequate volume   Culture   Final  NO GROWTH < 24 HOURS Performed at [facility] [address] [place], [place] [address]    Report Status PENDING  Incomplete  SARS CORONAVIRUS 2 (TAT 6-24 HRS) Nasopharyngeal Nasopharyngeal Swab     Status: None   Collection Time: 06/26/19 12:15 AM   Specimen: Nasopharyngeal Swab  Result Value Ref Range Status   SARS Coronavirus 2 NEGATIVE NEGATIVE Final    Comment: (NOTE) SARS-CoV-2 target nucleic acids are NOT DETECTED. The SARS-CoV-2 RNA is generally detectable in upper and lower respiratory specimens during the acute phase of  infection. Negative results do not preclude SARS-CoV-2 infection, do not rule out co-infections with other pathogens, and should not be used as the sole basis for treatment or other patient management decisions. Negative results must be combined with clinical observations, patient history, and epidemiological information. The expected result is Negative. Fact Sheet for Patients: SugarRoll.be Fact Sheet for Healthcare Providers: https://www.woods-mathews.com/ This test is not yet approved or cleared by the [place] FDA and  has been authorized for detection and/or diagnosis of SARS-CoV-2 by FDA under an Emergency Use Authorization (EUA). This EUA will remain  in effect (meaning this test can be used) for the duration of the COVID-19 declaration under Section 56 4(b)(1) of the Act, 21 U.S.C. section 360bbb-3(b)(1), unless the authorization is terminated or revoked sooner. Performed at [facility], [place] [address], [place], [place] [address]   MRSA PCR Screening     Status: Abnormal   Collection Time: 06/26/19  8:00 AM   Specimen: Nasal Mucosa; Nasopharyngeal  Result Value Ref Range Status   MRSA by PCR POSITIVE (A) NEGATIVE Final    Comment:        The GeneXpert MRSA Assay (FDA approved for NASAL specimens only), is one component of a comprehensive MRSA colonization surveillance program. It is not intended to diagnose MRSA infection nor to guide or monitor treatment for MRSA infections. RESULT CALLED TO, READ BACK BY AND VERIFIED WITH: Lieutenant Diego RN 9:25 06/26/19 ([email or web address]) Performed at [facility], [place] [address] [place], [place] [address]      Studies: Dg Foot Complete Left  Result Date: 06/25/2019 CLINICAL DATA:  Left foot pain/infection. Left second and third toe amputations. EXAM: LEFT FOOT - COMPLETE 3+ VIEW COMPARISON:  Foot radiograph June 27, 2011; 09/29/2017 FINDINGS: Flexion at the first IP joint.  Patient status post distal amputation of the second and third digits. Proximal most aspect of the proximal phalanx of the second digit and a portion of the proximal phalanx of the third digit remain. No evidence for acute fracture or dislocation. Midfoot degenerative changes. Posterior and plantar calcaneal spurring. Soft tissue swelling about the foot. Vascular calcifications. IMPRESSION: Patient status post amputation of the majority of the second and third digits as above. No evidence for acute osseous abnormality. Electronically Signed   By: Lovey Newcomer M.D.   On: 06/25/2019 18:13    Scheduled Meds: . amLODipine  5 mg Oral QPM  . apixaban  2.5 mg Oral BID  . atorvastatin  20 mg Oral q1800  . calcium acetate  667 mg Oral BID WC  . carvedilol  25 mg Oral BID WC  . Chlorhexidine Gluconate Cloth  6 each Topical Q0600  . hydrALAZINE  25 mg Oral BID  . mupirocin ointment  1 application Nasal BID  . pantoprazole  40 mg Oral QPM  . saccharomyces boulardii  250 mg Oral QPM  . ticagrelor  90 mg Oral BID  Continuous Infusions:  Principal Problem:   Cellulitis of left foot Active Problems:   Essential hypertension   ESRD on hemodialysis (HCC)   Diabetes mellitus type 2, insulin dependent (HCC)   Coronary artery disease due to lipid rich plaque   Below knee amputation ([place])    Time spent: 63 minutes    Van Zandt NP  [facility]  If 7PM-7AM, please contact night-coverage at [email or web address], password [facility] 06/26/2019, 1:37 PM  LOS: 0 days

## 2019-06-27 DIAGNOSIS — Z794 Long term (current) use of insulin: Secondary | ICD-10-CM

## 2019-06-27 DIAGNOSIS — E119 Type 2 diabetes mellitus without complications: Secondary | ICD-10-CM

## 2019-06-27 DIAGNOSIS — I2510 Atherosclerotic heart disease of native coronary artery without angina pectoris: Secondary | ICD-10-CM

## 2019-06-27 DIAGNOSIS — I739 Peripheral vascular disease, unspecified: Secondary | ICD-10-CM

## 2019-06-27 DIAGNOSIS — Z992 Dependence on renal dialysis: Secondary | ICD-10-CM

## 2019-06-27 DIAGNOSIS — A4902 Methicillin resistant Staphylococcus aureus infection, unspecified site: Secondary | ICD-10-CM

## 2019-06-27 DIAGNOSIS — L03116 Cellulitis of left lower limb: Secondary | ICD-10-CM

## 2019-06-27 DIAGNOSIS — I10 Essential (primary) hypertension: Secondary | ICD-10-CM

## 2019-06-27 DIAGNOSIS — I2583 Coronary atherosclerosis due to lipid rich plaque: Secondary | ICD-10-CM

## 2019-06-27 DIAGNOSIS — N186 End stage renal disease: Secondary | ICD-10-CM

## 2019-06-27 DIAGNOSIS — S88119A Complete traumatic amputation at level between knee and ankle, unspecified lower leg, initial encounter: Secondary | ICD-10-CM

## 2019-06-27 LAB — RENAL FUNCTION PANEL
Albumin: 2.9 g/dL — ABNORMAL LOW (ref 3.5–5.0)
Anion gap: 16 — ABNORMAL HIGH (ref 5–15)
BUN: 53 mg/dL — ABNORMAL HIGH (ref 8–23)
CO2: 23 mmol/L (ref 22–32)
Calcium: 8.3 mg/dL — ABNORMAL LOW (ref 8.9–10.3)
Chloride: 95 mmol/L — ABNORMAL LOW (ref 98–111)
Creatinine, Ser: 4.15 mg/dL — ABNORMAL HIGH (ref 0.44–1.00)
GFR calc Af Amer: 12 mL/min — ABNORMAL LOW (ref >60)
GFR calc non Af Amer: 11 mL/min — ABNORMAL LOW (ref >60)
Glucose, Bld: 101 mg/dL — ABNORMAL HIGH (ref 70–99)
Phosphorus: 5.9 mg/dL — ABNORMAL HIGH (ref 2.5–4.6)
Potassium: 3.1 mmol/L — ABNORMAL LOW (ref 3.5–5.1)
Sodium: 134 mmol/L — ABNORMAL LOW (ref 135–145)

## 2019-06-27 LAB — HEPATITIS B SURFACE ANTIGEN: Hepatitis B Surface Ag: NONREACTIVE

## 2019-06-27 MED ORDER — HEPARIN SODIUM (PORCINE) 1000 UNIT/ML IJ SOLN
INTRAMUSCULAR | Status: AC
  Administered 2019-06-27: 12:00:00 2400 [IU]
  Filled 2019-06-27: qty 3

## 2019-06-27 MED ORDER — VANCOMYCIN HCL IN DEXTROSE 1-5 GM/200ML-% IV SOLN
INTRAVENOUS | Status: AC
  Filled 2019-06-27: qty 200

## 2019-06-27 MED ORDER — HEPARIN SODIUM (PORCINE) 1000 UNIT/ML IJ SOLN
INTRAMUSCULAR | Status: AC
  Administered 2019-06-27: 3400 [IU]
  Filled 2019-06-27: qty 4

## 2019-06-27 MED ORDER — VANCOMYCIN HCL IN DEXTROSE 1-5 GM/200ML-% IV SOLN
1000.0000 mg | INTRAVENOUS | Status: DC
  Administered 2019-06-27: 1000 mg via INTRAVENOUS
  Filled 2019-06-27: qty 200

## 2019-06-27 NOTE — Progress Notes (Signed)
PROGRESS NOTE    Kristen Berry  ATF:[id number] DOB: 1956-03-14 DOA: 06/25/2019 PCP: Martinique, Sarah T, MD   Brief Narrative:  Kristen Berry is a 62 y.o. female  here with left foot cellulitis.  Improving with IV vanc.  Suspect source of infection is the great toe.  I have placed ambulatory referral to podiatry for nail care outpatient.  ESRD- for HD [place].  ?abx with HD vs oral depending on response overnight   Assessment & Plan:   Principal Problem:   Cellulitis of left foot Active Problems:   Diabetes mellitus type 2, insulin dependent ([place])   Essential hypertension   Coronary artery disease due to lipid rich plaque   ESRD on hemodialysis (HCC)   Below knee amputation (HCC)   MRSA (methicillin resistant Staphylococcus aureus)  #1.  Cellulitis of the left foot.  Left great toe anterior foot with swelling erythema heat nontender.  Initially vancomycin Rocephin/Flagyl ordered IV access was difficult to obtain so she was given Rocephin IM and Zyvox p.o. IV access was then obtained so antibiotics changed back to vancomycin per pharmacy.  Is a history of osteomyelitis. MRSA PCR positive.  She is afebrile hemodynamically stable and nontoxic-appearing.  Leukocytosis at 14.5. -Continue with vancomycin per pharmacy -Bactroban ointment -Follow blood cultures which are negative to date  #2.  End-stage renal disease.  - She is a Monday Wednesday Friday schedule.  Has not missed any sessions. -Nephrology for dialysis orders -Continue home meds  #3.  Hypertension.  Fair control.  Home medications include Norvasc, Coreg, hydralazine -Continue home meds  #4.  Diabetes type 2.  Hemoglobin A1c 5.8. -Carb modified diet -Monitor  #5.  CAD.  No chest pain. -Continue home meds   DVT prophylaxis: YH:CWCBJSE  Code Status: full    Code Status Orders  (From admission, onward)         Start     Ordered   06/25/19 2328  Full code  Continuous     06/25/19 2327        Code Status  History    Date Active Date Inactive Code Status Order ID Comments User Context   02/15/2019 1717 02/15/2019 2040 Full Code [id number]  Iline Oven Inpatient   01/02/2019 1855 01/10/2019 1901 DNR [id number]  Everrett Coombe, MD ED   12/21/2018 2145 12/31/2018 1842 DNR [id number]  Benay Pike, MD Inpatient   01/24/2018 0849 01/25/2018 1503 Full Code [id number]  Iline Oven Inpatient   01/07/2018 0608 01/12/2018 2128 Full Code [id number]  Toy Baker, MD ED   11/07/2017 1831 11/16/2017 1424 Full Code [id number]  Waynetta Sandy, MD Inpatient   10/10/2017 2036 11/07/2017 1831 Full Code [id number]  Elwyn Reach, MD Inpatient   06/28/2013 0457 06/30/2013 1852 Full Code [id number]  Orvan Falconer, MD Inpatient   Advance Care Planning Activity     Family Communication: none today  Disposition Plan:   Patient remained inpatient additional day for continued IV antibiotics, currently patient is slow to improve and given history of MRSA warrants an additional day inpatient.  Without this treatment patient be at risk of life-threatening infection in the setting of peripheral vascular disease, end-stage renal disease and cellulitis. Consults called: None Admission status: Inpatient   Consultants:   Nephrology  Procedures:  Dg Foot Complete Left  Result Date: 06/25/2019 CLINICAL DATA:  Left foot pain/infection. Left second and third toe amputations. EXAM: LEFT FOOT - COMPLETE 3+ VIEW COMPARISON:  Foot radiograph June 27, 2011; 09/29/2017 FINDINGS:  Flexion at the first IP joint. Patient status post distal amputation of the second and third digits. Proximal most aspect of the proximal phalanx of the second digit and a portion of the proximal phalanx of the third digit remain. No evidence for acute fracture or dislocation. Midfoot degenerative changes. Posterior and plantar calcaneal spurring. Soft tissue swelling about the foot. Vascular calcifications. IMPRESSION: Patient status post  amputation of the majority of the second and third digits as above. No evidence for acute osseous abnormality. Electronically Signed   By: Lovey Newcomer M.D.   On: 06/25/2019 18:13   Vas [place] Burnard Bunting With/wo Tbi  Result Date: 06/26/2019 LOWER EXTREMITY DOPPLER STUDY Indications: Peripheral artery disease.  Vascular Interventions: 01/05/2019 - R BKA. Comparison Study: 01/20/2018 - R 0.57 L 0.95 Performing Technologist: Carlos Levering Rvt  Examination Guidelines: A complete evaluation includes at minimum, Doppler waveform signals and systolic blood pressure reading at the level of bilateral brachial, anterior tibial, and posterior tibial arteries, when vessel segments are accessible. Bilateral testing is considered an integral part of a complete examination. Photoelectric Plethysmograph (PPG) waveforms and toe systolic pressure readings are included as required and additional duplex testing as needed. Limited examinations for reoccurring indications may be performed as noted.  ABI Findings: +--------+------------------+-----+---------+--------+  Right    Rt Pressure (mmHg) Index Waveform  Comment   +--------+------------------+-----+---------+--------+  Brachial 136                      triphasic           +--------+------------------+-----+---------+--------+  PTA                                         BKA       +--------+------------------+-----+---------+--------+  DP                                          BKA       +--------+------------------+-----+---------+--------+ +--------+------------------+-----+--------+------------------+  Left     Lt Pressure (mmHg) Index Waveform Comment             +--------+------------------+-----+--------+------------------+  Brachial                                   AVF                 +--------+------------------+-----+--------+------------------+  PTA                                        Unable to insonate  +--------+------------------+-----+--------+------------------+  DP        39                 0.29                               +--------+------------------+-----+--------+------------------+ +-------+-----------+-----------+------------+------------+  ABI/TBI Today's ABI Today's TBI Previous ABI Previous TBI  +-------+-----------+-----------+------------+------------+  Right                           0.57                       +-------+-----------+-----------+------------+------------+  Left    0.29                    0.95                       +-------+-----------+-----------+------------+------------+  Summary: Right: BKA. Left: Resting left ankle-brachial index indicates critical left limb ischemia. Unable to obtain Posterior tibial artery signal. Unable to obtain TBI due to low amplitude waveforms.  *See table(s) above for measurements and observations.  Electronically signed by Deitra Mayo MD on 06/26/2019 at 6:50:30 PM.   Final      Antimicrobials:   Vancomycin   Subjective: No acute events overnight Resting in bed  Objective: Vitals:   06/26/19 1729 06/26/19 2001 06/27/19 0500 06/27/19 0749  BP: (!) 132/55 90/61 (!) 151/60 (!) 141/60  Pulse:  80 100 (!) 106  Resp:  16 17 18   Temp:  97.8 F (36.6 C) 98.3 F (36.8 C) 97.9 F (36.6 C)  TempSrc:  Oral Oral Oral  SpO2:  96% 97% 96%  Weight:      Height:        Intake/Output Summary (Last 24 hours) at 06/27/2019 0917 Last data filed at 06/27/2019 0753 Gross per 24 hour  Intake 480 ml  Output --  Net 480 ml   Filed Weights   06/26/19 0725  Weight: 79.4 kg    Examination:  General exam: Appears calm and comfortable  Respiratory system: Clear to auscultation. Respiratory effort normal. Cardiovascular system: S1 & S2 heard, RRR. No JVD, murmurs, rubs, gallops or clicks. No pedal edema. Gastrointestinal system: Abdomen is nondistended, soft and nontender. No organomegaly or masses felt. Normal bowel sounds heard. Central nervous system: Alert and oriented. No focal neurological  deficits. Extremities: Status post asymmetrical lower extremity amputation, also toe amputation on foot, warmth and erythema, no draining lesion noted Skin: Erythema warmth on foot as noted above no draining lesion noted Psychiatry: Judgement and insight appear normal. Mood & affect appropriate.     Data Reviewed: I have personally reviewed following labs and imaging studies  CBC: Recent Labs  Lab 06/25/19 1712 06/26/19 0334  WBC 14.5* 14.5*  NEUTROABS 11.7*  --   HGB 15.5* 15.0  HCT 46.9* 47.9*  MCV 96.3 99.4  PLT 760* 448*   Basic Metabolic Panel: Recent Labs  Lab 06/25/19 1712 06/26/19 0334  NA 135 135  K 3.3* 3.7  CL 93* 95*  CO2 26 22  GLUCOSE 113* 83  BUN 18 27*  CREATININE 2.26* 3.07*  CALCIUM 8.8* 8.9   GFR: Estimated Creatinine Clearance: 20.5 mL/min (A) (by C-G formula based on SCr of 3.07 mg/dL (H)). Liver Function Tests: Recent Labs  Lab 06/25/19 1712  AST 11*  ALT 8  ALKPHOS 147*  BILITOT 0.9  PROT 8.4*  ALBUMIN 3.6   No results for input(s): LIPASE, AMYLASE in the last 168 hours. No results for input(s): AMMONIA in the last 168 hours. Coagulation Profile: Recent Labs  Lab 06/25/19 1712  INR 1.2   Cardiac Enzymes: No results for input(s): CKTOTAL, CKMB, CKMBINDEX, TROPONINI in the last 168 hours. BNP (last 3 results) No results for input(s): PROBNP in the last 8760 hours. HbA1C: Recent Labs    06/26/19 1122  HGBA1C 5.8*   CBG: No results for input(s): GLUCAP in the last 168 hours. Lipid Profile: No results for input(s): CHOL, HDL, LDLCALC, TRIG, CHOLHDL, LDLDIRECT in the last 72 hours. Thyroid Function Tests: No results for input(s): TSH,  T4TOTAL, FREET4, T3FREE, THYROIDAB in the last 72 hours. Anemia Panel: No results for input(s): VITAMINB12, FOLATE, FERRITIN, TIBC, IRON, RETICCTPCT in the last 72 hours. Sepsis Labs: Recent Labs  Lab 06/25/19 1712  LATICACIDVEN 1.2    Recent Results (from the past 240 hour(s))  Culture,  blood (Routine x 2)     Status: None (Preliminary result)   Collection Time: 06/25/19  5:20 PM   Specimen: BLOOD  Result Value Ref Range Status   Specimen Description BLOOD RIGHT ANTECUBITAL  Final   Special Requests   Final    BOTTLES DRAWN AEROBIC AND ANAEROBIC Blood Culture adequate volume   Culture   Final    NO GROWTH 2 DAYS Performed at [facility], [address] [place], [place] [address]    Report Status PENDING  Incomplete  SARS CORONAVIRUS 2 (TAT 6-24 HRS) Nasopharyngeal Nasopharyngeal Swab     Status: None   Collection Time: 06/26/19 12:15 AM   Specimen: Nasopharyngeal Swab  Result Value Ref Range Status   SARS Coronavirus 2 NEGATIVE NEGATIVE Final    Comment: (NOTE) SARS-CoV-2 target nucleic acids are NOT DETECTED. The SARS-CoV-2 RNA is generally detectable in upper and lower respiratory specimens during the acute phase of infection. Negative results do not preclude SARS-CoV-2 infection, do not rule out co-infections with other pathogens, and should not be used as the sole basis for treatment or other patient management decisions. Negative results must be combined with clinical observations, patient history, and epidemiological information. The expected result is Negative. Fact Sheet for Patients: SugarRoll.be Fact Sheet for Healthcare Providers: https://www.woods-mathews.com/ This test is not yet approved or cleared by the [place] FDA and  has been authorized for detection and/or diagnosis of SARS-CoV-2 by FDA under an Emergency Use Authorization (EUA). This EUA will remain  in effect (meaning this test can be used) for the duration of the COVID-19 declaration under Section 56 4(b)(1) of the Act, 21 U.S.C. section 360bbb-3(b)(1), unless the authorization is terminated or revoked sooner. Performed at [facility], [place] [address], [place], [place] [address]   Culture, blood (Routine x 2)     Status: None  (Preliminary result)   Collection Time: 06/26/19  3:41 AM   Specimen: BLOOD  Result Value Ref Range Status   Specimen Description BLOOD RIGHT HAND  Final   Special Requests   Final    BOTTLES DRAWN AEROBIC AND ANAEROBIC Blood Culture results may not be optimal due to an inadequate volume of blood received in culture bottles   Culture   Final    NO GROWTH 1 DAY Performed at [facility], [place] [address] [place], [place] [address]    Report Status PENDING  Incomplete  MRSA PCR Screening     Status: Abnormal   Collection Time: 06/26/19  8:00 AM   Specimen: Nasal Mucosa; Nasopharyngeal  Result Value Ref Range Status   MRSA by PCR POSITIVE (A) NEGATIVE Final    Comment:        The GeneXpert MRSA Assay (FDA approved for NASAL specimens only), is one component of a comprehensive MRSA colonization surveillance program. It is not intended to diagnose MRSA infection nor to guide or monitor treatment for MRSA infections. RESULT CALLED TO, READ BACK BY AND VERIFIED WITH: Lieutenant Diego RN 9:25 06/26/19 ([email or web address]) Performed at [facility], [place] [address] [place], [place] [address]          Radiology Studies: Dg Foot Complete Left  Result  Date: 06/25/2019 CLINICAL DATA:  Left foot pain/infection. Left second and third toe amputations. EXAM: LEFT FOOT - COMPLETE 3+ VIEW COMPARISON:  Foot radiograph June 27, 2011; 09/29/2017 FINDINGS: Flexion at the first IP joint. Patient status post distal amputation of the second and third digits. Proximal most aspect of the proximal phalanx of the second digit and a portion of the proximal phalanx of the third digit remain. No evidence for acute fracture or dislocation. Midfoot degenerative changes. Posterior and plantar calcaneal spurring. Soft tissue swelling about the foot. Vascular calcifications. IMPRESSION: Patient status post amputation of the majority of the second and third digits as above. No evidence for acute osseous abnormality.  Electronically Signed   By: Lovey Newcomer M.D.   On: 06/25/2019 18:13   Vas [place] Burnard Bunting With/wo Tbi  Result Date: 06/26/2019 LOWER EXTREMITY DOPPLER STUDY Indications: Peripheral artery disease.  Vascular Interventions: 01/05/2019 - R BKA. Comparison Study: 01/20/2018 - R 0.57 L 0.95 Performing Technologist: Carlos Levering Rvt  Examination Guidelines: A complete evaluation includes at minimum, Doppler waveform signals and systolic blood pressure reading at the level of bilateral brachial, anterior tibial, and posterior tibial arteries, when vessel segments are accessible. Bilateral testing is considered an integral part of a complete examination. Photoelectric Plethysmograph (PPG) waveforms and toe systolic pressure readings are included as required and additional duplex testing as needed. Limited examinations for reoccurring indications may be performed as noted.  ABI Findings: +--------+------------------+-----+---------+--------+  Right    Rt Pressure (mmHg) Index Waveform  Comment   +--------+------------------+-----+---------+--------+  Brachial 136                      triphasic           +--------+------------------+-----+---------+--------+  PTA                                         BKA       +--------+------------------+-----+---------+--------+  DP                                          BKA       +--------+------------------+-----+---------+--------+ +--------+------------------+-----+--------+------------------+  Left     Lt Pressure (mmHg) Index Waveform Comment             +--------+------------------+-----+--------+------------------+  Brachial                                   AVF                 +--------+------------------+-----+--------+------------------+  PTA                                        Unable to insonate  +--------+------------------+-----+--------+------------------+  DP       39                 0.29                                +--------+------------------+-----+--------+------------------+ +-------+-----------+-----------+------------+------------+  ABI/TBI Today's ABI Today's TBI Previous ABI Previous TBI  +-------+-----------+-----------+------------+------------+  Right  0.57                       +-------+-----------+-----------+------------+------------+  Left    0.29                    0.95                       +-------+-----------+-----------+------------+------------+  Summary: Right: BKA. Left: Resting left ankle-brachial index indicates critical left limb ischemia. Unable to obtain Posterior tibial artery signal. Unable to obtain TBI due to low amplitude waveforms.  *See table(s) above for measurements and observations.  Electronically signed by Deitra Mayo MD on 06/26/2019 at 6:50:30 PM.   Final         Scheduled Meds:  amLODipine  5 mg Oral QPM   apixaban  2.5 mg Oral BID   atorvastatin  20 mg Oral q1800   calcium acetate  667 mg Oral BID WC   carvedilol  25 mg Oral BID WC   Chlorhexidine Gluconate Cloth  6 each Topical Q0600   hydrALAZINE  25 mg Oral BID   mupirocin ointment  1 application Nasal BID   pantoprazole  40 mg Oral QPM   saccharomyces boulardii  250 mg Oral QPM   ticagrelor  90 mg Oral BID   Continuous Infusions:   LOS: 1 day    Time spent: 35 min    Nicolette Bang, MD [facility]  If 7PM-7AM, please contact night-coverage  06/27/2019, 9:17 AM

## 2019-06-27 NOTE — Progress Notes (Signed)
Subjective  -  No complaints, currently in dialysis   Physical Exam:  Left foot with improving cellulitis       Assessment/Plan:    I discussed with the patient that since her cellulitis is improving, she may be able to avoid surgical revascularization.  She recently underwent angiography of her left leg and did not have good percutaneous revascularization options.  She would need a femoral below-knee popliteal bypass graft.  Given that her symptoms are improving, I would continue to monitor this.  If her cellulitis deteriorates I would need to consider bypass, otherwise I would continue treating her with IV antibiotics.  [place]  06/27/2019 9:37 PM --  Vitals:   06/27/19 1636 06/27/19 2100  BP: 129/60 (!) 127/47  Pulse: 94 92  Resp: 18 16  Temp: 97.6 F (36.4 C) 98.4 F (36.9 C)  SpO2: 97% 97%    Intake/Output Summary (Last 24 hours) at 06/27/2019 2137 Last data filed at 06/27/2019 1612 Gross per 24 hour  Intake 240 ml  Output 1000 ml  Net -760 ml     Laboratory CBC    Component Value Date/Time   WBC 14.5 (H) 06/26/2019 0334   HGB 15.0 06/26/2019 0334   HGB 14.3 12/14/2016 1139   HCT 47.9 (H) 06/26/2019 0334   HCT 44.2 12/14/2016 1139   PLT 746 (H) 06/26/2019 0334   PLT 429 (H) 12/14/2016 1139    BMET    Component Value Date/Time   NA 134 (L) 06/27/2019 1230   NA 137 12/14/2016 1139   K 3.1 (L) 06/27/2019 1230   K 4.2 12/14/2016 1139   CL 95 (L) 06/27/2019 1230   CO2 23 06/27/2019 1230   CO2 20 (L) 12/14/2016 1139   GLUCOSE 101 (H) 06/27/2019 1230   GLUCOSE 115 12/14/2016 1139   BUN 53 (H) 06/27/2019 1230   BUN 32.8 (H) 12/14/2016 1139   CREATININE 4.15 (H) 06/27/2019 1230   CREATININE 2.1 (H) 12/14/2016 1139   CALCIUM 8.3 (L) 06/27/2019 1230   CALCIUM 8.3 (L) 11/11/2017 0740   CALCIUM 9.5 12/14/2016 1139   GFRNONAA 11 (L) 06/27/2019 1230   GFRAA 12 (L) 06/27/2019 1230    COAG Lab Results  Component Value Date   INR 1.2  06/25/2019   INR 1.1 02/15/2019   INR 1.3 (H) 01/05/2019   No results found for: PTT  Antibiotics Anti-infectives (From admission, onward)   Start     Dose/Rate Route Frequency Ordered Stop   06/27/19 1502  vancomycin (VANCOCIN) 1-5 GM/200ML-% IVPB    Note to Pharmacy: Ashley Akin   : cabinet override      06/27/19 1502 06/27/19 1533   06/27/19 1200  vancomycin (VANCOCIN) IVPB 1000 mg/200 mL premix     1,000 mg 200 mL/hr over 60 Minutes Intravenous Every M-W-F (Hemodialysis) 06/27/19 0926     06/26/19 0830  vancomycin (VANCOCIN) 2,000 mg in sodium chloride 0.9 % 500 mL IVPB     2,000 mg 250 mL/hr over 120 Minutes Intravenous  Once 06/26/19 0815 06/26/19 1045   06/26/19 0000  linezolid (ZYVOX) tablet 600 mg  Status:  Discontinued     600 mg Oral Every 12 hours 06/25/19 2344 06/26/19 0809   06/25/19 2345  vancomycin (VANCOCIN) 2,000 mg in sodium chloride 0.9 % 500 mL IVPB  Status:  Discontinued     2,000 mg 250 mL/hr over 120 Minutes Intravenous  Once 06/25/19 2339 06/25/19 2343   06/25/19 2345  cefTRIAXone (ROCEPHIN) injection 1  g  Status:  Discontinued     1 g Intramuscular Every 24 hours 06/25/19 2343 06/26/19 0809   06/25/19 2330  cefTRIAXone (ROCEPHIN) 2 g in sodium chloride 0.9 % 100 mL IVPB  Status:  Discontinued     2 g 200 mL/hr over 30 Minutes Intravenous Daily at bedtime 06/25/19 2324 06/25/19 2343   06/25/19 2330  metroNIDAZOLE (FLAGYL) tablet 500 mg  Status:  Discontinued     500 mg Oral Every 8 hours 06/25/19 2324 06/25/19 2343   06/25/19 2300  piperacillin-tazobactam (ZOSYN) IVPB 3.375 g  Status:  Discontinued     3.375 g 12.5 mL/hr over 240 Minutes Intravenous Every 12 hours 06/25/19 2247 06/25/19 2320       V. Leia Alf, M.D., [facility] [place] Office: [phone] Pager:  [phone]

## 2019-06-27 NOTE — Progress Notes (Signed)
  [facility] Progress Note   Subjective:  Seen in HD unit.  UF goal 1.7L, tolerating. No complaints, cellulitis improving on antibiotics. No   Objective Vitals:   06/27/19 1400 06/27/19 1430 06/27/19 1506 06/27/19 1530  BP: (!) 144/64 140/67 132/68 (!) 147/71  Pulse: 96 96 (!) 101 98  Resp:      Temp:      TempSrc:      SpO2:      Weight:      Height:        Physical Exam General: WNWD NAD  Heart: RRR  Lungs: CTAB  Abdomen: soft NT  Extremities: R BKA; No stump edema; L foot s/p toe amp +swelling/erythema, improving  Dialysis Access: R IJ TDC    Weight change:    Additional Objective Labs: Basic Metabolic Panel: Recent Labs  Lab 06/25/19 1712 06/26/19 0334 06/27/19 1230  NA 135 135 134*  K 3.3* 3.7 3.1*  CL 93* 95* 95*  CO2 26 22 23   GLUCOSE 113* 83 101*  BUN 18 27* 53*  CREATININE 2.26* 3.07* 4.15*  CALCIUM 8.8* 8.9 8.3*  PHOS  --   --  5.9*   CBC: Recent Labs  Lab 06/25/19 1712 06/26/19 0334  WBC 14.5* 14.5*  NEUTROABS 11.7*  --   HGB 15.5* 15.0  HCT 46.9* 47.9*  MCV 96.3 99.4  PLT 760* 746*   Blood Culture    Component Value Date/Time   SDES BLOOD RIGHT HAND 06/26/2019 0341   SPECREQUEST  06/26/2019 0341    BOTTLES DRAWN AEROBIC AND ANAEROBIC Blood Culture results may not be optimal due to an inadequate volume of blood received in culture bottles   CULT  06/26/2019 0341    NO GROWTH 1 DAY Performed at [facility], [place] [address], [place], [place] [address]    REPTSTATUS PENDING 06/26/2019 8453     Medications: . vancomycin 1,000 mg (06/27/19 1505)   . amLODipine  5 mg Oral QPM  . apixaban  2.5 mg Oral BID  . atorvastatin  20 mg Oral q1800  . calcium acetate  667 mg Oral BID WC  . carvedilol  25 mg Oral BID WC  . Chlorhexidine Gluconate Cloth  6 each Topical Q0600  . hydrALAZINE  25 mg Oral BID  . mupirocin ointment  1 application Nasal BID  . pantoprazole  40 mg Oral QPM  . saccharomyces boulardii  250 mg  Oral QPM  . ticagrelor  90 mg Oral BID    Dialysis Orders:  [facility] MWF 4H 350/800 EDW 80kg 2K/2.25Ca Heparin 2400 Access TDC  No VDRA No ESA   Assessment/Plan: 1. L foot cellulitis - Vancomycin per primary. Blood cultures neg to date. Underlying PVD. Vascular following.  2.  ESRD -  HD MWF. HD today on schedule.  3.  Hypertension/volume  - BP adequate. Continue home meds. UF to EDW as tolerated  4.  Anemia  - No ESA indications  5.  Metabolic bone disease - Ca ok. Not on VDRA. Continue  Ca acetate binder 6.  DM Type 2 -HgbA1c - 5.8%  7.  Nutrition - Renal diet/vitamins   Lynnda Child PA-C [facility] Pager [phone] 06/27/2019,3:38 PM  LOS: 1 day

## 2019-06-28 LAB — CBC WITH DIFFERENTIAL/PLATELET
Abs Immature Granulocytes: 0.07 10*3/uL (ref 0.00–0.07)
Basophils Absolute: 0.1 10*3/uL (ref 0.0–0.1)
Basophils Relative: 1 %
Eosinophils Absolute: 0.3 10*3/uL (ref 0.0–0.5)
Eosinophils Relative: 3 %
HCT: 42.7 % (ref 36.0–46.0)
Hemoglobin: 13.7 g/dL (ref 12.0–15.0)
Immature Granulocytes: 1 %
Lymphocytes Relative: 15 %
Lymphs Abs: 1.6 10*3/uL (ref 0.7–4.0)
MCH: 30.6 pg (ref 26.0–34.0)
MCHC: 32.1 g/dL (ref 30.0–36.0)
MCV: 95.5 fL (ref 80.0–100.0)
Monocytes Absolute: 1.2 10*3/uL — ABNORMAL HIGH (ref 0.1–1.0)
Monocytes Relative: 11 %
Neutro Abs: 7.7 10*3/uL (ref 1.7–7.7)
Neutrophils Relative %: 69 %
Platelets: 649 10*3/uL — ABNORMAL HIGH (ref 150–400)
RBC: 4.47 MIL/uL (ref 3.87–5.11)
RDW: 14.8 % (ref 11.5–15.5)
WBC: 10.9 10*3/uL — ABNORMAL HIGH (ref 4.0–10.5)
nRBC: 0 % (ref 0.0–0.2)

## 2019-06-28 MED ORDER — INFLUENZA VAC SPLIT QUAD 0.5 ML IM SUSY: 0.5000 mL | PREFILLED_SYRINGE | Freq: Once | INTRAMUSCULAR | 0 refills | Status: DC

## 2019-06-28 MED ORDER — INFLUENZA VAC SPLIT QUAD 0.5 ML IM SUSY
0.5000 mL | PREFILLED_SYRINGE | Freq: Once | INTRAMUSCULAR | Status: AC
  Administered 2019-06-28: 12:00:00 0.5 mL via INTRAMUSCULAR
  Filled 2019-06-28: qty 0.5

## 2019-06-28 NOTE — Progress Notes (Signed)
Pt discharge education provided at bedside  Education provided on influenza vaccine  IV removed, catheter intact  Pt has all belongings including home wheelchair  Pt discharge via home wheelchair with RN

## 2019-06-28 NOTE — Discharge Summary (Signed)
Physician Discharge Summary  Kristen Berry VCB:[id number] DOB: 1956-04-02 DOA: 06/25/2019  PCP: Martinique, Sarah T, MD  Admit date: 06/25/2019 Discharge date: 06/28/2019  Admitted From: Inpatient Disposition: home  Recommendations for Outpatient Follow-up:  1. Follow up with PCP in 1-2 weeks 2. Please obtain BMP/CBC in one week 3. Hemodialysis to continue vancomycin per HD last date October 12 for total 14 days tx:  Home Health:No Equipment/Devices: Resume home equipment no new equipment Discharge Condition:Stable CODE STATUS:Full code Diet recommendation: Renal diet  Brief/Interim Summary: Kristen Mellette Duffyis a 63 y.o.femalehere with left foot cellulitis. Improving with IV vanc. Suspect source of infection is the great toe. I have placed ambulatory referral to podiatry for nail care outpatient. ESRD- for HD [place]. ?abx with HD vs oral depending on response overnight  Hospital course: Cellulitis left foot.  Patient was seen by vascular surgery because of history of peripheral vascular disease.  Initially placed on vancomycin Rocephin and Flagyl titrated down the vancomycin with history of MRSA PCR.  Patient's white count was slowly improved but was 10.9 with a discharge he was afebrile hemodynamically stable.  We will continue vancomycin per hemodialysis for total 14 days finished date October 12.  I discussed this with nephrology attending who is aware. Patient will continue dialysis on Monday Wednesday Friday schedule she is been compliant with no issues and as noted nephrology aware of antibiotic plan. While in hospital continue patient's home medications for hypertension diabetes and coronary artery disease no changes.  Continue home medications on discharge patient will follow-up with PCP for further titration.  Discharge Diagnoses:  Principal Problem:   Cellulitis of left foot Active Problems:   Diabetes mellitus type 2, insulin dependent ([place])   Essential hypertension    Coronary artery disease due to lipid rich plaque   ESRD on hemodialysis (HCC)   Below knee amputation (HCC)   MRSA (methicillin resistant Staphylococcus aureus)    Discharge Instructions  Discharge Instructions    Ambulatory referral to Podiatry   Complete by: As directed    Call MD for:  difficulty breathing, headache or visual disturbances   Complete by: As directed    Call MD for:  extreme fatigue   Complete by: As directed    Call MD for:  hives   Complete by: As directed    Call MD for:  persistant dizziness or light-headedness   Complete by: As directed    Call MD for:  persistant nausea and vomiting   Complete by: As directed    Call MD for:  redness, tenderness, or signs of infection (pain, swelling, redness, odor or green/yellow discharge around incision site)   Complete by: As directed    Call MD for:  severe uncontrolled pain   Complete by: As directed    Call MD for:  temperature >100.4   Complete by: As directed    Diet - low sodium heart healthy   Complete by: As directed    Increase activity slowly   Complete by: As directed      Allergies as of 06/28/2019      Reactions   Crestor [rosuvastatin Calcium] Other (See Comments)   Leg pain      Medication List    TAKE these medications   acetaminophen 500 MG tablet Commonly known as: TYLENOL Take 500 mg by mouth every 8 (eight) hours as needed for mild pain or headache.   alteplase 2 MG injection Commonly known as: CATHFLO ACTIVASE 2 mg by Intracatheter route once  as needed for open catheter.   amLODipine 5 MG tablet Commonly known as: NORVASC Take 5 mg by mouth every evening.   apixaban 2.5 MG Tabs tablet Commonly known as: ELIQUIS Take 1 tablet (2.5 mg total) by mouth 2 (two) times daily.   atorvastatin 20 MG tablet Commonly known as: LIPITOR Take 1 tablet (20 mg total) by mouth daily at 6 PM.   calcium acetate 667 MG capsule Commonly known as: PHOSLO Take 667 mg by mouth 2 (two) times daily  with a meal.   carvedilol 25 MG tablet Commonly known as: COREG Take 25 mg by mouth 2 (two) times daily.   clotrimazole-betamethasone cream Commonly known as: LOTRISONE Apply 1 application topically daily as needed (rash).   diphenoxylate-atropine 2.5-0.025 MG tablet Commonly known as: LOMOTIL Take 1 tablet by mouth every 12 (twelve) hours as needed for diarrhea or loose stools.   hydrALAZINE 25 MG tablet Commonly known as: APRESOLINE Take 25 mg by mouth 2 (two) times daily.   HYDROcodone-acetaminophen 5-325 MG tablet Commonly known as: Norco Take 1 tablet by mouth every 6 (six) hours as needed for moderate pain.   lidocaine-prilocaine cream Commonly known as: EMLA Apply 1 application topically daily as needed (dialysis).   pantoprazole 40 MG tablet Commonly known as: PROTONIX Take 40 mg by mouth every evening.   pentafluoroprop-tetrafluoroeth Aero Commonly known as: GEBAUERS Apply 1 application topically as needed (topical anesthesia for hemodialysis).   polyethylene glycol 17 g packet Commonly known as: MIRALAX / GLYCOLAX Take 17 g by mouth daily as needed for mild constipation.   ProAir HFA 108 (90 Base) MCG/ACT inhaler Generic drug: albuterol Inhale 2 puffs into the lungs every 6 (six) hours as needed for wheezing or shortness of breath.   saccharomyces boulardii 250 MG capsule Commonly known as: FLORASTOR Take 1 capsule (250 mg total) by mouth 2 (two) times daily. What changed: when to take this   ticagrelor 90 MG Tabs tablet Commonly known as: BRILINTA Take 90 mg by mouth 2 (two) times daily.      Follow-up Information    Martinique, Sarah T, MD Follow up in 1 week(s).   Specialty: Family Medicine Why: I have placed ambulatory referral to podiatry for nail care Contact information: [place] [place] [address] [phone]          Allergies  Allergen Reactions  . Crestor [Rosuvastatin Calcium] Other (See Comments)    Leg pain     Consultations:  vasc surgery, neph   Procedures/Studies: Dg Foot Complete Left  Result Date: 06/25/2019 CLINICAL DATA:  Left foot pain/infection. Left second and third toe amputations. EXAM: LEFT FOOT - COMPLETE 3+ VIEW COMPARISON:  Foot radiograph June 27, 2011; 09/29/2017 FINDINGS: Flexion at the first IP joint. Patient status post distal amputation of the second and third digits. Proximal most aspect of the proximal phalanx of the second digit and a portion of the proximal phalanx of the third digit remain. No evidence for acute fracture or dislocation. Midfoot degenerative changes. Posterior and plantar calcaneal spurring. Soft tissue swelling about the foot. Vascular calcifications. IMPRESSION: Patient status post amputation of the majority of the second and third digits as above. No evidence for acute osseous abnormality. Electronically Signed   By: Lovey Newcomer M.D.   On: 06/25/2019 18:13   Vas [place] Burnard Bunting With/wo Tbi  Result Date: 06/26/2019 LOWER EXTREMITY DOPPLER STUDY Indications: Peripheral artery disease.  Vascular Interventions: 01/05/2019 - R BKA. Comparison Study: 01/20/2018 - R 0.57 L  0.95 Performing Technologist: Carlos Levering Rvt  Examination Guidelines: A complete evaluation includes at minimum, Doppler waveform signals and systolic blood pressure reading at the level of bilateral brachial, anterior tibial, and posterior tibial arteries, when vessel segments are accessible. Bilateral testing is considered an integral part of a complete examination. Photoelectric Plethysmograph (PPG) waveforms and toe systolic pressure readings are included as required and additional duplex testing as needed. Limited examinations for reoccurring indications may be performed as noted.  ABI Findings: +--------+------------------+-----+---------+--------+ Right   Rt Pressure (mmHg)IndexWaveform Comment  +--------+------------------+-----+---------+--------+ [id number]                     triphasic         +--------+------------------+-----+---------+--------+ PTA                                     BKA      +--------+------------------+-----+---------+--------+ DP                                      BKA      +--------+------------------+-----+---------+--------+ +--------+------------------+-----+--------+------------------+ Left    Lt Pressure (mmHg)IndexWaveformComment            +--------+------------------+-----+--------+------------------+ Brachial                               AVF                +--------+------------------+-----+--------+------------------+ PTA                                    Unable to insonate +--------+------------------+-----+--------+------------------+ DP      39                0.29                            +--------+------------------+-----+--------+------------------+ +-------+-----------+-----------+------------+------------+ ABI/TBIToday's ABIToday's TBIPrevious ABIPrevious TBI +-------+-----------+-----------+------------+------------+ Right                        0.57                     +-------+-----------+-----------+------------+------------+ Left   0.29                  0.95                     +-------+-----------+-----------+------------+------------+  Summary: Right: BKA. Left: Resting left ankle-brachial index indicates critical left limb ischemia. Unable to obtain Posterior tibial artery signal. Unable to obtain TBI due to low amplitude waveforms.  *See table(s) above for measurements and observations.  Electronically signed by Deitra Mayo MD on 06/26/2019 at 6:50:30 PM.   Final        Subjective: Patient did well overnight no acute events resting comfortably. Able for discharge  Discharge Exam: Vitals:   06/27/19 2100 06/28/19 0527  BP: (!) 127/47 (!) 120/52  Pulse: 92 (!) 105  Resp: 16 17  Temp: 98.4 F (36.9 C) 98.3 F (36.8 C)  SpO2: 97% 97%   Vitals:    06/27/19 1612 06/27/19 1636 06/27/19 2100 06/28/19 0527  BP: (!) 134/59 129/60 [name])  127/47 (!) 120/52  Pulse: 92 94 92 (!) 105  Resp: 16 18 16 17   Temp: (!) 97.3 F (36.3 C) 97.6 F (36.4 C) 98.4 F (36.9 C) 98.3 F (36.8 C)  TempSrc: Oral Oral Oral Oral  SpO2: 96% 97% 97% 97%  Weight: 79.6 kg     Height:        General: Pt is alert, awake, not in acute distress Cardiovascular: RRR, S1/S2 +, no rubs, no gallops Respiratory: CTA bilaterally, no wheezing, no rhonchi Abdominal: Soft, NT, ND, bowel sounds + Extremities: no edema, no cyanosis obvious toe amputation and lower extremity amputation, no draining decreased erythema no pain    The results of significant diagnostics from this hospitalization (including imaging, microbiology, ancillary and laboratory) are listed below for reference.     Microbiology: Recent Results (from the past 240 hour(s))  Culture, blood (Routine x 2)     Status: None (Preliminary result)   Collection Time: 06/25/19  5:20 PM   Specimen: BLOOD  Result Value Ref Range Status   Specimen Description BLOOD RIGHT ANTECUBITAL  Final   Special Requests   Final    BOTTLES DRAWN AEROBIC AND ANAEROBIC Blood Culture adequate volume   Culture   Final    NO GROWTH 3 DAYS Performed at [facility], [address] [place], [place] [address]    Report Status PENDING  Incomplete  SARS CORONAVIRUS 2 (TAT 6-24 HRS) Nasopharyngeal Nasopharyngeal Swab     Status: None   Collection Time: 06/26/19 12:15 AM   Specimen: Nasopharyngeal Swab  Result Value Ref Range Status   SARS Coronavirus 2 NEGATIVE NEGATIVE Final    Comment: (NOTE) SARS-CoV-2 target nucleic acids are NOT DETECTED. The SARS-CoV-2 RNA is generally detectable in upper and lower respiratory specimens during the acute phase of infection. Negative results do not preclude SARS-CoV-2 infection, do not rule out co-infections with other pathogens, and should not be used as the sole basis for treatment  or other patient management decisions. Negative results must be combined with clinical observations, patient history, and epidemiological information. The expected result is Negative. Fact Sheet for Patients: SugarRoll.be Fact Sheet for Healthcare Providers: https://www.woods-mathews.com/ This test is not yet approved or cleared by the [place] FDA and  has been authorized for detection and/or diagnosis of SARS-CoV-2 by FDA under an Emergency Use Authorization (EUA). This EUA will remain  in effect (meaning this test can be used) for the duration of the COVID-19 declaration under Section 56 4(b)(1) of the Act, 21 U.S.C. section 360bbb-3(b)(1), unless the authorization is terminated or revoked sooner. Performed at [facility], [place] [address] [place], [place] [address]   Culture, blood (Routine x 2)     Status: None (Preliminary result)   Collection Time: 06/26/19  3:41 AM   Specimen: BLOOD  Result Value Ref Range Status   Specimen Description BLOOD RIGHT HAND  Final   Special Requests   Final    BOTTLES DRAWN AEROBIC AND ANAEROBIC Blood Culture results may not be optimal due to an inadequate volume of blood received in culture bottles   Culture   Final    NO GROWTH 2 DAYS Performed at [facility], [place] [address] [place], [place] [address]    Report Status PENDING  Incomplete  MRSA PCR Screening     Status: Abnormal   Collection Time: 06/26/19  8:00 AM   Specimen: Nasal Mucosa; Nasopharyngeal  Result Value Ref Range Status   MRSA by  PCR POSITIVE (A) NEGATIVE Final    Comment:        The GeneXpert MRSA Assay (FDA approved for NASAL specimens only), is one component of a comprehensive MRSA colonization surveillance program. It is not intended to diagnose MRSA infection nor to guide or monitor treatment for MRSA infections. RESULT CALLED TO, READ BACK BY AND VERIFIED WITH: Lieutenant Diego RN 9:25 06/26/19  ([email or web address]) Performed at [facility], [place] [address] [place], [place] [address]      Labs: BNP (last 3 results) No results for input(s): BNP in the last 8760 hours. Basic Metabolic Panel: Recent Labs  Lab 06/25/19 1712 06/26/19 0334 06/27/19 1230  NA 135 135 134*  K 3.3* 3.7 3.1*  CL 93* 95* 95*  CO2 26 22 23   GLUCOSE 113* 83 101*  BUN 18 27* 53*  CREATININE 2.26* 3.07* 4.15*  CALCIUM 8.8* 8.9 8.3*  PHOS  --   --  5.9*   Liver Function Tests: Recent Labs  Lab 06/25/19 1712 06/27/19 1230  AST 11*  --   ALT 8  --   ALKPHOS 147*  --   BILITOT 0.9  --   PROT 8.4*  --   ALBUMIN 3.6 2.9*   No results for input(s): LIPASE, AMYLASE in the last 168 hours. No results for input(s): AMMONIA in the last 168 hours. CBC: Recent Labs  Lab 06/25/19 1712 06/26/19 0334 06/28/19 0803  WBC 14.5* 14.5* 10.9*  NEUTROABS 11.7*  --  7.7  HGB 15.5* 15.0 13.7  HCT 46.9* 47.9* 42.7  MCV 96.3 99.4 95.5  PLT 760* 746* 649*   Cardiac Enzymes: No results for input(s): CKTOTAL, CKMB, CKMBINDEX, TROPONINI in the last 168 hours. BNP: Invalid input(s): POCBNP CBG: No results for input(s): GLUCAP in the last 168 hours. D-Dimer No results for input(s): DDIMER in the last 72 hours. Hgb A1c Recent Labs    06/26/19 1122  HGBA1C 5.8*   Lipid Profile No results for input(s): CHOL, HDL, LDLCALC, TRIG, CHOLHDL, LDLDIRECT in the last 72 hours. Thyroid function studies No results for input(s): TSH, T4TOTAL, T3FREE, THYROIDAB in the last 72 hours.  Invalid input(s): FREET3 Anemia work up No results for input(s): VITAMINB12, FOLATE, FERRITIN, TIBC, IRON, RETICCTPCT in the last 72 hours. Urinalysis    Component Value Date/Time   COLORURINE YELLOW 10/17/2017 1402   APPEARANCEUR HAZY (A) 10/17/2017 1402   LABSPEC 1.013 10/17/2017 1402   PHURINE 5.0 10/17/2017 1402   GLUCOSEU NEGATIVE 10/17/2017 1402   HGBUR NEGATIVE 10/17/2017 1402   BILIRUBINUR NEGATIVE 10/17/2017 1402    KETONESUR 5 (A) 10/17/2017 1402   PROTEINUR NEGATIVE 10/17/2017 1402   NITRITE NEGATIVE 10/17/2017 1402   LEUKOCYTESUR NEGATIVE 10/17/2017 1402   Sepsis Labs Invalid input(s): PROCALCITONIN,  WBC,  LACTICIDVEN Microbiology Recent Results (from the past 240 hour(s))  Culture, blood (Routine x 2)     Status: None (Preliminary result)   Collection Time: 06/25/19  5:20 PM   Specimen: BLOOD  Result Value Ref Range Status   Specimen Description BLOOD RIGHT ANTECUBITAL  Final   Special Requests   Final    BOTTLES DRAWN AEROBIC AND ANAEROBIC Blood Culture adequate volume   Culture   Final    NO GROWTH 3 DAYS Performed at [facility], [place] [address], [place], [place] [address]    Report Status PENDING  Incomplete  SARS CORONAVIRUS 2 (TAT 6-24 HRS) Nasopharyngeal Nasopharyngeal Swab     Status: None   Collection Time: 06/26/19 12:15  AM   Specimen: Nasopharyngeal Swab  Result Value Ref Range Status   SARS Coronavirus 2 NEGATIVE NEGATIVE Final    Comment: (NOTE) SARS-CoV-2 target nucleic acids are NOT DETECTED. The SARS-CoV-2 RNA is generally detectable in upper and lower respiratory specimens during the acute phase of infection. Negative results do not preclude SARS-CoV-2 infection, do not rule out co-infections with other pathogens, and should not be used as the sole basis for treatment or other patient management decisions. Negative results must be combined with clinical observations, patient history, and epidemiological information. The expected result is Negative. Fact Sheet for Patients: SugarRoll.be Fact Sheet for Healthcare Providers: https://www.woods-mathews.com/ This test is not yet approved or cleared by the [place] FDA and  has been authorized for detection and/or diagnosis of SARS-CoV-2 by FDA under an Emergency Use Authorization (EUA). This EUA will remain  in effect (meaning this test can be used) for the duration of  the COVID-19 declaration under Section 56 4(b)(1) of the Act, 21 U.S.C. section 360bbb-3(b)(1), unless the authorization is terminated or revoked sooner. Performed at [facility], [place] [address], [place], [place] [address]   Culture, blood (Routine x 2)     Status: None (Preliminary result)   Collection Time: 06/26/19  3:41 AM   Specimen: BLOOD  Result Value Ref Range Status   Specimen Description BLOOD RIGHT HAND  Final   Special Requests   Final    BOTTLES DRAWN AEROBIC AND ANAEROBIC Blood Culture results may not be optimal due to an inadequate volume of blood received in culture bottles   Culture   Final    NO GROWTH 2 DAYS Performed at [facility], [place] [address] [place], [place] [address]    Report Status PENDING  Incomplete  MRSA PCR Screening     Status: Abnormal   Collection Time: 06/26/19  8:00 AM   Specimen: Nasal Mucosa; Nasopharyngeal  Result Value Ref Range Status   MRSA by PCR POSITIVE (A) NEGATIVE Final    Comment:        The GeneXpert MRSA Assay (FDA approved for NASAL specimens only), is one component of a comprehensive MRSA colonization surveillance program. It is not intended to diagnose MRSA infection nor to guide or monitor treatment for MRSA infections. RESULT CALLED TO, READ BACK BY AND VERIFIED WITH: Lieutenant Diego RN 9:25 06/26/19 ([email or web address]) Performed at [facility], [place] [address], [place], [place] [address]      Time coordinating discharge: Over 30 minutes  SIGNED:   Nicolette Bang, MD  [facility] 06/28/2019, 9:57 AM Pager   If 7PM-7AM, please contact night-coverage [email or web address] Password [email or web address]

## 2019-06-28 NOTE — Progress Notes (Cosign Needed)
[facility] Progress Note   Subjective:  Seen in room. HD yesterday with no issues. Foot improving, says she's going home today.    Objective Vitals:   06/27/19 1612 06/27/19 1636 06/27/19 2100 06/28/19 0527  BP: (!) 134/59 129/60 (!) 127/47 (!) 120/52  Pulse: 92 94 92 (!) 105  Resp: 16 18 16 17   Temp: (!) 97.3 F (36.3 C) 97.6 F (36.4 C) 98.4 F (36.9 C) 98.3 F (36.8 C)  TempSrc: Oral Oral Oral Oral  SpO2: 96% 97% 97% 97%  Weight: 79.6 kg     Height:        Physical Exam General: WNWD NAD  Heart: RRR  Lungs: CTAB  Abdomen: soft NT  Extremities: R BKA; No stump edema; L foot s/p toe amp +swelling/erythema, improving  Dialysis Access: R IJ TDC    Weight change: 1.62 kg   Additional Objective Labs: Basic Metabolic Panel: Recent Labs  Lab 06/25/19 1712 06/26/19 0334 06/27/19 1230  NA 135 135 134*  K 3.3* 3.7 3.1*  CL 93* 95* 95*  CO2 26 22 23   GLUCOSE 113* 83 101*  BUN 18 27* 53*  CREATININE 2.26* 3.07* 4.15*  CALCIUM 8.8* 8.9 8.3*  PHOS  --   --  5.9*   CBC: Recent Labs  Lab 06/25/19 1712 06/26/19 0334 06/28/19 0803  WBC 14.5* 14.5* 10.9*  NEUTROABS 11.7*  --  7.7  HGB 15.5* 15.0 13.7  HCT 46.9* 47.9* 42.7  MCV 96.3 99.4 95.5  PLT 760* 746* 649*   Blood Culture    Component Value Date/Time   SDES BLOOD RIGHT HAND 06/26/2019 0341   SPECREQUEST  06/26/2019 0341    BOTTLES DRAWN AEROBIC AND ANAEROBIC Blood Culture results may not be optimal due to an inadequate volume of blood received in culture bottles   CULT  06/26/2019 0341    NO GROWTH 2 DAYS Performed at [facility], [place] [address], [place], [place] [address]    REPTSTATUS PENDING 06/26/2019 0321     Medications: . vancomycin 1,000 mg (06/27/19 1505)   . amLODipine  5 mg Oral QPM  . apixaban  2.5 mg Oral BID  . atorvastatin  20 mg Oral q1800  . calcium acetate  667 mg Oral BID WC  . carvedilol  25 mg Oral BID WC  . Chlorhexidine Gluconate Cloth  6 each  Topical Q0600  . hydrALAZINE  25 mg Oral BID  . mupirocin ointment  1 application Nasal BID  . pantoprazole  40 mg Oral QPM  . saccharomyces boulardii  250 mg Oral QPM  . ticagrelor  90 mg Oral BID    Dialysis Orders:  [facility] MWF 4H 350/800 EDW 80kg 2K/2.25Ca Heparin 2400 Access TDC  No VDRA No ESA   Assessment/Plan: 1. L foot cellulitis -  Vancomycin per primary. Blood cultures neg to date. Underlying PVD. Vascular following- will need revascularization procedure if foot doesn't continue to improve.  2.  ESRD -  HD MWF. Next HD 10/2. Resume at outpatient center.  3.  Hypertension/volume  - BP adequate. Continue home meds. At dry weight.   4.  Anemia  - No ESA indications  5.  Metabolic bone disease - Ca ok. Not on VDRA. Continue  Ca acetate binder 6.  DM Type 2 -HgbA1c - 5.8%  7.  Nutrition - Renal diet/vitamins   Lynnda Child PA-C [facility] Pager [phone] 06/28/2019,9:49 AM  LOS: 2 days

## 2019-06-28 NOTE — Progress Notes (Signed)
[facility] Progress Note   Subjective:  Seen in room. HD yesterday with no issues. Foot improving, says she's going home today.    Objective Vitals:   06/27/19 1636 06/27/19 2100 06/28/19 0527 06/28/19 1202  BP: 129/60 (!) 127/47 (!) 120/52 (!) 127/97  Pulse: 94 92 (!) 105 92  Resp: 18 16 17 18   Temp: 97.6 F (36.4 C) 98.4 F (36.9 C) 98.3 F (36.8 C) (!) 97.5 F (36.4 C)  TempSrc: Oral Oral Oral Oral  SpO2: 97% 97% 97% 99%  Weight:      Height:        Physical Exam General: WNWD NAD  Heart: RRR  Lungs: CTAB  Abdomen: soft NT  Extremities: R BKA; No stump edema; L foot s/p toe amp +swelling/erythema, improving  Dialysis Access: R IJ TDC    Weight change: 1.62 kg   Additional Objective Labs: Basic Metabolic Panel: Recent Labs  Lab 06/25/19 1712 06/26/19 0334 06/27/19 1230  NA 135 135 134*  K 3.3* 3.7 3.1*  CL 93* 95* 95*  CO2 26 22 23   GLUCOSE 113* 83 101*  BUN 18 27* 53*  CREATININE 2.26* 3.07* 4.15*  CALCIUM 8.8* 8.9 8.3*  PHOS  --   --  5.9*   CBC: Recent Labs  Lab 06/25/19 1712 06/26/19 0334 06/28/19 0803  WBC 14.5* 14.5* 10.9*  NEUTROABS 11.7*  --  7.7  HGB 15.5* 15.0 13.7  HCT 46.9* 47.9* 42.7  MCV 96.3 99.4 95.5  PLT 760* 746* 649*   Blood Culture    Component Value Date/Time   SDES BLOOD RIGHT HAND 06/26/2019 0341   SPECREQUEST  06/26/2019 0341    BOTTLES DRAWN AEROBIC AND ANAEROBIC Blood Culture results may not be optimal due to an inadequate volume of blood received in culture bottles   CULT  06/26/2019 0341    NO GROWTH 2 DAYS Performed at [facility], [place] [address], [place], [place] [address]    REPTSTATUS PENDING 06/26/2019 7106     Medications: . vancomycin 1,000 mg (06/27/19 1505)   . amLODipine  5 mg Oral QPM  . apixaban  2.5 mg Oral BID  . atorvastatin  20 mg Oral q1800  . calcium acetate  667 mg Oral BID WC  . carvedilol  25 mg Oral BID WC  . Chlorhexidine Gluconate Cloth  6 each Topical  Q0600  . hydrALAZINE  25 mg Oral BID  . mupirocin ointment  1 application Nasal BID  . pantoprazole  40 mg Oral QPM  . saccharomyces boulardii  250 mg Oral QPM  . ticagrelor  90 mg Oral BID    Dialysis Orders:  [facility] MWF 4H 350/800 EDW 80kg 2K/2.25Ca Heparin 2400 Access TDC  No VDRA No ESA   Assessment/Plan: 1. L foot cellulitis -  Vancomycin per primary. Blood cultures neg to date. Underlying PVD. Vascular following- will need revascularization procedure if foot doesn't continue to improve.  2.  ESRD -  HD MWF. Next HD 10/2. Resume at outpatient center.  3.  Hypertension/volume  - BP adequate. Continue home meds. At dry weight.   4.  Anemia  - No ESA indications  5.  Metabolic bone disease - Ca ok. Not on VDRA. Continue  Ca acetate binder 6.  DM Type 2 -HgbA1c - 5.8%  7.  Nutrition - Renal diet/vitamins   Lynnda Child PA-C [facility] Pager [phone] 06/28/2019,1:29 PM  LOS: 2 days   Pt seen, examined and agree  w A/P as above.  [name]  MD 06/28/2019, 1:29 PM

## 2019-06-28 NOTE — Progress Notes (Signed)
  Progress Note    06/28/2019 8:32 AM * No surgery found *  Subjective:  No new complaints   Vitals:   06/27/19 2100 06/28/19 0527  BP: (!) 127/47 (!) 120/52  Pulse: 92 (!) 105  Resp: 16 17  Temp: 98.4 F (36.9 C) 98.3 F (36.8 C)  SpO2: 97% 97%   Physical Exam: Lungs:  Non labored Extremities:  L foot cellulitis resolved; L GT still somewhat dusky; foot warm to touch Neurologic: A&O  CBC    Component Value Date/Time   WBC 10.9 (H) 06/28/2019 0803   RBC 4.47 06/28/2019 0803   HGB 13.7 06/28/2019 0803   HGB 14.3 12/14/2016 1139   HCT 42.7 06/28/2019 0803   HCT 44.2 12/14/2016 1139   PLT 649 (H) 06/28/2019 0803   PLT 429 (H) 12/14/2016 1139   MCV 95.5 06/28/2019 0803   MCV 88.5 12/14/2016 1139   MCH 30.6 06/28/2019 0803   MCHC 32.1 06/28/2019 0803   RDW 14.8 06/28/2019 0803   RDW 17.6 (H) 12/14/2016 1139   LYMPHSABS 1.6 06/28/2019 0803   LYMPHSABS 1.9 12/14/2016 1139   MONOABS 1.2 (H) 06/28/2019 0803   MONOABS 0.7 12/14/2016 1139   EOSABS 0.3 06/28/2019 0803   EOSABS 0.0 10/17/2017 1430   BASOSABS 0.1 06/28/2019 0803   BASOSABS 0.1 12/14/2016 1139    BMET    Component Value Date/Time   NA 134 (L) 06/27/2019 1230   NA 137 12/14/2016 1139   K 3.1 (L) 06/27/2019 1230   K 4.2 12/14/2016 1139   CL 95 (L) 06/27/2019 1230   CO2 23 06/27/2019 1230   CO2 20 (L) 12/14/2016 1139   GLUCOSE 101 (H) 06/27/2019 1230   GLUCOSE 115 12/14/2016 1139   BUN 53 (H) 06/27/2019 1230   BUN 32.8 (H) 12/14/2016 1139   CREATININE 4.15 (H) 06/27/2019 1230   CREATININE 2.1 (H) 12/14/2016 1139   CALCIUM 8.3 (L) 06/27/2019 1230   CALCIUM 8.3 (L) 11/11/2017 0740   CALCIUM 9.5 12/14/2016 1139   GFRNONAA 11 (L) 06/27/2019 1230   GFRAA 12 (L) 06/27/2019 1230    INR    Component Value Date/Time   INR 1.2 06/25/2019 1712     Intake/Output Summary (Last 24 hours) at 06/28/2019 7209 Last data filed at 06/27/2019 1612 Gross per 24 hour  Intake -  Output 1000 ml  Net -1000 ml      Assessment/Plan:  63 y.o. female with L SFA CTO; L foot cellulitis  L foot cellulitis continues to improve with IV antibiotics Cultures still pending; patient will likely need IV antibiotics with dialysis for a period of time regardless If L foot worsens, she will need femoral to BK popliteal bypass Ok for discharge when culture results   Dagoberto Ligas, PA-C [facility] [phone] 06/28/2019 8:32 AM

## 2019-06-28 NOTE — Progress Notes (Signed)
Pt requesting influenza vaccine prior to discharge  Attempted to place order, warning states to contact MD since discharge reconciliation has already been completed Informed MD via secure chat

## 2019-06-30 LAB — CULTURE, BLOOD (ROUTINE X 2)
Culture: NO GROWTH
Special Requests: ADEQUATE

## 2019-07-01 LAB — CULTURE, BLOOD (ROUTINE X 2): Culture: NO GROWTH

## 2019-07-24 ENCOUNTER — Ambulatory Visit: Payer: Medicare HMO | Admitting: Podiatry

## 2019-07-30 ENCOUNTER — Other Ambulatory Visit: Payer: Self-pay

## 2019-07-30 DIAGNOSIS — N186 End stage renal disease: Secondary | ICD-10-CM

## 2019-08-03 ENCOUNTER — Telehealth (HOSPITAL_COMMUNITY): Payer: Self-pay

## 2019-08-03 NOTE — Telephone Encounter (Signed)

## 2019-08-06 ENCOUNTER — Encounter (HOSPITAL_COMMUNITY): Payer: Self-pay

## 2019-08-06 ENCOUNTER — Inpatient Hospital Stay (HOSPITAL_COMMUNITY): Admission: RE | Admit: 2019-08-06 | Payer: Medicare HMO | Source: Ambulatory Visit

## 2019-08-06 ENCOUNTER — Ambulatory Visit: Payer: Medicare HMO | Admitting: Surgery

## 2019-08-06 ENCOUNTER — Emergency Department (HOSPITAL_COMMUNITY): Payer: Medicare HMO

## 2019-08-06 ENCOUNTER — Other Ambulatory Visit: Payer: Self-pay

## 2019-08-06 ENCOUNTER — Inpatient Hospital Stay (HOSPITAL_COMMUNITY)
Admission: EM | Admit: 2019-08-06 | Discharge: 2019-08-29 | DRG: 853 | Disposition: A | Discharge status: Skilled Nursing Facility | Payer: Medicare HMO | Attending: Family Medicine | Admitting: Family Medicine

## 2019-08-06 DIAGNOSIS — Z8673 Personal history of transient ischemic attack (TIA), and cerebral infarction without residual deficits: Secondary | ICD-10-CM

## 2019-08-06 DIAGNOSIS — K219 Gastro-esophageal reflux disease without esophagitis: Secondary | ICD-10-CM | POA: Diagnosis present

## 2019-08-06 DIAGNOSIS — I739 Peripheral vascular disease, unspecified: Secondary | ICD-10-CM | POA: Diagnosis not present

## 2019-08-06 DIAGNOSIS — Z89511 Acquired absence of right leg below knee: Secondary | ICD-10-CM | POA: Diagnosis not present

## 2019-08-06 DIAGNOSIS — E785 Hyperlipidemia, unspecified: Secondary | ICD-10-CM | POA: Diagnosis present

## 2019-08-06 DIAGNOSIS — Y712 Prosthetic and other implants, materials and accessory cardiovascular devices associated with adverse incidents: Secondary | ICD-10-CM | POA: Diagnosis present

## 2019-08-06 DIAGNOSIS — I96 Gangrene, not elsewhere classified: Secondary | ICD-10-CM | POA: Diagnosis not present

## 2019-08-06 DIAGNOSIS — I132 Hypertensive heart and chronic kidney disease with heart failure and with stage 5 chronic kidney disease, or end stage renal disease: Secondary | ICD-10-CM | POA: Diagnosis present

## 2019-08-06 DIAGNOSIS — N186 End stage renal disease: Secondary | ICD-10-CM | POA: Diagnosis present

## 2019-08-06 DIAGNOSIS — Z7951 Long term (current) use of inhaled steroids: Secondary | ICD-10-CM

## 2019-08-06 DIAGNOSIS — E1022 Type 1 diabetes mellitus with diabetic chronic kidney disease: Secondary | ICD-10-CM | POA: Diagnosis not present

## 2019-08-06 DIAGNOSIS — E1140 Type 2 diabetes mellitus with diabetic neuropathy, unspecified: Secondary | ICD-10-CM | POA: Diagnosis present

## 2019-08-06 DIAGNOSIS — E875 Hyperkalemia: Secondary | ICD-10-CM | POA: Diagnosis not present

## 2019-08-06 DIAGNOSIS — L97529 Non-pressure chronic ulcer of other part of left foot with unspecified severity: Secondary | ICD-10-CM | POA: Diagnosis present

## 2019-08-06 DIAGNOSIS — I998 Other disorder of circulatory system: Secondary | ICD-10-CM | POA: Diagnosis present

## 2019-08-06 DIAGNOSIS — Z992 Dependence on renal dialysis: Secondary | ICD-10-CM | POA: Diagnosis not present

## 2019-08-06 DIAGNOSIS — E876 Hypokalemia: Secondary | ICD-10-CM | POA: Diagnosis present

## 2019-08-06 DIAGNOSIS — I9789 Other postprocedural complications and disorders of the circulatory system, not elsewhere classified: Secondary | ICD-10-CM | POA: Diagnosis not present

## 2019-08-06 DIAGNOSIS — D472 Monoclonal gammopathy: Secondary | ICD-10-CM | POA: Diagnosis present

## 2019-08-06 DIAGNOSIS — E7800 Pure hypercholesterolemia, unspecified: Secondary | ICD-10-CM | POA: Diagnosis present

## 2019-08-06 DIAGNOSIS — Z87442 Personal history of urinary calculi: Secondary | ICD-10-CM

## 2019-08-06 DIAGNOSIS — N189 Chronic kidney disease, unspecified: Secondary | ICD-10-CM | POA: Diagnosis not present

## 2019-08-06 DIAGNOSIS — Z888 Allergy status to other drugs, medicaments and biological substances status: Secondary | ICD-10-CM

## 2019-08-06 DIAGNOSIS — N2581 Secondary hyperparathyroidism of renal origin: Secondary | ICD-10-CM | POA: Diagnosis not present

## 2019-08-06 DIAGNOSIS — Z993 Dependence on wheelchair: Secondary | ICD-10-CM

## 2019-08-06 DIAGNOSIS — T82510A Breakdown (mechanical) of surgically created arteriovenous fistula, initial encounter: Secondary | ICD-10-CM | POA: Diagnosis not present

## 2019-08-06 DIAGNOSIS — F05 Delirium due to known physiological condition: Secondary | ICD-10-CM | POA: Diagnosis not present

## 2019-08-06 DIAGNOSIS — D631 Anemia in chronic kidney disease: Secondary | ICD-10-CM | POA: Diagnosis present

## 2019-08-06 DIAGNOSIS — J189 Pneumonia, unspecified organism: Secondary | ICD-10-CM

## 2019-08-06 DIAGNOSIS — I70222 Atherosclerosis of native arteries of extremities with rest pain, left leg: Secondary | ICD-10-CM | POA: Diagnosis present

## 2019-08-06 DIAGNOSIS — R443 Hallucinations, unspecified: Secondary | ICD-10-CM | POA: Diagnosis not present

## 2019-08-06 DIAGNOSIS — N184 Chronic kidney disease, stage 4 (severe): Secondary | ICD-10-CM | POA: Diagnosis present

## 2019-08-06 DIAGNOSIS — G8929 Other chronic pain: Secondary | ICD-10-CM | POA: Diagnosis present

## 2019-08-06 DIAGNOSIS — A419 Sepsis, unspecified organism: Secondary | ICD-10-CM | POA: Diagnosis present

## 2019-08-06 DIAGNOSIS — E872 Acidosis: Secondary | ICD-10-CM | POA: Diagnosis present

## 2019-08-06 DIAGNOSIS — E1122 Type 2 diabetes mellitus with diabetic chronic kidney disease: Secondary | ICD-10-CM | POA: Diagnosis present

## 2019-08-06 DIAGNOSIS — I5043 Acute on chronic combined systolic (congestive) and diastolic (congestive) heart failure: Secondary | ICD-10-CM | POA: Diagnosis present

## 2019-08-06 DIAGNOSIS — I2510 Atherosclerotic heart disease of native coronary artery without angina pectoris: Secondary | ICD-10-CM | POA: Diagnosis present

## 2019-08-06 DIAGNOSIS — L03116 Cellulitis of left lower limb: Secondary | ICD-10-CM | POA: Diagnosis present

## 2019-08-06 DIAGNOSIS — M1990 Unspecified osteoarthritis, unspecified site: Secondary | ICD-10-CM | POA: Diagnosis present

## 2019-08-06 DIAGNOSIS — M869 Osteomyelitis, unspecified: Secondary | ICD-10-CM | POA: Diagnosis present

## 2019-08-06 DIAGNOSIS — Z20828 Contact with and (suspected) exposure to other viral communicable diseases: Secondary | ICD-10-CM | POA: Diagnosis present

## 2019-08-06 DIAGNOSIS — Z8249 Family history of ischemic heart disease and other diseases of the circulatory system: Secondary | ICD-10-CM

## 2019-08-06 DIAGNOSIS — T82898A Other specified complication of vascular prosthetic devices, implants and grafts, initial encounter: Secondary | ICD-10-CM | POA: Diagnosis not present

## 2019-08-06 DIAGNOSIS — Z86718 Personal history of other venous thrombosis and embolism: Secondary | ICD-10-CM

## 2019-08-06 DIAGNOSIS — Z95820 Peripheral vascular angioplasty status with implants and grafts: Secondary | ICD-10-CM

## 2019-08-06 DIAGNOSIS — J45909 Unspecified asthma, uncomplicated: Secondary | ICD-10-CM | POA: Diagnosis present

## 2019-08-06 DIAGNOSIS — Z8619 Personal history of other infectious and parasitic diseases: Secondary | ICD-10-CM

## 2019-08-06 DIAGNOSIS — Z7901 Long term (current) use of anticoagulants: Secondary | ICD-10-CM

## 2019-08-06 DIAGNOSIS — Z79899 Other long term (current) drug therapy: Secondary | ICD-10-CM

## 2019-08-06 DIAGNOSIS — I959 Hypotension, unspecified: Secondary | ICD-10-CM | POA: Diagnosis not present

## 2019-08-06 DIAGNOSIS — I70262 Atherosclerosis of native arteries of extremities with gangrene, left leg: Secondary | ICD-10-CM | POA: Diagnosis not present

## 2019-08-06 DIAGNOSIS — G4733 Obstructive sleep apnea (adult) (pediatric): Secondary | ICD-10-CM | POA: Diagnosis present

## 2019-08-06 DIAGNOSIS — Z01810 Encounter for preprocedural cardiovascular examination: Secondary | ICD-10-CM | POA: Diagnosis not present

## 2019-08-06 HISTORY — DX: End stage renal disease: Z99.2

## 2019-08-06 HISTORY — DX: End stage renal disease: N18.6

## 2019-08-06 LAB — CBC WITH DIFFERENTIAL/PLATELET
Abs Immature Granulocytes: 0.43 10*3/uL — ABNORMAL HIGH (ref 0.00–0.07)
Basophils Absolute: 0.1 10*3/uL (ref 0.0–0.1)
Basophils Relative: 1 %
Eosinophils Absolute: 0.2 10*3/uL (ref 0.0–0.5)
Eosinophils Relative: 1 %
HCT: 37 % (ref 36.0–46.0)
Hemoglobin: 12 g/dL (ref 12.0–15.0)
Immature Granulocytes: 2 %
Lymphocytes Relative: 6 %
Lymphs Abs: 1.6 10*3/uL (ref 0.7–4.0)
MCH: 30.8 pg (ref 26.0–34.0)
MCHC: 32.4 g/dL (ref 30.0–36.0)
MCV: 94.9 fL (ref 80.0–100.0)
Monocytes Absolute: 1.6 10*3/uL — ABNORMAL HIGH (ref 0.1–1.0)
Monocytes Relative: 6 %
Neutro Abs: 23.4 10*3/uL — ABNORMAL HIGH (ref 1.7–7.7)
Neutrophils Relative %: 84 %
Platelets: 965 10*3/uL (ref 150–400)
RBC: 3.9 MIL/uL (ref 3.87–5.11)
RDW: 16 % — ABNORMAL HIGH (ref 11.5–15.5)
WBC: 27.3 10*3/uL — ABNORMAL HIGH (ref 4.0–10.5)
nRBC: 0 % (ref 0.0–0.2)

## 2019-08-06 LAB — BASIC METABOLIC PANEL
Anion gap: 28 — ABNORMAL HIGH (ref 5–15)
BUN: 102 mg/dL — ABNORMAL HIGH (ref 8–23)
CO2: 10 mmol/L — ABNORMAL LOW (ref 22–32)
Calcium: 7.5 mg/dL — ABNORMAL LOW (ref 8.9–10.3)
Chloride: 95 mmol/L — ABNORMAL LOW (ref 98–111)
Creatinine, Ser: 5.48 mg/dL — ABNORMAL HIGH (ref 0.44–1.00)
GFR calc Af Amer: 9 mL/min — ABNORMAL LOW (ref >60)
GFR calc non Af Amer: 8 mL/min — ABNORMAL LOW (ref >60)
Glucose, Bld: 107 mg/dL — ABNORMAL HIGH (ref 70–99)
Potassium: 3.3 mmol/L — ABNORMAL LOW (ref 3.5–5.1)
Sodium: 133 mmol/L — ABNORMAL LOW (ref 135–145)

## 2019-08-06 LAB — MAGNESIUM: Magnesium: 2.3 mg/dL (ref 1.7–2.4)

## 2019-08-06 LAB — LACTIC ACID, PLASMA: Lactic Acid, Venous: 1.3 mmol/L (ref 0.5–1.9)

## 2019-08-06 LAB — ACETAMINOPHEN LEVEL: Acetaminophen (Tylenol), Serum: 14 ug/mL (ref 10–30)

## 2019-08-06 MED ORDER — PIPERACILLIN-TAZOBACTAM 3.375 G IVPB 30 MIN
3.3750 g | Freq: Once | INTRAVENOUS | Status: AC
  Administered 2019-08-06: 23:00:00 3.375 g via INTRAVENOUS
  Filled 2019-08-06: qty 50

## 2019-08-06 MED ORDER — VANCOMYCIN HCL IN DEXTROSE 750-5 MG/150ML-% IV SOLN
750.0000 mg | INTRAVENOUS | Status: DC
  Administered 2019-08-08 – 2019-08-15 (×6): 750 mg via INTRAVENOUS
  Filled 2019-08-06 (×7): qty 150

## 2019-08-06 MED ORDER — VANCOMYCIN HCL 10 G IV SOLR
1750.0000 mg | Freq: Once | INTRAVENOUS | Status: AC
  Administered 2019-08-07: 1750 mg via INTRAVENOUS
  Filled 2019-08-06: qty 1750

## 2019-08-06 NOTE — Progress Notes (Signed)
Pharmacy Antibiotic Note  Kristen Berry is a 63 y.o. female admitted on 08/06/2019 with cellulitis.  Pharmacy has been consulted for Vancomycin  dosing.  Weight: 167 lb 15.9 oz (76.2 kg)  Temp (24hrs), Avg:98 F (36.7 C), Min:97.7 F (36.5 C), Max:98.3 F (36.8 C)  Recent Labs  Lab 08/06/19 2058  WBC 27.3*  CREATININE 5.48*  LATICACIDVEN 1.3    Estimated Creatinine Clearance: 10.5 mL/min (A) (by C-G formula based on SCr of 5.48 mg/dL (H)).    Allergies  Allergen Reactions  . Crestor [Rosuvastatin Calcium] Other (See Comments)    Leg pain    Antimicrobials this admission: 11/9 Zosyn >>  11/9 Vancomycin >>    Microbiology results: 11/9 BCx: Pending  Plan: - HD patient (MWF)  - Vancomycin 1750 mg IV x 1 dose load - Vancomycin 750 mg IV qHD  - Continue to monitor for descilation as indicated    Thank you for allowing pharmacy to be a part of this patient's care.  Duanne Limerick PharmD. BCPS 08/06/2019 10:29 PM

## 2019-08-06 NOTE — ED Provider Notes (Signed)
[facility]ERGENCY DEPARTMENT Provider Note   CSN: [id number] Arrival date & time: 08/06/19  2007     History   Chief Complaint Chief Complaint  Patient presents with  . Missed Dialysis    x last 3 treatments    HPI Kristen Berry is a 63 y.o. female with h/o DM, HTN, ESRD on HW MWF, PAD s/p R BKA and L 2nd and 3rd toe amputations presents with chief complaint of missing dialysis and left foot pain.  States the driver who has tried to pick her up from home the last week has been very particular about the ramp she has to come down from her house, states he was not comfortable helping her down and so she has been unable to go to dialysis for the last 3 sessions.  Reports very mild shortness of breath when she moves too fast.  No chest pain.  No lower extremity or peripheral edema.  She goes to [facility] dialysis in [place].  Patient reports long history of left great toe wound, in the past there has been an infection there that has needed IV antibiotics.  She has noticed gradual, worsening redness, warmth, pain and odor around the base of her left great toe for the last few days.  Has associated chills but no fever. No modifying factors.      HPI  Past Medical History:  Diagnosis Date  . Anemia   . Arthritis    "hands, knees" (10/10/2017)  . Asthma   . CHF (congestive heart failure) ([place])   . Chronic kidney disease (CKD), stage IV (severe) ([place])    Dialysis T/ Th/ Sat  . Coronary artery disease   . H/O Clostridium difficile infection   . High cholesterol   . History of kidney stones   . Hypertension   . PVD (peripheral vascular disease) ([place])    "LLE; will have OR" (10/10/2017)  . Sleep apnea    "never given mask" (10/10/2017)  . Stroke [facility])    mild stroke mid April - 2019  . Type II diabetes mellitus ([place])    "no RX anymore" (10/10/2017)    Patient Active Problem List   Diagnosis Date Noted  . Sepsis ([place]) 08/06/2019  . MRSA (methicillin resistant  Staphylococcus aureus) 06/26/2019  . Cellulitis of left foot 06/25/2019  . Anemia in chronic kidney disease 01/29/2019  . Complete traumatic amputation at level between knee and ankle, unspecified lower leg, initial encounter ([place]) 01/12/2019  . Below knee amputation ([place])   . S/P BKA (below knee amputation), right ([place]) 01/05/2019  . ESRD on hemodialysis ([place])   . Coronary artery disease without angina pectoris   . Cellulitis 01/02/2019  . Gangrene of right foot ([place])   . Right foot infection 12/21/2018  . Unspecified open wound of right great toe without damage to nail, initial encounter 12/14/2018  . Diarrhea, unspecified 05/31/2018  . PAD (peripheral artery disease) ([place]) 01/24/2018  . Peripheral artery disease ([place]) 01/24/2018  . Anticoagulated 01/13/2018  . Rib pain 01/13/2018  . Transient cerebral ischemic attack, unspecified 01/13/2018  . C. difficile diarrhea 01/06/2018  . Hypokalemia 01/06/2018  . ESRD (end stage renal disease) ([place]) 01/06/2018  . Prolonged QT interval 01/06/2018  . Hypotension 01/06/2018  . Multiple rib fractures 01/06/2018  . Encounter for immunization 11/22/2017  . Iron deficiency anemia, unspecified 11/21/2017  . Coagulation defect, unspecified ([place]) 11/17/2017  . Hypertensive heart and chronic kidney disease without heart failure, with stage 1 through  stage 4 chronic kidney disease, or unspecified chronic kidney disease 11/17/2017  . Hypoglycemia, unspecified 11/17/2017  . Pain, unspecified 11/17/2017  . Personal history of other venous thrombosis and embolism 11/17/2017  . Presence of other vascular implants and grafts 11/17/2017  . Pruritus, unspecified 11/17/2017  . Secondary hyperparathyroidism of renal origin ([place]) 11/17/2017  . Type 2 diabetes mellitus with diabetic neuropathy, unspecified ([place]) 11/17/2017  . Pressure injury of skin 11/08/2017  . Coronary artery disease due to lipid rich plaque   . Chest pain   . Acute on chronic systolic  CHF (congestive heart failure), NYHA class 3 ([place]) 10/18/2017  . PICC (peripherally inserted central catheter) in place   . Acute osteomyelitis of toe of left foot ([place])   . Essential hypertension 12/14/2016  . Abnormal bone xray 06/15/2016  . MGUS (monoclonal gammopathy of unknown significance) 05/18/2016  . Stage 4 chronic kidney disease ([place]) 05/18/2016  . CHF (congestive heart failure) ([place]) 05/18/2016  . Renal insufficiency 06/28/2013  . Other and unspecified hyperlipidemia 06/28/2013  . Obesity, unspecified 06/28/2013  . Pain in limb 06/28/2013  . Diabetes mellitus type 2, insulin dependent ([place]) 06/28/2013    Past Surgical History:  Procedure Laterality Date  . A/V FISTULAGRAM N/A 12/27/2018   Procedure: A/V FISTULAGRAM - Left Upper;  Surgeon: Serafina Mitchell, MD;  Location: [facility];  Service: Cardiovascular;  Laterality: N/A;  . A/V FISTULAGRAM Left 05/22/2019   Procedure: A/V FISTULAGRAM;  Surgeon: Serafina Mitchell, MD;  Location: [facility];  Service: Cardiovascular;  Laterality: Left;  . AMPUTATION Left 11/10/2017   Procedure: AMPUTATION DIGIT SECOND TOE LEFT FOOT;  Surgeon: Waynetta Sandy, MD;  Location: [facility];  Service: Vascular;  Laterality: Left;  . AMPUTATION Left 01/24/2018   Procedure: AMPUTATION THIRD TOE LEFT FOOT;  Surgeon: Waynetta Sandy, MD;  Location: [facility];  Service: Vascular;  Laterality: Left;  . AMPUTATION Right 12/22/2018   Procedure: Right foot 1st ray amputation;  Surgeon: Newt Minion, MD;  Location: [facility];  Service: Orthopedics;  Laterality: Right;  . AMPUTATION Right 01/05/2019   Procedure: RIGHT BELOW KNEE AMPUTATION;  Surgeon: Newt Minion, MD;  Location: [facility];  Service: Orthopedics;  Laterality: Right;  . AORTOGRAM N/A 11/03/2017   Procedure: Ultrasound Guided Cannulation Right Common Femoral Artery;  Aortagram with Left Lower Extremity Arteriogram; Attempted Treatment Left Superficial Femoral Artery;  Percutaneous Closure Right Common Femoral Arteriotomy with Proglide Device;  Surgeon: Waynetta Sandy, MD;  Location: [facility];  Service: Vascular;  Laterality: N/A;  . APPLICATION OF WOUND VAC Right 01/05/2019   Procedure: Application Of Wound Vac;  Surgeon: Newt Minion, MD;  Location: [facility];  Service: Orthopedics;  Laterality: Right;  . AV FISTULA PLACEMENT Left 11/10/2017   Procedure: ARTERIOVENOUS (AV) FISTULA CREATION LEFT UPPER ARM;  Surgeon: Waynetta Sandy, MD;  Location: [facility];  Service: Vascular;  Laterality: Left;  . EXCHANGE OF A DIALYSIS CATHETER Right 02/15/2019   Procedure: EXCHANGE OF A TUNNELED DIALYSIS CATHETER;  Surgeon: Serafina Mitchell, MD;  Location: [facility];  Service: Vascular;  Laterality: Right;  . FISTULA SUPERFICIALIZATION Left 02/15/2019   Procedure: SUPERFICIALIZATION WITH BRANCH LIGATION BRACHIOCEPHALIC ARTERIOVENOUS FISTULA LEFT ARM;  Surgeon: Serafina Mitchell, MD;  Location: [facility];  Service: Vascular;  Laterality: Left;  . HERNIA REPAIR  1950s  . INSERTION OF DIALYSIS CATHETER Right 11/10/2017   Procedure: INSERTION OF TUNNELED DIALYSIS CATHETER RIGHT INTERNAL JUGULAR PLACEMENT;  Surgeon: Donzetta Matters,  Georgia Dom, MD;  Location: [facility];  Service: Vascular;  Laterality: Right;  . IR FLUORO GUIDE CV LINE RIGHT  10/19/2017  . IR US GUIDE VASC ACCESS RIGHT  10/19/2017  . LEFT HEART CATH AND CORONARY ANGIOGRAPHY N/A 11/01/2017   Procedure: LEFT HEART CATH AND CORONARY ANGIOGRAPHY;  Surgeon: Belva Crome, MD;  Location: [facility];  Service: Cardiovascular;  Laterality: N/A;  . LOWER EXTREMITY ANGIOGRAM Left 02/15/2019   Procedure: ANGIOGRAM LEFT LOWER EXTREMITY;  Surgeon: Serafina Mitchell, MD;  Location: [facility];  Service: Vascular;  Laterality: Left;  . LOWER EXTREMITY ANGIOGRAPHY N/A 12/27/2018   Procedure: LOWER EXTREMITY ANGIOGRAPHY - Right Lower;  Surgeon: Serafina Mitchell, MD;  Location: [facility];  Service: Cardiovascular;  Laterality: N/A;   . PERIPHERAL VASCULAR BALLOON ANGIOPLASTY Left 05/22/2019   Procedure: PERIPHERAL VASCULAR BALLOON ANGIOPLASTY;  Surgeon: Serafina Mitchell, MD;  Location: [facility];  Service: Cardiovascular;  Laterality: Left;  fistula  . PERIPHERAL VASCULAR INTERVENTION Left 11/07/2017   Procedure: PERIPHERAL VASCULAR INTERVENTION;  Surgeon: Waynetta Sandy, MD;  Location: [facility];  Service: Cardiovascular;  Laterality: Left;  left SFA  . PERIPHERAL VASCULAR INTERVENTION Right 12/27/2018   Procedure: PERIPHERAL VASCULAR INTERVENTION;  Surgeon: Serafina Mitchell, MD;  Location: [facility];  Service: Cardiovascular;  Laterality: Right;  SFA  . SHOULDER OPEN ROTATOR CUFF REPAIR Left   . TUBAL LIGATION    . ULTRASOUND GUIDANCE FOR VASCULAR ACCESS  11/01/2017   Procedure: Ultrasound Guidance For Vascular Access;  Surgeon: Belva Crome, MD;  Location: [facility];  Service: Cardiovascular;;  . ULTRASOUND GUIDANCE FOR VASCULAR ACCESS Right 02/15/2019   Procedure: Ultrasound Guidance For Vascular Access;  Surgeon: Serafina Mitchell, MD;  Location: [facility] OR;  Service: Vascular;  Laterality: Right;     OB History   No obstetric history on file.      Home Medications    Prior to Admission medications   Medication Sig Start Date End Date Taking? Authorizing Provider  acetaminophen (TYLENOL) 500 MG tablet Take 2,000 mg by mouth every 4 (four) hours as needed (pain).    Yes [provider]  albuterol (PROAIR HFA) 108 (90 Base) MCG/ACT inhaler Inhale 2 puffs into the lungs every 6 (six) hours as needed for wheezing or shortness of breath.    Yes [provider]  amLODipine (NORVASC) 5 MG tablet Take 5 mg by mouth at bedtime.    Yes [provider]  apixaban (ELIQUIS) 2.5 MG TABS tablet Take 1 tablet (2.5 mg total) by mouth 2 (two) times daily. 12/30/18  Yes Bonnita Hollow, MD  atorvastatin (LIPITOR) 20 MG tablet Take 1 tablet (20 mg total) by mouth daily  at 6 PM. Patient taking differently: Take 20 mg by mouth at bedtime.  11/16/17  Yes Domenic Polite, MD  calcium acetate (PHOSLO) 667 MG capsule Take 667 mg by mouth See admin instructions. Take one capsule (667 mg) by mouth up to three times daily with meals 10/27/18  Yes [provider]  carvedilol (COREG) 25 MG tablet Take 25 mg by mouth 2 (two) times daily. 08/26/18  Yes [provider]  clotrimazole-betamethasone (LOTRISONE) cream Apply 1 application topically daily as needed (rash).  01/19/18  Yes [provider]  diphenoxylate-atropine (LOMOTIL) 2.5-0.025 MG tablet Take 1 tablet by mouth every 12 (twelve) hours as needed for diarrhea or loose stools.    Yes [provider]  hydrALAZINE (APRESOLINE) 25 MG tablet Take 25 mg by mouth 2 (two) times daily.  01/25/18  Yes Rhyne, Hulen Shouts, PA-C  Melatonin 5 MG CAPS Take 5 mg by mouth at bedtime. 07/30/19  Yes [provider]  pantoprazole (PROTONIX) 40 MG tablet Take 40 mg by mouth at bedtime.    Yes [provider]  saccharomyces boulardii (FLORASTOR) 250 MG capsule Take 1 capsule (250 mg total) by mouth 2 (two) times daily. Patient taking differently: Take 250 mg by mouth at bedtime.  01/12/18  Yes Caren Griffins, MD  ticagrelor (BRILINTA) 90 MG TABS tablet Take 90 mg by mouth 2 (two) times daily.    Yes [provider]  HYDROcodone-acetaminophen (NORCO) 5-325 MG tablet Take 1 tablet by mouth every 6 (six) hours as needed for moderate pain. Patient not taking: Reported on 08/06/2019 02/15/19   Dagoberto Ligas, PA-C  pentafluoroprop-tetrafluoroeth Landry Dyke) AERO Apply 1 application topically as needed (topical anesthesia for hemodialysis). Patient not taking: Reported on 08/06/2019 12/30/18   Bonnita Hollow, MD  polyethylene glycol [facility] / Floria Raveling) packet Take 17 g by mouth daily as needed for mild constipation. Patient not taking: Reported on 08/06/2019 12/30/18   Bonnita Hollow, MD     Family History Family History  Problem Relation Age of Onset  . Hypertension Father   . Heart failure Maternal Grandmother   . Heart failure Maternal Grandfather     Social History Social History   Tobacco Use  . Smoking status: Never Smoker  . Smokeless tobacco: Never Used  Substance Use Topics  . Alcohol use: No  . Drug use: No     Allergies   Crestor [rosuvastatin calcium]   Review of Systems Review of Systems  Respiratory: Positive for shortness of breath.   Skin: Positive for color change and wound.  All other systems reviewed and are negative.    Physical Exam Updated Vital Signs BP (!) 140/52   Pulse (!) 120   Temp 98.3 F (36.8 C) (Rectal)   Resp (!) 23   Wt 76.2 kg   SpO2 99%   BMI 25.54 kg/m   Physical Exam Vitals signs and nursing note reviewed.  Constitutional:      General: She is not in acute distress.    Appearance: She is well-developed.     Comments: NAD.  HENT:     Head: Normocephalic and atraumatic.     Right Ear: External ear normal.     Left Ear: External ear normal.     Nose: Nose normal.  Eyes:     General: No scleral icterus.    Conjunctiva/sclera: Conjunctivae normal.  Neck:     Musculoskeletal: Normal range of motion and neck supple.  Cardiovascular:     Rate and Rhythm: Regular rhythm. Tachycardia present.     Heart sounds: Normal heart sounds.     Comments: Heart rate in the 110-120 during exam.  1+ radial and femoral pulses bilaterally. Pulmonary:     Effort: Pulmonary effort is normal.     Breath sounds: Normal breath sounds.     Comments: SPO2 greater than 95% on room air and during conversation.  Speaking in full sentences.  Diminished lung sounds in the lower lobes anteriorly and posteriorly, difficult exam due to body habitus.  No crackles. Musculoskeletal: Normal range of motion.        General: No deformity.  Skin:    General: Skin is warm and dry.     Capillary Refill: Capillary  refill takes less than 2  seconds.     Findings: Erythema present.     Comments: Left great toe has dry gangrene.  There is surrounding erythema, warmth and tenderness.  Pungent odor when entering the room.  No erythema in the proximal foot, ankle or distal extremity.  See photo.  Neurological:     Mental Status: She is alert and oriented to person, place, and time.  Psychiatric:        Behavior: Behavior normal.        Thought Content: Thought content normal.        Judgment: Judgment normal.        ED Treatments / Results  Labs (all labs ordered are listed, but only abnormal results are displayed) Labs Reviewed  CBC WITH DIFFERENTIAL/PLATELET - Abnormal; Notable for the following components:      Result Value   WBC 27.3 (*)    RDW 16.0 (*)    Platelets 965 (*)    Neutro Abs 23.4 (*)    Monocytes Absolute 1.6 (*)    Abs Immature Granulocytes 0.43 (*)    All other components within normal limits  BASIC METABOLIC PANEL - Abnormal; Notable for the following components:   Sodium 133 (*)    Potassium 3.3 (*)    Chloride 95 (*)    CO2 10 (*)    Glucose, Bld 107 (*)    BUN 102 (*)    Creatinine, Ser 5.48 (*)    Calcium 7.5 (*)    GFR calc non Af Amer 8 (*)    GFR calc Af Amer 9 (*)    Anion gap 28 (*)    All other components within normal limits  HEPATIC FUNCTION PANEL - Abnormal; Notable for the following components:   Albumin 2.9 (*)    AST 10 (*)    Alkaline Phosphatase 162 (*)    All other components within normal limits  CULTURE, BLOOD (ROUTINE X 2)  CULTURE, BLOOD (ROUTINE X 2)  SARS CORONAVIRUS 2 (TAT 6-24 HRS)  MAGNESIUM  LACTIC ACID, PLASMA  ACETAMINOPHEN LEVEL  LACTIC ACID, PLASMA    EKG EKG Interpretation  Date/Time:  Monday August 06 2019 20:17:44 EST Ventricular Rate:  115 PR Interval:    QRS Duration: 102 QT Interval:  355 QTC Calculation: 491 R Axis:   75 Text Interpretation: Sinus tachycardia Prolonged PR interval Probable left atrial enlargement Borderline T  abnormalities, diffuse leads Borderline prolonged QT interval Confirmed by Davonna Belling [phone]) on 08/06/2019 9:20:40 PM   Radiology Dg Chest 2 View  Result Date: 08/06/2019 CLINICAL DATA:  Shortness of breath.  Missed dialysis. EXAM: CHEST - 2 VIEW COMPARISON:  Feb 15, 2019 FINDINGS: There is a well-positioned tunneled dialysis catheter. There is cardiomegaly with vascular congestion and early pulmonary edema. No large pleural effusion. There is no focal infiltrate. No acute osseous abnormality. IMPRESSION: 1. Cardiomegaly with vascular congestion and possible early edema. 2. Well-positioned tunneled dialysis catheter. Electronically Signed   By: Constance Holster M.D.   On: 08/06/2019 22:18   Dg Foot Complete Left  Result Date: 08/06/2019 CLINICAL DATA:  Acute on chronic pain in left great toe. Chronic wound with positive erythema and warmth. EXAM: LEFT FOOT - COMPLETE 3+ VIEW COMPARISON:  06/17/2019 FINDINGS: Findings are consistent with osteomyelitis involving the proximal phalanx of the first digit. There is subtle lucencies involving the medial aspect of the first metatarsal head. Pockets of subcutaneous gas and soft tissue swelling is noted. There is a  lucency involving the tuft of the distal phalanx of the first digit. The patient is status post amputation of the second and third digits as before. There is a large plantar calcaneal spur. There is an Achilles tendon enthesophyte. IMPRESSION: 1. Overall findings consistent with osteomyelitis of the proximal and distal phalanges of the first digit. 2. Questionable osteomyelitis involving the head of the first metatarsal. This can be further evaluated with a contrast enhanced MRI. 3. Soft tissue infection with multiple pockets of subcutaneous gas. Electronically Signed   By: Constance Holster M.D.   On: 08/06/2019 22:16    Procedures .Critical Care Performed by: Kinnie Feil, PA-C Authorized by: Kinnie Feil, PA-C   Critical  care provider statement:    Critical care time (minutes):  45   Critical care was necessary to treat or prevent imminent or life-threatening deterioration of the following conditions:  Sepsis   Critical care was time spent personally by me on the following activities:  Discussions with consultants, evaluation of patient's response to treatment, examination of patient, ordering and performing treatments and interventions, ordering and review of laboratory studies, ordering and review of radiographic studies, pulse oximetry, re-evaluation of patient's condition, obtaining history from patient or surrogate, review of old charts and development of treatment plan with patient or surrogate   I assumed direction of critical care for this patient from another provider in my specialty: no     (including critical care time)  Medications Ordered in ED Medications  vancomycin (VANCOCIN) 1,750 mg in sodium chloride 0.9 % 500 mL IVPB (has no administration in time range)  vancomycin (VANCOCIN) IVPB 750 mg/150 ml premix (has no administration in time range)  acetaminophen (TYLENOL) tablet 1,000 mg (has no administration in time range)  piperacillin-tazobactam (ZOSYN) IVPB 3.375 g (3.375 g Intravenous New Bag/Given 08/06/19 2312)     Initial Impression / Assessment and Plan / ED Course  I have reviewed the triage vital signs and the nursing notes.  Pertinent labs & imaging results that were available during my care of the patient were reviewed by me and considered in my medical decision making (see chart for details).  Clinical Course as of Aug 06 28  [facility] Aug 06, 2019  2121 Sinus tachycardia Prolonged PR interval Probable left atrial enlargement Borderline T abnormalities, diffuse leads Borderline prolonged QT interval  EKG 12-Lead [CG]  2221 WBC(!): 27.3 [CG]  2221 1. Overall findings consistent with osteomyelitis of the proximal and distal phalanges of the first digit. 2. Questionable  osteomyelitis involving the head of the first metatarsal. This can be further evaluated with a contrast enhanced MRI. 3. Soft tissue infection with multiple pockets of subcutaneous gas.   [CG]  2222 Platelets(!!): 965 [CG]  2222 NEUT#(!): 23.4 [CG]  2222 Potassium(!): 3.3 [CG]  2222 BUN(!): 102 [CG]  2222 Creatinine(!): 5.48 [CG]  2222 Anion gap(!): 28 [CG]  2313 IMPRESSION: 1. Cardiomegaly with vascular congestion and possible early edema. 2. Well-positioned tunneled dialysis catheter.  DG Chest 2 View [CG]    Clinical Course User Index [CG] Kinnie Feil, PA-C   I reviewed patient's EMR to obtain pertinent PMH to assist in MDM.  Admitted in September for left lower extremity cellulitis.  Exam reveals tachycardia, intermittent tachypnea.  BP WNL.  Maintaining normal oxygen saturations.  No rectal fever.  She reports mild shortness of breath but on exam she has no signs of significant respiratory distress, speaking in full sentences.  ER work-up reviewed and interpreted by me  remarkable as above.  There is leukocytosis WBC 27.3, normal lactic acid.  She is tachycardic and intermittently tachypneic.  She meets SIRS criteria with heart rate, RR, white count.  Source is likely left foot given x-ray findings consistent with osteo and subcutaneous air.    Patient re-evaluated and no clinical decline, respiratory status and BP remain stable.  Vancomycin and Zosyn ordered.  We will hold on IV fluids since she has a normal blood pressure, lactic acid and has missed dialysis for the last 3 sessions.  Chest x-ray shows early pulmonary edema.   0027: Discussed with Dr. Hal Hope who will accept patient.  Spoke to Dr. Moshe Cipro with nephrology who has reviewed patient's chart and anticipates patient will go to HD tomorrow morning unless clinical decline.  I have reevaluated patient again and vital signs remarkable for persistent tachycardia.  We will give Tylenol and pain medicine.  No  decline in respiratory status or blood pressure.  Discussed with EDP [place].   Final Clinical Impressions(s) / ED Diagnoses   Final diagnoses:  None    ED Discharge Orders    None       Kinnie Feil, PA-C 08/07/19 0029    Davonna Belling, MD 08/09/19 [phone]

## 2019-08-06 NOTE — ED Triage Notes (Signed)
Pt arrived via [facility]; pt w/ c/o missing dialysis x last three treatments; Pt also has L foot infection with necrotic great toe and swoolen foot; LA restricted; hx of CHF, CKD, CVA, RBKA; 108/50; 110; 20; 98% on RA; CBG 122

## 2019-08-07 ENCOUNTER — Other Ambulatory Visit: Payer: Self-pay

## 2019-08-07 ENCOUNTER — Encounter (HOSPITAL_COMMUNITY): Payer: Self-pay | Admitting: Internal Medicine

## 2019-08-07 DIAGNOSIS — I96 Gangrene, not elsewhere classified: Secondary | ICD-10-CM | POA: Insufficient documentation

## 2019-08-07 DIAGNOSIS — E1022 Type 1 diabetes mellitus with diabetic chronic kidney disease: Secondary | ICD-10-CM

## 2019-08-07 DIAGNOSIS — Z992 Dependence on renal dialysis: Secondary | ICD-10-CM

## 2019-08-07 DIAGNOSIS — I70262 Atherosclerosis of native arteries of extremities with gangrene, left leg: Secondary | ICD-10-CM

## 2019-08-07 DIAGNOSIS — I739 Peripheral vascular disease, unspecified: Secondary | ICD-10-CM

## 2019-08-07 DIAGNOSIS — Z89511 Acquired absence of right leg below knee: Secondary | ICD-10-CM

## 2019-08-07 DIAGNOSIS — A419 Sepsis, unspecified organism: Principal | ICD-10-CM

## 2019-08-07 DIAGNOSIS — N186 End stage renal disease: Secondary | ICD-10-CM

## 2019-08-07 DIAGNOSIS — E1140 Type 2 diabetes mellitus with diabetic neuropathy, unspecified: Secondary | ICD-10-CM

## 2019-08-07 LAB — BASIC METABOLIC PANEL
Anion gap: 24 — ABNORMAL HIGH (ref 5–15)
BUN: 101 mg/dL — ABNORMAL HIGH (ref 8–23)
CO2: 10 mmol/L — ABNORMAL LOW (ref 22–32)
Calcium: 7.3 mg/dL — ABNORMAL LOW (ref 8.9–10.3)
Chloride: 97 mmol/L — ABNORMAL LOW (ref 98–111)
Creatinine, Ser: 5.27 mg/dL — ABNORMAL HIGH (ref 0.44–1.00)
GFR calc Af Amer: 9 mL/min — ABNORMAL LOW (ref >60)
GFR calc non Af Amer: 8 mL/min — ABNORMAL LOW (ref >60)
Glucose, Bld: 75 mg/dL (ref 70–99)
Potassium: 3.2 mmol/L — ABNORMAL LOW (ref 3.5–5.1)
Sodium: 131 mmol/L — ABNORMAL LOW (ref 135–145)

## 2019-08-07 LAB — CBC
HCT: 35 % — ABNORMAL LOW (ref 36.0–46.0)
Hemoglobin: 10.9 g/dL — ABNORMAL LOW (ref 12.0–15.0)
MCH: 29.9 pg (ref 26.0–34.0)
MCHC: 31.1 g/dL (ref 30.0–36.0)
MCV: 95.9 fL (ref 80.0–100.0)
Platelets: 893 10*3/uL — ABNORMAL HIGH (ref 150–400)
RBC: 3.65 MIL/uL — ABNORMAL LOW (ref 3.87–5.11)
RDW: 16.1 % — ABNORMAL HIGH (ref 11.5–15.5)
WBC: 27.7 10*3/uL — ABNORMAL HIGH (ref 4.0–10.5)
nRBC: 0 % (ref 0.0–0.2)

## 2019-08-07 LAB — HEPARIN LEVEL (UNFRACTIONATED): Heparin Unfractionated: 1.28 IU/mL — ABNORMAL HIGH (ref 0.30–0.70)

## 2019-08-07 LAB — HEPATIC FUNCTION PANEL
ALT: 7 U/L (ref 0–44)
AST: 10 U/L — ABNORMAL LOW (ref 15–41)
Albumin: 2.9 g/dL — ABNORMAL LOW (ref 3.5–5.0)
Alkaline Phosphatase: 162 U/L — ABNORMAL HIGH (ref 38–126)
Bilirubin, Direct: 0.2 mg/dL (ref 0.0–0.2)
Indirect Bilirubin: 0.7 mg/dL (ref 0.3–0.9)
Total Bilirubin: 0.9 mg/dL (ref 0.3–1.2)
Total Protein: 8 g/dL (ref 6.5–8.1)

## 2019-08-07 LAB — APTT
aPTT: 70 seconds — ABNORMAL HIGH (ref 24–36)
aPTT: 54 seconds — ABNORMAL HIGH (ref 24–36)

## 2019-08-07 LAB — MRSA PCR SCREENING: MRSA by PCR: POSITIVE — AB

## 2019-08-07 LAB — CBG MONITORING, ED
Glucose-Capillary: 80 mg/dL (ref 70–99)
Glucose-Capillary: 71 mg/dL (ref 70–99)
Glucose-Capillary: 77 mg/dL (ref 70–99)

## 2019-08-07 LAB — PROTIME-INR
INR: 1.6 — ABNORMAL HIGH (ref 0.8–1.2)
Prothrombin Time: 18.4 seconds — ABNORMAL HIGH (ref 11.4–15.2)

## 2019-08-07 LAB — GLUCOSE, CAPILLARY: Glucose-Capillary: 97 mg/dL (ref 70–99)

## 2019-08-07 LAB — LACTIC ACID, PLASMA: Lactic Acid, Venous: 0.9 mmol/L (ref 0.5–1.9)

## 2019-08-07 LAB — SARS CORONAVIRUS 2 (TAT 6-24 HRS): SARS Coronavirus 2: NEGATIVE

## 2019-08-07 MED ORDER — ACETAMINOPHEN 325 MG PO TABS
ORAL_TABLET | ORAL | Status: AC
  Filled 2019-08-07: qty 2

## 2019-08-07 MED ORDER — SACCHAROMYCES BOULARDII 250 MG PO CAPS
250.0000 mg | ORAL_CAPSULE | Freq: Every day | ORAL | Status: DC
  Administered 2019-08-07 – 2019-08-29 (×22): 250 mg via ORAL
  Filled 2019-08-07 (×23): qty 1

## 2019-08-07 MED ORDER — CHLORHEXIDINE GLUCONATE CLOTH 2 % EX PADS: 6.0000 | MEDICATED_PAD | Freq: Every day | CUTANEOUS | Status: DC

## 2019-08-07 MED ORDER — AMLODIPINE BESYLATE 5 MG PO TABS: 5.0000 mg | ORAL_TABLET | Freq: Every day | ORAL | Status: DC

## 2019-08-07 MED ORDER — PIPERACILLIN-TAZOBACTAM IN DEX 2-0.25 GM/50ML IV SOLN
2.2500 g | Freq: Three times a day (TID) | INTRAVENOUS | Status: DC
  Administered 2019-08-07 – 2019-08-20 (×39): 2.25 g via INTRAVENOUS
  Filled 2019-08-07 (×47): qty 50

## 2019-08-07 MED ORDER — PIPERACILLIN-TAZOBACTAM 3.375 G IVPB: 3.3750 g | Freq: Two times a day (BID) | INTRAVENOUS | Status: DC

## 2019-08-07 MED ORDER — PANTOPRAZOLE SODIUM 40 MG PO TBEC: 40.0000 mg | DELAYED_RELEASE_TABLET | Freq: Every day | ORAL | Status: DC

## 2019-08-07 MED ORDER — HYDROMORPHONE HCL 1 MG/ML IJ SOLN
1.0000 mg | Freq: Once | INTRAMUSCULAR | Status: AC
  Administered 2019-08-07: 1 mg via INTRAVENOUS

## 2019-08-07 MED ORDER — HYDRALAZINE HCL 25 MG PO TABS
25.0000 mg | ORAL_TABLET | Freq: Two times a day (BID) | ORAL | Status: DC
  Administered 2019-08-07 – 2019-08-09 (×3): 25 mg via ORAL
  Filled 2019-08-07 (×3): qty 1

## 2019-08-07 MED ORDER — ACETAMINOPHEN 500 MG PO TABS
1000.0000 mg | ORAL_TABLET | Freq: Once | ORAL | Status: AC
  Administered 2019-08-07: 1000 mg via ORAL
  Filled 2019-08-07: qty 2

## 2019-08-07 MED ORDER — ACETAMINOPHEN 650 MG RE SUPP: 650.0000 mg | Freq: Four times a day (QID) | RECTAL | Status: DC | PRN

## 2019-08-07 MED ORDER — ATORVASTATIN CALCIUM 10 MG PO TABS
20.0000 mg | ORAL_TABLET | Freq: Every day | ORAL | Status: DC
  Administered 2019-08-07 – 2019-08-29 (×22): 20 mg via ORAL
  Filled 2019-08-07 (×23): qty 2

## 2019-08-07 MED ORDER — MUPIROCIN 2 % EX OINT
1.0000 "application " | TOPICAL_OINTMENT | Freq: Two times a day (BID) | CUTANEOUS | Status: AC
  Administered 2019-08-07 – 2019-08-12 (×10): 1 via NASAL
  Filled 2019-08-07 (×2): qty 22

## 2019-08-07 MED ORDER — HEPARIN (PORCINE) 25000 UT/250ML-% IV SOLN
2200.0000 [IU]/h | INTRAVENOUS | Status: DC
  Administered 2019-08-07: 1100 [IU]/h via INTRAVENOUS
  Administered 2019-08-08: 1300 [IU]/h via INTRAVENOUS
  Administered 2019-08-08: 1250 [IU]/h via INTRAVENOUS
  Administered 2019-08-09: 1300 [IU]/h via INTRAVENOUS
  Administered 2019-08-10: 1500 [IU]/h via INTRAVENOUS
  Administered 2019-08-11: 2000 [IU]/h via INTRAVENOUS
  Administered 2019-08-12 – 2019-08-14 (×5): 2200 [IU]/h via INTRAVENOUS
  Filled 2019-08-07 (×12): qty 250

## 2019-08-07 MED ORDER — HYDROMORPHONE HCL 1 MG/ML IJ SOLN
INTRAMUSCULAR | Status: AC
  Filled 2019-08-07: qty 1

## 2019-08-07 MED ORDER — HEPARIN SODIUM (PORCINE) 1000 UNIT/ML DIALYSIS
20.0000 [IU]/kg | INTRAMUSCULAR | Status: DC | PRN
  Administered 2019-08-07 – 2019-08-08 (×2): 1500 [IU] via INTRAVENOUS_CENTRAL
  Administered 2019-08-10: 2400 [IU] via INTRAVENOUS_CENTRAL
  Filled 2019-08-07 (×4): qty 2

## 2019-08-07 MED ORDER — HEPARIN SODIUM (PORCINE) 1000 UNIT/ML IJ SOLN
INTRAMUSCULAR | Status: AC
  Administered 2019-08-07: 1500 [IU] via INTRAVENOUS_CENTRAL
  Filled 2019-08-07: qty 4

## 2019-08-07 MED ORDER — ACETAMINOPHEN 325 MG PO TABS
650.0000 mg | ORAL_TABLET | Freq: Four times a day (QID) | ORAL | Status: DC | PRN
  Administered 2019-08-07 – 2019-08-20 (×2): 650 mg via ORAL
  Filled 2019-08-07: qty 2

## 2019-08-07 MED ORDER — VANCOMYCIN HCL IN DEXTROSE 750-5 MG/150ML-% IV SOLN
750.0000 mg | Freq: Once | INTRAVENOUS | Status: AC
  Administered 2019-08-07: 750 mg via INTRAVENOUS
  Filled 2019-08-07: qty 150

## 2019-08-07 MED ORDER — CARVEDILOL 25 MG PO TABS
25.0000 mg | ORAL_TABLET | Freq: Two times a day (BID) | ORAL | Status: DC
  Administered 2019-08-07 – 2019-08-09 (×5): 25 mg via ORAL
  Filled 2019-08-07: qty 1
  Filled 2019-08-07: qty 2
  Filled 2019-08-07 (×3): qty 1

## 2019-08-07 MED ORDER — CALCIUM ACETATE (PHOS BINDER) 667 MG PO CAPS
667.0000 mg | ORAL_CAPSULE | Freq: Three times a day (TID) | ORAL | Status: DC
  Administered 2019-08-07 – 2019-08-11 (×10): 667 mg via ORAL
  Filled 2019-08-07 (×14): qty 1

## 2019-08-07 MED ORDER — CHLORHEXIDINE GLUCONATE CLOTH 2 % EX PADS
6.0000 | MEDICATED_PAD | Freq: Every day | CUTANEOUS | Status: AC
  Administered 2019-08-08 – 2019-08-09 (×2): 6 via TOPICAL

## 2019-08-07 MED ORDER — PANTOPRAZOLE SODIUM 40 MG PO TBEC
40.0000 mg | DELAYED_RELEASE_TABLET | Freq: Every day | ORAL | Status: DC
  Administered 2019-08-08 – 2019-08-29 (×21): 40 mg via ORAL
  Filled 2019-08-07 (×21): qty 1

## 2019-08-07 MED ORDER — HYDROXYZINE HCL 10 MG PO TABS
10.0000 mg | ORAL_TABLET | Freq: Four times a day (QID) | ORAL | Status: DC | PRN
  Filled 2019-08-07: qty 1

## 2019-08-07 NOTE — Progress Notes (Signed)
   08/07/19 1837  Vitals  Temp 97.8 F (36.6 C)  Temp Source Oral  BP 135/70  MAP (mmHg) 77  BP Location Right Arm  BP Method Automatic  Patient Position (if appropriate) Lying  ECG Heart Rate (!) 118  Resp 18  Level of Consciousness  Level of Consciousness Alert  Oxygen Therapy  SpO2 99 %  O2 Device Room Air  Pain Assessment  Pain Scale 0-10  Pain Score 6  Pain Type Acute pain  Pain Location Foot  Pain Orientation Left  Pain Descriptors / Indicators Aching  Pain Frequency Intermittent  Pain Onset On-going  PCA/Epidural/Spinal Assessment  Respiratory Pattern Regular;Unlabored  Height and Weight  Height 5' 7.99" (1.727 m)  Weight 76.2 kg  BSA (Calculated - sq m) 1.91 sq meters  BMI (Calculated) 25.55  Weight in (lb) to have BMI = 25 164  MEWS Score  MEWS RR 0  MEWS Pulse 2  MEWS Systolic 0  MEWS LOC 0  MEWS Temp 0  MEWS Score 2  MEWS Score Color Yellow  MEWS Assessment  Is this an acute change? No  Pt arrived on unit with ongoing tachycardia from HD. MD aware. No new orders at this time. Will continue to monitor.

## 2019-08-07 NOTE — ED Notes (Signed)
Notified RN about patients CBGs results.Kristen Berry

## 2019-08-07 NOTE — ED Notes (Signed)
Notified RN results of patients CBG 77

## 2019-08-07 NOTE — Consult Note (Signed)
ORTHOPAEDIC CONSULTATION  REQUESTING PHYSICIAN: Modena Jansky, MD  Chief Complaint: Gangrene left foot  HPI: Kristen Berry is a 63 y.o. female who presents with diabetes end-stage renal disease on dialysis with peripheral vascular disease and a right transtibial amputation with critical limb ischemia of the left lower extremity presents with cellulitis and gangrenous changes to the left forefoot.  Past Medical History:  Diagnosis Date  . Anemia   . Arthritis    "hands, knees" (10/10/2017)  . Asthma   . CHF (congestive heart failure) ([place])   . Chronic kidney disease (CKD), stage IV (severe) ([place])    Dialysis T/ Th/ Sat  . Coronary artery disease   . H/O Clostridium difficile infection   . High cholesterol   . History of kidney stones   . Hypertension   . PVD (peripheral vascular disease) ([place])    "LLE; will have OR" (10/10/2017)  . Sleep apnea    "never given mask" (10/10/2017)  . Stroke [facility])    mild stroke mid April - 2019  . Type II diabetes mellitus ([place])    "no RX anymore" (10/10/2017)   Past Surgical History:  Procedure Laterality Date  . A/V FISTULAGRAM N/A 12/27/2018   Procedure: A/V FISTULAGRAM - Left Upper;  Surgeon: Serafina Mitchell, MD;  Location: [place] [facility];  Service: Cardiovascular;  Laterality: N/A;  . A/V FISTULAGRAM Left 05/22/2019   Procedure: A/V FISTULAGRAM;  Surgeon: Serafina Mitchell, MD;  Location: [place] [facility];  Service: Cardiovascular;  Laterality: Left;  . AMPUTATION Left 11/10/2017   Procedure: AMPUTATION DIGIT SECOND TOE LEFT FOOT;  Surgeon: Waynetta Sandy, MD;  Location: [place];  Service: Vascular;  Laterality: Left;  . AMPUTATION Left 01/24/2018   Procedure: AMPUTATION THIRD TOE LEFT FOOT;  Surgeon: Waynetta Sandy, MD;  Location: [place];  Service: Vascular;  Laterality: Left;  . AMPUTATION Right 12/22/2018   Procedure: Right foot 1st ray amputation;  Surgeon: Newt Minion, MD;  Location: [place];  Service:  Orthopedics;  Laterality: Right;  . AMPUTATION Right 01/05/2019   Procedure: RIGHT BELOW KNEE AMPUTATION;  Surgeon: Newt Minion, MD;  Location: [place];  Service: Orthopedics;  Laterality: Right;  . AORTOGRAM N/A 11/03/2017   Procedure: Ultrasound Guided Cannulation Right Common Femoral Artery;  Aortagram with Left Lower Extremity Arteriogram; Attempted Treatment Left Superficial Femoral Artery; Percutaneous Closure Right Common Femoral Arteriotomy with Proglide Device;  Surgeon: Waynetta Sandy, MD;  Location: [place];  Service: Vascular;  Laterality: N/A;  . APPLICATION OF WOUND VAC Right 01/05/2019   Procedure: Application Of Wound Vac;  Surgeon: Newt Minion, MD;  Location: [place];  Service: Orthopedics;  Laterality: Right;  . AV FISTULA PLACEMENT Left 11/10/2017   Procedure: ARTERIOVENOUS (AV) FISTULA CREATION LEFT UPPER ARM;  Surgeon: Waynetta Sandy, MD;  Location: [place];  Service: Vascular;  Laterality: Left;  . EXCHANGE OF A DIALYSIS CATHETER Right 02/15/2019   Procedure: EXCHANGE OF A TUNNELED DIALYSIS CATHETER;  Surgeon: Serafina Mitchell, MD;  Location: [place];  Service: Vascular;  Laterality: Right;  . FISTULA SUPERFICIALIZATION Left 02/15/2019   Procedure: SUPERFICIALIZATION WITH BRANCH LIGATION BRACHIOCEPHALIC ARTERIOVENOUS FISTULA LEFT ARM;  Surgeon: Serafina Mitchell, MD;  Location: [place];  Service: Vascular;  Laterality: Left;  . HERNIA REPAIR  1950s  . INSERTION OF DIALYSIS CATHETER Right 11/10/2017   Procedure: INSERTION OF TUNNELED DIALYSIS CATHETER RIGHT INTERNAL JUGULAR PLACEMENT;  Surgeon: Waynetta Sandy, MD;  [facility];  Service: Vascular;  Laterality: Right;  . IR FLUORO GUIDE CV LINE RIGHT  10/19/2017  . IR US GUIDE VASC ACCESS RIGHT  10/19/2017  . LEFT HEART CATH AND CORONARY ANGIOGRAPHY N/A 11/01/2017   Procedure: LEFT HEART CATH AND CORONARY ANGIOGRAPHY;  Surgeon: Belva Crome, MD;  Location: [place] [facility];  Service: Cardiovascular;   Laterality: N/A;  . LOWER EXTREMITY ANGIOGRAM Left 02/15/2019   Procedure: ANGIOGRAM LEFT LOWER EXTREMITY;  Surgeon: Serafina Mitchell, MD;  Location: [place];  Service: Vascular;  Laterality: Left;  . LOWER EXTREMITY ANGIOGRAPHY N/A 12/27/2018   Procedure: LOWER EXTREMITY ANGIOGRAPHY - Right Lower;  Surgeon: Serafina Mitchell, MD;  Location: [place] [facility];  Service: Cardiovascular;  Laterality: N/A;  . PERIPHERAL VASCULAR BALLOON ANGIOPLASTY Left 05/22/2019   Procedure: PERIPHERAL VASCULAR BALLOON ANGIOPLASTY;  Surgeon: Serafina Mitchell, MD;  Location: [place] [facility];  Service: Cardiovascular;  Laterality: Left;  fistula  . PERIPHERAL VASCULAR INTERVENTION Left 11/07/2017   Procedure: PERIPHERAL VASCULAR INTERVENTION;  Surgeon: Waynetta Sandy, MD;  Location: [place] [facility];  Service: Cardiovascular;  Laterality: Left;  left SFA  . PERIPHERAL VASCULAR INTERVENTION Right 12/27/2018   Procedure: PERIPHERAL VASCULAR INTERVENTION;  Surgeon: Serafina Mitchell, MD;  Location: [place] [facility];  Service: Cardiovascular;  Laterality: Right;  SFA  . SHOULDER OPEN ROTATOR CUFF REPAIR Left   . TUBAL LIGATION    . ULTRASOUND GUIDANCE FOR VASCULAR ACCESS  11/01/2017   Procedure: Ultrasound Guidance For Vascular Access;  Surgeon: Belva Crome, MD;  Location: [place] [facility];  Service: Cardiovascular;;  . ULTRASOUND GUIDANCE FOR VASCULAR ACCESS Right 02/15/2019   Procedure: Ultrasound Guidance For Vascular Access;  Surgeon: Serafina Mitchell, MD;  Location: [facility] OR;  Service: Vascular;  Laterality: Right;   Social History   Socioeconomic History  . Marital status: Divorced    Spouse name: Not on file  . Number of children: 3  . Years of education: Not on file  . Highest education level: Not on file  Occupational History  . Occupation: disabled  Social Needs  . Financial resource strain: Not on file  . Food insecurity    Worry: Not on file    Inability: Not on file  . Transportation  needs    Medical: Not on file    Non-medical: Not on file  Tobacco Use  . Smoking status: Never Smoker  . Smokeless tobacco: Never Used  Substance and Sexual Activity  . Alcohol use: No  . Drug use: No  . Sexual activity: Never  Lifestyle  . Physical activity    Days per week: Not on file    Minutes per session: Not on file  . Stress: Not on file  Relationships  . Social Herbalist on phone: Not on file    Gets together: Not on file    Attends religious service: Not on file    Active member of club or organization: Not on file    Attends meetings of clubs or organizations: Not on file    Relationship status: Not on file  Other Topics Concern  . Not on file  Social History Narrative  . Not on file   Family History  Problem Relation Age of Onset  . Hypertension Father   . Heart failure Maternal Grandmother   . Heart failure Maternal Grandfather    - negative except otherwise stated in the family history section Allergies  Allergen Reactions  . Crestor [Rosuvastatin Calcium] Other (See Comments)    Leg pain   Prior to Admission medications   Medication Sig Start Date End Date Taking? Authorizing Provider  acetaminophen (TYLENOL) 500 MG tablet Take 2,000 mg by mouth every 4 (four) hours as needed (pain).    Yes [provider]  albuterol (PROAIR HFA) 108 (90 Base) MCG/ACT inhaler Inhale 2 puffs into the lungs every 6 (six) hours as needed for wheezing or shortness of breath.    Yes [provider]  amLODipine (NORVASC) 5 MG tablet Take 5 mg by mouth at bedtime.    Yes [provider]  apixaban (ELIQUIS) 2.5 MG TABS tablet Take 1 tablet (2.5 mg total) by mouth 2 (two) times daily. 12/30/18  Yes Bonnita Hollow, MD  atorvastatin (LIPITOR) 20 MG tablet Take 1 tablet (20 mg total) by mouth daily at 6 PM. Patient taking differently: Take 20 mg by mouth at bedtime.  11/16/17  Yes Domenic Polite, MD  calcium acetate (PHOSLO) 667 MG capsule  Take 667 mg by mouth See admin instructions. Take one capsule (667 mg) by mouth up to three times daily with meals 10/27/18  Yes [provider]  carvedilol (COREG) 25 MG tablet Take 25 mg by mouth 2 (two) times daily. 08/26/18  Yes [provider]  clotrimazole-betamethasone (LOTRISONE) cream Apply 1 application topically daily as needed (rash).  01/19/18  Yes [provider]  diphenoxylate-atropine (LOMOTIL) 2.5-0.025 MG tablet Take 1 tablet by mouth every 12 (twelve) hours as needed for diarrhea or loose stools.    Yes [provider]  hydrALAZINE (APRESOLINE) 25 MG tablet Take 25 mg by mouth 2 (two) times daily.  01/25/18  Yes Rhyne, Hulen Shouts, PA-C  Melatonin 5 MG CAPS Take 5 mg by mouth at bedtime. 07/30/19  Yes [provider]  pantoprazole (PROTONIX) 40 MG tablet Take 40 mg by mouth at bedtime.    Yes [provider]  saccharomyces boulardii (FLORASTOR) 250 MG capsule Take 1 capsule (250 mg total) by mouth 2 (two) times daily. Patient taking differently: Take 250 mg by mouth at bedtime.  01/12/18  Yes Caren Griffins, MD  ticagrelor (BRILINTA) 90 MG TABS tablet Take 90 mg by mouth 2 (two) times daily.    Yes [provider]  HYDROcodone-acetaminophen (NORCO) 5-325 MG tablet Take 1 tablet by mouth every 6 (six) hours as needed for moderate pain. Patient not taking: Reported on 08/06/2019 02/15/19   Dagoberto Ligas, PA-C  pentafluoroprop-tetrafluoroeth [place]) AERO Apply 1 application topically as needed (topical anesthesia for hemodialysis). Patient not taking: Reported on 08/06/2019 12/30/18   Bonnita Hollow, MD  polyethylene glycol [facility]) packet Take 17 g by mouth daily as needed for mild constipation. Patient not taking: Reported on 08/06/2019 12/30/18   Bonnita Hollow, MD   Dg Chest 2 View  Result Date: 08/06/2019 CLINICAL DATA:  Shortness of breath.  Missed dialysis. EXAM: CHEST - 2 VIEW COMPARISON:  Feb 15, 2019 FINDINGS: There is a well-positioned tunneled dialysis catheter. There is cardiomegaly with vascular congestion and early pulmonary edema. No large pleural effusion. There is no focal infiltrate. No acute osseous abnormality. IMPRESSION: 1. Cardiomegaly with vascular congestion and possible early edema. 2. Well-positioned tunneled dialysis catheter. Electronically Signed   By: Constance Holster M.D.   On: 08/06/2019 22:18   Dg Foot Complete Left  Result Date: 08/06/2019 CLINICAL DATA:  Acute on chronic pain in left great toe. Chronic  wound with positive erythema and warmth. EXAM: LEFT FOOT - COMPLETE 3+ VIEW COMPARISON:  06/17/2019 FINDINGS: Findings are consistent with osteomyelitis involving the proximal phalanx of the first digit. There is subtle lucencies involving the medial aspect of the first metatarsal head. Pockets of subcutaneous gas and soft tissue swelling is noted. There is a lucency involving the tuft of the distal phalanx of the first digit. The patient is status post amputation of the second and third digits as before. There is a large plantar calcaneal spur. There is an Achilles tendon enthesophyte. IMPRESSION: 1. Overall findings consistent with osteomyelitis of the proximal and distal phalanges of the first digit. 2. Questionable osteomyelitis involving the head of the first metatarsal. This can be further evaluated with a contrast enhanced MRI. 3. Soft tissue infection with multiple pockets of subcutaneous gas. Electronically Signed   By: Constance Holster M.D.   On: 08/06/2019 22:16   - pertinent xrays, CT, MRI studies were reviewed and independently interpreted  Positive ROS: All other systems have been reviewed and were otherwise negative with the exception of those mentioned in the HPI and as above.  Physical Exam: General: Alert, no acute distress Psychiatric: Patient is competent for consent with normal mood and affect Lymphatic: No axillary or cervical  lymphadenopathy Cardiovascular: No pedal edema Respiratory: No cyanosis, no use of accessory musculature GI: No organomegaly, abdomen is soft and non-tender    Images:  @ENCIMAGES @  Labs:  Lab Results  Component Value Date   HGBA1C 5.8 (H) 06/26/2019   HGBA1C 5.5 12/22/2018   HGBA1C 5.5 10/13/2017   ESRSEDRATE 70 (H) 06/26/2019   ESRSEDRATE 100 (H) 12/21/2018   ESRSEDRATE 127 (H) 10/27/2017   CRP 15.6 (H) 06/26/2019   CRP 18.8 (H) 10/27/2017   REPTSTATUS PENDING 08/06/2019   CULT  08/06/2019    NO GROWTH < 12 HOURS Performed at [facility], [place] [address] [place], [place] [address]     Lab Results  Component Value Date   ALBUMIN 2.9 (L) 08/06/2019   ALBUMIN 2.9 (L) 06/27/2019   ALBUMIN 3.6 06/25/2019    Neurologic: Patient does not have protective sensation bilateral lower extremities.   MUSCULOSKELETAL:   Skin: Examination patient has cellulitis of the left foot.  There is no cellulitis proximal to the ankle.  There are dry gangrenous changes to the left great toe.  Review of the radiographs shows osteomyelitis of the great toe metatarsal.  There is air in the soft tissue up to the midfoot.  Patient does not have a palpable dorsalis pedis pulse.  Review of the patient's most recent ankle-brachial indices shows critical limb ischemia.  Patient had been evaluated by vascular vein surgery for possible revascularization for the left lower extremity.    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  Plan: Plan: Discussed patient's options of evaluation with vascular vein surgery for possible revascularization versus transtibial amputation.  Patient states that she does not want to consider an amputation at this time.  I have consulted vascular vein surgery for evaluation for revascularization to the left lower extremity.  Thank  you for the consult and the opportunity to see Ms. [place], [place] [phone] 8:54 AM

## 2019-08-07 NOTE — Progress Notes (Signed)
ANTICOAGULATION CONSULT NOTE - Initial Consult  Pharmacy Consult for Heparin Indication: h/o DVT  Allergies  Allergen Reactions  . Crestor [Rosuvastatin Calcium] Other (See Comments)    Leg pain    Patient Measurements: Weight: 167 lb 15.9 oz (76.2 kg)   Vital Signs: Temp: 98.3 F (36.8 C) (11/09 2130) Temp Source: Rectal (11/09 2130) BP: 140/67 (11/10 0330) Pulse Rate: 117 (11/10 0330)  Labs: Recent Labs    08/06/19 2058  HGB 12.0  HCT 37.0  PLT 965*  CREATININE 5.48*    Estimated Creatinine Clearance: 10.5 mL/min (A) (by C-G formula based on SCr of 5.48 mg/dL (H)).   Medical History: Past Medical History:  Diagnosis Date  . Anemia   . Arthritis    "hands, knees" (10/10/2017)  . Asthma   . CHF (congestive heart failure) ([place])   . Chronic kidney disease (CKD), stage IV (severe) ([place])    Dialysis T/ Th/ Sat  . Coronary artery disease   . H/O Clostridium difficile infection   . High cholesterol   . History of kidney stones   . Hypertension   . PVD (peripheral vascular disease) ([place])    "LLE; will have OR" (10/10/2017)  . Sleep apnea    "never given mask" (10/10/2017)  . Stroke [facility])    mild stroke mid April - 2019  . Type II diabetes mellitus ([place])    "no RX anymore" (10/10/2017)    Medications:  No current facility-administered medications on file prior to encounter.    Current Outpatient Medications on File Prior to Encounter  Medication Sig Dispense Refill  . acetaminophen (TYLENOL) 500 MG tablet Take 2,000 mg by mouth every 4 (four) hours as needed (pain).     [name] albuterol (PROAIR HFA) 108 (90 Base) MCG/ACT inhaler Inhale 2 puffs into the lungs every 6 (six) hours as needed for wheezing or shortness of breath.     [name] amLODipine (NORVASC) 5 MG tablet Take 5 mg by mouth at bedtime.     [name] apixaban (ELIQUIS) 2.5 MG TABS tablet Take 1 tablet (2.5 mg total) by mouth 2 (two) times daily. 60 tablet   . atorvastatin (LIPITOR) 20 MG tablet Take 1 tablet (20 mg  total) by mouth daily at 6 PM. (Patient taking differently: Take 20 mg by mouth at bedtime. ) 30 tablet 0  . calcium acetate (PHOSLO) 667 MG capsule Take 667 mg by mouth See admin instructions. Take one capsule (667 mg) by mouth up to three times daily with meals    . carvedilol (COREG) 25 MG tablet Take 25 mg by mouth 2 (two) times daily.    . clotrimazole-betamethasone (LOTRISONE) cream Apply 1 application topically daily as needed (rash).   0  . diphenoxylate-atropine (LOMOTIL) 2.5-0.025 MG tablet Take 1 tablet by mouth every 12 (twelve) hours as needed for diarrhea or loose stools.     . hydrALAZINE (APRESOLINE) 25 MG tablet Take 25 mg by mouth 2 (two) times daily.     . Melatonin 5 MG CAPS Take 5 mg by mouth at bedtime.    . pantoprazole (PROTONIX) 40 MG tablet Take 40 mg by mouth at bedtime.     . saccharomyces boulardii (FLORASTOR) 250 MG capsule Take 1 capsule (250 mg total) by mouth 2 (two) times daily. (Patient taking differently: Take 250 mg by mouth at bedtime. ) 20 capsule 0  . ticagrelor (BRILINTA) 90 MG TABS tablet Take 90 mg by mouth 2 (two) times daily.     [name] HYDROcodone-acetaminophen (  NORCO) 5-325 MG tablet Take 1 tablet by mouth every 6 (six) hours as needed for moderate pain. (Patient not taking: Reported on 08/06/2019) 12 tablet 0  . pentafluoroprop-tetrafluoroeth (GEBAUERS) AERO Apply 1 application topically as needed (topical anesthesia for hemodialysis). (Patient not taking: Reported on 08/06/2019)  0  . polyethylene glycol (MIRALAX / GLYCOLAX) packet Take 17 g by mouth daily as needed for mild constipation. (Patient not taking: Reported on 08/06/2019) 14 each 0     Assessment: 63 y.o. female with h/o DVT, Eliquis on hold, for heparin  Goal of Therapy: APTT 66-102 sec  Heparin level 0.3-0.7 units/ml Monitor platelets by anticoagulation protocol: Yes   Plan:  Start heparin 1100 units/hr APTT in 8 hours  Karey Suthers, [place]urb 08/07/2019,6:09 AM

## 2019-08-07 NOTE — H&P (Signed)
History and Physical    Kristen Berry TDH:[id number] DOB: [birth year] DOA: 08/06/2019  PCP: Martinique, Sarah T, MD  Patient coming from: Home.  Chief Complaint: Left foot great toe gangrene and missed dialysis.  HPI: Kristen Berry is a 63 y.o. female with history of ESRD on hemodialysis on Monday Wednesday Friday, peripheral vascular disease status post stenting, history of DVT, anemia presents to the ER after patient has been noticing increasing swelling of the left foot increasing discharge and gangrene of the great toe noticed over the last 2 weeks.  Patient was admitted last month for cellulitis involving the left foot and great toe.  Patient states over the last 1 week patient missed 3 sessions of her dialysis due to transport issues.  Patient is mildly short of breath but not in distress.  Has noticed increasing swelling of the left foot with increasing discharge mostly from the left great toe which has become gangrenous of the last 2 weeks.   ED Course: In the ER patient was tachycardic with leukocytosis of 27,000 potassium was 3.3 lactic acid 1.3 platelets 965 chest x-ray unremarkable COVID-19 test is pending.  X-ray of the left foot shows multiple pockets of gas and osteomyelitis of the left great toe and first metatarsal.  Blood cultures were obtained and started on empiric antibiotics.  Patient admitted for sepsis secondary to left foot cellulitis and osteomyelitis.  Review of Systems: As per HPI, rest all negative.   Past Medical History:  Diagnosis Date  . Anemia   . Arthritis    "hands, knees" (10/10/2017)  . Asthma   . CHF (congestive heart failure) ([place])   . Chronic kidney disease (CKD), stage IV (severe) ([place])    Dialysis T/ Th/ Sat  . Coronary artery disease   . H/O Clostridium difficile infection   . High cholesterol   . History of kidney stones   . Hypertension   . PVD (peripheral vascular disease) ([place])    "LLE; will have OR" (10/10/2017)  . Sleep apnea    "never given  mask" (10/10/2017)  . Stroke [facility])    mild stroke mid April - 2019  . Type II diabetes mellitus ([place])    "no RX anymore" (10/10/2017)    Past Surgical History:  Procedure Laterality Date  . A/V FISTULAGRAM N/A 12/27/2018   Procedure: A/V FISTULAGRAM - Left Upper;  Surgeon: Serafina Mitchell, MD;  Location: [place] [facility];  Service: Cardiovascular;  Laterality: N/A;  . A/V FISTULAGRAM Left 05/22/2019   Procedure: A/V FISTULAGRAM;  Surgeon: Serafina Mitchell, MD;  Location: [place] [facility];  Service: Cardiovascular;  Laterality: Left;  . AMPUTATION Left 11/10/2017   Procedure: AMPUTATION DIGIT SECOND TOE LEFT FOOT;  Surgeon: Waynetta Sandy, MD;  Location: [name];  Service: Vascular;  Laterality: Left;  . AMPUTATION Left 01/24/2018   Procedure: AMPUTATION THIRD TOE LEFT FOOT;  Surgeon: Waynetta Sandy, MD;  Location: [place];  Service: Vascular;  Laterality: Left;  . AMPUTATION Right 12/22/2018   Procedure: Right foot 1st ray amputation;  Surgeon: Newt Minion, MD;  Location: [place];  Service: Orthopedics;  Laterality: Right;  . AMPUTATION Right 01/05/2019   Procedure: RIGHT BELOW KNEE AMPUTATION;  Surgeon: Newt Minion, MD;  Location: [place];  Service: Orthopedics;  Laterality: Right;  . AORTOGRAM N/A 11/03/2017   Procedure: Ultrasound Guided Cannulation Right Common Femoral Artery;  Aortagram with Left Lower Extremity Arteriogram; Attempted Treatment Left Superficial Femoral Artery; Percutaneous Closure Right  Common Femoral Arteriotomy with Proglide Device;  Surgeon: Waynetta Sandy, MD;  Location: [place];  Service: Vascular;  Laterality: N/A;  . APPLICATION OF WOUND VAC Right 01/05/2019   Procedure: Application Of Wound Vac;  Surgeon: Newt Minion, MD;  Location: [place];  Service: Orthopedics;  Laterality: Right;  . AV FISTULA PLACEMENT Left 11/10/2017   Procedure: ARTERIOVENOUS (AV) FISTULA CREATION LEFT UPPER ARM;  Surgeon: Waynetta Sandy, MD;  Location:  [place];  Service: Vascular;  Laterality: Left;  . EXCHANGE OF A DIALYSIS CATHETER Right 02/15/2019   Procedure: EXCHANGE OF A TUNNELED DIALYSIS CATHETER;  Surgeon: Serafina Mitchell, MD;  Location: [place];  Service: Vascular;  Laterality: Right;  . FISTULA SUPERFICIALIZATION Left 02/15/2019   Procedure: SUPERFICIALIZATION WITH BRANCH LIGATION BRACHIOCEPHALIC ARTERIOVENOUS FISTULA LEFT ARM;  Surgeon: Serafina Mitchell, MD;  Location: [place];  Service: Vascular;  Laterality: Left;  . HERNIA REPAIR  1950s  . INSERTION OF DIALYSIS CATHETER Right 11/10/2017   Procedure: INSERTION OF TUNNELED DIALYSIS CATHETER RIGHT INTERNAL JUGULAR PLACEMENT;  Surgeon: Waynetta Sandy, MD;  Location: [place];  Service: Vascular;  Laterality: Right;  . IR FLUORO GUIDE CV LINE RIGHT  10/19/2017  . IR US GUIDE VASC ACCESS RIGHT  10/19/2017  . LEFT HEART CATH AND CORONARY ANGIOGRAPHY N/A 11/01/2017   Procedure: LEFT HEART CATH AND CORONARY ANGIOGRAPHY;  Surgeon: Belva Crome, MD;  Location: [place] [facility];  Service: Cardiovascular;  Laterality: N/A;  . LOWER EXTREMITY ANGIOGRAM Left 02/15/2019   Procedure: ANGIOGRAM LEFT LOWER EXTREMITY;  Surgeon: Serafina Mitchell, MD;  Location: [place];  Service: Vascular;  Laterality: Left;  . LOWER EXTREMITY ANGIOGRAPHY N/A 12/27/2018   Procedure: LOWER EXTREMITY ANGIOGRAPHY - Right Lower;  Surgeon: Serafina Mitchell, MD;  Location: [place] [facility];  Service: Cardiovascular;  Laterality: N/A;  . PERIPHERAL VASCULAR BALLOON ANGIOPLASTY Left 05/22/2019   Procedure: PERIPHERAL VASCULAR BALLOON ANGIOPLASTY;  Surgeon: Serafina Mitchell, MD;  Location: [place] [facility];  Service: Cardiovascular;  Laterality: Left;  fistula  . PERIPHERAL VASCULAR INTERVENTION Left 11/07/2017   Procedure: PERIPHERAL VASCULAR INTERVENTION;  Surgeon: Waynetta Sandy, MD;  Location: [place] [facility];  Service: Cardiovascular;  Laterality: Left;  left SFA  . PERIPHERAL VASCULAR INTERVENTION Right  12/27/2018   Procedure: PERIPHERAL VASCULAR INTERVENTION;  Surgeon: Serafina Mitchell, MD;  Location: [place] [facility];  Service: Cardiovascular;  Laterality: Right;  SFA  . SHOULDER OPEN ROTATOR CUFF REPAIR Left   . TUBAL LIGATION    . ULTRASOUND GUIDANCE FOR VASCULAR ACCESS  11/01/2017   Procedure: Ultrasound Guidance For Vascular Access;  Surgeon: Belva Crome, MD;  Location: [place] [facility];  Service: Cardiovascular;;  . ULTRASOUND GUIDANCE FOR VASCULAR ACCESS Right 02/15/2019   Procedure: Ultrasound Guidance For Vascular Access;  Surgeon: Serafina Mitchell, MD;  [facility];  Service: Vascular;  Laterality: Right;     reports that she has never smoked. She has never used smokeless tobacco. She reports that she does not drink alcohol or use drugs.  Allergies  Allergen Reactions  . Crestor [Rosuvastatin Calcium] Other (See Comments)    Leg pain    Family History  Problem Relation Age of Onset  . Hypertension Father   . Heart failure Maternal Grandmother   . Heart failure Maternal Grandfather     Prior to Admission medications   Medication Sig Start Date End Date Taking? Authorizing Provider  acetaminophen (TYLENOL) 500 MG tablet  Take 2,000 mg by mouth every 4 (four) hours as needed (pain).    Yes [provider]  albuterol (PROAIR HFA) 108 (90 Base) MCG/ACT inhaler Inhale 2 puffs into the lungs every 6 (six) hours as needed for wheezing or shortness of breath.    Yes [provider]  amLODipine (NORVASC) 5 MG tablet Take 5 mg by mouth at bedtime.    Yes [provider]  apixaban (ELIQUIS) 2.5 MG TABS tablet Take 1 tablet (2.5 mg total) by mouth 2 (two) times daily. 12/30/18  Yes Bonnita Hollow, MD  atorvastatin (LIPITOR) 20 MG tablet Take 1 tablet (20 mg total) by mouth daily at 6 PM. Patient taking differently: Take 20 mg by mouth at bedtime.  11/16/17  Yes Domenic Polite, MD  calcium acetate (PHOSLO) 667 MG capsule Take 667 mg by mouth See admin  instructions. Take one capsule (667 mg) by mouth up to three times daily with meals 10/27/18  Yes [provider]  carvedilol (COREG) 25 MG tablet Take 25 mg by mouth 2 (two) times daily. 08/26/18  Yes [provider]  clotrimazole-betamethasone (LOTRISONE) cream Apply 1 application topically daily as needed (rash).  01/19/18  Yes [provider]  diphenoxylate-atropine (LOMOTIL) 2.5-0.025 MG tablet Take 1 tablet by mouth every 12 (twelve) hours as needed for diarrhea or loose stools.    Yes [provider]  hydrALAZINE (APRESOLINE) 25 MG tablet Take 25 mg by mouth 2 (two) times daily.  01/25/18  Yes Rhyne, Hulen Shouts, PA-C  Melatonin 5 MG CAPS Take 5 mg by mouth at bedtime. 07/30/19  Yes [provider]  pantoprazole (PROTONIX) 40 MG tablet Take 40 mg by mouth at bedtime.    Yes [provider]  saccharomyces boulardii (FLORASTOR) 250 MG capsule Take 1 capsule (250 mg total) by mouth 2 (two) times daily. Patient taking differently: Take 250 mg by mouth at bedtime.  01/12/18  Yes Caren Griffins, MD  ticagrelor (BRILINTA) 90 MG TABS tablet Take 90 mg by mouth 2 (two) times daily.    Yes [provider]  HYDROcodone-acetaminophen (NORCO) 5-325 MG tablet Take 1 tablet by mouth every 6 (six) hours as needed for moderate pain. Patient not taking: Reported on 08/06/2019 02/15/19   Dagoberto Ligas, PA-C  pentafluoroprop-tetrafluoroeth [place]ke) AERO Apply 1 application topically as needed (topical anesthesia for hemodialysis). Patient not taking: Reported on 08/06/2019 12/30/18   Bonnita Hollow, MD  polyethylene glycol [facility] / Floria Raveling) packet Take 17 g by mouth daily as needed for mild constipation. Patient not taking: Reported on 08/06/2019 12/30/18   Bonnita Hollow, MD    Physical Exam: Constitutional: Moderately built and nourished. Vitals:   08/07/19 0015 08/07/19 0030 08/07/19 0045 08/07/19 0100  BP:  140/70  120/62  Pulse: (!) 119  (!) 117 (!) 116 (!) 118  Resp: 18 18 15 19   Temp:      TempSrc:      SpO2: 99% 98% 98% 96%  Weight:       Eyes: Anicteric no pallor. ENMT: No discharge from the ears eyes nose or mouth. Neck: No mass felt.  No neck rigidity. Respiratory: No rhonchi or crepitations. Cardiovascular: S1-S2 heard. Abdomen: Soft nontender bowel sounds present. Musculoskeletal: Left foot is swollen with no definite palpable or dopplerable pulse.  Left great toe is gangrenous.  Some discharge. Skin: Left foot is swollen and erythematous.  Left great toe is gangrenous. Neurologic: Alert awake oriented to time place and person.  Moves all extremities. Psychiatric: Appears normal.   Labs on Admission: I have personally reviewed following labs and imaging studies  CBC: Recent Labs  Lab 08/06/19 2058  WBC 27.3*  NEUTROABS 23.4*  HGB 12.0  HCT 37.0  MCV 94.9  PLT 875*   Basic Metabolic Panel: Recent Labs  Lab 08/06/19 2058  NA 133*  K 3.3*  CL 95*  CO2 10*  GLUCOSE 107*  BUN 102*  CREATININE 5.48*  CALCIUM 7.5*  MG 2.3   GFR: Estimated Creatinine Clearance: 10.5 mL/min (A) (by C-G formula based on SCr of 5.48 mg/dL (H)). Liver Function Tests: Recent Labs  Lab 08/06/19 2235  AST 10*  ALT 7  ALKPHOS 162*  BILITOT 0.9  PROT 8.0  ALBUMIN 2.9*   No results for input(s): LIPASE, AMYLASE in the last 168 hours. No results for input(s): AMMONIA in the last 168 hours. Coagulation Profile: No results for input(s): INR, PROTIME in the last 168 hours. Cardiac Enzymes: No results for input(s): CKTOTAL, CKMB, CKMBINDEX, TROPONINI in the last 168 hours. BNP (last 3 results) No results for input(s): PROBNP in the last 8760 hours. HbA1C: No results for input(s): HGBA1C in the last 72 hours. CBG: No results for input(s): GLUCAP in the last 168 hours. Lipid Profile: No results for input(s): CHOL, HDL, LDLCALC, TRIG, CHOLHDL, LDLDIRECT in the last 72 hours. Thyroid Function Tests: No results  for input(s): TSH, T4TOTAL, FREET4, T3FREE, THYROIDAB in the last 72 hours. Anemia Panel: No results for input(s): VITAMINB12, FOLATE, FERRITIN, TIBC, IRON, RETICCTPCT in the last 72 hours. Urine analysis:    Component Value Date/Time   COLORURINE YELLOW 10/17/2017 1402   APPEARANCEUR HAZY (A) 10/17/2017 1402   LABSPEC 1.013 10/17/2017 1402   PHURINE 5.0 10/17/2017 1402   GLUCOSEU NEGATIVE 10/17/2017 1402   HGBUR NEGATIVE 10/17/2017 1402   BILIRUBINUR NEGATIVE 10/17/2017 1402   KETONESUR 5 (A) 10/17/2017 1402   PROTEINUR NEGATIVE 10/17/2017 1402   NITRITE NEGATIVE 10/17/2017 1402   LEUKOCYTESUR NEGATIVE 10/17/2017 1402   Sepsis Labs: @LABRCNTIP (procalcitonin:4,lacticidven:4) )No results found for this or any previous visit (from the past 240 hour(s)).   Radiological Exams on Admission: Dg Chest 2 View  Result Date: 08/06/2019 CLINICAL DATA:  Shortness of breath.  Missed dialysis. EXAM: CHEST - 2 VIEW COMPARISON:  Feb 15, 2019 FINDINGS: There is a well-positioned tunneled dialysis catheter. There is cardiomegaly with vascular congestion and early pulmonary edema. No large pleural effusion. There is no focal infiltrate. No acute osseous abnormality. IMPRESSION: 1. Cardiomegaly with vascular congestion and possible early edema. 2. Well-positioned tunneled dialysis catheter. Electronically Signed   By: Constance Holster M.D.   On: 08/06/2019 22:18   Dg Foot Complete Left  Result Date: 08/06/2019 CLINICAL DATA:  Acute on chronic pain in left great toe. Chronic wound with positive erythema and warmth. EXAM: LEFT FOOT - COMPLETE 3+ VIEW COMPARISON:  06/17/2019 FINDINGS: Findings are consistent with osteomyelitis involving the proximal phalanx of the first digit. There is subtle lucencies involving the medial aspect of the first metatarsal head. Pockets of subcutaneous gas and soft tissue swelling is noted. There is a lucency involving the tuft of the distal phalanx of the first digit. The  patient is status post amputation of the second and third digits as before. There is a large plantar calcaneal spur. There is an Achilles tendon enthesophyte. IMPRESSION: 1. Overall findings consistent with osteomyelitis of the proximal and distal phalanges of the first digit. 2. Questionable osteomyelitis involving the head of the  first metatarsal. This can be further evaluated with a contrast enhanced MRI. 3. Soft tissue infection with multiple pockets of subcutaneous gas. Electronically Signed   By: Constance Holster M.D.   On: 08/06/2019 22:16    EKG: Independently reviewed.  Sinus tachycardia.  Assessment/Plan Principal Problem:   Sepsis ([place]) Active Problems:   Stage 4 chronic kidney disease ([place])   Peripheral artery disease (HCC)   Coronary artery disease without angina pectoris   S/P BKA (below knee amputation), right (HCC)   Anemia in chronic kidney disease   Type 2 diabetes mellitus with diabetic neuropathy, unspecified ([place])    1. Sepsis secondary to left foot cellulitis and osteomyelitis involving the great toe with gangrenous looking left great toe with peripheral vascular disease -we will keep patient n.p.o. and IV antibiotics for sepsis.  Will consult orthopedics.  Follow cultures. 2. Peripheral vascular disease presently having gangrenous toe.  Pulses are not dopplerable.  Patient is on apixaban which we will hold on keep patient on heparin in anticipation of procedure. 3. History of DVT on apixaban.  Apixaban hold in anticipation of procedure.  We will keep her on heparin. 4. ESRD on hemodialysis on Monday Wednesday and Friday.  Patient missed dialysis due to transport issues.  Will consult nephrology for dialysis. 5. Thrombocytosis could be reactionary.  Follow CBC. 6. Hypertension on Coreg and amlodipine.  Will hold amlodipine due to low normal blood pressure. 7. Diabetes mellitus type 2 presently not on medication.  Follow CBGs closely.  Given the septic picture patient  likely will need more than 2 midnight stay in inpatient status.   DVT prophylaxis: Heparin. Code Status: Full code. Family Communication: Discussed with patient. Disposition Plan: Home. Consults called: We will consult orthopedics and vascular surgery and nephrology. Admission status: Inpatient.   Rise Patience MD [facility] Pager [phone].  If 7PM-7AM, please contact night-coverage [email or web address] Password [email or web address]  08/07/2019, 2:59 AM

## 2019-08-07 NOTE — Progress Notes (Addendum)
ANTICOAGULATION CONSULT NOTE - Initial Consult  Pharmacy Consult for Heparin Indication: h/o DVT  Allergies  Allergen Reactions  . Crestor [Rosuvastatin Calcium] Other (See Comments)    Leg pain    Patient Measurements: Weight: (UTA ED bed)   Vital Signs: Temp: 97.8 F (36.6 C) (11/10 1837) Temp Source: Oral (11/10 1837) BP: 135/70 (11/10 1837) Pulse Rate: 127 (11/10 1745)  Labs: Recent Labs    08/06/19 2058 08/07/19 0529 08/07/19 1500  HGB 12.0 10.9*  --   HCT 37.0 35.0*  --   PLT 965* 893*  --   APTT  --  70* 54*  LABPROT  --  18.4*  --   INR  --  1.6*  --   HEPARINUNFRC  --  1.28*  --   CREATININE 5.48* 5.27*  --     Estimated Creatinine Clearance: 10.9 mL/min (A) (by C-G formula based on SCr of 5.27 mg/dL (H)).   Medical History: Past Medical History:  Diagnosis Date  . Anemia   . Arthritis    "hands, knees" (10/10/2017)  . Asthma   . CHF (congestive heart failure) ([place])   . Coronary artery disease   . ESRD on hemodialysis [facility])    Dialysis T/ Th/ Sat  . H/O Clostridium difficile infection   . High cholesterol   . History of kidney stones   . Hypertension   . PVD (peripheral vascular disease) ([place])    "LLE; will have OR" (10/10/2017)  . Sleep apnea    "never given mask" (10/10/2017)  . Stroke [facility])    mild stroke mid April - 2019  . Type II diabetes mellitus ([place])    "no RX anymore" (10/10/2017)    Medications:  No current facility-administered medications on file prior to encounter.    Current Outpatient Medications on File Prior to Encounter  Medication Sig Dispense Refill  . acetaminophen (TYLENOL) 500 MG tablet Take 2,000 mg by mouth every 4 (four) hours as needed (pain).     [name] albuterol (PROAIR HFA) 108 (90 Base) MCG/ACT inhaler Inhale 2 puffs into the lungs every 6 (six) hours as needed for wheezing or shortness of breath.     [name] amLODipine (NORVASC) 5 MG tablet Take 5 mg by mouth at bedtime.     [name] apixaban (ELIQUIS) 2.5 MG TABS tablet  Take 1 tablet (2.5 mg total) by mouth 2 (two) times daily. 60 tablet   . atorvastatin (LIPITOR) 20 MG tablet Take 1 tablet (20 mg total) by mouth daily at 6 PM. (Patient taking differently: Take 20 mg by mouth at bedtime. ) 30 tablet 0  . calcium acetate (PHOSLO) 667 MG capsule Take 667 mg by mouth See admin instructions. Take one capsule (667 mg) by mouth up to three times daily with meals    . carvedilol (COREG) 25 MG tablet Take 25 mg by mouth 2 (two) times daily.    . clotrimazole-betamethasone (LOTRISONE) cream Apply 1 application topically daily as needed (rash).   0  . diphenoxylate-atropine (LOMOTIL) 2.5-0.025 MG tablet Take 1 tablet by mouth every 12 (twelve) hours as needed for diarrhea or loose stools.     . hydrALAZINE (APRESOLINE) 25 MG tablet Take 25 mg by mouth 2 (two) times daily.     . Melatonin 5 MG CAPS Take 5 mg by mouth at bedtime.    . pantoprazole (PROTONIX) 40 MG tablet Take 40 mg by mouth at bedtime.     . saccharomyces boulardii (FLORASTOR) 250 MG capsule Take 1  capsule (250 mg total) by mouth 2 (two) times daily. (Patient taking differently: Take 250 mg by mouth at bedtime. ) 20 capsule 0  . ticagrelor (BRILINTA) 90 MG TABS tablet Take 90 mg by mouth 2 (two) times daily.     [name] HYDROcodone-acetaminophen (NORCO) 5-325 MG tablet Take 1 tablet by mouth every 6 (six) hours as needed for moderate pain. (Patient not taking: Reported on 08/06/2019) 12 tablet 0  . pentafluoroprop-tetrafluoroeth (GEBAUERS) AERO Apply 1 application topically as needed (topical anesthesia for hemodialysis). (Patient not taking: Reported on 08/06/2019)  0  . polyethylene glycol (MIRALAX / GLYCOLAX) packet Take 17 g by mouth daily as needed for mild constipation. (Patient not taking: Reported on 08/06/2019) 14 each 0     Assessment: 63 y.o. female with h/o DVT, Eliquis on hold, for heparin  Goal of Therapy: APTT 66-102 sec  Heparin level 0.3-0.7 units/ml Monitor platelets by anticoagulation protocol:  Yes   Plan:  - aPTT subtherapeutic at 54  - Will increase Heparin drip to 1250 units/hr - Follow up aPTT in 8 hours  - Continue daily Heparin levels until correlates with aPTT  Duanne Limerick PharmD. BCPS 08/07/2019,6:43 PM

## 2019-08-07 NOTE — Progress Notes (Addendum)
Kristen Berry is a 63 y.o. female patient admitted from ED awake, alert - oriented  X 4 - no acute distress noted. AOx4.  VSS - Blood pressure 135/70, pulse (!) 127, temperature 97.8 F (36.6 C), temperature source Oral, resp. rate 18, height 5' 7.99" (1.727 m), weight 76.2 kg, SpO2 99 %.    IV infusing.  Call light within reach, patient able to voice, and demonstrate understanding.   Will cont to eval and treat per MD orders.  Jeanella Craze, RN 08/07/2019 7:01 PM

## 2019-08-07 NOTE — Procedures (Signed)
   I was present at this dialysis session, have reviewed the session itself and made  appropriate changes [name] MD [place] pager [phone]   08/07/2019, 4:43 PM

## 2019-08-07 NOTE — Progress Notes (Signed)
PROGRESS NOTE   Kristen Berry  IOE:[id number]    DOB: 1956/01/29    DOA: 08/06/2019  PCP: Martinique, Sarah T, MD   I have briefly reviewed patients previous medical records in [facility].  Chief Complaint  Patient presents with  . Missed Dialysis    x last 3 treatments    Brief Narrative:  63 year old female, living alone, with PMH of diet-controlled DM 2, HTN, HLD, ESRD on MWF HD, OSA not on CPAP, PAD s/p right BKA, CAD, chronic diastolic CHF, anemia ,CVA, DVT, hospitalized 9/28-10/1 for cellulitis of left foot, missed a week of HD due to lack of transport to HD, presented to [facility] ED on 08/06/2019 due to generally feeling unwell, increased swelling of left foot, blackish discoloration of left great toe with drainage ongoing for last 2 weeks.  Admitted for sepsis complicating left foot cellulitis/osteomyelitis and abscess.  Dr. Sharol Given, orthopedics consulted, patient prefers possible revascularization over transtibial amputation and hence vascular surgery consulted and plan PAD evaluation later this week.  Nephrology consulted for dialysis.   Assessment & Plan:   Principal Problem:   Sepsis ([place]) Active Problems:   Stage 4 chronic kidney disease ([place])   Peripheral artery disease (HCC)   Coronary artery disease without angina pectoris   S/P BKA (below knee amputation), right (HCC)   Anemia in chronic kidney disease   Type 2 diabetes mellitus with diabetic neuropathy, unspecified ([place])   Sepsis due to diabetic insensate neuropathy with critical limb ischemia of left foot/great toe with cellulitis, osteomyelitis and gangrene.  Dr. Sharol Given, orthopedics consultation appreciated.  He discussed options of possible revascularization versus transtibial amputation.  Patient declined amputation at this time.  He has consulted vascular surgery.  I discussed with Dr. Trula Slade, VVS who will see her later today but plans PAD evaluation later this week.  Continue empirically started IV vancomycin and  Zosyn.  ESRD on MWF HD/AG metabolic acidosis  Missed 1 week of HD due to living alone since daughter moved out and lack of reliable transportation due to wheelchair dependence.  Social work consulted.  Nephrology consultation appreciated and plan serial HD today and tomorrow.  Bicarbonate 10.  Lactate normal.  Essential hypertension  Controlled.  Continue carvedilol 25 mg twice daily and hydralazine 25 mg twice daily.  Amlodipine held.  Volume management across HD.  History of CVA  Anemia in CKD  At baseline her hemoglobin appears to be normal.  Hemoglobin drop to 10.9, unclear etiology.  Follow CBC in a.m.  History of DVT  Apixaban on hold for possible procedures.  Continue IV heparin drip which can be held a few hours prior to procedures.  Diet controlled DM 2  Monitor CBGs.  Well controlled.  Leukocytosis  Secondary to sepsis.  Follow CBCs.  Thrombocytosis  Likely reactive to sepsis.  Follow CBCs.  Hypokalemia  Management across dialysis.  S/p right BKA   DVT prophylaxis: IV heparin infusion Code Status: Full Family Communication: None at bedside Disposition: To be determined pending clinical improvement   Consultants:  Orthopedics Vascular surgery Nephrology  Procedures:  None  Antimicrobials:  IV vancomycin and Zosyn   Subjective: Patient seen this morning in the ED.  Denies complaints.  No pain in left lower extremity.  Denies chest pain or dyspnea.  Objective:  Vitals:   08/07/19 0830 08/07/19 0930 08/07/19 1030 08/07/19 1212  BP: (!) 147/70 131/65 121/67 (!) 126/50  Pulse: (!) 109 (!) 104 (!) 108 (!) 108  Resp: 20 16 (!)  24 16  Temp:  97.9 F (36.6 C)  97.7 F (36.5 C)  TempSrc:  Oral  Oral  SpO2: 100% 99% 98% 98%  Weight:        Examination:  General exam: Pleasant middle-aged female, moderately built and nourished sitting up comfortably in bed. Respiratory system: Clear to auscultation. Respiratory effort  normal. Cardiovascular system: S1 & S2 heard, mild regular tachycardia. No JVD, murmurs, rubs, gallops or clicks.  Trace left leg edema.  Telemetry personally reviewed: Sinus tachycardia in the 100s with BBB morphology. Gastrointestinal system: Abdomen is nondistended, soft and nontender. No organomegaly or masses felt. Normal bowel sounds heard. Central nervous system: Alert and oriented. No focal neurological deficits. Extremities: Symmetric 5 x 5 power.  Right BKA healed stump with sock.  Left second and third toe amputation sites healed. Left first toe gangrene with forefoot erythema.  Area of induration/fluctuation on the plantar aspect of the ball of left first toe.  Absent right dorsalis pedis and posterior tibial pulsations. Skin: No rashes, lesions or ulcers Psychiatry: Judgement and insight appear normal. Mood & affect appropriate.     Data Reviewed: I have personally reviewed following labs and imaging studies  CBC: Recent Labs  Lab 08/06/19 2058 08/07/19 0529  WBC 27.3* 27.7*  NEUTROABS 23.4*  --   HGB 12.0 10.9*  HCT 37.0 35.0*  MCV 94.9 95.9  PLT 965* 161*   Basic Metabolic Panel: Recent Labs  Lab 08/06/19 2058 08/07/19 0529  NA 133* 131*  K 3.3* 3.2*  CL 95* 97*  CO2 10* 10*  GLUCOSE 107* 75  BUN 102* 101*  CREATININE 5.48* 5.27*  CALCIUM 7.5* 7.3*  MG 2.3  --    Liver Function Tests: Recent Labs  Lab 08/06/19 2235  AST 10*  ALT 7  ALKPHOS 162*  BILITOT 0.9  PROT 8.0  ALBUMIN 2.9*    Cardiac Enzymes: No results for input(s): CKTOTAL, CKMB, CKMBINDEX, TROPONINI in the last 168 hours.  CBG: Recent Labs  Lab 08/07/19 0631 08/07/19 0805 08/07/19 1211  GLUCAP 80 71 77    Recent Results (from the past 240 hour(s))  Blood culture (routine x 2)     Status: None (Preliminary result)   Collection Time: 08/06/19  8:24 PM   Specimen: BLOOD  Result Value Ref Range Status   Specimen Description BLOOD RIGHT ANTECUBITAL  Final   Special Requests    Final    BOTTLES DRAWN AEROBIC AND ANAEROBIC Blood Culture adequate volume   Culture   Final    NO GROWTH < 12 HOURS Performed at [facility], [place] [address] [place], [place] [address]    Report Status PENDING  Incomplete  Blood culture (routine x 2)     Status: None (Preliminary result)   Collection Time: 08/06/19  9:44 PM   Specimen: BLOOD RIGHT HAND  Result Value Ref Range Status   Specimen Description BLOOD RIGHT HAND  Final   Special Requests   Final    BOTTLES DRAWN AEROBIC ONLY Blood Culture results may not be optimal due to an inadequate volume of blood received in culture bottles   Culture   Final    NO GROWTH < 12 HOURS Performed at [facility], [place] [address] [place], [place] [address]    Report Status PENDING  Incomplete  SARS CORONAVIRUS 2 (TAT 6-24 HRS) Nasopharyngeal Nasopharyngeal Swab     Status: None   Collection Time: 08/06/19 11:05 PM   Specimen: Nasopharyngeal Swab  Result Value Ref Range Status   SARS Coronavirus 2 NEGATIVE NEGATIVE Final    Comment: (NOTE) SARS-CoV-2 target nucleic acids are NOT DETECTED. The SARS-CoV-2 RNA is generally detectable in upper and lower respiratory specimens during the acute phase of infection. Negative results do not preclude SARS-CoV-2 infection, do not rule out co-infections with other pathogens, and should not be used as the sole basis for treatment or other patient management decisions. Negative results must be combined with clinical observations, patient history, and epidemiological information. The expected result is Negative. Fact Sheet for Patients: SugarRoll.be Fact Sheet for Healthcare Providers: https://www.woods-mathews.com/ This test is not yet approved or cleared by the [place] FDA and  has been authorized for detection and/or diagnosis of SARS-CoV-2 by FDA under an Emergency Use Authorization (EUA). This EUA will remain  in effect (meaning this  test can be used) for the duration of the COVID-19 declaration under Section 56 4(b)(1) of the Act, 21 U.S.C. section 360bbb-3(b)(1), unless the authorization is terminated or revoked sooner. Performed at [facility], [place] [address] [place], [place] [address]          Radiology Studies: Dg Chest 2 View  Result Date: 08/06/2019 CLINICAL DATA:  Shortness of breath.  Missed dialysis. EXAM: CHEST - 2 VIEW COMPARISON:  Feb 15, 2019 FINDINGS: There is a well-positioned tunneled dialysis catheter. There is cardiomegaly with vascular congestion and early pulmonary edema. No large pleural effusion. There is no focal infiltrate. No acute osseous abnormality. IMPRESSION: 1. Cardiomegaly with vascular congestion and possible early edema. 2. Well-positioned tunneled dialysis catheter. Electronically Signed   By: Constance Holster M.D.   On: 08/06/2019 22:18   Dg Foot Complete Left  Result Date: 08/06/2019 CLINICAL DATA:  Acute on chronic pain in left great toe. Chronic wound with positive erythema and warmth. EXAM: LEFT FOOT - COMPLETE 3+ VIEW COMPARISON:  06/17/2019 FINDINGS: Findings are consistent with osteomyelitis involving the proximal phalanx of the first digit. There is subtle lucencies involving the medial aspect of the first metatarsal head. Pockets of subcutaneous gas and soft tissue swelling is noted. There is a lucency involving the tuft of the distal phalanx of the first digit. The patient is status post amputation of the second and third digits as before. There is a large plantar calcaneal spur. There is an Achilles tendon enthesophyte. IMPRESSION: 1. Overall findings consistent with osteomyelitis of the proximal and distal phalanges of the first digit. 2. Questionable osteomyelitis involving the head of the first metatarsal. This can be further evaluated with a contrast enhanced MRI. 3. Soft tissue infection with multiple pockets of subcutaneous gas. Electronically Signed   By:  Constance Holster M.D.   On: 08/06/2019 22:16        Scheduled Meds: . atorvastatin  20 mg Oral QHS  . calcium acetate  667 mg Oral TID WC  . carvedilol  25 mg Oral BID  . Chlorhexidine Gluconate Cloth  6 each Topical Q0600  . hydrALAZINE  25 mg Oral BID  . pantoprazole  40 mg Oral QHS  . saccharomyces boulardii  250 mg Oral QHS   Continuous Infusions: . heparin 1,100 Units/hr (08/07/19 0630)  . piperacillin-tazobactam (ZOSYN)  IV    . [START ON 08/08/2019] vancomycin       LOS: 1 day     Vernell Leep, MD, FACP, [facility]  To contact the attending provider between 7A-7P or the covering provider during after hours 7P-7A, please log into the web site  [email or web address] and access using universal [place] password for that web site. If you do not have the password, please call the hospital operator.  08/07/2019, 12:20 PM

## 2019-08-07 NOTE — Consult Note (Signed)
Renal Service Consult Note [facility]  Cecylia Brazill [facility] 08/07/2019 Sol Blazing Requesting Physician:  Dr Algis Liming  Reason for Consult: ESRD pt w/ gangrene HPI: The patient is a 63 y.o. year-old w/ hx of DM, CVA, HTN, HL, CHF, esrd on HD TTS presented to ED after missing HD x 3, not feeling well, nothing specific. Labs show high B/Cr.  L great toe known gangrene is worse, patient was admitted. Asked to see for ESRD.    Patient says just "doesn't feel right", appetite poor, no n/v/d, no abd pain or CP no SOB or cough. Seen here by Dr Sharol Given, will consult VVS for question of revasc.  Pt states she has missed HD because the [employer] drivers will not reliably take her in her WC down the ramp due to liability. She says her daughter that was living w/ her moved to [place] and she is now living alone. She is divorced x 20 yrs.     ROS  denies CP  no joint pain   no HA  no blurry vision  no rash  no diarrhea  no nausea/ vomiting     Past Medical History  Past Medical History:  Diagnosis Date  . Anemia   . Arthritis    "hands, knees" (10/10/2017)  . Asthma   . CHF (congestive heart failure) ([place])   . Chronic kidney disease (CKD), stage IV (severe) ([place])    Dialysis T/ Th/ Sat  . Coronary artery disease   . H/O Clostridium difficile infection   . High cholesterol   . History of kidney stones   . Hypertension   . PVD (peripheral vascular disease) ([place])    "LLE; will have OR" (10/10/2017)  . Sleep apnea    "never given mask" (10/10/2017)  . Stroke [facility])    mild stroke mid April - 2019  . Type II diabetes mellitus ([place])    "no RX anymore" (10/10/2017)   Past Surgical History  Past Surgical History:  Procedure Laterality Date  . A/V FISTULAGRAM N/A 12/27/2018   Procedure: A/V FISTULAGRAM - Left Upper;  Surgeon: Serafina Mitchell, MD;  Location: [place] [facility];  Service: Cardiovascular;  Laterality: N/A;  . A/V FISTULAGRAM Left 05/22/2019   Procedure: A/V FISTULAGRAM;   Surgeon: Serafina Mitchell, MD;  Location: [place] [facility];  Service: Cardiovascular;  Laterality: Left;  . AMPUTATION Left 11/10/2017   Procedure: AMPUTATION DIGIT SECOND TOE LEFT FOOT;  Surgeon: Waynetta Sandy, MD;  Location: [place];  Service: Vascular;  Laterality: Left;  . AMPUTATION Left 01/24/2018   Procedure: AMPUTATION THIRD TOE LEFT FOOT;  Surgeon: Waynetta Sandy, MD;  Location: [place];  Service: Vascular;  Laterality: Left;  . AMPUTATION Right 12/22/2018   Procedure: Right foot 1st ray amputation;  Surgeon: Newt Minion, MD;  Location: [place];  Service: Orthopedics;  Laterality: Right;  . AMPUTATION Right 01/05/2019   Procedure: RIGHT BELOW KNEE AMPUTATION;  Surgeon: Newt Minion, MD;  Location: [place];  Service: Orthopedics;  Laterality: Right;  . AORTOGRAM N/A 11/03/2017   Procedure: Ultrasound Guided Cannulation Right Common Femoral Artery;  Aortagram with Left Lower Extremity Arteriogram; Attempted Treatment Left Superficial Femoral Artery; Percutaneous Closure Right Common Femoral Arteriotomy with Proglide Device;  Surgeon: Waynetta Sandy, MD;  Location: [place];  Service: Vascular;  Laterality: N/A;  . APPLICATION OF WOUND VAC Right 01/05/2019   Procedure: Application Of Wound Vac;  Surgeon: Newt Minion, MD;  Location: [facility]  OR;  Service: Orthopedics;  Laterality: Right;  . AV FISTULA PLACEMENT Left 11/10/2017   Procedure: ARTERIOVENOUS (AV) FISTULA CREATION LEFT UPPER ARM;  Surgeon: Waynetta Sandy, MD;  Location: [place];  Service: Vascular;  Laterality: Left;  . EXCHANGE OF A DIALYSIS CATHETER Right 02/15/2019   Procedure: EXCHANGE OF A TUNNELED DIALYSIS CATHETER;  Surgeon: Serafina Mitchell, MD;  Location: [place];  Service: Vascular;  Laterality: Right;  . FISTULA SUPERFICIALIZATION Left 02/15/2019   Procedure: SUPERFICIALIZATION WITH BRANCH LIGATION BRACHIOCEPHALIC ARTERIOVENOUS FISTULA LEFT ARM;  Surgeon: Serafina Mitchell, MD;  Location: [place];   Service: Vascular;  Laterality: Left;  . HERNIA REPAIR  1950s  . INSERTION OF DIALYSIS CATHETER Right 11/10/2017   Procedure: INSERTION OF TUNNELED DIALYSIS CATHETER RIGHT INTERNAL JUGULAR PLACEMENT;  Surgeon: Waynetta Sandy, MD;  Location: [place];  Service: Vascular;  Laterality: Right;  . IR FLUORO GUIDE CV LINE RIGHT  10/19/2017  . IR US GUIDE VASC ACCESS RIGHT  10/19/2017  . LEFT HEART CATH AND CORONARY ANGIOGRAPHY N/A 11/01/2017   Procedure: LEFT HEART CATH AND CORONARY ANGIOGRAPHY;  Surgeon: Belva Crome, MD;  Location: [place] [facility];  Service: Cardiovascular;  Laterality: N/A;  . LOWER EXTREMITY ANGIOGRAM Left 02/15/2019   Procedure: ANGIOGRAM LEFT LOWER EXTREMITY;  Surgeon: Serafina Mitchell, MD;  Location: [place];  Service: Vascular;  Laterality: Left;  . LOWER EXTREMITY ANGIOGRAPHY N/A 12/27/2018   Procedure: LOWER EXTREMITY ANGIOGRAPHY - Right Lower;  Surgeon: Serafina Mitchell, MD;  Location: [place] [facility];  Service: Cardiovascular;  Laterality: N/A;  . PERIPHERAL VASCULAR BALLOON ANGIOPLASTY Left 05/22/2019   Procedure: PERIPHERAL VASCULAR BALLOON ANGIOPLASTY;  Surgeon: Serafina Mitchell, MD;  Location: [place] [facility];  Service: Cardiovascular;  Laterality: Left;  fistula  . PERIPHERAL VASCULAR INTERVENTION Left 11/07/2017   Procedure: PERIPHERAL VASCULAR INTERVENTION;  Surgeon: Waynetta Sandy, MD;  Location: [place] [facility];  Service: Cardiovascular;  Laterality: Left;  left SFA  . PERIPHERAL VASCULAR INTERVENTION Right 12/27/2018   Procedure: PERIPHERAL VASCULAR INTERVENTION;  Surgeon: Serafina Mitchell, MD;  Location: [place] [facility];  Service: Cardiovascular;  Laterality: Right;  SFA  . SHOULDER OPEN ROTATOR CUFF REPAIR Left   . TUBAL LIGATION    . ULTRASOUND GUIDANCE FOR VASCULAR ACCESS  11/01/2017   Procedure: Ultrasound Guidance For Vascular Access;  Surgeon: Belva Crome, MD;  Location: [place] [facility];  Service: Cardiovascular;;  . ULTRASOUND  GUIDANCE FOR VASCULAR ACCESS Right 02/15/2019   Procedure: Ultrasound Guidance For Vascular Access;  Surgeon: Serafina Mitchell, MD;  Location: [facility];  Service: Vascular;  Laterality: Right;   Family History  Family History  Problem Relation Age of Onset  . Hypertension Father   . Heart failure Maternal Grandmother   . Heart failure Maternal Grandfather    Social History  reports that she has never smoked. She has never used smokeless tobacco. She reports that she does not drink alcohol or use drugs. Allergies  Allergies  Allergen Reactions  . Crestor [Rosuvastatin Calcium] Other (See Comments)    Leg pain   Home medications Prior to Admission medications   Medication Sig Start Date End Date Taking? Authorizing Provider  acetaminophen (TYLENOL) 500 MG tablet Take 2,000 mg by mouth every 4 (four) hours as needed (pain).    Yes [provider]  albuterol (PROAIR HFA) 108 (90 Base) MCG/ACT inhaler Inhale 2 puffs into the lungs every 6 (six) hours as needed for  wheezing or shortness of breath.    Yes [provider]  amLODipine (NORVASC) 5 MG tablet Take 5 mg by mouth at bedtime.    Yes [provider]  apixaban (ELIQUIS) 2.5 MG TABS tablet Take 1 tablet (2.5 mg total) by mouth 2 (two) times daily. 12/30/18  Yes Bonnita Hollow, MD  atorvastatin (LIPITOR) 20 MG tablet Take 1 tablet (20 mg total) by mouth daily at 6 PM. Patient taking differently: Take 20 mg by mouth at bedtime.  11/16/17  Yes Domenic Polite, MD  calcium acetate (PHOSLO) 667 MG capsule Take 667 mg by mouth See admin instructions. Take one capsule (667 mg) by mouth up to three times daily with meals 10/27/18  Yes [provider]  carvedilol (COREG) 25 MG tablet Take 25 mg by mouth 2 (two) times daily. 08/26/18  Yes [provider]  clotrimazole-betamethasone (LOTRISONE) cream Apply 1 application topically daily as needed (rash).  01/19/18  Yes [provider]   diphenoxylate-atropine (LOMOTIL) 2.5-0.025 MG tablet Take 1 tablet by mouth every 12 (twelve) hours as needed for diarrhea or loose stools.    Yes [provider]  hydrALAZINE (APRESOLINE) 25 MG tablet Take 25 mg by mouth 2 (two) times daily.  01/25/18  Yes Rhyne, Hulen Shouts, PA-C  Melatonin 5 MG CAPS Take 5 mg by mouth at bedtime. 07/30/19  Yes [provider]  pantoprazole (PROTONIX) 40 MG tablet Take 40 mg by mouth at bedtime.    Yes [provider]  saccharomyces boulardii (FLORASTOR) 250 MG capsule Take 1 capsule (250 mg total) by mouth 2 (two) times daily. Patient taking differently: Take 250 mg by mouth at bedtime.  01/12/18  Yes Caren Griffins, MD  ticagrelor (BRILINTA) 90 MG TABS tablet Take 90 mg by mouth 2 (two) times daily.    Yes [provider]  HYDROcodone-acetaminophen (NORCO) 5-325 MG tablet Take 1 tablet by mouth every 6 (six) hours as needed for moderate pain. Patient not taking: Reported on 08/06/2019 02/15/19   Dagoberto Ligas, PA-C  pentafluoroprop-tetrafluoroeth Landry Dyke) AERO Apply 1 application topically as needed (topical anesthesia for hemodialysis). Patient not taking: Reported on 08/06/2019 12/30/18   Bonnita Hollow, MD  polyethylene glycol [facility] / Floria Raveling) packet Take 17 g by mouth daily as needed for mild constipation. Patient not taking: Reported on 08/06/2019 12/30/18   Bonnita Hollow, MD   Liver Function Tests Recent Labs  Lab 08/06/19 2235  AST 10*  ALT 7  ALKPHOS 162*  BILITOT 0.9  PROT 8.0  ALBUMIN 2.9*   No results for input(s): LIPASE, AMYLASE in the last 168 hours. CBC Recent Labs  Lab 08/06/19 2058 08/07/19 0529  WBC 27.3* 27.7*  NEUTROABS 23.4*  --   HGB 12.0 10.9*  HCT 37.0 35.0*  MCV 94.9 95.9  PLT 965* 956*   Basic Metabolic Panel Recent Labs  Lab 08/06/19 2058 08/07/19 0529  NA 133* 131*  K 3.3* 3.2*  CL 95* 97*  CO2 10* 10*  GLUCOSE 107* 75  BUN 102* 101*  CREATININE 5.48* 5.27*   CALCIUM 7.5* 7.3*   Iron/TIBC/Ferritin/ %Sat    Component Value Date/Time   IRON 37 10/18/2017 0507   TIBC 209 (L) 10/18/2017 0507   FERRITIN 118 10/18/2017 0507   IRONPCTSAT 18 10/18/2017 0507    Vitals:   08/07/19 0630 08/07/19 0700 08/07/19 0730 08/07/19 0930  BP: (!) 104/46 119/62 129/85 131/65  Pulse: (!) 122 (!) 123 80 (!) 105  Resp: 19  17 (!) 21 19  Temp:    97.9 F (36.6 C)  TempSrc:    Oral  SpO2: 99% 99% 99% 100%  Weight:        Exam Gen alert, no distress, calm No rash, cyanosis or gangrene Sclera anicteric, throat clear  +mild jvd Chest clear bilat on rales/ wheezes RRR no MRG Abd soft ntnd no mass or ascites +bs obese GU defer MS R BKA Ext trace pretib edema, no wounds or ulcers Neuro is alert, Ox 3 , nf R TDC,  LUA AVF+bruit (not using)    Home meds:  - amlodipine 5/ carvedilol 25 bid/ hydralazine 25 bid  - apixaban 2.5 bid  - atorvastatin 20 hs/ ticagrelor 90 bid  - pantoprazole 40 hs/ calc acetate ac tid  - prn's/ vitamins/ supplements     Outpt HD: MWF [facility]   4h   350/800   80kg   2/2.25 bath  TDC   Heparin 2400  - LUE AVF (poorly matured, not using)  - calc 0.25 tiw   Assessment/ Plan: 1. ESRD - missed one week HD, living alone since dtr moved out and doesn't have reliable transportation due to [place] dependence.  Plan HD today and tomorrow and consult renal SW.   2. HTN/ vol - BP's good, cont meds, UF 2L on HD today, check wts 3. H/o CVA 4. Chron anticoag - on eliquis, not sure reason 5. Anemia ckd - Hb 11 , follow      [name]  MD 08/07/2019, 10:06 AM

## 2019-08-07 NOTE — Progress Notes (Signed)
Upon arrival to floor, pt. Expressed wanting to vomit. Pt vomited 30 ml of dark green watery vomit. MD notified. Night shift nurse aware. Will continue to monitor.

## 2019-08-07 NOTE — Consult Note (Addendum)
[facility] NOTE   MRN : [id number]  Reason for Consult: Critical left LE ischemia with gangrene Referring Physician: Dr. Sharol Given  History of Present Illness: 63 y/o female presents with left lower extremity presents with pain, cellulitis and gangrenous changes to the left GT/forefoot.  We have been asked to evaluate her arterial blood flow for  revascularization options verses BKA or AKA  to the left lower extremity.  She has had previous angiography studies that resulted in right LE amputation below the knee.  She had occlusion of the superficial femoral artery with reconstitution of the popliteal artery.  She underwent successful angioplasty and stenting of the right superficial femoral artery and popliteal artery.  There was single-vessel runoff on the right via the anterior tibial artery.    Previous angiography of the left LE 11/03/2017 Findings: Aorta and iliac segments are free of disease.  On the left lower extremity common femoral artery also appears free of flow-limiting stenosis the profunda is the dominant runoff in the thigh.  The SFA is very diminutive proximally and occludes mid thigh.  It reconstitutes above the popliteal artery.  Runoff to the ankle was via the anterior tibial artery and is brisk could not reach the foot.  Dr. Donzetta Matters performed amputation of the left third toe.  Left 2nd toe amp site healing well and AT is palpable.    02/15/2019 Dr. Trula Slade repeated Aortogram with left LE run off which showed stents within her superficial femoral artery which were previously patent but stenotic had now occluded.  There is reconstitution of above-knee popliteal artery with single-vessel runoff via the anterior tibial artery.    ESRD with right [facility] and left UE s/p post fistulogram by Dr. Trula Slade 05/22/2019 showed The central venous system is widely patent.  The cephalic vein fistula is widely patent throughout the upper arm.  There does appear to be a  significant caliber change at the arterial venous anastomosis.  There also is a small appearing brachial artery.  Intervention angioplasty.  Now failed left UE fistula     Current Facility-Administered Medications  Medication Dose Route Frequency Provider Last Rate Last Dose  . acetaminophen (TYLENOL) tablet 650 mg  650 mg Oral Q6H PRN Rise Patience, MD       Or  . acetaminophen (TYLENOL) suppository 650 mg  650 mg Rectal Q6H PRN Rise Patience, MD      . atorvastatin (LIPITOR) tablet 20 mg  20 mg Oral QHS Rise Patience, MD      . calcium acetate (PHOSLO) capsule 667 mg  667 mg Oral TID WC Rise Patience, MD      . carvedilol (COREG) tablet 25 mg  25 mg Oral BID Rise Patience, MD   25 mg at 08/07/19 0932  . Chlorhexidine Gluconate Cloth 2 % PADS 6 each  6 each Topical Q0600 Collins, Samantha G, PA-C      . heparin ADULT infusion 100 units/mL (25000 units/269mL sodium chloride 0.45%)  1,100 Units/hr Intravenous Continuous Rise Patience, MD 11 mL/hr at 08/07/19 0630 1,100 Units/hr at 08/07/19 0630  . hydrALAZINE (APRESOLINE) tablet 25 mg  25 mg Oral BID Rise Patience, MD   25 mg at 08/07/19 0932  . pantoprazole (PROTONIX) EC tablet 40 mg  40 mg Oral QHS Rise Patience, MD      . piperacillin-tazobactam (ZOSYN) IVPB 2.25 g  2.25 g Intravenous Q8H Romona Curls,       .  saccharomyces boulardii (FLORASTOR) capsule 250 mg  250 mg Oral QHS Rise Patience, MD      . Derrill Memo ON 08/08/2019] vancomycin (VANCOCIN) IVPB 750 mg/150 ml premix  750 mg Intravenous Q M,W,F-HD Rise Patience, MD       Current Outpatient Medications  Medication Sig Dispense Refill  . acetaminophen (TYLENOL) 500 MG tablet Take 2,000 mg by mouth every 4 (four) hours as needed (pain).     [name] albuterol (PROAIR HFA) 108 (90 Base) MCG/ACT inhaler Inhale 2 puffs into the lungs every 6 (six) hours as needed for wheezing or shortness of breath.     [name] amLODipine (NORVASC)  5 MG tablet Take 5 mg by mouth at bedtime.     [name] apixaban (ELIQUIS) 2.5 MG TABS tablet Take 1 tablet (2.5 mg total) by mouth 2 (two) times daily. 60 tablet   . atorvastatin (LIPITOR) 20 MG tablet Take 1 tablet (20 mg total) by mouth daily at 6 PM. (Patient taking differently: Take 20 mg by mouth at bedtime. ) 30 tablet 0  . calcium acetate (PHOSLO) 667 MG capsule Take 667 mg by mouth See admin instructions. Take one capsule (667 mg) by mouth up to three times daily with meals    . carvedilol (COREG) 25 MG tablet Take 25 mg by mouth 2 (two) times daily.    . clotrimazole-betamethasone (LOTRISONE) cream Apply 1 application topically daily as needed (rash).   0  . diphenoxylate-atropine (LOMOTIL) 2.5-0.025 MG tablet Take 1 tablet by mouth every 12 (twelve) hours as needed for diarrhea or loose stools.     . hydrALAZINE (APRESOLINE) 25 MG tablet Take 25 mg by mouth 2 (two) times daily.     . Melatonin 5 MG CAPS Take 5 mg by mouth at bedtime.    . pantoprazole (PROTONIX) 40 MG tablet Take 40 mg by mouth at bedtime.     . saccharomyces boulardii (FLORASTOR) 250 MG capsule Take 1 capsule (250 mg total) by mouth 2 (two) times daily. (Patient taking differently: Take 250 mg by mouth at bedtime. ) 20 capsule 0  . ticagrelor (BRILINTA) 90 MG TABS tablet Take 90 mg by mouth 2 (two) times daily.     [name] HYDROcodone-acetaminophen (NORCO) 5-325 MG tablet Take 1 tablet by mouth every 6 (six) hours as needed for moderate pain. (Patient not taking: Reported on 08/06/2019) 12 tablet 0  . pentafluoroprop-tetrafluoroeth (GEBAUERS) AERO Apply 1 application topically as needed (topical anesthesia for hemodialysis). (Patient not taking: Reported on 08/06/2019)  0  . polyethylene glycol (MIRALAX / GLYCOLAX) packet Take 17 g by mouth daily as needed for mild constipation. (Patient not taking: Reported on 08/06/2019) 14 each 0    Pt meds include: Statin :Yes Betablocker: Yes ASA: No Other anticoagulants/antiplatelets: Brilinta  and Eliquis now on hold.  Past Medical History:  Diagnosis Date  . Anemia   . Arthritis    "hands, knees" (10/10/2017)  . Asthma   . CHF (congestive heart failure) ([place])   . Coronary artery disease   . ESRD on hemodialysis [facility])    Dialysis T/ Th/ Sat  . H/O Clostridium difficile infection   . High cholesterol   . History of kidney stones   . Hypertension   . PVD (peripheral vascular disease) ([place])    "LLE; will have OR" (10/10/2017)  . Sleep apnea    "never given mask" (10/10/2017)  . Stroke [facility])    mild stroke mid April - 2019  . Type II diabetes mellitus (  [place])    "no RX anymore" (10/10/2017)    Past Surgical History:  Procedure Laterality Date  . A/V FISTULAGRAM N/A 12/27/2018   Procedure: A/V FISTULAGRAM - Left Upper;  Surgeon: Serafina Mitchell, MD;  Location: [place] CV LAB;  Service: Cardiovascular;  Laterality: N/A;  . A/V FISTULAGRAM Left 05/22/2019   Procedure: A/V FISTULAGRAM;  Surgeon: Serafina Mitchell, MD;  Location: [place] [facility];  Service: Cardiovascular;  Laterality: Left;  . AMPUTATION Left 11/10/2017   Procedure: AMPUTATION DIGIT SECOND TOE LEFT FOOT;  Surgeon: Waynetta Sandy, MD;  Location: [place];  Service: Vascular;  Laterality: Left;  . AMPUTATION Left 01/24/2018   Procedure: AMPUTATION THIRD TOE LEFT FOOT;  Surgeon: Waynetta Sandy, MD;  Location: [place];  Service: Vascular;  Laterality: Left;  . AMPUTATION Right 12/22/2018   Procedure: Right foot 1st ray amputation;  Surgeon: Newt Minion, MD;  Location: [place];  Service: Orthopedics;  Laterality: Right;  . AMPUTATION Right 01/05/2019   Procedure: RIGHT BELOW KNEE AMPUTATION;  Surgeon: Newt Minion, MD;  Location: [place];  Service: Orthopedics;  Laterality: Right;  . AORTOGRAM N/A 11/03/2017   Procedure: Ultrasound Guided Cannulation Right Common Femoral Artery;  Aortagram with Left Lower Extremity Arteriogram; Attempted Treatment Left Superficial Femoral Artery; Percutaneous Closure  Right Common Femoral Arteriotomy with Proglide Device;  Surgeon: Waynetta Sandy, MD;  Location: [place];  Service: Vascular;  Laterality: N/A;  . APPLICATION OF WOUND VAC Right 01/05/2019   Procedure: Application Of Wound Vac;  Surgeon: Newt Minion, MD;  Location: [place];  Service: Orthopedics;  Laterality: Right;  . AV FISTULA PLACEMENT Left 11/10/2017   Procedure: ARTERIOVENOUS (AV) FISTULA CREATION LEFT UPPER ARM;  Surgeon: Waynetta Sandy, MD;  Location: [place];  Service: Vascular;  Laterality: Left;  . EXCHANGE OF A DIALYSIS CATHETER Right 02/15/2019   Procedure: EXCHANGE OF A TUNNELED DIALYSIS CATHETER;  Surgeon: Serafina Mitchell, MD;  Location: [place];  Service: Vascular;  Laterality: Right;  . FISTULA SUPERFICIALIZATION Left 02/15/2019   Procedure: SUPERFICIALIZATION WITH BRANCH LIGATION BRACHIOCEPHALIC ARTERIOVENOUS FISTULA LEFT ARM;  Surgeon: Serafina Mitchell, MD;  Location: [name];  Service: Vascular;  Laterality: Left;  . HERNIA REPAIR  1950s  . INSERTION OF DIALYSIS CATHETER Right 11/10/2017   Procedure: INSERTION OF TUNNELED DIALYSIS CATHETER RIGHT INTERNAL JUGULAR PLACEMENT;  Surgeon: Waynetta Sandy, MD;  Location: [place];  Service: Vascular;  Laterality: Right;  . IR FLUORO GUIDE CV LINE RIGHT  10/19/2017  . IR US GUIDE VASC ACCESS RIGHT  10/19/2017  . LEFT HEART CATH AND CORONARY ANGIOGRAPHY N/A 11/01/2017   Procedure: LEFT HEART CATH AND CORONARY ANGIOGRAPHY;  Surgeon: Belva Crome, MD;  Location: [place] CV LAB;  Service: Cardiovascular;  Laterality: N/A;  . LOWER EXTREMITY ANGIOGRAM Left 02/15/2019   Procedure: ANGIOGRAM LEFT LOWER EXTREMITY;  Surgeon: Serafina Mitchell, MD;  Location: [place];  Service: Vascular;  Laterality: Left;  . LOWER EXTREMITY ANGIOGRAPHY N/A 12/27/2018   Procedure: LOWER EXTREMITY ANGIOGRAPHY - Right Lower;  Surgeon: Serafina Mitchell, MD;  Location: [place] CV LAB;  Service: Cardiovascular;  Laterality: N/A;  . PERIPHERAL  VASCULAR BALLOON ANGIOPLASTY Left 05/22/2019   Procedure: PERIPHERAL VASCULAR BALLOON ANGIOPLASTY;  Surgeon: Serafina Mitchell, MD;  Location: [place] CV [facility];  Service: Cardiovascular;  Laterality: Left;  fistula  . PERIPHERAL VASCULAR INTERVENTION Left 11/07/2017   Procedure: PERIPHERAL VASCULAR INTERVENTION;  Surgeon: Waynetta Sandy, MD;  Location: [place] CV [facility];  Service: Cardiovascular;  Laterality: Left;  left SFA  . PERIPHERAL VASCULAR INTERVENTION Right 12/27/2018   Procedure: PERIPHERAL VASCULAR INTERVENTION;  Surgeon: Serafina Mitchell, MD;  Location: [place] CV LAB;  Service: Cardiovascular;  Laterality: Right;  SFA  . SHOULDER OPEN ROTATOR CUFF REPAIR Left   . TUBAL LIGATION    . ULTRASOUND GUIDANCE FOR VASCULAR ACCESS  11/01/2017   Procedure: Ultrasound Guidance For Vascular Access;  Surgeon: Belva Crome, MD;  Location: [place] CV LAB;  Service: Cardiovascular;;  . ULTRASOUND GUIDANCE FOR VASCULAR ACCESS Right 02/15/2019   Procedure: Ultrasound Guidance For Vascular Access;  Surgeon: Serafina Mitchell, MD;  Location: [facility] OR;  Service: Vascular;  Laterality: Right;    Social History Social History   Tobacco Use  . Smoking status: Never Smoker  . Smokeless tobacco: Never Used  Substance Use Topics  . Alcohol use: No  . Drug use: No    Family History Family History  Problem Relation Age of Onset  . Hypertension Father   . Heart failure Maternal Grandmother   . Heart failure Maternal Grandfather     Allergies  Allergen Reactions  . Crestor [Rosuvastatin Calcium] Other (See Comments)    Leg pain     REVIEW OF SYSTEMS  General: [ ]  Weight loss, [ ]  Fever, [ ]  chills Neurologic: [ ]  Dizziness, [ ]  Blackouts, [ ]  Seizure [ ]  Stroke, [ ]  "Mini stroke", [ ]  Slurred speech, [ ]  Temporary blindness; [ ]  weakness in arms or legs, [ ]  Hoarseness [ ]  Dysphagia Cardiac: [ ]  Chest pain/pressure, [ ]  Shortness of breath at rest [ ]  Shortness of breath with  exertion, [ ]  Atrial fibrillation or irregular heartbeat  Vascular: [ ]  Pain in legs with walking, [x ] Pain in legs at rest, [ ]  Pain in legs at night,  [ ]  Non-healing ulcer, [ ]  Blood clot in vein/DVT,   Pulmonary: [ ]  Home oxygen, [ ]  Productive cough, [ ]  Coughing up blood, [ ]  Asthma,  [ ]  Wheezing [ ]  COPD Musculoskeletal:  [ ]  Arthritis, [ ]  Low back pain, [ ]  Joint pain Hematologic: [ ]  Easy Bruising, [ ]  Anemia; [ ]  Hepatitis Gastrointestinal: [ ]  Blood in stool, [ ]  Gastroesophageal Reflux/heartburn, Urinary: [ ]  chronic Kidney disease, [x ] on HD - [ ]  MWF or [ ]  TTHS, [ ]  Burning with urination, [ ]  Difficulty urinating Skin: [ ]  Rashes, [x ] Wounds Psychological: [ ]  Anxiety, [ ]  Depression  Physical Examination Vitals:   08/07/19 0730 08/07/19 0830 08/07/19 0930 08/07/19 1030  BP: 129/85 (!) 147/70 131/65 121/67  Pulse: 80 (!) 109 (!) 104 (!) 108  Resp: (!) 21 20 16  (!) 24  Temp:   97.9 F (36.6 C)   TempSrc:   Oral   SpO2: 99% 100% 99% 98%  Weight:       Body mass index is 25.54 kg/m.  General:  WDWN in NAD HENT: WNL Eyes: Pupils equal Pulmonary: normal non-labored breathing , without Rales, rhonchi,  wheezing Cardiac: RRR, without  Murmurs, rubs or gallops; No carotid bruits Abdomen: soft, NT, no masses Skin: no rashes, ulcers noted;  Positive left GT Gangrene , positive left foot cellulitis; no open wounds;   Vascular Exam/Pulses:palpable femoral pulse left , well healed right BKA   Musculoskeletal: no muscle wasting or atrophy; no edema  Neurologic: A&O X 3; Appropriate Affect ;  SENSATION: normal; MOTOR FUNCTION: left  ankle motor intact Speech is fluent/normal   Significant Diagnostic Studies: CBC Lab Results  Component Value Date   WBC 27.7 (H) 08/07/2019   HGB 10.9 (L) 08/07/2019   HCT 35.0 (L) 08/07/2019   MCV 95.9 08/07/2019   PLT 893 (H) 08/07/2019    BMET    Component Value Date/Time   NA 131 (L) 08/07/2019 0529   NA 137  12/14/2016 1139   K 3.2 (L) 08/07/2019 0529   K 4.2 12/14/2016 1139   CL 97 (L) 08/07/2019 0529   CO2 10 (L) 08/07/2019 0529   CO2 20 (L) 12/14/2016 1139   GLUCOSE 75 08/07/2019 0529   GLUCOSE 115 12/14/2016 1139   BUN 101 (H) 08/07/2019 0529   BUN 32.8 (H) 12/14/2016 1139   CREATININE 5.27 (H) 08/07/2019 0529   CREATININE 2.1 (H) 12/14/2016 1139   CALCIUM 7.3 (L) 08/07/2019 0529   CALCIUM 8.3 (L) 11/11/2017 0740   CALCIUM 9.5 12/14/2016 1139   GFRNONAA 8 (L) 08/07/2019 0529   GFRAA 9 (L) 08/07/2019 0529   Estimated Creatinine Clearance: 10.9 mL/min (A) (by C-G formula based on SCr of 5.27 mg/dL (H)).  COAG Lab Results  Component Value Date   INR 1.6 (H) 08/07/2019   INR 1.2 06/25/2019   INR 1.1 02/15/2019     Non-Invasive Vascular Imaging: pending vein mapping  ASSESSMENT/PLAN:  PAD with left GT gangrene and left foot cellulitis She is being transitioned form oral anticoagulation and now on heparin.   Plan will be left Fem-to below knee pop bypass and amputation of left GT.   Cont. Vancomycin and Zosyn   Roxy Horseman 08/07/2019 10:54 AM   I agree with the above.  I have seen and evaluated patient.  Briefly this is a 63 year old female well-known to me and my partners for severe peripheral vascular disease in the setting of poorly controlled diabetes and hypertension as well as end-stage renal disease needing dialysis.  She is already undergone amputation of the right leg.  She has had percutaneous intervention to the left leg which has occluded.  She now has a dry eschar and cellulitis to her left great toe.  She underwent angiography a few months ago which showed she needed a femoral to below-knee popliteal artery bypass graft should she require additional blood flow for limb salvage.  She is at that point now.  We discussed that she would need a left femoral below-knee popliteal bypass graft with vein or Gore-Tex depending on what her saphenous vein looks like on  vein mapping.  At the time of her operation she has also agreed to have her great toe amputated.  She is on Eliquis and Brilinta, and so her operation will need to be delayed.  I have her on the schedule for next Tuesday,  August 13, 2016   Annamarie Major

## 2019-08-07 NOTE — Progress Notes (Signed)
Pharmacy Antibiotic Note  Kristen Berry is a 63 y.o. female admitted on 08/06/2019 with cellulitis/osteomyelitis of L great toe.  Pharmacy has been consulted for vancomycin/Zosyn dosing. ESRD on HD MWF. Patient reports missing 3 HD sessions in last 1 week PTA. BCx negative to date.  Patient has received 1st dose of Zosyn and vancomycin 1750mg  IV load in the ER.  Plan: Zosyn to 2.25g IV q8h Vancomycin 750mg  IV qHD Monitor clinical progress, c/s, abx plan/LOT Pre-HD vancomycin level as indicated F/u HD schedule/tolerance inpatient   Weight: 167 lb 15.9 oz (76.2 kg)  Temp (24hrs), Avg:98 F (36.7 C), Min:97.7 F (36.5 C), Max:98.3 F (36.8 C)  Recent Labs  Lab 08/06/19 2058 08/07/19 0529  WBC 27.3* 27.7*  CREATININE 5.48* 5.27*  LATICACIDVEN 1.3 0.9    Estimated Creatinine Clearance: 10.9 mL/min (A) (by C-G formula based on SCr of 5.27 mg/dL (H)).    Allergies  Allergen Reactions  . Crestor [Rosuvastatin Calcium] Other (See Comments)    Leg pain    [name], PharmD, BCPS Please check AMION for all [place] contact numbers Clinical Pharmacist 08/07/2019 8:10 AM

## 2019-08-08 DIAGNOSIS — D631 Anemia in chronic kidney disease: Secondary | ICD-10-CM

## 2019-08-08 DIAGNOSIS — N189 Chronic kidney disease, unspecified: Secondary | ICD-10-CM

## 2019-08-08 LAB — HEPARIN LEVEL (UNFRACTIONATED): Heparin Unfractionated: 0.8 IU/mL — ABNORMAL HIGH (ref 0.30–0.70)

## 2019-08-08 LAB — BASIC METABOLIC PANEL
Anion gap: 19 — ABNORMAL HIGH (ref 5–15)
BUN: 30 mg/dL — ABNORMAL HIGH (ref 8–23)
CO2: 20 mmol/L — ABNORMAL LOW (ref 22–32)
Calcium: 8 mg/dL — ABNORMAL LOW (ref 8.9–10.3)
Chloride: 94 mmol/L — ABNORMAL LOW (ref 98–111)
Creatinine, Ser: 2.94 mg/dL — ABNORMAL HIGH (ref 0.44–1.00)
GFR calc Af Amer: 19 mL/min — ABNORMAL LOW (ref >60)
GFR calc non Af Amer: 16 mL/min — ABNORMAL LOW (ref >60)
Glucose, Bld: 125 mg/dL — ABNORMAL HIGH (ref 70–99)
Potassium: 3.2 mmol/L — ABNORMAL LOW (ref 3.5–5.1)
Sodium: 133 mmol/L — ABNORMAL LOW (ref 135–145)

## 2019-08-08 LAB — CBC
HCT: 33.1 % — ABNORMAL LOW (ref 36.0–46.0)
Hemoglobin: 11 g/dL — ABNORMAL LOW (ref 12.0–15.0)
MCH: 30.3 pg (ref 26.0–34.0)
MCHC: 33.2 g/dL (ref 30.0–36.0)
MCV: 91.2 fL (ref 80.0–100.0)
Platelets: 822 10*3/uL — ABNORMAL HIGH (ref 150–400)
RBC: 3.63 MIL/uL — ABNORMAL LOW (ref 3.87–5.11)
RDW: 16 % — ABNORMAL HIGH (ref 11.5–15.5)
WBC: 26.5 10*3/uL — ABNORMAL HIGH (ref 4.0–10.5)
nRBC: 0 % (ref 0.0–0.2)

## 2019-08-08 LAB — GLUCOSE, CAPILLARY
Glucose-Capillary: 108 mg/dL — ABNORMAL HIGH (ref 70–99)
Glucose-Capillary: 115 mg/dL — ABNORMAL HIGH (ref 70–99)
Glucose-Capillary: 119 mg/dL — ABNORMAL HIGH (ref 70–99)
Glucose-Capillary: 151 mg/dL — ABNORMAL HIGH (ref 70–99)
Glucose-Capillary: 169 mg/dL — ABNORMAL HIGH (ref 70–99)
Glucose-Capillary: 190 mg/dL — ABNORMAL HIGH (ref 70–99)

## 2019-08-08 LAB — APTT
aPTT: 83 seconds — ABNORMAL HIGH (ref 24–36)
aPTT: 68 seconds — ABNORMAL HIGH (ref 24–36)

## 2019-08-08 LAB — PATHOLOGIST SMEAR REVIEW

## 2019-08-08 MED ORDER — CHLORHEXIDINE GLUCONATE CLOTH 2 % EX PADS: 6.0000 | MEDICATED_PAD | Freq: Every day | CUTANEOUS | Status: DC

## 2019-08-08 MED ORDER — HEPARIN SODIUM (PORCINE) 1000 UNIT/ML IJ SOLN
INTRAMUSCULAR | Status: AC
  Filled 2019-08-08: qty 4

## 2019-08-08 MED ORDER — VANCOMYCIN HCL IN DEXTROSE 750-5 MG/150ML-% IV SOLN
INTRAVENOUS | Status: AC
  Filled 2019-08-08: qty 150

## 2019-08-08 MED ORDER — HYDROCODONE-ACETAMINOPHEN 5-325 MG PO TABS
ORAL_TABLET | ORAL | Status: AC
  Filled 2019-08-08: qty 1

## 2019-08-08 MED ORDER — HYDROCODONE-ACETAMINOPHEN 5-325 MG PO TABS
1.0000 | ORAL_TABLET | ORAL | Status: DC | PRN
  Administered 2019-08-08 – 2019-08-10 (×9): 1 via ORAL
  Filled 2019-08-08 (×7): qty 1

## 2019-08-08 NOTE — Progress Notes (Signed)
PROGRESS NOTE    Kristen Berry  HUD:[id number] DOB: 10/02/1955 DOA: 08/06/2019 PCP: [place], Sarah T, MD   Brief Narrative:  HPI On 11/10/202 by Dr. Gean Birchwood Kristen Berry is a 63 y.o. female with history of ESRD on hemodialysis on Monday Wednesday Friday, peripheral vascular disease status post stenting, history of DVT, anemia presents to the ER after patient has been noticing increasing swelling of the left foot increasing discharge and gangrene of the great toe noticed over the last 2 weeks.  Patient was admitted last month for cellulitis involving the left foot and great toe.  Patient states over the last 1 week patient missed 3 sessions of her dialysis due to transport issues.  Patient is mildly short of breath but not in distress.  Has noticed increasing swelling of the left foot with increasing discharge mostly from the left great toe which has become gangrenous of the last 2 weeks.   Interim history  Admitted with sepsis secondary to ischemia of the left foot/gangrene.  Orthopedics and vascular surgery consulted and appreciated.  Patient also ESRD patient, nephrology consulted. Assessment & Plan   Sepsis secondary to diabetic insensate neuropathy with critical limb ischemia of the left foot/great toe with cellulitis, osteomyelitis, gangrene -On admission, patient presented with leukocytosis, tachycardia, tachypnea -Orthopedic surgery consulted and appreciated.  He discussed options of possible revascularization versus trans to be amputation.  Patient declined amputation at this time. -Vascular surgery consulted and appreciated, plans for arteriogram later this week. -Currently on IV vancomycin and Zosyn  ESRD -Patient dialyzes Monday, Wednesday, Friday -Patient missed 1 week of hemodialysis due to living alone and her daughter moved out.  She had lack of reliable transportation due to wheelchair dependence. -Social work consulted -Nephrology consulted and appreciated,  planning for HD today.  Essential hypertension -Controlled, continue Coreg and hydralazine.  Amlodipine currently held -Continue volume management with hemodialysis  Anemia of chronic disease -Hemoglobin appears to be stable, continue to monitor CBC  History of DVT -Eliquis currently held due to possible procedure -Continue IV heparin  Diabetes mellitus, type II -Diet controlled, continue to monitor CBGs  Thrombocytosis -Likely reactive to sepsis, continue to monitor CBC  Hypokalemia -Continue to supplement with dialysis -Monitor BMP  History of CVA -currently on heparin, statin  Status post right BKA  DVT Prophylaxis Heparin  Code Status: Full  Family Communication: None at bedside  Disposition Plan: Admitted.  Pending further vascular recommendations.  Consultants Orthopedic surgery Nephrology Vascular surgery  Procedures  None  Antibiotics   Anti-infectives (From admission, onward)   Start     Dose/Rate Route Frequency Ordered Stop   08/08/19 1200  vancomycin (VANCOCIN) IVPB 750 mg/150 ml premix     750 mg 150 mL/hr over 60 Minutes Intravenous Every M-W-F (Hemodialysis) 08/06/19 2229     08/07/19 1930  vancomycin (VANCOCIN) IVPB 750 mg/150 ml premix     750 mg 150 mL/hr over 60 Minutes Intravenous  Once 08/07/19 1844 08/07/19 2135   08/07/19 0830  piperacillin-tazobactam (ZOSYN) IVPB 2.25 g     2.25 g 100 mL/hr over 30 Minutes Intravenous Every 8 hours 08/07/19 0812     08/07/19 0800  piperacillin-tazobactam (ZOSYN) IVPB 3.375 g  Status:  Discontinued     3.375 g 12.5 mL/hr over 240 Minutes Intravenous Every 12 hours 08/07/19 0303 08/07/19 0812   08/06/19 2230  piperacillin-tazobactam (ZOSYN) IVPB 3.375 g     3.375 g 100 mL/hr over 30 Minutes Intravenous  Once 08/06/19 2222 08/06/19  2342   08/06/19 2230  vancomycin (VANCOCIN) 1,750 mg in sodium chloride 0.9 % 500 mL IVPB     1,750 mg 250 mL/hr over 120 Minutes Intravenous  Once 08/06/19 2226 08/07/19  0429      Subjective:   Kristen Berry seen and examined today.  Patient denies any current chest pain or shortness of breath, abdominal pain, nausea or vomiting, diarrhea constipation.  She only complains of pain mainly in her left foot and toe.  Objective:   Vitals:   08/08/19 0634 08/08/19 0824 08/08/19 1043 08/08/19 1219  BP: (!) 97/37 (!) 107/44  (!) 108/49  Pulse: 74 (!) 113  (!) 109  Resp: 20 20 17 19   Temp: 97.7 F (36.5 C) 98.1 F (36.7 C)  98.3 F (36.8 C)  TempSrc: Oral Oral  Oral  SpO2: 100% 97%  98%  Weight:      Height:        Intake/Output Summary (Last 24 hours) at 08/08/2019 1512 Last data filed at 08/08/2019 6834 Gross per 24 hour  Intake 384.04 ml  Output 2030 ml  Net -1645.96 ml   Filed Weights   08/07/19 1837 08/08/19 0304  Weight: 76.2 kg 82 kg    Exam  General: Well developed, well nourished, NAD, appears stated age  HEENT: NCAT, mucous membranes moist.   Cardiovascular: S1 S2 auscultated, RRR, no murmur  Respiratory: Clear to auscultation bilaterally with equal chest rise  Abdomen: Soft, nontender, nondistended, + bowel sounds  Extremities: warm dry without cyanosis clubbing or edema of LLE. Left great toe gangrene. R BKA  Neuro: AAOx3, nonfocal  Psych: Normal affect and demeanor    Data Reviewed: I have personally reviewed following labs and imaging studies  CBC: Recent Labs  Lab 08/06/19 2058 08/07/19 0529 08/08/19 0225  WBC 27.3* 27.7* 26.5*  NEUTROABS 23.4*  --   --   HGB 12.0 10.9* 11.0*  HCT 37.0 35.0* 33.1*  MCV 94.9 95.9 91.2  PLT 965* 893* 196*   Basic Metabolic Panel: Recent Labs  Lab 08/06/19 2058 08/07/19 0529 08/08/19 0225  NA 133* 131* 133*  K 3.3* 3.2* 3.2*  CL 95* 97* 94*  CO2 10* 10* 20*  GLUCOSE 107* 75 125*  BUN 102* 101* 30*  CREATININE 5.48* 5.27* 2.94*  CALCIUM 7.5* 7.3* 8.0*  MG 2.3  --   --    GFR: Estimated Creatinine Clearance: 22.1 mL/min (A) (by C-G formula based on SCr of 2.94  mg/dL (H)). Liver Function Tests: Recent Labs  Lab 08/06/19 2235  AST 10*  ALT 7  ALKPHOS 162*  BILITOT 0.9  PROT 8.0  ALBUMIN 2.9*   No results for input(s): LIPASE, AMYLASE in the last 168 hours. No results for input(s): AMMONIA in the last 168 hours. Coagulation Profile: Recent Labs  Lab 08/07/19 0529  INR 1.6*   Cardiac Enzymes: No results for input(s): CKTOTAL, CKMB, CKMBINDEX, TROPONINI in the last 168 hours. BNP (last 3 results) No results for input(s): PROBNP in the last 8760 hours. HbA1C: No results for input(s): HGBA1C in the last 72 hours. CBG: Recent Labs  Lab 08/07/19 2002 08/08/19 0014 08/08/19 0305 08/08/19 0818 08/08/19 1216  GLUCAP 97 151* 119* 115* 190*   Lipid Profile: No results for input(s): CHOL, HDL, LDLCALC, TRIG, CHOLHDL, LDLDIRECT in the last 72 hours. Thyroid Function Tests: No results for input(s): TSH, T4TOTAL, FREET4, T3FREE, THYROIDAB in the last 72 hours. Anemia Panel: No results for input(s): VITAMINB12, FOLATE, FERRITIN, TIBC, IRON, RETICCTPCT in  the last 72 hours. Urine analysis:    Component Value Date/Time   COLORURINE YELLOW 10/17/2017 1402   APPEARANCEUR HAZY (A) 10/17/2017 1402   LABSPEC 1.013 10/17/2017 1402   PHURINE 5.0 10/17/2017 1402   GLUCOSEU NEGATIVE 10/17/2017 1402   HGBUR NEGATIVE 10/17/2017 1402   BILIRUBINUR NEGATIVE 10/17/2017 1402   KETONESUR 5 (A) 10/17/2017 1402   PROTEINUR NEGATIVE 10/17/2017 1402   NITRITE NEGATIVE 10/17/2017 1402   LEUKOCYTESUR NEGATIVE 10/17/2017 1402   Sepsis Labs: @LABRCNTIP (procalcitonin:4,lacticidven:4)  ) Recent Results (from the past 240 hour(s))  Blood culture (routine x 2)     Status: None (Preliminary result)   Collection Time: 08/06/19  8:24 PM   Specimen: BLOOD  Result Value Ref Range Status   Specimen Description BLOOD RIGHT ANTECUBITAL  Final   Special Requests   Final    BOTTLES DRAWN AEROBIC AND ANAEROBIC Blood Culture adequate volume   Culture   Final     NO GROWTH 2 DAYS Performed at [facility], [place] [address] [place], [place] [address]    Report Status PENDING  Incomplete  Blood culture (routine x 2)     Status: None (Preliminary result)   Collection Time: 08/06/19  9:44 PM   Specimen: BLOOD RIGHT HAND  Result Value Ref Range Status   Specimen Description BLOOD RIGHT HAND  Final   Special Requests   Final    BOTTLES DRAWN AEROBIC ONLY Blood Culture results may not be optimal due to an inadequate volume of blood received in culture bottles   Culture   Final    NO GROWTH 2 DAYS Performed at [facility], [place] [address], [place], [place] [address]    Report Status PENDING  Incomplete  SARS CORONAVIRUS 2 (TAT 6-24 HRS) Nasopharyngeal Nasopharyngeal Swab     Status: None   Collection Time: 08/06/19 11:05 PM   Specimen: Nasopharyngeal Swab  Result Value Ref Range Status   SARS Coronavirus 2 NEGATIVE NEGATIVE Final    Comment: (NOTE) SARS-CoV-2 target nucleic acids are NOT DETECTED. The SARS-CoV-2 RNA is generally detectable in upper and lower respiratory specimens during the acute phase of infection. Negative results do not preclude SARS-CoV-2 infection, do not rule out co-infections with other pathogens, and should not be used as the sole basis for treatment or other patient management decisions. Negative results must be combined with clinical observations, patient history, and epidemiological information. The expected result is Negative. Fact Sheet for Patients: SugarRoll.be Fact Sheet for Healthcare Providers: https://www.woods-mathews.com/ This test is not yet approved or cleared by the [place] FDA and  has been authorized for detection and/or diagnosis of SARS-CoV-2 by FDA under an Emergency Use Authorization (EUA). This EUA will remain  in effect (meaning this test can be used) for the duration of the COVID-19 declaration under Section 56 4(b)(1) of the Act, 21  U.S.C. section 360bbb-3(b)(1), unless the authorization is terminated or revoked sooner. Performed at [facility], [place] [address] [place], [place] [address]   MRSA PCR Screening     Status: Abnormal   Collection Time: 08/07/19  8:26 PM   Specimen: Nasal Mucosa; Nasopharyngeal  Result Value Ref Range Status   MRSA by PCR POSITIVE (A) NEGATIVE Final    Comment:        The GeneXpert MRSA Assay (FDA approved for NASAL specimens only), is one component of a comprehensive MRSA colonization surveillance program. It is not intended to diagnose MRSA infection nor to guide or monitor treatment  for MRSA infections. RESULT CALLED TO, READ BACK BY AND VERIFIED WITH: Dorena Bodo RN 08/07/19 2221 JDW Performed at [facility], [place] [address] [place], [place] [address]       Radiology Studies: Dg Chest 2 View  Result Date: 08/06/2019 CLINICAL DATA:  Shortness of breath.  Missed dialysis. EXAM: CHEST - 2 VIEW COMPARISON:  Feb 15, 2019 FINDINGS: There is a well-positioned tunneled dialysis catheter. There is cardiomegaly with vascular congestion and early pulmonary edema. No large pleural effusion. There is no focal infiltrate. No acute osseous abnormality. IMPRESSION: 1. Cardiomegaly with vascular congestion and possible early edema. 2. Well-positioned tunneled dialysis catheter. Electronically Signed   By: Constance Holster M.D.   On: 08/06/2019 22:18   Dg Foot Complete Left  Result Date: 08/06/2019 CLINICAL DATA:  Acute on chronic pain in left great toe. Chronic wound with positive erythema and warmth. EXAM: LEFT FOOT - COMPLETE 3+ VIEW COMPARISON:  06/17/2019 FINDINGS: Findings are consistent with osteomyelitis involving the proximal phalanx of the first digit. There is subtle lucencies involving the medial aspect of the first metatarsal head. Pockets of subcutaneous gas and soft tissue swelling is noted. There is a lucency involving the tuft of the distal phalanx of the first  digit. The patient is status post amputation of the second and third digits as before. There is a large plantar calcaneal spur. There is an Achilles tendon enthesophyte. IMPRESSION: 1. Overall findings consistent with osteomyelitis of the proximal and distal phalanges of the first digit. 2. Questionable osteomyelitis involving the head of the first metatarsal. This can be further evaluated with a contrast enhanced MRI. 3. Soft tissue infection with multiple pockets of subcutaneous gas. Electronically Signed   By: Constance Holster M.D.   On: 08/06/2019 22:16     Scheduled Meds: . atorvastatin  20 mg Oral QHS  . calcium acetate  667 mg Oral TID WC  . carvedilol  25 mg Oral BID  . Chlorhexidine Gluconate Cloth  6 each Topical Q0600  . Chlorhexidine Gluconate Cloth  6 each Topical Q0600  . hydrALAZINE  25 mg Oral BID  . mupirocin ointment  1 application Nasal BID  . pantoprazole  40 mg Oral Daily  . saccharomyces boulardii  250 mg Oral QHS   Continuous Infusions: . heparin 1,300 Units/hr (08/08/19 1337)  . piperacillin-tazobactam (ZOSYN)  IV 2.25 g (08/08/19 1335)  . vancomycin       LOS: 2 days   Time Spent in minutes   30 minutes  Mylei Brackeen D.O. on 08/08/2019 at 3:12 PM  Between 7am to 7pm - Please see pager noted on [email or web address]  After 7pm go to [email or web address]  And look for the night coverage person covering for me after hours  [facility] Office  [phone]

## 2019-08-08 NOTE — Progress Notes (Signed)
[facility] Progress Note  Subjective: feeling much better today  Vitals:   08/08/19 0634 08/08/19 0824 08/08/19 1043 08/08/19 1219  BP: (!) 97/37 (!) 107/44  (!) 108/49  Pulse: 74 (!) 113  (!) 109  Resp: 20 20 17 19   Temp: 97.7 F (36.5 C) 98.1 F (36.7 C)  98.3 F (36.8 C)  TempSrc: Oral Oral  Oral  SpO2: 100% 97%  98%  Weight:      Height:        Inpatient medications: . atorvastatin  20 mg Oral QHS  . calcium acetate  667 mg Oral TID WC  . carvedilol  25 mg Oral BID  . Chlorhexidine Gluconate Cloth  6 each Topical Q0600  . Chlorhexidine Gluconate Cloth  6 each Topical Q0600  . hydrALAZINE  25 mg Oral BID  . mupirocin ointment  1 application Nasal BID  . pantoprazole  40 mg Oral Daily  . saccharomyces boulardii  250 mg Oral QHS   . heparin 1,250 Units/hr (08/08/19 0247)  . piperacillin-tazobactam (ZOSYN)  IV 2.25 g (08/08/19 0327)  . vancomycin     acetaminophen **OR** acetaminophen, heparin, HYDROcodone-acetaminophen, hydrOXYzine    Exam: Gen alert, no distress Chest clear bilat RRR no MRG Abd soft ntnd no mass or ascites +bs obese MS R BKA Ext no pretib edema Neuro is alert, Ox 3 , nf R TDC,  LUA AVF+bruit (not using)    Home meds:  - amlodipine 5/ carvedilol 25 bid/ hydralazine 25 bid  - apixaban 2.5 bid  - atorvastatin 20 hs/ ticagrelor 90 bid  - pantoprazole 40 hs/ calc acetate ac tid  - prn's/ vitamins/ supplements     Outpt HD: MWF [facility]   4h   350/800   80kg   2/2.25 bath  TDC   Heparin 2400  - LUE AVF (poorly matured, not using)  - calc 0.25 tiw   Assessment/ Plan: 1. ESRD - MWF HD. Pt missed one week HD, living alone since dtr moved out and doesn't have reliable transportation due to [place] dependence. Consulting renal SW. HD again today.  2. Malaise - - due to uremia, better after HD yest. HD today.  3. HTN/ vol - 2 L off yest, up 2kg today, BP's normal on home coreg/ hydral 4. H/o CVA 5. Chron anticoag - on eliquis,  not sure reason 6. Anemia ckd - Hb 11 , follow        Rob Taksh Hjort 08/08/2019, 1:14 PM  Iron/TIBC/Ferritin/ %Sat    Component Value Date/Time   IRON 37 10/18/2017 0507   TIBC 209 (L) 10/18/2017 0507   FERRITIN 118 10/18/2017 0507   IRONPCTSAT 18 10/18/2017 0507   Recent Labs  Lab 08/06/19 2235 08/07/19 0529 08/08/19 0225  NA  --  131* 133*  K  --  3.2* 3.2*  CL  --  97* 94*  CO2  --  10* 20*  GLUCOSE  --  75 125*  BUN  --  101* 30*  CREATININE  --  5.27* 2.94*  CALCIUM  --  7.3* 8.0*  ALBUMIN 2.9*  --   --   INR  --  1.6*  --    Recent Labs  Lab 08/06/19 2235  AST 10*  ALT 7  ALKPHOS 162*  BILITOT 0.9  PROT 8.0   Recent Labs  Lab 08/08/19 0225  WBC 26.5*  HGB 11.0*  HCT 33.1*  PLT 822*

## 2019-08-08 NOTE — Progress Notes (Signed)
Patient laying comfortable in bed appropriate to questions, States she is to have some "mapping' of her lower extremity today  dry gangrene Left toe. No changes or surrounding erythema. Will await findings of vascular studies/intervention  To plan for definitive orthopedic procedure

## 2019-08-08 NOTE — Progress Notes (Signed)
ANTICOAGULATION CONSULT NOTE - Initial Consult  Pharmacy Consult for Heparin Indication: h/o DVT  Allergies  Allergen Reactions  . Crestor [Rosuvastatin Calcium] Other (See Comments)    Leg pain    Patient Measurements: Height: 5\' 9"  (175.3 cm) Weight: 180 lb 12.4 oz (82 kg) IBW/kg (Calculated) : 66.2   Vital Signs: Temp: 98.3 F (36.8 C) (11/11 1219) Temp Source: Oral (11/11 1219) BP: 108/49 (11/11 1219) Pulse Rate: 109 (11/11 1219)  Labs: Recent Labs    08/06/19 2058  08/07/19 0529 08/07/19 1500 08/08/19 0225 08/08/19 1125  HGB 12.0  --  10.9*  --  11.0*  --   HCT 37.0  --  35.0*  --  33.1*  --   PLT 965*  --  893*  --  822*  --   APTT  --    < > 70* 54* 83* 68*  LABPROT  --   --  18.4*  --   --   --   INR  --   --  1.6*  --   --   --   HEPARINUNFRC  --   --  1.28*  --  0.80*  --   CREATININE 5.48*  --  5.27*  --  2.94*  --    < > = values in this interval not displayed.    Estimated Creatinine Clearance: 22.1 mL/min (A) (by C-G formula based on SCr of 2.94 mg/dL (H)).   Medical History: Past Medical History:  Diagnosis Date  . Anemia   . Arthritis    "hands, knees" (10/10/2017)  . Asthma   . CHF (congestive heart failure) ([place])   . Coronary artery disease   . ESRD on hemodialysis [facility])    Dialysis T/ Th/ Sat  . H/O Clostridium difficile infection   . High cholesterol   . History of kidney stones   . Hypertension   . PVD (peripheral vascular disease) ([place])    "LLE; will have OR" (10/10/2017)  . Sleep apnea    "never given mask" (10/10/2017)  . Stroke [facility])    mild stroke mid April - 2019  . Type II diabetes mellitus ([place])    "no RX anymore" (10/10/2017)    Medications:  No current facility-administered medications on file prior to encounter.    Current Outpatient Medications on File Prior to Encounter  Medication Sig Dispense Refill  . acetaminophen (TYLENOL) 500 MG tablet Take 2,000 mg by mouth every 4 (four) hours as needed (pain).     [name]  albuterol (PROAIR HFA) 108 (90 Base) MCG/ACT inhaler Inhale 2 puffs into the lungs every 6 (six) hours as needed for wheezing or shortness of breath.     [name] amLODipine (NORVASC) 5 MG tablet Take 5 mg by mouth at bedtime.     [name] apixaban (ELIQUIS) 2.5 MG TABS tablet Take 1 tablet (2.5 mg total) by mouth 2 (two) times daily. 60 tablet   . atorvastatin (LIPITOR) 20 MG tablet Take 1 tablet (20 mg total) by mouth daily at 6 PM. (Patient taking differently: Take 20 mg by mouth at bedtime. ) 30 tablet 0  . calcium acetate (PHOSLO) 667 MG capsule Take 667 mg by mouth See admin instructions. Take one capsule (667 mg) by mouth up to three times daily with meals    . carvedilol (COREG) 25 MG tablet Take 25 mg by mouth 2 (two) times daily.    . clotrimazole-betamethasone (LOTRISONE) cream Apply 1 application topically daily as needed (rash).   0  .  diphenoxylate-atropine (LOMOTIL) 2.5-0.025 MG tablet Take 1 tablet by mouth every 12 (twelve) hours as needed for diarrhea or loose stools.     . hydrALAZINE (APRESOLINE) 25 MG tablet Take 25 mg by mouth 2 (two) times daily.     . Melatonin 5 MG CAPS Take 5 mg by mouth at bedtime.    . pantoprazole (PROTONIX) 40 MG tablet Take 40 mg by mouth at bedtime.     . saccharomyces boulardii (FLORASTOR) 250 MG capsule Take 1 capsule (250 mg total) by mouth 2 (two) times daily. (Patient taking differently: Take 250 mg by mouth at bedtime. ) 20 capsule 0  . ticagrelor (BRILINTA) 90 MG TABS tablet Take 90 mg by mouth 2 (two) times daily.     [name] HYDROcodone-acetaminophen (NORCO) 5-325 MG tablet Take 1 tablet by mouth every 6 (six) hours as needed for moderate pain. (Patient not taking: Reported on 08/06/2019) 12 tablet 0  . pentafluoroprop-tetrafluoroeth (GEBAUERS) AERO Apply 1 application topically as needed (topical anesthesia for hemodialysis). (Patient not taking: Reported on 08/06/2019)  0  . polyethylene glycol (MIRALAX / GLYCOLAX) packet Take 17 g by mouth daily as needed for  mild constipation. (Patient not taking: Reported on 08/06/2019) 14 each 0     Assessment: 63 y.o. female with h/o DVT, Eliquis on hold for possible amputation d/t cellulitis/osteomyelitis.  CBC stable, no reports of bleeding and ortho/vascular planning vascular studies and plan for procedure.  APTT remains therapeutic at 68 seconds (lower end) on 1250 units/hr.    Goal of Therapy: APTT 66-102 sec  Heparin level 0.3-0.7 units/ml Monitor platelets by anticoagulation protocol: Yes   Plan:  Increase heparin gtt to 1300 units/hr Heparin level/aPTT in AM F/u vascular and ortho plan  Bertis Ruddy, PharmD Clinical Pharmacist Please check AMION for all [place] numbers 08/08/2019 12:45 PM

## 2019-08-08 NOTE — Evaluation (Signed)
Physical Therapy Evaluation Patient Details Name: Kristen Berry MRN: [id number] DOB: 03-27-1956 Today's Date: 08/08/2019   History of Present Illness  63 year old female, living alone, with PMH of diet-controlled DM 2, HTN, HLD, ESRD on MWF HD, OSA not on CPAP, PAD s/p right BKA, CAD, chronic diastolic CHF, anemia ,CVA, DVT, hospitalized 9/28-10/1 for cellulitis of left foot, missed a week of HD due to lack of transport to HD, presented to [facility] ED on 08/06/2019 due to generally feeling unwell, increased swelling of left foot, blackish discoloration of left great toe with drainage ongoing for last 2 weeks.    Clinical Impression  Pt admitted with above. Prior to admission, pt lives alone, performs stand pivot transfers, and mobilizes with a wheelchair. She has a prosthetic that she does not use. On PT evaluation pt performing bed mobility at a min guard assist level. Declining transfer out of bed; however, per RN, requiring maximal assist for transfer with staff. Since pt lives alone, she would need to be at a modI level with transfers prior to discharge, otherwise will need SNF. Additionally, there have been some issues with her ramp which has been inaccessible for [facility] to pick her up from home for dialysis. Will reassess post potential upcoming orthopedic procedure.     Follow Up Recommendations SNF    Equipment Recommendations  Other (comment)(TBA)    Recommendations for Other Services OT consult     Precautions / Restrictions Precautions Precautions: Fall;Other (comment) Precaution Comments: R BKA Restrictions Weight Bearing Restrictions: No      Mobility  Bed Mobility Overal bed mobility: Needs Assistance Bed Mobility: Supine to Sit;Sit to Supine     Supine to sit: Min guard Sit to supine: Min guard   General bed mobility comments: Increased time/effort, no physical assist required  Transfers                 General transfer comment: deferred by  pt  Ambulation/Gait                Stairs            Wheelchair Mobility    Modified Rankin (Stroke Patients Only)       Balance Overall balance assessment: Needs assistance Sitting-balance support: Feet supported Sitting balance-Leahy Scale: Fair                                       Pertinent Vitals/Pain Pain Assessment: Faces Faces Pain Scale: Hurts little more Pain Location: left toe Pain Descriptors / Indicators: Grimacing Pain Intervention(s): Monitored during session    Home Living Family/patient expects to be discharged to:: Private residence Living Arrangements: Alone Available Help at Discharge: Family;Available PRN/intermittently Type of Home: Mobile home Home Access: Ramped entrance     Home Layout: One level Home Equipment: [place] - 4 wheels;Bedside commode;Tub bench;Wheelchair - manual Additional Comments: Also has a lift chair that she would rather not have to use the lift function; she sleeps in her lift chair    Prior Function Level of Independence: Needs assistance   Gait / Transfers Assistance Needed: stand pivot transfers from lift chair <> w/c and w/c <> BSC  ADL's / Homemaking Assistance Needed: bird baths, pt son helps with IADL's        Hand Dominance        Extremity/Trunk Assessment   Upper Extremity Assessment Upper Extremity Assessment: RUE deficits/detail;LUE deficits/detail RUE  Deficits / Details: Grossly 4/5 LUE Deficits / Details: Grossly 4/5    Lower Extremity Assessment Lower Extremity Assessment: RLE deficits/detail;LLE deficits/detail RLE Deficits / Details: BKA; ROM WFL LLE Deficits / Details: Strength 5/5; necrotic 1st toe       Communication   Communication: HOH  Cognition Arousal/Alertness: Awake/alert Behavior During Therapy: WFL for tasks assessed/performed Overall Cognitive Status: Within Functional Limits for tasks assessed                                         General Comments      Exercises     Assessment/Plan    PT Assessment Patient needs continued PT services  PT Problem List Decreased strength;Decreased range of motion;Decreased balance;Decreased mobility;Pain       PT Treatment Interventions DME instruction;Functional mobility training;Therapeutic activities;Therapeutic exercise;Balance training;Patient/family education;Wheelchair mobility training    PT Goals (Current goals can be found in the Care Plan section)  Acute Rehab PT Goals Patient Stated Goal: "have revascularization surgery." PT Goal Formulation: With patient Time For Goal Achievement: 08/22/19 Potential to Achieve Goals: Fair    Frequency Min 3X/week   Barriers to discharge        Co-evaluation               AM-PAC PT "6 Clicks" Mobility  Outcome Measure Help needed turning from your back to your side while in a flat bed without using bedrails?: None Help needed moving from lying on your back to sitting on the side of a flat bed without using bedrails?: A Little Help needed moving to and from a bed to a chair (including a wheelchair)?: A Lot Help needed standing up from a chair using your arms (e.g., wheelchair or bedside chair)?: A Lot Help needed to walk in hospital room?: A Lot Help needed climbing 3-5 steps with a railing? : Total 6 Click Score: 14    End of Session   Activity Tolerance: Patient tolerated treatment well Patient left: in bed;with call bell/phone within reach;with nursing/sitter in room Nurse Communication: Mobility status PT Visit Diagnosis: Other abnormalities of gait and mobility (R26.89);Pain Pain - Right/Left: Left Pain - part of body: Ankle and joints of foot    Time: 1141-1154 PT Time Calculation (min) (ACUTE ONLY): 13 min   Charges:   PT Evaluation $PT Eval Moderate Complexity: 1 Mod          Ellamae Sia, [place], DPT Acute Rehabilitation Services Pager [phone] Office [phone]   Willy Eddy 08/08/2019, 1:49 PM

## 2019-08-08 NOTE — Progress Notes (Signed)
Pt returned back to 5w34 from dialysis. Pt placed back on telemetry monitor. Will continue to monitor pt. Ranelle Oyster, RN

## 2019-08-08 NOTE — Progress Notes (Signed)
[place] for Heparin Indication: h/o DVT  Allergies  Allergen Reactions  . Crestor [Rosuvastatin Calcium] Other (See Comments)    Leg pain    Patient Measurements: Height: 5\' 9"  (175.3 cm) Weight: 180 lb 12.4 oz (82 kg) IBW/kg (Calculated) : 66.2   Vital Signs: Temp: 98 F (36.7 C) (11/11 0304) Temp Source: Oral (11/11 0304) BP: 101/43 (11/11 0304) Pulse Rate: 75 (11/11 0304)  Labs: Recent Labs    08/06/19 2058 08/07/19 0529 08/07/19 1500 08/08/19 0225  HGB 12.0 10.9*  --  11.0*  HCT 37.0 35.0*  --  33.1*  PLT 965* 893*  --  822*  APTT  --  70* 54* 83*  LABPROT  --  18.4*  --   --   INR  --  1.6*  --   --   HEPARINUNFRC  --  1.28*  --  0.80*  CREATININE 5.48* 5.27*  --  2.94*    Estimated Creatinine Clearance: 22.1 mL/min (A) (by C-G formula based on SCr of 2.94 mg/dL (H)).   Medical History: Past Medical History:  Diagnosis Date  . Anemia   . Arthritis    "hands, knees" (10/10/2017)  . Asthma   . CHF (congestive heart failure) ([place])   . Coronary artery disease   . ESRD on hemodialysis [facility])    Dialysis T/ Th/ Sat  . H/O Clostridium difficile infection   . High cholesterol   . History of kidney stones   . Hypertension   . PVD (peripheral vascular disease) ([place])    "LLE; will have OR" (10/10/2017)  . Sleep apnea    "never given mask" (10/10/2017)  . Stroke [facility])    mild stroke mid April - 2019  . Type II diabetes mellitus ([place])    "no RX anymore" (10/10/2017)    Medications:  No current facility-administered medications on file prior to encounter.    Current Outpatient Medications on File Prior to Encounter  Medication Sig Dispense Refill  . acetaminophen (TYLENOL) 500 MG tablet Take 2,000 mg by mouth every 4 (four) hours as needed (pain).     [name] albuterol (PROAIR HFA) 108 (90 Base) MCG/ACT inhaler Inhale 2 puffs into the lungs every 6 (six) hours as needed for wheezing or shortness of breath.     [name]  amLODipine (NORVASC) 5 MG tablet Take 5 mg by mouth at bedtime.     [name] apixaban (ELIQUIS) 2.5 MG TABS tablet Take 1 tablet (2.5 mg total) by mouth 2 (two) times daily. 60 tablet   . atorvastatin (LIPITOR) 20 MG tablet Take 1 tablet (20 mg total) by mouth daily at 6 PM. (Patient taking differently: Take 20 mg by mouth at bedtime. ) 30 tablet 0  . calcium acetate (PHOSLO) 667 MG capsule Take 667 mg by mouth See admin instructions. Take one capsule (667 mg) by mouth up to three times daily with meals    . carvedilol (COREG) 25 MG tablet Take 25 mg by mouth 2 (two) times daily.    . clotrimazole-betamethasone (LOTRISONE) cream Apply 1 application topically daily as needed (rash).   0  . diphenoxylate-atropine (LOMOTIL) 2.5-0.025 MG tablet Take 1 tablet by mouth every 12 (twelve) hours as needed for diarrhea or loose stools.     . hydrALAZINE (APRESOLINE) 25 MG tablet Take 25 mg by mouth 2 (two) times daily.     . Melatonin 5 MG CAPS Take 5 mg by mouth at bedtime.    . pantoprazole (  PROTONIX) 40 MG tablet Take 40 mg by mouth at bedtime.     . saccharomyces boulardii (FLORASTOR) 250 MG capsule Take 1 capsule (250 mg total) by mouth 2 (two) times daily. (Patient taking differently: Take 250 mg by mouth at bedtime. ) 20 capsule 0  . ticagrelor (BRILINTA) 90 MG TABS tablet Take 90 mg by mouth 2 (two) times daily.     [name] HYDROcodone-acetaminophen (NORCO) 5-325 MG tablet Take 1 tablet by mouth every 6 (six) hours as needed for moderate pain. (Patient not taking: Reported on 08/06/2019) 12 tablet 0  . pentafluoroprop-tetrafluoroeth (GEBAUERS) AERO Apply 1 application topically as needed (topical anesthesia for hemodialysis). (Patient not taking: Reported on 08/06/2019)  0  . polyethylene glycol (MIRALAX / GLYCOLAX) packet Take 17 g by mouth daily as needed for mild constipation. (Patient not taking: Reported on 08/06/2019) 14 each 0     Assessment: 63 y.o. female with h/o DVT, Eliquis on hold, for heparin  11/11  AM update: APTT therapeutic x 1 after rate increase Using aPTT to doe given apixaban influence on heparin levels  Goal of Therapy: APTT 66-102 sec  Heparin level 0.3-0.7 units/ml Monitor platelets by anticoagulation protocol: Yes   Plan:  Cont heparin at 1250 units/hr Confirmatory aPTT at [place], PharmD, [place] Pharmacist Phone: [phone]

## 2019-08-09 ENCOUNTER — Inpatient Hospital Stay (HOSPITAL_COMMUNITY): Payer: Medicare HMO

## 2019-08-09 DIAGNOSIS — Z01810 Encounter for preprocedural cardiovascular examination: Secondary | ICD-10-CM

## 2019-08-09 LAB — GLUCOSE, CAPILLARY
Glucose-Capillary: 109 mg/dL — ABNORMAL HIGH (ref 70–99)
Glucose-Capillary: 130 mg/dL — ABNORMAL HIGH (ref 70–99)
Glucose-Capillary: 133 mg/dL — ABNORMAL HIGH (ref 70–99)
Glucose-Capillary: 136 mg/dL — ABNORMAL HIGH (ref 70–99)
Glucose-Capillary: 149 mg/dL — ABNORMAL HIGH (ref 70–99)
Glucose-Capillary: 94 mg/dL (ref 70–99)

## 2019-08-09 LAB — CBC
HCT: 32.8 % — ABNORMAL LOW (ref 36.0–46.0)
Hemoglobin: 10.6 g/dL — ABNORMAL LOW (ref 12.0–15.0)
MCH: 30.3 pg (ref 26.0–34.0)
MCHC: 32.3 g/dL (ref 30.0–36.0)
MCV: 93.7 fL (ref 80.0–100.0)
Platelets: 793 10*3/uL — ABNORMAL HIGH (ref 150–400)
RBC: 3.5 MIL/uL — ABNORMAL LOW (ref 3.87–5.11)
RDW: 16.2 % — ABNORMAL HIGH (ref 11.5–15.5)
WBC: 29.1 10*3/uL — ABNORMAL HIGH (ref 4.0–10.5)
nRBC: 0 % (ref 0.0–0.2)

## 2019-08-09 LAB — RENAL FUNCTION PANEL
Albumin: 2.5 g/dL — ABNORMAL LOW (ref 3.5–5.0)
Anion gap: 17 — ABNORMAL HIGH (ref 5–15)
BUN: 17 mg/dL (ref 8–23)
CO2: 22 mmol/L (ref 22–32)
Calcium: 8.1 mg/dL — ABNORMAL LOW (ref 8.9–10.3)
Chloride: 95 mmol/L — ABNORMAL LOW (ref 98–111)
Creatinine, Ser: 2.68 mg/dL — ABNORMAL HIGH (ref 0.44–1.00)
GFR calc Af Amer: 21 mL/min — ABNORMAL LOW (ref >60)
GFR calc non Af Amer: 18 mL/min — ABNORMAL LOW (ref >60)
Glucose, Bld: 130 mg/dL — ABNORMAL HIGH (ref 70–99)
Phosphorus: 2.8 mg/dL (ref 2.5–4.6)
Potassium: 3.7 mmol/L (ref 3.5–5.1)
Sodium: 134 mmol/L — ABNORMAL LOW (ref 135–145)

## 2019-08-09 LAB — APTT: aPTT: 71 seconds — ABNORMAL HIGH (ref 24–36)

## 2019-08-09 LAB — HEPARIN LEVEL (UNFRACTIONATED): Heparin Unfractionated: 0.34 IU/mL (ref 0.30–0.70)

## 2019-08-09 MED ORDER — CHLORHEXIDINE GLUCONATE CLOTH 2 % EX PADS
6.0000 | MEDICATED_PAD | Freq: Every day | CUTANEOUS | Status: DC
  Administered 2019-08-10 – 2019-08-18 (×8): 6 via TOPICAL

## 2019-08-09 MED ORDER — CARVEDILOL 12.5 MG PO TABS
12.5000 mg | ORAL_TABLET | Freq: Two times a day (BID) | ORAL | Status: DC
  Administered 2019-08-09 – 2019-08-15 (×8): 12.5 mg via ORAL
  Filled 2019-08-09 (×9): qty 1

## 2019-08-09 NOTE — Progress Notes (Signed)
[facility] Progress Note  Subjective: feeling good, no c/o  Vitals:   08/09/19 0025 08/09/19 0426 08/09/19 0800 08/09/19 1200  BP: (!) 91/52 (!) 125/54 97/66 120/87  Pulse: (!) 113 (!) 111 (!) 110 (!) 110  Resp:   12 14  Temp: 98.3 F (36.8 C) 98.3 F (36.8 C) 98.3 F (36.8 C) 98.1 F (36.7 C)  TempSrc: Oral Oral Oral Oral  SpO2: 97% 97% 97% 97%  Weight:      Height:        Inpatient medications: . atorvastatin  20 mg Oral QHS  . calcium acetate  667 mg Oral TID WC  . carvedilol  25 mg Oral BID  . Chlorhexidine Gluconate Cloth  6 each Topical Q0600  . Chlorhexidine Gluconate Cloth  6 each Topical Q0600  . hydrALAZINE  25 mg Oral BID  . mupirocin ointment  1 application Nasal BID  . pantoprazole  40 mg Oral Daily  . saccharomyces boulardii  250 mg Oral QHS   . heparin 1,300 Units/hr (08/08/19 2228)  . piperacillin-tazobactam (ZOSYN)  IV 2.25 g (08/09/19 0818)  . vancomycin 750 mg (08/08/19 2013)   acetaminophen **OR** acetaminophen, heparin, HYDROcodone-acetaminophen, hydrOXYzine    Exam: Gen alert, no distress Chest clear bilat RRR no MRG Abd soft ntnd no mass or ascites +bs obese MS R BKA Ext no pretib edema Neuro is alert, Ox 3 , nf R TDC,  LUA AVF+bruit (not using)    Home meds:  - amlodipine 5/ carvedilol 25 bid/ hydralazine 25 bid  - apixaban 2.5 bid  - atorvastatin 20 hs/ ticagrelor 90 bid  - pantoprazole 40 hs/ calc acetate ac tid  - prn's/ vitamins/ supplements     Outpt HD: MWF [facility]   4h   350/800   80kg   2/2.25 bath  TDC   Heparin 2400  - LUE AVF (poorly matured, not using)  - calc 0.25 tiw   Assessment/ Plan: 1. Gangrene - of foot. For revasc procedure/ surgery per [facility] on 11/17.  2. ESRD - MWF HD. Pt missed one week HD, living alone since dtr moved out and doesn't have reliable transportation due to [place] dependence. Consulting renal SW. HD again today. Back on track here. HD tomorrow.  3. Malaise - - due to uremia,  resolved.  4. HTN/ vol - up 2kg, BP's soft, will lower BP meds 5. H/o CVA 6. Chron anticoag - on eliquis, not sure reason 7. Anemia ckd - Hb 11 , follow        Kristen Berry 08/09/2019, 3:06 PM  Iron/TIBC/Ferritin/ %Sat    Component Value Date/Time   IRON 37 10/18/2017 0507   TIBC 209 (L) 10/18/2017 0507   FERRITIN 118 10/18/2017 0507   IRONPCTSAT 18 10/18/2017 0507   Recent Labs  Lab 08/07/19 0529  08/09/19 0231  NA 131*   < > 134*  K 3.2*   < > 3.7  CL 97*   < > 95*  CO2 10*   < > 22  GLUCOSE 75   < > 130*  BUN 101*   < > 17  CREATININE 5.27*   < > 2.68*  CALCIUM 7.3*   < > 8.1*  PHOS  --   --  2.8  ALBUMIN  --   --  2.5*  INR 1.6*  --   --    < > = values in this interval not displayed.   Recent Labs  Lab 08/06/19 2235  AST 10*  ALT 7  ALKPHOS 162*  BILITOT 0.9  PROT 8.0   Recent Labs  Lab 08/09/19 0231  WBC 29.1*  HGB 10.6*  HCT 32.8*  PLT 793*

## 2019-08-09 NOTE — Progress Notes (Signed)
PROGRESS NOTE    Kristen Berry  OMV:[id number] DOB: Aug 29, 1956 DOA: 08/06/2019 PCP: [place], Sarah T, MD   Brief Narrative:  HPI On 11/10/202 by Dr. Gean Birchwood Kristen Berry is a 63 y.o. female with history of ESRD on hemodialysis on Monday Wednesday Friday, peripheral vascular disease status post stenting, history of DVT, anemia presents to the ER after patient has been noticing increasing swelling of the left foot increasing discharge and gangrene of the great toe noticed over the last 2 weeks.  Patient was admitted last month for cellulitis involving the left foot and great toe.  Patient states over the last 1 week patient missed 3 sessions of her dialysis due to transport issues.  Patient is mildly short of breath but not in distress.  Has noticed increasing swelling of the left foot with increasing discharge mostly from the left great toe which has become gangrenous of the last 2 weeks.   Interim history  Admitted with sepsis secondary to ischemia of the left foot/gangrene.  Orthopedics and vascular surgery consulted and appreciated.  Patient also ESRD patient, nephrology consulted. Assessment & Plan   Sepsis secondary to diabetic insensate neuropathy with critical limb ischemia of the left foot/great toe with cellulitis, osteomyelitis, gangrene -On admission, patient presented with leukocytosis, tachycardia, tachypnea -Orthopedic surgery consulted and appreciated.  He discussed options of possible revascularization versus trans to be amputation.  Patient declined amputation at this time. -Vascular surgery consulted and appreciated, plans for left femoral to BK popliteal artery bypass on 08/14/2019. -Currently on IV vancomycin and Zosyn  ESRD -Patient dialyzes Monday, Wednesday, Friday -Patient missed 1 week of hemodialysis due to living alone and her daughter moved out.  She had lack of reliable transportation due to wheelchair dependence. -Social work consulted -Nephrology  consulted and appreciated  Essential hypertension -Controlled, continue Coreg and hydralazine.  Amlodipine currently held -Continue volume management with hemodialysis  Anemia of chronic disease -Hemoglobin appears to be stable, continue to monitor CBC  History of DVT -Eliquis currently held due to possible procedure -Continue IV heparin  Diabetes mellitus, type II -Diet controlled, continue to monitor CBGs  Thrombocytosis -Likely reactive to sepsis, continue to monitor CBC -Platelet number trending downward, currently 91 -Oncology consulted and appreciated, left message and waiting for callback  Hypokalemia -Resolved, continue to monitor BMP  History of CVA -currently on heparin, statin  Status post right BKA  DVT Prophylaxis Heparin  Code Status: Full  Family Communication: None at bedside  Disposition Plan: Admitted.  Pending surgery on 08/14/2019  Consultants Orthopedic surgery Nephrology Vascular surgery  Procedures  None  Antibiotics   Anti-infectives (From admission, onward)   Start     Dose/Rate Route Frequency Ordered Stop   08/08/19 1914  Vancomycin (VANCOCIN) 750-5 MG/150ML-% IVPB    Note to Pharmacy: Cherylann Banas   : cabinet override      08/08/19 1914 08/08/19 2111   08/08/19 1200  vancomycin (VANCOCIN) IVPB 750 mg/150 ml premix     750 mg 150 mL/hr over 60 Minutes Intravenous Every M-W-F (Hemodialysis) 08/06/19 2229     08/07/19 1930  vancomycin (VANCOCIN) IVPB 750 mg/150 ml premix     750 mg 150 mL/hr over 60 Minutes Intravenous  Once 08/07/19 1844 08/07/19 2135   08/07/19 0830  piperacillin-tazobactam (ZOSYN) IVPB 2.25 g     2.25 g 100 mL/hr over 30 Minutes Intravenous Every 8 hours 08/07/19 0812     08/07/19 0800  piperacillin-tazobactam (ZOSYN) IVPB 3.375 g  Status:  Discontinued     3.375 g 12.5 mL/hr over 240 Minutes Intravenous Every 12 hours 08/07/19 0303 08/07/19 0812   08/06/19 2230  piperacillin-tazobactam (ZOSYN) IVPB 3.375 g      3.375 g 100 mL/hr over 30 Minutes Intravenous  Once 08/06/19 2222 08/06/19 2342   08/06/19 2230  vancomycin (VANCOCIN) 1,750 mg in sodium chloride 0.9 % 500 mL IVPB     1,750 mg 250 mL/hr over 120 Minutes Intravenous  Once 08/06/19 2226 08/07/19 0429      Subjective:   [place] seen and examined today.  Patient with complaints of left foot pain.  Denies current chest pain, shortness of breath, abdominal pain, nausea vomiting, diarrhea constipation.  Objective:   Vitals:   08/08/19 2151 08/08/19 2300 08/09/19 0025 08/09/19 0426  BP: (!) 140/56  (!) 91/52 (!) 125/54  Pulse: (!) 121  (!) 113 (!) 111  Resp:  16    Temp: 97.7 F (36.5 C)  98.3 F (36.8 C) 98.3 F (36.8 C)  TempSrc: Oral  Oral Oral  SpO2: 99%  97% 97%  Weight:      Height:        Intake/Output Summary (Last 24 hours) at 08/09/2019 1019 Last data filed at 08/09/2019 0659 Gross per 24 hour  Intake 492.59 ml  Output 1600 ml  Net -1107.41 ml   Filed Weights   08/07/19 1837 08/08/19 0304 08/08/19 1810  Weight: 76.2 kg 82 kg 82 kg   Exam  General: Well developed, well nourished, NAD, appears stated age  HEENT: NCAT, mucous membranes moist.   Cardiovascular: S1 S2 auscultated, RRR, no murmur  Respiratory: Clear to auscultation bilaterally   Abdomen: Soft, nontender, nondistended, + bowel sounds  Extremities: warm dry without cyanosis clubbing or edema of LLE.  Left great toe with gangrene.  Right BKA  Neuro: AAOx3, nonfocal  Psych: Appropriate mood and affect  Data Reviewed: I have personally reviewed following labs and imaging studies  CBC: Recent Labs  Lab 08/06/19 2058 08/07/19 0529 08/08/19 0225 08/09/19 0231  WBC 27.3* 27.7* 26.5* 29.1*  NEUTROABS 23.4*  --   --   --   HGB 12.0 10.9* 11.0* 10.6*  HCT 37.0 35.0* 33.1* 32.8*  MCV 94.9 95.9 91.2 93.7  PLT 965* 893* 822* 073*   Basic Metabolic Panel: Recent Labs  Lab 08/06/19 2058 08/07/19 0529 08/08/19 0225 08/09/19 0231  NA  133* 131* 133* 134*  K 3.3* 3.2* 3.2* 3.7  CL 95* 97* 94* 95*  CO2 10* 10* 20* 22  GLUCOSE 107* 75 125* 130*  BUN 102* 101* 30* 17  CREATININE 5.48* 5.27* 2.94* 2.68*  CALCIUM 7.5* 7.3* 8.0* 8.1*  MG 2.3  --   --   --   PHOS  --   --   --  2.8   GFR: Estimated Creatinine Clearance: 24.3 mL/min (A) (by C-G formula based on SCr of 2.68 mg/dL (H)). Liver Function Tests: Recent Labs  Lab 08/06/19 2235 08/09/19 0231  AST 10*  --   ALT 7  --   ALKPHOS 162*  --   BILITOT 0.9  --   PROT 8.0  --   ALBUMIN 2.9* 2.5*   No results for input(s): LIPASE, AMYLASE in the last 168 hours. No results for input(s): AMMONIA in the last 168 hours. Coagulation Profile: Recent Labs  Lab 08/07/19 0529  INR 1.6*   Cardiac Enzymes: No results for input(s): CKTOTAL, CKMB, CKMBINDEX, TROPONINI in the last 168 hours. BNP (last 3  results) No results for input(s): PROBNP in the last 8760 hours. HbA1C: No results for input(s): HGBA1C in the last 72 hours. CBG: Recent Labs  Lab 08/08/19 1711 08/08/19 2210 08/09/19 0026 08/09/19 0422 08/09/19 0830  GLUCAP 169* 108* 94 136* 133*   Lipid Profile: No results for input(s): CHOL, HDL, LDLCALC, TRIG, CHOLHDL, LDLDIRECT in the last 72 hours. Thyroid Function Tests: No results for input(s): TSH, T4TOTAL, FREET4, T3FREE, THYROIDAB in the last 72 hours. Anemia Panel: No results for input(s): VITAMINB12, FOLATE, FERRITIN, TIBC, IRON, RETICCTPCT in the last 72 hours. Urine analysis:    Component Value Date/Time   COLORURINE YELLOW 10/17/2017 1402   APPEARANCEUR HAZY (A) 10/17/2017 1402   LABSPEC 1.013 10/17/2017 1402   PHURINE 5.0 10/17/2017 1402   GLUCOSEU NEGATIVE 10/17/2017 1402   HGBUR NEGATIVE 10/17/2017 1402   BILIRUBINUR NEGATIVE 10/17/2017 1402   KETONESUR 5 (A) 10/17/2017 1402   PROTEINUR NEGATIVE 10/17/2017 1402   NITRITE NEGATIVE 10/17/2017 1402   LEUKOCYTESUR NEGATIVE 10/17/2017 1402   Sepsis  Labs: @LABRCNTIP (procalcitonin:4,lacticidven:4)  ) Recent Results (from the past 240 hour(s))  Blood culture (routine x 2)     Status: None (Preliminary result)   Collection Time: 08/06/19  8:24 PM   Specimen: BLOOD  Result Value Ref Range Status   Specimen Description BLOOD RIGHT ANTECUBITAL  Final   Special Requests   Final    BOTTLES DRAWN AEROBIC AND ANAEROBIC Blood Culture adequate volume   Culture   Final    NO GROWTH 3 DAYS Performed at [facility], [place] [address], [place], [place] [address]    Report Status PENDING  Incomplete  Blood culture (routine x 2)     Status: None (Preliminary result)   Collection Time: 08/06/19  9:44 PM   Specimen: BLOOD RIGHT HAND  Result Value Ref Range Status   Specimen Description BLOOD RIGHT HAND  Final   Special Requests   Final    BOTTLES DRAWN AEROBIC ONLY Blood Culture results may not be optimal due to an inadequate volume of blood received in culture bottles   Culture   Final    NO GROWTH 3 DAYS Performed at [facility], [place] [address], [place], [place] [address]    Report Status PENDING  Incomplete  SARS CORONAVIRUS 2 (TAT 6-24 HRS) Nasopharyngeal Nasopharyngeal Swab     Status: None   Collection Time: 08/06/19 11:05 PM   Specimen: Nasopharyngeal Swab  Result Value Ref Range Status   SARS Coronavirus 2 NEGATIVE NEGATIVE Final    Comment: (NOTE) SARS-CoV-2 target nucleic acids are NOT DETECTED. The SARS-CoV-2 RNA is generally detectable in upper and lower respiratory specimens during the acute phase of infection. Negative results do not preclude SARS-CoV-2 infection, do not rule out co-infections with other pathogens, and should not be used as the sole basis for treatment or other patient management decisions. Negative results must be combined with clinical observations, patient history, and epidemiological information. The expected result is Negative. Fact Sheet for  Patients: SugarRoll.be Fact Sheet for Healthcare Providers: https://www.woods-mathews.com/ This test is not yet approved or cleared by the [place] FDA and  has been authorized for detection and/or diagnosis of SARS-CoV-2 by FDA under an Emergency Use Authorization (EUA). This EUA will remain  in effect (meaning this test can be used) for the duration of the COVID-19 declaration under Section 56 4(b)(1) of the Act, 21 U.S.C. section 360bbb-3(b)(1), unless the authorization is terminated or revoked sooner. Performed at [facility]  Lab, [address], [place], [place] [address]   MRSA PCR Screening     Status: Abnormal   Collection Time: 08/07/19  8:26 PM   Specimen: Nasal Mucosa; Nasopharyngeal  Result Value Ref Range Status   MRSA by PCR POSITIVE (A) NEGATIVE Final    Comment:        The GeneXpert MRSA Assay (FDA approved for NASAL specimens only), is one component of a comprehensive MRSA colonization surveillance program. It is not intended to diagnose MRSA infection nor to guide or monitor treatment for MRSA infections. RESULT CALLED TO, READ BACK BY AND VERIFIED WITH: Dorena Bodo RN 08/07/19 2221 JDW Performed at [facility], [place] [address], [place], [place] [address]       Radiology Studies: No results found.   Scheduled Meds: . atorvastatin  20 mg Oral QHS  . calcium acetate  667 mg Oral TID WC  . carvedilol  25 mg Oral BID  . Chlorhexidine Gluconate Cloth  6 each Topical Q0600  . Chlorhexidine Gluconate Cloth  6 each Topical Q0600  . hydrALAZINE  25 mg Oral BID  . mupirocin ointment  1 application Nasal BID  . pantoprazole  40 mg Oral Daily  . saccharomyces boulardii  250 mg Oral QHS   Continuous Infusions: . heparin 1,300 Units/hr (08/08/19 2228)  . piperacillin-tazobactam (ZOSYN)  IV 2.25 g (08/09/19 0818)  . vancomycin 750 mg (08/08/19 2013)     LOS: 3 days   Time Spent in minutes   30  minutes  Huxton Glaus D.O. on 08/09/2019 at 10:19 AM  Between 7am to 7pm - Please see pager noted on [email or web address]  After 7pm go to [email or web address]  And look for the night coverage person covering for me after hours  [facility] Office  [phone]

## 2019-08-09 NOTE — Progress Notes (Signed)
ANTICOAGULATION CONSULT NOTE - Initial Consult  Pharmacy Consult for Heparin Indication: h/o DVT  Allergies  Allergen Reactions  . Crestor [Rosuvastatin Calcium] Other (See Comments)    Leg pain    Patient Measurements: Height: 5\' 9"  (175.3 cm) Weight: 180 lb 12.4 oz (82 kg) IBW/kg (Calculated) : 66.2   Vital Signs: Temp: 98.3 F (36.8 C) (11/12 0025) Temp Source: Oral (11/12 0025) BP: 91/52 (11/12 0025) Pulse Rate: 113 (11/12 0025)  Labs: Recent Labs    08/07/19 0529 08/07/19 1500 08/08/19 0225 08/08/19 1125 08/09/19 0231  HGB 10.9*  --  11.0*  --  10.6*  HCT 35.0*  --  33.1*  --  32.8*  PLT 893*  --  822*  --  793*  APTT 70* 54* 83* 68*  --   LABPROT 18.4*  --   --   --   --   INR 1.6*  --   --   --   --   HEPARINUNFRC 1.28*  --  0.80*  --  0.34  CREATININE 5.27*  --  2.94*  --  2.68*    Estimated Creatinine Clearance: 24.3 mL/min (A) (by C-G formula based on SCr of 2.68 mg/dL (H)).   Medical History: Past Medical History:  Diagnosis Date  . Anemia   . Arthritis    "hands, knees" (10/10/2017)  . Asthma   . CHF (congestive heart failure) ([place])   . Coronary artery disease   . ESRD on hemodialysis [facility])    Dialysis T/ Th/ Sat  . H/O Clostridium difficile infection   . High cholesterol   . History of kidney stones   . Hypertension   . PVD (peripheral vascular disease) ([place])    "LLE; will have OR" (10/10/2017)  . Sleep apnea    "never given mask" (10/10/2017)  . Stroke [facility])    mild stroke mid April - 2019  . Type II diabetes mellitus ([place])    "no RX anymore" (10/10/2017)    Medications:  No current facility-administered medications on file prior to encounter.    Current Outpatient Medications on File Prior to Encounter  Medication Sig Dispense Refill  . acetaminophen (TYLENOL) 500 MG tablet Take 2,000 mg by mouth every 4 (four) hours as needed (pain).     [name] albuterol (PROAIR HFA) 108 (90 Base) MCG/ACT inhaler Inhale 2 puffs into the lungs every 6  (six) hours as needed for wheezing or shortness of breath.     [name] amLODipine (NORVASC) 5 MG tablet Take 5 mg by mouth at bedtime.     [name] apixaban (ELIQUIS) 2.5 MG TABS tablet Take 1 tablet (2.5 mg total) by mouth 2 (two) times daily. 60 tablet   . atorvastatin (LIPITOR) 20 MG tablet Take 1 tablet (20 mg total) by mouth daily at 6 PM. (Patient taking differently: Take 20 mg by mouth at bedtime. ) 30 tablet 0  . calcium acetate (PHOSLO) 667 MG capsule Take 667 mg by mouth See admin instructions. Take one capsule (667 mg) by mouth up to three times daily with meals    . carvedilol (COREG) 25 MG tablet Take 25 mg by mouth 2 (two) times daily.    . clotrimazole-betamethasone (LOTRISONE) cream Apply 1 application topically daily as needed (rash).   0  . diphenoxylate-atropine (LOMOTIL) 2.5-0.025 MG tablet Take 1 tablet by mouth every 12 (twelve) hours as needed for diarrhea or loose stools.     . hydrALAZINE (APRESOLINE) 25 MG tablet Take 25 mg by mouth 2 (  two) times daily.     . Melatonin 5 MG CAPS Take 5 mg by mouth at bedtime.    . pantoprazole (PROTONIX) 40 MG tablet Take 40 mg by mouth at bedtime.     . saccharomyces boulardii (FLORASTOR) 250 MG capsule Take 1 capsule (250 mg total) by mouth 2 (two) times daily. (Patient taking differently: Take 250 mg by mouth at bedtime. ) 20 capsule 0  . ticagrelor (BRILINTA) 90 MG TABS tablet Take 90 mg by mouth 2 (two) times daily.     [name] HYDROcodone-acetaminophen (NORCO) 5-325 MG tablet Take 1 tablet by mouth every 6 (six) hours as needed for moderate pain. (Patient not taking: Reported on 08/06/2019) 12 tablet 0  . pentafluoroprop-tetrafluoroeth (GEBAUERS) AERO Apply 1 application topically as needed (topical anesthesia for hemodialysis). (Patient not taking: Reported on 08/06/2019)  0  . polyethylene glycol (MIRALAX / GLYCOLAX) packet Take 17 g by mouth daily as needed for mild constipation. (Patient not taking: Reported on 08/06/2019) 14 each 0      Assessment: 63 y.o. female with h/o DVT, Eliquis on hold for possible amputation d/t cellulitis/osteomyelitis.  CBC stable, no reports of bleeding and ortho/vascular planning vascular studies and plan for procedure.  PTT and HL remains therapeutic at 70.7 seconds and  0.34 on 1300 units/hr.    Goal of Therapy: APTT 66-102 sec  Heparin level 0.3-0.7 units/ml Monitor platelets by anticoagulation protocol: Yes   Plan:  Continue heparin at 1300 units/hr Heparin level in AM F/u vascular and ortho plan  Alanda Slim, PharmD, [facility] Clinical Pharmacist Please see AMION for all Pharmacists' Contact Phone Numbers 08/09/2019, 4:26 AM

## 2019-08-09 NOTE — Progress Notes (Signed)
LLE vein mapping       has been completed. Preliminary results can be found under CV proc through chart review. June Leap, BS, RDMS, RVT

## 2019-08-09 NOTE — Care Management Important Message (Signed)
Important Message  Patient Details  Name: Kristen Berry MRN: [id number] Date of Birth: Feb 13, 1956   Medicare Important Message Given:  Yes     [name] [address] 08/09/2019, 3:43 PM

## 2019-08-09 NOTE — Progress Notes (Signed)
  Progress Note    08/09/2019 9:47 AM  Subjective:  No new complaints today   Vitals:   08/09/19 0025 08/09/19 0426  BP: (!) 91/52 (!) 125/54  Pulse: (!) 113 (!) 111  Resp:    Temp: 98.3 F (36.8 C) 98.3 F (36.8 C)  SpO2: 97% 97%   Physical Exam: Lungs:  Non labored Extremities:  Left foot cellulitis with GT gangrene; unable to palpate flow through L arm AV fistula Abdomen:  soft Neurologic: A&O  CBC    Component Value Date/Time   WBC 29.1 (H) 08/09/2019 0231   RBC 3.50 (L) 08/09/2019 0231   HGB 10.6 (L) 08/09/2019 0231   HGB 14.3 12/14/2016 1139   HCT 32.8 (L) 08/09/2019 0231   HCT 44.2 12/14/2016 1139   PLT 793 (H) 08/09/2019 0231   PLT 429 (H) 12/14/2016 1139   MCV 93.7 08/09/2019 0231   MCV 88.5 12/14/2016 1139   MCH 30.3 08/09/2019 0231   MCHC 32.3 08/09/2019 0231   RDW 16.2 (H) 08/09/2019 0231   RDW 17.6 (H) 12/14/2016 1139   LYMPHSABS 1.6 08/06/2019 2058   LYMPHSABS 1.9 12/14/2016 1139   MONOABS 1.6 (H) 08/06/2019 2058   MONOABS 0.7 12/14/2016 1139   EOSABS 0.2 08/06/2019 2058   EOSABS 0.0 10/17/2017 1430   BASOSABS 0.1 08/06/2019 2058   BASOSABS 0.1 12/14/2016 1139    BMET    Component Value Date/Time   NA 134 (L) 08/09/2019 0231   NA 137 12/14/2016 1139   K 3.7 08/09/2019 0231   K 4.2 12/14/2016 1139   CL 95 (L) 08/09/2019 0231   CO2 22 08/09/2019 0231   CO2 20 (L) 12/14/2016 1139   GLUCOSE 130 (H) 08/09/2019 0231   GLUCOSE 115 12/14/2016 1139   BUN 17 08/09/2019 0231   BUN 32.8 (H) 12/14/2016 1139   CREATININE 2.68 (H) 08/09/2019 0231   CREATININE 2.1 (H) 12/14/2016 1139   CALCIUM 8.1 (L) 08/09/2019 0231   CALCIUM 8.3 (L) 11/11/2017 0740   CALCIUM 9.5 12/14/2016 1139   GFRNONAA 18 (L) 08/09/2019 0231   GFRAA 21 (L) 08/09/2019 0231    INR    Component Value Date/Time   INR 1.6 (H) 08/07/2019 0529     Intake/Output Summary (Last 24 hours) at 08/09/2019 0947 Last data filed at 08/09/2019 5329 Gross per 24 hour  Intake  492.59 ml  Output 1600 ml  Net -1107.41 ml     Assessment/Plan:  63 y.o. female with L foot cellulitis with GT gangrene  Plan is for L femoral to BK popliteal artery bypass by Dr. Trula Slade on Tuesday 08/14/19 Vein mapping LLE ordered Holding brilinta and Eliquis Continue abx per primary team No palpable flow through L arm fistula s/p superficialization surgery 01/2019; permanent access can be addressed after limb salvage surgery LLE  DVT prophylaxis:  IV heparin   Dagoberto Ligas, PA-C [facility] [phone] 08/09/2019 9:47 AM

## 2019-08-09 NOTE — Progress Notes (Signed)
Renal Navigator attempted to meet with patient to discuss her supports and recent missed OP HD with reports of transportation issues. Patient was asleep at this time and did not respond to knocks at the door and calling her name. Renal Navigator reviewed chart and notes that recommendation is SNF at discharge and met with CSW/V. Crawford to discuss patient's reported situation. Renal Navigator is available to offer support and assistance as needed in addition to CSW.  Alphonzo Cruise, [place] Renal Navigator [phone]

## 2019-08-10 LAB — CBC
HCT: 31.1 % — ABNORMAL LOW (ref 36.0–46.0)
Hemoglobin: 9.8 g/dL — ABNORMAL LOW (ref 12.0–15.0)
MCH: 29.9 pg (ref 26.0–34.0)
MCHC: 31.5 g/dL (ref 30.0–36.0)
MCV: 94.8 fL (ref 80.0–100.0)
Platelets: 708 10*3/uL — ABNORMAL HIGH (ref 150–400)
RBC: 3.28 MIL/uL — ABNORMAL LOW (ref 3.87–5.11)
RDW: 16.3 % — ABNORMAL HIGH (ref 11.5–15.5)
WBC: 27.4 10*3/uL — ABNORMAL HIGH (ref 4.0–10.5)
nRBC: 0 % (ref 0.0–0.2)

## 2019-08-10 LAB — GLUCOSE, CAPILLARY
Glucose-Capillary: 117 mg/dL — ABNORMAL HIGH (ref 70–99)
Glucose-Capillary: 121 mg/dL — ABNORMAL HIGH (ref 70–99)
Glucose-Capillary: 155 mg/dL — ABNORMAL HIGH (ref 70–99)
Glucose-Capillary: 171 mg/dL — ABNORMAL HIGH (ref 70–99)
Glucose-Capillary: 82 mg/dL (ref 70–99)

## 2019-08-10 LAB — RENAL FUNCTION PANEL
Albumin: 2.3 g/dL — ABNORMAL LOW (ref 3.5–5.0)
Anion gap: 18 — ABNORMAL HIGH (ref 5–15)
BUN: 32 mg/dL — ABNORMAL HIGH (ref 8–23)
CO2: 21 mmol/L — ABNORMAL LOW (ref 22–32)
Calcium: 8 mg/dL — ABNORMAL LOW (ref 8.9–10.3)
Chloride: 90 mmol/L — ABNORMAL LOW (ref 98–111)
Creatinine, Ser: 3.79 mg/dL — ABNORMAL HIGH (ref 0.44–1.00)
GFR calc Af Amer: 14 mL/min — ABNORMAL LOW (ref >60)
GFR calc non Af Amer: 12 mL/min — ABNORMAL LOW (ref >60)
Glucose, Bld: 114 mg/dL — ABNORMAL HIGH (ref 70–99)
Phosphorus: 3.7 mg/dL (ref 2.5–4.6)
Potassium: 3.5 mmol/L (ref 3.5–5.1)
Sodium: 129 mmol/L — ABNORMAL LOW (ref 135–145)

## 2019-08-10 LAB — HEPARIN LEVEL (UNFRACTIONATED)
Heparin Unfractionated: 0.18 IU/mL — ABNORMAL LOW (ref 0.30–0.70)
Heparin Unfractionated: 0.23 IU/mL — ABNORMAL LOW (ref 0.30–0.70)

## 2019-08-10 MED ORDER — OXYCODONE HCL 5 MG PO TABS
10.0000 mg | ORAL_TABLET | ORAL | Status: DC | PRN
  Administered 2019-08-10 – 2019-08-22 (×17): 10 mg via ORAL
  Filled 2019-08-10 (×17): qty 2

## 2019-08-10 MED ORDER — DARBEPOETIN ALFA 40 MCG/0.4ML IJ SOSY
40.0000 ug | PREFILLED_SYRINGE | INTRAMUSCULAR | Status: DC
  Administered 2019-08-10: 40 ug via INTRAVENOUS
  Filled 2019-08-10: qty 0.4

## 2019-08-10 MED ORDER — HEPARIN SODIUM (PORCINE) 1000 UNIT/ML DIALYSIS
2400.0000 [IU] | Freq: Once | INTRAMUSCULAR | Status: AC
  Administered 2019-08-10: 3400 [IU] via INTRAVENOUS_CENTRAL

## 2019-08-10 MED ORDER — DARBEPOETIN ALFA 40 MCG/0.4ML IJ SOSY
PREFILLED_SYRINGE | INTRAMUSCULAR | Status: AC
  Filled 2019-08-10: qty 0.4

## 2019-08-10 MED ORDER — HYDROCODONE-ACETAMINOPHEN 5-325 MG PO TABS
1.0000 | ORAL_TABLET | ORAL | Status: DC | PRN
  Administered 2019-08-10: 1 via ORAL
  Filled 2019-08-10: qty 1

## 2019-08-10 MED ORDER — HEPARIN SODIUM (PORCINE) 1000 UNIT/ML IJ SOLN
INTRAMUSCULAR | Status: AC
  Filled 2019-08-10: qty 4

## 2019-08-10 MED ORDER — VANCOMYCIN HCL IN DEXTROSE 750-5 MG/150ML-% IV SOLN
INTRAVENOUS | Status: AC
  Filled 2019-08-10: qty 150

## 2019-08-10 MED ORDER — HYDROCODONE-ACETAMINOPHEN 5-325 MG PO TABS
ORAL_TABLET | ORAL | Status: AC
  Filled 2019-08-10: qty 1

## 2019-08-10 MED ORDER — HEPARIN SODIUM (PORCINE) 1000 UNIT/ML IJ SOLN
INTRAMUSCULAR | Status: AC
  Filled 2019-08-10: qty 3

## 2019-08-10 MED ORDER — HEPARIN BOLUS VIA INFUSION
1000.0000 [IU] | Freq: Once | INTRAVENOUS | Status: AC
  Administered 2019-08-10: 1000 [IU] via INTRAVENOUS
  Filled 2019-08-10: qty 1000

## 2019-08-10 NOTE — Progress Notes (Signed)
PROGRESS NOTE    Kristen Berry  UJW:[id number] DOB: 08/11/1956 DOA: 08/06/2019 PCP: [place], Sarah T, MD   Brief Narrative:  HPI On 11/10/202 by Dr. Gean Birchwood Kristen Berry is a 63 y.o. female with history of ESRD on hemodialysis on Monday Wednesday Friday, peripheral vascular disease status post stenting, history of DVT, anemia presents to the ER after patient has been noticing increasing swelling of the left foot increasing discharge and gangrene of the great toe noticed over the last 2 weeks.  Patient was admitted last month for cellulitis involving the left foot and great toe.  Patient states over the last 1 week patient missed 3 sessions of her dialysis due to transport issues.  Patient is mildly short of breath but not in distress.  Has noticed increasing swelling of the left foot with increasing discharge mostly from the left great toe which has become gangrenous of the last 2 weeks.   Interim history  Admitted with sepsis secondary to ischemia of the left foot/gangrene.  Orthopedics and vascular surgery consulted and appreciated.  Patient also ESRD patient, nephrology consulted. Assessment & Plan   Sepsis secondary to diabetic insensate neuropathy with critical limb ischemia of the left foot/great toe with cellulitis, osteomyelitis, gangrene -On admission, patient presented with leukocytosis, tachycardia, tachypnea -Orthopedic surgery consulted and appreciated.  He discussed options of possible revascularization versus trans to be amputation.  Patient declined amputation at this time. -Vascular surgery consulted and appreciated, plans for left femoral to BK popliteal artery bypass on 08/14/2019. -Currently on IV vancomycin and Zosyn  ESRD -Patient dialyzes Monday, Wednesday, Friday -Patient missed 1 week of hemodialysis due to living alone and her daughter moved out.  She had lack of reliable transportation due to wheelchair dependence. -Social work consulted -Nephrology  consulted and appreciated  Essential hypertension -BP controlled  -Continue Coreg and hydralazine.  Amlodipine currently held -Continue volume management with hemodialysis  Anemia of chronic disease -Hemoglobin 9.8 today, continue to monitor CBC  History of DVT -Eliquis currently held due to possible procedure -Continue IV heparin  Diabetes mellitus, type II -Diet controlled, continue to monitor CBGs  Thrombocytosis -Likely reactive to sepsis, continue to monitor CBC -Platelet number trending downward, currently 708 -Oncology consulted and appreciated, discussed with Dr. Alvy Bimler, felt that this is likely due to sepsis.  Patient can follow-up in the outpatient setting.  Hypokalemia -Resolved, continue to monitor BMP  History of CVA -currently on heparin, statin  Status post right BKA  DVT Prophylaxis Heparin  Code Status: Full  Family Communication: None at bedside  Disposition Plan: Admitted.  Pending surgery on 08/14/2019  Consultants Orthopedic surgery Nephrology Vascular surgery  Procedures  None  Antibiotics   Anti-infectives (From admission, onward)   Start     Dose/Rate Route Frequency Ordered Stop   08/10/19 0922  Vancomycin (VANCOCIN) 750-5 MG/150ML-% IVPB    Note to Pharmacy: Murriel Hopper   : cabinet override      08/10/19 0922 08/10/19 2129   08/08/19 1914  Vancomycin (VANCOCIN) 750-5 MG/150ML-% IVPB    Note to Pharmacy: Cherylann Banas   : cabinet override      08/08/19 1914 08/08/19 2111   08/08/19 1200  vancomycin (VANCOCIN) IVPB 750 mg/150 ml premix     750 mg 150 mL/hr over 60 Minutes Intravenous Every M-W-F (Hemodialysis) 08/06/19 2229     08/07/19 1930  vancomycin (VANCOCIN) IVPB 750 mg/150 ml premix     750 mg 150 mL/hr over 60 Minutes Intravenous  Once  08/07/19 1844 08/07/19 2135   08/07/19 0830  piperacillin-tazobactam (ZOSYN) IVPB 2.25 g     2.25 g 100 mL/hr over 30 Minutes Intravenous Every 8 hours 08/07/19 0812     08/07/19 0800   piperacillin-tazobactam (ZOSYN) IVPB 3.375 g  Status:  Discontinued     3.375 g 12.5 mL/hr over 240 Minutes Intravenous Every 12 hours 08/07/19 0303 08/07/19 0812   08/06/19 2230  piperacillin-tazobactam (ZOSYN) IVPB 3.375 g     3.375 g 100 mL/hr over 30 Minutes Intravenous  Once 08/06/19 2222 08/06/19 2342   08/06/19 2230  vancomycin (VANCOCIN) 1,750 mg in sodium chloride 0.9 % 500 mL IVPB     1,750 mg 250 mL/hr over 120 Minutes Intravenous  Once 08/06/19 2226 08/07/19 0429      Subjective:   Kristen Berry seen and examined today.  Patient seen in hemodialysis this morning.  Continues to complain of left foot pain.  Denies current chest pain, shortness breath, abdominal pain, nausea or vomiting, diarrhea or constipation, dizziness or headache. Objective:   Vitals:   08/09/19 2258 08/10/19 0113 08/10/19 0422 08/10/19 0733  BP: (!) 102/41 (!) 135/54 (!) 125/55 (!) 132/56  Pulse:   (!) 107 88  Resp:   20   Temp:   98.7 F (37.1 C) 98 F (36.7 C)  TempSrc:   Oral Oral  SpO2:   97% 96%  Weight:      Height:        Intake/Output Summary (Last 24 hours) at 08/10/2019 1104 Last data filed at 08/10/2019 0700 Gross per 24 hour  Intake 737 ml  Output -  Net 737 ml   Filed Weights   08/07/19 1837 08/08/19 0304 08/08/19 1810  Weight: 76.2 kg 82 kg 82 kg   Exam  General: Well developed, well nourished, NAD, appears stated age  HEENT: NCAT, mucous membranes moist.   Cardiovascular: S1 S2 auscultated, RRR, no murmur  Respiratory: Clear to auscultation bilaterally   Abdomen: Soft, nontender, nondistended, + bowel sounds  Extremities: Right BKA.  Left great toe with gangrene, otherwise no cyanosis clubbing or edema of LLE.  Neuro: AAOx3, nonfocal  Psych: Appropriate mood and affect  Data Reviewed: I have personally reviewed following labs and imaging studies  CBC: Recent Labs  Lab 08/06/19 2058 08/07/19 0529 08/08/19 0225 08/09/19 0231 08/10/19 0223  WBC 27.3* 27.7*  26.5* 29.1* 27.4*  NEUTROABS 23.4*  --   --   --   --   HGB 12.0 10.9* 11.0* 10.6* 9.8*  HCT 37.0 35.0* 33.1* 32.8* 31.1*  MCV 94.9 95.9 91.2 93.7 94.8  PLT 965* 893* 822* 793* 353*   Basic Metabolic Panel: Recent Labs  Lab 08/06/19 2058 08/07/19 0529 08/08/19 0225 08/09/19 0231 08/10/19 0223  NA 133* 131* 133* 134* 129*  K 3.3* 3.2* 3.2* 3.7 3.5  CL 95* 97* 94* 95* 90*  CO2 10* 10* 20* 22 21*  GLUCOSE 107* 75 125* 130* 114*  BUN 102* 101* 30* 17 32*  CREATININE 5.48* 5.27* 2.94* 2.68* 3.79*  CALCIUM 7.5* 7.3* 8.0* 8.1* 8.0*  MG 2.3  --   --   --   --   PHOS  --   --   --  2.8 3.7   GFR: Estimated Creatinine Clearance: 17.2 mL/min (A) (by C-G formula based on SCr of 3.79 mg/dL (H)). Liver Function Tests: Recent Labs  Lab 08/06/19 2235 08/09/19 0231 08/10/19 0223  AST 10*  --   --   ALT 7  --   --  ALKPHOS 162*  --   --   BILITOT 0.9  --   --   PROT 8.0  --   --   ALBUMIN 2.9* 2.5* 2.3*   No results for input(s): LIPASE, AMYLASE in the last 168 hours. No results for input(s): AMMONIA in the last 168 hours. Coagulation Profile: Recent Labs  Lab 08/07/19 0529  INR 1.6*   Cardiac Enzymes: No results for input(s): CKTOTAL, CKMB, CKMBINDEX, TROPONINI in the last 168 hours. BNP (last 3 results) No results for input(s): PROBNP in the last 8760 hours. HbA1C: No results for input(s): HGBA1C in the last 72 hours. CBG: Recent Labs  Lab 08/09/19 1226 08/09/19 1709 08/09/19 2031 08/10/19 0023 08/10/19 0418  GLUCAP 130* 109* 149* 117* 121*   Lipid Profile: No results for input(s): CHOL, HDL, LDLCALC, TRIG, CHOLHDL, LDLDIRECT in the last 72 hours. Thyroid Function Tests: No results for input(s): TSH, T4TOTAL, FREET4, T3FREE, THYROIDAB in the last 72 hours. Anemia Panel: No results for input(s): VITAMINB12, FOLATE, FERRITIN, TIBC, IRON, RETICCTPCT in the last 72 hours. Urine analysis:    Component Value Date/Time   COLORURINE YELLOW 10/17/2017 1402    APPEARANCEUR HAZY (A) 10/17/2017 1402   LABSPEC 1.013 10/17/2017 1402   PHURINE 5.0 10/17/2017 1402   GLUCOSEU NEGATIVE 10/17/2017 1402   HGBUR NEGATIVE 10/17/2017 1402   BILIRUBINUR NEGATIVE 10/17/2017 1402   KETONESUR 5 (A) 10/17/2017 1402   PROTEINUR NEGATIVE 10/17/2017 1402   NITRITE NEGATIVE 10/17/2017 1402   LEUKOCYTESUR NEGATIVE 10/17/2017 1402   Sepsis Labs: @LABRCNTIP (procalcitonin:4,lacticidven:4)  ) Recent Results (from the past 240 hour(s))  Blood culture (routine x 2)     Status: None (Preliminary result)   Collection Time: 08/06/19  8:24 PM   Specimen: BLOOD  Result Value Ref Range Status   Specimen Description BLOOD RIGHT ANTECUBITAL  Final   Special Requests   Final    BOTTLES DRAWN AEROBIC AND ANAEROBIC Blood Culture adequate volume   Culture   Final    NO GROWTH 3 DAYS Performed at [facility], [place] [address] [place], [place] [address]    Report Status PENDING  Incomplete  Blood culture (routine x 2)     Status: None (Preliminary result)   Collection Time: 08/06/19  9:44 PM   Specimen: BLOOD RIGHT HAND  Result Value Ref Range Status   Specimen Description BLOOD RIGHT HAND  Final   Special Requests   Final    BOTTLES DRAWN AEROBIC ONLY Blood Culture results may not be optimal due to an inadequate volume of blood received in culture bottles   Culture   Final    NO GROWTH 3 DAYS Performed at [facility], [place] [address] [place], [place] [address]    Report Status PENDING  Incomplete  SARS CORONAVIRUS 2 (TAT 6-24 HRS) Nasopharyngeal Nasopharyngeal Swab     Status: None   Collection Time: 08/06/19 11:05 PM   Specimen: Nasopharyngeal Swab  Result Value Ref Range Status   SARS Coronavirus 2 NEGATIVE NEGATIVE Final    Comment: (NOTE) SARS-CoV-2 target nucleic acids are NOT DETECTED. The SARS-CoV-2 RNA is generally detectable in upper and lower respiratory specimens during the acute phase of infection. Negative results do not preclude  SARS-CoV-2 infection, do not rule out co-infections with other pathogens, and should not be used as the sole basis for treatment or other patient management decisions. Negative results must be combined with clinical observations, patient history, and epidemiological information. The expected result is Negative. Fact Sheet  for Patients: SugarRoll.be Fact Sheet for Healthcare Providers: https://www.woods-mathews.com/ This test is not yet approved or cleared by the [place] FDA and  has been authorized for detection and/or diagnosis of SARS-CoV-2 by FDA under an Emergency Use Authorization (EUA). This EUA will remain  in effect (meaning this test can be used) for the duration of the COVID-19 declaration under Section 56 4(b)(1) of the Act, 21 U.S.C. section 360bbb-3(b)(1), unless the authorization is terminated or revoked sooner. Performed at [facility], [place] [address] [place], [place] [address]   MRSA PCR Screening     Status: Abnormal   Collection Time: 08/07/19  8:26 PM   Specimen: Nasal Mucosa; Nasopharyngeal  Result Value Ref Range Status   MRSA by PCR POSITIVE (A) NEGATIVE Final    Comment:        The GeneXpert MRSA Assay (FDA approved for NASAL specimens only), is one component of a comprehensive MRSA colonization surveillance program. It is not intended to diagnose MRSA infection nor to guide or monitor treatment for MRSA infections. RESULT CALLED TO, READ BACK BY AND VERIFIED WITH: Dorena Bodo RN 08/07/19 2221 JDW Performed at [facility], [place] [address], [place], [place] [address]       Radiology Studies: Vas [place] Lower Extremity Saphenous Vein Mapping  Result Date: 08/09/2019 LOWER EXTREMITY VEIN MAPPING Indications: pre op bypass  Performing Technologist: June Leap RDMS, RVT  Examination Guidelines: A complete evaluation includes B-mode imaging, spectral Doppler, color Doppler, and power Doppler as  needed of all accessible portions of each vessel. Bilateral testing is considered an integral part of a complete examination. Limited examinations for reoccurring indications may be performed as noted. +---------------+-----------+----------------------+---------------+-----------+   RT Diameter  RT Findings         GSV            LT Diameter  LT Findings      (cm)                                            (cm)                  +---------------+-----------+----------------------+---------------+-----------+                               Saphenofemoral         0.71                                                   Junction                                  +---------------+-----------+----------------------+---------------+-----------+                               Proximal thigh         0.66                  +---------------+-----------+----------------------+---------------+-----------+  Mid thigh            0.60       branching  +---------------+-----------+----------------------+---------------+-----------+                                Distal thigh          0.55                  +---------------+-----------+----------------------+---------------+-----------+                                    Knee              0.66                  +---------------+-----------+----------------------+---------------+-----------+                                 Prox calf            0.62                  +---------------+-----------+----------------------+---------------+-----------+                                  Mid calf            0.47       branching  +---------------+-----------+----------------------+---------------+-----------+                                Distal calf           0.45                  +---------------+-----------+----------------------+---------------+-----------+ Diagnosing physician: Servando Snare MD  Electronically signed by Servando Snare MD on 08/09/2019 at 3:31:06 PM.    Final      Scheduled Meds: . atorvastatin  20 mg Oral QHS  . calcium acetate  667 mg Oral TID WC  . carvedilol  12.5 mg Oral BID  . Chlorhexidine Gluconate Cloth  6 each Topical Q0600  . Chlorhexidine Gluconate Cloth  6 each Topical Q0600  . darbepoetin (ARANESP) injection - DIALYSIS  40 mcg Intravenous Q Fri-HD  . heparin      . heparin      . [START ON 08/11/2019] heparin  2,400 Units Dialysis Once in dialysis  . HYDROcodone-acetaminophen      . mupirocin ointment  1 application Nasal BID  . pantoprazole  40 mg Oral Daily  . saccharomyces boulardii  250 mg Oral QHS   Continuous Infusions: . heparin 1,500 Units/hr (08/10/19 0343)  . piperacillin-tazobactam (ZOSYN)  IV 2.25 g (08/10/19 0500)  . Vancomycin    . vancomycin 750 mg (08/10/19 1042)     LOS: 4 days   Time Spent in minutes   30 minutes  Jazzmine Kleiman D.O. on 08/10/2019 at 11:04 AM  Between 7am to 7pm - Please see pager noted on [email or web address]  After 7pm go to [email or web address]  And look for the night coverage person covering for me after hours  [facility] Office  [phone]

## 2019-08-10 NOTE — Progress Notes (Signed)
Patient went to dialysis. Alert and oriented, no acute distress noted, no complaints at this time. VS stable.

## 2019-08-10 NOTE — Progress Notes (Signed)
Patient back from dialysis; alert and oriented x 4; complaining of pain on left foot 8/10; MD notified because pain medicine is not due at this time. VS stable. Will continue to monitor.

## 2019-08-10 NOTE — Progress Notes (Signed)
Subjective:  On hd  Now on schedule MWF,cos Left foot pain   Objective Vital signs in last 24 hours: Vitals:   08/09/19 2258 08/10/19 0113 08/10/19 0422 08/10/19 0733  BP: (!) 102/41 (!) 135/54 (!) 125/55 (!) 132/56  Pulse:   (!) 107 88  Resp:   20   Temp:   98.7 F (37.1 C) 98 F (36.7 C)  TempSrc:   Oral Oral  SpO2:   97% 96%  Weight:      Height:       Weight change:   Physical Exam: General- elderly Female nad, alert,  Chest -clear bilat Card- RRR no MRG Abd - Obese, Bs +,soft ntnd no mass or ascites  Extrem-R BKA, no pretibedema HD Access-  R TDC 400 bfr on HD ,, LUA AVF+bruit (not using)  Home meds: - amlodipine 5/ carvedilol 25 bid/ hydralazine 25 bid - apixaban 2.5 bid - atorvastatin 20 hs/ ticagrelor 90 bid - pantoprazole 40 hs/ calc acetate ac tid - prn's/ vitamins/ supplements  Outpt HD:MWF [facility]  4h 350/800 80kg 2/2.25 bath TDC Heparin 2400 - LUE AVF (poorly matured, not using UNTIL  LOWER EXTREM Infect/ PAD issue stable ) - calc 0.25 tiw  Problem/Plan: 1. Gangrene  Left   Foot.- On Zosyn, Vanco/  For revasc procedure/ surgery per [facility] on 11/17.  2. ESRD - MWF HD. Pt missed one week HD, living alone since dtr moved out and doesn't have reliable transportation due to [place] dependence. Consulting renal SW. HD again today. Back on Schedule  3. Malaise - - due to uremia, resolved.  4. HTN/ vol - up 2kg,( BED WT)  BP's soft, will lower BP meds 5. Anemia  ckd - Hb 11 > 10.6> 9.8  start Aranesp 40 today / HO TFS 15% Feritin 208  =07/25/19  Op labs hold iron with infection  /gangr issue 6. Secondary Hyperparathyroidism - po vit d  On Hd  Corec Ca 9.4  Phos 3.2  On Ca acetate 1 ac  7. H/o CVA 8. Chron anticoag - on eliquis, ho dvt  Per op [facility] history    Ernest Haber, PA-C [facility] [phone] 08/10/2019,9:57 AM  LOS: 4 days   Labs: Basic Metabolic Panel: Recent Labs  Lab 08/08/19 0225 08/09/19 0231  08/10/19 0223  NA 133* 134* 129*  K 3.2* 3.7 3.5  CL 94* 95* 90*  CO2 20* 22 21*  GLUCOSE 125* 130* 114*  BUN 30* 17 32*  CREATININE 2.94* 2.68* 3.79*  CALCIUM 8.0* 8.1* 8.0*  PHOS  --  2.8 3.7   Liver Function Tests: Recent Labs  Lab 08/06/19 2235 08/09/19 0231 08/10/19 0223  AST 10*  --   --   ALT 7  --   --   ALKPHOS 162*  --   --   BILITOT 0.9  --   --   PROT 8.0  --   --   ALBUMIN 2.9* 2.5* 2.3*   No results for input(s): LIPASE, AMYLASE in the last 168 hours. No results for input(s): AMMONIA in the last 168 hours. CBC: Recent Labs  Lab 08/06/19 2058 08/07/19 0529 08/08/19 0225 08/09/19 0231 08/10/19 0223  WBC 27.3* 27.7* 26.5* 29.1* 27.4*  NEUTROABS 23.4*  --   --   --   --   HGB 12.0 10.9* 11.0* 10.6* 9.8*  HCT 37.0 35.0* 33.1* 32.8* 31.1*  MCV 94.9 95.9 91.2 93.7 94.8  PLT 965* 893* 822* 793* 708*  Cardiac Enzymes: No results for input(s): CKTOTAL, CKMB, CKMBINDEX, TROPONINI in the last 168 hours. CBG: Recent Labs  Lab 08/09/19 1226 08/09/19 1709 08/09/19 2031 08/10/19 0023 08/10/19 0418  GLUCAP 130* 109* 149* 117* 121*    Medications: . heparin 1,500 Units/hr (08/10/19 0343)  . piperacillin-tazobactam (ZOSYN)  IV 2.25 g (08/10/19 0500)  . Vancomycin    . vancomycin 750 mg (08/08/19 2013)   . atorvastatin  20 mg Oral QHS  . calcium acetate  667 mg Oral TID WC  . carvedilol  12.5 mg Oral BID  . Chlorhexidine Gluconate Cloth  6 each Topical Q0600  . Chlorhexidine Gluconate Cloth  6 each Topical Q0600  . heparin      . heparin      . [START ON 08/11/2019] heparin  2,400 Units Dialysis Once in dialysis  . HYDROcodone-acetaminophen      . mupirocin ointment  1 application Nasal BID  . pantoprazole  40 mg Oral Daily  . saccharomyces boulardii  250 mg Oral QHS

## 2019-08-10 NOTE — Progress Notes (Signed)
PT Cancellation Note  Patient Details Name: Kristen Berry MRN: [id number] DOB: 11-Mar-1956   Cancelled Treatment:    Reason Eval/Treat Not Completed: Patient at procedure or test/unavailable (HD).  Ellamae Sia, PT, DPT Acute Rehabilitation Services Pager [phone] Office [phone]    Willy Eddy 08/10/2019, 9:11 AM

## 2019-08-10 NOTE — Progress Notes (Signed)
ANTICOAGULATION CONSULT NOTE - Follow Up Consult  Pharmacy Consult for heparin Indication: h/o DVT  Labs: Recent Labs    08/08/19 0225 08/08/19 1125 08/09/19 0231 08/10/19 0223 08/10/19 1312  HGB 11.0*  --  10.6* 9.8*  --   HCT 33.1*  --  32.8* 31.1*  --   PLT 822*  --  793* 708*  --   APTT 83* 68* 71*  --   --   HEPARINUNFRC 0.80*  --  0.34 0.18* 0.23*  CREATININE 2.94*  --  2.68* 3.79*  --     Assessment: 63yo female continues on heparin for DVT history Heparin level still low at 0.23  Goal of Therapy:  Heparin level 0.3-0.7 units/ml   Plan:  Heparin to 1700 units / hr Follow up AM labs  Thank you Anette Guarneri, PharmD   08/10/2019,2:10 PM

## 2019-08-10 NOTE — Progress Notes (Signed)
Patient's Blood pressure was 102/41 and Map 60 asymptomatic. On call [facility] provider notified.

## 2019-08-10 NOTE — Progress Notes (Signed)
Patient's HR elevated in 115-118; at this time patient is asymptomatic, no acute distress noted. MD made aware. Will continue to monitor.

## 2019-08-10 NOTE — Progress Notes (Addendum)
+---------------+-----------+----------------------+---------------+-----------+   RT Diameter  RT Findings         GSV            LT Diameter  LT Findings      (cm)                                            (cm)                  +---------------+-----------+----------------------+---------------+-----------+                               Saphenofemoral         0.71                                                   Junction                                  +---------------+-----------+----------------------+---------------+-----------+                               Proximal thigh         0.66                  +---------------+-----------+----------------------+---------------+-----------+                                 Mid thigh            0.60       branching  +---------------+-----------+----------------------+---------------+-----------+                                Distal thigh          0.55                  +---------------+-----------+----------------------+---------------+-----------+                                    Knee              0.66                  +---------------+-----------+----------------------+---------------+-----------+                                 Prox calf            0.62                  +---------------+-----------+----------------------+---------------+-----------+                                  Mid calf            0.47       branching  +---------------+-----------+----------------------+---------------+-----------+  Distal calf           0.45                  +---------------+-----------+----------------------+---------------+-----------+  Left foot cellulitis and GT dry gangrene with pain.  Plan L femoral to BK popliteal artery bypass by Dr. Trula Slade on Tuesday 08/14/19.  She acceptable for bypass right GSV.  Roxy Horseman PA-C  Patient  seen and examined in dialysis.  She continues to complain of pain in her left great toe.  I have her scheduled for left femoral below-knee popliteal bypass graft with vein on Tuesday.  She is now off of her anticoagulation and I am waiting for her Brilinta to be held for 5 days.  I am stopping her Norco and changing her to oxycodone for pain management  Annamarie Major

## 2019-08-10 NOTE — Progress Notes (Signed)
ANTICOAGULATION CONSULT NOTE - Follow Up Consult  Pharmacy Consult for heparin Indication: h/o DVT  Labs: Recent Labs    08/07/19 0529  08/08/19 0225 08/08/19 1125 08/09/19 0231 08/10/19 0223  HGB 10.9*  --  11.0*  --  10.6* 9.8*  HCT 35.0*  --  33.1*  --  32.8* 31.1*  PLT 893*  --  822*  --  793* 708*  APTT 70*   < > 83* 68* 71*  --   LABPROT 18.4*  --   --   --   --   --   INR 1.6*  --   --   --   --   --   HEPARINUNFRC 1.28*  --  0.80*  --  0.34 0.18*  CREATININE 5.27*  --  2.94*  --  2.68*  --    < > = values in this interval not displayed.    Assessment: 63yo female subtherapeutic on heparin after several PTTs at goal, likely d/t Eliquis clearing; no gtt issues or signs of bleeding per RN.  Goal of Therapy:  Heparin level 0.3-0.7 units/ml   Plan:  Will give small bolus of heparin 1000 units and increase heparin gtt by 2-3 units/kg/hr to 1500 units/hr and check level in 8 hours.    Wynona Neat, PharmD, BCPS  08/10/2019,3:19 AM

## 2019-08-11 LAB — HEPARIN LEVEL (UNFRACTIONATED)
Heparin Unfractionated: 0.16 IU/mL — ABNORMAL LOW (ref 0.30–0.70)
Heparin Unfractionated: 0.22 [IU]/mL — ABNORMAL LOW (ref 0.30–0.70)
Heparin Unfractionated: 0.2 [IU]/mL — ABNORMAL LOW (ref 0.30–0.70)

## 2019-08-11 LAB — RENAL FUNCTION PANEL
Albumin: 2.2 g/dL — ABNORMAL LOW (ref 3.5–5.0)
Anion gap: 16 — ABNORMAL HIGH (ref 5–15)
BUN: 17 mg/dL (ref 8–23)
CO2: 23 mmol/L (ref 22–32)
Calcium: 8.2 mg/dL — ABNORMAL LOW (ref 8.9–10.3)
Chloride: 92 mmol/L — ABNORMAL LOW (ref 98–111)
Creatinine, Ser: 3.07 mg/dL — ABNORMAL HIGH (ref 0.44–1.00)
GFR calc Af Amer: 18 mL/min — ABNORMAL LOW (ref >60)
GFR calc non Af Amer: 15 mL/min — ABNORMAL LOW (ref >60)
Glucose, Bld: 143 mg/dL — ABNORMAL HIGH (ref 70–99)
Phosphorus: 2.5 mg/dL (ref 2.5–4.6)
Potassium: 2.8 mmol/L — ABNORMAL LOW (ref 3.5–5.1)
Sodium: 131 mmol/L — ABNORMAL LOW (ref 135–145)

## 2019-08-11 LAB — CULTURE, BLOOD (ROUTINE X 2)
Culture: NO GROWTH
Culture: NO GROWTH
Special Requests: ADEQUATE

## 2019-08-11 LAB — CBC
HCT: 31.2 % — ABNORMAL LOW (ref 36.0–46.0)
Hemoglobin: 9.9 g/dL — ABNORMAL LOW (ref 12.0–15.0)
MCH: 29.7 pg (ref 26.0–34.0)
MCHC: 31.7 g/dL (ref 30.0–36.0)
MCV: 93.7 fL (ref 80.0–100.0)
Platelets: 616 K/uL — ABNORMAL HIGH (ref 150–400)
RBC: 3.33 MIL/uL — ABNORMAL LOW (ref 3.87–5.11)
RDW: 15.9 % — ABNORMAL HIGH (ref 11.5–15.5)
WBC: 25.1 K/uL — ABNORMAL HIGH (ref 4.0–10.5)
nRBC: 0 % (ref 0.0–0.2)

## 2019-08-11 LAB — GLUCOSE, CAPILLARY
Glucose-Capillary: 140 mg/dL — ABNORMAL HIGH (ref 70–99)
Glucose-Capillary: 140 mg/dL — ABNORMAL HIGH (ref 70–99)
Glucose-Capillary: 158 mg/dL — ABNORMAL HIGH (ref 70–99)
Glucose-Capillary: 162 mg/dL — ABNORMAL HIGH (ref 70–99)
Glucose-Capillary: 176 mg/dL — ABNORMAL HIGH (ref 70–99)
Glucose-Capillary: 221 mg/dL — ABNORMAL HIGH (ref 70–99)

## 2019-08-11 LAB — MAGNESIUM: Magnesium: 1.9 mg/dL (ref 1.7–2.4)

## 2019-08-11 MED ORDER — POTASSIUM CHLORIDE CRYS ER 20 MEQ PO TBCR
40.0000 meq | EXTENDED_RELEASE_TABLET | Freq: Once | ORAL | Status: AC
  Administered 2019-08-11: 40 meq via ORAL
  Filled 2019-08-11: qty 2

## 2019-08-11 NOTE — Progress Notes (Addendum)
Subjective:  Seen I room , said tolerated  hd yesterday on schedule MWF,cos Left foot pain   Objective Vital signs in last 24 hours: Vitals:   08/11/19 0555 08/11/19 0807 08/11/19 1215 08/11/19 1217  BP:  (!) 122/49 (!) 129/49   Pulse: 74 (!) 105 (!) 111 (!) 112  Resp: 12 16 14 14   Temp:  98.4 F (36.9 C)  98.3 F (36.8 C)  TempSrc:  Oral    SpO2: 96% 97% 96% 97%  Weight:      Height:       Weight change:   Physical Exam: General- elderly Female nad, alert,  Chest -clear bilat Card- RRR no MRG Abd - Obese, Bs +,soft ntnd no mass or ascites  Extrem-R BKA, no pretibedema, L foot bandaged  Clean / dry/ Malodorous smelling   HD Access-  R TDC 400 bfr on HD ,, LUA AVF+bruit (not using)  Home meds: - amlodipine 5/ carvedilol 25 bid/ hydralazine 25 bid - apixaban 2.5 bid - atorvastatin 20 hs/ ticagrelor 90 bid - pantoprazole 40 hs/ calc acetate ac tid - prn's/ vitamins/ supplements  Outpt HD:MWF [facility]  4h 350/800 80kg 2/2.25 bath TDC Heparin 2400 - LUE AVF (poorly matured, not using UNTIL  LOWER EXTREM Infect/ PAD issue stable ) - calc 0.25 tiw  Problem/Plan: 1. Gangrene  Left   Foot.- On Zosyn, Vanco/  For revasc procedure/ surgery per [facility] on 11/17. 2. ESRD - MWF HD. Pt missed one week HD, living alone since dtr moved out and doesn't have reliable transportation due to [place] dependence. Consulting renal SW. HD again today.Back on Schedule  3. Hypokalemia = k 2.8  Being given po supplement  Noted also on Carb mod diet   Fu k labs  ^ k bath next hd   4. Malaise - - due to [employer]. 5. HTN/ vol -up 2kg,( BED WT)  BP's soft, have  lowered most  BP meds 6. Anemia  ckd - Hb 11 > 10.6> 9.8>9.9   start Aranesp 40  Hd 11/13 / HO TFS 15% Feritin 208  =07/25/19  Op labs hold iron with infection  /gangr issue 7. Secondary Hyperparathyroidism - po vit d  On Hd  Corec Ca 9.4  Phos  2.5   On Ca acetate 1 ac will dc with phos trend down  8. H/o  CVA\ 9. Chron anticoag - on eliquis, ho dvt  Per op [facility] history   Ernest Haber, PA-C [facility] Beeper [phone] 08/11/2019,3:09 PM  LOS: 5 days   Labs: Basic Metabolic Panel: Recent Labs  Lab 08/09/19 0231 08/10/19 0223 08/11/19 0258  NA 134* 129* 131*  K 3.7 3.5 2.8*  CL 95* 90* 92*  CO2 22 21* 23  GLUCOSE 130* 114* 143*  BUN 17 32* 17  CREATININE 2.68* 3.79* 3.07*  CALCIUM 8.1* 8.0* 8.2*  PHOS 2.8 3.7 2.5   Liver Function Tests: Recent Labs  Lab 08/06/19 2235 08/09/19 0231 08/10/19 0223 08/11/19 0258  AST 10*  --   --   --   ALT 7  --   --   --   ALKPHOS 162*  --   --   --   BILITOT 0.9  --   --   --   PROT 8.0  --   --   --   ALBUMIN 2.9* 2.5* 2.3* 2.2*   No results for input(s): LIPASE, AMYLASE in the last 168 hours. No results for input(s): AMMONIA in the  last 168 hours. CBC: Recent Labs  Lab 08/06/19 2058 08/07/19 0529 08/08/19 0225 08/09/19 0231 08/10/19 0223 08/11/19 0258  WBC 27.3* 27.7* 26.5* 29.1* 27.4* 25.1*  NEUTROABS 23.4*  --   --   --   --   --   HGB 12.0 10.9* 11.0* 10.6* 9.8* 9.9*  HCT 37.0 35.0* 33.1* 32.8* 31.1* 31.2*  MCV 94.9 95.9 91.2 93.7 94.8 93.7  PLT 965* 893* 822* 793* 708* 616*   Cardiac Enzymes: No results for input(s): CKTOTAL, CKMB, CKMBINDEX, TROPONINI in the last 168 hours. CBG: Recent Labs  Lab 08/10/19 2010 08/11/19 0012 08/11/19 0432 08/11/19 0720 08/11/19 1248  GLUCAP 155* 176* 140* 140* 162*    Studies/Results: No results found. Medications: . heparin 2,000 Units/hr (08/11/19 1325)  . piperacillin-tazobactam (ZOSYN)  IV 2.25 g (08/11/19 1318)  . vancomycin 750 mg (08/10/19 2134)   . atorvastatin  20 mg Oral QHS  . calcium acetate  667 mg Oral TID WC  . carvedilol  12.5 mg Oral BID  . Chlorhexidine Gluconate Cloth  6 each Topical Q0600  . Chlorhexidine Gluconate Cloth  6 each Topical Q0600  . darbepoetin (ARANESP) injection - DIALYSIS  40 mcg Intravenous Q Fri-HD  .  mupirocin ointment  1 application Nasal BID  . pantoprazole  40 mg Oral Daily  . saccharomyces boulardii  250 mg Oral QHS

## 2019-08-11 NOTE — Progress Notes (Signed)
ANTICOAGULATION CONSULT NOTE - Follow Up Consult  Pharmacy Consult for heparin Indication: h/o DVT  Labs: Recent Labs    08/08/19 1125  08/09/19 0231 08/10/19 0223 08/10/19 1312 08/11/19 0258  HGB  --    < > 10.6* 9.8*  --  9.9*  HCT  --   --  32.8* 31.1*  --  31.2*  PLT  --   --  793* 708*  --  616*  APTT 68*  --  71*  --   --   --   HEPARINUNFRC  --    < > 0.34 0.18* 0.23* 0.16*  CREATININE  --   --  2.68* 3.79*  --   --    < > = values in this interval not displayed.    Assessment: 63yo female continues on heparin for DVT history Heparin level still low at 0.16  Goal of Therapy:  Heparin level 0.3-0.7 units/ml   Plan:  Increase Heparin to 1850 units / hr Level in 8 hours Follow up AM labs  Alanda Slim, PharmD, [place] Pharmacist Please see AMION for all Pharmacists' Contact Phone Numbers 08/11/2019, 3:43 AM

## 2019-08-11 NOTE — Progress Notes (Signed)
PROGRESS NOTE    Kristen Berry  JSH:[id number] DOB: 1956-06-19 DOA: 08/06/2019 PCP: [place], Sarah T, MD   Brief Narrative:  HPI On 11/10/202 by Dr. Gean Birchwood Kristen Berry is a 63 y.o. female with history of ESRD on hemodialysis on Monday Wednesday Friday, peripheral vascular disease status post stenting, history of DVT, anemia presents to the ER after patient has been noticing increasing swelling of the left foot increasing discharge and gangrene of the great toe noticed over the last 2 weeks.  Patient was admitted last month for cellulitis involving the left foot and great toe.  Patient states over the last 1 week patient missed 3 sessions of her dialysis due to transport issues.  Patient is mildly short of breath but not in distress.  Has noticed increasing swelling of the left foot with increasing discharge mostly from the left great toe which has become gangrenous of the last 2 weeks.   Interim history  Admitted with sepsis secondary to ischemia of the left foot/gangrene.  Orthopedics and vascular surgery consulted and appreciated.  Patient also ESRD patient, nephrology consulted. Assessment & Plan   Sepsis secondary to diabetic insensate neuropathy with critical limb ischemia of the left foot/great toe with cellulitis, osteomyelitis, gangrene -On admission, patient presented with leukocytosis, tachycardia, tachypnea -Orthopedic surgery consulted and appreciated.  He discussed options of possible revascularization versus trans to be amputation.  Patient declined amputation at this time. -Vascular surgery consulted and appreciated, plans for left femoral to BK popliteal artery bypass on 08/14/2019. -Currently on IV vancomycin and Zosyn  ESRD -Patient dialyzes Monday, Wednesday, Friday -Patient missed 1 week of hemodialysis due to living alone and her daughter moved out.  She had lack of reliable transportation due to wheelchair dependence. -Social work consulted -Nephrology  consulted and appreciated  Hypokalemia -potassium today 2.8, will replace and continue to monitor -will also check magnesium level  Essential hypertension -BP controlled  -Continue Coreg and hydralazine.  Amlodipine currently held -Continue volume management with hemodialysis  Anemia of chronic disease -Hemoglobin 9.9 today, continue to monitor CBC  History of DVT -Eliquis currently held due to possible procedure -Continue IV heparin  Diabetes mellitus, type II -Diet controlled, continue to monitor CBGs  Thrombocytosis -Likely reactive to sepsis, continue to monitor CBC -Platelet number trending downward, currently 616 -Oncology consulted and appreciated, discussed with Dr. Alvy Bimler, felt that this is likely due to sepsis.  Patient can follow-up in the outpatient setting.  Hypokalemia -Resolved, continue to monitor BMP  History of CVA -currently on heparin, statin  Status post right BKA  DVT Prophylaxis Heparin  Code Status: Full  Family Communication: None at bedside  Disposition Plan: Admitted.  Pending surgery on 08/14/2019  Consultants Orthopedic surgery Nephrology Vascular surgery  Procedures  None  Antibiotics   Anti-infectives (From admission, onward)   Start     Dose/Rate Route Frequency Ordered Stop   08/10/19 0922  Vancomycin (VANCOCIN) 750-5 MG/150ML-% IVPB    Note to Pharmacy: Murriel Hopper   : cabinet override      08/10/19 0922 08/10/19 2129   08/08/19 1914  Vancomycin (VANCOCIN) 750-5 MG/150ML-% IVPB    Note to Pharmacy: Cherylann Banas   : cabinet override      08/08/19 1914 08/08/19 2111   08/08/19 1200  vancomycin (VANCOCIN) IVPB 750 mg/150 ml premix     750 mg 150 mL/hr over 60 Minutes Intravenous Every M-W-F (Hemodialysis) 08/06/19 2229     08/07/19 1930  vancomycin (VANCOCIN) IVPB 750 mg/150  ml premix     750 mg 150 mL/hr over 60 Minutes Intravenous  Once 08/07/19 1844 08/07/19 2135   08/07/19 0830  piperacillin-tazobactam (ZOSYN)  IVPB 2.25 g     2.25 g 100 mL/hr over 30 Minutes Intravenous Every 8 hours 08/07/19 0812     08/07/19 0800  piperacillin-tazobactam (ZOSYN) IVPB 3.375 g  Status:  Discontinued     3.375 g 12.5 mL/hr over 240 Minutes Intravenous Every 12 hours 08/07/19 0303 08/07/19 0812   08/06/19 2230  piperacillin-tazobactam (ZOSYN) IVPB 3.375 g     3.375 g 100 mL/hr over 30 Minutes Intravenous  Once 08/06/19 2222 08/06/19 2342   08/06/19 2230  vancomycin (VANCOCIN) 1,750 mg in sodium chloride 0.9 % 500 mL IVPB     1,750 mg 250 mL/hr over 120 Minutes Intravenous  Once 08/06/19 2226 08/07/19 0429      Subjective:   Ramey seen and examined today.  No new complaints this morning.  Still complains of left foot pain.  Denies chest pain, shortness breath, abdominal pain, nausea vomiting, diarrhea constipation, dizziness or headache. Objective:   Vitals:   08/10/19 2008 08/11/19 0553 08/11/19 0555 08/11/19 0807  BP: (!) 131/54 (!) 119/37  (!) 122/49  Pulse: (!) 109 76 74 (!) 105  Resp: 19 13 12 16   Temp: 98.9 F (37.2 C) 98.8 F (37.1 C)  98.4 F (36.9 C)  TempSrc: Oral Oral  Oral  SpO2: 95% 97% 96% 97%  Weight:      Height:        Intake/Output Summary (Last 24 hours) at 08/11/2019 1014 Last data filed at 08/11/2019 0402 Gross per 24 hour  Intake 717.25 ml  Output 1785 ml  Net -1067.75 ml   Filed Weights   08/10/19 0755 08/10/19 1145 08/10/19 1224  Weight: 82 kg 79.5 kg 81.7 kg   Exam  General: Well developed, well nourished, NAD, appears stated age  HEENT: NCAT, mucous membranes moist.   Cardiovascular: S1 S2 auscultated, RRR, no murmur  Respiratory: Clear to auscultation bilaterally with equal chest rise  Abdomen: Soft, nontender, nondistended, + bowel sounds  Extremities: R BKA. Left great toe with gangrene  Neuro: AAOx3, nonfocal  Psych: Appropriate mood and affect  Data Reviewed: I have personally reviewed following labs and imaging studies  CBC: Recent Labs   Lab 08/06/19 2058 08/07/19 0529 08/08/19 0225 08/09/19 0231 08/10/19 0223 08/11/19 0258  WBC 27.3* 27.7* 26.5* 29.1* 27.4* 25.1*  NEUTROABS 23.4*  --   --   --   --   --   HGB 12.0 10.9* 11.0* 10.6* 9.8* 9.9*  HCT 37.0 35.0* 33.1* 32.8* 31.1* 31.2*  MCV 94.9 95.9 91.2 93.7 94.8 93.7  PLT 965* 893* 822* 793* 708* 833*   Basic Metabolic Panel: Recent Labs  Lab 08/06/19 2058 08/07/19 0529 08/08/19 0225 08/09/19 0231 08/10/19 0223 08/11/19 0258  NA 133* 131* 133* 134* 129* 131*  K 3.3* 3.2* 3.2* 3.7 3.5 2.8*  CL 95* 97* 94* 95* 90* 92*  CO2 10* 10* 20* 22 21* 23  GLUCOSE 107* 75 125* 130* 114* 143*  BUN 102* 101* 30* 17 32* 17  CREATININE 5.48* 5.27* 2.94* 2.68* 3.79* 3.07*  CALCIUM 7.5* 7.3* 8.0* 8.1* 8.0* 8.2*  MG 2.3  --   --   --   --   --   PHOS  --   --   --  2.8 3.7 2.5   GFR: Estimated Creatinine Clearance: 21.2 mL/min (A) (by C-G formula  based on SCr of 3.07 mg/dL (H)). Liver Function Tests: Recent Labs  Lab 08/06/19 2235 08/09/19 0231 08/10/19 0223 08/11/19 0258  AST 10*  --   --   --   ALT 7  --   --   --   ALKPHOS 162*  --   --   --   BILITOT 0.9  --   --   --   PROT 8.0  --   --   --   ALBUMIN 2.9* 2.5* 2.3* 2.2*   No results for input(s): LIPASE, AMYLASE in the last 168 hours. No results for input(s): AMMONIA in the last 168 hours. Coagulation Profile: Recent Labs  Lab 08/07/19 0529  INR 1.6*   Cardiac Enzymes: No results for input(s): CKTOTAL, CKMB, CKMBINDEX, TROPONINI in the last 168 hours. BNP (last 3 results) No results for input(s): PROBNP in the last 8760 hours. HbA1C: No results for input(s): HGBA1C in the last 72 hours. CBG: Recent Labs  Lab 08/10/19 1726 08/10/19 2010 08/11/19 0012 08/11/19 0432 08/11/19 0720  GLUCAP 171* 155* 176* 140* 140*   Lipid Profile: No results for input(s): CHOL, HDL, LDLCALC, TRIG, CHOLHDL, LDLDIRECT in the last 72 hours. Thyroid Function Tests: No results for input(s): TSH, T4TOTAL, FREET4,  T3FREE, THYROIDAB in the last 72 hours. Anemia Panel: No results for input(s): VITAMINB12, FOLATE, FERRITIN, TIBC, IRON, RETICCTPCT in the last 72 hours. Urine analysis:    Component Value Date/Time   COLORURINE YELLOW 10/17/2017 1402   APPEARANCEUR HAZY (A) 10/17/2017 1402   LABSPEC 1.013 10/17/2017 1402   PHURINE 5.0 10/17/2017 1402   GLUCOSEU NEGATIVE 10/17/2017 1402   HGBUR NEGATIVE 10/17/2017 1402   BILIRUBINUR NEGATIVE 10/17/2017 1402   KETONESUR 5 (A) 10/17/2017 1402   PROTEINUR NEGATIVE 10/17/2017 1402   NITRITE NEGATIVE 10/17/2017 1402   LEUKOCYTESUR NEGATIVE 10/17/2017 1402   Sepsis Labs: @LABRCNTIP (procalcitonin:4,lacticidven:4)  ) Recent Results (from the past 240 hour(s))  Blood culture (routine x 2)     Status: None (Preliminary result)   Collection Time: 08/06/19  8:24 PM   Specimen: BLOOD  Result Value Ref Range Status   Specimen Description BLOOD RIGHT ANTECUBITAL  Final   Special Requests   Final    BOTTLES DRAWN AEROBIC AND ANAEROBIC Blood Culture adequate volume   Culture   Final    NO GROWTH 4 DAYS Performed at [facility], [place] [address] [place], [place] [address]    Report Status PENDING  Incomplete  Blood culture (routine x 2)     Status: None (Preliminary result)   Collection Time: 08/06/19  9:44 PM   Specimen: BLOOD RIGHT HAND  Result Value Ref Range Status   Specimen Description BLOOD RIGHT HAND  Final   Special Requests   Final    BOTTLES DRAWN AEROBIC ONLY Blood Culture results may not be optimal due to an inadequate volume of blood received in culture bottles   Culture   Final    NO GROWTH 4 DAYS Performed at [facility], [place] [address], [place], [place] [address]    Report Status PENDING  Incomplete  SARS CORONAVIRUS 2 (TAT 6-24 HRS) Nasopharyngeal Nasopharyngeal Swab     Status: None   Collection Time: 08/06/19 11:05 PM   Specimen: Nasopharyngeal Swab  Result Value Ref Range Status   SARS Coronavirus 2 NEGATIVE NEGATIVE  Final    Comment: (NOTE) SARS-CoV-2 target nucleic acids are NOT DETECTED. The SARS-CoV-2 RNA is generally detectable in upper and lower respiratory specimens  during the acute phase of infection. Negative results do not preclude SARS-CoV-2 infection, do not rule out co-infections with other pathogens, and should not be used as the sole basis for treatment or other patient management decisions. Negative results must be combined with clinical observations, patient history, and epidemiological information. The expected result is Negative. Fact Sheet for Patients: SugarRoll.be Fact Sheet for Healthcare Providers: https://www.woods-mathews.com/ This test is not yet approved or cleared by the [place] FDA and  has been authorized for detection and/or diagnosis of SARS-CoV-2 by FDA under an Emergency Use Authorization (EUA). This EUA will remain  in effect (meaning this test can be used) for the duration of the COVID-19 declaration under Section 56 4(b)(1) of the Act, 21 U.S.C. section 360bbb-3(b)(1), unless the authorization is terminated or revoked sooner. Performed at [facility], [place] [address] [place], [place] [address]   MRSA PCR Screening     Status: Abnormal   Collection Time: 08/07/19  8:26 PM   Specimen: Nasal Mucosa; Nasopharyngeal  Result Value Ref Range Status   MRSA by PCR POSITIVE (A) NEGATIVE Final    Comment:        The GeneXpert MRSA Assay (FDA approved for NASAL specimens only), is one component of a comprehensive MRSA colonization surveillance program. It is not intended to diagnose MRSA infection nor to guide or monitor treatment for MRSA infections. RESULT CALLED TO, READ BACK BY AND VERIFIED WITH: Dorena Bodo RN 08/07/19 2221 JDW Performed at [facility], [place] [address], [place],  [address]       Radiology Studies: Vas [place] Lower Extremity Saphenous Vein Mapping  Result Date:  08/09/2019 LOWER EXTREMITY VEIN MAPPING Indications: pre op bypass  Performing Technologist: June Leap RDMS, RVT  Examination Guidelines: A complete evaluation includes B-mode imaging, spectral Doppler, color Doppler, and power Doppler as needed of all accessible portions of each vessel. Bilateral testing is considered an integral part of a complete examination. Limited examinations for reoccurring indications may be performed as noted. +---------------+-----------+----------------------+---------------+-----------+   RT Diameter  RT Findings         GSV            LT Diameter  LT Findings      (cm)                                            (cm)                  +---------------+-----------+----------------------+---------------+-----------+                               Saphenofemoral         0.71                                                   Junction                                  +---------------+-----------+----------------------+---------------+-----------+  Proximal thigh         0.66                  +---------------+-----------+----------------------+---------------+-----------+                                 Mid thigh            0.60       branching  +---------------+-----------+----------------------+---------------+-----------+                                Distal thigh          0.55                  +---------------+-----------+----------------------+---------------+-----------+                                    Knee              0.66                  +---------------+-----------+----------------------+---------------+-----------+                                 Prox calf            0.62                  +---------------+-----------+----------------------+---------------+-----------+                                  Mid calf            0.47       branching   +---------------+-----------+----------------------+---------------+-----------+                                Distal calf           0.45                  +---------------+-----------+----------------------+---------------+-----------+ Diagnosing physician: Servando Snare MD Electronically signed by Servando Snare MD on 08/09/2019 at 3:31:06 PM.    Final      Scheduled Meds: . atorvastatin  20 mg Oral QHS  . calcium acetate  667 mg Oral TID WC  . carvedilol  12.5 mg Oral BID  . Chlorhexidine Gluconate Cloth  6 each Topical Q0600  . Chlorhexidine Gluconate Cloth  6 each Topical Q0600  . darbepoetin (ARANESP) injection - DIALYSIS  40 mcg Intravenous Q Fri-HD  . mupirocin ointment  1 application Nasal BID  . pantoprazole  40 mg Oral Daily  . saccharomyces boulardii  250 mg Oral QHS   Continuous Infusions: . heparin 1,850 Units/hr (08/11/19 0402)  . piperacillin-tazobactam (ZOSYN)  IV 2.25 g (08/11/19 0557)  . vancomycin 750 mg (08/10/19 2134)     LOS: 5 days   Time Spent in minutes   30 minutes  Kinzee Happel D.O. on 08/11/2019 at 10:14 AM  Between 7am to 7pm - Please see pager noted on [email or web address]  After 7pm go to [email or web address]  And look for the night coverage person covering for me after hours  [facility] Office  [phone]

## 2019-08-11 NOTE — Progress Notes (Signed)
ANTICOAGULATION CONSULT NOTE - [facility] for Heparin Indication: h/o DVT  Allergies  Allergen Reactions  . Crestor [Rosuvastatin Calcium] Other (See Comments)    Leg pain    Patient Measurements: Height: 5\' 9"  (175.3 cm) Weight: 180 lb 1.9 oz (81.7 kg) IBW/kg (Calculated) : 66.2 Heparin dosing weight: 82 kg  Vital Signs: Temp: 98.1 F (36.7 C) (11/14 2015) Temp Source: Oral (11/14 2015) BP: 118/49 (11/14 1704) Pulse Rate: 108 (11/14 1704)  Labs: Recent Labs    08/09/19 0231 08/10/19 0223  08/11/19 0258 08/11/19 1147 08/11/19 2129  HGB 10.6* 9.8*  --  9.9*  --   --   HCT 32.8* 31.1*  --  31.2*  --   --   PLT 793* 708*  --  616*  --   --   APTT 71*  --   --   --   --   --   HEPARINUNFRC 0.34 0.18*   < > 0.16* 0.22* 0.20*  CREATININE 2.68* 3.79*  --  3.07*  --   --    < > = values in this interval not displayed.    Estimated Creatinine Clearance: 21.2 mL/min (A) (by C-G formula based on SCr of 3.07 mg/dL (H)).   Medical History: Past Medical History:  Diagnosis Date  . Anemia   . Arthritis    "hands, knees" (10/10/2017)  . Asthma   . CHF (congestive heart failure) ([place])   . Coronary artery disease   . ESRD on hemodialysis [facility])    Dialysis T/ Th/ Sat  . H/O Clostridium difficile infection   . High cholesterol   . History of kidney stones   . Hypertension   . PVD (peripheral vascular disease) ([place])    "LLE; will have OR" (10/10/2017)  . Sleep apnea    "never given mask" (10/10/2017)  . Stroke [facility])    mild stroke mid April - 2019  . Type II diabetes mellitus ([place])    "no RX anymore" (10/10/2017)    Assessment: 63 y.o. female with h/o DVT, Eliquis on hold for possible amputation d/t cellulitis/osteomyelitis on 11/17. Pharmacy asked to dose IV heparin.  Heparin level remains subtherapeutic on 2000 units/hr.  Goal of Therapy: Heparin level 0.3-0.7 units/ml Monitor platelets by anticoagulation protocol: Yes   Plan:   -Increase heparin to 2200 units/hr -Recheck heparin level in [place], PharmD, BCPS Clinical Pharmacist [phone] Please check AMION for all [place] numbers 08/11/2019

## 2019-08-11 NOTE — Progress Notes (Signed)
ANTICOAGULATION CONSULT NOTE - Initial Consult  Pharmacy Consult for Heparin Indication: h/o DVT  Allergies  Allergen Reactions  . Crestor [Rosuvastatin Calcium] Other (See Comments)    Leg pain    Patient Measurements: Height: 5\' 9"  (175.3 cm) Weight: 180 lb 1.9 oz (81.7 kg) IBW/kg (Calculated) : 66.2 Heparin dosing weight: 82 kg  Vital Signs: Temp: 98.3 F (36.8 C) (11/14 1217) Temp Source: Oral (11/14 0807) BP: 129/49 (11/14 1215) Pulse Rate: 112 (11/14 1217)  Labs: Recent Labs    08/09/19 0231 08/10/19 0223 08/10/19 1312 08/11/19 0258 08/11/19 1147  HGB 10.6* 9.8*  --  9.9*  --   HCT 32.8* 31.1*  --  31.2*  --   PLT 793* 708*  --  616*  --   APTT 71*  --   --   --   --   HEPARINUNFRC 0.34 0.18* 0.23* 0.16* 0.22*  CREATININE 2.68* 3.79*  --  3.07*  --     Estimated Creatinine Clearance: 21.2 mL/min (A) (by C-G formula based on SCr of 3.07 mg/dL (H)).   Medical History: Past Medical History:  Diagnosis Date  . Anemia   . Arthritis    "hands, knees" (10/10/2017)  . Asthma   . CHF (congestive heart failure) ([place])   . Coronary artery disease   . ESRD on hemodialysis [facility])    Dialysis T/ Th/ Sat  . H/O Clostridium difficile infection   . High cholesterol   . History of kidney stones   . Hypertension   . PVD (peripheral vascular disease) ([place])    "LLE; will have OR" (10/10/2017)  . Sleep apnea    "never given mask" (10/10/2017)  . Stroke [facility])    mild stroke mid April - 2019  . Type II diabetes mellitus ([place])    "no RX anymore" (10/10/2017)    Assessment: 63 y.o. female with h/o DVT, Eliquis on hold for possible amputation d/t cellulitis/osteomyelitis on 11/17. Pharmacy asked to dose IV heparin.  Heparin level still subtherapeutic at 0.22 despite increasing drip rate to 1850 units/hr. Hgb low but stable, platelets elevated at 616 trending down. No overt bleeding or infusion issues per RN.  Goal of Therapy: Heparin level 0.3-0.7 units/ml Monitor  platelets by anticoagulation protocol: Yes   Plan:  Increase Heparin IV to 2000 units/hr Check 8 hr HL Daily HL, CBC Monitor for bleeding  Richardine Service, PharmD PGY1 Pharmacy Resident Phone: [phone] 08/11/2019  1:21 PM  Please check [email or web address] for unit-specific pharmacy phone numbers.

## 2019-08-11 NOTE — Progress Notes (Signed)
Pharmacy Antibiotic Note  Kristen Berry is a 63 y.o. female admitted on 08/06/2019 with cellulitis/osteomyelitis of L great toe.  Pharmacy has been consulted for vancomycin/Zosyn dosing.   ESRD on HD MWF. Last session 11/13. Patient reports missing 3 HD sessions in last 1 week PTA. BCx negative to date. WBC still elevated but trending down at 25.1. Afebrile. Procedure planned for 11/17.  Plan: Zosyn 2.25g IV q8h Vancomycin 750mg  IV qHD Monitor clinical progress, c/s, abx plan/LOT Pre-HD vancomycin level as indicated F/u HD schedule/tolerance inpatient   Height: 5\' 9"  (175.3 cm) Weight: 180 lb 1.9 oz (81.7 kg) IBW/kg (Calculated) : 66.2  Temp (24hrs), Avg:98.5 F (36.9 C), Min:98.3 F (36.8 C), Max:98.9 F (37.2 C)  Recent Labs  Lab 08/06/19 2058 08/07/19 0529 08/08/19 0225 08/09/19 0231 08/10/19 0223 08/11/19 0258  WBC 27.3* 27.7* 26.5* 29.1* 27.4* 25.1*  CREATININE 5.48* 5.27* 2.94* 2.68* 3.79* 3.07*  LATICACIDVEN 1.3 0.9  --   --   --   --     Estimated Creatinine Clearance: 21.2 mL/min (A) (by C-G formula based on SCr of 3.07 mg/dL (H)).    Allergies  Allergen Reactions  . Crestor [Rosuvastatin Calcium] Other (See Comments)    Leg pain   Microbiology Results:  11/9 BCx: ngtd 11/10 MRSA PCR: neg  Richardine Service, PharmD PGY1 Pharmacy Resident Phone: [phone] 08/11/2019  1:36 PM  Please check [email or web address] for unit-specific pharmacy phone numbers.

## 2019-08-12 LAB — CBC
HCT: 29.7 % — ABNORMAL LOW (ref 36.0–46.0)
Hemoglobin: 9.5 g/dL — ABNORMAL LOW (ref 12.0–15.0)
MCH: 30.4 pg (ref 26.0–34.0)
MCHC: 32 g/dL (ref 30.0–36.0)
MCV: 94.9 fL (ref 80.0–100.0)
Platelets: 525 10*3/uL — ABNORMAL HIGH (ref 150–400)
RBC: 3.13 MIL/uL — ABNORMAL LOW (ref 3.87–5.11)
RDW: 16.2 % — ABNORMAL HIGH (ref 11.5–15.5)
WBC: 26.2 10*3/uL — ABNORMAL HIGH (ref 4.0–10.5)
nRBC: 0 % (ref 0.0–0.2)

## 2019-08-12 LAB — BASIC METABOLIC PANEL
Anion gap: 17 — ABNORMAL HIGH (ref 5–15)
BUN: 34 mg/dL — ABNORMAL HIGH (ref 8–23)
CO2: 22 mmol/L (ref 22–32)
Calcium: 8.4 mg/dL — ABNORMAL LOW (ref 8.9–10.3)
Chloride: 93 mmol/L — ABNORMAL LOW (ref 98–111)
Creatinine, Ser: 4.66 mg/dL — ABNORMAL HIGH (ref 0.44–1.00)
GFR calc Af Amer: 11 mL/min — ABNORMAL LOW (ref >60)
GFR calc non Af Amer: 9 mL/min — ABNORMAL LOW (ref >60)
Glucose, Bld: 185 mg/dL — ABNORMAL HIGH (ref 70–99)
Potassium: 3.6 mmol/L (ref 3.5–5.1)
Sodium: 132 mmol/L — ABNORMAL LOW (ref 135–145)

## 2019-08-12 LAB — GLUCOSE, CAPILLARY
Glucose-Capillary: 138 mg/dL — ABNORMAL HIGH (ref 70–99)
Glucose-Capillary: 158 mg/dL — ABNORMAL HIGH (ref 70–99)
Glucose-Capillary: 160 mg/dL — ABNORMAL HIGH (ref 70–99)
Glucose-Capillary: 171 mg/dL — ABNORMAL HIGH (ref 70–99)
Glucose-Capillary: 173 mg/dL — ABNORMAL HIGH (ref 70–99)
Glucose-Capillary: 243 mg/dL — ABNORMAL HIGH (ref 70–99)

## 2019-08-12 LAB — HEPARIN LEVEL (UNFRACTIONATED)
Heparin Unfractionated: 0.5 IU/mL (ref 0.30–0.70)
Heparin Unfractionated: 0.62 IU/mL (ref 0.30–0.70)

## 2019-08-12 NOTE — Progress Notes (Signed)
PROGRESS NOTE    Kristen Berry  GEX:[id number] DOB: Mar 17, 1956 DOA: 08/06/2019 PCP: [place], Sarah T, MD   Brief Narrative:  HPI On 11/10/202 by Dr. Gean Birchwood Kristen Berry is a 63 y.o. female with history of ESRD on hemodialysis on Monday Wednesday Friday, peripheral vascular disease status post stenting, history of DVT, anemia presents to the ER after patient has been noticing increasing swelling of the left foot increasing discharge and gangrene of the great toe noticed over the last 2 weeks.  Patient was admitted last month for cellulitis involving the left foot and great toe.  Patient states over the last 1 week patient missed 3 sessions of her dialysis due to transport issues.  Patient is mildly short of breath but not in distress.  Has noticed increasing swelling of the left foot with increasing discharge mostly from the left great toe which has become gangrenous of the last 2 weeks.   Interim history  Admitted with sepsis secondary to ischemia of the left foot/gangrene.  Orthopedics and vascular surgery consulted and appreciated.  Patient also ESRD patient, nephrology consulted. Assessment & Plan   Sepsis secondary to diabetic insensate neuropathy with critical limb ischemia of the left foot/great toe with cellulitis, osteomyelitis, gangrene -On admission, patient presented with leukocytosis, tachycardia, tachypnea -Orthopedic surgery consulted and appreciated.  He discussed options of possible revascularization versus trans to be amputation.  Patient declined amputation at this time. -Vascular surgery consulted and appreciated, plans for left femoral to BK popliteal artery bypass on 08/14/2019. -Currently on IV vancomycin and Zosyn  ESRD -Patient dialyzes Monday, Wednesday, Friday -Patient missed 1 week of hemodialysis due to living alone and her daughter moved out.  She had lack of reliable transportation due to wheelchair dependence. -Social work consulted -Nephrology  consulted and appreciated  Hypokalemia -Potassium up to 3.6 today -magnesium 1.9  Essential hypertension -BP controlled  -Continue Coreg and hydralazine.  Amlodipine currently held -Continue volume management with hemodialysis  Anemia of chronic disease -Hemoglobin 9.5 today, continue to monitor CBC  History of DVT -Eliquis currently held due to possible procedure -Continue IV heparin  Diabetes mellitus, type II -Diet controlled, continue to monitor CBGs  Thrombocytosis -Likely reactive to sepsis, continue to monitor CBC -Platelet number trending downward, currently 525 -Oncology consulted and appreciated, discussed with Dr. Alvy Bimler, felt that this is likely due to sepsis.  Patient can follow-up in the outpatient setting.  Hypokalemia -Resolved, continue to monitor BMP  History of CVA -currently on heparin, statin  Status post right BKA  DVT Prophylaxis Heparin  Code Status: Full  Family Communication: None at bedside  Disposition Plan: Admitted.  Pending surgery on 08/14/2019  Consultants Orthopedic surgery Nephrology Vascular surgery  Procedures  None  Antibiotics   Anti-infectives (From admission, onward)   Start     Dose/Rate Route Frequency Ordered Stop   08/10/19 0922  Vancomycin (VANCOCIN) 750-5 MG/150ML-% IVPB    Note to Pharmacy: Murriel Hopper   : cabinet override      08/10/19 0922 08/10/19 2129   08/08/19 1914  Vancomycin (VANCOCIN) 750-5 MG/150ML-% IVPB    Note to Pharmacy: Cherylann Banas   : cabinet override      08/08/19 1914 08/08/19 2111   08/08/19 1200  vancomycin (VANCOCIN) IVPB 750 mg/150 ml premix     750 mg 150 mL/hr over 60 Minutes Intravenous Every M-W-F (Hemodialysis) 08/06/19 2229     08/07/19 1930  vancomycin (VANCOCIN) IVPB 750 mg/150 ml premix     750  mg 150 mL/hr over 60 Minutes Intravenous  Once 08/07/19 1844 08/07/19 2135   08/07/19 0830  piperacillin-tazobactam (ZOSYN) IVPB 2.25 g     2.25 g 100 mL/hr over 30 Minutes  Intravenous Every 8 hours 08/07/19 0812     08/07/19 0800  piperacillin-tazobactam (ZOSYN) IVPB 3.375 g  Status:  Discontinued     3.375 g 12.5 mL/hr over 240 Minutes Intravenous Every 12 hours 08/07/19 0303 08/07/19 0812   08/06/19 2230  piperacillin-tazobactam (ZOSYN) IVPB 3.375 g     3.375 g 100 mL/hr over 30 Minutes Intravenous  Once 08/06/19 2222 08/06/19 2342   08/06/19 2230  vancomycin (VANCOCIN) 1,750 mg in sodium chloride 0.9 % 500 mL IVPB     1,750 mg 250 mL/hr over 120 Minutes Intravenous  Once 08/06/19 2226 08/07/19 0429      Subjective:   Kristen Berry seen and examined today.  Patient with no complaints today. Denies chest pain, shortness of breath, abdominal pain, N/V/D/C. Objective:   Vitals:   08/12/19 0603 08/12/19 0643 08/12/19 0825 08/12/19 0932  BP:   (!) 104/50 (!) 116/48  Pulse:  93 92 97  Resp:   16   Temp:   98.2 F (36.8 C)   TempSrc:   Oral   SpO2:  95% 98%   Weight: 83.3 kg     Height:        Intake/Output Summary (Last 24 hours) at 08/12/2019 1029 Last data filed at 08/12/2019 0700 Gross per 24 hour  Intake 825.25 ml  Output -  Net 825.25 ml   Filed Weights   08/10/19 1145 08/10/19 1224 08/12/19 0603  Weight: 79.5 kg 81.7 kg 83.3 kg   Exam  General: Well developed, well nourished, NAD, appears stated age  55: NCAT, mucous membranes moist.   Cardiovascular: S1 S2 auscultated, RRR, no murmur  Respiratory: Clear to auscultation bilaterally   Abdomen: Soft, nontender, nondistended, + bowel sounds  Extremities: R BKA, L great toe with gangrene  Neuro: AAOx3, nonfocal  Psych: Appropriate mood and affect   Data Reviewed: I have personally reviewed following labs and imaging studies  CBC: Recent Labs  Lab 08/06/19 2058  08/08/19 0225 08/09/19 0231 08/10/19 0223 08/11/19 0258 08/12/19 0714  WBC 27.3*   < > 26.5* 29.1* 27.4* 25.1* 26.2*  NEUTROABS 23.4*  --   --   --   --   --   --   HGB 12.0   < > 11.0* 10.6* 9.8* 9.9* 9.5*   HCT 37.0   < > 33.1* 32.8* 31.1* 31.2* 29.7*  MCV 94.9   < > 91.2 93.7 94.8 93.7 94.9  PLT 965*   < > 822* 793* 708* 616* 525*   < > = values in this interval not displayed.   Basic Metabolic Panel: Recent Labs  Lab 08/06/19 2058  08/08/19 0225 08/09/19 0231 08/10/19 0223 08/11/19 0258 08/12/19 0714  NA 133*   < > 133* 134* 129* 131* 132*  K 3.3*   < > 3.2* 3.7 3.5 2.8* 3.6  CL 95*   < > 94* 95* 90* 92* 93*  CO2 10*   < > 20* 22 21* 23 22  GLUCOSE 107*   < > 125* 130* 114* 143* 185*  BUN 102*   < > 30* 17 32* 17 34*  CREATININE 5.48*   < > 2.94* 2.68* 3.79* 3.07* 4.66*  CALCIUM 7.5*   < > 8.0* 8.1* 8.0* 8.2* 8.4*  MG 2.3  --   --   --   --  1.9  --   PHOS  --   --   --  2.8 3.7 2.5  --    < > = values in this interval not displayed.   GFR: Estimated Creatinine Clearance: 14.1 mL/min (A) (by C-G formula based on SCr of 4.66 mg/dL (H)). Liver Function Tests: Recent Labs  Lab 08/06/19 2235 08/09/19 0231 08/10/19 0223 08/11/19 0258  AST 10*  --   --   --   ALT 7  --   --   --   ALKPHOS 162*  --   --   --   BILITOT 0.9  --   --   --   PROT 8.0  --   --   --   ALBUMIN 2.9* 2.5* 2.3* 2.2*   No results for input(s): LIPASE, AMYLASE in the last 168 hours. No results for input(s): AMMONIA in the last 168 hours. Coagulation Profile: Recent Labs  Lab 08/07/19 0529  INR 1.6*   Cardiac Enzymes: No results for input(s): CKTOTAL, CKMB, CKMBINDEX, TROPONINI in the last 168 hours. BNP (last 3 results) No results for input(s): PROBNP in the last 8760 hours. HbA1C: No results for input(s): HGBA1C in the last 72 hours. CBG: Recent Labs  Lab 08/11/19 1710 08/11/19 2019 08/12/19 0037 08/12/19 0412 08/12/19 0839  GLUCAP 158* 221* 171* 160* 158*   Lipid Profile: No results for input(s): CHOL, HDL, LDLCALC, TRIG, CHOLHDL, LDLDIRECT in the last 72 hours. Thyroid Function Tests: No results for input(s): TSH, T4TOTAL, FREET4, T3FREE, THYROIDAB in the last 72 hours. Anemia  Panel: No results for input(s): VITAMINB12, FOLATE, FERRITIN, TIBC, IRON, RETICCTPCT in the last 72 hours. Urine analysis:    Component Value Date/Time   COLORURINE YELLOW 10/17/2017 1402   APPEARANCEUR HAZY (A) 10/17/2017 1402   LABSPEC 1.013 10/17/2017 1402   PHURINE 5.0 10/17/2017 1402   GLUCOSEU NEGATIVE 10/17/2017 1402   HGBUR NEGATIVE 10/17/2017 1402   BILIRUBINUR NEGATIVE 10/17/2017 1402   KETONESUR 5 (A) 10/17/2017 1402   PROTEINUR NEGATIVE 10/17/2017 1402   NITRITE NEGATIVE 10/17/2017 1402   LEUKOCYTESUR NEGATIVE 10/17/2017 1402   Sepsis Labs: @LABRCNTIP (procalcitonin:4,lacticidven:4)  ) Recent Results (from the past 240 hour(s))  Blood culture (routine x 2)     Status: None   Collection Time: 08/06/19  8:24 PM   Specimen: BLOOD  Result Value Ref Range Status   Specimen Description BLOOD RIGHT ANTECUBITAL  Final   Special Requests   Final    BOTTLES DRAWN AEROBIC AND ANAEROBIC Blood Culture adequate volume   Culture   Final    NO GROWTH 5 DAYS Performed at [facility], [place] [address], [place], [place] [address]    Report Status 08/11/2019 FINAL  Final  Blood culture (routine x 2)     Status: None   Collection Time: 08/06/19  9:44 PM   Specimen: BLOOD RIGHT HAND  Result Value Ref Range Status   Specimen Description BLOOD RIGHT HAND  Final   Special Requests   Final    BOTTLES DRAWN AEROBIC ONLY Blood Culture results may not be optimal due to an inadequate volume of blood received in culture bottles   Culture   Final    NO GROWTH 5 DAYS Performed at [facility], [place] [address], [place],  [address]    Report Status 08/11/2019 FINAL  Final  SARS CORONAVIRUS 2 (TAT 6-24 HRS) Nasopharyngeal Nasopharyngeal Swab     Status: None   Collection Time: 08/06/19 11:05 PM  Specimen: Nasopharyngeal Swab  Result Value Ref Range Status   SARS Coronavirus 2 NEGATIVE NEGATIVE Final    Comment: (NOTE) SARS-CoV-2 target nucleic acids are NOT DETECTED. The  SARS-CoV-2 RNA is generally detectable in upper and lower respiratory specimens during the acute phase of infection. Negative results do not preclude SARS-CoV-2 infection, do not rule out co-infections with other pathogens, and should not be used as the sole basis for treatment or other patient management decisions. Negative results must be combined with clinical observations, patient history, and epidemiological information. The expected result is Negative. Fact Sheet for Patients: SugarRoll.be Fact Sheet for Healthcare Providers: https://www.woods-mathews.com/ This test is not yet approved or cleared by the [place] FDA and  has been authorized for detection and/or diagnosis of SARS-CoV-2 by FDA under an Emergency Use Authorization (EUA). This EUA will remain  in effect (meaning this test can be used) for the duration of the COVID-19 declaration under Section 56 4(b)(1) of the Act, 21 U.S.C. section 360bbb-3(b)(1), unless the authorization is terminated or revoked sooner. Performed at [facility], [place] [address] [place], [place] [address]   MRSA PCR Screening     Status: Abnormal   Collection Time: 08/07/19  8:26 PM   Specimen: Nasal Mucosa; Nasopharyngeal  Result Value Ref Range Status   MRSA by PCR POSITIVE (A) NEGATIVE Final    Comment:        The GeneXpert MRSA Assay (FDA approved for NASAL specimens only), is one component of a comprehensive MRSA colonization surveillance program. It is not intended to diagnose MRSA infection nor to guide or monitor treatment for MRSA infections. RESULT CALLED TO, READ BACK BY AND VERIFIED WITH: Dorena Bodo RN 08/07/19 2221 JDW Performed at [facility], [place] [address], [place], [place] [address]       Radiology Studies: No results found.   Scheduled Meds: . atorvastatin  20 mg Oral QHS  . carvedilol  12.5 mg Oral BID  . Chlorhexidine Gluconate Cloth  6 each Topical  Q0600  . Chlorhexidine Gluconate Cloth  6 each Topical Q0600  . darbepoetin (ARANESP) injection - DIALYSIS  40 mcg Intravenous Q Fri-HD  . mupirocin ointment  1 application Nasal BID  . pantoprazole  40 mg Oral Daily  . saccharomyces boulardii  250 mg Oral QHS   Continuous Infusions: . heparin 2,200 Units/hr (08/12/19 0981)  . piperacillin-tazobactam (ZOSYN)  IV 2.25 g (08/12/19 0510)  . vancomycin 750 mg (08/10/19 2134)     LOS: 6 days   Time Spent in minutes   30 minutes  Jairon Ripberger D.O. on 08/12/2019 at 10:29 AM  Between 7am to 7pm - Please see pager noted on [email or web address]  After 7pm go to [email or web address]  And look for the night coverage person covering for me after hours  [facility] Office  [phone]

## 2019-08-12 NOTE — Progress Notes (Signed)
ANTICOAGULATION CONSULT NOTE - [place] for Heparin Indication: h/o DVT  Allergies  Allergen Reactions  . Crestor [Rosuvastatin Calcium] Other (See Comments)    Leg pain    Patient Measurements: Height: 5\' 9"  (175.3 cm) Weight: 183 lb 10.3 oz (83.3 kg) IBW/kg (Calculated) : 66.2 Heparin dosing weight: 82 kg  Vital Signs: Temp: 98.1 F (36.7 C) (11/15 1201) Temp Source: Oral (11/15 1201) BP: 119/53 (11/15 1201) Pulse Rate: 89 (11/15 1201)  Labs: Recent Labs    08/10/19 0223  08/11/19 0258  08/11/19 2129 08/12/19 0714 08/12/19 1356  HGB 9.8*  --  9.9*  --   --  9.5*  --   HCT 31.1*  --  31.2*  --   --  29.7*  --   PLT 708*  --  616*  --   --  525*  --   HEPARINUNFRC 0.18*   < > 0.16*   < > 0.20* 0.50 0.62  CREATININE 3.79*  --  3.07*  --   --  4.66*  --    < > = values in this interval not displayed.    Estimated Creatinine Clearance: 14.1 mL/min (A) (by C-G formula based on SCr of 4.66 mg/dL (H)).   Medical History: Past Medical History:  Diagnosis Date  . Anemia   . Arthritis    "hands, knees" (10/10/2017)  . Asthma   . CHF (congestive heart failure) ([place])   . Coronary artery disease   . ESRD on hemodialysis [facility])    Dialysis T/ Th/ Sat  . H/O Clostridium difficile infection   . High cholesterol   . History of kidney stones   . Hypertension   . PVD (peripheral vascular disease) ([place])    "LLE; will have OR" (10/10/2017)  . Sleep apnea    "never given mask" (10/10/2017)  . Stroke [facility])    mild stroke mid April - 2019  . Type II diabetes mellitus ([place])    "no RX anymore" (10/10/2017)    Assessment: 63 y.o. female with h/o DVT, Eliquis on hold for possible amputation d/t cellulitis/osteomyelitis on 11/17. Pharmacy asked to dose IV heparin.  Confirmatory heparin level therapeutic x2 while on drip rate 2200 units/hr. CBC stable. No overt bleeding or infusion issues noted.   Goal of Therapy: Heparin level 0.3-0.7  units/ml Monitor platelets by anticoagulation protocol: Yes   Plan:  -Continue heparin drip at 2200 units/hr -Daily heparin level and cbc -Monitor for bleeding  Richardine Service, PharmD PGY1 Pharmacy Resident Phone: [phone] 08/12/2019  2:49 PM  Please check [email or web address] for unit-specific pharmacy phone numbers.

## 2019-08-12 NOTE — Progress Notes (Signed)
ANTICOAGULATION CONSULT NOTE - [place] for Heparin Indication: h/o DVT  Allergies  Allergen Reactions  . Crestor [Rosuvastatin Calcium] Other (See Comments)    Leg pain    Patient Measurements: Height: 5\' 9"  (175.3 cm) Weight: 183 lb 10.3 oz (83.3 kg) IBW/kg (Calculated) : 66.2 Heparin dosing weight: 82 kg  Vital Signs: Temp: 98.9 F (37.2 C) (11/15 0415) Temp Source: Oral (11/15 0415) BP: 136/53 (11/15 0414) Pulse Rate: 93 (11/15 0643)  Labs: Recent Labs    08/10/19 0223  08/11/19 0258 08/11/19 1147 08/11/19 2129 08/12/19 0714  HGB 9.8*  --  9.9*  --   --  9.5*  HCT 31.1*  --  31.2*  --   --  29.7*  PLT 708*  --  616*  --   --  525*  HEPARINUNFRC 0.18*   < > 0.16* 0.22* 0.20* 0.50  CREATININE 3.79*  --  3.07*  --   --  4.66*   < > = values in this interval not displayed.    Estimated Creatinine Clearance: 14.1 mL/min (A) (by C-G formula based on SCr of 4.66 mg/dL (H)).   Medical History: Past Medical History:  Diagnosis Date  . Anemia   . Arthritis    "hands, knees" (10/10/2017)  . Asthma   . CHF (congestive heart failure) ([place])   . Coronary artery disease   . ESRD on hemodialysis [facility])    Dialysis T/ Th/ Sat  . H/O Clostridium difficile infection   . High cholesterol   . History of kidney stones   . Hypertension   . PVD (peripheral vascular disease) ([place])    "LLE; will have OR" (10/10/2017)  . Sleep apnea    "never given mask" (10/10/2017)  . Stroke [facility])    mild stroke mid April - 2019  . Type II diabetes mellitus ([place])    "no RX anymore" (10/10/2017)    Assessment: 63 y.o. female with h/o DVT, Eliquis on hold for possible amputation d/t cellulitis/osteomyelitis on 11/17. Pharmacy asked to dose IV heparin.  Heparin level therapeutic after increasing drip rate to 2200 units/hr. CBC stable. No overt bleeding or infusion issues noted.   Goal of Therapy: Heparin level 0.3-0.7 units/ml Monitor platelets by  anticoagulation protocol: Yes   Plan:  -Continue heparin drip at 2200 units/hr -Recheck confirmatory heparin level in 8hr -Daily heparin level and cbc -Monitor for bleeding  Richardine Service, PharmD PGY1 Pharmacy Resident Phone: [phone] 08/12/2019  8:03 AM  Please check [email or web address] for unit-specific pharmacy phone numbers.

## 2019-08-12 NOTE — Progress Notes (Addendum)
Subjective:  sleeping awoken , no new cos, HD  For tomor  on schedule MWF  Objective Vital signs in last 24 hours: Vitals:   08/12/19 0643 08/12/19 0825 08/12/19 0932 08/12/19 1201  BP:  (!) 104/50 (!) 116/48 (!) 119/53  Pulse: 93 92 97 89  Resp:  16  18  Temp:  98.2 F (36.8 C)  98.1 F (36.7 C)  TempSrc:  Oral  Oral  SpO2: 95% 98%  99%  Weight:      Height:       Weight change: 1.3 kg  PPhysical Exam: General- elderly Female nad,alert,  Chest-clear bilat Card-RRR no MRG Abd- Obese, Bs +,soft nt nd no mass or ascites  Extrem-R BKA,no pretibedema, L foot bandaged  Clean / dry/ Malodorous HD Access-R TDC400 bfr on HD ,, LUA AVF+bruit (not using)  Home meds: - amlodipine 5/ carvedilol 25 bid/ hydralazine 25 bid - apixaban 2.5 bid - atorvastatin 20 hs/ ticagrelor 90 bid - pantoprazole 40 hs/ calc acetate ac tid - prn's/ vitamins/ supplements  Outpt HD:MWF [facility]  4h 350/800 80kg 2/2.25 bath TDC Heparin 2400 - LUE AVF (poorly matured, not usingUNTIL LOWER EXTREM Infect/ PAD issue stable ) - calc 0.25 tiw  Problem/Plan: 1. GangreneLeftFoot.- On Zosyn, Vanco/For revasc procedure/ surgery per [facility] on 11/17. 2. ESRD - MWF HD. Pt missed one week HD, living alone since dtr moved out and doesn't have reliable transportation due to [place] dependence. Consulting renal SW. HD tomorrow.  3. Hypokalemia = k 2.8 ( 11/14) >3.6 today  11/14 given po supplement  Noted also on Carb mod diet   Fu k labs  ^ k bath next hd 4. HTN/ vol -up 2kg,( BED WT)BP's soft, have  lowered most  BP meds 5. Anemia ckd - Hb 11 >10.6>9.8>9.9>9.5   start Aranesp 40  Hd 11/13 / HO TFS 15% Feritin 208 =07/25/19 Op labs hold iron with infection /gangr issue 6. Secondary Hyperparathyroidism - po vit d On Hd Corec Ca 9.4 Phos  2.5  On Ca acetate 1 ac will dc with phos trend down  7. H/o CVA\ 8. Chron anticoag - was on eliquis,ho dvt Per op [facility] history    Ernest Haber, PA-C [place] [phone] 08/12/2019,12:19 PM  LOS: 6 days   Pt seen, examined and agree w A/P as above.  [name]  MD 08/12/2019, 12:34 PM    Labs: Basic Metabolic Panel: Recent Labs  Lab 08/09/19 0231 08/10/19 0223 08/11/19 0258 08/12/19 0714  NA 134* 129* 131* 132*  K 3.7 3.5 2.8* 3.6  CL 95* 90* 92* 93*  CO2 22 21* 23 22  GLUCOSE 130* 114* 143* 185*  BUN 17 32* 17 34*  CREATININE 2.68* 3.79* 3.07* 4.66*  CALCIUM 8.1* 8.0* 8.2* 8.4*  PHOS 2.8 3.7 2.5  --    Liver Function Tests: Recent Labs  Lab 08/06/19 2235 08/09/19 0231 08/10/19 0223 08/11/19 0258  AST 10*  --   --   --   ALT 7  --   --   --   ALKPHOS 162*  --   --   --   BILITOT 0.9  --   --   --   PROT 8.0  --   --   --   ALBUMIN 2.9* 2.5* 2.3* 2.2*   No results for input(s): LIPASE, AMYLASE in the last 168 hours. No results for input(s): AMMONIA in the last 168 hours. CBC: Recent Labs  Lab 08/06/19 2058  08/08/19 0225 08/09/19 0231 08/10/19 0223 08/11/19 0258 08/12/19 0714  WBC 27.3*   < > 26.5* 29.1* 27.4* 25.1* 26.2*  NEUTROABS 23.4*  --   --   --   --   --   --   HGB 12.0   < > 11.0* 10.6* 9.8* 9.9* 9.5*  HCT 37.0   < > 33.1* 32.8* 31.1* 31.2* 29.7*  MCV 94.9   < > 91.2 93.7 94.8 93.7 94.9  PLT 965*   < > 822* 793* 708* 616* 525*   < > = values in this interval not displayed.   Cardiac Enzymes: No results for input(s): CKTOTAL, CKMB, CKMBINDEX, TROPONINI in the last 168 hours. CBG: Recent Labs  Lab 08/11/19 2019 08/12/19 0037 08/12/19 0412 08/12/19 0839 08/12/19 1158  GLUCAP 221* 171* 160* 158* 138*    Studies/Results: No results found. Medications: . heparin 2,200 Units/hr (08/12/19 4734)  . piperacillin-tazobactam (ZOSYN)  IV 2.25 g (08/12/19 0510)  . vancomycin 750 mg (08/10/19 2134)   . atorvastatin  20 mg Oral QHS  . carvedilol  12.5 mg Oral BID  . Chlorhexidine Gluconate Cloth  6 each Topical Q0600  . Chlorhexidine  Gluconate Cloth  6 each Topical Q0600  . darbepoetin (ARANESP) injection - DIALYSIS  40 mcg Intravenous Q Fri-HD  . mupirocin ointment  1 application Nasal BID  . pantoprazole  40 mg Oral Daily  . saccharomyces boulardii  250 mg Oral QHS

## 2019-08-13 LAB — GLUCOSE, CAPILLARY
Glucose-Capillary: 135 mg/dL — ABNORMAL HIGH (ref 70–99)
Glucose-Capillary: 137 mg/dL — ABNORMAL HIGH (ref 70–99)
Glucose-Capillary: 151 mg/dL — ABNORMAL HIGH (ref 70–99)
Glucose-Capillary: 155 mg/dL — ABNORMAL HIGH (ref 70–99)
Glucose-Capillary: 171 mg/dL — ABNORMAL HIGH (ref 70–99)

## 2019-08-13 LAB — RENAL FUNCTION PANEL
Albumin: 2.1 g/dL — ABNORMAL LOW (ref 3.5–5.0)
Anion gap: 16 — ABNORMAL HIGH (ref 5–15)
BUN: 43 mg/dL — ABNORMAL HIGH (ref 8–23)
CO2: 21 mmol/L — ABNORMAL LOW (ref 22–32)
Calcium: 8.5 mg/dL — ABNORMAL LOW (ref 8.9–10.3)
Chloride: 94 mmol/L — ABNORMAL LOW (ref 98–111)
Creatinine, Ser: 5.05 mg/dL — ABNORMAL HIGH (ref 0.44–1.00)
GFR calc Af Amer: 10 mL/min — ABNORMAL LOW (ref >60)
GFR calc non Af Amer: 8 mL/min — ABNORMAL LOW (ref >60)
Glucose, Bld: 152 mg/dL — ABNORMAL HIGH (ref 70–99)
Phosphorus: 3 mg/dL (ref 2.5–4.6)
Potassium: 3.8 mmol/L (ref 3.5–5.1)
Sodium: 131 mmol/L — ABNORMAL LOW (ref 135–145)

## 2019-08-13 LAB — CBC
HCT: 29.8 % — ABNORMAL LOW (ref 36.0–46.0)
Hemoglobin: 9.5 g/dL — ABNORMAL LOW (ref 12.0–15.0)
MCH: 30.4 pg (ref 26.0–34.0)
MCHC: 31.9 g/dL (ref 30.0–36.0)
MCV: 95.5 fL (ref 80.0–100.0)
Platelets: 549 10*3/uL — ABNORMAL HIGH (ref 150–400)
RBC: 3.12 MIL/uL — ABNORMAL LOW (ref 3.87–5.11)
RDW: 16.5 % — ABNORMAL HIGH (ref 11.5–15.5)
WBC: 25.9 10*3/uL — ABNORMAL HIGH (ref 4.0–10.5)
nRBC: 0 % (ref 0.0–0.2)

## 2019-08-13 LAB — SURGICAL PCR SCREEN
MRSA, PCR: POSITIVE — AB
Staphylococcus aureus: POSITIVE — AB

## 2019-08-13 LAB — HEPARIN LEVEL (UNFRACTIONATED): Heparin Unfractionated: 0.52 IU/mL (ref 0.30–0.70)

## 2019-08-13 MED ORDER — DARBEPOETIN ALFA 150 MCG/0.3ML IJ SOSY
PREFILLED_SYRINGE | INTRAMUSCULAR | Status: AC
  Filled 2019-08-13: qty 0.3

## 2019-08-13 MED ORDER — MUPIROCIN 2 % EX OINT
TOPICAL_OINTMENT | Freq: Two times a day (BID) | CUTANEOUS | Status: DC
  Administered 2019-08-13 – 2019-08-14 (×2): via NASAL
  Administered 2019-08-15: 1 via NASAL
  Administered 2019-08-15 – 2019-08-16 (×2): via NASAL
  Administered 2019-08-16: 1 via NASAL
  Administered 2019-08-17: 22:00:00 via NASAL
  Administered 2019-08-17: 1 via NASAL
  Administered 2019-08-18 – 2019-08-19 (×4): via NASAL
  Administered 2019-08-20: 1 via NASAL
  Administered 2019-08-20 – 2019-08-22 (×4): via NASAL
  Administered 2019-08-22: 1 via NASAL
  Administered 2019-08-23 (×2): via NASAL
  Filled 2019-08-13 (×2): qty 22

## 2019-08-13 MED ORDER — HEPARIN SODIUM (PORCINE) 1000 UNIT/ML DIALYSIS
2400.0000 [IU] | Freq: Once | INTRAMUSCULAR | Status: AC
  Administered 2019-08-13: 14:00:00 3000 [IU] via INTRAVENOUS_CENTRAL
  Filled 2019-08-13: qty 3

## 2019-08-13 MED ORDER — HEPARIN SODIUM (PORCINE) 1000 UNIT/ML IJ SOLN
INTRAMUSCULAR | Status: AC
  Administered 2019-08-13: 3000 [IU] via INTRAVENOUS_CENTRAL
  Filled 2019-08-13: qty 7

## 2019-08-13 MED ORDER — CHLORHEXIDINE GLUCONATE CLOTH 2 % EX PADS
6.0000 | MEDICATED_PAD | Freq: Every day | CUTANEOUS | Status: AC
  Administered 2019-08-13 – 2019-08-20 (×5): 6 via TOPICAL

## 2019-08-13 MED ORDER — VANCOMYCIN HCL IN DEXTROSE 750-5 MG/150ML-% IV SOLN
INTRAVENOUS | Status: AC
  Administered 2019-08-13: 750 mg via INTRAVENOUS
  Filled 2019-08-13: qty 150

## 2019-08-13 NOTE — NC FL2 (Signed)
[place] MEDICAID FL2 LEVEL OF CARE SCREENING TOOL     IDENTIFICATION  Patient Name: Kristen Berry Birthdate: 03/22/1956 Sex: female Admission Date (Current Location): 08/06/2019  [place] and [place] Number:  Oval Linsey [id number] [place] and Address:  [facility] [place], [place] [address] [place], [place] [address]      Provider Number: [id number]  Attending Physician Name and Address:  Cristal Ford, DO  Relative Name and Phone Number:  Aaron Mose, Son, Phone number on file not in service    Current Level of Care: Hospital Recommended Level of Care: [place] Prior Approval Number:    Date Approved/Denied:   PASRR Number: [id number]  Discharge Plan: SNF    Current Diagnoses: Patient Active Problem List   Diagnosis Date Noted  . Gangrene of left foot ([place])   . Sepsis ([place]) 08/06/2019  . MRSA (methicillin resistant Staphylococcus aureus) 06/26/2019  . Cellulitis of left foot 06/25/2019  . Anemia in chronic kidney disease 01/29/2019  . Complete traumatic amputation at level between knee and ankle, unspecified lower leg, initial encounter ([place]) 01/12/2019  . Below knee amputation ([place])   . S/P BKA (below knee amputation), right ([place]) 01/05/2019  . ESRD on hemodialysis ([place])   . Coronary artery disease without angina pectoris   . Cellulitis 01/02/2019  . Gangrene of right foot ([place])   . Right foot infection 12/21/2018  . Unspecified open wound of right great toe without damage to nail, initial encounter 12/14/2018  . Diarrhea, unspecified 05/31/2018  . PAD (peripheral artery disease) ([place]) 01/24/2018  . Peripheral artery disease ([place]) 01/24/2018  . Anticoagulated 01/13/2018  . Rib pain 01/13/2018  . Transient cerebral ischemic attack, unspecified 01/13/2018  . C. difficile diarrhea 01/06/2018  . Hypokalemia 01/06/2018  . ESRD (end stage renal disease) ([place]) 01/06/2018  . Prolonged QT interval 01/06/2018  . Hypotension  01/06/2018  . Multiple rib fractures 01/06/2018  . Encounter for immunization 11/22/2017  . Iron deficiency anemia, unspecified 11/21/2017  . Coagulation defect, unspecified ([place]) 11/17/2017  . 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 Hypoglycemia, unspecified 11/17/2017  . Pain, unspecified 11/17/2017  . Personal history of other venous thrombosis and embolism 11/17/2017  . Presence of other vascular implants and grafts 11/17/2017  . Pruritus, unspecified 11/17/2017  . Secondary hyperparathyroidism of renal origin ([place]) 11/17/2017  . Type 2 diabetes mellitus with diabetic neuropathy, unspecified ([place]) 11/17/2017  . Pressure injury of skin 11/08/2017  . Coronary artery disease due to lipid rich plaque   . Chest pain   . Acute on chronic systolic CHF (congestive heart failure), NYHA class 3 ([place]) 10/18/2017  . PICC (peripherally inserted central catheter) in place   . Acute osteomyelitis of toe of left foot ([place])   . Essential hypertension 12/14/2016  . Abnormal bone xray 06/15/2016  . MGUS (monoclonal gammopathy of unknown significance) 05/18/2016  . Stage 4 chronic kidney disease ([place]) 05/18/2016  . CHF (congestive heart failure) ([place]) 05/18/2016  . Renal insufficiency 06/28/2013  . Other and unspecified hyperlipidemia 06/28/2013  . Obesity, unspecified 06/28/2013  . Pain in limb 06/28/2013  . Diabetes mellitus type 2, insulin dependent ([place]) 06/28/2013    Orientation RESPIRATION BLADDER Height & Weight     Self, Time, Situation, Place  Normal Continent Weight: 189 lb 2.5 oz (85.8 kg) Height:  5\' 9"  (175.3 cm)  BEHAVIORAL SYMPTOMS/MOOD NEUROLOGICAL BOWEL NUTRITION  STATUS      Continent Diet(Heart Healthy/ Carb modified diet, thin liquids)  AMBULATORY STATUS COMMUNICATION OF NEEDS Skin   Limited Assist Verbally Bruising, Skin abrasions, Other  (Comment)(celluilitis on left foot, bruising on right/left leg, MASD on abdomen/breast/buttocks/groin; Will be getting toe amputation on 11/17, pressure ulcer (dressing in place Change every 3 days))                       Personal Care Assistance Level of Assistance  Feeding, Bathing, Dressing, Total care Bathing Assistance: Limited assistance Feeding assistance: Independent Dressing Assistance: Limited assistance Total Care Assistance: Limited assistance   Functional Limitations Info  Sight, Hearing, Speech Sight Info: Impaired Hearing Info: Impaired Speech Info: Adequate    SPECIAL CARE FACTORS FREQUENCY  PT (By licensed PT)     PT Frequency: 5x/wk              Contractures Contractures Info: Not present    Additional Factors Info  Code Status, Allergies Code Status Info: Full Code Allergies Info: Crestor (Rosuvastatin Calcium)           Current Medications (08/13/2019):  This is the current hospital active medication list Current Facility-Administered Medications  Medication Dose Route Frequency Provider Last Rate Last Dose  . acetaminophen (TYLENOL) tablet 650 mg  650 mg Oral Q6H PRN Rise Patience, MD   650 mg at 08/07/19 1513   Or  . acetaminophen (TYLENOL) suppository 650 mg  650 mg Rectal Q6H PRN Rise Patience, MD      . atorvastatin (LIPITOR) tablet 20 mg  20 mg Oral QHS Rise Patience, MD   20 mg at 08/12/19 2213  . carvedilol (COREG) tablet 12.5 mg  12.5 mg Oral BID Roney Jaffe, MD   Stopped at 08/13/19 1007  . Chlorhexidine Gluconate Cloth 2 % PADS 6 each  6 each Topical Q0600 Roney Jaffe, MD   6 each at 08/13/19 0454  . Chlorhexidine Gluconate Cloth 2 % PADS 6 each  6 each Topical Q0600 Cristal Ford, DO      . Darbepoetin Alfa (ARANESP) 150 MCG/0.3ML injection           . Darbepoetin Alfa (ARANESP) injection 40 mcg  40 mcg Intravenous Q Fri-HD Ernest Haber, PA-C   40 mcg at 08/13/19 1420  . heparin ADULT infusion 100  units/mL (25000 units/223mL sodium chloride 0.45%)  2,200 Units/hr Intravenous Continuous Einar Grad, RPH 22 mL/hr at 08/13/19 0606 2,200 Units/hr at 08/13/19 0606  . hydrOXYzine (ATARAX/VISTARIL) tablet 10 mg  10 mg Oral Q6H PRN Hongalgi, Lenis Dickinson, MD      . mupirocin ointment (BACTROBAN) 2 %   Nasal BID Cristal Ford, DO      . oxyCODONE (Oxy IR/ROXICODONE) immediate release tablet 10 mg  10 mg Oral Q4H PRN Serafina Mitchell, MD   10 mg at 08/13/19 1201  . pantoprazole (PROTONIX) EC tablet 40 mg  40 mg Oral Daily Modena Jansky, MD   40 mg at 08/13/19 1007  . piperacillin-tazobactam (ZOSYN) IVPB 2.25 g  2.25 g Intravenous Q8H Romona Curls, RPH 100 mL/hr at 08/13/19 0600 2.25 g at 08/13/19 0600  . saccharomyces boulardii (FLORASTOR) capsule 250 mg  250 mg Oral QHS Rise Patience, MD   250 mg at 08/12/19 2213  . Vancomycin (VANCOCIN) 750-5 MG/150ML-% IVPB           . vancomycin (VANCOCIN) IVPB 750 mg/150 ml premix  750 mg Intravenous Q  M,W,F-HD Rise Patience, MD 150 mL/hr at 08/10/19 2134 750 mg at 08/10/19 2134     Discharge Medications: Please see discharge summary for a list of discharge medications.  Relevant Imaging Results:  Relevant Lab Results:   Additional Information SSN: [id number]; COVID negative on 08/07/2019; HD M, W, F, in [place], [employer]

## 2019-08-13 NOTE — Progress Notes (Signed)
Patient back from dialysis. Alert and oriented x 4; complaining of pain on left foot (10/10). VS stable. Will continue to monitor.

## 2019-08-13 NOTE — Progress Notes (Signed)
ANTICOAGULATION CONSULT NOTE - Follow Up Consult  Pharmacy Consult for Heparin + Vanco/Zosyn Indication: h/o DVT + dry gangrene/osteo of toe  Allergies  Allergen Reactions  . Crestor [Rosuvastatin Calcium] Other (See Comments)    Leg pain    Patient Measurements: Height: 5\' 9"  (175.3 cm) Weight: 189 lb 2.5 oz (85.8 kg) IBW/kg (Calculated) : 66.2 Heparin Dosing Weight:   Vital Signs: Temp: 97.9 F (36.6 C) (11/16 0758) Temp Source: Oral (11/16 0758) BP: 131/50 (11/16 0758) Pulse Rate: 75 (11/16 0800)  Labs: Recent Labs    08/11/19 0258  08/12/19 0714 08/12/19 1356 08/13/19 0237  HGB 9.9*  --  9.5*  --  9.5*  HCT 31.2*  --  29.7*  --  29.8*  PLT 616*  --  525*  --  549*  HEPARINUNFRC 0.16*   < > 0.50 0.62 0.52  CREATININE 3.07*  --  4.66*  --  5.05*   < > = values in this interval not displayed.    Estimated Creatinine Clearance: 13.1 mL/min (A) (by C-G formula based on SCr of 5.05 mg/dL (H)).   Assessment:  Anticoag: apix PTA for hx DVT on hold for procedure (LD 11/8 PM) >> heparin IV, HL 0.52 in goal. CBC stable.  ID: Cellulitis/Osteo L great toe with dry gangrene - afeb, WBC 25.9, LA 1.3>0.9  11/10 vanc>> 11/9 zosyn>>  1/9 BCx - negF 11/9 covid - neg 11/10 MRSA PCR: pos 11/9: COVID negative  Goal of Therapy:  Heparin level 0.3-0.7 units/ml Monitor platelets by anticoagulation protocol: Yes  Vanco peak >20 post-HD   Plan:  Continue heparin at 2200 units/hr. Stop on call to OR 11/17. Daily HL, CBC, monitor bleeding  Zosyn to 2.25g IV q8h Vancomycin 750mg  IV qHD MWF (1x 11/10)  Left femoral popliteal bypass with vein and left great toe amputation tomorrow.   Savaughn Karwowski S. Alford [place]land, PharmD, BCPS Clinical Staff Pharmacist [phone] Eilene Ghazi Stillinger 08/13/2019,8:43 AM

## 2019-08-13 NOTE — Progress Notes (Signed)
PROGRESS NOTE    Kristen Berry  JAS:[id number] DOB: [birth year] DOA: 08/06/2019 PCP: [place], Sarah T, MD   Brief Narrative:  HPI On 11/10/202 by Dr. Gean Birchwood Kristen Berry is a 63 y.o. female with history of ESRD on hemodialysis on Monday Wednesday Friday, peripheral vascular disease status post stenting, history of DVT, anemia presents to the ER after patient has been noticing increasing swelling of the left foot increasing discharge and gangrene of the great toe noticed over the last 2 weeks.  Patient was admitted last month for cellulitis involving the left foot and great toe.  Patient states over the last 1 week patient missed 3 sessions of her dialysis due to transport issues.  Patient is mildly short of breath but not in distress.  Has noticed increasing swelling of the left foot with increasing discharge mostly from the left great toe which has become gangrenous of the last 2 weeks.   Interim history  Admitted with sepsis secondary to ischemia of the left foot/gangrene.  Orthopedics and vascular surgery consulted and appreciated.  Patient also ESRD patient, nephrology consulted. Assessment & Plan   Sepsis secondary to diabetic insensate neuropathy with critical limb ischemia of the left foot/great toe with cellulitis, osteomyelitis, gangrene -On admission, patient presented with leukocytosis, tachycardia, tachypnea -Orthopedic surgery consulted and appreciated.  He discussed options of possible revascularization versus trans to be amputation.  Patient declined amputation at this time. -Vascular surgery consulted and appreciated, plans for left femoral to BK popliteal artery bypass on 08/14/2019. -Currently on IV vancomycin and Zosyn  ESRD -Patient dialyzes Monday, Wednesday, Friday -Patient missed 1 week of hemodialysis due to living alone and her daughter moved out.  She had lack of reliable transportation due to wheelchair dependence. -Social work consulted -Nephrology  consulted and appreciated  Hypokalemia -Potassium currently 3.8 today -magnesium 1.9  Essential hypertension -BP controlled  -Continue Coreg and hydralazine.  Amlodipine currently held -Continue volume management with hemodialysis  Anemia of chronic disease -Hemoglobin 9.5 today, continue to monitor CBC  History of DVT -Eliquis currently held due to possible procedure -Continue IV heparin  Diabetes mellitus, type II -Diet controlled, continue to monitor CBGs  Thrombocytosis -Likely reactive to sepsis, continue to monitor CBC -Platelet number trending downward, currently 549 (was 965) -Oncology consulted and appreciated, discussed with Dr. Alvy Bimler, felt that this is likely due to sepsis.  Patient can follow-up in the outpatient setting.  Hypokalemia -Resolved, continue to monitor BMP  History of CVA -currently on heparin, statin  Status post right BKA  DVT Prophylaxis Heparin  Code Status: Full  Family Communication: None at bedside  Disposition Plan: Admitted.  Pending surgery on 08/14/2019  Consultants Orthopedic surgery Nephrology Vascular surgery  Procedures  None  Antibiotics   Anti-infectives (From admission, onward)   Start     Dose/Rate Route Frequency Ordered Stop   08/10/19 0922  Vancomycin (VANCOCIN) 750-5 MG/150ML-% IVPB    Note to Pharmacy: Murriel Hopper   : cabinet override      08/10/19 0922 08/10/19 2129   08/08/19 1914  Vancomycin (VANCOCIN) 750-5 MG/150ML-% IVPB    Note to Pharmacy: Cherylann Banas   : cabinet override      08/08/19 1914 08/08/19 2111   08/08/19 1200  vancomycin (VANCOCIN) IVPB 750 mg/150 ml premix     750 mg 150 mL/hr over 60 Minutes Intravenous Every M-W-F (Hemodialysis) 08/06/19 2229     08/07/19 1930  vancomycin (VANCOCIN) IVPB 750 mg/150 ml premix  750 mg 150 mL/hr over 60 Minutes Intravenous  Once 08/07/19 1844 08/07/19 2135   08/07/19 0830  piperacillin-tazobactam (ZOSYN) IVPB 2.25 g     2.25 g 100 mL/hr  over 30 Minutes Intravenous Every 8 hours 08/07/19 0812     08/07/19 0800  piperacillin-tazobactam (ZOSYN) IVPB 3.375 g  Status:  Discontinued     3.375 g 12.5 mL/hr over 240 Minutes Intravenous Every 12 hours 08/07/19 0303 08/07/19 0812   08/06/19 2230  piperacillin-tazobactam (ZOSYN) IVPB 3.375 g     3.375 g 100 mL/hr over 30 Minutes Intravenous  Once 08/06/19 2222 08/06/19 2342   08/06/19 2230  vancomycin (VANCOCIN) 1,750 mg in sodium chloride 0.9 % 500 mL IVPB     1,750 mg 250 mL/hr over 120 Minutes Intravenous  Once 08/06/19 2226 08/07/19 0429      Subjective:   [place] seen and examined today.  Patient with no new complaints today.  Denies chest pain, shortness of breath, bowel pain, nausea or vomiting, diarrhea or constipation. Objective:   Vitals:   08/13/19 0423 08/13/19 0500 08/13/19 0758 08/13/19 0800  BP:   (!) 131/50   Pulse:   76 75  Resp:   (!) 40 13  Temp: 98.7 F (37.1 C)  97.9 F (36.6 C)   TempSrc: Oral  Oral   SpO2:   100% 97%  Weight:  85.8 kg    Height:        Intake/Output Summary (Last 24 hours) at 08/13/2019 1134 Last data filed at 08/13/2019 0950 Gross per 24 hour  Intake 895.33 ml  Output -  Net 895.33 ml   Filed Weights   08/10/19 1224 08/12/19 0603 08/13/19 0500  Weight: 81.7 kg 83.3 kg 85.8 kg   Exam  General: Well developed, well nourished, NAD, appears stated age  31: NCAT, mucous membranes moist.   Cardiovascular: S1 S2 auscultated, RRR, no murmur  Respiratory: Clear to auscultation bilaterally with equal chest rise  Abdomen: Soft, nontender, nondistended, + bowel sounds  Extremities: Right BKA, left great toe gangrene  Neuro: AAOx3, nonfocal  Psych: Appropriate mood and affect   Data Reviewed: I have personally reviewed following labs and imaging studies  CBC: Recent Labs  Lab 08/06/19 2058  08/09/19 0231 08/10/19 0223 08/11/19 0258 08/12/19 0714 08/13/19 0237  WBC 27.3*   < > 29.1* 27.4* 25.1* 26.2*  25.9*  NEUTROABS 23.4*  --   --   --   --   --   --   HGB 12.0   < > 10.6* 9.8* 9.9* 9.5* 9.5*  HCT 37.0   < > 32.8* 31.1* 31.2* 29.7* 29.8*  MCV 94.9   < > 93.7 94.8 93.7 94.9 95.5  PLT 965*   < > 793* 708* 616* 525* 549*   < > = values in this interval not displayed.   Basic Metabolic Panel: Recent Labs  Lab 08/06/19 2058  08/09/19 0231 08/10/19 0223 08/11/19 0258 08/12/19 0714 08/13/19 0237  NA 133*   < > 134* 129* 131* 132* 131*  K 3.3*   < > 3.7 3.5 2.8* 3.6 3.8  CL 95*   < > 95* 90* 92* 93* 94*  CO2 10*   < > 22 21* 23 22 21*  GLUCOSE 107*   < > 130* 114* 143* 185* 152*  BUN 102*   < > 17 32* 17 34* 43*  CREATININE 5.48*   < > 2.68* 3.79* 3.07* 4.66* 5.05*  CALCIUM 7.5*   < >  8.1* 8.0* 8.2* 8.4* 8.5*  MG 2.3  --   --   --  1.9  --   --   PHOS  --   --  2.8 3.7 2.5  --  3.0   < > = values in this interval not displayed.   GFR: Estimated Creatinine Clearance: 13.1 mL/min (A) (by C-G formula based on SCr of 5.05 mg/dL (H)). Liver Function Tests: Recent Labs  Lab 08/06/19 2235 08/09/19 0231 08/10/19 0223 08/11/19 0258 08/13/19 0237  AST 10*  --   --   --   --   ALT 7  --   --   --   --   ALKPHOS 162*  --   --   --   --   BILITOT 0.9  --   --   --   --   PROT 8.0  --   --   --   --   ALBUMIN 2.9* 2.5* 2.3* 2.2* 2.1*   No results for input(s): LIPASE, AMYLASE in the last 168 hours. No results for input(s): AMMONIA in the last 168 hours. Coagulation Profile: Recent Labs  Lab 08/07/19 0529  INR 1.6*   Cardiac Enzymes: No results for input(s): CKTOTAL, CKMB, CKMBINDEX, TROPONINI in the last 168 hours. BNP (last 3 results) No results for input(s): PROBNP in the last 8760 hours. HbA1C: No results for input(s): HGBA1C in the last 72 hours. CBG: Recent Labs  Lab 08/12/19 1659 08/12/19 2034 08/13/19 0010 08/13/19 0424 08/13/19 0757  GLUCAP 173* 243* 151* 155* 137*   Lipid Profile: No results for input(s): CHOL, HDL, LDLCALC, TRIG, CHOLHDL, LDLDIRECT in  the last 72 hours. Thyroid Function Tests: No results for input(s): TSH, T4TOTAL, FREET4, T3FREE, THYROIDAB in the last 72 hours. Anemia Panel: No results for input(s): VITAMINB12, FOLATE, FERRITIN, TIBC, IRON, RETICCTPCT in the last 72 hours. Urine analysis:    Component Value Date/Time   COLORURINE YELLOW 10/17/2017 1402   APPEARANCEUR HAZY (A) 10/17/2017 1402   LABSPEC 1.013 10/17/2017 1402   PHURINE 5.0 10/17/2017 1402   GLUCOSEU NEGATIVE 10/17/2017 1402   HGBUR NEGATIVE 10/17/2017 1402   BILIRUBINUR NEGATIVE 10/17/2017 1402   KETONESUR 5 (A) 10/17/2017 1402   PROTEINUR NEGATIVE 10/17/2017 1402   NITRITE NEGATIVE 10/17/2017 1402   LEUKOCYTESUR NEGATIVE 10/17/2017 1402   Sepsis Labs: @LABRCNTIP (procalcitonin:4,lacticidven:4)  ) Recent Results (from the past 240 hour(s))  Blood culture (routine x 2)     Status: None   Collection Time: 08/06/19  8:24 PM   Specimen: BLOOD  Result Value Ref Range Status   Specimen Description BLOOD RIGHT ANTECUBITAL  Final   Special Requests   Final    BOTTLES DRAWN AEROBIC AND ANAEROBIC Blood Culture adequate volume   Culture   Final    NO GROWTH 5 DAYS Performed at [facility], [place] [address], [place], [place] [address]    Report Status 08/11/2019 FINAL  Final  Blood culture (routine x 2)     Status: None   Collection Time: 08/06/19  9:44 PM   Specimen: BLOOD RIGHT HAND  Result Value Ref Range Status   Specimen Description BLOOD RIGHT HAND  Final   Special Requests   Final    BOTTLES DRAWN AEROBIC ONLY Blood Culture results may not be optimal due to an inadequate volume of blood received in culture bottles   Culture   Final    NO GROWTH 5 DAYS Performed at [facility], [place],  [place] [address]    Report Status 08/11/2019 FINAL  Final  SARS CORONAVIRUS 2 (TAT 6-24 HRS) Nasopharyngeal Nasopharyngeal Swab     Status: None   Collection Time: 08/06/19 11:05 PM   Specimen: Nasopharyngeal Swab  Result  Value Ref Range Status   SARS Coronavirus 2 NEGATIVE NEGATIVE Final    Comment: (NOTE) SARS-CoV-2 target nucleic acids are NOT DETECTED. The SARS-CoV-2 RNA is generally detectable in upper and lower respiratory specimens during the acute phase of infection. Negative results do not preclude SARS-CoV-2 infection, do not rule out co-infections with other pathogens, and should not be used as the sole basis for treatment or other patient management decisions. Negative results must be combined with clinical observations, patient history, and epidemiological information. The expected result is Negative. Fact Sheet for Patients: SugarRoll.be Fact Sheet for Healthcare Providers: https://www.woods-mathews.com/ This test is not yet approved or cleared by the [place] FDA and  has been authorized for detection and/or diagnosis of SARS-CoV-2 by FDA under an Emergency Use Authorization (EUA). This EUA will remain  in effect (meaning this test can be used) for the duration of the COVID-19 declaration under Section 56 4(b)(1) of the Act, 21 U.S.C. section 360bbb-3(b)(1), unless the authorization is terminated or revoked sooner. Performed at [facility], [place] [address], [place], [place] [address]   MRSA PCR Screening     Status: Abnormal   Collection Time: 08/07/19  8:26 PM   Specimen: Nasal Mucosa; Nasopharyngeal  Result Value Ref Range Status   MRSA by PCR POSITIVE (A) NEGATIVE Final    Comment:        The GeneXpert MRSA Assay (FDA approved for NASAL specimens only), is one component of a comprehensive MRSA colonization surveillance program. It is not intended to diagnose MRSA infection nor to guide or monitor treatment for MRSA infections. RESULT CALLED TO, READ BACK BY AND VERIFIED WITH: Dorena Bodo RN 08/07/19 2221 JDW Performed at [facility], [place] [address] [place], [place] [address]       Radiology Studies: No results  found.   Scheduled Meds: . atorvastatin  20 mg Oral QHS  . carvedilol  12.5 mg Oral BID  . Chlorhexidine Gluconate Cloth  6 each Topical Q0600  . darbepoetin (ARANESP) injection - DIALYSIS  40 mcg Intravenous Q Fri-HD  . heparin  2,400 Units Dialysis Once in dialysis  . pantoprazole  40 mg Oral Daily  . saccharomyces boulardii  250 mg Oral QHS   Continuous Infusions: . heparin 2,200 Units/hr (08/13/19 0606)  . piperacillin-tazobactam (ZOSYN)  IV 2.25 g (08/13/19 0600)  . vancomycin 750 mg (08/10/19 2134)     LOS: 7 days   Time Spent in minutes   30 minutes  Addalie Calles D.O. on 08/13/2019 at 11:34 AM  Between 7am to 7pm - Please see pager noted on [email or web address]  After 7pm go to [email or web address]  And look for the night coverage person covering for me after hours  [facility] Office  [phone]

## 2019-08-13 NOTE — H&P (View-Only) (Signed)
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  All questions answered.  Stop heparin on call to OR.  NPO after midnight  Wells Brabham 08/13/2019 8:25 AM --  Vitals:   08/13/19 0758 08/13/19 0800  BP: (!) 131/50   Pulse: 76 75  Resp: (!) 40 13  Temp: 97.9 F (36.6 C)   SpO2: 100% 97%    Intake/Output Summary (Last 24 hours) at 08/13/2019 0825 Last data filed at 08/13/2019 0657 Gross per 24 hour  Intake 775.33 ml  Output -  Net 775.33 ml     Laboratory CBC    Component Value Date/Time   WBC 25.9 (H) 08/13/2019 0237   HGB 9.5 (L) 08/13/2019 0237   HGB 14.3 12/14/2016 1139   HCT 29.8 (L) 08/13/2019 0237   HCT 44.2 12/14/2016 1139   PLT 549 (H) 08/13/2019 0237   PLT 429 (H) 12/14/2016 1139    BMET    Component Value Date/Time   NA 131 (L) 08/13/2019 0237   NA 137 12/14/2016 1139   K 3.8 08/13/2019 0237   K 4.2 12/14/2016 1139   CL 94 (L) 08/13/2019 0237   CO2 21 (L) 08/13/2019 0237   CO2 20 (L) 12/14/2016 1139   GLUCOSE 152 (H) 08/13/2019 0237   GLUCOSE 115 12/14/2016 1139   BUN 43 (H) 08/13/2019 0237   BUN 32.8 (H) 12/14/2016 1139   CREATININE 5.05 (H) 08/13/2019 0237   CREATININE 2.1 (H) 12/14/2016 1139   CALCIUM 8.5 (L) 08/13/2019 0237   CALCIUM 8.3 (L) 11/11/2017 0740   CALCIUM 9.5 12/14/2016 1139   GFRNONAA 8 (L) 08/13/2019 0237   GFRAA 10 (L) 08/13/2019 0237    COAG Lab Results  Component Value Date   INR 1.6 (H) 08/07/2019   INR 1.2 06/25/2019   INR 1.1 02/15/2019   No results found for: PTT  Antibiotics Anti-infectives (From admission, onward)   Start     Dose/Rate Route Frequency Ordered Stop   08/10/19 0922  Vancomycin (VANCOCIN) 750-5 MG/150ML-% IVPB    Note to Pharmacy: Murriel Hopper   : cabinet override   08/10/19 0922 08/10/19 2129   08/08/19 1914  Vancomycin (VANCOCIN) 750-5 MG/150ML-% IVPB    Note to Pharmacy: Cherylann Banas   : cabinet override      08/08/19 1914 08/08/19 2111   08/08/19 1200  vancomycin (VANCOCIN) IVPB 750 mg/150 ml premix     750 mg 150 mL/hr over 60 Minutes Intravenous Every M-W-F (Hemodialysis) 08/06/19 2229     08/07/19 1930  vancomycin (VANCOCIN) IVPB 750 mg/150 ml premix     750 mg 150 mL/hr over 60 Minutes Intravenous  Once 08/07/19 1844 08/07/19 2135   08/07/19 0830  piperacillin-tazobactam (ZOSYN) IVPB 2.25 g     2.25 g 100 mL/hr over 30 Minutes Intravenous Every 8 hours 08/07/19 0812     08/07/19 0800  piperacillin-tazobactam (ZOSYN) IVPB 3.375 g  Status:  Discontinued     3.375 g 12.5 mL/hr over 240 Minutes Intravenous Every 12 hours 08/07/19 0303 08/07/19 0812   08/06/19 2230  piperacillin-tazobactam (ZOSYN) IVPB 3.375 g     3.375 g 100 mL/hr over 30 Minutes Intravenous  Once 08/06/19 2222 08/06/19 2342   08/06/19 2230  vancomycin (VANCOCIN) 1,750 mg  in sodium chloride 0.9 % 500 mL IVPB     1,750 mg 250 mL/hr over 120 Minutes Intravenous  Once 08/06/19 2226 08/07/19 0429       V. Leia Alf, M.D., [facility] [place] Office: [phone] Pager:  [phone]

## 2019-08-13 NOTE — Progress Notes (Signed)
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  All questions answered.  Stop heparin on call to OR.  NPO after midnight  Wells Kristen Berry 08/13/2019 8:25 AM --  Vitals:   08/13/19 0758 08/13/19 0800  BP: (!) 131/50   Pulse: 76 75  Resp: (!) 40 13  Temp: 97.9 F (36.6 C)   SpO2: 100% 97%    Intake/Output Summary (Last 24 hours) at 08/13/2019 0825 Last data filed at 08/13/2019 0657 Gross per 24 hour  Intake 775.33 ml  Output -  Net 775.33 ml     Laboratory CBC    Component Value Date/Time   WBC 25.9 (H) 08/13/2019 0237   HGB 9.5 (L) 08/13/2019 0237   HGB 14.3 12/14/2016 1139   HCT 29.8 (L) 08/13/2019 0237   HCT 44.2 12/14/2016 1139   PLT 549 (H) 08/13/2019 0237   PLT 429 (H) 12/14/2016 1139    BMET    Component Value Date/Time   NA 131 (L) 08/13/2019 0237   NA 137 12/14/2016 1139   K 3.8 08/13/2019 0237   K 4.2 12/14/2016 1139   CL 94 (L) 08/13/2019 0237   CO2 21 (L) 08/13/2019 0237   CO2 20 (L) 12/14/2016 1139   GLUCOSE 152 (H) 08/13/2019 0237   GLUCOSE 115 12/14/2016 1139   BUN 43 (H) 08/13/2019 0237   BUN 32.8 (H) 12/14/2016 1139   CREATININE 5.05 (H) 08/13/2019 0237   CREATININE 2.1 (H) 12/14/2016 1139   CALCIUM 8.5 (L) 08/13/2019 0237   CALCIUM 8.3 (L) 11/11/2017 0740   CALCIUM 9.5 12/14/2016 1139   GFRNONAA 8 (L) 08/13/2019 0237   GFRAA 10 (L) 08/13/2019 0237    COAG Lab Results  Component Value Date   INR 1.6 (H) 08/07/2019   INR 1.2 06/25/2019   INR 1.1 02/15/2019   No results found for: PTT  Antibiotics Anti-infectives (From admission, onward)   Start     Dose/Rate Route Frequency Ordered Stop   08/10/19 0922  Vancomycin (VANCOCIN) 750-5 MG/150ML-% IVPB    Note to Pharmacy: Murriel Hopper   : cabinet override   08/10/19 0922 08/10/19 2129   08/08/19 1914  Vancomycin (VANCOCIN) 750-5 MG/150ML-% IVPB    Note to Pharmacy: Cherylann Banas   : cabinet override      08/08/19 1914 08/08/19 2111   08/08/19 1200  vancomycin (VANCOCIN) IVPB 750 mg/150 ml premix     750 mg 150 mL/hr over 60 Minutes Intravenous Every M-W-F (Hemodialysis) 08/06/19 2229     08/07/19 1930  vancomycin (VANCOCIN) IVPB 750 mg/150 ml premix     750 mg 150 mL/hr over 60 Minutes Intravenous  Once 08/07/19 1844 08/07/19 2135   08/07/19 0830  piperacillin-tazobactam (ZOSYN) IVPB 2.25 g     2.25 g 100 mL/hr over 30 Minutes Intravenous Every 8 hours 08/07/19 0812     08/07/19 0800  piperacillin-tazobactam (ZOSYN) IVPB 3.375 g  Status:  Discontinued     3.375 g 12.5 mL/hr over 240 Minutes Intravenous Every 12 hours 08/07/19 0303 08/07/19 0812   08/06/19 2230  piperacillin-tazobactam (ZOSYN) IVPB 3.375 g     3.375 g 100 mL/hr over 30 Minutes Intravenous  Once 08/06/19 2222 08/06/19 2342   08/06/19 2230  vancomycin (VANCOCIN) 1,750 mg  in sodium chloride 0.9 % 500 mL IVPB     1,750 mg 250 mL/hr over 120 Minutes Intravenous  Once 08/06/19 2226 08/07/19 0429       V. Leia Alf, M.D., [facility] [place] Office: [phone] Pager:  [phone]

## 2019-08-13 NOTE — Progress Notes (Signed)
CRITICAL VALUE ALERT  Critical Value:  MRSA PCR positive; Staph aureus PCR - positive  Date & Time Notied:  12:08 o'clock; 08/13/2019  Provider Notified: Dr. Ree Kida  Orders Received/Actions taken: MD aware

## 2019-08-13 NOTE — Procedures (Signed)
Patient was seen on dialysis and the procedure was supervised.  BFR 400  Via AVF BP is  128/48.   Patient appears to be tolerating treatment well  Kristen Berry 08/13/2019

## 2019-08-13 NOTE — Progress Notes (Signed)
Physical Therapy Treatment Patient Details Name: Kristen Berry MRN: [id number] DOB: 12/30/1955 Today's Date: 08/13/2019    History of Present Illness 63 year old female, living alone, with PMH of diet-controlled DM 2, HTN, HLD, ESRD on MWF HD, OSA not on CPAP, PAD s/p right BKA, CAD, chronic diastolic CHF, anemia ,CVA, DVT, hospitalized 9/28-10/1 for cellulitis of left foot, missed a week of HD due to lack of transport to HD, presented to [facility] ED on 08/06/2019 due to generally feeling unwell, increased swelling of left foot, blackish discoloration of left great toe with drainage ongoing for last 2 weeks.      PT Comments    Pt worked on sit<>stand within stedy today as she cannot currently tolerate pivoting on L foot. Increased drainage noted in [place], RN present and aware. Mod A +2 needed for sit<>stand. Stedy used to pivot pt to chair though shortly after, HD called to come get pt. Pt did not feel she could tolerate standing again so maximove used for return to bed. PT will continue to follow.   Follow Up Recommendations  SNF     Equipment Recommendations  Other (comment)(TBA)    Recommendations for Other Services OT consult     Precautions / Restrictions Precautions Precautions: Fall;Other (comment) Precaution Comments: R BKA Restrictions Weight Bearing Restrictions: No    Mobility  Bed Mobility Overal bed mobility: Needs Assistance Bed Mobility: Supine to Sit     Supine to sit: Min guard     General bed mobility comments: Increased time/effort, no physical assist required  Transfers Overall transfer level: Needs assistance Equipment used: Ambulation equipment used Transfers: Sit to/from [facility] Sit to Stand: Mod assist;+2 physical assistance Stand pivot transfers: Total assist       General transfer comment: pt stood 3x in stedy with mod A +2 each time for power up, vc's for extension though hips. Pt unable to hop on LLE currently so stedy  used. Increased drainage noted L foot with WB'ing, RN present and noted. After session, HD called and was coming to get pt, pt could not tolerate further WB"ing LLE so maximove used for return to bed  Ambulation/Gait             General Gait Details: unable   Stairs             Wheelchair Mobility    Modified Rankin (Stroke Patients Only)       Balance Overall balance assessment: Needs assistance Sitting-balance support: Feet supported Sitting balance-Leahy Scale: Fair     Standing balance support: Bilateral upper extremity supported Standing balance-Leahy Scale: Poor Standing balance comment: heavy reliance on UE support as well as needing mod external support                            Cognition Arousal/Alertness: Awake/alert Behavior During Therapy: WFL for tasks assessed/performed Overall Cognitive Status: Within Functional Limits for tasks assessed                                        Exercises      General Comments General comments (skin integrity, edema, etc.): VSS      Pertinent Vitals/Pain Pain Assessment: 0-10 Pain Score: 10-Worst pain ever Pain Location: left toe Pain Descriptors / Indicators: Grimacing Pain Intervention(s): Limited activity within patient's tolerance;Patient requesting pain meds-RN notified;RN gave pain meds  during session    Home Living                      Prior Function            PT Goals (current goals can now be found in the care plan section) Acute Rehab PT Goals Patient Stated Goal: "have revascularization surgery." PT Goal Formulation: With patient Time For Goal Achievement: 08/22/19 Potential to Achieve Goals: Fair Progress towards PT goals: Progressing toward goals    Frequency    Min 3X/week      PT Plan Current plan remains appropriate    Co-evaluation              AM-PAC PT "6 Clicks" Mobility   Outcome Measure  Help needed turning from your  back to your side while in a flat bed without using bedrails?: A Little Help needed moving from lying on your back to sitting on the side of a flat bed without using bedrails?: A Little Help needed moving to and from a bed to a chair (including a wheelchair)?: A Lot Help needed standing up from a chair using your arms (e.g., wheelchair or bedside chair)?: A Lot Help needed to walk in hospital room?: Total Help needed climbing 3-5 steps with a railing? : Total 6 Click Score: 12    End of Session Equipment Utilized During Treatment: Gait belt Activity Tolerance: Patient limited by pain Patient left: in bed;with call bell/phone within reach;with nursing/sitter in room Nurse Communication: Mobility status PT Visit Diagnosis: Other abnormalities of gait and mobility (R26.89);Pain Pain - Right/Left: Left Pain - part of body: Ankle and joints of foot     Time: 1135-1207 PT Time Calculation (min) (ACUTE ONLY): 32 min  Charges:  $Therapeutic Activity: 23-37 mins                     Leighton Roach, [place]  Pager [phone] Office [place] 08/13/2019, 1:59 PM

## 2019-08-13 NOTE — Progress Notes (Signed)
Dressing was changed on left foot. Patient tolerated well. Will continue to monitor.

## 2019-08-13 NOTE — Progress Notes (Signed)
[facility] Progress Note   Subjective:   Patient seen and examined at bedside.  Reports minimal pain in left foot and continued drainage.  Denies CP, SOB, n/v/d, abdominal pain, fever and chills.  Says vascular is going to amputate toe tomorrow and possibly work on her access later this week.   Objective Vitals:   08/13/19 0423 08/13/19 0500 08/13/19 0758 08/13/19 0800  BP:   (!) 131/50   Pulse:   76 75  Resp:   (!) 40 13  Temp: 98.7 F (37.1 C)  97.9 F (36.6 C)   TempSrc: Oral  Oral   SpO2:   100% 97%  Weight:  85.8 kg    Height:       Physical Exam General:NAD, well appearing female, sitting in bed Heart:RRR Lungs:CTAB Abdomen:soft, NTND, +BS Extremities:R BKA - no stump edema, left foot dressed c/d/i - trace LLE edema.  Dialysis Access: R IJ TDC, LU AVF +b   Filed Weights   08/10/19 1224 08/12/19 0603 08/13/19 0500  Weight: 81.7 kg 83.3 kg 85.8 kg    Intake/Output Summary (Last 24 hours) at 08/13/2019 1013 Last data filed at 08/13/2019 0950 Gross per 24 hour  Intake 895.33 ml  Output -  Net 895.33 ml    Additional Objective Labs: Basic Metabolic Panel: Recent Labs  Lab 08/10/19 0223 08/11/19 0258 08/12/19 0714 08/13/19 0237  NA 129* 131* 132* 131*  K 3.5 2.8* 3.6 3.8  CL 90* 92* 93* 94*  CO2 21* 23 22 21*  GLUCOSE 114* 143* 185* 152*  BUN 32* 17 34* 43*  CREATININE 3.79* 3.07* 4.66* 5.05*  CALCIUM 8.0* 8.2* 8.4* 8.5*  PHOS 3.7 2.5  --  3.0   Liver Function Tests: Recent Labs  Lab 08/06/19 2235  08/10/19 0223 08/11/19 0258 08/13/19 0237  AST 10*  --   --   --   --   ALT 7  --   --   --   --   ALKPHOS 162*  --   --   --   --   BILITOT 0.9  --   --   --   --   PROT 8.0  --   --   --   --   ALBUMIN 2.9*   < > 2.3* 2.2* 2.1*   < > = values in this interval not displayed.   CBC: Recent Labs  Lab 08/06/19 2058  08/09/19 0231 08/10/19 0223 08/11/19 0258 08/12/19 0714 08/13/19 0237  WBC 27.3*   < > 29.1* 27.4* 25.1* 26.2*  25.9*  NEUTROABS 23.4*  --   --   --   --   --   --   HGB 12.0   < > 10.6* 9.8* 9.9* 9.5* 9.5*  HCT 37.0   < > 32.8* 31.1* 31.2* 29.7* 29.8*  MCV 94.9   < > 93.7 94.8 93.7 94.9 95.5  PLT 965*   < > 793* 708* 616* 525* 549*   < > = values in this interval not displayed.   Blood Culture    Component Value Date/Time   SDES BLOOD RIGHT HAND 08/06/2019 2144   SPECREQUEST  08/06/2019 2144    BOTTLES DRAWN AEROBIC ONLY Blood Culture results may not be optimal due to an inadequate volume of blood received in culture bottles   CULT  08/06/2019 2144    NO GROWTH 5 DAYS Performed at [facility], [place] [address] [place], [place] [address]    REPTSTATUS 08/11/2019  FINAL 08/06/2019 2144    CBG: Recent Labs  Lab 08/12/19 1659 08/12/19 2034 08/13/19 0010 08/13/19 0424 08/13/19 0757  GLUCAP 173* 243* 151* 155* 137*   Lab Results  Component Value Date   INR 1.6 (H) 08/07/2019   INR 1.2 06/25/2019   INR 1.1 02/15/2019   Studies/Results: No results found.  Medications: . heparin 2,200 Units/hr (08/13/19 0606)  . piperacillin-tazobactam (ZOSYN)  IV 2.25 g (08/13/19 0600)  . vancomycin 750 mg (08/10/19 2134)   . atorvastatin  20 mg Oral QHS  . carvedilol  12.5 mg Oral BID  . Chlorhexidine Gluconate Cloth  6 each Topical Q0600  . darbepoetin (ARANESP) injection - DIALYSIS  40 mcg Intravenous Q Fri-HD  . pantoprazole  40 mg Oral Daily  . saccharomyces boulardii  250 mg Oral QHS   Home meds: - amlodipine 5/ carvedilol 25 bid/ hydralazine 25 bid - apixaban 2.5 bid - atorvastatin 20 hs/ ticagrelor 90 bid - pantoprazole 40 hs/ calc acetate ac tid - prn's/ vitamins/ supplements  Dialysis Orders: MWF [facility]  4h 350/800 80kg 2/2.25 bath TDC Heparin 2400 - LUE AVF (poorly matured, not usingUNTIL LOWER EXTREM Infect/ PAD issue stable) - calc 0.25 tiw  Assessment/Plan: 1. GangreneLeftFoot.- On Zosyn, Vanco.  To have revascularization procedure tomorrow w/toe  amputation per [facility].  2. ESRD - MWF HD. HD today per regular schedule. Pt missed one week HD, living alone since dtr moved out and doesn't have reliable transportation due to [place] dependence. Consulting renal SW.  3. Hypokalemia = improved. 2.8 on admit, s/p po supplement. K 3.8 today. Diet liberalized to carb modified. Use ^K bath.  4. HTN/ vol -BP well controlled on reduced dose home meds - amlodipine & hydralazine d/c, carvedilol lowered to 12.5mg  BID,  5. Anemia ckd - Hb 9.5. started Aranesp 40Hd 11/13/ HO TFS 15% Feritin 208 =07/25/19 Op labs. Holding Fe due to infection/gangrene 6. Secondary Hyperparathyroidism - On Hd Ca8.5 Phos3.0Continue calcitriol,Ca acetate 1 ac- d/c due to low phos, follow and add back when needed. 7. H/o CVA\ 8. Chron anticoag - was on eliquis as OP d/tho dvt Per op [facility] history. On Heparin.  Jen Mow, PA-C [facility] Pager: [phone] 08/13/2019,10:13 AM  LOS: 7 days

## 2019-08-14 ENCOUNTER — Encounter (HOSPITAL_COMMUNITY)
Admission: EM | Disposition: A | Discharge status: Skilled Nursing Facility | Payer: Self-pay | Source: Home / Self Care | Attending: Family Medicine

## 2019-08-14 ENCOUNTER — Inpatient Hospital Stay (HOSPITAL_COMMUNITY): Payer: Medicare HMO | Admitting: Certified Registered Nurse Anesthetist

## 2019-08-14 ENCOUNTER — Inpatient Hospital Stay (HOSPITAL_COMMUNITY): Payer: Medicare HMO | Admitting: Anesthesiology

## 2019-08-14 ENCOUNTER — Encounter (HOSPITAL_COMMUNITY): Payer: Self-pay | Admitting: Certified Registered Nurse Anesthetist

## 2019-08-14 DIAGNOSIS — I9789 Other postprocedural complications and disorders of the circulatory system, not elsewhere classified: Secondary | ICD-10-CM

## 2019-08-14 DIAGNOSIS — I70262 Atherosclerosis of native arteries of extremities with gangrene, left leg: Secondary | ICD-10-CM

## 2019-08-14 HISTORY — PX: WOUND EXPLORATION: SHX6188

## 2019-08-14 HISTORY — PX: APPLICATION OF WOUND VAC: SHX5189

## 2019-08-14 HISTORY — PX: FEMORAL-POPLITEAL BYPASS GRAFT: SHX937

## 2019-08-14 HISTORY — PX: AMPUTATION: SHX166

## 2019-08-14 HISTORY — PX: HEMATOMA EVACUATION: SHX5118

## 2019-08-14 LAB — POCT I-STAT EG7
Acid-base deficit: 2 mmol/L (ref 0.0–2.0)
Acid-base deficit: 4 mmol/L — ABNORMAL HIGH (ref 0.0–2.0)
Bicarbonate: 23.3 mmol/L (ref 20.0–28.0)
Bicarbonate: 22 mmol/L (ref 20.0–28.0)
Calcium, Ion: 1 mmol/L — ABNORMAL LOW (ref 1.15–1.40)
Calcium, Ion: 0.98 mmol/L — ABNORMAL LOW (ref 1.15–1.40)
HCT: 33 % — ABNORMAL LOW (ref 36.0–46.0)
HCT: 29 % — ABNORMAL LOW (ref 36.0–46.0)
Hemoglobin: 11.2 g/dL — ABNORMAL LOW (ref 12.0–15.0)
Hemoglobin: 9.9 g/dL — ABNORMAL LOW (ref 12.0–15.0)
O2 Saturation: 41 %
O2 Saturation: 63 %
Potassium: 4.6 mmol/L (ref 3.5–5.1)
Potassium: 4.6 mmol/L (ref 3.5–5.1)
Sodium: 134 mmol/L — ABNORMAL LOW (ref 135–145)
Sodium: 135 mmol/L (ref 135–145)
TCO2: 25 mmol/L (ref 22–32)
TCO2: 23 mmol/L (ref 22–32)
pCO2, Ven: 43.1 mmHg — ABNORMAL LOW (ref 44.0–60.0)
pCO2, Ven: 43.5 mmHg — ABNORMAL LOW (ref 44.0–60.0)
pH, Ven: 7.342 (ref 7.250–7.430)
pH, Ven: 7.312 (ref 7.250–7.430)
pO2, Ven: 25 mmHg — CL (ref 32.0–45.0)
pO2, Ven: 36 mmHg (ref 32.0–45.0)

## 2019-08-14 LAB — GLUCOSE, CAPILLARY
Glucose-Capillary: 119 mg/dL — ABNORMAL HIGH (ref 70–99)
Glucose-Capillary: 133 mg/dL — ABNORMAL HIGH (ref 70–99)
Glucose-Capillary: 137 mg/dL — ABNORMAL HIGH (ref 70–99)
Glucose-Capillary: 141 mg/dL — ABNORMAL HIGH (ref 70–99)
Glucose-Capillary: 147 mg/dL — ABNORMAL HIGH (ref 70–99)
Glucose-Capillary: 160 mg/dL — ABNORMAL HIGH (ref 70–99)

## 2019-08-14 LAB — CBC
HCT: 29.7 % — ABNORMAL LOW (ref 36.0–46.0)
Hemoglobin: 9.4 g/dL — ABNORMAL LOW (ref 12.0–15.0)
MCH: 30.4 pg (ref 26.0–34.0)
MCHC: 31.6 g/dL (ref 30.0–36.0)
MCV: 96.1 fL (ref 80.0–100.0)
Platelets: 513 10*3/uL — ABNORMAL HIGH (ref 150–400)
RBC: 3.09 MIL/uL — ABNORMAL LOW (ref 3.87–5.11)
RDW: 16.7 % — ABNORMAL HIGH (ref 11.5–15.5)
WBC: 24.5 10*3/uL — ABNORMAL HIGH (ref 4.0–10.5)
nRBC: 0 % (ref 0.0–0.2)

## 2019-08-14 LAB — RENAL FUNCTION PANEL
Albumin: 2.1 g/dL — ABNORMAL LOW (ref 3.5–5.0)
Anion gap: 15 (ref 5–15)
BUN: 12 mg/dL (ref 8–23)
CO2: 24 mmol/L (ref 22–32)
Calcium: 8.4 mg/dL — ABNORMAL LOW (ref 8.9–10.3)
Chloride: 96 mmol/L — ABNORMAL LOW (ref 98–111)
Creatinine, Ser: 2.68 mg/dL — ABNORMAL HIGH (ref 0.44–1.00)
GFR calc Af Amer: 21 mL/min — ABNORMAL LOW (ref >60)
GFR calc non Af Amer: 18 mL/min — ABNORMAL LOW (ref >60)
Glucose, Bld: 148 mg/dL — ABNORMAL HIGH (ref 70–99)
Phosphorus: 2.6 mg/dL (ref 2.5–4.6)
Potassium: 3.8 mmol/L (ref 3.5–5.1)
Sodium: 135 mmol/L (ref 135–145)

## 2019-08-14 LAB — PREPARE RBC (CROSSMATCH)

## 2019-08-14 LAB — HEPARIN LEVEL (UNFRACTIONATED): Heparin Unfractionated: 0.56 IU/mL (ref 0.30–0.70)

## 2019-08-14 SURGERY — BYPASS GRAFT FEMORAL-POPLITEAL ARTERY
Anesthesia: General | Site: Leg Upper | Laterality: Left

## 2019-08-14 SURGERY — CREATION, BYPASS, ARTERIAL, FEMORAL TO FEMORAL, USING GRAFT
Anesthesia: General | Laterality: Left

## 2019-08-14 SURGERY — EVACUATION HEMATOMA
Anesthesia: General | Laterality: Left

## 2019-08-14 MED ORDER — PHENYLEPHRINE 40 MCG/ML (10ML) SYRINGE FOR IV PUSH (FOR BLOOD PRESSURE SUPPORT)
PREFILLED_SYRINGE | INTRAVENOUS | Status: DC | PRN
  Administered 2019-08-14: 120 ug via INTRAVENOUS

## 2019-08-14 MED ORDER — PROPOFOL 10 MG/ML IV BOLUS
INTRAVENOUS | Status: DC | PRN
  Administered 2019-08-14: 130 mg via INTRAVENOUS

## 2019-08-14 MED ORDER — PROTAMINE SULFATE 10 MG/ML IV SOLN
INTRAVENOUS | Status: DC | PRN
  Administered 2019-08-14 (×5): 10 mg via INTRAVENOUS

## 2019-08-14 MED ORDER — EPHEDRINE SULFATE 50 MG/ML IJ SOLN
INTRAMUSCULAR | Status: DC | PRN
  Administered 2019-08-14: 5 mg via INTRAVENOUS

## 2019-08-14 MED ORDER — MIDAZOLAM HCL 2 MG/2ML IJ SOLN
INTRAMUSCULAR | Status: AC
  Filled 2019-08-14: qty 2

## 2019-08-14 MED ORDER — 0.9 % SODIUM CHLORIDE (POUR BTL) OPTIME
TOPICAL | Status: DC | PRN
  Administered 2019-08-14: 2000 mL

## 2019-08-14 MED ORDER — PHENYLEPHRINE HCL-NACL 10-0.9 MG/250ML-% IV SOLN
INTRAVENOUS | Status: DC | PRN
  Administered 2019-08-14: 25 ug/min via INTRAVENOUS

## 2019-08-14 MED ORDER — ALBUMIN HUMAN 5 % IV SOLN
INTRAVENOUS | Status: DC | PRN
  Administered 2019-08-14: 17:00:00 via INTRAVENOUS

## 2019-08-14 MED ORDER — OXYCODONE HCL 5 MG PO TABS: 5.0000 mg | ORAL_TABLET | Freq: Once | ORAL | Status: DC | PRN

## 2019-08-14 MED ORDER — METOPROLOL TARTRATE 5 MG/5ML IV SOLN: 2.0000 mg | INTRAVENOUS | Status: DC | PRN

## 2019-08-14 MED ORDER — SODIUM CHLORIDE 0.9 % IV SOLN
INTRAVENOUS | Status: AC
  Filled 2019-08-14: qty 1.2

## 2019-08-14 MED ORDER — SODIUM CHLORIDE 0.9 % IV SOLN
INTRAVENOUS | Status: DC
  Administered 2019-08-14: 11:00:00 via INTRAVENOUS

## 2019-08-14 MED ORDER — ETOMIDATE 2 MG/ML IV SOLN
INTRAVENOUS | Status: DC | PRN
  Administered 2019-08-14: 11 mg via INTRAVENOUS

## 2019-08-14 MED ORDER — PROPOFOL 10 MG/ML IV BOLUS
INTRAVENOUS | Status: AC
  Filled 2019-08-14: qty 20

## 2019-08-14 MED ORDER — SUCCINYLCHOLINE CHLORIDE 20 MG/ML IJ SOLN
INTRAMUSCULAR | Status: DC | PRN
  Administered 2019-08-14: 140 mg via INTRAVENOUS

## 2019-08-14 MED ORDER — SODIUM CHLORIDE 0.9 % IV SOLN
INTRAVENOUS | Status: DC | PRN
  Administered 2019-08-14: 500 mL

## 2019-08-14 MED ORDER — DOPAMINE-DEXTROSE 3.2-5 MG/ML-% IV SOLN
0.0000 ug/kg/min | INTRAVENOUS | Status: DC
  Filled 2019-08-14: qty 250

## 2019-08-14 MED ORDER — ALBUMIN HUMAN 5 % IV SOLN
INTRAVENOUS | Status: AC
  Filled 2019-08-14: qty 250

## 2019-08-14 MED ORDER — FENTANYL CITRATE (PF) 250 MCG/5ML IJ SOLN
INTRAMUSCULAR | Status: AC
  Filled 2019-08-14: qty 5

## 2019-08-14 MED ORDER — HYDRALAZINE HCL 20 MG/ML IJ SOLN: 5.0000 mg | INTRAMUSCULAR | Status: DC | PRN

## 2019-08-14 MED ORDER — ALBUMIN HUMAN 5 % IV SOLN
12.5000 g | Freq: Once | INTRAVENOUS | Status: AC
  Administered 2019-08-14: 12.5 g via INTRAVENOUS

## 2019-08-14 MED ORDER — MORPHINE SULFATE (PF) 2 MG/ML IV SOLN: 2.0000 mg | INTRAVENOUS | Status: DC | PRN

## 2019-08-14 MED ORDER — ALBUMIN HUMAN 5 % IV SOLN
INTRAVENOUS | Status: DC | PRN
  Administered 2019-08-14: 20:00:00 via INTRAVENOUS

## 2019-08-14 MED ORDER — MIDAZOLAM HCL 5 MG/5ML IJ SOLN
INTRAMUSCULAR | Status: DC | PRN
  Administered 2019-08-14: 1 mg via INTRAVENOUS

## 2019-08-14 MED ORDER — FENTANYL CITRATE (PF) 100 MCG/2ML IJ SOLN: 25.0000 ug | INTRAMUSCULAR | Status: DC | PRN

## 2019-08-14 MED ORDER — DOPAMINE-DEXTROSE 3.2-5 MG/ML-% IV SOLN
2.0000 ug/kg/min | INTRAVENOUS | Status: DC
  Administered 2019-08-14: 5 ug/kg/min via INTRAVENOUS
  Administered 2019-08-16: 2.533 ug/kg/min via INTRAVENOUS
  Filled 2019-08-14 (×2): qty 250

## 2019-08-14 MED ORDER — OXYCODONE HCL 5 MG/5ML PO SOLN: 5.0000 mg | Freq: Once | ORAL | Status: DC | PRN

## 2019-08-14 MED ORDER — ONDANSETRON HCL 4 MG/2ML IJ SOLN
INTRAMUSCULAR | Status: DC | PRN
  Administered 2019-08-14: 4 mg via INTRAVENOUS

## 2019-08-14 MED ORDER — PHENYLEPHRINE HCL (PRESSORS) 10 MG/ML IV SOLN
INTRAVENOUS | Status: DC | PRN
  Administered 2019-08-14: 100 ug via INTRAVENOUS

## 2019-08-14 MED ORDER — HEPARIN SODIUM (PORCINE) 1000 UNIT/ML IJ SOLN
INTRAMUSCULAR | Status: DC | PRN
  Administered 2019-08-14: 8000 [IU] via INTRAVENOUS
  Administered 2019-08-14: 1000 [IU] via INTRAVENOUS

## 2019-08-14 MED ORDER — HEMOSTATIC AGENTS (NO CHARGE) OPTIME
TOPICAL | Status: DC | PRN
  Administered 2019-08-14: 1 via TOPICAL

## 2019-08-14 MED ORDER — FENTANYL CITRATE (PF) 100 MCG/2ML IJ SOLN
INTRAMUSCULAR | Status: DC | PRN
  Administered 2019-08-14 (×4): 50 ug via INTRAVENOUS

## 2019-08-14 MED ORDER — SUCCINYLCHOLINE CHLORIDE 20 MG/ML IJ SOLN: INTRAMUSCULAR | Status: DC | PRN

## 2019-08-14 MED ORDER — PIPERACILLIN-TAZOBACTAM IN DEX 2-0.25 GM/50ML IV SOLN
2.2500 g | INTRAVENOUS | Status: DC
  Filled 2019-08-14: qty 50

## 2019-08-14 MED ORDER — LIDOCAINE 2% (20 MG/ML) 5 ML SYRINGE
INTRAMUSCULAR | Status: DC | PRN
  Administered 2019-08-14: 25 mg via INTRAVENOUS

## 2019-08-14 MED ORDER — LABETALOL HCL 5 MG/ML IV SOLN: 10.0000 mg | INTRAVENOUS | Status: DC | PRN

## 2019-08-14 MED ORDER — SUCCINYLCHOLINE CHLORIDE 200 MG/10ML IV SOSY
PREFILLED_SYRINGE | INTRAVENOUS | Status: DC | PRN
  Administered 2019-08-14: 100 mg via INTRAVENOUS

## 2019-08-14 MED ORDER — ONDANSETRON HCL 4 MG/2ML IJ SOLN: 4.0000 mg | Freq: Once | INTRAMUSCULAR | Status: DC | PRN

## 2019-08-14 SURGICAL SUPPLY — 81 items
BANDAGE ESMARK 6X9 LF (GAUZE/BANDAGES/DRESSINGS) IMPLANT
BLADE AVERAGE 25X9 (BLADE) ×4 IMPLANT
BLADE SAW SGTL 81X20 HD (BLADE) IMPLANT
BNDG ELASTIC 4X5.8 VLCR STR LF (GAUZE/BANDAGES/DRESSINGS) ×4 IMPLANT
BNDG ESMARK 6X9 LF (GAUZE/BANDAGES/DRESSINGS)
BNDG GAUZE ELAST 4 BULKY (GAUZE/BANDAGES/DRESSINGS) ×4 IMPLANT
CANISTER SUCT 3000ML PPV (MISCELLANEOUS) ×4 IMPLANT
CANISTER WOUNDNEG PRESSURE 500 (CANNISTER) ×1 IMPLANT
CANNULA VESSEL 3MM 2 BLNT TIP (CANNULA) ×4 IMPLANT
CLIP VESOCCLUDE MED 24/CT (CLIP) ×4 IMPLANT
CLIP VESOCCLUDE SM WIDE 24/CT (CLIP) ×4 IMPLANT
COVER SURGICAL LIGHT HANDLE (MISCELLANEOUS) ×4 IMPLANT
COVER WAND RF STERILE (DRAPES) ×3 IMPLANT
CUFF TOURN SGL QUICK 24 (TOURNIQUET CUFF)
CUFF TOURN SGL QUICK 34 (TOURNIQUET CUFF)
CUFF TOURN SGL QUICK 42 (TOURNIQUET CUFF) IMPLANT
CUFF TRNQT CYL 24X4X16.5-23 (TOURNIQUET CUFF) IMPLANT
CUFF TRNQT CYL 34X4.125X (TOURNIQUET CUFF) IMPLANT
DERMABOND ADVANCED (GAUZE/BANDAGES/DRESSINGS) ×4
DERMABOND ADVANCED .7 DNX12 (GAUZE/BANDAGES/DRESSINGS) IMPLANT
DRAIN CHANNEL 15F RND FF W/TCR (WOUND CARE) IMPLANT
DRAPE EXTREMITY T 121X128X90 (DISPOSABLE) ×4 IMPLANT
DRAPE HALF SHEET 40X57 (DRAPES) ×4 IMPLANT
DRAPE X-RAY CASS 24X20 (DRAPES) IMPLANT
DRSG VAC ATS MED SENSATRAC (GAUZE/BANDAGES/DRESSINGS) ×1 IMPLANT
ELECT REM PT RETURN 9FT ADLT (ELECTROSURGICAL) ×4
ELECTRODE REM PT RTRN 9FT ADLT (ELECTROSURGICAL) ×3 IMPLANT
EVACUATOR SILICONE 100CC (DRAIN) IMPLANT
FILTER NEPTUNE SMOKE EVACUATOR ×1 IMPLANT
GAUZE SPONGE 4X4 12PLY STRL (GAUZE/BANDAGES/DRESSINGS) ×4 IMPLANT
GLOVE BIO SURGEON STRL SZ 6.5 (GLOVE) ×2 IMPLANT
GLOVE BIOGEL PI IND STRL 6 (GLOVE) IMPLANT
GLOVE BIOGEL PI IND STRL 6.5 (GLOVE) IMPLANT
GLOVE BIOGEL PI IND STRL 7.0 (GLOVE) IMPLANT
GLOVE BIOGEL PI IND STRL 7.5 (GLOVE) ×3 IMPLANT
GLOVE BIOGEL PI INDICATOR 6 (GLOVE) ×3
GLOVE BIOGEL PI INDICATOR 6.5 (GLOVE) ×1
GLOVE BIOGEL PI INDICATOR 7.0 (GLOVE) ×2
GLOVE BIOGEL PI INDICATOR 7.5 (GLOVE) ×1
GLOVE SKINSENSE NS SZ6.5 (GLOVE) ×1
GLOVE SKINSENSE STRL SZ6.5 (GLOVE) IMPLANT
GLOVE SURG SS PI 7.5 STRL IVOR (GLOVE) ×4 IMPLANT
GOWN STRL REUS W/ TWL LRG LVL3 (GOWN DISPOSABLE) ×6 IMPLANT
GOWN STRL REUS W/ TWL XL LVL3 (GOWN DISPOSABLE) ×3 IMPLANT
GOWN STRL REUS W/TWL LRG LVL3 (GOWN DISPOSABLE) ×5
GOWN STRL REUS W/TWL XL LVL3 (GOWN DISPOSABLE) ×2
HEMOSTAT SNOW SURGICEL 2X4 (HEMOSTASIS) IMPLANT
INSERT FOGARTY SM (MISCELLANEOUS) IMPLANT
KIT BASIN OR (CUSTOM PROCEDURE TRAY) ×4 IMPLANT
KIT TURNOVER KIT B (KITS) ×4 IMPLANT
MARKER GRAFT CORONARY BYPASS (MISCELLANEOUS) IMPLANT
NDL HYPO 25GX1X1/2 BEV (NEEDLE) IMPLANT
NEEDLE HYPO 25GX1X1/2 BEV (NEEDLE) IMPLANT
NS IRRIG 1000ML POUR BTL (IV SOLUTION) ×8 IMPLANT
PACK GENERAL/GYN (CUSTOM PROCEDURE TRAY) ×3 IMPLANT
PACK PERIPHERAL VASCULAR (CUSTOM PROCEDURE TRAY) ×4 IMPLANT
PAD ARMBOARD 7.5X6 YLW CONV (MISCELLANEOUS) ×8 IMPLANT
SET COLLECT BLD 21X3/4 12 (NEEDLE) IMPLANT
SPONGE LAP 18X18 RF (DISPOSABLE) ×1 IMPLANT
STOPCOCK 4 WAY LG BORE MALE ST (IV SETS) IMPLANT
SUT ETHILON 3 0 PS 1 (SUTURE) ×4 IMPLANT
SUT GORETEX 6.0 TT13 (SUTURE) IMPLANT
SUT GORETEX 6.0 TT9 (SUTURE) IMPLANT
SUT PROLENE 5 0 C 1 24 (SUTURE) ×5 IMPLANT
SUT PROLENE 6 0 BV (SUTURE) ×4 IMPLANT
SUT PROLENE 7 0 BV 1 (SUTURE) ×1 IMPLANT
SUT SILK 2 0 SH (SUTURE) ×4 IMPLANT
SUT SILK 3 0 (SUTURE) ×2
SUT SILK 3-0 18XBRD TIE 12 (SUTURE) IMPLANT
SUT VIC AB 2-0 CT1 27 (SUTURE) ×3
SUT VIC AB 2-0 CT1 TAPERPNT 27 (SUTURE) ×6 IMPLANT
SUT VIC AB 3-0 SH 27 (SUTURE) ×5
SUT VIC AB 3-0 SH 27X BRD (SUTURE) ×6 IMPLANT
SUT VIC AB 4-0 PS2 18 (SUTURE) ×3 IMPLANT
SUT VICRYL 4-0 PS2 18IN ABS (SUTURE) ×8 IMPLANT
SYR CONTROL 10ML LL (SYRINGE) IMPLANT
TOWEL GREEN STERILE (TOWEL DISPOSABLE) ×8 IMPLANT
TRAY FOLEY MTR SLVR 16FR STAT (SET/KITS/TRAYS/PACK) ×4 IMPLANT
TUBING EXTENTION W/L.L. (IV SETS) IMPLANT
UNDERPAD 30X30 (UNDERPADS AND DIAPERS) ×4 IMPLANT
WATER STERILE IRR 1000ML POUR (IV SOLUTION) ×4 IMPLANT

## 2019-08-14 SURGICAL SUPPLY — 46 items
BANDAGE ESMARK 6X9 LF (GAUZE/BANDAGES/DRESSINGS) IMPLANT
BNDG ESMARK 6X9 LF (GAUZE/BANDAGES/DRESSINGS)
CANISTER SUCT 3000ML PPV (MISCELLANEOUS) ×2 IMPLANT
COVER WAND RF STERILE (DRAPES) ×2 IMPLANT
CUFF TOURN SGL QUICK 18X4 (TOURNIQUET CUFF) IMPLANT
CUFF TOURN SGL QUICK 24 (TOURNIQUET CUFF)
CUFF TOURN SGL QUICK 34 (TOURNIQUET CUFF)
CUFF TOURN SGL QUICK 42 (TOURNIQUET CUFF) IMPLANT
CUFF TRNQT CYL 24X4X16.5-23 (TOURNIQUET CUFF) IMPLANT
CUFF TRNQT CYL 34X4.125X (TOURNIQUET CUFF) IMPLANT
DERMABOND ADVANCED (GAUZE/BANDAGES/DRESSINGS) ×2
DERMABOND ADVANCED .7 DNX12 (GAUZE/BANDAGES/DRESSINGS) ×2 IMPLANT
DRAIN CHANNEL 15F RND FF W/TCR (WOUND CARE) IMPLANT
ELECT REM PT RETURN 9FT ADLT (ELECTROSURGICAL) ×2
ELECTRODE REM PT RTRN 9FT ADLT (ELECTROSURGICAL) ×1 IMPLANT
EVACUATOR SILICONE 100CC (DRAIN) IMPLANT
GLOVE BIOGEL PI IND STRL 7.5 (GLOVE) ×1 IMPLANT
GLOVE BIOGEL PI INDICATOR 7.5 (GLOVE) ×1
GLOVE SURG SS PI 7.5 STRL IVOR (GLOVE) ×2 IMPLANT
GOWN STRL REUS W/ TWL LRG LVL3 (GOWN DISPOSABLE) ×1 IMPLANT
GOWN STRL REUS W/ TWL XL LVL3 (GOWN DISPOSABLE) ×3 IMPLANT
GOWN STRL REUS W/TWL LRG LVL3 (GOWN DISPOSABLE) ×1
GOWN STRL REUS W/TWL XL LVL3 (GOWN DISPOSABLE) ×3
HEMOSTAT SNOW SURGICEL 2X4 (HEMOSTASIS) ×2 IMPLANT
KIT BASIN OR (CUSTOM PROCEDURE TRAY) ×2 IMPLANT
KIT TURNOVER KIT B (KITS) ×2 IMPLANT
NS IRRIG 1000ML POUR BTL (IV SOLUTION) ×4 IMPLANT
PACK CV ACCESS (CUSTOM PROCEDURE TRAY) IMPLANT
PACK GENERAL/GYN (CUSTOM PROCEDURE TRAY) IMPLANT
PACK PERIPHERAL VASCULAR (CUSTOM PROCEDURE TRAY) IMPLANT
PACK UNIVERSAL I (CUSTOM PROCEDURE TRAY) IMPLANT
PAD ARMBOARD 7.5X6 YLW CONV (MISCELLANEOUS) ×4 IMPLANT
SPONGE LAP 18X18 RF (DISPOSABLE) ×2 IMPLANT
STAPLER VISISTAT 35W (STAPLE) IMPLANT
SUT MNCRL AB 4-0 PS2 18 (SUTURE) IMPLANT
SUT PROLENE 5 0 C 1 24 (SUTURE) IMPLANT
SUT PROLENE 6 0 BV (SUTURE) IMPLANT
SUT VIC AB 2-0 CT1 27 (SUTURE) ×5
SUT VIC AB 2-0 CT1 TAPERPNT 27 (SUTURE) ×5 IMPLANT
SUT VIC AB 3-0 SH 27 (SUTURE) ×5
SUT VIC AB 3-0 SH 27X BRD (SUTURE) ×5 IMPLANT
SUT VIC AB 4-0 PS2 18 (SUTURE) ×2 IMPLANT
TOWEL GREEN STERILE (TOWEL DISPOSABLE) ×2 IMPLANT
TRAY FOLEY MTR SLVR 16FR STAT (SET/KITS/TRAYS/PACK) IMPLANT
UNDERPAD 30X30 (UNDERPADS AND DIAPERS) ×2 IMPLANT
WATER STERILE IRR 1000ML POUR (IV SOLUTION) ×2 IMPLANT

## 2019-08-14 NOTE — OR Nursing (Signed)
Foley not placed in patient per Dr Linna Caprice. Verified no foley necessary because of dialysis with Dr Trula Slade

## 2019-08-14 NOTE — Progress Notes (Signed)
Patient going to short stay. Heparin drip was stopped per order; report was given to the nurse in short stay. Will continue to monitor.

## 2019-08-14 NOTE — Transfer of Care (Signed)
Immediate Anesthesia Transfer of Care Note  Patient: Kristen Berry  Procedure(s) Performed: EVACUATION HEMATOMA LEFT GROIN (Left ) EXPLORATION OF LEFT FEMORAL -POPLITEAL ARTETRY BYPASS, REPAIR OF LEFT FEMORAL - POPLITEAL ARTERY BYPASS (Left )  Patient Location: PACU  Anesthesia Type:General  Level of Consciousness: awake and patient cooperative  Airway & Oxygen Therapy: Patient Spontanous Breathing  Post-op Assessment: Report given to RN and Post -op Vital signs reviewed and stable  Post vital signs: Reviewed and stable  Last Vitals:  Vitals Value Taken Time  BP 107/32 08/14/19 2048  Temp 36.5 C 08/14/19 2048  Pulse 86 08/14/19 2048  Resp 16 08/14/19 2048  SpO2 97 % 08/14/19 2048    Last Pain:  Vitals:   08/14/19 2048  TempSrc:   PainSc: 0-No pain      Patients Stated Pain Goal: 3 (60/15/61 5379)  Complications: No apparent anesthesia complications

## 2019-08-14 NOTE — Progress Notes (Signed)
At pt's 30 minute assessment could not get her to answer questions or follow commands, skin getting clammy.  Called anesthesia Ola Spurr).  Pt BP started to drop into the 90's/40's. During further assessment noticed that pt's incision sites had hematomas. Could not doppler posterior tib and anterior tib pulses. Applied pressure and called Dr Trula Slade who stated pt would go back to OR and he was 10 minutes away. At this time Dr Ola Spurr returned to bedside, ordered 2 units PRBC's. Albumin given. When Dr Trula Slade came bedside he was able to assess posterior tib pulse with doppler. One unit of blood primed in the PACU, neo drip started and pt was taken to OR.

## 2019-08-14 NOTE — Anesthesia Preprocedure Evaluation (Signed)
Anesthesia Evaluation  Patient identified by MRN, date of birth, ID band Patient confused    Reviewed: Allergy & Precautions, H&P , NPO status , Patient's Chart, lab work & pertinent test results, reviewed documented beta blocker date and time   Airway Mallampati: II  TM Distance: >3 FB Neck ROM: Full    Dental no notable dental hx. (+) Teeth Intact, Dental Advisory Given   Pulmonary asthma , sleep apnea ,    Pulmonary exam normal breath sounds clear to auscultation       Cardiovascular hypertension, Pt. on medications and Pt. on home beta blockers + CAD, + Peripheral Vascular Disease and +CHF   Rhythm:Regular Rate:Normal     Neuro/Psych CVA negative psych ROS   GI/Hepatic negative GI ROS, Neg liver ROS,   Endo/Other  negative endocrine ROSdiabetes  Renal/GU ESRF and DialysisRenal disease  negative genitourinary   Musculoskeletal  (+) Arthritis , Osteoarthritis,    Abdominal   Peds  Hematology  (+) Blood dyscrasia, anemia ,   Anesthesia Other Findings   Reproductive/Obstetrics negative OB ROS                             Anesthesia Physical Anesthesia Plan  ASA: III and emergent  Anesthesia Plan: General   Post-op Pain Management:    Induction: Intravenous  PONV Risk Score and Plan: 4 or greater and Ondansetron and Treatment may vary due to age or medical condition  Airway Management Planned: Oral ETT  Additional Equipment:   Intra-op Plan:   Post-operative Plan: Extubation in OR  Informed Consent: I have reviewed the patients History and Physical, chart, labs and discussed the procedure including the risks, benefits and alternatives for the proposed anesthesia with the patient or authorized representative who has indicated his/her understanding and acceptance.     Dental advisory given  Plan Discussed with: CRNA  Anesthesia Plan Comments:         Anesthesia Quick  Evaluation

## 2019-08-14 NOTE — Transfer of Care (Signed)
222Immediate Anesthesia Transfer of Care Note  Patient: Kristen Berry  Procedure(s) Performed: LEFT FEMORAL-POPLITEAL ARTERY Bypass Graft (Left Leg Upper) AMPUTATION LEFT GREAT TOE (Left Foot) Application Of Wound Vac to left foot (Left )  Patient Location: PACU  Anesthesia Type:General  Level of Consciousness: drowsy  Airway & Oxygen Therapy: Patient Spontanous Breathing  Post-op Assessment: Report given to RN and Post -op Vital signs reviewed and stable  Post vital signs: Reviewed and stable  Last Vitals:  Vitals Value Taken Time  BP 112/48 08/14/19 1757  Temp    Pulse 71 08/14/19 1758  Resp 17 08/14/19 1758  SpO2 94 % 08/14/19 1758  Vitals shown include unvalidated device data.  Last Pain:  Vitals:   08/14/19 0818  TempSrc: Oral  PainSc:       Patients Stated Pain Goal: 3 (85/88/50 2774)  Complications: No apparent anesthesia complications

## 2019-08-14 NOTE — Progress Notes (Signed)
Status post left femoral to above-knee popliteal artery bypass graft with vein and extensive debridement of the left foot.  While in recovery room, the patient became hypotensive and had expanding hematoma within the left groin.  I felt that this was significant enough to require going back to the operating room for exploration.  I tried to contact the son however I was unsuccessful.  Annamarie Major

## 2019-08-14 NOTE — Anesthesia Procedure Notes (Signed)
Procedure Name: Intubation Date/Time: 08/14/2019 7:18 PM Performed by: Shirlyn Goltz, CRNA Pre-anesthesia Checklist: Patient identified, Emergency Drugs available, Suction available and Patient being monitored Patient Re-evaluated:Patient Re-evaluated prior to induction Oxygen Delivery Method: Circle system utilized Preoxygenation: Pre-oxygenation with 100% oxygen Induction Type: IV induction and Rapid sequence Laryngoscope Size: Mac and 4 Grade View: Grade I Tube type: Oral Tube size: 7.0 mm Number of attempts: 1 Airway Equipment and Method: Stylet Placement Confirmation: ETT inserted through vocal cords under direct vision,  positive ETCO2 and breath sounds checked- equal and bilateral Secured at: 21 cm Tube secured with: Tape Dental Injury: Teeth and Oropharynx as per pre-operative assessment

## 2019-08-14 NOTE — Anesthesia Preprocedure Evaluation (Signed)
Anesthesia Evaluation  Patient identified by MRN, date of birth, ID band Patient awake    Reviewed: Allergy & Precautions, NPO status , Patient's Chart, lab work & pertinent test results  Airway Mallampati: II  TM Distance: >3 FB     Dental  (+) Poor Dentition   Pulmonary    breath sounds clear to auscultation       Cardiovascular hypertension,  Rhythm:Regular Rate:Normal     Neuro/Psych    GI/Hepatic   Endo/Other  diabetes  Renal/GU      Musculoskeletal   Abdominal (+) + obese,   Peds  Hematology   Anesthesia Other Findings   Reproductive/Obstetrics                             Anesthesia Physical Anesthesia Plan  ASA: III  Anesthesia Plan: General   Post-op Pain Management:    Induction: Intravenous  PONV Risk Score and Plan: Ondansetron  Airway Management Planned: Oral ETT  Additional Equipment:   Intra-op Plan:   Post-operative Plan:   Informed Consent: I have reviewed the patients History and Physical, chart, labs and discussed the procedure including the risks, benefits and alternatives for the proposed anesthesia with the patient or authorized representative who has indicated his/her understanding and acceptance.     Dental advisory given  Plan Discussed with: CRNA and Anesthesiologist  Anesthesia Plan Comments:         Anesthesia Quick Evaluation

## 2019-08-14 NOTE — Interval H&P Note (Signed)
History and Physical Interval Note:  08/14/2019 1:34 PM  Kristen Berry  has presented today for surgery, with the diagnosis of left leg ischemia.  The various methods of treatment have been discussed with the patient and family. After consideration of risks, benefits and other options for treatment, the patient has consented to  Procedure(s): BYPASS GRAFT FEMORAL-POPLITEAL ARTERY (Left) AMPUTATION LEFT GREAT TOE (Left) as a surgical intervention.  The patient's history has been reviewed, patient examined, no change in status, stable for surgery.  I have reviewed the patient's chart and labs.  Questions were answered to the patient's satisfaction.     Annamarie Major

## 2019-08-14 NOTE — Progress Notes (Signed)
ANTICOAGULATION CONSULT NOTE - Follow Up Consult  Pharmacy Consult for Heparin Indication: h/o DVT   Allergies  Allergen Reactions  . Crestor [Rosuvastatin Calcium] Other (See Comments)    Leg pain    Patient Measurements: Height: 5\' 9"  (175.3 cm) Weight: 176 lb 5.9 oz (80 kg) IBW/kg (Calculated) : 66.2 Heparin Dosing Weight:   Vital Signs: Temp: 98.5 F (36.9 C) (11/17 0818) Temp Source: Oral (11/17 0818) BP: 129/57 (11/17 0818) Pulse Rate: 88 (11/17 0818)  Labs: Recent Labs    08/12/19 0714 08/12/19 1356 08/13/19 0237 08/14/19 0249  HGB 9.5*  --  9.5* 9.4*  HCT 29.7*  --  29.8* 29.7*  PLT 525*  --  549* 513*  HEPARINUNFRC 0.50 0.62 0.52 0.56  CREATININE 4.66*  --  5.05* 2.68*    Estimated Creatinine Clearance: 24 mL/min (A) (by C-G formula based on SCr of 2.68 mg/dL (H)).   Assessment: Anticoag: apix PTA for hx DVT on hold for procedure (LD 11/8 PM) >> heparin IV, HL 0.56 in goal. CBC stable.   Goal of Therapy:  Heparin level 0.3-0.7 units/ml Monitor platelets by anticoagulation protocol: Yes   Plan:  Continue heparin at 2200 units/hr. Stop on call to OR 11/17. Daily HL, CBC, monitor bleeding  Left femoral popliteal bypass with vein and left great toe amputation today. F/u to resume post-op   Kristen Berry, PharmD, BCPS Clinical Staff Pharmacist [phone] Kristen Berry 08/14/2019,8:53 AM

## 2019-08-14 NOTE — Anesthesia Procedure Notes (Signed)
Performed by: Artha Stavros M, CRNA       

## 2019-08-14 NOTE — Anesthesia Postprocedure Evaluation (Signed)
Anesthesia Post Note  Patient: Kristen Berry  Procedure(s) Performed: EVACUATION HEMATOMA LEFT GROIN (Left ) EXPLORATION OF LEFT FEMORAL -POPLITEAL ARTETRY BYPASS, REPAIR OF LEFT FEMORAL - POPLITEAL ARTERY BYPASS (Left )     Patient location during evaluation: PACU Anesthesia Type: General Level of consciousness: awake and alert Pain management: pain level controlled Vital Signs Assessment: post-procedure vital signs reviewed and stable Respiratory status: spontaneous breathing, nonlabored ventilation and respiratory function stable Cardiovascular status: blood pressure returned to baseline Postop Assessment: no apparent nausea or vomiting Anesthetic complications: no    Last Vitals:  Vitals:   08/14/19 2130 08/14/19 2145  BP: (!) 116/45 (!) 128/40  Pulse: 86 87  Resp: 15 12  Temp:    SpO2: 97% 97%    Last Pain:  Vitals:   08/14/19 2145  TempSrc:   PainSc: 0-No pain                 Jos Cygan,W. EDMOND

## 2019-08-14 NOTE — Progress Notes (Signed)
PROGRESS NOTE    Kristen Berry  BJY:[id number] DOB: 12-Jul-1956 DOA: 08/06/2019 PCP: [place], Sarah T, MD   Brief Narrative:  HPI On 11/10/202 by Dr. Gean Birchwood Kristen Berry is a 63 y.o. female with history of ESRD on hemodialysis on Monday Wednesday Friday, peripheral vascular disease status post stenting, history of DVT, anemia presents to the ER after patient has been noticing increasing swelling of the left foot increasing discharge and gangrene of the great toe noticed over the last 2 weeks.  Patient was admitted last month for cellulitis involving the left foot and great toe.  Patient states over the last 1 week patient missed 3 sessions of her dialysis due to transport issues.  Patient is mildly short of breath but not in distress.  Has noticed increasing swelling of the left foot with increasing discharge mostly from the left great toe which has become gangrenous of the last 2 weeks.   Interim history  Admitted with sepsis secondary to ischemia of the left foot/gangrene.  Orthopedics and vascular surgery consulted and appreciated.  Patient also ESRD patient, nephrology consulted. Assessment & Plan   Sepsis secondary to diabetic insensate neuropathy with critical limb ischemia of the left foot/great toe with cellulitis, osteomyelitis, gangrene -On admission, patient presented with leukocytosis, tachycardia, tachypnea -Orthopedic surgery consulted and appreciated.  He discussed options of possible revascularization versus trans to be amputation.  Patient declined amputation at this time. -Vascular surgery consulted and appreciated, planning for surgery today and possibly some type of surgery on her fistula on 08/16/2019 -Currently on IV vancomycin and Zosyn  ESRD -Patient dialyzes Monday, Wednesday, Friday -Patient missed 1 week of hemodialysis due to living alone and her daughter moved out.  She had lack of reliable transportation due to wheelchair dependence. -Social work  consulted -Nephrology consulted and appreciated  Hypokalemia -Potassium currently 3.8 -magnesium 1.9  Essential hypertension -BP controlled  -Continue Coreg and hydralazine.  Amlodipine currently held -Continue volume management with hemodialysis  Anemia of chronic disease -Hemoglobin 9.4 today, continue to monitor CBC  History of DVT -Eliquis currently held due to possible procedure -Continue IV heparin  Diabetes mellitus, type II -Diet controlled -CBGs currently stable   Thrombocytosis -Likely reactive to sepsis, continue to monitor CBC -Platelet number trending downward, currently 513 (was 965) -Oncology consulted and appreciated, discussed with Dr. Alvy Bimler, felt that this is likely due to sepsis.  Patient can follow-up in the outpatient setting.  Hypokalemia -Resolved, continue to monitor BMP  History of CVA -currently on heparin, statin  Status post right BKA  DVT Prophylaxis Heparin  Code Status: Full  Family Communication: None at bedside  Disposition Plan: Admitted.  Pending surgery today 08/14/2019 by vascular surgery. Dispo TBD.  Consultants Orthopedic surgery Nephrology Vascular surgery  Procedures  None  Antibiotics   Anti-infectives (From admission, onward)   Start     Dose/Rate Route Frequency Ordered Stop   08/10/19 0922  Vancomycin (VANCOCIN) 750-5 MG/150ML-% IVPB    Note to Pharmacy: Murriel Hopper   : cabinet override      08/10/19 0922 08/10/19 2129   08/08/19 1914  Vancomycin (VANCOCIN) 750-5 MG/150ML-% IVPB    Note to Pharmacy: Cherylann Banas   : cabinet override      08/08/19 1914 08/08/19 2111   08/08/19 1200  [MAR Hold]  vancomycin (VANCOCIN) IVPB 750 mg/150 ml premix     (MAR Hold since Tue 08/14/2019 at 1050.Hold Reason: Transfer to a Procedural area.)   750 mg 150 mL/hr over  60 Minutes Intravenous Every M-W-F (Hemodialysis) 08/06/19 2229     08/07/19 1930  vancomycin (VANCOCIN) IVPB 750 mg/150 ml premix     750 mg 150 mL/hr  over 60 Minutes Intravenous  Once 08/07/19 1844 08/07/19 2135   08/07/19 0830  [MAR Hold]  piperacillin-tazobactam (ZOSYN) IVPB 2.25 g     (MAR Hold since Tue 08/14/2019 at 1050.Hold Reason: Transfer to a Procedural area.)   2.25 g 100 mL/hr over 30 Minutes Intravenous Every 8 hours 08/07/19 0812     08/07/19 0800  piperacillin-tazobactam (ZOSYN) IVPB 3.375 g  Status:  Discontinued     3.375 g 12.5 mL/hr over 240 Minutes Intravenous Every 12 hours 08/07/19 0303 08/07/19 0812   08/06/19 2230  piperacillin-tazobactam (ZOSYN) IVPB 3.375 g     3.375 g 100 mL/hr over 30 Minutes Intravenous  Once 08/06/19 2222 08/06/19 2342   08/06/19 2230  vancomycin (VANCOCIN) 1,750 mg in sodium chloride 0.9 % 500 mL IVPB     1,750 mg 250 mL/hr over 120 Minutes Intravenous  Once 08/06/19 2226 08/07/19 0429      Subjective:   [place] seen and examined today.  Patient feels sleepy this morning.  Ready for surgery today.  Denies current chest pain or shortness of breath, dizziness or headache. Objective:   Vitals:   08/14/19 0431 08/14/19 0524 08/14/19 0739 08/14/19 0818  BP:   (!) 133/56 (!) 129/57  Pulse: 91 91 88 88  Resp: 12 13 10 11   Temp:   97.9 F (36.6 C) 98.5 F (36.9 C)  TempSrc:   Oral Oral  SpO2: 97% 96% 98% 98%  Weight:      Height:        Intake/Output Summary (Last 24 hours) at 08/14/2019 1058 Last data filed at 08/14/2019 1026 Gross per 24 hour  Intake 1293.35 ml  Output 2500 ml  Net -1206.65 ml   Filed Weights   08/13/19 1222 08/13/19 1640 08/14/19 0418  Weight: 87.2 kg 85 kg 80 kg   Exam  General: Well developed, well nourished, NAD, appears stated age  33: NCAT, mucous membranes moist.   Cardiovascular: S1 S2 auscultated, RRR, no murmur  Respiratory: Clear to auscultation bilaterally   Abdomen: Soft, nontender, nondistended, + bowel sounds  Extremities: R BKA, left great toe gangrene   Neuro: AAOx3, nonfocal  Psych: Appropriate mood and affect, pleasant    Data Reviewed: I have personally reviewed following labs and imaging studies  CBC: Recent Labs  Lab 08/10/19 0223 08/11/19 0258 08/12/19 0714 08/13/19 0237 08/14/19 0249  WBC 27.4* 25.1* 26.2* 25.9* 24.5*  HGB 9.8* 9.9* 9.5* 9.5* 9.4*  HCT 31.1* 31.2* 29.7* 29.8* 29.7*  MCV 94.8 93.7 94.9 95.5 96.1  PLT 708* 616* 525* 549* 283*   Basic Metabolic Panel: Recent Labs  Lab 08/09/19 0231 08/10/19 0223 08/11/19 0258 08/12/19 0714 08/13/19 0237 08/14/19 0249  NA 134* 129* 131* 132* 131* 135  K 3.7 3.5 2.8* 3.6 3.8 3.8  CL 95* 90* 92* 93* 94* 96*  CO2 22 21* 23 22 21* 24  GLUCOSE 130* 114* 143* 185* 152* 148*  BUN 17 32* 17 34* 43* 12  CREATININE 2.68* 3.79* 3.07* 4.66* 5.05* 2.68*  CALCIUM 8.1* 8.0* 8.2* 8.4* 8.5* 8.4*  MG  --   --  1.9  --   --   --   PHOS 2.8 3.7 2.5  --  3.0 2.6   GFR: Estimated Creatinine Clearance: 24 mL/min (A) (by C-G formula based on SCr  of 2.68 mg/dL (H)). Liver Function Tests: Recent Labs  Lab 08/09/19 0231 08/10/19 0223 08/11/19 0258 08/13/19 0237 08/14/19 0249  ALBUMIN 2.5* 2.3* 2.2* 2.1* 2.1*   No results for input(s): LIPASE, AMYLASE in the last 168 hours. No results for input(s): AMMONIA in the last 168 hours. Coagulation Profile: No results for input(s): INR, PROTIME in the last 168 hours. Cardiac Enzymes: No results for input(s): CKTOTAL, CKMB, CKMBINDEX, TROPONINI in the last 168 hours. BNP (last 3 results) No results for input(s): PROBNP in the last 8760 hours. HbA1C: No results for input(s): HGBA1C in the last 72 hours. CBG: Recent Labs  Lab 08/13/19 1736 08/13/19 2024 08/14/19 0019 08/14/19 0425 08/14/19 0843  GLUCAP 135* 171* 141* 119* 133*   Lipid Profile: No results for input(s): CHOL, HDL, LDLCALC, TRIG, CHOLHDL, LDLDIRECT in the last 72 hours. Thyroid Function Tests: No results for input(s): TSH, T4TOTAL, FREET4, T3FREE, THYROIDAB in the last 72 hours. Anemia Panel: No results for input(s): VITAMINB12,  FOLATE, FERRITIN, TIBC, IRON, RETICCTPCT in the last 72 hours. Urine analysis:    Component Value Date/Time   COLORURINE YELLOW 10/17/2017 1402   APPEARANCEUR HAZY (A) 10/17/2017 1402   LABSPEC 1.013 10/17/2017 1402   PHURINE 5.0 10/17/2017 1402   GLUCOSEU NEGATIVE 10/17/2017 1402   HGBUR NEGATIVE 10/17/2017 1402   BILIRUBINUR NEGATIVE 10/17/2017 1402   KETONESUR 5 (A) 10/17/2017 1402   PROTEINUR NEGATIVE 10/17/2017 1402   NITRITE NEGATIVE 10/17/2017 1402   LEUKOCYTESUR NEGATIVE 10/17/2017 1402   Sepsis Labs: @LABRCNTIP (procalcitonin:4,lacticidven:4)  ) Recent Results (from the past 240 hour(s))  Blood culture (routine x 2)     Status: None   Collection Time: 08/06/19  8:24 PM   Specimen: BLOOD  Result Value Ref Range Status   Specimen Description BLOOD RIGHT ANTECUBITAL  Final   Special Requests   Final    BOTTLES DRAWN AEROBIC AND ANAEROBIC Blood Culture adequate volume   Culture   Final    NO GROWTH 5 DAYS Performed at [facility], [place] [address], [place], [place] [address]    Report Status 08/11/2019 FINAL  Final  Blood culture (routine x 2)     Status: None   Collection Time: 08/06/19  9:44 PM   Specimen: BLOOD RIGHT HAND  Result Value Ref Range Status   Specimen Description BLOOD RIGHT HAND  Final   Special Requests   Final    BOTTLES DRAWN AEROBIC ONLY Blood Culture results may not be optimal due to an inadequate volume of blood received in culture bottles   Culture   Final    NO GROWTH 5 DAYS Performed at [facility], [place] [address], [place], [place] [address]    Report Status 08/11/2019 FINAL  Final  SARS CORONAVIRUS 2 (TAT 6-24 HRS) Nasopharyngeal Nasopharyngeal Swab     Status: None   Collection Time: 08/06/19 11:05 PM   Specimen: Nasopharyngeal Swab  Result Value Ref Range Status   SARS Coronavirus 2 NEGATIVE NEGATIVE Final    Comment: (NOTE) SARS-CoV-2 target nucleic acids are NOT DETECTED. The SARS-CoV-2 RNA is generally detectable in  upper and lower respiratory specimens during the acute phase of infection. Negative results do not preclude SARS-CoV-2 infection, do not rule out co-infections with other pathogens, and should not be used as the sole basis for treatment or other patient management decisions. Negative results must be combined with clinical observations, patient history, and epidemiological information. The expected result is Negative. Fact Sheet for Patients: SugarRoll.be Fact Sheet  for Healthcare Providers: https://www.woods-mathews.com/ This test is not yet approved or cleared by the [place] and  has been authorized for detection and/or diagnosis of SARS-CoV-2 by FDA under an Emergency Use Authorization (EUA). This EUA will remain  in effect (meaning this test can be used) for the duration of the COVID-19 declaration under Section 56 4(b)(1) of the Act, 21 U.S.C. section 360bbb-3(b)(1), unless the authorization is terminated or revoked sooner. Performed at [facility], [place] [address], [place], [place] [address]   MRSA PCR Screening     Status: Abnormal   Collection Time: 08/07/19  8:26 PM   Specimen: Nasal Mucosa; Nasopharyngeal  Result Value Ref Range Status   MRSA by PCR POSITIVE (A) NEGATIVE Final    Comment:        The GeneXpert MRSA Assay (FDA approved for NASAL specimens only), is one component of a comprehensive MRSA colonization surveillance program. It is not intended to diagnose MRSA infection nor to guide or monitor treatment for MRSA infections. RESULT CALLED TO, READ BACK BY AND VERIFIED WITH: Dorena Bodo RN 08/07/19 2221 JDW Performed at [facility], [place] [address], [place], [place] [address]   Surgical PCR screen     Status: Abnormal   Collection Time: 08/13/19 10:15 AM   Specimen: Nasal Mucosa; Nasal Swab  Result Value Ref Range Status   MRSA, PCR POSITIVE (A) NEGATIVE Final    Comment: RESULT CALLED TO, READ  BACK BY AND VERIFIED WITH: Jefferson Fuel RN 12:10 08/13/19 ([email or web address])    Staphylococcus aureus POSITIVE (A) NEGATIVE Final    Comment: (NOTE) The Xpert SA Assay (FDA approved for NASAL specimens in patients 52 years of age and older), is one component of a comprehensive surveillance program. It is not intended to diagnose infection nor to guide or monitor treatment. Performed at [facility], [place] [address], [place], [place] [address]       Radiology Studies: No results found.   Scheduled Meds: . [MAR Hold] atorvastatin  20 mg Oral QHS  . [MAR Hold] carvedilol  12.5 mg Oral BID  . [MAR Hold] Chlorhexidine Gluconate Cloth  6 each Topical Q0600  . [MAR Hold] Chlorhexidine Gluconate Cloth  6 each Topical Q0600  . [MAR Hold] darbepoetin (ARANESP) injection - DIALYSIS  40 mcg Intravenous Q Fri-HD  . [MAR Hold] mupirocin ointment   Nasal BID  . [MAR Hold] pantoprazole  40 mg Oral Daily  . [MAR Hold] saccharomyces boulardii  250 mg Oral QHS   Continuous Infusions: . sodium chloride    . heparin Stopped (08/14/19 1026)  . [MAR Hold] piperacillin-tazobactam (ZOSYN)  IV 2.25 g (08/14/19 0510)  . [MAR Hold] vancomycin 750 mg (08/13/19 1539)     LOS: 8 days   Time Spent in minutes   30 minutes  Roxanna Mcever D.O. on 08/14/2019 at 10:58 AM  Between 7am to 7pm - Please see pager noted on [email or web address]  After 7pm go to [email or web address]  And look for the night coverage person covering for me after hours  [facility] Office  [phone]

## 2019-08-14 NOTE — Anesthesia Procedure Notes (Signed)
Procedure Name: Intubation Date/Time: 08/14/2019 1:28 PM Performed by: Scheryl Darter, CRNA Pre-anesthesia Checklist: Patient identified, Emergency Drugs available, Suction available and Patient being monitored Patient Re-evaluated:Patient Re-evaluated prior to induction Oxygen Delivery Method: Circle System Utilized Preoxygenation: Pre-oxygenation with 100% oxygen Induction Type: IV induction Ventilation: Mask ventilation without difficulty Laryngoscope Size: Mac and 3 Grade View: Grade I Tube type: Oral Tube size: 7.5 mm Number of attempts: 1 Airway Equipment and Method: Stylet and Oral airway Placement Confirmation: ETT inserted through vocal cords under direct vision,  positive ETCO2 and breath sounds checked- equal and bilateral Secured at: 22 cm Tube secured with: Tape Dental Injury: Teeth and Oropharynx as per pre-operative assessment

## 2019-08-14 NOTE — Op Note (Signed)
    Patient name: Kristen Berry MRN: [id number] DOB: 11-12-1955 Sex: female  08/14/2019 Pre-operative Diagnosis: Bleeding post fem pop bypass Post-operative diagnosis:  Same Surgeon:  Annamarie Major Assistants:  none Procedure:   #1 exploration of recent femoral popliteal bypass   #2  Repair of bleeding from vein bypass   #3  Evacuation of hematoma Anesthesia:  general Blood Loss:  600cc Specimens:  none  Findings:  A tie came off of the hood of the vein graft which was repaired with a 5-0 prolene  Indications: Earlier today, the patient underwent a left femoral to above-knee popliteal artery bypass graft with vein.  Once in the PACU she became hypotensive and developed a hematoma in the left groin.  This necessitated emergent return to the operating room for hematoma evacuation and exploration.  I have tried multiple times to contact her son her primary contact but have been unsuccessful.  Procedure:  The patient was identified in the holding area and taken to [place] [facility]  The patient was then placed supine on the table. general anesthesia was administered.  The patient was prepped and draped in the usual sterile fashion.  A time out was called and antibiotics were administered.  The patient's previous oblique groin incision was opened with a 10 blade.  Upon immediate entry into the wound, a large hematoma was encountered and evacuated.  There was brisk bleeding coming from the hood of the vein graft.  I controlled this by placing a Cooley J clamp across the anastomosis.  A tie had come off of one of the branches.  I repaired this with a 5-0 Prolene U stitch.  I ended up opening all of her incisions as the hematoma had tracked down the vein harvest site.  A total of 600 cc of blood was evacuated.  All the wounds were irrigated.  No further bleeding was encountered.  I then closed the incisions with multiple layers of 3-0 Vicryl and Dermabond.  The patient had a brisk posterior tibial and anterior  tibial Doppler signal at the conclusion.  She was successfully extubated and taken the recovery room in stable condition.   Disposition: To PACU stable   V. Annamarie Major, M.D., [facility] [place] Office: [phone] Pager:  [phone]

## 2019-08-14 NOTE — Op Note (Signed)
Patient name: Kristen Berry MRN: [id number] DOB: [birth year] Sex: female  08/14/2019 Pre-operative Diagnosis: Left foot ulcer Post-operative diagnosis:  Same Surgeon:  Annamarie Major Assistants: Arlee Muslim, Ellsworth Lennox, Delena Serve Procedure:   #1: Left femoral to above-knee popliteal artery bypass graft with ipsilateral translocated nonreversed saphenous vein   #2: Exposure of left below-knee popliteal artery   #3: Amputation of left great to an extensive debridement of skin, subtenons tissue, muscle and tendon on the left foot   #4: Wound VAC placement   #5: Popliteal artery endarterectomy, left Anesthesia: General Blood Loss: 150 cc Specimens: None  Findings: She had an excellent vein approximately 5 mm throughout.  I initially felt that I could do a above-knee popliteal bypass graft.  I exposed the above-knee popliteal artery and it was calcified with some soft areas.  I then exposed the below-knee popliteal artery because it looked like a better target on angiogram however when I exposed, it was much more calcified and so I elected to go to the above-knee popliteal artery.  The patient had extensive tissue loss on the plantar surface of her foot which required extensive debridement including amputation of the great toe.  Indications: The patient has undergone multiple procedures on her left leg and now has a necrotic big toe.  She was a candidate for bypass which she consented for.  The risk benefits were discussed with her.  Procedure:  The patient was identified in the holding area and taken to Bee 10  The patient was then placed supine on the table. general anesthesia was administered.  The patient was prepped and draped in the usual sterile fashion.  A time out was called and antibiotics were administered.  An oblique incision was made in the left groin.  Cautery was used about subcutaneous tissue.  There is a fair amount of scar tissue in the groin.  Ultimately exposed  the common femoral artery beginning at the inguinal ligament down to the bifurcation.  It was a relatively healthy artery.  I then dissected out the saphenous vein through multiple skip incisions.  The distal incision above the knee, I also exposed the above-knee popliteal artery behind the patella.  This had some soft areas but a fair amount of plaque and so I elected to expose the below-knee popliteal artery because on angiogram this appeared to be a healthy artery.  This was done through a medial below-knee incision.  Once I entered the popliteal space and dissected out the popliteal artery, it was much more heavily calcified in the above-knee artery and so I elected to use the above-knee popliteal artery as the distal target.  Next, a long Gore tunneler was used to create a subsartorial tunnel.  The vein was ligated proximally distally with silk ties.  It was then prepared on the back table.  It distended nicely.  It was measuring at least 5 mm throughout.  The patient was then fully heparinized.  After the heparin circulated, the common femoral artery was occluded with vascular clamps and opened longitudinally with a #11 blade and Potts scissors.  The vein was spatulated and a end-to-side anastomosis was created with 5-0 Prolene.  Once this was completed, the clamps were released.  I then used a valvulotome to lyse the valves.  2 passes were performed.  There was excellent pulsatile flow through the bypass graft.  The bypass was then brought through the previously created tunnel making sure to maintain proper orientation.  I  then occluded the above-knee popliteal artery with vascular clamps.  A #11 blade was used to make an arteriotomy which was extended longitudinally with Potts scissors.  I did perform endarterectomy of the above-knee popliteal artery, achieving a good distal endpoint.  The vein was then cut the appropriate length and spatulated to fit the size the arteriotomy.  A running anastomosis was  created with 6-0 Prolene.  Prior to completion the appropriate flushing maneuvers were performed and the anastomosis was completed.  Clamps were released.  There was excellent pulsatile flow through the graft and graft dependent brisk posterior tibial and anterior tibial Doppler signals of the ankle.  50 mg of protamine was administered.  Wounds were irrigated.  Hemostasis was achieved.  The vein incisions were closed with 2 layers of 3-0 Vicryl.  The groin incision was closed by reapproximating the femoral sheath with 2-0 Vicryl in the subcutaneous tissue with additional layers of 3-0 Vicryl and 4-0 Vicryl and skin.  The medial and above-knee popliteal exposure incisions were closed by reapproximating the fascia with 2-0 Vicryl and subcutaneous tissue with 3-0 Vicryl followed by 4-0 Vicryl and skin.  Dermabond was applied.  Attention was then turned towards the left foot.  A #10 blade was used to create a racquet type incision around the great toe.  The underlying tissue had extensive abscesses in the deep spaces.  I ended up having to remove a significant portion of the plantar skin on the foot in order to remove all of the necrotic tissue.  I also removed the metatarsal head of the second toe.  I was able to remove all necrotic tissue and have good capillary bleeding however there was extensive tissue loss required.  This was then irrigated.  A wound VAC was placed.  The patient was extubated taken recovery in stable condition.   Disposition: To PACU stable to PACU stable   V. Annamarie Major, M.D., [facility] [place] Office: [phone] Pager:  [phone]

## 2019-08-14 NOTE — Progress Notes (Addendum)
Subjective:  No current cos, said  Tolerated HD  yest   /"my surgery is for 11 am  today"   Objective Vital signs in last 24 hours: Vitals:   08/14/19 0431 08/14/19 0524 08/14/19 0739 08/14/19 0818  BP:   (!) 133/56 (!) 129/57  Pulse: 91 91 88 88  Resp: 12 13 10 11   Temp:   97.9 F (36.6 C) 98.5 F (36.9 C)  TempSrc:   Oral Oral  SpO2: 97% 96% 98% 98%  Weight:      Height:       Weight change: 1.4 kg  Physical Exam: General- Alert elderly Female/ nad Chest-clear bilat Card-RRR no MRG Abd- Obese, Bs +,soft nt nd  Extrem-R BKA,no pretibedema, L foot with wound VAC, 4-5 skip incisions in the left leg s/p bypass HD Access-R TDC, LUA AVF+bruit   Home meds: - amlodipine 5/ carvedilol 25 bid/ hydralazine 25 bid - apixaban 2.5 bid - atorvastatin 20 hs/ ticagrelor 90 bid - pantoprazole 40 hs/ calc acetate ac tid - prn's/ vitamins/ supplements  Outpt HD:MWF [facility]  4h 350/800 80kg 2/2.25 bath TDC Heparin 2400 - LUE AVF (poorly matured, not usingUNTIL LOWER EXTREM Infect/ PAD issue stable ) - calc 0.25 tiw  Problem/Plan: 1. GangreneLeftFoot.- On Zosyn, Vanco/VVS = L grt toes amp /revasc procedure today 2. ESRD - MWF HD.  HD tomorrow.  Noted Pt missed one week HD, living alone since dtr moved out and doesn't have reliable transportation due to [place] dependence. Consulting renal SW./  3. Hypokalemia = k 2.8 ( 11/14) >3.8 today  11/14 given po supplement/ Diet liberalized  Also /  Fu k labs ^ k bath next hd 4. HTN/ vol -BP's soft,havelowered mostBP meds/ fu wts  bp for lower edw at dc  5. Anemia ckd - Hb 9.4 started Aranesp 40Hd 11/13/ HO TFS 15% Feritin 208 =07/25/19 Op labs hold iron with infection /gangr issue 6. Secondary Hyperparathyroidism - po vit d On Hd Corec Ca 9.4 Phos2.6was On Ca acetate  dc with phos trend down/ Fu labs  7. H/o CVA\ 8. Chron anticoag - was on eliquis,ho dvt Per op [facility]  history  Ernest Haber, PA-C [place] [phone] 08/14/2019,9:59 AM  LOS: 8 days   I have seen and examined this patient and agree with the plan of care. Pt is s/p revasc with fem/pop bypass on the left leg with wound vac on the left foot as well just out of the OR 30 min ago. She is breathing comfortably but very somnolent; arousable to painful stimuli. Plan to HD in the AM; no acute indication tonight.  Dwana Melena, MD 08/14/2019, 6:38 PM   Labs: Basic Metabolic Panel: Recent Labs  Lab 08/11/19 0258 08/12/19 0714 08/13/19 0237 08/14/19 0249  NA 131* 132* 131* 135  K 2.8* 3.6 3.8 3.8  CL 92* 93* 94* 96*  CO2 23 22 21* 24  GLUCOSE 143* 185* 152* 148*  BUN 17 34* 43* 12  CREATININE 3.07* 4.66* 5.05* 2.68*  CALCIUM 8.2* 8.4* 8.5* 8.4*  PHOS 2.5  --  3.0 2.6   Liver Function Tests: Recent Labs  Lab 08/11/19 0258 08/13/19 0237 08/14/19 0249  ALBUMIN 2.2* 2.1* 2.1*   No results for input(s): LIPASE, AMYLASE in the last 168 hours. No results for input(s): AMMONIA in the last 168 hours. CBC: Recent Labs  Lab 08/10/19 0223 08/11/19 0258 08/12/19 0714 08/13/19 0237 08/14/19 0249  WBC 27.4* 25.1* 26.2* 25.9* 24.5*  HGB 9.8* 9.9* 9.5* 9.5* 9.4*  HCT 31.1* 31.2* 29.7* 29.8* 29.7*  MCV 94.8 93.7 94.9 95.5 96.1  PLT 708* 616* 525* 549* 513*   Cardiac Enzymes: No results for input(s): CKTOTAL, CKMB, CKMBINDEX, TROPONINI in the last 168 hours. CBG: Recent Labs  Lab 08/13/19 1736 08/13/19 2024 08/14/19 0019 08/14/19 0425 08/14/19 0843  GLUCAP 135* 171* 141* 119* 133*    Studies/Results: No results found. Medications: . heparin 2,200 Units/hr (08/14/19 0515)  . piperacillin-tazobactam (ZOSYN)  IV 2.25 g (08/14/19 0510)  . vancomycin 750 mg (08/13/19 1539)   . atorvastatin  20 mg Oral QHS  . carvedilol  12.5 mg Oral BID  . Chlorhexidine Gluconate Cloth  6 each Topical Q0600  . Chlorhexidine Gluconate Cloth  6 each Topical Q0600   . darbepoetin (ARANESP) injection - DIALYSIS  40 mcg Intravenous Q Fri-HD  . mupirocin ointment   Nasal BID  . pantoprazole  40 mg Oral Daily  . saccharomyces boulardii  250 mg Oral QHS

## 2019-08-15 ENCOUNTER — Inpatient Hospital Stay (INDEPENDENT_AMBULATORY_CARE_PROVIDER_SITE_OTHER): Payer: Medicare HMO

## 2019-08-15 ENCOUNTER — Encounter (HOSPITAL_COMMUNITY): Payer: Self-pay | Admitting: Surgery

## 2019-08-15 DIAGNOSIS — I739 Peripheral vascular disease, unspecified: Secondary | ICD-10-CM

## 2019-08-15 LAB — RENAL FUNCTION PANEL
Albumin: 2.5 g/dL — ABNORMAL LOW (ref 3.5–5.0)
Anion gap: 14 (ref 5–15)
BUN: 25 mg/dL — ABNORMAL HIGH (ref 8–23)
CO2: 20 mmol/L — ABNORMAL LOW (ref 22–32)
Calcium: 7.7 mg/dL — ABNORMAL LOW (ref 8.9–10.3)
Chloride: 101 mmol/L (ref 98–111)
Creatinine, Ser: 3.8 mg/dL — ABNORMAL HIGH (ref 0.44–1.00)
GFR calc Af Amer: 14 mL/min — ABNORMAL LOW (ref >60)
GFR calc non Af Amer: 12 mL/min — ABNORMAL LOW (ref >60)
Glucose, Bld: 181 mg/dL — ABNORMAL HIGH (ref 70–99)
Phosphorus: 5.1 mg/dL — ABNORMAL HIGH (ref 2.5–4.6)
Potassium: 4.6 mmol/L (ref 3.5–5.1)
Sodium: 135 mmol/L (ref 135–145)

## 2019-08-15 LAB — GLUCOSE, CAPILLARY
Glucose-Capillary: 130 mg/dL — ABNORMAL HIGH (ref 70–99)
Glucose-Capillary: 131 mg/dL — ABNORMAL HIGH (ref 70–99)
Glucose-Capillary: 146 mg/dL — ABNORMAL HIGH (ref 70–99)

## 2019-08-15 LAB — CBC
HCT: 29.5 % — ABNORMAL LOW (ref 36.0–46.0)
Hemoglobin: 9.7 g/dL — ABNORMAL LOW (ref 12.0–15.0)
MCH: 31.1 pg (ref 26.0–34.0)
MCHC: 32.9 g/dL (ref 30.0–36.0)
MCV: 94.6 fL (ref 80.0–100.0)
Platelets: 435 10*3/uL — ABNORMAL HIGH (ref 150–400)
RBC: 3.12 MIL/uL — ABNORMAL LOW (ref 3.87–5.11)
RDW: 17.4 % — ABNORMAL HIGH (ref 11.5–15.5)
WBC: 31.7 10*3/uL — ABNORMAL HIGH (ref 4.0–10.5)
nRBC: 0 % (ref 0.0–0.2)

## 2019-08-15 LAB — HEMOGLOBIN AND HEMATOCRIT, BLOOD
HCT: 23.8 % — ABNORMAL LOW (ref 36.0–46.0)
Hemoglobin: 7.6 g/dL — ABNORMAL LOW (ref 12.0–15.0)

## 2019-08-15 LAB — PREPARE RBC (CROSSMATCH)

## 2019-08-15 LAB — HEPARIN LEVEL (UNFRACTIONATED): Heparin Unfractionated: 1.4 IU/mL — ABNORMAL HIGH (ref 0.30–0.70)

## 2019-08-15 MED ORDER — HEPARIN (PORCINE) 25000 UT/250ML-% IV SOLN
2300.0000 [IU]/h | INTRAVENOUS | Status: DC
  Administered 2019-08-15: 21:00:00 1900 [IU]/h via INTRAVENOUS
  Administered 2019-08-16: 2050 [IU]/h via INTRAVENOUS
  Administered 2019-08-16: 1900 [IU]/h via INTRAVENOUS
  Administered 2019-08-17 – 2019-08-20 (×5): 2200 [IU]/h via INTRAVENOUS
  Filled 2019-08-15 (×8): qty 250

## 2019-08-15 MED ORDER — HEPARIN (PORCINE) 25000 UT/250ML-% IV SOLN
2100.0000 [IU]/h | INTRAVENOUS | Status: DC
  Administered 2019-08-15: 2100 [IU]/h via INTRAVENOUS
  Filled 2019-08-15: qty 250

## 2019-08-15 MED ORDER — CALCITRIOL 0.25 MCG PO CAPS
0.2500 ug | ORAL_CAPSULE | ORAL | Status: DC
  Administered 2019-08-19 – 2019-08-29 (×4): 0.25 ug via ORAL
  Filled 2019-08-15 (×3): qty 1

## 2019-08-15 MED ORDER — SODIUM CHLORIDE 0.9% IV SOLUTION
Freq: Once | INTRAVENOUS | Status: AC
  Administered 2019-08-15: 22:00:00 via INTRAVENOUS

## 2019-08-15 NOTE — Progress Notes (Signed)
  Progress Note    08/15/2019 7:39 AM 1 Day Post-Op  Subjective:  Somnolent this morning   Vitals:   08/15/19 0032 08/15/19 0240  BP: (!) 109/58 (!) 120/51  Pulse: (!) 102 96  Resp: 18   Temp: 97.7 F (36.5 C) (!) 97.4 F (36.3 C)  SpO2: 97% 98%   Physical Exam: Lungs:  Non labored Incisions:   Incisions of LLE c/d/i; L foot vac with good seal Extremities:  L PTA by doppler; soft ATA upper ankle Neurologic: A&O  CBC    Component Value Date/Time   WBC 31.7 (H) 08/15/2019 0315   RBC 3.12 (L) 08/15/2019 0315   HGB 9.7 (L) 08/15/2019 0315   HGB 14.3 12/14/2016 1139   HCT 29.5 (L) 08/15/2019 0315   HCT 44.2 12/14/2016 1139   PLT 435 (H) 08/15/2019 0315   PLT 429 (H) 12/14/2016 1139   MCV 94.6 08/15/2019 0315   MCV 88.5 12/14/2016 1139   MCH 31.1 08/15/2019 0315   MCHC 32.9 08/15/2019 0315   RDW 17.4 (H) 08/15/2019 0315   RDW 17.6 (H) 12/14/2016 1139   LYMPHSABS 1.6 08/06/2019 2058   LYMPHSABS 1.9 12/14/2016 1139   MONOABS 1.6 (H) 08/06/2019 2058   MONOABS 0.7 12/14/2016 1139   EOSABS 0.2 08/06/2019 2058   EOSABS 0.0 10/17/2017 1430   BASOSABS 0.1 08/06/2019 2058   BASOSABS 0.1 12/14/2016 1139    BMET    Component Value Date/Time   NA 135 08/15/2019 0315   NA 137 12/14/2016 1139   K 4.6 08/15/2019 0315   K 4.2 12/14/2016 1139   CL 101 08/15/2019 0315   CO2 20 (L) 08/15/2019 0315   CO2 20 (L) 12/14/2016 1139   GLUCOSE 181 (H) 08/15/2019 0315   GLUCOSE 115 12/14/2016 1139   BUN 25 (H) 08/15/2019 0315   BUN 32.8 (H) 12/14/2016 1139   CREATININE 3.80 (H) 08/15/2019 0315   CREATININE 2.1 (H) 12/14/2016 1139   CALCIUM 7.7 (L) 08/15/2019 0315   CALCIUM 8.3 (L) 11/11/2017 0740   CALCIUM 9.5 12/14/2016 1139   GFRNONAA 12 (L) 08/15/2019 0315   GFRAA 14 (L) 08/15/2019 0315    INR    Component Value Date/Time   INR 1.6 (H) 08/07/2019 0529     Intake/Output Summary (Last 24 hours) at 08/15/2019 0739 Last data filed at 08/15/2019 0400 Gross per 24  hour  Intake 3416.39 ml  Output 275 ml  Net 3141.39 ml     Assessment/Plan:  63 y.o. female is s/p L femoral to AK pop bypass with vein with subsequent hematoma evacuation L groin 1 Day Post-Op   L PTA and soft ATA by doppler Continue heparin; hold eliquis and plavix Wound vac change tomorrow or Friday with WOC nurse Leukocytosis: continue broad spectrum abx   Dagoberto Ligas, PA-C [facility] [phone] 08/15/2019 7:39 AM

## 2019-08-15 NOTE — Progress Notes (Signed)
CSW visit with the patient at bedside. Patient was lethargic and unable to respond to assessment questions. CSW will return and complete assessment when patient is more alert.  CSW called patient's son, [name] and was unable to leave voice message.  Thurmond Butts, MSW, [place] Social Worker [phone]

## 2019-08-15 NOTE — Progress Notes (Signed)
ANTICOAGULATION CONSULT NOTE - Follow Up Consult  Pharmacy Consult for Heparin  Indication: h/o DVT   Allergies  Allergen Reactions  . Crestor [Rosuvastatin Calcium] Other (See Comments)    Leg pain    Patient Measurements: Height: 5\' 9"  (175.3 cm) Weight: 185 lb 3 oz (84 kg) IBW/kg (Calculated) : 66.2 Heparin Dosing Weight: 84 kg  Vital Signs: Temp: 99.2 F (37.3 C) (11/18 1200) Temp Source: Oral (11/18 1200) BP: 102/48 (11/18 1300) Pulse Rate: 106 (11/18 1300)  Labs: Recent Labs    08/13/19 0237 08/14/19 0249  08/14/19 2019 08/15/19 0315 08/15/19 1743  HGB 9.5* 9.4*   < > 9.9* 9.7* 7.6*  HCT 29.8* 29.7*   < > 29.0* 29.5* 23.8*  PLT 549* 513*  --   --  435*  --   HEPARINUNFRC 0.52 0.56  --   --   --  1.40*  CREATININE 5.05* 2.68*  --   --  3.80*  --    < > = values in this interval not displayed.    Estimated Creatinine Clearance: 17.3 mL/min (A) (by C-G formula based on SCr of 3.8 mg/dL (H)).   Assessment: 63 yr old female PTA for hx DVT on hold for procedure (LD 11/8 PM). Patient now s/p vein graft bypass and L great toe amputation yesterday. Hematoma post op and patient returned to OR for evacuation late last night. Discussed with surgery this AM, ok to resume heparin, will decrease rate slightly to aim for low end of goal no boluses. Hgb 9.7, received 1 unit overnight.   Heparin level drawn ~8 hrs after restarting heparin infusion at 2100 units/hr was 1.4 units/ml, which is above the goal range for this pt. Per RN, no issues with IV or bleeding observed. H/H drawn this afternoon at the same time as the heparin level were 7.6/23.8.  Goal of Therapy:  Heparin level 0.3-0.7 units/ml Monitor platelets by anticoagulation protocol: Yes   Plan:  Hold heparin infusion for 1 hr; then, restart heparin infusion at 1900 units/hr Check 6-hr heparin level after restarting heparin infusion Monitor daily heparin level, CBC Monitor for signs/symptoms of bleeding  Gillermina Hu, PharmD, BCPS, [facility] Clinical Pharmacist Clinical Pharmacist 08/15/2019 7:13 PM

## 2019-08-15 NOTE — Progress Notes (Signed)
Subjective:  Went to OR for revasc and toe amp then had to go back for hematoma- now on dopamine this AM.  Pretty groggy    Objective Vital signs in last 24 hours: Vitals:   08/14/19 2145 08/14/19 2235 08/15/19 0032 08/15/19 0240  BP: (!) 128/40 (!) 130/51 (!) 109/58 (!) 120/51  Pulse: 87 (!) 102 (!) 102 96  Resp: 12 14 18    Temp:  97.7 F (36.5 C) 97.7 F (36.5 C) (!) 97.4 F (36.3 C)  TempSrc:  Oral Oral Oral  SpO2: 97% 96% 97% 98%  Weight:    84 kg  Height:       Weight change: -3.2 kg  Intake/Output Summary (Last 24 hours) at 08/15/2019 2956 Last data filed at 08/15/2019 0400 Gross per 24 hour  Intake 3416.39 ml  Output 275 ml  Net 3141.39 ml    Outpt HD:MWF [facility]  4h 350/800 80kg 2/2.25 bath TDC Heparin 2400 -LUE AVF (poorly matured, not usingUNTIL LOWER EXTREM Infect/ PAD issue stable ) - calc 0.25 tiw   Assessment/ Plan: Kristen Berry is a 63 y.o. yo female with ESRD who was admitted on 08/06/2019 with foot gangrene  Assessment/Plan: 1. Foot gangrene-  Had revasc procedure and toe amp on 11/17- had to go back to OR for hematoma- now on a little dopamine- also vanc and zosyn  2. ESRD - normally MWF via TDC.  Since labs are OK and she is a little unstable I think I would like to postpone her HD to tomorrow.  She is agreeable  3. Anemia- amazing not worse than it is given events- low dose ESA- cont.  No iron due to infection  4. Secondary hyperparathyroidism- resume calcitriol - wait on phoslo  5. HTN/volume-  On dopamine- I assume for BP support, only on coreg-  Volume status not terrible   Kristen Berry    Labs: Basic Metabolic Panel: Recent Labs  Lab 08/13/19 0237 08/14/19 0249 08/14/19 1906 08/14/19 2019 08/15/19 0315  NA 131* 135 134* 135 135  K 3.8 3.8 4.6 4.6 4.6  CL 94* 96*  --   --  101  CO2 21* 24  --   --  20*  GLUCOSE 152* 148*  --   --  181*  BUN 43* 12  --   --  25*  CREATININE 5.05* 2.68*  --   --  3.80*  CALCIUM 8.5* 8.4*   --   --  7.7*  PHOS 3.0 2.6  --   --  5.1*   Liver Function Tests: Recent Labs  Lab 08/13/19 0237 08/14/19 0249 08/15/19 0315  ALBUMIN 2.1* 2.1* 2.5*   No results for input(s): LIPASE, AMYLASE in the last 168 hours. No results for input(s): AMMONIA in the last 168 hours. CBC: Recent Labs  Lab 08/11/19 0258 08/12/19 0714 08/13/19 0237 08/14/19 0249 08/14/19 1906 08/14/19 2019 08/15/19 0315  WBC 25.1* 26.2* 25.9* 24.5*  --   --  31.7*  HGB 9.9* 9.5* 9.5* 9.4* 11.2* 9.9* 9.7*  HCT 31.2* 29.7* 29.8* 29.7* 33.0* 29.0* 29.5*  MCV 93.7 94.9 95.5 96.1  --   --  94.6  PLT 616* 525* 549* 513*  --   --  435*   Cardiac Enzymes: No results for input(s): CKTOTAL, CKMB, CKMBINDEX, TROPONINI in the last 168 hours. CBG: Recent Labs  Lab 08/14/19 0425 08/14/19 0843 08/14/19 1126 08/14/19 1757 08/14/19 2053  GLUCAP 119* 133* 137* 147* 160*    Iron Studies: No results for  input(s): IRON, TIBC, TRANSFERRIN, FERRITIN in the last 72 hours. Studies/Results: No results found. Medications: Infusions: . sodium chloride 10 mL/hr at 08/14/19 1115  . DOPamine 7 mcg/kg/min (08/15/19 0400)  . heparin    . piperacillin-tazobactam (ZOSYN)  IV 2.25 g (08/15/19 0553)  . vancomycin 750 mg (08/13/19 1539)    Scheduled Medications: . atorvastatin  20 mg Oral QHS  . carvedilol  12.5 mg Oral BID  . Chlorhexidine Gluconate Cloth  6 each Topical Q0600  . Chlorhexidine Gluconate Cloth  6 each Topical Q0600  . darbepoetin (ARANESP) injection - DIALYSIS  40 mcg Intravenous Q Fri-HD  . mupirocin ointment   Nasal BID  . pantoprazole  40 mg Oral Daily  . saccharomyces boulardii  250 mg Oral QHS    have reviewed scheduled and prn medications.  Physical Exam: General: groggy but arousable  Heart: RRR Lungs: mostly clear Abdomen: non tender  Extremities: min edema Dialysis Access: right sided TDC-  Also with left AVF-  Weak thrill and bruit     08/15/2019,9:05 AM  LOS: 9 days

## 2019-08-15 NOTE — Progress Notes (Signed)
PT Cancellation Note  Patient Details Name: Kristen Berry MRN: [id number] DOB: 1956/02/04   Cancelled Treatment:    Reason Eval/Treat Not Completed: Patient declined, no reason specified;Fatigue/lethargy limiting ability to participate. Pt lethargic but arousing to PT stimulation intermittently. PT attempts to initiate re-evaluation and mobilization of pt however patient declining multiple times, unable to provide reason. PT will; attempt to follow up as time allows.   Zenaida Niece 08/15/2019, 4:00 PM

## 2019-08-15 NOTE — Care Management Important Message (Signed)
Important Message  Patient Details  Name: SANJNA HASKEW MRN: [id number] Date of Birth: 1956/07/10   Medicare Important Message Given:  Yes     Shelda Altes 08/15/2019, 1:01 PM

## 2019-08-15 NOTE — Progress Notes (Signed)
ANTICOAGULATION CONSULT NOTE - Follow Up Consult  Pharmacy Consult for Heparin + Vanco/Zosyn Indication: h/o DVT + dry gangrene/osteo of toe  Allergies  Allergen Reactions  . Crestor [Rosuvastatin Calcium] Other (See Comments)    Leg pain    Patient Measurements: Height: 5\' 9"  (175.3 cm) Weight: 185 lb 3 oz (84 kg) IBW/kg (Calculated) : 66.2 Heparin Dosing Weight:   Vital Signs: Temp: 97.4 F (36.3 C) (11/18 0240) Temp Source: Oral (11/18 0240) BP: 120/51 (11/18 0240) Pulse Rate: 96 (11/18 0240)  Labs: Recent Labs    08/12/19 1356  08/13/19 0237 08/14/19 0249 08/14/19 1906 08/14/19 2019 08/15/19 0315  HGB  --    < > 9.5* 9.4* 11.2* 9.9* 9.7*  HCT  --    < > 29.8* 29.7* 33.0* 29.0* 29.5*  PLT  --   --  549* 513*  --   --  435*  HEPARINUNFRC 0.62  --  0.52 0.56  --   --   --   CREATININE  --   --  5.05* 2.68*  --   --  3.80*   < > = values in this interval not displayed.    Estimated Creatinine Clearance: 17.3 mL/min (A) (by C-G formula based on SCr of 3.8 mg/dL (H)).   Assessment:  Anticoag: apix PTA for hx DVT on hold for procedure (LD 11/8 PM)  Patient now s/p vein graft bypass and L great toe amputation yesterday. Hematoma post op and patient returned to OR for evacuation late last night. D/w surgery this am, ok to resume heparin, will decrease rate slightly to aim for low end of goal no boluses. Hgb 9.7, received 1 unit overnight.   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 Dialysis planned for today.   11/10 vanc>> 11/9 zosyn>>  1/9 BCx - negF 11/9 covid - neg 11/10 MRSA PCR: pos 11/9: COVID negative 11/16 MRSA PCR pos  Goal of Therapy:  Heparin level 0.3-0.7 units/ml Monitor platelets by anticoagulation protocol: Yes  Vanco peak >20 post-HD   Plan:  Restart heparin at 2100 units/hr Daily HL, CBC, monitor bleeding  Zosyn to 2.25g IV q8h Vancomycin 750mg  IV qHD MWF  Erin Hearing PharmD.,  BCPS Clinical Pharmacist 08/15/2019 8:39 AM

## 2019-08-15 NOTE — Progress Notes (Signed)
Hospitalist progress note  If 7PM-7AM,  night-coverage-look on AMION -prefer pages-not epic appolonia, ackert [phone] DOB: 1955-11-26 DOA: 08/06/2019  PCP: Martinique, Sarah T, MD   Narrative:  63 year old Known peripheral vascular disease with multiple episodes of cellulitis and amputations-critical left leg ischemia status post angioplasty left SFA plus stent ESRD since 11/16/2017  TTS [place] ? MGUS Systolic + diastolic heart failure EF 35% Previous bilateral lower extremity DVTs 2019 HTN Prior C. difficile colitis/2019  Admitted with worsening left great toe gangrene Vascular, Ortho, renal consulted  Assessment & Plan: Sepsis secondary to cellulitis osteomyelitis and left great toe gangrene Status post left femoral to AK pop bypass with hematoma evacuation + debridement skin left foot Placed on dopamine try to titrate downwardsCurrently on 7 mics-secondary to need for dopamine will involve critical care if remains on this over the next day-I have discontinued Coreg 12.5 at this time Repeat hemoglobin in a.m. Continue Vanco and Zosyn as per vascular surgery Acute blood loss anemia from hematoma evacuation-blood loss 600 cc? Given 1 unit of PRBC--recheck labs a.m. ESRD TTS Anemia of renal disease Secondary hyperparathyroidism phosphorus 5.1 Defer to renal-no dialysis today Repeat labs in a.m. Continue PhosLo 3 times daily, Rocaltrol 0.25 MWF Previous bilateral lower extremity DVTs Continues on IV heparin at this time-defer when to switch back to oral anticoagulation-previously was on Brilinta 90 twice daily in addition to apixaban 2.5 twice daily Systolic, diastolic heart failure EF 35% moderate mitral regurg PAP 55 HTN Holding Coreg 12.5 currently, amlodipine 5 until pressures improved also holding hydralazine 25 3 times daily As needed metoprolol for heart rate above 130 Reflux continue pantoprazole 40 daily  Called son chirs Soules [phone]--no asnwer-cannot leave  message Heparin Remain inpatient   Subjective: Quite sleepy no distress able to orient but falls promptly back to sleep Consultants:   Renal  Vascular  Ortho  Procedures:   Left femoral to AK popliteal bypass and then subsequent evacuation of hematoma 11/17 Antimicrobials:   Vancomycin and Zosyn Objective: Vitals:   08/14/19 2145 08/14/19 2235 08/15/19 0032 08/15/19 0240  BP: (!) 128/40 (!) 130/51 (!) 109/58 (!) 120/51  Pulse: 87 (!) 102 (!) 102 96  Resp: 12 14 18    Temp:  97.7 F (36.5 C) 97.7 F (36.5 C) (!) 97.4 F (36.3 C)  TempSrc:  Oral Oral Oral  SpO2: 97% 96% 97% 98%  Weight:    84 kg  Height:        Intake/Output Summary (Last 24 hours) at 08/15/2019 1030 Last data filed at 08/15/2019 0400 Gross per 24 hour  Intake 3283.09 ml  Output 275 ml  Net 3008.09 ml   Filed Weights   08/13/19 1640 08/14/19 0418 08/15/19 0240  Weight: 85 kg 80 kg 84 kg    Examination: Awakens however somewhat sleepy No distress although states somewhat nauseous Able to lift leg Not really hungry breakfast remains untouched Neurologically intact   Data Reviewed: I have personally reviewed following labs and imaging studies  Scheduled Meds: . atorvastatin  20 mg Oral QHS  . calcitRIOL  0.25 mcg Oral Q M,W,F-HD  . Chlorhexidine Gluconate Cloth  6 each Topical Q0600  . Chlorhexidine Gluconate Cloth  6 each Topical Q0600  . darbepoetin (ARANESP) injection - DIALYSIS  40 mcg Intravenous Q Fri-HD  . mupirocin ointment   Nasal BID  . pantoprazole  40 mg Oral Daily  . saccharomyces boulardii  250 mg Oral QHS   Continuous Infusions: . sodium chloride 10 mL/hr  at 08/14/19 1115  . DOPamine 5 mcg/kg/min (08/15/19 1053)  . heparin 2,100 Units/hr (08/15/19 0925)  . piperacillin-tazobactam (ZOSYN)  IV 2.25 g (08/15/19 0553)  . vancomycin 750 mg (08/13/19 1539)    Radiology Studies: Reviewed images personally in health database    LOS: 9 days   Time spent: Roy, MD [facility]  08/15/2019, 10:30 AM

## 2019-08-15 NOTE — Progress Notes (Signed)
ABI's have been completed. Preliminary results can be found in CV Proc through chart review.   08/15/19 3:33 PM Kristen Berry RVT

## 2019-08-15 NOTE — Anesthesia Postprocedure Evaluation (Signed)
Anesthesia Post Note  Patient: Kristen Berry  Procedure(s) Performed: LEFT FEMORAL-POPLITEAL ARTERY Bypass Graft (Left Leg Upper) AMPUTATION LEFT GREAT TOE (Left Foot) Application Of Wound Vac to left foot (Left )     Patient location during evaluation: PACU Anesthesia Type: General Level of consciousness: confused Pain management: pain level controlled Vital Signs Assessment: post-procedure vital signs reviewed and stable Respiratory status: spontaneous breathing, nonlabored ventilation and respiratory function stable Cardiovascular status: unstable Postop Assessment: no apparent nausea or vomiting Anesthetic complications: no Comments: Patient arrived to PACU stable. Within a half hour of arriving, patient became progressively hypotensive and confused. Surgeon decided to bring patient back to OR for possible bleed.    Last Vitals:  Vitals:   08/15/19 0032 08/15/19 0240  BP: (!) 109/58 (!) 120/51  Pulse: (!) 102 96  Resp: 18   Temp: 36.5 C (!) 36.3 C  SpO2: 97% 98%    Last Pain:  Vitals:   08/15/19 0432  TempSrc:   PainSc: [place]

## 2019-08-15 NOTE — Progress Notes (Signed)
NP called by attending re: Hgb result. Pt is 7.6 now, but has had a 4 gm drop since admission and had to be on Dopamine due to HTN. Attending wanted 1 unit blood given. Orders placed. Last BP 104/50. HR 109. Will check H/H after transfusion. KJKG, NP [facility]

## 2019-08-15 NOTE — Progress Notes (Signed)
Visited with patient. Bypass and amputation performed yesterday. Wound Vac in place. Discussed with Dr. Sharol Given.

## 2019-08-16 LAB — BASIC METABOLIC PANEL
Anion gap: 18 — ABNORMAL HIGH (ref 5–15)
BUN: 31 mg/dL — ABNORMAL HIGH (ref 8–23)
CO2: 18 mmol/L — ABNORMAL LOW (ref 22–32)
Calcium: 7.5 mg/dL — ABNORMAL LOW (ref 8.9–10.3)
Chloride: 97 mmol/L — ABNORMAL LOW (ref 98–111)
Creatinine, Ser: 4.75 mg/dL — ABNORMAL HIGH (ref 0.44–1.00)
GFR calc Af Amer: 10 mL/min — ABNORMAL LOW (ref >60)
GFR calc non Af Amer: 9 mL/min — ABNORMAL LOW (ref >60)
Glucose, Bld: 123 mg/dL — ABNORMAL HIGH (ref 70–99)
Potassium: 4.2 mmol/L (ref 3.5–5.1)
Sodium: 133 mmol/L — ABNORMAL LOW (ref 135–145)

## 2019-08-16 LAB — CBC
HCT: 24.8 % — ABNORMAL LOW (ref 36.0–46.0)
Hemoglobin: 7.9 g/dL — ABNORMAL LOW (ref 12.0–15.0)
MCH: 30.4 pg (ref 26.0–34.0)
MCHC: 31.9 g/dL (ref 30.0–36.0)
MCV: 95.4 fL (ref 80.0–100.0)
Platelets: 374 10*3/uL (ref 150–400)
RBC: 2.6 MIL/uL — ABNORMAL LOW (ref 3.87–5.11)
RDW: 17.3 % — ABNORMAL HIGH (ref 11.5–15.5)
WBC: 21.7 10*3/uL — ABNORMAL HIGH (ref 4.0–10.5)
nRBC: 0 % (ref 0.0–0.2)

## 2019-08-16 LAB — HEPARIN LEVEL (UNFRACTIONATED)
Heparin Unfractionated: 0.17 IU/mL — ABNORMAL LOW (ref 0.30–0.70)
Heparin Unfractionated: 0.24 IU/mL — ABNORMAL LOW (ref 0.30–0.70)
Heparin Unfractionated: <0.1 IU/mL — ABNORMAL LOW (ref 0.30–0.70)

## 2019-08-16 LAB — GLUCOSE, CAPILLARY
Glucose-Capillary: 114 mg/dL — ABNORMAL HIGH (ref 70–99)
Glucose-Capillary: 121 mg/dL — ABNORMAL HIGH (ref 70–99)
Glucose-Capillary: 137 mg/dL — ABNORMAL HIGH (ref 70–99)
Glucose-Capillary: 146 mg/dL — ABNORMAL HIGH (ref 70–99)

## 2019-08-16 LAB — PREPARE RBC (CROSSMATCH)

## 2019-08-16 MED ORDER — DARBEPOETIN ALFA 100 MCG/0.5ML IJ SOSY: 100.0000 ug | PREFILLED_SYRINGE | INTRAMUSCULAR | Status: DC

## 2019-08-16 MED ORDER — PHENYLEPHRINE HCL-NACL 10-0.9 MG/250ML-% IV SOLN
INTRAVENOUS | Status: AC
  Filled 2019-08-16: qty 250

## 2019-08-16 MED ORDER — HEPARIN SODIUM (PORCINE) 1000 UNIT/ML IJ SOLN
INTRAMUSCULAR | Status: AC
  Administered 2019-08-16: 3400 [IU] via INTRAVENOUS_CENTRAL
  Filled 2019-08-16: qty 4

## 2019-08-16 MED ORDER — VANCOMYCIN VARIABLE DOSE PER UNSTABLE RENAL FUNCTION (PHARMACIST DOSING): Status: DC

## 2019-08-16 MED ORDER — ACETAMINOPHEN 325 MG PO TABS
650.0000 mg | ORAL_TABLET | Freq: Once | ORAL | Status: AC
  Administered 2019-08-16: 650 mg via ORAL
  Filled 2019-08-16: qty 2

## 2019-08-16 MED ORDER — VANCOMYCIN HCL IN DEXTROSE 750-5 MG/150ML-% IV SOLN
INTRAVENOUS | Status: AC
  Filled 2019-08-16: qty 150

## 2019-08-16 MED ORDER — PROPOFOL 1000 MG/100ML IV EMUL
INTRAVENOUS | Status: AC
  Filled 2019-08-16: qty 200

## 2019-08-16 MED ORDER — SODIUM CHLORIDE 0.9% IV SOLUTION: Freq: Once | INTRAVENOUS | Status: DC

## 2019-08-16 NOTE — Progress Notes (Signed)
CONSULT NOTE - Follow Up Consult  Pharmacy Consult forVanco/Zosyn Indication:  dry gangrene/osteo of toe  Allergies  Allergen Reactions  . Crestor [Rosuvastatin Calcium] Other (See Comments)    Leg pain    Patient Measurements: Height: 5\' 9"  (175.3 cm) Weight: 188 lb 11.4 oz (85.6 kg) IBW/kg (Calculated) : 66.2 Heparin Dosing Weight:   Vital Signs: Temp: 98.1 F (36.7 C) (11/19 0800) Temp Source: Oral (11/19 0800) BP: 109/53 (11/19 1130) Pulse Rate: 80 (11/19 1130)  Labs: Recent Labs    08/14/19 0249  08/15/19 0315 08/15/19 1743 08/16/19 0547  HGB 9.4*   < > 9.7* 7.6* 7.9*  HCT 29.7*   < > 29.5* 23.8* 24.8*  PLT 513*  --  435*  --  374  HEPARINUNFRC 0.56  --   --  1.40* 0.17*  CREATININE 2.68*  --  3.80*  --  4.75*   < > = values in this interval not displayed.    Estimated Creatinine Clearance: 14 mL/min (A) (by C-G formula based on SCr of 4.75 mg/dL (H)).   Assessment: 63 yo female on vancomycin for cellulitis/Osteo L great toe with dry gangrene. She is noted with HD normally MWF. -Last HD was 11/19 (missed 11/18) -vancomycin given 11/18 -WBC= 21.7, afebrile   11/10 vanc>> 11/9 zosyn>>  1/9 BCx - negF 11/9 covid - neg 11/10 MRSA PCR: pos 11/9: COVID negative 11/16 MRSA PCR pos  Goal of Therapy:  Vanco peak >20 post-HD   Plan:  -Will check a pre-HD level prior to HD on 11/21 -Hold vancomycin for now -No zosyn changes -Will follow HD schedule and clinical progress  Hildred Laser, PharmD Clinical Pharmacist **Pharmacist phone directory can now be found on [email or web address] ([email or web address]).  Listed under [place].

## 2019-08-16 NOTE — Progress Notes (Signed)
ANTICOAGULATION CONSULT NOTE - Follow Up Consult  Pharmacy Consult for Heparin  Indication: h/o DVT   Allergies  Allergen Reactions  . Crestor [Rosuvastatin Calcium] Other (See Comments)    Leg pain    Patient Measurements: Height: 5\' 9"  (175.3 cm) Weight: 187 lb 2.7 oz (84.9 kg) IBW/kg (Calculated) : 66.2 Heparin Dosing Weight: 84 kg  Vital Signs: Temp: 97.8 F (36.6 C) (11/19 0410) Temp Source: Oral (11/19 0410) BP: 129/45 (11/19 0410) Pulse Rate: 106 (11/19 0410)  Labs: Recent Labs    08/14/19 0249  08/15/19 0315 08/15/19 1743 08/16/19 0547  HGB 9.4*   < > 9.7* 7.6* 7.9*  HCT 29.7*   < > 29.5* 23.8* 24.8*  PLT 513*  --  435*  --  374  HEPARINUNFRC 0.56  --   --  1.40* 0.17*  CREATININE 2.68*  --  3.80*  --  4.75*   < > = values in this interval not displayed.    Estimated Creatinine Clearance: 13.9 mL/min (A) (by C-G formula based on SCr of 4.75 mg/dL (H)).   Assessment: 64 yr old female PTA for hx DVT on hold for procedure (LD 11/8 PM). Patient now s/p vein graft bypass and L great toe amputation yesterday. Hematoma post op and patient returned to OR for evacuation late last night. Discussed with surgery this AM, ok to resume heparin, will decrease rate slightly to aim for low end of goal no boluses. Hgb 9.7, received 1 unit overnight.   Heparin level drawn ~8 hrs after restarting heparin infusion at 2100 units/hr was 1.4 units/ml, which is above the goal range for this pt. Per RN, no issues with IV or bleeding observed. H/H drawn this afternoon at the same time as the heparin level were 7.6/23.8.  11/19 AM update: Heparin level low, however, heparin possibly off for significant amount of time prior to/during lab drawn  Goal of Therapy:  Heparin level 0.3-0.7 units/ml Monitor platelets by anticoagulation protocol: Yes   Plan:  Cont heparin at 1900 units/hr given possible off time prior to lab drawn Re-check heparin level at [place], PharmD,  [place] Pharmacist Phone: [phone]

## 2019-08-16 NOTE — Procedures (Signed)
Patient was seen on dialysis and the procedure was supervised.  BFR 400  Via TDC BP is  112/52.   Patient appears to be tolerating treatment well  Louis Meckel 08/16/2019

## 2019-08-16 NOTE — Progress Notes (Signed)
Subjective  - POD #2  More alert today without significant complaints of pain   Physical Exam:  Patient seen and examined while on dialysis. Her wound VAC remains intact with a good seal.  She has brisk Doppler signals       Assessment/Plan:  POD #2  Wound VAC will be changed once she has completed dialysis.  I discussed with her because of the significant deep space infection in her foot, a significant amount of tissue had to be removed.  She remains at very high risk for more proximal amputation.  Wells Brabham 08/16/2019 10:52 PM --  Vitals:   08/16/19 1844 08/16/19 1945  BP:  119/60  Pulse: (!) 118 (!) 113  Resp:  15  Temp:  97.8 F (36.6 C)  SpO2: 97% 98%    Intake/Output Summary (Last 24 hours) at 08/16/2019 2252 Last data filed at 08/16/2019 1945 Gross per 24 hour  Intake 1106.23 ml  Output 2000 ml  Net -893.77 ml     Laboratory CBC    Component Value Date/Time   WBC 21.7 (H) 08/16/2019 0547   HGB 7.9 (L) 08/16/2019 0547   HGB 14.3 12/14/2016 1139   HCT 24.8 (L) 08/16/2019 0547   HCT 44.2 12/14/2016 1139   PLT 374 08/16/2019 0547   PLT 429 (H) 12/14/2016 1139    BMET    Component Value Date/Time   NA 133 (L) 08/16/2019 0547   NA 137 12/14/2016 1139   K 4.2 08/16/2019 0547   K 4.2 12/14/2016 1139   CL 97 (L) 08/16/2019 0547   CO2 18 (L) 08/16/2019 0547   CO2 20 (L) 12/14/2016 1139   GLUCOSE 123 (H) 08/16/2019 0547   GLUCOSE 115 12/14/2016 1139   BUN 31 (H) 08/16/2019 0547   BUN 32.8 (H) 12/14/2016 1139   CREATININE 4.75 (H) 08/16/2019 0547   CREATININE 2.1 (H) 12/14/2016 1139   CALCIUM 7.5 (L) 08/16/2019 0547   CALCIUM 8.3 (L) 11/11/2017 0740   CALCIUM 9.5 12/14/2016 1139   GFRNONAA 9 (L) 08/16/2019 0547   GFRAA 10 (L) 08/16/2019 0547    COAG Lab Results  Component Value Date   INR 1.6 (H) 08/07/2019   INR 1.2 06/25/2019   INR 1.1 02/15/2019   No results found for: PTT  Antibiotics Anti-infectives (From admission,  onward)   Start     Dose/Rate Route Frequency Ordered Stop   08/16/19 1328  vancomycin variable dose per unstable renal function (pharmacist dosing)      Does not apply See admin instructions 08/16/19 1329     08/16/19 1119  Vancomycin (VANCOCIN) 750-5 MG/150ML-% IVPB  Status:  Discontinued    Note to Pharmacy: Kennon Rounds   : cabinet override      08/16/19 1119 08/16/19 1126   08/14/19 1330  piperacillin-tazobactam (ZOSYN) IVPB 2.25 g  Status:  Discontinued     2.25 g 100 mL/hr over 30 Minutes Intravenous To Surgery 08/14/19 1317 08/14/19 2216   08/10/19 0922  Vancomycin (VANCOCIN) 750-5 MG/150ML-% IVPB    Note to Pharmacy: Murriel Hopper   : cabinet override      08/10/19 0922 08/10/19 2129   08/08/19 1914  Vancomycin (VANCOCIN) 750-5 MG/150ML-% IVPB    Note to Pharmacy: Cherylann Banas   : cabinet override      08/08/19 1914 08/08/19 2111   08/08/19 1200  vancomycin (VANCOCIN) IVPB 750 mg/150 ml premix  Status:  Discontinued     750 mg 150 mL/hr over 60  Minutes Intravenous Every M-W-F (Hemodialysis) 08/06/19 2229 08/16/19 1329   08/07/19 1930  vancomycin (VANCOCIN) IVPB 750 mg/150 ml premix     750 mg 150 mL/hr over 60 Minutes Intravenous  Once 08/07/19 1844 08/07/19 2135   08/07/19 0830  piperacillin-tazobactam (ZOSYN) IVPB 2.25 g     2.25 g 100 mL/hr over 30 Minutes Intravenous Every 8 hours 08/07/19 0812     08/07/19 0800  piperacillin-tazobactam (ZOSYN) IVPB 3.375 g  Status:  Discontinued     3.375 g 12.5 mL/hr over 240 Minutes Intravenous Every 12 hours 08/07/19 0303 08/07/19 0812   08/06/19 2230  piperacillin-tazobactam (ZOSYN) IVPB 3.375 g     3.375 g 100 mL/hr over 30 Minutes Intravenous  Once 08/06/19 2222 08/06/19 2342   08/06/19 2230  vancomycin (VANCOCIN) 1,750 mg in sodium chloride 0.9 % 500 mL IVPB     1,750 mg 250 mL/hr over 120 Minutes Intravenous  Once 08/06/19 2226 08/07/19 0429       V. Leia Alf, M.D., [facility]  [place] Office: [phone] Pager:  [phone]

## 2019-08-16 NOTE — TOC Initial Note (Signed)
Transition of Care [facility]) - Initial/Assessment Note    Patient Details  Name: Kristen Berry MRN: [id number] Date of Birth: 02-Sep-1956  [facility] of [facility]) CM/SW Contact:    Vinie Sill, [place] Phone Number: 08/16/2019, 2:50 PM  Clinical Narrative:                  CSW visit with the patient at bedside. CSW introduced self and explained role. Patient states she lives alone. Patient appeared and expressed being sad because her son, [name] or her daughter , Warren Lacy has come to visit with her. She states she has another son, Marcello Moores but he lives in [place]. CSW requested she call her son,Chris because CSW has not been able to reach him.  CSW discussed PT recommendation of ST rehab at [facility]. Patient recognize the need for rehab and was agreeable to SNF. Patient states she has been to [facility] before was agreeable to returning if bed is offered. Patient states dialysis is MWF  @ 11:20 or 11:30 am in [place]. Patient states preference for  SNF in the [place] or [place] area. CSW faxed out referrals.  CSW spoke with patient's son, [name], [phone]- provided updated and informed him of PT recommendation. He expressed he has a broken shoulder and some fractured ribs and was not able to provide the level of care needed at this time for his mother. He regrets he can not be there. He states her daughter Warren Lacy has "ran off with her boyfriend".  He agreed with the discharge plan.   CSW will continue to follow and assist with discharge planning.   Thurmond Butts, MSW, [facility] Clinical Social Worker [phone]     Expected Discharge Plan: Skilled Nursing Facility Barriers to Discharge: Continued Medical Work up   Patient Goals and CMS Choice        Expected Discharge Plan and Services Expected Discharge Plan: [place] arrangements for the past 2 months: Mobile Home                                      Prior Living  Arrangements/Services Living arrangements for the past 2 months: Mobile Home Lives with:: Self Patient language and need for interpreter reviewed:: No Do you feel safe going back to the place where you live?: No   patient is agreeable to ST rehab at SNF  Need for Family Participation in Patient Care: Yes (Comment) Care giver support system in place?: No (comment)   Criminal Activity/Legal Involvement Pertinent to Current Situation/Hospitalization: No - Comment as needed  Activities of Daily Living Home Assistive Devices/Equipment: Bedside commode/3-in-1, Shower chair with back, Raised toilet seat with rails, Other (Comment), Walker (specify type), Wheelchair(prothesis) ADL Screening (condition at time of admission) Patient's cognitive ability adequate to safely complete daily activities?: Yes Is the patient deaf or have difficulty hearing?: Yes Does the patient have difficulty seeing, even when wearing glasses/contacts?: Yes Does the patient have difficulty concentrating, remembering, or making decisions?: No Patient able to express need for assistance with ADLs?: Yes Does the patient have difficulty dressing or bathing?: Yes Independently performs ADLs?: No Communication: Independent Dressing (OT): Independent Grooming: Independent Feeding: Independent Bathing: Independent Toileting: Independent In/Out Bed: Independent with device (comment)(wheelchair) Walks in Home: Needs assistance(has prothesis and wheelchair) Is this a change from baseline?: Pre-admission baseline Does the patient have difficulty walking or climbing stairs?: Yes  Weakness of Legs: Both Weakness of Arms/Hands: None  Permission Sought/Granted Permission sought to share information with : Family Supports, Customer service manager, Case Optician, dispensing granted to share information with : Yes, Verbal Permission Granted  Share Information with NAME: Aashritha Miedema  Permission granted to share info w AGENCY:  SNFs  Permission granted to share info w Relationship: son  Permission granted to share info w Contact Information: [phone]  Emotional Assessment Appearance:: Appears stated age Attitude/Demeanor/Rapport: Engaged Affect (typically observed): Accepting, Appropriate, Frustrated Orientation: : Oriented to Self, Oriented to Place, Oriented to  Time, Oriented to Situation Alcohol / Substance Use: Not Applicable Psych Involvement: No (comment)  Admission diagnosis:  Sepsis [facility]) [A41.9] Patient Active Problem List   Diagnosis Date Noted  . Gangrene of left foot ([place])   . Sepsis ([place]) 08/06/2019  . MRSA (methicillin resistant Staphylococcus aureus) 06/26/2019  . Cellulitis of left foot 06/25/2019  . Anemia in chronic kidney disease 01/29/2019  . Complete traumatic amputation at level between knee and ankle, unspecified lower leg, initial encounter ([place]) 01/12/2019  . Below knee amputation ([place])   . S/P BKA (below knee amputation), right ([place]) 01/05/2019  . ESRD on hemodialysis ([place])   . Coronary artery disease without angina pectoris   . Cellulitis 01/02/2019  . Gangrene of right foot ([place])   . Right foot infection 12/21/2018  . Unspecified open wound of right great toe without damage to nail, initial encounter 12/14/2018  . Diarrhea, unspecified 05/31/2018  . PAD (peripheral artery disease) ([place]) 01/24/2018  . Peripheral artery disease ([place]) 01/24/2018  . Anticoagulated 01/13/2018  . Rib pain 01/13/2018  . Transient cerebral ischemic attack, unspecified 01/13/2018  . C. difficile diarrhea 01/06/2018  . Hypokalemia 01/06/2018  . ESRD (end stage renal disease) ([place]) 01/06/2018  . Prolonged QT interval 01/06/2018  . Hypotension 01/06/2018  . Multiple rib fractures 01/06/2018  . Encounter for immunization 11/22/2017  . Iron deficiency anemia, unspecified 11/21/2017  . Coagulation defect, unspecified ([place]) 11/17/2017  . 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 Hypoglycemia, unspecified 11/17/2017  . Pain, unspecified 11/17/2017  . Personal history of other venous thrombosis and embolism 11/17/2017  . Presence of other vascular implants and grafts 11/17/2017  . Pruritus, unspecified 11/17/2017  . Secondary hyperparathyroidism of renal origin ([place]) 11/17/2017  . Type 2 diabetes mellitus with diabetic neuropathy, unspecified ([place]) 11/17/2017  . Pressure injury of skin 11/08/2017  . Coronary artery disease due to lipid rich plaque   . Chest pain   . Acute on chronic systolic CHF (congestive heart failure), NYHA class 3 ([place]) 10/18/2017  . PICC (peripherally inserted central catheter) in place   . Acute osteomyelitis of toe of left foot ([place])   . Essential hypertension 12/14/2016  . Abnormal bone xray 06/15/2016  . MGUS (monoclonal gammopathy of unknown significance) 05/18/2016  . Stage 4 chronic kidney disease ([place]) 05/18/2016  . CHF (congestive heart failure) ([place]) 05/18/2016  . Renal insufficiency 06/28/2013  . Other and unspecified hyperlipidemia 06/28/2013  . Obesity, unspecified 06/28/2013  . Pain in limb 06/28/2013  . Diabetes mellitus type 2, insulin dependent ([place]) 06/28/2013   PCP:  Martin[place], Sarah T, MD Pharmacy:   [facility] [address], [place] [address] Phone: [phone] Fax: [phone]  [facility] [address] - [place], [place] - [address] [place], [place] [address] Phone: [phone] Fax: [phone]     Social Determinants of Health (SDOH) Interventions    Readmission Risk Interventions No flowsheet data found.

## 2019-08-16 NOTE — Progress Notes (Signed)
Subjective:  Seen on HD this AM-  Still on 2.5 of dopa but BP seems OK-  Attempting 2 liters of UF - still groggy but better than yest   Objective Vital signs in last 24 hours: Vitals:   08/16/19 0800 08/16/19 0807 08/16/19 0830 08/16/19 0900  BP: (!) 113/51 (!) 111/52 (!) 118/50 (!) 118/48  Pulse: 98 98 96 95  Resp: 12 12 11 10   Temp: 98.1 F (36.7 C)     TempSrc: Oral     SpO2: 99%     Weight: 85.6 kg     Height:       Weight change: 0.9 kg  Intake/Output Summary (Last 24 hours) at 08/16/2019 0814 Last data filed at 08/16/2019 0400 Gross per 24 hour  Intake 1363.69 ml  Output -  Net 1363.69 ml    Outpt HD:MWF [facility]  4h 350/800 80kg 2/2.25 bath TDC Heparin 2400 -LUE AVF (poorly matured, not usingUNTIL LOWER EXTREM Infect/ PAD issue stable ) - calc 0.25 tiw   Assessment/ Plan: Pt is a 63 y.o. yo female with ESRD who was admitted on 08/06/2019 with foot gangrene  Assessment/Plan: 1. Foot gangrene-  Had revasc procedure and toe amp on 11/17- had to go back to OR for hematoma- now on a little dopamine- also vanc and zosyn  2. ESRD - normally MWF via TDC.  Running today off schedule due to instability yesterday-  Likely will run Sat and Monday to get back on schedule 3. Anemia- finally did drop - low dose ESA, will inc dose - cont.  No iron due to infection -  Got one unit last night-  Not much of bump  4. Secondary hyperparathyroidism- resumed calcitriol - wait on phoslo  5. HTN/volume-  On dopamine- I assume for BP support,  May be able to take off today.  Attempting 2 liters UF with HD today   Louis Meckel    Labs: Basic Metabolic Panel: Recent Labs  Lab 08/13/19 0237 08/14/19 0249  08/14/19 2019 08/15/19 0315 08/16/19 0547  NA 131* 135   < > 135 135 133*  K 3.8 3.8   < > 4.6 4.6 4.2  CL 94* 96*  --   --  101 97*  CO2 21* 24  --   --  20* 18*  GLUCOSE 152* 148*  --   --  181* 123*  BUN 43* 12  --   --  25* 31*  CREATININE 5.05* 2.68*   --   --  3.80* 4.75*  CALCIUM 8.5* 8.4*  --   --  7.7* 7.5*  PHOS 3.0 2.6  --   --  5.1*  --    < > = values in this interval not displayed.   Liver Function Tests: Recent Labs  Lab 08/13/19 0237 08/14/19 0249 08/15/19 0315  ALBUMIN 2.1* 2.1* 2.5*   No results for input(s): LIPASE, AMYLASE in the last 168 hours. No results for input(s): AMMONIA in the last 168 hours. CBC: Recent Labs  Lab 08/12/19 0714 08/13/19 0237 08/14/19 0249  08/15/19 0315 08/15/19 1743 08/16/19 0547  WBC 26.2* 25.9* 24.5*  --  31.7*  --  21.7*  HGB 9.5* 9.5* 9.4*   < > 9.7* 7.6* 7.9*  HCT 29.7* 29.8* 29.7*   < > 29.5* 23.8* 24.8*  MCV 94.9 95.5 96.1  --  94.6  --  95.4  PLT 525* 549* 513*  --  435*  --  374   < > =  values in this interval not displayed.   Cardiac Enzymes: No results for input(s): CKTOTAL, CKMB, CKMBINDEX, TROPONINI in the last 168 hours. CBG: Recent Labs  Lab 08/14/19 1757 08/14/19 2053 08/15/19 0947 08/15/19 1342 08/15/19 2119  GLUCAP 147* 160* 130* 131* 146*    Iron Studies: No results for input(s): IRON, TIBC, TRANSFERRIN, FERRITIN in the last 72 hours. Studies/Results: Vas [place] Abi With/wo Tbi  Result Date: 08/15/2019 LOWER EXTREMITY DOPPLER STUDY Indications: Peripheral artery disease.  Vascular Interventions: 08/14/2019 - LEFT FEMORAL-POPLITEAL ARTERY Bypass Graft                         AMPUTATION LEFT GREAT TOE                          Application Of Wound Vac to left foot. Limitations: Today's exam was limited due to patient intolerant to cuff pressure              and Right BKA, Wound vac on left foot. Comparison Study: 06/26/2019 - R BKA L 0.29 Performing Technologist: Carlos Levering Rvt  Examination Guidelines: A complete evaluation includes at minimum, Doppler waveform signals and systolic blood pressure reading at the level of bilateral brachial, anterior tibial, and posterior tibial arteries, when vessel segments are accessible. Bilateral testing is considered an  integral part of a complete examination. Photoelectric Plethysmograph (PPG) waveforms and toe systolic pressure readings are included as required and additional duplex testing as needed. Limited examinations for reoccurring indications may be performed as noted.  ABI Findings: +--------+------------------+-----+--------+--------+ Right   Rt Pressure (mmHg)IndexWaveformComment  +--------+------------------+-----+--------+--------+ Brachial99                     biphasic         +--------+------------------+-----+--------+--------+ PTA                                    BKA      +--------+------------------+-----+--------+--------+ DP                                     BKA      +--------+------------------+-----+--------+--------+ +--------+------------------+-----+----------+--------------+ Left    Lt Pressure (mmHg)IndexWaveform  Comment        +--------+------------------+-----+----------+--------------+ Brachial                                 Restricted arm +--------+------------------+-----+----------+--------------+ PTA     53                0.54 biphasic                 +--------+------------------+-----+----------+--------------+ DP      61                0.62 monophasic               +--------+------------------+-----+----------+--------------+ +-------+-----------+-----------+------------+------------+ ABI/TBIToday's ABIToday's TBIPrevious ABIPrevious TBI +-------+-----------+-----------+------------+------------+ Left   0.62                  0.29                     +-------+-----------+-----------+------------+------------+  Summary: Right: Unable to obtain pressures and waveforms due to BKA. Left: Resting left  ankle-brachial index indicates moderate left lower extremity arterial disease.  *See table(s) above for measurements and observations.     Preliminary    Medications: Infusions: . sodium chloride 10 mL/hr at 08/14/19 1115  . DOPamine  2.5 mcg/kg/min (08/16/19 0400)  . heparin 1,900 Units/hr (08/16/19 0400)  . piperacillin-tazobactam (ZOSYN)  IV 2.25 g (08/16/19 3374)  . vancomycin Stopped (08/15/19 2227)    Scheduled Medications: . atorvastatin  20 mg Oral QHS  . calcitRIOL  0.25 mcg Oral Q M,W,F-HD  . Chlorhexidine Gluconate Cloth  6 each Topical Q0600  . Chlorhexidine Gluconate Cloth  6 each Topical Q0600  . darbepoetin (ARANESP) injection - DIALYSIS  40 mcg Intravenous Q Fri-HD  . mupirocin ointment   Nasal BID  . pantoprazole  40 mg Oral Daily  . saccharomyces boulardii  250 mg Oral QHS    have reviewed scheduled and prn medications.  Physical Exam: General: groggy but arousable  Heart: RRR Lungs: mostly clear Abdomen: non tender  Extremities: some dep  edema Dialysis Access: right sided TDC-  Also with left AVF-  Weak thrill and bruit     08/16/2019,9:18 AM  LOS: 10 days

## 2019-08-16 NOTE — Progress Notes (Signed)
ANTICOAGULATION CONSULT NOTE - Follow Up Consult  Pharmacy Consult for Heparin  Indication: h/o DVT   Allergies  Allergen Reactions  . Crestor [Rosuvastatin Calcium] Other (See Comments)    Leg pain    Patient Measurements: Height: 5\' 9"  (175.3 cm) Weight: 188 lb 11.4 oz (85.6 kg) IBW/kg (Calculated) : 66.2 Heparin Dosing Weight: 84 kg  Vital Signs: Temp: 98.1 F (36.7 C) (11/19 0800) Temp Source: Oral (11/19 0800) BP: 109/53 (11/19 1130) Pulse Rate: 80 (11/19 1130)  Labs: Recent Labs    08/14/19 0249  08/15/19 0315 08/15/19 1743 08/16/19 0547 08/16/19 1351  HGB 9.4*   < > 9.7* 7.6* 7.9*  --   HCT 29.7*   < > 29.5* 23.8* 24.8*  --   PLT 513*  --  435*  --  374  --   HEPARINUNFRC 0.56  --   --  1.40* 0.17* 0.24*  CREATININE 2.68*  --  3.80*  --  4.75*  --    < > = values in this interval not displayed.    Estimated Creatinine Clearance: 14 mL/min (A) (by C-G formula based on SCr of 4.75 mg/dL (H)).   Assessment: 63 yr old female PTA for hx DVT on hold for procedure (LD 11/8 PM). Patient now s/p vein graft bypass and L great toe amputation yesterday. Hematoma post op and patient returned to OR for evacuation 11/17. Heparin restarted on 11/18 -heparin level= 0.24 -hg= 7.9   Goal of Therapy:  Heparin level 0.3-0.7 units/ml Monitor platelets by anticoagulation protocol: Yes   Plan:  -Increase heparin to 2050 units/hr -Heparin level in 8 hours and daily wth CBC daily  Hildred Laser, PharmD Clinical Pharmacist **Pharmacist phone directory can now be found on [email or web address] ([email or web address]).  Listed under [place].

## 2019-08-16 NOTE — Progress Notes (Signed)
Physical Therapy Treatment Patient Details Name: Kristen Berry MRN: [id number] DOB: 1955/10/10 Today's Date: 08/16/2019    History of Present Illness 63 y.o. female with history of ESRD on hemodialysis on Monday Wednesday Friday, peripheral vascular disease status post stenting, history of DVT, anemia presents to the ER after patient has been noticing increasing swelling of the left foot increasing discharge and gangrene of the great toe noticed over the last 2 weeks. Pt underwent revascularization of LLE and L great toe amputation 11/17 and experienced LLE hematoma post-op. Pt returned to OR to evacuate hematoma and repair vein on 11/17.    PT Comments    Pt underwent revascularization of LLE with L great toe amputation, complicated by LLE hematoma post-op which was subsequently evacuated. PT updated plan of care and goals according to pt current functional status. Pt demonstrates deficits in functional mobility, balance, endurance, strength, power, cognition, and safety awareness. Pt very confused during session as documented in subjective. PT session limited by confusion due to safety concerns with WB precautions. Pt requires physical assistance for bed mobility and sitting balance at this time to maintain safety. Pt will benefit from continued acute PT services to improve functional mobility and reduce falls risk.  Follow Up Recommendations  SNF     Equipment Recommendations  (defer to post-acute setting)    Recommendations for Other Services       Precautions / Restrictions Precautions Precautions: Fall;Other (comment) Precaution Comments: R BKA Restrictions Weight Bearing Restrictions: Yes LLE Weight Bearing: (heel WB per Dr. Charleston Ropes)    Mobility  Bed Mobility Overal bed mobility: Needs Assistance Bed Mobility: Supine to Sit     Supine to sit: Mod assist;HOB elevated Sit to supine: Mod assist      Transfers Overall transfer level: (PT declines transfer training 2/2  confusion)                  Ambulation/Gait                 Stairs             Wheelchair Mobility    Modified Rankin (Stroke Patients Only)       Balance Overall balance assessment: Needs assistance Sitting-balance support: Bilateral upper extremity supported;Feet unsupported Sitting balance-Leahy Scale: Fair Sitting balance - Comments: minG                                    Cognition Arousal/Alertness: Awake/alert Behavior During Therapy: WFL for tasks assessed/performed Overall Cognitive Status: Impaired/Different from baseline Area of Impairment: Orientation;Memory;Following commands;Safety/judgement;Awareness;Problem solving                 Orientation Level: Disoriented to;Time   Memory: Decreased recall of precautions;Decreased short-term memory Following Commands: Follows multi-step commands inconsistently;Follows one step commands consistently Safety/Judgement: Decreased awareness of safety;Decreased awareness of deficits   Problem Solving: Slow processing        Exercises      General Comments General comments (skin integrity, edema, etc.): Pt tachy to 125 sitting edge of bed      Pertinent Vitals/Pain Pain Assessment: Faces Faces Pain Scale: Hurts even more Pain Location: LLE Pain Descriptors / Indicators: Grimacing Pain Intervention(s): Limited activity within patient's tolerance    Home Living                      Prior Function  PT Goals (current goals can now be found in the care plan section) Acute Rehab PT Goals Patient Stated Goal: To improve mobility PT Goal Formulation: With patient Time For Goal Achievement: 08/30/19 Potential to Achieve Goals: Fair Additional Goals Additional Goal #1: Pt will mobilize in [facility] with supervision for 50' utilizing BUE to improve independence in the home setting. Progress towards PT goals: Goals downgraded-see care plan    Frequency     Min 2X/week      PT Plan Frequency needs to be updated    Co-evaluation              AM-PAC PT "6 Clicks" Mobility   Outcome Measure  Help needed turning from your back to your side while in a flat bed without using bedrails?: A Lot Help needed moving from lying on your back to sitting on the side of a flat bed without using bedrails?: A Lot Help needed moving to and from a bed to a chair (including a wheelchair)?: Total Help needed standing up from a chair using your arms (e.g., wheelchair or bedside chair)?: Total Help needed to walk in hospital room?: Total Help needed climbing 3-5 steps with a railing? : Total 6 Click Score: 8    End of Session Equipment Utilized During Treatment: (none) Activity Tolerance: Other (comment)(limited 2/2 confusion and concern for patient safety) Patient left: in bed;with call bell/phone within reach Nurse Communication: Mobility status PT Visit Diagnosis: Other abnormalities of gait and mobility (R26.89);Pain Pain - Right/Left: Left Pain - part of body: Ankle and joints of foot     Time: 1517-1530 PT Time Calculation (min) (ACUTE ONLY): 13 min  Charges:                        Zenaida Niece, PT, DPT Acute Rehabilitation Pager: [phone]    Zenaida Niece 08/16/2019, 3:57 PM

## 2019-08-16 NOTE — Progress Notes (Signed)
Hospitalist progress note  If 7PM-7AM,  night-coverage-look on AMION -prefer pages-not epic jerrilynn, mikowski [phone] DOB: [birth year] DOA: 08/06/2019  PCP: [place], Sarah T, MD   Narrative:  63 year old Known peripheral vascular disease with multiple episodes of cellulitis and amputations-critical left leg ischemia status post angioplasty left SFA plus stent ESRD since 11/16/2017  TTS [place] ? MGUS Systolic + diastolic heart failure EF 35% Previous bilateral lower extremity DVTs 2019 HTN Prior C. 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 Continue Vanco and Zosyn as per vascular surgery Acute blood loss anemia from hematoma evacuation-blood loss 600 cc? Given 1 unit of PRBC--recheck labs a.m.--Rec'd 1 u PRBC 11/18 Will transfuse again 11/19 1 U prbc ESRD TTS Anemia of renal disease Secondary hyperparathyroidism phosphorus 5.1 Defer to renal Repeat labs in a.m. Continue PhosLo 3 times daily, Rocaltrol 0.25 MWF Previous bilateral lower extremity DVTs Continues on IV heparin at this time-defer when to switch back to oral anticoagulation to Vascular surgery-previously was on Brilinta 90 twice daily in addition to apixaban 2.5 twice daily Systolic, diastolic heart failure EF 35% moderate mitral regurg PAP 55 HTN Holding Coreg 12.5 currently, amlodipine 5 until pressures improved also holding hydralazine 25 3 times daily As needed metoprolol for heart rate above 130 Reflux continue pantoprazole 40 daily  Called son chirs Skillin [phone] on 11/19 Remain inpatient   Subjective: More awake somewhat coherent Labile hasnt eaten  Pain in leg ~ 7/10 No cp no feevr  Consultants:   Renal  Vascular  Ortho  Procedures:   Left femoral to AK popliteal bypass and then subsequent evacuation of hematoma 11/17 Antimicrobials:   Vancomycin and Zosyn Objective: Vitals:   08/16/19 1000 08/16/19 1030 08/16/19 1100 08/16/19 1130  BP: (!) 114/55 (!) 90/39 (!) 92/45 (!) 109/53  Pulse: (!) 103 75 76 80  Resp:      Temp:      TempSrc:      SpO2:      Weight:      Height:        Intake/Output Summary (Last 24 hours) at 08/16/2019 1325 Last data filed at 08/16/2019 0400 Gross per 24 hour  Intake 1363.69 ml  Output -  Net 1363.69 ml   Filed Weights   08/15/19 0240 08/16/19 0410 08/16/19 0800  Weight: 84 kg 84.9 kg 85.6 kg    Examination: less sleepy No distress Able to lift leg--pulses seem intact Not really hungry breakfast remains untouched Neurologically intact   Data Reviewed: I have personally reviewed following labs and imaging studies  Scheduled Meds: . sodium chloride   Intravenous Once  . acetaminophen  650 mg Oral Once  . atorvastatin  20 mg Oral QHS  . calcitRIOL  0.25 mcg Oral Q M,W,F-HD  . Chlorhexidine Gluconate Cloth  6 each Topical Q0600  . Chlorhexidine Gluconate Cloth  6 each Topical Q0600  . [START ON 08/18/2019] darbepoetin (ARANESP) injection - DIALYSIS  100 mcg Intravenous Q Sat-HD  . mupirocin ointment   Nasal BID  . pantoprazole  40 mg Oral Daily  . saccharomyces boulardii  250 mg Oral QHS  . vancomycin variable dose per unstable renal function (pharmacist dosing)   Does not  apply See admin instructions   Continuous Infusions: . sodium chloride 10 mL/hr at 08/14/19 1115  . DOPamine 2.5 mcg/kg/min (08/16/19 0400)  . heparin 1,900 Units/hr (08/16/19 1030)  . piperacillin-tazobactam (ZOSYN)  IV 2.25 g (08/16/19 0712)  . vancomycin Stopped (08/15/19 2227)    Radiology Studies: Reviewed images personally in health database    LOS: 10 days   Time spent: [place],  MD [facility]  08/16/2019, 1:25 PM

## 2019-08-17 LAB — CBC WITH DIFFERENTIAL/PLATELET
Abs Immature Granulocytes: 0.34 10*3/uL — ABNORMAL HIGH (ref 0.00–0.07)
Basophils Absolute: 0.1 10*3/uL (ref 0.0–0.1)
Basophils Relative: 1 %
Eosinophils Absolute: 0.2 10*3/uL (ref 0.0–0.5)
Eosinophils Relative: 1 %
HCT: 26 % — ABNORMAL LOW (ref 36.0–46.0)
Hemoglobin: 8.6 g/dL — ABNORMAL LOW (ref 12.0–15.0)
Immature Granulocytes: 2 %
Lymphocytes Relative: 7 %
Lymphs Abs: 1.4 10*3/uL (ref 0.7–4.0)
MCH: 30.2 pg (ref 26.0–34.0)
MCHC: 33.1 g/dL (ref 30.0–36.0)
MCV: 91.2 fL (ref 80.0–100.0)
Monocytes Absolute: 2 10*3/uL — ABNORMAL HIGH (ref 0.1–1.0)
Monocytes Relative: 10 %
Neutro Abs: 15.3 10*3/uL — ABNORMAL HIGH (ref 1.7–7.7)
Neutrophils Relative %: 79 %
Platelets: 406 10*3/uL — ABNORMAL HIGH (ref 150–400)
RBC: 2.85 MIL/uL — ABNORMAL LOW (ref 3.87–5.11)
RDW: 17.2 % — ABNORMAL HIGH (ref 11.5–15.5)
WBC: 19.2 10*3/uL — ABNORMAL HIGH (ref 4.0–10.5)
nRBC: 0 % (ref 0.0–0.2)

## 2019-08-17 LAB — RENAL FUNCTION PANEL
Albumin: 2.1 g/dL — ABNORMAL LOW (ref 3.5–5.0)
Anion gap: 16 — ABNORMAL HIGH (ref 5–15)
BUN: 16 mg/dL (ref 8–23)
CO2: 21 mmol/L — ABNORMAL LOW (ref 22–32)
Calcium: 7.7 mg/dL — ABNORMAL LOW (ref 8.9–10.3)
Chloride: 93 mmol/L — ABNORMAL LOW (ref 98–111)
Creatinine, Ser: 3.11 mg/dL — ABNORMAL HIGH (ref 0.44–1.00)
GFR calc Af Amer: 17 mL/min — ABNORMAL LOW (ref >60)
GFR calc non Af Amer: 15 mL/min — ABNORMAL LOW (ref >60)
Glucose, Bld: 112 mg/dL — ABNORMAL HIGH (ref 70–99)
Phosphorus: 3.9 mg/dL (ref 2.5–4.6)
Potassium: 3.6 mmol/L (ref 3.5–5.1)
Sodium: 130 mmol/L — ABNORMAL LOW (ref 135–145)

## 2019-08-17 LAB — BPAM RBC
Blood Product Expiration Date: 202012112359
Blood Product Expiration Date: 202012122359
Blood Product Expiration Date: 202012162359
Blood Product Expiration Date: 202012182359
ISSUE DATE / TIME: 202011171850
ISSUE DATE / TIME: 202011171850
ISSUE DATE / TIME: 202011182221
ISSUE DATE / TIME: 202011191648
Unit Type and Rh: 600
Unit Type and Rh: 600
Unit Type and Rh: 600
Unit Type and Rh: 600

## 2019-08-17 LAB — GLUCOSE, CAPILLARY
Glucose-Capillary: 119 mg/dL — ABNORMAL HIGH (ref 70–99)
Glucose-Capillary: 97 mg/dL (ref 70–99)
Glucose-Capillary: 134 mg/dL — ABNORMAL HIGH (ref 70–99)
Glucose-Capillary: 106 mg/dL — ABNORMAL HIGH (ref 70–99)
Glucose-Capillary: 107 mg/dL — ABNORMAL HIGH (ref 70–99)

## 2019-08-17 LAB — TYPE AND SCREEN
ABO/RH(D): AB NEG
Antibody Screen: NEGATIVE
Unit division: 0
Unit division: 0
Unit division: 0
Unit division: 0

## 2019-08-17 LAB — HEPARIN LEVEL (UNFRACTIONATED)
Heparin Unfractionated: <0.1 IU/mL — ABNORMAL LOW (ref 0.30–0.70)
Heparin Unfractionated: >2.2 IU/mL — ABNORMAL HIGH (ref 0.30–0.70)
Heparin Unfractionated: 0.98 IU/mL — ABNORMAL HIGH (ref 0.30–0.70)

## 2019-08-17 LAB — VANCOMYCIN, RANDOM: Vancomycin Rm: 25

## 2019-08-17 MED ORDER — OXYCODONE HCL 5 MG PO TABS
5.0000 mg | ORAL_TABLET | ORAL | Status: DC | PRN
  Administered 2019-08-20 – 2019-08-21 (×2): 5 mg via ORAL
  Filled 2019-08-17 (×2): qty 1

## 2019-08-17 MED ORDER — VANCOMYCIN HCL IN DEXTROSE 750-5 MG/150ML-% IV SOLN: 750.0000 mg | INTRAVENOUS | Status: DC

## 2019-08-17 NOTE — Progress Notes (Signed)
[place] for Heparin  Indication: History of  DVT   Allergies  Allergen Reactions  . Crestor [Rosuvastatin Calcium] Other (See Comments)    Leg pain    Patient Measurements: Height: 5\' 9"  (175.3 cm) Weight: 184 lb 11.9 oz (83.8 kg) IBW/kg (Calculated) : 66.2 Heparin Dosing Weight: 84 kg  Vital Signs: Temp: 97.7 F (36.5 C) (11/20 1938) Temp Source: Oral (11/20 1938) BP: 118/53 (11/20 1938) Pulse Rate: 99 (11/20 1938)  Labs: Recent Labs    08/15/19 0315  08/15/19 1743 08/16/19 0547  08/17/19 0814 08/17/19 1839 08/17/19 2029  HGB 9.7*  --  7.6* 7.9*  --  8.6*  --   --   HCT 29.5*  --  23.8* 24.8*  --  26.0*  --   --   PLT 435*  --   --  374  --  406*  --   --   HEPARINUNFRC  --    < > 1.40* 0.17*   < > <0.10* >2.20* 0.98*  CREATININE 3.80*  --   --  4.75*  --  3.11*  --   --    < > = values in this interval not displayed.    Estimated Creatinine Clearance: 21.1 mL/min (A) (by C-G formula based on SCr of 3.11 mg/dL (H)).   Assessment: 63 yr old female on Eliquis PTA for hx DVT. Patient is s/p vein graft bypass and L great toe amputation on 08/14/19, and then returned to the OR for evacuation later that night.  Pharmacy consulted to dose IV heparin while Eliquis is on hold.  Heparin level drawn ~8.5 hrs after heparin infusion was increased to 2400 units/hr was >2.20 units/ml, which is above the desired goal range. Per RN, no issues with IV or bleeding observed. In speaking with phlebotomist, she told me that she had to draw the heparin level from the same arm in which the heparin was infusing because pt cannot have blood draws in opposite arm. Heparin infusion was stopped while repeat STAT heparin level was drawn; that level was drawn while the infusion was stopped and resulted at 0.98 units/ml ~45 minutes ago, while is above the desired goal range.  Goal of Therapy:  Heparin level 0.3-0.7 units/ml Monitor platelets by  anticoagulation protocol: Yes   Plan:  Reduce heparin infusion to 2200 units/hr Check 8 hr heparin level Monitor daily heparin level, CBC Monitor for signs/symptoms of bleeding  Gillermina Hu, PharmD, BCPS, [facility] Clinical Pharmacist 08/17/2019, 9:07 PM

## 2019-08-17 NOTE — Progress Notes (Signed)
ANTICOAGULATION CONSULT NOTE - Follow Up Consult  Pharmacy Consult for Heparin  Indication: h/o DVT   Allergies  Allergen Reactions  . Crestor [Rosuvastatin Calcium] Other (See Comments)    Leg pain    Patient Measurements: Height: 5\' 9"  (175.3 cm) Weight: 184 lb 11.9 oz (83.8 kg) IBW/kg (Calculated) : 66.2 Heparin Dosing Weight: 84 kg  Vital Signs: Temp: 97.8 F (36.6 C) (11/19 1945) Temp Source: Oral (11/19 1945) BP: 119/60 (11/19 1945) Pulse Rate: 113 (11/19 1945)  Labs: Recent Labs    08/14/19 0249  08/15/19 0315 08/15/19 1743 08/16/19 0547 08/16/19 1351 08/16/19 2325  HGB 9.4*   < > 9.7* 7.6* 7.9*  --   --   HCT 29.7*   < > 29.5* 23.8* 24.8*  --   --   PLT 513*  --  435*  --  374  --   --   HEPARINUNFRC 0.56  --   --  1.40* 0.17* 0.24* <0.10*  CREATININE 2.68*  --  3.80*  --  4.75*  --   --    < > = values in this interval not displayed.    Estimated Creatinine Clearance: 13.8 mL/min (A) (by C-G formula based on SCr of 4.75 mg/dL (H)).   Assessment: 64 yr old female PTA for hx DVT on hold for procedure (LD 11/8 PM). Patient now s/p vein graft bypass and L great toe amputation yesterday. Hematoma post op and patient returned to OR for evacuation late last night. Discussed with surgery this AM, ok to resume heparin, will decrease rate slightly to aim for low end of goal no boluses. Hgb 9.7, received 1 unit overnight.   Heparin level drawn ~8 hrs after restarting heparin infusion at 2100 units/hr was 1.4 units/ml, which is above the goal range for this pt. Per RN, no issues with IV or bleeding observed. H/H drawn this afternoon at the same time as the heparin level were 7.6/23.8.  11/20 AM update: Heparin level low, no issues per RN  Goal of Therapy:  Heparin level 0.3-0.7 units/ml Monitor platelets by anticoagulation protocol: Yes   Plan:  Inc heparin to 2200 units/hr Re-check heparin level in 8 hours  Narda Bonds, PharmD, [place]  Pharmacist Phone: [phone]

## 2019-08-17 NOTE — Progress Notes (Signed)
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 difficile colitis/2019  Admitted with worsening left great toe gangrene Vascular, Ortho, renal consulted  Assessment & Plan: Sepsis secondary to cellulitis osteomyelitis and left great toe gangrene Status post left femoral to AK pop bypass with hematoma evacuation + debridement skin left foot White count is finally dropping from the 30 range a couple of days ago to 19 hopefully no further surgery is needed her pain is not controlled but she is only asking for meds periodically I encouraged her to ask for Continue Vanco and Zosyn as per vascular surgery Patient is at high risk for further amputation so based on my discussion with Dr. Trula Slade we will keep on IV heparin and see how the wound demarcates per him She is not taking pain meds regularly and is in pain, I have encouraged her to ask for opiates as needed and meds have been changed Acute blood loss anemia from hematoma evacuation-blood loss 600 cc? Given 1 unit of PRBC--recheck labs a.m.--Rec'd 1 u PRBC 11/18 Will transfuse again 11/19 1 U prbc Hemoglobin has trended upward to 8.6 Repeat labs in a.m. ESRD TTS Anemia of renal disease Secondary hyperparathyroidism phosphorus 5.1 Defer to renal Repeat labs in a.m. Continue PhosLo 3 times daily, Rocaltrol 0.25 MWF Previous bilateral lower extremity DVTs Continues on IV heparin at this time-defer when to switch back to oral anticoagulation to Vascular surgery-previously was on  Brilinta 90 twice daily in addition to apixaban 2.5 twice daily Systolic, diastolic heart failure EF 35% moderate mitral regurg PAP 55 HTN Holding Coreg 12.5 currently, amlodipine 5 until pressures improved also holding hydralazine 25 3 times daily As needed metoprolol for heart rate above 130 Reflux continue pantoprazole 40 daily  Called son chirs Wipperfurth [phone] on 11/19-no answer Remain inpatient   Subjective:  Less sleepy more coherent in some amount of pain not eating or drinking not constipated just not hungry No fever no chills Does not recall the conversation about need for maybe further surgery if she does not improve Overall she is better  Consultants:   Renal  Vascular  Ortho  Procedures:   Left femoral to AK popliteal bypass and then subsequent evacuation of hematoma 11/17 Antimicrobials:   Vancomycin and Zosyn Objective: Vitals:   08/16/19 1755 08/16/19 1844 08/16/19 1945 08/17/19 0344  BP: (!) 103/51  119/60 (!) 124/54  Pulse: (!) 122 (!) 118 (!) 113 100  Resp:   15 15  Temp:   97.8 F (36.6 C) 97.8 F (36.6 C)  TempSrc:   Oral Oral  SpO2: 95% 97% 98% 97%  Weight:      Height:        Intake/Output Summary (Last 24 hours) at 08/17/2019 0836 Last data filed at 08/16/2019 1945 Gross per 24 hour  Intake 335 ml  Output 2000 ml  Net -1665 ml   Filed Weights   08/16/19 0410 08/16/19 0800 08/16/19 1212  Weight: 84.9 kg 85.6 kg 83.8 kg    Examination: Awake alert more coherent S1-S2 no murmur rub or gallop dialysis access in right chest  Abdomen soft no rebound Chest clear Left lower extremity has a wound VAC in place  Data Reviewed: I have personally reviewed following labs and imaging studies  Scheduled Meds: . sodium chloride   Intravenous Once  . atorvastatin  20 mg Oral QHS  . calcitRIOL  0.25 mcg Oral Q M,W,F-HD  . Chlorhexidine Gluconate Cloth  6 each Topical Q0600  . Chlorhexidine Gluconate Cloth  6 each Topical Q0600  . [START ON  08/18/2019] darbepoetin (ARANESP) injection - DIALYSIS  100 mcg Intravenous Q Sat-HD  . mupirocin ointment   Nasal BID  . pantoprazole  40 mg Oral Daily  . saccharomyces boulardii  250 mg Oral QHS  . vancomycin variable dose per unstable renal function (pharmacist dosing)   Does not apply See admin instructions   Continuous Infusions: . sodium chloride 10 mL/hr at 08/14/19 1115  . heparin 2,200 Units/hr (08/17/19 0012)  . piperacillin-tazobactam (ZOSYN)  IV 2.25 g (08/17/19 0522)    Radiology Studies: Reviewed images personally in health database    LOS: 11 days   Time spent: [place], MD [facility]  08/17/2019, 8:36 AM

## 2019-08-17 NOTE — Progress Notes (Signed)
[place] for Heparin  Indication: h/o DVT   Allergies  Allergen Reactions  . Crestor [Rosuvastatin Calcium] Other (See Comments)    Leg pain    Patient Measurements: Height: 5\' 9"  (175.3 cm) Weight: 184 lb 11.9 oz (83.8 kg) IBW/kg (Calculated) : 66.2 Heparin Dosing Weight: 84 kg  Vital Signs: Temp: 97.8 F (36.6 C) (11/20 0344) Temp Source: Oral (11/20 0344) BP: 124/54 (11/20 0344) Pulse Rate: 100 (11/20 0344)  Labs: Recent Labs    08/15/19 0315  08/15/19 1743 08/16/19 0547 08/16/19 1351 08/16/19 2325 08/17/19 0814  HGB 9.7*  --  7.6* 7.9*  --   --  8.6*  HCT 29.5*  --  23.8* 24.8*  --   --  26.0*  PLT 435*  --   --  374  --   --  406*  HEPARINUNFRC  --    < > 1.40* 0.17* 0.24* <0.10* <0.10*  CREATININE 3.80*  --   --  4.75*  --   --  3.11*   < > = values in this interval not displayed.    Estimated Creatinine Clearance: 21.1 mL/min (A) (by C-G formula based on SCr of 3.11 mg/dL (H)).   Assessment: 63 yr old female on Eliquis PTA for hx DVT. Patient is s/p vein graft bypass and L great toe amputation on 08/14/19, and then returned to the OR for evacuation late that night.  Pharmacy consulted to dose IV heparin while Eliquis is on hold.  Heparin level is undetectable despite being therapeutic on this rate in the past.  No issue with heparin infusion nor lab draw per RN.  No bleeding reported.  Goal of Therapy:  Heparin level 0.3-0.7 units/ml Monitor platelets by anticoagulation protocol: Yes   Plan:  Increase heparin infusion conservatively to 2400 units/hr Check 8 hr heparin level Daily heparin level and CBC  Cattleya Dobratz D. Mina Marble, PharmD, BCPS, [place] 08/17/2019, 9:52 AM

## 2019-08-17 NOTE — Progress Notes (Signed)
Subjective:  Completed HD yest- removed 2 liters-  More alert today -  Off dopamine-  Having pain - getting pain med  Objective Vital signs in last 24 hours: Vitals:   08/16/19 1755 08/16/19 1844 08/16/19 1945 08/17/19 0344  BP: (!) 103/51  119/60 (!) 124/54  Pulse: (!) 122 (!) 118 (!) 113 100  Resp:   15 15  Temp:   97.8 F (36.6 C) 97.8 F (36.6 C)  TempSrc:   Oral Oral  SpO2: 95% 97% 98% 97%  Weight:      Height:       Weight change: 0.7 kg  Intake/Output Summary (Last 24 hours) at 08/17/2019 0623 Last data filed at 08/16/2019 1945 Gross per 24 hour  Intake 335 ml  Output 2000 ml  Net -1665 ml    Outpt HD:MWF [facility]  4h 350/800 80kg 2/2.25 bath TDC Heparin 2400 -LUE AVF (poorly matured, not usingUNTIL LOWER EXTREM Infect/ PAD issue stable ) - calc 0.25 tiw   Assessment/ Plan: Pt is a 63 y.o. yo female with ESRD who was admitted on 08/06/2019 with foot gangrene  Assessment/Plan: 1. Foot gangrene-  Had revasc procedure and toe amp on 11/17- had to go back to OR for hematoma-  vanc and zosyn and pain control  2. ESRD - normally MWF via TDC.  Ran Tues and Thurs off schedule this week due to instability after OR-  Holiday schedule this coming week.  MWF will be Sun/Tues/Fri-  May wait until Sun to do next treatment 3. Anemia- finally did drop - low dose ESA, will inc dose - cont.  No iron due to infection -  Got 2 units 11/17, one unit 11/18  And on 11/19 4. Secondary hyperparathyroidism- resumed calcitriol - wait on phoslo  5. HTN/volume- now off dopamine.  Low sodium argues for volume overload   Kristen Berry    Labs: Basic Metabolic Panel: Recent Labs  Lab 08/14/19 0249  08/15/19 0315 08/16/19 0547 08/17/19 0814  NA 135   < > 135 133* 130*  K 3.8   < > 4.6 4.2 3.6  CL 96*  --  101 97* 93*  CO2 24  --  20* 18* 21*  GLUCOSE 148*  --  181* 123* 112*  BUN 12  --  25* 31* 16  CREATININE 2.68*  --  3.80* 4.75* 3.11*  CALCIUM 8.4*  --  7.7*  7.5* 7.7*  PHOS 2.6  --  5.1*  --  3.9   < > = values in this interval not displayed.   Liver Function Tests: Recent Labs  Lab 08/14/19 0249 08/15/19 0315 08/17/19 0814  ALBUMIN 2.1* 2.5* 2.1*   No results for input(s): LIPASE, AMYLASE in the last 168 hours. No results for input(s): AMMONIA in the last 168 hours. CBC: Recent Labs  Lab 08/13/19 0237 08/14/19 0249  08/15/19 0315 08/15/19 1743 08/16/19 0547 08/17/19 0814  WBC 25.9* 24.5*  --  31.7*  --  21.7* 19.2*  NEUTROABS  --   --   --   --   --   --  15.3*  HGB 9.5* 9.4*   < > 9.7* 7.6* 7.9* 8.6*  HCT 29.8* 29.7*   < > 29.5* 23.8* 24.8* 26.0*  MCV 95.5 96.1  --  94.6  --  95.4 91.2  PLT 549* 513*  --  435*  --  374 406*   < > = values in this interval not displayed.   Cardiac Enzymes: No  results for input(s): CKTOTAL, CKMB, CKMBINDEX, TROPONINI in the last 168 hours. CBG: Recent Labs  Lab 08/16/19 1710 08/16/19 2039 08/16/19 2350 08/17/19 0504 08/17/19 0853  GLUCAP 146* 137* 121* 119* 97    Iron Studies: No results for input(s): IRON, TIBC, TRANSFERRIN, FERRITIN in the last 72 hours. Studies/Results: Vas [place] Abi With/wo Tbi  Result Date: 08/15/2019 LOWER EXTREMITY DOPPLER STUDY Indications: Peripheral artery disease.  Vascular Interventions: 08/14/2019 - LEFT FEMORAL-POPLITEAL ARTERY Bypass Graft                         AMPUTATION LEFT GREAT TOE                          Application Of Wound Vac to left foot. Limitations: Today's exam was limited due to patient intolerant to cuff pressure              and Right BKA, Wound vac on left foot. Comparison Study: 06/26/2019 - R BKA L 0.29 Performing Technologist: Carlos Levering Rvt  Examination Guidelines: A complete evaluation includes at minimum, Doppler waveform signals and systolic blood pressure reading at the level of bilateral brachial, anterior tibial, and posterior tibial arteries, when vessel segments are accessible. Bilateral testing is considered an integral part  of a complete examination. Photoelectric Plethysmograph (PPG) waveforms and toe systolic pressure readings are included as required and additional duplex testing as needed. Limited examinations for reoccurring indications may be performed as noted.  ABI Findings: +--------+------------------+-----+--------+--------+ Right   Rt Pressure (mmHg)IndexWaveformComment  +--------+------------------+-----+--------+--------+ Brachial99                     biphasic         +--------+------------------+-----+--------+--------+ PTA                                    BKA      +--------+------------------+-----+--------+--------+ DP                                     BKA      +--------+------------------+-----+--------+--------+ +--------+------------------+-----+----------+--------------+ Left    Lt Pressure (mmHg)IndexWaveform  Comment        +--------+------------------+-----+----------+--------------+ Brachial                                 Restricted arm +--------+------------------+-----+----------+--------------+ PTA     53                0.54 biphasic                 +--------+------------------+-----+----------+--------------+ DP      61                0.62 monophasic               +--------+------------------+-----+----------+--------------+ +-------+-----------+-----------+------------+------------+ ABI/TBIToday's ABIToday's TBIPrevious ABIPrevious TBI +-------+-----------+-----------+------------+------------+ Left   0.62                  0.29                     +-------+-----------+-----------+------------+------------+  Summary: Right: Unable to obtain pressures and waveforms due to BKA. Left: Resting left ankle-brachial index indicates moderate left lower extremity arterial disease.  *See  table(s) above for measurements and observations.     Preliminary    Medications: Infusions: . sodium chloride 10 mL/hr at 08/14/19 1115  . heparin 2,200 Units/hr  (08/17/19 0012)  . piperacillin-tazobactam (ZOSYN)  IV 2.25 g (08/17/19 0522)    Scheduled Medications: . sodium chloride   Intravenous Once  . atorvastatin  20 mg Oral QHS  . calcitRIOL  0.25 mcg Oral Q M,W,F-HD  . Chlorhexidine Gluconate Cloth  6 each Topical Q0600  . Chlorhexidine Gluconate Cloth  6 each Topical Q0600  . [START ON 08/18/2019] darbepoetin (ARANESP) injection - DIALYSIS  100 mcg Intravenous Q Sat-HD  . mupirocin ointment   Nasal BID  . pantoprazole  40 mg Oral Daily  . saccharomyces boulardii  250 mg Oral QHS  . vancomycin variable dose per unstable renal function (pharmacist dosing)   Does not apply See admin instructions    have reviewed scheduled and prn medications.  Physical Exam: General: more alert but now confused  Heart: RRR Lungs: mostly clear Abdomen: non tender  Extremities: some dep  edema Dialysis Access: right sided TDC-  Also with left AVF-  Weak thrill and bruit     08/17/2019,9:52 AM  LOS: 11 days

## 2019-08-17 NOTE — Progress Notes (Addendum)
CONSULT NOTE - Follow Up Consult  Pharmacy Consult forVanco/Zosyn Indication:  dry gangrene/osteo of toe  Allergies  Allergen Reactions  . Crestor [Rosuvastatin Calcium] Other (See Comments)    Leg pain    Patient Measurements: Height: 5\' 9"  (175.3 cm) Weight: 184 lb 11.9 oz (83.8 kg) IBW/kg (Calculated) : 66.2  Vital Signs: Temp: 97.7 F (36.5 C) (11/20 1938) Temp Source: Oral (11/20 1938) BP: 118/53 (11/20 1938) Pulse Rate: 99 (11/20 1938)  Labs: Recent Labs    08/15/19 0315  08/15/19 1743 08/16/19 0547 08/16/19 1351 08/16/19 2325 08/17/19 0814  HGB 9.7*  --  7.6* 7.9*  --   --  8.6*  HCT 29.5*  --  23.8* 24.8*  --   --  26.0*  PLT 435*  --   --  374  --   --  406*  HEPARINUNFRC  --    < > 1.40* 0.17* 0.24* <0.10* <0.10*  CREATININE 3.80*  --   --  4.75*  --   --  3.11*   < > = values in this interval not displayed.    Estimated Creatinine Clearance: 21.1 mL/min (A) (by C-G formula based on SCr of 3.11 mg/dL (H)).   Assessment: 63 yo female on vancomycin for cellulitis/osteo L great toe with dry gangrene. She is noted with HD normally MWF. -Last HD was 11/19 (missed 11/18) -vancomycin given 11/18 (per Epic, 2 doses were charted on 11/18 - one dose at 13:14 and another dose at 21:27  -WBC= 19.2, afebrile  Random vancomycin level drawn at 18:30 PM this evening (11/20) was 25 mg/L, which is within goal range  11/10 vanc>> 11/9 zosyn>>  1/9 BCx - negF 11/9 covid - neg 11/10 MRSA PCR: pos 11/9: COVID negative 11/16 MRSA PCR pos  Goal of Therapy:  Vanco peak >20 post-HD   Plan:  -Continue vancomycin 750 mg IV with each HD -Zosyn 2.25 gm IV Q 8 hrs -Will follow HD schedule and clinical progress  Gillermina Hu, PharmD, BCPS, [facility] Clinical Pharmacist 08/17/19, 20:01 PM

## 2019-08-17 NOTE — Progress Notes (Addendum)
  Progress Note    08/17/2019 8:10 AM 3 Days Post-Op  Subjective:  Says she is having pain in the foot  afebrile  Vitals:   08/16/19 1945 08/17/19 0344  BP: 119/60 (!) 124/54  Pulse: (!) 113 100  Resp: 15 15  Temp: 97.8 F (36.6 C) 97.8 F (36.6 C)  SpO2: 98% 97%    Physical Exam: Cardiac:  regular Lungs:  Non labored Extremities:  Monophasic doppler signals left DP/PT/peroneal; wound vac with good seal.        CBC    Component Value Date/Time   WBC 21.7 (H) 08/16/2019 0547   RBC 2.60 (L) 08/16/2019 0547   HGB 7.9 (L) 08/16/2019 0547   HGB 14.3 12/14/2016 1139   HCT 24.8 (L) 08/16/2019 0547   HCT 44.2 12/14/2016 1139   PLT 374 08/16/2019 0547   PLT 429 (H) 12/14/2016 1139   MCV 95.4 08/16/2019 0547   MCV 88.5 12/14/2016 1139   MCH 30.4 08/16/2019 0547   MCHC 31.9 08/16/2019 0547   RDW 17.3 (H) 08/16/2019 0547   RDW 17.6 (H) 12/14/2016 1139   LYMPHSABS 1.6 08/06/2019 2058   LYMPHSABS 1.9 12/14/2016 1139   MONOABS 1.6 (H) 08/06/2019 2058   MONOABS 0.7 12/14/2016 1139   EOSABS 0.2 08/06/2019 2058   EOSABS 0.0 10/17/2017 1430   BASOSABS 0.1 08/06/2019 2058   BASOSABS 0.1 12/14/2016 1139    BMET    Component Value Date/Time   NA 133 (L) 08/16/2019 0547   NA 137 12/14/2016 1139   K 4.2 08/16/2019 0547   K 4.2 12/14/2016 1139   CL 97 (L) 08/16/2019 0547   CO2 18 (L) 08/16/2019 0547   CO2 20 (L) 12/14/2016 1139   GLUCOSE 123 (H) 08/16/2019 0547   GLUCOSE 115 12/14/2016 1139   BUN 31 (H) 08/16/2019 0547   BUN 32.8 (H) 12/14/2016 1139   CREATININE 4.75 (H) 08/16/2019 0547   CREATININE 2.1 (H) 12/14/2016 1139   CALCIUM 7.5 (L) 08/16/2019 0547   CALCIUM 8.3 (L) 11/11/2017 0740   CALCIUM 9.5 12/14/2016 1139   GFRNONAA 9 (L) 08/16/2019 0547   GFRAA 10 (L) 08/16/2019 0547    INR    Component Value Date/Time   INR 1.6 (H) 08/07/2019 0529     Intake/Output Summary (Last 24 hours) at 08/17/2019 0810 Last data filed at 08/16/2019 1945 Gross per  24 hour  Intake 335 ml  Output 2000 ml  Net -1665 ml     Assessment:  63 y.o. female is s/p:  L femoral to AK pop bypass with vein with subsequent hematoma evacuation L groin  3 Days Post-Op  Plan: -pt with monophasic doppler signals left DP/PT/peroneal -will take wound vac off later today to inspect wound.  RN is ordering wound vac for change -leukocytosis significantly improved yesterday but still elevated.  Pt is afebrile.  -labs being drawn while in the room -DVT prophylaxis:  Heparin gtt   Leontine Locket, PA-C [facility] [phone] 08/17/2019 8:10 AM  ADDENDUM:  Overall, wound okay-will ask WOC to place vac back on foot.    Vac removed.  The wound looks surprisingly good with the exception of a little necrotic area at the base.  We will continue with the vac.  She is weight bearing on her heel only.  SHe is at risk for BKA, but will try and see if this can heel.  Annamarie Major

## 2019-08-17 NOTE — Consult Note (Signed)
[facility] Nurse Consult Note: Vascular team following for assessment and plan of care.  Requested to reapply Vac to left foot. Reason for Consult: Full thickness post-op wound to left plantar foot Measurement: 8X7X1cm Wound bed: 15% black interspersed throughout wound, 85% red Drainage (amount, consistency, odor) small amt pink drainage in the cannister, no odor Periwound: intact skin surrounding Dressing procedure/placement/frequency: Applied barrier ring around the edge of the wound to attempt to maintain a seal, and one piece black foam to 172mm cont suction. Pt tolerated with minimal amt discomfort after being medicated earlier.   [place] team will plan to change dressing Q M/W/F. Julien Girt MSN, RN, [place], [place], [place]

## 2019-08-18 ENCOUNTER — Inpatient Hospital Stay (HOSPITAL_COMMUNITY): Payer: Medicare HMO

## 2019-08-18 LAB — CBC
HCT: 28 % — ABNORMAL LOW (ref 36.0–46.0)
Hemoglobin: 9.2 g/dL — ABNORMAL LOW (ref 12.0–15.0)
MCH: 30.8 pg (ref 26.0–34.0)
MCHC: 32.9 g/dL (ref 30.0–36.0)
MCV: 93.6 fL (ref 80.0–100.0)
Platelets: 392 10*3/uL (ref 150–400)
RBC: 2.99 MIL/uL — ABNORMAL LOW (ref 3.87–5.11)
RDW: 17.3 % — ABNORMAL HIGH (ref 11.5–15.5)
WBC: 19.1 10*3/uL — ABNORMAL HIGH (ref 4.0–10.5)
nRBC: 0.1 % (ref 0.0–0.2)

## 2019-08-18 LAB — BASIC METABOLIC PANEL
Anion gap: 17 — ABNORMAL HIGH (ref 5–15)
BUN: 24 mg/dL — ABNORMAL HIGH (ref 8–23)
CO2: 19 mmol/L — ABNORMAL LOW (ref 22–32)
Calcium: 7.8 mg/dL — ABNORMAL LOW (ref 8.9–10.3)
Chloride: 93 mmol/L — ABNORMAL LOW (ref 98–111)
Creatinine, Ser: 4.08 mg/dL — ABNORMAL HIGH (ref 0.44–1.00)
GFR calc Af Amer: 13 mL/min — ABNORMAL LOW (ref >60)
GFR calc non Af Amer: 11 mL/min — ABNORMAL LOW (ref >60)
Glucose, Bld: 114 mg/dL — ABNORMAL HIGH (ref 70–99)
Potassium: 3.9 mmol/L (ref 3.5–5.1)
Sodium: 129 mmol/L — ABNORMAL LOW (ref 135–145)

## 2019-08-18 LAB — HEPARIN LEVEL (UNFRACTIONATED)
Heparin Unfractionated: 0.49 IU/mL (ref 0.30–0.70)
Heparin Unfractionated: 0.43 IU/mL (ref 0.30–0.70)

## 2019-08-18 LAB — CBC WITH DIFFERENTIAL/PLATELET
Abs Immature Granulocytes: 0.38 10*3/uL — ABNORMAL HIGH (ref 0.00–0.07)
Basophils Absolute: 0.1 10*3/uL (ref 0.0–0.1)
Basophils Relative: 1 %
Eosinophils Absolute: 0.2 10*3/uL (ref 0.0–0.5)
Eosinophils Relative: 1 %
HCT: 28.1 % — ABNORMAL LOW (ref 36.0–46.0)
Hemoglobin: 9.2 g/dL — ABNORMAL LOW (ref 12.0–15.0)
Immature Granulocytes: 2 %
Lymphocytes Relative: 5 %
Lymphs Abs: 1.2 10*3/uL (ref 0.7–4.0)
MCH: 30.5 pg (ref 26.0–34.0)
MCHC: 32.7 g/dL (ref 30.0–36.0)
MCV: 93 fL (ref 80.0–100.0)
Monocytes Absolute: 2 10*3/uL — ABNORMAL HIGH (ref 0.1–1.0)
Monocytes Relative: 9 %
Neutro Abs: 18.5 10*3/uL — ABNORMAL HIGH (ref 1.7–7.7)
Neutrophils Relative %: 82 %
Platelets: 425 10*3/uL — ABNORMAL HIGH (ref 150–400)
RBC: 3.02 MIL/uL — ABNORMAL LOW (ref 3.87–5.11)
RDW: 17.4 % — ABNORMAL HIGH (ref 11.5–15.5)
WBC: 22.5 10*3/uL — ABNORMAL HIGH (ref 4.0–10.5)
nRBC: 0 % (ref 0.0–0.2)

## 2019-08-18 LAB — GLUCOSE, CAPILLARY
Glucose-Capillary: 106 mg/dL — ABNORMAL HIGH (ref 70–99)
Glucose-Capillary: 108 mg/dL — ABNORMAL HIGH (ref 70–99)
Glucose-Capillary: 108 mg/dL — ABNORMAL HIGH (ref 70–99)
Glucose-Capillary: 113 mg/dL — ABNORMAL HIGH (ref 70–99)
Glucose-Capillary: 114 mg/dL — ABNORMAL HIGH (ref 70–99)
Glucose-Capillary: 141 mg/dL — ABNORMAL HIGH (ref 70–99)
Glucose-Capillary: 48 mg/dL — ABNORMAL LOW (ref 70–99)

## 2019-08-18 LAB — RENAL FUNCTION PANEL
Albumin: 2.1 g/dL — ABNORMAL LOW (ref 3.5–5.0)
Anion gap: 17 — ABNORMAL HIGH (ref 5–15)
BUN: 29 mg/dL — ABNORMAL HIGH (ref 8–23)
CO2: 16 mmol/L — ABNORMAL LOW (ref 22–32)
Calcium: 7.7 mg/dL — ABNORMAL LOW (ref 8.9–10.3)
Chloride: 94 mmol/L — ABNORMAL LOW (ref 98–111)
Creatinine, Ser: 4.36 mg/dL — ABNORMAL HIGH (ref 0.44–1.00)
GFR calc Af Amer: 12 mL/min — ABNORMAL LOW (ref >60)
GFR calc non Af Amer: 10 mL/min — ABNORMAL LOW (ref >60)
Glucose, Bld: 128 mg/dL — ABNORMAL HIGH (ref 70–99)
Phosphorus: 5.2 mg/dL — ABNORMAL HIGH (ref 2.5–4.6)
Potassium: 4.1 mmol/L (ref 3.5–5.1)
Sodium: 127 mmol/L — ABNORMAL LOW (ref 135–145)

## 2019-08-18 MED ORDER — DARBEPOETIN ALFA 100 MCG/0.5ML IJ SOSY
100.0000 ug | PREFILLED_SYRINGE | INTRAMUSCULAR | Status: DC
  Administered 2019-08-19: 100 ug via INTRAVENOUS

## 2019-08-18 MED ORDER — ASPIRIN EC 81 MG PO TBEC
81.0000 mg | DELAYED_RELEASE_TABLET | Freq: Every day | ORAL | Status: DC
  Administered 2019-08-18 – 2019-08-29 (×12): 81 mg via ORAL
  Filled 2019-08-18 (×11): qty 1

## 2019-08-18 MED ORDER — CHLORHEXIDINE GLUCONATE CLOTH 2 % EX PADS
6.0000 | MEDICATED_PAD | Freq: Every day | CUTANEOUS | Status: DC
  Administered 2019-08-18 – 2019-08-29 (×9): 6 via TOPICAL

## 2019-08-18 MED ORDER — CARVEDILOL 6.25 MG PO TABS
6.2500 mg | ORAL_TABLET | Freq: Two times a day (BID) | ORAL | Status: DC
  Administered 2019-08-19 – 2019-08-28 (×17): 6.25 mg via ORAL
  Filled 2019-08-18 (×16): qty 1

## 2019-08-18 NOTE — Progress Notes (Signed)
Subjective:  No new c/o's -  BP staying up - pain is better-  Less confused  Objective Vital signs in last 24 hours: Vitals:   08/17/19 0344 08/17/19 1938 08/18/19 0007 08/18/19 0409  BP: (!) 124/54 (!) 118/53 (!) 120/58 135/66  Pulse: 100 99  (!) 111  Resp: 15 17  20   Temp: 97.8 F (36.6 C) 97.7 F (36.5 C) 97.6 F (36.4 C) 97.8 F (36.6 C)  TempSrc: Oral Oral  Oral  SpO2: 97% 97%  96%  Weight:      Height:       Weight change:   Intake/Output Summary (Last 24 hours) at 08/18/2019 1110 Last data filed at 08/17/2019 2326 Gross per 24 hour  Intake 214 ml  Output -  Net 214 ml    Outpt HD:MWF [facility]  4h 350/800 80kg 2/2.25 bath TDC Heparin 2400 -LUE AVF (poorly matured, not usingUNTIL LOWER EXTREM Infect/ PAD issue stable ) - calc 0.25 tiw   Assessment/ Plan: Pt is a 63 y.o. yo female with ESRD who was admitted on 08/06/2019 with foot gangrene  Assessment/Plan: 1. Foot gangrene-  Had revasc procedure and toe amp on 11/17- had to go back to OR for hematoma-  vanc and zosyn and pain control - is ongoing  2. ESRD - normally MWF via TDC.  Ran Tues and Thurs off schedule this week due to instability after OR-  Holiday schedule this coming week.  MWF will be Sun/Tues/Fri-  wait until Sun  (tomorrow) to do next treatment 3. Anemia- finally did drop - low dose ESA, will inc dose - cont.  No iron due to infection -  Got 2 units 11/17, one unit 11/18  And on 11/19--hgb 9.2 today  4. Secondary hyperparathyroidism- resumed calcitriol - wait on phoslo  5. HTN/volume- now off dopamine.  Low sodium argues for volume overload - will UF as able tomorrow  Louis Meckel    Labs: Basic Metabolic Panel: Recent Labs  Lab 08/14/19 0249  08/15/19 0315 08/16/19 0547 08/17/19 0814 08/18/19 0632  NA 135   < > 135 133* 130* 129*  K 3.8   < > 4.6 4.2 3.6 3.9  CL 96*  --  101 97* 93* 93*  CO2 24  --  20* 18* 21* 19*  GLUCOSE 148*  --  181* 123* 112* 114*  BUN 12  --   25* 31* 16 24*  CREATININE 2.68*  --  3.80* 4.75* 3.11* 4.08*  CALCIUM 8.4*  --  7.7* 7.5* 7.7* 7.8*  PHOS 2.6  --  5.1*  --  3.9  --    < > = values in this interval not displayed.   Liver Function Tests: Recent Labs  Lab 08/14/19 0249 08/15/19 0315 08/17/19 0814  ALBUMIN 2.1* 2.5* 2.1*   No results for input(s): LIPASE, AMYLASE in the last 168 hours. No results for input(s): AMMONIA in the last 168 hours. CBC: Recent Labs  Lab 08/14/19 0249  08/15/19 0315  08/16/19 0547 08/17/19 0814 08/18/19 0632  WBC 24.5*  --  31.7*  --  21.7* 19.2* 22.5*  NEUTROABS  --   --   --   --   --  15.3* 18.5*  HGB 9.4*   < > 9.7*   < > 7.9* 8.6* 9.2*  HCT 29.7*   < > 29.5*   < > 24.8* 26.0* 28.1*  MCV 96.1  --  94.6  --  95.4 91.2 93.0  PLT 513*  --  435*  --  374 406* 425*   < > = values in this interval not displayed.   Cardiac Enzymes: No results for input(s): CKTOTAL, CKMB, CKMBINDEX, TROPONINI in the last 168 hours. CBG: Recent Labs  Lab 08/17/19 1704 08/17/19 2014 08/18/19 0016 08/18/19 0420 08/18/19 0834  GLUCAP 106* 107* 141* 108* 113*    Iron Studies: No results for input(s): IRON, TIBC, TRANSFERRIN, FERRITIN in the last 72 hours. Studies/Results: Dg Chest Port 1 View  Result Date: 08/18/2019 CLINICAL DATA:  Pneumonia EXAM: PORTABLE CHEST 1 VIEW COMPARISON:  Chest radiograph dated 08/06/2019 FINDINGS: A right internal jugular central venous catheter tip overlies the right atrium. The heart remains mildly enlarged. Vascular calcifications are seen in the aortic arch. There are diffuse bilateral interstitial opacities which are not significantly changed. There is no pleural effusion or pneumothorax. IMPRESSION: 1. No significant interval change in diffuse bilateral interstitial opacities which may reflect pulmonary edema or atypical pneumonia. Electronically Signed   By: Zerita Boers M.D.   On: 08/18/2019 10:05   Medications: Infusions: . sodium chloride 10 mL/hr at  08/14/19 1115  . heparin 2,200 Units/hr (08/17/19 2328)  . piperacillin-tazobactam (ZOSYN)  IV 2.25 g (08/18/19 0559)    Scheduled Medications: . sodium chloride   Intravenous Once  . aspirin EC  81 mg Oral Daily  . atorvastatin  20 mg Oral QHS  . calcitRIOL  0.25 mcg Oral Q M,W,F-HD  . carvedilol  6.25 mg Oral BID WC  . Chlorhexidine Gluconate Cloth  6 each Topical Q0600  . Chlorhexidine Gluconate Cloth  6 each Topical Q0600  . [START ON 08/19/2019] darbepoetin (ARANESP) injection - DIALYSIS  100 mcg Intravenous Q Sun-HD  . mupirocin ointment   Nasal BID  . pantoprazole  40 mg Oral Daily  . saccharomyces boulardii  250 mg Oral QHS  . vancomycin variable dose per unstable renal function (pharmacist dosing)   Does not apply See admin instructions    have reviewed scheduled and prn medications.  Physical Exam: General: more alert - talking on the phone- pleasant Heart: RRR Lungs: mostly clear Abdomen: non tender  Extremities: some dep  edema Dialysis Access: right sided TDC-  Also with left AVF-  Weak thrill and bruit     08/18/2019,11:10 AM  LOS: 12 days

## 2019-08-18 NOTE — Progress Notes (Signed)
[place] for Heparin  Indication: History of  DVT   Allergies  Allergen Reactions  . Crestor [Rosuvastatin Calcium] Other (See Comments)    Leg pain    Patient Measurements: Height: 5\' 9"  (175.3 cm) Weight: 184 lb 11.9 oz (83.8 kg) IBW/kg (Calculated) : 66.2 Heparin Dosing Weight: 84 kg  Vital Signs: Temp: 97.8 F (36.6 C) (11/21 0409) Temp Source: Oral (11/21 0409) BP: 135/66 (11/21 0409) Pulse Rate: 111 (11/21 0409)  Labs: Recent Labs    08/16/19 0547  08/17/19 0814  08/17/19 2029 08/18/19 0632 08/18/19 1451  HGB 7.9*  --  8.6*  --   --  9.2*  --   HCT 24.8*  --  26.0*  --   --  28.1*  --   PLT 374  --  406*  --   --  425*  --   HEPARINUNFRC 0.17*   < > <0.10*   < > 0.98* 0.49 0.43  CREATININE 4.75*  --  3.11*  --   --  4.08*  --    < > = values in this interval not displayed.    Estimated Creatinine Clearance: 16.1 mL/min (A) (by C-G formula based on SCr of 4.08 mg/dL (H)).   Assessment: 63 yr old female on Eliquis PTA for hx DVT. Patient is s/p vein graft bypass and L great toe amputation on 08/14/19, and then returned to the OR for evacuation later that night.  Pharmacy consulted to dose IV heparin while Eliquis is on hold.  Heparin level therapeutic this evening at 0.43  Goal of Therapy:  Heparin level 0.3-0.7 units/ml Monitor platelets by anticoagulation protocol: Yes   Plan:  Continue heparin infusion at 2200 units/hr Monitor daily heparin level, CBC Monitor for signs/symptoms of bleeding  Thank you Anette Guarneri, PharmD 08/18/2019, 3:45 PM

## 2019-08-18 NOTE — Progress Notes (Addendum)
  Progress Note    08/18/2019 9:47 AM 4 Days Post-Op  Subjective:  No complaints.  afebrile  Vitals:   08/18/19 0007 08/18/19 0409  BP: (!) 120/58 135/66  Pulse:  (!) 111  Resp:  20  Temp: 97.6 F (36.4 C) 97.8 F (36.6 C)  SpO2:  96%    Physical Exam: Cardiac:  regular Lungs:  Non labored Incisions:  Clean and dry Extremities:  Wound vac with good seal left foot; monophasic left DP/PT doppler signals.   CBC    Component Value Date/Time   WBC 22.5 (H) 08/18/2019 0632   RBC 3.02 (L) 08/18/2019 0632   HGB 9.2 (L) 08/18/2019 0632   HGB 14.3 12/14/2016 1139   HCT 28.1 (L) 08/18/2019 0632   HCT 44.2 12/14/2016 1139   PLT 425 (H) 08/18/2019 0632   PLT 429 (H) 12/14/2016 1139   MCV 93.0 08/18/2019 0632   MCV 88.5 12/14/2016 1139   MCH 30.5 08/18/2019 0632   MCHC 32.7 08/18/2019 0632   RDW 17.4 (H) 08/18/2019 0632   RDW 17.6 (H) 12/14/2016 1139   LYMPHSABS 1.2 08/18/2019 0632   LYMPHSABS 1.9 12/14/2016 1139   MONOABS 2.0 (H) 08/18/2019 0632   MONOABS 0.7 12/14/2016 1139   EOSABS 0.2 08/18/2019 0632   EOSABS 0.0 10/17/2017 1430   BASOSABS 0.1 08/18/2019 0632   BASOSABS 0.1 12/14/2016 1139    BMET    Component Value Date/Time   NA 129 (L) 08/18/2019 0632   NA 137 12/14/2016 1139   K 3.9 08/18/2019 0632   K 4.2 12/14/2016 1139   CL 93 (L) 08/18/2019 0632   CO2 19 (L) 08/18/2019 0632   CO2 20 (L) 12/14/2016 1139   GLUCOSE 114 (H) 08/18/2019 0632   GLUCOSE 115 12/14/2016 1139   BUN 24 (H) 08/18/2019 0632   BUN 32.8 (H) 12/14/2016 1139   CREATININE 4.08 (H) 08/18/2019 0632   CREATININE 2.1 (H) 12/14/2016 1139   CALCIUM 7.8 (L) 08/18/2019 0632   CALCIUM 8.3 (L) 11/11/2017 0740   CALCIUM 9.5 12/14/2016 1139   GFRNONAA 11 (L) 08/18/2019 0632   GFRAA 13 (L) 08/18/2019 2878    INR    Component Value Date/Time   INR 1.6 (H) 08/07/2019 0529     Intake/Output Summary (Last 24 hours) at 08/18/2019 0947 Last data filed at 08/17/2019 2326 Gross per 24  hour  Intake 214 ml  Output -  Net 214 ml     Assessment:  63 y.o. female is s/p:  L femoral to AK pop bypass with vein with subsequent hematoma evacuation L groin and amputation of left great to with extensive debridement of skin, subcu tissue, muscle and tendon of left foot 4 Days Post-Op  Plan: -wound vac replaced yesterday.  Will take off and check wound on Monday.  Appreciate WOC help with vac.   -continues to have monophasic doppler signals left DP/Pt -leukocytosis of 22.5k-wound looked clean yesterday upon inspection.     Leontine Locket, PA-C [facility] [phone] 08/18/2019 9:47 AM   I have interviewed and examined patient with PA and agree with assessment and plan above. She is on home lipitor and aspirin 81mg  initiated. Will change wound vac on Monday.  Sundeep Destin C. Donzetta Matters, MD [facility] [place] Office: [phone] Pager: [phone]

## 2019-08-18 NOTE — Progress Notes (Signed)
[facility] for Heparin  Indication: History of  DVT   Allergies  Allergen Reactions  . Crestor [Rosuvastatin Calcium] Other (See Comments)    Leg pain    Patient Measurements: Height: 5\' 9"  (175.3 cm) Weight: 184 lb 11.9 oz (83.8 kg) IBW/kg (Calculated) : 66.2 Heparin Dosing Weight: 84 kg  Vital Signs: Temp: 97.8 F (36.6 C) (11/21 0409) Temp Source: Oral (11/21 0409) BP: 135/66 (11/21 0409) Pulse Rate: 111 (11/21 0409)  Labs: Recent Labs    08/16/19 0547  08/17/19 0814 08/17/19 1839 08/17/19 2029 08/18/19 0632  HGB 7.9*  --  8.6*  --   --  9.2*  HCT 24.8*  --  26.0*  --   --  28.1*  PLT 374  --  406*  --   --  425*  HEPARINUNFRC 0.17*   < > <0.10* >2.20* 0.98* 0.49  CREATININE 4.75*  --  3.11*  --   --  4.08*   < > = values in this interval not displayed.    Estimated Creatinine Clearance: 16.1 mL/min (A) (by C-G formula based on SCr of 4.08 mg/dL (H)).   Assessment: 63 yr old female on Eliquis PTA for hx DVT. Patient is s/p vein graft bypass and L great toe amputation on 08/14/19, and then returned to the OR for evacuation later that night.  Pharmacy consulted to dose IV heparin while Eliquis is on hold.  9 hour Heparin level was therapeutic at 0.49. No bleeding issues or problems with infusion noted by RN. Hg/Hct are low but stable  At 9.2/28.1. Plts are high but stable at 425.   Goal of Therapy:  Heparin level 0.3-0.7 units/ml Monitor platelets by anticoagulation protocol: Yes   Plan:  Continue heparin infusion at 2200 units/hr Check 8 hr confirmatory heparin level Monitor daily heparin level, CBC Monitor for signs/symptoms of bleeding  Sherren Kerns, PharmD PGY1 Acute Care Pharmacy Resident 08/18/2019, 7:56 AM

## 2019-08-18 NOTE — Progress Notes (Signed)
Hospitalist progress note  If 7PM-7AM,  night-coverage-look on AMION -prefer pages-not epic diem, dicocco [phone] DOB: 1956/05/28 DOA: 08/06/2019  PCP: Martin[place], Sarah T, MD   Narrative:  63 year old Known peripheral vascular disease with multiple episodes of cellulitis and amputations-critical left leg ischemia status post angioplasty left SFA plus stent ESRD since 11/16/2017  TTS [place] ? MGUS Systolic + diastolic heart failure EF 35% Previous bilateral lower extremity DVTs 2019 HTN Prior C. difficile colitis/2019  Admitted with worsening left great toe gangrene Vascular, Ortho, renal consulted  Assessment & Plan: Sepsis secondary to cellulitis osteomyelitis and left great toe gangrene Status post left femoral to AK pop bypass with hematoma evacuation + debridement skin left foot White count 20 range Continue Vanco and Zosyn as per vascular surgery Patient is at high risk for further amputation so based on my discussion with Dr. Trula Slade we will keep on IV heparin and see how the wound demarcates per him Acute blood loss anemia from hematoma evacuation-blood loss 600 cc? Given 1 unit of PRBC--recheck labs a.m.--Rec'd 1 u PRBC 11/18 and again 11/19 1 U prbc Hemoglobin improved to 9 we will check with dialysis Malaise We will get a chest x-ray later on this morning make sure that she does not have volume overload versus pneumonia ESRD TTS Anemia of renal disease Secondary hyperparathyroidism phosphorus 5.1 Defer to renal-low sodium points to may be volume overload Continue PhosLo 3 times daily, Rocaltrol 0.25 MWF Previous bilateral lower extremity DVTs Continues on IV heparin at this time-holding anticoagulation until further surgical plan can be clear vascular surgery-previously was on Brilinta 90 twice daily in addition to apixaban 2.5 twice daily Systolic, diastolic heart failure EF 35% moderate mitral regurg PAP 55 HTN Holding Amlodipine 5 , hydralazine 25 3 times  daily  Adding back a lower dose of Coreg 6.25 twice daily As needed metoprolol for heart rate above 130 with IV Reflux continue pantoprazole 40 daily  Not ready for discharge No family present Remain inpatient, continue IV heparin at this time  Subjective: "I just do not feel well"-did not sleep well last night slept only at around 6 AM has had 3 soft stools not watery since yesterday no fever no chills somewhat tired Overall pain is controlled however VAC is in place Some cough overnight  Consultants:   Renal  Vascular  Ortho  Procedures:   Left femoral to AK popliteal bypass and then subsequent evacuation of hematoma 11/17 Antimicrobials:   Vancomycin and Zosyn Objective: Vitals:   08/17/19 0344 08/17/19 1938 08/18/19 0007 08/18/19 0409  BP: (!) 124/54 (!) 118/53 (!) 120/58 135/66  Pulse: 100 99  (!) 111  Resp: 15 17  20   Temp: 97.8 F (36.6 C) 97.7 F (36.5 C) 97.6 F (36.4 C) 97.8 F (36.6 C)  TempSrc: Oral Oral  Oral  SpO2: 97% 97%  96%  Weight:      Height:        Intake/Output Summary (Last 24 hours) at 08/18/2019 0905 Last data filed at 08/17/2019 2326 Gross per 24 hour  Intake 214 ml  Output -  Net 214 ml   Filed Weights   08/16/19 0410 08/16/19 0800 08/16/19 1212  Weight: 84.9 kg 85.6 kg 83.8 kg    Examination: Awake coherent no distress EOMI NCAT no focal deficit neck soft supple Now on oxygen Chest clinically clear no added sound no rales no rhonchi S1-S2 no murmur rub or gallop Left foot has a wound VAC over area of  surgery does not feel warm Neurologically intact but sleepy   Data Reviewed: I have personally reviewed following labs and imaging studies  Scheduled Meds: . sodium chloride   Intravenous Once  . atorvastatin  20 mg Oral QHS  . calcitRIOL  0.25 mcg Oral Q M,W,F-HD  . Chlorhexidine Gluconate Cloth  6 each Topical Q0600  . Chlorhexidine Gluconate Cloth  6 each Topical Q0600  . [START ON 08/19/2019] darbepoetin (ARANESP)  injection - DIALYSIS  100 mcg Intravenous Q Sun-HD  . mupirocin ointment   Nasal BID  . pantoprazole  40 mg Oral Daily  . saccharomyces boulardii  250 mg Oral QHS  . vancomycin variable dose per unstable renal function (pharmacist dosing)   Does not apply See admin instructions   Continuous Infusions: . sodium chloride 10 mL/hr at 08/14/19 1115  . heparin 2,200 Units/hr (08/17/19 2328)  . piperacillin-tazobactam (ZOSYN)  IV 2.25 g (08/18/19 0559)    Radiology Studies: Reviewed images personally in health database    LOS: 12 days   Time spent: [place], MD [facility]  08/18/2019, 9:05 AM

## 2019-08-19 LAB — CBC
HCT: 18.4 % — ABNORMAL LOW (ref 36.0–46.0)
Hemoglobin: 6.1 g/dL — CL (ref 12.0–15.0)
MCH: 31.1 pg (ref 26.0–34.0)
MCHC: 33.2 g/dL (ref 30.0–36.0)
MCV: 93.9 fL (ref 80.0–100.0)
Platelets: 398 10*3/uL (ref 150–400)
RBC: 1.96 MIL/uL — ABNORMAL LOW (ref 3.87–5.11)
RDW: 17.2 % — ABNORMAL HIGH (ref 11.5–15.5)
WBC: 17.6 10*3/uL — ABNORMAL HIGH (ref 4.0–10.5)
nRBC: 0 % (ref 0.0–0.2)

## 2019-08-19 LAB — RENAL FUNCTION PANEL
Albumin: 2 g/dL — ABNORMAL LOW (ref 3.5–5.0)
Anion gap: 16 — ABNORMAL HIGH (ref 5–15)
BUN: 32 mg/dL — ABNORMAL HIGH (ref 8–23)
CO2: 21 mmol/L — ABNORMAL LOW (ref 22–32)
Calcium: 8.1 mg/dL — ABNORMAL LOW (ref 8.9–10.3)
Chloride: 92 mmol/L — ABNORMAL LOW (ref 98–111)
Creatinine, Ser: 4.69 mg/dL — ABNORMAL HIGH (ref 0.44–1.00)
GFR calc Af Amer: 11 mL/min — ABNORMAL LOW (ref >60)
GFR calc non Af Amer: 9 mL/min — ABNORMAL LOW (ref >60)
Glucose, Bld: 113 mg/dL — ABNORMAL HIGH (ref 70–99)
Phosphorus: 4.8 mg/dL — ABNORMAL HIGH (ref 2.5–4.6)
Potassium: 3.5 mmol/L (ref 3.5–5.1)
Sodium: 129 mmol/L — ABNORMAL LOW (ref 135–145)

## 2019-08-19 LAB — GLUCOSE, CAPILLARY
Glucose-Capillary: 109 mg/dL — ABNORMAL HIGH (ref 70–99)
Glucose-Capillary: 115 mg/dL — ABNORMAL HIGH (ref 70–99)
Glucose-Capillary: 128 mg/dL — ABNORMAL HIGH (ref 70–99)
Glucose-Capillary: 144 mg/dL — ABNORMAL HIGH (ref 70–99)
Glucose-Capillary: 170 mg/dL — ABNORMAL HIGH (ref 70–99)

## 2019-08-19 LAB — HEPARIN LEVEL (UNFRACTIONATED): Heparin Unfractionated: 0.52 IU/mL (ref 0.30–0.70)

## 2019-08-19 MED ORDER — HEPARIN SODIUM (PORCINE) 1000 UNIT/ML IJ SOLN
3.4000 mL | Freq: Once | INTRAMUSCULAR | Status: AC
  Administered 2019-08-19: 3400 [IU] via INTRAVENOUS

## 2019-08-19 MED ORDER — VANCOMYCIN HCL IN DEXTROSE 500-5 MG/100ML-% IV SOLN: 500.0000 mg | INTRAVENOUS | Status: DC

## 2019-08-19 MED ORDER — CALCITRIOL 0.25 MCG PO CAPS
ORAL_CAPSULE | ORAL | Status: AC
  Filled 2019-08-19: qty 1

## 2019-08-19 MED ORDER — VANCOMYCIN HCL IN DEXTROSE 1-5 GM/200ML-% IV SOLN
INTRAVENOUS | Status: AC
  Filled 2019-08-19: qty 200

## 2019-08-19 MED ORDER — VANCOMYCIN HCL IN DEXTROSE 1-5 GM/200ML-% IV SOLN: 1000.0000 mg | INTRAVENOUS | Status: DC

## 2019-08-19 MED ORDER — HEPARIN SODIUM (PORCINE) 1000 UNIT/ML IJ SOLN
INTRAMUSCULAR | Status: AC
  Filled 2019-08-19: qty 4

## 2019-08-19 MED ORDER — DARBEPOETIN ALFA 100 MCG/0.5ML IJ SOSY
PREFILLED_SYRINGE | INTRAMUSCULAR | Status: AC
  Filled 2019-08-19: qty 0.5

## 2019-08-19 MED ORDER — ALBUMIN HUMAN 25 % IV SOLN
INTRAVENOUS | Status: AC
  Filled 2019-08-19: qty 200

## 2019-08-19 MED ORDER — VANCOMYCIN HCL IN DEXTROSE 1-5 GM/200ML-% IV SOLN
1000.0000 mg | INTRAVENOUS | Status: AC
  Administered 2019-08-19: 1000 mg via INTRAVENOUS
  Filled 2019-08-19: qty 200

## 2019-08-19 MED ORDER — ALBUMIN HUMAN 25 % IV SOLN
25.0000 g | INTRAVENOUS | Status: DC | PRN
  Administered 2019-08-19 (×2): 25 g via INTRAVENOUS

## 2019-08-19 NOTE — Progress Notes (Signed)
  Progress Note    08/19/2019 10:19 AM 5 Days Post-Op  Subjective: Patient evaluated on dialysis does not have any complaints today  Vitals:   08/19/19 0930 08/19/19 1000  BP: (!) 102/36 (!) 130/115  Pulse: 82 83  Resp: 15 13  Temp:    SpO2: 100% 100%    Physical Exam: Awake alert oriented Nonlabored respirations Incisions are clean dry intact left lower extremity Wound VAC to suction left foot  CBC    Component Value Date/Time   WBC 17.6 (H) 08/19/2019 0836   RBC 1.96 (L) 08/19/2019 0836   HGB 8.7 (L) 08/19/2019 0907   HGB 14.3 12/14/2016 1139   HCT 26.8 (L) 08/19/2019 0907   HCT 44.2 12/14/2016 1139   PLT 398 08/19/2019 0836   PLT 429 (H) 12/14/2016 1139   MCV 93.9 08/19/2019 0836   MCV 88.5 12/14/2016 1139   MCH 31.1 08/19/2019 0836   MCHC 33.2 08/19/2019 0836   RDW 17.2 (H) 08/19/2019 0836   RDW 17.6 (H) 12/14/2016 1139   LYMPHSABS 1.2 08/18/2019 0632   LYMPHSABS 1.9 12/14/2016 1139   MONOABS 2.0 (H) 08/18/2019 0632   MONOABS 0.7 12/14/2016 1139   EOSABS 0.2 08/18/2019 0632   EOSABS 0.0 10/17/2017 1430   BASOSABS 0.1 08/18/2019 0632   BASOSABS 0.1 12/14/2016 1139    BMET    Component Value Date/Time   NA 129 (L) 08/19/2019 0836   NA 137 12/14/2016 1139   K 3.5 08/19/2019 0836   K 4.2 12/14/2016 1139   CL 92 (L) 08/19/2019 0836   CO2 21 (L) 08/19/2019 0836   CO2 20 (L) 12/14/2016 1139   GLUCOSE 113 (H) 08/19/2019 0836   GLUCOSE 115 12/14/2016 1139   BUN 32 (H) 08/19/2019 0836   BUN 32.8 (H) 12/14/2016 1139   CREATININE 4.69 (H) 08/19/2019 0836   CREATININE 2.1 (H) 12/14/2016 1139   CALCIUM 8.1 (L) 08/19/2019 0836   CALCIUM 8.3 (L) 11/11/2017 0740   CALCIUM 9.5 12/14/2016 1139   GFRNONAA 9 (L) 08/19/2019 0836   GFRAA 11 (L) 08/19/2019 0836    INR    Component Value Date/Time   INR 1.6 (H) 08/07/2019 0529     Intake/Output Summary (Last 24 hours) at 08/19/2019 1019 Last data filed at 08/19/2019 0033 Gross per 24 hour  Intake 623.02  ml  Output -  Net 623.02 ml     Assessment:  63 y.o. female is s/p:  L femoral to AK pop bypass with vein with subsequent hematoma evacuation L groin and amputation of left great to with extensive debridement of skin, subcu tissue, muscle and tendon of left foot 4 Days Post-Op  Plan: -wound vac change tomorrow    Kristen Berry C. Donzetta Matters, MD [facility] Office: [phone] Pager: [phone]  08/19/2019 10:19 AM

## 2019-08-19 NOTE — Progress Notes (Signed)
Pt is refusing her morning labs d/t numerous unsuccessful attempts. Pt has limited options for IV access. The new IV site is infiltrated. Pt is a dialysis patient. Paged provider for notification, awaiting for return page.

## 2019-08-19 NOTE — Progress Notes (Signed)
Subjective:  Seen on HD this AM- hgb reportedly 6.1-  Recheck was 8.7  Objective Vital signs in last 24 hours: Vitals:   08/19/19 0800 08/19/19 0817 08/19/19 0822 08/19/19 0900  BP: 115/61 (!) 124/51 116/66 (!) 105/46  Pulse: 68 70 72 67  Resp: 17 17 16 18   Temp: 97.6 F (36.4 C)     TempSrc: Oral     SpO2:  100% 100% 100%  Weight: 85.4 kg     Height:       Weight change:   Intake/Output Summary (Last 24 hours) at 08/19/2019 0955 Last data filed at 08/19/2019 0033 Gross per 24 hour  Intake 623.02 ml  Output -  Net 623.02 ml    Outpt HD:MWF [facility]  4h 350/800 80kg 2/2.25 bath TDC Heparin 2400 -LUE AVF (poorly matured, not usingUNTIL LOWER EXTREM Infect/ PAD issue stable ) - calc 0.25 tiw   Assessment/ Plan: Pt is a 63 y.o. yo female with ESRD who was admitted on 08/06/2019 with foot gangrene  Assessment/Plan: 1. Foot gangrene-  Had revasc procedure and toe amp on 11/17- had to go back to OR for hematoma-  vanc and zosyn and pain control - is ongoing  2. ESRD - normally MWF via TDC.  Ran Tues and Thurs off schedule this week due to instability after OR-  Holiday schedule this coming week.  MWF will be Sun/Tues/Fri-  Done today- next will be Tuesday  3. Anemia- finally did drop - low dose ESA, inc dose - cont.  No iron due to infection -  Got 2 units 11/17, one unit 11/18  And on 11/19--hgb 8.7  today  4. Secondary hyperparathyroidism- resumed calcitriol - waiting on phoslo  5. HTN/volume- now off dopamine.  Low sodium argues for volume overload - will UF as able   Louis Meckel    Labs: Basic Metabolic Panel: Recent Labs  Lab 08/17/19 0814 08/18/19 0632 08/18/19 2107 08/19/19 0836  NA 130* 129* 127* 129*  K 3.6 3.9 4.1 3.5  CL 93* 93* 94* 92*  CO2 21* 19* 16* 21*  GLUCOSE 112* 114* 128* 113*  BUN 16 24* 29* 32*  CREATININE 3.11* 4.08* 4.36* 4.69*  CALCIUM 7.7* 7.8* 7.7* 8.1*  PHOS 3.9  --  5.2* 4.8*   Liver Function Tests: Recent Labs   Lab 08/17/19 0814 08/18/19 2107 08/19/19 0836  ALBUMIN 2.1* 2.1* 2.0*   No results for input(s): LIPASE, AMYLASE in the last 168 hours. No results for input(s): AMMONIA in the last 168 hours. CBC: Recent Labs  Lab 08/16/19 0547 08/17/19 0814 08/18/19 0632 08/18/19 2107 08/19/19 0836 08/19/19 0907  WBC 21.7* 19.2* 22.5* 19.1* 17.6*  --   NEUTROABS  --  15.3* 18.5*  --   --   --   HGB 7.9* 8.6* 9.2* 9.2* 6.1* 8.7*  HCT 24.8* 26.0* 28.1* 28.0* 18.4* 26.8*  MCV 95.4 91.2 93.0 93.6 93.9  --   PLT 374 406* 425* 392 398  --    Cardiac Enzymes: No results for input(s): CKTOTAL, CKMB, CKMBINDEX, TROPONINI in the last 168 hours. CBG: Recent Labs  Lab 08/18/19 2030 08/18/19 2055 08/19/19 0025 08/19/19 0439 08/19/19 0737  GLUCAP 48* 106* 128* 115* 109*    Iron Studies: No results for input(s): IRON, TIBC, TRANSFERRIN, FERRITIN in the last 72 hours. Studies/Results: Dg Chest Port 1 View  Result Date: 08/18/2019 CLINICAL DATA:  Pneumonia EXAM: PORTABLE CHEST 1 VIEW COMPARISON:  Chest radiograph dated 08/06/2019 FINDINGS: A right internal  jugular central venous catheter tip overlies the right atrium. The heart remains mildly enlarged. Vascular calcifications are seen in the aortic arch. There are diffuse bilateral interstitial opacities which are not significantly changed. There is no pleural effusion or pneumothorax. IMPRESSION: 1. No significant interval change in diffuse bilateral interstitial opacities which may reflect pulmonary edema or atypical pneumonia. Electronically Signed   By: Zerita Boers M.D.   On: 08/18/2019 10:05   Medications: Infusions: . sodium chloride 10 mL/hr at 08/14/19 1115  . albumin human 25 g (08/19/19 0906)  . albumin human    . heparin Stopped (08/19/19 0319)  . piperacillin-tazobactam (ZOSYN)  IV 2.25 g (08/19/19 0454)  . vancomycin    . vancomycin    . [START ON 08/21/2019] vancomycin     Followed by  . [START ON 08/28/2019] vancomycin       Scheduled Medications: . sodium chloride   Intravenous Once  . aspirin EC  81 mg Oral Daily  . atorvastatin  20 mg Oral QHS  . calcitRIOL  0.25 mcg Oral Q M,W,F-HD  . carvedilol  6.25 mg Oral BID WC  . Chlorhexidine Gluconate Cloth  6 each Topical Q0600  . Chlorhexidine Gluconate Cloth  6 each Topical Q0600  . darbepoetin (ARANESP) injection - DIALYSIS  100 mcg Intravenous Q Sun-HD  . mupirocin ointment   Nasal BID  . pantoprazole  40 mg Oral Daily  . saccharomyces boulardii  250 mg Oral QHS  . vancomycin variable dose per unstable renal function (pharmacist dosing)   Does not apply See admin instructions    have reviewed scheduled and prn medications.  Physical Exam: General: more alert - seen on HD  Heart: RRR Lungs: mostly clear Abdomen: non tender  Extremities: some dep  edema Dialysis Access: right sided TDC-  Also with left AVF-  Weak thrill and bruit but patent    08/19/2019,9:55 AM  LOS: 13 days

## 2019-08-19 NOTE — Progress Notes (Signed)
This Probation officer has been informed of critical hgb of 6.1 that was drawn this am in the HD unit. Repeat H&H as ordered. And hgb currently at 8.7 . Nephrologist aware of above.

## 2019-08-19 NOTE — Progress Notes (Signed)
Hospitalist progress note  If 7PM-7AM,  night-coverage-look on AMION -prefer pages-not epic Kristen Berry, Kristen Berry [phone] DOB: 1956/01/13 DOA: 08/06/2019  PCP: [place], Sarah T, MD   Narrative:  63 year old Known peripheral vascular disease with multiple episodes of cellulitis and amputations-critical left leg ischemia status post angioplasty left SFA plus stent ESRD since 11/16/2017  TTS [place] ? MGUS Systolic + diastolic heart failure EF 35% Previous bilateral lower extremity DVTs 2019 HTN Prior C. difficile colitis/2019  Admitted with worsening left great toe gangrene Vascular, Ortho, renal consulted  Assessment & Plan: Sepsis secondary to cellulitis osteomyelitis and left great toe gangrene Status post left femoral to AK pop bypass with hematoma evacuation + debridement skin left foot Continue Vanco and Zosyn as per vascular surgery Wound to be reivewed in am re: further surgical decision making Acute blood loss anemia from hematoma evacuation-blood loss 600 cc? Given 1 unit of PRBC--recheck labs a.m.--Rec'd 1 u PRBC 11/18 and again 11/19 1 U prbc Hemoglobin stable Malaise cxr neg 11/22 to my overview --looks mor elike fluid ESRD TTS Anemia of renal disease Secondary hyperparathyroidism phosphorus 5.1 Defer to renal-low sodium points to may be volume overload Continue PhosLo 3 times daily, Rocaltrol 0.25 MWF Previous bilateral lower extremity DVTs Continues on IV heparin at this time-holding anticoagulation until further surgical plan can be clear vascular surgery-previously Brilinta 90 twice daily in addition to apixaban 2.5 twice daily Systolic, diastolic heart failure EF 35% moderate mitral regurg PAP 55 HTN Holding Amlodipine 5 , hydralazine 25 3 times daily  Adding back a lower dose of Coreg 6.25 twice daily As needed metoprolol for heart rate above 130 with IV Reflux continue pantoprazole 40 daily  Not ready for discharge Remain inpatient, continue IV heparin  at this time  Subjective:  Seen on HD unit No issues-Vascular access continues to be an issue--might need PICC No cp f/sob n/v  Consultants:   Renal  Vascular  Ortho  Procedures:   Left femoral to AK popliteal bypass and then subsequent evacuation of hematoma 11/17 Antimicrobials:   Vancomycin and Zosyn Objective: Vitals:   08/19/19 0900 08/19/19 0930 08/19/19 1000 08/19/19 1030  BP: (!) 105/46 (!) 102/36 (!) 130/115 94/78  Pulse: 67 82 83 97  Resp: 18 15 13 14   Temp:      TempSrc:      SpO2: 100% 100% 100% 100%  Weight:      Height:        Intake/Output Summary (Last 24 hours) at 08/19/2019 1055 Last data filed at 08/19/2019 0033 Gross per 24 hour  Intake 623.02 ml  Output -  Net 623.02 ml   Filed Weights   08/16/19 0800 08/16/19 1212 08/19/19 0800  Weight: 85.6 kg 83.8 kg 85.4 kg    Examination: Awake coherent more awake alert Chest clinically clear to front and sides S1-S2 no murmur rub or gallop Left foot has a wound VAC over area of surgery does not feel warm Neurologically intact moving 4 limbs well-Has Wound vac on L LE   Data Reviewed: I have personally reviewed following labs and imaging studies  Scheduled Meds: . sodium chloride   Intravenous Once  . aspirin EC  81 mg Oral Daily  . atorvastatin  20 mg Oral QHS  . calcitRIOL  0.25 mcg Oral Q M,W,F-HD  . carvedilol  6.25 mg Oral BID WC  . Chlorhexidine Gluconate Cloth  6 each Topical Q0600  . Chlorhexidine Gluconate Cloth  6 each Topical Q0600  . darbepoetin (  ARANESP) injection - DIALYSIS  100 mcg Intravenous Q Sun-HD  . heparin  3.4 mL Intravenous Once  . mupirocin ointment   Nasal BID  . pantoprazole  40 mg Oral Daily  . saccharomyces boulardii  250 mg Oral QHS  . vancomycin variable dose per unstable renal function (pharmacist dosing)   Does not apply See admin instructions   Continuous Infusions: . sodium chloride 10 mL/hr at 08/14/19 1115  . albumin human 25 g (08/19/19 1030)  .  heparin Stopped (08/19/19 0319)  . piperacillin-tazobactam (ZOSYN)  IV 2.25 g (08/19/19 0454)  . vancomycin    . vancomycin    . [START ON 08/21/2019] vancomycin     Followed by  . [START ON 08/28/2019] vancomycin      Radiology Studies: Reviewed images personally in health database    LOS: 13 days   Time spent: Wiley Ford, MD [facility]  08/19/2019, 10:55 AM

## 2019-08-19 NOTE — Procedures (Signed)
Patient was seen on dialysis and the procedure was supervised.  BFR 400  Via TDC BP is  131/115.   Patient appears to be tolerating treatment well  Kristen Berry 08/19/2019

## 2019-08-20 LAB — GLUCOSE, CAPILLARY
Glucose-Capillary: 107 mg/dL — ABNORMAL HIGH (ref 70–99)
Glucose-Capillary: 157 mg/dL — ABNORMAL HIGH (ref 70–99)
Glucose-Capillary: 205 mg/dL — ABNORMAL HIGH (ref 70–99)
Glucose-Capillary: 139 mg/dL — ABNORMAL HIGH (ref 70–99)
Glucose-Capillary: 167 mg/dL — ABNORMAL HIGH (ref 70–99)
Glucose-Capillary: 145 mg/dL — ABNORMAL HIGH (ref 70–99)

## 2019-08-20 LAB — CBC
HCT: 28.7 % — ABNORMAL LOW (ref 36.0–46.0)
Hemoglobin: 8.9 g/dL — ABNORMAL LOW (ref 12.0–15.0)
MCH: 30.2 pg (ref 26.0–34.0)
MCHC: 31 g/dL (ref 30.0–36.0)
MCV: 97.3 fL (ref 80.0–100.0)
Platelets: 325 10*3/uL (ref 150–400)
RBC: 2.95 MIL/uL — ABNORMAL LOW (ref 3.87–5.11)
RDW: 17.7 % — ABNORMAL HIGH (ref 11.5–15.5)
WBC: 14.3 10*3/uL — ABNORMAL HIGH (ref 4.0–10.5)
nRBC: 0 % (ref 0.0–0.2)

## 2019-08-20 LAB — HEMOGLOBIN AND HEMATOCRIT, BLOOD
HCT: 26.8 % — ABNORMAL LOW (ref 36.0–46.0)
Hemoglobin: 8.7 g/dL — ABNORMAL LOW (ref 12.0–15.0)

## 2019-08-20 LAB — HEPARIN LEVEL (UNFRACTIONATED)
Heparin Unfractionated: <0.1 IU/mL — ABNORMAL LOW (ref 0.30–0.70)
Heparin Unfractionated: <0.1 IU/mL — ABNORMAL LOW (ref 0.30–0.70)

## 2019-08-20 MED ORDER — SODIUM CHLORIDE 0.9 % IV SOLN
2.0000 g | INTRAVENOUS | Status: DC
  Filled 2019-08-20: qty 2

## 2019-08-20 MED ORDER — HEPARIN BOLUS VIA INFUSION
4000.0000 [IU] | Freq: Once | INTRAVENOUS | Status: AC
  Administered 2019-08-20: 13:00:00 4000 [IU] via INTRAVENOUS
  Filled 2019-08-20: qty 4000

## 2019-08-20 MED ORDER — APIXABAN 2.5 MG PO TABS
2.5000 mg | ORAL_TABLET | Freq: Two times a day (BID) | ORAL | Status: DC
  Administered 2019-08-20 – 2019-08-29 (×19): 2.5 mg via ORAL
  Filled 2019-08-20 (×20): qty 1

## 2019-08-20 MED ORDER — VANCOMYCIN HCL IN DEXTROSE 750-5 MG/150ML-% IV SOLN
750.0000 mg | INTRAVENOUS | Status: AC
  Administered 2019-08-21: 15:00:00 750 mg via INTRAVENOUS
  Filled 2019-08-20: qty 150

## 2019-08-20 MED ORDER — SODIUM CHLORIDE 0.9 % IV SOLN
2.0000 g | INTRAVENOUS | Status: DC
  Filled 2019-08-20 (×2): qty 2

## 2019-08-20 MED ORDER — VANCOMYCIN HCL IN DEXTROSE 750-5 MG/150ML-% IV SOLN: 750.0000 mg | INTRAVENOUS | Status: DC

## 2019-08-20 NOTE — Progress Notes (Addendum)
CONSULT NOTE - Follow Up Consult  Pharmacy Consult for Vancomycin/Ceftazidime Indication:  dry gangrene/osteo of toe  Patient Measurements: Height: 5\' 9"  (175.3 cm) Weight: 182 lb 12.2 oz (82.9 kg) IBW/kg (Calculated) : 66.2  Vital Signs: Temp: 98.4 F (36.9 C) (11/23 0412) Temp Source: Oral (11/23 0412) BP: 117/67 (11/23 0905) Pulse Rate: 108 (11/23 0905)  Labs: Recent Labs    08/18/19 2336  08/18/19 2107 08/19/19 0836 08/19/19 0907 08/20/19 0300 08/20/19 1109  HGB 9.2*  --  9.2* 6.1* 8.7* 8.9*  --   HCT 28.1*  --  28.0* 18.4* 26.8* 28.7*  --   PLT 425*  --  392 398  --  325  --   HEPARINUNFRC 0.49   < >  --  0.52  --  <0.10* <0.10*  CREATININE 4.08*  --  4.36* 4.69*  --   --   --    < > = values in this interval not displayed.    Estimated Creatinine Clearance: 13.9 mL/min (A) (by C-G formula based on SCr of 4.69 mg/dL (H)).   Assessment: 63 yo female on vancomycin for cellulitis/osteo L great toe with dry gangrene s/p great toe amputation 11/17. Per 11/22 note still has some necrotic tissue in the wound that may need some debridement at some point.  Patient is on HD normally MWF> schedule changed to Tue/Fri this week for Thanksgiving. Due to continuing IV access issues, pharmacy is consulted to dose ceftazidime with each HD.   11/18 vancomycin total of 1.5 g given (2 doses charted) 11/19 HD 11/20 vancomycin level 25, no dose given 11/22 HD > 1 g vanc given after  11/10 vanc>> 11/9 zosyn>>11/23  1/9 BCx - negF 11/9 covid - neg 11/10 MRSA PCR: pos 11/9: COVID negative 11/16 MRSA PCR pos  Goal of Therapy:  Vanco peak >20 post-HD   Plan:  -Continue vancomycin 750 mg IV with each HD  (Tue/Fri this week  -D/C Zosyn (no IV access) and start ceftazidime 2 gm IV with each HD, starting tomorrow, 11/24 - Follow HD schedule and clinical progress  Gillermina Hu, PharmD, BCPS, [facility] Clinical Pharmacist 08/20/19, 17:14 PM

## 2019-08-20 NOTE — Progress Notes (Signed)
Hospitalist progress note  If 7PM-7AM,  night-coverage-look on AMION -prefer pages-not epic earlyne, feeser [phone] DOB: 1956-04-07 DOA: 08/06/2019  PCP: [place], Sarah T, MD   Narrative:  63 year old Known peripheral vascular disease with multiple episodes of cellulitis and amputations-critical left leg ischemia status post angioplasty left SFA plus stent ESRD since 11/16/2017  TTS [place] ? MGUS Systolic + diastolic heart failure EF 35% Previous bilateral lower extremity DVTs 2019 HTN Prior C. difficile colitis/2019  Admitted with worsening left great toe gangrene Vascular, Ortho, renal consulted  Assessment & Plan: Sepsis secondary to cellulitis osteomyelitis and left great toe gangrene Status post left femoral to AK pop bypass with hematoma evacuation + debridement skin left foot Continue Vanco and Zosyn as per vascular surgery Wounds reviewed by myself and surgeon at bedside 11/23 looks relatively clean-further disposition as per surgeons Acute blood loss anemia from hematoma evacuation-blood loss 600 cc? Given 1 unit of PRBC--recheck labs a.m.--Rec'd 1 u PRBC 11/18 and again 11/19 1 U prbc Hemoglobin stable at this time in the 8 range Continuing heparin for now Malaise cxr neg 11/22 to my overview --looks mor elike fluid ESRD TTS Anemia of renal disease Secondary hyperparathyroidism phosphorus 5.1 Defer to renal-low sodium points to may be volume overload Continue PhosLo 3 times daily, Rocaltrol 0.25 MWF Previous bilateral lower extremity DVTs Continues on IV heparin at this time-previously Brilinta 90 twice daily in addition to apixaban 2.5 twice daily-once all procedures are complete we will consider transitioning back to her usual meds Systolic, diastolic heart failure EF 35% moderate mitral regurg PAP 55 HTN Holding Amlodipine 5 , hydralazine 25 3 times daily  Adding back a lower dose of Coreg 6.25 twice daily As needed metoprolol for heart rate above 130  with IV blood pressure is moderately well controlled Reflux continue pantoprazole 40 daily  Not ready for discharge Remain inpatient, continue IV heparin at this time and IV antibiotics  Subjective:  Doing well no nausea no vomiting pain is 7/10 in foot No fever no chills  there is no acute swollen  Consultants:   Renal  Vascular  Ortho  Procedures:   Left femoral to AK popliteal bypass and then subsequent evacuation of hematoma 11/17 Antimicrobials:   Vancomycin and Zosyn Objective: Vitals:   08/19/19 1840 08/19/19 2107 08/20/19 0412 08/20/19 0905  BP: (!) 111/55 (!) 105/31 (!) 113/40 117/67  Pulse: (!) 108 78 93 (!) 108  Resp:  18 18   Temp:  97.8 F (36.6 C) 98.4 F (36.9 C)   TempSrc:  Oral Oral   SpO2:  100% 97%   Weight:      Height:        Intake/Output Summary (Last 24 hours) at 08/20/2019 7353 Last data filed at 08/20/2019 0500 Gross per 24 hour  Intake 537.12 ml  Output 2500 ml  Net -1962.88 ml   Filed Weights   08/16/19 1212 08/19/19 0800 08/19/19 1217  Weight: 83.8 kg 85.4 kg 82.9 kg    Examination:  Coherent pleasant no distress although states some pain Chest is clear Abdomen is soft nontender no rebound no guarding S1-S2 no murmur Wound as per exam based on vascular surgeon examined at bedside Neurologically intact no focal deficit   Data Reviewed: I have personally reviewed following labs and imaging studies  Scheduled Meds: . sodium chloride   Intravenous Once  . aspirin EC  81 mg Oral Daily  . atorvastatin  20 mg Oral QHS  . calcitRIOL  0.25  mcg Oral Q M,W,F-HD  . carvedilol  6.25 mg Oral BID WC  . Chlorhexidine Gluconate Cloth  6 each Topical Q0600  . darbepoetin (ARANESP) injection - DIALYSIS  100 mcg Intravenous Q Sun-HD  . mupirocin ointment   Nasal BID  . pantoprazole  40 mg Oral Daily  . saccharomyces boulardii  250 mg Oral QHS  . [START ON 08/21/2019] vancomycin  500 mg Intravenous Q Tue-HD   Followed by  . [START  ON 08/28/2019] vancomycin  500 mg Intravenous Q Fri-HD  . vancomycin variable dose per unstable renal function (pharmacist dosing)   Does not apply See admin instructions   Continuous Infusions: . sodium chloride 10 mL/hr at 08/14/19 1115  . albumin human 25 g (08/19/19 1030)  . heparin 2,300 Units/hr (08/20/19 0430)  . piperacillin-tazobactam (ZOSYN)  IV 2.25 g (08/20/19 4037)    Radiology Studies: Reviewed images personally in health database    LOS: 14 days   Time spent: [place], MD [facility]  08/20/2019, 9:52 AM

## 2019-08-20 NOTE — Progress Notes (Addendum)
Subjective:  Kristen Berry  Foot discomfort , oimpreoved with meds   Objective Vital signs in last 24 hours: Vitals:   08/19/19 2107 08/20/19 0412 08/20/19 0905 08/20/19 1236  BP: (!) 105/31 (!) 113/40 117/67 (!) 119/40  Pulse: 78 93 (!) 108 100  Resp: 18 18  18   Temp: 97.8 F (36.6 C) 98.4 F (36.9 C)  97.8 F (36.6 C)  TempSrc: Oral Oral  Oral  SpO2: 100% 97%  100%  Weight:      Height:       Weight change:   Physical Exam: General: alert , nad  Heart: RRR, no m, r ,g Lungs:CTA   Abdomen: Obese, soft, non tender / nondistended  Extremities: some dep  edema Dialysis Access:  RIJ TDC-   LUA AVF   Weak thrill and bruit  Outpt HD:MWF [facility]  4h 350/800 80kg 2/2.25 bath TDC Heparin 2400 -LUE AVF (poorly matured, not usingUNTIL LOWER EXTREM Infect/ PAD issue stable ) - calc 0.25 tiw   Problem/Plan: 1. Left Foot Gangrene with PAD - SP 08/14/19  L femoral to AK pop bypass L grt toe amp with subsequent hematoma evacuation L groin-VVS / admit rx   vanc and zosyn and pain control - wound vac   2. ESRD - normally MWF via TDC.  Ran Tues and Thurs off schedule last  week due to instability after OR-  Holiday schedule this coming week.  MWF will be Sun/Tues/Fri-  Done Sunday 11/22- next will be Tuesday  3.HTN/volume- now off dopamine.  Low sodium 129, and will attempt  UF as able 4. Anemia- HGB  did drop - low dose ESA, inc dose - cont. Today 8.9 hgb , No iron due to infection -  Got 2 units 11/17, one unit 11/18  And on 11/19-- 5. Secondary hyperparathyroidism- resumed calcitriol - waiting on phoslo phos 4.8   Kristen Haber, PA-C [facility] Beeper [phone] 08/20/2019,1:26 PM  LOS: 14 days   Pt seen, examined and agree w A/P as above.  Kristen Splinter  MD 08/20/2019, 4:48 PM    Labs: Basic Metabolic Panel: Recent Labs  Lab 08/17/19 0814 08/18/19 0632 08/18/19 2107 08/19/19 0836  NA 130* 129* 127* 129*  K 3.6 3.9 4.1 3.5  CL 93* 93* 94* 92*  CO2  21* 19* 16* 21*  GLUCOSE 112* 114* 128* 113*  BUN 16 24* 29* 32*  CREATININE 3.11* 4.08* 4.36* 4.69*  CALCIUM 7.7* 7.8* 7.7* 8.1*  PHOS 3.9  --  5.2* 4.8*   Liver Function Tests: Recent Labs  Lab 08/17/19 0814 08/18/19 2107 08/19/19 0836  ALBUMIN 2.1* 2.1* 2.0*   No results for input(s): LIPASE, AMYLASE in the last 168 hours. No results for input(s): AMMONIA in the last 168 hours. CBC: Recent Labs  Lab 08/17/19 0814 08/18/19 0632 08/18/19 2107 08/19/19 0836 08/19/19 0907 08/20/19 0300  WBC 19.2* 22.5* 19.1* 17.6*  --  14.3*  NEUTROABS 15.3* 18.5*  --   --   --   --   HGB 8.6* 9.2* 9.2* 6.1* 8.7* 8.9*  HCT 26.0* 28.1* 28.0* 18.4* 26.8* 28.7*  MCV 91.2 93.0 93.6 93.9  --  97.3  PLT 406* 425* 392 398  --  325   Cardiac Enzymes: No results for input(s): CKTOTAL, CKMB, CKMBINDEX, TROPONINI in the last 168 hours. CBG: Recent Labs  Lab 08/19/19 1303 08/19/19 1646 08/19/19 2106 08/20/19 0410 08/20/19 1233  GLUCAP 107* 144* 170* 157* 205*    Studies/Results: No results found. Medications: .  sodium chloride 10 mL/hr at 08/14/19 1115  . albumin human 25 g (08/19/19 1030)  . heparin 2,300 Units/hr (08/20/19 0430)  . piperacillin-tazobactam (ZOSYN)  IV 2.25 g (08/20/19 1316)  . [START ON 08/21/2019] vancomycin     Followed by  . [START ON 08/28/2019] vancomycin     . sodium chloride   Intravenous Once  . aspirin EC  81 mg Oral Daily  . atorvastatin  20 mg Oral QHS  . calcitRIOL  0.25 mcg Oral Q M,W,F-HD  . carvedilol  6.25 mg Oral BID WC  . Chlorhexidine Gluconate Cloth  6 each Topical Q0600  . darbepoetin (ARANESP) injection - DIALYSIS  100 mcg Intravenous Q Sun-HD  . mupirocin ointment   Nasal BID  . pantoprazole  40 mg Oral Daily  . saccharomyces boulardii  250 mg Oral QHS  . vancomycin variable dose per unstable renal function (pharmacist dosing)   Does not apply See admin instructions

## 2019-08-20 NOTE — Consult Note (Signed)
[facility] Nurse wound follow up Patient receiving care in [place].  Surgical PA and Hospitalist present at the time of the dressing change to left foot Wound type: surgical Measurement: 11 cm x 6.4 cm x 1.6 Wound bed: approximately 50% pink, 50% yellow slough.  See photo Drainage (amount, consistency, odor) serous in cannister Periwound: peeling Dressing procedure/placement/frequency: all black foam removed from wound bed.  Periwound protected by 2 barrier rings.  3 small cut pieces of black foam placed into wound bed. Immediate seal obtained. Patient tolerated well and was premedicated. Val Riles, RN, MSN, CWOCN, CNS-BC, pager [phone]

## 2019-08-20 NOTE — Progress Notes (Addendum)
  Progress Note    08/20/2019 10:00 AM 6 Days Post-Op  Subjective:  Tolerated removal of vac well.  No complaints.  afebrile  Vitals:   08/20/19 0412 08/20/19 0905  BP: (!) 113/40 117/67  Pulse: 93 (!) 108  Resp: 18   Temp: 98.4 F (36.9 C)   SpO2: 97%     Physical Exam: Incisions:       CBC    Component Value Date/Time   WBC 14.3 (H) 08/20/2019 0300   RBC 2.95 (L) 08/20/2019 0300   HGB 8.9 (L) 08/20/2019 0300   HGB 14.3 12/14/2016 1139   HCT 28.7 (L) 08/20/2019 0300   HCT 44.2 12/14/2016 1139   PLT 325 08/20/2019 0300   PLT 429 (H) 12/14/2016 1139   MCV 97.3 08/20/2019 0300   MCV 88.5 12/14/2016 1139   MCH 30.2 08/20/2019 0300   MCHC 31.0 08/20/2019 0300   RDW 17.7 (H) 08/20/2019 0300   RDW 17.6 (H) 12/14/2016 1139   LYMPHSABS 1.2 08/18/2019 0632   LYMPHSABS 1.9 12/14/2016 1139   MONOABS 2.0 (H) 08/18/2019 0632   MONOABS 0.7 12/14/2016 1139   EOSABS 0.2 08/18/2019 0632   EOSABS 0.0 10/17/2017 1430   BASOSABS 0.1 08/18/2019 0632   BASOSABS 0.1 12/14/2016 1139    BMET    Component Value Date/Time   NA 129 (L) 08/19/2019 0836   NA 137 12/14/2016 1139   K 3.5 08/19/2019 0836   K 4.2 12/14/2016 1139   CL 92 (L) 08/19/2019 0836   CO2 21 (L) 08/19/2019 0836   CO2 20 (L) 12/14/2016 1139   GLUCOSE 113 (H) 08/19/2019 0836   GLUCOSE 115 12/14/2016 1139   BUN 32 (H) 08/19/2019 0836   BUN 32.8 (H) 12/14/2016 1139   CREATININE 4.69 (H) 08/19/2019 0836   CREATININE 2.1 (H) 12/14/2016 1139   CALCIUM 8.1 (L) 08/19/2019 0836   CALCIUM 8.3 (L) 11/11/2017 0740   CALCIUM 9.5 12/14/2016 1139   GFRNONAA 9 (L) 08/19/2019 0836   GFRAA 11 (L) 08/19/2019 0836    INR    Component Value Date/Time   INR 1.6 (H) 08/07/2019 0529     Intake/Output Summary (Last 24 hours) at 08/20/2019 1000 Last data filed at 08/20/2019 0500 Gross per 24 hour  Intake 537.12 ml  Output 2500 ml  Net -1962.88 ml     Assessment:  63 y.o. female is s/p:  L femoral to AK pop  bypass with vein with subsequent hematoma evacuation L groinand amputation of left great to with extensive debridement of skin, subcu tissue, muscle and tendon of left foot  6 Days Post-Op  Plan: -wound vac removed & changed by WOC and still has some necrotic tissue in the wound that may need some debridement at some point.   -leukocytosis continues to improve.  -continue wound vac change M/W/F.    Leontine Locket, PA-C [facility] [phone] 08/20/2019 10:00 AM  I have independently interviewed and examined patient and agree with PA assessment and plan above. Wound may require further debridement in the future.  Plan for fistulogram tomorrow morning.  [name] C. Donzetta Matters, MD [facility] [place] Office: [phone] Pager: [phone]

## 2019-08-20 NOTE — Progress Notes (Signed)
[place] with Dr. Verlon Au regarding midline order.  Will need to have approval from nephrology to place.  Dr Verlon Au contacted nephrology and stated that patient will be getting antibiotics in hemodialysis as nephrology has decline ML or PICC order.  Carolee Rota, RN

## 2019-08-20 NOTE — Progress Notes (Signed)
[employer] for Heparin  Indication: History of  DVT   Allergies  Allergen Reactions  . Crestor [Rosuvastatin Calcium] Other (See Comments)    Leg pain    Patient Measurements: Height: 5\' 9"  (175.3 cm) Weight: 182 lb 12.2 oz (82.9 kg) IBW/kg (Calculated) : 66.2 Heparin Dosing Weight: 84 kg  Vital Signs: Temp: 98.4 F (36.9 C) (11/23 0412) Temp Source: Oral (11/23 0412) BP: 113/40 (11/23 0412) Pulse Rate: 93 (11/23 0412)  Labs: Recent Labs    08/18/19 0404 08/18/19 1451 08/18/19 2107 08/19/19 0836 08/19/19 0907 08/20/19 0300  HGB 9.2*  --  9.2* 6.1* 8.7* 8.9*  HCT 28.1*  --  28.0* 18.4* 26.8* 28.7*  PLT 425*  --  392 398  --  325  HEPARINUNFRC 0.49 0.43  --  0.52  --  <0.10*  CREATININE 4.08*  --  4.36* 4.69*  --   --     Estimated Creatinine Clearance: 13.9 mL/min (A) (by C-G formula based on SCr of 4.69 mg/dL (H)).   Assessment: 63 yr old female with h/o DVT, Eliquis on hold, for heparin  Goal of Therapy:  Heparin level 0.3-0.7 units/ml Monitor platelets by anticoagulation protocol: Yes   Plan:  Increase Heparin 2300 units/hr Check heparin level in 8 hours.   Phillis Knack, PharmD, BCPS  08/20/2019, 4:24 AM

## 2019-08-20 NOTE — Progress Notes (Signed)
[facility] for Heparin  Indication: History of  DVT   Allergies  Allergen Reactions  . Crestor [Rosuvastatin Calcium] Other (See Comments)    Leg pain    Patient Measurements: Height: 5\' 9"  (175.3 cm) Weight: 182 lb 12.2 oz (82.9 kg) IBW/kg (Calculated) : 66.2 Heparin Dosing Weight: 84 kg  Vital Signs: Temp: 98.4 F (36.9 C) (11/23 0412) Temp Source: Oral (11/23 0412) BP: 117/67 (11/23 0905) Pulse Rate: 108 (11/23 0905)  Labs: Recent Labs    08/18/19 2409  08/18/19 2107 08/19/19 0836 08/19/19 0907 08/20/19 0300 08/20/19 1109  HGB 9.2*  --  9.2* 6.1* 8.7* 8.9*  --   HCT 28.1*  --  28.0* 18.4* 26.8* 28.7*  --   PLT 425*  --  392 398  --  325  --   HEPARINUNFRC 0.49   < >  --  0.52  --  <0.10* <0.10*  CREATININE 4.08*  --  4.36* 4.69*  --   --   --    < > = values in this interval not displayed.    Estimated Creatinine Clearance: 13.9 mL/min (A) (by C-G formula based on SCr of 4.69 mg/dL (H)).   Assessment: 63 yr old female with h/o DVT, Eliquis on hold, started on heparin but patient has very small veins and keeps losing IV access, hence HL is undetectable. Per RN, lost access twice this AM. RN discussing acces with MD. Instructed RN to give bolus whenever she can establish access.   Goal of Therapy:  Heparin level 0.3-0.7 units/ml Monitor platelets by anticoagulation protocol: Yes   Plan:  Given 4000 unit bolus when IV access is established  Continue Heparin 2300 units/hr Check heparin level tonight  Monitor daily HL, CBC, plt Monitor for signs/symptoms of bleeding   Benetta Spar, PharmD, BCPS, BCCP Clinical Pharmacist  Please check AMION for all [place] phone numbers After 10:00 PM, call [place]

## 2019-08-20 NOTE — Progress Notes (Addendum)
[facility] for Apixaban  Indication: History of  DVT   Allergies  Allergen Reactions  . Crestor [Rosuvastatin Calcium] Other (See Comments)    Leg pain    Patient Measurements: Height: 5\' 9"  (175.3 cm) Weight: 182 lb 12.2 oz (82.9 kg) IBW/kg (Calculated) : 66.2 Heparin Dosing Weight: 84 kg  Vital Signs: Temp: 97.8 F (36.6 C) (11/23 1236) Temp Source: Oral (11/23 1236) BP: 119/40 (11/23 1236) Pulse Rate: 100 (11/23 1236)  Labs: Recent Labs    08/18/19 4098  08/18/19 2107 08/19/19 0836 08/19/19 0907 08/20/19 0300 08/20/19 1109  HGB 9.2*  --  9.2* 6.1* 8.7* 8.9*  --   HCT 28.1*  --  28.0* 18.4* 26.8* 28.7*  --   PLT 425*  --  392 398  --  325  --   HEPARINUNFRC 0.49   < >  --  0.52  --  <0.10* <0.10*  CREATININE 4.08*  --  4.36* 4.69*  --   --   --    < > = values in this interval not displayed.    Estimated Creatinine Clearance: 13.9 mL/min (A) (by C-G formula based on SCr of 4.69 mg/dL (H)).   Assessment: 63 yr old female with h/o DVT, Eliquis on hold, started on heparin but patient has very small veins and keeps losing IV access; hence, HL was undetectable earlier today. Per RN, lost access twice this AM. RN discussing acces with MD. Instructed RN to give bolus whenever she can establish access.  Update: due to continuing IV access issues, heparin infusion has been discontinued, and pharmacy has been consulted to dose apixaban; per RN, no bleeding observed  Goal of Therapy:  Prevention of VTE Monitor platelets by anticoagulation protocol: Yes   Plan:  Apixaban 2.5 mg PO BID (resuming home dose) Monitor daily CBC Monitor for signs/symptoms of bleeding   Gillermina Hu, PharmD, BCPS, [name] Clinical Pharmacist

## 2019-08-20 NOTE — Progress Notes (Signed)
Physical Therapy Treatment Patient Details Name: Kristen Berry MRN: [id number] DOB: 1955/12/14 Today's Date: 08/20/2019    History of Present Illness 63 y.o. female with history of ESRD on hemodialysis on Monday Wednesday Friday, peripheral vascular disease status post stenting, history of DVT, anemia presents to the ER after patient has been noticing increasing swelling of the left foot increasing discharge and gangrene of the great toe noticed over the last 2 weeks. Pt underwent revascularization of LLE and L great toe amputation 11/17 and experienced LLE hematoma post-op. Pt returned to OR to evacuate hematoma and repair vein on 11/17.    PT Comments    Pt in bed upon arrival and agreeable to PT. Pt requesting to not do any OOB mobility secondary to fatigue. Pt reports mild pain in LLE. Pt performed LE therex requiring min cuing for correct performance. Pt encouraged to perform exercises on her throughout the day. Pt required mod A to sit EOB. Pt reported dizziness and lightheadedness once sitting. BP 105/85 taken left forearm. Pt tolerated sitting EOB ~5 min. Pt required mod A to return to supine and scoot up in bed. Pt presents with decreased strength, ROM, balance and activity tolerance limiting functional mobility. Pt oriented and cooperative in surgery but appeared confused at times. Pt asked if there was a snake in her bed when she saw the line for her wound vac.  Pt would benefit from continued skilled acute PT to improve deficits and allow for improved functional mobility. SNF remains appropriate following hospital discharge.   Follow Up Recommendations  SNF     Equipment Recommendations  Other (comment)(TBD at next venue of care)    Recommendations for Other Services       Precautions / Restrictions Precautions Precautions: Fall Precaution Comments: R BKA Restrictions Weight Bearing Restrictions: Yes Other Position/Activity Restrictions: LLE heel WB per MD    Mobility  Bed  Mobility Overal bed mobility: Needs Assistance Bed Mobility: Supine to Sit;Sit to Supine     Supine to sit: Mod assist;HOB elevated Sit to supine: Mod assist;HOB elevated   General bed mobility comments: increased time/effort, cuing for technique, assist for LE advancment and trunk control, assist required to get EOB and return to supine  Transfers                 General transfer comment: deferred for safety with maintaining precautions,  pt also declining OOB mobility due to fatigue  Ambulation/Gait                 Stairs             Wheelchair Mobility    Modified Rankin (Stroke Patients Only)       Balance Overall balance assessment: Needs assistance   Sitting balance-Leahy Scale: Fair Sitting balance - Comments: min G for safety, B UE support                                    Cognition Arousal/Alertness: Awake/alert Behavior During Therapy: WFL for tasks assessed/performed Overall Cognitive Status: Impaired/Different from baseline Area of Impairment: Memory;Following commands;Problem solving                     Memory: Decreased short-term memory;Decreased recall of precautions Following Commands: Follows one step commands with increased time;Follows multi-step commands inconsistently     Problem Solving: Slow processing General Comments: oriented and cooperative however some confusion  as she asked if there was a snake in her bed in regards to line from wound vac      Exercises Total Joint Exercises Ankle Circles/Pumps: AROM;Left;10 reps Short Arc Quad: AROM;Both;10 reps Heel Slides: AROM;Both;10 reps Hip ABduction/ADduction: AROM;Both;10 reps Straight Leg Raises: AROM;Both;10 reps Long Arc Quad: AROM;Left;10 reps    General Comments        Pertinent Vitals/Pain Faces Pain Scale: Hurts little more Pain Descriptors / Indicators: Grimacing Pain Intervention(s): Monitored during session;Repositioned;Limited  activity within patient's tolerance    Home Living                      Prior Function            PT Goals (current goals can now be found in the care plan section) Progress towards PT goals: Progressing toward goals    Frequency    Min 2X/week      PT Plan Current plan remains appropriate    Co-evaluation              AM-PAC PT "6 Clicks" Mobility   Outcome Measure  Help needed turning from your back to your side while in a flat bed without using bedrails?: A Lot Help needed moving from lying on your back to sitting on the side of a flat bed without using bedrails?: A Lot Help needed moving to and from a bed to a chair (including a wheelchair)?: Total Help needed standing up from a chair using your arms (e.g., wheelchair or bedside chair)?: Total Help needed to walk in hospital room?: Total Help needed climbing 3-5 steps with a railing? : Total 6 Click Score: 8    End of Session Equipment Utilized During Treatment: Oxygen Activity Tolerance: Patient limited by fatigue Patient left: in bed;with call bell/phone within reach;with nursing/sitter in room Nurse Communication: Mobility status PT Visit Diagnosis: Other abnormalities of gait and mobility (R26.89);Pain Pain - Right/Left: Left Pain - part of body: Ankle and joints of foot     Time: 5747-3403 PT Time Calculation (min) (ACUTE ONLY): 23 min  Charges:  $Therapeutic Exercise: 8-22 mins                     Kristen Berry PT, DPT 4:52 PM,08/20/19    Kristen Berry 08/20/2019, 4:48 PM

## 2019-08-20 NOTE — Care Management Important Message (Signed)
Important Message  Patient Details  Name: Kristen Berry MRN: [id number] Date of Birth: 1956-03-20   Medicare Important Message Given:  Yes     Shelda Altes 08/20/2019, 12:07 PM

## 2019-08-20 NOTE — Progress Notes (Signed)
Spoke with Juliann Pulse RN and notified that patient is not a candidate for either a Midline or a PICC d/t crcl 13.709 and active with nephrology. VU. Fran Lowes, RN VAST

## 2019-08-21 ENCOUNTER — Ambulatory Visit (HOSPITAL_COMMUNITY): Admission: RE | Admit: 2019-08-21 | Payer: Medicare HMO | Source: Home / Self Care | Admitting: Vascular Surgery

## 2019-08-21 ENCOUNTER — Encounter (HOSPITAL_COMMUNITY)
Admission: EM | Disposition: A | Discharge status: Skilled Nursing Facility | Payer: Self-pay | Source: Home / Self Care | Attending: Family Medicine

## 2019-08-21 ENCOUNTER — Encounter (HOSPITAL_COMMUNITY): Payer: Self-pay | Admitting: Vascular Surgery

## 2019-08-21 DIAGNOSIS — T82898A Other specified complication of vascular prosthetic devices, implants and grafts, initial encounter: Secondary | ICD-10-CM

## 2019-08-21 HISTORY — PX: A/V FISTULAGRAM: CATH118298

## 2019-08-21 LAB — CBC
HCT: 28.3 % — ABNORMAL LOW (ref 36.0–46.0)
Hemoglobin: 8.9 g/dL — ABNORMAL LOW (ref 12.0–15.0)
MCH: 30.2 pg (ref 26.0–34.0)
MCHC: 31.4 g/dL (ref 30.0–36.0)
MCV: 95.9 fL (ref 80.0–100.0)
Platelets: 356 10*3/uL (ref 150–400)
RBC: 2.95 MIL/uL — ABNORMAL LOW (ref 3.87–5.11)
RDW: 17.6 % — ABNORMAL HIGH (ref 11.5–15.5)
WBC: 15.8 10*3/uL — ABNORMAL HIGH (ref 4.0–10.5)
nRBC: 0 % (ref 0.0–0.2)

## 2019-08-21 LAB — BASIC METABOLIC PANEL
Anion gap: 14 (ref 5–15)
BUN: 26 mg/dL — ABNORMAL HIGH (ref 8–23)
CO2: 24 mmol/L (ref 22–32)
Calcium: 8.5 mg/dL — ABNORMAL LOW (ref 8.9–10.3)
Chloride: 94 mmol/L — ABNORMAL LOW (ref 98–111)
Creatinine, Ser: 4.06 mg/dL — ABNORMAL HIGH (ref 0.44–1.00)
GFR calc Af Amer: 13 mL/min — ABNORMAL LOW (ref >60)
GFR calc non Af Amer: 11 mL/min — ABNORMAL LOW (ref >60)
Glucose, Bld: 135 mg/dL — ABNORMAL HIGH (ref 70–99)
Potassium: 3.2 mmol/L — ABNORMAL LOW (ref 3.5–5.1)
Sodium: 132 mmol/L — ABNORMAL LOW (ref 135–145)

## 2019-08-21 LAB — GLUCOSE, CAPILLARY
Glucose-Capillary: 115 mg/dL — ABNORMAL HIGH (ref 70–99)
Glucose-Capillary: 118 mg/dL — ABNORMAL HIGH (ref 70–99)
Glucose-Capillary: 122 mg/dL — ABNORMAL HIGH (ref 70–99)
Glucose-Capillary: 127 mg/dL — ABNORMAL HIGH (ref 70–99)
Glucose-Capillary: 165 mg/dL — ABNORMAL HIGH (ref 70–99)
Glucose-Capillary: 184 mg/dL — ABNORMAL HIGH (ref 70–99)

## 2019-08-21 SURGERY — A/V FISTULAGRAM
Anesthesia: LOCAL | Laterality: Left

## 2019-08-21 MED ORDER — HEPARIN (PORCINE) IN NACL 1000-0.9 UT/500ML-% IV SOLN
INTRAVENOUS | Status: AC
  Filled 2019-08-21: qty 500

## 2019-08-21 MED ORDER — LIDOCAINE HCL (PF) 1 % IJ SOLN
INTRAMUSCULAR | Status: AC
  Filled 2019-08-21: qty 30

## 2019-08-21 MED ORDER — IODIXANOL 320 MG/ML IV SOLN
INTRAVENOUS | Status: DC | PRN
  Administered 2019-08-21: 15 mL via INTRAVENOUS

## 2019-08-21 MED ORDER — HEPARIN (PORCINE) IN NACL 1000-0.9 UT/500ML-% IV SOLN
INTRAVENOUS | Status: DC | PRN
  Administered 2019-08-21: 500 mL

## 2019-08-21 MED ORDER — POTASSIUM CHLORIDE CRYS ER 20 MEQ PO TBCR
40.0000 meq | EXTENDED_RELEASE_TABLET | Freq: Every day | ORAL | Status: DC
  Administered 2019-08-21 – 2019-08-26 (×6): 40 meq via ORAL
  Filled 2019-08-21 (×6): qty 2

## 2019-08-21 MED ORDER — LIDOCAINE HCL (PF) 1 % IJ SOLN
INTRAMUSCULAR | Status: DC | PRN
  Administered 2019-08-21: 5 mL

## 2019-08-21 MED ORDER — VANCOMYCIN HCL IN DEXTROSE 750-5 MG/150ML-% IV SOLN
INTRAVENOUS | Status: AC
  Administered 2019-08-21: 750 mg via INTRAVENOUS
  Filled 2019-08-21: qty 150

## 2019-08-21 MED ORDER — HEPARIN SODIUM (PORCINE) 1000 UNIT/ML IJ SOLN
INTRAMUSCULAR | Status: AC
  Filled 2019-08-21: qty 4

## 2019-08-21 SURGICAL SUPPLY — 9 items
BAG SNAP BAND KOVER 36X36 (MISCELLANEOUS) ×2 IMPLANT
COVER DOME SNAP 22 D (MISCELLANEOUS) ×2 IMPLANT
KIT MICROPUNCTURE NIT STIFF (SHEATH) ×2 IMPLANT
PROTECTION STATION PRESSURIZED (MISCELLANEOUS) ×2
SHEATH PROBE COVER 6X72 (BAG) ×4 IMPLANT
STATION PROTECTION PRESSURIZED (MISCELLANEOUS) ×1 IMPLANT
STOPCOCK MORSE 400PSI 3WAY (MISCELLANEOUS) ×2 IMPLANT
TRAY PV CATH (CUSTOM PROCEDURE TRAY) ×2 IMPLANT
TUBING CIL FLEX 10 FLL-RA (TUBING) ×2 IMPLANT

## 2019-08-21 NOTE — Progress Notes (Signed)
Pt has been having trouble remembering she is currently at the hospital.  She repeatedly asks to be taken back to her room at the hospital.  At times when she's being reminded of where she is, she becomes very upset and starts crying.  At some point she even described people were in her room when she was alone, and saw trash on the bed or things on her hands that weren't there.  Will continue to monitor.  Lupita Dawn, RN

## 2019-08-21 NOTE — Op Note (Signed)
    Patient name: Kristen Berry MRN: [id number] DOB: 08-03-1956 Sex: female  08/21/2019 Pre-operative Diagnosis: End-stage renal disease, malfunctioning left arm fistula Post-operative diagnosis:  Same Surgeon:  Eda Paschal. Donzetta Matters, MD Procedure Performed: 1.  Ultrasound-guided cannulation left arm AV fistula 2.  Left upper arm fistulogram  Indications: 63 year old female with end-stage renal disease.  Most recently she has been hospitalized with left femoropopliteal bypass and toe amputations on the left.  She does have a malfunctioning left arm fistula which underwent balloon angioplasty back in August with 3 and 5 mm balloons.  Unfortunately fistula remains malfunctioning she is indicated for repeat fistulogram.  Findings: There remains a tight stenosis at the arteriovenous anastomosis that is approximately 80%.  This has failed balloon dilatation in the past.  I elected not to intervene at this time and she will need open revision in the future likely after her left foot wound declares itself.   Procedure:  The patient was identified in the holding area and taken to room 8.  The patient was then placed supine on the table and prepped and draped in the usual sterile fashion.  A time out was called.  Ultrasound was used to evaluate the left arm AV fistula.  This was cannulated micropuncture needle followed by wire and sheath.  And images saved the permanent record.  Fistulogram was performed including retrograde images.  With the above findings we will plan open revision of the fistula in the future.  Sheath was removed and suture ligated.  She tolerated procedure well without immediate complication.  Contrast: 15 cc   Evart Mcdonnell C. Donzetta Matters, MD [facility] Office: [phone] Pager: [phone]

## 2019-08-21 NOTE — Progress Notes (Signed)
CONSULT NOTE - Follow Up Consult  Pharmacy Consult for Vancomycin/Ceftazidime Indication:  dry gangrene/osteo of toe s/p amputation 11/16 with remaining necrotic tissue   Patient Measurements: Height: 5\' 9"  (175.3 cm) Weight: 182 lb 12.2 oz (82.9 kg) IBW/kg (Calculated) : 66.2  Vital Signs: Temp: 97.7 F (36.5 C) (11/24 0505) Temp Source: Oral (11/24 0505) BP: 197/81 (11/24 0754) Pulse Rate: 114 (11/24 0754)  Labs: Recent Labs    08/18/19 2107 08/19/19 0836 08/19/19 0907 08/20/19 0300 08/20/19 1109 08/21/19 0309 08/21/19 0443  HGB 9.2* 6.1* 8.7* 8.9*  --  8.9*  --   HCT 28.0* 18.4* 26.8* 28.7*  --  28.3*  --   PLT 392 398  --  325  --  356  --   HEPARINUNFRC  --  0.52  --  <0.10* <0.10*  --   --   CREATININE 4.36* 4.69*  --   --   --   --  4.06*    Estimated Creatinine Clearance: 16.1 mL/min (A) (by C-G formula based on SCr of 4.06 mg/dL (H)).   Assessment: 63 yo female on vancomycin for cellulitis/osteo L great toe with dry gangrene s/p great toe amputation 11/17. Per 11/22 note still has some necrotic tissue in the wound that may need some debridement at some point.  Patient is on HD normally MWF> schedule changed to Tue/Fri this week for Thanksgiving. Due to continuing IV access issues, piptazo was changed to ceftazidime with HD.   11/18 vancomycin total of 1.5 g given (2 doses charted) 11/19 HD 11/20 vancomycin level 25, no dose given 11/22 HD > 1 g vanc given after  11/23 ceftazidime >>  11/10 vanc >> 11/9 zosyn>>11/23  1/9 BCx - negF 11/9 covid - neg 11/10 MRSA PCR: pos 11/9: COVID negative 11/16 MRSA PCR pos  Goal of Therapy:  Vanco peak >20 post-HD   Plan:  -Continue vancomycin 750 mg IV with each HD  (Tue/Fri this week) -F/u pre-HD vancomycin level on 11/27  -Continue ceftazidime 2 gm IV with each HD -Follow HD schedule and clinical progress  Benetta Spar, PharmD, BCPS, BCCP Clinical Pharmacist  Please check AMION for all [place] phone  numbers After 10:00 PM, call [place]

## 2019-08-21 NOTE — Progress Notes (Addendum)
Subjective:  In room  For HD today , Noted just back from [facility] LUA  Fistulogram  Dr Donzetta Matters noted needs open revision after Left foot "declares itself " Objective Vital signs in last 24 hours: Vitals:   08/21/19 0754 08/21/19 1020 08/21/19 1118 08/21/19 1130  BP: (!) 197/81 (!) 164/60 (!) 141/77 (!) 148/83  Pulse: (!) 114 (!) 118    Resp: 17     Temp:      TempSrc:      SpO2: 100%     Weight:      Height:       Weight change:   Physical Exam: General: alert , nad  Heart: RRR, no m, r ,g Lungs:CTA   Abdomen: Obese, soft, non tender / nondistended  Extremities: some dep edema Dialysis Access:  RIJ TDC-   LUA AVF  Weak thrill and bruit  Outpt HD:MWF [facility]  4h 350/800 80kg 2/2.25 bath TDC Heparin 2400 -LUE AVF ( needs revision after  Left foot PAD issues "declares itself'' per Dr Donzetta Matters )    calc 0.25 tiw   Problem/Plan: 1. Left Foot Gangrene with PAD - SP 08/14/19  L femoral to AK pop bypass L grt toe amp with subsequent hematoma evacuation L groin-[facility] / admit rx  vanc and zosyn and pain control - wound vac   2. ESRD - normally MWF via TDC. Ran Tues and Thurs off schedule last  week due to instability after OR- Holiday schedule this coming week. MWF will be Sun/Tues/Fri- next HD today 3.HTN/volume- now off dopamine. Low sodium 129 > 132 this am , and will attempt  UF as able 4. Anemia-HGB  did drop - low dose ESA, inc dose - cont. Today 8.9 hgb ,No iron due to infection - Got 2 units 11/17, one unit 11/18 And on 11/19-- 5. Secondary hyperparathyroidism- resumed calcitriol - waitingon phoslo phos 4.8  Ernest Haber, PA-C [facility] Beeper [phone] 08/21/2019,1:05 PM  LOS: 15 days     I was present at this dialysis session, have reviewed the session itself and made  appropriate changes [name] MD [place] pager [phone]   08/21/2019, 3:10 PM    Labs: Basic Metabolic Panel: Recent Labs  Lab 08/17/19 0814   08/18/19 2107 08/19/19 0836 08/21/19 0443  NA 130*   < > 127* 129* 132*  K 3.6   < > 4.1 3.5 3.2*  CL 93*   < > 94* 92* 94*  CO2 21*   < > 16* 21* 24  GLUCOSE 112*   < > 128* 113* 135*  BUN 16   < > 29* 32* 26*  CREATININE 3.11*   < > 4.36* 4.69* 4.06*  CALCIUM 7.7*   < > 7.7* 8.1* 8.5*  PHOS 3.9  --  5.2* 4.8*  --    < > = values in this interval not displayed.   Liver Function Tests: Recent Labs  Lab 08/17/19 0814 08/18/19 2107 08/19/19 0836  ALBUMIN 2.1* 2.1* 2.0*   No results for input(s): LIPASE, AMYLASE in the last 168 hours. No results for input(s): AMMONIA in the last 168 hours. CBC: Recent Labs  Lab 08/17/19 0814 08/18/19 0632 08/18/19 2107 08/19/19 0836 08/19/19 0907 08/20/19 0300 08/21/19 0309  WBC 19.2* 22.5* 19.1* 17.6*  --  14.3* 15.8*  NEUTROABS 15.3* 18.5*  --   --   --   --   --   HGB 8.6* 9.2* 9.2* 6.1* 8.7* 8.9* 8.9*  HCT 26.0*  28.1* 28.0* 18.4* 26.8* 28.7* 28.3*  MCV 91.2 93.0 93.6 93.9  --  97.3 95.9  PLT 406* 425* 392 398  --  325 356   Cardiac Enzymes: No results for input(s): CKTOTAL, CKMB, CKMBINDEX, TROPONINI in the last 168 hours. CBG: Recent Labs  Lab 08/20/19 2108 08/20/19 2329 08/21/19 0501 08/21/19 0818 08/21/19 1204  GLUCAP 167* 145* 127* 118* 122*    Studies/Results: No results found. Medications: . sodium chloride 10 mL/hr at 08/14/19 1115  . albumin human 25 g (08/19/19 1030)  . cefTAZidime (FORTAZ)  IV     Followed by  . [START ON 08/24/2019] cefTAZidime (FORTAZ)  IV    . vancomycin     Followed by  . [START ON 08/28/2019] vancomycin     . sodium chloride   Intravenous Once  . apixaban  2.5 mg Oral BID  . aspirin EC  81 mg Oral Daily  . atorvastatin  20 mg Oral QHS  . calcitRIOL  0.25 mcg Oral Q M,W,F-HD  . carvedilol  6.25 mg Oral BID WC  . Chlorhexidine Gluconate Cloth  6 each Topical Q0600  . darbepoetin (ARANESP) injection - DIALYSIS  100 mcg Intravenous Q Sun-HD  . mupirocin ointment   Nasal BID  .  pantoprazole  40 mg Oral Daily  . potassium chloride  40 mEq Oral Daily  . saccharomyces boulardii  250 mg Oral QHS  . vancomycin variable dose per unstable renal function (pharmacist dosing)   Does not apply See admin instructions

## 2019-08-21 NOTE — Progress Notes (Signed)
Hospitalist progress note  If 7PM-7AM,  night-coverage-look on AMION -prefer pages-not epic genene, kilman [phone] DOB: 1956/01/03 DOA: 08/06/2019  PCP: [place], Sarah T, MD   Narrative:  63 year old Known peripheral vascular disease with multiple episodes of cellulitis and amputations-critical left leg ischemia status post angioplasty left SFA plus stent ESRD since 11/16/2017  TTS [place] ? MGUS Systolic + diastolic heart failure EF 35% Previous bilateral lower extremity DVTs 2019 HTN Prior C. difficile colitis/2019  Admitted with worsening left great toe gangrene Vascular, Ortho, renal consulted  Assessment & Plan: Sepsis secondary to cellulitis osteomyelitis and left great toe gangrene Status post left femoral to AK pop bypass with hematoma evacuation + debridement skin left foot Continue Vanco/Zyvox c dialysis as per vascular surgery Wounds reviewed 11/23 looks relatively clean-further disposition as per vasc re: further surgery Acute blood loss anemia from hematoma evacuation-blood loss 600 cc? Given 1 unit of PRBC--recheck labs a.m.--Rec'd 1 u PRBC 11/18 and again 11/19 1 U prbc Hemoglobin stable at this time in the 8 range Heparin changed to apixaban in anticipation dispo home Malaise cxr neg 11/22 to my overview --looks mor elike fluid ESRD TTS Anemia of renal disease Secondary hyperparathyroidism phosphorus 5.1 fistulogram 11/24 showed not working-may need replacement once infeciton completely Rx Defer to renal-low sodium points to may be volume overload Continue PhosLo 3 times daily, Rocaltrol 0.25 MWF Previous bilateral lower extremity DVTs Holding for now Brilinta 90 twice daily-resumed apixaban 2.5 twice daily Systolic, diastolic heart failure EF 35% moderate mitral regurg PAP 55 HTN Holding Amlodipine 5 , hydralazine 25 3 times daily  Adding back a lower dose of Coreg 6.25 twice daily As needed metoprolol for heart rate above 130 with IV blood pressure  is moderately well controlled Reflux continue pantoprazole 40 daily  Not ready for discharge Remain inpatient, continue IV heparin at this time and IV antibiotics Called son chris Gilbert and updated  Subjective:  Seen on HD no new issue Some confusion last night None now   Consultants:   Renal  Vascular  Ortho  Procedures:   Left femoral to AK popliteal bypass and then subsequent evacuation of hematoma 11/17  Fistulogram 11/23 Antimicrobials:   Vancomycin and Zosyn Objective: Vitals:   08/21/19 1234 08/21/19 1238 08/21/19 1300 08/21/19 1330  BP: (!) 158/65 (!) 172/73 (!) 168/69 (!) 172/64  Pulse: (!) 118 (!) 118 (!) 113 (!) 116  Resp: 15 16    Temp: 98.1 F (36.7 C)     TempSrc: Oral     SpO2: 96%     Weight:      Height:        Intake/Output Summary (Last 24 hours) at 08/21/2019 1420 Last data filed at 08/20/2019 2212 Gross per 24 hour  Intake 370 ml  Output -  Net 370 ml   Filed Weights   08/16/19 1212 08/19/19 0800 08/19/19 1217  Weight: 83.8 kg 85.4 kg 82.9 kg    Examination:   pleasant no distress  Chest is clear Abdomen is soft nontender no rebound no guarding S1-S2 no murmur Wound has vac on it today Neurologically intact no focal deficit   Data Reviewed: I have personally reviewed following labs and imaging studies  Scheduled Meds: . sodium chloride   Intravenous Once  . apixaban  2.5 mg Oral BID  . aspirin EC  81 mg Oral Daily  . atorvastatin  20 mg Oral QHS  . calcitRIOL  0.25 mcg Oral Q M,W,F-HD  . carvedilol  6.25 mg Oral BID WC  . Chlorhexidine Gluconate Cloth  6 each Topical Q0600  . darbepoetin (ARANESP) injection - DIALYSIS  100 mcg Intravenous Q Sun-HD  . mupirocin ointment   Nasal BID  . pantoprazole  40 mg Oral Daily  . potassium chloride  40 mEq Oral Daily  . saccharomyces boulardii  250 mg Oral QHS  . vancomycin variable dose per unstable renal function (pharmacist dosing)   Does not apply See admin instructions    Continuous Infusions: . sodium chloride 10 mL/hr at 08/14/19 1115  . albumin human 25 g (08/19/19 1030)  . cefTAZidime (FORTAZ)  IV     Followed by  . [START ON 08/24/2019] cefTAZidime (FORTAZ)  IV    . vancomycin     Followed by  . [START ON 08/28/2019] vancomycin      Radiology Studies: Reviewed images personally in health database    LOS: 15 days   Time spent: [place], MD [facility]  08/21/2019, 2:20 PM

## 2019-08-21 NOTE — Progress Notes (Signed)
  Progress Note    08/21/2019 7:35 AM Day of Surgery  Subjective:  Confused overnight, better this a.m.  Vitals:   08/20/19 2329 08/21/19 0505  BP: (!) 99/58 (!) 155/67  Pulse: (!) 101 (!) 106  Resp: 14 18  Temp: 97.6 F (36.4 C) 97.7 F (36.5 C)  SpO2: 100% 100%    Physical Exam: Awake and alert Left arm with weak thrill  CBC    Component Value Date/Time   WBC 15.8 (H) 08/21/2019 0309   RBC 2.95 (L) 08/21/2019 0309   HGB 8.9 (L) 08/21/2019 0309   HGB 14.3 12/14/2016 1139   HCT 28.3 (L) 08/21/2019 0309   HCT 44.2 12/14/2016 1139   PLT 356 08/21/2019 0309   PLT 429 (H) 12/14/2016 1139   MCV 95.9 08/21/2019 0309   MCV 88.5 12/14/2016 1139   MCH 30.2 08/21/2019 0309   MCHC 31.4 08/21/2019 0309   RDW 17.6 (H) 08/21/2019 0309   RDW 17.6 (H) 12/14/2016 1139   LYMPHSABS 1.2 08/18/2019 0632   LYMPHSABS 1.9 12/14/2016 1139   MONOABS 2.0 (H) 08/18/2019 0632   MONOABS 0.7 12/14/2016 1139   EOSABS 0.2 08/18/2019 0632   EOSABS 0.0 10/17/2017 1430   BASOSABS 0.1 08/18/2019 0632   BASOSABS 0.1 12/14/2016 1139    BMET    Component Value Date/Time   NA 132 (L) 08/21/2019 0443   NA 137 12/14/2016 1139   K 3.2 (L) 08/21/2019 0443   K 4.2 12/14/2016 1139   CL 94 (L) 08/21/2019 0443   CO2 24 08/21/2019 0443   CO2 20 (L) 12/14/2016 1139   GLUCOSE 135 (H) 08/21/2019 0443   GLUCOSE 115 12/14/2016 1139   BUN 26 (H) 08/21/2019 0443   BUN 32.8 (H) 12/14/2016 1139   CREATININE 4.06 (H) 08/21/2019 0443   CREATININE 2.1 (H) 12/14/2016 1139   CALCIUM 8.5 (L) 08/21/2019 0443   CALCIUM 8.3 (L) 11/11/2017 0740   CALCIUM 9.5 12/14/2016 1139   GFRNONAA 11 (L) 08/21/2019 0443   GFRAA 13 (L) 08/21/2019 0443    INR    Component Value Date/Time   INR 1.6 (H) 08/07/2019 0529     Intake/Output Summary (Last 24 hours) at 08/21/2019 0735 Last data filed at 08/20/2019 2212 Gross per 24 hour  Intake 370 ml  Output -  Net 370 ml     Assessment/plan:  63 y.o. female is s/p  left fem-ak pop bypass with vein. Needs fistulogram for non functional fistula. Likely will need open revision of avf at some point.     Kristen Berry C. Donzetta Matters, MD [facility] [place] [facility] Office: [phone] Pager: [phone]  08/21/2019 7:35 AM

## 2019-08-22 LAB — CBC
HCT: 30.5 % — ABNORMAL LOW (ref 36.0–46.0)
Hemoglobin: 9.8 g/dL — ABNORMAL LOW (ref 12.0–15.0)
MCH: 30.7 pg (ref 26.0–34.0)
MCHC: 32.1 g/dL (ref 30.0–36.0)
MCV: 95.6 fL (ref 80.0–100.0)
Platelets: 413 10*3/uL — ABNORMAL HIGH (ref 150–400)
RBC: 3.19 MIL/uL — ABNORMAL LOW (ref 3.87–5.11)
RDW: 18.1 % — ABNORMAL HIGH (ref 11.5–15.5)
WBC: 14.4 10*3/uL — ABNORMAL HIGH (ref 4.0–10.5)
nRBC: 0.2 % (ref 0.0–0.2)

## 2019-08-22 LAB — GLUCOSE, CAPILLARY
Glucose-Capillary: 118 mg/dL — ABNORMAL HIGH (ref 70–99)
Glucose-Capillary: 122 mg/dL — ABNORMAL HIGH (ref 70–99)
Glucose-Capillary: 140 mg/dL — ABNORMAL HIGH (ref 70–99)
Glucose-Capillary: 117 mg/dL — ABNORMAL HIGH (ref 70–99)
Glucose-Capillary: 125 mg/dL — ABNORMAL HIGH (ref 70–99)

## 2019-08-22 MED ORDER — RENA-VITE PO TABS
1.0000 | ORAL_TABLET | Freq: Every day | ORAL | Status: DC
  Administered 2019-08-22 – 2019-08-29 (×8): 1 via ORAL
  Filled 2019-08-22 (×8): qty 1

## 2019-08-22 MED ORDER — NEPRO/CARBSTEADY PO LIQD
237.0000 mL | Freq: Three times a day (TID) | ORAL | Status: DC
  Administered 2019-08-22 – 2019-08-23 (×3): 237 mL via ORAL

## 2019-08-22 MED ORDER — QUETIAPINE FUMARATE 25 MG PO TABS
25.0000 mg | ORAL_TABLET | Freq: Every evening | ORAL | Status: DC | PRN
  Administered 2019-08-22: 25 mg via ORAL
  Filled 2019-08-22: qty 1

## 2019-08-22 MED ORDER — ACETAMINOPHEN 500 MG PO TABS
1000.0000 mg | ORAL_TABLET | Freq: Four times a day (QID) | ORAL | Status: DC | PRN
  Administered 2019-08-23 – 2019-08-28 (×5): 1000 mg via ORAL
  Filled 2019-08-22 (×5): qty 2

## 2019-08-22 MED ORDER — DIPHENHYDRAMINE HCL 25 MG PO CAPS
25.0000 mg | ORAL_CAPSULE | Freq: Once | ORAL | Status: AC
  Administered 2019-08-22: 25 mg via ORAL
  Filled 2019-08-22: qty 1

## 2019-08-22 NOTE — Progress Notes (Signed)
Initial Nutrition Assessment  DOCUMENTATION CODES:   Not applicable  INTERVENTION:   Phosphorus fluctuating. Recommend addition of binder if level continue to trend up.    Nepro Shake po TID, each supplement provides 425 kcal and 19 grams protein  Renal MVI daily   NUTRITION DIAGNOSIS:   Increased nutrient needs related to post-op healing as evidenced by estimated needs.  GOAL:   Patient will meet greater than or equal to 90% of their needs  MONITOR:   PO intake, Supplement acceptance, Weight trends, Labs, I & O's, Skin  REASON FOR ASSESSMENT:   LOS    ASSESSMENT:   Patient with PMH significant for ESRD on HD, PVD s/p stenting, CHF, CAD, HTN, and DM. Presents this admission with L foot cellulitis and osteomyelitis of great toe.   11/17- L fem to AK pop bypass with hematoma evacuation, L great toe amp, wound VAC placement  11/24- L AVF cannulation, L arm fistulogram 11/25- bedside debridement  Pt confused. Unable to provide history PTA. Meal completions charted as 0-75% for her last eight meal. RD to provide supplementation to promote post-op healing.   EDW: 80 kg  Current weight: 82.1 kg (down 3 kg over last three days)  I/O: -1,022 ml since 11/11  Last HD yesterday: 3000 ml net UF   Medications: calcitriol, aranesp, rena-vit, 40 mEq KCl daily Labs: Na 132 (L) K 3.2 (L) CBG 139-205 Phos 4.8 (wdl for HD)  NUTRITION - FOCUSED PHYSICAL EXAM:    Most Recent Value  Orbital Region  No depletion  Upper Arm Region  No depletion  Thoracic and Lumbar Region  Unable to assess  Buccal Region  No depletion  Temple Region  No depletion  Clavicle Bone Region  No depletion  Clavicle and Acromion Bone Region  No depletion  Scapular Bone Region  Unable to assess  Dorsal Hand  No depletion  Patellar Region  No depletion  Anterior Thigh Region  No depletion  Posterior Calf Region  No depletion  Edema (RD Assessment)  Mild  Hair  Reviewed  Eyes  Reviewed  Mouth   Reviewed  Skin  Reviewed  Nails  Reviewed     Diet Order:   Diet Order            Diet renal with fluid restriction Fluid restriction: 1200 mL Fluid; Room service appropriate? Yes; Fluid consistency: Thin  Diet effective now              EDUCATION NEEDS:   Not appropriate for education at this time  Skin:  Skin Assessment: Skin Integrity Issues: Skin Integrity Issues:: Other (Comment), Incisions Incisions: L groin, L leg, L great toe- amp Other: MASD- perineum  Last BM:  11/24  Height:   Ht Readings from Last 1 Encounters:  08/08/19 5\' 9"  (1.753 m)    Weight:   Wt Readings from Last 1 Encounters:  08/21/19 82.1 kg    Ideal Body Weight:  65.9 kg  BMI:  Body mass index is 26.73 kg/m.  Estimated Nutritional Needs:   Kcal:  2000-2200 kcal  Protein:  100-120 grams  Fluid:  1000 ml + UOP   Mariana Single RD, LDN Clinical Nutrition Pager # [phone]

## 2019-08-22 NOTE — Consult Note (Signed)
[place] Nurse wound follow up Pt is followed by the vascular team for assessment and plan of care.  They were in earlier to remove dressing and assess wound appearance.  Vac has been discontinued and bedside nurses are performing moist gauze dressing changes BID.  No further role for WOC nurse at this time. Please re-consult if further assistance is needed.  Thank-you,  Julien Girt MSN, [place], [place], [place], [place]

## 2019-08-22 NOTE — Progress Notes (Signed)
Physical Therapy Treatment Patient Details Name: Kristen Berry MRN: [id number] DOB: 1956/05/23 Today's Date: 08/22/2019    History of Present Illness 63 y.o. female with history of ESRD on hemodialysis on Monday Wednesday Friday, peripheral vascular disease status post stenting, history of DVT, anemia presents to the ER after patient has been noticing increasing swelling of the left foot increasing discharge and gangrene of the great toe noticed over the last 2 weeks. Pt underwent revascularization of LLE and L great toe amputation 11/17 and experienced LLE hematoma post-op. Pt returned to OR to evacuate hematoma and repair vein on 11/17.    PT Comments    Pt tolerated treatment well despite intermittent reports of wooziness sitting at edge of bed. PT requires verbal cueing to maintain heel WB through LLE. Pt also requires significant physical assistance for all OOB activity due to LE weakness. Pt will continue to benefit from acute PT POC to reduce falls risk and caregiver burden.   Follow Up Recommendations  SNF     Equipment Recommendations  (defer to post-acute setting)    Recommendations for Other Services       Precautions / Restrictions Precautions Precautions: Fall Precaution Comments: R BKA Restrictions Weight Bearing Restrictions: Yes LLE Weight Bearing: (Heel WB)    Mobility  Bed Mobility Overal bed mobility: Needs Assistance Bed Mobility: Supine to Sit;Sit to Supine     Supine to sit: Min assist Sit to supine: Min guard      Transfers Overall transfer level: Needs assistance Equipment used: None Transfers: Squat Pivot Transfers     Squat pivot transfers: Max assist;From elevated surface     General transfer comment: pt transfers to/from bed to bedside commode  Ambulation/Gait                 Stairs             Wheelchair Mobility    Modified Rankin (Stroke Patients Only)       Balance Overall balance assessment: Needs  assistance Sitting-balance support: Bilateral upper extremity supported;Feet supported Sitting balance-Leahy Scale: Good Sitting balance - Comments: supervision   Standing balance support: (pt unable to achieve standing this session)                                Cognition Arousal/Alertness: Awake/alert Behavior During Therapy: WFL for tasks assessed/performed Overall Cognitive Status: Impaired/Different from baseline Area of Impairment: Memory;Safety/judgement;Problem solving                     Memory: Decreased recall of precautions;Decreased short-term memory Following Commands: Follows one step commands consistently;Follows multi-step commands inconsistently Safety/Judgement: Decreased awareness of safety;Decreased awareness of deficits   Problem Solving: Slow processing        Exercises      General Comments General comments (skin integrity, edema, etc.): pt tachy up to 125 during session, also reporting intermittent wooziness on BSC during toileting      Pertinent Vitals/Pain Pain Assessment: No/denies pain    Home Living                      Prior Function            PT Goals (current goals can now be found in the care plan section) Acute Rehab PT Goals Patient Stated Goal: To improve mobility Progress towards PT goals: Progressing toward goals    Frequency  Min 2X/week      PT Plan Current plan remains appropriate    Co-evaluation              AM-PAC PT "6 Clicks" Mobility   Outcome Measure  Help needed turning from your back to your side while in a flat bed without using bedrails?: A Little Help needed moving from lying on your back to sitting on the side of a flat bed without using bedrails?: A Little Help needed moving to and from a bed to a chair (including a wheelchair)?: Total Help needed standing up from a chair using your arms (e.g., wheelchair or bedside chair)?: Total Help needed to walk in  hospital room?: Total Help needed climbing 3-5 steps with a railing? : Total 6 Click Score: 10    End of Session Equipment Utilized During Treatment: Gait belt Activity Tolerance: Patient tolerated treatment well Patient left: in bed;with call bell/phone within reach;with bed alarm set Nurse Communication: Mobility status PT Visit Diagnosis: Other abnormalities of gait and mobility (R26.89);Pain     Time: 8329-1916 PT Time Calculation (min) (ACUTE ONLY): 33 min  Charges:  $Therapeutic Activity: 23-37 mins                     Zenaida Niece, PT, DPT Acute Rehabilitation Pager: [phone]    Zenaida Niece 08/22/2019, 4:51 PM

## 2019-08-22 NOTE — Progress Notes (Addendum)
Progress Note    08/22/2019 7:27 AM 1 Day Post-Op  Subjective:  No complaints this morning   Vitals:   08/21/19 1714 08/22/19 0613  BP: (!) 151/67 (!) 146/64  Pulse: (!) 105 (!) 106  Resp:  18  Temp: 97.9 F (36.6 C) 98.1 F (36.7 C)  SpO2: 99% 98%   Physical Exam: Lungs:  Non labored Incisions:  L arm cath site without hematoma; L groin c/d/i ; LLE incisions healing well without drainage or sign of infection; wound vac in place L foot Extremities:  L DP and PT monophasic by doppler Neurologic: A&O  CBC    Component Value Date/Time   WBC 15.8 (H) 08/21/2019 0309   RBC 2.95 (L) 08/21/2019 0309   HGB 8.9 (L) 08/21/2019 0309   HGB 14.3 12/14/2016 1139   HCT 28.3 (L) 08/21/2019 0309   HCT 44.2 12/14/2016 1139   PLT 356 08/21/2019 0309   PLT 429 (H) 12/14/2016 1139   MCV 95.9 08/21/2019 0309   MCV 88.5 12/14/2016 1139   MCH 30.2 08/21/2019 0309   MCHC 31.4 08/21/2019 0309   RDW 17.6 (H) 08/21/2019 0309   RDW 17.6 (H) 12/14/2016 1139   LYMPHSABS 1.2 08/18/2019 0632   LYMPHSABS 1.9 12/14/2016 1139   MONOABS 2.0 (H) 08/18/2019 0632   MONOABS 0.7 12/14/2016 1139   EOSABS 0.2 08/18/2019 0632   EOSABS 0.0 10/17/2017 1430   BASOSABS 0.1 08/18/2019 0632   BASOSABS 0.1 12/14/2016 1139    BMET    Component Value Date/Time   NA 132 (L) 08/21/2019 0443   NA 137 12/14/2016 1139   K 3.2 (L) 08/21/2019 0443   K 4.2 12/14/2016 1139   CL 94 (L) 08/21/2019 0443   CO2 24 08/21/2019 0443   CO2 20 (L) 12/14/2016 1139   GLUCOSE 135 (H) 08/21/2019 0443   GLUCOSE 115 12/14/2016 1139   BUN 26 (H) 08/21/2019 0443   BUN 32.8 (H) 12/14/2016 1139   CREATININE 4.06 (H) 08/21/2019 0443   CREATININE 2.1 (H) 12/14/2016 1139   CALCIUM 8.5 (L) 08/21/2019 0443   CALCIUM 8.3 (L) 11/11/2017 0740   CALCIUM 9.5 12/14/2016 1139   GFRNONAA 11 (L) 08/21/2019 0443   GFRAA 13 (L) 08/21/2019 0443    INR    Component Value Date/Time   INR 1.6 (H) 08/07/2019 0529     Intake/Output  Summary (Last 24 hours) at 08/22/2019 0727 Last data filed at 08/22/2019 0150 Gross per 24 hour  Intake 472 ml  Output 3000 ml  Net -2528 ml     Assessment/Plan:  63 y.o. female is s/p L femoral to AK popliteal bypass, partial foot amp, and fistulogram  1 Day Post-Op   -Perfusing L foot with PT and DP signal by doppler -Vac change with WOC nurse today; may require return to OR for debridement of necrotic tissue -Fistulogram demonstrates recurrent stenosis of anastomosis; she will need open revision when LLE has healed; continue HD via [facility] -Patient at risk for BKA  ADDENDUM: wound vac was removed and the foot was debrided as much as tolerated at the bedside by Dr. Donzetta Matters.  We will discontinue wound vac for now.  We will begin wet to dry dressing changes today.  Dagoberto Ligas, PA-C [facility] [phone] 08/22/2019 7:27 AM  I have independently and examined interviewed patient and agree with PA assessment and plan above.  Upon removing the wound VAC I did identify necrotic tissue extending back towards her heel on the plantar surface  of her foot.  I attempted to debride some of this at the bedside she did not tolerate.  Unfortunately I am unsure if this is going to be able to heal.  We will switch to wet-to-dry dressings and allowed to declare itself prior to her return trip to the operating room.  I discussed high likelihood of below-knee amputation with the patient and her son via telephone.  Will still need revision of her AV fistula at some time but would like to have foot declare itself prior.  Nakenya Theall C. Donzetta Matters, MD [facility] [place] Office: [phone] Pager: [phone]

## 2019-08-22 NOTE — Progress Notes (Signed)
Pt again was having hallucinations tonight.  She believes she is talking to family members inside her room.  At times, the hallucinations make the pt upset and she starts crying, screaming, and her heart rate goes up to the 120s.  Informed the hospitalist and asked if pt could get a medicine to help her relax and/or sleep.  The hospitalist ordered a one time Benadryl 25 mg pill by mouth.  Benadryl has been given.  Will continue to monitor.  Lupita Dawn, RN

## 2019-08-22 NOTE — Progress Notes (Addendum)
Subjective:  Pleasantly  Confused this am , tolerated HD yest  3l uf   Objective Vital signs in last 24 hours: Vitals:   08/21/19 1630 08/21/19 1714 08/22/19 0613 08/22/19 0805  BP: (!) 136/38 (!) 151/67 (!) 146/64 136/88  Pulse: (!) 102 (!) 105 (!) 106 (!) 107  Resp: 16  18   Temp: 97.7 F (36.5 C) 97.9 F (36.6 C) 98.1 F (36.7 C)   TempSrc: Oral Oral Oral   SpO2: 96% 99% 98%   Weight: 82.1 kg     Height:       Weight change:   Physical Exam: General:alert , nad Heart: Tachy Regular, no m, r ,g Lungs:CTA Abdomen:Obese, soft,non tender/ nondistended Extremities: some dep trace pedaledema/ wound vac to Left ft  Dialysis Access:RIJTDC-LUA AVFWeak thrill and bruit  Outpt HD:MWF [facility]  4h 350/800 80kg 2/2.25 bath [facility] Heparin 2400 -LUE AVF ( needs revision after  Left foot PAD issues "declares itself'' per Dr Donzetta Matters )    calc 0.25 tiw  Problem/Plan: 1. Left Foot Gangrene with PAD -SP 08/14/19 L femoral to AK pop bypassL grt toe ampwith subsequent hematoma evacuation L groin-VVS / admit rxvanc and zosyn and pain control -wound vac 2. ESRD - normally MWF via [facility]. Ran Tues and Thurs off schedulelastweek due to instability after OR- Holiday schedule this coming week. MWF will be Sun/Tues/Fri- next fri 11/27 3.HTN/volume-  3 l uf yest  tolerated bp stable,needs ower edw at dc still 2.9 l Up BUT  PER BED WT 4. Anemia-HGBdid drop - aranesp 100 mcg on 11/22  Today 9.8  hgb ,No iron due to infection - Got 2 units 11/17, one unit 11/18 And on 11/19-- 5. Secondary hyperparathyroidism- resumed calcitriol - waitingon phoslophos 4.8 6. AMS - multifactorial, age, acute illness/ infection/ esrd, c/w sundowning, per primary team  Ernest Haber, PA-C [facility] [phone] 08/22/2019,12:38 PM  LOS: 16 days   Pt seen, examined and agree w A/P as above.  [name]  MD 08/22/2019, 5:10 PM    Labs: Basic  Metabolic Panel: Recent Labs  Lab 08/17/19 0814  08/18/19 2107 08/19/19 0836 08/21/19 0443  NA 130*   < > 127* 129* 132*  K 3.6   < > 4.1 3.5 3.2*  CL 93*   < > 94* 92* 94*  CO2 21*   < > 16* 21* 24  GLUCOSE 112*   < > 128* 113* 135*  BUN 16   < > 29* 32* 26*  CREATININE 3.11*   < > 4.36* 4.69* 4.06*  CALCIUM 7.7*   < > 7.7* 8.1* 8.5*  PHOS 3.9  --  5.2* 4.8*  --    < > = values in this interval not displayed.   Liver Function Tests: Recent Labs  Lab 08/17/19 0814 08/18/19 2107 08/19/19 0836  ALBUMIN 2.1* 2.1* 2.0*   No results for input(s): LIPASE, AMYLASE in the last 168 hours. No results for input(s): AMMONIA in the last 168 hours. CBC: Recent Labs  Lab 08/17/19 0814 08/18/19 0632 08/18/19 2107 08/19/19 0836  08/20/19 0300 08/21/19 0309 08/22/19 0956  WBC 19.2* 22.5* 19.1* 17.6*  --  14.3* 15.8* 14.4*  NEUTROABS 15.3* 18.5*  --   --   --   --   --   --   HGB 8.6* 9.2* 9.2* 6.1*   < > 8.9* 8.9* 9.8*  HCT 26.0* 28.1* 28.0* 18.4*   < > 28.7* 28.3* 30.5*  MCV 91.2 93.0 93.6  93.9  --  97.3 95.9 95.6  PLT 406* 425* 392 398  --  325 356 413*   < > = values in this interval not displayed.   Cardiac Enzymes: No results for input(s): CKTOTAL, CKMB, CKMBINDEX, TROPONINI in the last 168 hours. CBG: Recent Labs  Lab 08/21/19 1710 08/21/19 2033 08/21/19 2355 08/22/19 0627 08/22/19 0753  GLUCAP 115* 165* 184* 118* 122*    Studies/Results: No results found. Medications: . sodium chloride 10 mL/hr at 08/14/19 1115  . albumin human 25 g (08/19/19 1030)  . cefTAZidime (FORTAZ)  IV     Followed by  . [START ON 08/24/2019] cefTAZidime (FORTAZ)  IV    . [START ON 08/28/2019] vancomycin     . sodium chloride   Intravenous Once  . apixaban  2.5 mg Oral BID  . aspirin EC  81 mg Oral Daily  . atorvastatin  20 mg Oral QHS  . calcitRIOL  0.25 mcg Oral Q M,W,F-HD  . carvedilol  6.25 mg Oral BID WC  . Chlorhexidine Gluconate Cloth  6 each Topical Q0600  . darbepoetin  (ARANESP) injection - DIALYSIS  100 mcg Intravenous Q Sun-HD  . feeding supplement (NEPRO CARB STEADY)  237 mL Oral TID BM  . multivitamin  1 tablet Oral QHS  . mupirocin ointment   Nasal BID  . pantoprazole  40 mg Oral Daily  . potassium chloride  40 mEq Oral Daily  . saccharomyces boulardii  250 mg Oral QHS  . vancomycin variable dose per unstable renal function (pharmacist dosing)   Does not apply See admin instructions

## 2019-08-22 NOTE — Progress Notes (Signed)
An hour after the Benadryl was given, the pt was still awake and having hallucinations of family being in her room.  She was still having conversations with the hallucinations and at times got so distressed her heart rate rose to 120s.  Paged the hospitalist again, and she ordered Seroquel 25 mg by mouth as needed at bedtime for agitation/hallucinations.  The Seroquel has been given. Will continue to monitor.  Lupita Dawn, RN

## 2019-08-22 NOTE — Progress Notes (Signed)
Assessed patient for PIV, she is limited to right arm for IVs . There is no site found to place a PIV due to multiple starts already. Would recommend accessing [facility] catheter if IV meds are needed.

## 2019-08-22 NOTE — Progress Notes (Signed)
Hospitalist progress note  If 7PM-7AM,  night-coverage-look on AMION -prefer pages-not epic bee, marchiano [phone] DOB: [birth year] DOA: 08/06/2019  PCP: [place], Sarah T, MD   Narrative:  63 year old Known peripheral vascular disease with multiple episodes of cellulitis and amputations-critical left leg ischemia status post angioplasty left SFA plus stent ESRD since 11/16/2017  TTS  ? MGUS Systolic + diastolic heart failure EF 35% Previous bilateral lower extremity DVTs 2019 HTN Prior C. difficile colitis/2019  Admitted with worsening left great toe gangrene Vascular, Ortho, renal consulted  Assessment & Plan: Sepsis secondary to cellulitis osteomyelitis and left great toe gangrene Status post left femoral to AK pop bypass with hematoma evacuation + debridement skin left foot Continue Vanco/Zyvox c dialysis as per vascular surgery Wounds reviewed 11/23 and subsequently reviewed by vascular 11/25 and bedside debridement done White count stable at this time Acute blood loss anemia from hematoma evacuation-blood loss 600 cc? Given 1 unit of PRBC--recheck labs a.m.--Rec'd 1 u PRBC 11/18 ,  11/19 1 U prbc again Heparin changed to apixaban in anticipation dispo home eventually Malaise cxr neg 11/22 to my overview --looks more like fluid ESRD TTS Anemia of renal disease Secondary hyperparathyroidism phosphorus 5.1 fistulogram 11/24 showed not working-may need replacement once infeciton completely Rx-may be as outpatient? Continue PhosLo 3 times daily, Rocaltrol 0.25 MWF Previous bilateral lower extremity DVTs Holding for now Brilinta 90 twice daily-resumed apixaban 2.5 twice daily Systolic, diastolic heart failure EF 35% moderate mitral regurg PAP 55 HTN Holding Amlodipine 5 , hydralazine 25 3 times daily  Adding back a lower dose of Coreg 6.25 twice daily As needed metoprolol for heart rate above 130 with IV blood pressure is moderately well controlled Reflux  continue pantoprazole 40 daily Hallucinations This is likely 2/2 tio sundowning and could also be 2/70m opiates I will increase her Tylenol to 1000 mg dose.  Have asked that lights be turned on and shades lifted and that she remain engaged over next 24 hours to see if this decreases.  Will Can continue Seroquel 25 dails for now for agitation and confusion hs   Not ready for discharge Remain inpatient Son called on 11/24  Subjective: Some confusion continues hs She needed Seroquel She recalls some of the issues overnight going on but has a diffeent account of this and confabulates a little--she thinks she is at home but other oreintation q's are all correct No cp no fever no chills  Consultants:   Renal  Vascular  Ortho  Procedures:   Left femoral to AK popliteal bypass and then subsequent evacuation of hematoma 11/17  Fistulogram 11/23 Antimicrobials:   Vancomycin and Zosyn Objective: Vitals:   08/21/19 1630 08/21/19 1714 08/22/19 0613 08/22/19 0805  BP: (!) 136/38 (!) 151/67 (!) 146/64 136/88  Pulse: (!) 102 (!) 105 (!) 106 (!) 107  Resp: 16  18   Temp: 97.7 F (36.5 C) 97.9 F (36.6 C) 98.1 F (36.7 C)   TempSrc: Oral Oral Oral   SpO2: 96% 99% 98%   Weight: 82.1 kg     Height:        Intake/Output Summary (Last 24 hours) at 08/22/2019 1035 Last data filed at 08/22/2019 0150 Gross per 24 hour  Intake 472 ml  Output 3000 ml  Net -2528 ml   Filed Weights   08/19/19 0800 08/19/19 1217 08/21/19 1630  Weight: 85.4 kg 82.9 kg 82.1 kg    Examination:  Awake coherent at present No distress eomi ncat no focal  deficit s1 s 2 no m/r/g abd soft nt nd no rebound no guard Foot has Wound vac removed and is being dressed with gauze  Data Reviewed: I have personally reviewed following labs and imaging studies  Scheduled Meds: . sodium chloride   Intravenous Once  . apixaban  2.5 mg Oral BID  . aspirin EC  81 mg Oral Daily  . atorvastatin  20 mg Oral QHS  .  calcitRIOL  0.25 mcg Oral Q M,W,F-HD  . carvedilol  6.25 mg Oral BID WC  . Chlorhexidine Gluconate Cloth  6 each Topical Q0600  . darbepoetin (ARANESP) injection - DIALYSIS  100 mcg Intravenous Q Sun-HD  . mupirocin ointment   Nasal BID  . pantoprazole  40 mg Oral Daily  . potassium chloride  40 mEq Oral Daily  . saccharomyces boulardii  250 mg Oral QHS  . vancomycin variable dose per unstable renal function (pharmacist dosing)   Does not apply See admin instructions   Continuous Infusions: . sodium chloride 10 mL/hr at 08/14/19 1115  . albumin human 25 g (08/19/19 1030)  . cefTAZidime (FORTAZ)  IV     Followed by  . [START ON 08/24/2019] cefTAZidime (FORTAZ)  IV    . [START ON 08/28/2019] vancomycin      Radiology Studies: Reviewed images personally in health database    LOS: 16 days   Time spent: [place], MD [facility]  08/22/2019, 10:35 AM

## 2019-08-23 LAB — RENAL FUNCTION PANEL
Albumin: 2.3 g/dL — ABNORMAL LOW (ref 3.5–5.0)
Anion gap: 12 (ref 5–15)
BUN: 24 mg/dL — ABNORMAL HIGH (ref 8–23)
CO2: 25 mmol/L (ref 22–32)
Calcium: 8.6 mg/dL — ABNORMAL LOW (ref 8.9–10.3)
Chloride: 97 mmol/L — ABNORMAL LOW (ref 98–111)
Creatinine, Ser: 3.42 mg/dL — ABNORMAL HIGH (ref 0.44–1.00)
GFR calc Af Amer: 16 mL/min — ABNORMAL LOW (ref >60)
GFR calc non Af Amer: 13 mL/min — ABNORMAL LOW (ref >60)
Glucose, Bld: 122 mg/dL — ABNORMAL HIGH (ref 70–99)
Phosphorus: 3.3 mg/dL (ref 2.5–4.6)
Potassium: 4.2 mmol/L (ref 3.5–5.1)
Sodium: 134 mmol/L — ABNORMAL LOW (ref 135–145)

## 2019-08-23 LAB — GLUCOSE, CAPILLARY
Glucose-Capillary: 119 mg/dL — ABNORMAL HIGH (ref 70–99)
Glucose-Capillary: 122 mg/dL — ABNORMAL HIGH (ref 70–99)
Glucose-Capillary: 123 mg/dL — ABNORMAL HIGH (ref 70–99)
Glucose-Capillary: 150 mg/dL — ABNORMAL HIGH (ref 70–99)
Glucose-Capillary: 151 mg/dL — ABNORMAL HIGH (ref 70–99)
Glucose-Capillary: 160 mg/dL — ABNORMAL HIGH (ref 70–99)

## 2019-08-23 LAB — CBC
HCT: 32.2 % — ABNORMAL LOW (ref 36.0–46.0)
Hemoglobin: 10.1 g/dL — ABNORMAL LOW (ref 12.0–15.0)
MCH: 30.3 pg (ref 26.0–34.0)
MCHC: 31.4 g/dL (ref 30.0–36.0)
MCV: 96.7 fL (ref 80.0–100.0)
Platelets: 394 10*3/uL (ref 150–400)
RBC: 3.33 MIL/uL — ABNORMAL LOW (ref 3.87–5.11)
RDW: 18.6 % — ABNORMAL HIGH (ref 11.5–15.5)
WBC: 12.8 10*3/uL — ABNORMAL HIGH (ref 4.0–10.5)
nRBC: 0 % (ref 0.0–0.2)

## 2019-08-23 MED ORDER — CHLORHEXIDINE GLUCONATE CLOTH 2 % EX PADS
6.0000 | MEDICATED_PAD | Freq: Every day | CUTANEOUS | Status: DC
  Administered 2019-08-24 – 2019-08-26 (×3): 6 via TOPICAL

## 2019-08-23 NOTE — Progress Notes (Signed)
Hospitalist progress note  If 7PM-7AM,  night-coverage-look on AMION -prefer pages-not epic leronda, lewers [phone] DOB: 1956/03/24 DOA: 08/06/2019  PCP: [place], Sarah T, MD   Narrative:  63 year old Known peripheral vascular disease with multiple episodes of cellulitis and amputations-critical left leg ischemia status post angioplasty left SFA plus stent ESRD since 11/16/2017  TTS [place] ? MGUS Systolic + diastolic heart failure EF 35% Previous bilateral lower extremity DVTs 2019 HTN Prior C. difficile colitis/2019  Admitted with worsening left great toe gangrene Vascular, Ortho, renal consulted  Assessment & Plan: Sepsis secondary to cellulitis osteomyelitis and left great toe gangrene Status post left femoral to AK pop bypass with hematoma evacuation + debridement skin left foot Continue Vanco/Zyvox c dialysis as per vascular surgery Wounds reviewed 11/23 and subsequently reviewed by vascular 11/26 Await foot to declare itself-not ready for d/c today Acute blood loss anemia from hematoma evacuation-blood loss 600 cc? Given 1 unit of PRBC--recheck labs a.m.--Rec'd 1 u PRBC 11/18 ,  11/19 1 U prbc again--hemoglobin stable in 10 range now Heparin changed to apixaban if clears for d/c home Malaise cxr neg 11/22 to my overview --looks more like fluid  ESRD TTS Anemia of renal disease Secondary hyperparathyroidism phosphorus 5.1 fistulogram 11/24 showed not working-may need replacement once infection completely Rx-may be as outpatient? Continue PhosLo 3 times daily, Rocaltrol 0.25 MWF Previous bilateral lower extremity DVTs Holding for now Brilinta 90 twice daily--will need this resumed on d/c-resumed apixaban 2.5 twice daily Systolic, diastolic heart failure EF 35% moderate mitral regurg PAP 55 HTN Holding Amlodipine 5 , hydralazine 25 3 times daily  Adding back a lower dose of Coreg 6.25 twice daily-cannot titrate coreg because low normal bp's As needed metoprolol  for heart rate above 130 with IV blood pressure is moderately well controlled Reflux continue pantoprazole 40 daily Hallucinations This is likely 2/2 tio sundowning and could also be 2/71m opiates I will increase her Tylenol to 1000 mg dose.   Her confusion is better and was possibly 2/2 her opiates   Not ready for discharge Remain inpatient Son called on 11/24 and updated  Subjective:   Consultants:   Renal  Vascular  Ortho  Procedures:   Left femoral to AK popliteal bypass and then subsequent evacuation of hematoma 11/17  Fistulogram 11/23 Antimicrobials:   Vancomycin and Zosyn Objective: Vitals:   08/22/19 2022 08/22/19 2150 08/23/19 0414 08/23/19 0853  BP: (!) 94/57 (!) 117/52 (!) 127/51 96/66  Pulse: (!) 107 (!) 107 (!) 105 (!) 112  Resp: 20  18   Temp: 98.7 F (37.1 C)  98.2 F (36.8 C) 98.9 F (37.2 C)  TempSrc: Oral  Oral Axillary  SpO2: 100% 100% 99% 100%  Weight:      Height:        Intake/Output Summary (Last 24 hours) at 08/23/2019 1039 Last data filed at 08/23/2019 1013 Gross per 24 hour  Intake 120 ml  Output 100 ml  Net 20 ml   Filed Weights   08/19/19 0800 08/19/19 1217 08/21/19 1630  Weight: 85.4 kg 82.9 kg 82.1 kg    Examination:  Awake coherent at present--less confused than prior No distress eomi ncat no focal deficit s1 s 2 no m/r/g abd soft nt nd no rebound no guard Foot has Wound vac removed and is being dressed with gauze  Data Reviewed: I have personally reviewed following labs and imaging studies White count is 12 down from 14 and 15 hemoglobin stable in the 10  range Renal function around baseline 3-4 creatinine   Scheduled Meds: . sodium chloride   Intravenous Once  . apixaban  2.5 mg Oral BID  . aspirin EC  81 mg Oral Daily  . atorvastatin  20 mg Oral QHS  . calcitRIOL  0.25 mcg Oral Q M,W,F-HD  . carvedilol  6.25 mg Oral BID WC  . Chlorhexidine Gluconate Cloth  6 each Topical Q0600  . darbepoetin (ARANESP)  injection - DIALYSIS  100 mcg Intravenous Q Sun-HD  . feeding supplement (NEPRO CARB STEADY)  237 mL Oral TID BM  . multivitamin  1 tablet Oral QHS  . mupirocin ointment   Nasal BID  . pantoprazole  40 mg Oral Daily  . potassium chloride  40 mEq Oral Daily  . saccharomyces boulardii  250 mg Oral QHS  . vancomycin variable dose per unstable renal function (pharmacist dosing)   Does not apply See admin instructions   Continuous Infusions: . sodium chloride 10 mL/hr at 08/14/19 1115  . albumin human 25 g (08/19/19 1030)  . cefTAZidime (FORTAZ)  IV     Followed by  . [START ON 08/24/2019] cefTAZidime (FORTAZ)  IV    . [START ON 08/28/2019] vancomycin      Radiology Studies: Reviewed images personally in health database    LOS: 17 days   Time spent: [place], MD [facility]  08/23/2019, 10:39 AM

## 2019-08-23 NOTE — Plan of Care (Signed)
  Problem: Education: Goal: Knowledge of General Education information will improve Description: Including pain rating scale, medication(s)/side effects and non-pharmacologic comfort measures Outcome: Progressing   Problem: Health Behavior/Discharge Planning: Goal: Ability to manage health-related needs will improve Outcome: Progressing   Problem: Clinical Measurements: Goal: Ability to maintain clinical measurements within normal limits will improve Outcome: Progressing Goal: Will remain free from infection Outcome: Progressing Goal: Diagnostic test results will improve Outcome: Progressing Goal: Respiratory complications will improve Outcome: Progressing Goal: Cardiovascular complication will be avoided Outcome: Progressing   Problem: Activity: Goal: Risk for activity intolerance will decrease Outcome: Progressing   Problem: Skin Integrity: Goal: Risk for impaired skin integrity will decrease Outcome: Progressing   Problem: Education: Goal: Knowledge of prescribed regimen will improve Outcome: Progressing   Problem: Activity: Goal: Ability to tolerate increased activity will improve Outcome: Progressing   Problem: Bowel/Gastric: Goal: Gastrointestinal status for postoperative course will improve Outcome: Progressing   Problem: Clinical Measurements: Goal: Postoperative complications will be avoided or minimized Outcome: Progressing Goal: Signs and symptoms of graft occlusion will improve Outcome: Progressing   Problem: Skin Integrity: Goal: Demonstration of wound healing without infection will improve Outcome: Progressing   Problem: Education: Goal: Knowledge of disease and its progression will improve Outcome: Progressing   Problem: Fluid Volume: Goal: Compliance with measures to maintain balanced fluid volume will improve Outcome: Progressing   Problem: Nutritional: Goal: Ability to make healthy dietary choices will improve Outcome: Progressing    Problem: Clinical Measurements: Goal: Complications related to the disease process, condition or treatment will be avoided or minimized Outcome: Progressing

## 2019-08-23 NOTE — Progress Notes (Signed)
[facility] Progress Note   Subjective:   Patient seen in room. Alert and oriented x 3 today. Reports ongoing weakness. Not sure if she is dizzy as she has not tried to move yet. Denies SOB, CP, palpitations, abdominal pain, N/V/D.   Objective Vitals:   08/22/19 2150 08/23/19 0414 08/23/19 0853 08/23/19 1201  BP: (!) 117/52 (!) 127/51 96/66 (!) 154/62  Pulse: (!) 107 (!) 105 (!) 112 (!) 107  Resp:  18  17  Temp:  98.2 F (36.8 C) 98.9 F (37.2 C) 97.8 F (36.6 C)  TempSrc:  Oral Axillary Oral  SpO2: 100% 99% 100% 100%  Weight:      Height:       Physical Exam General: Well developed,  Heart: Slightly tachycardic, regular rhythm. No murmurs, rubs or gallops Lungs: Decreased breath sounds bilaterally. No rales or wheezing auscultated Abdomen: Soft, non-tender, non-distended. +BS Extremities: 1+ edema b/l lower extremities. L foot dressing present Dialysis Access:  RIJ TDC, LUE AVF with +bruit, weak thrill  Additional Objective Labs: Basic Metabolic Panel: Recent Labs  Lab 08/18/19 2107 08/19/19 0836 08/21/19 0443 08/23/19 0246  NA 127* 129* 132* 134*  K 4.1 3.5 3.2* 4.2  CL 94* 92* 94* 97*  CO2 16* 21* 24 25  GLUCOSE 128* 113* 135* 122*  BUN 29* 32* 26* 24*  CREATININE 4.36* 4.69* 4.06* 3.42*  CALCIUM 7.7* 8.1* 8.5* 8.6*  PHOS 5.2* 4.8*  --  3.3   Liver Function Tests: Recent Labs  Lab 08/18/19 2107 08/19/19 0836 08/23/19 0246  ALBUMIN 2.1* 2.0* 2.3*   CBC: Recent Labs  Lab 08/17/19 0814 08/18/19 0632  08/19/19 0836  08/20/19 0300 08/21/19 0309 08/22/19 0956 08/23/19 0246  WBC 19.2* 22.5*   < > 17.6*  --  14.3* 15.8* 14.4* 12.8*  NEUTROABS 15.3* 18.5*  --   --   --   --   --   --   --   HGB 8.6* 9.2*   < > 6.1*   < > 8.9* 8.9* 9.8* 10.1*  HCT 26.0* 28.1*   < > 18.4*   < > 28.7* 28.3* 30.5* 32.2*  MCV 91.2 93.0   < > 93.9  --  97.3 95.9 95.6 96.7  PLT 406* 425*   < > 398  --  325 356 413* 394   < > = values in this interval not  displayed.   Blood Culture    Component Value Date/Time   SDES BLOOD RIGHT HAND 08/06/2019 2144   SPECREQUEST  08/06/2019 2144    BOTTLES DRAWN AEROBIC ONLY Blood Culture results may not be optimal due to an inadequate volume of blood received in culture bottles   CULT  08/06/2019 2144    NO GROWTH 5 DAYS Performed at [facility], [place] [address], [place], [place] [address]    REPTSTATUS 08/11/2019 FINAL 08/06/2019 2144   CBG: Recent Labs  Lab 08/22/19 2018 08/23/19 0006 08/23/19 0419 08/23/19 0826 08/23/19 1200  GLUCAP 125* 160* 119* 123* 122*   Medications: . sodium chloride 10 mL/hr at 08/14/19 1115  . albumin human 25 g (08/19/19 1030)  . cefTAZidime (FORTAZ)  IV     Followed by  . [START ON 08/24/2019] cefTAZidime (FORTAZ)  IV    . [START ON 08/28/2019] vancomycin     . sodium chloride   Intravenous Once  . apixaban  2.5 mg Oral BID  . aspirin EC  81 mg Oral Daily  . atorvastatin  20 mg Oral QHS  . calcitRIOL  0.25 mcg Oral Q M,W,F-HD  . carvedilol  6.25 mg Oral BID WC  . Chlorhexidine Gluconate Cloth  6 each Topical Q0600  . darbepoetin (ARANESP) injection - DIALYSIS  100 mcg Intravenous Q Sun-HD  . feeding supplement (NEPRO CARB STEADY)  237 mL Oral TID BM  . multivitamin  1 tablet Oral QHS  . mupirocin ointment   Nasal BID  . pantoprazole  40 mg Oral Daily  . potassium chloride  40 mEq Oral Daily  . saccharomyces boulardii  250 mg Oral QHS  . vancomycin variable dose per unstable renal function (pharmacist dosing)   Does not apply See admin instructions    Dialysis Orders: MWF [facility]  4h 350/800 80kg 2/2.25 bath TDC Heparin 2400 -LUE AVF (needs revision afterLeft foot PAD issues resolved) calc 0.25 tiw  Assessment/Plan: 1. Left Foot Gangrene with PAD -SP 08/14/19 L femoral to AK pop bypassL grt toe ampwith subsequent hematoma evacuation L groin. On IV antibiotics. Plan for ongoing local wound care with continued risk of  amputation per VVS. 2. ESRD - normally MWF via TDC. Ran Tues and Thurs off schedulelastweek due to instability after OR- Holiday schedule this coming week. MWF will be Sun/Tues/Fri- nextfri 11/27. K+ 4.2.  3.HTN/volume- BP variable. Has been low and home BP meds held. Low dose coreg restarted. Above EDW about 2kg per bed weights. Still with edema on exam. Plan for HD tomorrow with UF goal 3L as BP tolerates.  4. Anemia-HGBdropped requiring 2 units PRBC on 11/17, 1 unit on 11/18 and 1 unit on 11/19. Received aranesp 100 mcg on 11/22. Hgb up to 10.1 today.  5. Secondary hyperparathyroidism- Corrected calcium 10.0. Follow, d/c calcitriol if elevates further. Phos 3.3, binder on hold. 6. AMS - multifactorial, seems to be improved today. Per primary team.   Anice Paganini, PA-C 08/23/2019, 2:20 PM  [facility] Pager: [phone]

## 2019-08-23 NOTE — Progress Notes (Addendum)
[facility]  Subjective  - States her left foot is hurting some.   Objective (!) 127/51 (!) 105 98.2 F (36.8 C) (Oral) 18 99% No intake or output data in the 24 hours ending 08/23/19 0800  Left DP/PT doppler signals  Left groin soft incision healing, dry guaze over incision Lungs non labored breathing Left foot dressing wet to dry Medial foot with good granulation, plantar fat necrosis with exposed tendon. GEN NAD  Assessment/Planning: POD # 2 Left upper arm fistulogram pending future intervention  Cont. HD via [facility]  08/14/19 #1: Left femoral to above-knee popliteal artery bypass graft with ipsilateral translocated nonreversed saphenous vein                         #2: Exposure of left below-knee popliteal artery                         #3: Amputation of left great to an extensive debridement of skin, subtenons tissue, muscle and tendon on the left foot                         #4: Wound VAC placement                         #5: Popliteal artery endarterectomy, left Return to OR same day Procedure:   #1 exploration of recent femoral popliteal bypass                         #2  Repair of bleeding from vein bypass                         #3  Evacuation of hematoma Cont. Wet to dry dressings and allow left foot to declare itself.    Kristen Berry 08/23/2019 8:00 AM --  Laboratory Lab Results: Recent Labs    08/22/19 0956 08/23/19 0246  WBC 14.4* 12.8*  HGB 9.8* 10.1*  HCT 30.5* 32.2*  PLT 413* 394   BMET Recent Labs    08/21/19 0443 08/23/19 0246  NA 132* 134*  K 3.2* 4.2  CL 94* 97*  CO2 24 25  GLUCOSE 135* 122*  BUN 26* 24*  CREATININE 4.06* 3.42*  CALCIUM 8.5* 8.6*    COAG Lab Results  Component Value Date   INR 1.6 (H) 08/07/2019   INR 1.2 06/25/2019   INR 1.1 02/15/2019   No results found for: PTT  I have examined the patient, reviewed and agree with above.  Brisk Doppler flow to her foot.  She does  have good granulation tissue in the distal portion of her open amputation of her great and second toe.  Continues to have exposed tendon and fat necrosis in the arch of her foot extending proximally.  Discussed that with the patient we will continue local care but certainly is at risk for needing higher level of amputation.  Curt Jews, MD 08/23/2019 9:22 AM

## 2019-08-24 DIAGNOSIS — N186 End stage renal disease: Secondary | ICD-10-CM

## 2019-08-24 DIAGNOSIS — Z992 Dependence on renal dialysis: Secondary | ICD-10-CM

## 2019-08-24 LAB — GLUCOSE, CAPILLARY
Glucose-Capillary: 110 mg/dL — ABNORMAL HIGH (ref 70–99)
Glucose-Capillary: 117 mg/dL — ABNORMAL HIGH (ref 70–99)
Glucose-Capillary: 119 mg/dL — ABNORMAL HIGH (ref 70–99)
Glucose-Capillary: 119 mg/dL — ABNORMAL HIGH (ref 70–99)
Glucose-Capillary: 140 mg/dL — ABNORMAL HIGH (ref 70–99)
Glucose-Capillary: 157 mg/dL — ABNORMAL HIGH (ref 70–99)

## 2019-08-24 LAB — RENAL FUNCTION PANEL
Albumin: 2.5 g/dL — ABNORMAL LOW (ref 3.5–5.0)
Anion gap: 15 (ref 5–15)
BUN: 35 mg/dL — ABNORMAL HIGH (ref 8–23)
CO2: 22 mmol/L (ref 22–32)
Calcium: 8.8 mg/dL — ABNORMAL LOW (ref 8.9–10.3)
Chloride: 96 mmol/L — ABNORMAL LOW (ref 98–111)
Creatinine, Ser: 3.97 mg/dL — ABNORMAL HIGH (ref 0.44–1.00)
GFR calc Af Amer: 13 mL/min — ABNORMAL LOW (ref >60)
GFR calc non Af Amer: 11 mL/min — ABNORMAL LOW (ref >60)
Glucose, Bld: 122 mg/dL — ABNORMAL HIGH (ref 70–99)
Phosphorus: 3.5 mg/dL (ref 2.5–4.6)
Potassium: 5 mmol/L (ref 3.5–5.1)
Sodium: 133 mmol/L — ABNORMAL LOW (ref 135–145)

## 2019-08-24 LAB — CBC
HCT: 34 % — ABNORMAL LOW (ref 36.0–46.0)
Hemoglobin: 10.3 g/dL — ABNORMAL LOW (ref 12.0–15.0)
MCH: 30.4 pg (ref 26.0–34.0)
MCHC: 30.3 g/dL (ref 30.0–36.0)
MCV: 100.3 fL — ABNORMAL HIGH (ref 80.0–100.0)
Platelets: 430 10*3/uL — ABNORMAL HIGH (ref 150–400)
RBC: 3.39 MIL/uL — ABNORMAL LOW (ref 3.87–5.11)
RDW: 18.9 % — ABNORMAL HIGH (ref 11.5–15.5)
WBC: 13.7 10*3/uL — ABNORMAL HIGH (ref 4.0–10.5)
nRBC: 0.1 % (ref 0.0–0.2)

## 2019-08-24 LAB — VANCOMYCIN, RANDOM: Vancomycin Rm: 16

## 2019-08-24 MED ORDER — VANCOMYCIN HCL IN DEXTROSE 750-5 MG/150ML-% IV SOLN
750.0000 mg | Freq: Once | INTRAVENOUS | Status: AC
  Filled 2019-08-24: qty 150

## 2019-08-24 MED ORDER — SODIUM CHLORIDE 0.9 % IV SOLN: 2.0000 g | INTRAVENOUS | Status: DC

## 2019-08-24 MED ORDER — HEPARIN SODIUM (PORCINE) 1000 UNIT/ML IJ SOLN
INTRAMUSCULAR | Status: AC
  Filled 2019-08-24: qty 4

## 2019-08-24 MED ORDER — VANCOMYCIN HCL IN DEXTROSE 750-5 MG/150ML-% IV SOLN
INTRAVENOUS | Status: AC
  Filled 2019-08-24: qty 150

## 2019-08-24 MED ORDER — SODIUM CHLORIDE 0.9 % IV SOLN
2.0000 g | INTRAVENOUS | Status: DC
  Administered 2019-08-27: 2 g via INTRAVENOUS
  Filled 2019-08-24: qty 2

## 2019-08-24 MED ORDER — VANCOMYCIN HCL IN DEXTROSE 750-5 MG/150ML-% IV SOLN: 750.0000 mg | INTRAVENOUS | Status: DC

## 2019-08-24 NOTE — Progress Notes (Signed)
Reported to Francee Gentile, RN that pt was only given vancomycin 750 mg IV the last hour of HD per protocol; advised her the other ABT was not given and is to be given on the floor. Dr. Roney Jaffe, Nephrology made aware on the floor.

## 2019-08-24 NOTE — Progress Notes (Addendum)
[facility]  Subjective  - No new complaints   Objective (!) 150/67 79 97.6 F (36.4 C) (Oral) 20 97%  Intake/Output Summary (Last 24 hours) at 08/24/2019 1165 Last data filed at 08/23/2019 1800 Gross per 24 hour  Intake 477 ml  Output 101 ml  Net 376 ml   Left DP/Peroneal doppler signals  Non labored breathing Left foot dressing changed by RN this am.    Assessment/Planning: POD # 3 Left upper arm fistulogram pending future intervention  Cont. HD via [facility]  08/14/19 #1: Left femoral to above-knee popliteal artery bypass graft with ipsilateral translocated nonreversed saphenous vein #2: Exposure of left below-knee popliteal artery #3: Amputation of left great to an extensive debridement of skin, subtenons tissue, muscle and tendon on the left foot #4: Wound VAC placement #5: Popliteal artery endarterectomy, left Return to OR same day Procedure:#1 exploration of recent femoral popliteal bypass #2 Repair of bleeding from vein bypass #3 Evacuation of hematoma Cont. Wet to dry dressings and allow left foot to declare itself.   Roxy Horseman 08/24/2019 7:28 AM --  Laboratory Lab Results: Recent Labs    08/23/19 0246 08/24/19 0547  WBC 12.8* 13.7*  HGB 10.1* 10.3*  HCT 32.2* 34.0*  PLT 394 430*   BMET Recent Labs    08/23/19 0246 08/24/19 0547  NA 134* 133*  K 4.2 5.0  CL 97* 96*  CO2 25 22  GLUCOSE 122* 122*  BUN 24* 35*  CREATININE 3.42* 3.97*  CALCIUM 8.6* 8.8*    COAG Lab Results  Component Value Date   INR 1.6 (H) 08/07/2019   INR 1.2 06/25/2019   INR 1.1 02/15/2019   No results found for: PTT  I have examined the patient, reviewed and agree with above.  Curt Jews, MD 08/24/2019 1:10 PM

## 2019-08-24 NOTE — Progress Notes (Addendum)
[facility] Progress Note   Subjective:   Patient seen in room post- HD. Reports feeling ok. Denies SOB, orthopnea, CP, palpitations, abdominal pain, N/V. Had had ongoing diarrhea and refusing nepro because she thinks this is making it worse.   Received vancomycin with HD but did not give ceftazidime, see pharm notes. RN unable to give antibiotics on floor because patient has lost all IV access. Pharmacy recommends giving ceftazidime with vancomycin during the last 30 minutes of treatment, ok to give with vancomycin. Will discuss with HD RN.   Objective Vitals:   08/24/19 1130 08/24/19 1200 08/24/19 1225 08/24/19 1315  BP: (!) 154/60 (!) 167/71 (!) 157/61 (!) 143/60  Pulse:  (!) 105 (!) 105   Resp: 15 15 16    Temp:   99 F (37.2 C)   TempSrc:   Oral   SpO2:   96% 100%  Weight:   80.7 kg   Height:       Physical Exam General: Well developed, well nourished female. Alert and in NAD Heart: Slightly tachycardic, regular rhythm. No murmurs, rubs or gallops Lungs: Decreased breath sounds bilaterally. No rales or wheezing auscultated Abdomen: Soft, non-tender, non-distended. +BS Extremities: No edema b/l lower extremities. L foot dressing present Dialysis Access:  RIJ TDC, LUE AVF with +bruit, weak thrill  Additional Objective Labs: Basic Metabolic Panel: Recent Labs  Lab 08/19/19 0836 08/21/19 0443 08/23/19 0246 08/24/19 0547  NA 129* 132* 134* 133*  K 3.5 3.2* 4.2 5.0  CL 92* 94* 97* 96*  CO2 21* 24 25 22   GLUCOSE 113* 135* 122* 122*  BUN 32* 26* 24* 35*  CREATININE 4.69* 4.06* 3.42* 3.97*  CALCIUM 8.1* 8.5* 8.6* 8.8*  PHOS 4.8*  --  3.3 3.5   Liver Function Tests: Recent Labs  Lab 08/19/19 0836 08/23/19 0246 08/24/19 0547  ALBUMIN 2.0* 2.3* 2.5*   CBC: Recent Labs  Lab 08/18/19 0632  08/20/19 0300 08/21/19 0309 08/22/19 0956 08/23/19 0246 08/24/19 0547  WBC 22.5*   < > 14.3* 15.8* 14.4* 12.8* 13.7*  NEUTROABS 18.5*  --   --   --   --   --    --   HGB 9.2*   < > 8.9* 8.9* 9.8* 10.1* 10.3*  HCT 28.1*   < > 28.7* 28.3* 30.5* 32.2* 34.0*  MCV 93.0   < > 97.3 95.9 95.6 96.7 100.3*  PLT 425*   < > 325 356 413* 394 430*   < > = values in this interval not displayed.   Blood Culture    Component Value Date/Time   SDES BLOOD RIGHT HAND 08/06/2019 2144   SPECREQUEST  08/06/2019 2144    BOTTLES DRAWN AEROBIC ONLY Blood Culture results may not be optimal due to an inadequate volume of blood received in culture bottles   CULT  08/06/2019 2144    NO GROWTH 5 DAYS Performed at [facility], [place] [address], [place], [place] [address]    REPTSTATUS 08/11/2019 FINAL 08/06/2019 2144    CBG: Recent Labs  Lab 08/23/19 1954 08/24/19 0005 08/24/19 0439 08/24/19 0739 08/24/19 1336  GLUCAP 151* 119* 119* 117* 110*   Medications: . sodium chloride 10 mL/hr at 08/14/19 1115  . albumin human 25 g (08/19/19 1030)  . cefTAZidime (FORTAZ)  IV     Followed by  . cefTAZidime (FORTAZ)  IV    . [START ON 08/27/2019] cefTAZidime (FORTAZ)  IV    . [START ON 08/27/2019] vancomycin     .  sodium chloride   Intravenous Once  . apixaban  2.5 mg Oral BID  . aspirin EC  81 mg Oral Daily  . atorvastatin  20 mg Oral QHS  . calcitRIOL  0.25 mcg Oral Q M,W,F-HD  . carvedilol  6.25 mg Oral BID WC  . Chlorhexidine Gluconate Cloth  6 each Topical Q0600  . Chlorhexidine Gluconate Cloth  6 each Topical Q0600  . darbepoetin (ARANESP) injection - DIALYSIS  100 mcg Intravenous Q Sun-HD  . feeding supplement (NEPRO CARB STEADY)  237 mL Oral TID BM  . multivitamin  1 tablet Oral QHS  . pantoprazole  40 mg Oral Daily  . potassium chloride  40 mEq Oral Daily  . saccharomyces boulardii  250 mg Oral QHS  . vancomycin variable dose per unstable renal function (pharmacist dosing)   Does not apply See admin instructions    Dialysis Orders: MWF [facility]  4h 350/800 80kg 2/2.25 bath TDC Heparin 2400 -LUE AVF (needs revision afterLeft foot PAD  issues resolved) calc 0.25 tiw  Assessment/Plan: 1. Left Foot Gangrene with PAD -SP L femoral to AK pop bypass +L grt toe amp on 07/1719,with subsequent hematoma evacuation L groin. On IV vancomycin and ceftazidime with HD. Missed dose of ceftazidime today. Pharmacy consulted and will give both antibiotics with HD moving forward- ok for vancomycin and ceftazidime to be given at the same time.  Plan for ongoing local wound care with continued risk of amputation per VVS. 2. ESRD - normally MWF via TDC. Previously off schedule due to holiday/ instability, now back on MWF schedule. Next HD 08/27/19.  3.HTN/volume- BP variable. Previously low and BP meds held, now back up and low dose coreg restarted.Above EDW about 0.7 kg per bed weights. Net UF 3L today and edema improved.  4. Anemia-HGBdropped requiring 2 units PRBC on 11/17, 1 unit on 11/18 and 1 unit on 11/19. Receivedaranesp 100 mcg on 11/22. Hgb up to 10.3 today.  5. Secondary hyperparathyroidism- Corrected calcium 10.0. Follow, d/c calcitriol if elevates further. Phos 3.5, binder on hold. 6. AMS - multifactorial, seems to be improving. Per primary team.   Anice Paganini, PA-C 08/24/2019, 1:56 PM  [facility] Pager: [phone]  Pt seen, examined and agree w A/P as above.  [name]  MD 08/24/2019, 2:54 PM

## 2019-08-24 NOTE — Progress Notes (Signed)
Called HD RN to make aware of need to give vancomycin as well as Fortaz while in HD. Patient has no IV access and orders are for antibiotics to be given by HD RN. Pharmacy made aware of need to send vanc to HD. [name] was sent with patient on chart with reminder to give while in HD. HD RN given call back number if any issues/concerns. Payton Emerald, RN

## 2019-08-24 NOTE — Progress Notes (Signed)
CONSULT NOTE - Follow Up Consult  Pharmacy Consult for Vancomycin/Ceftazidime Indication:  dry gangrene/osteo of toe s/p amputation 11/16 with remaining necrotic tissue   Patient Measurements: Height: 5\' 9"  (175.3 cm) Weight: 185 lb 10 oz (84.2 kg) IBW/kg (Calculated) : 66.2  Vital Signs: Temp: 98 F (36.7 C) (11/27 0817) Temp Source: Oral (11/27 0817) BP: 202/77 (11/27 1030) Pulse Rate: 77 (11/27 1030)  Labs: Recent Labs    08/22/19 0956 08/23/19 0246 08/24/19 0547  HGB 9.8* 10.1* 10.3*  HCT 30.5* 32.2* 34.0*  PLT 413* 394 430*  CREATININE  --  3.42* 3.97*    Estimated Creatinine Clearance: 16.6 mL/min (A) (by C-G formula based on SCr of 3.97 mg/dL (H)).   Assessment: 63 yo female on vancomycin for cellulitis/osteo L great toe with dry gangrene s/p great toe amputation 11/17. Per VVS today, she may need more debridement and her WBC is still elevated so want to continue antibiotics for now.   Patient lost all IV access and is a hard stick so piptazo was changed to ceftazidime with HD. Will need to give vanc with HD as well. Bedside RN communicated with HD RN.  Pre-HD vanc level = 16, will continue 750mg  post HD.    11/23 ceftazidime >>  11/10 vanc >> 11/9 zosyn>>11/23  1/9 BCx - negF 11/9 covid - neg 11/10 MRSA PCR: pos 11/9: COVID negative 11/16 MRSA PCR pos  Goal of Therapy:  Vanco level 15- 20 pre-HD   Plan:  -Continue vancomycin 750 mg IV with each HD  -Continue ceftazidime 2 gm IV with each HD -Adjust order if HD schedule changes  -F/u duration of treatment with VVS  Benetta Spar, PharmD, BCPS, BCCP Clinical Pharmacist  Please check AMION for all [facility] phone numbers After 10:00 PM, call [place]

## 2019-08-24 NOTE — Progress Notes (Signed)
PROGRESS NOTE    Kristen Berry  XBM:[id number] DOB: 21-Feb-1956 DOA: 08/06/2019 PCP: [place], Sarah T, MD   Brief Narrative: Kristen Berry is a 63 y.o. female with history of ESRD on hemodialysis on Monday Wednesday Friday, peripheral vascular disease status post stenting, history of DVT, anemia. Patient presented secondary to left great toe gangrene and found to have evidence of sepsis. She was started on empiric antibiotics. Vascular surgery consulted and patient underwent multiple procedures including bypass, hematoma evacuation, left great toe amputation.  Assessment & Plan:   Principal Problem:   Sepsis ([place]) Active Problems:   Stage 4 chronic kidney disease ([place])   Peripheral artery disease ([facility])   Coronary artery disease without angina pectoris   S/P BKA (below knee amputation), right ([facility])   Anemia in chronic kidney disease   Type 2 diabetes mellitus with diabetic neuropathy, unspecified ([place])   Sepsis Cellulitis/ostemyelitis/left great toe gangrene Patient is s/p left femoral to above knee popliteal bypass per vascular surgery with hematoma evacuation and debridement left foot. Patient managed on IV vancomycin/Zosyn -Will consult ID in AM with regard to antibiotics recommendations going forward -Vascular surgery recommendations -Continue antibiotics for now  ESRD -HD per nephrology  Secondary hyperparathyroidism Secondary to above -Per nephrology  Anemia of kidney disease Stable  History of bilateral DVTs -Continue home Eliquis  Chronic combined systolic and diastolic heart failure Stable. -Continue Coreg  Leukocytosis This has been stable. No evidence of worsened infection. In setting of multiple surgeries as well.  PAD Patient is s/p stents of left SFA. Patient is on Brilinta as an outpatient  Essential hypertension Not well controlled while inpatient. Patient is on Coreg and amlodipine as an outpatient. -Continue Coreg -Restart home  amlodipine  Hallucination Mentioned on chart review. Thought secondary to narcotics. Appears to have resolved.   DVT prophylaxis: Eliquis Code Status:   Code Status: Full Code Family Communication: None Disposition Plan: Discharge pending vascular surgery recommendations   Consultants:   Vascular surgery  Nephrology  Procedures:   11/17: Left femoral to above-knee popliteal artery bypass graft with ipsilateral translocated nonreversed saphenous vein; Exposure of left below-knee popliteal artery; Amputation of left great to an extensive debridement of skin, subtenons tissue, muscle and tendon on the left foot; Popliteal artery endarterectomy, left  11/17: Exploration of recent femoral popliteal bypass; Repair of bleeding from vein bypass; Evacuation of hematoma  11/24:  Ultrasound-guided cannulation left arm AV fistula; Left upper arm fistulogram  Antimicrobials:  Vancomycin  Zosyn    Subjective: No issues this morning.  Objective: Vitals:   08/24/19 0900 08/24/19 0930 08/24/19 1000 08/24/19 1030  BP: (!) 145/57 (!) 147/63 (!) 146/40 (!) 202/77  Pulse: 78 79 78 77  Resp: 17 17 18 18   Temp:      TempSrc:      SpO2:      Weight:      Height:        Intake/Output Summary (Last 24 hours) at 08/24/2019 1049 Last data filed at 08/23/2019 1800 Gross per 24 hour  Intake 357 ml  Output 1 ml  Net 356 ml   Filed Weights   08/19/19 1217 08/21/19 1630 08/24/19 0817  Weight: 82.9 kg 82.1 kg 84.2 kg    Examination:  General exam: Appears calm and comfortable Respiratory system: Clear to auscultation. Respiratory effort normal. Cardiovascular system: S1 & S2 heard, RRR. No murmurs, rubs, gallops or clicks. Gastrointestinal system: Abdomen is nondistended, soft and nontender. No organomegaly or masses felt. Normal bowel  sounds heard. Central nervous system: Alert and oriented. No focal neurological deficits. Extremities: No edema. No calf tenderness. Right BKA. Left  foot with bandage dressing Skin: No cyanosis. No rashes Psychiatry: Judgement and insight appear normal. Mood & affect appropriate.     Data Reviewed: I have personally reviewed following labs and imaging studies  CBC: Recent Labs  Lab 08/18/19 0632  08/20/19 0300 08/21/19 0309 08/22/19 0956 08/23/19 0246 08/24/19 0547  WBC 22.5*   < > 14.3* 15.8* 14.4* 12.8* 13.7*  NEUTROABS 18.5*  --   --   --   --   --   --   HGB 9.2*   < > 8.9* 8.9* 9.8* 10.1* 10.3*  HCT 28.1*   < > 28.7* 28.3* 30.5* 32.2* 34.0*  MCV 93.0   < > 97.3 95.9 95.6 96.7 100.3*  PLT 425*   < > 325 356 413* 394 430*   < > = values in this interval not displayed.   Basic Metabolic Panel: Recent Labs  Lab 08/18/19 2107 08/19/19 0836 08/21/19 0443 08/23/19 0246 08/24/19 0547  NA 127* 129* 132* 134* 133*  K 4.1 3.5 3.2* 4.2 5.0  CL 94* 92* 94* 97* 96*  CO2 16* 21* 24 25 22   GLUCOSE 128* 113* 135* 122* 122*  BUN 29* 32* 26* 24* 35*  CREATININE 4.36* 4.69* 4.06* 3.42* 3.97*  CALCIUM 7.7* 8.1* 8.5* 8.6* 8.8*  PHOS 5.2* 4.8*  --  3.3 3.5   GFR: Estimated Creatinine Clearance: 16.6 mL/min (A) (by C-G formula based on SCr of 3.97 mg/dL (H)). Liver Function Tests: Recent Labs  Lab 08/18/19 2107 08/19/19 0836 08/23/19 0246 08/24/19 0547  ALBUMIN 2.1* 2.0* 2.3* 2.5*   No results for input(s): LIPASE, AMYLASE in the last 168 hours. No results for input(s): AMMONIA in the last 168 hours. Coagulation Profile: No results for input(s): INR, PROTIME in the last 168 hours. Cardiac Enzymes: No results for input(s): CKTOTAL, CKMB, CKMBINDEX, TROPONINI in the last 168 hours. BNP (last 3 results) No results for input(s): PROBNP in the last 8760 hours. HbA1C: No results for input(s): HGBA1C in the last 72 hours. CBG: Recent Labs  Lab 08/23/19 1636 08/23/19 1954 08/24/19 0005 08/24/19 0439 08/24/19 0739  GLUCAP 150* 151* 119* 119* 117*   Lipid Profile: No results for input(s): CHOL, HDL, LDLCALC, TRIG,  CHOLHDL, LDLDIRECT in the last 72 hours. Thyroid Function Tests: No results for input(s): TSH, T4TOTAL, FREET4, T3FREE, THYROIDAB in the last 72 hours. Anemia Panel: No results for input(s): VITAMINB12, FOLATE, FERRITIN, TIBC, IRON, RETICCTPCT in the last 72 hours. Sepsis Labs: No results for input(s): PROCALCITON, LATICACIDVEN in the last 168 hours.  No results found for this or any previous visit (from the past 240 hour(s)).       Radiology Studies: No results found.      Scheduled Meds: . sodium chloride   Intravenous Once  . apixaban  2.5 mg Oral BID  . aspirin EC  81 mg Oral Daily  . atorvastatin  20 mg Oral QHS  . calcitRIOL  0.25 mcg Oral Q M,W,F-HD  . carvedilol  6.25 mg Oral BID WC  . Chlorhexidine Gluconate Cloth  6 each Topical Q0600  . Chlorhexidine Gluconate Cloth  6 each Topical Q0600  . darbepoetin (ARANESP) injection - DIALYSIS  100 mcg Intravenous Q Sun-HD  . feeding supplement (NEPRO CARB STEADY)  237 mL Oral TID BM  . multivitamin  1 tablet Oral QHS  . mupirocin ointment   Nasal  BID  . pantoprazole  40 mg Oral Daily  . potassium chloride  40 mEq Oral Daily  . saccharomyces boulardii  250 mg Oral QHS  . vancomycin variable dose per unstable renal function (pharmacist dosing)   Does not apply See admin instructions   Continuous Infusions: . sodium chloride 10 mL/hr at 08/14/19 1115  . albumin human 25 g (08/19/19 1030)  . cefTAZidime (FORTAZ)  IV     Followed by  . cefTAZidime (FORTAZ)  IV    . [START ON 08/28/2019] vancomycin       LOS: 18 days     Cordelia Poche, MD [facility] 08/24/2019, 10:49 AM  If 7PM-7AM, please contact night-coverage [email or web address]

## 2019-08-25 LAB — CBC
HCT: 29.9 % — ABNORMAL LOW (ref 36.0–46.0)
Hemoglobin: 9.6 g/dL — ABNORMAL LOW (ref 12.0–15.0)
MCH: 31 pg (ref 26.0–34.0)
MCHC: 32.1 g/dL (ref 30.0–36.0)
MCV: 96.5 fL (ref 80.0–100.0)
Platelets: 401 10*3/uL — ABNORMAL HIGH (ref 150–400)
RBC: 3.1 MIL/uL — ABNORMAL LOW (ref 3.87–5.11)
RDW: 18.6 % — ABNORMAL HIGH (ref 11.5–15.5)
WBC: 11.7 10*3/uL — ABNORMAL HIGH (ref 4.0–10.5)
nRBC: 0 % (ref 0.0–0.2)

## 2019-08-25 LAB — GLUCOSE, CAPILLARY
Glucose-Capillary: 118 mg/dL — ABNORMAL HIGH (ref 70–99)
Glucose-Capillary: 129 mg/dL — ABNORMAL HIGH (ref 70–99)
Glucose-Capillary: 131 mg/dL — ABNORMAL HIGH (ref 70–99)
Glucose-Capillary: 137 mg/dL — ABNORMAL HIGH (ref 70–99)
Glucose-Capillary: 140 mg/dL — ABNORMAL HIGH (ref 70–99)
Glucose-Capillary: 140 mg/dL — ABNORMAL HIGH (ref 70–99)

## 2019-08-25 NOTE — Progress Notes (Signed)
PROGRESS NOTE    Kristen Berry  JSH:[id number] DOB: 12-10-1955 DOA: 08/06/2019 PCP: [place], Sarah T, MD   Brief Narrative: Kristen Berry is a 63 y.o. female with history of ESRD on hemodialysis on Monday Wednesday Friday, peripheral vascular disease status post stenting, history of DVT, anemia. Patient presented secondary to left great toe gangrene and found to have evidence of sepsis. She was started on empiric antibiotics. Vascular surgery consulted and patient underwent multiple procedures including bypass, hematoma evacuation, left great toe amputation.  Assessment & Plan:   Principal Problem:   Sepsis ([place]) Active Problems:   Stage 4 chronic kidney disease ([place])   Peripheral artery disease (HCC)   Coronary artery disease without angina pectoris   S/P BKA (below knee amputation), right (HCC)   Anemia in chronic kidney disease   Type 2 diabetes mellitus with diabetic neuropathy, unspecified ([place])   Sepsis Cellulitis/ostemyelitis/left great toe gangrene Patient is s/p left femoral to above knee popliteal bypass per vascular surgery with hematoma evacuation and debridement left foot. Patient managed on IV vancomycin/Zosyn -> Vancomycin/ceftaz -Vascular surgery recommendations: watching wound at this time; hoping to avoid amputation -Will discontinue antibiotics for now since patient had amputation with removal of areas concerning for osteomyelitis. Discussed with vascular surgery -Trend CBC  ESRD -HD per nephrology  Secondary hyperparathyroidism Secondary to above -Per nephrology  Anemia of kidney disease Stable  History of bilateral DVTs -Continue home Eliquis  Chronic combined systolic and diastolic heart failure Stable. -Continue Coreg  Leukocytosis This has been stable. No evidence of worsened infection. In setting of multiple surgeries as well.  PAD Patient is s/p stents of left SFA. Patient is on Brilinta as an outpatient  Essential hypertension Not well  controlled while inpatient. Patient is on Coreg and amlodipine as an outpatient. -Continue Coreg -Restart home amlodipine  Hallucination Mentioned on chart review. Thought secondary to narcotics. Appears to have resolved.   DVT prophylaxis: Eliquis Code Status:   Code Status: Full Code Family Communication: None Disposition Plan: Discharge pending vascular surgery recommendations   Consultants:   Vascular surgery  Nephrology  Procedures:   11/17: Left femoral to above-knee popliteal artery bypass graft with ipsilateral translocated nonreversed saphenous vein; Exposure of left below-knee popliteal artery; Amputation of left great to an extensive debridement of skin, subtenons tissue, muscle and tendon on the left foot; Popliteal artery endarterectomy, left  11/17: Exploration of recent femoral popliteal bypass; Repair of bleeding from vein bypass; Evacuation of hematoma  11/24:  Ultrasound-guided cannulation left arm AV fistula; Left upper arm fistulogram  Antimicrobials:  Vancomycin  Ceftazidime  Zosyn    Subjective: Weak with regard to transfers. Hoping her foot/lower leg can be salvaged  Objective: Vitals:   08/24/19 1951 08/25/19 0454 08/25/19 0821 08/25/19 1344  BP: (!) 111/41 137/61 (!) 125/54 (!) 104/41  Pulse: (!) 108 100 (!) 108 (!) 107  Resp: 20 18 20 18   Temp: 97.6 F (36.4 C) 97.6 F (36.4 C) (!) 97.5 F (36.4 C) 98.3 F (36.8 C)  TempSrc: Oral Oral Oral Oral  SpO2: 97% 98% 100% 98%  Weight:      Height:        Intake/Output Summary (Last 24 hours) at 08/25/2019 1651 Last data filed at 08/25/2019 1230 Gross per 24 hour  Intake 480 ml  Output 76 ml  Net 404 ml   Filed Weights   08/21/19 1630 08/24/19 0817 08/24/19 1225  Weight: 82.1 kg 84.2 kg 80.7 kg    Examination:  General exam: Appears calm and comfortable Respiratory system: Clear to auscultation. Respiratory effort normal. Cardiovascular system: S1 & S2 heard, RRR. No murmurs,  rubs, gallops or clicks. Gastrointestinal system: Abdomen is nondistended, soft and nontender. No organomegaly or masses felt. Normal bowel sounds heard. Central nervous system: Alert and oriented. No focal neurological deficits. Extremities: No edema. No calf tenderness. Right BKA Skin: No cyanosis. No rashes Psychiatry: Judgement and insight appear normal. Mood & affect appropriate.      Data Reviewed: I have personally reviewed following labs and imaging studies  CBC: Recent Labs  Lab 08/21/19 0309 08/22/19 0956 08/23/19 0246 08/24/19 0547 08/25/19 0258  WBC 15.8* 14.4* 12.8* 13.7* 11.7*  HGB 8.9* 9.8* 10.1* 10.3* 9.6*  HCT 28.3* 30.5* 32.2* 34.0* 29.9*  MCV 95.9 95.6 96.7 100.3* 96.5  PLT 356 413* 394 430* 163*   Basic Metabolic Panel: Recent Labs  Lab 08/18/19 2107 08/19/19 0836 08/21/19 0443 08/23/19 0246 08/24/19 0547  NA 127* 129* 132* 134* 133*  K 4.1 3.5 3.2* 4.2 5.0  CL 94* 92* 94* 97* 96*  CO2 16* 21* 24 25 22   GLUCOSE 128* 113* 135* 122* 122*  BUN 29* 32* 26* 24* 35*  CREATININE 4.36* 4.69* 4.06* 3.42* 3.97*  CALCIUM 7.7* 8.1* 8.5* 8.6* 8.8*  PHOS 5.2* 4.8*  --  3.3 3.5   GFR: Estimated Creatinine Clearance: 16.3 mL/min (A) (by C-G formula based on SCr of 3.97 mg/dL (H)). Liver Function Tests: Recent Labs  Lab 08/18/19 2107 08/19/19 0836 08/23/19 0246 08/24/19 0547  ALBUMIN 2.1* 2.0* 2.3* 2.5*   No results for input(s): LIPASE, AMYLASE in the last 168 hours. No results for input(s): AMMONIA in the last 168 hours. Coagulation Profile: No results for input(s): INR, PROTIME in the last 168 hours. Cardiac Enzymes: No results for input(s): CKTOTAL, CKMB, CKMBINDEX, TROPONINI in the last 168 hours. BNP (last 3 results) No results for input(s): PROBNP in the last 8760 hours. HbA1C: No results for input(s): HGBA1C in the last 72 hours. CBG: Recent Labs  Lab 08/24/19 1953 08/25/19 0000 08/25/19 0452 08/25/19 0745 08/25/19 1129  GLUCAP 157*  140* 140* 137* 129*   Lipid Profile: No results for input(s): CHOL, HDL, LDLCALC, TRIG, CHOLHDL, LDLDIRECT in the last 72 hours. Thyroid Function Tests: No results for input(s): TSH, T4TOTAL, FREET4, T3FREE, THYROIDAB in the last 72 hours. Anemia Panel: No results for input(s): VITAMINB12, FOLATE, FERRITIN, TIBC, IRON, RETICCTPCT in the last 72 hours. Sepsis Labs: No results for input(s): PROCALCITON, LATICACIDVEN in the last 168 hours.  No results found for this or any previous visit (from the past 240 hour(s)).       Radiology Studies: No results found.      Scheduled Meds: . sodium chloride   Intravenous Once  . apixaban  2.5 mg Oral BID  . aspirin EC  81 mg Oral Daily  . atorvastatin  20 mg Oral QHS  . calcitRIOL  0.25 mcg Oral Q M,W,F-HD  . carvedilol  6.25 mg Oral BID WC  . Chlorhexidine Gluconate Cloth  6 each Topical Q0600  . Chlorhexidine Gluconate Cloth  6 each Topical Q0600  . darbepoetin (ARANESP) injection - DIALYSIS  100 mcg Intravenous Q Sun-HD  . feeding supplement (NEPRO CARB STEADY)  237 mL Oral TID BM  . multivitamin  1 tablet Oral QHS  . pantoprazole  40 mg Oral Daily  . potassium chloride  40 mEq Oral Daily  . saccharomyces boulardii  250 mg Oral QHS  . vancomycin  variable dose per unstable renal function (pharmacist dosing)   Does not apply See admin instructions   Continuous Infusions: . sodium chloride 10 mL/hr at 08/14/19 1115  . albumin human 25 g (08/19/19 1030)  . cefTAZidime (FORTAZ)  IV     Followed by  . cefTAZidime (FORTAZ)  IV    . [START ON 08/27/2019] cefTAZidime (FORTAZ)  IV    . [START ON 08/27/2019] vancomycin       LOS: 19 days     Kristen Poche, MD [facility] 08/25/2019, 4:51 PM  If 7PM-7AM, please contact night-coverage [email or web address]

## 2019-08-25 NOTE — Plan of Care (Signed)
  Problem: Education: Goal: Knowledge of General Education information will improve Description: Including pain rating scale, medication(s)/side effects and non-pharmacologic comfort measures 08/25/2019 1331 by Glenard Haring, RN Outcome: Not Progressing 08/25/2019 1331 by Glenard Haring, RN Outcome: Progressing   Problem: Health Behavior/Discharge Planning: Goal: Ability to manage health-related needs will improve 08/25/2019 1331 by Glenard Haring, RN Outcome: Not Progressing 08/25/2019 1331 by Glenard Haring, RN Outcome: Progressing   Problem: Clinical Measurements: Goal: Ability to maintain clinical measurements within normal limits will improve 08/25/2019 1331 by Glenard Haring, RN Outcome: Not Progressing 08/25/2019 1331 by Glenard Haring, RN Outcome: Progressing Goal: Will remain free from infection 08/25/2019 1331 by Glenard Haring, RN Outcome: Not Progressing 08/25/2019 1331 by Glenard Haring, RN Outcome: Progressing Goal: Diagnostic test results will improve 08/25/2019 1331 by Glenard Haring, RN Outcome: Not Progressing 08/25/2019 1331 by Glenard Haring, RN Outcome: Progressing Goal: Respiratory complications will improve 08/25/2019 1331 by Glenard Haring, RN Outcome: Not Progressing 08/25/2019 1331 by Glenard Haring, RN Outcome: Progressing Goal: Cardiovascular complication will be avoided 08/25/2019 1331 by Glenard Haring, RN Outcome: Not Progressing 08/25/2019 1331 by Glenard Haring, RN Outcome: Progressing   Problem: Activity: Goal: Risk for activity intolerance will decrease 08/25/2019 1331 by Glenard Haring, RN Outcome: Not Progressing 08/25/2019 1331 by Glenard Haring, RN Outcome: Progressing   Problem: Skin Integrity: Goal: Risk for impaired skin integrity will decrease 08/25/2019 1331 by Glenard Haring, RN Outcome: Not Progressing 08/25/2019 1331 by Glenard Haring, RN Outcome: Progressing    Problem: Education: Goal: Knowledge of prescribed regimen will improve 08/25/2019 1331 by Glenard Haring, RN Outcome: Not Progressing 08/25/2019 1331 by Glenard Haring, RN Outcome: Progressing   Problem: Activity: Goal: Ability to tolerate increased activity will improve 08/25/2019 1331 by Glenard Haring, RN Outcome: Not Progressing 08/25/2019 1331 by Glenard Haring, RN Outcome: Progressing   Problem: Bowel/Gastric: Goal: Gastrointestinal status for postoperative course will improve 08/25/2019 1331 by Glenard Haring, RN Outcome: Not Progressing 08/25/2019 1331 by Glenard Haring, RN Outcome: Progressing   Problem: Clinical Measurements: Goal: Postoperative complications will be avoided or minimized 08/25/2019 1331 by Glenard Haring, RN Outcome: Not Progressing 08/25/2019 1331 by Glenard Haring, RN Outcome: Progressing Goal: Signs and symptoms of graft occlusion will improve 08/25/2019 1331 by Glenard Haring, RN Outcome: Not Progressing 08/25/2019 1331 by Glenard Haring, RN Outcome: Progressing   Problem: Skin Integrity: Goal: Demonstration of wound healing without infection will improve 08/25/2019 1331 by Glenard Haring, RN Outcome: Not Progressing 08/25/2019 1331 by Glenard Haring, RN Outcome: Progressing   Problem: Education: Goal: Knowledge of disease and its progression will improve 08/25/2019 1331 by Glenard Haring, RN Outcome: Not Progressing 08/25/2019 1331 by Glenard Haring, RN Outcome: Progressing   Problem: Fluid Volume: Goal: Compliance with measures to maintain balanced fluid volume will improve 08/25/2019 1331 by Glenard Haring, RN Outcome: Not Progressing 08/25/2019 1331 by Glenard Haring, RN Outcome: Progressing   Problem: Nutritional: Goal: Ability to make healthy dietary choices will improve 08/25/2019 1331 by Glenard Haring, RN Outcome: Not Progressing 08/25/2019 1331 by Glenard Haring,  RN Outcome: Progressing   Problem: Clinical Measurements: Goal: Complications related to the disease process, condition or treatment will be avoided or minimized 08/25/2019 1331 by Glenard Haring, RN Outcome: Not Progressing 08/25/2019 1331 by Glenard Haring, RN Outcome: Progressing

## 2019-08-25 NOTE — Progress Notes (Addendum)
[facility] Progress Note   Subjective:   Pt seen in room. Alert, talking on the phone and eating lunch. Reports she continues to feel better. Occasional dizziness with exertion. Denies SOB, cough, CP, palpitations, abdominal pain, N/V/D.   Objective Vitals:   08/24/19 1942 08/24/19 1951 08/25/19 0454 08/25/19 0821  BP: (!) 116/48 (!) 111/41 137/61 (!) 125/54  Pulse: (!) 111 (!) 108 100 (!) 108  Resp:  20 18 20   Temp:  97.6 F (36.4 C) 97.6 F (36.4 C) (!) 97.5 F (36.4 C)  TempSrc:  Oral Oral Oral  SpO2:  97% 98% 100%  Weight:      Height:       Physical Exam General:Well developed,well nourished female. Alert and in NAD Heart:Slightly tachycardic, regular rhythm. No murmurs, rubs or gallops Lungs:No wheezing, rhonchi or rales auscultated Abdomen:Soft, non-tender, non-distended. +BS Extremities:No edema b/l lower extremities. L foot dressing present Dialysis Access:RIJ TDC, LUE AVF with +bruit  Additional Objective Labs: Basic Metabolic Panel: Recent Labs  Lab 08/19/19 0836 08/21/19 0443 08/23/19 0246 08/24/19 0547  NA 129* 132* 134* 133*  K 3.5 3.2* 4.2 5.0  CL 92* 94* 97* 96*  CO2 21* 24 25 22   GLUCOSE 113* 135* 122* 122*  BUN 32* 26* 24* 35*  CREATININE 4.69* 4.06* 3.42* 3.97*  CALCIUM 8.1* 8.5* 8.6* 8.8*  PHOS 4.8*  --  3.3 3.5   Liver Function Tests: Recent Labs  Lab 08/19/19 0836 08/23/19 0246 08/24/19 0547  ALBUMIN 2.0* 2.3* 2.5*   No results for input(s): LIPASE, AMYLASE in the last 168 hours. CBC: Recent Labs  Lab 08/21/19 0309 08/22/19 0956 08/23/19 0246 08/24/19 0547 08/25/19 0258  WBC 15.8* 14.4* 12.8* 13.7* 11.7*  HGB 8.9* 9.8* 10.1* 10.3* 9.6*  HCT 28.3* 30.5* 32.2* 34.0* 29.9*  MCV 95.9 95.6 96.7 100.3* 96.5  PLT 356 413* 394 430* 401*   Blood Culture    Component Value Date/Time   SDES BLOOD RIGHT HAND 08/06/2019 2144   SPECREQUEST  08/06/2019 2144    BOTTLES DRAWN AEROBIC ONLY Blood Culture results  may not be optimal due to an inadequate volume of blood received in culture bottles   CULT  08/06/2019 2144    NO GROWTH 5 DAYS Performed at [facility] [address] [place], [place] [address]    REPTSTATUS 08/11/2019 FINAL 08/06/2019 2144    CBG: Recent Labs  Lab 08/24/19 1953 08/25/19 0000 08/25/19 0452 08/25/19 0745 08/25/19 1129  GLUCAP 157* 140* 140* 137* 129*   Medications: . sodium chloride 10 mL/hr at 08/14/19 1115  . albumin human 25 g (08/19/19 1030)  . cefTAZidime (FORTAZ)  IV     Followed by  . cefTAZidime (FORTAZ)  IV    . [START ON 08/27/2019] cefTAZidime (FORTAZ)  IV    . [START ON 08/27/2019] vancomycin     . sodium chloride   Intravenous Once  . apixaban  2.5 mg Oral BID  . aspirin EC  81 mg Oral Daily  . atorvastatin  20 mg Oral QHS  . calcitRIOL  0.25 mcg Oral Q M,W,F-HD  . carvedilol  6.25 mg Oral BID WC  . Chlorhexidine Gluconate Cloth  6 each Topical Q0600  . Chlorhexidine Gluconate Cloth  6 each Topical Q0600  . darbepoetin (ARANESP) injection - DIALYSIS  100 mcg Intravenous Q Sun-HD  . feeding supplement (NEPRO CARB STEADY)  237 mL Oral TID BM  . multivitamin  1 tablet Oral QHS  . pantoprazole  40 mg Oral Daily  . potassium chloride  40 mEq Oral Daily  . saccharomyces boulardii  250 mg Oral QHS  . vancomycin variable dose per unstable renal function (pharmacist dosing)   Does not apply See admin instructions    Dialysis Orders: MWF [facility]  4h 350/800 80kg 2/2.25 bath TDC Heparin 2400 -LUE AVF (needs revision afterLeft foot PAD issuesresolved) calc 0.25 tiw  Assessment/Plan: 1.Left Foot Gangrene with PAD -SP L femoral to AK pop bypass +L grt toe amp on 07/1719,with subsequent hematoma evacuation L groin. On IV vancomycin and ceftazidime with HD. Missed dose of ceftazidime yesterday. Pharmacy consulted and will give both antibiotics with HD moving forward- ok for vancomycin and ceftazidime to be given at the same  time.  Plan for ongoing local wound care with continued risk of amputation per [facility]. 2. ESRD - normally MWF via TDC. Previously off schedule due to holiday/ instability, now back on MWF schedule. Next HD 08/27/19. K+ 5.0 pre-HD yesterday, getting potassium 40 mEq daily. Recheck RFP in the AM, may be able to decrease this if K+ is stable.  3.HTN/volume-BP variable. Previously low and BP meds held, then back up and low dose coreg restarted. Adequately controlled now.Above EDW about 0.7 kg per bed weights. Net UF 3L yesterday and edema improved. 4. Anemia-HGBdropped requiring 2 units PRBC on 11/17, 1 unit on 11/18 and 1 unit on 11/19. Receivedaranesp 100 mcg on 11/22. Hgb 9.6 today.  5. Secondary hyperparathyroidism-Corrected calcium 10.0. Follow, d/c calcitriol if elevates further. Phos 3.5, binder on hold. 6. AMS - multifactorial,seems to be resolved. Per primary team.   Anice Paganini, PA-C 08/25/2019, 12:51 PM  [facility] Pager: [phone]  Pt seen, examined and agree w A/P as above.  [name]  MD 08/25/2019, 5:13 PM

## 2019-08-25 NOTE — Progress Notes (Addendum)
[facility]  Subjective  - Awake and comfortable alert.   Objective 137/61 100 97.6 F (36.4 C) (Oral) 18 98%  Intake/Output Summary (Last 24 hours) at 08/25/2019 0737 Last data filed at 08/24/2019 1225 Gross per 24 hour  Intake -  Output 3000 ml  Net -3000 ml    Doppler signal AT/peroneal Plantar foot with edges of fat necrosis and tendon exposed.  No significant change from 08/23/2019. Wet to dry dressing applied Lungs non labored breathing  Assessment/Planning: POD #  3 Left upper arm fistulogram pending future intervention  Cont. HD via [facility]  08/14/19 #1: Left femoral to above-knee popliteal artery bypass graft with ipsilateral translocated nonreversed saphenous vein #2: Exposure of left below-knee popliteal artery #3: Amputation of left great to an extensive debridement of skin, subtenons tissue, muscle and tendon on the left foot #4: Wound VAC placement #5: Popliteal artery endarterectomy, left Return to OR same day Procedure:#1 exploration of recent femoral popliteal bypass #2 Repair of bleeding from vein bypass #3 Evacuation of hematoma    Cont. Wet to dry dressings and allow left foot to declare itself.      Roxy Horseman 08/25/2019 7:37 AM --  Laboratory Lab Results: Recent Labs    08/24/19 0547 08/25/19 0258  WBC 13.7* 11.7*  HGB 10.3* 9.6*  HCT 34.0* 29.9*  PLT 430* 401*   BMET Recent Labs    08/23/19 0246 08/24/19 0547  NA 134* 133*  K 4.2 5.0  CL 97* 96*  CO2 25 22  GLUCOSE 122* 122*  BUN 24* 35*  CREATININE 3.42* 3.97*  CALCIUM 8.6* 8.8*    COAG Lab Results  Component Value Date   INR 1.6 (H) 08/07/2019   INR 1.2 06/25/2019   INR 1.1 02/15/2019   No results found for: PTT  I have examined the patient, reviewed and agree with  above.  Curt Jews, MD 08/25/2019 8:35 AM

## 2019-08-26 LAB — CBC
HCT: 31.3 % — ABNORMAL LOW (ref 36.0–46.0)
Hemoglobin: 9.6 g/dL — ABNORMAL LOW (ref 12.0–15.0)
MCH: 30 pg (ref 26.0–34.0)
MCHC: 30.7 g/dL (ref 30.0–36.0)
MCV: 97.8 fL (ref 80.0–100.0)
Platelets: 490 10*3/uL — ABNORMAL HIGH (ref 150–400)
RBC: 3.2 MIL/uL — ABNORMAL LOW (ref 3.87–5.11)
RDW: 18.5 % — ABNORMAL HIGH (ref 11.5–15.5)
WBC: 13.1 10*3/uL — ABNORMAL HIGH (ref 4.0–10.5)
nRBC: 0.2 % (ref 0.0–0.2)

## 2019-08-26 LAB — RENAL FUNCTION PANEL
Albumin: 2.4 g/dL — ABNORMAL LOW (ref 3.5–5.0)
Anion gap: 12 (ref 5–15)
BUN: 33 mg/dL — ABNORMAL HIGH (ref 8–23)
CO2: 24 mmol/L (ref 22–32)
Calcium: 8.4 mg/dL — ABNORMAL LOW (ref 8.9–10.3)
Chloride: 94 mmol/L — ABNORMAL LOW (ref 98–111)
Creatinine, Ser: 3.38 mg/dL — ABNORMAL HIGH (ref 0.44–1.00)
GFR calc Af Amer: 16 mL/min — ABNORMAL LOW (ref >60)
GFR calc non Af Amer: 14 mL/min — ABNORMAL LOW (ref >60)
Glucose, Bld: 161 mg/dL — ABNORMAL HIGH (ref 70–99)
Phosphorus: 3.5 mg/dL (ref 2.5–4.6)
Potassium: 4.6 mmol/L (ref 3.5–5.1)
Sodium: 130 mmol/L — ABNORMAL LOW (ref 135–145)

## 2019-08-26 LAB — GLUCOSE, CAPILLARY
Glucose-Capillary: 134 mg/dL — ABNORMAL HIGH (ref 70–99)
Glucose-Capillary: 138 mg/dL — ABNORMAL HIGH (ref 70–99)
Glucose-Capillary: 149 mg/dL — ABNORMAL HIGH (ref 70–99)
Glucose-Capillary: 127 mg/dL — ABNORMAL HIGH (ref 70–99)

## 2019-08-26 MED ORDER — DARBEPOETIN ALFA 100 MCG/0.5ML IJ SOSY
100.0000 ug | PREFILLED_SYRINGE | INTRAMUSCULAR | Status: DC
  Administered 2019-08-27: 12:00:00 100 ug via INTRAVENOUS

## 2019-08-26 MED ORDER — POTASSIUM CHLORIDE CRYS ER 20 MEQ PO TBCR: 20.0000 meq | EXTENDED_RELEASE_TABLET | Freq: Every day | ORAL | Status: DC

## 2019-08-26 NOTE — Progress Notes (Addendum)
[facility] [place]  Subjective  - No new complaints mild pain in the left foot, but tolerable.     Objective (!) 116/53 90 97.6 F (36.4 C) (Oral) 20 100%  Intake/Output Summary (Last 24 hours) at 08/26/2019 0725 Last data filed at 08/25/2019 1852 Gross per 24 hour  Intake 600 ml  Output 76 ml  Net 524 ml    Left foot dressing changed by RN.  Clean and dry. Doppler signal AT/peroneal intact  Lings non labored breathing Gen NAD  Assessment/Planning: POD # 4 Left upper arm fistulogrampending future intervention  Cont. HD via [facility] 08/14/19 #1: Left femoral to above-knee popliteal artery bypass graft with ipsilateral translocated nonreversed saphenous vein #2: Exposure of left below-knee popliteal artery #3: Amputation of left great to an extensive debridement of skin, subtenons tissue, muscle and tendon on the left foot #4: Wound VAC placement #5: Popliteal artery endarterectomy, left Return to OR same day Procedure:#1 exploration of recent femoral popliteal bypass #2 Repair of bleeding from vein bypass #3 Evacuation of hematoma  Cont. Wet to dry dressings and allow left foot to declare itself. Continue IV antibiotics. Dr. Donzetta Matters will check foot wound tomorrow.  She may need further debridement verse primary BKA.   Kristen Berry 08/26/2019 7:25 AM --  Laboratory Lab Results: Recent Labs    08/25/19 0258 08/26/19 0315  WBC 11.7* 13.1*  HGB 9.6* 9.6*  HCT 29.9* 31.3*  PLT 401* 490*   BMET Recent Labs    08/24/19 0547 08/26/19 0318  NA 133* 130*  K 5.0 4.6  CL 96* 94*  CO2 22 24  GLUCOSE 122* 161*  BUN 35* 33*  CREATININE 3.97* 3.38*  CALCIUM 8.8* 8.4*    COAG Lab Results  Component Value Date   INR 1.6 (H) 08/07/2019   INR 1.2 06/25/2019   INR 1.1 02/15/2019    No results found for: PTT

## 2019-08-26 NOTE — Progress Notes (Signed)
[facility] Progress Note   Subjective:   Patient seen in room. Feeling well, no new concerns. Denies SOB, orthopnea, cough, CP, palpitations, abdominal pain, nausea.   Objective Vitals:   08/25/19 2052 08/26/19 0314 08/26/19 0958 08/26/19 1119  BP: (!) 115/56 (!) 116/53 (!) 159/82 (!) 133/58  Pulse: (!) 102 90 91 99  Resp: 16 20  18   Temp: 97.6 F (36.4 C) 97.6 F (36.4 C) 97.8 F (36.6 C) 98 F (36.7 C)  TempSrc: Oral Oral Oral Oral  SpO2: 99% 100% 100% 100%  Weight:      Height:       Physical Exam General:Well developed,well nourished female. Alert and in NAD Heart:RRR. No murmurs, rubs or gallops Lungs:No wheezing, rhonchi or rales auscultated Abdomen:Soft, non-tender, non-distended. +BS Extremities:2+ edema b/l lower extremities. L foot dressing present Dialysis Access:RIJ TDC, LUE AVF with +bruit  Additional Objective Labs: Basic Metabolic Panel: Recent Labs  Lab 08/23/19 0246 08/24/19 0547 08/26/19 0318  NA 134* 133* 130*  K 4.2 5.0 4.6  CL 97* 96* 94*  CO2 25 22 24   GLUCOSE 122* 122* 161*  BUN 24* 35* 33*  CREATININE 3.42* 3.97* 3.38*  CALCIUM 8.6* 8.8* 8.4*  PHOS 3.3 3.5 3.5   Liver Function Tests: Recent Labs  Lab 08/23/19 0246 08/24/19 0547 08/26/19 0318  ALBUMIN 2.3* 2.5* 2.4*   CBC: Recent Labs  Lab 08/22/19 0956 08/23/19 0246 08/24/19 0547 08/25/19 0258 08/26/19 0315  WBC 14.4* 12.8* 13.7* 11.7* 13.1*  HGB 9.8* 10.1* 10.3* 9.6* 9.6*  HCT 30.5* 32.2* 34.0* 29.9* 31.3*  MCV 95.6 96.7 100.3* 96.5 97.8  PLT 413* 394 430* 401* 490*   Blood Culture    Component Value Date/Time   SDES BLOOD RIGHT HAND 08/06/2019 2144   SPECREQUEST  08/06/2019 2144    BOTTLES DRAWN AEROBIC ONLY Blood Culture results may not be optimal due to an inadequate volume of blood received in culture bottles   CULT  08/06/2019 2144    NO GROWTH 5 DAYS Performed at [facility] [address] [place], [place] [address]    REPTSTATUS 08/11/2019 FINAL 08/06/2019 2144    CBG: Recent Labs  Lab 08/25/19 1129 08/25/19 1715 08/25/19 2138 08/26/19 0636 08/26/19 1134  GLUCAP 129* 118* 131* 134* 138*   Medications: . sodium chloride 10 mL/hr at 08/14/19 1115  . albumin human 25 g (08/19/19 1030)  . [START ON 08/27/2019] cefTAZidime (FORTAZ)  IV     . sodium chloride   Intravenous Once  . apixaban  2.5 mg Oral BID  . aspirin EC  81 mg Oral Daily  . atorvastatin  20 mg Oral QHS  . calcitRIOL  0.25 mcg Oral Q M,W,F-HD  . carvedilol  6.25 mg Oral BID WC  . Chlorhexidine Gluconate Cloth  6 each Topical Q0600  . Chlorhexidine Gluconate Cloth  6 each Topical Q0600  . darbepoetin (ARANESP) injection - DIALYSIS  100 mcg Intravenous Q Sun-HD  . feeding supplement (NEPRO CARB STEADY)  237 mL Oral TID BM  . multivitamin  1 tablet Oral QHS  . pantoprazole  40 mg Oral Daily  . potassium chloride  40 mEq Oral Daily  . saccharomyces boulardii  250 mg Oral QHS    Dialysis Orders: MWF [facility]  4h 350/800 80kg 2/2.25 bath TDC Heparin 2400 -LUE AVF (needs revision afterLeft foot PAD issuesresolved) calc 0.25 tiw  Assessment/Plan: 1.Left Foot Gangrene with PAD -SP L femoral to AK pop bypass+L grt toe ampon 07/1719,with  subsequent hematoma evacuation L groin. On IVvancomycin and ceftazidime with HD.Missed dose of ceftazidime with last HD due to dosing miscommunication. Pharmacy consulted and will give both antibiotics with HD moving forward- ok for vancomycin and ceftazidime to be given at the same time. Primary has discontinued antibiotics for now. Per vascular/primary.  2. ESRD - MWF via TDC. K+ 4.6. On potassium 40 mEq daily with K= stable in the upper 4's to 5, will decrease to 20 mEq daily and follow. Next HD 08/27/19.  3.HTN/volume-BP previously low and BP meds held, then back up and low dose coreg restarted. Adequately controlled now.Edema worse again today, UFG 3L with HD tomorrow.  4.  Anemia-HGBdropped requiring 2 units PRBC on 11/17, 1 unit on 11/18 and 1 unit on 11/19. Receivedaranesp 100 mcg on 11/22. Hgb stable at 9.6 today.  5. Secondary hyperparathyroidism-Corrected calcium 9.7, continue calcitriol. Phos 3.5, stable without binder.  6. AMS - multifactorial,seems to be resolved. Per primary team.  Anice Paganini, PA-C 08/26/2019, 2:49 PM  [facility] Pager: [phone]

## 2019-08-26 NOTE — Progress Notes (Signed)
Wet to dry dressing change performed at approximately 2200 on 08/25/19. Odor and purulent drainage noted as dressing was changed. Dressing replaced wet to dry with gauze, kerlex, and ace wrap. Will continue to monitor.

## 2019-08-26 NOTE — Progress Notes (Signed)
PROGRESS NOTE    Kristen Berry  MHD:[id number] DOB: 01-05-1956 DOA: 08/06/2019 PCP: Martinique, Sarah T, MD   Brief Narrative: Kristen Berry is a 63 y.o. female with history of ESRD on hemodialysis on Monday Wednesday Friday, peripheral vascular disease status post stenting, history of DVT, anemia. Patient presented secondary to left great toe gangrene and found to have evidence of sepsis. She was started on empiric antibiotics. Vascular surgery consulted and patient underwent multiple procedures including bypass, hematoma evacuation, left great toe amputation.  Assessment & Plan:   Principal Problem:   Sepsis ([place]) Active Problems:   Stage 4 chronic kidney disease ([place])   Peripheral artery disease (HCC)   Coronary artery disease without angina pectoris   S/P BKA (below knee amputation), right (HCC)   Anemia in chronic kidney disease   Type 2 diabetes mellitus with diabetic neuropathy, unspecified ([place])   Sepsis Cellulitis/ostemyelitis/left great toe gangrene Patient is s/p left femoral to above knee popliteal bypass per vascular surgery with hematoma evacuation and debridement left foot. Patient managed on IV vancomycin/Zosyn -> Vancomycin/ceftaz. WBC stable. -Vascular surgery recommendations: watching wound at this time; hoping to avoid amputation -Will discontinue antibiotics for now since patient had amputation with removal of areas concerning for osteomyelitis. Discussed with vascular surgery -Trend CBC  ESRD -HD per nephrology  Secondary hyperparathyroidism Secondary to above -Per nephrology  Anemia of kidney disease Stable  History of bilateral DVTs -Continue home Eliquis  Chronic combined systolic and diastolic heart failure Stable. -Continue Coreg  Leukocytosis This has been stable. No evidence of worsened infection. In setting of multiple surgeries as well. -Trend CBC  PAD Patient is s/p stents of left SFA. Patient is on Brilinta as an outpatient  Essential  hypertension Better controlled overnight. Patient is on Coreg and amlodipine as an outpatient. Planned to start amlodipine which did not happen. Will hold off on further control in setting of ESRD patient on HD. -Continue Coreg  Hallucination Mentioned on chart review. Thought secondary to narcotics. Appears to have resolved.   DVT prophylaxis: Eliquis Code Status:   Code Status: Full Code Family Communication: None Disposition Plan: Discharge pending vascular surgery recommendations   Consultants:   Vascular surgery  Nephrology  Procedures:   11/17: Left femoral to above-knee popliteal artery bypass graft with ipsilateral translocated nonreversed saphenous vein; Exposure of left below-knee popliteal artery; Amputation of left great to an extensive debridement of skin, subtenons tissue, muscle and tendon on the left foot; Popliteal artery endarterectomy, left  11/17: Exploration of recent femoral popliteal bypass; Repair of bleeding from vein bypass; Evacuation of hematoma  11/24:  Ultrasound-guided cannulation left arm AV fistula; Left upper arm fistulogram  Antimicrobials:  Vancomycin  Ceftazidime  Zosyn    Subjective: No concerns overnight.  Objective: Vitals:   08/25/19 0821 08/25/19 1344 08/25/19 2052 08/26/19 0314  BP: (!) 125/54 (!) 104/41 (!) 115/56 (!) 116/53  Pulse: (!) 108 (!) 107 (!) 102 90  Resp: 20 18 16 20   Temp: (!) 97.5 F (36.4 C) 98.3 F (36.8 C) 97.6 F (36.4 C) 97.6 F (36.4 C)  TempSrc: Oral Oral Oral Oral  SpO2: 100% 98% 99% 100%  Weight:      Height:        Intake/Output Summary (Last 24 hours) at 08/26/2019 0836 Last data filed at 08/25/2019 1852 Gross per 24 hour  Intake 480 ml  Output 76 ml  Net 404 ml   Filed Weights   08/21/19 1630 08/24/19 0817 08/24/19 1225  Weight: 82.1 kg 84.2 kg 80.7 kg    Examination:  General exam: Appears calm and comfortable Respiratory system: Clear to auscultation. Respiratory effort  normal. Cardiovascular system: S1 & S2 heard, RRR. No murmurs, rubs, gallops or clicks. Gastrointestinal system: Abdomen is nondistended, soft and nontender. No organomegaly or masses felt. Normal bowel sounds heard. Central nervous system: Alert and oriented. No focal neurological deficits. Extremities: No edema. No calf tenderness. Right BKA Skin: No cyanosis. No rashes Psychiatry: Judgement and insight appear normal. Mood & affect appropriate.      Data Reviewed: I have personally reviewed following labs and imaging studies  CBC: Recent Labs  Lab 08/22/19 0956 08/23/19 0246 08/24/19 0547 08/25/19 0258 08/26/19 0315  WBC 14.4* 12.8* 13.7* 11.7* 13.1*  HGB 9.8* 10.1* 10.3* 9.6* 9.6*  HCT 30.5* 32.2* 34.0* 29.9* 31.3*  MCV 95.6 96.7 100.3* 96.5 97.8  PLT 413* 394 430* 401* 557*   Basic Metabolic Panel: Recent Labs  Lab 08/21/19 0443 08/23/19 0246 08/24/19 0547 08/26/19 0318  NA 132* 134* 133* 130*  K 3.2* 4.2 5.0 4.6  CL 94* 97* 96* 94*  CO2 24 25 22 24   GLUCOSE 135* 122* 122* 161*  BUN 26* 24* 35* 33*  CREATININE 4.06* 3.42* 3.97* 3.38*  CALCIUM 8.5* 8.6* 8.8* 8.4*  PHOS  --  3.3 3.5 3.5   GFR: Estimated Creatinine Clearance: 19.1 mL/min (A) (by C-G formula based on SCr of 3.38 mg/dL (H)). Liver Function Tests: Recent Labs  Lab 08/23/19 0246 08/24/19 0547 08/26/19 0318  ALBUMIN 2.3* 2.5* 2.4*   No results for input(s): LIPASE, AMYLASE in the last 168 hours. No results for input(s): AMMONIA in the last 168 hours. Coagulation Profile: No results for input(s): INR, PROTIME in the last 168 hours. Cardiac Enzymes: No results for input(s): CKTOTAL, CKMB, CKMBINDEX, TROPONINI in the last 168 hours. BNP (last 3 results) No results for input(s): PROBNP in the last 8760 hours. HbA1C: No results for input(s): HGBA1C in the last 72 hours. CBG: Recent Labs  Lab 08/25/19 0745 08/25/19 1129 08/25/19 1715 08/25/19 2138 08/26/19 0636  GLUCAP 137* 129* 118* 131*  134*   Lipid Profile: No results for input(s): CHOL, HDL, LDLCALC, TRIG, CHOLHDL, LDLDIRECT in the last 72 hours. Thyroid Function Tests: No results for input(s): TSH, T4TOTAL, FREET4, T3FREE, THYROIDAB in the last 72 hours. Anemia Panel: No results for input(s): VITAMINB12, FOLATE, FERRITIN, TIBC, IRON, RETICCTPCT in the last 72 hours. Sepsis Labs: No results for input(s): PROCALCITON, LATICACIDVEN in the last 168 hours.  No results found for this or any previous visit (from the past 240 hour(s)).       Radiology Studies: No results found.      Scheduled Meds: . sodium chloride   Intravenous Once  . apixaban  2.5 mg Oral BID  . aspirin EC  81 mg Oral Daily  . atorvastatin  20 mg Oral QHS  . calcitRIOL  0.25 mcg Oral Q M,W,F-HD  . carvedilol  6.25 mg Oral BID WC  . Chlorhexidine Gluconate Cloth  6 each Topical Q0600  . Chlorhexidine Gluconate Cloth  6 each Topical Q0600  . darbepoetin (ARANESP) injection - DIALYSIS  100 mcg Intravenous Q Sun-HD  . feeding supplement (NEPRO CARB STEADY)  237 mL Oral TID BM  . multivitamin  1 tablet Oral QHS  . pantoprazole  40 mg Oral Daily  . potassium chloride  40 mEq Oral Daily  . saccharomyces boulardii  250 mg Oral QHS   Continuous Infusions: .  sodium chloride 10 mL/hr at 08/14/19 1115  . albumin human 25 g (08/19/19 1030)  . [START ON 08/27/2019] cefTAZidime (FORTAZ)  IV       LOS: 20 days     Cordelia Poche, MD [facility] 08/26/2019, 8:36 AM  If 7PM-7AM, please contact night-coverage [email or web address]

## 2019-08-27 LAB — CBC
HCT: 28.6 % — ABNORMAL LOW (ref 36.0–46.0)
Hemoglobin: 8.9 g/dL — ABNORMAL LOW (ref 12.0–15.0)
MCH: 30.5 pg (ref 26.0–34.0)
MCHC: 31.1 g/dL (ref 30.0–36.0)
MCV: 97.9 fL (ref 80.0–100.0)
Platelets: 462 10*3/uL — ABNORMAL HIGH (ref 150–400)
RBC: 2.92 MIL/uL — ABNORMAL LOW (ref 3.87–5.11)
RDW: 18.4 % — ABNORMAL HIGH (ref 11.5–15.5)
WBC: 12.7 10*3/uL — ABNORMAL HIGH (ref 4.0–10.5)
nRBC: 0 % (ref 0.0–0.2)

## 2019-08-27 LAB — RENAL FUNCTION PANEL
Albumin: 2.4 g/dL — ABNORMAL LOW (ref 3.5–5.0)
Anion gap: 14 (ref 5–15)
BUN: 42 mg/dL — ABNORMAL HIGH (ref 8–23)
CO2: 20 mmol/L — ABNORMAL LOW (ref 22–32)
Calcium: 8.4 mg/dL — ABNORMAL LOW (ref 8.9–10.3)
Chloride: 97 mmol/L — ABNORMAL LOW (ref 98–111)
Creatinine, Ser: 3.72 mg/dL — ABNORMAL HIGH (ref 0.44–1.00)
GFR calc Af Amer: 14 mL/min — ABNORMAL LOW (ref >60)
GFR calc non Af Amer: 12 mL/min — ABNORMAL LOW (ref >60)
Glucose, Bld: 128 mg/dL — ABNORMAL HIGH (ref 70–99)
Phosphorus: 4.7 mg/dL — ABNORMAL HIGH (ref 2.5–4.6)
Potassium: 5.2 mmol/L — ABNORMAL HIGH (ref 3.5–5.1)
Sodium: 131 mmol/L — ABNORMAL LOW (ref 135–145)

## 2019-08-27 LAB — GLUCOSE, CAPILLARY
Glucose-Capillary: 120 mg/dL — ABNORMAL HIGH (ref 70–99)
Glucose-Capillary: 116 mg/dL — ABNORMAL HIGH (ref 70–99)
Glucose-Capillary: 134 mg/dL — ABNORMAL HIGH (ref 70–99)
Glucose-Capillary: 167 mg/dL — ABNORMAL HIGH (ref 70–99)

## 2019-08-27 MED ORDER — CALCITRIOL 0.25 MCG PO CAPS
ORAL_CAPSULE | ORAL | Status: AC
  Administered 2019-08-27: 0.25 ug via ORAL
  Filled 2019-08-27: qty 1

## 2019-08-27 MED ORDER — HEPARIN SODIUM (PORCINE) 1000 UNIT/ML DIALYSIS
20.0000 [IU]/kg | INTRAMUSCULAR | Status: DC | PRN
  Administered 2019-08-27: 2000 [IU] via INTRAVENOUS_CENTRAL
  Filled 2019-08-27: qty 1.6

## 2019-08-27 MED ORDER — DARBEPOETIN ALFA 100 MCG/0.5ML IJ SOSY
PREFILLED_SYRINGE | INTRAMUSCULAR | Status: AC
  Administered 2019-08-27: 100 ug via INTRAVENOUS
  Filled 2019-08-27: qty 0.5

## 2019-08-27 MED ORDER — HEPARIN SODIUM (PORCINE) 1000 UNIT/ML DIALYSIS
20.0000 [IU]/kg | INTRAMUSCULAR | Status: DC | PRN
  Administered 2019-08-27: 12:00:00 2000 [IU] via INTRAVENOUS_CENTRAL
  Filled 2019-08-27: qty 1.6

## 2019-08-27 MED ORDER — HEPARIN SODIUM (PORCINE) 1000 UNIT/ML IJ SOLN
INTRAMUSCULAR | Status: AC
  Administered 2019-08-27: 2000 [IU] via INTRAVENOUS_CENTRAL
  Filled 2019-08-27: qty 4

## 2019-08-27 NOTE — Progress Notes (Signed)
PROGRESS NOTE    DENISS WORMLEY  KGM:[id number] DOB: 09/22/1956 DOA: 08/06/2019 PCP: Martinique, Sarah T, MD   Brief Narrative: Kristen Berry is a 63 y.o. female with history of ESRD on hemodialysis on Monday Wednesday Friday, peripheral vascular disease status post stenting, history of DVT, anemia. Patient presented secondary to left great toe gangrene and found to have evidence of sepsis. She was started on empiric antibiotics. Vascular surgery consulted and patient underwent multiple procedures including bypass, hematoma evacuation, left great toe amputation.  Assessment & Plan:   Principal Problem:   Sepsis ([place]) Active Problems:   Stage 4 chronic kidney disease ([place])   Peripheral artery disease (HCC)   Coronary artery disease without angina pectoris   S/P BKA (below knee amputation), right (HCC)   Anemia in chronic kidney disease   Type 2 diabetes mellitus with diabetic neuropathy, unspecified ([place])   Sepsis Cellulitis/ostemyelitis/left great toe gangrene Patient is s/p left femoral to above knee popliteal bypass per vascular surgery with hematoma evacuation and debridement left foot. Patient managed on IV vancomycin/Zosyn -> Vancomycin/ceftaz. WBC stable. Antibiotics discontinued -Vascular surgery recommendations: watching wound at this time; hoping to avoid amputation -Trend CBC  ESRD -HD per nephrology  Secondary hyperparathyroidism Secondary to above -Per nephrology  Hyperkalemia In setting of ESRD and patient receiving potassium. Pharmacy to discuss with nephrology  Anemia of kidney disease Stable  History of bilateral DVTs -Continue home Eliquis  Chronic combined systolic and diastolic heart failure Stable. -Continue Coreg  Leukocytosis This has been stable. No evidence of worsened infection. In setting of multiple surgeries as well. Trended down slightly. -Trend CBC  PAD Patient is s/p stents of left SFA. Patient is on Brilinta as an  outpatient  Essential hypertension Better controlled overnight. Patient is on Coreg and amlodipine as an outpatient. Planned to start amlodipine which did not happen. Will hold off on further control in setting of ESRD patient on HD. -Continue Coreg  Hallucination Mentioned on chart review. Thought secondary to narcotics. Appears to have resolved. No recurrences.   DVT prophylaxis: Eliquis Code Status:   Code Status: Full Code Family Communication: None Disposition Plan: Discharge pending vascular surgery recommendations   Consultants:   Vascular surgery  Nephrology  Procedures:   11/17: Left femoral to above-knee popliteal artery bypass graft with ipsilateral translocated nonreversed saphenous vein; Exposure of left below-knee popliteal artery; Amputation of left great to an extensive debridement of skin, subtenons tissue, muscle and tendon on the left foot; Popliteal artery endarterectomy, left  11/17: Exploration of recent femoral popliteal bypass; Repair of bleeding from vein bypass; Evacuation of hematoma  11/24:  Ultrasound-guided cannulation left arm AV fistula; Left upper arm fistulogram  Antimicrobials:  Vancomycin  Ceftazidime  Zosyn    Subjective: No issues. Waiting to hear from vascular surgery  Objective: Vitals:   08/27/19 1000 08/27/19 1030 08/27/19 1100 08/27/19 1130  BP: 134/70 130/64 124/65 (!) 131/53  Pulse: 88 88 98 100  Resp:      Temp:      TempSrc:      SpO2:      Weight:      Height:        Intake/Output Summary (Last 24 hours) at 08/27/2019 1158 Last data filed at 08/26/2019 1854 Gross per 24 hour  Intake 310 ml  Output -  Net 310 ml   Filed Weights   08/24/19 0817 08/24/19 1225 08/27/19 0812  Weight: 84.2 kg 80.7 kg 81.9 kg    Examination:  General  exam: Appears calm and comfortable Respiratory system: Clear to auscultation. Respiratory effort normal. Cardiovascular system: S1 & S2 heard, RRR. No murmurs, rubs, gallops  or clicks. Gastrointestinal system: Abdomen is nondistended, soft and nontender. No organomegaly or masses felt. Normal bowel sounds heard. Central nervous system: Alert and oriented. No focal neurological deficits. Extremities: No edema. No calf tenderness. Right BKA Skin: No cyanosis. No rashes Psychiatry: Judgement and insight appear normal. Mood & affect appropriate.      Data Reviewed: I have personally reviewed following labs and imaging studies  CBC: Recent Labs  Lab 08/23/19 0246 08/24/19 0547 08/25/19 0258 08/26/19 0315 08/27/19 0311  WBC 12.8* 13.7* 11.7* 13.1* 12.7*  HGB 10.1* 10.3* 9.6* 9.6* 8.9*  HCT 32.2* 34.0* 29.9* 31.3* 28.6*  MCV 96.7 100.3* 96.5 97.8 97.9  PLT 394 430* 401* 490* 017*   Basic Metabolic Panel: Recent Labs  Lab 08/21/19 0443 08/23/19 0246 08/24/19 0547 08/26/19 0318 08/27/19 0311  NA 132* 134* 133* 130* 131*  K 3.2* 4.2 5.0 4.6 5.2*  CL 94* 97* 96* 94* 97*  CO2 24 25 22 24  20*  GLUCOSE 135* 122* 122* 161* 128*  BUN 26* 24* 35* 33* 42*  CREATININE 4.06* 3.42* 3.97* 3.38* 3.72*  CALCIUM 8.5* 8.6* 8.8* 8.4* 8.4*  PHOS  --  3.3 3.5 3.5 4.7*   GFR: Estimated Creatinine Clearance: 17.5 mL/min (A) (by C-G formula based on SCr of 3.72 mg/dL (H)). Liver Function Tests: Recent Labs  Lab 08/23/19 0246 08/24/19 0547 08/26/19 0318 08/27/19 0311  ALBUMIN 2.3* 2.5* 2.4* 2.4*   No results for input(s): LIPASE, AMYLASE in the last 168 hours. No results for input(s): AMMONIA in the last 168 hours. Coagulation Profile: No results for input(s): INR, PROTIME in the last 168 hours. Cardiac Enzymes: No results for input(s): CKTOTAL, CKMB, CKMBINDEX, TROPONINI in the last 168 hours. BNP (last 3 results) No results for input(s): PROBNP in the last 8760 hours. HbA1C: No results for input(s): HGBA1C in the last 72 hours. CBG: Recent Labs  Lab 08/26/19 0636 08/26/19 1134 08/26/19 1649 08/26/19 2129 08/27/19 0647  GLUCAP 134* 138* 149* 127*  120*   Lipid Profile: No results for input(s): CHOL, HDL, LDLCALC, TRIG, CHOLHDL, LDLDIRECT in the last 72 hours. Thyroid Function Tests: No results for input(s): TSH, T4TOTAL, FREET4, T3FREE, THYROIDAB in the last 72 hours. Anemia Panel: No results for input(s): VITAMINB12, FOLATE, FERRITIN, TIBC, IRON, RETICCTPCT in the last 72 hours. Sepsis Labs: No results for input(s): PROCALCITON, LATICACIDVEN in the last 168 hours.  No results found for this or any previous visit (from the past 240 hour(s)).       Radiology Studies: No results found.      Scheduled Meds: . apixaban  2.5 mg Oral BID  . aspirin EC  81 mg Oral Daily  . atorvastatin  20 mg Oral QHS  . calcitRIOL  0.25 mcg Oral Q M,W,F-HD  . carvedilol  6.25 mg Oral BID WC  . Chlorhexidine Gluconate Cloth  6 each Topical Q0600  . darbepoetin (ARANESP) injection - DIALYSIS  100 mcg Intravenous Q Mon-HD  . feeding supplement (NEPRO CARB STEADY)  237 mL Oral TID BM  . multivitamin  1 tablet Oral QHS  . pantoprazole  40 mg Oral Daily  . potassium chloride  20 mEq Oral Daily  . saccharomyces boulardii  250 mg Oral QHS   Continuous Infusions: . sodium chloride 10 mL/hr at 08/14/19 1115  . albumin human 25 g (08/19/19 1030)  .  cefTAZidime (FORTAZ)  IV 2 g (08/27/19 1126)     LOS: 21 days     Cordelia Poche, MD [facility] 08/27/2019, 11:58 AM  If 7PM-7AM, please contact night-coverage [email or web address]

## 2019-08-27 NOTE — Procedures (Signed)
I was present at this dialysis session. I have reviewed the session itself and made appropriate changes.   No c/o doing well. HD via [facility]. 3K bath, 3L UF goal, Qb 350.  Cont on MWF schedule.   Filed Weights   08/24/19 0817 08/24/19 1225 08/27/19 0812  Weight: 84.2 kg 80.7 kg 81.9 kg    Recent Labs  Lab 08/27/19 0311  NA 131*  K 5.2*  CL 97*  CO2 20*  GLUCOSE 128*  BUN 42*  CREATININE 3.72*  CALCIUM 8.4*  PHOS 4.7*    Recent Labs  Lab 08/25/19 0258 08/26/19 0315 08/27/19 0311  WBC 11.7* 13.1* 12.7*  HGB 9.6* 9.6* 8.9*  HCT 29.9* 31.3* 28.6*  MCV 96.5 97.8 97.9  PLT 401* 490* 462*    Scheduled Meds: . apixaban  2.5 mg Oral BID  . aspirin EC  81 mg Oral Daily  . atorvastatin  20 mg Oral QHS  . calcitRIOL  0.25 mcg Oral Q M,W,F-HD  . carvedilol  6.25 mg Oral BID WC  . Chlorhexidine Gluconate Cloth  6 each Topical Q0600  . darbepoetin (ARANESP) injection - DIALYSIS  100 mcg Intravenous Q Mon-HD  . feeding supplement (NEPRO CARB STEADY)  237 mL Oral TID BM  . multivitamin  1 tablet Oral QHS  . pantoprazole  40 mg Oral Daily  . potassium chloride  20 mEq Oral Daily  . saccharomyces boulardii  250 mg Oral QHS   Continuous Infusions: . sodium chloride 10 mL/hr at 08/14/19 1115  . albumin human 25 g (08/19/19 1030)  . cefTAZidime (FORTAZ)  IV     PRN Meds:.acetaminophen, albumin human, heparin, heparin, metoprolol tartrate, oxyCODONE, QUEtiapine   Pearson Grippe  MD 08/27/2019, 10:19 AM

## 2019-08-27 NOTE — Progress Notes (Addendum)
PT Cancellation Note  Patient Details Name: Kristen Berry MRN: [id number] DOB: 15-Dec-1955   Cancelled Treatment:    Reason Eval/Treat Not Completed: Patient at procedure or test/unavailable Pt off floor at HD. Will follow.  Attempted to see in PM but pt reports she is too tired and wiped out. Will follow.   Kristen Berry 08/27/2019, 11:26 AM Kristen Berry, PT, DPT Acute Rehabilitation Services Pager [phone] Office [phone]

## 2019-08-27 NOTE — Progress Notes (Addendum)
[facility]  Subjective  - No new complaints.  Not much pain.   Objective 123/67 84 98 F (36.7 C) (Oral) 20 99%  Intake/Output Summary (Last 24 hours) at 08/27/2019 0714 Last data filed at 08/26/2019 1854 Gross per 24 hour  Intake 510 ml  Output -  Net 510 ml    Left foot fat necrosis, tendon exposed.  Medial wound with granulation Doppler AT/peroneal  Lungs non labored breathing Gen NAD, alert  Assessment/Planning: POD # 6 Left upper arm fistulogrampending future intervention  Cont. HD via [facility] 08/14/19 #1: Left femoral to above-knee popliteal artery bypass graft with ipsilateral translocated nonreversed saphenous vein #2: Exposure of left below-knee popliteal artery #3: Amputation of left great to an extensive debridement of skin, subtenons tissue, muscle and tendon on the left foot #4: Wound VAC placement #5: Popliteal artery endarterectomy, left Return to OR same day Procedure:#1 exploration of recent femoral popliteal bypass #2 Repair of bleeding from vein bypass #3 Evacuation of hematoma  Pending Dr. Stephens Shire examination.  We are allowing the foot to demarcate. She may need further debridement verse primary BKA.   Roxy Horseman 08/27/2019 7:14 AM --  Laboratory Lab Results: Recent Labs    08/26/19 0315 08/27/19 0311  WBC 13.1* 12.7*  HGB 9.6* 8.9*  HCT 31.3* 28.6*  PLT 490* 462*   BMET Recent Labs    08/26/19 0318 08/27/19 0311  NA 130* 131*  K 4.6 5.2*  CL 94* 97*  CO2 24 20*  GLUCOSE 161* 128*  BUN 33* 42*  CREATININE 3.38* 3.72*  CALCIUM 8.4* 8.4*    COAG Lab Results  Component Value Date   INR 1.6 (H) 08/07/2019   INR 1.2 06/25/2019   INR 1.1 02/15/2019   No results found for: PTT  I agree with the above.  Have seen and evaluated the  patient.  I did her dressing change at the bedside.  She has developed a fair amount of granulation tissue since I last saw her.  She does have tendon exposed at the base of the wound and there is still some necrotic tissue up near her heel however this looks improved.  I discussed with the patient that I think she is still at risk for conversion to a below-knee amputation, however I think it is reasonable to continue with wound care as long as she is tolerating it.  Certainly discussed the possibility of healing and with an amputation on the other side, this may only function as a leg to help with transfers.  At this point, we will continue wet-to-dry dressing changes.  I told her she will likely need further debridement in the future however like for this to granulate for a longer period of time.  I think we can begin discharge planning and try to get her out of the hospital in the immediate future.  Annamarie Major

## 2019-08-28 LAB — RENAL FUNCTION PANEL
Albumin: 2.5 g/dL — ABNORMAL LOW (ref 3.5–5.0)
Anion gap: 17 — ABNORMAL HIGH (ref 5–15)
BUN: 19 mg/dL (ref 8–23)
CO2: 20 mmol/L — ABNORMAL LOW (ref 22–32)
Calcium: 8.7 mg/dL — ABNORMAL LOW (ref 8.9–10.3)
Chloride: 99 mmol/L (ref 98–111)
Creatinine, Ser: 2.43 mg/dL — ABNORMAL HIGH (ref 0.44–1.00)
GFR calc Af Amer: 24 mL/min — ABNORMAL LOW (ref >60)
GFR calc non Af Amer: 20 mL/min — ABNORMAL LOW (ref >60)
Glucose, Bld: 120 mg/dL — ABNORMAL HIGH (ref 70–99)
Phosphorus: 3.4 mg/dL (ref 2.5–4.6)
Potassium: 4.3 mmol/L (ref 3.5–5.1)
Sodium: 136 mmol/L (ref 135–145)

## 2019-08-28 LAB — SARS CORONAVIRUS 2 (TAT 6-24 HRS): SARS Coronavirus 2: NEGATIVE

## 2019-08-28 LAB — CBC
HCT: 31.1 % — ABNORMAL LOW (ref 36.0–46.0)
Hemoglobin: 9.5 g/dL — ABNORMAL LOW (ref 12.0–15.0)
MCH: 30.1 pg (ref 26.0–34.0)
MCHC: 30.5 g/dL (ref 30.0–36.0)
MCV: 98.4 fL (ref 80.0–100.0)
Platelets: 539 10*3/uL — ABNORMAL HIGH (ref 150–400)
RBC: 3.16 MIL/uL — ABNORMAL LOW (ref 3.87–5.11)
RDW: 18.4 % — ABNORMAL HIGH (ref 11.5–15.5)
WBC: 11.3 10*3/uL — ABNORMAL HIGH (ref 4.0–10.5)
nRBC: 0 % (ref 0.0–0.2)

## 2019-08-28 LAB — GLUCOSE, CAPILLARY
Glucose-Capillary: 135 mg/dL — ABNORMAL HIGH (ref 70–99)
Glucose-Capillary: 189 mg/dL — ABNORMAL HIGH (ref 70–99)
Glucose-Capillary: 131 mg/dL — ABNORMAL HIGH (ref 70–99)
Glucose-Capillary: 148 mg/dL — ABNORMAL HIGH (ref 70–99)

## 2019-08-28 MED ORDER — CARVEDILOL 12.5 MG PO TABS
12.5000 mg | ORAL_TABLET | Freq: Two times a day (BID) | ORAL | Status: DC
  Administered 2019-08-28 – 2019-08-29 (×2): 12.5 mg via ORAL
  Filled 2019-08-28 (×2): qty 1

## 2019-08-28 NOTE — Progress Notes (Signed)
PROGRESS NOTE    Kristen Berry  ZSW:[id number] DOB: 1956/04/25 DOA: 08/06/2019 PCP: [place], Sarah T, MD   Brief Narrative: Kristen Berry is a 63 y.o. female with history of ESRD on hemodialysis on Monday Wednesday Friday, peripheral vascular disease status post stenting, history of DVT, anemia. Patient presented secondary to left great toe gangrene and found to have evidence of sepsis. She was started on empiric antibiotics. Vascular surgery consulted and patient underwent multiple procedures including bypass, hematoma evacuation, left great toe amputation.  Assessment & Plan:   Principal Problem:   Sepsis ([place]) Active Problems:   Stage 4 chronic kidney disease ([place])   Peripheral artery disease (H[facility])   Coronary artery disease without angina pectoris   S/P BKA (below knee amputation), right (HCC)   Anemia in chronic kidney disease   Type 2 diabetes mellitus with diabetic neuropathy, unspecified ([place])   Sepsis Cellulitis/ostemyelitis/left great toe gangrene Patient is s/p left femoral to above knee popliteal bypass per vascular surgery with hematoma evacuation and debridement left foot. Patient managed on IV vancomycin/Zosyn -> Vancomycin/ceftaz. WBC stable. Antibiotics discontinued -Vascular surgery recommendations: watchful waiting. Hoping to avoid BKA but will likely need further debridement at a later time. Okay for discharge planning -Trend CBC  ESRD -HD per nephrology  Secondary hyperparathyroidism Secondary to above -Per nephrology  Hyperkalemia In setting of ESRD and patient receiving potassium. Pharmacy to discuss with nephrology  Anemia of kidney disease Stable  History of bilateral DVTs -Continue home Eliquis  Chronic combined systolic and diastolic heart failure Stable. -Continue Coreg  Ventricular bigeminy Seen on telemetry via personal review. -Increase to Coreg 12.5 mg BID  Leukocytosis This has been stable. No evidence of worsened infection. In  setting of multiple surgeries as well. Trended down slightly. -Trend CBC  PAD Patient is s/p stents of left SFA. Patient is on Brilinta as an outpatient  Essential hypertension Patient is on Coreg and amlodipine as an outpatient. -Continue Coreg as mention above  Hallucination Mentioned on chart review. Thought secondary to narcotics. Appears to have resolved. No recurrences.   DVT prophylaxis: Eliquis Code Status:   Code Status: Full Code Family Communication: None Disposition Plan: Medically stable for discharge. Social work consult placed for SNF placement   Consultants:   Vascular surgery  Nephrology  Procedures:   11/17: Left femoral to above-knee popliteal artery bypass graft with ipsilateral translocated nonreversed saphenous vein; Exposure of left below-knee popliteal artery; Amputation of left great to an extensive debridement of skin, subtenons tissue, muscle and tendon on the left foot; Popliteal artery endarterectomy, left  11/17: Exploration of recent femoral popliteal bypass; Repair of bleeding from vein bypass; Evacuation of hematoma  11/24:  Ultrasound-guided cannulation left arm AV fistula; Left upper arm fistulogram  Antimicrobials:  Vancomycin  Ceftazidime  Zosyn    Subjective: No issues today  Objective: Vitals:   08/28/19 0019 08/28/19 0414 08/28/19 0850 08/28/19 1012  BP: (!) 113/58 (!) 113/57 117/61   Pulse: (!) 106 (!) 107 (!) 112 (!) 116  Resp: 18 17 15    Temp: (!) 97.5 F (36.4 C) 98 F (36.7 C) 97.6 F (36.4 C)   TempSrc: Oral Oral Oral   SpO2: 100% 98% 100%   Weight:      Height:        Intake/Output Summary (Last 24 hours) at 08/28/2019 1405 Last data filed at 08/28/2019 0400 Gross per 24 hour  Intake 240 ml  Output -  Net 240 ml   [employer]  08/24/19 1225 08/27/19 0812 08/27/19 1225  Weight: 80.7 kg 81.9 kg 78.7 kg    Examination:  General exam: Appears calm and comfortable Respiratory system: Clear to  auscultation. Respiratory effort normal. Cardiovascular system: S1 & S2 heard, RRR. Gastrointestinal system: Abdomen is nondistended, soft and nontender. No organomegaly or masses felt. Normal bowel sounds heard. Central nervous system: Alert and oriented. No focal neurological deficits. Extremities: No edema. No calf tenderness. Right BKA. Left dressing intact Skin: No cyanosis. No rashes Psychiatry: Judgement and insight appear normal. Mood & affect appropriate.     Data Reviewed: I have personally reviewed following labs and imaging studies  CBC: Recent Labs  Lab 08/24/19 0547 08/25/19 0258 08/26/19 0315 08/27/19 0311 08/28/19 0304  WBC 13.7* 11.7* 13.1* 12.7* 11.3*  HGB 10.3* 9.6* 9.6* 8.9* 9.5*  HCT 34.0* 29.9* 31.3* 28.6* 31.1*  MCV 100.3* 96.5 97.8 97.9 98.4  PLT 430* 401* 490* 462* 762*   Basic Metabolic Panel: Recent Labs  Lab 08/23/19 0246 08/24/19 0547 08/26/19 0318 08/27/19 0311 08/28/19 0304  NA 134* 133* 130* 131* 136  K 4.2 5.0 4.6 5.2* 4.3  CL 97* 96* 94* 97* 99  CO2 25 22 24  20* 20*  GLUCOSE 122* 122* 161* 128* 120*  BUN 24* 35* 33* 42* 19  CREATININE 3.42* 3.97* 3.38* 3.72* 2.43*  CALCIUM 8.6* 8.8* 8.4* 8.4* 8.7*  PHOS 3.3 3.5 3.5 4.7* 3.4   GFR: Estimated Creatinine Clearance: 24.4 mL/min (A) (by C-G formula based on SCr of 2.43 mg/dL (H)). Liver Function Tests: Recent Labs  Lab 08/23/19 0246 08/24/19 0547 08/26/19 0318 08/27/19 0311 08/28/19 0304  ALBUMIN 2.3* 2.5* 2.4* 2.4* 2.5*   No results for input(s): LIPASE, AMYLASE in the last 168 hours. No results for input(s): AMMONIA in the last 168 hours. Coagulation Profile: No results for input(s): INR, PROTIME in the last 168 hours. Cardiac Enzymes: No results for input(s): CKTOTAL, CKMB, CKMBINDEX, TROPONINI in the last 168 hours. BNP (last 3 results) No results for input(s): PROBNP in the last 8760 hours. HbA1C: No results for input(s): HGBA1C in the last 72 hours. CBG: Recent Labs   Lab 08/27/19 1257 08/27/19 1625 08/27/19 2119 08/28/19 0613 08/28/19 1112  GLUCAP 116* 134* 167* 135* 189*   Lipid Profile: No results for input(s): CHOL, HDL, LDLCALC, TRIG, CHOLHDL, LDLDIRECT in the last 72 hours. Thyroid Function Tests: No results for input(s): TSH, T4TOTAL, FREET4, T3FREE, THYROIDAB in the last 72 hours. Anemia Panel: No results for input(s): VITAMINB12, FOLATE, FERRITIN, TIBC, IRON, RETICCTPCT in the last 72 hours. Sepsis Labs: No results for input(s): PROCALCITON, LATICACIDVEN in the last 168 hours.  No results found for this or any previous visit (from the past 240 hour(s)).       Radiology Studies: No results found.      Scheduled Meds: . apixaban  2.5 mg Oral BID  . aspirin EC  81 mg Oral Daily  . atorvastatin  20 mg Oral QHS  . calcitRIOL  0.25 mcg Oral Q M,W,F-HD  . carvedilol  6.25 mg Oral BID WC  . Chlorhexidine Gluconate Cloth  6 each Topical Q0600  . darbepoetin (ARANESP) injection - DIALYSIS  100 mcg Intravenous Q Mon-HD  . feeding supplement (NEPRO CARB STEADY)  237 mL Oral TID BM  . multivitamin  1 tablet Oral QHS  . pantoprazole  40 mg Oral Daily  . saccharomyces boulardii  250 mg Oral QHS   Continuous Infusions: . sodium chloride 10 mL/hr at 08/14/19 1115  .  albumin human 25 g (08/19/19 1030)  . cefTAZidime (FORTAZ)  IV Stopped (08/27/19 1156)     LOS: 22 days     Cordelia Poche, MD [facility] 08/28/2019, 2:05 PM  If 7PM-7AM, please contact night-coverage [email or web address]

## 2019-08-28 NOTE — TOC Progression Note (Addendum)
Transition of Care [facility]) - Progression Note    Patient Details  Name: Kristen Berry MRN: [id number] Date of Birth: [birth year]  Transition of Care [facility] CM/SW [place], [place] Phone Number: 08/28/2019, 3:10 PM  Clinical Narrative:     CSW visit with the patient to update and confirm discharge plan. Patient was alert and appeared happy and in good spirits. Patient remains in agreement with discharge plan to [facility]. CSW explained the SNF process and answered all questions.   Patient explained she has a ramp built by her son, [name] that is steep. She uses [facility] for transportation to and from dialysis but they are restricted in helping her up and down the ramp at her home. Patient recognize the importance of dialysis. CSW explored with the patient how she can resolve this concern. We discussed inquiring with her son, [name], neighbors, landlord, and friends. Patient states she will follow up with some of her friends and continue to think about how she can get help.   CSW contacted [facility] confirmed bed offer  CSW started insurance approval-faxed clinicals to [facility]  Patient will need covid test  Thurmond Butts, MSW, [facility] Clinical Social Worker [phone]   Expected Discharge Plan: [place] Barriers to Discharge: Continued Medical Work up  Expected Discharge Plan and Services Expected Discharge Plan: [place] arrangements for the past 2 months: Mobile Home                                       Social Determinants of Health (SDOH) Interventions    Readmission Risk Interventions No flowsheet data found.

## 2019-08-28 NOTE — Care Management Important Message (Signed)
Important Message  Patient Details  Name: Kristen Berry MRN: [id number] Date of Birth: May 08, 1956   Medicare Important Message Given:  Yes     Shelda Altes 08/28/2019, 10:58 AM

## 2019-08-28 NOTE — Progress Notes (Signed)
Progress Note    08/28/2019 7:35 AM 7 Days Post-Op  Subjective:  No complaints; says they just changed the dressing and she tolerated it just fine.  Afebrile HR 100's-110's  010'U-725'D systolic 66% RA  Vitals:   08/28/19 0019 08/28/19 0414  BP: (!) 113/58 (!) 113/57  Pulse: (!) 106 (!) 107  Resp: 18 17  Temp: (!) 97.5 F (36.4 C) 98 F (36.7 C)  SpO2: 100% 98%    Physical Exam: Cardiac:  regular Lungs:  Non labored Incisions:  All incisions look good.  Extremities:  +doppler signals left AT/peroneal   CBC    Component Value Date/Time   WBC 11.3 (H) 08/28/2019 0304   RBC 3.16 (L) 08/28/2019 0304   HGB 9.5 (L) 08/28/2019 0304   HGB 14.3 12/14/2016 1139   HCT 31.1 (L) 08/28/2019 0304   HCT 44.2 12/14/2016 1139   PLT 539 (H) 08/28/2019 0304   PLT 429 (H) 12/14/2016 1139   MCV 98.4 08/28/2019 0304   MCV 88.5 12/14/2016 1139   MCH 30.1 08/28/2019 0304   MCHC 30.5 08/28/2019 0304   RDW 18.4 (H) 08/28/2019 0304   RDW 17.6 (H) 12/14/2016 1139   LYMPHSABS 1.2 08/18/2019 0632   LYMPHSABS 1.9 12/14/2016 1139   MONOABS 2.0 (H) 08/18/2019 0632   MONOABS 0.7 12/14/2016 1139   EOSABS 0.2 08/18/2019 0632   EOSABS 0.0 10/17/2017 1430   BASOSABS 0.1 08/18/2019 0632   BASOSABS 0.1 12/14/2016 1139    BMET    Component Value Date/Time   NA 136 08/28/2019 0304   NA 137 12/14/2016 1139   K 4.3 08/28/2019 0304   K 4.2 12/14/2016 1139   CL 99 08/28/2019 0304   CO2 20 (L) 08/28/2019 0304   CO2 20 (L) 12/14/2016 1139   GLUCOSE 120 (H) 08/28/2019 0304   GLUCOSE 115 12/14/2016 1139   BUN 19 08/28/2019 0304   BUN 32.8 (H) 12/14/2016 1139   CREATININE 2.43 (H) 08/28/2019 0304   CREATININE 2.1 (H) 12/14/2016 1139   CALCIUM 8.7 (L) 08/28/2019 0304   CALCIUM 8.3 (L) 11/11/2017 0740   CALCIUM 9.5 12/14/2016 1139   GFRNONAA 20 (L) 08/28/2019 0304   GFRAA 24 (L) 08/28/2019 0304    INR    Component Value Date/Time   INR 1.6 (H) 08/07/2019 0529     Intake/Output  Summary (Last 24 hours) at 08/28/2019 0735 Last data filed at 08/28/2019 0400 Gross per 24 hour  Intake 240 ml  Output 3000 ml  Net -2760 ml     Assessment/Plan:  Dressing on left foot just changed so I did not take it down.  Continues to have left AT/peroneal doppler signal. Dr. Trula Slade saw wound yesterday and there was a fair amount of granulation tissue since he last saw the wound.  There is tendon exposed at the base and still some necrotic tissue near her heel but looked improved.  She continues to be at risk for BKA but he felt it was reasonable to continue with wound care as long as she tolerates it.  He discussed the possibility of healing and with an amputation on the other side, this may only function as a leg to help with transfers.  At this point, we will continue wet-to-dry dressing changes.  He told her she will likely need further debridement in the future however like for this to granulate for a longer period of time.  He felt that we can begin discharge planning and try to get her out of  the hospital in the immediate future.    Leontine Locket, PA-C [facility] [phone] 08/28/2019 7:35 AM

## 2019-08-28 NOTE — Progress Notes (Signed)
Physical Therapy Treatment Patient Details Name: Kristen Berry MRN: [id number] DOB: Dec 08, 1955 Today's Date: 08/28/2019    History of Present Illness 63 y.o. female with history of ESRD on hemodialysis on Monday Wednesday Friday, peripheral vascular disease status post stenting, history of DVT, anemia presents to the ER after patient has been noticing increasing swelling of the left foot increasing discharge and gangrene of the great toe noticed over the last 2 weeks. Pt underwent revascularization of LLE and L great toe amputation 11/17 and experienced LLE hematoma post-op. Pt returned to OR to evacuate hematoma and repair vein on 11/17.    PT Comments    Patient progressing slowly towards PT goals. Tolerated lateral scoot transfer to w/c with Min A and Max A of 2 for squat pivot transfer from w/c to/from toilet. Able to propel w/c independently in room but needed assist getting through narrow doorway of bathroom. HR up to 143 bpm during session. Pt motivated to get home. Will follow.    Follow Up Recommendations  SNF     Equipment Recommendations  Other (comment)(defer)    Recommendations for Other Services       Precautions / Restrictions Precautions Precautions: Fall Precaution Comments: R BKA Restrictions Weight Bearing Restrictions: Yes LLE Weight Bearing: Weight bearing as tolerated Other Position/Activity Restrictions: LLE heel WB per MD    Mobility  Bed Mobility               General bed mobility comments: Sitting EOB upon PT arrival.  Transfers Overall transfer level: Needs assistance Equipment used: None Transfers: Lateral/Scoot Transfers;Squat Pivot Transfers     Squat pivot transfers: +2 physical assistance;Max assist    Lateral/Scoot Transfers: Min assist General transfer comment: Min A to scoot into w/c from bed towards left using pad; squat pivot transfer from w/c to toilet with Max A of 2, and from toilet to w/c with Max of 2.  Ambulation/Gait                  Theme park manager mobility: Yes Wheelchair propulsion: Both upper extremities;Left lower extremity Wheelchair parts: Needs assistance Distance: 53' Wheelchair Assistance Details (indicate cue type and reason): Able to mobilize in room in w/c without difficulty but needed assist getting through doorways due to it being too narrow  Modified Rankin (Stroke Patients Only)       Balance Overall balance assessment: Needs assistance Sitting-balance support: Bilateral upper extremity supported;Feet supported(foot supported) Sitting balance-Leahy Scale: Good     Standing balance support: During functional activity Standing balance-Leahy Scale: Zero Standing balance comment: Not able to stand fully upright on 1 LE.                            Cognition Arousal/Alertness: Awake/alert Behavior During Therapy: WFL for tasks assessed/performed Overall Cognitive Status: Within Functional Limits for tasks assessed                                 General Comments: WFL for basic mobilty tasks during session.      Exercises      General Comments General comments (skin integrity, edema, etc.): HR up to 142 bpm during session.      Pertinent Vitals/Pain Pain Assessment: No/denies pain    Home Living  Prior Function            PT Goals (current goals can now be found in the care plan section) Progress towards PT goals: Progressing toward goals    Frequency    Min 2X/week      PT Plan Current plan remains appropriate    Co-evaluation              AM-PAC PT "6 Clicks" Mobility   Outcome Measure  Help needed turning from your back to your side while in a flat bed without using bedrails?: A Little Help needed moving from lying on your back to sitting on the side of a flat bed without using bedrails?: A Little Help needed moving to and  from a bed to a chair (including a wheelchair)?: Total Help needed standing up from a chair using your arms (e.g., wheelchair or bedside chair)?: Total Help needed to walk in hospital room?: Total Help needed climbing 3-5 steps with a railing? : Total 6 Click Score: 10    End of Session Equipment Utilized During Treatment: Gait belt Activity Tolerance: Patient tolerated treatment well Patient left: in chair;with call bell/phone within reach(in personal w/c) Nurse Communication: Mobility status PT Visit Diagnosis: Other abnormalities of gait and mobility (R26.89)     Time: 0930-1005 PT Time Calculation (min) (ACUTE ONLY): 35 min  Charges:  $Therapeutic Activity: 23-37 mins                     Marisa Severin, PT, DPT Acute Rehabilitation Services Pager [phone] Office [phone]       Marguarite Arbour A Sabra Heck 08/28/2019, 11:55 AM

## 2019-08-28 NOTE — Progress Notes (Addendum)
Nutrition Follow up  DOCUMENTATION CODES:   Not applicable  INTERVENTION:   D/C Nepro shakes    Add Magic cup TID with meals, each supplement provides 290 kcal and 9 grams of protein  Continue Renal MVI daily   NUTRITION DIAGNOSIS:   Increased nutrient needs related to post-op healing as evidenced by estimated needs.  Ongoing  GOAL:   Patient will meet greater than or equal to 90% of their needs   Progressing   MONITOR:   PO intake, Supplement acceptance, Weight trends, Labs, I & O's, Skin  REASON FOR ASSESSMENT:   LOS    ASSESSMENT:   Patient with PMH significant for ESRD on HD, PVD s/p stenting, CHF, CAD, HTN, and DM. Presents this admission with L foot cellulitis and osteomyelitis of great toe.   11/17- L fem to AK pop bypass with hematoma evacuation, L great toe amp, wound VAC placement  11/24- L AVF cannulation, L arm fistulogram 11/25- bedside debridement  RD working remotely.  At risk for BKA. Likely will need further debridement. Pt complains of diarrhea 15 minutes after eating anything PO. States she often can't control this and she tries to eat less because of it. MD made aware. Meal completions charted as 75-100% for last 6 meals. Like Nepro but it makes her BMs worse. Will d/c for now and provide magic cups. Phosphorus now wdl.   EDW: 80 kg  Current weight: 78.7 kg (down 5 kg over last three days)  I/O: -4,794 ml since 11/17 Last HD 11/30: 3000 ml net UF   Medications: calcitriol, aranesp, renal MVI  Labs: CBG [id number]   Diet Order:   Diet Order            Diet renal with fluid restriction Fluid restriction: 1200 mL Fluid; Room service appropriate? Yes; Fluid consistency: Thin  Diet effective now              EDUCATION NEEDS:   Not appropriate for education at this time  Skin:  Skin Assessment: Skin Integrity Issues: Skin Integrity Issues:: Other (Comment), Incisions Incisions: L groin, L leg, L great toe- amp Other: MASD-  perineum  Last BM:  12/1  Height:   Ht Readings from Last 1 Encounters:  08/08/19 5\' 9"  (1.753 m)    Weight:   Wt Readings from Last 1 Encounters:  08/27/19 78.7 kg    Ideal Body Weight:  65.9 kg  BMI:  Body mass index is 25.62 kg/m.  Estimated Nutritional Needs:   Kcal:  2000-2200 kcal  Protein:  100-120 grams  Fluid:  1000 ml + UOP   Mariana Single RD, LDN Clinical Nutrition Pager # [phone]

## 2019-08-28 NOTE — Discharge Instructions (Signed)
Information on my medicine - ELIQUIS (apixaban)  Why was Eliquis prescribed for you? Eliquis was prescribed to treat blood clots that may have been found in the veins of your legs (deep vein thrombosis) or in your lungs (pulmonary embolism) and to reduce the risk of them occurring again.  What do You need to know about Eliquis ? Your current dose is  ONE 2.5 mg tablet taken TWICE daily.  Eliquis may be taken with or without food.   Try to take the dose about the same time in the morning and in the evening. If you have difficulty swallowing the tablet whole please discuss with your pharmacist how to take the medication safely.  Take Eliquis exactly as prescribed and DO NOT stop taking Eliquis without talking to the doctor who prescribed the medication.  Stopping may increase your risk of developing a new blood clot.  Refill your prescription before you run out.  After discharge, you should have regular check-up appointments with your healthcare provider that is prescribing your Eliquis.    What do you do if you miss a dose? If a dose of ELIQUIS is not taken at the scheduled time, take it as soon as possible on the same day and twice-daily administration should be resumed. The dose should not be doubled to make up for a missed dose.  Important Safety Information A possible side effect of Eliquis is bleeding. You should call your healthcare provider right away if you experience any of the following: ? Bleeding from an injury or your nose that does not stop. ? Unusual colored urine (red or dark brown) or unusual colored stools (red or black). ? Unusual bruising for unknown reasons. ? A serious fall or if you hit your head (even if there is no bleeding).  Some medicines may interact with Eliquis and might increase your risk of bleeding or clotting while on Eliquis. To help avoid this, consult your healthcare provider or pharmacist prior to using any new prescription or non-prescription  medications, including herbals, vitamins, non-steroidal anti-inflammatory drugs (NSAIDs) and supplements.  This website has more information on Eliquis (apixaban): http://www.eliquis.com/eliquis/home  

## 2019-08-28 NOTE — Progress Notes (Signed)
[facility] Progress Note   Subjective:     No new issues,  Looking for SNF  HD Yesterday, 3L UF  Objective Vitals:   08/28/19 0019 08/28/19 0414 08/28/19 0850 08/28/19 1012  BP: (!) 113/58 (!) 113/57 117/61   Pulse: (!) 106 (!) 107 (!) 112 (!) 116  Resp: 18 17 15    Temp: (!) 97.5 F (36.4 C) 98 F (36.7 C) 97.6 F (36.4 C)   TempSrc: Oral Oral Oral   SpO2: 100% 98% 100%   Weight:      Height:       Physical Exam General:Well developed,well nourished female. Alert and in NAD Heart:RRR. No murmurs, rubs or gallops Lungs:No wheezing, rhonchi or rales auscultated Abdomen:Soft, non-tender, non-distended. +BS Extremities:2+ edema b/l lower extremities. L foot dressing present Dialysis Access:RIJ TDC, LUE AVF with +bruit  Additional Objective Labs: Basic Metabolic Panel: Recent Labs  Lab 08/26/19 0318 08/27/19 0311 08/28/19 0304  NA 130* 131* 136  K 4.6 5.2* 4.3  CL 94* 97* 99  CO2 24 20* 20*  GLUCOSE 161* 128* 120*  BUN 33* 42* 19  CREATININE 3.38* 3.72* 2.43*  CALCIUM 8.4* 8.4* 8.7*  PHOS 3.5 4.7* 3.4   Liver Function Tests: Recent Labs  Lab 08/26/19 0318 08/27/19 0311 08/28/19 0304  ALBUMIN 2.4* 2.4* 2.5*   CBC: Recent Labs  Lab 08/24/19 0547 08/25/19 0258 08/26/19 0315 08/27/19 0311 08/28/19 0304  WBC 13.7* 11.7* 13.1* 12.7* 11.3*  HGB 10.3* 9.6* 9.6* 8.9* 9.5*  HCT 34.0* 29.9* 31.3* 28.6* 31.1*  MCV 100.3* 96.5 97.8 97.9 98.4  PLT 430* 401* 490* 462* 539*   Blood Culture    Component Value Date/Time   SDES BLOOD RIGHT HAND 08/06/2019 2144   SPECREQUEST  08/06/2019 2144    BOTTLES DRAWN AEROBIC ONLY Blood Culture results may not be optimal due to an inadequate volume of blood received in culture bottles   CULT  08/06/2019 2144    NO GROWTH 5 DAYS Performed at [facility] [address], [place], [place] [address]    REPTSTATUS 08/11/2019 FINAL 08/06/2019 2144    CBG: Recent Labs  Lab 08/27/19 1625  08/27/19 2119 08/28/19 0613 08/28/19 1112 08/28/19 1609  GLUCAP 134* 167* 135* 189* 131*   Medications: . sodium chloride 10 mL/hr at 08/14/19 1115  . albumin human 25 g (08/19/19 1030)   . apixaban  2.5 mg Oral BID  . aspirin EC  81 mg Oral Daily  . atorvastatin  20 mg Oral QHS  . calcitRIOL  0.25 mcg Oral Q M,W,F-HD  . carvedilol  12.5 mg Oral BID WC  . Chlorhexidine Gluconate Cloth  6 each Topical Q0600  . darbepoetin (ARANESP) injection - DIALYSIS  100 mcg Intravenous Q Mon-HD  . multivitamin  1 tablet Oral QHS  . pantoprazole  40 mg Oral Daily  . saccharomyces boulardii  250 mg Oral QHS    Dialysis Orders: MWF [facility]  4h 350/800 80kg 2/2.25 bath TDC Heparin 2400 -LUE AVF (needs revision afterLeft foot PAD issuesresolved) calc 0.25 tiw  Assessment/Plan: 1.Left Foot Gangrene with PAD -SP L femoral to AK pop bypass+L grt toe ampon 07/1719,with subsequent hematoma evacuation L groin. Per vascular/primary.  2. ESRD - MWF via TDC. K+ 4.3 Stopped PO K. Next HD 12/2 on schedule 3.HTN/volume-BP adequately controlled now.UF 2-3L tomorrow 4. Anemia-HGBdropped requiring 2 units PRBC on 11/17, 1 unit on 11/18 and 1 unit on 11/19. Receivedaranesp 100 mcg on 11/22. Hgb stable at 9.5today.  5. Secondary hyperparathyroidism-Ca ok, continue calcitriol. Phos stable without binder.  6. AMS - multifactorial,seems to be resolved. Per primary team.  Rexene Agent  08/28/2019, 4:19 PM  [facility]

## 2019-08-29 LAB — RENAL FUNCTION PANEL
Albumin: 2.4 g/dL — ABNORMAL LOW (ref 3.5–5.0)
Anion gap: 11 (ref 5–15)
BUN: 33 mg/dL — ABNORMAL HIGH (ref 8–23)
CO2: 25 mmol/L (ref 22–32)
Calcium: 8.8 mg/dL — ABNORMAL LOW (ref 8.9–10.3)
Chloride: 98 mmol/L (ref 98–111)
Creatinine, Ser: 3.29 mg/dL — ABNORMAL HIGH (ref 0.44–1.00)
GFR calc Af Amer: 16 mL/min — ABNORMAL LOW (ref >60)
GFR calc non Af Amer: 14 mL/min — ABNORMAL LOW (ref >60)
Glucose, Bld: 148 mg/dL — ABNORMAL HIGH (ref 70–99)
Phosphorus: 4.2 mg/dL (ref 2.5–4.6)
Potassium: 4.6 mmol/L (ref 3.5–5.1)
Sodium: 134 mmol/L — ABNORMAL LOW (ref 135–145)

## 2019-08-29 LAB — CBC
HCT: 30.3 % — ABNORMAL LOW (ref 36.0–46.0)
Hemoglobin: 9.6 g/dL — ABNORMAL LOW (ref 12.0–15.0)
MCH: 30.7 pg (ref 26.0–34.0)
MCHC: 31.7 g/dL (ref 30.0–36.0)
MCV: 96.8 fL (ref 80.0–100.0)
Platelets: 538 10*3/uL — ABNORMAL HIGH (ref 150–400)
RBC: 3.13 MIL/uL — ABNORMAL LOW (ref 3.87–5.11)
RDW: 18.4 % — ABNORMAL HIGH (ref 11.5–15.5)
WBC: 12.9 10*3/uL — ABNORMAL HIGH (ref 4.0–10.5)
nRBC: 0.2 % (ref 0.0–0.2)

## 2019-08-29 LAB — GLUCOSE, CAPILLARY
Glucose-Capillary: 139 mg/dL — ABNORMAL HIGH (ref 70–99)
Glucose-Capillary: 115 mg/dL — ABNORMAL HIGH (ref 70–99)

## 2019-08-29 MED ORDER — PENTAFLUOROPROP-TETRAFLUOROETH EX AERO: 1.0000 "application " | INHALATION_SPRAY | CUTANEOUS | Status: DC | PRN

## 2019-08-29 MED ORDER — SODIUM CHLORIDE 0.9 % IV SOLN: 100.0000 mL | INTRAVENOUS | Status: DC | PRN

## 2019-08-29 MED ORDER — CARVEDILOL 12.5 MG PO TABS: 12.5000 mg | ORAL_TABLET | Freq: Two times a day (BID) | ORAL | Status: AC

## 2019-08-29 MED ORDER — LIDOCAINE HCL (PF) 1 % IJ SOLN: 5.0000 mL | INTRAMUSCULAR | Status: DC | PRN

## 2019-08-29 MED ORDER — ASPIRIN 81 MG PO TBEC: 81.0000 mg | DELAYED_RELEASE_TABLET | Freq: Every day | ORAL | Status: AC

## 2019-08-29 MED ORDER — HEPARIN SODIUM (PORCINE) 1000 UNIT/ML IJ SOLN
INTRAMUSCULAR | Status: AC
  Filled 2019-08-29: qty 3

## 2019-08-29 MED ORDER — CALCITRIOL 0.25 MCG PO CAPS: 0.2500 ug | ORAL_CAPSULE | ORAL | Status: AC

## 2019-08-29 MED ORDER — HEPARIN SODIUM (PORCINE) 1000 UNIT/ML DIALYSIS
1000.0000 [IU] | INTRAMUSCULAR | Status: DC | PRN
  Filled 2019-08-29: qty 1

## 2019-08-29 MED ORDER — ALTEPLASE 2 MG IJ SOLR: 2.0000 mg | Freq: Once | INTRAMUSCULAR | Status: DC | PRN

## 2019-08-29 MED ORDER — LIDOCAINE-PRILOCAINE 2.5-2.5 % EX CREA: 1.0000 "application " | TOPICAL_CREAM | CUTANEOUS | Status: DC | PRN

## 2019-08-29 NOTE — TOC Progression Note (Signed)
Transition of Care [facility]) - Progression Note    Patient Details  Name: Kristen Berry MRN: [id number] Date of Birth: 1956/02/09  [facility] CM/SW [place], [place] Phone Number: 08/29/2019, 1:25 PM  Clinical Narrative:     CSW received insurance authorization # [id number] 12/2-12/4. Next review date 12/4- send updated clinicals to [facility] fax # [phone].  CSW called and updated [facility] of authorization, MD and RN  Thurmond Butts, MSW, [facility] Clinical Social Worker [phone]   Expected Discharge Plan: [place] Barriers to Discharge: Continued Medical Work up  Expected Discharge Plan and Services Expected Discharge Plan: [place] arrangements for the past 2 months: Mobile Home                                       Social Determinants of Health (SDOH) Interventions    Readmission Risk Interventions No flowsheet data found.

## 2019-08-29 NOTE — Progress Notes (Signed)
Patient in a stable condition discharge education reviewed with patient she verbalized understanding, report called to nurse Neoma Laming at [facility], tele [place] notified, patient belongings at bedside ,patient awaiting PTAR for transportation to [facility]

## 2019-08-29 NOTE — Plan of Care (Signed)
  Problem: Education: Goal: Knowledge of General Education information will improve Description: Including pain rating scale, medication(s)/side effects and non-pharmacologic comfort measures Outcome: Completed/Met   Problem: Health Behavior/Discharge Planning: Goal: Ability to manage health-related needs will improve Outcome: Completed/Met   Problem: Clinical Measurements: Goal: Ability to maintain clinical measurements within normal limits will improve Outcome: Completed/Met Goal: Will remain free from infection Outcome: Completed/Met Goal: Diagnostic test results will improve Outcome: Completed/Met Goal: Respiratory complications will improve Outcome: Completed/Met Goal: Cardiovascular complication will be avoided Outcome: Completed/Met   Problem: Activity: Goal: Risk for activity intolerance will decrease Outcome: Completed/Met   Problem: Skin Integrity: Goal: Risk for impaired skin integrity will decrease Outcome: Completed/Met   Problem: Education: Goal: Knowledge of prescribed regimen will improve Outcome: Completed/Met   Problem: Activity: Goal: Ability to tolerate increased activity will improve Outcome: Completed/Met   Problem: Bowel/Gastric: Goal: Gastrointestinal status for postoperative course will improve Outcome: Completed/Met   Problem: Clinical Measurements: Goal: Postoperative complications will be avoided or minimized Outcome: Completed/Met Goal: Signs and symptoms of graft occlusion will improve Outcome: Completed/Met   Problem: Skin Integrity: Goal: Demonstration of wound healing without infection will improve Outcome: Completed/Met   Problem: Education: Goal: Knowledge of disease and its progression will improve Outcome: Completed/Met   Problem: Fluid Volume: Goal: Compliance with measures to maintain balanced fluid volume will improve Outcome: Completed/Met   Problem: Nutritional: Goal: Ability to make healthy dietary choices will  improve Outcome: Completed/Met   Problem: Clinical Measurements: Goal: Complications related to the disease process, condition or treatment will be avoided or minimized Outcome: Completed/Met

## 2019-08-29 NOTE — Progress Notes (Signed)
Orthopedic Tech Progress Note Patient Details:  Manaal [facility] Aug 10, 1956 [id number]  Ortho Devices Type of Ortho Device: Darco shoe Ortho Device/Splint Location: LLE Ortho Device/Splint Interventions: Adjustment, Ordered   Post Interventions Patient Tolerated: Well Instructions Provided: Care of device, Adjustment of device   Janit Pagan 08/29/2019, 8:59 AM

## 2019-08-29 NOTE — Progress Notes (Signed)
[facility] Progress Note   Subjective:     No new issues,  For HD today  Objective Vitals:   08/28/19 1833 08/28/19 1928 08/29/19 0504 08/29/19 0754  BP:  (!) 124/49 137/62 (!) 117/55  Pulse: (!) 102 82 100   Resp:  17 16 20   Temp:  98.3 F (36.8 C) 98.2 F (36.8 C) 98.4 F (36.9 C)  TempSrc:  Oral Oral Oral  SpO2:  99% 98% 100%  Weight:      Height:       Physical Exam General:Well developed,well nourished female. Alert and in NAD Heart:RRR. No murmurs, rubs or gallops Lungs:No wheezing, rhonchi or rales auscultated Abdomen:Soft, non-tender, non-distended. +BS Extremities:2+ edema b/l lower extremities. L foot dressing present Dialysis Access:RIJ TDC, LUE AVF with +bruit  Additional Objective Labs: Basic Metabolic Panel: Recent Labs  Lab 08/27/19 0311 08/28/19 0304 08/29/19 0224  NA 131* 136 134*  K 5.2* 4.3 4.6  CL 97* 99 98  CO2 20* 20* 25  GLUCOSE 128* 120* 148*  BUN 42* 19 33*  CREATININE 3.72* 2.43* 3.29*  CALCIUM 8.4* 8.7* 8.8*  PHOS 4.7* 3.4 4.2   Liver Function Tests: Recent Labs  Lab 08/27/19 0311 08/28/19 0304 08/29/19 0224  ALBUMIN 2.4* 2.5* 2.4*   CBC: Recent Labs  Lab 08/25/19 0258 08/26/19 0315 08/27/19 0311 08/28/19 0304 08/29/19 0224  WBC 11.7* 13.1* 12.7* 11.3* 12.9*  HGB 9.6* 9.6* 8.9* 9.5* 9.6*  HCT 29.9* 31.3* 28.6* 31.1* 30.3*  MCV 96.5 97.8 97.9 98.4 96.8  PLT 401* 490* 462* 539* 538*   Blood Culture    Component Value Date/Time   SDES BLOOD RIGHT HAND 08/06/2019 2144   SPECREQUEST  08/06/2019 2144    BOTTLES DRAWN AEROBIC ONLY Blood Culture results may not be optimal due to an inadequate volume of blood received in culture bottles   CULT  08/06/2019 2144    NO GROWTH 5 DAYS Performed at [facility] [address], [place], [place] [address]    REPTSTATUS 08/11/2019 FINAL 08/06/2019 2144    CBG: Recent Labs  Lab 08/28/19 1112 08/28/19 1609 08/28/19 2056 08/29/19 0626  08/29/19 1114  GLUCAP 189* 131* 148* 139* 115*   Medications: . sodium chloride 10 mL/hr at 08/14/19 1115  . albumin human 25 g (08/19/19 1030)   . apixaban  2.5 mg Oral BID  . aspirin EC  81 mg Oral Daily  . atorvastatin  20 mg Oral QHS  . calcitRIOL  0.25 mcg Oral Q M,W,F-HD  . carvedilol  12.5 mg Oral BID WC  . Chlorhexidine Gluconate Cloth  6 each Topical Q0600  . darbepoetin (ARANESP) injection - DIALYSIS  100 mcg Intravenous Q Mon-HD  . multivitamin  1 tablet Oral QHS  . pantoprazole  40 mg Oral Daily  . saccharomyces boulardii  250 mg Oral QHS    Dialysis Orders: MWF [facility]  4h 350/800 80kg 2/2.25 bath TDC Heparin 2400 -LUE AVF (needs revision afterLeft foot PAD issuesresolved) calc 0.25 tiw  Assessment/Plan: 1.Left Foot Gangrene with PAD -SP L femoral to AK pop bypass+L grt toe ampon 07/1719,with subsequent hematoma evacuation L groin. Per vascular/primary.  2. ESRD - MWF via TDC. K+ 4.3 Stopped PO K. Next HD 12/2 on schedule 3.HTN/volume-BP adequately controlled now.UF 2-3L tomorrow 4. Anemia-HGBdropped requiring 2 units PRBC on 11/17, 1 unit on 11/18 and 1 unit on 11/19. Receivedaranesp 100 mcg on 11/22. Hgb stable at 9.5today.  5. Secondary hyperparathyroidism-Ca ok, continue calcitriol. Phos stable  without binder.  6. AMS - multifactorial,seems to be resolved. Per primary team.  Rexene Agent  08/29/2019, 11:25 AM  [facility]

## 2019-08-29 NOTE — TOC Transition Note (Signed)
Transition of Care [facility]) - CM/SW Discharge Note   Patient Details  Name: JACKLYNE BAIK MRN: [id number] Date of Birth: Apr 22, 1956  Transition of Care [facility]) CM/SW Contact:  Vinie Sill, [place] Phone Number: 08/29/2019, 5:23 PM   Clinical Narrative:     Patient will DC to: [facility] Date: 08/29/2019 Family Notified:[name], daughter - unable to VM for patient's son, Chris  Transport FX:[id number]  RN, patient, and facility notified of DC. Discharge Summary sent to facility. RN given number for [phone], Room 218. Ambulance transport requested for patient.   Clinical Social Worker signing off. Thurmond Butts, MSW, [facility] Clinical Social Worker [phone]    Final next level of care: Skilled Nursing Facility Barriers to Discharge: Barriers Resolved   Patient Goals and CMS Choice        Discharge Placement PASRR number recieved: 08/13/19            Patient chooses bed at: ([facility]) Patient to be transferred to facility by: [place] Name of family member notified: [name], daughter    Discharge Plan and Services                                     Social Determinants of Health (SDOH) Interventions     Readmission Risk Interventions No flowsheet data found.

## 2019-08-29 NOTE — Discharge Summary (Signed)
Physician Discharge Summary  Kristen Berry WNU:[id number] DOB: 20-Jan-1956 DOA: 08/06/2019  PCP: [place], Sarah T, MD  Admit date: 08/06/2019 Discharge date: 08/29/2019  Time spent: 40 minutes  Recommendations for Outpatient Follow-up:  1. Needs renal panel CBC 1 week 2. Requires outpatient follow-up with Kristen Berry for wound check wet-to-dry dressings twice daily and Darco shoe for heel weightbearing only per BBS 3. Brilinta stopped this admission continue other meds 4. Pain control Tylenol  5. May need fistulogram versus procedure for permanent access resolution-discharging with tunneled catheter in right chest for dialysis with  Discharge Diagnoses:  Principal Problem:   Sepsis (Kristen Berry) Active Problems:   Stage 4 chronic kidney disease ([name])   Peripheral artery disease ([name])   Coronary artery disease without angina pectoris   S/P BKA (below knee amputation), right ([name])   Anemia in chronic kidney disease   Type 2 diabetes mellitus with diabetic neuropathy, unspecified ([name])   Discharge Condition: Improved  Diet recommendation: Renal  Filed Weights   08/24/19 1225 08/27/19 0812 08/27/19 1225  Weight: 80.7 kg 81.9 kg 78.7 kg    History of present illness:  63 year old Known peripheral vascular disease with multiple episodes of cellulitis and amputations-critical left leg ischemia status post angioplasty left SFA plus stent ESRD since 11/16/2017  TTS [place] ? MGUS Systolic + diastolic heart failure EF 35% Previous bilateral lower extremity DVTs 2019 HTN Prior C. difficile colitis/2019  Admitted with worsening left great toe gangrene Vascular, Ortho, renal consulted  Hospital Course:  Sepsis secondary to cellulitis osteomyelitis and left great toe gangrene Status post left femoral to AK pop bypass with hematoma evacuation + debridement skin left foot Was on vancomycin and Zyvox with dialysis and these were discontinued on 11/30 and patient did not have any fever or  chills Wounds reviewed by vascular 12/3 they recommend wet-to-dry dressings and above Acute blood loss anemia from hematoma evacuation-blood loss 600 cc? Given 1 unit of PRBC--recheck labs a.m.--Rec'd 1 u PRBC 11/18 ,  11/19 1 U prbc again--hemoglobin stable in 10 range now Heparin changed to apixaban  continue aspirin Added discussion with Kristen Berry earlier in the hospital admission regarding possible need for Brilinta and they say that evidence is poor for the same so this was discontinued on discharge ESRD TTS Anemia of renal disease Secondary hyperparathyroidism phosphorus 5.1 fistulogram 11/24 showed not working-may need replacement once infection completely Rx-may be as outpatient? Continue PhosLo 3 times daily, Rocaltrol 0.25 MWF Previous bilateral lower extremity DVTs See above discussions Systolic, diastolic heart failure EF 35% moderate mitral regurg PAP 55 HTN Medications adjusted this admission-some hypotension so cut back number of meds may need follow-up in the outpatient setting Reflux  continue pantoprazole 40 daily Hallucinations Secondary to sundowning in addition to opiates Seroquel was started for exacerbations but subsequently discontinued We will take Tylenol as her pain is well controlled  Procedures:  Multiple  Consultations:  Renal, vascular  Discharge Exam: Vitals:   08/29/19 0754 08/29/19 1320  BP: (!) 117/55 (!) 122/50  Pulse:  88  Resp: 20 16  Temp: 98.4 F (36.9 C) 98.3 F (36.8 C)  SpO2: 100%     General: Awake alert coherent seen on HD unit doing well coherent much clearer than last time I saw her Cardiovascular: S1-S2 no murmur rub or gallop with right chest tunneled access Respiratory: Clinically clear no rales no rhonchi Abdomen soft no rebound no guarding  Discharge Instructions   Discharge Instructions    Diet - low sodium  heart healthy   Complete by: As directed    Increase activity slowly   Complete by: As directed       Allergies as of 08/29/2019      Reactions   Crestor [rosuvastatin Calcium] Other (See Comments)   Leg pain      Medication List    STOP taking these medications   amLODipine 5 MG tablet Commonly known as: NORVASC   calcium acetate 667 MG capsule Commonly known as: PHOSLO   diphenoxylate-atropine 2.5-0.025 MG tablet Commonly known as: LOMOTIL   hydrALAZINE 25 MG tablet Commonly known as: APRESOLINE   HYDROcodone-acetaminophen 5-325 MG tablet Commonly known as: Norco   ticagrelor 90 MG Tabs tablet Commonly known as: BRILINTA     TAKE these medications   acetaminophen 500 MG tablet Commonly known as: TYLENOL Take 2,000 mg by mouth every 4 (four) hours as needed (pain).   apixaban 2.5 MG Tabs tablet Commonly known as: ELIQUIS Take 1 tablet (2.5 mg total) by mouth 2 (two) times daily.   aspirin 81 MG EC tablet Take 1 tablet (81 mg total) by mouth daily. Start taking on: August 30, 2019   atorvastatin 20 MG tablet Commonly known as: LIPITOR Take 1 tablet (20 mg total) by mouth daily at 6 PM. What changed: when to take this   calcitRIOL 0.25 MCG capsule Commonly known as: ROCALTROL Take 1 capsule (0.25 mcg total) by mouth every Monday, Wednesday, and Friday with hemodialysis. Start taking on: August 31, 2019   carvedilol 12.5 MG tablet Commonly known as: COREG Take 1 tablet (12.5 mg total) by mouth 2 (two) times daily with a meal. What changed:   medication strength  how much to take  when to take this   clotrimazole-betamethasone cream Commonly known as: LOTRISONE Apply 1 application topically daily as needed (rash).   Melatonin 5 MG Caps Take 5 mg by mouth at bedtime.   pantoprazole 40 MG tablet Commonly known as: PROTONIX Take 40 mg by mouth at bedtime.   pentafluoroprop-tetrafluoroeth Aero Commonly known as: GEBAUERS Apply 1 application topically as needed (topical anesthesia for hemodialysis).   polyethylene glycol 17 g packet Commonly  known as: MIRALAX / GLYCOLAX Take 17 g by mouth daily as needed for mild constipation.   ProAir HFA 108 (90 Base) MCG/ACT inhaler Generic drug: albuterol Inhale 2 puffs into the lungs every 6 (six) hours as needed for wheezing or shortness of breath.   saccharomyces boulardii 250 MG capsule Commonly known as: FLORASTOR Take 1 capsule (250 mg total) by mouth 2 (two) times daily. What changed: when to take this      Allergies  Allergen Reactions  . Crestor [Rosuvastatin Calcium] Other (See Comments)    Leg pain   Follow-up Information    Serafina Mitchell, MD In 2 weeks.   Specialties: Vascular Surgery, Cardiology Why: Office will call you to arrange your appt (sent) Contact information: [place] [address] [phone]            The results of significant diagnostics from this hospitalization (including imaging, microbiology, ancillary and laboratory) are listed below for reference.    Significant Diagnostic Studies: Dg Chest 2 View  Result Date: 08/06/2019 CLINICAL DATA:  Shortness of breath.  Missed dialysis. EXAM: CHEST - 2 VIEW COMPARISON:  Feb 15, 2019 FINDINGS: There is a well-positioned tunneled dialysis catheter. There is cardiomegaly with vascular congestion and early pulmonary edema. No large pleural effusion. There is no focal infiltrate. No acute osseous  abnormality. IMPRESSION: 1. Cardiomegaly with vascular congestion and possible early edema. 2. Well-positioned tunneled dialysis catheter. Electronically Signed   By: Constance Holster M.D.   On: 08/06/2019 22:18   Dg Chest Port 1 View  Result Date: 08/18/2019 CLINICAL DATA:  Pneumonia EXAM: PORTABLE CHEST 1 VIEW COMPARISON:  Chest radiograph dated 08/06/2019 FINDINGS: A right internal jugular central venous catheter tip overlies the right atrium. The heart remains mildly enlarged. Vascular calcifications are seen in the aortic arch. There are diffuse bilateral interstitial opacities which are  not significantly changed. There is no pleural effusion or pneumothorax. IMPRESSION: 1. No significant interval change in diffuse bilateral interstitial opacities which may reflect pulmonary edema or atypical pneumonia. Electronically Signed   By: Zerita Boers M.D.   On: 08/18/2019 10:05   Dg Foot Complete Left  Result Date: 08/06/2019 CLINICAL DATA:  Acute on chronic pain in left great toe. Chronic wound with positive erythema and warmth. EXAM: LEFT FOOT - COMPLETE 3+ VIEW COMPARISON:  06/17/2019 FINDINGS: Findings are consistent with osteomyelitis involving the proximal phalanx of the first digit. There is subtle lucencies involving the medial aspect of the first metatarsal head. Pockets of subcutaneous gas and soft tissue swelling is noted. There is a lucency involving the tuft of the distal phalanx of the first digit. The patient is status post amputation of the second and third digits as before. There is a large plantar calcaneal spur. There is an Achilles tendon enthesophyte. IMPRESSION: 1. Overall findings consistent with osteomyelitis of the proximal and distal phalanges of the first digit. 2. Questionable osteomyelitis involving the head of the first metatarsal. This can be further evaluated with a contrast enhanced MRI. 3. Soft tissue infection with multiple pockets of subcutaneous gas. Electronically Signed   By: Constance Holster M.D.   On: 08/06/2019 22:16   Vas [place] Burnard Bunting With/wo Tbi  Result Date: 08/17/2019 LOWER EXTREMITY DOPPLER STUDY Indications: Peripheral artery disease.  Vascular Interventions: 08/14/2019 - LEFT FEMORAL-POPLITEAL ARTERY Bypass Graft                         AMPUTATION LEFT GREAT TOE                         Application Of Wound Vac to left foot. Limitations: Today's exam was limited due to patient intolerant to cuff pressure              and Right BKA, Wound vac on left foot. Comparison Study: 06/26/2019 - R BKA L 0.29 Performing Technologist: Carlos Levering Rvt  Examination  Guidelines: A complete evaluation includes at minimum, Doppler waveform signals and systolic blood pressure reading at the level of bilateral brachial, anterior tibial, and posterior tibial arteries, when vessel segments are accessible. Bilateral testing is considered an integral part of a complete examination. Photoelectric Plethysmograph (PPG) waveforms and toe systolic pressure readings are included as required and additional duplex testing as needed. Limited examinations for reoccurring indications may be performed as noted.  ABI Findings: +--------+------------------+-----+--------+--------+ Right   Rt Pressure (mmHg)IndexWaveformComment  +--------+------------------+-----+--------+--------+ Brachial99                     biphasic         +--------+------------------+-----+--------+--------+ PTA  BKA      +--------+------------------+-----+--------+--------+ DP                                     BKA      +--------+------------------+-----+--------+--------+ +--------+------------------+-----+----------+--------------+ Left    Lt Pressure (mmHg)IndexWaveform  Comment        +--------+------------------+-----+----------+--------------+ Brachial                                 Restricted arm +--------+------------------+-----+----------+--------------+ PTA     53                0.54 biphasic                 +--------+------------------+-----+----------+--------------+ DP      61                0.62 monophasic               +--------+------------------+-----+----------+--------------+ +-------+-----------+-----------+------------+------------+ ABI/TBIToday's ABIToday's TBIPrevious ABIPrevious TBI +-------+-----------+-----------+------------+------------+ Left   0.62                  0.29                     +-------+-----------+-----------+------------+------------+  Summary: Right: Unable to obtain pressures and  waveforms due to BKA. Left: Resting left ankle-brachial index indicates moderate left lower extremity arterial disease.  *See table(s) above for measurements and observations.  Electronically signed by Servando Snare MD on 08/17/2019 at 5:44:36 PM.    Final    Vas [place] Lower Extremity Saphenous Vein Mapping  Result Date: 08/09/2019 LOWER EXTREMITY VEIN MAPPING Indications: pre op bypass  Performing Technologist: June Leap RDMS, RVT  Examination Guidelines: A complete evaluation includes B-mode imaging, spectral Doppler, color Doppler, and power Doppler as needed of all accessible portions of each vessel. Bilateral testing is considered an integral part of a complete examination. Limited examinations for reoccurring indications may be performed as noted. +---------------+-----------+----------------------+---------------+-----------+   RT Diameter  RT Findings         GSV            LT Diameter  LT Findings      (cm)                                            (cm)                  +---------------+-----------+----------------------+---------------+-----------+                               Saphenofemoral         0.71                                                   Junction                                  +---------------+-----------+----------------------+---------------+-----------+  Proximal thigh         0.66                  +---------------+-----------+----------------------+---------------+-----------+                                 Mid thigh            0.60       branching  +---------------+-----------+----------------------+---------------+-----------+                                Distal thigh          0.55                  +---------------+-----------+----------------------+---------------+-----------+                                    Knee              0.66                   +---------------+-----------+----------------------+---------------+-----------+                                 Prox calf            0.62                  +---------------+-----------+----------------------+---------------+-----------+                                  Mid calf            0.47       branching  +---------------+-----------+----------------------+---------------+-----------+                                Distal calf           0.45                  +---------------+-----------+----------------------+---------------+-----------+ Diagnosing physician: Servando Snare MD Electronically signed by Servando Snare MD on 08/09/2019 at 3:31:06 PM.    Final     Microbiology: Recent Results (from the past 240 hour(s))  SARS CORONAVIRUS 2 (TAT 6-24 HRS) Nasopharyngeal Nasopharyngeal Swab     Status: None   Collection Time: 08/28/19  5:05 PM   Specimen: Nasopharyngeal Swab  Result Value Ref Range Status   SARS Coronavirus 2 NEGATIVE NEGATIVE Final    Comment: (NOTE) SARS-CoV-2 target nucleic acids are NOT DETECTED. The SARS-CoV-2 RNA is generally detectable in upper and lower respiratory specimens during the acute phase of infection. Negative results do not preclude SARS-CoV-2 infection, do not rule out co-infections with other pathogens, and should not be used as the sole basis for treatment or other patient management decisions. Negative results must be combined with clinical observations, patient history, and epidemiological information. The expected result is Negative. Fact Sheet for Patients: SugarRoll.be Fact Sheet for Healthcare Providers: https://www.woods-mathews.com/ This test is not yet approved or cleared by the [place] FDA and  has been authorized for detection and/or diagnosis of SARS-CoV-2 by FDA under an Emergency Use Authorization (EUA). This EUA will remain  in effect (  meaning this test can be used) for the  duration of the COVID-19 declaration under Section 56 4(b)(1) of the Act, 21 U.S.C. section 360bbb-3(b)(1), unless the authorization is terminated or revoked sooner. Performed at [facility], [place] [address] [place], [place] [address]      Labs: Basic Metabolic Panel: Recent Labs  Lab 08/24/19 0547 08/26/19 0318 08/27/19 0311 08/28/19 0304 08/29/19 0224  NA 133* 130* 131* 136 134*  K 5.0 4.6 5.2* 4.3 4.6  CL 96* 94* 97* 99 98  CO2 22 24 20* 20* 25  GLUCOSE 122* 161* 128* 120* 148*  BUN 35* 33* 42* 19 33*  CREATININE 3.97* 3.38* 3.72* 2.43* 3.29*  CALCIUM 8.8* 8.4* 8.4* 8.7* 8.8*  PHOS 3.5 3.5 4.7* 3.4 4.2   Liver Function Tests: Recent Labs  Lab 08/24/19 0547 08/26/19 0318 08/27/19 0311 08/28/19 0304 08/29/19 0224  ALBUMIN 2.5* 2.4* 2.4* 2.5* 2.4*   No results for input(s): LIPASE, AMYLASE in the last 168 hours. No results for input(s): AMMONIA in the last 168 hours. CBC: Recent Labs  Lab 08/25/19 0258 08/26/19 0315 08/27/19 0311 08/28/19 0304 08/29/19 0224  WBC 11.7* 13.1* 12.7* 11.3* 12.9*  HGB 9.6* 9.6* 8.9* 9.5* 9.6*  HCT 29.9* 31.3* 28.6* 31.1* 30.3*  MCV 96.5 97.8 97.9 98.4 96.8  PLT 401* 490* 462* 539* 538*   Cardiac Enzymes: No results for input(s): CKTOTAL, CKMB, CKMBINDEX, TROPONINI in the last 168 hours. BNP: BNP (last 3 results) No results for input(s): BNP in the last 8760 hours.  ProBNP (last 3 results) No results for input(s): PROBNP in the last 8760 hours.  CBG: Recent Labs  Lab 08/28/19 1112 08/28/19 1609 08/28/19 2056 08/29/19 0626 08/29/19 1114  GLUCAP 189* 131* 148* 139* 115*       Signed:  Nita Sells MD   [facility] 08/29/2019, 2:21 PM

## 2019-08-29 NOTE — Progress Notes (Addendum)
  Progress Note    08/29/2019 8:35 AM 8 Days Post-Op  Subjective:  No complaints; dressing just changed  afebrile  Vitals:   08/29/19 0504 08/29/19 0754  BP: 137/62 (!) 117/55  Pulse: 100   Resp: 16 20  Temp: 98.2 F (36.8 C) 98.4 F (36.9 C)  SpO2: 98% 100%    Physical Exam: General:  Looks good sitting up on side of bed.  No distress Lungs:  Non labored Incisions:  Dressing in place-it was just changed   CBC    Component Value Date/Time   WBC 12.9 (H) 08/29/2019 0224   RBC 3.13 (L) 08/29/2019 0224   HGB 9.6 (L) 08/29/2019 0224   HGB 14.3 12/14/2016 1139   HCT 30.3 (L) 08/29/2019 0224   HCT 44.2 12/14/2016 1139   PLT 538 (H) 08/29/2019 0224   PLT 429 (H) 12/14/2016 1139   MCV 96.8 08/29/2019 0224   MCV 88.5 12/14/2016 1139   MCH 30.7 08/29/2019 0224   MCHC 31.7 08/29/2019 0224   RDW 18.4 (H) 08/29/2019 0224   RDW 17.6 (H) 12/14/2016 1139   LYMPHSABS 1.2 08/18/2019 0632   LYMPHSABS 1.9 12/14/2016 1139   MONOABS 2.0 (H) 08/18/2019 0632   MONOABS 0.7 12/14/2016 1139   EOSABS 0.2 08/18/2019 0632   EOSABS 0.0 10/17/2017 1430   BASOSABS 0.1 08/18/2019 0632   BASOSABS 0.1 12/14/2016 1139    BMET    Component Value Date/Time   NA 134 (L) 08/29/2019 0224   NA 137 12/14/2016 1139   K 4.6 08/29/2019 0224   K 4.2 12/14/2016 1139   CL 98 08/29/2019 0224   CO2 25 08/29/2019 0224   CO2 20 (L) 12/14/2016 1139   GLUCOSE 148 (H) 08/29/2019 0224   GLUCOSE 115 12/14/2016 1139   BUN 33 (H) 08/29/2019 0224   BUN 32.8 (H) 12/14/2016 1139   CREATININE 3.29 (H) 08/29/2019 0224   CREATININE 2.1 (H) 12/14/2016 1139   CALCIUM 8.8 (L) 08/29/2019 0224   CALCIUM 8.3 (L) 11/11/2017 0740   CALCIUM 9.5 12/14/2016 1139   GFRNONAA 14 (L) 08/29/2019 0224   GFRAA 16 (L) 08/29/2019 0224    INR    Component Value Date/Time   INR 1.6 (H) 08/07/2019 0529     Intake/Output Summary (Last 24 hours) at 08/29/2019 0835 Last data filed at 08/29/2019 0755 Gross per 24 hour   Intake 640 ml  Output -  Net 640 ml     Plan:  Okay for discharge from vascular standpoint.  Dr. Trula Slade will see pt back in a couple of weeks to check the wound.  Wet to dry dressing changes bid.  Will order Darco shoe for heel weight bearing only   Leontine Locket, PA-C [facility] [phone] 08/29/2019 8:35 AM  I have seen and evaluated the patient and agree with the above assessment and plan.  Annamarie Major

## 2019-09-06 ENCOUNTER — Telehealth: Payer: Self-pay | Admitting: *Deleted

## 2019-09-06 NOTE — Progress Notes (Signed)
POST OPERATIVE OFFICE NOTE    CC:  F/u for surgery  HPI:  This is a 63 y.o. female who is s/p left femoral to AK popliteal bypass with ipsilateral translocated non reversed GSV on 08/14/2019.  She was taken back to the OR later that day for exploration and repair of bleeding bypass and evacuation of hematoma also by Dr. Trula Slade.  At discharge from the hospital, she was to continue wet to dry dressing changes and use a darco shoe for heel weight bearing only.  She has hx of right BKA by Dr. Sharol Given in April 2020.    She presents today and for follow up.  She looks good today.  She states that they have put her on some abx b/c her upper leg incisions had some pus.  She states she thinks her foot is doing ok.  She states she has 10 more days in rehab.    On 08/21/2019, she underwent a fistulogram on the left upper arm fistula. There was a tight stenosis at the AV anastomosis that was ~ 80%.  It had failed balloon dilation in the past and Dr. Donzetta Matters elected not to intervent and will need open revision once her foot declares itself.  She has hx of superficialization and branch ligation of the left BC AVF on 02/15/2019 and angioplasty of the AV anastomosis on 05/22/2019 by Dr. Donzetta Matters.  Allergies  Allergen Reactions  . Crestor [Rosuvastatin Calcium] Other (See Comments)    Leg pain    Current Outpatient Medications  Medication Sig Dispense Refill  . acetaminophen (TYLENOL) 500 MG tablet Take 2,000 mg by mouth every 4 (four) hours as needed (pain).     [name] albuterol (PROAIR HFA) 108 (90 Base) MCG/ACT inhaler Inhale 2 puffs into the lungs every 6 (six) hours as needed for wheezing or shortness of breath.     [name] apixaban (ELIQUIS) 2.5 MG TABS tablet Take 1 tablet (2.5 mg total) by mouth 2 (two) times daily. 60 tablet   . aspirin EC 81 MG EC tablet Take 1 tablet (81 mg total) by mouth daily.    [name] atorvastatin (LIPITOR) 20 MG tablet Take 1 tablet (20 mg total) by mouth daily at 6 PM. (Patient taking differently:  Take 20 mg by mouth at bedtime. ) 30 tablet 0  . calcitRIOL (ROCALTROL) 0.25 MCG capsule Take 1 capsule (0.25 mcg total) by mouth every Monday, Wednesday, and Friday with hemodialysis.    [name] carvedilol (COREG) 12.5 MG tablet Take 1 tablet (12.5 mg total) by mouth 2 (two) times daily with a meal.    . clotrimazole-betamethasone (LOTRISONE) cream Apply 1 application topically daily as needed (rash).   0  . Melatonin 5 MG CAPS Take 5 mg by mouth at bedtime.    . pantoprazole (PROTONIX) 40 MG tablet Take 40 mg by mouth at bedtime.     . pentafluoroprop-tetrafluoroeth (GEBAUERS) AERO Apply 1 application topically as needed (topical anesthesia for hemodialysis). (Patient not taking: Reported on 08/06/2019)  0  . polyethylene glycol (MIRALAX / GLYCOLAX) packet Take 17 g by mouth daily as needed for mild constipation. (Patient not taking: Reported on 08/06/2019) 14 each 0  . saccharomyces boulardii (FLORASTOR) 250 MG capsule Take 1 capsule (250 mg total) by mouth 2 (two) times daily. (Patient taking differently: Take 250 mg by mouth at bedtime. ) 20 capsule 0   No current facility-administered medications for this visit.     ROS:  See HPI  Physical Exam:  Today's Vitals  09/07/19 1135  BP: 123/70  Pulse: (!) 108  Resp: 16  Temp: (!) 97.3 F (36.3 C)  TempSrc: Temporal  SpO2: 100%  Weight: 167 lb (75.8 kg)  Height: 5\' 8"  (1.727 m)   Body mass index is 25.39 kg/m.   Incision:  Left groin and left thigh incisions look ok.  There is mild erythema around the incisions.  Minimal drainage.      Extremities:  Brisk doppler signal left DP/PT   Assessment/Plan:  This is a 63 y.o. female who is s/p: left femoral to AK popliteal bypass with ipsilateral translocated non reversed GSV on 08/14/2019.  She was taken back to the OR later that day for exploration and repair of bleeding bypass and evacuation of hematoma also by Dr. Trula Slade.  -pt doing well with brisk doppler signals left foot.   -her  left groin and upper thigh incisions are okay. I do not appreciate any drainage of her incisions today.   Would continue abx that has been given for its course.  She has GSV for bypass and no synthetic material present.  She has not had any fevers.  I do not know which abx she is on as she did not come with any paperwork.   -Dr. Donzetta Matters also examined her left foot amputation site and it looks pretty good.  There is some fibrinous tissue present.  Feel that we can go back to the wound vac with M/W/F vac change and see Dr. Trula Slade back in 3-4 weeks to re-evaluate to see if she needs further debridement.  He will also need to discuss HD access with her at the time of that visit.  -she will call sooner if there are any issues or changes.     Leontine Locket, PA-C [facility] [phone]  Clinic MD:  Pt seen and examined with Dr. Donzetta Matters

## 2019-09-06 NOTE — Telephone Encounter (Signed)
Call from Neoma Laming at [facility] requesting patient to be seen for wound check. States antibiotic started 09/05/2019. Abdominal and thigh incisions are warm with some purulent drainage. Thigh incision edges de hissed. Her opinion is that patient does not need to go to ER. No fever or chills. Appointment given to see PA Friday.

## 2019-09-07 ENCOUNTER — Other Ambulatory Visit: Payer: Self-pay

## 2019-09-07 ENCOUNTER — Ambulatory Visit (INDEPENDENT_AMBULATORY_CARE_PROVIDER_SITE_OTHER): Payer: Self-pay | Admitting: Physician Assistant

## 2019-09-07 VITALS — BP 123/70 | HR 108 | Temp 97.3°F | Resp 16 | Ht 68.0 in | Wt 167.0 lb

## 2019-09-07 DIAGNOSIS — Z95828 Presence of other vascular implants and grafts: Secondary | ICD-10-CM

## 2019-09-07 DIAGNOSIS — I739 Peripheral vascular disease, unspecified: Secondary | ICD-10-CM

## 2019-09-10 ENCOUNTER — Encounter: Payer: Medicare HMO | Admitting: Surgery

## 2019-09-10 ENCOUNTER — Telehealth: Payer: Self-pay | Admitting: Surgery

## 2019-09-10 NOTE — Telephone Encounter (Signed)
I called patient to find out which [employer] she was using to order her Wound Vac. Per patient's son [name], she is still in the nursing home after "having other toe amputations and is using a Wound Vac already".  Thurston Hole., LPN

## 2019-09-18 ENCOUNTER — Telehealth: Payer: Self-pay | Admitting: Surgery

## 2019-09-18 NOTE — Telephone Encounter (Signed)
Helene Kelp at [facility] called.  [employer] is coming to pick patient's Wound Vac up this Thursday since her insurance does not qualify.  Olegario Shearer with [facility] is working on getting her another one by Thursday of this week.  Per Helene Kelp, they will take the wound Vac off and provide a wet-to-dry dressing until a Wound Vac can be found.  Message also sent to Dr. Nell Range., LPN

## 2019-09-29 IMAGING — DX RIGHT FOOT COMPLETE - 3+ VIEW
3 series · 3 of 3 positions shown · non-contrast
Comparison: Radiographs dated 12/21/2018

CLINICAL DATA: Redness and swelling of the medial aspect of the
right foot. Previous amputation of the hallux and first metatarsal.

EXAM:
RIGHT FOOT COMPLETE - 3+ VIEW

[x foot ap right]
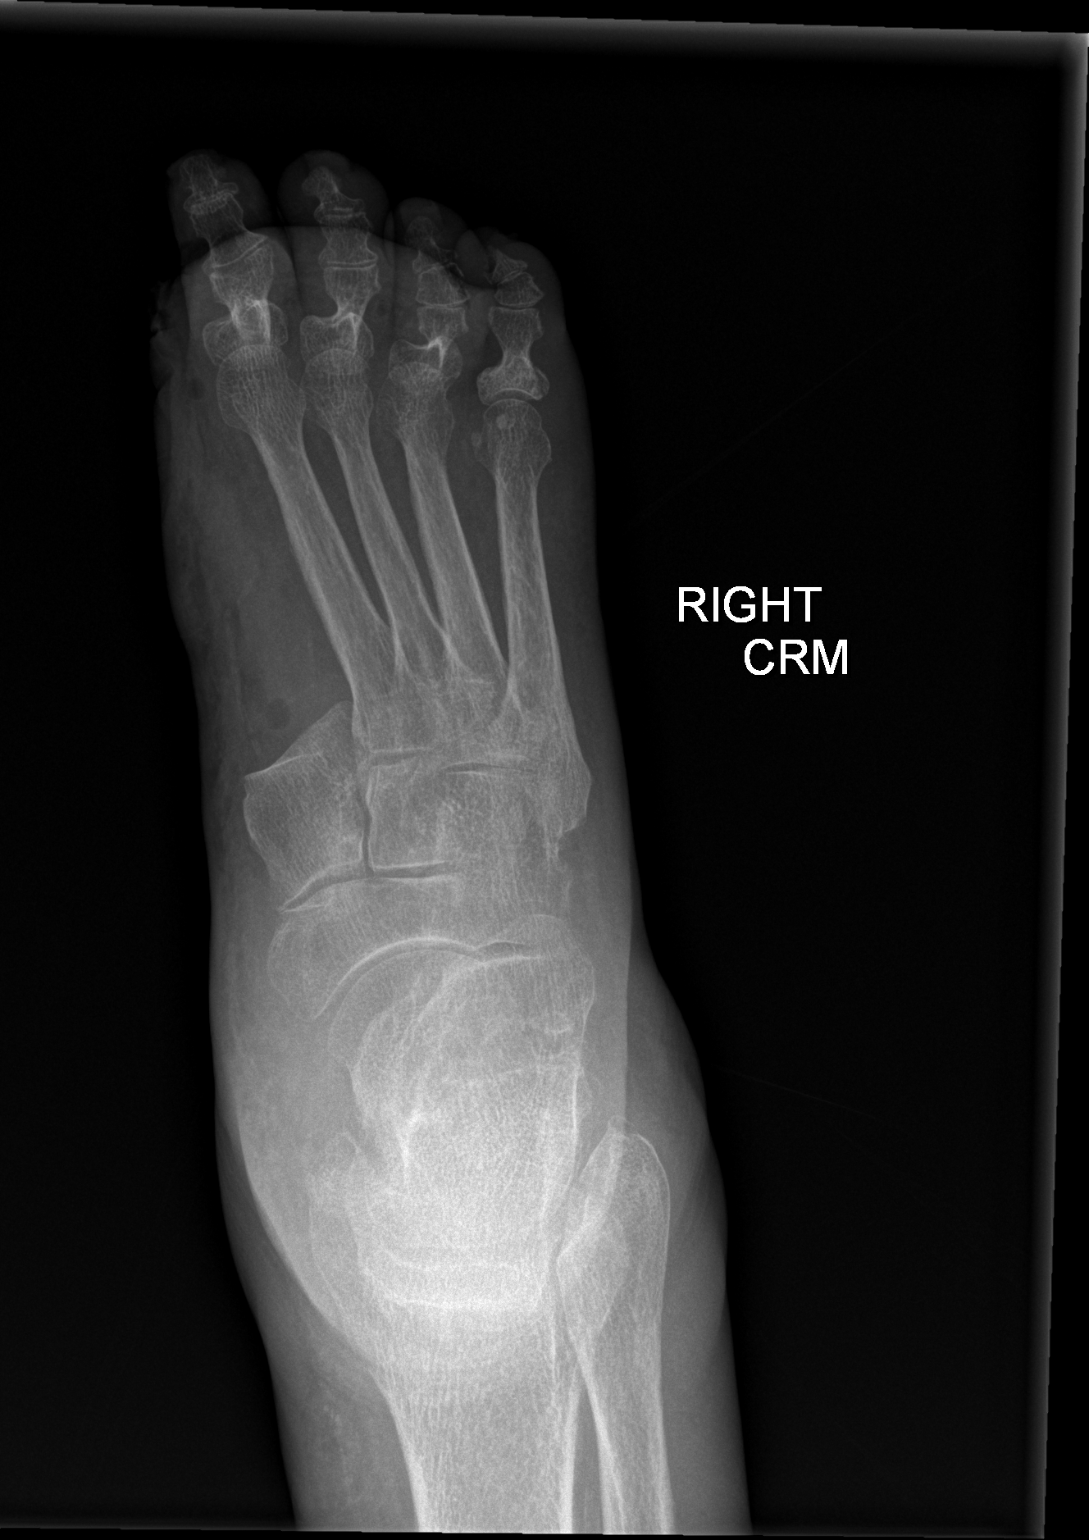

[x foot obl right]
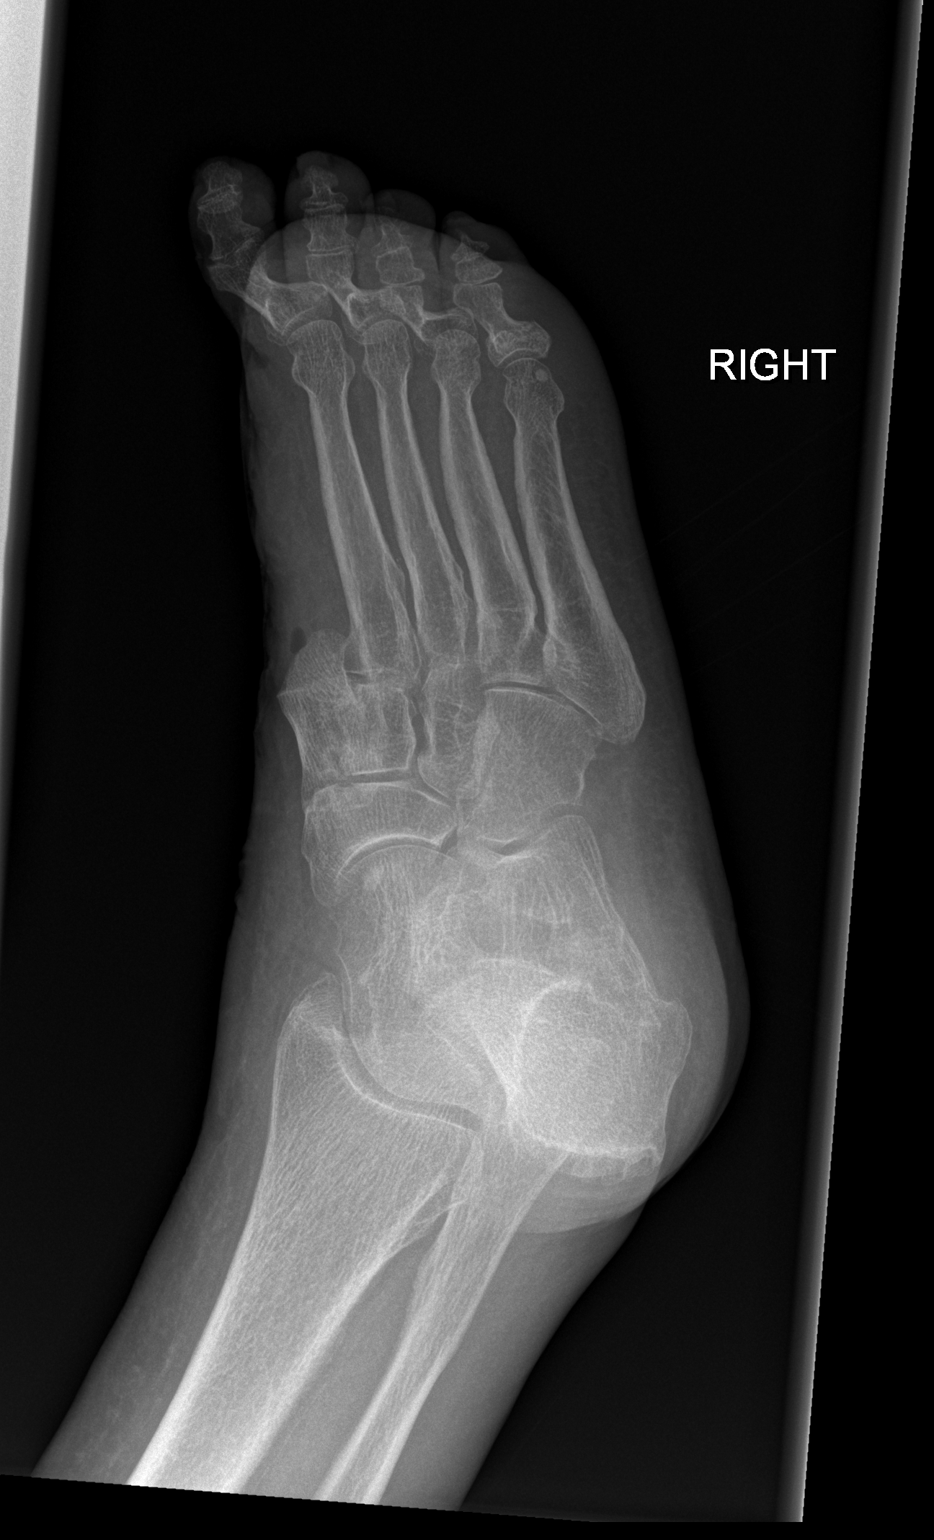

[x foot lat right]
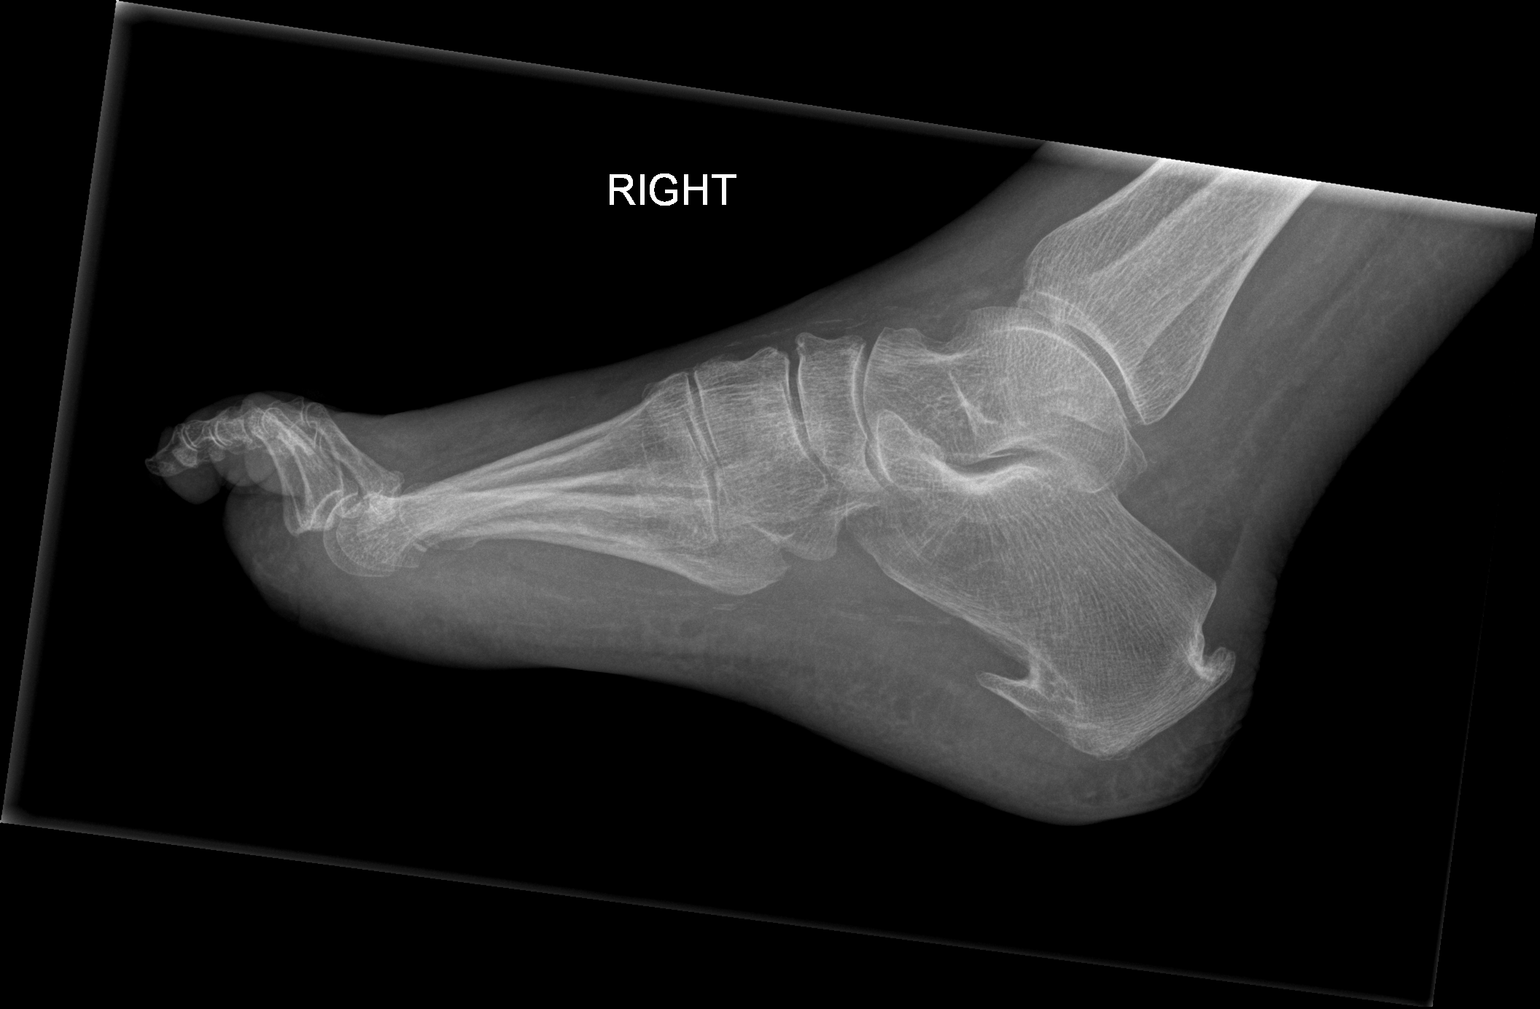

[3 of 3 positions shown; findings below may reference images not displayed]

FINDINGS: There is edema and a small amount of gas in the soft tissues at the
site of the previous resection of the first metatarsal. No evidence
of osteomyelitis of the remaining bones. Slight arthritis between
navicular and medial cuneiform, stable. Chronic calcaneal
enthesophytes. Chronic dorsal spurring in the midfoot.
IMPRESSION: 1. No evidence of osteomyelitis.
2. Edema and small amount of gas in the soft tissues at the site of
the previous resection of the first metatarsal and great toe. This
could be residual postsurgical edema and air. Infection is not
excluded.

## 2019-10-01 ENCOUNTER — Telehealth: Payer: Self-pay | Admitting: *Deleted

## 2019-10-01 NOTE — Telephone Encounter (Signed)
-----   Message from Heath Lark, MD sent at 10/01/2019  7:16 AM EST ----- Regarding: lost to follow-up She was seen 2 years ago, lost to follow-up Hospitalist called me recently with recurrent admissions for many things. I suggest wait until her cellulitis improved How is she doing? Is she interested to return for appt? If so, please schedule see me and labs after appt, 30 mins next week If not, let me know and I will document

## 2019-10-01 NOTE — Telephone Encounter (Signed)
Telephone call to patient- she reports she does not have cellulitis as far as she knows. She states she has many follow ups with the wound clinic following her toe amputation. She does want to follow up with Dr. Alvy Bimler but will wait until her wound vac is removed. She is not sure when this will happen yet. She will call the office to make an appointment.

## 2019-10-05 ENCOUNTER — Telehealth (HOSPITAL_COMMUNITY): Payer: Self-pay

## 2019-10-05 NOTE — Telephone Encounter (Signed)

## 2019-10-08 ENCOUNTER — Encounter: Payer: Self-pay | Admitting: Surgery

## 2019-10-08 ENCOUNTER — Other Ambulatory Visit: Payer: Self-pay

## 2019-10-08 ENCOUNTER — Ambulatory Visit (INDEPENDENT_AMBULATORY_CARE_PROVIDER_SITE_OTHER): Payer: Self-pay | Admitting: Surgery

## 2019-10-08 VITALS — BP 121/46 | HR 74 | Temp 98.1°F | Resp 20 | Ht 68.0 in | Wt 167.0 lb

## 2019-10-08 DIAGNOSIS — I7025 Atherosclerosis of native arteries of other extremities with ulceration: Secondary | ICD-10-CM

## 2019-10-08 DIAGNOSIS — Z992 Dependence on renal dialysis: Secondary | ICD-10-CM

## 2019-10-08 DIAGNOSIS — N186 End stage renal disease: Secondary | ICD-10-CM

## 2019-10-08 NOTE — Progress Notes (Signed)
Vascular and Vein Specialist of [facility]  Patient name: Kristen Berry MRN: [id number] DOB: 16-Aug-1956 Sex: female   REASON FOR VISIT:    Follow p  HISOTRY OF PRESENT ILLNESS:    Kristen Berry is a 64 y.o. female who is status post left femoral to above-knee popliteal artery bypass graft with ipsilateral translocated nonreversed greater saphenous vein on 08/14/2019.  She required return trip to the operating room for evacuation of hematoma in the groin.  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 Sharol Given in April 2020.  On 08/21/2019, she underwent a fistulogram on the left upper arm fistula. There was a tight stenosis at the AV anastomosis that was ~ 80%.  It had failed balloon dilation in the past and Dr. Donzetta Matters elected not to intervent and will need open revision once her foot declares itself.  She has hx of superficialization and branch ligation of the left BC AVF on 02/15/2019 and angioplasty of the AV anastomosis on 05/22/2019 by Dr. Donzetta Matters. PAST MEDICAL HISTORY:   Past Medical History:  Diagnosis Date  . Anemia   . Arthritis    "hands, knees" (10/10/2017)  . Asthma   . CHF (congestive heart failure) ([place])   . Coronary artery disease   . ESRD on hemodialysis [facility])    Dialysis T/ Th/ Sat  . H/O Clostridium difficile infection   . High cholesterol   . History of kidney stones   . Hypertension   . PVD (peripheral vascular disease) ([place])    "LLE; will have OR" (10/10/2017)  . Sleep apnea    "never given mask" (10/10/2017)  . Stroke [facility])    mild stroke mid April - 2019  . Type II diabetes mellitus ([place])    "no RX anymore" (10/10/2017)     FAMILY HISTORY:   Family History  Problem Relation Age of Onset  . Hypertension Father   . Heart failure Maternal Grandmother   . Heart failure Maternal Grandfather     SOCIAL HISTORY:   Social History   Tobacco Use  . Smoking status: Never Smoker  . Smokeless tobacco:  Never Used  Substance Use Topics  . Alcohol use: No     ALLERGIES:   Allergies  Allergen Reactions  . Crestor [Rosuvastatin Calcium] Other (See Comments)    Leg pain     CURRENT MEDICATIONS:   Current Outpatient Medications  Medication Sig Dispense Refill  . acetaminophen (TYLENOL) 500 MG tablet Take 2,000 mg by mouth every 4 (four) hours as needed (pain).     [name] albuterol (PROAIR HFA) 108 (90 Base) MCG/ACT inhaler Inhale 2 puffs into the lungs every 6 (six) hours as needed for wheezing or shortness of breath.     [name] apixaban (ELIQUIS) 2.5 MG TABS tablet Take 1 tablet (2.5 mg total) by mouth 2 (two) times daily. 60 tablet   . aspirin EC 81 MG EC tablet Take 1 tablet (81 mg total) by mouth daily.    [name] atorvastatin (LIPITOR) 20 MG tablet Take 1 tablet (20 mg total) by mouth daily at 6 PM. (Patient taking differently: Take 20 mg by mouth at bedtime. ) 30 tablet 0  . calcitRIOL (ROCALTROL) 0.25 MCG capsule Take 1 capsule (0.25 mcg total) by mouth every Monday, Wednesday, and Friday with hemodialysis.    [name] carvedilol (COREG) 12.5 MG tablet Take 1 tablet (12.5 mg total) by mouth 2 (two) times daily with a meal.    . clotrimazole-betamethasone (LOTRISONE)  cream Apply 1 application topically daily as needed (rash).   0  . Melatonin 5 MG CAPS Take 5 mg by mouth at bedtime.    . pantoprazole (PROTONIX) 40 MG tablet Take 40 mg by mouth at bedtime.     . pentafluoroprop-tetrafluoroeth (GEBAUERS) AERO Apply 1 application topically as needed (topical anesthesia for hemodialysis).  0  . polyethylene glycol (MIRALAX / GLYCOLAX) packet Take 17 g by mouth daily as needed for mild constipation. 14 each 0  . saccharomyces boulardii (FLORASTOR) 250 MG capsule Take 1 capsule (250 mg total) by mouth 2 (two) times daily. (Patient taking differently: Take 250 mg by mouth at bedtime. ) 20 capsule 0   No current facility-administered medications for this visit.    REVIEW OF SYSTEMS:   [X]  denotes positive  finding, [ ]  denotes negative finding Cardiac  Comments:  Chest pain or chest pressure:    Shortness of breath upon exertion:    Short of breath when lying flat:    Irregular heart rhythm:        Vascular    Pain in calf, thigh, or hip brought on by ambulation:    Pain in feet at night that wakes you up from your sleep:     Blood clot in your veins:    Leg swelling:         Pulmonary    Oxygen at home:    Productive cough:     Wheezing:         Neurologic    Sudden weakness in arms or legs:     Sudden numbness in arms or legs:     Sudden onset of difficulty speaking or slurred speech:    Temporary loss of vision in one eye:     Problems with dizziness:         Gastrointestinal    Blood in stool:     Vomited blood:         Genitourinary    Burning when urinating:     Blood in urine:        Psychiatric    Major depression:         Hematologic    Bleeding problems:    Problems with blood clotting too easily:        Skin    Rashes or ulcers:        Constitutional    Fever or chills:      PHYSICAL EXAM:   Vitals:   10/08/19 1203  BP: (!) 121/46  Pulse: 74  Resp: 20  Temp: 98.1 F (36.7 C)  SpO2: 100%  Weight: 167 lb (75.8 kg)  Height: 5\' 8"  (1.727 m)    GENERAL: The patient is a well-nourished female, in no acute distress. The vital signs are documented above. CARDIAC: There is a regular rate and rhythm. PULMONARY: Non-labored respirations MUSCULOSKELETAL: Right leg amputation PSYCHIATRIC: The patient has a normal affect. Excellent appearing granulation tissue on the left foot.  STUDIES:   None  MEDICAL ISSUES:   Arterial insufficiency with ulceration, left leg: The patient had an extensive to amputation and debridement of necrotic tissue on her foot.  I was very concerned about her ability to heal this wound.  Fortunately, this has made significant improvements.  There is excellent granulation tissue.  She will continue using the wound VAC.  I have  her scheduled for follow-up in 1 month with a duplex of her bypass graft and for wound check  End-stage renal disease:  The patient has a catheter currently.  Her fistula has occluded.  She will need new access in the near future.  When she returns in 1 month I will get vein mapping to determine her next access site.    Leia Alf, MD, FACS [facility] and Vein Specialists of [facility] [phone] Pager [phone]

## 2019-10-09 ENCOUNTER — Other Ambulatory Visit: Payer: Self-pay | Admitting: *Deleted

## 2019-10-09 DIAGNOSIS — Z95828 Presence of other vascular implants and grafts: Secondary | ICD-10-CM

## 2019-10-09 DIAGNOSIS — I739 Peripheral vascular disease, unspecified: Secondary | ICD-10-CM

## 2019-11-19 ENCOUNTER — Ambulatory Visit: Payer: Medicare HMO | Admitting: Surgery

## 2019-11-19 ENCOUNTER — Other Ambulatory Visit (HOSPITAL_COMMUNITY): Payer: Medicare HMO

## 2019-11-19 ENCOUNTER — Encounter (HOSPITAL_COMMUNITY): Payer: Medicare HMO

## 2019-12-05 ENCOUNTER — Other Ambulatory Visit: Payer: Self-pay

## 2019-12-05 ENCOUNTER — Telehealth: Payer: Self-pay

## 2019-12-05 MED ORDER — SULFAMETHOXAZOLE-TRIMETHOPRIM 800-160 MG PO TABS: 1.0000 | ORAL_TABLET | Freq: Two times a day (BID) | ORAL | 0 refills | Status: DC

## 2019-12-05 NOTE — Telephone Encounter (Addendum)
Pt is aware.  Her son will pick up her Rx.  Thurston Hole., LPN  ----- Message from Serafina Mitchell, MD sent at 12/05/2019  9:25 AM EST ----- Regarding: RE: Wound Contact: [phone] I f she can't come in early, I guess the only thing we can do is start her on ABX.  Please give her bactrim BID  ----- Message ----- From: Kaleen Mask, LPN Sent: 6/0/1561  53:79 AM EST To: Serafina Mitchell, MD Subject: Wound                                          Hey Dr. Liliane Channel, RN from [facility] called. Says when she got to the patient today, the wound on her foot looked very angry and bright red and "looks like it might break open any minute".  Patient took the wound vac off herself after it started beeping this morning.  Settings are 126mm/hg with black foam.  Pt uses her heel to transfer only and is not having pain.  She is not able to come in for an earlier appt.  Please advise.  Thank you,  Thurston Hole., LPN

## 2019-12-06 DIAGNOSIS — D473 Essential (hemorrhagic) thrombocythemia: Secondary | ICD-10-CM | POA: Diagnosis not present

## 2019-12-17 ENCOUNTER — Inpatient Hospital Stay (HOSPITAL_COMMUNITY): Admission: RE | Admit: 2019-12-17 | Payer: Medicare HMO | Source: Ambulatory Visit

## 2019-12-17 ENCOUNTER — Ambulatory Visit: Payer: Medicare HMO | Admitting: Surgery

## 2019-12-17 ENCOUNTER — Other Ambulatory Visit (HOSPITAL_COMMUNITY): Payer: Medicare HMO

## 2019-12-17 ENCOUNTER — Encounter (HOSPITAL_COMMUNITY): Payer: Medicare HMO

## 2020-01-14 ENCOUNTER — Ambulatory Visit: Payer: Medicare HMO | Admitting: Surgery

## 2020-01-17 DIAGNOSIS — D473 Essential (hemorrhagic) thrombocythemia: Secondary | ICD-10-CM | POA: Diagnosis not present

## 2020-01-25 ENCOUNTER — Telehealth: Payer: Self-pay

## 2020-01-25 NOTE — Telephone Encounter (Signed)
-----   Message from Serafina Mitchell, MD sent at 01/25/2020  9:17 AM EDT ----- Regarding: RE: Wound VAC Contact: [phone] Sure ----- Message ----- From: Kaleen Mask, LPN Sent: 8/34/1962   2:44 PM EDT To: Serafina Mitchell, MD Subject: Wound VAC                                      Hey Dr. Trula Slade.  [facility] is calling about this patient.  Says Wound Vac on L foot only has very small amount of drainage.  Can she d/c the wound Vac and replace with Calcium Alginate to be changed twice weekly?  Pt has appt on 05/10.    Please advise.  Thanks,  Thurston Hole., LPN

## 2020-01-31 ENCOUNTER — Other Ambulatory Visit: Payer: Self-pay | Admitting: *Deleted

## 2020-01-31 DIAGNOSIS — Z95828 Presence of other vascular implants and grafts: Secondary | ICD-10-CM

## 2020-01-31 DIAGNOSIS — I739 Peripheral vascular disease, unspecified: Secondary | ICD-10-CM

## 2020-02-01 ENCOUNTER — Telehealth (HOSPITAL_COMMUNITY): Payer: Self-pay

## 2020-02-01 NOTE — Telephone Encounter (Signed)

## 2020-02-01 NOTE — Telephone Encounter (Signed)

## 2020-02-04 ENCOUNTER — Other Ambulatory Visit (HOSPITAL_COMMUNITY): Payer: Medicare HMO

## 2020-02-04 ENCOUNTER — Encounter (HOSPITAL_COMMUNITY): Payer: Medicare HMO

## 2020-02-04 ENCOUNTER — Ambulatory Visit: Payer: Medicare HMO | Admitting: Surgery

## 2020-02-19 ENCOUNTER — Other Ambulatory Visit: Payer: Self-pay

## 2020-03-10 ENCOUNTER — Ambulatory Visit (INDEPENDENT_AMBULATORY_CARE_PROVIDER_SITE_OTHER): Payer: Medicare HMO | Admitting: Surgery

## 2020-03-10 ENCOUNTER — Ambulatory Visit (INDEPENDENT_AMBULATORY_CARE_PROVIDER_SITE_OTHER)
Admission: RE | Admit: 2020-03-10 | Discharge: 2020-03-10 | Disposition: A | Discharge status: Home / Self Care | Payer: Medicare HMO | Source: Ambulatory Visit | Attending: Surgery | Admitting: Surgery

## 2020-03-10 ENCOUNTER — Other Ambulatory Visit: Payer: Self-pay

## 2020-03-10 ENCOUNTER — Other Ambulatory Visit: Payer: Self-pay | Admitting: *Deleted

## 2020-03-10 ENCOUNTER — Encounter: Payer: Self-pay | Admitting: Surgery

## 2020-03-10 ENCOUNTER — Ambulatory Visit (HOSPITAL_COMMUNITY)
Admission: RE | Admit: 2020-03-10 | Discharge: 2020-03-10 | Disposition: A | Discharge status: Home / Self Care | Payer: Medicare HMO | Source: Ambulatory Visit | Attending: Surgery | Admitting: Surgery

## 2020-03-10 ENCOUNTER — Ambulatory Visit (HOSPITAL_COMMUNITY): Admission: RE | Admit: 2020-03-10 | Payer: Medicare HMO | Source: Ambulatory Visit

## 2020-03-10 VITALS — BP 122/62 | HR 86 | Temp 98.1°F | Resp 20 | Ht 68.0 in | Wt 167.0 lb

## 2020-03-10 DIAGNOSIS — Z95828 Presence of other vascular implants and grafts: Secondary | ICD-10-CM

## 2020-03-10 DIAGNOSIS — N186 End stage renal disease: Secondary | ICD-10-CM

## 2020-03-10 DIAGNOSIS — I739 Peripheral vascular disease, unspecified: Secondary | ICD-10-CM | POA: Insufficient documentation

## 2020-03-10 DIAGNOSIS — I7025 Atherosclerosis of native arteries of other extremities with ulceration: Secondary | ICD-10-CM | POA: Diagnosis not present

## 2020-03-10 DIAGNOSIS — Z992 Dependence on renal dialysis: Secondary | ICD-10-CM | POA: Diagnosis not present

## 2020-03-10 NOTE — Progress Notes (Signed)
Vascular and Vein Specialist of [facility]  Patient name: Kristen Berry MRN: [id number] DOB: [birth year] Sex: female   REASON FOR VISIT:    Follow up  HISOTRY OF PRESENT ILLNESS:    Kristen Berry is a 64 y.o. female who is status post left femoral to above-knee popliteal artery bypass graft with ipsilateral translocated nonreversed greater saphenous vein on 08/14/2019.  She required return trip to the operating room for evacuation of hematoma in the groin.  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 Sharol Given in April 2020.  PAST MEDICAL HISTORY:   Past Medical History:  Diagnosis Date  . Anemia   . Arthritis    "hands, knees" (10/10/2017)  . Asthma   . CHF (congestive heart failure) ([place])   . Coronary artery disease   . ESRD on hemodialysis [facility])    Dialysis T/ Th/ Sat  . H/O Clostridium difficile infection   . High cholesterol   . History of kidney stones   . Hypertension   . PVD (peripheral vascular disease) ([place])    "LLE; will have OR" (10/10/2017)  . Sleep apnea    "never given mask" (10/10/2017)  . Stroke [facility])    mild stroke mid April - 2019  . Type II diabetes mellitus ([place])    "no RX anymore" (10/10/2017)     FAMILY HISTORY:   Family History  Problem Relation Age of Onset  . Hypertension Father   . Heart failure Maternal Grandmother   . Heart failure Maternal Grandfather     SOCIAL HISTORY:   Social History   Tobacco Use  . Smoking status: Never Smoker  . Smokeless tobacco: Never Used  Substance Use Topics  . Alcohol use: No     ALLERGIES:   Allergies  Allergen Reactions  . Kristen Berry [Rosuvastatin Calcium] Other (See Comments)    Leg pain     CURRENT MEDICATIONS:   Current Outpatient Medications  Medication Sig Dispense Refill  . acetaminophen (TYLENOL) 500 MG tablet Take 2,000 mg by mouth every 4 (four) hours as needed (pain).     [name] albuterol (PROAIR HFA) 108 (90  Base) MCG/ACT inhaler Inhale 2 puffs into the lungs every 6 (six) hours as needed for wheezing or shortness of breath.     [name] apixaban (ELIQUIS) 2.5 MG TABS tablet Take 1 tablet (2.5 mg total) by mouth 2 (two) times daily. 60 tablet   . aspirin EC 81 MG EC tablet Take 1 tablet (81 mg total) by mouth daily.    [name] atorvastatin (LIPITOR) 20 MG tablet Take 1 tablet (20 mg total) by mouth daily at 6 PM. (Patient taking differently: Take 20 mg by mouth at bedtime. ) 30 tablet 0  . calcitRIOL (ROCALTROL) 0.25 MCG capsule Take 1 capsule (0.25 mcg total) by mouth every Monday, Wednesday, and Friday with hemodialysis.    [name] calcium acetate (PHOSLO) 667 MG capsule     . carvedilol (COREG) 12.5 MG tablet Take 1 tablet (12.5 mg total) by mouth 2 (two) times daily with a meal.    . clotrimazole-betamethasone (LOTRISONE) cream Apply 1 application topically daily as needed (rash).   0  . hydrALAZINE (APRESOLINE) 25 MG tablet     . hydroxyurea (HYDREA) 500 MG capsule Take 500 mg by mouth daily.    . Melatonin 5 MG CAPS Take 5 mg by mouth at bedtime.    . pantoprazole (PROTONIX) 40 MG tablet Take 40 mg by mouth at bedtime.     [name]  pentafluoroprop-tetrafluoroeth (GEBAUERS) AERO Apply 1 application topically as needed (topical anesthesia for hemodialysis).  0  . polyethylene glycol (MIRALAX / GLYCOLAX) packet Take 17 g by mouth daily as needed for mild constipation. 14 each 0  . saccharomyces boulardii (FLORASTOR) 250 MG capsule Take 1 capsule (250 mg total) by mouth 2 (two) times daily. (Patient taking differently: Take 250 mg by mouth at bedtime. ) 20 capsule 0   No current facility-administered medications for this visit.    REVIEW OF SYSTEMS:   [X]  denotes positive finding, [ ]  denotes negative finding Cardiac  Comments:  Chest pain or chest pressure:    Shortness of breath upon exertion:    Short of breath when lying flat:    Irregular heart rhythm:        Vascular    Pain in calf, thigh, or hip brought on  by ambulation:    Pain in feet at night that wakes you up from your sleep:     Blood clot in your veins:    Leg swelling:         Pulmonary    Oxygen at home:    Productive cough:     Wheezing:         Neurologic    Sudden weakness in arms or legs:     Sudden numbness in arms or legs:     Sudden onset of difficulty speaking or slurred speech:    Temporary loss of vision in one eye:     Problems with dizziness:         Gastrointestinal    Blood in stool:     Vomited blood:         Genitourinary    Burning when urinating:     Blood in urine:        Psychiatric    Major depression:         Hematologic    Bleeding problems:    Problems with blood clotting too easily:        Skin    Rashes or ulcers: x       Constitutional    Fever or chills:      PHYSICAL EXAM:   Vitals:   03/10/20 1049  BP: 122/62  Pulse: 86  Resp: 20  Temp: 98.1 F (36.7 C)  SpO2: 98%  Weight: 167 lb (75.8 kg)  Height: 5\' 8"  (1.727 m)    GENERAL: The patient is a well-nourished female, in no acute distress. The vital signs are documented above. CARDIAC: There is a regular rate and rhythm.  VASCULAR: I cannot palpate a pedal pulse PULMONARY: Non-labored respirations MUSCULOSKELETAL: There are no major deformities or cyanosis. NEUROLOGIC: No focal weakness or paresthesias are detected. SKIN: See photo below  PSYCHIATRIC: The patient has a normal affect.    STUDIES:   I have reviewed the following ultrasound studies: +-------+-----------+-----------+------------+------------+  ABI/TBIToday's ABIToday's TBIPrevious ABIPrevious TBI  +-------+-----------+-----------+------------+------------+  Right BKA    BKA    BKA     BKA       +-------+-----------+-----------+------------+------------+  Left  0.80    amputation 0.62    0.29      +-------+-----------+-----------+------------+------------+   BYPASS: Left: 50-74% stenosis noted in the  common femoral artery. Patent left  fem-pop bypass graft with >70% stenosis at the proximal anastomosis.    +-----------------+-------------+----------+--------+  Right Cephalic  Diameter (cm)Depth (cm)Findings  +-----------------+-------------+----------+--------+  Shoulder       0.37              +-----------------+-------------+----------+--------+  Prox upper arm    0.24              +-----------------+-------------+----------+--------+  Mid upper arm    0.22              +-----------------+-------------+----------+--------+  Dist upper arm    0.19              +-----------------+-------------+----------+--------+  Antecubital fossa  0.41              +-----------------+-------------+----------+--------+  Prox forearm     0.23              +-----------------+-------------+----------+--------+  Mid forearm     0.26              +-----------------+-------------+----------+--------+  Dist forearm     0.25              +-----------------+-------------+----------+--------+   +-----------------+-------------+----------+--------+  Right Basilic  Diameter (cm)Depth (cm)Findings  +-----------------+-------------+----------+--------+  Prox upper arm    0.55              +-----------------+-------------+----------+--------+  Mid upper arm    0.47              +-----------------+-------------+----------+--------+  Dist upper arm    0.37              +-----------------+-------------+----------+--------+  Antecubital fossa  0.36              +-----------------+-------------+----------+--------+  Prox forearm     0.39              +-----------------+-------------+----------+--------+    +-----------------+-------------+----------+--------------------+  Left Cephalic  Diameter (cm)Depth (cm)   Findings     +-----------------+-------------+----------+--------------------+  Shoulder       0.72        Part of a patent AVF  +-----------------+-------------+----------+--------------------+  Prox upper arm    0.67        Part of a patent AVF  +-----------------+-------------+----------+--------------------+  Mid upper arm    0.81        Part of a patent AVF  +-----------------+-------------+----------+--------------------+  Dist upper arm    0.99        Part of a patent AVF  +-----------------+-------------+----------+--------------------+  Antecubital fossa  0.95        Part of a patent AVF  +-----------------+-------------+----------+--------------------+  Prox forearm     0.34                    +-----------------+-------------+----------+--------------------+  Mid forearm     0.37                    +-----------------+-------------+----------+--------------------+   +-----------------+-------------+----------+--------+  Left Basilic   Diameter (cm)Depth (cm)Findings  +-----------------+-------------+----------+--------+  Prox upper arm    0.66              +-----------------+-------------+----------+--------+  Mid upper arm    0.42              +-----------------+-------------+----------+--------+  Dist upper arm    0.45              +-----------------+-------------+----------+--------+  Antecubital fossa  0.46              +-----------------+-------------+----------+--------+  Prox forearm     0.33              +-----------------+-------------+----------+--------+   While mapping the left cephalic vein, the left brachial  cephalic AVF was  discovered to be patent with diameters as described above, a low flow  volume of 557 cc/min recorded at the brachial artery inflow and an  increase of velocities in the cephalic vein  at the shoulder from 112 cm/sec at the shoulder to 387 cm/sec at the  confluence with the subclavian vein suggestive of a >50% stenosis.  Summary: Right: Cephalic and basilic veins are patent with diameters as     described above.  Left: Cephalic and basilic veins are patent with diameters as    described above.      >50% stenosis in the left brachio-cephalic AVF in the    shoulder.  MEDICAL ISSUES:   Left foot wound: This continues to make excellent progress.  She is no longer wearing a wound VAC and is just doing topical care.  Her ultrasound of her bypass today suggested proximal anastomotic stenosis.  I think this needs to be further evaluated with angiography.  I will plan on accessing the right groin and intervening on the proximal anastomosis if indicated.  ESRD: The patient had a fistulogram performed several months ago which shows a resistant anastomotic stenosis.  Ultrasound today shows her fistula remains patent.  She has a catheter in place.  I told her I would recommend revising her fistula by transecting the vein near the existing anastomosis and relocating the anastomosis slightly more proximal.  The patient is on Eliquis.  I would like to schedule these 2 procedures simultaneously.  We have selected Thursday, July 1.  This will be in room 16.  She will be off of her Eliquis.  Leia Alf, MD, FACS [facility] Vein Specialists of [facility] [phone] Pager [phone]

## 2020-03-10 NOTE — H&P (View-Only) (Signed)
Vascular and Vein Specialist of [facility]  Patient name: Trenise Turay MRN: [id number] DOB: 03-26-1956 Sex: female   REASON FOR VISIT:    Follow up  HISOTRY OF PRESENT ILLNESS:    NAOMA BOXELL is a 64 y.o. female who is status post left femoral to above-knee popliteal artery bypass graft with ipsilateral translocated nonreversed greater saphenous vein on 08/14/2019.  She required return trip to the operating room for evacuation of hematoma in the groin.  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 Sharol Given in April 2020.  PAST MEDICAL HISTORY:   Past Medical History:  Diagnosis Date  . Anemia   . Arthritis    "hands, knees" (10/10/2017)  . Asthma   . CHF (congestive heart failure) ([place])   . Coronary artery disease   . ESRD on hemodialysis [facility])    Dialysis T/ Th/ Sat  . H/O Clostridium difficile infection   . High cholesterol   . History of kidney stones   . Hypertension   . PVD (peripheral vascular disease) ([place])    "LLE; will have OR" (10/10/2017)  . Sleep apnea    "never given mask" (10/10/2017)  . Stroke [facility])    mild stroke mid April - 2019  . Type II diabetes mellitus ([place])    "no RX anymore" (10/10/2017)     FAMILY HISTORY:   Family History  Problem Relation Age of Onset  . Hypertension Father   . Heart failure Maternal Grandmother   . Heart failure Maternal Grandfather     SOCIAL HISTORY:   Social History   Tobacco Use  . Smoking status: Never Smoker  . Smokeless tobacco: Never Used  Substance Use Topics  . Alcohol use: No     ALLERGIES:   Allergies  Allergen Reactions  . Crestor [Rosuvastatin Calcium] Other (See Comments)    Leg pain     CURRENT MEDICATIONS:   Current Outpatient Medications  Medication Sig Dispense Refill  . acetaminophen (TYLENOL) 500 MG tablet Take 2,000 mg by mouth every 4 (four) hours as needed (pain).     [name] albuterol (PROAIR HFA) 108 (90  Base) MCG/ACT inhaler Inhale 2 puffs into the lungs every 6 (six) hours as needed for wheezing or shortness of breath.     [name] apixaban (ELIQUIS) 2.5 MG TABS tablet Take 1 tablet (2.5 mg total) by mouth 2 (two) times daily. 60 tablet   . aspirin EC 81 MG EC tablet Take 1 tablet (81 mg total) by mouth daily.    [name] atorvastatin (LIPITOR) 20 MG tablet Take 1 tablet (20 mg total) by mouth daily at 6 PM. (Patient taking differently: Take 20 mg by mouth at bedtime. ) 30 tablet 0  . calcitRIOL (ROCALTROL) 0.25 MCG capsule Take 1 capsule (0.25 mcg total) by mouth every Monday, Wednesday, and Friday with hemodialysis.    [name] calcium acetate (PHOSLO) 667 MG capsule     . carvedilol (COREG) 12.5 MG tablet Take 1 tablet (12.5 mg total) by mouth 2 (two) times daily with a meal.    . clotrimazole-betamethasone (LOTRISONE) cream Apply 1 application topically daily as needed (rash).   0  . hydrALAZINE (APRESOLINE) 25 MG tablet     . hydroxyurea (HYDREA) 500 MG capsule Take 500 mg by mouth daily.    . Melatonin 5 MG CAPS Take 5 mg by mouth at bedtime.    . pantoprazole (PROTONIX) 40 MG tablet Take 40 mg by mouth at bedtime.     [name]  pentafluoroprop-tetrafluoroeth (GEBAUERS) AERO Apply 1 application topically as needed (topical anesthesia for hemodialysis).  0  . polyethylene glycol (MIRALAX / GLYCOLAX) packet Take 17 g by mouth daily as needed for mild constipation. 14 each 0  . saccharomyces boulardii (FLORASTOR) 250 MG capsule Take 1 capsule (250 mg total) by mouth 2 (two) times daily. (Patient taking differently: Take 250 mg by mouth at bedtime. ) 20 capsule 0   No current facility-administered medications for this visit.    REVIEW OF SYSTEMS:   [X]  denotes positive finding, [ ]  denotes negative finding Cardiac  Comments:  Chest pain or chest pressure:    Shortness of breath upon exertion:    Short of breath when lying flat:    Irregular heart rhythm:        Vascular    Pain in calf, thigh, or hip brought on  by ambulation:    Pain in feet at night that wakes you up from your sleep:     Blood clot in your veins:    Leg swelling:         Pulmonary    Oxygen at home:    Productive cough:     Wheezing:         Neurologic    Sudden weakness in arms or legs:     Sudden numbness in arms or legs:     Sudden onset of difficulty speaking or slurred speech:    Temporary loss of vision in one eye:     Problems with dizziness:         Gastrointestinal    Blood in stool:     Vomited blood:         Genitourinary    Burning when urinating:     Blood in urine:        Psychiatric    Major depression:         Hematologic    Bleeding problems:    Problems with blood clotting too easily:        Skin    Rashes or ulcers: x       Constitutional    Fever or chills:      PHYSICAL EXAM:   Vitals:   03/10/20 1049  BP: 122/62  Pulse: 86  Resp: 20  Temp: 98.1 F (36.7 C)  SpO2: 98%  Weight: 167 lb (75.8 kg)  Height: 5\' 8"  (1.727 m)    GENERAL: The patient is a well-nourished female, in no acute distress. The vital signs are documented above. CARDIAC: There is a regular rate and rhythm.  VASCULAR: I cannot palpate a pedal pulse PULMONARY: Non-labored respirations MUSCULOSKELETAL: There are no major deformities or cyanosis. NEUROLOGIC: No focal weakness or paresthesias are detected. SKIN: See photo below  PSYCHIATRIC: The patient has a normal affect.    STUDIES:   I have reviewed the following ultrasound studies: +-------+-----------+-----------+------------+------------+  ABI/TBIToday's ABIToday's TBIPrevious ABIPrevious TBI  +-------+-----------+-----------+------------+------------+  Right BKA    BKA    BKA     BKA       +-------+-----------+-----------+------------+------------+  Left  0.80    amputation 0.62    0.29      +-------+-----------+-----------+------------+------------+   BYPASS: Left: 50-74% stenosis noted in the  common femoral artery. Patent left  fem-pop bypass graft with >70% stenosis at the proximal anastomosis.    +-----------------+-------------+----------+--------+  Right Cephalic  Diameter (cm)Depth (cm)Findings  +-----------------+-------------+----------+--------+  Shoulder       0.37              +-----------------+-------------+----------+--------+  Prox upper arm    0.24              +-----------------+-------------+----------+--------+  Mid upper arm    0.22              +-----------------+-------------+----------+--------+  Dist upper arm    0.19              +-----------------+-------------+----------+--------+  Antecubital fossa  0.41              +-----------------+-------------+----------+--------+  Prox forearm     0.23              +-----------------+-------------+----------+--------+  Mid forearm     0.26              +-----------------+-------------+----------+--------+  Dist forearm     0.25              +-----------------+-------------+----------+--------+   +-----------------+-------------+----------+--------+  Right Basilic  Diameter (cm)Depth (cm)Findings  +-----------------+-------------+----------+--------+  Prox upper arm    0.55              +-----------------+-------------+----------+--------+  Mid upper arm    0.47              +-----------------+-------------+----------+--------+  Dist upper arm    0.37              +-----------------+-------------+----------+--------+  Antecubital fossa  0.36              +-----------------+-------------+----------+--------+  Prox forearm     0.39              +-----------------+-------------+----------+--------+    +-----------------+-------------+----------+--------------------+  Left Cephalic  Diameter (cm)Depth (cm)   Findings     +-----------------+-------------+----------+--------------------+  Shoulder       0.72        Part of a patent AVF  +-----------------+-------------+----------+--------------------+  Prox upper arm    0.67        Part of a patent AVF  +-----------------+-------------+----------+--------------------+  Mid upper arm    0.81        Part of a patent AVF  +-----------------+-------------+----------+--------------------+  Dist upper arm    0.99        Part of a patent AVF  +-----------------+-------------+----------+--------------------+  Antecubital fossa  0.95        Part of a patent AVF  +-----------------+-------------+----------+--------------------+  Prox forearm     0.34                    +-----------------+-------------+----------+--------------------+  Mid forearm     0.37                    +-----------------+-------------+----------+--------------------+   +-----------------+-------------+----------+--------+  Left Basilic   Diameter (cm)Depth (cm)Findings  +-----------------+-------------+----------+--------+  Prox upper arm    0.66              +-----------------+-------------+----------+--------+  Mid upper arm    0.42              +-----------------+-------------+----------+--------+  Dist upper arm    0.45              +-----------------+-------------+----------+--------+  Antecubital fossa  0.46              +-----------------+-------------+----------+--------+  Prox forearm     0.33              +-----------------+-------------+----------+--------+   While mapping the left cephalic vein, the left brachial  cephalic AVF was  discovered to be patent with diameters as described above, a low flow  volume of 557 cc/min recorded at the brachial artery inflow and an  increase of velocities in the cephalic vein  at the shoulder from 112 cm/sec at the shoulder to 387 cm/sec at the  confluence with the subclavian vein suggestive of a >50% stenosis.  Summary: Right: Cephalic and basilic veins are patent with diameters as     described above.  Left: Cephalic and basilic veins are patent with diameters as    described above.      >50% stenosis in the left brachio-cephalic AVF in the    shoulder.  MEDICAL ISSUES:   Left foot wound: This continues to make excellent progress.  She is no longer wearing a wound VAC and is just doing topical care.  Her ultrasound of her bypass today suggested proximal anastomotic stenosis.  I think this needs to be further evaluated with angiography.  I will plan on accessing the right groin and intervening on the proximal anastomosis if indicated.  ESRD: The patient had a fistulogram performed several months ago which shows a resistant anastomotic stenosis.  Ultrasound today shows her fistula remains patent.  She has a catheter in place.  I told her I would recommend revising her fistula by transecting the vein near the existing anastomosis and relocating the anastomosis slightly more proximal.  The patient is on Eliquis.  I would like to schedule these 2 procedures simultaneously.  We have selected Thursday, July 1.  This will be in room 16.  She will be off of her Eliquis.  Leia Alf, MD, FACS [facility] [phone] Pager [phone]

## 2020-03-20 DIAGNOSIS — D473 Essential (hemorrhagic) thrombocythemia: Secondary | ICD-10-CM | POA: Diagnosis not present

## 2020-03-25 ENCOUNTER — Other Ambulatory Visit (HOSPITAL_COMMUNITY): Payer: Medicare HMO

## 2020-04-01 ENCOUNTER — Other Ambulatory Visit (HOSPITAL_COMMUNITY): Payer: Medicare HMO

## 2020-04-02 ENCOUNTER — Encounter (HOSPITAL_COMMUNITY): Payer: Self-pay | Admitting: Surgery

## 2020-04-02 NOTE — Anesthesia Preprocedure Evaluation (Addendum)
Anesthesia Evaluation  Patient identified by MRN, date of birth, ID band Patient awake    Reviewed: Allergy & Precautions, NPO status , Patient's Chart, lab work & pertinent test results, reviewed documented beta blocker date and time   Airway Mallampati: II  TM Distance: >3 FB Neck ROM: Limited    Dental  (+) Dental Advisory Given, Teeth Intact   Pulmonary asthma , sleep apnea ,    breath sounds clear to auscultation       Cardiovascular hypertension, Pt. on medications and Pt. on home beta blockers + CAD, + Peripheral Vascular Disease, +CHF and + DVT   Rhythm:Regular Rate:Normal  TEE 01/16/18 ([facility]): Interpretation Summary  There is mild mitral regurgitation (1+).  Prominent eutachian valve. This likely accounts for findings on  thransthoracic echo.  There is severe (nonmobile) atherosclerosis of the descending thoracic  aorta.  No evidence of endocarditis.  No intra-atrial shunt observed. The left ventricle is grossly normal size. The left ventricular ejection  fraction is grossly normal.  The right ventricle is normal size. The rightventricular ejection fraction  is normal.    Cardiac cath 11/01/17:  Severe diffuse diabetic coronary disease. Severe diffuse coronary calcification particularly in the left main and LAD territory.  40-50% distal left main.  Segmental diffuse ostial to proximal LAD 70-80% stenosis.  Unusual circumflex anatomy. Diffuse distal disease. No focal high-grade obstruction. 40% obstruction in the mid first obtuse marginal.  RCA is dominant. The continuation of the RCA beyond the PDA contains 75% obstruction. The mid PDA contains 70% obstruction proximal to bifurcation. Distal first left ventricular branch contains 70% obstruction.  Chronic combined systolic and diastolic heart failure with EF in the 45-50% range. Severe elevation in LVEDP at 30  mmHg. -RECOMMENDATIONS:  Multivessel diffuse coronary disease with regions of significant obstruction within diffuse pattern of atherosclerosis as noted above. There is no high-grade obstruction with the appearance of instability. She has borderline significant distal left main disease  Initial recommendation is aggressive medical therapy. There is no ideal targets for PCI. The diffuse nature of disease would make coronary bypass surgery difficult but I believe it is still possible. I do not believe she is yet at a point where CABG should be recommended.   Neuro/Psych CVA    GI/Hepatic Neg liver ROS, GERD  ,  Endo/Other  diabetes, Type 2  Renal/GU ESRF and DialysisRenal disease     Musculoskeletal  (+) Arthritis ,   Abdominal   Peds  Hematology  (+) anemia ,   Anesthesia Other Findings   Reproductive/Obstetrics                          Anesthesia Physical Anesthesia Plan  ASA: III  Anesthesia Plan: General   Post-op Pain Management:    Induction:   PONV Risk Score and Plan: 2 and Ondansetron, Propofol infusion and Treatment may vary due to age or medical condition  Airway Management Planned: Oral ETT and LMA  Additional Equipment: None  Intra-op Plan:   Post-operative Plan:   Informed Consent: I have reviewed the patients History and Physical, chart, labs and discussed the procedure including the risks, benefits and alternatives for the proposed anesthesia with the patient or authorized representative who has indicated his/her understanding and acceptance.       Plan Discussed with: CRNA, Anesthesiologist and Surgeon  Anesthesia Plan Comments: (   )     Anesthesia Quick Evaluation

## 2020-04-02 NOTE — Progress Notes (Addendum)
Patient denies shortness of breath, fever, cough or chest pain.  PCP - Dr. Sara Martinique Cardiologist - n/a  Chest x-ray - 08/18/19 (1V) EKG - 08/09/19 Stress Test - n/a ECHO - 10/17/17 Cardiac Cath - 11/01/17  Sleep Study - Yes CPAP - Does not have CPAP  Fasting Blood Sugar - Unknown  Checks Blood Sugar  0  times a day DM 2, diet controlled, no medications.  Patient does not have cbg machine to check blood sugar.  Blood Thinner Instructions:  Follow your surgeon's instructions on when to stop Eliquis prior to surgery.  Last dose on 03/30/20  Aspirin Instructions: Follow your surgeon's instructions on when to stop aspirin prior to surgery,  If no instructions were given by your surgeon then you will need to call the office for those instructions.  Anesthesia review: Yes  STOP now taking any Aspirin (unless otherwise instructed by your surgeon), Aleve, Naproxen, Ibuprofen, Motrin, Advil, Goody's, BC's, all herbal medications, fish oil, and all vitamins.   Coronavirus Screening Covid test scheduled on DOS. Do you have any of the following symptoms:  Cough yes/no: No Fever (>100.76F)  yes/no: No Runny nose yes/no: No Sore throat yes/no: No Difficulty breathing/shortness of breath  yes/no: No  Have you traveled in the last 14 days and where? yes/no: No  Patient verbalized understanding of instructions that were given via phone.

## 2020-04-02 NOTE — Progress Notes (Addendum)
Anesthesia Chart Review: SAME DAY WORK-UP   Case: [id number] Date/Time: 04/03/20 0715   Procedures:      REVISION OF ARTERIOVENOUS FISTULA (Left Arm Lower)     LOWER EXTREMITY ANGIOGRAM WITH RUNOFF AND POSSIBLE INTERVENTION (Left Leg Lower)   Anesthesia type: Choice   Pre-op diagnosis: END STAGE RENAL DISEASE, PERIPHERAL ARTERY DISEASE.   Location: [facility] OR ROOM 16 / [place] [facility]   Surgeons: Serafina Mitchell, MD      DISCUSSION: Patient is a 64 year old female scheduled for the above procedure. She had recent vascular surgery follow-up for PVD and hemodialysis access. Dr. Trula Slade notes that her left foot is healing well but [place] suggestive of proximal anastomatic stenosis, and recent fistulogram showed LUE AVF with anastomotic stenosis. Since she is on Eliquis, he wanted to schedule procedure simultaneously. Last Elilquis 03/30/20.   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 Tamala Julian,  "There is no ideal targets for PCI. The diffuse nature of disease would make coronary bypass surgery difficult but I believe it is still possible. I do not believe she is yet at a point where CABG should be recommended"), chronic systolic CHF, ESRD (started hemodialysis 2019), OSA (no CPAP), C. difficile colitis (2019), DVT (BLE 10/28/17).   Last cardiology note seen is from 11/04/17 by Leanor Kail, PA and Fransico Him, MD for preoperative evaluation for left FPBG. Based on 2019 cath films and improvement of symptoms since starting dialysis, medical management  recommended for CAD (no good PCI options and not felt to be at a point where CABG was indicated). LVEF improved to 50-55% per echo 12/2017 done following TIA. She denied SOB and chest pain per PAT RN phone interview.   She is a dialysis patient. Underwent left FPBG last late last year. Patient is a same day work-up, further evaluation by her anesthesia team on the day of surgery.  She is for presurgical COVID-19 testing on the day of surgery.   VS: Ht 5\' 8"  (1.727 m)   Wt 79.4 kg   LMP  (LMP Unknown)   BMI 26.61 kg/m   BP Readings from Last 3 Encounters:  03/10/20 122/62  10/08/19 (!) 121/46  09/07/19 123/70    PROVIDERS: [place], Sarah T, MD is PCP   Daneen Schick, MD is cardiologist (new evaluation 10/17/17). Last cardiology evaluation 11/04/17. Last echo 12/2017 ([facility]).   LABS: For day of surgery.    IMAGES: 1V PCXR 08/18/19: FINDINGS: A right internal jugular central venous catheter tip overlies the right atrium. The heart remains mildly enlarged. Vascular calcifications are seen in the aortic arch. There are diffuse bilateral interstitial opacities which are not significantly changed. There is no pleural effusion or pneumothorax. IMPRESSION: 1. No significant interval change in diffuse bilateral interstitial opacities which may reflect pulmonary edema or atypical pneumonia.   EKG: 08/06/19:  Sinus tachycardia at 115 bpm Prolonged PR interval Probable left atrial enlargement Borderline T abnormalities, diffuse leads Borderline prolonged QT interval Confirmed by Davonna Belling [phone]) on 08/06/2019 9:20:40 PM   CV: Carotid US 01/18/18 ([facility]): IMPRESSION:  1. Bilateral carotid plaque with no  significant stenosis of the common carotid or internal carotid arteries.   2. Both vertebral arteries are patent with antegrade flow.    TEE 01/16/18 ([facility]): Interpretation Summary  There is mild mitral regurgitation (1+).  Prominent eutachian valve. This likely  accounts for findings on  thransthoracic echo.  There is severe [nonmobile] atherosclerosis of the descending thoracic  aorta.  No evidence of endocarditis.  No intra-atrial shunt observed. The left ventricle is grossly normal size. The left ventricular ejection  fraction is grossly normal.  The right ventricle is normal size. The rightventricular ejection fraction  is normal.    TTE 01/14/18 ([facility]): Interpretation Summary  A complete portable two-dimensional transthoracic echocardiogram with color  flow Doppler and Spectral Doppler was performed. Saline contrast injection  was performed. Clinical correlation is recommended. Would consider a  transesophageal echocardiogram if clinically indicated. The study was  technically adequate.  The left ventricle is grossly normal size.  The right ventricular ejection fraction is normal.  The left atrium is moderately dilated.  There is septal hypokinesis.  The left ventricular ejection fraction is normal (50-55%).  Grade I mild diastolic dysfunction; abnormal relaxation pattern.  There is basal inferior and inferoseptal hypokinesis.  There is normal left ventricular wall thickness.  Contrast injected, negative contrast shadow in RA suggestive of left to  right shunt.  There is a density in the RA. Density in RA may represent a catheter or  pacing wire.   (Comparison TTE 10/17/17: LV septal and apical akinesis, mild LVH, LVEF 35-40%, mild MS, moderate MR, PA peak pressure 55 mmHg)    Cardiac cath 11/01/17:   Severe diffuse diabetic coronary disease. Severe diffuse coronary calcification particularly in the left main and LAD territory.  40-50% distal left main.  Segmental diffuse ostial to proximal LAD 70-80% stenosis.  Unusual circumflex anatomy. Diffuse distal disease. No focal high-grade obstruction. 40% obstruction in the mid first obtuse marginal.  RCA is dominant. The continuation of the RCA beyond the PDA contains 75%  obstruction. The mid PDA contains 70% obstruction proximal to bifurcation. Distal first left ventricular branch contains 70% obstruction.  Chronic combined systolic and diastolic heart failure with EF in the 45-50% range. Severe elevation in LVEDP at 30 mmHg. - RECOMMENDATIONS:  Multivessel diffuse coronary disease with regions of significant obstruction within diffuse pattern of atherosclerosis as noted above. There is no high-grade obstruction with the appearance of instability. She has borderline significant distal left main disease  Initial recommendation is aggressive medical therapy. There is no ideal targets for PCI. The diffuse nature of disease would make coronary bypass surgery difficult but I believe it is still possible. I do not believe she is yet at a point where CABG should be recommended.     Past Medical History:  Diagnosis Date  . Anemia   . Arthritis    "hands, knees" (10/10/2017)  . Asthma   . CHF (congestive heart failure) ([place])   . Coronary artery disease   . ESRD on hemodialysis [facility])    Dialysis M/W/F  . GERD (gastroesophageal reflux disease)   . H/O Clostridium difficile infection   . High cholesterol   . History of kidney stones   . Hypertension   . PVD (peripheral vascular disease) ([place])    "LLE; will have OR" (10/10/2017)  . Sleep apnea    "never given mask" (10/10/2017), no cpap  . Stroke [facility])    mild stroke mid April - 2019  . Type II diabetes mellitus ([place])  diet controlled, no meds    Past Surgical History:  Procedure Laterality Date  . A/V FISTULAGRAM N/A 12/27/2018   Procedure: A/V FISTULAGRAM - Left Upper;  Surgeon: Serafina Mitchell, MD;  Location: [place] CV LAB;  Service: Cardiovascular;  Laterality: N/A;  . A/V FISTULAGRAM Left 05/22/2019   Procedure: A/V FISTULAGRAM;  Surgeon: Serafina Mitchell, MD;  Location: [place] CV LAB;  Service: Cardiovascular;  Laterality: Left;  . A/V FISTULAGRAM Left 08/21/2019   Procedure: A/V  FISTULAGRAM - Left Arm;  Surgeon: Waynetta Sandy, MD;  Location: [place] CV LAB;  Service: Cardiovascular;  Laterality: Left;  . AMPUTATION Left 11/10/2017   Procedure: AMPUTATION DIGIT SECOND TOE LEFT FOOT;  Surgeon: Waynetta Sandy, MD;  Location: [place];  Service: Vascular;  Laterality: Left;  . AMPUTATION Left 01/24/2018   Procedure: AMPUTATION THIRD TOE LEFT FOOT;  Surgeon: Waynetta Sandy, MD;  Location: [place];  Service: Vascular;  Laterality: Left;  . AMPUTATION Right 12/22/2018   Procedure: Right foot 1st ray amputation;  Surgeon: Newt Minion, MD;  Location: [place];  Service: Orthopedics;  Laterality: Right;  . AMPUTATION Right 01/05/2019   Procedure: RIGHT BELOW KNEE AMPUTATION;  Surgeon: Newt Minion, MD;  Location: [place];  Service: Orthopedics;  Laterality: Right;  . AMPUTATION Left 08/14/2019   Procedure: AMPUTATION LEFT GREAT TOE;  Surgeon: Serafina Mitchell, MD;  Location: [facility];  Service: Vascular;  Laterality: Left;  . AORTOGRAM N/A 11/03/2017   Procedure: Ultrasound Guided Cannulation Right Common Femoral Artery;  Aortagram with Left Lower Extremity Arteriogram; Attempted Treatment Left Superficial Femoral Artery; Percutaneous Closure Right Common Femoral Arteriotomy with Proglide Device;  Surgeon: Waynetta Sandy, MD;  Location: [place];  Service: Vascular;  Laterality: N/A;  . APPLICATION OF WOUND VAC Right 01/05/2019   Procedure: Application Of Wound Vac;  Surgeon: Newt Minion, MD;  Location: [place];  Service: Orthopedics;  Laterality: Right;  . APPLICATION OF WOUND VAC Left 08/14/2019   Procedure: Application Of Wound Vac to left foot;  Surgeon: Serafina Mitchell, MD;  Location: [facility] OR;  Service: Vascular;  Laterality: Left;  . AV FISTULA PLACEMENT Left 11/10/2017   Procedure: ARTERIOVENOUS (AV) FISTULA CREATION LEFT UPPER ARM;  Surgeon: Waynetta Sandy, MD;  Location: [place];  Service: Vascular;  Laterality: Left;  . EXCHANGE OF A  DIALYSIS CATHETER Right 02/15/2019   Procedure: EXCHANGE OF A TUNNELED DIALYSIS CATHETER;  Surgeon: Serafina Mitchell, MD;  Location: [place];  Service: Vascular;  Laterality: Right;  . FEMORAL-POPLITEAL BYPASS GRAFT Left 08/14/2019   Procedure: LEFT FEMORAL-POPLITEAL ARTERY Bypass Graft;  Surgeon: Serafina Mitchell, MD;  Location: [place];  Service: Vascular;  Laterality: Left;  . FISTULA SUPERFICIALIZATION Left 02/15/2019   Procedure: SUPERFICIALIZATION WITH BRANCH LIGATION BRACHIOCEPHALIC ARTERIOVENOUS FISTULA LEFT ARM;  Surgeon: Serafina Mitchell, MD;  Location: [place];  Service: Vascular;  Laterality: Left;  . HEMATOMA EVACUATION Left 08/14/2019   Procedure: EVACUATION HEMATOMA LEFT GROIN;  Surgeon: Serafina Mitchell, MD;  Location: [place];  Service: Vascular;  Laterality: Left;  . HERNIA REPAIR  1950s  . INSERTION OF DIALYSIS CATHETER Right 11/10/2017   Procedure: INSERTION OF TUNNELED DIALYSIS CATHETER RIGHT INTERNAL JUGULAR PLACEMENT;  Surgeon: Waynetta Sandy, MD;  Location: [place];  Service: Vascular;  Laterality: Right;  . IR FLUORO GUIDE CV LINE RIGHT  10/19/2017  . IR US GUIDE VASC ACCESS RIGHT  10/19/2017  . LEFT HEART CATH AND  CORONARY ANGIOGRAPHY N/A 11/01/2017   Procedure: LEFT HEART CATH AND CORONARY ANGIOGRAPHY;  Surgeon: Belva Crome, MD;  Location: [place] CV LAB;  Service: Cardiovascular;  Laterality: N/A;  . LOWER EXTREMITY ANGIOGRAM Left 02/15/2019   Procedure: ANGIOGRAM LEFT LOWER EXTREMITY;  Surgeon: Serafina Mitchell, MD;  Location: [place];  Service: Vascular;  Laterality: Left;  . LOWER EXTREMITY ANGIOGRAPHY N/A 12/27/2018   Procedure: LOWER EXTREMITY ANGIOGRAPHY - Right Lower;  Surgeon: Serafina Mitchell, MD;  Location: [place] CV LAB;  Service: Cardiovascular;  Laterality: N/A;  . PERIPHERAL VASCULAR BALLOON ANGIOPLASTY Left 05/22/2019   Procedure: PERIPHERAL VASCULAR BALLOON ANGIOPLASTY;  Surgeon: Serafina Mitchell, MD;  Location: [place] CV LAB;  Service:  Cardiovascular;  Laterality: Left;  fistula  . PERIPHERAL VASCULAR INTERVENTION Left 11/07/2017   Procedure: PERIPHERAL VASCULAR INTERVENTION;  Surgeon: Waynetta Sandy, MD;  Location: [place] CV LAB;  Service: Cardiovascular;  Laterality: Left;  left SFA  . PERIPHERAL VASCULAR INTERVENTION Right 12/27/2018   Procedure: PERIPHERAL VASCULAR INTERVENTION;  Surgeon: Serafina Mitchell, MD;  Location: [place] CV LAB;  Service: Cardiovascular;  Laterality: Right;  SFA  . SHOULDER OPEN ROTATOR CUFF REPAIR Left   . TUBAL LIGATION    . ULTRASOUND GUIDANCE FOR VASCULAR ACCESS  11/01/2017   Procedure: Ultrasound Guidance For Vascular Access;  Surgeon: Belva Crome, MD;  Location: [place] CV LAB;  Service: Cardiovascular;;  . ULTRASOUND GUIDANCE FOR VASCULAR ACCESS Right 02/15/2019   Procedure: Ultrasound Guidance For Vascular Access;  Surgeon: Serafina Mitchell, MD;  Location: [facility] OR;  Service: Vascular;  Laterality: Right;  . WOUND EXPLORATION Left 08/14/2019   Procedure: EXPLORATION OF LEFT FEMORAL -POPLITEAL ARTETRY BYPASS, REPAIR OF LEFT FEMORAL - POPLITEAL ARTERY BYPASS;  Surgeon: Serafina Mitchell, MD;  [facility];  Service: Vascular;  Laterality: Left;    MEDICATIONS: No current facility-administered medications for this encounter.   [name] acetaminophen (TYLENOL) 500 MG tablet  . albuterol (PROAIR HFA) 108 (90 Base) MCG/ACT inhaler  . apixaban (ELIQUIS) 2.5 MG TABS tablet  . aspirin EC 81 MG EC tablet  . atorvastatin (LIPITOR) 20 MG tablet  . Biotin 10000 MCG TBDP  . calcium acetate (PHOSLO) 667 MG capsule  . carvedilol (COREG) 25 MG tablet  . clotrimazole-betamethasone (LOTRISONE) cream  . hydrALAZINE (APRESOLINE) 25 MG tablet  . hydroxyurea (HYDREA) 500 MG capsule  . pantoprazole (PROTONIX) 40 MG tablet  . saccharomyces boulardii (FLORASTOR) 250 MG capsule  . calcitRIOL (ROCALTROL) 0.25 MCG capsule  . carvedilol (COREG) 12.5 MG tablet  . pentafluoroprop-tetrafluoroeth  (GEBAUERS) AERO    Myra Gianotti, PA-C Surgical Short Stay/Anesthesiology [facility] Phone [phone] [facility] Phone [phone] 04/02/2020 2:47 PM

## 2020-04-03 ENCOUNTER — Ambulatory Visit (HOSPITAL_COMMUNITY)
Admission: RE | Admit: 2020-04-03 | Discharge: 2020-04-03 | Disposition: A | Discharge status: Home / Self Care | Payer: Medicare HMO | Attending: Surgery | Admitting: Surgery

## 2020-04-03 ENCOUNTER — Ambulatory Visit (HOSPITAL_COMMUNITY): Payer: Medicare HMO

## 2020-04-03 ENCOUNTER — Other Ambulatory Visit: Payer: Self-pay

## 2020-04-03 ENCOUNTER — Ambulatory Visit (HOSPITAL_COMMUNITY): Payer: Medicare HMO | Admitting: Vascular Surgery

## 2020-04-03 ENCOUNTER — Encounter (HOSPITAL_COMMUNITY)
Admission: RE | Disposition: A | Discharge status: Home / Self Care | Payer: Self-pay | Source: Home / Self Care | Attending: Surgery

## 2020-04-03 ENCOUNTER — Encounter (HOSPITAL_COMMUNITY): Payer: Self-pay | Admitting: Surgery

## 2020-04-03 DIAGNOSIS — T82898A Other specified complication of vascular prosthetic devices, implants and grafts, initial encounter: Secondary | ICD-10-CM | POA: Diagnosis not present

## 2020-04-03 DIAGNOSIS — Z7982 Long term (current) use of aspirin: Secondary | ICD-10-CM | POA: Diagnosis not present

## 2020-04-03 DIAGNOSIS — G4730 Sleep apnea, unspecified: Secondary | ICD-10-CM | POA: Insufficient documentation

## 2020-04-03 DIAGNOSIS — I2510 Atherosclerotic heart disease of native coronary artery without angina pectoris: Secondary | ICD-10-CM | POA: Diagnosis not present

## 2020-04-03 DIAGNOSIS — N186 End stage renal disease: Secondary | ICD-10-CM | POA: Diagnosis not present

## 2020-04-03 DIAGNOSIS — Z79899 Other long term (current) drug therapy: Secondary | ICD-10-CM | POA: Diagnosis not present

## 2020-04-03 DIAGNOSIS — Z7901 Long term (current) use of anticoagulants: Secondary | ICD-10-CM | POA: Diagnosis not present

## 2020-04-03 DIAGNOSIS — Z992 Dependence on renal dialysis: Secondary | ICD-10-CM | POA: Insufficient documentation

## 2020-04-03 DIAGNOSIS — Y841 Kidney dialysis as the cause of abnormal reaction of the patient, or of later complication, without mention of misadventure at the time of the procedure: Secondary | ICD-10-CM | POA: Diagnosis not present

## 2020-04-03 DIAGNOSIS — I509 Heart failure, unspecified: Secondary | ICD-10-CM | POA: Insufficient documentation

## 2020-04-03 DIAGNOSIS — T82858A Stenosis of vascular prosthetic devices, implants and grafts, initial encounter: Secondary | ICD-10-CM | POA: Insufficient documentation

## 2020-04-03 DIAGNOSIS — E1122 Type 2 diabetes mellitus with diabetic chronic kidney disease: Secondary | ICD-10-CM | POA: Diagnosis not present

## 2020-04-03 DIAGNOSIS — I132 Hypertensive heart and chronic kidney disease with heart failure and with stage 5 chronic kidney disease, or end stage renal disease: Secondary | ICD-10-CM | POA: Diagnosis not present

## 2020-04-03 DIAGNOSIS — E1151 Type 2 diabetes mellitus with diabetic peripheral angiopathy without gangrene: Secondary | ICD-10-CM | POA: Insufficient documentation

## 2020-04-03 DIAGNOSIS — E7800 Pure hypercholesterolemia, unspecified: Secondary | ICD-10-CM | POA: Insufficient documentation

## 2020-04-03 DIAGNOSIS — Z20822 Contact with and (suspected) exposure to covid-19: Secondary | ICD-10-CM | POA: Insufficient documentation

## 2020-04-03 DIAGNOSIS — J45909 Unspecified asthma, uncomplicated: Secondary | ICD-10-CM | POA: Diagnosis not present

## 2020-04-03 DIAGNOSIS — Z8673 Personal history of transient ischemic attack (TIA), and cerebral infarction without residual deficits: Secondary | ICD-10-CM | POA: Insufficient documentation

## 2020-04-03 DIAGNOSIS — I739 Peripheral vascular disease, unspecified: Secondary | ICD-10-CM

## 2020-04-03 HISTORY — PX: REVISION OF ARTERIOVENOUS GORETEX GRAFT: SHX6073

## 2020-04-03 HISTORY — DX: Gastro-esophageal reflux disease without esophagitis: K21.9

## 2020-04-03 HISTORY — PX: LOWER EXTREMITY ANGIOGRAM: SHX5508

## 2020-04-03 LAB — POCT I-STAT, CHEM 8
BUN: 47 mg/dL — ABNORMAL HIGH (ref 8–23)
Calcium, Ion: 1.01 mmol/L — ABNORMAL LOW (ref 1.15–1.40)
Chloride: 97 mmol/L — ABNORMAL LOW (ref 98–111)
Creatinine, Ser: 5 mg/dL — ABNORMAL HIGH (ref 0.44–1.00)
Glucose, Bld: 184 mg/dL — ABNORMAL HIGH (ref 70–99)
HCT: 54 % — ABNORMAL HIGH (ref 36.0–46.0)
Hemoglobin: 18.4 g/dL — ABNORMAL HIGH (ref 12.0–15.0)
Potassium: 3.8 mmol/L (ref 3.5–5.1)
Sodium: 135 mmol/L (ref 135–145)
TCO2: 25 mmol/L (ref 22–32)

## 2020-04-03 LAB — PROTIME-INR
INR: 1.2 (ref 0.8–1.2)
Prothrombin Time: 14.3 seconds (ref 11.4–15.2)

## 2020-04-03 LAB — SARS CORONAVIRUS 2 BY RT PCR (HOSPITAL ORDER, PERFORMED IN ~~LOC~~ HOSPITAL LAB): SARS Coronavirus 2: NEGATIVE

## 2020-04-03 LAB — GLUCOSE, CAPILLARY: Glucose-Capillary: 169 mg/dL — ABNORMAL HIGH (ref 70–99)

## 2020-04-03 SURGERY — REVISION OF ARTERIOVENOUS GORETEX GRAFT
Anesthesia: General | Site: Leg Lower | Laterality: Left

## 2020-04-03 MED ORDER — 0.9 % SODIUM CHLORIDE (POUR BTL) OPTIME
TOPICAL | Status: DC | PRN
  Administered 2020-04-03: 1000 mL

## 2020-04-03 MED ORDER — FENTANYL CITRATE (PF) 100 MCG/2ML IJ SOLN: 25.0000 ug | INTRAMUSCULAR | Status: DC | PRN

## 2020-04-03 MED ORDER — APIXABAN 2.5 MG PO TABS: 2.5000 mg | ORAL_TABLET | Freq: Two times a day (BID) | ORAL | Status: AC

## 2020-04-03 MED ORDER — SODIUM CHLORIDE 0.9 % IV SOLN
INTRAVENOUS | Status: DC | PRN
  Administered 2020-04-03: 500 mL

## 2020-04-03 MED ORDER — ROCURONIUM BROMIDE 10 MG/ML (PF) SYRINGE
PREFILLED_SYRINGE | INTRAVENOUS | Status: DC | PRN
  Administered 2020-04-03: 40 mg via INTRAVENOUS

## 2020-04-03 MED ORDER — CHLORHEXIDINE GLUCONATE 4 % EX LIQD: 60.0000 mL | Freq: Once | CUTANEOUS | Status: DC

## 2020-04-03 MED ORDER — PROTAMINE SULFATE 10 MG/ML IV SOLN
INTRAVENOUS | Status: AC
  Filled 2020-04-03: qty 5

## 2020-04-03 MED ORDER — CEFAZOLIN SODIUM-DEXTROSE 2-4 GM/100ML-% IV SOLN
2.0000 g | INTRAVENOUS | Status: AC
  Administered 2020-04-03: 2 g via INTRAVENOUS
  Filled 2020-04-03: qty 100

## 2020-04-03 MED ORDER — DEXAMETHASONE SODIUM PHOSPHATE 10 MG/ML IJ SOLN
INTRAMUSCULAR | Status: AC
  Filled 2020-04-03: qty 1

## 2020-04-03 MED ORDER — PROPOFOL 10 MG/ML IV BOLUS
INTRAVENOUS | Status: AC
  Filled 2020-04-03: qty 20

## 2020-04-03 MED ORDER — LIDOCAINE 2% (20 MG/ML) 5 ML SYRINGE
INTRAMUSCULAR | Status: DC | PRN
  Administered 2020-04-03: 60 mg via INTRAVENOUS

## 2020-04-03 MED ORDER — SODIUM CHLORIDE 0.9 % IV SOLN: INTRAVENOUS | Status: DC

## 2020-04-03 MED ORDER — HEPARIN SODIUM (PORCINE) 1000 UNIT/ML IJ SOLN
INTRAMUSCULAR | Status: AC
  Filled 2020-04-03: qty 1

## 2020-04-03 MED ORDER — CHLORHEXIDINE GLUCONATE 0.12 % MT SOLN
15.0000 mL | Freq: Once | OROMUCOSAL | Status: AC
  Administered 2020-04-03: 15 mL via OROMUCOSAL
  Filled 2020-04-03: qty 15

## 2020-04-03 MED ORDER — ORAL CARE MOUTH RINSE: 15.0000 mL | Freq: Once | OROMUCOSAL | Status: AC

## 2020-04-03 MED ORDER — MIDAZOLAM HCL 2 MG/2ML IJ SOLN
INTRAMUSCULAR | Status: AC
  Filled 2020-04-03: qty 2

## 2020-04-03 MED ORDER — LIDOCAINE-EPINEPHRINE (PF) 1 %-1:200000 IJ SOLN
INTRAMUSCULAR | Status: AC
  Filled 2020-04-03: qty 30

## 2020-04-03 MED ORDER — FENTANYL CITRATE (PF) 250 MCG/5ML IJ SOLN
INTRAMUSCULAR | Status: AC
  Filled 2020-04-03: qty 5

## 2020-04-03 MED ORDER — HEMOSTATIC AGENTS (NO CHARGE) OPTIME
TOPICAL | Status: DC | PRN
  Administered 2020-04-03: 1 via TOPICAL

## 2020-04-03 MED ORDER — FENTANYL CITRATE (PF) 100 MCG/2ML IJ SOLN
INTRAMUSCULAR | Status: DC | PRN
  Administered 2020-04-03: 50 ug via INTRAVENOUS

## 2020-04-03 MED ORDER — PROPOFOL 10 MG/ML IV BOLUS
INTRAVENOUS | Status: DC | PRN
  Administered 2020-04-03: 100 mg via INTRAVENOUS

## 2020-04-03 MED ORDER — ACETAMINOPHEN 500 MG PO TABS
1000.0000 mg | ORAL_TABLET | Freq: Once | ORAL | Status: AC
  Administered 2020-04-03: 1000 mg via ORAL
  Filled 2020-04-03: qty 2

## 2020-04-03 MED ORDER — ONDANSETRON HCL 4 MG/2ML IJ SOLN
INTRAMUSCULAR | Status: DC | PRN
  Administered 2020-04-03: 4 mg via INTRAVENOUS

## 2020-04-03 MED ORDER — DEXAMETHASONE SODIUM PHOSPHATE 10 MG/ML IJ SOLN
INTRAMUSCULAR | Status: DC | PRN
  Administered 2020-04-03: 5 mg via INTRAVENOUS

## 2020-04-03 MED ORDER — PROTAMINE SULFATE 10 MG/ML IV SOLN
INTRAVENOUS | Status: DC | PRN
Start: 2020-04-03 — End: 2020-04-03
  Administered 2020-04-03: 25 mg via INTRAVENOUS

## 2020-04-03 MED ORDER — HYDROCODONE-ACETAMINOPHEN 5-325 MG PO TABS: 1.0000 | ORAL_TABLET | ORAL | 0 refills | Status: AC | PRN

## 2020-04-03 MED ORDER — PHENYLEPHRINE HCL-NACL 10-0.9 MG/250ML-% IV SOLN
INTRAVENOUS | Status: DC | PRN
Start: 2020-04-03 — End: 2020-04-03
  Administered 2020-04-03: 25 ug/min via INTRAVENOUS

## 2020-04-03 MED ORDER — SODIUM CHLORIDE 0.9 % IV SOLN
INTRAVENOUS | Status: AC
  Filled 2020-04-03: qty 1.2

## 2020-04-03 MED ORDER — PHENYLEPHRINE 40 MCG/ML (10ML) SYRINGE FOR IV PUSH (FOR BLOOD PRESSURE SUPPORT)
PREFILLED_SYRINGE | INTRAVENOUS | Status: DC | PRN
  Administered 2020-04-03 (×2): 40 ug via INTRAVENOUS
  Administered 2020-04-03 (×4): 80 ug via INTRAVENOUS

## 2020-04-03 MED ORDER — IODIXANOL 320 MG/ML IV SOLN
INTRAVENOUS | Status: DC | PRN
  Administered 2020-04-03: 50 mL via INTRAVENOUS

## 2020-04-03 MED ORDER — HEPARIN SODIUM (PORCINE) 1000 UNIT/ML IJ SOLN
INTRAMUSCULAR | Status: DC | PRN
  Administered 2020-04-03: 8000 [IU] via INTRAVENOUS

## 2020-04-03 MED ORDER — ONDANSETRON HCL 4 MG/2ML IJ SOLN
INTRAMUSCULAR | Status: AC
  Filled 2020-04-03: qty 2

## 2020-04-03 MED ORDER — MIDAZOLAM HCL 5 MG/5ML IJ SOLN
INTRAMUSCULAR | Status: DC | PRN
  Administered 2020-04-03: 2 mg via INTRAVENOUS

## 2020-04-03 SURGICAL SUPPLY — 55 items
BAG BANDED W/RUBBER/TAPE 36X54 (MISCELLANEOUS) IMPLANT
BAG SNAP BAND KOVER 36X36 (MISCELLANEOUS) ×3 IMPLANT
CANISTER SUCT 3000ML PPV (MISCELLANEOUS) ×3 IMPLANT
CATH ANGIO 5F BER2 65CM (CATHETERS) IMPLANT
CATH OMNI FLUSH 5F 65CM (CATHETERS) ×3 IMPLANT
CHLORAPREP W/TINT 26 (MISCELLANEOUS) ×3 IMPLANT
CLIP VESOCCLUDE MED 6/CT (CLIP) ×3 IMPLANT
CLIP VESOCCLUDE SM WIDE 6/CT (CLIP) ×3 IMPLANT
COVER DOME SNAP 22 D (MISCELLANEOUS) IMPLANT
COVER PROBE W GEL 5X96 (DRAPES) ×3 IMPLANT
DCB RANGER 4.0X60 135 (BALLOONS) ×2 IMPLANT
DERMABOND ADVANCED (GAUZE/BANDAGES/DRESSINGS) ×1
DERMABOND ADVANCED .7 DNX12 (GAUZE/BANDAGES/DRESSINGS) ×2 IMPLANT
DEVICE CLOSURE PERCLS PRGLD 6F (VASCULAR PRODUCTS) ×2 IMPLANT
DEVICE TORQUE H2O (MISCELLANEOUS) ×3 IMPLANT
DRAPE FEMORAL ANGIO 80X135IN (DRAPES) ×3 IMPLANT
DRSG TEGADERM 4X4.75 (GAUZE/BANDAGES/DRESSINGS) ×3 IMPLANT
ELECT REM PT RETURN 9FT ADLT (ELECTROSURGICAL) ×3
ELECTRODE REM PT RTRN 9FT ADLT (ELECTROSURGICAL) ×2 IMPLANT
GAUZE SPONGE 2X2 8PLY STRL LF (GAUZE/BANDAGES/DRESSINGS) ×2 IMPLANT
GLOVE BIOGEL PI IND STRL 7.5 (GLOVE) ×2 IMPLANT
GLOVE BIOGEL PI INDICATOR 7.5 (GLOVE) ×1
GLOVE SURG SS PI 7.5 STRL IVOR (GLOVE) ×3 IMPLANT
GOWN STRL REUS W/ TWL LRG LVL3 (GOWN DISPOSABLE) ×4 IMPLANT
GOWN STRL REUS W/ TWL XL LVL3 (GOWN DISPOSABLE) ×2 IMPLANT
GOWN STRL REUS W/TWL LRG LVL3 (GOWN DISPOSABLE) ×2
GOWN STRL REUS W/TWL XL LVL3 (GOWN DISPOSABLE) ×1
GUIDEWIRE ANGLED .035X150CM (WIRE) ×3 IMPLANT
HEMOSTAT SNOW SURGICEL 2X4 (HEMOSTASIS) ×3 IMPLANT
KIT BASIN OR (CUSTOM PROCEDURE TRAY) ×3 IMPLANT
KIT ENCORE 26 ADVANTAGE (KITS) ×3 IMPLANT
KIT TURNOVER KIT B (KITS) ×3 IMPLANT
NEEDLE HYPO 25GX1X1/2 BEV (NEEDLE) ×3 IMPLANT
NS IRRIG 1000ML POUR BTL (IV SOLUTION) ×3 IMPLANT
PACK CV ACCESS (CUSTOM PROCEDURE TRAY) ×3 IMPLANT
PAD ARMBOARD 7.5X6 YLW CONV (MISCELLANEOUS) ×6 IMPLANT
PERCLOSE PROGLIDE 6F (VASCULAR PRODUCTS) ×3
RANGER DCB 4.0X60 135 (BALLOONS) ×3
SET MICROPUNCTURE 5F STIFF (MISCELLANEOUS) ×3 IMPLANT
SHEATH AVANTI 11CM 5FR (SHEATH) ×3 IMPLANT
SHEATH PINNACLE ST 6F 45CM (SHEATH) ×3 IMPLANT
SPONGE GAUZE 2X2 STER 10/PKG (GAUZE/BANDAGES/DRESSINGS) ×1
STOPCOCK MORSE 400PSI 3WAY (MISCELLANEOUS) ×3 IMPLANT
SUT PROLENE 6 0 BV (SUTURE) ×3 IMPLANT
SUT VIC AB 3-0 SH 27 (SUTURE) ×1
SUT VIC AB 3-0 SH 27X BRD (SUTURE) ×2 IMPLANT
SUT VICRYL 4-0 PS2 18IN ABS (SUTURE) ×3 IMPLANT
SYR 10ML LL (SYRINGE) ×9 IMPLANT
SYR MEDRAD MARK V 150ML (SYRINGE) IMPLANT
TOWEL GREEN STERILE (TOWEL DISPOSABLE) ×6 IMPLANT
TUBING HIGH PRESSURE 120CM (CONNECTOR) ×3 IMPLANT
UNDERPAD 30X36 HEAVY ABSORB (UNDERPADS AND DIAPERS) ×3 IMPLANT
WATER STERILE IRR 1000ML POUR (IV SOLUTION) ×3 IMPLANT
WIRE BENTSON .035X145CM (WIRE) ×3 IMPLANT
WIRE SPARTACORE .014X300CM (WIRE) ×3 IMPLANT

## 2020-04-03 NOTE — Transfer of Care (Signed)
Immediate Anesthesia Transfer of Care Note  Patient: Dannette Kinkaid Pendell Borski  Procedure(s) Performed: REVISION OF ARTERIOVENOUS FISTULA, left arm (Left Arm Lower) LOWER EXTREMITY ANGIOGRAM WITH RUNOFF AND balloon agioplasty of left common femoral artery using drug coated balloon (Left Leg Lower)  Patient Location: PACU  Anesthesia Type:General  Level of Consciousness: drowsy and patient cooperative  Airway & Oxygen Therapy: Patient Spontanous Breathing and Patient connected to nasal cannula oxygen  Post-op Assessment: Report given to RN, Post -op Vital signs reviewed and stable and Patient moving all extremities  Post vital signs: Reviewed and stable  Last Vitals:  Vitals Value Taken Time  BP 140/68 04/03/20 1010  Temp    Pulse 65 04/03/20 1010  Resp 11 04/03/20 1010  SpO2 96 % 04/03/20 1010  Vitals shown include unvalidated device data.  Last Pain:  Vitals:   04/03/20 0652  TempSrc:   PainSc: 0-No pain         Complications: No complications documented.

## 2020-04-03 NOTE — Interval H&P Note (Signed)
History and Physical Interval Note:  04/03/2020 7:27 AM  Kristen Berry  has presented today for surgery, with the diagnosis of END STAGE RENAL DISEASE, PERIPHERAL ARTERY DISEASE.[name]  The various methods of treatment have been discussed with the patient and family. After consideration of risks, benefits and other options for treatment, the patient has consented to  Procedure(s): REVISION OF ARTERIOVENOUS FISTULA (Left) LOWER EXTREMITY ANGIOGRAM WITH RUNOFF AND POSSIBLE INTERVENTION (Left) as a surgical intervention.  The patient's history has been reviewed, patient examined, no change in status, stable for surgery.  I have reviewed the patient's chart and labs.  Questions were answered to the patient's satisfaction.     Annamarie Major

## 2020-04-03 NOTE — Anesthesia Postprocedure Evaluation (Signed)
Anesthesia Post Note  Patient: Kristen Berry  Procedure(s) Performed: REVISION OF ARTERIOVENOUS FISTULA, left arm (Left Arm Lower) LOWER EXTREMITY ANGIOGRAM WITH RUNOFF AND balloon agioplasty of left common femoral artery using drug coated balloon (Left Leg Lower)     Patient location during evaluation: PACU Anesthesia Type: General Level of consciousness: awake and alert Pain management: pain level controlled Vital Signs Assessment: post-procedure vital signs reviewed and stable Respiratory status: spontaneous breathing, nonlabored ventilation, respiratory function stable and patient connected to nasal cannula oxygen Cardiovascular status: blood pressure returned to baseline and stable Postop Assessment: no apparent nausea or vomiting Anesthetic complications: no   No complications documented.  Last Vitals:  Vitals:   04/03/20 1209 04/03/20 1309  BP: 139/62 137/67  Pulse: 93 94  Resp: 12 20  Temp:  (!) 36.3 C  SpO2: 98% 99%    Last Pain:  Vitals:   04/03/20 1209  TempSrc:   PainSc: 0-No pain                 Tiajuana Amass

## 2020-04-03 NOTE — Anesthesia Procedure Notes (Signed)
Procedure Name: Intubation Date/Time: 04/03/2020 7:49 AM Performed by: Moshe Salisbury, CRNA Pre-anesthesia Checklist: Patient identified, Emergency Drugs available, Suction available and Patient being monitored Patient Re-evaluated:Patient Re-evaluated prior to induction Oxygen Delivery Method: Circle System Utilized Preoxygenation: Pre-oxygenation with 100% oxygen Induction Type: IV induction Ventilation: Mask ventilation without difficulty Laryngoscope Size: Mac and 3 Grade View: Grade I Tube type: Oral Tube size: 7.5 mm Number of attempts: 1 Airway Equipment and Method: Stylet Placement Confirmation: ETT inserted through vocal cords under direct vision,  positive ETCO2 and breath sounds checked- equal and bilateral Secured at: 20 cm Tube secured with: Tape Dental Injury: Teeth and Oropharynx as per pre-operative assessment

## 2020-04-03 NOTE — Discharge Instructions (Signed)
[facility]  Discharge Instructions  AV Fistula or Graft Surgery for Dialysis Access  Please refer to the following instructions for your post-procedure care. Your surgeon or physician assistant will discuss any changes with you.  Activity  You may drive the day following your surgery, if you are comfortable and no longer taking prescription pain medication. Resume full activity as the soreness in your incision resolves.  Bathing/Showering  You may shower after you go home. Keep your incision dry for 48 hours. Do not soak in a bathtub, hot tub, or swim until the incision heals completely. You may not shower if you have a hemodialysis catheter.  Incision Care  Clean your incision with mild soap and water after 48 hours. Pat the area dry with a clean towel. You do not need a bandage unless otherwise instructed. Do not apply any ointments or creams to your incision. You may have skin glue on your incision. Do not peel it off. It will come off on its own in about one week. Your arm may swell a bit after surgery. To reduce swelling use pillows to elevate your arm so it is above your heart. Your doctor will tell you if you need to lightly wrap your arm with an ACE bandage.  Diet  Resume your normal diet. There are not special food restrictions following this procedure. In order to heal from your surgery, it is CRITICAL to get adequate nutrition. Your body requires vitamins, minerals, and protein. Vegetables are the best source of vitamins and minerals. Vegetables also provide the perfect balance of protein. Processed food has little nutritional value, so try to avoid this.  Medications  Resume taking all of your medications. If your incision is causing pain, you may take over-the counter pain relievers such as acetaminophen (Tylenol). If you were prescribed a stronger pain medication, please be aware these medications can cause nausea and constipation. Prevent  nausea by taking the medication with a snack or meal. Avoid constipation by drinking plenty of fluids and eating foods with high amount of fiber, such as fruits, vegetables, and grains.   Do not start taking Eliquis until 04/04/20  Do not take Tylenol if you are taking prescription pain medications.  Follow up Your surgeon may want to see you in the office following your access surgery. If so, this will be arranged at the time of your surgery.  Please call us immediately for any of the following conditions:   Increased pain, redness, drainage (pus) from your incision site  Fever of 101 degrees or higher  Severe or worsening pain at your incision site  Hand pain or numbness.   Reduce your risk of vascular disease:   Stop smoking. If you would like help, call [facility] at 1-800-QUIT-NOW 347-016-7407) or [place] at [place] your cholesterol  Maintain a desired weight  Control your diabetes  Keep your blood pressure down  Dialysis  It will take several weeks to several months for your new dialysis access to be ready for use. Your surgeon will determine when it is okay to use it. Your nephrologist will continue to direct your dialysis. You can continue to use your Permcath until your new access is ready for use.   04/03/2020 Kristen Berry [id number] 07/23/1956  Surgeon(s): Serafina Mitchell, MD  Procedure(s): REVISION OF ARTERIOVENOUS FISTULA LOWER EXTREMITY ANGIOGRAM WITH RUNOFF AND POSSIBLE INTERVENTION   May stick graft immediately   May stick graft on designated  area only:   X Do not stick left AV fistula for 4 weeks    If you have any questions, please call the office at [phone].

## 2020-04-03 NOTE — Op Note (Signed)
Patient name: Kristen Berry MRN: [id number] DOB: 26-Dec-1955 Sex: female  04/03/2020 Pre-operative Diagnosis: #1:  ESRD  #2 BPG stenosis Post-operative diagnosis:  Same Surgeon:  Annamarie Major Assistants:  Ivin Booty Procedure:   #1: Revision of left arm fistula   #2: Ultrasound-guided access, right femoral artery   #3: Left lower extremity runoff   #4: Drug-coated balloon angioplasty, left common femoral artery (103m)   #5: Closure device, Mynx Anesthesia: General Blood Loss: Minimal Specimens: None  Findings: The fistula was revised by transecting it near the old arterial venous anastomosis and redoing the arteriovenous anastomosis to a larger brachial vein.  There was approximately 50% stenosis within the left common femoral artery which was treated with 4 mm drug-coated balloon angioplasty  Indications: Patient has a fistula in her left arm that has undergone multiple attempts at percutaneous interventions without success.  She has a focal narrowing at her arteriovenous anastomosis.  She comes in today for more proximal revision of the anastomosis.  She also was found to have a stenosis in her bypass graft on the left and a wound that has not yet healed.  Because she is on Eliquis I elected to do these procedures simultaneously.  Procedure:  The patient was identified in the holding area and taken to [facility]  The patient was then placed supine on the table. general anesthesia was administered.  The patient was prepped and draped in the usual sterile fashion.  A time out was called and antibiotics were administered.  PA assistance was necessary for suction, retraction, exposure, and suture closure.  A longitudinal incision was made just proximal to the antecubital crease.  I dissected out first what appeared to be a radial artery likely from the high origin.  There did appear to be a deeper brachial artery which was more robust measuring about 3 mm.  This artery was encircled  with Vesseloops.  I then dissected out the cephalic vein down to the previous anastomosis.  This was a very large vein probably 7 to 8 mm.  The vein was marked for orientation.  I then ligated the vein at the level of the arterial venous anastomosis with two 2-0 silk ties.  The brachial artery was then occluded with baby Gregory clamps and a #11 blade was used to make an arteriotomy which was extended longitudinally with Potts scissors.  The vein was then cut the appropriate length.  A end-to-side anastomosis was performed with 6-0 Prolene.  Clamps were released.  There was a palpable thrill within the fistula and a palpable radial pulse.  The wound was irrigated.  Hemostasis was achieved.  The deep tissues were reapproximated with 3-0 Vicryl.  The skin was closed with 4 Monocryl.  Dermabond was applied.  Next attention was turned towards the right groin.  The right common femoral artery was evaluated with ultrasound.  It was pulsatile on ultrasound.  The right common femoral arteries were cannulated under ultrasound guidance with a micropuncture needle.  An 018 wire was advanced without resistance and a micropuncture sheath was placed.  A Bentson wire was then inserted followed by 5 French sheath.  Using this catheter and Bentson wire their bifurcation was crossed.  The cath was placed into the external iliac artery and left leg runoff was performed.  This showed widely patent left common and external iliac artery.  There is approximately 50% stenosis within the common femoral artery just proximal to the bypass graft anastomosis.  The  profundofemoral arteries widely patent.  The bypass graft is widely patent with large caliber vein graft with distal anastomosis to the above-knee popliteal artery.  The below-knee popliteal artery is widely patent with single-vessel runoff via the anterior tibial artery.  At this point I elected to proceed with intervention.  Over the wire a 6 French 45 cm sheath was placed.  A 014  [place] core wire was inserted into the vein bypass.  I selected a 4 x 60 Ranger drug-coated balloon and performed drug-coated balloon angioplasty of the common femoral artery.  This was done for 3 minutes at 8 atm.  Completion imaging showed improved opacification of the common femoral artery without evidence of dissection.  Given that there was no dissection and the lumen appeared to be widely patent although somewhat smaller caliber from the more proximal common femoral artery, I did not want to upsized the balloon due to concerns over injury to the common femoral artery.  Therefore elected to stop the procedure at this time.  The right groin was closed using a ProGlide device without complication.   Disposition: To PACU stable   V. Annamarie Major, M.D., [facility] [place] Office: [phone] Pager:  [phone]

## 2020-04-04 ENCOUNTER — Encounter (HOSPITAL_COMMUNITY): Payer: Self-pay | Admitting: Surgery

## 2020-04-22 ENCOUNTER — Other Ambulatory Visit: Payer: Self-pay

## 2020-04-22 DIAGNOSIS — N186 End stage renal disease: Secondary | ICD-10-CM

## 2020-04-22 DIAGNOSIS — I739 Peripheral vascular disease, unspecified: Secondary | ICD-10-CM

## 2020-05-05 ENCOUNTER — Ambulatory Visit (HOSPITAL_COMMUNITY)
Admission: RE | Admit: 2020-05-05 | Discharge: 2020-05-05 | Disposition: A | Discharge status: Home / Self Care | Payer: Medicare HMO | Source: Ambulatory Visit | Attending: Surgery | Admitting: Surgery

## 2020-05-05 ENCOUNTER — Other Ambulatory Visit: Payer: Self-pay

## 2020-05-05 ENCOUNTER — Ambulatory Visit (INDEPENDENT_AMBULATORY_CARE_PROVIDER_SITE_OTHER)
Admission: RE | Admit: 2020-05-05 | Discharge: 2020-05-05 | Disposition: A | Discharge status: Home / Self Care | Payer: Medicare HMO | Source: Ambulatory Visit | Attending: Surgery | Admitting: Surgery

## 2020-05-05 ENCOUNTER — Ambulatory Visit (INDEPENDENT_AMBULATORY_CARE_PROVIDER_SITE_OTHER): Payer: Self-pay | Admitting: Surgery

## 2020-05-05 ENCOUNTER — Encounter: Payer: Self-pay | Admitting: Surgery

## 2020-05-05 VITALS — BP 127/75 | HR 102 | Temp 98.3°F | Resp 20 | Ht 68.0 in | Wt 175.0 lb

## 2020-05-05 DIAGNOSIS — Z992 Dependence on renal dialysis: Secondary | ICD-10-CM

## 2020-05-05 DIAGNOSIS — I739 Peripheral vascular disease, unspecified: Secondary | ICD-10-CM

## 2020-05-05 DIAGNOSIS — N186 End stage renal disease: Secondary | ICD-10-CM | POA: Diagnosis not present

## 2020-05-05 DIAGNOSIS — I7025 Atherosclerosis of native arteries of other extremities with ulceration: Secondary | ICD-10-CM

## 2020-05-05 NOTE — Progress Notes (Signed)
[facility]  Patient name: Kristen Berry MRN: [id number] DOB: September 05, 1956 Sex: female   REASON FOR VISIT:    Follow up  HISOTRY OF PRESENT ILLNESS:    Kristen Leinweber Duffyis a 64 y.o.femalewho is status post left femoral to above-knee popliteal artery bypass graft with ipsilateral translocated nonreversed greater saphenous vein on 08/14/2019. She required return trip to the operating room for evacuation of hematoma in the groin.  On 04/03/2020, she underwent redoing the arteriovenous anastomosis.  She also had a 50% stenosis in the left common femoral artery which was treated with a drug-coated balloon.  Revision of her fistula by redoing the arteriovenous anastomosis to a more proximal portion of the artery, resecting the narrowed segment.  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 Sharol Given in April 2020.  PAST MEDICAL HISTORY:   Past Medical History:  Diagnosis Date  . Anemia   . Arthritis    "hands, knees" (10/10/2017)  . Asthma   . CHF (congestive heart failure) ([place])   . Coronary artery disease   . DVT (deep venous thrombosis) ([place]) 10/28/2017   BLE  . ESRD on hemodialysis [facility])    Dialysis M/W/F  . GERD (gastroesophageal reflux disease)   . H/O Clostridium difficile infection   . High cholesterol   . History of kidney stones   . Hypertension   . PVD (peripheral vascular disease) ([place])    "LLE; will have OR" (10/10/2017)  . Sleep apnea    "never given mask" (10/10/2017), no cpap  . Stroke [facility])    mild stroke mid April - 2019  . Type II diabetes mellitus (HCC)    diet controlled, no meds     FAMILY HISTORY:   Family History  Problem Relation Age of Onset  . Hypertension Father   . Heart failure Maternal Grandmother   . Heart failure Maternal Grandfather     SOCIAL HISTORY:   Social History   Tobacco Use  . Smoking status: Never Smoker  . Smokeless tobacco:  Never Used  Substance Use Topics  . Alcohol use: No     ALLERGIES:   Allergies  Allergen Reactions  . Crestor [Rosuvastatin Calcium] Other (See Comments)    Leg pain     CURRENT MEDICATIONS:   Current Outpatient Medications  Medication Sig Dispense Refill  . acetaminophen (TYLENOL) 500 MG tablet Take 1,500 mg by mouth every 4 (four) hours as needed (pain).     [name] albuterol (PROAIR HFA) 108 (90 Base) MCG/ACT inhaler Inhale 2 puffs into the lungs every 6 (six) hours as needed for wheezing or shortness of breath.     [name] apixaban (ELIQUIS) 2.5 MG TABS tablet Take 1 tablet (2.5 mg total) by mouth 2 (two) times daily. Restart medication on 04/04/20 60 tablet   . aspirin EC 81 MG EC tablet Take 1 tablet (81 mg total) by mouth daily. (Patient taking differently: Take 81 mg by mouth every evening. )    . atorvastatin (LIPITOR) 20 MG tablet Take 1 tablet (20 mg total) by mouth daily at 6 PM. (Patient taking differently: Take 20 mg by mouth at bedtime. ) 30 tablet 0  . Biotin 10000 MCG TBDP Take 10,000 mcg by mouth every evening.    . calcitRIOL (ROCALTROL) 0.25 MCG capsule Take 1 capsule (0.25 mcg total) by mouth every Monday, Wednesday, and Friday with hemodialysis.    [name] calcium acetate (PHOSLO) 667 MG capsule Take 667  mg by mouth with breakfast, with lunch, and with evening meal.     . carvedilol (COREG) 12.5 MG tablet Take 1 tablet (12.5 mg total) by mouth 2 (two) times daily with a meal.    . carvedilol (COREG) 25 MG tablet Take 25 mg by mouth 2 (two) times daily.    . clotrimazole-betamethasone (LOTRISONE) cream Apply 1 application topically daily as needed (rash).   0  . hydrALAZINE (APRESOLINE) 25 MG tablet Take 25 mg by mouth in the morning and at bedtime.     . hydroxyurea (HYDREA) 500 MG capsule Take 500 mg by mouth every evening.     . pantoprazole (PROTONIX) 40 MG tablet Take 40 mg by mouth at bedtime.     . pentafluoroprop-tetrafluoroeth (GEBAUERS) AERO Apply 1 application topically  as needed (topical anesthesia for hemodialysis).  0  . saccharomyces boulardii (FLORASTOR) 250 MG capsule Take 1 capsule (250 mg total) by mouth 2 (two) times daily. (Patient taking differently: Take 250 mg by mouth at bedtime. ) 20 capsule 0  . HYDROcodone-acetaminophen (NORCO/VICODIN) 5-325 MG tablet Take 1 tablet by mouth every 4 (four) hours as needed for moderate pain. (Patient not taking: Reported on 05/05/2020) 6 tablet 0   No current facility-administered medications for this visit.    REVIEW OF SYSTEMS:   [X]  denotes positive finding, [ ]  denotes negative finding Cardiac  Comments:  Chest pain or chest pressure:    Shortness of breath upon exertion:    Short of breath when lying flat:    Irregular heart rhythm:        Vascular    Pain in calf, thigh, or hip brought on by ambulation:    Pain in feet at night that wakes you up from your sleep:     Blood clot in your veins:    Leg swelling:         Pulmonary    Oxygen at home:    Productive cough:     Wheezing:         Neurologic    Sudden weakness in arms or legs:     Sudden numbness in arms or legs:     Sudden onset of difficulty speaking or slurred speech:    Temporary loss of vision in one eye:     Problems with dizziness:         Gastrointestinal    Blood in stool:     Vomited blood:         Genitourinary    Burning when urinating:     Blood in urine:        Psychiatric    Major depression:         Hematologic    Bleeding problems:    Problems with blood clotting too easily:        Skin    Rashes or ulcers:        Constitutional    Fever or chills:      PHYSICAL EXAM:   Vitals:   05/05/20 1413  BP: 127/75  Pulse: (!) 102  Resp: 20  Temp: 98.3 F (36.8 C)  SpO2: 96%  Weight: 175 lb (79.4 kg)  Height: 5\' 8"  (1.727 m)    GENERAL: The patient is a well-nourished female, in no acute distress. The vital signs are documented above. She has an excellent thrill within her fistula. The wound on  her foot has healed.  STUDIES:   I have reviewed the following studies: +-------+-----------+-----------+------------+------------+  ABI/TBIToday's  ABIToday's TBIPrevious ABIPrevious TBI  +-------+-----------+-----------+------------+------------+  Left  0.64          0.8             +-------+-----------+-----------+------------+------------+   Lower extremity: Left:  50-74% stenosis noted in the common femoral artery.  >70% stenosis noted in the proximal bypass graft.    Dialysis: OUTFLOW VEINPSV (cm/s)Diameter (cm)Depth (cm)Describe  +------------+----------+-------------+----------+--------+  Prox UA     101    0.64     0.63        +------------+----------+-------------+----------+--------+  Mid UA     186    0.85     0.89        +------------+----------+-------------+----------+--------+  Dist UA     248    0.41     0.78   narrow   +------------+----------+-------------+----------+--------+  AC [place]    995    0.32     0.99        +------------+----------+-------------+----------+--------+     MEDICAL ISSUES:   ESRD: There is an excellent thrill within her fistula.  This can be utilized at her next treatment session  PAD: Narrowing in the proximal bypass graft which was the area that was treated.  I think this represents more of a size mismatch than a true stenosis.  I will follow this up in 6 months.  I think as long as her ABIs have not changed and there is no significant velocity profile elevation this can be monitored.    Leia Alf, MD, FACS [facility] [phone] Pager [phone]

## 2020-05-07 ENCOUNTER — Other Ambulatory Visit: Payer: Self-pay | Admitting: *Deleted

## 2020-05-07 DIAGNOSIS — I739 Peripheral vascular disease, unspecified: Secondary | ICD-10-CM

## 2020-05-07 DIAGNOSIS — I7025 Atherosclerosis of native arteries of other extremities with ulceration: Secondary | ICD-10-CM

## 2020-07-22 DIAGNOSIS — D473 Essential (hemorrhagic) thrombocythemia: Secondary | ICD-10-CM | POA: Diagnosis not present

## 2020-07-22 DIAGNOSIS — Z79899 Other long term (current) drug therapy: Secondary | ICD-10-CM | POA: Diagnosis not present

## 2020-08-05 ENCOUNTER — Encounter: Payer: Self-pay | Admitting: Sports Medicine

## 2020-08-05 ENCOUNTER — Other Ambulatory Visit: Payer: Self-pay

## 2020-08-05 ENCOUNTER — Ambulatory Visit (INDEPENDENT_AMBULATORY_CARE_PROVIDER_SITE_OTHER): Payer: Medicare HMO | Admitting: Sports Medicine

## 2020-08-05 DIAGNOSIS — E1142 Type 2 diabetes mellitus with diabetic polyneuropathy: Secondary | ICD-10-CM

## 2020-08-05 DIAGNOSIS — N186 End stage renal disease: Secondary | ICD-10-CM

## 2020-08-05 DIAGNOSIS — Z89511 Acquired absence of right leg below knee: Secondary | ICD-10-CM

## 2020-08-05 DIAGNOSIS — M21962 Unspecified acquired deformity of left lower leg: Secondary | ICD-10-CM

## 2020-08-05 DIAGNOSIS — Z89432 Acquired absence of left foot: Secondary | ICD-10-CM

## 2020-08-05 DIAGNOSIS — I739 Peripheral vascular disease, unspecified: Secondary | ICD-10-CM | POA: Diagnosis not present

## 2020-08-05 DIAGNOSIS — Z992 Dependence on renal dialysis: Secondary | ICD-10-CM

## 2020-08-05 NOTE — Progress Notes (Signed)
Subjective: Kristen Berry is a 64 y.o. female patient seen in office for evaluation of chronic ulcer to the left foot.  Patient reports that her vascular surgeon did her amputation on her left foot and it took a very long time for the amputation site to heal but it has healed with no issues no open drainage reports that her nurse has discontinued care but recommended that she follow-up here.  Patient also reports that her dialysis nurse also recommended that she come here as well.  Patient denies any increase in swelling warmth drainage or any other acute signs or symptoms of infection at this time.  Patient has no other pedal complaints at this time.  Partial foot amputation was performed last year  Review of system noncontributory  Patient Active Problem List   Diagnosis Date Noted  . Gangrene of left foot ([place])   . Sepsis ([place]) 08/06/2019  . MRSA (methicillin resistant Staphylococcus aureus) 06/26/2019  . Cellulitis of left foot 06/25/2019  . Anemia in chronic kidney disease 01/29/2019  . Complete traumatic amputation at level between knee and ankle, unspecified lower leg, initial encounter ([place]) 01/12/2019  . Below knee amputation ([place])   . S/P BKA (below knee amputation), right ([place]) 01/05/2019  . ESRD on hemodialysis ([place])   . Coronary artery disease without angina pectoris   . Cellulitis 01/02/2019  . Gangrene of right foot ([place])   . Right foot infection 12/21/2018  . Unspecified open wound of right great toe without damage to nail, initial encounter 12/14/2018  . Diarrhea, unspecified 05/31/2018  . PAD (peripheral artery disease) ([place]) 01/24/2018  . Peripheral artery disease ([place]) 01/24/2018  . Anticoagulated 01/13/2018  . Rib pain 01/13/2018  . Transient cerebral ischemic attack, unspecified 01/13/2018  . C. difficile diarrhea 01/06/2018  . Hypokalemia 01/06/2018  . ESRD (end stage renal disease) ([place]) 01/06/2018  . Prolonged QT interval 01/06/2018  .  Hypotension 01/06/2018  . Multiple rib fractures 01/06/2018  . Encounter for immunization 11/22/2017  . Iron deficiency anemia, unspecified 11/21/2017  . Coagulation defect, unspecified ([place]) 11/17/2017  . 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 Hypoglycemia, unspecified 11/17/2017  . Pain, unspecified 11/17/2017  . Personal history of other venous thrombosis and embolism 11/17/2017  . Presence of other vascular implants and grafts 11/17/2017  . Pruritus, unspecified 11/17/2017  . Secondary hyperparathyroidism of renal origin ([place]) 11/17/2017  . Type 2 diabetes mellitus with diabetic neuropathy, unspecified ([place]) 11/17/2017  . Pressure injury of skin 11/08/2017  . Coronary artery disease due to lipid rich plaque   . Chest pain   . Acute on chronic systolic CHF (congestive heart failure), NYHA class 3 ([place]) 10/18/2017  . PICC (peripherally inserted central catheter) in place   . Acute osteomyelitis of toe of left foot ([place])   . Essential hypertension 12/14/2016  . Abnormal bone xray 06/15/2016  . MGUS (monoclonal gammopathy of unknown significance) 05/18/2016  . Stage 4 chronic kidney disease ([place]) 05/18/2016  . CHF (congestive heart failure) ([place]) 05/18/2016  . Renal insufficiency 06/28/2013  . Other and unspecified hyperlipidemia 06/28/2013  . Obesity, unspecified 06/28/2013  . Pain in limb 06/28/2013  . Diabetes mellitus type 2, insulin dependent ([place]) 06/28/2013   Current Outpatient Medications on File Prior to Visit  Medication Sig Dispense Refill  . acetaminophen (TYLENOL) 500 MG tablet Take 1,500 mg by mouth every 4 (four) hours  as needed (pain).     [name] albuterol (PROAIR HFA) 108 (90 Base) MCG/ACT inhaler Inhale 2 puffs into the lungs every 6 (six) hours as needed for wheezing or shortness of breath.     [name] apixaban (ELIQUIS) 2.5 MG TABS tablet  Take 1 tablet (2.5 mg total) by mouth 2 (two) times daily. Restart medication on 04/04/20 60 tablet   . aspirin EC 81 MG EC tablet Take 1 tablet (81 mg total) by mouth daily. (Patient taking differently: Take 81 mg by mouth every evening. )    . atorvastatin (LIPITOR) 20 MG tablet Take 1 tablet (20 mg total) by mouth daily at 6 PM. (Patient taking differently: Take 20 mg by mouth at bedtime. ) 30 tablet 0  . Biotin 10000 MCG TBDP Take 10,000 mcg by mouth every evening.    . calcitRIOL (ROCALTROL) 0.25 MCG capsule Take 1 capsule (0.25 mcg total) by mouth every Monday, Wednesday, and Friday with hemodialysis.    [name] calcium acetate (PHOSLO) 667 MG capsule Take 667 mg by mouth with breakfast, with lunch, and with evening meal.     . carvedilol (COREG) 12.5 MG tablet Take 1 tablet (12.5 mg total) by mouth 2 (two) times daily with a meal.    . carvedilol (COREG) 25 MG tablet Take 25 mg by mouth 2 (two) times daily.    . clotrimazole-betamethasone (LOTRISONE) cream Apply 1 application topically daily as needed (rash).   0  . hydrALAZINE (APRESOLINE) 25 MG tablet Take 25 mg by mouth in the morning and at bedtime.     [name] HYDROcodone-acetaminophen (NORCO/VICODIN) 5-325 MG tablet Take 1 tablet by mouth every 4 (four) hours as needed for moderate pain. (Patient not taking: Reported on 05/05/2020) 6 tablet 0  . hydroxyurea (HYDREA) 500 MG capsule Take 500 mg by mouth every evening.     . pantoprazole (PROTONIX) 40 MG tablet Take 40 mg by mouth at bedtime.     . pentafluoroprop-tetrafluoroeth (GEBAUERS) AERO Apply 1 application topically as needed (topical anesthesia for hemodialysis).  0  . saccharomyces boulardii (FLORASTOR) 250 MG capsule Take 1 capsule (250 mg total) by mouth 2 (two) times daily. (Patient taking differently: Take 250 mg by mouth at bedtime. ) 20 capsule 0   No current facility-administered medications on file prior to visit.   Allergies  Allergen Reactions  . Crestor [Rosuvastatin Calcium] Other  (See Comments)    Leg pain    No results found for this or any previous visit (from the past 2160 hour(s)).  Objective: There were no vitals filed for this visit.  General: Patient is awake, alert, oriented x 3 and in no acute distress.  Dermatology: Skin is warm and dry bilateral with a healed over callus at area of previous surgical site incisions/ulceration to plantar left foot.  There is no open lesion.  There is no malodor, no active drainage, no erythema, no edema. No acute signs of infection. Scar well-healed at right BKA incision site.   Vascular: Pedal pulse nonpalpable on the left.  Neurologic: Protective sensation absent on left with Semmes Weinstein monofilament.  Musculosketal: There is partial left foot amputation patient only has toes 4 5 left on the left.  Patient is also status post right AKA.  No results for input(s): GRAMSTAIN, LABORGA in the last 8760 hours.  Assessment and Plan:  Problem List Items Addressed This Visit      Cardiovascular and Mediastinum   PAD (peripheral artery disease) ([place])    Other Visit  Diagnoses    S/P below knee amputation, right (HCC)    -  Primary   Partial nontraumatic amputation of left foot (HCC)       Acquired deformity of left foot       ESRD (end stage renal disease) on dialysis ([place])       Diabetic polyneuropathy associated with type 2 diabetes mellitus ([place])           -Examined patient -Discussed the progression of the healed surgical site wound on the left foot -Advised patient to keep left foot clean and dry may use a skin moisturizer as needed sample of foot miracle cream provided -Patient to see Liliane Channel for custom molded insole for the left foot and advised patient to wait for her to get this insole and special shoe before she can attempt to walk or ambulate with her prosthetic on the right - Advised patient to go to the ER or return to office if the wound recurs or if constitutional symptoms are present. -Patient to  return to office as scheduled for follow up care and evaluation or sooner if problems arise.  Landis Martins, DPM

## 2020-09-03 ENCOUNTER — Other Ambulatory Visit: Payer: Medicare HMO | Admitting: Orthotics

## 2020-09-05 ENCOUNTER — Ambulatory Visit: Payer: Medicare HMO | Admitting: Orthotics

## 2020-09-05 ENCOUNTER — Other Ambulatory Visit: Payer: Self-pay

## 2020-09-05 DIAGNOSIS — I739 Peripheral vascular disease, unspecified: Secondary | ICD-10-CM

## 2020-09-05 DIAGNOSIS — Z89432 Acquired absence of left foot: Secondary | ICD-10-CM

## 2020-09-05 DIAGNOSIS — Z89511 Acquired absence of right leg below knee: Secondary | ICD-10-CM

## 2020-09-05 DIAGNOSIS — M21962 Unspecified acquired deformity of left lower leg: Secondary | ICD-10-CM

## 2020-09-05 NOTE — Progress Notes (Signed)
Today was cast for 1/2 ray amptuation LEFT toe filler.   Did not want shoes; will submit docs to PCP

## 2020-10-14 ENCOUNTER — Ambulatory Visit: Payer: Medicare HMO | Admitting: Sports Medicine

## 2020-10-21 ENCOUNTER — Ambulatory Visit: Payer: Medicare HMO | Admitting: Sports Medicine

## 2021-01-21 ENCOUNTER — Other Ambulatory Visit: Payer: Self-pay | Admitting: Oncology

## 2021-01-21 DIAGNOSIS — D473 Essential (hemorrhagic) thrombocythemia: Secondary | ICD-10-CM

## 2021-01-21 NOTE — Progress Notes (Signed)
Kristen Berry  [address] [place],  [place]  [address] [phone]  Clinic Day:  01/22/2021  Referring physician: Martinique, Sarah T, MD   HISTORY OF PRESENT ILLNESS:  The patient is a 65 y.o. female with essential thrombocythemia.  She comes in today to reassess her platelets.  She currently takes hydroxyurea 500 mg daily for cytoreductive purposes.  Since her last visit, the patient has been doing okay.  She denies having any clotting complications from her underlying essential thrombocythemia.   She admits there are days where she misses taking her hydroxyurea, particularly the days when she has dialysis.  She denies having any symptoms/findings which concern her for having blood clots, as it pertains to her underlying thrombocythemia.    PHYSICAL EXAM:  Blood pressure (!) 148/69, pulse 93, temperature 97.7 F (36.5 C), resp. rate 16, height 5\' 8"  (1.727 m), SpO2 100 %. Wt Readings from Last 3 Encounters:  05/05/20 175 lb (79.4 kg)  04/03/20 175 lb (79.4 kg)  03/10/20 167 lb (75.8 kg)   Body mass index is 26.61 kg/m. Performance status (ECOG): 2 Physical Exam Constitutional:      Appearance: Normal appearance. She is not ill-appearing.  HENT:     Mouth/Throat:     Mouth: Mucous membranes are moist.     Pharynx: Oropharynx is clear. No oropharyngeal exudate or posterior oropharyngeal erythema.  Cardiovascular:     Rate and Rhythm: Normal rate and regular rhythm.     Heart sounds: No murmur heard. No friction rub. No gallop.   Pulmonary:     Effort: Pulmonary effort is normal. No respiratory distress.     Breath sounds: Normal breath sounds. No wheezing, rhonchi or rales.  Chest:  Breasts:     Right: No axillary adenopathy or supraclavicular adenopathy.     Left: No axillary adenopathy or supraclavicular adenopathy.    Abdominal:     General: Bowel sounds are normal. There is no distension.     Palpations: Abdomen is soft. There is no  mass.     Tenderness: There is no abdominal tenderness.  Musculoskeletal:        General: No swelling.     Right lower leg: No edema.     Left lower leg: No edema.  Lymphadenopathy:     Cervical: No cervical adenopathy.     Upper Body:     Right upper body: No supraclavicular or axillary adenopathy.     Left upper body: No supraclavicular or axillary adenopathy.     Lower Body: No right inguinal adenopathy. No left inguinal adenopathy.  Skin:    General: Skin is warm.     Coloration: Skin is not jaundiced.     Findings: No lesion or rash.  Neurological:     General: No focal deficit present.     Mental Status: She is alert and oriented to person, place, and time. Mental status is at baseline.     Cranial Nerves: Cranial nerves are intact.  Psychiatric:        Mood and Affect: Mood normal.        Behavior: Behavior normal.        Thought Content: Thought content normal.     LABS:   CBC Latest Ref Rng & Units 01/22/2021 04/03/2020 08/29/2019  WBC - 14.6 - 12.9(H)  Hemoglobin 12.0 - 16.0 12.0 18.4(H) 9.6(L)  Hematocrit 36 - 46 38 54.0(H) 30.3(L)  Platelets 150 - 399 537(A) - 538(H)   CMP  Latest Ref Rng & Units 04/03/2020 08/29/2019 08/28/2019  Glucose 70 - 99 mg/dL 184(H) 148(H) 120(H)  BUN 8 - 23 mg/dL 47(H) 33(H) 19  Creatinine 0.44 - 1.00 mg/dL 5.00(H) 3.29(H) 2.43(H)  Sodium 135 - 145 mmol/L 135 134(L) 136  Potassium 3.5 - 5.1 mmol/L 3.8 4.6 4.3  Chloride 98 - 111 mmol/L 97(L) 98 99  CO2 22 - 32 mmol/L - 25 20(L)  Calcium 8.9 - 10.3 mg/dL - 8.8(L) 8.7(L)  Total Protein 6.5 - 8.1 g/dL - - -  Total Bilirubin 0.3 - 1.2 mg/dL - - -  Alkaline Phos 38 - 126 U/L - - -  AST 15 - 41 U/L - - -  ALT 0 - 44 U/L - - -    ASSESSMENT & PLAN:   Assessment/Plan:  A 65 y.o. female with essential thrombocythemia.  I am pleased as her platelets remain below 600.  Ultimately, the goal is to keep her platelets below 400-600.  She knows to continue taking hydroxyurea 500 mg daily for  cytoreductive purposes.  I am concerned about her compliance with this medication, particularly as her MCV level is normal.  It is usually very elevated when one is routinely taking hydroxyurea on a daily basis.  She knows to continue taking a baby aspirin on a daily basis to offset any potential clotting complications her essential thrombocythemia can cause.  As she is doing well, I will see her back in 6 months for repeat clinical assessment.  The patient understands all the plans discussed today and is in agreement with them.      [name], MD

## 2021-01-22 ENCOUNTER — Other Ambulatory Visit: Payer: Self-pay

## 2021-01-22 ENCOUNTER — Inpatient Hospital Stay: Payer: Medicare HMO | Attending: Oncology | Admitting: Oncology

## 2021-01-22 ENCOUNTER — Telehealth: Payer: Self-pay | Admitting: Oncology

## 2021-01-22 ENCOUNTER — Other Ambulatory Visit: Payer: Self-pay | Admitting: Hematology and Oncology

## 2021-01-22 ENCOUNTER — Inpatient Hospital Stay: Payer: Medicare HMO

## 2021-01-22 DIAGNOSIS — D473 Essential (hemorrhagic) thrombocythemia: Secondary | ICD-10-CM

## 2021-01-22 LAB — CBC
Absolute Lymphocytes: 0.88 (ref 0.65–4.75)
MCV: 94 (ref 81–99)
RBC: 4.05 (ref 3.87–5.11)

## 2021-01-22 LAB — CBC AND DIFFERENTIAL
HCT: 38 (ref 36–46)
Hemoglobin: 12 (ref 12.0–16.0)
Neutrophils Absolute: 12.56
Platelets: 537 — AB (ref 150–399)
WBC: 14.6

## 2021-01-22 NOTE — Telephone Encounter (Signed)
Per 4/28 los next appt scheduled and given to patient 

## 2021-03-25 ENCOUNTER — Other Ambulatory Visit: Payer: Self-pay

## 2021-03-25 DIAGNOSIS — D473 Essential (hemorrhagic) thrombocythemia: Secondary | ICD-10-CM

## 2021-03-25 DIAGNOSIS — D693 Immune thrombocytopenic purpura: Secondary | ICD-10-CM

## 2021-03-25 MED ORDER — HYDROXYUREA 500 MG PO CAPS: 500.0000 mg | ORAL_CAPSULE | Freq: Every evening | ORAL | 1 refills | Status: DC

## 2021-07-16 ENCOUNTER — Telehealth: Payer: Self-pay | Admitting: Oncology

## 2021-07-16 NOTE — Telephone Encounter (Signed)
Patient called to verify 11/1 Appt's

## 2021-07-22 NOTE — Progress Notes (Signed)
[facility]  [address] [place],  [place]  [address] [phone]  Clinic Day:  07/28/2021  Referring physician: Martinique, Sarah T, MD  This document serves as a record of services personally performed by Marice Potter, MD. It was created on their behalf by [facility] E, a trained medical scribe. The creation of this record is based on the scribe's personal observations and the provider's statements to them.  HISTORY OF PRESENT ILLNESS:  The patient is a 65 y.o. female with essential thrombocythemia.  She comes in today to reassess her platelets.  She currently takes hydroxyurea 500 mg daily for cytoreductive purposes.  Since her last visit, the patient has been doing okay.  She denies having any clotting complications from her underlying essential thrombocythemia.   She claims she has been very compliant with her daily hydroxyurea over these past months.  PHYSICAL EXAM:  Blood pressure (!) 159/86, pulse 84, temperature 97.9 F (36.6 C), resp. rate 14, height 5\' 8"  (1.727 m), SpO2 99 %. Wt Readings from Last 3 Encounters:  05/05/20 175 lb (79.4 kg)  04/03/20 175 lb (79.4 kg)  03/10/20 167 lb (75.8 kg)   Body mass index is 26.61 kg/m. Performance status (ECOG): 2 Physical Exam Constitutional:      Appearance: Normal appearance. She is not ill-appearing.  HENT:     Mouth/Throat:     Mouth: Mucous membranes are moist.     Pharynx: Oropharynx is clear. No oropharyngeal exudate or posterior oropharyngeal erythema.  Cardiovascular:     Rate and Rhythm: Normal rate and regular rhythm.     Heart sounds: No murmur heard.   No friction rub. No gallop.  Pulmonary:     Effort: Pulmonary effort is normal. No respiratory distress.     Breath sounds: Normal breath sounds. No wheezing, rhonchi or rales.  Abdominal:     General: Bowel sounds are normal. There is no distension.     Palpations: Abdomen is soft. There is no mass.     Tenderness: There is  no abdominal tenderness.  Musculoskeletal:        General: No swelling.     Right lower leg: No edema.     Left lower leg: No edema.  Lymphadenopathy:     Cervical: No cervical adenopathy.     Upper Body:     Right upper body: No supraclavicular or axillary adenopathy.     Left upper body: No supraclavicular or axillary adenopathy.     Lower Body: No right inguinal adenopathy. No left inguinal adenopathy.  Skin:    General: Skin is warm.     Coloration: Skin is not jaundiced.     Findings: No lesion or rash.  Neurological:     General: No focal deficit present.     Mental Status: She is alert and oriented to person, place, and time. Mental status is at baseline.  Psychiatric:        Mood and Affect: Mood normal.        Behavior: Behavior normal.        Thought Content: Thought content normal.    LABS:   CBC Latest Ref Rng & Units 07/28/2021 01/22/2021 04/03/2020  WBC - 10.4 14.6 -  Hemoglobin 12.0 - 16.0 12.8 12.0 18.4(H)  Hematocrit 36 - 46 38 38 54.0(H)  Platelets 150 - 399 513(A) 537(A) -   MCV:  Latest Ref Rng: 81-99 108  CMP Latest Ref Rng & Units 04/03/2020 08/29/2019 08/28/2019  Glucose 70 - 99 mg/dL 184(H) 148(H) 120(H)  BUN 8 - 23 mg/dL 47(H) 33(H) 19  Creatinine 0.44 - 1.00 mg/dL 5.00(H) 3.29(H) 2.43(H)  Sodium 135 - 145 mmol/L 135 134(L) 136  Potassium 3.5 - 5.1 mmol/L 3.8 4.6 4.3  Chloride 98 - 111 mmol/L 97(L) 98 99  CO2 22 - 32 mmol/L - 25 20(L)  Calcium 8.9 - 10.3 mg/dL - 8.8(L) 8.7(L)  Total Protein 6.5 - 8.1 g/dL - - -  Total Bilirubin 0.3 - 1.2 mg/dL - - -  Alkaline Phos 38 - 126 U/L - - -  AST 15 - 41 U/L - - -  ALT 0 - 44 U/L - - -    ASSESSMENT & PLAN:  Assessment/Plan:  A 65 y.o. female with essential thrombocythemia.  I am pleased as her platelets remain below 600.  Ultimately, the goal is to keep her platelets below 400-600.  She knows to continue taking hydroxyurea 500 mg daily for cytoreductive purposes.  She knows to continue taking a baby aspirin  on a daily basis to offset any potential clotting complications her essential thrombocythemia can cause.  As she is doing well, I will see her back in 6 months for repeat clinical assessment.  The patient understands all the plans discussed today and is in agreement with them.     I, Rita Ohara, am acting as scribe for Marice Potter, MD    I have reviewed this report as typed by the medical scribe, and it is complete and accurate.  Dequincy Macarthur Critchley, MD

## 2021-07-28 ENCOUNTER — Other Ambulatory Visit: Payer: Self-pay

## 2021-07-28 ENCOUNTER — Other Ambulatory Visit: Payer: Self-pay | Admitting: Oncology

## 2021-07-28 ENCOUNTER — Encounter: Payer: Self-pay | Admitting: Oncology

## 2021-07-28 ENCOUNTER — Telehealth: Payer: Self-pay | Admitting: Oncology

## 2021-07-28 ENCOUNTER — Inpatient Hospital Stay: Payer: Medicare HMO | Attending: Oncology | Admitting: Oncology

## 2021-07-28 ENCOUNTER — Inpatient Hospital Stay: Payer: Medicare HMO

## 2021-07-28 VITALS — BP 159/86 | HR 84 | Temp 97.9°F | Resp 14 | Ht 68.0 in

## 2021-07-28 DIAGNOSIS — D473 Essential (hemorrhagic) thrombocythemia: Secondary | ICD-10-CM

## 2021-07-28 LAB — CBC AND DIFFERENTIAL
HCT: 38 (ref 36–46)
Hemoglobin: 12.8 (ref 12.0–16.0)
Neutrophils Absolute: 8.53
Platelets: 513 — AB (ref 150–399)
WBC: 10.4

## 2021-07-28 LAB — CBC: RBC: 3.54 — AB (ref 3.87–5.11)

## 2021-07-28 NOTE — Telephone Encounter (Signed)
Per 11/1 LOS, patient scheduled May 2023 Appt's.  Gave patient Appt Summary

## 2021-10-07 ENCOUNTER — Other Ambulatory Visit: Payer: Self-pay | Admitting: Hematology and Oncology

## 2021-10-07 DIAGNOSIS — D693 Immune thrombocytopenic purpura: Secondary | ICD-10-CM

## 2022-01-25 NOTE — Progress Notes (Signed)
?Kristen Berry  ?[address] ?[place],  [name]  [address] ?[phone] ? ?Clinic Day:  01/26/2022 ? ?Referring physician: Martinique, Sarah T, MD ? ?HISTORY OF PRESENT ILLNESS:  ?The patient is a 66 y.o. female with essential thrombocythemia.  She comes in today to reassess her platelets.  She currently takes hydroxyurea 500 mg daily for cytoreductive purposes.  Since her last visit, the patient has been doing okay.  She denies having any clotting complications from her underlying essential thrombocythemia.   She claims she has been very compliant with her daily hydroxyurea over these past months. ? ?PHYSICAL EXAM:  ?Blood pressure (!) 154/110, pulse 93, temperature 97.8 ?F (36.6 ?C), resp. rate 14, height '5\' 8"'$  (1.727 m), SpO2 98 %. ?Wt Readings from Last 3 Encounters:  ?05/05/20 175 lb (79.4 kg)  ?04/03/20 175 lb (79.4 kg)  ?03/10/20 167 lb (75.8 kg)  ? ?Body mass index is 26.61 kg/m?[name] ?Performance status (ECOG): 2 ?Physical Exam ?Constitutional:   ?   Appearance: Normal appearance. She is not ill-appearing.  ?HENT:  ?   Mouth/Throat:  ?   Mouth: Mucous membranes are moist.  ?   Pharynx: Oropharynx is clear. No oropharyngeal exudate or posterior oropharyngeal erythema.  ?Cardiovascular:  ?   Rate and Rhythm: Normal rate and regular rhythm.  ?   Heart sounds: No murmur heard. ?  No friction rub. No gallop.  ?Pulmonary:  ?   Effort: Pulmonary effort is normal. No respiratory distress.  ?   Breath sounds: Normal breath sounds. No wheezing, rhonchi or rales.  ?Abdominal:  ?   General: Bowel sounds are normal. There is no distension.  ?   Palpations: Abdomen is soft. There is no mass.  ?   Tenderness: There is no abdominal tenderness.  ?Musculoskeletal:     ?   General: No swelling.  ?   Right lower leg: No edema.  ?   Left lower leg: No edema.  ?Lymphadenopathy:  ?   Cervical: No cervical adenopathy.  ?   Upper Body:  ?   Right upper body: No supraclavicular or axillary adenopathy.  ?    Left upper body: No supraclavicular or axillary adenopathy.  ?   Lower Body: No right inguinal adenopathy. No left inguinal adenopathy.  ?Skin: ?   General: Skin is warm.  ?   Coloration: Skin is not jaundiced.  ?   Findings: No lesion or rash.  ?Neurological:  ?   General: No focal deficit present.  ?   Mental Status: She is alert and oriented to person, place, and time. Mental status is at baseline.  ?Psychiatric:     ?   Mood and Affect: Mood normal.     ?   Behavior: Behavior normal.     ?   Thought Content: Thought content normal.  ? ?LABS:  ? ? ?ASSESSMENT & PLAN:  ?Assessment/Plan:  A 66 y.o. female with essential thrombocythemia.  I am pleased as her platelets remain below 600.  Ultimately, the goal is to keep her platelets below 400-600.  She knows to continue taking hydroxyurea 500 mg daily for cytoreductive purposes.  I am somewhat concerned as her white cells and hemoglobin have also risen.  This also reflects the myeloproliferative effects from her underlying essential thrombocythemia.  The decline in her MCV suggests there may be some noncompliance with her hydroxyurea, but the patient reassures me that she does not miss a single day of this agent.  For  now, her peripheral counts will continue to be followed.  She knows to continue taking a baby aspirin on a daily basis to offset any potential clotting complications her essential thrombocythemia can cause.  As she is doing well, I will see her back in 6 months for repeat clinical assessment.  The patient understands all the plans discussed today and is in agreement with them.   ? ?Tresia Revolorio Macarthur Critchley, MD ? ? ? ?  ?

## 2022-01-26 ENCOUNTER — Other Ambulatory Visit: Payer: Self-pay | Admitting: Oncology

## 2022-01-26 ENCOUNTER — Inpatient Hospital Stay (INDEPENDENT_AMBULATORY_CARE_PROVIDER_SITE_OTHER): Payer: Medicare HMO | Admitting: Oncology

## 2022-01-26 ENCOUNTER — Inpatient Hospital Stay: Payer: Medicare HMO | Attending: Oncology

## 2022-01-26 VITALS — BP 154/110 | HR 93 | Temp 97.8°F | Resp 14 | Ht 68.0 in

## 2022-01-26 DIAGNOSIS — D473 Essential (hemorrhagic) thrombocythemia: Secondary | ICD-10-CM

## 2022-01-26 LAB — CBC AND DIFFERENTIAL
HCT: 51 — AB (ref 36–46)
Hemoglobin: 16.2 — AB (ref 12.0–16.0)
Neutrophils Absolute: 13.69
Platelets: 581 10*3/uL — AB (ref 150–400)
WBC: 16.1

## 2022-01-26 LAB — CBC: RBC: 5.16 — AB (ref 3.87–5.11)

## 2022-02-18 ENCOUNTER — Other Ambulatory Visit: Payer: Self-pay | Admitting: Internal Medicine

## 2022-06-22 ENCOUNTER — Ambulatory Visit: Payer: Medicare HMO | Admitting: Podiatry

## 2022-06-29 ENCOUNTER — Ambulatory Visit (INDEPENDENT_AMBULATORY_CARE_PROVIDER_SITE_OTHER): Payer: Medicare HMO | Admitting: Podiatry

## 2022-06-29 DIAGNOSIS — M79675 Pain in left toe(s): Secondary | ICD-10-CM

## 2022-06-29 DIAGNOSIS — M79674 Pain in right toe(s): Secondary | ICD-10-CM | POA: Diagnosis not present

## 2022-06-29 DIAGNOSIS — B351 Tinea unguium: Secondary | ICD-10-CM | POA: Diagnosis not present

## 2022-06-29 DIAGNOSIS — N186 End stage renal disease: Secondary | ICD-10-CM

## 2022-06-29 DIAGNOSIS — L97521 Non-pressure chronic ulcer of other part of left foot limited to breakdown of skin: Secondary | ICD-10-CM | POA: Diagnosis not present

## 2022-06-29 DIAGNOSIS — Z89432 Acquired absence of left foot: Secondary | ICD-10-CM

## 2022-06-29 DIAGNOSIS — L84 Corns and callosities: Secondary | ICD-10-CM

## 2022-06-29 DIAGNOSIS — I739 Peripheral vascular disease, unspecified: Secondary | ICD-10-CM

## 2022-06-29 DIAGNOSIS — Z89511 Acquired absence of right leg below knee: Secondary | ICD-10-CM

## 2022-06-29 DIAGNOSIS — E1142 Type 2 diabetes mellitus with diabetic polyneuropathy: Secondary | ICD-10-CM

## 2022-06-29 DIAGNOSIS — Z992 Dependence on renal dialysis: Secondary | ICD-10-CM

## 2022-06-29 NOTE — Progress Notes (Signed)
  Subjective:  Patient ID: Kristen Berry, female    DOB: 1956-01-03,  MRN: [id number]  Chief Complaint  Patient presents with   Callouses    1 callus to left foot- bottom of the foot.     66 y.o. female presents with the above complaint. History confirmed with patient.  Patient presents the office today for evaluation of callus to the left foot as well as nail trim to 2 toes on the left foot.  She has a history of a right proximal limb amputation.  This was due to gangrene.  She also has a history of partial medial foot amputation on the left side also related to infection and gangrene.  Has not had the callus trimmed recently.  Does report small amount of bleeding from the area recently.  Objective:  Physical Exam:  nail exam thickened and dystrophic growth discoloration of the remaining nails on the left foot which are extremely elongated and incurvated as a result of length.  Extremely hyperkeratotic callus lesion at the medial aspect of the left forefoot.  Upon debridement there is a small superficial ulceration present.  See postdebridement picture of this area below no evidence of erythema or purulent drainage.  DP and PT pulses absent Left Foot: Prior partial first ray amputation Right Foot: Right above-knee amputation    Assessment:   1. Ulcer of left foot, limited to breakdown of skin ([place])   2. Pain due to onychomycosis of toenails of both feet   3. Pre-ulcerative calluses   4. S/P below knee amputation, right (HCC)   5. Partial nontraumatic amputation of left foot ([place])   6. Diabetic polyneuropathy associated with type 2 diabetes mellitus ([place])   7. PAD (peripheral artery disease) ([place])   8. ESRD (end stage renal disease) on dialysis [facility])      Plan:  Patient was evaluated and treated and all questions answered.   #Hyperkeratotic lesion at the plantar medial aspect of the left forefoot #Underlying pressure ulceration of the left plantar medial forefoot limited  to breakdown of skin no evidence of infection -All symptomatic hyperkeratoses were safely debrided with a sterile #10 blade to patient's level of comfort without incident.  Upon debridement of the callus there is noted to be a small underlying ulceration present. -Recommend daily triple antibiotic ointment and Band-Aid applied to the area as it heals then. -Avoid pressure on the area with offloading measures.  Monitor for redness swelling and drainage   Onychomycosis with pain  -Nails palliatively debrided as below. -Educated on self-care  Procedure: Nail Debridement Rationale: Pain Type of Debridement: manual, sharp debridement. Instrumentation: Nail nipper, rotary burr. Number of Nails: 2  Return in about 4 weeks (around 07/27/2022) for follow up left foot callus/superficial ulcer.         Everitt Amber, DPM [facility]

## 2022-07-22 ENCOUNTER — Other Ambulatory Visit: Payer: Self-pay | Admitting: Hematology and Oncology

## 2022-07-22 DIAGNOSIS — D693 Immune thrombocytopenic purpura: Secondary | ICD-10-CM

## 2022-07-27 ENCOUNTER — Ambulatory Visit (INDEPENDENT_AMBULATORY_CARE_PROVIDER_SITE_OTHER): Payer: Medicare HMO | Admitting: Podiatry

## 2022-07-27 DIAGNOSIS — Z992 Dependence on renal dialysis: Secondary | ICD-10-CM

## 2022-07-27 DIAGNOSIS — L97521 Non-pressure chronic ulcer of other part of left foot limited to breakdown of skin: Secondary | ICD-10-CM

## 2022-07-27 DIAGNOSIS — E1142 Type 2 diabetes mellitus with diabetic polyneuropathy: Secondary | ICD-10-CM | POA: Diagnosis not present

## 2022-07-27 DIAGNOSIS — Z89511 Acquired absence of right leg below knee: Secondary | ICD-10-CM

## 2022-07-27 DIAGNOSIS — I739 Peripheral vascular disease, unspecified: Secondary | ICD-10-CM | POA: Diagnosis not present

## 2022-07-27 DIAGNOSIS — L84 Corns and callosities: Secondary | ICD-10-CM | POA: Diagnosis not present

## 2022-07-27 DIAGNOSIS — N186 End stage renal disease: Secondary | ICD-10-CM

## 2022-07-27 NOTE — Progress Notes (Signed)
  Subjective:  Patient ID: Ledia Hanford, female    DOB: 05-Dec-1955,  MRN: [id number]  Chief Complaint  Patient presents with   Wound Check    4 week left foot callus/superficial ulcer. Patient is changing dressing daily with applying triple antibiotic on the ulcer/callus. No bleeding noted.     66 y.o. female presents for follow-up of left foot superficial ulceration at the distal medial forefoot at site of prior amputation.  She has callus present at this time.  She does think the drainage has decreased. Has been putting antibiotic ointment and Band-Aid over the area.  No concerns.  Denies any nausea vomiting fever chills.  Past Medical History:  Diagnosis Date   Anemia    Arthritis    "hands, knees" (10/10/2017)   Asthma    CHF (congestive heart failure) (HCC)    Coronary artery disease    DVT (deep venous thrombosis) ([place]) 10/28/2017   BLE   ESRD on hemodialysis (HCC)    Dialysis M/W/F   GERD (gastroesophageal reflux disease)    H/O Clostridium difficile infection    High cholesterol    History of kidney stones    Hypertension    PVD (peripheral vascular disease) ([place])    "LLE; will have OR" (10/10/2017)   Sleep apnea    "never given mask" (10/10/2017), no cpap   Stroke [facility])    mild stroke mid April - 2019   Type II diabetes mellitus ([place])    diet controlled, no meds    Allergies  Allergen Reactions   Crestor [Rosuvastatin Calcium] Other (See Comments)    Leg pain    ROS: Negative except as per HPI above  Objective:  Physical Exam:  nail exam onychomycosis but of normal length at this time hyperkeratotic callus lesion at the medial aspect of the left forefoot.  Upon debridement there is no open ulceration present, healed in from prior.  below no evidence of erythema or purulent drainage.  DP and PT pulses absent Left Foot: Prior partial first ray amputation Right Foot: Right above-knee amputation    Assessment:   1. Ulcer of left foot, limited to  breakdown of skin ([place])   2. PAD (peripheral artery disease) ([place])   3. Pre-ulcerative calluses   4. S/P below knee amputation, right ([place])   5. Diabetic polyneuropathy associated with type 2 diabetes mellitus ([place])   6. ESRD (end stage renal disease) on dialysis [facility])      Plan:  Patient was evaluated and treated and all questions answered.  #Ulceration of the left medial forefoot limited to breakdown of skin.#Preulcerative calluses -Discussed with the patient that her prior small superficial ulceration appears to be well-healed after wound care.  I recommend continued monitoring of the area. -I lightly abraded the overlying hyperkeratotic tissue over the left medial forefoot at site of prior wound and there is no wound present underlying.  No drainage no erythema no malodor no signs of infection present. -Recommend she continue use bandage to cover the area for approximately 1-2 more weeks. -Patient will follow-up in approximately 2 months for ongoing routine foot care.  Return in about 2 months (around 09/26/2022).          Everitt Amber, DPM [facility]

## 2022-08-02 NOTE — Progress Notes (Unsigned)
[facility]  [address] [place],  [place]  [address] [phone]  Clinic Day:  08/03/2022  Referring physician: Martinique, Sarah T, MD  HISTORY OF PRESENT ILLNESS:  The patient is a 66 y.o. female with essential thrombocythemia.  She comes in today to reassess her platelets.  She currently takes hydroxyurea 500 mg daily for cytoreductive purposes.  Since her last visit, the patient has been doing okay.  She denies having any clotting complications from her underlying essential thrombocythemia.     PHYSICAL EXAM:  Blood pressure (!) 196/82, pulse 92, temperature 97.8 F (36.6 C), resp. rate 16, height '5\' 8"'$  (1.727 m), SpO2 100 %. Wt Readings from Last 3 Encounters:  05/05/20 175 lb (79.4 kg)  04/03/20 175 lb (79.4 kg)  03/10/20 167 lb (75.8 kg)   Body mass index is 26.61 kg/m. Performance status (ECOG): 2 Physical Exam Constitutional:      Appearance: Normal appearance. She is not ill-appearing.  HENT:     Mouth/Throat:     Mouth: Mucous membranes are moist.     Pharynx: Oropharynx is clear. No oropharyngeal exudate or posterior oropharyngeal erythema.  Cardiovascular:     Rate and Rhythm: Normal rate and regular rhythm.     Heart sounds: No murmur heard.    No friction rub. No gallop.  Pulmonary:     Effort: Pulmonary effort is normal. No respiratory distress.     Breath sounds: Normal breath sounds. No wheezing, rhonchi or rales.  Abdominal:     General: Bowel sounds are normal. There is no distension.     Palpations: Abdomen is soft. There is no mass.     Tenderness: There is no abdominal tenderness.  Musculoskeletal:        General: No swelling.     Right lower leg: No edema.     Left lower leg: No edema.  Lymphadenopathy:     Cervical: No cervical adenopathy.     Upper Body:     Right upper body: No supraclavicular or axillary adenopathy.     Left upper body: No supraclavicular or axillary adenopathy.     Lower Body: No right  inguinal adenopathy. No left inguinal adenopathy.  Skin:    General: Skin is warm.     Coloration: Skin is not jaundiced.     Findings: No lesion or rash.  Neurological:     General: No focal deficit present.     Mental Status: She is alert and oriented to person, place, and time. Mental status is at baseline.  Psychiatric:        Mood and Affect: Mood normal.        Behavior: Behavior normal.        Thought Content: Thought content normal.    LABS:    Latest Reference Range & Units 08/03/22 09:57  WBC 4.0 - 10.5 K/uL 12.4 (H)  RBC 3.87 - 5.11 MIL/uL 3.38 (L)  Hemoglobin 12.0 - 15.0 g/dL 12.5  HCT 36.0 - 46.0 % 39.8  MCV 80.0 - 100.0 fL 117.8 (H)  MCH 26.0 - 34.0 pg 37.0 (H)  MCHC 30.0 - 36.0 g/dL 31.4  RDW 11.5 - 15.5 % 16.5 (H)  Platelets 150 - 400 K/uL 580 (H)  nRBC 0.0 - 0.2 % 0.0  Neutrophils % 81  Lymphocytes % 9  Monocytes Relative % 5  Eosinophil % 3  Basophil % 1  Immature Granulocytes % 1  (H): Data is abnormally high (L): Data is  abnormally low  ASSESSMENT & PLAN:  Assessment/Plan:  A 66 y.o. female with essential thrombocythemia.  I am pleased as her platelets remain below 600.  Ultimately, the goal is to keep her platelets below 400-600.  She knows to continue taking hydroxyurea 500 mg daily for cytoreductive purposes. She knows to continue taking a baby aspirin on a daily basis to offset any potential clotting complications her essential thrombocythemia can cause.  As she is doing well, I will see her back in 6 months for repeat clinical assessment.  The patient understands all the plans discussed today and is in agreement with them.    Maryagnes Carrasco Macarthur Critchley, MD

## 2022-08-03 ENCOUNTER — Other Ambulatory Visit: Payer: Self-pay

## 2022-08-03 ENCOUNTER — Inpatient Hospital Stay: Payer: Medicare HMO | Attending: Oncology | Admitting: Oncology

## 2022-08-03 ENCOUNTER — Telehealth: Payer: Self-pay

## 2022-08-03 ENCOUNTER — Inpatient Hospital Stay: Payer: Medicare HMO

## 2022-08-03 VITALS — BP 196/82 | HR 92 | Temp 97.8°F | Resp 16 | Ht 68.0 in

## 2022-08-03 DIAGNOSIS — D473 Essential (hemorrhagic) thrombocythemia: Secondary | ICD-10-CM | POA: Insufficient documentation

## 2022-08-03 DIAGNOSIS — D75839 Thrombocytosis, unspecified: Secondary | ICD-10-CM | POA: Diagnosis present

## 2022-08-03 LAB — CBC WITH DIFFERENTIAL (CANCER CENTER ONLY)
Abs Immature Granulocytes: 0.09 10*3/uL — ABNORMAL HIGH (ref 0.00–0.07)
Basophils Absolute: 0.2 10*3/uL — ABNORMAL HIGH (ref 0.0–0.1)
Basophils Relative: 1 %
Eosinophils Absolute: 0.4 10*3/uL (ref 0.0–0.5)
Eosinophils Relative: 3 %
HCT: 39.8 % (ref 36.0–46.0)
Hemoglobin: 12.5 g/dL (ref 12.0–15.0)
Immature Granulocytes: 1 %
Lymphocytes Relative: 9 %
Lymphs Abs: 1.1 10*3/uL (ref 0.7–4.0)
MCH: 37 pg — ABNORMAL HIGH (ref 26.0–34.0)
MCHC: 31.4 g/dL (ref 30.0–36.0)
MCV: 117.8 fL — ABNORMAL HIGH (ref 80.0–100.0)
Monocytes Absolute: 0.6 10*3/uL (ref 0.1–1.0)
Monocytes Relative: 5 %
Neutro Abs: 10.1 10*3/uL — ABNORMAL HIGH (ref 1.7–7.7)
Neutrophils Relative %: 81 %
Platelet Count: 580 10*3/uL — ABNORMAL HIGH (ref 150–400)
RBC: 3.38 MIL/uL — ABNORMAL LOW (ref 3.87–5.11)
RDW: 16.5 % — ABNORMAL HIGH (ref 11.5–15.5)
WBC Count: 12.4 10*3/uL — ABNORMAL HIGH (ref 4.0–10.5)
nRBC: 0 % (ref 0.0–0.2)

## 2022-08-03 LAB — CMP (CANCER CENTER ONLY)
ALT: 6 U/L (ref 0–44)
AST: 11 U/L — ABNORMAL LOW (ref 15–41)
Albumin: 3.7 g/dL (ref 3.5–5.0)
Alkaline Phosphatase: 105 U/L (ref 38–126)
Anion gap: 18 — ABNORMAL HIGH (ref 5–15)
BUN: 29 mg/dL — ABNORMAL HIGH (ref 8–23)
CO2: 26 mmol/L (ref 22–32)
Calcium: 9.1 mg/dL (ref 8.9–10.3)
Chloride: 96 mmol/L — ABNORMAL LOW (ref 98–111)
Creatinine: 2.94 mg/dL — ABNORMAL HIGH (ref 0.44–1.00)
GFR, Estimated: 17 mL/min — ABNORMAL LOW (ref >60)
Glucose, Bld: 96 mg/dL (ref 70–99)
Potassium: 3.9 mmol/L (ref 3.5–5.1)
Sodium: 140 mmol/L (ref 135–145)
Total Bilirubin: 0.8 mg/dL (ref 0.3–1.2)
Total Protein: 7.5 g/dL (ref 6.5–8.1)

## 2022-08-03 NOTE — Telephone Encounter (Signed)
Dr Bobby Rumpf reviewed labs and states that things are stable. Have pt to continue medication as is. Pt notified and verbalized understanding.   Latest Reference Range & Units 08/03/22 09:57  WBC 4.0 - 10.5 K/uL 12.4 (H)  RBC 3.87 - 5.11 MIL/uL 3.38 (L)  Hemoglobin 12.0 - 15.0 g/dL 12.5  HCT 36.0 - 46.0 % 39.8  MCV 80.0 - 100.0 fL 117.8 (H)  MCH 26.0 - 34.0 pg 37.0 (H)  MCHC 30.0 - 36.0 g/dL 31.4  RDW 11.5 - 15.5 % 16.5 (H)  Platelets 150 - 400 K/uL 580 (H)  nRBC 0.0 - 0.2 % 0.0  (H): Data is abnormally high (L): Data is abnormally low

## 2022-10-05 ENCOUNTER — Ambulatory Visit: Payer: Medicare HMO | Admitting: Podiatry

## 2022-10-28 DEATH — deceased

## 2023-02-01 ENCOUNTER — Other Ambulatory Visit: Payer: Medicare HMO

## 2023-02-02 ENCOUNTER — Telehealth: Payer: Medicare HMO | Admitting: Oncology
